# Patient Record
Sex: Female | Born: 1950
Health system: Southern US, Community
[De-identification: ages and names within clinical notes are randomized; demographics above are authoritative.]

## PROBLEM LIST (undated history)

## (undated) DIAGNOSIS — K429 Umbilical hernia without obstruction or gangrene: Secondary | ICD-10-CM

## (undated) DIAGNOSIS — M21372 Foot drop, left foot: Secondary | ICD-10-CM

## (undated) DIAGNOSIS — I671 Cerebral aneurysm, nonruptured: Secondary | ICD-10-CM

## (undated) DIAGNOSIS — Z5189 Encounter for other specified aftercare: Secondary | ICD-10-CM

## (undated) DIAGNOSIS — M1990 Unspecified osteoarthritis, unspecified site: Secondary | ICD-10-CM

## (undated) DIAGNOSIS — Z992 Dependence on renal dialysis: Secondary | ICD-10-CM

## (undated) DIAGNOSIS — K559 Vascular disorder of intestine, unspecified: Secondary | ICD-10-CM

## (undated) DIAGNOSIS — I350 Nonrheumatic aortic (valve) stenosis: Secondary | ICD-10-CM

## (undated) DIAGNOSIS — R34 Anuria and oliguria: Secondary | ICD-10-CM

## (undated) DIAGNOSIS — M48061 Spinal stenosis, lumbar region without neurogenic claudication: Secondary | ICD-10-CM

## (undated) DIAGNOSIS — B1920 Unspecified viral hepatitis C without hepatic coma: Secondary | ICD-10-CM

## (undated) DIAGNOSIS — D649 Anemia, unspecified: Secondary | ICD-10-CM

## (undated) DIAGNOSIS — R569 Unspecified convulsions: Secondary | ICD-10-CM

## (undated) DIAGNOSIS — N186 End stage renal disease: Secondary | ICD-10-CM

## (undated) DIAGNOSIS — I499 Cardiac arrhythmia, unspecified: Secondary | ICD-10-CM

## (undated) DIAGNOSIS — I10 Essential (primary) hypertension: Secondary | ICD-10-CM

## (undated) DIAGNOSIS — R011 Cardiac murmur, unspecified: Secondary | ICD-10-CM

## (undated) DIAGNOSIS — M21371 Foot drop, right foot: Secondary | ICD-10-CM

## (undated) HISTORY — PX: UMBILICAL HERNIA REPAIR: SUR1181

## (undated) HISTORY — DX: Nonrheumatic aortic (valve) stenosis: I35.0

## (undated) HISTORY — PX: VASCULAR SURGERY: SHX849

## (undated) HISTORY — PX: TONSILLECTOMY: SUR1361

## (undated) HISTORY — PX: RIB RESECTION: SHX5077

---
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
## 1988-11-16 DIAGNOSIS — Z5189 Encounter for other specified aftercare: Secondary | ICD-10-CM

## 1988-11-16 DIAGNOSIS — IMO0001 Reserved for inherently not codable concepts without codable children: Secondary | ICD-10-CM

## 1988-11-16 HISTORY — DX: Reserved for inherently not codable concepts without codable children: IMO0001

## 1988-11-16 HISTORY — DX: Encounter for other specified aftercare: Z51.89

## 1997-10-03 ENCOUNTER — Ambulatory Visit (HOSPITAL_COMMUNITY): Admission: RE | Admit: 1997-10-03 | Discharge: 1997-10-03 | Payer: Self-pay | Admitting: Thoracic Surgery

## 1998-02-03 DIAGNOSIS — I340 Nonrheumatic mitral (valve) insufficiency: Secondary | ICD-10-CM | POA: Insufficient documentation

## 1998-03-24 ENCOUNTER — Other Ambulatory Visit: Admission: RE | Admit: 1998-03-24 | Discharge: 1998-03-24 | Payer: Self-pay | Admitting: Nephrology

## 1999-09-06 DIAGNOSIS — M51379 Other intervertebral disc degeneration, lumbosacral region without mention of lumbar back pain or lower extremity pain: Secondary | ICD-10-CM | POA: Insufficient documentation

## 2000-03-03 ENCOUNTER — Encounter: Admission: RE | Admit: 2000-03-03 | Discharge: 2000-03-03 | Payer: Self-pay | Admitting: Nephrology

## 2000-03-03 ENCOUNTER — Encounter: Payer: Self-pay | Admitting: Nephrology

## 2000-03-17 DIAGNOSIS — Z7721 Contact with and (suspected) exposure to potentially hazardous body fluids: Secondary | ICD-10-CM | POA: Insufficient documentation

## 2000-03-19 ENCOUNTER — Encounter: Payer: Self-pay | Admitting: Thoracic Surgery

## 2000-03-19 ENCOUNTER — Encounter (INDEPENDENT_AMBULATORY_CARE_PROVIDER_SITE_OTHER): Payer: Self-pay | Admitting: Specialist

## 2000-03-20 ENCOUNTER — Inpatient Hospital Stay (HOSPITAL_COMMUNITY): Admission: RE | Admit: 2000-03-20 | Discharge: 2000-03-22 | Payer: Self-pay | Admitting: Thoracic Surgery

## 2000-03-25 ENCOUNTER — Emergency Department (HOSPITAL_COMMUNITY): Admission: EM | Admit: 2000-03-25 | Discharge: 2000-03-25 | Payer: Self-pay | Admitting: Emergency Medicine

## 2000-03-25 ENCOUNTER — Ambulatory Visit (HOSPITAL_COMMUNITY): Admission: RE | Admit: 2000-03-25 | Discharge: 2000-03-25 | Payer: Self-pay | Admitting: Nephrology

## 2000-03-26 ENCOUNTER — Encounter: Payer: Self-pay | Admitting: Nephrology

## 2000-03-26 ENCOUNTER — Emergency Department (HOSPITAL_COMMUNITY): Admission: EM | Admit: 2000-03-26 | Discharge: 2000-03-26 | Payer: Self-pay | Admitting: Emergency Medicine

## 2000-03-26 ENCOUNTER — Ambulatory Visit (HOSPITAL_COMMUNITY): Admission: RE | Admit: 2000-03-26 | Discharge: 2000-03-26 | Payer: Self-pay | Admitting: Nephrology

## 2000-05-05 ENCOUNTER — Ambulatory Visit (HOSPITAL_COMMUNITY): Admission: RE | Admit: 2000-05-05 | Discharge: 2000-05-05 | Payer: Self-pay | Admitting: *Deleted

## 2001-01-16 ENCOUNTER — Inpatient Hospital Stay (HOSPITAL_COMMUNITY): Admission: RE | Admit: 2001-01-16 | Discharge: 2001-01-19 | Payer: Self-pay | Admitting: Surgery

## 2001-01-16 ENCOUNTER — Encounter: Payer: Self-pay | Admitting: Surgery

## 2001-01-17 HISTORY — PX: OTHER SURGICAL HISTORY: SHX169

## 2002-06-23 ENCOUNTER — Ambulatory Visit (HOSPITAL_COMMUNITY): Admission: RE | Admit: 2002-06-23 | Discharge: 2002-06-23 | Payer: Self-pay | Admitting: Nephrology

## 2002-06-23 ENCOUNTER — Encounter: Payer: Self-pay | Admitting: Nephrology

## 2003-11-11 ENCOUNTER — Encounter: Admission: RE | Admit: 2003-11-11 | Discharge: 2003-11-11 | Payer: Self-pay | Admitting: Nephrology

## 2004-01-09 ENCOUNTER — Ambulatory Visit (HOSPITAL_COMMUNITY): Admission: RE | Admit: 2004-01-09 | Discharge: 2004-01-09 | Payer: Self-pay | Admitting: Nephrology

## 2004-05-25 ENCOUNTER — Other Ambulatory Visit: Admission: RE | Admit: 2004-05-25 | Discharge: 2004-05-25 | Payer: Self-pay | Admitting: Obstetrics and Gynecology

## 2004-10-24 ENCOUNTER — Ambulatory Visit (HOSPITAL_COMMUNITY): Admission: RE | Admit: 2004-10-24 | Discharge: 2004-10-24 | Payer: Self-pay | Admitting: Nephrology

## 2004-11-23 ENCOUNTER — Ambulatory Visit (HOSPITAL_COMMUNITY): Admission: RE | Admit: 2004-11-23 | Discharge: 2004-11-23 | Payer: Self-pay | Admitting: Thoracic Surgery

## 2004-12-17 ENCOUNTER — Ambulatory Visit (HOSPITAL_COMMUNITY): Admission: RE | Admit: 2004-12-17 | Discharge: 2004-12-17 | Payer: Self-pay | Admitting: Vascular Surgery

## 2005-11-15 ENCOUNTER — Ambulatory Visit (HOSPITAL_COMMUNITY): Admission: RE | Admit: 2005-11-15 | Discharge: 2005-11-15 | Payer: Self-pay | Admitting: Nephrology

## 2005-11-20 ENCOUNTER — Ambulatory Visit (HOSPITAL_COMMUNITY): Admission: RE | Admit: 2005-11-20 | Discharge: 2005-11-20 | Payer: Self-pay | Admitting: Nephrology

## 2006-02-19 DIAGNOSIS — E119 Type 2 diabetes mellitus without complications: Secondary | ICD-10-CM | POA: Insufficient documentation

## 2006-08-08 ENCOUNTER — Encounter: Admission: RE | Admit: 2006-08-08 | Discharge: 2006-08-08 | Payer: Self-pay | Admitting: Nephrology

## 2007-03-27 ENCOUNTER — Encounter: Admission: RE | Admit: 2007-03-27 | Discharge: 2007-04-24 | Payer: Self-pay | Admitting: Nephrology

## 2007-04-25 ENCOUNTER — Emergency Department (HOSPITAL_COMMUNITY): Admission: EM | Admit: 2007-04-25 | Discharge: 2007-04-25 | Payer: Self-pay | Admitting: Emergency Medicine

## 2007-09-21 ENCOUNTER — Ambulatory Visit (HOSPITAL_COMMUNITY): Admission: RE | Admit: 2007-09-21 | Discharge: 2007-09-21 | Payer: Self-pay | Admitting: Nephrology

## 2008-03-18 HISTORY — PX: NEPHRECTOMY: SHX65

## 2008-06-21 DIAGNOSIS — Z905 Acquired absence of kidney: Secondary | ICD-10-CM | POA: Insufficient documentation

## 2009-03-17 DIAGNOSIS — Z8701 Personal history of pneumonia (recurrent): Secondary | ICD-10-CM | POA: Insufficient documentation

## 2009-06-30 ENCOUNTER — Ambulatory Visit: Payer: Self-pay | Admitting: Vascular Surgery

## 2009-08-25 ENCOUNTER — Ambulatory Visit: Payer: Self-pay | Admitting: Vascular Surgery

## 2009-09-04 ENCOUNTER — Encounter: Payer: Self-pay | Admitting: Cardiology

## 2009-09-27 ENCOUNTER — Encounter: Payer: Self-pay | Admitting: Cardiology

## 2010-04-08 ENCOUNTER — Encounter: Payer: Self-pay | Admitting: Obstetrics and Gynecology

## 2010-04-08 ENCOUNTER — Encounter: Payer: Self-pay | Admitting: Nephrology

## 2010-07-31 NOTE — Consult Note (Signed)
NEW PATIENT CONSULTATION   [name], [name]  DOB:  1950-11-19                                       08/25/2009  [id number]   The patient presents today for evaluation of potential claudication for  bilateral lower extremity arterial insufficiency.  She is a very  pleasant 60 year old black female with a 20-year history of end-stage  renal disease on hemodialysis.  She has a right upper arm AV graft that  has been functional for many years.  She reports pain in mainly her left  calf extending occasionally up into her thigh.  She reports this is  continuous and is not related to walking.  She does not have any tissue  loss or rest pain.   PAST MEDICAL HISTORY:  Significant for end-stage renal disease.  She  denies any prior cardiac difficulties.   SOCIAL HISTORY:  She is married with two children.  She does not smoke,  having quit in March 2011.  She does not drink alcohol.   Past medical history is well documented in her problem list through  [facility] and I have reviewed this with her.   REVIEW OF SYSTEMS:  Positive for pain in her legs with walking and  sitting still, arthritis, otherwise negative.   PHYSICAL EXAMINATION:  Blood pressure  98.62, pulse 102, respirations  18,  her temperature is 98.0.  She is in no acute distress.  HEENT:  Normal.  Chest:  Clear bilateral.  Heart:  Regular rate and rhythm.  Her  radial pulses are 2+.  She does have a good thrill her right upper arm  AV graft.  She has palpable femoral pulses, palpable popliteal and she  does have palpable 1 to 2+ posterior tibial pulses bilaterally. Her feet  are well-perfused and warm with no evidence of tissue.   She had undergone noninvasive vasculature laboratory studies in our  office on 06/30/2009.  This showed calcified vessels but does have  biphasic waveforms.   I discussed this at length with the patient.  I do not feel she has any  evidence of lower  extremity arterial insufficiency.  She does have known  degenerative disk disease and this may be the cause of her lower  extremity discomfort.  She is reassured of this and will see [place]  again  on an as-needed basis.     Rosetta Posner, M.D.  Electronically Signed   TFE/MEDQ  D:  08/25/2009  T:  08/28/2009  Job:  [id number]   cc:   Donato Heinz, M.D.

## 2010-08-03 NOTE — Op Note (Signed)
[place]. [facility]  Patient:    [name], [name]                     MRN: [id number] Proc. Date: 03/19/00 Adm. Date:  [id number] Attending:  Mollie Germany CC:         Pierre Bali, M.D. (2)  Joyice Faster. Deterding, M.D.   Operative Report  PREOPERATIVE DIAGNOSIS:  Right upper arm graft with a false aneurysm of the graft.  POSTOPERATIVE DIAGNOSIS:  Right upper arm graft with a false aneurysm of graft.  OPERATION: 1. Excision of false aneurysm, and insertion of a 6.0 mm Gore-Tex segment. 2. Insertion of a left subclavian dialysis catheter.  SURGEON:  D. Marlyn Corporal, M.D.  ASSISTANT:  Lestine Box, C.R.N.F.A.  ANESTHESIA:  Xylocaine 1% and IV sedation.  DESCRIPTION OF PROCEDURE:  After prepping and draping the right arm an area was infiltrated with 1% Xylocaine above and below the right upper arm aneurysm.  The patient had two aneurysms, one 2.0 cm x 3.0 cm, and the other one 1.5 cm x 3.0 cm x 3.0 cm in length.  They were in the midportion of the graft.  An area was infiltrated with 1% Xylocaine proximal to the aneurysm and another area distal to the aneurysm.  A longitudinal incision is made and the dissection is carried down through the subcutaneous tissue to the graft proximal to the aneurysm.  It was looped with a vascular tape, and distally it was also dissected down to the graft and looped with the vascular tape.  Then the aneurysms were dissected out both proximally and distally, dissecting both aneurysms out from the subcutaneous tissue.  The patient was given 5000 units of heparin.  The graft was clamped proximally and distally.  The graft was divided tangentially proximally and distally, and then the aneurysm was resected out from the subcutaneous tissue.  After this had been done, a 6.0 mm Gore-Tex graft was inserted into the track just inferior to the previous track, and was sutured end-to-end with #5-0 Prolene in a  running continuous fashion both proximally and distally, and tying down superiorly.  All clamps were removed, and there was good flow through the graft.  Some excess skin that was excised, and then the graft was closed with running #3-0 Vicryl and interrupted #2-0 nylon.  The left chest was then prepped and draped in the usual manner.  An area was infiltrated with 1% Xylocaine at the left subclavian, and a left subclavian puncture was performed, and a guide wire was threaded.  Over the guide wire was passed a dilator, a 12-French, and then over the guide wire was passed the 12-French dialysis catheter.  It was sutured in place with #3-0 silk, and a dressing was applied.  It was flushed with heparin.  The patient was then returned to the recovery room in stable condition. DD:  03/19/00 TD:  03/19/00 Job: [id number]

## 2010-08-03 NOTE — Discharge Summary (Signed)
[place]. [facility]  Patient:    [name], [name]                     MRN: [id number] Adm. Date:  [id number] Disc. Date: [id number] Attending:  Mollie Germany Dictator:   Lestine Box, RNFA                           Discharge Summary  DATE OF BIRTH:  May 27, 1950  DATE OF SURGERY:  March 19, 2000  ADMISSION DIAGNOSIS:  Aneurysmal arteriovenous Gore-Tex graft, right upper arm.  PAST MEDICAL HISTORY: 1. End-stage renal disease:  She has hemodialysis on a Tuesday, Thursday,    Saturday schedule. 2. Hypertension.  ALLERGIES:  CONTRAST DYE causes seizures.  DISCHARGE DIAGNOSES: 1. Aneurysmal arteriovenous Gore-Tex graft, right upper arm. 2. Postoperative bleeding. 3. Postoperative hypertension.  HOSPITAL COURSE:  On March 19, 2000, [name] came to [facility] for her scheduled revision of her right upper arm AV Gore-Tex graft. She has an excision of the aneurysmal segment and revision of the graft with a 6-mm Gore-Tex graft by Dr. Pierre Bali.  At the conclusion of the procedure she was transferred in stable condition to the PACU. Postoperatively, she developed bleeding at the operative site.  Pressure dressing was applied with resolution of bleeding.  She was admitted to [facility] for observation overnight.  She had no further bleeding, and the right AV Gore-Tex graft remains patent.  On January 3, prior to planned discharge, she was noted to be orthostatic in her blood pressure DD:  03/21/00 TD:  03/21/00 Job: [id number]

## 2010-08-03 NOTE — Discharge Summary (Signed)
. [facility]  Patient:    ARDALIA, NEUMEISTER                     MRN: [id number] Adm. Date:  [id number] Disc. Date: [id number] Attending:  Mollie Germany Dictator:   Lestine Box, RNFA CC:         Darrold Span. Florene Glen, M.D.  [facility]   Discharge Summary  DATE OF BIRTH:  May 19, 1950  DATE OF SURGERY:  March 19, 2000  ADMISSION DIAGNOSIS:  Aneurysmal arteriovenous Gore-Tex graft, right upper arm.  PAST MEDICAL HISTORY: 1. End-stage renal disease:  She has hemodialysis on a Tuesday, Thursday,    Saturday schedule. 2. Hypertension.  ALLERGIES:  CONTRAST DYE causes seizures.  DISCHARGE DIAGNOSES: 1. Aneurysmal arteriovenous Gore-Tex graft, right upper arm. 2. Postoperative bleeding. 3. Postoperative hypertension.  HOSPITAL COURSE:  On March 19, 2000, Ms. Malave came to [facility] for a planned revision of her right upper arm AV graft.  She underwent an excision of the aneurysmal segment and revision of the graft with a piece of 6-mm Gore-Tex graft by Dr. Pierre Bali.  At the conclusion of the procedure she was transferred in stable condition to the PACU. Postoperatively, she developed some bleeding at the operative site.  Pressure dressing was applied with resolution.  She was admitted to [facility] overnight for observation.  She had no further bleeding from the operative site and her right AV Gore-Tex graft remained patent.  On the morning of January 3 prior to her planned discharge, she was noted to be orthostatic with orthostatic hypertension.  Her hemoglobin was measured at 8.8 that was down from 11.0 preoperatively.  Her blood pressure meds were held for the day, and she was given a 250 cc IV administration of normal saline.  Her blood pressure improved after the saline administration and has remained in the 100-110/62-68 range.  Today, January 4, she is currently at  hemodialysis.  If she remains nonorthostatic, she will be discharged home after her hemodialysis treatment.   CONDITION ON DISCHARGE:  Her condition is improved.  DISCHARGE INSTRUCTIONS:  Ms. Penado has been instructed regarding activity, diet, medications, follow-up, and wound care.  MEDICATIONS ON DISCHARGE: 1. She has been instructed to resume her home medications. 2. She has also been given a prescription for Tylox that she can take one to    two p.o. q.4-6h. p.r.n. pain.  DISCHARGE FOLLOWUP: 1. She is to resume her usual hemodialysis treatments. 2. She also has an appointment to see Dr. Arlyce Dice at the [place] office on    January 9 at 2 p.m. DD:  03/21/00 TD:  03/21/00 Job: [id number]

## 2010-08-03 NOTE — Op Note (Signed)
[name], [name]              ACCOUNT NO.:  [id number]   MEDICAL RECORD NO.:  [id number]          PATIENT TYPE:  AMB   LOCATION:  SDS                          FACILITY:  [facility]   PHYSICIAN:  Nicanor Alcon, M.D. DATE OF BIRTH:  [birth year]   DATE OF PROCEDURE:  11/23/2004  DATE OF DISCHARGE:                                 OPERATIVE REPORT   PREOPERATIVE DIAGNOSES:  1.  End-stage renal disease.  2.  Aneurysms.  3.  Right upper arm arteriovenous Gore-Tex graft.   POSTOPERATIVE DIAGNOSES:  1.  End-stage renal disease.  2.  Aneurysms.  3.  Right upper arm arteriovenous Gore-Tex graft.   OPERATION/PROCEDURE:  1.  Insertion of left internal jugular catheter.  2.  Exclusion of right upper arm graft arteriovenous aneurysms with revision      of right upper arm arteriovenous Gore-Tex graft.   SURGEON:  Nicanor Alcon, M.D.   FIRST ASSISTANT:  Remo Lipps, R.N.F.A.   DESCRIPTION OF PROCEDURE:  After adequate general anesthesia, the patient  was prepped and draped in the usual sterile manner.  A left IJ puncture was  performed and a guidewire threaded under fluoroscopic guidance to the right  atrium.  Over the guidewire was passed 12, 14 and 16 dilators.  The 16  dilator had a peel-away sheath but we could not insert the peel-away sheath  more than about 2-3 cm into the vein.  The dilator was removed and the  guidewire was weaved through the Diatek to use a guide.  The Diatek was  inserted through the peel-away sheath and inserted down to the right atrium.  The guidewire was removed.  The peel-away sheath was removed and it flushed  and withdrew easily.  It was then another stab wound was made inferiorly.  We had to use a 32 cm catheter so we had to do a long tunnel.  The long  tunnel was done with the cuff being about halfway in between and then it was  brought out through the separate stab wound and the connecting device  applied to the catheter.  The catheter was  flushed appropriately.  The  catheter was sutured in place with 3-0 nylon, both at the suture cuff and  then at the proximal stab wound was sutured.   Next, the right arm was prepped and draped in the usual sterile manner.  An  incision was made near the arterial end and the graft was dissected out.  The patient had multiple aneurysms in the mid portion of the graft.  The  graft was then looped with the vascular tape and another incision was made  at the venous end.  The venous end was dissected out and looped with the  vascular tape.  The patient was given 4000 units of heparin.  The proximal  graft was then clamped, cut tangentially and about 2-3 cm resected draining  all the blood out of the aneurysm.  A 20 cm 6 mm graft had been tunneled  around the aneurysms prior to giving the heparin and this was cut  tangentially and sutured end-to-side with  6-0 Prolene in a running  continuous fashion.  All clamps were removed and there was good flow.  There  was good pulsation in the graft. The  graft was clamped distally, measured  appropriately distally.  Distally the old graft was divided and about 3 cm  resected, and then an end-to-end anastomosis was performed between the new  graft and the old graft with 6-0 Prolene in a running continuous fashion.  Clamps were removed.  There was good flow through the graft. Wounds were  closed with 3-0 Vicryl and Dermabond for the skin.  The patient was returned  to the recovery room in stable condition.           ______________________________  Nicanor Alcon, M.D.     DPB/MEDQ  D:  11/23/2004  T:  11/23/2004  Job:  [id number]

## 2010-08-03 NOTE — Discharge Summary (Signed)
[place]. [facility]  Patient:    [name], [name] Visit Number: [id number] MRN: [id number]          Service Type: SUR Location: [id number] Attending Physician:  Gomez Cleverly Dictated by:   Earnstine Regal, M.D. Admit Date:  01/16/2001 Discharge Date: 01/19/2001   CC:         [name], M.D.   Discharge Summary  REASON FOR ADMISSION:  Secondary hyperparathyroidism due to end-stage renal disease.  HISTORY OF PRESENT ILLNESS:  The patient is a 60 year old, African-American female with secondary hyperparathyroidism resulting in bone and joint pain due to underlying end-stage renal disease from hypertensive nephrosclerosis.  The patient has been on hemodialysis since 1991.  The patient has had significant elevation of her parathyroid hormone levels.  Recently, this was documented at over 1200.  The patient now comes to surgery for parathyroidectomy.  HOSPITAL COURSE:  The patient was admitted on January 16, 2001.  She was taken directly to the operating room where she underwent total parathyroidectomy with auto transplantation of autologous tissue to the left forearm. Postoperative course was relatively straight forward.  She was managed in conjunction with the renal medicine service.  The patients calcium levels initially fell from 10.6 to 9.1 to 7.4 to 7.8.  She was repleted with intravenous calcium, oral calcium and high calcium baths with hemodialysis. The patient was started on Calcijex.  The patient stabilized with calcium levels in the range of 7.7.  She was prepared for discharge home on January 19, 2001.  DISPOSITION:  Discharged home on January 19, 2001.  FOLLOWUP:  The patient will be seen back at Dr. Tera Helper office at [facility] in one week for wound check and suture removal.  The patient will continued to be followed closely by the renal medicine service with dialysis three days weekly.  DISCHARGE MEDICATIONS:   Documented per the renal medicine service.  DISCHARGE DIAGNOSIS:  End-stage renal disease with resultant secondary hyperparathyroidism.  CONDITION ON DISCHARGE:  Good. Dictated by:   Earnstine Regal, M.D. Attending Physician:  Gomez Cleverly DD:  02/01/01 TD:  02/02/01 Job: [id number]

## 2010-08-03 NOTE — Op Note (Signed)
[place]. [facility]  Patient:    [name], [name] Visit Number: [id number] MRN: [id number]          Service Type: SUR Location: [id number] Attending Physician:  Gomez Cleverly Dictated by:   Earnstine Regal, M.D. Proc. Date: 01/16/01 Admit Date:  01/16/2001   CC:         [name], M.D.  [facility]   Operative Report  PREOPERATIVE DIAGNOSES:  End-stage renal disease secondary to hypertension, secondary hyperparathyroidism.  POSTOPERATIVE DIAGNOSES:  End-stage renal disease secondary to hypertension, secondary hyperparathyroidism.  PROCEDURE:  Total parathyroidectomy with autotransplantation to left forearm.  SURGEON:  Earnstine Regal, M.D.  ASSISTANT:  Sammuel Hines. Daiva Nakayama, M.D.  ANESTHESIA:  General per Dr. Tedra Senegal.  ESTIMATED BLOOD LOSS:  Minimal.  PREPARATION:  Betadine.  COMPLICATIONS:  None.  INDICATIONS:  The patient is a 60 year old black female who presents at the request of Dr. Salem Senate for secondary hyperparathyroidism.  The patient has developed bone and joint pain.  The patient has failed medical management for secondary hyperparathyroidism.  Most recent PTH level was 819 with a July 2002 level of 1286.  The patient now comes to surgery for total parathyroidectomy for treatment of secondary hyperparathyroidism with autotransplantation to the left forearm.  DESCRIPTION OF PROCEDURE:  The procedure is done in OR #17 at [place].  The patient is brought to the operating room and placed in a supine position on the operating room table.  Following the administration of general anesthesia, the patient is positioned and then prepped and draped in the usual strict aseptic fashion.  After ascertaining that adequate level of anesthesia had been obtained, the neck is incised with a standard Kocher incision.  Dissection is carried down through the subcutaneous tissue and platysma.   Hemostasis is obtained with the electrocautery.  Strap muscles are incised in the midline and reflected laterally.  Dissection is begun on the left side.  Thyroid lobe appears grossly normal.  It is gently mobilized from its areolar tissue.  The gland is rolled anteriorly and medially.  In the tracheoesophageal groove, both the inferior and superior parathyroid glands are immediately identified.  The inferior parathyroid gland is relatively small, measuring approximately 1.4 cm in greatest dimension.  It is carefully dissected out.  It is free from the thyroid tissue.  Care is used to avoid injury to nerves or vascular structures.  The gland is dissected off of the capsule of the thyroid and isolated on a vascular pedicle which is divided between small LigaClips.  On the cutting board, the left inferior gland is measured as 1.4- x 0.8- x 0.5-cm in size.  Frozen section is submitted to pathology.  Dr. Concha Norway confirms parathyroid tissue.  The remainder of the gland is placed on iced saline for autotransplantation.  Continuing in the left neck, the left superior gland is identified easily on further exploration.  It is just superior to the inferior thyroid artery.  It is again mobilized off of the thyroid capsule using the electrocautery for hemostasis.  The gland was isolated on a vascular pedicle which was divided between small LigaClips.  X-Vivo, the gland measures 1.2- x 0.6- x 0.4-cm in size.  A fragment is submitted to frozen section diagnosis which confirms parathyroid tissue.  The remaining gland is placed on iced saline for autotransplantation purposes.  Pack is placed in the left neck.  Dissection is then begun on the  right side.  Again the thyroid lobe is mobilized from the strap muscles.  It is gently dissected away.  The right thyroid lobe is rolled medially.  In the tracheoesophageal groove, two much larger parathyroid glands are isolated.  The inferior gland is resected  first.  It is mobilized from the thyroid capsule with hemostasis obtained with the electrocautery.  Again, it is isolated on a vascular pedicle which is divided between small LigaClips. X-Vivo, the right inferior gland measures 2.0- x 1.2- x 0.7-cm in size. Frozen section analysis of the small fragment of the gland confirms parathyroid tissue.  The remaining gland is placed on iced saline.  Finally, the right superior gland is isolated above the level of the inferior thyroid artery on the right side of the neck.  It is dissected off the thyroid capsule.  Hemostasis is obtained with the electrocautery.  The gland is isolated on a vascular pedicle which is divided between small LigaClips. X-Vivo, the gland measures 1.5- x 1.0- x 0.7-cm in size.  The fragment of the gland is submitted for frozen section which confirms parathyroid tissue.  The remainder of the gland is placed on iced saline.  Both sides of the neck are irrigated copiously with warm saline.  Good hemostasis is noted.  The thyroid is returned to its normal anatomic location.  Strap muscles are reapproximated in the midline with interrupted 3-0 Vicryl sutures.  Platysma is reapproximated with interrupted 3-0 Vicryl sutures.  Skin is closed with a running 4-0 Prolene subcuticular suture.  The wound is washed and dried and Benzoin and Steri-Strips are applied.  Sterile gauze dressings are applied. The sterile field was removed.  Drapes were removed.  The left arm is then prepped and draped in the usual strict aseptic fashion. After ascertaining that an adequate level of anesthesia had been maintained, an incision is made over the brachioradialis muscle of the left forearm.  This was made with a #10 blade for approximately 5 cm.  The subcutaneous tissues are divided with the electrocautery.  Subcutaneous flaps are raised circumferentially exposing the brachioradialis muscle and overlying fascia.  The left inferior parathyroid gland  and left superior parathyroid gland are then sectioned into approximately 1-mm tubes of viable parathyroid tissue. Twelve specimens are developed.  Twelve specimens are then implanted into the brachioradialis muscle through puncture wounds and secured with interrupted 4-0 Prolene sutures.  The grid is four specimens wide by three rows.  Good hemostasis is noted.  The subcutaneous tissues were reapproximated with interrupted 3-0 Vicryl sutures.  The skin was closed with stainless steel staples.  The wound was washed and dried.  The 4x4 gauze followed by Kerlix followed by Ace wrap are applied as dressing.  The patient was awakened from anesthesia and brought to the recovery room in stable condition.  The patient tolerated this procedure well. Dictated by:   Earnstine Regal, M.D. Attending Physician:  Gomez Cleverly DD:  01/16/01 TD:  01/18/01 Job: [id number]

## 2010-10-18 ENCOUNTER — Other Ambulatory Visit: Payer: Self-pay | Admitting: Nephrology

## 2010-10-18 DIAGNOSIS — M549 Dorsalgia, unspecified: Secondary | ICD-10-CM

## 2010-10-22 ENCOUNTER — Other Ambulatory Visit: Payer: Self-pay

## 2010-10-30 ENCOUNTER — Other Ambulatory Visit: Payer: Self-pay | Admitting: Neurosurgery

## 2010-10-30 DIAGNOSIS — M5126 Other intervertebral disc displacement, lumbar region: Secondary | ICD-10-CM

## 2010-10-30 DIAGNOSIS — M4310 Spondylolisthesis, site unspecified: Secondary | ICD-10-CM

## 2010-11-07 ENCOUNTER — Ambulatory Visit
Admission: RE | Admit: 2010-11-07 | Discharge: 2010-11-07 | Disposition: A | Discharge status: Home / Self Care | Payer: Medicare Other | Source: Ambulatory Visit | Attending: Neurosurgery | Admitting: Neurosurgery

## 2010-11-07 DIAGNOSIS — M4310 Spondylolisthesis, site unspecified: Secondary | ICD-10-CM

## 2010-11-07 DIAGNOSIS — M5126 Other intervertebral disc displacement, lumbar region: Secondary | ICD-10-CM

## 2011-01-17 DIAGNOSIS — K559 Vascular disorder of intestine, unspecified: Secondary | ICD-10-CM

## 2011-01-17 HISTORY — DX: Vascular disorder of intestine, unspecified: K55.9

## 2011-01-23 ENCOUNTER — Other Ambulatory Visit: Payer: Self-pay | Admitting: Neurosurgery

## 2011-01-24 ENCOUNTER — Encounter (HOSPITAL_COMMUNITY): Payer: Self-pay | Admitting: Pharmacy Technician

## 2011-01-25 ENCOUNTER — Encounter (HOSPITAL_COMMUNITY): Payer: Self-pay

## 2011-01-25 ENCOUNTER — Other Ambulatory Visit: Payer: Self-pay

## 2011-01-25 ENCOUNTER — Ambulatory Visit (HOSPITAL_COMMUNITY)
Admission: RE | Admit: 2011-01-25 | Discharge: 2011-01-25 | Disposition: A | Discharge status: Home / Self Care | Payer: Medicare Other | Source: Ambulatory Visit | Attending: Anesthesiology | Admitting: Anesthesiology

## 2011-01-25 ENCOUNTER — Encounter (HOSPITAL_COMMUNITY)
Admission: RE | Admit: 2011-01-25 | Discharge: 2011-01-25 | Disposition: A | Discharge status: Home / Self Care | Payer: Medicare Other | Source: Ambulatory Visit | Attending: Neurosurgery | Admitting: Neurosurgery

## 2011-01-25 DIAGNOSIS — Z01812 Encounter for preprocedural laboratory examination: Secondary | ICD-10-CM | POA: Insufficient documentation

## 2011-01-25 DIAGNOSIS — Z01810 Encounter for preprocedural cardiovascular examination: Secondary | ICD-10-CM | POA: Insufficient documentation

## 2011-01-25 DIAGNOSIS — Z01818 Encounter for other preprocedural examination: Secondary | ICD-10-CM | POA: Insufficient documentation

## 2011-01-25 HISTORY — DX: Encounter for other specified aftercare: Z51.89

## 2011-01-25 HISTORY — DX: Cerebral aneurysm, nonruptured: I67.1

## 2011-01-25 HISTORY — DX: Cardiac murmur, unspecified: R01.1

## 2011-01-25 HISTORY — DX: Anemia, unspecified: D64.9

## 2011-01-25 HISTORY — DX: Essential (primary) hypertension: I10

## 2011-01-25 HISTORY — DX: Dependence on renal dialysis: Z99.2

## 2011-01-25 HISTORY — DX: Unspecified convulsions: R56.9

## 2011-01-25 HISTORY — DX: Umbilical hernia without obstruction or gangrene: K42.9

## 2011-01-25 HISTORY — DX: End stage renal disease: N18.6

## 2011-01-25 HISTORY — DX: Unspecified osteoarthritis, unspecified site: M19.90

## 2011-01-25 LAB — BASIC METABOLIC PANEL
Creatinine, Ser: 7.06 mg/dL — ABNORMAL HIGH (ref 0.50–1.10)
GFR calc Af Amer: 7 mL/min — ABNORMAL LOW (ref >90)
GFR calc non Af Amer: 6 mL/min — ABNORMAL LOW (ref >90)
Glucose, Bld: 85 mg/dL (ref 70–99)
Sodium: 140 mEq/L (ref 135–145)

## 2011-01-25 LAB — CBC
Platelets: 260 10*3/uL (ref 150–400)
RDW: 15.2 % (ref 11.5–15.5)
WBC: 5.8 10*3/uL (ref 4.0–10.5)

## 2011-01-25 LAB — SURGICAL PCR SCREEN
MRSA, PCR: NEGATIVE
Staphylococcus aureus: NEGATIVE

## 2011-01-25 LAB — ABO/RH: ABO/RH(D): B POS

## 2011-01-25 NOTE — Pre-Procedure Instructions (Signed)
[place]  01/25/2011   Your procedure is scheduled on:  Wednesday January 27, 2011  Report to [place] at 1125 AM.  Call this number if you have problems the morning of surgery: [phone]   Remember:   Do not eat food:After Midnight.  Do not drink clear liquids: 4 Hours before arrival.  Take these medicines the morning of surgery with A SIP OF WATER: Metoprolol   Do not wear jewelry, make-up or nail polish.  Do not wear lotions, powders, or perfumes. You may wear deodorant.  Do not shave 48 hours prior to surgery.  Do not bring valuables to the hospital.  Contacts, dentures or bridgework may not be worn into surgery.  Leave suitcase in the car. After surgery it may be brought to your room.  For patients admitted to the hospital, checkout time is 11:00 AM the day of discharge.   Patients discharged the day of surgery will not be allowed to drive home.  Name and phone number of your driver: Ruben Reason  Special Instructions: CHG Shower Use Special Wash: 1/2 bottle night before surgery and 1/2 bottle morning of surgery.   Please read over the following fact sheets that you were given: Pain Booklet, Coughing and Deep Breathing, Blood Transfusion Information, MRSA Information and Surgical Site Infection Prevention

## 2011-01-29 ENCOUNTER — Encounter (HOSPITAL_COMMUNITY): Payer: Self-pay | Admitting: Surgery

## 2011-01-29 MED ORDER — CEFAZOLIN SODIUM 1-5 GM-% IV SOLN
1.0000 g | INTRAVENOUS | Status: AC
  Administered 2011-01-30 (×2): 1 g via INTRAVENOUS
  Filled 2011-01-29: qty 50

## 2011-01-29 NOTE — Consult Note (Signed)
Anesthesia:  60 year old female for PLIF 01/30/11.  Hx + ESRD on HD TTS, HTN, murmur, hx rib resection, nephrectomy, brain aneurysm, prior seizure in the 1990, and anemia.  Labs noted.  Will need ISTAT DOS.  CXR also reviewed.  Report shows hyperinflation, atx or fibrosis r base, but no pneumonia, edema, or effusion.  Sats 99% at PAT.  EKG noted.  Overall appears stable since 09/04/09 (from [facility]).  She had a Negative dobutamine stress echocardiography on 09/27/09 at [facility] showing no inducible ischemia, mild AS, mild-mod TR.  Plan to proceed in no acute CV symptoms and if ISTAT results acceptable.

## 2011-01-29 NOTE — Anesthesia Preprocedure Evaluation (Addendum)
Anesthesia Evaluation  Patient identified by MRN, date of birth, ID band Patient awake    Reviewed: Allergy & Precautions, H&P , NPO status , Patient's Chart, lab work & pertinent test results  Airway Mallampati: I TM Distance: >3 FB Neck ROM: full    Dental  (+) Teeth Intact   Pulmonary neg pulmonary ROS,    Pulmonary exam normal       Cardiovascular hypertension, + Valvular Problems/Murmurs regular Normal Neg stress echo 09/2009 with EF 55%, mild AS, mild-mod TR.   Neuro/Psych Seizures -,  Negative Neurological ROS     GI/Hepatic negative GI ROS, Neg liver ROS,   Endo/Other  Diabetes mellitus-  Renal/GU CRF, ESRF and DialysisRenal disease  Genitourinary negative   Musculoskeletal negative musculoskeletal ROS (+)   Abdominal   Peds  Hematology negative hematology ROS (+)   Anesthesia Other Findings   Reproductive/Obstetrics negative OB ROS                          Anesthesia Physical Anesthesia Plan  ASA: III  Anesthesia Plan: General   Post-op Pain Management:    Induction: Intravenous  Airway Management Planned: Oral ETT  Additional Equipment:   Intra-op Plan:   Post-operative Plan: Extubation in OR  Informed Consent: I have reviewed the patients History and Physical, chart, labs and discussed the procedure including the risks, benefits and alternatives for the proposed anesthesia with the patient or authorized representative who has indicated his/her understanding and acceptance.   Dental advisory given  Plan Discussed with: Anesthesiologist, CRNA and Surgeon  Anesthesia Plan Comments:         Anesthesia Quick Evaluation

## 2011-01-30 ENCOUNTER — Inpatient Hospital Stay (HOSPITAL_COMMUNITY): Payer: Medicare Other

## 2011-01-30 ENCOUNTER — Encounter (HOSPITAL_COMMUNITY): Payer: Self-pay | Admitting: Nephrology

## 2011-01-30 ENCOUNTER — Encounter (HOSPITAL_COMMUNITY): Payer: Self-pay | Admitting: Vascular Surgery

## 2011-01-30 ENCOUNTER — Inpatient Hospital Stay (HOSPITAL_COMMUNITY)
Admission: RE | Admit: 2011-01-30 | Discharge: 2011-02-01 | DRG: 459 | Disposition: A | Discharge status: Rehabilitation Facility | Payer: Medicare Other | Source: Ambulatory Visit | Attending: Neurosurgery | Admitting: Neurosurgery

## 2011-01-30 ENCOUNTER — Encounter (HOSPITAL_COMMUNITY): Payer: Self-pay | Admitting: *Deleted

## 2011-01-30 ENCOUNTER — Encounter (HOSPITAL_COMMUNITY)
Admission: RE | Disposition: A | Discharge status: Rehabilitation Facility | Payer: Self-pay | Source: Ambulatory Visit | Attending: Neurosurgery

## 2011-01-30 ENCOUNTER — Inpatient Hospital Stay (HOSPITAL_COMMUNITY): Payer: Medicare Other | Admitting: Vascular Surgery

## 2011-01-30 DIAGNOSIS — Z992 Dependence on renal dialysis: Secondary | ICD-10-CM

## 2011-01-30 DIAGNOSIS — I10 Essential (primary) hypertension: Secondary | ICD-10-CM

## 2011-01-30 DIAGNOSIS — N2581 Secondary hyperparathyroidism of renal origin: Secondary | ICD-10-CM | POA: Diagnosis present

## 2011-01-30 DIAGNOSIS — N186 End stage renal disease: Secondary | ICD-10-CM

## 2011-01-30 DIAGNOSIS — M216X9 Other acquired deformities of unspecified foot: Secondary | ICD-10-CM | POA: Diagnosis present

## 2011-01-30 DIAGNOSIS — Z79899 Other long term (current) drug therapy: Secondary | ICD-10-CM

## 2011-01-30 DIAGNOSIS — R768 Other specified abnormal immunological findings in serum: Secondary | ICD-10-CM | POA: Insufficient documentation

## 2011-01-30 DIAGNOSIS — M47817 Spondylosis without myelopathy or radiculopathy, lumbosacral region: Secondary | ICD-10-CM | POA: Diagnosis present

## 2011-01-30 DIAGNOSIS — D500 Iron deficiency anemia secondary to blood loss (chronic): Secondary | ICD-10-CM | POA: Diagnosis not present

## 2011-01-30 DIAGNOSIS — M4316 Spondylolisthesis, lumbar region: Secondary | ICD-10-CM

## 2011-01-30 DIAGNOSIS — E119 Type 2 diabetes mellitus without complications: Secondary | ICD-10-CM | POA: Diagnosis present

## 2011-01-30 DIAGNOSIS — B1921 Unspecified viral hepatitis C with hepatic coma: Secondary | ICD-10-CM | POA: Diagnosis present

## 2011-01-30 DIAGNOSIS — I120 Hypertensive chronic kidney disease with stage 5 chronic kidney disease or end stage renal disease: Secondary | ICD-10-CM | POA: Diagnosis present

## 2011-01-30 DIAGNOSIS — B1920 Unspecified viral hepatitis C without hepatic coma: Secondary | ICD-10-CM | POA: Insufficient documentation

## 2011-01-30 DIAGNOSIS — E875 Hyperkalemia: Secondary | ICD-10-CM | POA: Diagnosis not present

## 2011-01-30 DIAGNOSIS — M48062 Spinal stenosis, lumbar region with neurogenic claudication: Secondary | ICD-10-CM

## 2011-01-30 DIAGNOSIS — M4310 Spondylolisthesis, site unspecified: Principal | ICD-10-CM | POA: Diagnosis present

## 2011-01-30 HISTORY — DX: Unspecified viral hepatitis C without hepatic coma: B19.20

## 2011-01-30 HISTORY — PX: BACK SURGERY: SHX140

## 2011-01-30 LAB — BASIC METABOLIC PANEL
BUN: 42 mg/dL — ABNORMAL HIGH (ref 6–23)
CO2: 24 mEq/L (ref 19–32)
Calcium: 7.2 mg/dL — ABNORMAL LOW (ref 8.4–10.5)
Chloride: 96 mEq/L (ref 96–112)
Creatinine, Ser: 7.74 mg/dL — ABNORMAL HIGH (ref 0.50–1.10)
GFR calc Af Amer: 6 mL/min — ABNORMAL LOW (ref >90)
GFR calc non Af Amer: 5 mL/min — ABNORMAL LOW (ref >90)
Glucose, Bld: 111 mg/dL — ABNORMAL HIGH (ref 70–99)
Potassium: 5.9 mEq/L — ABNORMAL HIGH (ref 3.5–5.1)
Sodium: 138 mEq/L (ref 135–145)

## 2011-01-30 LAB — POCT I-STAT 4, (NA,K, GLUC, HGB,HCT)
Glucose, Bld: 77 mg/dL (ref 70–99)
HCT: 40 % (ref 36.0–46.0)
Hemoglobin: 13.6 g/dL (ref 12.0–15.0)
Potassium: 5 mEq/L (ref 3.5–5.1)
Sodium: 138 mEq/L (ref 135–145)

## 2011-01-30 LAB — CBC
Hemoglobin: 12.5 g/dL (ref 12.0–15.0)
MCHC: 33.8 g/dL (ref 30.0–36.0)
RBC: 4.3 MIL/uL (ref 3.87–5.11)
WBC: 12.2 10*3/uL — ABNORMAL HIGH (ref 4.0–10.5)

## 2011-01-30 SURGERY — POSTERIOR LUMBAR FUSION 2 LEVEL
Anesthesia: General | Site: Back | Wound class: Clean

## 2011-01-30 MED ORDER — SODIUM CHLORIDE 0.9 % IJ SOLN: 9.0000 mL | INTRAMUSCULAR | Status: DC | PRN

## 2011-01-30 MED ORDER — CALCIUM CARBONATE 1250 MG/5ML PO SUSP
500.0000 mg | Freq: Four times a day (QID) | ORAL | Status: DC | PRN
  Filled 2011-01-30: qty 5

## 2011-01-30 MED ORDER — DIPHENHYDRAMINE HCL 12.5 MG/5ML PO ELIX: 12.5000 mg | ORAL_SOLUTION | Freq: Four times a day (QID) | ORAL | Status: DC | PRN

## 2011-01-30 MED ORDER — NEPRO/CARBSTEADY PO LIQD
237.0000 mL | Freq: Three times a day (TID) | ORAL | Status: DC | PRN
  Filled 2011-01-30: qty 237

## 2011-01-30 MED ORDER — PROMETHAZINE HCL 12.5 MG PO TABS
12.5000 mg | ORAL_TABLET | Freq: Four times a day (QID) | ORAL | Status: DC | PRN
  Filled 2011-01-30 (×3): qty 1

## 2011-01-30 MED ORDER — CAMPHOR-MENTHOL 0.5-0.5 % EX LOTN
1.0000 "application " | TOPICAL_LOTION | Freq: Three times a day (TID) | CUTANEOUS | Status: DC | PRN
  Filled 2011-01-30: qty 222

## 2011-01-30 MED ORDER — LIDOCAINE-PRILOCAINE 2.5-2.5 % EX CREA
1.0000 "application " | TOPICAL_CREAM | CUTANEOUS | Status: DC | PRN
  Filled 2011-01-30: qty 5

## 2011-01-30 MED ORDER — DOCUSATE SODIUM 283 MG RE ENEM
1.0000 | ENEMA | RECTAL | Status: DC | PRN
  Filled 2011-01-30: qty 1

## 2011-01-30 MED ORDER — PROMETHAZINE HCL 25 MG RE SUPP: 25.0000 mg | Freq: Two times a day (BID) | RECTAL | Status: DC | PRN

## 2011-01-30 MED ORDER — MORPHINE SULFATE (PF) 1 MG/ML IV SOLN: INTRAVENOUS | Status: DC

## 2011-01-30 MED ORDER — PENTAFLUOROPROP-TETRAFLUOROETH EX AERO
1.0000 "application " | INHALATION_SPRAY | CUTANEOUS | Status: DC | PRN
  Filled 2011-01-30: qty 103.5

## 2011-01-30 MED ORDER — THROMBIN 20000 UNITS EX KIT
PACK | CUTANEOUS | Status: DC | PRN
  Administered 2011-01-30: 20000 [IU] via TOPICAL

## 2011-01-30 MED ORDER — ZOLPIDEM TARTRATE 5 MG PO TABS: 5.0000 mg | ORAL_TABLET | Freq: Every evening | ORAL | Status: DC | PRN

## 2011-01-30 MED ORDER — PHENYLEPHRINE HCL 10 MG/ML IJ SOLN
10.0000 mg | INTRAVENOUS | Status: DC | PRN
  Administered 2011-01-30: 50 ug/min via INTRAVENOUS
  Administered 2011-01-30: 30 ug/min via INTRAVENOUS

## 2011-01-30 MED ORDER — HYDROMORPHONE HCL PF 1 MG/ML IJ SOLN
0.2500 mg | INTRAMUSCULAR | Status: DC | PRN
  Administered 2011-01-30 (×2): 0.5 mg via INTRAVENOUS

## 2011-01-30 MED ORDER — SODIUM CHLORIDE 0.9 % IR SOLN
Status: DC | PRN
  Administered 2011-01-30: 15:00:00

## 2011-01-30 MED ORDER — SODIUM CHLORIDE 0.9 % IR SOLN
Status: DC | PRN
  Administered 2011-01-30 (×2): 1000 mL

## 2011-01-30 MED ORDER — SODIUM CHLORIDE 0.9 % IV SOLN: 100.0000 mL | INTRAVENOUS | Status: DC | PRN

## 2011-01-30 MED ORDER — BUPIVACAINE HCL (PF) 0.5 % IJ SOLN
INTRAMUSCULAR | Status: DC | PRN
  Administered 2011-01-30: 20 mL

## 2011-01-30 MED ORDER — ACETAMINOPHEN 650 MG RE SUPP: 650.0000 mg | Freq: Four times a day (QID) | RECTAL | Status: DC | PRN

## 2011-01-30 MED ORDER — SORBITOL 70 % SOLN
30.0000 mL | Status: DC | PRN
  Filled 2011-01-30: qty 30

## 2011-01-30 MED ORDER — MIDAZOLAM HCL 5 MG/5ML IJ SOLN
INTRAMUSCULAR | Status: DC | PRN
  Administered 2011-01-30: 2 mg via INTRAVENOUS

## 2011-01-30 MED ORDER — PROPOFOL 10 MG/ML IV EMUL
INTRAVENOUS | Status: DC | PRN
  Administered 2011-01-30: 200 mg via INTRAVENOUS

## 2011-01-30 MED ORDER — ACETAMINOPHEN 325 MG PO TABS: 650.0000 mg | ORAL_TABLET | Freq: Four times a day (QID) | ORAL | Status: DC | PRN

## 2011-01-30 MED ORDER — HEMOSTATIC AGENTS (NO CHARGE) OPTIME
TOPICAL | Status: DC | PRN
  Administered 2011-01-30: 1 via TOPICAL

## 2011-01-30 MED ORDER — NEOSTIGMINE METHYLSULFATE 1 MG/ML IJ SOLN
INTRAMUSCULAR | Status: DC | PRN
  Administered 2011-01-30: 4 mg via INTRAVENOUS

## 2011-01-30 MED ORDER — ONDANSETRON HCL 4 MG/2ML IJ SOLN
INTRAMUSCULAR | Status: DC | PRN
  Administered 2011-01-30: 4 mg via INTRAVENOUS

## 2011-01-30 MED ORDER — ALTEPLASE 2 MG IJ SOLR: 2.0000 mg | Freq: Once | INTRAMUSCULAR | Status: AC | PRN

## 2011-01-30 MED ORDER — FENTANYL CITRATE 0.05 MG/ML IJ SOLN
INTRAMUSCULAR | Status: DC | PRN
  Administered 2011-01-30: 100 ug via INTRAVENOUS
  Administered 2011-01-30 (×4): 50 ug via INTRAVENOUS
  Administered 2011-01-30 (×2): 100 ug via INTRAVENOUS

## 2011-01-30 MED ORDER — NALOXONE HCL 0.4 MG/ML IJ SOLN: 0.4000 mg | INTRAMUSCULAR | Status: DC | PRN

## 2011-01-30 MED ORDER — LIDOCAINE HCL (PF) 1 % IJ SOLN: 5.0000 mL | INTRAMUSCULAR | Status: DC | PRN

## 2011-01-30 MED ORDER — SODIUM CHLORIDE 0.9 % IR SOLN
Status: DC | PRN
  Administered 2011-01-30: 1000 mL

## 2011-01-30 MED ORDER — HYDROXYZINE HCL 25 MG PO TABS
25.0000 mg | ORAL_TABLET | Freq: Three times a day (TID) | ORAL | Status: DC | PRN
  Filled 2011-01-30: qty 1

## 2011-01-30 MED ORDER — GLYCOPYRROLATE 0.2 MG/ML IJ SOLN
INTRAMUSCULAR | Status: DC | PRN
  Administered 2011-01-30: .8 mg via INTRAVENOUS

## 2011-01-30 MED ORDER — ROCURONIUM BROMIDE 100 MG/10ML IV SOLN
INTRAVENOUS | Status: DC | PRN
  Administered 2011-01-30: 35 mg via INTRAVENOUS
  Administered 2011-01-30: 15 mg via INTRAVENOUS

## 2011-01-30 MED ORDER — VECURONIUM BROMIDE 10 MG IV SOLR
INTRAVENOUS | Status: DC | PRN
  Administered 2011-01-30: 2 mg via INTRAVENOUS
  Administered 2011-01-30: 3 mg via INTRAVENOUS
  Administered 2011-01-30: 2 mg via INTRAVENOUS
  Administered 2011-01-30: 1 mg via INTRAVENOUS

## 2011-01-30 MED ORDER — SODIUM CHLORIDE 0.9 % IV SOLN
INTRAVENOUS | Status: DC | PRN
  Administered 2011-01-30 (×2): via INTRAVENOUS

## 2011-01-30 MED ORDER — ONDANSETRON HCL 4 MG/2ML IJ SOLN: 4.0000 mg | Freq: Once | INTRAMUSCULAR | Status: AC | PRN

## 2011-01-30 MED ORDER — LIDOCAINE-EPINEPHRINE 1 %-1:100000 IJ SOLN
INTRAMUSCULAR | Status: DC | PRN
  Administered 2011-01-30: 20 mL

## 2011-01-30 MED ORDER — ONDANSETRON HCL 4 MG/2ML IJ SOLN: 4.0000 mg | Freq: Four times a day (QID) | INTRAMUSCULAR | Status: DC | PRN

## 2011-01-30 MED ORDER — DIPHENHYDRAMINE HCL 50 MG/ML IJ SOLN: 12.5000 mg | Freq: Four times a day (QID) | INTRAMUSCULAR | Status: DC | PRN

## 2011-01-30 MED ORDER — HEPARIN SODIUM (PORCINE) 1000 UNIT/ML DIALYSIS: 1000.0000 [IU] | INTRAMUSCULAR | Status: DC | PRN

## 2011-01-30 MED ORDER — LACTATED RINGERS IV SOLN
INTRAVENOUS | Status: DC | PRN
  Administered 2011-01-30: 14:00:00 via INTRAVENOUS

## 2011-01-30 SURGICAL SUPPLY — 92 items
ADH SKN CLS APL DERMABOND .7 (GAUZE/BANDAGES/DRESSINGS) ×1
APL SKNCLS STERI-STRIP NONHPOA (GAUZE/BANDAGES/DRESSINGS)
BAG DECANTER FOR FLEXI CONT (MISCELLANEOUS) ×2 IMPLANT
BENZOIN TINCTURE PRP APPL 2/3 (GAUZE/BANDAGES/DRESSINGS) IMPLANT
BLADE SURG ROTATE 9660 (MISCELLANEOUS) IMPLANT
BRUSH SCRUB EZ PLAIN DRY (MISCELLANEOUS) ×2 IMPLANT
BUR ACORN 6.0 ACORN (BURR) ×1 IMPLANT
BUR ACRON 5.0MM COATED (BURR) ×2 IMPLANT
BUR MATCHSTICK NEURO 3.0 LAGG (BURR) ×2 IMPLANT
CANISTER SUCTION 2500CC (MISCELLANEOUS) ×2 IMPLANT
CAP LCK SPNE (Orthopedic Implant) ×6 IMPLANT
CAP LOCK SPINE RADIUS (Orthopedic Implant) IMPLANT
CAP LOCKING (Orthopedic Implant) ×12 IMPLANT
CLOTH BEACON ORANGE TIMEOUT ST (SAFETY) ×2 IMPLANT
CONT SPEC 4OZ CLIKSEAL STRL BL (MISCELLANEOUS) ×3 IMPLANT
COVER BACK TABLE 24X17X13 BIG (DRAPES) IMPLANT
COVER TABLE BACK 60X90 (DRAPES) ×2 IMPLANT
DERMABOND ADVANCED (GAUZE/BANDAGES/DRESSINGS) ×1
DERMABOND ADVANCED .7 DNX12 (GAUZE/BANDAGES/DRESSINGS) ×2 IMPLANT
DRAPE C-ARM 42X72 X-RAY (DRAPES) ×4 IMPLANT
DRAPE LAPAROTOMY 100X72X124 (DRAPES) ×2 IMPLANT
DRAPE POUCH INSTRU U-SHP 10X18 (DRAPES) ×2 IMPLANT
DRAPE PROXIMA HALF (DRAPES) IMPLANT
DRAPE SURG 17X23 STRL (DRAPES) ×2 IMPLANT
DRESSING TELFA 8X3 (GAUZE/BANDAGES/DRESSINGS) IMPLANT
DRSG EMULSION OIL 3X3 NADH (GAUZE/BANDAGES/DRESSINGS) IMPLANT
DURAPREP 26ML APPLICATOR (WOUND CARE) ×1 IMPLANT
ELECT REM PT RETURN 9FT ADLT (ELECTROSURGICAL) ×2
ELECTRODE REM PT RTRN 9FT ADLT (ELECTROSURGICAL) ×1 IMPLANT
GAUZE SPONGE 4X4 16PLY XRAY LF (GAUZE/BANDAGES/DRESSINGS) ×2 IMPLANT
GLOVE BIOGEL PI IND STRL 7.0 (GLOVE) IMPLANT
GLOVE BIOGEL PI IND STRL 7.5 (GLOVE) IMPLANT
GLOVE BIOGEL PI IND STRL 8 (GLOVE) ×2 IMPLANT
GLOVE BIOGEL PI INDICATOR 7.0 (GLOVE) ×1
GLOVE BIOGEL PI INDICATOR 7.5 (GLOVE) ×1
GLOVE BIOGEL PI INDICATOR 8 (GLOVE) ×2
GLOVE ECLIPSE 7.5 STRL STRAW (GLOVE) ×10 IMPLANT
GLOVE EXAM NITRILE LRG STRL (GLOVE) IMPLANT
GLOVE EXAM NITRILE MD LF STRL (GLOVE) ×1 IMPLANT
GLOVE EXAM NITRILE XL STR (GLOVE) IMPLANT
GLOVE EXAM NITRILE XS STR PU (GLOVE) IMPLANT
GLOVE OPTIFIT SS 6.5 STRL BRWN (GLOVE) ×2 IMPLANT
GOWN BRE IMP SLV AUR LG STRL (GOWN DISPOSABLE) ×3 IMPLANT
GOWN BRE IMP SLV AUR XL STRL (GOWN DISPOSABLE) ×5 IMPLANT
GOWN STRL REIN 2XL LVL4 (GOWN DISPOSABLE) IMPLANT
KIT BASIN OR (CUSTOM PROCEDURE TRAY) ×2 IMPLANT
KIT INFUSE SMALL (Orthopedic Implant) ×1 IMPLANT
KIT POSITION SURG JACKSON T1 (MISCELLANEOUS) ×1 IMPLANT
KIT ROOM TURNOVER OR (KITS) ×2 IMPLANT
MILL MEDIUM DISP (BLADE) ×2 IMPLANT
NDL 18GX1X1/2 (RX/OR ONLY) (NEEDLE) ×1 IMPLANT
NDL HYPO 25X1 1.5 SAFETY (NEEDLE) ×1 IMPLANT
NDL SPNL 18GX3.5 QUINCKE PK (NEEDLE) IMPLANT
NDL SPNL 22GX3.5 QUINCKE BK (NEEDLE) ×1 IMPLANT
NEEDLE 18GX1X1/2 (RX/OR ONLY) (NEEDLE) IMPLANT
NEEDLE BONE MARROW 8GAX6 (NEEDLE) ×1 IMPLANT
NEEDLE HYPO 25X1 1.5 SAFETY (NEEDLE) ×2 IMPLANT
NEEDLE SPNL 18GX3.5 QUINCKE PK (NEEDLE) ×2 IMPLANT
NEEDLE SPNL 22GX3.5 QUINCKE BK (NEEDLE) ×2 IMPLANT
NS IRRIG 1000ML POUR BTL (IV SOLUTION) ×3 IMPLANT
PACK LAMINECTOMY NEURO (CUSTOM PROCEDURE TRAY) ×2 IMPLANT
PAD ARMBOARD 7.5X6 YLW CONV (MISCELLANEOUS) ×6 IMPLANT
PATTIES SURGICAL .5 X.5 (GAUZE/BANDAGES/DRESSINGS) IMPLANT
PATTIES SURGICAL .5 X1 (DISPOSABLE) ×1 IMPLANT
PATTIES SURGICAL 1X1 (DISPOSABLE) ×1 IMPLANT
PEEK PLIF AVS 8X25X4 DEGREE (Peek) ×4 IMPLANT
ROD 5.5X60MM GREEN (Rod) ×2 IMPLANT
SCREW 5.75X40M (Screw) ×2 IMPLANT
SCREW 5.75X45MM (Screw) ×4 IMPLANT
SPONGE GAUZE 4X4 12PLY (GAUZE/BANDAGES/DRESSINGS) ×2 IMPLANT
SPONGE LAP 4X18 X RAY DECT (DISPOSABLE) IMPLANT
SPONGE NEURO XRAY DETECT 1X3 (DISPOSABLE) IMPLANT
SPONGE SURGIFOAM ABS GEL 100 (HEMOSTASIS) IMPLANT
STAPLER SKIN PROX WIDE 3.9 (STAPLE) IMPLANT
STRIP CLOSURE SKIN 1/2X4 (GAUZE/BANDAGES/DRESSINGS) ×1 IMPLANT
STRIP VITOSS 25X100X4MM (Neuro Prosthesis/Implant) ×1 IMPLANT
STRIP VITOSS 25X50X4MM (Neuro Prosthesis/Implant) ×1 IMPLANT
SUT PROLENE 6 0 BV (SUTURE) IMPLANT
SUT VIC AB 1 CT1 18XBRD ANBCTR (SUTURE) ×2 IMPLANT
SUT VIC AB 1 CT1 8-18 (SUTURE) ×4
SUT VIC AB 2-0 CP2 18 (SUTURE) ×4 IMPLANT
SYR 20CC LL (SYRINGE) ×2 IMPLANT
SYR 3ML LL SCALE MARK (SYRINGE) ×4 IMPLANT
SYR 5ML LL (SYRINGE) IMPLANT
SYR CONTROL 10ML LL (SYRINGE) ×1 IMPLANT
SYR INSULIN 1ML 31GX6 SAFETY (SYRINGE) IMPLANT
TAPE CLOTH SURG 4X10 WHT LF (GAUZE/BANDAGES/DRESSINGS) ×1 IMPLANT
TOWEL OR 17X24 6PK STRL BLUE (TOWEL DISPOSABLE) ×2 IMPLANT
TOWEL OR 17X26 10 PK STRL BLUE (TOWEL DISPOSABLE) ×2 IMPLANT
TRAP SPECIMEN MUCOUS 40CC (MISCELLANEOUS) ×1 IMPLANT
TRAY FOLEY CATH 14FRSI W/METER (CATHETERS) ×1 IMPLANT
WATER STERILE IRR 1000ML POUR (IV SOLUTION) ×2 IMPLANT

## 2011-01-30 NOTE — Op Note (Signed)
Preoperative diagnosis: Lumbar stenosis dynamic lumbar spondylolisthesis lumbar spondylosis lumbar radiculopathy with foot drop.  Postoperative diagnosis: Lumbar stenosis dynamic lumbar spondylolisthesis lumbar spondylosis lumbar radiculopathy with foot drop.  Procedure: L4-S1 lumbar laminectomy facetectomy and foraminotomy with decompression beyond that required for interbody arthrodesis with decompression of the exiting L4-L5 and S1 nerve roots bilaterally with microdissection microsurgical technique. Bilateral L4-5 and L5-S1 posterior lumbar interbody arthrodesis with AVS peek interbody implants Vitoss with bone marrow aspirate and infuse. Bilateral L4-S1 posterior lateral arthrodesis with radius posterior instrumentation Vitoss with bone marrow aspirate infuse and locally harvested morcellized autograft.  Surgeon: Sherwood Gambler  Asst.: Luiz Ochoa  Anesthesia Gen. endotracheal  Procedure: Patient was brought to the operating room placed under general endotracheal anesthesia patient was turned to a prone position in the lumbar region was prepped with Betadine soap and solution and draped in a sterile fashion. A localizing x-ray was taken and the L4-5 and L5-S1 levels were identified. The midline was infiltrated with local anesthetic with epinephrine and then a midline incision was made carried down to subcutaneous tissue. Bipolar cautery and electrocautery were used to maintain hemostasis. The lumbar fascia was incised bilaterally and the paraspinal muscles were dissected from the spinous processes and lamina in a subperiosteal fashion. Another x-ray was taken and the L4-5 and L5-S1 levels were confirmed. Dissection was then carried out laterally over the hypertrophic facet joints.  We then began the decompression performing a laminectomy using double-action rongeurs a high-speed drill and Kerrison punches facetectomy was performed using double-action rongeurs a high-speed drill and Kerrison punches. We  exposed the transverse processes of L4 and L5 and the ala of S1 bilaterally. As the laminectomy was performed we found severe stenosis at both the L4-5 and L5-S1 levels care was taken to leave the underlying thecal sac and exiting nerve roots undisturbed. The decompression was extended laterally into the neural foramen to decompress the exiting L4, L5 and S1 nerve roots.  Once the decompression was completed we proceeded with the interbody arthrodesis. The annulus was incised bilaterally at each level. A thorough discectomy was performed bilaterally at each level using micro-curettes and pituitary rongeurs posterior spondylitic overgrowth was removed using Kerrison punches and the osteophyte removal tool. We carefully removed the cartilaginous endplates of each of the vertebra preparing them for interbody arthrodesis.  The C-arm fluoroscope was draped and brought into the field and pedicle entry sites were identified bilaterally for the pedicles at L4, L5 and S1. Each of the pedicles was probed with a pedicle probe. Bone marrow was aspirated from the vertebral bodies injected over a 10 cc strip and a 5 cc strip of Vitoss. Each of the pedicles was tapped with a 5.25 mm tap again examined with a ball probe good bony surfaces were found good threading was found and then we placed pedicle screws bilaterally at each level using 5.75 x 45 mm screws bilaterally at L4 and L5 and 5.75 x 40 mm screws bilaterally at S1.  We then packed 8 x 25 mm x 4 peek implants with a combination of Vitoss with bone marrow aspirate and infuse. We then placed the interbody implants. Each was placed retracting the thecal sac and nerve root medially and at the L5-S1 level we were able to pack the midline between the implants with additional Vitoss with bone marrow aspirate and infuse.  C-arm fluoroscopy as well as direct visualization demonstrated good positioning of the interbody implants and they were each countersunk as  needed.  We then packed the lateral  gutters over the transverse processes of L4-L5 and the ala and intertransverse space with a combination of infuse Vitoss with bone marrow aspirate and locally harvested morcellized autograft the materials were gently packed down against the bony surfaces and then we selected pre-lordosed 60 mm rods they were placed bilaterally within the screw heads and secured with locking caps. Once all 6 locking caps were placed final tightening was performed against a counter torque.  The wound had been irrigated numerous times the procedure with saline solution and at the end we irrigated the wound extensively with bacitracin solution we checked the laminectomy defect to ensure that none of the bone graft material had fallen down into the spinal canal and was found at the thecal sac and nerve roots remained well decompressed.  We then proceeded with closure paraspinal muscles were approximate interrupted undyed 1 Vicryl sutures deep fascia was closed with interrupted undyed 1 Vicryl sutures. Subcutaneous and second layer closed with interrupted inverted 2-0 undyed Vicryl sutures. Skin edges were approximated with Dermabond. The wound was dressed with sterile gauze and Hypafix.  Following the procedure the patient was turned to a supine position. To be reversed and the anesthetic extubated and transferred to the recovery room for further care.  Estimated blood loss: 750 cc  Cell Saver given: 310 cc  Count: Correct per RN staff

## 2011-01-30 NOTE — Consult Note (Signed)
[facility] Renal Consultation Note  Indication for Consultation:  Management of ESRD/hemodialysis; anemia, hypertension/volume and secondary hyperparathyroidism  HPI:  [name] is a 60 y.o. female with ESRD secondary to HTN on HD  For 21 yearswho was admitted by Dr. Sherwood Gambler following lumbar decompression and stabilization for her severely advanced and symptomatic degenerative lumbar disease. She has just returned from surgery and is in the Neuro PACU and is complaining of severe right leg pain.  No sob or CP.  During the surgery she had a 750 ml blood loss with 310 cc returned. Her post-op labs have not yet been drawn  Dialysis Orders: Center: [facility]  on TTS. EDW 53 HD Bath 2 K 2 Ca++ Time 3.5 Heparin 3000. Access right upper AVGG BFR 400 DFR A 1.5    Zemplar noneEpogen held 10/20 for Hgb of 12.3   Units IV/HD  Venofer  none  Other 37 degrees, profile 2  Past Medical History  Diagnosis Date  . Seizures     r/t HTN in 1990's x 1  . Brain aneurysm   . Blood transfusion 1990's    r/t Kidney removal surgery  . Arthritis     Back  . Anemia   . Umbilical hernia age 67  . Heart murmur     Born with heart mumur, does not require follow up per pt  . Hypertension     Does not see a heart doctor, had pre transplant stress test at [facility]    . Diabetes mellitus     Borderline  . End stage renal disease on dialysis     T/Th/Sat dialysis on [employer]  . Hepatitis C +        Past Surgical History  Procedure Date  . Nephrectomy 2010    right side done at [facility] on transplant list  . Umbilical hernia repair age 50- 55  . Appendectomy 1960's  . Vascular surgery     right arm dialysis graft  . Tonsillectomy     as a child  . Rib resection     d/t kidney removal   Family History  Problem Relation Age of Onset  . Anesthesia problems Neg Hx    Social History:  reports that she quit smoking about 5 months ago. Her smoking use included Cigarettes. She has a  24 pack-year smoking history. She has never used smokeless tobacco. She reports that she drinks about 1.2 ounces of alcohol per week. She reports that she does not use illicit drugs.  Allergies:  Allergies  Allergen Reactions  . Contrast Media (Iodinated Diagnostic Agents)     Seziure  . Iohexol Other (See Comments)    Reaction is convulsions    Prior to Admission medications   Medication Sig Start Date End Date Taking? Authorizing Provider  calcium carbonate (TUMS - DOSED IN MG ELEMENTAL CALCIUM) 500 MG chewable tablet Chew 1-2 tablets by mouth 3 (three) times daily.     Yes Historical Provider, MD; need to verify; takes calci-chews per outpt records.  metoprolol tartrate (LOPRESSOR) 25 MG tablet Take 25 mg by mouth 2 (two) times daily.        Yes Not on her med list from [facility]; listed on outpt list as intolerance  naproxen sodium (ANAPROX) 220 MG tablet Take 220 mg by mouth 2 (two) times daily as needed. For pain; take with meals    Yes Historical Provider, MD    Labs:  Results for orders placed during the hospital  encounter of 01/30/11 (from the past 48 hour(s))  POCT I-STAT 4, (NA,K, GLUC, HGB,HCT)     Status: Normal   Collection Time   01/30/11 12:16 PM      Component Value Range Comment   Sodium 138  135 - 145 (mEq/L)    Potassium 5.0  3.5 - 5.1 (mEq/L)    Glucose, Bld 77  70 - 99 (mg/dL)    HCT 40.0  36.0 - 46.0 (%)    Hemoglobin 13.6  12.0 - 15.0 (g/dL)   ROS: Pain in back and right leg.  No CP, SOB, nausea.  Dry mouth.  Filed Vitals:   01/30/11 1150  BP: 101/71  Pulse: 82  Temp: 97.9 F (36.6 C)  Resp: 20     General: Uncomfortable thin black female.  Moderate distress due to pain.  Tachy; BP 90's (from 110-120) HEENT: [place]/AT; not icteric;  Neck: Supple. No JVD Heart:Tachy at 95; S1S2 no S3  2/6 murmur plus transmitted bruit from AVF Lungs:Clear Abdomen:Soft. Not tender Extremities: Pillow between legs and propped on pillows.  No edema.  Large aneurysmal right  AVF with excellent bruit Skin:Warm, dry Neuro:Agitated secondary to pain but able to talk to me.   Dialysis Access: Right AVF TELE: peaked T-waves; sinus tachy Assessment/Plan: 1. S/p lumbar decompression surgery - per Dr. Sherwood Gambler 2. ESRD -  TTS per routine.  istat K elevated at 5 earlier.  Recheck post surgery today; no heparin dialysis; peaked T-waves worrisome for hyperkalemia - if high will dialyze tonight rather than in the AM.  Lab is on the way to draw. 3  Hypertension/volume  - requesting med list from her pharmacy ([facility]) to verify meds  4. Anemia  - ESA on hold. Recheck CBC now with blood loss as above (750 loss; 310 returned through cell saver) 5. Metabolic bone disease -  S/p parathyroidectomy 2002 6. Nutrition - renal diet  Myriam Jacobson, PA-C [facility] Beeper [phone] 01/30/2011, 3:30 PM   Consulting Nephrologist: Dr. Jamal Maes   I have seen and examined this patient and the plan is as outlined.  Waiting on results from the renal panel re: potassium to determine if OK to wait until tomorrow for dialysis as originally planned vs dialysis tonight. Hrithik Boschee B,MD 01/30/2011 8:10 PM

## 2011-01-30 NOTE — Anesthesia Postprocedure Evaluation (Signed)
  Anesthesia Post-op Note  Patient: [name]  Procedure(s) Performed:  POSTERIOR LUMBAR FUSION 2 LEVEL - Lumbar Four-Five Decompression with Posterior Lumbar Four-Five/Lumbar Five-Sacral One Interbody and Fusion, Posterior Lateral Arthrodesis, and Posterior Segmental Instrumentation   Patient Location: PACU  Anesthesia Type: General  Level of Consciousness: awake  Airway and Oxygen Therapy: Patient connected to nasal cannula oxygen  Post-op Pain: mild  Post-op Assessment: Patient's Cardiovascular Status Stable and Respiratory Function Stable  Post-op Vital Signs: stable  Complications: No apparent anesthesia complications

## 2011-01-30 NOTE — Transfer of Care (Signed)
Immediate Anesthesia Transfer of Care Note  Patient: [name]  Procedure(s) Performed:  POSTERIOR LUMBAR FUSION 2 LEVEL - Lumbar Four-Five Decompression with Posterior Lumbar Four-Five/Lumbar Five-Sacral One Interbody and Fusion, Posterior Lateral Arthrodesis, and Posterior Segmental Instrumentation   Patient Location: PACU  Anesthesia Type: General  Level of Consciousness: awake, alert  and oriented  Airway & Oxygen Therapy: Patient Spontanous Breathing and Patient connected to nasal cannula oxygen  Post-op Assessment: Report given to PACU RN, Post -op Vital signs reviewed and stable and Patient moving all extremities X 4  Post vital signs: Reviewed and stable  Complications: No apparent anesthesia complications

## 2011-01-30 NOTE — Preoperative (Signed)
Beta Blockers   Reason not to administer Beta Blockers:took B Blocker this am @ 0700

## 2011-01-30 NOTE — Progress Notes (Signed)
Filed Vitals:   01/30/11 1930 01/30/11 2000 01/30/11 2030 01/30/11 2100  BP: 120/79 85/57 89/63  80/56  Pulse:      Temp:      Resp:      Weight:      SpO2: 100%       CBC  Basename 01/30/11 2031 01/30/11 1216  WBC 12.2* --  HGB 12.5 13.6  HCT 37.0 40.0  PLT 186 --   BMET  Basename 01/30/11 2031 01/30/11 1216  NA 138 138  K 5.9* 5.0  CL 96 --  CO2 24 --  GLUCOSE 111* 77  BUN 42* --  CREATININE 7.74* --  CALCIUM 7.2* --    Patient is seen in the PACU. Patient reports that she is comfortable. She has been given some Dilaudid IV but the PCA morphine has not yet been set up. Dressing is clean and dry per PACU RN staff. Patient has been seen in nephrology consultation by Dr. Jamal Maes who is inclined to proceed with dialysis tonight due to her hyperkalemia.  On exam patient is moving all 4 extremities well to command.   Plan: Decision regarding dialysis per Dr. Lorrene Reid. Discussed with Dr. Lorrene Reid alternatives of 3000 versus 6700, we'll have patient go to 3000 to optimize post lumbar fusion care.

## 2011-01-30 NOTE — H&P (Signed)
Subjective: Patient is a 60 y.o. female who is admitted for treatment of severe and advanced degenerative changes in the lower lumbar spine. The patient presented with low back pain and right lumbar radicular pain. She was evaluated with x-rays and MRI scan.  Prior to my evaluation she been treated with epidural steroid injections by another physician.  When I first evaluated the patient in August of this year she is found to have a significant right foot drop. X-rays showed a grade 2 dynamic degenerative spondylolisthesis of L4 on L5. MRI scan revealed severe multifactorial spinal stenosis at L4-5 and marked to severe multifactorial spinal stenosis at L5-S1.  The patient had been scheduled for surgery in September where she developed an infected tooth that required extraction and antibiotic therapy and wants infection was cleared up she is now being admitted for surgery.  10 patient is to be admitted for an L4-S1 decompressive lumbar laminectomy facetectomy and foraminotomy a bilateral L4-5 and L5-S1 posterior lumbar interbody arthrodesis with peek interbody implants and bone graft at L4-S1 posterior lateral arthrodesis with posterior instrumentation and bone graft.  I discussed with her the nature of her condition the nature the surgical procedure the typical length of surgery hospital stay and overall recuperation her limitations postoperatively the need for postoperative mobilization in a lumbar brace and risks of surgery including infection bleeding possibly transfusion risk of nerve dysfunction with pain weakness numbness or paresthesias the risk of dural tear and CSF leakage and possibly for further surgery and the risk of failure of the fusion construct due to her smoking and presumed poor bone metabolism related to her end-stage renal disease and hemodialysis and anesthetic risks of myocardial infarction stroke pneumonia and death.    There are no active problems to display for this  patient.  Past Medical History  Diagnosis Date  . Seizures     r/t HTN in 1990's x 1  . Brain aneurysm   . Blood transfusion 1990's    r/t Kidney removal surgery  . Arthritis     Back  . Anemia   . Umbilical hernia age 35  . Heart murmur     Born with heart mumur, does not require follow up per pt  . Hypertension     Does not see a heart doctor, had pre transplant stress test at [facility]    . Diabetes mellitus     Borderline  . End stage renal disease on dialysis     T/Th/Sat dialysis on [employer]  . End stage renal disease on dialysis     01-29-2011    Past Surgical History  Procedure Date  . Nephrectomy 2010    right side done at [facility] on transplant list  . Umbilical hernia repair age 33- 41  . Appendectomy 1960's  . Vascular surgery     right arm dialysis graft  . Tonsillectomy     as a child  . Rib resection     d/t kidney removal    Prescriptions prior to admission  Medication Sig Dispense Refill  . calcium carbonate (TUMS - DOSED IN MG ELEMENTAL CALCIUM) 500 MG chewable tablet Chew 1-2 tablets by mouth 3 (three) times daily.        . metoprolol tartrate (LOPRESSOR) 25 MG tablet Take 25 mg by mouth 2 (two) times daily.        . naproxen sodium (ANAPROX) 220 MG tablet Take 220 mg by mouth 2 (two) times daily as needed. For pain; take with meals  Allergies  Allergen Reactions  . Contrast Media (Iodinated Diagnostic Agents)     Seziure  . Iohexol Other (See Comments)    Reaction is convulsions    History  Substance Use Topics  . Smoking status: Former Smoker -- 0.5 packs/day for 48 years    Types: Cigarettes    Quit date: 08/17/2010  . Smokeless tobacco: Never Used  . Alcohol Use: 1.2 oz/week    2 Shots of liquor per week    Family History  Problem Relation Age of Onset  . Anesthesia problems Neg Hx      Review of Systems A comprehensive review of systems was negative.  Objective: Vital signs in last 24 hours: Temp:  [97.9 F (36.6  C)] 97.9 F (36.6 C) (11/14 1150) Pulse Rate:  [82] 82  (11/14 1150) Resp:  [20] 20  (11/14 1150) BP: (101)/(71) 101/71 mmHg (11/14 1150) SpO2:  [95 %] 95 % (11/14 1150) Weight:  [54.432 kg (120 lb)] 120 lb (54.432 kg) (11/13 1500)  EXAM  Patient is a thin black female in no acute distress. Lungs are clear to auscultation she has symmetrical respiratory excursion. Heart has a regular rate and rhythm normal S1 and S2 there is no murmur. Extremity examination shows no clubbing cyanosis or edema. Musculoskeletal examination shows no tenderness to palpation over the lumbar spinous processes or paralumbar musculature. She is able to flex to 90 but cannot extend at all in his pain level radiate down to the right lower extremity with extension straight leg raising is negative bilaterally  Motor examination shows 5 over 5 strength in the iliopsoas and quadriceps bilaterally however the left dorsiflexor excess housewife and plantar flexor R. fire 5 the right dorsiflexor is 3-4 minus over 5 extensor hallicus longus is [id number] right plantar flexor is 5 over 5. Sensation is intact to pinprick in the distal lower extremities, reflexes left quadriceps is 1 right quadriceps is minimal gastrocnemius reflex is absent bilaterally. Toes are downgoing bilaterally. She has a normal gait and stance. Data Review CBC:  Lab Results  Component Value Date   WBC 5.8 01/25/2011   RBC 4.55 01/25/2011   BMP:  Lab Results  Component Value Date   GLUCOSE 85 01/25/2011   CO2 34* 01/25/2011   BUN 34* 01/25/2011   CREATININE 7.06* 01/25/2011   CALCIUM 8.5 01/25/2011    Assessment/Plan: Patient with advanced degenerative changes in the lower lumbar spine with severe multilevel multifactorial lumbar stenosis. Patient with disabling low back and right lumbar radicular pain with a significant right foot drop.  Plan is to proceed as described above with lumbar decompression and stabilization.   Hosie Spangle, MD 01/30/2011  1:29 PM

## 2011-01-30 NOTE — Anesthesia Procedure Notes (Signed)
Procedure Name: Intubation Date/Time: 01/30/2011 2:23 PM Performed by: Sabra Heck Pre-anesthesia Checklist: Patient identified, Timeout performed, Emergency Drugs available, Suction available and Patient being monitored Patient Re-evaluated:Patient Re-evaluated prior to inductionOxygen Delivery Method: Circle System Utilized Preoxygenation: Pre-oxygenation with 100% oxygen Intubation Type: IV induction Ventilation: Mask ventilation without difficulty Laryngoscope Size: Miller and 2 Grade View: Grade I Tube type: Oral Tube size: 7.5 mm Number of attempts: 1 Airway Equipment and Method: stylet Placement Confirmation: ETT inserted through vocal cords under direct vision,  breath sounds checked- equal and bilateral and positive ETCO2 Secured at: 22 cm Tube secured with: Tape Dental Injury: Teeth and Oropharynx as per pre-operative assessment

## 2011-01-30 NOTE — Progress Notes (Signed)
Potassium has returned at 5.9 and patient unable to take Kayexelate.  Will do dialysis tonight - same orders as written for toorrow.  Myeshia Fojtik B

## 2011-01-31 ENCOUNTER — Encounter (HOSPITAL_COMMUNITY): Payer: Self-pay | Admitting: *Deleted

## 2011-01-31 ENCOUNTER — Inpatient Hospital Stay (HOSPITAL_COMMUNITY): Payer: Medicare Other

## 2011-01-31 LAB — BASIC METABOLIC PANEL
BUN: 15 mg/dL (ref 6–23)
CO2: 31 mEq/L (ref 19–32)
Calcium: 8 mg/dL — ABNORMAL LOW (ref 8.4–10.5)
Chloride: 97 mEq/L (ref 96–112)
Creatinine, Ser: 3.93 mg/dL — ABNORMAL HIGH (ref 0.50–1.10)
GFR calc Af Amer: 13 mL/min — ABNORMAL LOW (ref >90)
GFR calc non Af Amer: 11 mL/min — ABNORMAL LOW (ref >90)
Glucose, Bld: 104 mg/dL — ABNORMAL HIGH (ref 70–99)
Potassium: 4.1 mEq/L (ref 3.5–5.1)
Sodium: 140 mEq/L (ref 135–145)

## 2011-01-31 LAB — CBC
HCT: 30.4 % — ABNORMAL LOW (ref 36.0–46.0)
Hemoglobin: 10.6 g/dL — ABNORMAL LOW (ref 12.0–15.0)
MCH: 30.3 pg (ref 26.0–34.0)
MCHC: 34.9 g/dL (ref 30.0–36.0)
MCV: 86.9 fL (ref 78.0–100.0)
Platelets: 134 10*3/uL — ABNORMAL LOW (ref 150–400)
RBC: 3.5 MIL/uL — ABNORMAL LOW (ref 3.87–5.11)
RDW: 14.9 % (ref 11.5–15.5)
WBC: 9.4 10*3/uL (ref 4.0–10.5)

## 2011-01-31 LAB — RENAL FUNCTION PANEL
Chloride: 98 mEq/L (ref 96–112)
GFR calc Af Amer: 13 mL/min — ABNORMAL LOW (ref >90)
Glucose, Bld: 104 mg/dL — ABNORMAL HIGH (ref 70–99)
Phosphorus: 2.9 mg/dL (ref 2.3–4.6)
Potassium: 4.1 mEq/L (ref 3.5–5.1)
Sodium: 141 mEq/L (ref 135–145)

## 2011-01-31 MED ORDER — WHITE PETROLATUM GEL
Status: AC
  Administered 2011-01-31: 11:00:00
  Filled 2011-01-31: qty 5

## 2011-01-31 MED ORDER — PHENOL 1.4 % MT LIQD: 1.0000 | OROMUCOSAL | Status: DC | PRN

## 2011-01-31 MED ORDER — CALCIUM CARBONATE ANTACID 500 MG PO CHEW
1.0000 | CHEWABLE_TABLET | Freq: Three times a day (TID) | ORAL | Status: DC
  Administered 2011-01-31 – 2011-02-01 (×3): 400 mg via ORAL
  Filled 2011-01-31 (×6): qty 2

## 2011-01-31 MED ORDER — CYCLOBENZAPRINE HCL 10 MG PO TABS
10.0000 mg | ORAL_TABLET | Freq: Three times a day (TID) | ORAL | Status: DC | PRN
Start: 2011-01-31 — End: 2011-02-01
  Administered 2011-01-31: 10 mg via ORAL
  Filled 2011-01-31: qty 1

## 2011-01-31 MED ORDER — MENTHOL 3 MG MT LOZG: 1.0000 | LOZENGE | OROMUCOSAL | Status: DC | PRN

## 2011-01-31 MED ORDER — ACETAMINOPHEN 325 MG PO TABS: 650.0000 mg | ORAL_TABLET | ORAL | Status: DC | PRN

## 2011-01-31 MED ORDER — SODIUM CHLORIDE 0.45 % IV SOLN
INTRAVENOUS | Status: DC
  Administered 2011-01-31: 02:00:00 via INTRAVENOUS

## 2011-01-31 MED ORDER — HYDROXYZINE HCL 50 MG/ML IM SOLN
50.0000 mg | INTRAMUSCULAR | Status: DC | PRN
  Filled 2011-01-31: qty 1

## 2011-01-31 MED ORDER — SODIUM CHLORIDE 0.9 % IJ SOLN: 3.0000 mL | INTRAMUSCULAR | Status: DC | PRN

## 2011-01-31 MED ORDER — ACETAMINOPHEN 650 MG RE SUPP: 650.0000 mg | RECTAL | Status: DC | PRN

## 2011-01-31 MED ORDER — HYDROXYZINE HCL 50 MG PO TABS
50.0000 mg | ORAL_TABLET | ORAL | Status: DC | PRN
  Filled 2011-01-31: qty 1

## 2011-01-31 MED ORDER — MORPHINE SULFATE 4 MG/ML IJ SOLN
4.0000 mg | INTRAMUSCULAR | Status: DC | PRN
  Administered 2011-02-01 (×2): 4 mg via INTRAMUSCULAR
  Filled 2011-01-31 (×2): qty 1

## 2011-01-31 MED ORDER — LORAZEPAM 2 MG/ML IJ SOLN
INTRAMUSCULAR | Status: AC
  Filled 2011-01-31: qty 1

## 2011-01-31 MED ORDER — SODIUM CHLORIDE 0.9 % IV SOLN: 250.0000 mL | INTRAVENOUS | Status: DC

## 2011-01-31 MED ORDER — METOPROLOL TARTRATE 25 MG PO TABS
25.0000 mg | ORAL_TABLET | Freq: Two times a day (BID) | ORAL | Status: DC
Start: 2011-01-31 — End: 2011-02-01
  Filled 2011-01-31 (×5): qty 1

## 2011-01-31 MED ORDER — HYDROCODONE-ACETAMINOPHEN 5-325 MG PO TABS
1.0000 | ORAL_TABLET | ORAL | Status: DC | PRN
  Administered 2011-01-31: 2 via ORAL
  Filled 2011-01-31: qty 2

## 2011-01-31 MED ORDER — LORAZEPAM 2 MG/ML IJ SOLN
0.5000 mg | Freq: Once | INTRAMUSCULAR | Status: AC
  Administered 2011-01-31: 0.5 mg via INTRAVENOUS

## 2011-01-31 MED ORDER — ALUM & MAG HYDROXIDE-SIMETH 400-400-40 MG/5ML PO SUSP
30.0000 mL | Freq: Four times a day (QID) | ORAL | Status: DC | PRN
  Filled 2011-01-31: qty 30

## 2011-01-31 MED ORDER — KETOROLAC TROMETHAMINE 30 MG/ML IJ SOLN
30.0000 mg | Freq: Four times a day (QID) | INTRAMUSCULAR | Status: DC
  Administered 2011-01-31 – 2011-02-01 (×6): 30 mg via INTRAVENOUS
  Filled 2011-01-31 (×10): qty 1

## 2011-01-31 MED ORDER — OXYCODONE-ACETAMINOPHEN 5-325 MG PO TABS
1.0000 | ORAL_TABLET | ORAL | Status: DC | PRN
  Administered 2011-01-31 (×2): 2 via ORAL
  Administered 2011-02-01 (×2): 1 via ORAL
  Filled 2011-01-31: qty 2
  Filled 2011-01-31 (×2): qty 1
  Filled 2011-01-31: qty 2

## 2011-01-31 MED ORDER — KETOROLAC TROMETHAMINE 30 MG/ML IJ SOLN: 30.0000 mg | Freq: Once | INTRAMUSCULAR | Status: DC

## 2011-01-31 MED ORDER — ZOLPIDEM TARTRATE 5 MG PO TABS: 5.0000 mg | ORAL_TABLET | Freq: Every evening | ORAL | Status: DC | PRN

## 2011-01-31 MED ORDER — DOCUSATE SODIUM 100 MG PO CAPS
100.0000 mg | ORAL_CAPSULE | Freq: Two times a day (BID) | ORAL | Status: DC
  Administered 2011-01-31 – 2011-02-01 (×3): 100 mg via ORAL
  Filled 2011-01-31 (×3): qty 1

## 2011-01-31 MED ORDER — SODIUM CHLORIDE 0.9 % IJ SOLN: 3.0000 mL | Freq: Two times a day (BID) | INTRAMUSCULAR | Status: DC

## 2011-01-31 NOTE — Progress Notes (Signed)
Filed Vitals:   01/31/11 0215 01/31/11 0238 01/31/11 0600 01/31/11 0950  BP: 98/64  98/62 95/60  Pulse: 104  97 94  Temp: 99 F (37.2 C)  98.5 F (36.9 C) 98.6 F (37 C)  TempSrc:    Oral  Resp: 18  16 16   Height:  5\' 4"  (1.626 m)    Weight:  60.2 kg (132 lb 11.5 oz)    SpO2: 99%  97% 99%    CBC  Basename 01/31/11 0256 01/30/11 2031  WBC 9.4 12.2*  HGB 10.6* 12.5  HCT 30.4* 37.0  PLT 134* 186   BMET  Basename 01/31/11 0256 01/31/11 0255  NA 140 141  K 4.1 4.1  CL 97 98  CO2 31 30  GLUCOSE 104* 104*  BUN 15 15  CREATININE 3.93* 3.94*  CALCIUM 8.0* 7.9*    Patient is in her room on the 3000 units undergoing physical therapy. Her dressing is clean and dry. She has some numbness in the left foot and some mild weakness in both feet. She is to continue both physical therapy and occupational therapy we have asked the nursing staff to change her IV to a saline lock. We will arrange for her to have a rolling walker with 5 inch wheels and a 3 and 1 for use at home. We will have her dressing DC'd in the a.m. we appreciate the assistance of [facility] in the care of this patient.   Plan: Progressively mobilize continue PT and OT arranged for home DME.

## 2011-01-31 NOTE — Progress Notes (Signed)
Pt. Complains of worsening numbness in both legs. RN instructed pt to keep pillows between legs and keep them elevated. Pillows applied. Pt. BP checked, SBP 90. MD made aware. Currently pt under no s/s of distress. Will continue to monitor.

## 2011-01-31 NOTE — Progress Notes (Signed)
Physical Therapy Evaluation Patient Details Name: [name] MRN: [id number] DOB: 07-Jun-1950 Today's Date: 01/31/2011  Problem List:  Patient Active Problem List  Diagnoses  . Lumbar stenosis with neurogenic claudication  . Spondylolisthesis of lumbar region  . End stage renal disease on dialysis  . Hypertension  . Unspecified viral hepatitis C without hepatic coma    Past Medical History:  Past Medical History  Diagnosis Date  . Seizures     r/t HTN in 1990's x 1  . Brain aneurysm   . Blood transfusion 1990's    r/t Kidney removal surgery  . Arthritis     Back  . Anemia   . Umbilical hernia age 50  . Heart murmur     Born with heart mumur, does not require follow up per pt  . Hypertension     Does not see a heart doctor, had pre transplant stress test at [facility]    . Diabetes mellitus     Borderline  . End stage renal disease on dialysis     T/Th/Sat dialysis on [employer]  . End stage renal disease on dialysis     01-29-2011  . Hepatitis C    Past Surgical History:  Past Surgical History  Procedure Date  . Nephrectomy 2010    right side done at [facility] on transplant list  . Umbilical hernia repair age 14- 39  . Appendectomy 1960's  . Vascular surgery     right arm dialysis graft  . Tonsillectomy     as a child  . Rib resection     d/t kidney removal    PT Assessment/Plan/Recommendation PT Assessment Clinical Impression Statement: Pt is a 60 y/o female admitted for s/p PLIF L4-S1 along with the below problem list.  Pt would benefit from continued PT acutely to maximize independence and decrease burden of care to facilitate d/c home with HHPT. PT Recommendation/Assessment: Patient will need skilled PT in the acute care venue PT Problem List: Decreased strength;Decreased activity tolerance;Decreased balance;Decreased mobility;Decreased knowledge of precautions;Pain;Impaired sensation Barriers to Discharge: None PT Therapy Diagnosis : Acute  pain;Difficulty walking PT Plan PT Frequency: Min 5X/week PT Recommendation Follow Up Recommendations: Home health PT;24 hour supervision/assistance Equipment Recommended: Rolling walker with 5" wheels PT Goals  Acute Rehab PT Goals PT Goal Formulation: With patient/family Time For Goal Achievement: 7 days Pt will Roll Supine to Left Side: with modified independence PT Goal: Rolling Supine to Left Side - Progress: Other (comment) (Set today) Pt will go Sit to Supine/Side: with modified independence PT Goal: Sit to Supine/Side - Progress: Other (comment) (Set today) Pt will Transfer Sit to Stand/Stand to Sit: with modified independence PT Transfer Goal: Sit to Stand/Stand to Sit - Progress: Other (comment) (Set today) Pt will Ambulate: 51 - 150 feet;with supervision;with least restrictive assistive device PT Goal: Ambulate - Progress: Other (comment) (Set today) Pt will Go Up / Down Stairs: 1-2 stairs;with min assist;with least restrictive assistive device PT Goal: Up/Down Stairs - Progress: Other (comment) (Set today)  PT Evaluation Precautions/Restrictions  Precautions Precautions: Back Precaution Booklet Issued: Yes (comment) (Back handout given.) Required Braces or Orthoses: Yes Spinal Brace: Lumbar corset;Applied in standing position;Other (comment) (Don in standing per Dr. Sherwood Gambler.) Restrictions Weight Bearing Restrictions: No Prior Functioning  Home Living Lives With: Alone Receives Help From: Other (Comment) (Daughter and friends to come and stay.) Type of Home: House Home Layout: One level Home Access: Stairs to enter Entrance Stairs-Rails: Right Entrance Stairs-Number of Steps: 2 Home  Adaptive Equipment: None Prior Function Level of Independence: Independent with basic ADLs;Independent with homemaking with ambulation;Independent with gait;Independent with transfers Able to Take Stairs?: Yes Driving: Yes Cognition Cognition Arousal/Alertness:  Awake/alert Overall Cognitive Status: Appears within functional limits for tasks assessed Orientation Level: Oriented X4 Sensation/Coordination Sensation Light Touch: Impaired Detail (Bilateral feet numb.) Light Touch Impaired Details: Absent RLE;Absent LLE (In feet only.) Stereognosis: Not tested Hot/Cold: Not tested Proprioception: Not tested Coordination Gross Motor Movements are Fluid and Coordinated: Yes Fine Motor Movements are Fluid and Coordinated: Not tested Extremity Assessment RUE Assessment RUE Assessment: Not tested LUE Assessment LUE Assessment: Not tested RLE Assessment RLE Assessment: Exceptions to [facility] RLE Strength RLE Overall Strength: Deficits RLE Overall Strength Comments: 4/5 Right Ankle Dorsiflexion: 2-/5 LLE Assessment LLE Assessment: Exceptions to [facility] LLE Strength LLE Overall Strength: Deficits LLE Overall Strength Comments: 4/5 Left Ankle Dorsiflexion: 2-/5  Pain 6/10 pain throughout in back radiating toward bilateral LEs.  Pt premedicated and repositioned.  Mobility (including Balance) Bed Mobility Bed Mobility: Yes Rolling Left: 4: Min assist Rolling Left Details (indicate cue type and reason): Assist to facilitate rotation of trunk with verbal cues for sequence. Left Sidelying to Sit: 4: Min assist;HOB flat;With rails Left Sidelying to Sit Details (indicate cue type and reason): Assist to translate trunk to midline with cues for sequence. Transfers Transfers: Yes Sit to Stand: 4: Min assist;From bed Sit to Stand Details (indicate cue type and reason): Assist to balance with cues for hand placement and safety. Stand to Sit: 4: Min assist;To chair/3-in-1 Stand to Sit Details: Assist to eccentric control of trunk with cues for safest hand placement. Ambulation/Gait Ambulation/Gait: Yes Ambulation/Gait Assistance: 3: Mod assist Ambulation/Gait Assistance Details (indicate cue type and reason): Assist for balance secondary to left lean with  decreased strength of bilateral LEs (with bilateral knees often buckling especially with fatigue).  Cues to stay inside RW and extend trunk.  Scissoring at feet.  Cues for heel strike throughout distance. Ambulation Distance (Feet): 60 Feet Assistive device: Rolling walker Gait Pattern: Decreased step length - right;Decreased step length - left;Decreased dorsiflexion - right;Decreased dorsiflexion - left;Right steppage;Left steppage;Scissoring;Trunk flexed Stairs: No Wheelchair Mobility Wheelchair Mobility: No  Posture/Postural Control Posture/Postural Control: No significant limitations Balance Balance Assessed: No Exercise    End of Session PT - End of Session Equipment Utilized During Treatment: Gait belt;Back brace Activity Tolerance: Patient tolerated treatment well Patient left: in chair;with call bell in reach;with family/visitor present Nurse Communication: Mobility status for transfers;Mobility status for ambulation General Behavior During Session: [facility] for tasks performed Cognition: [facility] for tasks performed  Cyndia Bent 01/31/2011, 11:23 AM  01/31/2011 Cyndia Bent, PT, DPT [phone]

## 2011-01-31 NOTE — Progress Notes (Signed)
Pt's family took back brace home for the night. Pt voiced they will bring it back this am. Will attempt to get pt up once brace arrives.

## 2011-01-31 NOTE — Progress Notes (Signed)
Subjective: Interval History: has complaints of back pain.  Objective: Vital signs in last 24 hours: Temp:  [96.9 F (36.1 C)-99 F (37.2 C)] 98.6 F (37 C) (11/15 0950) Pulse Rate:  [71-121] 94  (11/15 0950) Resp:  [9-25] 16  (11/15 0950) BP: (77-120)/(38-82) 95/60 mmHg (11/15 0950) SpO2:  [71 %-100 %] 99 % (11/15 0950) Weight:  [57 kg (125 lb 10.6 oz)-60.2 kg (132 lb 11.5 oz)] 132 lb 11.5 oz (60.2 kg) (11/15 0238) Weight change: 3.368 kg (7 lb 6.8 oz)  Intake/Output from previous day: 11/14 0701 - 11/15 0700 In: 5627.5 [P.O.:400; I.V.:4407.5; Blood:820] Out: 2300 [Blood:1500] Intake/Output this shift:    General: Uncomfortable thin black female. Moderate distress due to pain. Tachy; BP 90's (from 110-120)  HEENT: [place]/AT; not icteric;  Neck: Supple. No JVD  Heart:Tachy at 95; S1S2 no S3 2/6 murmur plus transmitted bruit from AVF  Lungs:Clear  Abdomen:Soft. Not tender  Extremities: Pillow between legs and propped on pillows. No edema. Large aneurysmal right AVF with excellent bruit  Skin:Warm, dry  Neuro:Agitated secondary to pain but able to talk to me.  Dialysis Access: Right AVF  TELE: peaked T-waves; sinus tachy  Lab Results:  [facility] 01/31/11 0256 01/30/11 2031  WBC 9.4 12.2*  HGB 10.6* 12.5  HCT 30.4* 37.0  PLT 134* 186   BMET:  Basename 01/31/11 0256 01/31/11 0255  NA 140 141  K 4.1 4.1  CL 97 98  CO2 31 30  GLUCOSE 104* 104*  BUN 15 15  CREATININE 3.93* 3.94*  CALCIUM 8.0* 7.9*   No results found for this basename: PTH:2 in the last 72 hours Iron Studies: No results found for this basename: IRON,TIBC,TRANSFERRIN,FERRITIN in the last 72 hours  Studies/Results: Dg Lumbar Spine 2-3 Views  01/30/2011  *RADIOLOGY REPORT*  Clinical Data: 60 year old female undergoing lumbar spine surgery.  LUMBAR SPINE - 2-3 VIEW  Comparison: Intraoperative radiographs from 1450 hours the same day and earlier.  Fluoroscopy time of 0.5 minutes was utilized.  Findings:  Intraoperative frontal and lateral fluoroscopic views of the lower lumbar spine demonstrate bilateral transpedicular hardware placement at L4, L5, and S1.  Interbody implant at L4-L5 and L5-S1.  Anterolisthesis of L4 on L5 re-identified.  IMPRESSION: Posterior and interbody fusion depicted at L4-L5 and L5-S1.  Original Report Authenticated By: Randall An, M.D.   Dg Lumbar Spine 2-3 Views  01/30/2011  *RADIOLOGY REPORT*  Clinical Data: For S1 PLIF  LUMBAR SPINE - 2-3 VIEW  Comparison: 11/07/2010  Findings: Initial film shows a needle at the level of the spinous process of L4.  Second film shows tissue spreaders posteriorly with probes at the L4 and L5 laminar levels.  IMPRESSION: L4 and L5 laminar levels localized.  Original Report Authenticated By: Jules Schick, M.D.   Dg C-arm 1-60 Min  01/30/2011  CLINICAL DATA: L4-5, L5-S1 PLIF   C-ARM 1-60 MINUTES  Fluoroscopy was utilized by the requesting physician.  No radiographic  interpretation.      Scheduled:   . calcium carbonate  1-2 tablet Oral TID  . ceFAZolin (ANCEF) IV  1 g Intravenous 60 min Pre-Op  . docusate sodium  100 mg Oral BID  . ketorolac  30 mg Intravenous Q6H  . LORazepam  0.5 mg Intravenous Once  . metoprolol tartrate  25 mg Oral BID  . DISCONTD: ketorolac  30 mg Intravenous Once  . DISCONTD: LORazepam      . DISCONTD: morphine   Intravenous Q4H  . DISCONTD: sodium  chloride  3 mL Intravenous Q12H   Continuous:   . sodium chloride 30 mL/hr at 01/31/11 0600  . DISCONTD: sodium chloride      Assessment/Plan:  1. S/p lumbar decompression surgery - per Dr. Sherwood Gambler  2. ESRD - TTS per routine. istat K better.  no heparin dialysis;continue to follow K.  3 Hypertension/volume - requesting med list from her pharmacy ([facility]) to verify meds  4. Anemia - ESA on hold. Recheck CBC now with blood loss as above (750 loss; 310 returned through cell saver)  5. Metabolic bone disease - S/p parathyroidectomy 2002  6. Nutrition -  renal diet     LOS: 1 day   Krishon Adkison A 01/31/2011,10:33 AM

## 2011-01-31 NOTE — Progress Notes (Signed)
Spoke with patient and family. Rolling walker and 3in1 ordered.

## 2011-02-01 ENCOUNTER — Inpatient Hospital Stay (HOSPITAL_COMMUNITY): Payer: Medicare Other

## 2011-02-01 ENCOUNTER — Encounter (HOSPITAL_COMMUNITY): Payer: Self-pay | Admitting: General Practice

## 2011-02-01 ENCOUNTER — Inpatient Hospital Stay (HOSPITAL_COMMUNITY)
Admission: RE | Admit: 2011-02-01 | Discharge: 2011-02-09 | DRG: 945 | Disposition: A | Discharge status: Other Acute Inpatient Hospital | Payer: Medicare Other | Source: Ambulatory Visit | Attending: Physical Medicine & Rehabilitation | Admitting: Physical Medicine & Rehabilitation

## 2011-02-01 ENCOUNTER — Encounter (HOSPITAL_COMMUNITY): Payer: Self-pay

## 2011-02-01 DIAGNOSIS — Z87891 Personal history of nicotine dependence: Secondary | ICD-10-CM

## 2011-02-01 DIAGNOSIS — I120 Hypertensive chronic kidney disease with stage 5 chronic kidney disease or end stage renal disease: Secondary | ICD-10-CM

## 2011-02-01 DIAGNOSIS — Z992 Dependence on renal dialysis: Secondary | ICD-10-CM

## 2011-02-01 DIAGNOSIS — N186 End stage renal disease: Secondary | ICD-10-CM

## 2011-02-01 DIAGNOSIS — R509 Fever, unspecified: Secondary | ICD-10-CM

## 2011-02-01 DIAGNOSIS — B1920 Unspecified viral hepatitis C without hepatic coma: Secondary | ICD-10-CM

## 2011-02-01 DIAGNOSIS — Z905 Acquired absence of kidney: Secondary | ICD-10-CM

## 2011-02-01 DIAGNOSIS — M48061 Spinal stenosis, lumbar region without neurogenic claudication: Secondary | ICD-10-CM

## 2011-02-01 DIAGNOSIS — N2581 Secondary hyperparathyroidism of renal origin: Secondary | ICD-10-CM

## 2011-02-01 DIAGNOSIS — K55039 Acute (reversible) ischemia of large intestine, extent unspecified: Secondary | ICD-10-CM | POA: Diagnosis not present

## 2011-02-01 DIAGNOSIS — K5900 Constipation, unspecified: Secondary | ICD-10-CM

## 2011-02-01 DIAGNOSIS — Z5189 Encounter for other specified aftercare: Principal | ICD-10-CM

## 2011-02-01 DIAGNOSIS — E119 Type 2 diabetes mellitus without complications: Secondary | ICD-10-CM

## 2011-02-01 DIAGNOSIS — D631 Anemia in chronic kidney disease: Secondary | ICD-10-CM

## 2011-02-01 DIAGNOSIS — Z91041 Radiographic dye allergy status: Secondary | ICD-10-CM

## 2011-02-01 DIAGNOSIS — M4310 Spondylolisthesis, site unspecified: Secondary | ICD-10-CM

## 2011-02-01 DIAGNOSIS — M792 Neuralgia and neuritis, unspecified: Secondary | ICD-10-CM | POA: Insufficient documentation

## 2011-02-01 DIAGNOSIS — IMO0002 Reserved for concepts with insufficient information to code with codable children: Secondary | ICD-10-CM

## 2011-02-01 DIAGNOSIS — Z981 Arthrodesis status: Secondary | ICD-10-CM

## 2011-02-01 DIAGNOSIS — K219 Gastro-esophageal reflux disease without esophagitis: Secondary | ICD-10-CM

## 2011-02-01 DIAGNOSIS — Q762 Congenital spondylolisthesis: Secondary | ICD-10-CM

## 2011-02-01 DIAGNOSIS — Z7682 Awaiting organ transplant status: Secondary | ICD-10-CM

## 2011-02-01 DIAGNOSIS — M216X9 Other acquired deformities of unspecified foot: Secondary | ICD-10-CM

## 2011-02-01 DIAGNOSIS — K559 Vascular disorder of intestine, unspecified: Secondary | ICD-10-CM

## 2011-02-01 DIAGNOSIS — N189 Chronic kidney disease, unspecified: Secondary | ICD-10-CM | POA: Diagnosis present

## 2011-02-01 LAB — CBC
Hemoglobin: 9.3 g/dL — ABNORMAL LOW (ref 12.0–15.0)
MCHC: 33 g/dL (ref 30.0–36.0)
Platelets: 136 10*3/uL — ABNORMAL LOW (ref 150–400)
RDW: 15 % (ref 11.5–15.5)

## 2011-02-01 LAB — RENAL FUNCTION PANEL
Albumin: 2.9 g/dL — ABNORMAL LOW (ref 3.5–5.2)
GFR calc Af Amer: 6 mL/min — ABNORMAL LOW (ref >90)
GFR calc non Af Amer: 5 mL/min — ABNORMAL LOW (ref >90)
Glucose, Bld: 101 mg/dL — ABNORMAL HIGH (ref 70–99)
Phosphorus: 3.8 mg/dL (ref 2.3–4.6)
Potassium: 4.1 mEq/L (ref 3.5–5.1)
Sodium: 134 mEq/L — ABNORMAL LOW (ref 135–145)

## 2011-02-01 MED ORDER — PROMETHAZINE HCL 25 MG RE SUPP: 25.0000 mg | Freq: Two times a day (BID) | RECTAL | Status: DC | PRN

## 2011-02-01 MED ORDER — HYDROCODONE-ACETAMINOPHEN 5-325 MG PO TABS
1.0000 | ORAL_TABLET | ORAL | Status: DC | PRN
  Administered 2011-02-01: 2 via ORAL
  Filled 2011-02-01: qty 2

## 2011-02-01 MED ORDER — METHOCARBAMOL 500 MG PO TABS
500.0000 mg | ORAL_TABLET | Freq: Four times a day (QID) | ORAL | Status: DC | PRN
  Administered 2011-02-01 – 2011-02-09 (×10): 500 mg via ORAL
  Filled 2011-02-01 (×10): qty 1

## 2011-02-01 MED ORDER — ALUM & MAG HYDROXIDE-SIMETH 400-400-40 MG/5ML PO SUSP
30.0000 mL | Freq: Four times a day (QID) | ORAL | Status: DC | PRN
  Administered 2011-02-04 – 2011-02-06 (×2): 30 mL via ORAL
  Filled 2011-02-01 (×3): qty 30

## 2011-02-01 MED ORDER — CALCIUM CARBONATE ANTACID 500 MG PO CHEW
1.0000 | CHEWABLE_TABLET | Freq: Three times a day (TID) | ORAL | Status: DC
  Administered 2011-02-01 – 2011-02-02 (×5): 400 mg via ORAL
  Filled 2011-02-01 (×9): qty 2

## 2011-02-01 MED ORDER — OXYCODONE-ACETAMINOPHEN 5-325 MG PO TABS
1.0000 | ORAL_TABLET | ORAL | Status: DC | PRN
  Administered 2011-02-01 – 2011-02-02 (×3): 2 via ORAL
  Filled 2011-02-01 (×3): qty 2

## 2011-02-01 MED ORDER — ACETAMINOPHEN 650 MG RE SUPP: 650.0000 mg | RECTAL | Status: DC | PRN

## 2011-02-01 MED ORDER — PHENOL 1.4 % MT LIQD
1.0000 | OROMUCOSAL | Status: DC | PRN
  Filled 2011-02-01: qty 177

## 2011-02-01 MED ORDER — ENOXAPARIN SODIUM 40 MG/0.4ML ~~LOC~~ SOLN
40.0000 mg | SUBCUTANEOUS | Status: DC
  Administered 2011-02-01 – 2011-02-03 (×3): 40 mg via SUBCUTANEOUS
  Filled 2011-02-01 (×4): qty 0.4

## 2011-02-01 MED ORDER — ACETAMINOPHEN 325 MG PO TABS
650.0000 mg | ORAL_TABLET | ORAL | Status: DC | PRN
  Administered 2011-02-03 – 2011-02-08 (×5): 650 mg via ORAL
  Filled 2011-02-01 (×5): qty 2

## 2011-02-01 MED ORDER — NEPRO/CARBSTEADY PO LIQD
237.0000 mL | Freq: Three times a day (TID) | ORAL | Status: DC | PRN
  Administered 2011-02-05: 237 mL via ORAL

## 2011-02-01 MED ORDER — METOPROLOL TARTRATE 25 MG PO TABS
25.0000 mg | ORAL_TABLET | Freq: Two times a day (BID) | ORAL | Status: DC
  Administered 2011-02-01: 25 mg via ORAL
  Filled 2011-02-01 (×4): qty 1

## 2011-02-01 MED ORDER — MENTHOL 3 MG MT LOZG
1.0000 | LOZENGE | OROMUCOSAL | Status: DC | PRN
  Filled 2011-02-01: qty 9

## 2011-02-01 MED ORDER — GUAIFENESIN-DM 100-10 MG/5ML PO SYRP: 5.0000 mL | ORAL_SOLUTION | Freq: Four times a day (QID) | ORAL | Status: DC | PRN

## 2011-02-01 MED ORDER — SENNOSIDES-DOCUSATE SODIUM 8.6-50 MG PO TABS
2.0000 | ORAL_TABLET | Freq: Every day | ORAL | Status: DC
  Administered 2011-02-01 – 2011-02-07 (×7): 2 via ORAL
  Filled 2011-02-01 (×9): qty 2

## 2011-02-01 MED ORDER — PROMETHAZINE HCL 12.5 MG PO TABS
12.5000 mg | ORAL_TABLET | Freq: Four times a day (QID) | ORAL | Status: DC | PRN
  Administered 2011-02-01: 12.5 mg via ORAL
  Filled 2011-02-01: qty 1

## 2011-02-01 MED FILL — Heparin Sodium (Porcine) Inj 1000 Unit/ML: INTRAMUSCULAR | Qty: 30 | Status: AC

## 2011-02-01 MED FILL — Sodium Chloride Irrigation Soln 0.9%: Qty: 3000 | Status: AC

## 2011-02-01 MED FILL — Sodium Chloride IV Soln 0.9%: INTRAVENOUS | Qty: 1000 | Status: AC

## 2011-02-01 NOTE — Progress Notes (Addendum)
Pt c/o increased numbness to her BLE; sts "something feels wrong with my legs and my back", MD notified. Per MD Stat CT of lumbar spine ordered, pt transported to radiology. Will continue to monitor while awaiting possible orders and scan results.

## 2011-02-01 NOTE — Consult Note (Signed)
Physical Medicine and Rehabilitation Consult Reason for Consult:lumbar radiculopathy Referring Phsyician: Dr.Nudelman [name] is an 60 y.o. female.   HPI: 60 year old black female admitted 01/30/2011 with increased low back pain since August of 2012 and progressive right foot drop. X-rays and imaging showed a grade 2 dynamic degenerative spondylolisthesis of lumbar L4 and lumbar L5. MRI revealed severe multi-factorial spinal stenosis at lumbar L4-L5 and marked to severe multifactorial stenosis at lumbar 5 and S1. Patient underwent lumbar L4 and S1 lumbar laminectomy fasciectomy and foraminotomies with decompression as well as lumbar 4 and 5 and lumbar L5 and S1 posterior lumbar interbody arthrodesis on 1114 per Dr. Sherwood Gambler. She was fitted with a back brace when out of bed. Noted on early morning of November 16 was some increased foot drop and followup per Dr. Sherley Bounds MRI completed showing no obvious hematoma. Patient currently requiring moderate assistance to ambulate  60 feet with a rolling walker. Therapies noted decrease in balance and knees buckling secondary to fatigue. Inpatient rehabilitation services was consulted at the request of physical and occupational therapy for consideration of inpatient rehabilitation services.  Review of Systems  Constitutional: Positive for malaise/fatigue.  Eyes: Negative for blurred vision.  Respiratory: Negative.   Cardiovascular: Negative for chest pain and leg swelling.  Gastrointestinal: Positive for constipation. Negative for nausea.  Genitourinary: Negative for urgency.  Musculoskeletal: Positive for back pain.  Neurological: Positive for weakness. Negative for dizziness, tremors and headaches.  Psychiatric/Behavioral: Negative for depression.   Past Medical History  Diagnosis Date  . Seizures     r/t HTN in 1990's x 1  . Brain aneurysm   . Blood transfusion 1990's    r/t Kidney removal surgery  . Arthritis     Back  . Anemia   .  Umbilical hernia age 53  . Heart murmur     Born with heart mumur, does not require follow up per pt  . Hypertension     Does not see a heart doctor, had pre transplant stress test at [facility]    . Diabetes mellitus     Borderline  . End stage renal disease on dialysis     T/Th/Sat dialysis on [employer]  . End stage renal disease on dialysis     01-29-2011  . Hepatitis C    Past Surgical History  Procedure Date  . Nephrectomy 2010    right side done at [facility] on transplant list  . Umbilical hernia repair age 19- 37  . Appendectomy 1960's  . Vascular surgery     right arm dialysis graft  . Tonsillectomy     as a child  . Rib resection     d/t kidney removal   Family History  Problem Relation Age of Onset  . Anesthesia problems Neg Hx    Social History:  reports that she quit smoking about 5 months ago. Her smoking use included Cigarettes. She has a 24 pack-year smoking history. She has never used smokeless tobacco. She reports that she drinks about 1.2 ounces of alcohol per week. She reports that she does not use illicit drugs. Allergies:  Allergies  Allergen Reactions  . Contrast Media (Iodinated Diagnostic Agents)     Seziure  . Iohexol Other (See Comments)    Reaction is convulsions   Medications Prior to Admission  Medication Dose Route Frequency Provider Last Rate Last Dose  . 0.9 %  sodium chloride infusion  100 mL Intravenous PRN Myriam Jacobson, PA      .  0.9 %  sodium chloride infusion  100 mL Intravenous PRN Myriam Jacobson, PA      . acetaminophen (TYLENOL) tablet 650 mg  650 mg Oral Q4H PRN Hosie Spangle       Or  . acetaminophen (TYLENOL) suppository 650 mg  650 mg Rectal Q4H PRN Hosie Spangle      . alteplase (CATHFLO ACTIVASE) injection 2 mg  2 mg Intracatheter Once PRN Myriam Jacobson, PA      . alum & mag hydroxide-simeth (MAALOX PLUS) 400-400-40 MG/5ML suspension 30 mL  30 mL Oral Q6H PRN Hosie Spangle      . calcium carbonate  (dosed in mg elemental calcium) suspension 500 mg of elemental calcium  500 mg of elemental calcium Oral Q6H PRN Myriam Jacobson, PA      . calcium carbonate (TUMS - dosed in mg elemental calcium) chewable tablet 200-400 mg of elemental calcium  1-2 tablet Oral TID Okey Regal Nudelman   400 mg of elemental calcium at 01/31/11 1621  . camphor-menthol (SARNA) lotion 1 application  1 application Topical [id number] PRN Myriam Jacobson, PA       And  . hydrOXYzine (ATARAX/VISTARIL) tablet 25 mg  25 mg Oral Q8H PRN Myriam Jacobson, PA      . ceFAZolin (ANCEF) IVPB 1 g/50 mL premix  1 g Intravenous 60 min Pre-Op Okey Regal Nudelman   1 g at 01/30/11 1815  . cyclobenzaprine (FLEXERIL) tablet 10 mg  10 mg Oral TID PRN Okey Regal Nudelman   10 mg at 01/31/11 0504  . docusate sodium (COLACE) capsule 100 mg  100 mg Oral BID Okey Regal Nudelman   100 mg at 01/31/11 2215  . docusate sodium (ENEMEEZ) enema 283 mg  1 enema Rectal PRN Myriam Jacobson, PA      . feeding supplement (NEPRO CARB STEADY) liquid 237 mL  237 mL Oral TID PRN Myriam Jacobson, PA      . heparin injection 1,000 Units  1,000 Units Dialysis PRN Myriam Jacobson, PA      . HYDROcodone-acetaminophen (NORCO) 5-325 MG per tablet 1-2 tablet  1-2 tablet Oral Q4H PRN Hosie Spangle   2 tablet at 01/31/11 1636  . hydrOXYzine (ATARAX/VISTARIL) tablet 50 mg  50 mg Oral Q3H PRN Hosie Spangle       Or  . hydrOXYzine (VISTARIL) injection 50 mg  50 mg Intramuscular Q3H PRN Hosie Spangle      . ketorolac (TORADOL) 30 MG/ML injection 30 mg  30 mg Intravenous Q6H Robert W Nudelman   30 mg at 02/01/11 0739  . lidocaine (XYLOCAINE) 1 % injection 5 mL  5 mL Intradermal PRN Myriam Jacobson, PA      . lidocaine-prilocaine (EMLA) cream 1 application  1 application Topical PRN Myriam Jacobson, PA      . LORazepam (ATIVAN) injection 0.5 mg  0.5 mg Intravenous Once Lucrezia Starch, MD   0.5 mg at 01/31/11 0015  . menthol-cetylpyridinium (CEPACOL) lozenge 3  mg  1 lozenge Oral PRN Hosie Spangle       Or  . phenol (CHLORASEPTIC) mouth spray 1 spray  1 spray Mouth/Throat PRN Hosie Spangle      . metoprolol tartrate (LOPRESSOR) tablet 25 mg  25 mg Oral BID Hosie Spangle      . morphine 4 MG/ML injection 4-8 mg  4-8 mg Intramuscular Q3H PRN Hosie Spangle  4 mg at 02/01/11 0217  . ondansetron (ZOFRAN) injection 4 mg  4 mg Intravenous Once PRN Warrick Parisian, MD      . oxyCODONE-acetaminophen [facility]) 5-325 MG per tablet 1-2 tablet  1-2 tablet Oral Q4H PRN Hosie Spangle   2 tablet at 01/31/11 1004  . pentafluoroprop-tetrafluoroeth (GEBAUERS) aerosol 1 application  1 application Topical PRN Myriam Jacobson, PA      . promethazine (PHENERGAN) tablet 12.5 mg  12.5 mg Oral Q6H PRN Myriam Jacobson, PA       Or  . promethazine (PHENERGAN) suppository 25 mg  25 mg Rectal BID PRN Myriam Jacobson, PA      . sorbitol 70 % solution 30 mL  30 mL Oral PRN Myriam Jacobson, PA      . white petrolatum (VASELINE) gel           . zolpidem (AMBIEN) tablet 5 mg  5 mg Oral QHS PRN Hosie Spangle      . DISCONTD: 0.45 % sodium chloride infusion   Intravenous Continuous Okey Regal Nudelman 30 mL/hr at 01/31/11 0600    . DISCONTD: 0.9 %  sodium chloride infusion  250 mL Intravenous Continuous Hosie Spangle      . DISCONTD: 50,000 units bacitracin in 0.9% normal saline 250 mL irrigation    PRN Hosie Spangle      . DISCONTD: acetaminophen (TYLENOL) suppository 650 mg  650 mg Rectal Q6H PRN Myriam Jacobson, PA      . DISCONTD: acetaminophen (TYLENOL) tablet 650 mg  650 mg Oral Q6H PRN Myriam Jacobson, PA      . DISCONTD: bupivacaine (MARCAINE) 0.5 % injection    PRN Okey Regal Nudelman   20 mL at 01/30/11 1455  . DISCONTD: diphenhydrAMINE (BENADRYL) 12.5 MG/5ML elixir 12.5 mg  12.5 mg Oral Q6H PRN Hosie Spangle      . DISCONTD: diphenhydrAMINE (BENADRYL) injection 12.5 mg  12.5 mg Intravenous Q6H PRN Hosie Spangle      .  DISCONTD: hemostatic agents    PRN Hosie Spangle   1 application at [id number]  . DISCONTD: HYDROmorphone (DILAUDID) injection 0.25-0.5 mg  0.25-0.5 mg Intravenous Q5 min PRN Warrick Parisian, MD   0.5 mg at 01/30/11 1942  . DISCONTD: ketorolac (TORADOL) 30 MG/ML injection 30 mg  30 mg Intravenous Once [employer]      . DISCONTD: lidocaine-EPINEPHrine (XYLOCAINE W/EPI) 1 %-1:100000 (with pres) injection    PRN Okey Regal Nudelman   20 mL at 01/30/11 1455  . DISCONTD: LORazepam (ATIVAN) 2 MG/ML injection           . DISCONTD: morphine 1 MG/ML PCA injection   Intravenous Q4H Hosie Spangle      . DISCONTD: naloxone [facility]) injection 0.4 mg  0.4 mg Intravenous PRN Hosie Spangle      . DISCONTD: ondansetron (ZOFRAN) injection 4 mg  4 mg Intravenous Q6H PRN Hosie Spangle      . DISCONTD: sodium chloride 0.9 % injection 3 mL  3 mL Intravenous Q12H Hosie Spangle      . DISCONTD: sodium chloride 0.9 % injection 3 mL  3 mL Intravenous PRN Hosie Spangle      . DISCONTD: sodium chloride 0.9 % injection 9 mL  9 mL Intravenous PRN Hosie Spangle      . DISCONTD: sodium chloride irrigation 0.9 %    PRN Okey Regal  Nudelman   1,000 mL at 01/30/11 1455  . DISCONTD: sodium chloride irrigation 0.9 %    PRN Okey Regal Nudelman   1,000 mL at 01/30/11 1739  . DISCONTD: thrombin spray    PRN Okey Regal Nudelman   20,000 Units at 01/30/11 1455  . DISCONTD: zolpidem (AMBIEN) tablet 5 mg  5 mg Oral QHS PRN Myriam Jacobson, PA       Medications Prior to Admission  Medication Sig Dispense Refill  . hydrOXYzine (ATARAX/VISTARIL) 25 MG tablet Take 25 mg by mouth every 6 (six) hours as needed.          Home: Home Living Lives With: Alone Receives Help From: Other (Comment) (Daughter and friends to come and stay.) Type of Home: House Home Layout: One level Home Access: Stairs to enter Entrance Stairs-Rails: Right Entrance Stairs-Number of Steps: 2 Home Adaptive Equipment: None    Functional History: Prior Function Level of Independence: Independent with basic ADLs;Independent with homemaking with ambulation;Independent with gait;Independent with transfers Able to Take Stairs?: Yes Driving: Yes Functional Status:  Mobility: Bed Mobility Bed Mobility: Yes Rolling Left: 4: Min assist Rolling Left Details (indicate cue type and reason): Assist to facilitate rotation of trunk with verbal cues for sequence. Left Sidelying to Sit: 4: Min assist;HOB flat;With rails Left Sidelying to Sit Details (indicate cue type and reason): Assist to translate trunk to midline with cues for sequence. Transfers Transfers: Yes Sit to Stand: 4: Min assist;From bed Sit to Stand Details (indicate cue type and reason): Assist to balance with cues for hand placement and safety. Stand to Sit: 4: Min assist;To chair/3-in-1 Stand to Sit Details: Assist to eccentric control of trunk with cues for safest hand placement. Ambulation/Gait Ambulation/Gait: Yes Ambulation/Gait Assistance: 3: Mod assist Ambulation/Gait Assistance Details (indicate cue type and reason): Assist for balance secondary to left lean with decreased strength of bilateral LEs (with bilateral knees often buckling especially with fatigue).  Cues to stay inside RW and extend trunk.  Scissoring at feet.  Cues for heel strike throughout distance. Ambulation Distance (Feet): 60 Feet Assistive device: Rolling walker Gait Pattern: Decreased step length - right;Decreased step length - left;Decreased dorsiflexion - right;Decreased dorsiflexion - left;Right steppage;Left steppage;Scissoring;Trunk flexed Stairs: No Wheelchair Mobility Wheelchair Mobility: No  ADL:    Cognition: Cognition Arousal/Alertness: Awake/alert Orientation Level: Oriented X4 Cognition Arousal/Alertness: Awake/alert Overall Cognitive Status: Appears within functional limits for tasks assessed Orientation Level: Oriented X4  Blood pressure 91/55, pulse  106, temperature 98.1 F (36.7 C), temperature source Oral, resp. rate 18, height 5\' 4"  (1.626 m), weight 60.2 kg (132 lb 11.5 oz), SpO2 98.00%. Physical Exam  Constitutional: She is oriented to person, place, and time. She appears well-developed.  HENT:  Head: Normocephalic.  Neck: Normal range of motion. Neck supple.  Cardiovascular: Normal rate.   No murmur heard. Pulmonary/Chest: Effort normal.  Neurological: She is alert and oriented to person, place, and time.  Skin:       Back incision dressed/clean/dry.  Psychiatric: She has a normal mood and affect.    No results found for this or any previous visit (from the past 24 hour(s)). Dg Lumbar Spine 2-3 Views  01/30/2011  *RADIOLOGY REPORT*  Clinical Data: 60 year old female undergoing lumbar spine surgery.  LUMBAR SPINE - 2-3 VIEW  Comparison: Intraoperative radiographs from 1450 hours the same day and earlier.  Fluoroscopy time of 0.5 minutes was utilized.  Findings: Intraoperative frontal and lateral fluoroscopic views of the lower lumbar spine demonstrate bilateral transpedicular  hardware placement at L4, L5, and S1.  Interbody implant at L4-L5 and L5-S1.  Anterolisthesis of L4 on L5 re-identified.  IMPRESSION: Posterior and interbody fusion depicted at L4-L5 and L5-S1.  Original Report Authenticated By: Randall An, M.D.   Dg Lumbar Spine 2-3 Views  01/30/2011  *RADIOLOGY REPORT*  Clinical Data: For S1 PLIF  LUMBAR SPINE - 2-3 VIEW  Comparison: 11/07/2010  Findings: Initial film shows a needle at the level of the spinous process of L4.  Second film shows tissue spreaders posteriorly with probes at the L4 and L5 laminar levels.  IMPRESSION: L4 and L5 laminar levels localized.  Original Report Authenticated By: Jules Schick, M.D.   Ct Lumbar Spine Wo Contrast  02/01/2011  *RADIOLOGY REPORT*  Clinical Data: Status post lumbar spinal surgery on 01/30/2011; now cannot lift legs, though was able to ambulate earlier in the day.  Evaluate for hematoma.  CT LUMBAR SPINE WITHOUT CONTRAST  Technique:  Multidetector CT imaging of the lumbar spine was performed without intravenous contrast administration. Multiplanar CT image reconstructions were also generated.  Comparison: MRI of the lumbar spine performed 11/07/2010.  Findings: No definite significant focal hematoma is characterized. Scattered foci of air are seen along the lumbar spinal canal, suggesting against significant compression.  At the level of the decompression, significant soft tissue edema is seen; there is no definite evidence of compression of the exiting nerve roots at L4- L5 or L5-S1, though a broad-based disc protrusion is again noted at L4-L5, with persistent grade 1 anterolisthesis at this level and effacement of the foraminal fat.  Soft tissue air is noted tracking along the paraspinal musculature, with underlying edema.  Inflammation and edema are noted within the superficial soft tissues along the back.  Evaluation of the spinal canal at the level of surgery is suboptimal due to metal artifact. The lumbar spinal fusion rods at L4-S1, with associated interbody spacers, are grossly unremarkable in appearance.  There is diffuse calcification along the abdominal aorta and its branches.  The patient appears status post right-sided nephrectomy; numerous large cysts are noted arising at the left kidney.  Mild scattered diverticulosis is noted along the sigmoid colon.  IMPRESSION:  1.  No definite significant focal hematoma seen. 2.  Lumbar spinal fusion is grossly unremarkable in appearance. 3.  Soft tissue edema noted at the level of decompression; no definite evidence of impression on the exiting nerve roots at L4-L5 or L5-S1, though there is persistent grade 1 anterolisthesis at L4- L5 with a broad-based disc protrusion causing effacement of foraminal fat bilaterally. 4.  Scattered air tracking along the paraspinal musculature and along the lumbar spinal canal. 5.  Diffuse  calcification along the abdominal aorta and branches. 6.  Status post right-sided nephrectomy; numerous large cysts arising at the left kidney. 7.  Mild scattered diverticulosis along the sigmoid colon.  Original Report Authenticated By: Santa Lighter, M.D.   Dg C-arm 1-60 Min  01/30/2011  CLINICAL DATA: L4-5, L5-S1 PLIF   C-ARM 1-60 MINUTES  Fluoroscopy was utilized by the requesting physician.  No radiographic  interpretation.      Assessment/Plan: Diagnosis: lumbar stenosis and radiculopathies s/p decompression and fusion.  Patient with bilateral foot drop. 1. Does the need for close, 24 hr/day medical supervision in concert with the patient's rehab needs make it unreasonable for this patient to be served in a less intensive setting? Yes 2. Co-Morbidities requiring supervision/potential complications: see above 3. Due to bladder management, bowel management, skin/wound care, disease management  and pain management, does the patient require 24 hr/day rehab nursing? Yes 4. Does the patient require coordinated care of a physician, rehab nurse, PT (1-2 hrs/day, 5 days/week) and OT (1-2 hrs/day, 5 days/week) to address physical and functional deficits in the context of the above medical diagnosis(es)? Yes Addressing deficits in the following areas: balance, bathing, bowel/bladder control, endurance, locomotion, toileting and transferring 5. Can the patient actively participate in an intensive therapy program of at least 3 hrs of therapy per day at least 5 days per week? Yes 6. The potential for patient to make measurable gains while on inpatient rehab is excellent 7. Anticipated functional outcomes upon discharge from inpatients are mod Ind PT, mod ind OT 8. Estimated rehab length of stay to reach the above functional goals is: 2 weeks 9. Does the patient have adequate social supports to accommodate these discharge functional goals? Yes 10. Anticipated D/C setting: Home 11. Anticipated post D/C  treatments: [facility] therapy 12. Overall Rehab/Functional Prognosis: excellent  RECOMMENDATIONS: This patient's condition is appropriate for continued rehabilitative care in the following setting: CIR Patient has agreed to participate in recommended program. Yes Note that insurance prior authorization may be required for reimbursement for recommended care.  Comment:   ANGIULLI,DANIEL J. 02/01/2011

## 2011-02-01 NOTE — H&P (Signed)
Physical Medicine and Rehabilitation Admission H&P HPI: 60 year old black female with history ESRD  admitted 01/30/2011 with increased low back pain since August of 2012 and progressive right foot drop. X-rays and imaging showed a grade 2 dynamic degenerative spondylolisthesis of lumbar L4 and lumbar L5. MRI revealed severe multi-factorial spinal stenosis at lumbar L4-L5 and marked to severe multifactorial stenosis at lumbar 5 and S1. Patient underwent lumbar L4 and S1 lumbar laminectomy fasciectomy and foraminotomies with decompression as well as lumbar 4 and 5 and lumbar L5 and S1 posterior lumbar interbody arthrodesis on 11/14 per Dr. Sherwood Gambler. She was fitted with a back brace when out of bed. Noted on early morning of November 16 was some increased foot drop and followup per Dr. Sherley Bounds MRI completed showing no obvious hematoma.Dr.Nudelman felt  may be due to some cord inflammation but no current plan for decadron at present. Patient currently requiring moderate assistance to ambulate 60 feet with a rolling walker. Therapies noted decrease in balance and knees buckling secondary to fatigue.On going dialysis as directed. Inpatient rehabilitation services was consulted at the request of physical and occupational therapy for consideration of inpatient rehabilitation services.   Review of Systems  Constitutional: Positive for malaise/fatigue.  Eyes: Negative for blurred vision.  Respiratory: Negative.  Cardiovascular: Negative for chest pain and leg swelling.  Gastrointestinal: Positive for constipation. Negative for nausea.  Genitourinary: Negative for urgency.  Musculoskeletal: Positive for back pain.  Neurological: Positive for weakness. Negative for dizziness, tremors and headaches.  Psychiatric/Behavioral: Negative for depression      Past Medical History  Diagnosis Date  . Seizures     r/t HTN in 1990's x 1  . Brain aneurysm   . Blood transfusion 1990's    r/t Kidney removal surgery   . Arthritis     Back  . Anemia   . Umbilical hernia age 20  . Heart murmur     Born with heart mumur, does not require follow up per pt  . Hypertension     Does not see a heart doctor, had pre transplant stress test at [facility]    . Diabetes mellitus     Borderline  . End stage renal disease on dialysis     T/Th/Sat dialysis on [employer]  . End stage renal disease on dialysis     01-29-2011  . Hepatitis C    Past Surgical History  Procedure Date  . Nephrectomy 2010    right side done at [facility] on transplant list  . Umbilical hernia repair age 80- 61  . Appendectomy 1960's  . Vascular surgery     right arm dialysis graft  . Tonsillectomy     as a child  . Rib resection     d/t kidney removal   Family History  Problem Relation Age of Onset  . Anesthesia problems Neg Hx    Social History:  reports that she quit smoking about 5 months ago. Her smoking use included Cigarettes. She has a 24 pack-year smoking history. She has never used smokeless tobacco. She reports that she drinks about 1.2 ounces of alcohol per week. She reports that she does not use illicit drugs. Allergies:  Allergies  Allergen Reactions  . Contrast Media (Iodinated Diagnostic Agents)     Seziure  . Iohexol Other (See Comments)    Reaction is convulsions   Medications Prior to Admission  Medication Dose Route Frequency Provider Last Rate Last Dose  . 0.9 %  sodium chloride infusion  100 mL Intravenous PRN Myriam Jacobson, PA      . 0.9 %  sodium chloride infusion  100 mL Intravenous PRN Myriam Jacobson, PA      . acetaminophen (TYLENOL) tablet 650 mg  650 mg Oral Q4H PRN Hosie Spangle       Or  . acetaminophen (TYLENOL) suppository 650 mg  650 mg Rectal Q4H PRN Hosie Spangle      . alteplase (CATHFLO ACTIVASE) injection 2 mg  2 mg Intracatheter Once PRN Myriam Jacobson, PA      . alum & mag hydroxide-simeth (MAALOX PLUS) 400-400-40 MG/5ML suspension 30 mL  30 mL Oral Q6H PRN Hosie Spangle      . calcium carbonate (dosed in mg elemental calcium) suspension 500 mg of elemental calcium  500 mg of elemental calcium Oral Q6H PRN Myriam Jacobson, PA      . calcium carbonate (TUMS - dosed in mg elemental calcium) chewable tablet 200-400 mg of elemental calcium  1-2 tablet Oral TID Okey Regal Nudelman   400 mg of elemental calcium at 02/01/11 0941  . camphor-menthol (SARNA) lotion 1 application  1 application Topical [id number] PRN Myriam Jacobson, PA       And  . hydrOXYzine (ATARAX/VISTARIL) tablet 25 mg  25 mg Oral Q8H PRN Myriam Jacobson, PA      . ceFAZolin (ANCEF) IVPB 1 g/50 mL premix  1 g Intravenous 60 min Pre-Op Okey Regal Nudelman   1 g at 01/30/11 1815  . cyclobenzaprine (FLEXERIL) tablet 10 mg  10 mg Oral TID PRN Okey Regal Nudelman   10 mg at 01/31/11 0504  . docusate sodium (COLACE) capsule 100 mg  100 mg Oral BID Okey Regal Nudelman   100 mg at 02/01/11 0941  . docusate sodium (ENEMEEZ) enema 283 mg  1 enema Rectal PRN Myriam Jacobson, PA      . feeding supplement (NEPRO CARB STEADY) liquid 237 mL  237 mL Oral TID PRN Myriam Jacobson, PA      . heparin injection 1,000 Units  1,000 Units Dialysis PRN Myriam Jacobson, PA      . HYDROcodone-acetaminophen (NORCO) 5-325 MG per tablet 1-2 tablet  1-2 tablet Oral Q4H PRN Hosie Spangle   2 tablet at 01/31/11 1636  . hydrOXYzine (ATARAX/VISTARIL) tablet 50 mg  50 mg Oral Q3H PRN Hosie Spangle       Or  . hydrOXYzine (VISTARIL) injection 50 mg  50 mg Intramuscular Q3H PRN Hosie Spangle      . ketorolac (TORADOL) 30 MG/ML injection 30 mg  30 mg Intravenous Q6H Robert W Nudelman   30 mg at 02/01/11 0739  . lidocaine (XYLOCAINE) 1 % injection 5 mL  5 mL Intradermal PRN Myriam Jacobson, PA      . lidocaine-prilocaine (EMLA) cream 1 application  1 application Topical PRN Myriam Jacobson, PA      . LORazepam (ATIVAN) injection 0.5 mg  0.5 mg Intravenous Once Lucrezia Starch, MD   0.5 mg at 01/31/11 0015  .  menthol-cetylpyridinium (CEPACOL) lozenge 3 mg  1 lozenge Oral PRN Hosie Spangle       Or  . phenol (CHLORASEPTIC) mouth spray 1 spray  1 spray Mouth/Throat PRN Hosie Spangle      . metoprolol tartrate (LOPRESSOR) tablet 25 mg  25 mg Oral BID Hosie Spangle      . morphine 4 MG/ML  injection 4-8 mg  4-8 mg Intramuscular Q3H PRN Okey Regal Nudelman   4 mg at 02/01/11 0217  . ondansetron (ZOFRAN) injection 4 mg  4 mg Intravenous Once PRN Warrick Parisian, MD      . oxyCODONE-acetaminophen [facility]) 5-325 MG per tablet 1-2 tablet  1-2 tablet Oral Q4H PRN Hosie Spangle   1 tablet at 02/01/11 0908  . pentafluoroprop-tetrafluoroeth (GEBAUERS) aerosol 1 application  1 application Topical PRN Myriam Jacobson, PA      . promethazine (PHENERGAN) tablet 12.5 mg  12.5 mg Oral Q6H PRN Myriam Jacobson, PA       Or  . promethazine (PHENERGAN) suppository 25 mg  25 mg Rectal BID PRN Myriam Jacobson, PA      . sorbitol 70 % solution 30 mL  30 mL Oral PRN Myriam Jacobson, PA      . white petrolatum (VASELINE) gel           . zolpidem (AMBIEN) tablet 5 mg  5 mg Oral QHS PRN Hosie Spangle      . DISCONTD: 0.45 % sodium chloride infusion   Intravenous Continuous Okey Regal Nudelman 30 mL/hr at 01/31/11 0600    . DISCONTD: 0.9 %  sodium chloride infusion  250 mL Intravenous Continuous Hosie Spangle      . DISCONTD: 50,000 units bacitracin in 0.9% normal saline 250 mL irrigation    PRN Hosie Spangle      . DISCONTD: acetaminophen (TYLENOL) suppository 650 mg  650 mg Rectal Q6H PRN Myriam Jacobson, PA      . DISCONTD: acetaminophen (TYLENOL) tablet 650 mg  650 mg Oral Q6H PRN Myriam Jacobson, PA      . DISCONTD: bupivacaine (MARCAINE) 0.5 % injection    PRN Okey Regal Nudelman   20 mL at 01/30/11 1455  . DISCONTD: diphenhydrAMINE (BENADRYL) 12.5 MG/5ML elixir 12.5 mg  12.5 mg Oral Q6H PRN Hosie Spangle      . DISCONTD: diphenhydrAMINE (BENADRYL) injection 12.5 mg  12.5 mg  Intravenous Q6H PRN Hosie Spangle      . DISCONTD: hemostatic agents    PRN Hosie Spangle   1 application at [id number]  . DISCONTD: HYDROmorphone (DILAUDID) injection 0.25-0.5 mg  0.25-0.5 mg Intravenous Q5 min PRN Warrick Parisian, MD   0.5 mg at 01/30/11 1942  . DISCONTD: ketorolac (TORADOL) 30 MG/ML injection 30 mg  30 mg Intravenous Once [employer]      . DISCONTD: lidocaine-EPINEPHrine (XYLOCAINE W/EPI) 1 %-1:100000 (with pres) injection    PRN Okey Regal Nudelman   20 mL at 01/30/11 1455  . DISCONTD: LORazepam (ATIVAN) 2 MG/ML injection           . DISCONTD: morphine 1 MG/ML PCA injection   Intravenous Q4H Hosie Spangle      . DISCONTD: naloxone [facility]) injection 0.4 mg  0.4 mg Intravenous PRN Hosie Spangle      . DISCONTD: ondansetron (ZOFRAN) injection 4 mg  4 mg Intravenous Q6H PRN Hosie Spangle      . DISCONTD: sodium chloride 0.9 % injection 3 mL  3 mL Intravenous Q12H Hosie Spangle      . DISCONTD: sodium chloride 0.9 % injection 3 mL  3 mL Intravenous PRN Hosie Spangle      . DISCONTD: sodium chloride 0.9 % injection 9 mL  9 mL Intravenous PRN Hosie Spangle      .  DISCONTD: sodium chloride irrigation 0.9 %    PRN Okey Regal Nudelman   1,000 mL at 01/30/11 1455  . DISCONTD: sodium chloride irrigation 0.9 %    PRN Okey Regal Nudelman   1,000 mL at 01/30/11 1739  . DISCONTD: thrombin spray    PRN Okey Regal Nudelman   20,000 Units at 01/30/11 1455  . DISCONTD: zolpidem (AMBIEN) tablet 5 mg  5 mg Oral QHS PRN Myriam Jacobson, PA       Medications Prior to Admission  Medication Sig Dispense Refill  . hydrOXYzine (ATARAX/VISTARIL) 25 MG tablet Take 25 mg by mouth every 6 (six) hours as needed.          Home: Home Living Lives With: Alone Receives Help From: Other (Comment) (Daughter and friends to come and stay.) Type of Home: House Home Layout: One level Home Access: Stairs to enter Entrance Stairs-Rails: Right Entrance Stairs-Number of  Steps: 2 Home Adaptive Equipment: None   Functional History: Prior Function Level of Independence: Independent with basic ADLs;Independent with homemaking with ambulation;Independent with gait;Independent with transfers Able to Take Stairs?: Yes Driving: Yes  Functional Status:  Mobility: Bed Mobility Bed Mobility: Yes Rolling Left: 4: Min assist Rolling Left Details (indicate cue type and reason): Assist to facilitate rotation of trunk with verbal cues for sequence. Left Sidelying to Sit: 4: Min assist;HOB flat;With rails Left Sidelying to Sit Details (indicate cue type and reason): Assist to translate trunk to midline with cues for sequence. Transfers Transfers: Yes Sit to Stand: 4: Min assist;From bed Sit to Stand Details (indicate cue type and reason): Assist to balance with cues for hand placement and safety. Stand to Sit: 4: Min assist;To chair/3-in-1 Stand to Sit Details: Assist to eccentric control of trunk with cues for safest hand placement. Ambulation/Gait Ambulation/Gait: Yes Ambulation/Gait Assistance: 3: Mod assist Ambulation/Gait Assistance Details (indicate cue type and reason): Assist for balance secondary to left lean with decreased strength of bilateral LEs (with bilateral knees often buckling especially with fatigue).  Cues to stay inside RW and extend trunk.  Scissoring at feet.  Cues for heel strike throughout distance. Ambulation Distance (Feet): 60 Feet Assistive device: Rolling walker Gait Pattern: Decreased step length - right;Decreased step length - left;Decreased dorsiflexion - right;Decreased dorsiflexion - left;Right steppage;Left steppage;Scissoring;Trunk flexed Stairs: No Wheelchair Mobility Wheelchair Mobility: No  ADL:    Cognition: Cognition Arousal/Alertness: Awake/alert Orientation Level: Oriented X4 Cognition Arousal/Alertness: Awake/alert Overall Cognitive Status: Appears within functional limits for tasks assessed Orientation Level:  Oriented X4   Blood pressure 90/54, pulse 106, temperature 98.1 F (36.7 C), temperature source Oral, resp. rate 18, height 5\' 4"  (1.626 m), weight 60.2 kg (132 lb 11.5 oz), SpO2 98.00%. Physical Exam  Constitutional: She is oriented to person, place, and time. She appears well-developed.  HENT:  Head: Normocephalic.  Neck: Normal range of motion. Neck supple.  Cardiovascular: Normal rate and rhythmn No murmur heard.  Abdomen soft, non tender with intact bowel sounds. Pulmonary/Chest: Effort normal. Chest clear,  Neurological: She is alert and oriented to person, place, and time. Left dorsiflexor is 1-2, right dorsiflexor is 0-1.Plantar flexion 4-5 bilat. Proximal legs are grossly 3-4/5 with pain inhibition.  Patient with diminished sensation over the medial leg, foot, and into the heel.  DTR's are 1+ at the knees, absent at the ankles. Cognitively she's alert and appropriate.  CN exam nl. Skin:  Back incision dressed/clean/dry.Dermabond in place without erythema.  Psychiatric: She has a normal mood and affect  Results for orders placed during the hospital encounter of 01/30/11 (from the past 48 hour(s))  POCT I-STAT 4, (NA,K, GLUC, HGB,HCT)     Status: Normal   Collection Time   01/30/11 12:16 PM      Component Value Range Comment   Sodium 138  135 - 145 (mEq/L)    Potassium 5.0  3.5 - 5.1 (mEq/L)    Glucose, Bld 77  70 - 99 (mg/dL)    HCT 40.0  36.0 - 46.0 (%)    Hemoglobin 13.6  12.0 - 15.0 (g/dL)   BASIC METABOLIC PANEL     Status: Abnormal   Collection Time   01/30/11  8:31 PM      Component Value Range Comment   Sodium 138  135 - 145 (mEq/L)    Potassium 5.9 (*) 3.5 - 5.1 (mEq/L)    Chloride 96  96 - 112 (mEq/L)    CO2 24  19 - 32 (mEq/L)    Glucose, Bld 111 (*) 70 - 99 (mg/dL)    BUN 42 (*) 6 - 23 (mg/dL)    Creatinine, Ser 7.74 (*) 0.50 - 1.10 (mg/dL)    Calcium 7.2 (*) 8.4 - 10.5 (mg/dL)    GFR calc non Af Amer 5 (*) >90 (mL/min)    GFR calc Af Amer 6 (*) >90  (mL/min)   CBC     Status: Abnormal   Collection Time   01/30/11  8:31 PM      Component Value Range Comment   WBC 12.2 (*) 4.0 - 10.5 (K/uL)    RBC 4.30  3.87 - 5.11 (MIL/uL)    Hemoglobin 12.5  12.0 - 15.0 (g/dL)    HCT 37.0  36.0 - 46.0 (%)    MCV 86.0  78.0 - 100.0 (fL)    MCH 29.1  26.0 - 34.0 (pg)    MCHC 33.8  30.0 - 36.0 (g/dL)    RDW 14.9  11.5 - 15.5 (%)    Platelets 186  150 - 400 (K/uL)   RENAL FUNCTION PANEL     Status: Abnormal   Collection Time   01/31/11  2:55 AM      Component Value Range Comment   Sodium 141  135 - 145 (mEq/L)    Potassium 4.1  3.5 - 5.1 (mEq/L) DELTA CHECK NOTED   Chloride 98  96 - 112 (mEq/L)    CO2 30  19 - 32 (mEq/L)    Glucose, Bld 104 (*) 70 - 99 (mg/dL)    BUN 15  6 - 23 (mg/dL) DELTA CHECK NOTED   Creatinine, Ser 3.94 (*) 0.50 - 1.10 (mg/dL) DELTA CHECK NOTED   Calcium 7.9 (*) 8.4 - 10.5 (mg/dL)    Phosphorus 2.9  2.3 - 4.6 (mg/dL)    Albumin 3.0 (*) 3.5 - 5.2 (g/dL)    GFR calc non Af Amer 11 (*) >90 (mL/min)    GFR calc Af Amer 13 (*) >90 (mL/min)   BASIC METABOLIC PANEL     Status: Abnormal   Collection Time   01/31/11  2:56 AM      Component Value Range Comment   Sodium 140  135 - 145 (mEq/L)    Potassium 4.1  3.5 - 5.1 (mEq/L) DELTA CHECK NOTED   Chloride 97  96 - 112 (mEq/L)    CO2 31  19 - 32 (mEq/L)    Glucose, Bld 104 (*) 70 - 99 (mg/dL)    BUN 15  6 - 23 (mg/dL)  DELTA CHECK NOTED   Creatinine, Ser 3.93 (*) 0.50 - 1.10 (mg/dL) DELTA CHECK NOTED   Calcium 8.0 (*) 8.4 - 10.5 (mg/dL)    GFR calc non Af Amer 11 (*) >90 (mL/min)    GFR calc Af Amer 13 (*) >90 (mL/min)   CBC     Status: Abnormal   Collection Time   01/31/11  2:56 AM      Component Value Range Comment   WBC 9.4  4.0 - 10.5 (K/uL)    RBC 3.50 (*) 3.87 - 5.11 (MIL/uL)    Hemoglobin 10.6 (*) 12.0 - 15.0 (g/dL)    HCT 30.4 (*) 36.0 - 46.0 (%)    MCV 86.9  78.0 - 100.0 (fL)    MCH 30.3  26.0 - 34.0 (pg)    MCHC 34.9  30.0 - 36.0 (g/dL)    RDW 14.9  11.5 -  15.5 (%)    Platelets 134 (*) 150 - 400 (K/uL) DELTA CHECK NOTED   Dg Lumbar Spine 2-3 Views  01/30/2011  *RADIOLOGY REPORT*  Clinical Data: 60 year old female undergoing lumbar spine surgery.  LUMBAR SPINE - 2-3 VIEW  Comparison: Intraoperative radiographs from 1450 hours the same day and earlier.  Fluoroscopy time of 0.5 minutes was utilized.  Findings: Intraoperative frontal and lateral fluoroscopic views of the lower lumbar spine demonstrate bilateral transpedicular hardware placement at L4, L5, and S1.  Interbody implant at L4-L5 and L5-S1.  Anterolisthesis of L4 on L5 re-identified.  IMPRESSION: Posterior and interbody fusion depicted at L4-L5 and L5-S1.  Original Report Authenticated By: Randall An, M.D.   Dg Lumbar Spine 2-3 Views  01/30/2011  *RADIOLOGY REPORT*  Clinical Data: For S1 PLIF  LUMBAR SPINE - 2-3 VIEW  Comparison: 11/07/2010  Findings: Initial film shows a needle at the level of the spinous process of L4.  Second film shows tissue spreaders posteriorly with probes at the L4 and L5 laminar levels.  IMPRESSION: L4 and L5 laminar levels localized.  Original Report Authenticated By: Jules Schick, M.D.   Ct Lumbar Spine Wo Contrast  02/01/2011  *RADIOLOGY REPORT*  Clinical Data: Status post lumbar spinal surgery on 01/30/2011; now cannot lift legs, though was able to ambulate earlier in the day. Evaluate for hematoma.  CT LUMBAR SPINE WITHOUT CONTRAST  Technique:  Multidetector CT imaging of the lumbar spine was performed without intravenous contrast administration. Multiplanar CT image reconstructions were also generated.  Comparison: MRI of the lumbar spine performed 11/07/2010.  Findings: No definite significant focal hematoma is characterized. Scattered foci of air are seen along the lumbar spinal canal, suggesting against significant compression.  At the level of the decompression, significant soft tissue edema is seen; there is no definite evidence of compression of the  exiting nerve roots at L4- L5 or L5-S1, though a broad-based disc protrusion is again noted at L4-L5, with persistent grade 1 anterolisthesis at this level and effacement of the foraminal fat.  Soft tissue air is noted tracking along the paraspinal musculature, with underlying edema.  Inflammation and edema are noted within the superficial soft tissues along the back.  Evaluation of the spinal canal at the level of surgery is suboptimal due to metal artifact. The lumbar spinal fusion rods at L4-S1, with associated interbody spacers, are grossly unremarkable in appearance.  There is diffuse calcification along the abdominal aorta and its branches.  The patient appears status post right-sided nephrectomy; numerous large cysts are noted arising at the left kidney.  Mild scattered diverticulosis is  noted along the sigmoid colon.  IMPRESSION:  1.  No definite significant focal hematoma seen. 2.  Lumbar spinal fusion is grossly unremarkable in appearance. 3.  Soft tissue edema noted at the level of decompression; no definite evidence of impression on the exiting nerve roots at L4-L5 or L5-S1, though there is persistent grade 1 anterolisthesis at L4- L5 with a broad-based disc protrusion causing effacement of foraminal fat bilaterally. 4.  Scattered air tracking along the paraspinal musculature and along the lumbar spinal canal. 5.  Diffuse calcification along the abdominal aorta and branches. 6.  Status post right-sided nephrectomy; numerous large cysts arising at the left kidney. 7.  Mild scattered diverticulosis along the sigmoid colon.  Original Report Authenticated By: Santa Lighter, M.D.   Dg C-arm 1-60 Min  01/30/2011  CLINICAL DATA: L4-5, L5-S1 PLIF   C-ARM 1-60 MINUTES  Fluoroscopy was utilized by the requesting physician.  No radiographic  interpretation.      Post Admission Physician Evaluation: 1. Functional deficits secondary  to lumbar stenosis and radiculopathy s/p decompression and fusion with  post-operative foot drop. 2. Patient is admitted to receive collaborative, interdisciplinary care between the physiatrist, rehab nursing staff, and therapy team. 3. Patient's level of medical complexity and substantial therapy needs in context of that medical necessity cannot be provided at a lesser intensity of care such as a SNF. 4. Patient has experienced substantial functional loss from his/her baseline which was documented above under the "Functional History" and "Functional Status" headings.  Judging by the patient's diagnosis, physical exam, and functional history, the patient has potential for functional progress which will result in measurable gains while on inpatient rehab.  These gains will be of substantial and practical use upon discharge  in facilitating mobility and self-care at the household level. 5. Physiatrist will provide 24 hour management of medical needs as well as oversight of the therapy plan/treatment and provide guidance as appropriate regarding the interaction of the two. 6. 24 hour rehab nursing will assist with bladder management, bowel management, safety, skin/wound care, medication administration, pain management and patient education  and help integrate therapy concepts, techniques,education, etc. 7. PT will assess and treat for: balance, endurance, locomotion, strength, transferring and adaptive equipment and orthotic training. Goals are: independent with assistive device. 8. OT will assess and treat for: bathing, bowel/bladder control, locomotion, toileting and transferring .  Goals are: independent with assistive device.  9. SLP will assess and treat for: not applicable.  Goals are: N/A. 10. Case Management and Social Worker will assess and treat for psychological issues and discharge planning. 11. Team conference will be held weekly to assess progress toward goals and to determine barriers to discharge. 12.  Patient will receive at least 3 hours of therapy per day at  least 5 days per week. 13. ELOS and Prognosis: 2 weeks excellent   Medical Problem List and Plan: 1.Lumbar stenosis with radiculopathy.Lumbar 4-5 and L5-S1 Laminectomy and fusion 11/14 2. DVT Prophylaxis/Anticoagulation: SCD'S, consider sq lovenox 3. Pain Management: Norco,robaxin.Monitor with activity.  Appears to be comfortable at rest, but having some problems with activity. Consider scheduling medications prior to rehab sessions.  4.ESRD-dialysis per [facility].Weigh daily.Follow up labs.Strict i/o;s. Schedule HD post therapy day so as not to interfere with schedule and patient progress. 5.HTN-lopressor.Monitor with increased activity.  She is normotensive today. 6. Mood:Ego support.  She is in good spirits at present. 7.Acute on chronic anemia:  hgb stable.  Intervention per [facility].     02/01/2011, 9:45 AM

## 2011-02-01 NOTE — Plan of Care (Signed)
Problem: Consults Goal: RH SPINAL CORD INJURY PATIENT EDUCATION See Patient Education module for education specifics.  Outcome: Progressing Hand out given to patient on admission to 4000 Goal: Skin Care Protocol Initiated - if indicated If consults are not indicated, leave blank or document N/A Outcome: Progressing Initiated skin care protocol on admission to 4000  Problem: RH SKIN INTEGRITY Goal: RH STG SKIN FREE OF INFECTION/BREAKDOWN Outcome: Progressing No signs/symptoms of skin infection  Problem: RH PAIN MANAGEMENT Goal: RH STG PAIN MANAGED AT OR BELOW PT'S PAIN GOAL Outcome: Progressing Pain goal of 4/10

## 2011-02-01 NOTE — Discharge Summary (Signed)
Physician Discharge Summary  Patient ID: [name] MRN: [id number] DOB/AGE: September 30, 1950 60 y.o.  Admit date: 01/30/2011 Discharge date: 02/01/2011  Admission Diagnoses:  Lumbar stenosis dynamic lumbar spondylolisthesis lumbar spondylosis lumbar radiculopathy with foot drop.   Discharge Diagnoses:   Lumbar stenosis dynamic lumbar spondylolisthesis lumbar spondylosis lumbar radiculopathy with foot drop.      Principal Problem:  *Lumbar stenosis with neurogenic claudication Active Problems:  Spondylolisthesis of lumbar region  End stage renal disease on dialysis  Hypertension   Discharged Condition: good  Hospital Course: Patient was admitted and underwent an L4-S1 lumbar laminectomy facetectomy and foraminotomies for decompression a bilateral L4-5 and L5-S1 posterior lumbar interbody arthrodesis and bilateral L4-S1 posterior lateral arthrodesis. She did well through surgery. She was seen in nephrology consultation by Dr. Jamal Maes and followed up subsequently by Dr. Donato Heinz. Patient underwent hemodialysis on the night of surgery. Electrolytes and blood count the following day looked good.  Patient did complain of numbness in the distal lower shoelace and then exam was found to have increased weakness of dorsiflexion bilaterally. She was evaluated with CT scan that showed slight medial positioning of the right S1 screw but was otherwise unremarkable, and there did not appear to be any evidence of hematoma.  Patient overall was comfortable and was able to begin to mobilize with PT. However because of her weakness it was felt best to consult physical medicine and rehabilitation regarding comprehensive inpatient rehabilitation. The patient was seen by Dr. Tawni Levy and accepted for rehabilitation and is now transferred to the [facility] inpatient rehabilitation unit.  Her wound is healing well the Dermabond is intact there is no erythema swelling or  drainage.   Consults: nephrology  Treatments: dialysis: Hemodialysis  Discharge Exam: Blood pressure 90/58, pulse 109, temperature 98.6 F (37 C), temperature source Oral, resp. rate 16, height 5\' 4"  (1.626 m), weight 60.2 kg (132 lb 11.5 oz), SpO2 96.00%.   Disposition: [facility] inpatient rehabilitation.   Discharge Medication List as of 02/01/2011  3:15 PM    CONTINUE these medications which have NOT CHANGED   Details  calcium carbonate (TUMS - DOSED IN MG ELEMENTAL CALCIUM) 500 MG chewable tablet Chew 1-2 tablets by mouth 3 (three) times daily.  , Until Discontinued, Historical Med    hydrOXYzine (ATARAX/VISTARIL) 25 MG tablet Take 25 mg by mouth every 6 (six) hours as needed. For itching, Until Discontinued, Historical Med      STOP taking these medications     naproxen sodium (ANAPROX) 220 MG tablet          Signed: Hosie Spangle, MD 02/01/2011, 5:47 PM

## 2011-02-01 NOTE — Progress Notes (Signed)
I met with patient and her daughter, Mateo Flow, at bedside. Patient will benefit from an inpatient acute rehabilitation stay prior to discharge home. Patient in agreement. We can admit patient today. I contacted Dr. Rita Ohara by phone and he is in agreement and will discharge pt to CIR today. Please call with any questions. Pager [phone].

## 2011-02-01 NOTE — Progress Notes (Signed)
Utilization review completed. Rozanna Boer, RN, BSN. 02/01/11

## 2011-02-01 NOTE — Progress Notes (Signed)
Overall Plan of Care [facility]) Patient Details Name: RECHELLE SCHULER MRN: [id number] DOB: 1950/05/15  Diagnosis:  Lumbar stenosis and radiculopathy   Primary Diagnosis:    Radiculopathy Co-morbidities: ESRD, Hemo Dialysis, Seizure, Arthritis, Anemia, HTN, Hep C, Right Kidney removal, Brain Aneurysm  Functional Problem List  Patient demonstrates impairments in the following areas: Balance, Endurance, Motor, Pain and Safety  Basic ADL's: grooming, bathing and dressing Advanced ADL's: full meal preparation, laundry and light housekeeping  Transfers:  bed mobility, bed to chair, toilet, tub/shower, car and furniture Locomotion:  ambulation, wheelchair mobility and stairs  Additional Impairments:  Other  Anticipated Outcomes Item Anticipated Outcome  Eating/Swallowing    Basic self-care  Mod I  Tolieting  modi  Bowel/Bladder  Continent of Bowels, Patient is Anuric  Transfers  Mod I  Locomotion  Mod I w/c, Supervision gait  Communication    Cognition    Pain  Pt level 4/10 or less or at patients level for comfort  Safety/Judgment  No injury's from falls  Other     Therapy Plan:         Team Interventions: Item RN PT OT SLP SW TR Other  Self Care/Advanced ADL Retraining   x      Neuromuscular Re-Education   x      Therapeutic Activities  x x      UE/LE Strength Training/ROM  x x      UE/LE Coordination Activities  x x      Visual/Perceptual Remediation/Compensation         DME/Adaptive Equipment Instruction  x x      Therapeutic Exercise  x x      Balance/Vestibular Training  x x      Patient/Family Education  x x      Cognitive Remediation/Compensation         Functional Mobility Training  x x      Ambulation/Gait Training  x       IT trainer  x       Wheelchair Propulsion/Positioning  x       Writer          Bladder Management NA        Bowel Management x        Disease Management/Prevention         Pain Management x x       Medication Management x        Skin Care/Wound Management x        Splinting/Orthotics         Discharge Planning     x    Psychosocial Support     x                       Team Discharge Planning: Destination:  Home Projected Follow-up:  PT, Home Health and Outpatient; Follow OT Projected Equipment Needs:  Walker and Wheelchair, tub bench and ? 3n1 commode Patient/family involved in discharge planning:  Yes  MD ELOS: 2 week Medical Rehab Prognosis:  Excellent Assessment: Patient is admitted for comprehensive inpatient rehabilitation therapies. PT will assess and treat the patient for lower ext strength, adaptive equipment training, orthotic assessment and use safety pain management with modified independent goals. OT will assess and treat patient for hypertrophy strength everyday  living activities pain management's brace wear safety adaptive equipment training. Both PT and OT will see the patient least one to 2 hours per day at least 5 days per week. Goals overall will be modified independent. Patient patient is on dialysis so there will be no bladder training goals however we will seek independent with bowel management. Patient is quite motivated. Goals will be partially at a wheelchair level.

## 2011-02-01 NOTE — Progress Notes (Signed)
  I was prepping a patient for craniotomy for acute subdural at around 2:30 this morning when I was called about [name]. I stopped what I was doing and went to her room to examine her. She complained of numbness in her feet and inability to dorsiflex her feet. She felt like it was somewhat worse than it was earlier in the day. She had good plantar flexion but weak dorsi flexion, and had some weakness in lifting her legs off the bed, but with her legs lifted with my hands under her knees she appeared to have very good quadriceps strength. She denied any pain in the legs. I was concerned about the bilateral foot drops and ordered a stat CT scan of the lumbar spine, and once I finished the craniotomy I was doing I was able to review the CT scan as well as its report. The radiologist is reporting no focal hematoma, but I think this is a very hard call given the fact that this is CT scan and there is artifact from the hardware. The right S1 pedicle screw was medially placed but I'm not sure this would be the cause of bilateral foot drops and she's having no S1 radicular pain. L5 screws are somewhat medial but don't look like they would be compressive. I still have concerns that she has a postoperative hematoma. There was no postoperative Hemovac drain placed at the time of surgery. It is now 4:30 in the morning and the patient is not in extremis any neurologic deficit does not appear to be rapidly progressive, and Dr. Sherwood Gambler should be in to work in the next 3 hours. I do not believe appears the patient at risk of harm or injury to wait that long to discuss the case with Dr. Sherwood Gambler to determine whether or not he feels she should go back for exploration.

## 2011-02-01 NOTE — PMR Pre-admission (Signed)
PMR Admission Coordinator Pre-Admission Assessment  Patient:  [name] is an 60 y.o., female MRN:  [id number] DOB:  Mar 03, 1951 Height:  Height: 5\' 4"  (162.6 cm) Weight:  Weight: 60.2 kg (132 lb 11.5 oz)  SS# is [id number]  Dialysis Tuesday, Thursday, and Saturday. Drives self. On dialysis since early 90s.  Insurance Information:  [place] a and b      [id number] a      Subscriber:pt  Benefits:  Phone #:[email or web address]     Name:automated Eff. Date:a 10/16/89 and b 12/17/91     Deduct:$1156      Out of Pocket [id number]      Life [id number] CIR:100%      SNF:20 full days LBD 4/3 10 Outpatient:80%     Co-Pay:20% Home Health:100%      Co-Pay:o DME:80%     Co-Pay:20% Providers:pt choice  SECONDARY:BCBS      Policy#:[id number]      Subscriber:pt No auth required with medicare primary  Current Medical History:   Patient Admitting Diagnosis:  Lumbar stenosis and radiculopathies  s/p decompression and fusion History of Present Illness: Pt admitted 01/30/11 with increased low back pain since 8/12 and progressive right foot drop. Pt underwent lumbar L4 and S1 lumbar laminectomy fasciectomy and foraminotomies with decompression as well as lumbar 4 and 5 and L5 and S1 posterior lumbar interbody arthrodesis on 11.14. 11/15 noted increased foot drop with follow up MRI showed no obvious hematoma.   Patients Past Medical History:   Past Medical History  Diagnosis Date  . Seizures     r/t HTN in 1990's x 1  . Brain aneurysm   . Blood transfusion 1990's    r/t Kidney removal surgery  . Arthritis     Back  . Anemia   . Umbilical hernia age 58  . Heart murmur     Born with heart mumur, does not require follow up per pt  . Hypertension     Does not see a heart doctor, had pre transplant stress test at [facility]    . Diabetes mellitus     Borderline  . End stage renal disease on dialysis     T/Th/Sat dialysis on [employer]  . End stage renal disease on dialysis    01-29-2011  . Hepatitis C    Family Medical History:  family history is negative for Anesthesia problems.   Height and Weight Height: 5\' 4"  (162.6 cm) Weight: 60.2 kg (132 lb 11.5 oz) Type of Weight: Post-Dialysis Estimated Dry Weight: 53 kg (116 lb 13.5 oz) BSA (Calculated - sq m): 1.65 sq meters BMI (Calculated): 22.8  Weight in (lb) to have BMI = 25: 145.3   Prior Rehab/Hospitalizations: outpt therapy only  Medications PTA Medications:   Medications Prior to Admission  Medication Dose Route Frequency Provider Last Rate Last Dose  . 0.9 %  sodium chloride infusion  100 mL Intravenous PRN Myriam Jacobson, PA      . 0.9 %  sodium chloride infusion  100 mL Intravenous PRN Myriam Jacobson, PA      . acetaminophen (TYLENOL) tablet 650 mg  650 mg Oral Q4H PRN Hosie Spangle       Or  . acetaminophen (TYLENOL) suppository 650 mg  650 mg Rectal Q4H PRN Hosie Spangle      . alteplase (CATHFLO ACTIVASE) injection 2 mg  2 mg Intracatheter Once PRN Myriam Jacobson, PA      . alum & mag hydroxide-simeth ([place]) [id number]  MG/5ML suspension 30 mL  30 mL Oral Q6H PRN Hosie Spangle      . calcium carbonate (dosed in mg elemental calcium) suspension 500 mg of elemental calcium  500 mg of elemental calcium Oral Q6H PRN Myriam Jacobson, PA      . calcium carbonate (TUMS - dosed in mg elemental calcium) chewable tablet 200-400 mg of elemental calcium  1-2 tablet Oral TID Okey Regal Nudelman   400 mg of elemental calcium at 02/01/11 0941  . camphor-menthol (SARNA) lotion 1 application  1 application Topical [id number] PRN Myriam Jacobson, PA       And  . hydrOXYzine (ATARAX/VISTARIL) tablet 25 mg  25 mg Oral Q8H PRN Myriam Jacobson, PA      . ceFAZolin (ANCEF) IVPB 1 g/50 mL premix  1 g Intravenous 60 min Pre-Op Okey Regal Nudelman   1 g at 01/30/11 1815  . cyclobenzaprine (FLEXERIL) tablet 10 mg  10 mg Oral TID PRN Okey Regal Nudelman   10 mg at 01/31/11 0504  . docusate sodium  (COLACE) capsule 100 mg  100 mg Oral BID Okey Regal Nudelman   100 mg at 02/01/11 0941  . docusate sodium (ENEMEEZ) enema 283 mg  1 enema Rectal PRN Myriam Jacobson, PA      . feeding supplement (NEPRO CARB STEADY) liquid 237 mL  237 mL Oral TID PRN Myriam Jacobson, PA      . heparin injection 1,000 Units  1,000 Units Dialysis PRN Myriam Jacobson, PA      . HYDROcodone-acetaminophen (NORCO) 5-325 MG per tablet 1-2 tablet  1-2 tablet Oral Q4H PRN Hosie Spangle   2 tablet at 01/31/11 1636  . hydrOXYzine (ATARAX/VISTARIL) tablet 50 mg  50 mg Oral Q3H PRN Hosie Spangle       Or  . hydrOXYzine (VISTARIL) injection 50 mg  50 mg Intramuscular Q3H PRN Hosie Spangle      . ketorolac (TORADOL) 30 MG/ML injection 30 mg  30 mg Intravenous Q6H Robert W Nudelman   30 mg at 02/01/11 0739  . lidocaine (XYLOCAINE) 1 % injection 5 mL  5 mL Intradermal PRN Myriam Jacobson, PA      . lidocaine-prilocaine (EMLA) cream 1 application  1 application Topical PRN Myriam Jacobson, PA      . LORazepam (ATIVAN) injection 0.5 mg  0.5 mg Intravenous Once Lucrezia Starch, MD   0.5 mg at 01/31/11 0015  . menthol-cetylpyridinium (CEPACOL) lozenge 3 mg  1 lozenge Oral PRN Hosie Spangle       Or  . phenol (CHLORASEPTIC) mouth spray 1 spray  1 spray Mouth/Throat PRN Hosie Spangle      . metoprolol tartrate (LOPRESSOR) tablet 25 mg  25 mg Oral BID Hosie Spangle      . morphine 4 MG/ML injection 4-8 mg  4-8 mg Intramuscular Q3H PRN Okey Regal Nudelman   4 mg at 02/01/11 0217  . ondansetron (ZOFRAN) injection 4 mg  4 mg Intravenous Once PRN Warrick Parisian, MD      . oxyCODONE-acetaminophen [facility]) 5-325 MG per tablet 1-2 tablet  1-2 tablet Oral Q4H PRN Hosie Spangle   1 tablet at 02/01/11 0908  . pentafluoroprop-tetrafluoroeth (GEBAUERS) aerosol 1 application  1 application Topical PRN Myriam Jacobson, PA      . promethazine (PHENERGAN) tablet 12.5 mg  12.5 mg Oral Q6H PRN Myriam Jacobson, PA  Or  . promethazine (PHENERGAN) suppository 25 mg  25 mg Rectal BID PRN Myriam Jacobson, PA      . sorbitol 70 % solution 30 mL  30 mL Oral PRN Myriam Jacobson, PA      . white petrolatum (VASELINE) gel           . zolpidem (AMBIEN) tablet 5 mg  5 mg Oral QHS PRN Hosie Spangle      . DISCONTD: 0.45 % sodium chloride infusion   Intravenous Continuous Okey Regal Nudelman 30 mL/hr at 01/31/11 0600    . DISCONTD: 0.9 %  sodium chloride infusion  250 mL Intravenous Continuous Hosie Spangle      . DISCONTD: 50,000 units bacitracin in 0.9% normal saline 250 mL irrigation    PRN Hosie Spangle      . DISCONTD: acetaminophen (TYLENOL) suppository 650 mg  650 mg Rectal Q6H PRN Myriam Jacobson, PA      . DISCONTD: acetaminophen (TYLENOL) tablet 650 mg  650 mg Oral Q6H PRN Myriam Jacobson, PA      . DISCONTD: bupivacaine (MARCAINE) 0.5 % injection    PRN Okey Regal Nudelman   20 mL at 01/30/11 1455  . DISCONTD: diphenhydrAMINE (BENADRYL) 12.5 MG/5ML elixir 12.5 mg  12.5 mg Oral Q6H PRN Hosie Spangle      . DISCONTD: diphenhydrAMINE (BENADRYL) injection 12.5 mg  12.5 mg Intravenous Q6H PRN Hosie Spangle      . DISCONTD: hemostatic agents    PRN Hosie Spangle   1 application at [id number]  . DISCONTD: HYDROmorphone (DILAUDID) injection 0.25-0.5 mg  0.25-0.5 mg Intravenous Q5 min PRN Warrick Parisian, MD   0.5 mg at 01/30/11 1942  . DISCONTD: ketorolac (TORADOL) 30 MG/ML injection 30 mg  30 mg Intravenous Once [employer]      . DISCONTD: lidocaine-EPINEPHrine (XYLOCAINE W/EPI) 1 %-1:100000 (with pres) injection    PRN Okey Regal Nudelman   20 mL at 01/30/11 1455  . DISCONTD: LORazepam (ATIVAN) 2 MG/ML injection           . DISCONTD: morphine 1 MG/ML PCA injection   Intravenous Q4H Hosie Spangle      . DISCONTD: naloxone [facility]) injection 0.4 mg  0.4 mg Intravenous PRN Hosie Spangle      . DISCONTD: ondansetron (ZOFRAN) injection 4 mg  4 mg  Intravenous Q6H PRN Hosie Spangle      . DISCONTD: sodium chloride 0.9 % injection 3 mL  3 mL Intravenous Q12H Hosie Spangle      . DISCONTD: sodium chloride 0.9 % injection 3 mL  3 mL Intravenous PRN Hosie Spangle      . DISCONTD: sodium chloride 0.9 % injection 9 mL  9 mL Intravenous PRN Hosie Spangle      . DISCONTD: sodium chloride irrigation 0.9 %    PRN Okey Regal Nudelman   1,000 mL at 01/30/11 1455  . DISCONTD: sodium chloride irrigation 0.9 %    PRN Okey Regal Nudelman   1,000 mL at 01/30/11 1739  . DISCONTD: thrombin spray    PRN Okey Regal Nudelman   20,000 Units at 01/30/11 1455  . DISCONTD: zolpidem (AMBIEN) tablet 5 mg  5 mg Oral QHS PRN Myriam Jacobson, PA       Medications Prior to Admission  Medication Sig Dispense Refill  . hydrOXYzine (ATARAX/VISTARIL) 25 MG tablet Take 25 mg by mouth every  6 (six) hours as needed.         Current Medications: Current facility-administered medications:0.9 %  sodium chloride infusion, 100 mL, Intravenous, PRN, Myriam Jacobson, PA;  0.9 %  sodium chloride infusion, 100 mL, Intravenous, PRN, Myriam Jacobson, PA;  acetaminophen (TYLENOL) suppository 650 mg, 650 mg, Rectal, Q4H PRN, Hosie Spangle;  acetaminophen (TYLENOL) tablet 650 mg, 650 mg, Oral, Q4H PRN, Okey Regal Nudelman alum & mag hydroxide-simeth (MAALOX PLUS) 400-400-40 MG/5ML suspension 30 mL, 30 mL, Oral, Q6H PRN, Hosie Spangle;  calcium carbonate (dosed in mg elemental calcium) suspension 500 mg of elemental calcium, 500 mg of elemental calcium, Oral, Q6H PRN, Myriam Jacobson, PA calcium carbonate (TUMS - dosed in mg elemental calcium) chewable tablet 200-400 mg of elemental calcium, 1-2 tablet, Oral, TID, Hosie Spangle, 400 mg of elemental calcium at 02/01/11 0941;  camphor-menthol (SARNA) lotion 1 application, 1 application, Topical, [id number] PRN, Myriam Jacobson, PA;  cyclobenzaprine (FLEXERIL) tablet 10 mg, 10 mg, Oral, TID PRN, Hosie Spangle, 10 mg at  01/31/11 0504 docusate sodium (COLACE) capsule 100 mg, 100 mg, Oral, BID, Hosie Spangle, 100 mg at 02/01/11 0941;  docusate sodium (ENEMEEZ) enema 283 mg, 1 enema, Rectal, PRN, Myriam Jacobson, PA;  feeding supplement (NEPRO CARB STEADY) liquid 237 mL, 237 mL, Oral, TID PRN, Myriam Jacobson, PA;  heparin injection 1,000 Units, 1,000 Units, Dialysis, PRN, Myriam Jacobson, PA HYDROcodone-acetaminophen (NORCO) 5-325 MG per tablet 1-2 tablet, 1-2 tablet, Oral, Q4H PRN, Hosie Spangle, 2 tablet at 01/31/11 1636;  hydrOXYzine (ATARAX/VISTARIL) tablet 25 mg, 25 mg, Oral, Q8H PRN, Myriam Jacobson, PA;  hydrOXYzine (ATARAX/VISTARIL) tablet 50 mg, 50 mg, Oral, Q3H PRN, Hosie Spangle;  hydrOXYzine (VISTARIL) injection 50 mg, 50 mg, Intramuscular, Q3H PRN, Okey Regal Nudelman ketorolac (TORADOL) 30 MG/ML injection 30 mg, 30 mg, Intravenous, Q6H, Hosie Spangle, 30 mg at 02/01/11 0739;  lidocaine (XYLOCAINE) 1 % injection 5 mL, 5 mL, Intradermal, PRN, Myriam Jacobson, PA;  lidocaine-prilocaine (EMLA) cream 1 application, 1 application, Topical, PRN, Myriam Jacobson, PA;  menthol-cetylpyridinium (CEPACOL) lozenge 3 mg, 1 lozenge, Oral, PRN, Hosie Spangle metoprolol tartrate (LOPRESSOR) tablet 25 mg, 25 mg, Oral, BID, Hosie Spangle;  morphine 4 MG/ML injection 4-8 mg, 4-8 mg, Intramuscular, Q3H PRN, Hosie Spangle, 4 mg at 02/01/11 0217;  oxyCODONE-acetaminophen (PERCOCET) 5-325 MG per tablet 1-2 tablet, 1-2 tablet, Oral, Q4H PRN, Hosie Spangle, 1 tablet at 02/01/11 0908;  pentafluoroprop-tetrafluoroeth (GEBAUERS) aerosol 1 application, 1 application, Topical, PRN, Myriam Jacobson, PA phenol (CHLORASEPTIC) mouth spray 1 spray, 1 spray, Mouth/Throat, PRN, Hosie Spangle;  promethazine (PHENERGAN) suppository 25 mg, 25 mg, Rectal, BID PRN, Myriam Jacobson, PA;  promethazine (PHENERGAN) tablet 12.5 mg, 12.5 mg, Oral, Q6H PRN, Myriam Jacobson, PA;  sorbitol 70 % solution 30 mL, 30  mL, Oral, PRN, Myriam Jacobson, PA;  white petrolatum (VASELINE) gel, , , ,  zolpidem (AMBIEN) tablet 5 mg, 5 mg, Oral, QHS PRN, Hosie Spangle;  DISCONTD: 0.45 % sodium chloride infusion, , Intravenous, Continuous, Hosie Spangle, Last Rate: 30 mL/hr at 01/31/11 0600  Precautions/Special Needs:    Additional Precautions/Restrictions: Precautions Precautions: Back Precaution Booklet Issued: Yes (comment) (Back handout given.) Required Braces or Orthoses: Yes Spinal Brace: Lumbar corset;Applied in standing position;Other (comment) (Don in standing per Dr. Sherwood Gambler.) Restrictions Weight Bearing Restrictions: No  Therapy Assessments Current: Cognition Arousal/Alertness: Awake/alert Overall Cognitive Status: Appears within  functional limits for tasks assessed Orientation Level: Oriented X4 Home Living Lives With: Alone Receives Help From: Other (Comment) (Daughter and friends to come and stay.) Type of Home: House Home Layout: One level Home Access: Stairs to enter Entrance Stairs-Rails: Right Entrance Stairs-Number of Steps: 2 Home Adaptive Equipment: None Sensation Light Touch: Impaired Detail (Bilateral feet numb.) Light Touch Impaired Details: Absent RLE;Absent LLE (In feet only.) Stereognosis: Not tested Hot/Cold: Not tested Proprioception: Not tested Cognition Arousal/Alertness: Awake/alert Overall Cognitive Status: Appears within functional limits for tasks assessed Orientation Level: Oriented X4 Coordination Gross Motor Movements are Fluid and Coordinated: Yes Fine Motor Movements are Fluid and Coordinated: Not tested  Prior Function: Prior Function Level of Independence: Independent with basic ADLs;Independent with homemaking with ambulation;Independent with gait;Independent with transfers Able to Take Stairs?: Yes Driving: Yes  ADLs/Mobility:Current 01/31/11    Bed Mobility Bed Mobility: Yes Rolling Left: 4: Min assist Rolling Left Details (indicate  cue type and reason): Assist to facilitate rotation of trunk with verbal cues for sequence. Left Sidelying to Sit: 4: Min assist;HOB flat;With rails Left Sidelying to Sit Details (indicate cue type and reason): Assist to translate trunk to midline with cues for sequence. Transfers Transfers: Yes Sit to Stand: 4: Min assist;From bed Sit to Stand Details (indicate cue type and reason): Assist to balance with cues for hand placement and safety. Stand to Sit: 4: Min assist;To chair/3-in-1 Stand to Sit Details: Assist to eccentric control of trunk with cues for safest hand placement. Ambulation/Gait Ambulation/Gait: Yes Ambulation/Gait Assistance: 3: Mod assist Ambulation/Gait Assistance Details (indicate cue type and reason): Assist for balance secondary to left lean with decreased strength of bilateral LEs (with bilateral knees often buckling especially with fatigue).  Cues to stay inside RW and extend trunk.  Scissoring at feet.  Cues for heel strike throughout distance. Ambulation Distance (Feet): 60 Feet Assistive device: Rolling walker Gait Pattern: Decreased step length - right;Decreased step length - left;Decreased dorsiflexion - right;Decreased dorsiflexion - left;Right steppage;Left steppage;Scissoring;Trunk flexed Stairs: No Wheelchair Mobility Wheelchair Mobility: No Posture/Postural Control Posture/Postural Control: No significant limitations Balance Balance Assessed: No  Home Assistive Devices/Equipment:  Home Assistive Devices/Equipment Home Assistive Devices/EquipmentResearch scientist (physical sciences)  Discharge Planning:  Discharge Planning Living Arrangements: Alone Support Systems: Friends/neighbors Do you have any problems obtaining your medications?: No Type of Residence: Private residence [place]: No Expected Discharge Date:  (ELOS 1 week) Case Management Consult Needed: Yes (Comment) (Case Manager ordered rolling walker and 3in1)   Previous Home Environment:  Previous Home  Environment Living Arrangements: Alone Support Systems: Friends/neighbors Do you have any problems obtaining your medications?: No Type of Residence: Private residence [place]: No Expected Discharge Date:  (ELOS 1 week)  Discharge Living Setting:    Social/Family/Support Systems:  Social/Family/Support Systems Anticipated Caregiver: Daughter and friend Anticipated Caregiver's Contact Information: Amarylis Donan, dtr is [id number]; friend is Ilene Qua at [phone] Ability/Limitations of Caregiver: supervision Caregiver Availability: 24/7 Discharge Plan Discussed with Primary Caregiver: Yes Is Caregiver In Agreement with Plan?: Yes Does Caregiver/Family have Issues with Lodging/Transportation while Pt is in Rehab?: No   Goals/Additional Needs:  Goals/Additional Needs Patient/Family Goal for Rehab: mod I P.T. and O.T. Special Service Needs: outpt dialysis t,th,sat, drives self. Hassan Rowan can transport after d/c Pt/Family Agrees to Admission and willing to participate: Yes Program Orientation Provided & Reviewed with Pt/Caregiver Including Roles  & Responsibilities: Yes  Preadmission Screen Completed By:  Cleatrice Burke, 02/01/2011 10:01 AM  Patient's condition:  This patient's condition remains  as documented in the Consult dated 02/01/11, in which the Rehabilitation Physician determined and documented that the patient's condition is appropriate for intensive rehabilitative care in an inpatient rehabilitation facility.  Preadmission Screen Competed by: Danne Baxter, RN, [id number] on 02/01/11.  Discussed status with Dr. Naaman Plummer on 02/01/11 at 66 and received telephone approval for admission today.  Admission Coordinator:  Cleatrice Burke, time [id number] /Date 02/01/11.  Marland Kitchen

## 2011-02-01 NOTE — Progress Notes (Signed)
Subjective: Interval History: has complaints back and leg pain.  Objective: Vital signs in last 24 hours: Temp:  [97.8 F (36.6 C)-98.6 F (37 C)] 98.6 F (37 C) (11/16 1009) Pulse Rate:  [90-109] 109  (11/16 1009) Resp:  [16-18] 16  (11/16 1009) BP: (78-103)/(49-67) 90/58 mmHg (11/16 1030) SpO2:  [95 %-99 %] 96 % (11/16 1009) Weight change:   Intake/Output from previous day:   Intake/Output this shift:    General appearance: cooperative and no distress Resp: clear to auscultation bilaterally Cardio: systolic murmur: early systolic 2/6, blowing at 2nd left intercostal space GI: soft, non-tender; bowel sounds normal; no masses,  no organomegaly Extremities: extremities normal, atraumatic, no cyanosis or edema  Lab Results:  [facility] 01/31/11 0256 01/30/11 2031  WBC 9.4 12.2*  HGB 10.6* 12.5  HCT 30.4* 37.0  PLT 134* 186   BMET:  Basename 01/31/11 0256 01/31/11 0255  NA 140 141  K 4.1 4.1  CL 97 98  CO2 31 30  GLUCOSE 104* 104*  BUN 15 15  CREATININE 3.93* 3.94*  CALCIUM 8.0* 7.9*   No results found for this basename: PTH:2 in the last 72 hours Iron Studies: No results found for this basename: IRON,TIBC,TRANSFERRIN,FERRITIN in the last 72 hours  Studies/Results: Dg Lumbar Spine 2-3 Views  01/30/2011  *RADIOLOGY REPORT*  Clinical Data: 60 year old female undergoing lumbar spine surgery.  LUMBAR SPINE - 2-3 VIEW  Comparison: Intraoperative radiographs from 1450 hours the same day and earlier.  Fluoroscopy time of 0.5 minutes was utilized.  Findings: Intraoperative frontal and lateral fluoroscopic views of the lower lumbar spine demonstrate bilateral transpedicular hardware placement at L4, L5, and S1.  Interbody implant at L4-L5 and L5-S1.  Anterolisthesis of L4 on L5 re-identified.  IMPRESSION: Posterior and interbody fusion depicted at L4-L5 and L5-S1.  Original Report Authenticated By: Randall An, M.D.   Dg Lumbar Spine 2-3 Views  01/30/2011  *RADIOLOGY  REPORT*  Clinical Data: For S1 PLIF  LUMBAR SPINE - 2-3 VIEW  Comparison: 11/07/2010  Findings: Initial film shows a needle at the level of the spinous process of L4.  Second film shows tissue spreaders posteriorly with probes at the L4 and L5 laminar levels.  IMPRESSION: L4 and L5 laminar levels localized.  Original Report Authenticated By: Jules Schick, M.D.   Ct Lumbar Spine Wo Contrast  02/01/2011  *RADIOLOGY REPORT*  Clinical Data: Status post lumbar spinal surgery on 01/30/2011; now cannot lift legs, though was able to ambulate earlier in the day. Evaluate for hematoma.  CT LUMBAR SPINE WITHOUT CONTRAST  Technique:  Multidetector CT imaging of the lumbar spine was performed without intravenous contrast administration. Multiplanar CT image reconstructions were also generated.  Comparison: MRI of the lumbar spine performed 11/07/2010.  Findings: No definite significant focal hematoma is characterized. Scattered foci of air are seen along the lumbar spinal canal, suggesting against significant compression.  At the level of the decompression, significant soft tissue edema is seen; there is no definite evidence of compression of the exiting nerve roots at L4- L5 or L5-S1, though a broad-based disc protrusion is again noted at L4-L5, with persistent grade 1 anterolisthesis at this level and effacement of the foraminal fat.  Soft tissue air is noted tracking along the paraspinal musculature, with underlying edema.  Inflammation and edema are noted within the superficial soft tissues along the back.  Evaluation of the spinal canal at the level of surgery is suboptimal due to metal artifact. The lumbar spinal fusion rods at  L4-S1, with associated interbody spacers, are grossly unremarkable in appearance.  There is diffuse calcification along the abdominal aorta and its branches.  The patient appears status post right-sided nephrectomy; numerous large cysts are noted arising at the left kidney.  Mild scattered  diverticulosis is noted along the sigmoid colon.  IMPRESSION:  1.  No definite significant focal hematoma seen. 2.  Lumbar spinal fusion is grossly unremarkable in appearance. 3.  Soft tissue edema noted at the level of decompression; no definite evidence of impression on the exiting nerve roots at L4-L5 or L5-S1, though there is persistent grade 1 anterolisthesis at L4- L5 with a broad-based disc protrusion causing effacement of foraminal fat bilaterally. 4.  Scattered air tracking along the paraspinal musculature and along the lumbar spinal canal. 5.  Diffuse calcification along the abdominal aorta and branches. 6.  Status post right-sided nephrectomy; numerous large cysts arising at the left kidney. 7.  Mild scattered diverticulosis along the sigmoid colon.  Original Report Authenticated By: Santa Lighter, M.D.   Dg C-arm 1-60 Min  01/30/2011  CLINICAL DATA: L4-5, L5-S1 PLIF   C-ARM 1-60 MINUTES  Fluoroscopy was utilized by the requesting physician.  No radiographic  interpretation.      Scheduled:   . calcium carbonate  1-2 tablet Oral TID  . docusate sodium  100 mg Oral BID  . ketorolac  30 mg Intravenous Q6H  . metoprolol tartrate  25 mg Oral BID  . white petrolatum       Continuous:   . DISCONTD: sodium chloride 30 mL/hr at 01/31/11 0600    Assessment/Plan: 1. S/p lumbar decompression surgery - per Dr. Sherwood Gambler pt to be discharged to inpt rehab 2. ESRD - TTS per routine. istat K better. no heparin dialysis;continue to follow K.  3. Hypertension/volume - requesting med list from her pharmacy ([facility]) to verify meds  4. Anemia - ESA on hold. Recheck CBC now with blood loss as above (750 loss; 310 returned through cell saver)  5. Metabolic bone disease - S/p parathyroidectomy 2002  6. Nutrition - renal diet  7. Disposition- pt to transfer to inpatient rehab today      LOS: 2 days   Lorey Pallett A 02/01/2011,11:04 AM

## 2011-02-01 NOTE — Progress Notes (Signed)
Pt. Left unit in stable condition to go to inpt. Rehab. Pt. Left with all belongings.

## 2011-02-01 NOTE — Progress Notes (Signed)
Filed Vitals:   01/31/11 2200 02/01/11 0144 02/01/11 0200 02/01/11 0600  BP: 92/57 91/51 103/67 91/55  Pulse: 97 94 95 106  Temp: 98.1 F (36.7 C)  97.8 F (36.6 C) 98.1 F (36.7 C)  TempSrc:      Resp: 18  18 18   Height:      Weight:      SpO2: 96%  99% 98%    CBC  Basename 01/31/11 0256 01/30/11 2031  WBC 9.4 12.2*  HGB 10.6* 12.5  HCT 30.4* 37.0  PLT 134* 186   BMET  Basename 01/31/11 0256 01/31/11 0255  NA 140 141  K 4.1 4.1  CL 97 98  CO2 31 30  GLUCOSE 104* 104*  BUN 15 15  CREATININE 3.93* 3.94*  CALCIUM 8.0* 7.9*    Patient is seen in morning rounds in her 3000 unit bed. I was called by Dr. Ronnald Ramp about 40 minutes ago who updated me on his evaluation the patient earlier this morning.  The patient reports that she's comfortable otherwise the examiner she does have some discomfort as she changes position in the bed. She still feels that the lower from its pupil somewhat numb and are somewhat weaker than prior to surgery and in fact it is certainly the finding on examination.  On exam her incision is clean and dry the Dermabond is in place. There is no erythema swelling or drainage. Our plan is to leave it open to air. Motor exam shows some weakness of the left iliopsoas 3 to 4 minus over 5 the right iliopsoas is at least 4-4+. Quadriceps is 5 bilaterally. Left dorsiflexor is to 3 right dorsiflexor is 0-1. Plantar flexor is 5 bilaterally.  PT and OT have already been initiated and are continuing treatment.  I have reviewed her CT done earlier this morning. Interbody implants are in good position overall alignment is stable and I do agree with Dr. Ronnald Ramp that the right S1 pedicle screw is somewhat medially placed. However we do not see any bone graft material within the spinal canal or in neural foramina.  Impression: Patient clearly has increased weakness primarily L5 bilaterally. Although intraoperatively we achieved good decompression of the thecal sac and nerve  roots they had severe compression prior to surgery and she had a right foot drop prior to surgery. I suspect the increased weakness as well as numbness is primarily due to edema in the neural structures and will hopefully improve with time. I hesitate to start her on Decadron because of her borderline diabetes (which at this time is not requiring treatment).  Plan: Continue PT and OT. Consults physical medicine rehabilitation regarding comprehensive inpatient rehabilitation.  I discussed my assessment impression and plan for care with the patient and her questions were answered. I explained to her again that I will be out of town next week and will be back as of November 26 and I will sign her care to one of my partners.

## 2011-02-01 NOTE — Progress Notes (Signed)
Pt returned from radiology, sts feels a little bit better after receiving Morphine injection. Will continue to monitor.

## 2011-02-02 ENCOUNTER — Inpatient Hospital Stay (HOSPITAL_COMMUNITY): Payer: Medicare Other

## 2011-02-02 DIAGNOSIS — Q762 Congenital spondylolisthesis: Secondary | ICD-10-CM

## 2011-02-02 DIAGNOSIS — N186 End stage renal disease: Secondary | ICD-10-CM

## 2011-02-02 DIAGNOSIS — IMO0002 Reserved for concepts with insufficient information to code with codable children: Secondary | ICD-10-CM

## 2011-02-02 MED ORDER — DARBEPOETIN ALFA-POLYSORBATE 200 MCG/0.4ML IJ SOLN
200.0000 ug | INTRAMUSCULAR | Status: DC
  Administered 2011-02-02: 200 ug via INTRAVENOUS
  Filled 2011-02-02 (×2): qty 0.4

## 2011-02-02 MED ORDER — OXYCODONE HCL 5 MG PO TABS
15.0000 mg | ORAL_TABLET | Freq: Four times a day (QID) | ORAL | Status: DC | PRN
  Administered 2011-02-02 – 2011-02-03 (×3): 15 mg via ORAL
  Filled 2011-02-02 (×3): qty 3

## 2011-02-02 MED ORDER — SORBITOL 70 % SOLN
30.0000 mL | Freq: Two times a day (BID) | Status: DC | PRN
  Administered 2011-02-07: 30 mL via ORAL
  Filled 2011-02-02 (×3): qty 30

## 2011-02-02 MED ORDER — METOPROLOL TARTRATE 25 MG PO TABS
25.0000 mg | ORAL_TABLET | Freq: Two times a day (BID) | ORAL | Status: DC
  Filled 2011-02-02 (×4): qty 1

## 2011-02-02 MED ORDER — DARBEPOETIN ALFA-POLYSORBATE 200 MCG/0.4ML IJ SOLN
INTRAMUSCULAR | Status: AC
  Filled 2011-02-02: qty 0.4

## 2011-02-02 MED ORDER — GABAPENTIN 100 MG PO CAPS
100.0000 mg | ORAL_CAPSULE | Freq: Three times a day (TID) | ORAL | Status: DC
  Administered 2011-02-02 (×2): 100 mg via ORAL
  Filled 2011-02-02 (×6): qty 1

## 2011-02-02 NOTE — Progress Notes (Signed)
HPI: 60 year old black female with history ESRD  admitted 01/30/2011 with increased low back pain since August of 2012 and progressive right foot drop. X-rays and imaging showed a grade 2 dynamic degenerative spondylolisthesis of lumbar L4 and lumbar L5. MRI revealed severe multi-factorial spinal stenosis at lumbar L4-L5 and marked to severe multifactorial stenosis at lumbar 5 and S1. Patient underwent lumbar L4 and S1 lumbar laminectomy fasciectomy and foraminotomies with decompression as well as lumbar 4 and 5 and lumbar L5 and S1 posterior lumbar interbody arthrodesis on 11/14 per Dr. Sherwood Gambler. She was fitted with a back brace when out of bed. Noted on early morning of November 16 was some increased foot drop and followup per Dr. Sherley Bounds MRI completed showing no obvious hematoma.Dr.Nudelman felt  may be due to some cord inflammation but no current plan for decadron at present. Patient currently requiring moderate assistance to ambulate 60 feet with a rolling walker. Therapies noted decrease in balance and knees buckling secondary to fatigue.On going dialysis as directed. Inpatient rehabilitation services was consulted at the request of physical and occupational therapy for consideration of inpatient rehabilitation services.    Feels well- no new c/os  Review of Systems   Constitutional: Positive for malaise/fatigue.   Eyes: Negative for blurred vision.   Respiratory: Negative.   Cardiovascular: Negative for chest pain and leg swelling.   Gastrointestinal: Positive for constipation. Negative for nausea.   Genitourinary: Negative for urgency.   Musculoskeletal: Positive for back pain.   Neurological: Positive for weakness. Negative for dizziness, tremors and headaches.   Psychiatric/Behavioral: Negative for depression            Past Medical History   Diagnosis  Date   .  Seizures         r/t HTN in 1990's x 1   .  Brain aneurysm     .  Blood transfusion   1990's       r/t Kidney removal surgery   .  Arthritis         Back   .  Anemia     .  Umbilical hernia  age 87   .  Heart murmur         Born with heart mumur, does not require follow up per pt   .  Hypertension         Does not see a heart doctor, had pre transplant stress test at [facility]     .  Diabetes mellitus         Borderline   .  End stage renal disease on dialysis         T/Th/Sat dialysis on [employer]   .  End stage renal disease on dialysis         01-29-2011   .  Hepatitis C         Past Surgical History   Procedure  Date   .  Nephrectomy  2010       right side done at [facility] on transplant list   .  Umbilical hernia repair  age 14- 20   .  Appendectomy  1960's   .  Vascular surgery         right arm dialysis graft   .  Tonsillectomy         as a child   .  Rib resection  d/t kidney removal       Family History   Problem  Relation  Age of Onset   .  Anesthesia problems  Neg Hx        Social History:  reports that she quit smoking about 5 months ago. Her smoking use included Cigarettes. She has a 24 pack-year smoking history. She has never used smokeless tobacco. She reports that she drinks about 1.2 ounces of alcohol per week. She reports that she does not use illicit drugs. Allergies:   Allergies   Allergen  Reactions   .  Contrast Media (Iodinated Diagnostic Agents)         Seziure   .  Iohexol  Other (See Comments)       Reaction is convulsions       Medications Prior to Admission   Medication  Dose  Route  Frequency  Provider  Last Rate  Last Dose   .  0.9 %  sodium chloride infusion   100 mL  Intravenous  PRN  Myriam Jacobson, PA         .  0.9 %  sodium chloride infusion   100 mL  Intravenous  PRN  Myriam Jacobson, PA         .  acetaminophen (TYLENOL) tablet 650 mg   650 mg  Oral  Q4H PRN  Hosie Spangle           Or   .  acetaminophen (TYLENOL) suppository 650 mg   650 mg  Rectal  Q4H PRN  Hosie Spangle         .   alteplase (CATHFLO ACTIVASE) injection 2 mg   2 mg  Intracatheter  Once PRN  Myriam Jacobson, PA         .  alum & mag hydroxide-simeth (MAALOX PLUS) 400-400-40 MG/5ML suspension 30 mL   30 mL  Oral  Q6H PRN  Hosie Spangle         .  calcium carbonate (dosed in mg elemental calcium) suspension 500 mg of elemental calcium   500 mg of elemental calcium  Oral  Q6H PRN  Myriam Jacobson, PA         .  calcium carbonate (TUMS - dosed in mg elemental calcium) chewable tablet 200-400 mg of elemental calcium   1-2 tablet  Oral  TID  Okey Regal Nudelman     400 mg of elemental calcium at 02/01/11 0941   .  camphor-menthol (SARNA) lotion 1 application   1 application  Topical  [id number] PRN  Myriam Jacobson, PA           And   .  hydrOXYzine (ATARAX/VISTARIL) tablet 25 mg   25 mg  Oral  Q8H PRN  Myriam Jacobson, PA         .  ceFAZolin (ANCEF) IVPB 1 g/50 mL premix   1 g  Intravenous  60 min Pre-Op  Okey Regal Nudelman     1 g at 01/30/11 1815   .  cyclobenzaprine (FLEXERIL) tablet 10 mg   10 mg  Oral  TID PRN  Okey Regal Nudelman     10 mg at 01/31/11 0504   .  docusate sodium (COLACE) capsule 100 mg   100 mg  Oral  BID  Okey Regal Nudelman     100 mg at 02/01/11 0941   .  docusate sodium (ENEMEEZ) enema 283 mg   1 enema  Rectal  PRN  Myriam Jacobson, PA         .  feeding supplement (NEPRO CARB STEADY) liquid 237 mL   237 mL  Oral  TID PRN  Myriam Jacobson, PA         .  heparin injection 1,000 Units   1,000 Units  Dialysis  PRN  Myriam Jacobson, PA         .  HYDROcodone-acetaminophen (NORCO) 5-325 MG per tablet 1-2 tablet   1-2 tablet  Oral  Q4H PRN  Hosie Spangle     2 tablet at 01/31/11 1636   .  hydrOXYzine (ATARAX/VISTARIL) tablet 50 mg   50 mg  Oral  Q3H PRN  Hosie Spangle           Or   .  hydrOXYzine (VISTARIL) injection 50 mg   50 mg  Intramuscular  Q3H PRN  Hosie Spangle         .  ketorolac (TORADOL) 30 MG/ML injection 30 mg   30 mg  Intravenous  Q6H  Robert W Nudelman     30 mg  at 02/01/11 0739   .  lidocaine (XYLOCAINE) 1 % injection 5 mL   5 mL  Intradermal  PRN  Myriam Jacobson, PA         .  lidocaine-prilocaine (EMLA) cream 1 application   1 application  Topical  PRN  Myriam Jacobson, PA         .  LORazepam (ATIVAN) injection 0.5 mg   0.5 mg  Intravenous  Once  Lucrezia Starch, MD     0.5 mg at 01/31/11 0015   .  menthol-cetylpyridinium (CEPACOL) lozenge 3 mg   1 lozenge  Oral  PRN  Hosie Spangle           Or   .  phenol (CHLORASEPTIC) mouth spray 1 spray   1 spray  Mouth/Throat  PRN  Hosie Spangle         .  metoprolol tartrate (LOPRESSOR) tablet 25 mg   25 mg  Oral  BID  Hosie Spangle         .  morphine 4 MG/ML injection 4-8 mg   4-8 mg  Intramuscular  Q3H PRN  Okey Regal Nudelman     4 mg at 02/01/11 0217   .  ondansetron (ZOFRAN) injection 4 mg   4 mg  Intravenous  Once PRN  Warrick Parisian, MD         .  oxyCODONE-acetaminophen [facility]) 5-325 MG per tablet 1-2 tablet   1-2 tablet  Oral  Q4H PRN  Hosie Spangle     1 tablet at 02/01/11 0908   .  pentafluoroprop-tetrafluoroeth (GEBAUERS) aerosol 1 application   1 application  Topical  PRN  Myriam Jacobson, PA         .  promethazine (PHENERGAN) tablet 12.5 mg   12.5 mg  Oral  Q6H PRN  Myriam Jacobson, PA           Or   .  promethazine (PHENERGAN) suppository 25 mg   25 mg  Rectal  BID PRN  Myriam Jacobson, PA         .  sorbitol 70 % solution 30 mL   30 mL  Oral  PRN  Myriam Jacobson, PA         .  white petrolatum (VASELINE)  gel                  .  zolpidem (AMBIEN) tablet 5 mg   5 mg  Oral  QHS PRN  Hosie Spangle         .  DISCONTD: 0.45 % sodium chloride infusion     Intravenous  Continuous  Okey Regal Nudelman  30 mL/hr at 01/31/11 0600      .  DISCONTD: 0.9 %  sodium chloride infusion   250 mL  Intravenous  Continuous  Hosie Spangle         .  DISCONTD: 50,000 units bacitracin in 0.9% normal saline 250 mL irrigation       PRN  Hosie Spangle         .  DISCONTD:  acetaminophen (TYLENOL) suppository 650 mg   650 mg  Rectal  Q6H PRN  Myriam Jacobson, PA         .  DISCONTD: acetaminophen (TYLENOL) tablet 650 mg   650 mg  Oral  Q6H PRN  Myriam Jacobson, PA         .  DISCONTD: bupivacaine (MARCAINE) 0.5 % injection       PRN  Okey Regal Nudelman     20 mL at 01/30/11 1455   .  DISCONTD: diphenhydrAMINE (BENADRYL) 12.5 MG/5ML elixir 12.5 mg   12.5 mg  Oral  Q6H PRN  Hosie Spangle         .  DISCONTD: diphenhydrAMINE (BENADRYL) injection 12.5 mg   12.5 mg  Intravenous  Q6H PRN  Hosie Spangle         .  DISCONTD: hemostatic agents       PRN  Hosie Spangle     1 application at [id number]   .  DISCONTD: HYDROmorphone (DILAUDID) injection 0.25-0.5 mg   0.25-0.5 mg  Intravenous  Q5 min PRN  Warrick Parisian, MD     0.5 mg at 01/30/11 1942   .  DISCONTD: ketorolac (TORADOL) 30 MG/ML injection 30 mg   30 mg  Intravenous  Once  [employer]         .  DISCONTD: lidocaine-EPINEPHrine (XYLOCAINE W/EPI) 1 %-1:100000 (with pres) injection       PRN  Okey Regal Nudelman     20 mL at 01/30/11 1455   .  DISCONTD: LORazepam (ATIVAN) 2 MG/ML injection                  .  DISCONTD: morphine 1 MG/ML PCA injection     Intravenous  Q4H  Hosie Spangle         .  DISCONTD: naloxone [facility]) injection 0.4 mg   0.4 mg  Intravenous  PRN  Hosie Spangle         .  DISCONTD: ondansetron (ZOFRAN) injection 4 mg   4 mg  Intravenous  Q6H PRN  Hosie Spangle         .  DISCONTD: sodium chloride 0.9 % injection 3 mL   3 mL  Intravenous  Q12H  Hosie Spangle         .  DISCONTD: sodium chloride 0.9 % injection 3 mL   3 mL  Intravenous  PRN  Hosie Spangle         .  DISCONTD: sodium chloride 0.9 % injection 9 mL   9 mL  Intravenous  PRN  Hosie Spangle         .  DISCONTD: sodium chloride irrigation 0.9 %       PRN  Okey Regal Nudelman     1,000 mL at 01/30/11 1455   .  DISCONTD: sodium chloride irrigation 0.9 %       PRN  Okey Regal Nudelman     1,000 mL at  01/30/11 1739   .  DISCONTD: thrombin spray       PRN  Okey Regal Nudelman     20,000 Units at 01/30/11 1455   .  DISCONTD: zolpidem (AMBIEN) tablet 5 mg   5 mg  Oral  QHS PRN  Myriam Jacobson, PA             Medications Prior to Admission   Medication  Sig  Dispense  Refill   .  hydrOXYzine (ATARAX/VISTARIL) 25 MG tablet  Take 25 mg by mouth every 6 (six) hours as needed.                Home: Home Living Lives With: Alone Receives Help From: Other (Comment) (Daughter and friends to come and stay.) Type of Home: House Home Layout: One level Home Access: Stairs to enter Entrance Stairs-Rails: Right Entrance Stairs-Number of Steps: 2 Home Adaptive Equipment: None    Functional History: Prior Function Level of Independence: Independent with basic ADLs;Independent with homemaking with ambulation;Independent with gait;Independent with transfers Able to Take Stairs?: Yes Driving: Yes   Functional Status:   Mobility: Bed Mobility Bed Mobility: Yes Rolling Left: 4: Min assist Rolling Left Details (indicate cue type and reason): Assist to facilitate rotation of trunk with verbal cues for sequence. Left Sidelying to Sit: 4: Min assist;HOB flat;With rails Left Sidelying to Sit Details (indicate cue type and reason): Assist to translate trunk to midline with cues for sequence. Transfers Transfers: Yes Sit to Stand: 4: Min assist;From bed Sit to Stand Details (indicate cue type and reason): Assist to balance with cues for hand placement and safety. Stand to Sit: 4: Min assist;To chair/3-in-1 Stand to Sit Details: Assist to eccentric control of trunk with cues for safest hand placement. Ambulation/Gait Ambulation/Gait: Yes Ambulation/Gait Assistance: 3: Mod assist Ambulation/Gait Assistance Details (indicate cue type and reason): Assist for balance secondary to left lean with decreased strength of bilateral LEs (with bilateral knees often buckling especially with fatigue).  Cues to  stay inside RW and extend trunk.  Scissoring at feet.  Cues for heel strike throughout distance. Ambulation Distance (Feet): 60 Feet Assistive device: Rolling walker Gait Pattern: Decreased step length - right;Decreased step length - left;Decreased dorsiflexion - right;Decreased dorsiflexion - left;Right steppage;Left steppage;Scissoring;Trunk flexed Stairs: No Wheelchair Mobility Wheelchair Mobility: No   ADL:     Cognition: Cognition Arousal/Alertness: Awake/alert Orientation Level: Oriented X4 Cognition Arousal/Alertness: Awake/alert Overall Cognitive Status: Appears within functional limits for tasks assessed Orientation Level: Oriented X4   BP Readings from Last 3 Encounters:  02/02/11 86/52  02/01/11 90/58  02/01/11 90/58    Blood pressure 90/54, pulse 106, temperature 98.1 F (36.7 C), temperature source Oral, resp. rate 18, height 5\' 4"  (1.626 m), weight 60.2 kg (132 lb 11.5 oz), SpO2 98.00%. Physical Exam  Constitutional: She is oriented to person, place, and time. She appears well-developed.   HENT:   Head: Normocephalic.   Neck: Normal range of motion. Neck supple.   Cardiovascular: Normal rate and rhythmn  Gr 3/6 SEM loudest at base  No murmur heard.   Abdomen soft,  non tender with intact bowel sounds. Pulmonary/Chest: Effort normal. Chest clear,  Neurological: She is alert and oriented to person, place, and time. Left dorsiflexor is 1-2, right dorsiflexor is 0-1.Plantar flexion 4-5 bilat. Proximal legs are grossly 3-4/5 with pain inhibition.  Patient with diminished sensation over the medial leg, foot, and into the heel.  DTR's are 1+ at the knees, absent at the ankles. Cognitively she's alert and appropriate.  CN exam nl. Skin:  Back incision dressed/clean/dry.Dermabond in place without erythema.  Psychiatric: She has a normal mood and affect      Results for orders placed during the hospital encounter of 01/30/11 (from the past 48 hour(s))   POCT I-STAT  4, (NA,K, GLUC, HGB,HCT)     Status: Normal     Collection Time     01/30/11 12:16 PM       Component  Value  Range  Comment     Sodium  138   135 - 145 (mEq/L)       Potassium  5.0   3.5 - 5.1 (mEq/L)       Glucose, Bld  77   70 - 99 (mg/dL)       HCT  40.0   36.0 - 46.0 (%)       Hemoglobin  13.6   12.0 - 15.0 (g/dL)     BASIC METABOLIC PANEL     Status: Abnormal     Collection Time     01/30/11  8:31 PM       Component  Value  Range  Comment     Sodium  138   135 - 145 (mEq/L)       Potassium  5.9 (*)  3.5 - 5.1 (mEq/L)       Chloride  96   96 - 112 (mEq/L)       CO2  24   19 - 32 (mEq/L)       Glucose, Bld  111 (*)  70 - 99 (mg/dL)       BUN  42 (*)  6 - 23 (mg/dL)       Creatinine, Ser  7.74 (*)  0.50 - 1.10 (mg/dL)       Calcium  7.2 (*)  8.4 - 10.5 (mg/dL)       GFR calc non Af Amer  5 (*)  >90 (mL/min)       GFR calc Af Amer  6 (*)  >90 (mL/min)     CBC     Status: Abnormal     Collection Time     01/30/11  8:31 PM       Component  Value  Range  Comment     WBC  12.2 (*)  4.0 - 10.5 (K/uL)       RBC  4.30   3.87 - 5.11 (MIL/uL)       Hemoglobin  12.5   12.0 - 15.0 (g/dL)       HCT  37.0   36.0 - 46.0 (%)       MCV  86.0   78.0 - 100.0 (fL)       MCH  29.1   26.0 - 34.0 (pg)       MCHC  33.8   30.0 - 36.0 (g/dL)       RDW  14.9   11.5 - 15.5 (%)       Platelets  186   150 - 400 (K/uL)     RENAL FUNCTION PANEL  Status: Abnormal     Collection Time     01/31/11  2:55 AM       Component  Value  Range  Comment     Sodium  141   135 - 145 (mEq/L)       Potassium  4.1   3.5 - 5.1 (mEq/L)  DELTA CHECK NOTED     Chloride  98   96 - 112 (mEq/L)       CO2  30   19 - 32 (mEq/L)       Glucose, Bld  104 (*)  70 - 99 (mg/dL)       BUN  15   6 - 23 (mg/dL)  DELTA CHECK NOTED     Creatinine, Ser  3.94 (*)  0.50 - 1.10 (mg/dL)  DELTA CHECK NOTED     Calcium  7.9 (*)  8.4 - 10.5 (mg/dL)       Phosphorus  2.9   2.3 - 4.6 (mg/dL)       Albumin  3.0 (*)  3.5 - 5.2 (g/dL)        GFR calc non Af Amer  11 (*)  >90 (mL/min)       GFR calc Af Amer  13 (*)  >90 (mL/min)     BASIC METABOLIC PANEL     Status: Abnormal     Collection Time     01/31/11  2:56 AM       Component  Value  Range  Comment     Sodium  140   135 - 145 (mEq/L)       Potassium  4.1   3.5 - 5.1 (mEq/L)  DELTA CHECK NOTED     Chloride  97   96 - 112 (mEq/L)       CO2  31   19 - 32 (mEq/L)       Glucose, Bld  104 (*)  70 - 99 (mg/dL)       BUN  15   6 - 23 (mg/dL)  DELTA CHECK NOTED     Creatinine, Ser  3.93 (*)  0.50 - 1.10 (mg/dL)  DELTA CHECK NOTED     Calcium  8.0 (*)  8.4 - 10.5 (mg/dL)       GFR calc non Af Amer  11 (*)  >90 (mL/min)       GFR calc Af Amer  13 (*)  >90 (mL/min)     CBC     Status: Abnormal     Collection Time     01/31/11  2:56 AM       Component  Value  Range  Comment     WBC  9.4   4.0 - 10.5 (K/uL)       RBC  3.50 (*)  3.87 - 5.11 (MIL/uL)       Hemoglobin  10.6 (*)  12.0 - 15.0 (g/dL)       HCT  30.4 (*)  36.0 - 46.0 (%)       MCV  86.9   78.0 - 100.0 (fL)       MCH  30.3   26.0 - 34.0 (pg)       MCHC  34.9   30.0 - 36.0 (g/dL)       RDW  14.9   11.5 - 15.5 (%)       Platelets  134 (*)  150 - 400 (K/uL)  DELTA CHECK NOTED      Dg Lumbar  Spine 2-3 Views   01/30/2011  *RADIOLOGY REPORT*  Clinical Data: 60 year old female undergoing lumbar spine surgery.  LUMBAR SPINE - 2-3 VIEW  Comparison: Intraoperative radiographs from 1450 hours the same day and earlier.  Fluoroscopy time of 0.5 minutes was utilized.  Findings: Intraoperative frontal and lateral fluoroscopic views of the lower lumbar spine demonstrate bilateral transpedicular hardware placement at L4, L5, and S1.  Interbody implant at L4-L5 and L5-S1.  Anterolisthesis of L4 on L5 re-identified.  IMPRESSION: Posterior and interbody fusion depicted at L4-L5 and L5-S1.  Original Report Authenticated By: Randall An, M.D.    Dg Lumbar Spine 2-3 Views   01/30/2011  *RADIOLOGY REPORT*  Clinical Data: For S1 PLIF   LUMBAR SPINE - 2-3 VIEW  Comparison: 11/07/2010  Findings: Initial film shows a needle at the level of the spinous process of L4.  Second film shows tissue spreaders posteriorly with probes at the L4 and L5 laminar levels.  IMPRESSION: L4 and L5 laminar levels localized.  Original Report Authenticated By: Jules Schick, M.D.    Ct Lumbar Spine Wo Contrast   02/01/2011  *RADIOLOGY REPORT*  Clinical Data: Status post lumbar spinal surgery on 01/30/2011; now cannot lift legs, though was able to ambulate earlier in the day. Evaluate for hematoma.  CT LUMBAR SPINE WITHOUT CONTRAST  Technique:  Multidetector CT imaging of the lumbar spine was performed without intravenous contrast administration. Multiplanar CT image reconstructions were also generated.  Comparison: MRI of the lumbar spine performed 11/07/2010.  Findings: No definite significant focal hematoma is characterized. Scattered foci of air are seen along the lumbar spinal canal, suggesting against significant compression.  At the level of the decompression, significant soft tissue edema is seen; there is no definite evidence of compression of the exiting nerve roots at L4- L5 or L5-S1, though a broad-based disc protrusion is again noted at L4-L5, with persistent grade 1 anterolisthesis at this level and effacement of the foraminal fat.  Soft tissue air is noted tracking along the paraspinal musculature, with underlying edema.  Inflammation and edema are noted within the superficial soft tissues along the back.  Evaluation of the spinal canal at the level of surgery is suboptimal due to metal artifact. The lumbar spinal fusion rods at L4-S1, with associated interbody spacers, are grossly unremarkable in appearance.  There is diffuse calcification along the abdominal aorta and its branches.  The patient appears status post right-sided nephrectomy; numerous large cysts are noted arising at the left kidney.  Mild scattered diverticulosis is noted along the  sigmoid colon.  IMPRESSION:  1.  No definite significant focal hematoma seen. 2.  Lumbar spinal fusion is grossly unremarkable in appearance. 3.  Soft tissue edema noted at the level of decompression; no definite evidence of impression on the exiting nerve roots at L4-L5 or L5-S1, though there is persistent grade 1 anterolisthesis at L4- L5 with a broad-based disc protrusion causing effacement of foraminal fat bilaterally. 4.  Scattered air tracking along the paraspinal musculature and along the lumbar spinal canal. 5.  Diffuse calcification along the abdominal aorta and branches. 6.  Status post right-sided nephrectomy; numerous large cysts arising at the left kidney. 7.  Mild scattered diverticulosis along the sigmoid colon.  Original Report Authenticated By: Santa Lighter, M.D.    Dg C-arm 1-60 Min   01/30/2011  CLINICAL DATA: L4-5, L5-S1 PLIF   C-ARM 1-60 MINUTES  Fluoroscopy was utilized by the requesting physician.  No radiographic  interpretation.  Post Admission Physician Evaluation: Functional deficits secondary  to lumbar stenosis and radiculopathy s/p decompression and fusion with post-operative foot drop. Patient is admitted to receive collaborative, interdisciplinary care between the physiatrist, rehab nursing staff, and therapy team. Patient's level of medical complexity and substantial therapy needs in context of that medical necessity cannot be provided at a lesser intensity of care such as a SNF. Patient has experienced substantial functional loss from his/her baseline which was documented above under the "Functional History" and "Functional Status" headings.  Judging by the patient's diagnosis, physical exam, and functional history, the patient has potential for functional progress which will result in measurable gains while on inpatient rehab.  These gains will be of substantial and practical use upon discharge  in facilitating mobility and self-care at the household  level. Physiatrist will provide 24 hour management of medical needs as well as oversight of the therapy plan/treatment and provide guidance as appropriate regarding the interaction of the two. 24 hour rehab nursing will assist with bladder management, bowel management, safety, skin/wound care, medication administration, pain management and patient education  and help integrate therapy concepts, techniques,education, etc. PT will assess and treat for: balance, endurance, locomotion, strength, transferring and adaptive equipment and orthotic training. Goals are: independent with assistive device. OT will assess and treat for: bathing, bowel/bladder control, locomotion, toileting and transferring .  Goals are: independent with assistive device.   SLP will assess and treat for: not applicable.  Goals are: N/A. Case Management and Social Worker will assess and treat for psychological issues and discharge planning. Team conference will be held weekly to assess progress toward goals and to determine barriers to discharge. Patient will receive at least 3 hours of therapy per day at least 5 days per week. ELOS and Prognosis: 2 weeks excellent     Medical Problem List and Plan: 1.Lumbar stenosis with radiculopathy.Lumbar 4-5 and L5-S1 Laminectomy and fusion 11/14 2. DVT Prophylaxis/Anticoagulation: SCD'S, consider sq lovenox 3. Pain Management: Norco,robaxin.Monitor with activity.  Appears to be comfortable at rest, but having some problems with activity. Consider scheduling medications prior to rehab sessions.   4.ESRD-dialysis per [facility].Weigh daily.Follow up labs.Strict i/o;s. Schedule HD post therapy day so as not to interfere with schedule and patient progress. 5.HTN-lopressor.Monitor with increased activity.  She is normotensive today.  Will hold metoprolol for SBP< 90. 6. Mood:Ego support.  She is in good spirits at present. 7.Acute on chronic anemia:  hgb stable.  Intervention per  [facility].         02/01/2011, 9:45 AM

## 2011-02-02 NOTE — Progress Notes (Signed)
Occupational Therapy Session Note  Patient Details  Name: [name] MRN: [id number] Date of Birth: 1950/05/19  Today's Date: 02/02/2011 Time: [id number] Time Calculation (min): 45 min  Precautions: Precautions Precautions: Back Required Braces or Orthoses: Yes Spinal Brace: Applied in sitting position Restrictions Weight Bearing Restrictions: No  Short Term Goals: OT Short Term Goal 1: Pt. will engage in bathing at supervision level using AE prn.  OT Short Term Goal 2: Pt. will engage in dressing at supervision level using AE Prn. OT Short Term Goal 3: Pt. will transfer to toilet with supervision level  OT Short Term Goal 4: Pt. will perform toileting at minimal assist level OT Short Term Goal 5: Pt. will transfer to tub/shower at minimal assist level  Skilled Therapeutic Interventions/Progress Updates:  Pain still high (6/10)  Addressed bed mobility for getting Edge of bed and donned brace, back precautions.  Required instructional cues for proper positioning.  Pt verbalized back precautions.   VitPain Pain Assessment Pain Assessment: 0-10 Pain Score:   6 Pain Type: Surgical pain;Neuropathic pain Pain Location: Leg Pain Orientation: Lower Pain Descriptors: Numbness;Aching;Sharp Pain Frequency: Intermittent Pain Intervention(s): Medication (See eMAR)  Sherry Ruffing, OT

## 2011-02-02 NOTE — Progress Notes (Signed)
Occupational Therapy Assessment and Plan  Patient Details  Name: [name] MRN: [id number] Date of Birth: Apr 11, 1950  OT Diagnosis: acute pain, lumbago (low back pain) and muscle weakness (generalized) Rehab Potential: Rehab Potential: Good ELOS: 2 weeks   Assessment & Plan Clinical Impression: Patient is a 60 y.o. year old female with recent admission to the hospital on 01/30/11 with right foot drop, back pain   Pt. Had L4/S1 lam/decompression; L4,5,S1 artherodesis.  PMH includes dialysis, seizures, arthritis, anemia, hernia.  Patient transferred to CIR on 02/01/2011 .  Patient's past medical history is significant for  PM dialysis, seizures, arthritis, anemia, hernia.    Patient currently requires mod with basic self-care skills and IADL secondary to muscle weakness and increased back pain with activity.  Prior to hospitalization, patient could complete BADL/IADLwith independence.  Patient will benefit from skilled intervention to increase independence with basic self-care skills and increase level of independence with iADL prior to discharge home independently.  Anticipate patient will require intermittent supervision and follow up home health.  OT - End of Session Activity Tolerance: Decreased this session;Tolerates 10 - 20 min activity with multiple rests Endurance Deficit: Yes Endurance Deficit Description: Pt.'s activity was limited by increased pain as session progressed OT Assessment Rehab Potential: Good Barriers to Discharge:  (increased back pain) OT Plan OT Frequency: 1-2 X/day, 60-90 minutes Estimated Length of Stay: 2 weeks OT Treatment/Interventions: Balance/vestibular training;DME/adaptive equipment instruction;Functional mobility training;Pain management;Patient/family education;Self Care/advanced ADL retraining;Therapeutic Exercise;Therapeutic Activities;UE/LE Strength taining/ROM;UE/LE Coordination activities  Precautions/Restrictions  Precautions Precautions:  Back Required Braces or Orthoses: Yes Spinal Brace: Applied in sitting position General   Vital Signs   Pain Pain Assessment Pain Score:   7 (7/10) Pain Type: Surgical pain Pain Location:  (shooting pain down right leg (sharp)) Pain Radiating Towards:  (pain feels like it's going through the vein) Pain Intervention(s): RN made aware;Repositioned Home Living/Prior Functioning Home Living Lives With: Alone Receives Help From: Family;Friend(s) Type of Home: House Home Layout: One level Home Access: Stairs to enter [employer] of Steps: 1 Prior Function Level of Independence: Independent with basic ADLs;Independent with homemaking with ambulation Able to Take Stairs?: Yes Driving: Yes ADL   Vision/Perception  Vision - History Baseline Vision: Bifocals  Cognition Overall Cognitive Status: Appears within functional limits for tasks assessed Sensation Sensation Light Touch: Impaired Detail (numb bilateral feet) Proprioception: Impaired by gross assessment Coordination Gross Motor Movements are Fluid and Coordinated: Yes Fine Motor Movements are Fluid and Coordinated: Yes Motor  Motor Motor: Abnormal postural alignment and control Mobility  Bed Mobility Rolling Left: 4: Min assist Rolling Left Details: Verbal cues for precautions/safety;Verbal cues for technique;Tactile cues for weight shifting;Manual facilitation for weight shifting;Verbal cues for sequencing Left Sidelying to Sit: 4: Min assist Left Sidelying to Sit Details: Manual facilitation for placement;Verbal cues for precautions/safety;Verbal cues for technique;Verbal cues for sequencing Left Sidelying to Sit Details (indicate cue type and reason): educated on log roll for back precautions Transfers Transfers: Yes Sit to Stand: 2: Max assist Sit to Stand Details: Tactile cues for posture;Manual facilitation for weight shifting;Verbal cues for technique  Trunk/Postural Assessment  Cervical  Assessment Cervical Assessment: Within Functional Limits Lumbar Assessment Lumbar Assessment: Exceptions to [facility] (limited by back precautions, unable to fully extend trunk)  Balance Balance Balance Assessed: Yes Static Sitting Balance Static Sitting - Level of Assistance: 5: Stand by assistance Dynamic Sitting Balance Dynamic Sitting - Level of Assistance: 4: Min assist Dynamic Standing Balance Dynamic Standing - Level of Assistance: 2:  Max assist Extremity/Trunk Assessment RUE Assessment RUE Assessment: Within Functional Limits LUE Assessment LUE Assessment: Within Functional Limits  Recommendations for other services: Other: none  Discharge Criteria: Patient will be discharged from OT if patient refuses treatment 3 consecutive times without medical reason, if treatment goals not met, if there is a change in medical status, if patient makes no progress towards goals or if patient is discharged from hospital.  The above assessment, treatment plan, treatment alternatives and goals were discussed and mutually agreed upon: by family  Lisa Roca 02/02/2011, 2:54 PM

## 2011-02-02 NOTE — Progress Notes (Signed)
Subjective:  Back pain s/p rehab this AM; otherwise, no complaints.  Objective: Vital signs in last 24 hours: Temp:  [98.3 F (36.8 C)-98.9 F (37.2 C)] 98.9 F (37.2 C) (11/17 0432) Pulse Rate:  [83-100] 83  (11/17 0432) Resp:  [17-20] 20  (11/17 0432) BP: (86-105)/(52-73) 86/52 mmHg (11/17 0432) SpO2:  [92 %-97 %] 92 % (11/17 0432) Weight:  [58.877 kg (129 lb 12.8 oz)] 129 lb 12.8 oz (58.877 kg) (11/16 1834) Weight change:   Intake/Output from previous day: 11/16 0701 - 11/17 0700 In: 240 [P.O.:240] Out: -  Intake/Output this shift: Total I/O In: 240 [P.O.:240] Out: -  EXAM: General appearance:  Alert, in no apparent distress.   Resp:  Clear to auscultation. Cardio:  RRR with Gr 2/6 systolic murmur. GI:  = BS, soft and nontender.  Extremities:  No edema. Access:  AVG @ RUA.  Lab Results:  [facility] 02/01/11 1015 01/31/11 0256  WBC 6.7 9.4  HGB 9.3* 10.6*  HCT 28.2* 30.4*  PLT 136* 134*   BMET:  Basename 02/01/11 1015 01/31/11 0256 01/31/11 0255  NA 134* 140 --  K 4.1 4.1 --  CL 91* 97 --  CO2 27 31 --  GLUCOSE 101* 104* --  BUN 31* 15 --  CREATININE 7.64* 3.93* --  CALCIUM 7.9* 8.0* --  ALBUMIN 2.9* -- 3.0*   No results found for this basename: PTH:2 in the last 72 hours Iron Studies: No results found for this basename: IRON,TIBC,TRANSFERRIN,FERRITIN in the last 72 hours  Studies/Results: @RISRSLT24 @  Assessment/Plan: 1.  Lumbar stenosis - s/p lumbar decompression and stabilization surgery per Dr. Sherwood Gambler on 11/14, now @ 38 for rehab. 2.  ESRD - on HD on TTS, K stable @ 4.1, HD pending today. 3.  HTN/Volume - BP slightly low, but stable, asymptomatic. 4.  Anemia - Hgb dropping, 9.3, off Epo.  Aranesp per HD today.   5.  Secondary hyperparathyroidism - s/p parathyroidectomy in 2002, on Calcium Carbonate.   LOS: 1 day   [name],[name] 02/02/2011,11:06 AM  Renal Attending:  Patient seen and examined and above reviewed.  Stable from a renal  standpoint after lumbar surgery for spondylolisthesis with spinal stenosis. For dialysis today.  Next dialysis will be Monday (holiday schedule for usual TTS HD).  Roney Jaffe, MD 02/02/2011, 7:56 PM

## 2011-02-02 NOTE — Progress Notes (Signed)
Physical Therapy Note  Patient Details  Name: [name] MRN: [id number] Date of Birth: [birth year] Today's Date: 02/02/2011  Time: 1500-1535 35 minutes  Pt c/o 10/10 pain in back and R leg. RN aware, rests as needed.  Repetitive squat/scoot pivot transfers bed <-> w/c for increased pt independence.  Pt able to perform with min A to close supervision at end of session. Required cues for safe technique and hand/feet placement.  Sit to stand training from elevated surface with focus on improved upright posture. Pt performed 3x10 glut sets in standing to promote trunk and hip extension in stance. Pt stands with weight heavily on UE's and decreased to no wt bearing on R LE due to pain.  Individual Therapy   Celso Granja 02/02/2011, 4:40 PM

## 2011-02-02 NOTE — Progress Notes (Signed)
Physical Therapy Assessment and Plan  Patient Details  Name: [name] MRN: [id number] Date of Birth: April 18, 1950  PT Diagnosis: Abnormal posture, Difficulty walking, Impaired sensation and Muscle weakness, acute pain Rehab Potential:  good ELOS:   2 weeks  Assessment & Plan Clinical Impression: 60 year old black female with history ESRD admitted 01/30/2011 with increased low back pain since August of 2012 and progressive right foot drop. X-rays and imaging showed a grade 2 dynamic degenerative spondylolisthesis of lumbar L4 and lumbar L5. MRI revealed severe multi-factorial spinal stenosis at lumbar L4-L5 and marked to severe multifactorial stenosis at lumbar 5 and S1. Patient underwent lumbar L4 and S1 lumbar laminectomy fasciectomy and foraminotomies with decompression as well as lumbar 4 and 5 and lumbar L5 and S1 posterior lumbar interbody arthrodesis on 11/14 per Dr. Sherwood Gambler. Patient transferred to CIR on 02/01/2011 .  Patient's past medical history is significant for ESRD, seizures, htn, DM, anemia, brain aneurysm .    Patient currently requires max with mobility secondary to muscle weakness and decreased sensation, increased pain.  Prior to hospitalization, patient was independent with mobility and lived with Alone in a House home.  Home access is 1Stairs to enter.  Patient will benefit from skilled PT intervention to maximize safe functional mobility, minimize fall risk and decrease caregiver burden for planned discharge home with intermittent assist.  Anticipate patient will benefit from follow up [facility] at discharge.     Precautions/Restrictions Precautions Precautions: Back Required Braces or Orthoses: Yes Spinal Brace: Applied in sitting position Restrictions Weight Bearing Restrictions: No   Pain Pain Score: 10-Worst pain ever Pain Type: Neuropathic pain;Surgical pain Pain Location: Leg, back Pain Intervention(s): RN made aware;Repositioned Home Living/Prior  Functioning Home Living Lives With: Alone Receives Help From: Family;Friend(s) Type of Home: House Home Layout: One level Home Access: Stairs to enter [employer] of Steps: 1 Prior Function Level of Independence: Independent with basic ADLs;Independent with homemaking with ambulation Able to Take Stairs?: Yes Driving: Yes  Cognition Orientation Level: Oriented X4 Sensation Sensation Light Touch: Impaired Detail (numb bilateral feet) Proprioception: Impaired by gross assessment Coordination Gross Motor Movements are Fluid and Coordinated: Yes Motor  Motor Motor: Abnormal postural alignment and control  Mobility Bed Mobility Rolling Left: 4: Min assist Rolling Left Details: Verbal cues for precautions/safety;Verbal cues for technique;Tactile cues for weight shifting;Manual facilitation for weight shifting;Verbal cues for sequencing Left Sidelying to Sit: 4: Min assist Left Sidelying to Sit Details: Manual facilitation for placement;Verbal cues for precautions/safety;Verbal cues for technique;Verbal cues for sequencing Left Sidelying to Sit Details (indicate cue type and reason): educated on log roll for back precautions Transfers Transfers: Yes Sit to Stand: 2: Max assist Sit to Stand Details: Tactile cues for posture;Manual facilitation for weight shifting;Verbal cues for technique Stand Pivot Transfers: 2: Max assist Stand Pivot Transfer Details: Manual facilitation for weight shifting;Verbal cues for precautions/safety;Verbal cues for technique;Tactile cues for posture;Tactile cues for placement Squat Pivot Transfers: 4: Min Risk manager Details: Tactile cues for sequencing;Manual facilitation for weight shifting;Verbal cues for technique;Verbal cues for sequencing Locomotion  Ambulation Ambulation/Gait Assistance: 1: +2 Total assist Ambulation Distance (Feet): 15 Feet Assistive device: Rolling walker Ambulation/Gait Assistance Details: Manual  facilitation for weight shifting;Tactile cues for posture;Verbal cues for gait pattern Ambulation/Gait Assistance Details (indicate cue type and reason): pt unable to DF bilateral feet, walks with trunk flexed, heavy wt on UE's unable to extend B knees fully, needs assist to prevent buckling B knees, scissoring Wheelchair Mobility Wheelchair Mobility: Yes  Wheelchair Assistance: 4: Advertising account executive Details:  (assist for steering) Environmental health practitioner: Both upper extremities Distance: 100'  Trunk/Postural Assessment  Cervical Assessment Cervical Assessment: Within Functional Limits Lumbar Assessment Lumbar Assessment: Exceptions to [facility] (limited by back precautions, unable to fully extend trunk)  Balance Balance Balance Assessed: Yes Static Sitting Balance Static Sitting - Level of Assistance: 5: Stand by assistance Dynamic Sitting Balance Dynamic Sitting - Level of Assistance: 4: Min assist Dynamic Standing Balance Dynamic Standing - Level of Assistance: 2: Max assist Extremity Assessment      RLE Assessment RLE Assessment: Exceptions to [facility] RLE PROM (degrees) Overall PROM Right Lower Extremity: Within functional limits for tasks assessed RLE Strength RLE Overall Strength Comments: 3/5 except dorsiflexion 1/5 LLE Assessment LLE Assessment: Exceptions to WFL LLE PROM (degrees) Overall PROM Left Lower Extremity: Within functional limits for tasks assessed LLE Strength LLE Overall Strength Comments: 3-/5 excepet dorsiflexion 1/5  Recommendations for other services: none  Discharge Criteria: Patient will be discharged from PT if patient refuses treatment 3 consecutive times without medical reason, if treatment goals not met, if there is a change in medical status, if patient makes no progress towards goals or if patient is discharged from hospital.  The above assessment, treatment plan, treatment alternatives and goals were discussed and mutually agreed upon: by  patient  PT treatment Time: 1000-1045 45 minutes  Pt in 10/10 pain, RN aware. Eval completed. Pt requested to use toilet.  SPT to toilet with max A, max A for balance while patient assist with pants up and down. Discussed back precautions during clothing management. Pt unable to tolerate sitting on toilet with BSC over it for 2 minutes due to pain.  Pt put back to bed and repositioned for comfort. Educated on log rolling and back precautions and brace precautions for all mobility tasks. Pt expresses understanding.   DONAWERTH,KAREN 02/02/2011, 12:02 PM

## 2011-02-03 ENCOUNTER — Inpatient Hospital Stay (HOSPITAL_COMMUNITY): Payer: Medicare Other

## 2011-02-03 MED ORDER — CALCIUM CARBONATE ANTACID 500 MG PO CHEW
1.0000 | CHEWABLE_TABLET | Freq: Three times a day (TID) | ORAL | Status: DC
  Administered 2011-02-03 (×3): 400 mg via ORAL
  Administered 2011-02-04: 200 mg via ORAL
  Administered 2011-02-05 – 2011-02-06 (×6): 400 mg via ORAL
  Administered 2011-02-07: 200 mg via ORAL
  Administered 2011-02-08 – 2011-02-09 (×5): 400 mg via ORAL
  Filled 2011-02-03 (×22): qty 2

## 2011-02-03 MED ORDER — SODIUM CHLORIDE 0.9 % IV BOLUS (SEPSIS)
500.0000 mL | Freq: Once | INTRAVENOUS | Status: AC
  Administered 2011-02-03: 500 mL via INTRAVENOUS

## 2011-02-03 MED ORDER — HYDROCODONE-ACETAMINOPHEN 5-325 MG PO TABS
1.0000 | ORAL_TABLET | Freq: Four times a day (QID) | ORAL | Status: DC | PRN
  Administered 2011-02-04 – 2011-02-08 (×10): 1 via ORAL
  Filled 2011-02-03 (×12): qty 1

## 2011-02-03 NOTE — Progress Notes (Signed)
Occupational Therapy Note  Patient Details  Name: [name] MRN: [id number] Date of Birth: 08/04/1950 Today's Date: 02/03/2011 Time: 1:30-1:55 Pain: 4/10.  No intervention OT addressed bed mobility, bed to wc transfers, toilet transfers.   Pt. Needed verbal instruction for hand placement when going from sit to stand.  She was mod assist with all transfers and max verbal cues for hand placement.   She had been sleeping and cognition seemed baseline and better from the morning session.      Lisa Roca 02/03/2011, 4:23 PM

## 2011-02-03 NOTE — Progress Notes (Signed)
Physical Therapy Session Note  Patient Details  Name: [name] MRN: [id number] Date of Birth: 01-17-1951  Today's Date: 02/03/2011 Time: [id number] Time Calculation (min): 63 min  Precautions: Precautions Precautions: Back Required Braces or Orthoses: Yes Spinal Brace: Applied in sitting position Restrictions Weight Bearing Restrictions: No  Short Term Goals: PT Short Term Goal 1: Pt min A functional transfers PT Short Term Goal 2: Pt min A gait 50' in controlled environment PT Short Term Goal 3: Pt min A dynamic standing balance for functional task  Skilled Therapeutic Interventions/Progress Updates:   Pt was seen today for therapeutic activities, therapeutic exercises for strengthening and gait training  Vital Signs Therapy Vitals BP: 82/56 mmHg (manual BP) Pain Pain Assessment Pain Assessment: 0-10 Pain Score: Initially rated at a 0.  After standing twice pain rated10-Worst pain ever Pain Type: Neuropathic pain;Surgical pain Pain Location: Leg (and hip area) Pain Orientation: Right;Left Pain Frequency: Intermittent Pain Onset: With Activity Pain Intervention(s): Medication (See eMAR) given by RN during session as scheduled Mobility  With HOB elevated. Pt able to move from bed to sitting on edge of bed with min A.  She was able to perform a squat pivot transfer with close S after set up.  She is able to perform sit to stand with mod A with verbal cues for correct hand placement with each attempt.    Locomotion    Pt able to propel wc from room to gym and back with B UEs with S. Pt amb with rolling walker x 5' with min A.  She has decreased hip and knee flex in swing, occasional recurvatum on the R in stance.  She was unable to achieve heelstrike B and tended to land on toes and then transition to foot flat.  On second attempt, pt able to amb 24' with RW with min with improved hip and knee flex B with heelstrike ~75% of the time.    Trunk/Postural Assessment   Stands with slight trunk flex.  Stands with increased weight bearing on B UEs.   Balance  In standing, pt requires B UE support and min A to maintain.   Exercises  In standing, with UE support of walker, able to perform 2 x 10 reps heel raises.  Attempted  Hip flex in standing, but pt requested rest break after 2 reps.     Therapy/Group: Individual Therapy  Kagen Kunath,TAMMY 02/03/2011, 10:28 AM

## 2011-02-03 NOTE — Progress Notes (Signed)
Physical Therapy Session Note  Patient Details  Name: [name] MRN: [id number] Date of Birth: 23-Feb-1951  Today's Date: 02/03/2011 Time: [id number]  Precautions: Precautions Precautions: Back Required Braces or Orthoses: Yes Spinal Brace: Applied in sitting position Restrictions Weight Bearing Restrictions: No Fall risk  Short Term Goals: PT Short Term Goal 1: Pt min A functional transfers PT Short Term Goal 2: Pt min A gait 50' in controlled environment PT Short Term Goal 3: Pt min A dynamic standing balance for functional task  Skilled Therapeutic Interventions/Progress Updates: Therapeutic exercise performed with LE to increase strength for functional mobility. Patient requesting pain meds but having difficulty keeping eyes open.  Pt demonstrated decreased problem solving skills, decreased working and short term memory, question if related to pain meds.  Pt assisted back to bed at end of session to rest.        Pain: co/ 8/10 in back, nurse aware and reports patient is not due for more pain meds for 2 hours  Mobility Transfer training: sit to stand with repetition to increase carryover of instruction for foot and hand placement and to scoot forward in chair. Unsafe uncontrolled descent x 1 which patient reports was due to pain in back. Stand pivot transfer x 2 with RW with decreased safety, flexed posture and ataxic movement of BLE, hyperthrust of L knee in stance.  Max A d/t assist to lift and lower.  Pt trying to push up from wheels rather than armrest causing chair to move and then unlocked brakes to try to fix this.   Locomotion :w/c propulsion with decreased pushstroke, controlled environment 160 x1, then 70 Supervision and then  requested to be pushed d/t fatigue in UE's   Trunk/Postural Assessment    Pt requires max instructional cues to extend hips and knees, decreased B foot clearance in standing and still unable to reach full upright posture.      Exercises   A/AROM DF seated BLE 10x2, seated A/ROM LAQ with 3 # 10 x with frequent cues to stay aroused, decreased motor control BLE noted, decreased coordination; sit to stands x 10 in steady      Therapy/Group: Individual Therapy  Othelia Pulling 02/03/2011, 2:10 PM

## 2011-02-03 NOTE — Progress Notes (Signed)
Patient alert and oriented x 3 but short term memory deficits are noted. Patient had some forgetfulness and required cueing with tasks. MD Burnice Logan notified about changes in cognitive status. New orders received. Patient received 15mg  oxy IR at 1020 for 10 out of 10 pain with significant pain reduction. However patient has been more drowsy and seemed to have more difficulty remaining on task. Patient had a temperature this evening of 102.2. Dr Burnice Logan notified- new orders received for pain and fever. Patient taken off oxy IR. 650mg  of tylenol given at 1550 for fever. Temp remained same 1 hour later. Patient blood pressure remaining low but appears unsymptomatic. Had conversation with renal doctor Grant- and discussed patients fever and BP. New orders received. Patient given 549ml bolus this evening. Back incision OTA with dermabond. No drainage or redness noted. Patient has bed alarm for safety due to memory deficits and poor safety awareness last night. Patient able to stand pivot with mod assist. Patient wears back brace OOB.

## 2011-02-03 NOTE — Progress Notes (Signed)
Subjective: No complaints, comfortable after rehab this AM.  BP low in HD yest, net UF was 2000 cc.    Objective: Vital signs in last 24 hours: Temp:  [98.1 F (36.7 C)-99.4 F (37.4 C)] 99.4 F (37.4 C) (11/18 0500) Pulse Rate:  [69-130] 69  (11/18 0500) Resp:  [20-22] 22  (11/18 0500) BP: (70-105)/(34-59) 82/56 mmHg (11/18 0918) SpO2:  [95 %-98 %] 98 % (11/18 0500) Weight:  [55.3 kg (121 lb 14.6 oz)-58.5 kg (128 lb 15.5 oz)] 121 lb 14.6 oz (55.3 kg) (11/18 0500) Weight change: -0.377 kg (-13.3 oz)  Intake/Output from previous day: 11/17 0701 - 11/18 0700 In: 480 [P.O.:480] Out: 2004 [Stool:1] Intake/Output this shift: Total I/O In: 120 [P.O.:120] Out: -  EXAM: General appearance:  Alert, in no apparent distress. Resp:  CTA B. Cardio:  RRR with Gr 2/6 systolic murmur.  GI:  + BS, soft and nontender. Extremities:  No edema. Access:  AVG @ [facility].  Lab Results:  [facility] 02/01/11 1015  WBC 6.7  HGB 9.3*  HCT 28.2*  PLT 136*   BMET:  Basename 02/01/11 1015  NA 134*  K 4.1  CL 91*  CO2 27  GLUCOSE 101*  BUN 31*  CREATININE 7.64*  CALCIUM 7.9*  ALBUMIN 2.9*   No results found for this basename: PTH:2 in the last 72 hours Iron Studies: No results found for this basename: IRON,TIBC,TRANSFERRIN,FERRITIN in the last 72 hours  Assessment/Plan: 1.  Back surgery - Spondylolisthesis with spinal stenosis s/p lumbar decompression and stabilization surgery per Dr. Sherwood Gambler on 11/14, receiving rehab. 2.  ESRD - on HD on TTS, stable s/p HD yesterday, next HD tomorrow (holiday schedule). 3.  HTN/Volume - BP low (SBP in 80s), but asymptomatic.  Stop metoprolol, probably dry from over-ultrafiltration.  NS bolus prn. 4.  Anemia - Hgb low @9 .3 on 11/16, s/p Aranesp per HD yesterday. 5.  Secondary hyperparathyroidism - s/p parathyroidectomy in 2002.    [name],CHARLES 02/03/2011,1:04 PM  Patient seen and examined- I agree with assess/plan as outlined above.  Blood pressure low  since HD yesterday.  D/C metoprolol and use NS bolus as needed for now.  Not symptomatic.  Check CBC.   Kelly Splinter, MD / Renal Attending   Cell # [phone]      02/03/2011,  4:30 PM

## 2011-02-03 NOTE — Progress Notes (Signed)
HPI: 60 year old black female with history ESRD  admitted 01/30/2011 with increased low back pain since August of 2012 and progressive right foot drop. X-rays and imaging showed a grade 2 dynamic degenerative spondylolisthesis of lumbar L4 and lumbar L5. MRI revealed severe multi-factorial spinal stenosis at lumbar L4-L5 and marked to severe multifactorial stenosis at lumbar 5 and S1. Patient underwent lumbar L4 and S1 lumbar laminectomy fasciectomy and foraminotomies with decompression as well as lumbar 4 and 5 and lumbar L5 and S1 posterior lumbar interbody arthrodesis on 11/14 per Dr. Sherwood Gambler. She was fitted with a back brace when out of bed. Noted on early morning of November 16 was some increased foot drop and followup per Dr. Sherley Bounds MRI completed showing no obvious hematoma.Dr.Nudelman felt  may be due to some cord inflammation but no current plan for decadron at present. Patient currently requiring moderate assistance to ambulate 60 feet with a rolling walker. Therapies noted decrease in balance and knees buckling secondary to fatigue.On going dialysis as directed. Inpatient rehabilitation services was consulted at the request of physical and occupational therapy for consideration of inpatient rehabilitation services.    Continues to have significant leg pain and analgesia uptitrated and Neurontin added;  Became confused during the night and staff has also noted some mild confusion during the day.  Review of Systems   Constitutional: Positive for malaise/fatigue.   Eyes: Negative for blurred vision.   Respiratory: Negative.   Cardiovascular: Negative for chest pain and leg swelling.   Gastrointestinal: Positive for constipation. Negative for nausea.   Genitourinary: Negative for urgency.   Musculoskeletal: Positive for back pain.   Neurological: Positive for weakness. Negative for dizziness, tremors and headaches.   Psychiatric/Behavioral: Negative for  depression            Past Medical History   Diagnosis  Date   .  Seizures         r/t HTN in 1990's x 1   .  Brain aneurysm     .  Blood transfusion  1990's       r/t Kidney removal surgery   .  Arthritis         Back   .  Anemia     .  Umbilical hernia  age 89   .  Heart murmur         Born with heart mumur, does not require follow up per pt   .  Hypertension         Does not see a heart doctor, had pre transplant stress test at [facility]     .  Diabetes mellitus         Borderline   .  End stage renal disease on dialysis         T/Th/Sat dialysis on [employer]   .  End stage renal disease on dialysis         01-29-2011   .  Hepatitis C         Past Surgical History   Procedure  Date   .  Nephrectomy  2010       right side done at [facility] on transplant list   .  Umbilical hernia repair  age 88- 43   .  Appendectomy  1960's   .  Vascular surgery         right arm dialysis graft   .  Tonsillectomy         as a child   .  Rib resection         d/t kidney removal       Family History   Problem  Relation  Age of Onset   .  Anesthesia problems  Neg Hx        Social History:  reports that she quit smoking about 5 months ago. Her smoking use included Cigarettes. She has a 24 pack-year smoking history. She has never used smokeless tobacco. She reports that she drinks about 1.2 ounces of alcohol per week. She reports that she does not use illicit drugs. Allergies:   Allergies   Allergen  Reactions   .  Contrast Media (Iodinated Diagnostic Agents)         Seziure   .  Iohexol  Other (See Comments)       Reaction is convulsions       Medications Prior to Admission   Medication  Dose  Route  Frequency  Provider  Last Rate  Last Dose   .  0.9 %  sodium chloride infusion   100 mL  Intravenous  PRN  Myriam Jacobson, PA         .  0.9 %  sodium chloride infusion   100 mL  Intravenous  PRN  Myriam Jacobson, PA         .  acetaminophen (TYLENOL) tablet 650 mg    650 mg  Oral  Q4H PRN  Hosie Spangle           Or   .  acetaminophen (TYLENOL) suppository 650 mg   650 mg  Rectal  Q4H PRN  Hosie Spangle         .  alteplase (CATHFLO ACTIVASE) injection 2 mg   2 mg  Intracatheter  Once PRN  Myriam Jacobson, PA         .  alum & mag hydroxide-simeth (MAALOX PLUS) 400-400-40 MG/5ML suspension 30 mL   30 mL  Oral  Q6H PRN  Hosie Spangle         .  calcium carbonate (dosed in mg elemental calcium) suspension 500 mg of elemental calcium   500 mg of elemental calcium  Oral  Q6H PRN  Myriam Jacobson, PA         .  calcium carbonate (TUMS - dosed in mg elemental calcium) chewable tablet 200-400 mg of elemental calcium   1-2 tablet  Oral  TID  Okey Regal Nudelman     400 mg of elemental calcium at 02/01/11 0941   .  camphor-menthol (SARNA) lotion 1 application   1 application  Topical  [id number] PRN  Myriam Jacobson, PA           And   .  hydrOXYzine (ATARAX/VISTARIL) tablet 25 mg   25 mg  Oral  Q8H PRN  Myriam Jacobson, PA         .  ceFAZolin (ANCEF) IVPB 1 g/50 mL premix   1 g  Intravenous  60 min Pre-Op  Okey Regal Nudelman     1 g at 01/30/11 1815   .  cyclobenzaprine (FLEXERIL) tablet 10 mg   10 mg  Oral  TID PRN  Okey Regal Nudelman     10 mg at 01/31/11 0504   .  docusate sodium (COLACE) capsule 100 mg   100 mg  Oral  BID  Okey Regal Nudelman     100 mg at 02/01/11 0941   .  docusate sodium (ENEMEEZ) enema 283 mg   1 enema  Rectal  PRN  Myriam Jacobson, PA         .  feeding supplement (NEPRO CARB STEADY) liquid 237 mL   237 mL  Oral  TID PRN  Myriam Jacobson, PA         .  heparin injection 1,000 Units   1,000 Units  Dialysis  PRN  Myriam Jacobson, PA         .  HYDROcodone-acetaminophen (NORCO) 5-325 MG per tablet 1-2 tablet   1-2 tablet  Oral  Q4H PRN  Hosie Spangle     2 tablet at 01/31/11 1636   .  hydrOXYzine (ATARAX/VISTARIL) tablet 50 mg   50 mg  Oral  Q3H PRN  Hosie Spangle           Or   .  hydrOXYzine (VISTARIL) injection 50 mg    50 mg  Intramuscular  Q3H PRN  Hosie Spangle         .  ketorolac (TORADOL) 30 MG/ML injection 30 mg   30 mg  Intravenous  Q6H  Robert W Nudelman     30 mg at 02/01/11 0739   .  lidocaine (XYLOCAINE) 1 % injection 5 mL   5 mL  Intradermal  PRN  Myriam Jacobson, PA         .  lidocaine-prilocaine (EMLA) cream 1 application   1 application  Topical  PRN  Myriam Jacobson, PA         .  LORazepam (ATIVAN) injection 0.5 mg   0.5 mg  Intravenous  Once  Lucrezia Starch, MD     0.5 mg at 01/31/11 0015   .  menthol-cetylpyridinium (CEPACOL) lozenge 3 mg   1 lozenge  Oral  PRN  Hosie Spangle           Or   .  phenol (CHLORASEPTIC) mouth spray 1 spray   1 spray  Mouth/Throat  PRN  Hosie Spangle         .  metoprolol tartrate (LOPRESSOR) tablet 25 mg   25 mg  Oral  BID  Hosie Spangle         .  morphine 4 MG/ML injection 4-8 mg   4-8 mg  Intramuscular  Q3H PRN  Okey Regal Nudelman     4 mg at 02/01/11 0217   .  ondansetron (ZOFRAN) injection 4 mg   4 mg  Intravenous  Once PRN  Warrick Parisian, MD         .  oxyCODONE-acetaminophen [facility]) 5-325 MG per tablet 1-2 tablet   1-2 tablet  Oral  Q4H PRN  Hosie Spangle     1 tablet at 02/01/11 0908   .  pentafluoroprop-tetrafluoroeth (GEBAUERS) aerosol 1 application   1 application  Topical  PRN  Myriam Jacobson, PA         .  promethazine (PHENERGAN) tablet 12.5 mg   12.5 mg  Oral  Q6H PRN  Myriam Jacobson, PA           Or   .  promethazine (PHENERGAN) suppository 25 mg   25 mg  Rectal  BID PRN  Myriam Jacobson, PA         .  sorbitol 70 % solution 30  mL   30 mL  Oral  PRN  Myriam Jacobson, PA         .  white petrolatum (VASELINE) gel                  .  zolpidem (AMBIEN) tablet 5 mg   5 mg  Oral  QHS PRN  Hosie Spangle         .  DISCONTD: 0.45 % sodium chloride infusion     Intravenous  Continuous  Okey Regal Nudelman  30 mL/hr at 01/31/11 0600      .  DISCONTD: 0.9 %  sodium chloride infusion   250 mL  Intravenous   Continuous  Hosie Spangle         .  DISCONTD: 50,000 units bacitracin in 0.9% normal saline 250 mL irrigation       PRN  Hosie Spangle         .  DISCONTD: acetaminophen (TYLENOL) suppository 650 mg   650 mg  Rectal  Q6H PRN  Myriam Jacobson, PA         .  DISCONTD: acetaminophen (TYLENOL) tablet 650 mg   650 mg  Oral  Q6H PRN  Myriam Jacobson, PA         .  DISCONTD: bupivacaine (MARCAINE) 0.5 % injection       PRN  Okey Regal Nudelman     20 mL at 01/30/11 1455   .  DISCONTD: diphenhydrAMINE (BENADRYL) 12.5 MG/5ML elixir 12.5 mg   12.5 mg  Oral  Q6H PRN  Hosie Spangle         .  DISCONTD: diphenhydrAMINE (BENADRYL) injection 12.5 mg   12.5 mg  Intravenous  Q6H PRN  Hosie Spangle         .  DISCONTD: hemostatic agents       PRN  Hosie Spangle     1 application at [id number]   .  DISCONTD: HYDROmorphone (DILAUDID) injection 0.25-0.5 mg   0.25-0.5 mg  Intravenous  Q5 min PRN  Warrick Parisian, MD     0.5 mg at 01/30/11 1942   .  DISCONTD: ketorolac (TORADOL) 30 MG/ML injection 30 mg   30 mg  Intravenous  Once  [employer]         .  DISCONTD: lidocaine-EPINEPHrine (XYLOCAINE W/EPI) 1 %-1:100000 (with pres) injection       PRN  Okey Regal Nudelman     20 mL at 01/30/11 1455   .  DISCONTD: LORazepam (ATIVAN) 2 MG/ML injection                  .  DISCONTD: morphine 1 MG/ML PCA injection     Intravenous  Q4H  Hosie Spangle         .  DISCONTD: naloxone [facility]) injection 0.4 mg   0.4 mg  Intravenous  PRN  Hosie Spangle         .  DISCONTD: ondansetron (ZOFRAN) injection 4 mg   4 mg  Intravenous  Q6H PRN  Hosie Spangle         .  DISCONTD: sodium chloride 0.9 % injection 3 mL   3 mL  Intravenous  Q12H  Hosie Spangle         .  DISCONTD: sodium chloride 0.9 % injection 3 mL   3 mL  Intravenous  PRN  Hosie Spangle         .  DISCONTD: sodium chloride 0.9 % injection 9 mL   9 mL  Intravenous  PRN  Hosie Spangle         .  DISCONTD: sodium chloride  irrigation 0.9 %       PRN  Okey Regal Nudelman     1,000 mL at 01/30/11 1455   .  DISCONTD: sodium chloride irrigation 0.9 %       PRN  Okey Regal Nudelman     1,000 mL at 01/30/11 1739   .  DISCONTD: thrombin spray       PRN  Okey Regal Nudelman     20,000 Units at 01/30/11 1455   .  DISCONTD: zolpidem (AMBIEN) tablet 5 mg   5 mg  Oral  QHS PRN  Myriam Jacobson, PA             Medications Prior to Admission   Medication  Sig  Dispense  Refill   .  hydrOXYzine (ATARAX/VISTARIL) 25 MG tablet  Take 25 mg by mouth every 6 (six) hours as needed.                Home: Home Living Lives With: Alone Receives Help From: Other (Comment) (Daughter and friends to come and stay.) Type of Home: House Home Layout: One level Home Access: Stairs to enter Entrance Stairs-Rails: Right Entrance Stairs-Number of Steps: 2 Home Adaptive Equipment: None    Functional History: Prior Function Level of Independence: Independent with basic ADLs;Independent with homemaking with ambulation;Independent with gait;Independent with transfers Able to Take Stairs?: Yes Driving: Yes   Functional Status:   Mobility: Bed Mobility Bed Mobility: Yes Rolling Left: 4: Min assist Rolling Left Details (indicate cue type and reason): Assist to facilitate rotation of trunk with verbal cues for sequence. Left Sidelying to Sit: 4: Min assist;HOB flat;With rails Left Sidelying to Sit Details (indicate cue type and reason): Assist to translate trunk to midline with cues for sequence. Transfers Transfers: Yes Sit to Stand: 4: Min assist;From bed Sit to Stand Details (indicate cue type and reason): Assist to balance with cues for hand placement and safety. Stand to Sit: 4: Min assist;To chair/3-in-1 Stand to Sit Details: Assist to eccentric control of trunk with cues for safest hand placement. Ambulation/Gait Ambulation/Gait: Yes Ambulation/Gait Assistance: 3: Mod assist Ambulation/Gait Assistance Details (indicate cue  type and reason): Assist for balance secondary to left lean with decreased strength of bilateral LEs (with bilateral knees often buckling especially with fatigue).  Cues to stay inside RW and extend trunk.  Scissoring at feet.  Cues for heel strike throughout distance. Ambulation Distance (Feet): 60 Feet Assistive device: Rolling walker Gait Pattern: Decreased step length - right;Decreased step length - left;Decreased dorsiflexion - right;Decreased dorsiflexion - left;Right steppage;Left steppage;Scissoring;Trunk flexed Stairs: No Wheelchair Mobility Wheelchair Mobility: No   ADL:     Cognition: Cognition Arousal/Alertness: Awake/alert Orientation Level: Oriented X4 Cognition Arousal/Alertness: Awake/alert Overall Cognitive Status: Appears within functional limits for tasks assessed Orientation Level: Oriented X4   BP Readings from Last 3 Encounters:  02/03/11 82/56  02/01/11 90/58  02/01/11 90/58    Blood pressure 90/54, pulse 106, temperature 98.1 F (36.7 C), temperature source Oral, resp. rate 18, height 5\' 4"  (1.626 m), weight 60.2 kg (132 lb 11.5 oz), SpO2 98.00%. Physical Exam  Constitutional: She is oriented to person, place, and time. She appears well-developed.   HENT:   Head: Normocephalic.   Neck: Normal range of motion. Neck supple.   Cardiovascular: Normal rate  and rhythmn  Gr 3/6 SEM loudest at base  No murmur heard.   Abdomen soft, non tender with intact bowel sounds. Pulmonary/Chest: Effort normal. Chest clear,  Neurological: She is alert and oriented to person, place, and time. Left dorsiflexor is 1-2, right dorsiflexor is 0-1.Plantar flexion 4-5 bilat. Proximal legs are grossly 3-4/5 with pain inhibition.  Patient with diminished sensation over the medial leg, foot, and into the heel.  DTR's are 1+ at the knees, absent at the ankles. Cognitively she's alert and appropriate.  CN exam nl. Skin:  Back incision dressed/clean/dry.Dermabond in place without  erythema.  Psychiatric: She has a normal mood and affect      Results for orders placed during the hospital encounter of 01/30/11 (from the past 48 hour(s))   POCT I-STAT 4, (NA,K, GLUC, HGB,HCT)     Status: Normal     Collection Time     01/30/11 12:16 PM       Component  Value  Range  Comment     Sodium  138   135 - 145 (mEq/L)       Potassium  5.0   3.5 - 5.1 (mEq/L)       Glucose, Bld  77   70 - 99 (mg/dL)       HCT  40.0   36.0 - 46.0 (%)       Hemoglobin  13.6   12.0 - 15.0 (g/dL)     BASIC METABOLIC PANEL     Status: Abnormal     Collection Time     01/30/11  8:31 PM       Component  Value  Range  Comment     Sodium  138   135 - 145 (mEq/L)       Potassium  5.9 (*)  3.5 - 5.1 (mEq/L)       Chloride  96   96 - 112 (mEq/L)       CO2  24   19 - 32 (mEq/L)       Glucose, Bld  111 (*)  70 - 99 (mg/dL)       BUN  42 (*)  6 - 23 (mg/dL)       Creatinine, Ser  7.74 (*)  0.50 - 1.10 (mg/dL)       Calcium  7.2 (*)  8.4 - 10.5 (mg/dL)       GFR calc non Af Amer  5 (*)  >90 (mL/min)       GFR calc Af Amer  6 (*)  >90 (mL/min)     CBC     Status: Abnormal     Collection Time     01/30/11  8:31 PM       Component  Value  Range  Comment     WBC  12.2 (*)  4.0 - 10.5 (K/uL)       RBC  4.30   3.87 - 5.11 (MIL/uL)       Hemoglobin  12.5   12.0 - 15.0 (g/dL)       HCT  37.0   36.0 - 46.0 (%)       MCV  86.0   78.0 - 100.0 (fL)       MCH  29.1   26.0 - 34.0 (pg)       MCHC  33.8   30.0 - 36.0 (g/dL)       RDW  14.9   11.5 - 15.5 (%)  Platelets  186   150 - 400 (K/uL)     RENAL FUNCTION PANEL     Status: Abnormal     Collection Time     01/31/11  2:55 AM       Component  Value  Range  Comment     Sodium  141   135 - 145 (mEq/L)       Potassium  4.1   3.5 - 5.1 (mEq/L)  DELTA CHECK NOTED     Chloride  98   96 - 112 (mEq/L)       CO2  30   19 - 32 (mEq/L)       Glucose, Bld  104 (*)  70 - 99 (mg/dL)       BUN  15   6 - 23 (mg/dL)  DELTA CHECK NOTED     Creatinine, Ser  3.94  (*)  0.50 - 1.10 (mg/dL)  DELTA CHECK NOTED     Calcium  7.9 (*)  8.4 - 10.5 (mg/dL)       Phosphorus  2.9   2.3 - 4.6 (mg/dL)       Albumin  3.0 (*)  3.5 - 5.2 (g/dL)       GFR calc non Af Amer  11 (*)  >90 (mL/min)       GFR calc Af Amer  13 (*)  >90 (mL/min)     BASIC METABOLIC PANEL     Status: Abnormal     Collection Time     01/31/11  2:56 AM       Component  Value  Range  Comment     Sodium  140   135 - 145 (mEq/L)       Potassium  4.1   3.5 - 5.1 (mEq/L)  DELTA CHECK NOTED     Chloride  97   96 - 112 (mEq/L)       CO2  31   19 - 32 (mEq/L)       Glucose, Bld  104 (*)  70 - 99 (mg/dL)       BUN  15   6 - 23 (mg/dL)  DELTA CHECK NOTED     Creatinine, Ser  3.93 (*)  0.50 - 1.10 (mg/dL)  DELTA CHECK NOTED     Calcium  8.0 (*)  8.4 - 10.5 (mg/dL)       GFR calc non Af Amer  11 (*)  >90 (mL/min)       GFR calc Af Amer  13 (*)  >90 (mL/min)     CBC     Status: Abnormal     Collection Time     01/31/11  2:56 AM       Component  Value  Range  Comment     WBC  9.4   4.0 - 10.5 (K/uL)       RBC  3.50 (*)  3.87 - 5.11 (MIL/uL)       Hemoglobin  10.6 (*)  12.0 - 15.0 (g/dL)       HCT  30.4 (*)  36.0 - 46.0 (%)       MCV  86.9   78.0 - 100.0 (fL)       MCH  30.3   26.0 - 34.0 (pg)       MCHC  34.9   30.0 - 36.0 (g/dL)       RDW  14.9   11.5 - 15.5 (%)  Platelets  134 (*)  150 - 400 (K/uL)  DELTA CHECK NOTED      Dg Lumbar Spine 2-3 Views   01/30/2011  *RADIOLOGY REPORT*  Clinical Data: 60 year old female undergoing lumbar spine surgery.  LUMBAR SPINE - 2-3 VIEW  Comparison: Intraoperative radiographs from 1450 hours the same day and earlier.  Fluoroscopy time of 0.5 minutes was utilized.  Findings: Intraoperative frontal and lateral fluoroscopic views of the lower lumbar spine demonstrate bilateral transpedicular hardware placement at L4, L5, and S1.  Interbody implant at L4-L5 and L5-S1.  Anterolisthesis of L4 on L5 re-identified.  IMPRESSION: Posterior and interbody fusion  depicted at L4-L5 and L5-S1.  Original Report Authenticated By: Randall An, M.D.    Dg Lumbar Spine 2-3 Views   01/30/2011  *RADIOLOGY REPORT*  Clinical Data: For S1 PLIF  LUMBAR SPINE - 2-3 VIEW  Comparison: 11/07/2010  Findings: Initial film shows a needle at the level of the spinous process of L4.  Second film shows tissue spreaders posteriorly with probes at the L4 and L5 laminar levels.  IMPRESSION: L4 and L5 laminar levels localized.  Original Report Authenticated By: Jules Schick, M.D.    Ct Lumbar Spine Wo Contrast   02/01/2011  *RADIOLOGY REPORT*  Clinical Data: Status post lumbar spinal surgery on 01/30/2011; now cannot lift legs, though was able to ambulate earlier in the day. Evaluate for hematoma.  CT LUMBAR SPINE WITHOUT CONTRAST  Technique:  Multidetector CT imaging of the lumbar spine was performed without intravenous contrast administration. Multiplanar CT image reconstructions were also generated.  Comparison: MRI of the lumbar spine performed 11/07/2010.  Findings: No definite significant focal hematoma is characterized. Scattered foci of air are seen along the lumbar spinal canal, suggesting against significant compression.  At the level of the decompression, significant soft tissue edema is seen; there is no definite evidence of compression of the exiting nerve roots at L4- L5 or L5-S1, though a broad-based disc protrusion is again noted at L4-L5, with persistent grade 1 anterolisthesis at this level and effacement of the foraminal fat.  Soft tissue air is noted tracking along the paraspinal musculature, with underlying edema.  Inflammation and edema are noted within the superficial soft tissues along the back.  Evaluation of the spinal canal at the level of surgery is suboptimal due to metal artifact. The lumbar spinal fusion rods at L4-S1, with associated interbody spacers, are grossly unremarkable in appearance.  There is diffuse calcification along the abdominal aorta and  its branches.  The patient appears status post right-sided nephrectomy; numerous large cysts are noted arising at the left kidney.  Mild scattered diverticulosis is noted along the sigmoid colon.  IMPRESSION:  1.  No definite significant focal hematoma seen. 2.  Lumbar spinal fusion is grossly unremarkable in appearance. 3.  Soft tissue edema noted at the level of decompression; no definite evidence of impression on the exiting nerve roots at L4-L5 or L5-S1, though there is persistent grade 1 anterolisthesis at L4- L5 with a broad-based disc protrusion causing effacement of foraminal fat bilaterally. 4.  Scattered air tracking along the paraspinal musculature and along the lumbar spinal canal. 5.  Diffuse calcification along the abdominal aorta and branches. 6.  Status post right-sided nephrectomy; numerous large cysts arising at the left kidney. 7.  Mild scattered diverticulosis along the sigmoid colon.  Original Report Authenticated By: Santa Lighter, M.D.    Dg C-arm 1-60 Min   01/30/2011  CLINICAL DATA: L4-5, L5-S1 PLIF   C-ARM  1-60 MINUTES  Fluoroscopy was utilized by the requesting physician.  No radiographic  interpretation.         Post Admission Physician Evaluation: Functional deficits secondary  to lumbar stenosis and radiculopathy s/p decompression and fusion with post-operative foot drop. Patient is admitted to receive collaborative, interdisciplinary care between the physiatrist, rehab nursing staff, and therapy team. Patient's level of medical complexity and substantial therapy needs in context of that medical necessity cannot be provided at a lesser intensity of care such as a SNF. Patient has experienced substantial functional loss from his/her baseline which was documented above under the "Functional History" and "Functional Status" headings.  Judging by the patient's diagnosis, physical exam, and functional history, the patient has potential for functional progress which will result  in measurable gains while on inpatient rehab.  These gains will be of substantial and practical use upon discharge  in facilitating mobility and self-care at the household level. Physiatrist will provide 24 hour management of medical needs as well as oversight of the therapy plan/treatment and provide guidance as appropriate regarding the interaction of the two. 24 hour rehab nursing will assist with bladder management, bowel management, safety, skin/wound care, medication administration, pain management and patient education  and help integrate therapy concepts, techniques,education, etc. PT will assess and treat for: balance, endurance, locomotion, strength, transferring and adaptive equipment and orthotic training. Goals are: independent with assistive device. OT will assess and treat for: bathing, bowel/bladder control, locomotion, toileting and transferring .  Goals are: independent with assistive device.   SLP will assess and treat for: not applicable.  Goals are: N/A. Case Management and Social Worker will assess and treat for psychological issues and discharge planning. Team conference will be held weekly to assess progress toward goals and to determine barriers to discharge. Patient will receive at least 3 hours of therapy per day at least 5 days per week. ELOS and Prognosis: 2 weeks excellent     Medical Problem List and Plan: 1.Lumbar stenosis with radiculopathy.Lumbar 4-5 and L5-S1 Laminectomy and fusion 11/14 2. DVT Prophylaxis/Anticoagulation: SCD'S, consider sq lovenox 3. Pain Management:  Will continue Oxy codone IR which  has been helpful for the pain- d/c Neurontin;  Will consider swith back to Norco in am if improved.   4.ESRD-dialysis per [facility].Weigh daily.Follow up labs.Strict i/o;s. Schedule HD post therapy day so as not to interfere with schedule and patient progress. 5.HTN-lopressor.Monitor with increased activity.  She is normotensive today.  Will hold  metoprolol for SBP< 90. 6. Mood:Ego support.  She is in good spirits at present. 7.Acute on chronic anemia:  hgb stable.  Intervention per [facility].         02/01/2011, 9:45 AM

## 2011-02-03 NOTE — Progress Notes (Signed)
Occupational Therapy Session Note  Patient Details  Name: NORALEE CALAFIORE MRN: [id number] Date of Birth: 01-09-1951  Today's Date: 02/03/2011 Time:  - 8:00-8:45    Precautions: Precautions Precautions: Back Required Braces or Orthoses: Yes Spinal Brace: Applied in sitting position Restrictions Weight Bearing Restrictions: No  Short Term Goals: OT Short Term Goal 1: Pt. will engage in bathing at supervision level using AE prn.  OT Short Term Goal 2: Pt. will engage in dressing at supervision level using AE Prn. OT Short Term Goal 3: Pt. will transfer to toilet with supervision level  OT Short Term Goal 4: Pt. will perform toileting at minimal assist level OT Short Term Goal 5: Pt. will transfer to tub/shower at minimal assist level  Skilled Therapeutic Interventions/Progress Updates:    Pt. Sitting edge of bed upon OT arrival.  She said she woke up anxious and did not know where she was.  She engaged in OT ADL retraining edge of bed.  Addressed back precautions, Adaptive equipment usage.  Pt washed upper body but back pain increased and she had to lie down.  During bathing, pt washed legs with pajama bottoms still on.  Pt. Reported she still felt confused from the night and anxious.       Pain Pain Assessment Pain Score: Asleep Back Pain=  8/10 at end of session        Other Treatments    Therapy/Group: Individual Therapy  Lisa Roca 02/03/2011, 8:43 AM

## 2011-02-04 ENCOUNTER — Inpatient Hospital Stay (HOSPITAL_COMMUNITY): Payer: Medicare Other

## 2011-02-04 LAB — RENAL FUNCTION PANEL
Albumin: 2.4 g/dL — ABNORMAL LOW (ref 3.5–5.2)
Calcium: 8.7 mg/dL (ref 8.4–10.5)
Chloride: 89 mEq/L — ABNORMAL LOW (ref 96–112)
Creatinine, Ser: 7.34 mg/dL — ABNORMAL HIGH (ref 0.50–1.10)
GFR calc non Af Amer: 5 mL/min — ABNORMAL LOW (ref >90)
Phosphorus: 3.4 mg/dL (ref 2.3–4.6)

## 2011-02-04 MED ORDER — ENOXAPARIN SODIUM 30 MG/0.3ML ~~LOC~~ SOLN
30.0000 mg | SUBCUTANEOUS | Status: DC
  Administered 2011-02-04 – 2011-02-08 (×4): 30 mg via SUBCUTANEOUS
  Filled 2011-02-04 (×6): qty 0.3

## 2011-02-04 MED ORDER — NEPRO/CARBSTEADY PO LIQD
237.0000 mL | Freq: Two times a day (BID) | ORAL | Status: DC
  Administered 2011-02-04 – 2011-02-05 (×3): 237 mL via ORAL
  Filled 2011-02-04 (×2): qty 237

## 2011-02-04 MED ORDER — HYDROCODONE-ACETAMINOPHEN 5-325 MG PO TABS
ORAL_TABLET | ORAL | Status: AC
  Administered 2011-02-04: 1 via ORAL
  Filled 2011-02-04: qty 1

## 2011-02-04 MED ORDER — POLYETHYLENE GLYCOL 3350 17 G PO PACK
17.0000 g | PACK | Freq: Every day | ORAL | Status: DC
  Administered 2011-02-05 – 2011-02-09 (×5): 17 g via ORAL
  Filled 2011-02-04 (×8): qty 1

## 2011-02-04 MED ORDER — PANTOPRAZOLE SODIUM 40 MG PO TBEC
40.0000 mg | DELAYED_RELEASE_TABLET | Freq: Every day | ORAL | Status: DC
  Administered 2011-02-04 – 2011-02-06 (×3): 40 mg via ORAL
  Filled 2011-02-04 (×3): qty 1

## 2011-02-04 NOTE — Progress Notes (Signed)
Social Work Assessment and Plan Assessment and Plan  Patient Name: [name]  [id number] Date: 02/04/2011  Problem List:  Patient Active Problem List  Diagnoses  . Lumbar stenosis with neurogenic claudication  . Spondylolisthesis of lumbar region  . End stage renal disease on dialysis  . Hypertension  . Unspecified viral hepatitis C without hepatic coma  . Radiculopathy    Past Medical History:  Past Medical History  Diagnosis Date  . Seizures     r/t HTN in 1990's x 1  . Brain aneurysm   . Blood transfusion 1990's    r/t Kidney removal surgery  . Arthritis     Back  . Anemia   . Umbilical hernia age 60  . Heart murmur     Born with heart mumur, does not require follow up per pt  . Hypertension     Does not see a heart doctor, had pre transplant stress test at [facility]    . Diabetes mellitus     Borderline  . End stage renal disease on dialysis     T/Th/Sat dialysis on [employer]  . End stage renal disease on dialysis     01-29-2011  . Hepatitis C     Past Surgical History:  Past Surgical History  Procedure Date  . Nephrectomy 2010    right side done at [facility] on transplant list  . Umbilical hernia repair age 77- 30  . Appendectomy 1960's  . Vascular surgery     right arm dialysis graft  . Tonsillectomy     as a child  . Rib resection     d/t kidney removal  . Back surgery 01/30/2011    laminectomy  . Hernia repair     Discharge Planning  Discharge Planning Living Arrangements: Alone;Family members Support Systems: Family members;Friends/neighbors Assistance Needed: pre arranged, family and friends Do you have any problems obtaining your medications?: No Type of Residence: Private residence [place]: No Patient expects to be discharged to:: home Expected Discharge Date: 02/08/11  Social/Family/Support Systems Social/Family/Support Systems Patient Roles: Parent Contact Information: pt. home # [phone] Anticipated Caregiver:  daughters: Mateo Flow and Elmyra Ricks - both local Anticipated Caregiver's Contact Information: and daughter Elmyra Ricks [phone]  Employment Status Employment Status Employment Status: Disabled Freight forwarder Issues: None Guardian/Conservator: None  Abuse/Neglect Abuse/Neglect Assessment (Assessment to be complete while patient is alone) Physical Abuse: Denies Verbal Abuse: Denies Sexual Abuse: Denies Exploitation of patient/patient's resources: Denies Self-Neglect: Denies  Emotional Status Emotional Status Pt's affect, behavior adn adjustment status: pleasant, oriented woman with daughters both at bedside - denies any emotional distress. No s/s of depression or anxiety therefore BDI screen deferred at this time.  Will monitor Recent Psychosocial Issues: None Pyschiatric History: None  Patient/Family Perceptions, Expectations & Goals Pt/Family Perceptions, Expectations and Goals Pt/Family understanding of illness & functional limitations: pt and daughters with general awareness of medical reasons for back surgery and for CIR therapy Premorbid pt/family roles/activities: independently living alone at home; still driving (drives herself to HD) Anticipated changes in roles/activities/participation: may require 24 hour supervision/ assist initially...hope to eventually regain full independent function Pt/family expectations/goals: see above  [employer]: Other (Comment) (HD @ [place] MWF am) Premorbid Home Care/DME Agencies: None Transportation available at discharge: yes  Discharge Research scientist (life sciences) Resources: Education officer, museum (specify) Nurse, mental health) Financial Resources: SSD Financial Screen Referred: No Living Expenses: Own Money Management: Patient Home Management: patient Patient/Family Preliminary Plans: home with daughters to stay  as needed - providing up to 24 hour per day assist  Clinical  Impression:  Pleasant, oriented woman with two daughters at bedside.  Here after back surgery.  Daughters very supportive and able to provide up to 24 hour per day assist.  Pt with no s/s of any emotional distress - will monitor.      Robie Mcniel, Lorre Nick 02/04/2011

## 2011-02-04 NOTE — Progress Notes (Signed)
Physical Therapy Session Note  Patient Details  Name: [name] MRN: [id number] Date of Birth: 08/02/1950  Today's Date: 02/04/2011 Time 1100-1145 Time calculation : 45 minutes (missed 15 minutes secondary to c/o increased reflux discomfort- meds given) Time: 1300-1350 Time Calculation (min): 50 min   Precautions: Precautions Precautions: Back Required Braces or Orthoses: Yes Spinal Brace: Lumbar corset;Applied in sitting position Restrictions Weight Bearing Restrictions: No  Short Term Goals: PT Short Term Goal 1: Pt min A functional transfers PT Short Term Goal 2: Pt min A gait 50' in controlled environment PT Short Term Goal 3: Pt min A dynamic standing balance for functional task  Skilled Therapeutic Interventions/Progress Updates: Focus of session #1: bilateral LE strengthening/improve tolerance to activity #2: gait training with assistive device on level surfaces controlled environment        General: Pt c/o increasing reflux symptoms/ meds given Pain Session #1- c/o generalized LE pain- meds given Session #2 No complaint of pain  Mobility : Transfers min/mod assist with RW w/c to mat (decreased dorsiflexion bilateral LE); Sit to side to supine (mat)- SBA + vcs for technique (sidelying) with difficulty; Supine to side to sit SBA (mat) ; sit to stand min assist + vcs for hand placement   Locomotion - gait training 41 feet X 2 with Rw min/mod assist (bilateral Ace wraps ankles secondary to decreased dorsiflexion); vcs to turn and sit safely Wheelchair mobility: 120 feet SBA     Balance (Static standing): Pt requires min assist to maintain static standing position with decreased balance reactions     Exercises- Bilateral LE strengthening exercises X 10 as follows: hip flexion, hip abduction, SAQs; Sit to stand X 5 (quad strengthening)     Therapy/Group: Individual Therapy  Melania Kirks,JIM 02/04/2011, 2:55 PM

## 2011-02-04 NOTE — Progress Notes (Signed)
[facility] Individual Statement of Services  Patient Name:  [name]  Date:  02/04/2011  Welcome to the [facility].  Our goal is to provide you with an individualized program based on your diagnosis and situation, designed to meet your specific needs.  With this comprehensive rehabilitation program, you will be expected to participate in at least 3 hours of rehabilitation therapies Monday-Friday, with modified therapy programming on the weekends.  Your rehabilitation program will include the following services:  Physical Therapy (PT), Occupational Therapy (OT), 24 hour per day rehabilitation nursing, Therapeutic Recreaction (TR), Case Management (RN and Education officer, museum), Rehabilitation Medicine, Nutrition Services and Pharmacy Services  Weekly team conferences will be held on  Tuesday  to discuss your progress.  Your RN Case Writer will talk with you frequently to get your input and to update you on team discussions.  Team conferences with you and your family in attendance may also be held.  Depending on your progress and recovery, your program may change.  Your RN Case Engineer, production will coordinate services and will keep you informed of any changes.  Your RN Tourist information centre manager and SW names and contact numbers are listed  below.  The following services may also be recommended but are not provided by the [facility] will be made to provide these services after discharge if needed.  Arrangements include referral to agencies that provide these services.  Your insurance has been verified to be:  Medicare + [employer] [medication coverage also] Your primary doctor is:  [employer] [Dr. Powell]  Pertinent information will be shared with your doctor  and your insurance company.  Case Manager: Lutricia Feil, [facility] [phone]  Social Worker:  Lennart Pall, [place] [phone]  ELOS: 2 weeks      Goals: Modified Independent w/c level;  Supervision w/ ambulation  Information discussed with pt and family and copy given to patient by: Theora Master, 02/04/2011, 11:36 AM

## 2011-02-04 NOTE — Progress Notes (Signed)
Subjective:  Reports right foot numb moreso than left.  Kind of drags her foot. That pill they are giving me for indigestion isn't helping. Objective Vital signs in last 24 hours: Filed Vitals:   02/03/11 1900 02/03/11 2210 02/04/11 0256 02/04/11 0538  BP: 105/49 100/46  90/54  Pulse: 127   121  Temp: 102 F (38.9 C) 100.2 F (37.9 C) 100.6 F (38.1 C) 100.5 F (38.1 C)  TempSrc: Oral Oral  Oral  Resp:    19  Height:      Weight:    56.1 kg (123 lb 10.9 oz)  SpO2:       Weight change: -2.4 kg (-5 lb 4.7 oz)  Intake/Output Summary (Last 24 hours) at 02/04/11 0917 Last data filed at 02/04/11 0735  Gross per 24 hour  Intake   1000 ml  Output      0 ml  Net   1000 ml   Labs: Basic Metabolic Panel:  Lab [id number] 02/01/11 1015 01/31/11 0256 01/31/11 0255  NA 131* 134* 140 --  K 5.1 4.1 4.1 --  CL 89* 91* 97 --  CO2 27 27 31  --  GLUCOSE 83 101* 104* --  BUN 37* 31* 15 --  CREATININE 7.34* 7.64* 3.93* --  CALCIUM 8.7 7.9* 8.0* --  ALB -- -- -- --  PHOS 3.4 3.8 -- 2.9   Liver Function Tests:  Lab 02/04/11 0655 02/01/11 1015 01/31/11 0255  AST -- -- --  ALT -- -- --  ALKPHOS -- -- --  BILITOT -- -- --  PROT -- -- --  ALBUMIN 2.4* 2.9* 3.0*   CBC:  Lab 02/01/11 1015 01/31/11 0256 01/30/11 2031  WBC 6.7 9.4 12.2*  NEUTROABS -- -- --  HGB 9.3* 10.6* 12.5  HCT 28.2* 30.4* 37.0  MCV 87.3 86.9 86.0  PLT 136* 134* 186   Studies/Results: Dg Chest 2 View  02/03/2011  *RADIOLOGY REPORT*  Clinical Data: Fever, end-stage renal disease.  CHEST - 2 VIEW  Comparison: 01/25/2011  Findings: Heart size upper normal limits to mildly enlarged. Aortic arch atherosclerotic calcification.  Surgical clips project over the right lung / hilum.  Elevated left hemidiaphragm.  Linear left lung base opacity may reflect scarring or atelectasis.  Mild interstitial prominence without focal areas of consolidation otherwise.  No pleural effusion or pneumothorax.  No acute osseous  abnormality.  IMPRESSION: Linear left lung base opacities likely reflects scarring or atelectasis.  Interstitial prominence, otherwise without focal areas of consolidation.  Original Report Authenticated By: Suanne Marker, M.D.   Medications:   . calcium carbonate  1-2 tablet Oral TID WC  . darbepoetin (ARANESP) injection - DIALYSIS  200 mcg Intravenous Q Sat-HD  . enoxaparin (LOVENOX) injection  40 mg Subcutaneous Q24H  . polyethylene glycol  17 g Oral Daily  . senna-docusate  2 tablet Oral QHS  . sodium chloride  500 mL Intravenous Once  . DISCONTD: gabapentin  100 mg Oral TID  . DISCONTD: metoprolol tartrate  25 mg Oral BID    I  have reviewed scheduled and prn medications.  Physical Exam: General: Sitting in [place] with back brace; NAD Heart: tachycardia rate ~120 Lungs: CTAB; exam limited by brace Abdomen: soft NT Extremities: no lower extremity edema bilaterally Dialysis Access: right upper AVGG patent  Problem/Plan: 1. Fever - no recent CBC; neg CXR for  Infx; check CBC today 2. ESRD - for HD today on holiday schedule.  (outpt TTS [place]) 3. Anemia - max  ESA; check iron studies 4. Secondary hyperparathyroidism - ok s/p PTHectomy 5. HTN/volume - off BP meds; she is actually above her outpt EDW of 53; she does not look 3 kg above EDW. Get standing BP pre HD. Brace would not ad 3 kg.; tachycardic likely secondary to being of BB and fever. 6. Nutrition - low albumin; increase protein; add supplements routinely 7. S/p lumbar decompression surgery - rehab 8. Reflux sx - add PPI  Myriam Jacobson, PA-C [place] [facility] [phone]  02/04/2011,9:17 AM  LOS: 3 days   I have seen and examined this patient and agree with the plan of care and doing well.  Plan for HD today. Continue with rehab for next 2 weeks.  Ozzy Bohlken A 02/04/2011, 3:12 PM

## 2011-02-04 NOTE — Progress Notes (Addendum)
Subjective/Complaints: Constipation and pain in back.  Unfortunately when she receives pain mediicne nursing is reporting that she is becoming lethargic  Objective: Vital Signs: Blood pressure 90/54, pulse 121, temperature 100.5 F (38.1 C), temperature source Oral, resp. rate 19, height 5\' 5"  (1.651 m), weight 56.1 kg (123 lb 10.9 oz), SpO2 97.00%. Dg Chest 2 View  02/03/2011  *RADIOLOGY REPORT*  Clinical Data: Fever, end-stage renal disease.  CHEST - 2 VIEW  Comparison: 01/25/2011  Findings: Heart size upper normal limits to mildly enlarged. Aortic arch atherosclerotic calcification.  Surgical clips project over the right lung / hilum.  Elevated left hemidiaphragm.  Linear left lung base opacity may reflect scarring or atelectasis.  Mild interstitial prominence without focal areas of consolidation otherwise.  No pleural effusion or pneumothorax.  No acute osseous abnormality.  IMPRESSION: Linear left lung base opacities likely reflects scarring or atelectasis.  Interstitial prominence, otherwise without focal areas of consolidation.  Original Report Authenticated By: Suanne Marker, M.D.    Basename 02/01/11 1015  WBC 6.7  HGB 9.3*  HCT 28.2*  PLT 136*    Basename 02/01/11 1015  NA 134*  K 4.1  CL 91*  CO2 27  GLUCOSE 101*  BUN 31*  CREATININE 7.64*  CALCIUM 7.9*   CBG (last 3)  No results found for this basename: GLUCAP:3 in the last 72 hours  Wt Readings from Last 3 Encounters:  02/04/11 56.1 kg (123 lb 10.9 oz)  01/31/11 60.2 kg (132 lb 11.5 oz)  01/31/11 60.2 kg (132 lb 11.5 oz)    Physical Exam:  General appearance: alert Head: Normocephalic, without obvious abnormality, atraumatic Eyes: conjunctivae/corneas clear. PERRL, EOM's intact. Fundi benign. Ears: normal TM's and external ear canals both ears Nose: Nares normal. Septum midline. Mucosa normal. No drainage or sinus tenderness. Throat: lips, mucosa, and tongue normal; teeth and gums normal Neck: no  adenopathy, no carotid bruit, no JVD, supple, symmetrical, trachea midline and thyroid not enlarged, symmetric, no tenderness/mass/nodules Back: symmetric, no curvature. ROM normal. No CVA tenderness. Resp: clear to auscultation bilaterally Cardio: regular rate and rhythm, S1, S2 normal, no murmur, click, rub or gallop GI: soft, non-tender; bowel sounds normal; no masses,  no organomegaly Extremities: extremities normal, atraumatic, no cyanosis or edema Pulses: 2+ and symmetric Skin: Skin color, texture, turgor normal. No rashes or lesions Neurologic: bilateral foot drop and sensory loss in the L4 and L5 dermatomes Incision/Wound: clean and intact.   Assessment/Plan: 1. Functional deficits secondary to lumbar stenosis and radiculopathy s/p lami and fusion which require 3+ hours per day of interdisciplinary therapy in a comprehensive inpatient rehab setting. Physiatrist is providing close team supervision and 24 hour management of active medical problems listed below. Physiatrist and rehab team continue to assess barriers to discharge/monitor patient progress toward functional and medical goals.  Completed IPOC today. Needs PRAFO's for bilateral LE's  Mobility: Bed Mobility Rolling Left: 4: Min assist Left Sidelying to Sit: 4: Min assist Left Sidelying to Sit Details (indicate cue type and reason): educated on log roll for back precautions Transfers Transfers: Yes Sit to Stand: 2: Max assist Stand Pivot Transfers: 2: Max Teacher, English as a foreign language Transfers: 4: Min assist Ambulation/Gait Ambulation/Gait Assistance: 3: Mod assist Ambulation/Gait Assistance Details (indicate cue type and reason): pt unable to DF bilateral feet, walks with trunk flexed, heavy wt on UE's unable to extend B knees fully, needs assist to prevent buckling B knees, scissoring Ambulation Distance (Feet): 15 Feet Assistive device: Rolling walker Wheelchair Mobility Wheelchair Mobility:  Yes Wheelchair Assistance: 4:  Min Lexicographer: Both upper extremities Distance: 70 ADL:   Cognition: Cognition Overall Cognitive Status: Appears within functional limits for tasks assessed Orientation Level: Oriented to person;Oriented to place;Oriented to time;Disoriented to situation Cognition Orientation Level: Oriented to person;Oriented to place;Oriented to time;Disoriented to situation  Medical Problem List and Plan:  1.Lumbar stenosis with radiculopathy.Lumbar 4-5 and L5-S1 Laminectomy and fusion 11/14  2. DVT Prophylaxis/Anticoagulation: SCD'S, consider sq lovenox   3. Pain Management: Will continue with hydrocodone.  Neurontin stopped; Pain a little better today.  Explained to the patient that we need to balance pain relief with her neuro/arousal status.  4.ESRD-dialysis per [facility].Weigh daily.Follow up labs.Strict i/o;s. Schedule HD post therapy day so as not to interfere with schedule and patient progress.   5.HTN-lopressor.Monitor with increased activity. She is normotensive today. Will hold metoprolol for SBP< 90.   6. Mood:Ego support. She is in good spirits at present.   7.Acute on chronic anemia: hgb stable. Intervention per [facility].   8. Constipation:  Augment bowel regimen.      [name] T 02/04/2011, 7:48 AM

## 2011-02-04 NOTE — Progress Notes (Signed)
Pt temp 100.5. X-ray results received and called back to Dr. Kennieth Rad with no new orders. Serita Grit Conception

## 2011-02-04 NOTE — Progress Notes (Addendum)
Occupational Therapy Session Note  Patient Details  Name: [name] MRN: [id number] Date of Birth: Jan 22, 1951  Today's Date: 02/04/2011 Time: [id number] Time Calculation (min): 61 min  Precautions: Precautions Precautions: Back Required Braces or Orthoses: Yes Spinal Brace: Lumbar corset;Applied in sitting position Restrictions Weight Bearing Restrictions: No  Short Term Goals: OT Short Term Goal 1: Pt. will engage in bathing at supervision level using AE prn.  OT Short Term Goal 2: Pt. will engage in dressing at supervision level using AE Prn. OT Short Term Goal 3: Pt. will transfer to toilet with supervision level  OT Short Term Goal 4: Pt. will perform toileting at minimal assist level OT Short Term Goal 5: Pt. will transfer to tub/shower at minimal assist level  Skilled Therapeutic Interventions/Progress Updates: Pt seen for ADL retraining for bathing/ dressing shower level with a focus on dynamic standing, sit to stand, adhering to back precautions, use of DME.  Pt was very energetic this morning with less pain.  She used a walker to ambulate from bed to shower with mod assist as pt needed assist to advance feet and with foot placement.  Doffed brace for shower in sitting.  Used stand pivot for tubbench transfer to wheelchair, but relied heavily on grab bars.  Overall pt did extremely well by completing ADLs with close supervision except for steady assist in standing with clothing management and min assist to don lumbar corset.  Pt crossed legs to don pants, socks, shoes over feet instead of using AE.  Pt has tub at home and will need tub bench transfer training and will need a tub bench.  Pt was very excited with her accomplishments this morning.     General General Chart Reviewed: Yes    Pain Pain Assessment Pain Assessment: 0-10 Pain Score:   4 Pain Type: Chronic pain Pain Location: Leg Pain Onset: On-going Patients Stated Pain Goal: 2 Pain Intervention(s):  Shower ADL ADL Eating: Independent Grooming: Modified independent Where Assessed-Grooming: Sitting at sink Upper Body Bathing: Modified independent Where Assessed-Upper Body Bathing: Shower Lower Body Bathing: Supervision/safety Where Assessed-Lower Body Bathing: Shower (lateral leans to wash bottom) Upper Body Dressing: Setup Where Assessed-Upper Body Dressing: Other (Comment) (on tub bench ) Lower Body Dressing: Contact guard Where Assessed-Lower Body Dressing: Wheelchair Toileting: Not assessed Toilet Transfer: Not assessed Social research officer, government: Minimal assistance;Moderate assistance (mod into shower with walker, min out stand piviot) Social research officer, government Method: Stand pivot;Ambulating Youth worker: Radio broadcast assistant      Therapy/Group: Individual Therapy  [place] 02/04/2011, 10:07 AM

## 2011-02-04 NOTE — Progress Notes (Signed)
Patient information reviewed and entered into [employer] system by Cleotilde Spadaccini, RN, CRRN, PPS Coordinator.  Information including medical coding and functional independence measure will be reviewed and updated through discharge.     Per nursing patient was given "Data Collection Information Summary for Patients in Inpatient Rehabilitation Facilities with attached "Privacy Act Statement-Health Care Records" upon admission.   

## 2011-02-04 NOTE — Progress Notes (Signed)
Per State Regulation 482.30 This chart was reviewed for medical necessity with respect to the patient's Admission/Duration of stay.  Pt participating txs--limited by pain, LE weakness/poor coordination, fatigue, indigestion.  Currently min-max assist w/ need for cues.  LTG=Mod I w/c level; supervision gait   ELOS=about 2 weeks.  Theora Master                 Nurse Care Manager              Next Review Date: 02/08/11

## 2011-02-04 NOTE — Progress Notes (Signed)
Pt alert and oriented x3 today, no unsafe behaviors. Transfers min to mod assist stand pivot with rolling walker. Wears corset OOB and PRAFOs bilateral lower extremities in bed. Pt on back precautions. Hemodialysis TRSat, to be done today due to holiday, pt does not void. Right upper extremity graft +/+. Pt complains of bilateral leg pain, 1 Norco given and pt reported relief. Back incision OTA with dermabond clean dry intact. Continue with plan of care.

## 2011-02-04 NOTE — Progress Notes (Signed)
Pt admitted to room 4011 from unit 3000. Admission information given to patient, safety video shown, assessment completed. Pt oriented to room and rehab info provided.

## 2011-02-05 MED ORDER — HYDROCODONE-ACETAMINOPHEN 5-325 MG PO TABS
1.0000 | ORAL_TABLET | Freq: Two times a day (BID) | ORAL | Status: DC
  Administered 2011-02-05: 1 via ORAL

## 2011-02-05 MED ORDER — HYDROMORPHONE HCL 2 MG PO TABS
2.0000 mg | ORAL_TABLET | ORAL | Status: DC | PRN
  Administered 2011-02-05: 2 mg via ORAL
  Filled 2011-02-05: qty 1

## 2011-02-05 MED ORDER — WHITE PETROLATUM GEL
Status: AC
  Filled 2011-02-05: qty 5

## 2011-02-05 MED ORDER — HEPARIN SODIUM (PORCINE) 1000 UNIT/ML DIALYSIS
20.0000 [IU]/kg | INTRAMUSCULAR | Status: DC | PRN
  Administered 2011-02-06: 1200 [IU] via INTRAVENOUS_CENTRAL
  Filled 2011-02-05: qty 2

## 2011-02-05 NOTE — Progress Notes (Signed)
Alert and orientated x 3. Corsett brace on when out of bed. Hemodialysis pt. Anuria. Graft to RUE, positive for bruit and thrill. Remains continent of bowel. Last bowel movement 02/05/11. Poor appetite and poor pain control. Pt reports minimum relief from Vicodin. Lumbar incision with dermabond, OTA, unremarkable. Pt participating in therapy.

## 2011-02-05 NOTE — Progress Notes (Signed)
Physical Therapy Session Note  Patient Details  Name: [name] MRN: [id number] Date of Birth: 11-10-1950  Today's Date: 02/05/2011 Time:  -     Precautions: Precautions Precautions: Back Required Braces or Orthoses: Yes Spinal Brace: Lumbar corset;Applied in sitting position Restrictions Weight Bearing Restrictions: No  Short Term Goals: PT Short Term Goal 1: Pt min A functional transfers PT Short Term Goal 2: Pt min A gait 50' in controlled environment PT Short Term Goal 3: Pt min A dynamic standing balance for functional task  Skilled Therapeutic Interventions/Progress Updates:      General   Vital Signs Therapy Vitals Pulse Rate: 115  (after gait) Patient Position, if appropriate: Sitting Oxygen Therapy SpO2: 94 % O2 Device: None (Room air) Pain Pain Assessment Pain Assessment: 0-10 Pain Score:   8 Pain Type: Surgical pain Pain Location: Back Pain Orientation: Posterior Pain Descriptors: Sore Pain Onset: With Activity Mobility Bed Mobility Bed Mobility: Yes Rolling Left: 5: Supervision;With rail Rolling Left Details: Verbal cues for technique Left Sidelying to Sit: 5: Supervision;With rails Transfers Transfers: Yes Sit to Stand: 3: Mod assist;With upper extremity assist;From bed Sit to Stand Details: Tactile cues for placement;Verbal cues for technique Sit to Stand Details (indicate cue type and reason): vc and assist with foot placement Stand to Sit: 3: Mod assist;With armrests Stand to Sit Details (indicate cue type and reason): Verbal cues for precautions/safety Stand to Sit Details: vc for hand placement Stand Pivot Transfers: 3: Mod assist (rw) Stand Pivot Transfer Details: Verbal cues for precautions/safety Stand Pivot Transfer Details (indicate cue type and reason): vc for walker placement and hip extension Locomotion  Ambulation Ambulation: Yes Ambulation/Gait Assistance: 3: Mod assist Ambulation Distance (Feet): 15 Feet (5' x  1) Assistive device: Rolling walker Ambulation/Gait Assistance Details: Verbal cues for technique Ambulation/Gait Assistance Details (indicate cue type and reason): ACE wraps used for dorsiflexion. VC needed for hip extension , foot placement, and walker position especially with turning Gait Gait: Yes Gait Pattern: Decreased dorsiflexion - right;Decreased dorsiflexion - left;Scissoring;Trunk flexed (left knee hyperextension) Wheelchair Mobility Wheelchair Mobility: Yes Wheelchair Assistance: 5: Supervision Wheelchair Propulsion: Both upper extremities Wheelchair Parts Management: Needs assistance  Trunk/Postural Assessment     Balance     Exercises    Other Treatments    Therapy/Group: Individual Therapy  Erle Crocker 02/05/2011, 10:19 AM

## 2011-02-05 NOTE — Progress Notes (Deleted)
BP 136.

## 2011-02-05 NOTE — Progress Notes (Signed)
Occupational Therapy Session Note  Patient Details  Name: [name] MRN: [id number] Date of Birth: 1950-09-18  Today's Date: 02/05/2011 Time: [id number] Time Calculation (min): 30 min  Precautions: Precautions Precautions: Back Required Braces or Orthoses: Yes Spinal Brace: Lumbar corset;Applied in sitting position Restrictions Weight Bearing Restrictions: No  Short Term Goals: OT Short Term Goal 1: Pt. will engage in bathing at supervision level using AE prn.  OT Short Term Goal 2: Pt. will engage in dressing at supervision level using AE Prn. OT Short Term Goal 3: Pt. will transfer to toilet with supervision level  OT Short Term Goal 4: Pt. will perform toileting at minimal assist level OT Short Term Goal 5: Pt. will transfer to tub/shower at minimal assist level  Skilled Therapeutic Interventions/Progress Updates:  Skilled intervention focusing on bed mobility, donning bilateral shoes, stand pivot transfer from edge of bed to w/c. Patient then propelled w/c from room to ADL apartment for simulated tub/shower transfer onto tub bench with close supervision and set-up assistance. Encouraged patient to stay up in w/c for awhile. Gave snack, NT stated patient hasn't eaten anything all day.   Pain No complaints of pain  Therapy/Group: Individual Therapy  Sante Biedermann 02/05/2011, 2:48 PM

## 2011-02-05 NOTE — Progress Notes (Signed)
Patient ID: [name], female   DOB: Dec 24, 1950, 60 y.o.   MRN: [id number] Subjective:  Still c/o back pain that is not being helped with medications Objective Vital signs in last 24 hours: Filed Vitals:   02/05/11 0127 02/05/11 0538 02/05/11 0603 02/05/11 1010  BP:  81/45 78/54   Pulse: 136 117  115  Temp:  99.3 F (37.4 C)    TempSrc:  Oral    Resp:  20    Height:      Weight:  57.5 kg (126 lb 12.2 oz)    SpO2:  94%  94%   Weight change: 2.4 kg (5 lb 4.7 oz)  Intake/Output Summary (Last 24 hours) at 02/05/11 1224 Last data filed at 02/05/11 0815  Gross per 24 hour  Intake    240 ml  Output   1907 ml  Net  -1667 ml   Labs: Basic Metabolic Panel:  Lab [id number] 0655 02/01/11 1015 01/31/11 0256 01/31/11 0255  NA 131* 134* 140 --  K 5.1 4.1 4.1 --  CL 89* 91* 97 --  CO2 27 27 31  --  GLUCOSE 83 101* 104* --  BUN 37* 31* 15 --  CREATININE 7.34* 7.64* 3.93* --  CALCIUM 8.7 7.9* 8.0* --  ALB -- -- -- --  PHOS 3.4 3.8 -- 2.9   Liver Function Tests:  Lab 02/04/11 0655 02/01/11 1015 01/31/11 0255  AST -- -- --  ALT -- -- --  ALKPHOS -- -- --  BILITOT -- -- --  PROT -- -- --  ALBUMIN 2.4* 2.9* 3.0*   CBC:  Lab 02/01/11 1015 01/31/11 0256 01/30/11 2031  WBC 6.7 9.4 12.2*  NEUTROABS -- -- --  HGB 9.3* 10.6* 12.5  HCT 28.2* 30.4* 37.0  MCV 87.3 86.9 86.0  PLT 136* 134* 186   Studies/Results: Dg Chest 2 View  02/03/2011  *RADIOLOGY REPORT*  Clinical Data: Fever, end-stage renal disease.  CHEST - 2 VIEW  Comparison: 01/25/2011  Findings: Heart size upper normal limits to mildly enlarged. Aortic arch atherosclerotic calcification.  Surgical clips project over the right lung / hilum.  Elevated left hemidiaphragm.  Linear left lung base opacity may reflect scarring or atelectasis.  Mild interstitial prominence without focal areas of consolidation otherwise.  No pleural effusion or pneumothorax.  No acute osseous abnormality.  IMPRESSION: Linear left lung base  opacities likely reflects scarring or atelectasis.  Interstitial prominence, otherwise without focal areas of consolidation.  Original Report Authenticated By: Suanne Marker, M.D.   Medications:   . calcium carbonate  1-2 tablet Oral TID WC  . darbepoetin (ARANESP) injection - DIALYSIS  200 mcg Intravenous Q Sat-HD  . enoxaparin (LOVENOX) injection  40 mg Subcutaneous Q24H  . polyethylene glycol  17 g Oral Daily  . senna-docusate  2 tablet Oral QHS  . sodium chloride  500 mL Intravenous Once  . DISCONTD: gabapentin  100 mg Oral TID  . DISCONTD: metoprolol tartrate  25 mg Oral BID    I  have reviewed scheduled and prn medications.  Physical Exam: General: Sitting in [place] with back brace; NAD Heart: tachycardia rate ~120 Lungs: CTAB; exam limited by brace Abdomen: soft NT Extremities: no lower extremity edema bilaterally Dialysis Access: right upper AVGG patent  Problem/Plan: 1. Fever - no recent CBC; neg CXR for  Infx; check CBC today 2. ESRD - for HD MWF while on holiday schedule.  (outpt TTS [place]) 3. Anemia - max ESA; check iron studies 4. Secondary hyperparathyroidism -  ok s/p PTHectomy 5. HTN/volume - off BP meds; she is actually above her outpt EDW of 53; she does not look 3 kg above EDW. Get standing BP pre HD. Brace would not ad 3 kg.; tachycardic likely secondary to being of BB and fever. 6. Nutrition - low albumin; increase protein; add supplements routinely 7. S/p lumbar decompression surgery - rehab.  Will try dilaudid for pain and d/c narco 8. Reflux sx - add PPI   I have seen and examined this patient and agree with the plan of care and doing well.  Plan for HD today. Continue with rehab for next 2 weeks.  Shyheim Tanney A 02/05/2011, 12:24 PM

## 2011-02-05 NOTE — Progress Notes (Signed)
Silvestre Mesi, PA notified of patient's BP 78/54.  No new orders received.

## 2011-02-05 NOTE — Progress Notes (Signed)
Patient returned to unit from HD via hospital bed.  RUE graft with positive bruit, positive thrill.  No active bleeding noted.  BP 99/66, Temp 98.4, respirations 20, HR 142.  Patient is alert, oriented X 3.

## 2011-02-05 NOTE — Progress Notes (Signed)
HR 136.

## 2011-02-05 NOTE — Progress Notes (Signed)
Physical Therapy Note  Patient Details  Name: MATRICE STANSBERRY MRN: [id number] Date of Birth: Sep 05, 1950 Today's Date: 02/05/2011  Time in/out: 1033-1100 Pain 8/10 bil LE's, pt premedicated Individual tx  Pt required encouragement to participate in PT today, but agreeable to bedside tx.  Stand-pivot transfer with Mod A for lifting and steadying. Cues for hand placement on armrest as pt tried to pull up on therapist. Uncontrolled decent to bed. Seated there ex: LAQ x15 bil. Sit>supine  Mod A for bil LE lifting and cues for technique logrolling. Supine there ex: heel slides, GS, QS, hip ABD/ADD all bil x10. Supine>sit S with cues for logrolling as pt attempted to sit without rolling. Sit>stand Min A with cues for initiation. Static standing to increase activity tolerance x60 sec with cues for upright posture and hand placement on grips with CGA. Stand-step transfer with Mod A for steadying due to pt shuffling feet to turn. Cues for hand placement and Min A to sit.  Pt left in WC with call bell.     [employer] 02/05/2011, 10:45 AM

## 2011-02-05 NOTE — Progress Notes (Signed)
Patient ID: [name], female   DOB: 1950/11/23, 60 y.o.   MRN: [id number]  Subjective/Complaints: Constipation and pain in back.  Unfortunately when she receives pain mediicne nursing is reporting that she is becoming lethargic.  Was able to make it through yesterday but was quite "sore"  Objective: Vital Signs: Blood pressure 78/54, pulse 117, temperature 99.3 F (37.4 C), temperature source Oral, resp. rate 20, height 5\' 5"  (1.651 m), weight 57.5 kg (126 lb 12.2 oz), SpO2 94.00%. Dg Chest 2 View  02/03/2011  *RADIOLOGY REPORT*  Clinical Data: Fever, end-stage renal disease.  CHEST - 2 VIEW  Comparison: 01/25/2011  Findings: Heart size upper normal limits to mildly enlarged. Aortic arch atherosclerotic calcification.  Surgical clips project over the right lung / hilum.  Elevated left hemidiaphragm.  Linear left lung base opacity may reflect scarring or atelectasis.  Mild interstitial prominence without focal areas of consolidation otherwise.  No pleural effusion or pneumothorax.  No acute osseous abnormality.  IMPRESSION: Linear left lung base opacities likely reflects scarring or atelectasis.  Interstitial prominence, otherwise without focal areas of consolidation.  Original Report Authenticated By: Suanne Marker, M.D.   No results found for this basename: WBC:2,HGB:2,HCT:2,PLT:2 in the last 72 hours  Basename 02/04/11 0655  NA 131*  K 5.1  CL 89*  CO2 27  GLUCOSE 83  BUN 37*  CREATININE 7.34*  CALCIUM 8.7   CBG (last 3)  No results found for this basename: GLUCAP:3 in the last 72 hours  Wt Readings from Last 3 Encounters:  02/05/11 57.5 kg (126 lb 12.2 oz)  01/31/11 60.2 kg (132 lb 11.5 oz)  01/31/11 60.2 kg (132 lb 11.5 oz)    Physical Exam:  General appearance: alert Head: Normocephalic, without obvious abnormality, atraumatic Eyes: conjunctivae/corneas clear. PERRL, EOM's intact. Fundi benign. Ears: normal TM's and external ear canals both ears Nose: Nares  normal. Septum midline. Mucosa normal. No drainage or sinus tenderness. Throat: lips, mucosa, and tongue normal; teeth and gums normal Neck: no adenopathy, no carotid bruit, no JVD, supple, symmetrical, trachea midline and thyroid not enlarged, symmetric, no tenderness/mass/nodules Back: symmetric, no curvature. ROM normal. No CVA tenderness. Resp: clear to auscultation bilaterally Cardio: regular rate and rhythm, S1, S2 normal, no murmur, click, rub or gallop GI: soft, non-tender; bowel sounds normal; no masses,  no organomegaly Extremities: extremities normal, atraumatic, no cyanosis or edema Pulses: 2+ and symmetric Skin: Skin color, texture, turgor normal. No rashes or lesions Neurologic: bilateral foot drop and sensory loss in the L4 and L5 dermatomes Incision/Wound: clean and intact without drainage.   Assessment/Plan: 1. Functional deficits secondary to lumbar stenosis and radiculopathy s/p lami and fusion which require 3+ hours per day of interdisciplinary therapy in a comprehensive inpatient rehab setting. Physiatrist is providing close team supervision and 24 hour management of active medical problems listed below. Physiatrist and rehab team continue to assess barriers to discharge/monitor patient progress toward functional and medical goals.  Complete IPOC today as it couldn't be completed yesterday.  PRAFO's for bilateral LE's delivered but not worn!  Mobility: Bed Mobility Rolling Left: 4: Min assist Left Sidelying to Sit: 4: Min assist Left Sidelying to Sit Details (indicate cue type and reason): educated on log roll for back precautions Transfers Transfers: Yes Sit to Stand: 2: Max assist Stand Pivot Transfers: 2: Max Teacher, English as a foreign language Transfers: 4: Min assist Ambulation/Gait Ambulation/Gait Assistance: 4: Min assist;3: Mod assist Ambulation/Gait Assistance Details (indicate cue type and reason): pt unable to [facility]  bilateral feet, walks with trunk flexed, heavy wt on  UE's unable to extend B knees fully, needs assist to prevent buckling B knees, scissoring Ambulation Distance (Feet): 15 Feet Assistive device: Rolling walker Wheelchair Mobility Wheelchair Mobility: Yes Wheelchair Assistance: 4: Min Lexicographer: Both upper extremities Distance: 70 ADL:   Cognition: Cognition Overall Cognitive Status: Appears within functional limits for tasks assessed Arousal/Alertness: Awake/alert Orientation Level: Oriented X4 Cognition Arousal/Alertness: Awake/alert Orientation Level: Oriented X4  Medical Problem List and Plan:  1.Lumbar stenosis with radiculopathy.Lumbar 4-5 and L5-S1 Laminectomy and fusion 11/14  2. DVT Prophylaxis/Anticoagulation: SCD'S, consider sq lovenox   3. Pain Management: Will continue with hydrocodone.  Neurontin stopped; Pain a little better today.  Explained to the patient that we need to balance pain relief with her neuro/arousal status. Will try scheduled norco before therapies.  4.ESRD-dialysis per [facility].Weigh daily.Follow up labs.Strict i/o;s. Schedule HD post therapy day so as not to interfere with schedule and patient progress.   5.HTN-lopressor.Monitor with increased activity. She is normotensive today. Will hold metoprolol for SBP< 90.   6. Mood:Ego support. She is in good spirits at present.   7.Acute on chronic anemia: hgb stable. Intervention per [facility].   8. Constipation:  Augment bowel regimen.  9. Fever: w/u negative.  Temps are down.  Wound reinspected today and looks good.      SWARTZ,ZACHARY T 02/05/2011, 7:57 AM

## 2011-02-05 NOTE — Progress Notes (Signed)
Occupational Therapy Note  Patient Details  Name: [name] MRN: [id number] Date of Birth: [birth year] Today's Date: 02/05/2011  Pt missed 60 minutes skilled OT services secondary to increased pain after previous therapy session.  Pt stated that nursing had just given her some pain medication.  Therapist encouraged pt to participate but pt insistent she could not do any more therapy at this time.   Leotis Shames [facility] 02/05/2011, 11:13 AM

## 2011-02-05 NOTE — Patient Care Conference (Signed)
Inpatient RehabilitationTeam Conference Note Date: 02/05/2011   Time: 5:40 PM    Patient Name: [name]      Medical Record Number: [id number]  Date of Birth: November 02, 1950 Sex: Female         Room/Bed: 4011/4011-01 Payor Info: Payor: MEDICARE  Plan: MEDICARE PART A AND B  Product Type: *No Product type*     Admitting Diagnosis: LUMBAR FUSION  Admit Date/Time:  02/01/2011  2:35 PM Admission Comments: No comment available   Primary Diagnosis:  Radiculopathy Principal Problem: Radiculopathy  Patient Active Problem List  Diagnoses Date Noted  . Radiculopathy 02/01/2011  . Lumbar stenosis with neurogenic claudication 01/30/2011  . Spondylolisthesis of lumbar region 01/30/2011  . End stage renal disease on dialysis 01/30/2011  . Hypertension 01/30/2011  . Unspecified viral hepatitis C without hepatic coma 01/30/2011    Expected Discharge Date: Expected Discharge Date: 02/15/11  Team Members Present: Physician: Dr. Alger Simons Case Manager Present: Lutricia Feil, RN Social Worker Present: Lennart Pall, LCSW PT Present: Canary Brim, Toma Copier, PTA OT Present: Antony Salmon, OT;Patricia Lissa Hoard, OT Other (Discipline and Name): Daiva Nakayama, PPS Coordinator RN Present: Janyth Pupa    Current Status/Progress Goal Weekly Team Focus  Medical   Patient with ongoing pain problems especially in her low back. Is unable to tolerate narcotics due to her sedating effects. Did utilize low-dose Norco both when necessary and scheduled. Patient persistent foot drop  Improved pain tolerance and activity tolerance. Consider bilateral AFOs.  Balance pain management with mental status.   Bowel/Bladder   continent bowel and bladder, lbm 11/18  mod independent with bowel/bladder maintenance  remain continent   Swallow/Nutrition/ Hydration             ADL's   overall min A for ADLs; mod A for ADL transfers  overall mod I; supervision for transfers  ADL retraining, pain management, increasing  activity tolerance/overall endurance   Mobility   mod assist gait 15' with RW, supervision WC mobility  MOd. I wc and transfers, gait supervision, 1 step min assist  LE strength and balance   Communication             Safety/Cognition/ Behavioral Observations            Pain   complaints of neuropathic pain in bilateral lower extremities  pain < 3   monitor and manage pain with prn pain medications   Skin   lumbar incision with dermabond  no new breakdown  educate patient on skin care and incision management      *See Interdisciplinary Assessment and Plan and progress notes for long and short-term goals  Barriers to Discharge: Pain and lower extremity motor deficits.    Possible Resolutions to Barriers:  AFOs and analgesia    Discharge Planning/Teaching Needs:  Home with family available to provide 24 hour per day assist if needed      Team Discussion:  Mod I w/c goals  Pt w/ weak hips & foot drop--supervision goal for gait.  Revisions to Treatment Plan: none    Continued Need for Acute Rehabilitation Level of Care: The patient requires daily medical management by a physician with specialized training in physical medicine and rehabilitation for the following conditions: Daily direction of a multidisciplinary physical rehabilitation program to ensure safe treatment while eliciting the highest outcome that is of practical value to the patient.: Yes Daily medical management of patient stability for increased activity during participation in an intensive rehabilitation regime.: Yes Daily analysis  of laboratory values and/or radiology reports with any subsequent need for medication adjustment of medical intervention for : Post surgical problems;Neurological problems  Discussed team conference w/ pt & daughters:  ELOS: 02/15/11  Goals:  Mod I w/c level; supervision gait & need for family ed before d/c.  Daughters to decide best time for them to come in for education & call to [facility].    Theora Master 02/05/2011, 5:40 PM

## 2011-02-06 ENCOUNTER — Inpatient Hospital Stay (HOSPITAL_COMMUNITY): Payer: Medicare Other

## 2011-02-06 DIAGNOSIS — IMO0002 Reserved for concepts with insufficient information to code with codable children: Secondary | ICD-10-CM

## 2011-02-06 DIAGNOSIS — Q762 Congenital spondylolisthesis: Secondary | ICD-10-CM

## 2011-02-06 DIAGNOSIS — N186 End stage renal disease: Secondary | ICD-10-CM

## 2011-02-06 LAB — CBC
MCHC: 34.7 g/dL (ref 30.0–36.0)
Platelets: 301 10*3/uL (ref 150–400)
RDW: 15.1 % (ref 11.5–15.5)

## 2011-02-06 LAB — PREPARE RBC (CROSSMATCH)

## 2011-02-06 MED ORDER — VANCOMYCIN HCL 500 MG IV SOLR: 500.0000 mg | INTRAVENOUS | Status: DC

## 2011-02-06 MED ORDER — HYDROCODONE-ACETAMINOPHEN 5-325 MG PO TABS
ORAL_TABLET | ORAL | Status: AC
  Filled 2011-02-06: qty 1

## 2011-02-06 MED ORDER — ALPRAZOLAM 0.25 MG PO TABS
0.1250 mg | ORAL_TABLET | Freq: Three times a day (TID) | ORAL | Status: DC | PRN
  Administered 2011-02-06: 0.125 mg via ORAL
  Administered 2011-02-07: 12:00:00 via ORAL
  Administered 2011-02-08: 0.125 mg via ORAL
  Filled 2011-02-06 (×3): qty 1

## 2011-02-06 MED ORDER — VANCOMYCIN HCL IN DEXTROSE 1-5 GM/200ML-% IV SOLN
1000.0000 mg | INTRAVENOUS | Status: AC
  Administered 2011-02-06: 1000 mg via INTRAVENOUS
  Filled 2011-02-06: qty 200

## 2011-02-06 MED ORDER — DEXTROSE 5 % IV SOLN
2.0000 g | INTRAVENOUS | Status: AC
  Administered 2011-02-06: 2 g via INTRAVENOUS
  Filled 2011-02-06: qty 2

## 2011-02-06 MED ORDER — DEXTROSE 5 % IV SOLN: 2.0000 g | INTRAVENOUS | Status: DC

## 2011-02-06 NOTE — Progress Notes (Signed)
Patient ID: [name], female   DOB: May 22, 1950, 60 y.o.   MRN: [id number] Patient ID: [name], female   DOB: December 09, 1950, 60 y.o.   MRN: [id number]  Subjective/Complaints: Constipation and pain in back.  Unfortunately when she receives pain mediicne nursing is reporting that she is becoming lethargic.    Objective: Vital Signs: Blood pressure 121/74, pulse 124, temperature 99.6 F (37.6 C), temperature source Oral, resp. rate 20, height 5\' 5"  (1.651 m), weight 61.6 kg (135 lb 12.9 oz), SpO2 92.00%. No results found. No results found for this basename: WBC:2,HGB:2,HCT:2,PLT:2 in the last 72 hours  Basename 02/04/11 0655  NA 131*  K 5.1  CL 89*  CO2 27  GLUCOSE 83  BUN 37*  CREATININE 7.34*  CALCIUM 8.7   CBG (last 3)  No results found for this basename: GLUCAP:3 in the last 72 hours  Wt Readings from Last 3 Encounters:  02/06/11 61.6 kg (135 lb 12.9 oz)  01/31/11 60.2 kg (132 lb 11.5 oz)  01/31/11 60.2 kg (132 lb 11.5 oz)    Physical Exam:  General appearance: alert Head: Normocephalic, without obvious abnormality, atraumatic Eyes: conjunctivae/corneas clear. PERRL, EOM's intact. Fundi benign. Ears: normal TM's and external ear canals both ears Nose: Nares normal. Septum midline. Mucosa normal. No drainage or sinus tenderness. Throat: lips, mucosa, and tongue normal; teeth and gums normal Neck: no adenopathy, no carotid bruit, no JVD, supple, symmetrical, trachea midline and thyroid not enlarged, symmetric, no tenderness/mass/nodules Back: symmetric, no curvature. ROM normal. No CVA tenderness. Resp: clear to auscultation bilaterally Cardio: regular rate and rhythm, S1, S2 normal, no murmur, click, rub or gallop GI: soft, non-tender; bowel sounds normal; no masses,  no organomegaly Extremities: extremities normal, atraumatic, no cyanosis or edema Pulses: 2+ and symmetric Skin: Skin color, texture, turgor normal. No rashes or lesions Neurologic: bilateral foot  drop and sensory loss in the L4 and L5 dermatomes Incision/Wound: clean and intact without drainage.   Assessment/Plan: 1. Functional deficits secondary to lumbar stenosis and radiculopathy s/p lami and fusion which require 3+ hours per day of interdisciplinary therapy in a comprehensive inpatient rehab setting. Physiatrist is providing close team supervision and 24 hour management of active medical problems listed below. Physiatrist and rehab team continue to assess barriers to discharge/monitor patient progress toward functional and medical goals.   PRAFO's for bilateral LE's worn last night.  Mobility: Bed Mobility Bed Mobility: Yes Rolling Left: 5: Supervision;With rail Left Sidelying to Sit: 5: Supervision;With rails Left Sidelying to Sit Details (indicate cue type and reason): educated on log roll for back precautions Transfers Transfers: Yes Sit to Stand: 3: Mod assist;With upper extremity assist;From bed Sit to Stand Details (indicate cue type and reason): vc and assist with foot placement Stand to Sit: 3: Mod assist;With armrests Stand to Sit Details: vc for hand placement Stand Pivot Transfers: 3: Mod assist (rw) Stand Pivot Transfer Details (indicate cue type and reason): vc for walker placement and hip extension Squat Pivot Transfers: 4: Min assist Ambulation/Gait Ambulation/Gait Assistance: 3: Mod assist Ambulation/Gait Assistance Details (indicate cue type and reason): ACE wraps used for dorsiflexion. VC needed for hip extension , foot placement, and walker position especially with turning Ambulation Distance (Feet): 15 Feet (5' x 1) Assistive device: Rolling walker Gait Pattern: Decreased dorsiflexion - right;Decreased dorsiflexion - left;Scissoring;Trunk flexed (left knee hyperextension) Wheelchair Mobility Wheelchair Mobility: Yes Wheelchair Assistance: 5: Supervision Wheelchair Propulsion: Both upper extremities Wheelchair Parts Management: Needs  assistance Distance: 70 ADL:  Cognition: Cognition Overall Cognitive Status: Appears within functional limits for tasks assessed Arousal/Alertness: Awake/alert Orientation Level: Oriented X4 Cognition Arousal/Alertness: Awake/alert Orientation Level: Oriented X4  Medical Problem List and Plan:  1.Lumbar stenosis with radiculopathy.Lumbar 4-5 and L5-S1 Laminectomy and fusion 11/14  2. DVT Prophylaxis/Anticoagulation: SCD'S, consider sq lovenox   3. Pain Management: Will continue with hydrocodone.  Neurontin stopped; Pain a little better today.  Explained to the patient that we need to balance pain relief with her neuro/arousal status. Scheduled norco before therapies was initiated yesterday.  -I DON'T WANT HER RECEIVING DILAUDID GIVEN RECENT HX OF NARC INTOLERANCE!  4.ESRD-dialysis per [employer] .Weigh daily.Follow up labs.Strict i/o;s. Schedule HD post therapy day so as not to interfere with schedule and patient progress.   5.HTN-lopressor.Monitor with increased activity. She is normotensive today. Will hold metoprolol for SBP< 90.   6. Mood:Ego support by team.. She is in good spirits at present.   7.Acute on chronic anemia: hgb stable. Intervention per [facility].   8. Constipation:  Augment bowel regimen. Moved bowels yesterday.  9. Fever: w/u negative.  Low grade temp last night.   Wound reinspected today and looks good. Continue to closely monitor.     [name] T 02/06/2011, 7:15 AM

## 2011-02-06 NOTE — Progress Notes (Signed)
Patient alert and oriented x 3 but does have some short term memory deficits and can be lethargic at times. Pain medication given last at 0842 1 vicodin. Patient reported some relief with this. Patient does not void- last bowel movement on 11/20-continent. Patient graft to RUA positive for bruit and thrill. Patient back incision OTA with  dermabond -no drainage noted. Patient able to stand pivot with min assist. Patient wears corset OOB. Patient appetite poor- patient has been refusing Nepro. Patient calls appropriately but continue bed alarm due to poor judgment at times. Patient has a hemoglobin of 7.6 on CBC done today- Dr Lorrene Reid notified of CBC results- new orders received. Patient in dialysis this evening. Continue with plan of care.

## 2011-02-06 NOTE — Progress Notes (Signed)
Physical Therapy Treatment Note  Patient Details  Name: [name] MRN: [id number] Date of Birth: 09-Sep-1950 Today's Date: 02/06/2011  Individual therapy Treatment time: 1330-1415 (45 min)  C/o 8/10 back pain. Notified RN but pt unable to receive further pain medicine for another hour. Pt requested to stay in her room for this therapy session. Seated lower extremity therex with 3# weight on RLE and 2# on LLE for LAQ and seated marches. Sit to stand with cueing for technique steadyA. Neuro re-ed with gait in pt room x 15', cueing to left left lower extremity in order to clear bilateral feet. Pt reports she might wear AFO's if ordered, will need to try with her shoes in a future therapy session. S with bed mobility with extra time and cues to maintain back precautions.   Canary Brim [facility] 02/06/2011, 2:20 PM

## 2011-02-06 NOTE — Progress Notes (Signed)
Temp 100.7 going into dialysis.  WBC up to 14,000.  No focal findings.  Blood cultures.  Vanco/fortaz per pharmacy.  CXR. Pt also to receive 1 unit PRBC's i HD due to HB of 7.6 (decr from  9.3). Check stools.  On Max Aranesp.   [name] B

## 2011-02-06 NOTE — Progress Notes (Signed)
Pt returned from dialysis 2045 with vitals HR139, BP 108/66, and temp 100.3.  Pt received one unit of RBC today in dialysis(drop from 9.3 to 7.6 Hemoglobin) as well as Vancomycin and Fortaz.  Infection evident. Tylenol given with temp having slight drop to 99.8. Pt very restless with agitation evident. Pt states she doesn't feel well with complains of pain in her back and states her feet feel heavy. Do to the high fevers throughout the day, confusion/lathargic noted at times, high HR, and abnormal feeling that pt stated I notified Mar[place]e [place], [place]. Discussed condition with PA, xanax .0125 ordered PRN Q8hrs for anxiety, will cont to monitor pt throughout shift.

## 2011-02-06 NOTE — Progress Notes (Signed)
Occupational Therapy Session Note  Patient Details  Name: [name] MRN: [id number] Date of Birth: March 29, 1950  Today's Date: 02/06/2011 Time: 0715-0811 Time Calculation (min): 56 min  Precautions: Precautions Precautions: Back Required Braces or Orthoses: Yes Spinal Brace: Lumbar corset;Applied in sitting position Restrictions Weight Bearing Restrictions: No  Short Term Goals: OT Short Term Goal 1: Pt. will engage in bathing at supervision level using AE prn.  OT Short Term Goal 2: Pt. will engage in dressing at supervision level using AE Prn. OT Short Term Goal 3: Pt. will transfer to toilet with supervision level  OT Short Term Goal 4: Pt. will perform toileting at minimal assist level OT Short Term Goal 5: Pt. will transfer to tub/shower at minimal assist level  Skilled Therapeutic Interventions/Progress Updates:    ADL retraining including bathing and dressing at shower level.  Upon arrival pt resting in bed.  Pt from supine to sit supervision and minimal assistance sitting EOB to w/c. Pt crossed legs to thread pants without use of reacher.   Pt required moderate assist with sit to stand to pull pants up  secondary to fatigue and pt c/o  8/10 pain.     Pt minimal assistance utilizing grab bars for tub/shower bench transfer for ADL.  Pt used long handle sponge to bathe LB with min verbal cues to follow back precautions.  Pt sitting in w/c at sink level to complete grooming task with supervision.   Pt donned back brace with supervision.  Focus on sit to stand, dynamic standing balance to increase safety with back precautions during self care tasks.     Pain Pain Assessment Pain Assessment: 0-10 (8) Pain Location: Back Pain Onset: On-going Nursing notified     Therapy/Group: Individual Therapy  Virgia Land 02/06/2011, 8:13 AM

## 2011-02-06 NOTE — Progress Notes (Addendum)
Subjective: Seen in rehab, no current complaints, but right hip pain and lower extremity weakness with ambulation. Still can't dorsiflex foot; standing some Objective: Vital signs in last 24 hours: Temp:  [98.8 F (37.1 C)-99.6 F (37.6 C)] 99.6 F (37.6 C) (11/21 0433) Pulse Rate:  [114-124] 124  (11/21 0433) Resp:  [20-22] 20  (11/21 0433) BP: (95-121)/(58-74) 121/74 mmHg (11/21 0433) SpO2:  [92 %-97 %] 92 % (11/21 0433) Weight:  [61.6 kg (135 lb 12.9 oz)] 135 lb 12.9 oz (61.6 kg) (11/21 0500) Weight change: 3.1 kg (6 lb 13.3 oz)   SCHEDULED MEDS:     . calcium carbonate  1-2 tablet Oral TID WC  . darbepoetin (ARANESP) injection - DIALYSIS  200 mcg Intravenous Q Sat-HD  . enoxaparin (LOVENOX) injection  30 mg Subcutaneous Q24H  . feeding supplement (NEPRO CARB STEADY)  237 mL Oral BID BM  . pantoprazole  40 mg Oral Q1200  . polyethylene glycol  17 g Oral Daily  . senna-docusate  2 tablet Oral QHS  . white petrolatum      . DISCONTD: HYDROcodone-acetaminophen  1 tablet Oral BID   Intake/Output from previous day: 11/20 0701 - 11/21 0700 In: 360 [P.O.:360] Out: 1 [Stool:1] Intake/Output this shift: Total I/O In: 240 [P.O.:240] Out: -  EXAM: General appearance:  Alert, in no apparent distress Resp:  CTA B Cardio:  Tachycardic, without murmur GI:  + BS, soft & nontender Extremities:  No edema, foot drop (L>R) Access:  AVF @ RUA  Lab Results: No results found for this basename: WBC:2,HGB:2,HCT:2,PLT:2 in the last 72 hours BMET:  [facility] 02/04/11 0655  NA 131*  K 5.1  CL 89*  CO2 27  GLUCOSE 83  BUN 37*  CREATININE 7.34*  CALCIUM 8.7  ALBUMIN 2.4*   No results found for this basename: PTH:2 in the last 72 hours Iron Studies: No results found for this basename: IRON,TIBC,TRANSFERRIN,FERRITIN in the last 72 hours  Assessment/Plan: 1.  Back surgery - s/p lumbar decompression and stabilization surgery per Dr. Sherwood Gambler on 11/14, now receiving rehab. 2.  ESRD -  on HD on TTS outpt at [facility], HD today on holiday schedule ([facility]) 3.  Anemia - last Hgb 9.3 on 11/16, s/p Aranesp 200 mcg on11/17. 4.  Fever - 99.6 since yesterday, last WBC 6700 on 11/16, CXR on 11/18 with linear left lung base opacities reflecting scarring or atelectasis.  Check CBC today. 5.  Secondary hyperparathyroidism - s/p parathyroidectomy, on Calcium Carbonate. 6.  Hypertension/Volume - BP stable without meds, no signs or symptoms of fluid overload. 7.  Nutrition - Alb low @ 2.4 on supplements. 8.  GERD - on PPI.        LOS: 5 days   [name],CHARLES 02/06/2011,9:29 AM  I have seen and examined this patient and agree with findings and with plan.  Dialysis today on holiday schedule ([facility] = [facility] this week) Also, called by RN that Hb has returned at 7.6 - transfuse one unit on dialysis; on max aranesp Reggie Bise B,MD 02/06/2011 11:51 AM

## 2011-02-06 NOTE — Progress Notes (Signed)
ANTIBIOTIC CONSULT NOTE - INITIAL  Pharmacy Consult for Vancomycin/Fortaz Indication: rule out sepsis  Allergies  Allergen Reactions  . Contrast Media (Iodinated Diagnostic Agents)     Seziure  . Iohexol Other (See Comments)    Reaction is convulsions    Patient Measurements: Height: 5\' 5"  (165.1 cm) Weight: 126 lb 5.2 oz (57.3 kg) IBW/kg (Calculated) : 57    Vital Signs: Temp: 98.9 F (37.2 C) (11/21 1720) Temp src: Oral (11/21 1720) BP: 130/77 mmHg (11/21 1730) Pulse Rate: 140  (11/21 1730) Tmax 100.7 in HD today Intake/Output from previous day: 11/20 0701 - 11/21 0700 In: 360 [P.O.:360] Out: 1 [Stool:1] Intake/Output from this shift: Total I/O In: 710 [P.O.:360; Blood:350] Out: -   Labs:  Basename 02/06/11 1106 02/04/11 0655  WBC 14.0* --  HGB 7.6* --  PLT 301 --  LABCREA -- --  CREATININE -- 7.34*   Estimated Creatinine Clearance: 7.3 ml/min (by C-G formula based on Cr of 7.34). No results found for this basename: VANCOTROUGH:2,VANCOPEAK:2,VANCORANDOM:2,GENTTROUGH:2,GENTPEAK:2,GENTRANDOM:2,TOBRATROUGH:2,TOBRAPEAK:2,TOBRARND:2,AMIKACINPEAK:2,AMIKACINTROU:2,AMIKACIN:2, in the last 72 hours   Microbiology: Recent Results (from the past 720 hour(s))  SURGICAL PCR SCREEN     Status: Normal   Collection Time   01/25/11  9:10 AM      Component Value Range Status Comment   MRSA, PCR NEGATIVE  NEGATIVE  Final    Staphylococcus aureus NEGATIVE  NEGATIVE  Final   CULTURE, BLOOD (ROUTINE X 2)     Status: Normal (Preliminary result)   Collection Time   02/03/11  4:09 PM      Component Value Range Status Comment   Specimen Description BLOOD LEFT ARM   Final    Special Requests     Final    Value: BOTTLES DRAWN AEROBIC AND ANAEROBIC 8CC AER, 6CC ANA   Setup Time [id number]   Final    Culture     Final    Value:        BLOOD CULTURE RECEIVED NO GROWTH TO DATE CULTURE WILL BE HELD FOR 5 DAYS BEFORE ISSUING A FINAL NEGATIVE REPORT   Report Status PENDING    Incomplete   CULTURE, BLOOD (ROUTINE X 2)     Status: Normal (Preliminary result)   Collection Time   02/03/11  4:19 PM      Component Value Range Status Comment   Specimen Description BLOOD LEFT HAND   Final    Special Requests BOTTLES DRAWN AEROBIC ONLY [place]   Final    Setup Time [id number]   Final    Culture     Final    Value:        BLOOD CULTURE RECEIVED NO GROWTH TO DATE CULTURE WILL BE HELD FOR 5 DAYS BEFORE ISSUING A FINAL NEGATIVE REPORT   Report Status PENDING   Incomplete     Medical History: Past Medical History  Diagnosis Date  . Seizures     r/t HTN in 1990's x 1  . Brain aneurysm   . Blood transfusion 1990's    r/t Kidney removal surgery  . Arthritis     Back  . Anemia   . Umbilical hernia age 22  . Heart murmur     Born with heart mumur, does not require follow up per pt  . Hypertension     Does not see a heart doctor, had pre transplant stress test at [facility]    . Diabetes mellitus     Borderline  . End stage renal disease on dialysis  T/Th/Sat dialysis on [employer]  . End stage renal disease on dialysis     01-29-2011  . Hepatitis C     Medications:  Scheduled:    . calcium carbonate  1-2 tablet Oral TID WC  . darbepoetin (ARANESP) injection - DIALYSIS  200 mcg Intravenous Q Sat-HD  . enoxaparin (LOVENOX) injection  30 mg Subcutaneous Q24H  . feeding supplement (NEPRO CARB STEADY)  237 mL Oral BID BM  . pantoprazole  40 mg Oral Q1200  . polyethylene glycol  17 g Oral Daily  . senna-docusate  2 tablet Oral QHS  . white petrolatum       Assessment: 60 yo female with fevers/AMS, h/o ESRD for empiric antibiotics    Goal of Therapy:  Vancomycin pre-HD level 15-25  Plan:  Vancomycin 1 G IV now, then 500 mg IV after each HD Fortaz 2 g IV now and after each HD  Yides Saidi, Bronson Curb 02/06/2011,5:34 PM

## 2011-02-06 NOTE — Progress Notes (Signed)
Physical Therapy Session Note  Patient Details  Name: [name] MRN: [id number] Date of Birth: December 12, 1950  Today's Date: 02/06/2011 Time:0840-0930 and  1035-1100 Time Calculation (min):50 and 25 min  Precautions: Precautions Precautions: Back Required Braces or Orthoses: Yes Spinal Brace: Lumbar corset;Applied in sitting position Restrictions Weight Bearing Restrictions: No  Skilled Therapeutic Interventions: 0840 Treatment focused on bilateral LE strengthening of hips, knees and ankles in sitting and standing: hip flexion, knee extension, and ankle circles in sitting x 10 each in preparation for standing.  Wt shifting L<>R with RW, working on hip and knee control, with hip extension at neutral;squats with UE support by PT, 10 x 1.  Gait training with RW, x 34' with min A, with ACE on L ankle due to foot drop .  Gait deviations include narrow base of support due to weak R hip, with RLE adducting and ERing at the hip during swing, L knee hyperextension during mid stance due to weak knee and ankle; bilateral foot drop, L>R.  Pt needed moderate VCs for safety using RW due to pushing it too far forward of feet, and not bringing it around with her during turns and sitting. W/c mobility x 77' with S for increased activity tolerance. Bilateral footrests raised for increased comfort and seating in w/c.     Pt left in room in w/c with call bell and phone in reach.  1035 Treatment focused on therex and gait.  Therex in standing at 4' stairs alternating foot taps onto step, tolerating 5x 1 each side before needing to sit due to increased pain LB, x 2.  Pt worked on R hip control by placing R foot on a targeted spot on step to facilitate hip abduction.  Backwards stepping from steps to wc  X 2, with mod VCs for safety and pt wants to sit early.  Gait training from hall to recliner in room, x 30' with RW, with min A, with gait deviations and safety concerns as above, improving with VCs and tactle cues  for R foot placement.  Pt left in reclined position in room with family present, phone and call bell within reach. VCs for back precations as pt attempted to rotate to speak to family member.  General: chart reviewed     Pain Pain Assessment Pain Score:  0840 8/10 LB; RN aware and medicated during tx; 1035 7/10 LB; premedicated      Trunk/Postural Assessment: pt demonstrated forward leaning posture throughout all upright activities; decreasing with VCs, both txs          Therapy/Group: Individual Therapy  Kia Stavros 02/06/2011, 12:48 PM

## 2011-02-07 ENCOUNTER — Inpatient Hospital Stay (HOSPITAL_COMMUNITY): Payer: Medicare Other

## 2011-02-07 DIAGNOSIS — IMO0002 Reserved for concepts with insufficient information to code with codable children: Secondary | ICD-10-CM

## 2011-02-07 DIAGNOSIS — N186 End stage renal disease: Secondary | ICD-10-CM

## 2011-02-07 DIAGNOSIS — Q762 Congenital spondylolisthesis: Secondary | ICD-10-CM

## 2011-02-07 LAB — RENAL FUNCTION PANEL
Albumin: 2.2 g/dL — ABNORMAL LOW (ref 3.5–5.2)
Calcium: 8.5 mg/dL (ref 8.4–10.5)
GFR calc Af Amer: 15 mL/min — ABNORMAL LOW (ref >90)
GFR calc non Af Amer: 13 mL/min — ABNORMAL LOW (ref >90)
Glucose, Bld: 106 mg/dL — ABNORMAL HIGH (ref 70–99)
Phosphorus: 3.2 mg/dL (ref 2.3–4.6)
Sodium: 135 mEq/L (ref 135–145)

## 2011-02-07 LAB — CBC
MCH: 28.9 pg (ref 26.0–34.0)
MCV: 83.2 fL (ref 78.0–100.0)
Platelets: 297 10*3/uL (ref 150–400)
RDW: 14.8 % (ref 11.5–15.5)
WBC: 15.9 10*3/uL — ABNORMAL HIGH (ref 4.0–10.5)

## 2011-02-07 LAB — TYPE AND SCREEN: Antibody Screen: NEGATIVE

## 2011-02-07 MED ORDER — PANTOPRAZOLE SODIUM 40 MG IV SOLR
40.0000 mg | Freq: Two times a day (BID) | INTRAVENOUS | Status: DC
  Administered 2011-02-07 – 2011-02-09 (×5): 40 mg via INTRAVENOUS
  Filled 2011-02-07 (×7): qty 40

## 2011-02-07 MED ORDER — METOCLOPRAMIDE HCL 5 MG/ML IJ SOLN
5.0000 mg | Freq: Four times a day (QID) | INTRAMUSCULAR | Status: DC
  Administered 2011-02-07: 5 mg via INTRAVENOUS
  Administered 2011-02-08: 10 mg via INTRAVENOUS
  Administered 2011-02-08 (×2): 5 mg via INTRAVENOUS
  Administered 2011-02-08: 18:00:00 via INTRAVENOUS
  Administered 2011-02-08 – 2011-02-09 (×3): 5 mg via INTRAVENOUS
  Filled 2011-02-07 (×12): qty 1

## 2011-02-07 MED ORDER — DEXTROSE 5 % IV SOLN
2.0000 g | INTRAVENOUS | Status: DC
  Filled 2011-02-07: qty 2

## 2011-02-07 MED ORDER — VANCOMYCIN HCL 500 MG IV SOLR
500.0000 mg | INTRAVENOUS | Status: DC
  Filled 2011-02-07: qty 500

## 2011-02-07 NOTE — Progress Notes (Signed)
Subjective: Currently comfortable, but recent abdominal and low back pain, poor appetite; fever yesterday.  Objective: Vital signs in last 24 hours: Temp:  [98.4 F (36.9 C)-100.7 F (38.2 C)] 98.4 F (36.9 C) (11/22 0615) Pulse Rate:  [103-145] 103  (11/22 0615) Resp:  [20] 20  (11/22 0615) BP: (94-143)/(47-80) 105/66 mmHg (11/22 0615) SpO2:  [90 %-97 %] 96 % (11/22 0615) Weight:  [56.3 kg (124 lb 1.9 oz)-57.3 kg (126 lb 5.2 oz)] 125 lb 3.2 oz (56.79 kg) (11/22 0615) Weight change: -4.3 kg (-9 lb 7.7 oz)  Intake/Output from previous day: 11/21 0701 - 11/22 0700 In: 1060 [P.O.:360; Blood:700] Out: 1445    EXAM: General appearance:  Alert, in no apparent distress Resp:  CTA B  Cardio:  Tachycardic, without murmur GI:    Decreased BS, distended, general tenderness to palpation  Extremities:  No edema Access:  AVF @ RUA  Lab Results:  Basename 02/07/11 0730 02/06/11 1106  WBC 15.9* 14.0*  HGB 8.6* 7.6*  HCT 24.8* 21.9*  PLT 297 301   BMET:  Basename 02/07/11 0730  NA 135  K 3.4*  CL 93*  CO2 29  GLUCOSE 106*  BUN 24*  CREATININE 3.56*  CALCIUM 8.5  ALBUMIN 2.2*   No results found for this basename: PTH:2 in the last 72 hours Iron Studies: No results found for this basename: IRON,TIBC,TRANSFERRIN,FERRITIN in the last 72 hours  Assessment/Plan: 1.  Back surgery - s/p lumbar decompression and stabilization surgery per Dr. Sherwood Gambler on 11/14, receiving rehab. 2.  Fever - T of 100.7 yesterday @ HD, blood cultures pending, currently afebrile s/p Vancomycin and Fortaz yesterday with HD, but WBCs have increased to 15.9; had abdominal X-ray this AM to evaluate ab pain, ? Of small bowel obstruction.  No N or V.  NPO until symptoms resolve. 3.  ESRD - on HD on TTS, s/p HD yesterday on holiday schedule, K 3.4, stable. 4.  Anemia - Hgb fell to 7.6 yesterday, 8.6 today s/p transfusion of 1 unit, on Aranesp. 5.  Secondary hyperparathyroidism - s/p parathyroidectomy in 2002, on  Calcium Carbonate. 6.  Hypertension/Volume - BP slightly low without meds, possibly secondary to infection; no signs or symptoms of fluid overload. 7.  Nutrition - Alb 2.2, poor appetite secondary to abdominal pain. 8.  GERD - began PPI.         LOS: 6 days   JennaJenna Mccarthy 02/07/2011,10:51 AM   I am examined the patient, reviewed her database and agree with the assessment and plan as outlined by Ramiro Harvest PA. Agree with placing her on an NPO status given symptomatic colonic ileus. With leukocytosis and fever, would have a very low threshold for testing and treating for C.diff colitis.  Jailyne Chieffo K.

## 2011-02-07 NOTE — Progress Notes (Signed)
Patient is alert and oriented x3. She complains of chronic back pain and was medicated several times this shift. See MAR.  Patient has complained of some stomach discomfort and abd xray was done. Patient is now NPO except for medications. Patient given Sorbitol for no BM in 2days. No results yet. Continue current plan of care.

## 2011-02-07 NOTE — Progress Notes (Signed)
Patient ID: RUBIE KAUFHOLD, female   DOB: Feb 15, 1951, 60 y.o.   MRN: [id number] Patient ID: ERMINA AZZOLINA, female   DOB: 16-Mar-1951, 60 y.o.   MRN: [id number] Patient ID: REKEISHA DOCKETT, female   DOB: 1950/06/24, 60 y.o.   MRN: [id number]  Subjective/Complaints: Constipation and pain in back.  Unfortunately when she receives pain mediicne nursing is reporting that she is becoming lethargic.  Nausea and reflux  Objective: Vital Signs: Blood pressure 105/66, pulse 103, temperature 98.4 F (36.9 C), temperature source Oral, resp. rate 20, height 5\' 5"  (1.651 m), weight 56.79 kg (125 lb 3.2 oz), SpO2 96.00%. No results found.  Basename 02/07/11 0730 02/06/11 1106  WBC 15.9* 14.0*  HGB 8.6* 7.6*  HCT 24.8* 21.9*  PLT 297 301   No results found for this basename: NA:2,K:2,CL:2,CO2:2,GLUCOSE:2,BUN:2,CREATININE:2,CALCIUM:2 in the last 72 hours CBG (last 3)  No results found for this basename: GLUCAP:3 in the last 72 hours  Wt Readings from Last 3 Encounters:  02/07/11 56.79 kg (125 lb 3.2 oz)  01/31/11 60.2 kg (132 lb 11.5 oz)  01/31/11 60.2 kg (132 lb 11.5 oz)    Physical Exam:  General appearance: alert Head: Normocephalic, without obvious abnormality, atraumatic Eyes: conjunctivae/corneas clear. PERRL, EOM's intact. Fundi benign. Ears: normal TM's and external ear canals both ears Nose: Nares normal. Septum midline. Mucosa normal. No drainage or sinus tenderness. Throat: lips, mucosa, and tongue normal; teeth and gums normal Neck: no adenopathy, no carotid bruit, no JVD, supple, symmetrical, trachea midline and thyroid not enlarged, symmetric, no tenderness/mass/nodules Back: symmetric, no curvature. ROM normal. No CVA tenderness. Resp: clear to auscultation bilaterally Cardio: regular rate and rhythm, S1, S2 normal, no murmur, click, rub or gallop GI: Distended tender +tympany Extremities: extremities normal, atraumatic, no cyanosis or edema Pulses: 2+ and symmetric Skin: Skin  color, texture, turgor normal. No rashes or lesions Neurologic: bilateral foot drop and sensory loss in the L4 and L5 dermatomes Incision/Wound: clean and intact without drainage.   Assessment/Plan: 1. Functional deficits secondary to lumbar stenosis and radiculopathy s/p lami and fusion which require 3+ hours per day of interdisciplinary therapy in a comprehensive inpatient rehab setting. Physiatrist is providing close team supervision and 24 hour management of active medical problems listed below. Physiatrist and rehab team continue to assess barriers to discharge/monitor patient progress toward functional and medical goals.   PRAFO's for bilateral LE's worn last night.  Mobility: Bed Mobility Bed Mobility: Yes Rolling Left: 5: Supervision;With rail Left Sidelying to Sit: 5: Supervision;With rails Left Sidelying to Sit Details (indicate cue type and reason): educated on log roll for back precautions Transfers Transfers: Yes Sit to Stand: 3: Mod assist;With upper extremity assist;From bed Sit to Stand Details (indicate cue type and reason): vc and assist with foot placement Stand to Sit: 3: Mod assist;With armrests Stand to Sit Details: vc for hand placement Stand Pivot Transfers: 3: Mod assist (rw) Stand Pivot Transfer Details (indicate cue type and reason): vc for walker placement and hip extension Squat Pivot Transfers: 4: Min assist Ambulation/Gait Ambulation/Gait Assistance: 3: Mod assist Ambulation/Gait Assistance Details (indicate cue type and reason): ACE wraps used for dorsiflexion. VC needed for hip extension , foot placement, and walker position especially with turning Ambulation Distance (Feet): 15 Feet (5' x 1) Assistive device: Rolling walker Gait Pattern: Decreased dorsiflexion - right;Decreased dorsiflexion - left;Scissoring;Trunk flexed (left knee hyperextension) Wheelchair Mobility Wheelchair Mobility: Yes Wheelchair Assistance: 5: Supervision Wheelchair  Propulsion: Both upper extremities Wheelchair Parts Management: Needs assistance  Distance: 70 ADL:   Cognition: Cognition Overall Cognitive Status: Appears within functional limits for tasks assessed Arousal/Alertness: Awake/alert Orientation Level: Oriented X4 Cognition Arousal/Alertness: Awake/alert Orientation Level: Oriented X4  Medical Problem List and Plan:  1.Lumbar stenosis with radiculopathy.Lumbar 4-5 and L5-S1 Laminectomy and fusion 11/14  2. DVT Prophylaxis/Anticoagulation: SCD'S, consider sq lovenox   3. Pain Management: Will continue with hydrocodone.  Neurontin stopped; Pain a little better today.  Explained to the patient that we need to balance pain relief with her neuro/arousal status. Scheduled norco before therapies was initiated yesterday.  -I DON'T WANT HER RECEIVING DILAUDID GIVEN RECENT HX OF NARC INTOLERANCE!  4.ESRD-dialysis per [employer] .Weigh daily.Follow up labs.Strict i/o;s. Schedule HD post therapy day so as not to interfere with schedule and patient progress.   5.HTN-lopressor.Monitor with increased activity. She is normotensive today. Will hold metoprolol for SBP< 90.   6. Mood:Ego support by team.. She is in good spirits at present.   7.Acute on chronic anemia: hgb stable. Intervention per [facility].   8. Constipation:  Augment bowel regimen. Moved bowels yesterday.  9. Fever: w/u negative.  Low grade temp last night.   Wound reinspected today and looks good. Continue to closely monitor.  Started empirically on Fortaz and vanc per Nephrology  10. Probable paralytic ileus vs SBO will check abd series, diet to clear liquid may need IVF but defer to renal on this, minimize narc, give PPI and reglan    Carr Shartzer E 02/07/2011, 8:13 AM

## 2011-02-08 ENCOUNTER — Inpatient Hospital Stay (HOSPITAL_COMMUNITY): Payer: Medicare Other

## 2011-02-08 ENCOUNTER — Encounter (HOSPITAL_COMMUNITY): Payer: Self-pay | Admitting: Radiology

## 2011-02-08 LAB — RENAL FUNCTION PANEL
Albumin: 2.1 g/dL — ABNORMAL LOW (ref 3.5–5.2)
Calcium: 8.4 mg/dL (ref 8.4–10.5)
Chloride: 93 mEq/L — ABNORMAL LOW (ref 96–112)
Creatinine, Ser: 5.78 mg/dL — ABNORMAL HIGH (ref 0.50–1.10)
GFR calc non Af Amer: 7 mL/min — ABNORMAL LOW (ref >90)

## 2011-02-08 LAB — CBC
MCH: 28.4 pg (ref 26.0–34.0)
MCHC: 34.7 g/dL (ref 30.0–36.0)
Platelets: 344 10*3/uL (ref 150–400)

## 2011-02-08 MED ORDER — SENNOSIDES-DOCUSATE SODIUM 8.6-50 MG PO TABS
3.0000 | ORAL_TABLET | Freq: Two times a day (BID) | ORAL | Status: DC
Start: 2011-02-08 — End: 2011-02-09
  Administered 2011-02-08 – 2011-02-09 (×3): 3 via ORAL
  Filled 2011-02-08 (×3): qty 3

## 2011-02-08 MED ORDER — CEFTAZIDIME 2 G IJ SOLR
2.0000 g | INTRAMUSCULAR | Status: DC
Start: 2011-02-09 — End: 2011-02-09
  Filled 2011-02-08: qty 2

## 2011-02-08 MED ORDER — TRAMADOL HCL 50 MG PO TABS
50.0000 mg | ORAL_TABLET | Freq: Four times a day (QID) | ORAL | Status: DC
  Administered 2011-02-08 – 2011-02-09 (×4): 50 mg via ORAL
  Filled 2011-02-08 (×5): qty 1

## 2011-02-08 MED ORDER — RENA-VITE PO TABS
1.0000 | ORAL_TABLET | Freq: Every day | ORAL | Status: DC
  Administered 2011-02-08: 1 via ORAL
  Filled 2011-02-08 (×2): qty 1

## 2011-02-08 MED ORDER — VANCOMYCIN HCL 500 MG IV SOLR
500.0000 mg | INTRAVENOUS | Status: DC
  Filled 2011-02-08: qty 500

## 2011-02-08 NOTE — Progress Notes (Signed)
Occupational Therapy Session Note  Patient Details  Name: [name] MRN: [id number] Date of Birth: 01-22-1951 Today's Date: 02/08/2011  Time: [id number] Time Calculation: 54 Minutes Individual Therapy Patient c/o 6/10 pain; RN aware  Skilled therapy intervention for focus on ADL retraining at shower level using tub transfer bench. Performed stand-pivot transfer from w/c level using grab bars prn with minimal assistance in/out of shower. Patient performed UB/LB bathing at supervision level. UB dressing performed at supervision level, LB dressing performed with moderate assistance; patient needed assistance with pulling pants up to waist while standing and donning bilateral shoes. Also focused on donning/doffing back brace; patient at supervision level secondary to patient needing min verbal cues to donn brace correct way. Patient with more than reasonable amount of time throughout session.   Tangi Shroff 02/08/2011, 9:42 AM

## 2011-02-08 NOTE — Progress Notes (Deleted)
Patient received po contrast at 1615 and 1715 for prep for CT scan

## 2011-02-08 NOTE — Progress Notes (Signed)
OccupationalTherapy Note  Patient Details  Name: [name] MRN: [id number] Date of Birth: [birth year] Today's Date: 02/08/2011  Time: 1330-1400 Time Calculation: 84 Minutes Individual Therapy No complaints of pain  Therapeutic activity focusing on dynamic standing balance/tolerance/eundurance, increasing overall activity tolerance/endurance, hip strengthening exercises to increase standing posture in order to increase independence with LB ADLs. Patient barely able to keep eyes open during session; per her report, she recently had "medicine".   Blase Beckner 02/08/2011, 2:48 PM

## 2011-02-08 NOTE — Progress Notes (Signed)
Patient received po contrast at 1615 and 1715 for prep for CT scan abdomen per order, Hazle Nordmann RN

## 2011-02-08 NOTE — Progress Notes (Signed)
Patient ID: [name], female   DOB: [birth year], 60 y.o.   MRN: [id number]  S:Reports that abdominal pain/discomfort has resolved after 2 large BMs yesterday following sorbitol administration. Colonic ileus clinically better and today on clear liquids. She reports an occasional cough with white sputum and is anuric.  O: BP 118/69  Pulse 103  Temp(Src) 98.6 F (37 C) (Oral)  Resp 20  Ht 5\' 5"  (1.651 m)  Wt 54.6 kg (120 lb 5.9 oz)  BMI 20.03 kg/m2  SpO2 95%  [id number] in W/Chair just back from PT/Rehab, waiting to eat. Resp:CTA bilaterally, no rales/rhonchi Abd: Soft, non tender, BS normal (examined after removing back brace) Back: Incision site over L spine appears well healed with minimal erythema and no fluctuance/expressible pus  Ext:No LE edema Right Upper Arm AVF site with no fluctuance, erythema or tenderness. Thinning and shiny skin over aneurysmal changes noted, but no focal infection.     . calcium carbonate  1-2 tablet Oral TID WC  . cefTAZidime (FORTAZ) IV  2 g Intravenous Q T,Th,Sa-HD  . darbepoetin (ARANESP) injection - DIALYSIS  200 mcg Intravenous Q Sat-HD  . enoxaparin (LOVENOX) injection  30 mg Subcutaneous Q24H  . feeding supplement (NEPRO CARB STEADY)  237 mL Oral BID BM  . metoCLOPramide (REGLAN) injection  5 mg Intravenous Q6H  . pantoprazole (PROTONIX) IV  40 mg Intravenous Q12H  . polyethylene glycol  17 g Oral Daily  . senna-docusate  3 tablet Oral BID  . traMADol  50 mg Oral Q6H  . vancomycin  500 mg Intravenous Q T,Th,Sa-HD  . DISCONTD: cefTAZidime (FORTAZ) IV  2 g Intravenous Q M,W,F-HD  . DISCONTD: senna-docusate  2 tablet Oral QHS  . DISCONTD: vancomycin  500 mg Intravenous Q M,W,F-HD    BMET  Lab 02/08/11 0630 02/07/11 0730 02/04/11 0655  NA 138 135 131*  K 3.8 3.4* 5.1  CL 93* 93* 89*  CO2 28 29 27   GLUCOSE 70 106* 83  BUN 46* 24* 37*  CREATININE 5.78* 3.56* 7.34*  ALB -- -- --  CALCIUM 8.4 8.5 8.7  PHOS 4.1 3.2 3.4    CBC  Lab 02/08/11 0630 02/07/11 0730 02/06/11 1106  WBC 21.2* 15.9* 14.0*  NEUTROABS -- -- --  HGB 8.3* 8.6* 7.6*  HCT 23.9* 24.8* 21.9*  MCV 81.8 83.2 82.3  PLT 344 297 301    @IMGRELPRIORS @  Assessment/Plan: 1. Back surgery - s/p lumbar decompression and stabilization surgery per Dr. Sherwood Gambler on 11/14, receiving rehab with fair progress so far with respect to ADL training.  2. Fever - Afebrile overnight but remain tachycardic and with Leukocytosis, 4 blood cultures so far negative ( from 11/18 and 11/21), on empiric Vancomycin and Fortaz yesterday with HD. NO focal signs of infection to prompt/direct treatment. Will repeat CXR and consider CT abdomen and pelvis vs TEE if results not revealing. 3. ESRD - on HD on TTS, s/p HD wednesday on holiday schedule, Plans for HD tomorrow.  4. Anemia - Hgb stable s/p transfusion of 1 unit, on Aranesp.  5. Secondary hyperparathyroidism - s/p parathyroidectomy in 2002, on Calcium Carbonate.  6. Hypertension/Volume - BP slightly low without meds, possibly secondary to infection; no signs or symptoms of fluid overload.  7. Nutrition - Alb 2.2, poor appetite secondary to abdominal pain, start ONS.  8. GERD - began PPI.     Lundynn Cohoon K.

## 2011-02-08 NOTE — Progress Notes (Signed)
Physical Therapy Session Note  Patient Details  Name: KELA BURKI MRN: [id number] Date of Birth: 24-Feb-1951  Today's Date: 02/08/2011 Time: 1400-1455 Time Calculation (min): 55 min  Precautions: Precautions Precautions: Back Required Braces or Orthoses: Yes Spinal Brace: Lumbar corset;Applied in sitting position Restrictions Weight Bearing Restrictions: No  Short Term Goals: PT Short Term Goal 1: Pt min A functional transfers PT Short Term Goal 2: Pt min A gait 50' in controlled environment PT Short Term Goal 3: Pt min A dynamic standing balance for functional task  Skilled Therapeutic Interventions/Progress Updates: Focus of treatment: Bilateral LE strengthening:Transfer training; Bed mobility to decrease required assistance           Pain Assessment Pain Score:   6/10 - meds given Mobility Transfers- min assist RW for sit to stand and standing balance; sit to side to supine mod assist (bilateral LEs); supine to side to sit min assist with vcs to observe back precautions: sit to stand from mat mod assist with bilateral UE assist (decreased bilateral knee extension)   Locomotion W/C mobility SBA unit using bilateral UEs 120 feet  Trunk/Postural Assessment  Standing - increased anterior pelvic tilt with decreased hip/trunk extension and min assist to maintain static standing (loss of balance posteriorly) and decreased bilateral terminal knee extension control .         Exercises Bilateral hip flexion/extension in sidelying with min/mod manual resistance     Therapy/Group: Individual Therapy  Sharece Fleischhacker,JIM 02/08/2011, 2:29 PM

## 2011-02-08 NOTE — Progress Notes (Signed)
Physical Therapy Note  Patient Details  Name: JULYANA BEDRICK MRN: [id number] Date of Birth: 1950/07/13 Today's Date: 02/08/2011  11:00 until 11:45  Gait training with bil. Blue rocker AFOs to assist with dorsiflexion and knee hyperextension.  Gait 2 x 10' with mod assist. Pt. Continued to be flexed at hips and unable to advance either Leg for heel strike.  Pt. With short step length.  Attempted Eva walker to promote hip extension with improvement in hip extension, but not leg advancement.  Up and down 4 steps with 2 rails for strenghtening max assist.  Left leg gave in attempts to go up step with it. Overall AFOs made some improvement, but hip weakness limits gait the most.   Erle Crocker 02/08/2011, 11:57 AM

## 2011-02-08 NOTE — Progress Notes (Signed)
Per State Regulation 482.30 This chart was reviewed for medical necessity with respect to the patient's Admission/Duration of stay. Pt participating txs.  Very weak hips inhibit ambulation.  Ileus per abd xray.  Per report, pt has had BM s/p sorbitol--will need to evaluate need for bowel program.  Has had fever & received IV antibx in HD--also, received blood d/t decreased hgb.  Also, continuing work on pain management: balancing pain relief w/ mentation.   Theora Master                 Nurse Care Manager              Next Review Date:  02/11/11

## 2011-02-08 NOTE — Progress Notes (Signed)
Patient ID: [name], female   DOB: 11-Apr-1950, 60 y.o.   MRN: [id number] Called report from radiology. Pneumatosis and changes in ascending and transverse colon.  Will cont AB, and put on liquid, evaluate serially.  C/W  Ischemia.

## 2011-02-09 ENCOUNTER — Encounter (HOSPITAL_COMMUNITY)
Admission: RE | Disposition: A | Discharge status: Other Acute Inpatient Hospital | Payer: Self-pay | Source: Ambulatory Visit | Attending: Physical Medicine & Rehabilitation

## 2011-02-09 ENCOUNTER — Other Ambulatory Visit (INDEPENDENT_AMBULATORY_CARE_PROVIDER_SITE_OTHER): Payer: Self-pay | Admitting: Surgery

## 2011-02-09 ENCOUNTER — Inpatient Hospital Stay (HOSPITAL_COMMUNITY): Payer: Medicare Other

## 2011-02-09 ENCOUNTER — Inpatient Hospital Stay (HOSPITAL_COMMUNITY)
Admission: AD | Admit: 2011-02-09 | Discharge: 2011-02-25 | DRG: 329 | Disposition: A | Discharge status: Rehabilitation Facility | Payer: Medicare Other | Source: Other Acute Inpatient Hospital | Attending: Nephrology | Admitting: Nephrology

## 2011-02-09 ENCOUNTER — Inpatient Hospital Stay (HOSPITAL_COMMUNITY): Payer: Medicare Other | Admitting: Anesthesiology

## 2011-02-09 ENCOUNTER — Encounter (HOSPITAL_COMMUNITY): Payer: Self-pay | Admitting: Anesthesiology

## 2011-02-09 DIAGNOSIS — N189 Chronic kidney disease, unspecified: Secondary | ICD-10-CM | POA: Diagnosis present

## 2011-02-09 DIAGNOSIS — K55039 Acute (reversible) ischemia of large intestine, extent unspecified: Secondary | ICD-10-CM | POA: Diagnosis not present

## 2011-02-09 DIAGNOSIS — Z7682 Awaiting organ transplant status: Secondary | ICD-10-CM

## 2011-02-09 DIAGNOSIS — Z87891 Personal history of nicotine dependence: Secondary | ICD-10-CM

## 2011-02-09 DIAGNOSIS — B1920 Unspecified viral hepatitis C without hepatic coma: Secondary | ICD-10-CM | POA: Diagnosis present

## 2011-02-09 DIAGNOSIS — I10 Essential (primary) hypertension: Secondary | ICD-10-CM

## 2011-02-09 DIAGNOSIS — IMO0002 Reserved for concepts with insufficient information to code with codable children: Secondary | ICD-10-CM | POA: Diagnosis not present

## 2011-02-09 DIAGNOSIS — Z91041 Radiographic dye allergy status: Secondary | ICD-10-CM

## 2011-02-09 DIAGNOSIS — N186 End stage renal disease: Secondary | ICD-10-CM | POA: Diagnosis present

## 2011-02-09 DIAGNOSIS — Z992 Dependence on renal dialysis: Secondary | ICD-10-CM

## 2011-02-09 DIAGNOSIS — Y833 Surgical operation with formation of external stoma as the cause of abnormal reaction of the patient, or of later complication, without mention of misadventure at the time of the procedure: Secondary | ICD-10-CM | POA: Diagnosis present

## 2011-02-09 DIAGNOSIS — Y9210 Unspecified residential institution as the place of occurrence of the external cause: Secondary | ICD-10-CM | POA: Diagnosis present

## 2011-02-09 DIAGNOSIS — K626 Ulcer of anus and rectum: Secondary | ICD-10-CM | POA: Diagnosis present

## 2011-02-09 DIAGNOSIS — D62 Acute posthemorrhagic anemia: Secondary | ICD-10-CM | POA: Diagnosis not present

## 2011-02-09 DIAGNOSIS — K559 Vascular disorder of intestine, unspecified: Principal | ICD-10-CM | POA: Diagnosis present

## 2011-02-09 DIAGNOSIS — M48062 Spinal stenosis, lumbar region with neurogenic claudication: Secondary | ICD-10-CM

## 2011-02-09 DIAGNOSIS — D631 Anemia in chronic kidney disease: Secondary | ICD-10-CM | POA: Diagnosis present

## 2011-02-09 DIAGNOSIS — R1013 Epigastric pain: Secondary | ICD-10-CM

## 2011-02-09 DIAGNOSIS — N039 Chronic nephritic syndrome with unspecified morphologic changes: Secondary | ICD-10-CM | POA: Diagnosis present

## 2011-02-09 DIAGNOSIS — R5381 Other malaise: Secondary | ICD-10-CM | POA: Diagnosis not present

## 2011-02-09 DIAGNOSIS — I959 Hypotension, unspecified: Secondary | ICD-10-CM | POA: Diagnosis not present

## 2011-02-09 DIAGNOSIS — R12 Heartburn: Secondary | ICD-10-CM | POA: Diagnosis not present

## 2011-02-09 DIAGNOSIS — I120 Hypertensive chronic kidney disease with stage 5 chronic kidney disease or end stage renal disease: Secondary | ICD-10-CM | POA: Diagnosis present

## 2011-02-09 DIAGNOSIS — R000 Tachycardia, unspecified: Secondary | ICD-10-CM | POA: Diagnosis not present

## 2011-02-09 DIAGNOSIS — Z905 Acquired absence of kidney: Secondary | ICD-10-CM

## 2011-02-09 DIAGNOSIS — M4316 Spondylolisthesis, lumbar region: Secondary | ICD-10-CM

## 2011-02-09 DIAGNOSIS — E119 Type 2 diabetes mellitus without complications: Secondary | ICD-10-CM | POA: Diagnosis present

## 2011-02-09 DIAGNOSIS — K55059 Acute (reversible) ischemia of intestine, part and extent unspecified: Secondary | ICD-10-CM

## 2011-02-09 DIAGNOSIS — N2581 Secondary hyperparathyroidism of renal origin: Secondary | ICD-10-CM | POA: Diagnosis present

## 2011-02-09 HISTORY — PX: LAPAROTOMY: SHX154

## 2011-02-09 HISTORY — PX: PARTIAL COLECTOMY: SHX5273

## 2011-02-09 HISTORY — PX: COLOSTOMY: SHX63

## 2011-02-09 LAB — DIFFERENTIAL
Basophils Absolute: 0.2 10*3/uL — ABNORMAL HIGH (ref 0.0–0.1)
Lymphocytes Relative: 7 % — ABNORMAL LOW (ref 12–46)
Monocytes Relative: 11 % (ref 3–12)
Neutro Abs: 18.7 10*3/uL — ABNORMAL HIGH (ref 1.7–7.7)

## 2011-02-09 LAB — CBC
HCT: 20.3 % — ABNORMAL LOW (ref 36.0–46.0)
MCV: 82 fL (ref 78.0–100.0)
Platelets: 385 10*3/uL (ref 150–400)
Platelets: 424 10*3/uL — ABNORMAL HIGH (ref 150–400)
RDW: 15.5 % (ref 11.5–15.5)
RDW: 15.6 % — ABNORMAL HIGH (ref 11.5–15.5)
WBC: 23.7 10*3/uL — ABNORMAL HIGH (ref 4.0–10.5)
WBC: 19.4 10*3/uL — ABNORMAL HIGH (ref 4.0–10.5)

## 2011-02-09 LAB — BASIC METABOLIC PANEL
Chloride: 93 mEq/L — ABNORMAL LOW (ref 96–112)
GFR calc Af Amer: 7 mL/min — ABNORMAL LOW (ref >90)
Potassium: 4.1 mEq/L (ref 3.5–5.1)

## 2011-02-09 LAB — PREPARE RBC (CROSSMATCH)

## 2011-02-09 LAB — POCT I-STAT 7, (LYTES, BLD GAS, ICA,H+H)
Bicarbonate: 25.4 mEq/L — ABNORMAL HIGH (ref 20.0–24.0)
HCT: 24 % — ABNORMAL LOW (ref 36.0–46.0)
Hemoglobin: 8.2 g/dL — ABNORMAL LOW (ref 12.0–15.0)
pCO2 arterial: 35.7 mmHg (ref 35.0–45.0)
pH, Arterial: 7.459 — ABNORMAL HIGH (ref 7.350–7.400)
pO2, Arterial: 374 mmHg — ABNORMAL HIGH (ref 80.0–100.0)

## 2011-02-09 LAB — CULTURE, BLOOD (ROUTINE X 2)
Culture  Setup Time: 201211182218
Culture: NO GROWTH

## 2011-02-09 LAB — GLUCOSE, CAPILLARY: Glucose-Capillary: 120 mg/dL — ABNORMAL HIGH (ref 70–99)

## 2011-02-09 SURGERY — LAPAROTOMY, EXPLORATORY
Anesthesia: General

## 2011-02-09 SURGERY — LAPAROTOMY, EXPLORATORY
Anesthesia: General | Site: Abdomen | Laterality: Right | Wound class: Dirty or Infected

## 2011-02-09 MED ORDER — ALBUMIN HUMAN 5 % IV SOLN
INTRAVENOUS | Status: AC
  Filled 2011-02-09: qty 250

## 2011-02-09 MED ORDER — DIPHENHYDRAMINE HCL 12.5 MG/5ML PO ELIX
12.5000 mg | ORAL_SOLUTION | Freq: Four times a day (QID) | ORAL | Status: DC | PRN
  Administered 2011-02-11: 12.5 mg via ORAL
  Filled 2011-02-09: qty 5

## 2011-02-09 MED ORDER — ALBUMIN HUMAN 5 % IV SOLN
12.5000 g | Freq: Once | INTRAVENOUS | Status: AC
  Administered 2011-02-09: 12.5 g via INTRAVENOUS

## 2011-02-09 MED ORDER — ENOXAPARIN SODIUM 30 MG/0.3ML ~~LOC~~ SOLN
30.0000 mg | SUBCUTANEOUS | Status: DC
  Administered 2011-02-10 – 2011-02-13 (×4): 30 mg via SUBCUTANEOUS
  Filled 2011-02-09 (×5): qty 0.3

## 2011-02-09 MED ORDER — DIPHENHYDRAMINE HCL 50 MG/ML IJ SOLN
12.5000 mg | Freq: Four times a day (QID) | INTRAMUSCULAR | Status: DC | PRN
  Filled 2011-02-09: qty 1

## 2011-02-09 MED ORDER — HYDROMORPHONE HCL PF 1 MG/ML IJ SOLN
0.2500 mg | INTRAMUSCULAR | Status: DC | PRN
  Administered 2011-02-09 (×4): 0.5 mg via INTRAVENOUS

## 2011-02-09 MED ORDER — NALOXONE HCL 0.4 MG/ML IJ SOLN: 0.4000 mg | INTRAMUSCULAR | Status: DC | PRN

## 2011-02-09 MED ORDER — MIDAZOLAM HCL 2 MG/2ML IJ SOLN: 0.5000 mg | Freq: Once | INTRAMUSCULAR | Status: DC | PRN

## 2011-02-09 MED ORDER — ROCURONIUM BROMIDE 100 MG/10ML IV SOLN
INTRAVENOUS | Status: DC | PRN
  Administered 2011-02-09: 50 mg via INTRAVENOUS

## 2011-02-09 MED ORDER — GLYCOPYRROLATE 0.2 MG/ML IJ SOLN
INTRAMUSCULAR | Status: DC | PRN
  Administered 2011-02-09: .8 mg via INTRAVENOUS

## 2011-02-09 MED ORDER — PROPOFOL 10 MG/ML IV EMUL
INTRAVENOUS | Status: DC | PRN
  Administered 2011-02-09: 140 mg via INTRAVENOUS

## 2011-02-09 MED ORDER — DEXTROSE-NACL 5-0.9 % IV SOLN
INTRAVENOUS | Status: DC
  Administered 2011-02-09: 100 mL/h via INTRAVENOUS
  Administered 2011-02-10: 40 mL/h via INTRAVENOUS
  Administered 2011-02-10: 100 mL/h via INTRAVENOUS
  Administered 2011-02-11 – 2011-02-13 (×2): via INTRAVENOUS

## 2011-02-09 MED ORDER — ONDANSETRON HCL 4 MG/2ML IJ SOLN: 4.0000 mg | Freq: Four times a day (QID) | INTRAMUSCULAR | Status: DC | PRN

## 2011-02-09 MED ORDER — SUCCINYLCHOLINE CHLORIDE 20 MG/ML IJ SOLN
INTRAMUSCULAR | Status: DC | PRN
  Administered 2011-02-09: 100 mg via INTRAVENOUS

## 2011-02-09 MED ORDER — ENOXAPARIN SODIUM 40 MG/0.4ML ~~LOC~~ SOLN: 40.0000 mg | SUBCUTANEOUS | Status: DC

## 2011-02-09 MED ORDER — PIPERACILLIN-TAZOBACTAM IN DEX 2-0.25 GM/50ML IV SOLN
2.2500 g | Freq: Three times a day (TID) | INTRAVENOUS | Status: DC
  Administered 2011-02-09: 2.25 g via INTRAVENOUS
  Filled 2011-02-09 (×3): qty 50

## 2011-02-09 MED ORDER — PIPERACILLIN-TAZOBACTAM IN DEX 2-0.25 GM/50ML IV SOLN
2.2500 g | Freq: Three times a day (TID) | INTRAVENOUS | Status: DC
  Administered 2011-02-09 – 2011-02-22 (×35): 2.25 g via INTRAVENOUS
  Filled 2011-02-09 (×39): qty 50

## 2011-02-09 MED ORDER — PIPERACILLIN-TAZOBACTAM 3.375 G IVPB
3.3750 g | Freq: Three times a day (TID) | INTRAVENOUS | Status: DC
  Filled 2011-02-09 (×2): qty 50

## 2011-02-09 MED ORDER — SODIUM CHLORIDE 0.9 % IV SOLN: 500.0000 mg | INTRAVENOUS | Status: DC

## 2011-02-09 MED ORDER — SODIUM CHLORIDE 0.9 % IV SOLN: INTRAVENOUS | Status: DC

## 2011-02-09 MED ORDER — PHENYLEPHRINE HCL 10 MG/ML IJ SOLN
INTRAMUSCULAR | Status: DC | PRN
  Administered 2011-02-09 (×4): 80 ug via INTRAVENOUS

## 2011-02-09 MED ORDER — ONDANSETRON HCL 4 MG/2ML IJ SOLN: 4.0000 mg | Freq: Once | INTRAMUSCULAR | Status: DC | PRN

## 2011-02-09 MED ORDER — SODIUM CHLORIDE 0.9 % IV SOLN
INTRAVENOUS | Status: DC | PRN
  Administered 2011-02-09: 15:00:00 via INTRAVENOUS

## 2011-02-09 MED ORDER — SODIUM CHLORIDE 0.9 % IJ SOLN: 9.0000 mL | INTRAMUSCULAR | Status: DC | PRN

## 2011-02-09 MED ORDER — FENTANYL CITRATE 0.05 MG/ML IJ SOLN
INTRAMUSCULAR | Status: DC | PRN
  Administered 2011-02-09: 125 ug via INTRAVENOUS
  Administered 2011-02-09: 75 ug via INTRAVENOUS
  Administered 2011-02-09: 50 ug via INTRAVENOUS

## 2011-02-09 MED ORDER — PIPERACILLIN-TAZOBACTAM IN DEX 2-0.25 GM/50ML IV SOLN
2.2500 g | Freq: Three times a day (TID) | INTRAVENOUS | Status: DC
  Filled 2011-02-09: qty 50

## 2011-02-09 MED ORDER — NEOSTIGMINE METHYLSULFATE 1 MG/ML IJ SOLN
INTRAMUSCULAR | Status: DC | PRN
  Administered 2011-02-09: 5 mg via INTRAVENOUS

## 2011-02-09 MED ORDER — SODIUM CHLORIDE 0.9 % IR SOLN
Status: DC | PRN
  Administered 2011-02-09: 5000 mL

## 2011-02-09 MED ORDER — ONDANSETRON HCL 4 MG PO TABS: 4.0000 mg | ORAL_TABLET | Freq: Four times a day (QID) | ORAL | Status: DC | PRN

## 2011-02-09 MED ORDER — ONDANSETRON HCL 4 MG/2ML IJ SOLN
INTRAMUSCULAR | Status: DC | PRN
  Administered 2011-02-09: 4 mg via INTRAVENOUS

## 2011-02-09 MED ORDER — HYDROMORPHONE HCL PF 1 MG/ML IJ SOLN
INTRAMUSCULAR | Status: AC
  Administered 2011-02-09: 0.5 mg via INTRAVENOUS
  Filled 2011-02-09: qty 1

## 2011-02-09 MED ORDER — MORPHINE SULFATE (PF) 1 MG/ML IV SOLN
INTRAVENOUS | Status: DC
  Administered 2011-02-09: 12 mg via INTRAVENOUS
  Administered 2011-02-09: 21:00:00 via INTRAVENOUS
  Administered 2011-02-10 (×2): 4.5 mg via INTRAVENOUS
  Administered 2011-02-10: 3 mg via INTRAVENOUS
  Administered 2011-02-10: 09:00:00 via INTRAVENOUS
  Administered 2011-02-10: 10.5 mg via INTRAVENOUS
  Administered 2011-02-11: 15 mg via INTRAVENOUS
  Administered 2011-02-11: 1.5 mg via INTRAVENOUS
  Administered 2011-02-11: 7.5 mg via INTRAVENOUS
  Administered 2011-02-11: 4.05 mg via INTRAVENOUS
  Administered 2011-02-11: 19:00:00 via INTRAVENOUS
  Administered 2011-02-12: 1.5 mg via INTRAVENOUS
  Administered 2011-02-12: 03:00:00 via INTRAVENOUS
  Administered 2011-02-12: 4 mg via INTRAVENOUS
  Administered 2011-02-12: 12 mg via INTRAVENOUS
  Administered 2011-02-12: 15 mg via INTRAVENOUS
  Filled 2011-02-09 (×5): qty 25

## 2011-02-09 MED ORDER — SODIUM CHLORIDE 0.9 % IV SOLN
INTRAVENOUS | Status: DC | PRN
  Administered 2011-02-09: 16:00:00 via INTRAVENOUS

## 2011-02-09 SURGICAL SUPPLY — 39 items
BLADE SURG ROTATE 9660 (MISCELLANEOUS) IMPLANT
CANISTER SUCTION 2500CC (MISCELLANEOUS) ×3 IMPLANT
CLOTH BEACON ORANGE TIMEOUT ST (SAFETY) ×3 IMPLANT
COVER SURGICAL LIGHT HANDLE (MISCELLANEOUS) ×3 IMPLANT
DRAPE LAPAROSCOPIC ABDOMINAL (DRAPES) ×3 IMPLANT
DRAPE WARM FLUID 44X44 (DRAPE) ×3 IMPLANT
DRESSING TELFA 8X3 (GAUZE/BANDAGES/DRESSINGS) ×1 IMPLANT
ELECT REM PT RETURN 9FT ADLT (ELECTROSURGICAL) ×3
ELECTRODE REM PT RTRN 9FT ADLT (ELECTROSURGICAL) ×2 IMPLANT
GAUZE SPONGE 4X4 12PLY STRL LF (GAUZE/BANDAGES/DRESSINGS) ×1 IMPLANT
GLOVE SURG SIGNA 7.5 PF LTX (GLOVE) ×3 IMPLANT
GOWN PREVENTION PLUS XLARGE (GOWN DISPOSABLE) ×3 IMPLANT
GOWN STRL NON-REIN LRG LVL3 (GOWN DISPOSABLE) ×6 IMPLANT
KIT BASIN OR (CUSTOM PROCEDURE TRAY) ×3 IMPLANT
KIT ROOM TURNOVER OR (KITS) ×3 IMPLANT
LIGASURE IMPACT 36 18CM CVD LR (INSTRUMENTS) ×1 IMPLANT
NS IRRIG 1000ML POUR BTL (IV SOLUTION) ×3 IMPLANT
PACK GENERAL/GYN (CUSTOM PROCEDURE TRAY) ×3 IMPLANT
PAD ARMBOARD 7.5X6 YLW CONV (MISCELLANEOUS) ×3 IMPLANT
RELOAD PROXIMATE 75MM BLUE (ENDOMECHANICALS) ×6 IMPLANT
RELOAD STAPLE 75 3.8 BLU REG (ENDOMECHANICALS) IMPLANT
SPECIMEN JAR LARGE (MISCELLANEOUS) IMPLANT
SPONGE GAUZE 4X4 12PLY (GAUZE/BANDAGES/DRESSINGS) ×3 IMPLANT
SPONGE LAP 18X18 X RAY DECT (DISPOSABLE) IMPLANT
STAPLER PROXIMATE 75MM BLUE (STAPLE) ×1 IMPLANT
STAPLER VISISTAT 35W (STAPLE) ×3 IMPLANT
SUCTION POOLE TIP (SUCTIONS) IMPLANT
SUT PDS AB 1 TP1 96 (SUTURE) ×6 IMPLANT
SUT SILK 2 0 SH CR/8 (SUTURE) ×3 IMPLANT
SUT SILK 2 0 TIES 10X30 (SUTURE) ×3 IMPLANT
SUT SILK 3 0 SH CR/8 (SUTURE) ×3 IMPLANT
SUT SILK 3 0 TIES 10X30 (SUTURE) ×3 IMPLANT
SUT VIC AB 3-0 SH 18 (SUTURE) ×1 IMPLANT
TAPE HYPAFIX 6X30 (GAUZE/BANDAGES/DRESSINGS) ×1 IMPLANT
TOWEL OR 17X24 6PK STRL BLUE (TOWEL DISPOSABLE) ×3 IMPLANT
TOWEL OR 17X26 10 PK STRL BLUE (TOWEL DISPOSABLE) ×3 IMPLANT
TRAY FOLEY CATH 14FRSI W/METER (CATHETERS) IMPLANT
WATER STERILE IRR 1000ML POUR (IV SOLUTION) ×3 IMPLANT
YANKAUER SUCT BULB TIP NO VENT (SUCTIONS) IMPLANT

## 2011-02-09 NOTE — Transfer of Care (Signed)
Immediate Anesthesia Transfer of Care Note  Patient: [name]  Procedure(s) Performed:  EXPLORATORY LAPAROTOMY; PARTIAL COLECTOMY; COLOSTOMY  Patient Location: PACU  Anesthesia Type: General  Level of Consciousness: awake, alert  and oriented  Airway & Oxygen Therapy: Patient Spontanous Breathing and Patient connected to nasal cannula oxygen  Post-op Assessment: Report given to PACU RN, Post -op Vital signs reviewed and stable and Patient moving all extremities X 4  Post vital signs: Reviewed and stable  Complications: No apparent anesthesia complications

## 2011-02-09 NOTE — Progress Notes (Signed)
ANTIBIOTIC CONSULT NOTE - FOLLOW UP  Pharmacy Consult for Vancomycin and Zosyn Indication: Empiric for  sepsis  Allergies  Allergen Reactions  . Contrast Media (Iodinated Diagnostic Agents)     Seziure  . Iohexol Other (See Comments)    Reaction is convulsions    Patient Measurements:   Weight 55kg  Vital Signs: Temp: 99 F (37.2 C) (11/24 1934) Temp src: Oral (11/24 1934) BP: 119/68 mmHg (11/24 1834) Pulse Rate: 124  (11/24 1849) Intake/Output from previous day:   Intake/Output from this shift:    Labs:  Basename 02/09/11 1800 02/09/11 1621 02/09/11 0700 02/08/11 0630 02/07/11 0730  WBC 19.4* -- 23.7* 21.2* --  HGB 7.2* 8.2* 8.1* -- --  PLT 424* -- 385 344 --  LABCREA -- -- -- -- --  CREATININE 7.06* -- -- 5.78* 3.56*    Microbiology: Recent Results (from the past 720 hour(s))  SURGICAL PCR SCREEN     Status: Normal   Collection Time   01/25/11  9:10 AM      Component Value Range Status Comment   MRSA, PCR NEGATIVE  NEGATIVE  Final    Staphylococcus aureus NEGATIVE  NEGATIVE  Final   CULTURE, BLOOD (ROUTINE X 2)     Status: Normal   Collection Time   02/03/11  4:09 PM      Component Value Range Status Comment   Specimen Description BLOOD LEFT ARM   Final    Special Requests     Final    Value: BOTTLES DRAWN AEROBIC AND ANAEROBIC 8CC AER, [place] ANA   Setup Time [id number]   Final    Culture NO GROWTH 5 DAYS   Final    Report Status 02/09/2011 FINAL   Final   CULTURE, BLOOD (ROUTINE X 2)     Status: Normal   Collection Time   02/03/11  4:19 PM      Component Value Range Status Comment   Specimen Description BLOOD LEFT HAND   Final    Special Requests BOTTLES DRAWN AEROBIC ONLY [place]   Final    Setup Time [id number]   Final    Culture NO GROWTH 5 DAYS   Final    Report Status 02/09/2011 FINAL   Final   CULTURE, BLOOD (ROUTINE X 2)     Status: Normal (Preliminary result)   Collection Time   02/06/11  5:40 PM      Component Value Range Status Comment   Specimen Description BLOOD HEMODIALYSIS GRAFT   Final    Special Requests BOTTLES DRAWN AEROBIC AND ANAEROBIC 10CC   Final    Setup Time [id number]   Final    Culture     Final    Value:        BLOOD CULTURE RECEIVED NO GROWTH TO DATE CULTURE WILL BE HELD FOR 5 DAYS BEFORE ISSUING A FINAL NEGATIVE REPORT   Report Status PENDING   Incomplete   CULTURE, BLOOD (ROUTINE X 2)     Status: Normal (Preliminary result)   Collection Time   02/06/11  6:40 PM      Component Value Range Status Comment   Specimen Description BLOOD HEMODIALYSIS GRAFT   Final    Special Requests BOTTLES DRAWN AEROBIC AND ANAEROBIC 10CC   Final    Setup Time [id number]   Final    Culture     Final    Value:        BLOOD CULTURE RECEIVED NO GROWTH TO DATE CULTURE WILL BE HELD FOR  5 DAYS BEFORE ISSUING A FINAL NEGATIVE REPORT   Report Status PENDING   Incomplete     Anti-infectives     Start     Dose/Rate Route Frequency Ordered Stop   02/09/11 2100   piperacillin-tazobactam (ZOSYN) IVPB 3.375 g        3.375 g 12.5 mL/hr over 240 Minutes Intravenous Every 8 hours 02/09/11 2034            Assessment:  Ischemic colitis, s/p ex lap and colectomy:  On Day #4 of Vancomycin and Day #1 Zosyn empirically.  She received a  Vancomycin load 11/21 and has not received HD since that time.  Goal of Therapy:  Vancomycin trough level 10-15 mcg/ml  Plan:  Vancomycin 500mg  after each HD session (usual schedule TTS but not dialyzed today d/t surgery) Zosyn 2.25g IV q8hr Monitor for HD plans.  Legrand Como, Pharm.D., BCPS Clinical Pharmacist  Pager [phone] 02/09/2011, 8:48 PM

## 2011-02-09 NOTE — Consult Note (Signed)
Reason for Consult:abdominal pain, ischemic changes in colon on CT  Referring Physician: Dr. Lynford Citizen   HPI: [name] is an 60 y.o. female I have been asked to see Ms. Dickard after a CT can of the abdomen performed yesterday demonstrated right and transverse colon wall thickening and pneumatosis.  Patient reports that she has been having abdominal pain and constipation for the last two months since back surgery.  Had 2-3 BM's last 24 hrs with no blood or diarrhea.  Pain cramping in nature and mostly in epigastrium.  Has been eating  Past Medical History:  Past Medical History  Diagnosis Date  . Seizures     r/t HTN in 1990's x 1  . Brain aneurysm   . Blood transfusion 1990's    r/t Kidney removal surgery  . Arthritis     Back  . Anemia   . Umbilical hernia age 39  . Heart murmur     Born with heart mumur, does not require follow up per pt  . Hypertension     Does not see a heart doctor, had pre transplant stress test at [facility]    . Diabetes mellitus     Borderline  . End stage renal disease on dialysis     T/Th/Sat dialysis on [employer]  . End stage renal disease on dialysis     01-29-2011  . Hepatitis C     Surgical History:  Past Surgical History  Procedure Date  . Nephrectomy 2010    right side done at [facility] on transplant list  . Umbilical hernia repair age 37- 28  . Appendectomy 1960's  . Vascular surgery     right arm dialysis graft  . Tonsillectomy     as a child  . Rib resection     d/t kidney removal  . Back surgery 01/30/2011    laminectomy  . Hernia repair     Family History:  Family History  Problem Relation Age of Onset  . Anesthesia problems Neg Hx     Social History:  reports that she quit smoking about 5 months ago. Her smoking use included Cigarettes. She has a 24 pack-year smoking history. She has never used smokeless tobacco. She reports that she drinks about 1.2 ounces of alcohol per week. She reports that she does  not use illicit drugs.  Allergies:  Allergies  Allergen Reactions  . Contrast Media (Iodinated Diagnostic Agents)     Seziure  . Iohexol Other (See Comments)    Reaction is convulsions    Medications: I have reviewed the patient's current medications.  ROS: See HPI for pertinent findings, otherwise complete 10 system review negative.  Physical Exam: Blood pressure 119/73, pulse 102, temperature 98.7 F (37.1 C), temperature source Oral, resp. rate 19, height 5\' 5"  (1.651 m), weight 121 lb 7.6 oz (55.1 kg), SpO2 92.00%.  General Appearance:  Alert, cooperative, no distress, appears stated age  Head:  Normocephalic, without obvious abnormality, atraumatic  ENT: Unremarkable  Neck: Supple, symmetrical, trachea midline, no adenopathy, thyroid: not enlarged, symmetric, no tenderness/mass/nodules  Lungs:   Clear to auscultation bilaterally, no w/r/r, respirations unlabored without use of accessory muscles.  Chest Wall:  No tenderness or deformity  Heart:  Regular rate and rhythm, S1, S2 normal, no murmur, rub or gallop. Carotids 2+ without bruit.  Abdomen:   Soft, full.  Moderate tenderness greatest in epigastrium with guarding.  Lower and left abdomen are soft and non-tender.  No hernias  Genitalia:  Normal. No hernias  Rectal:  Deferred.  Extremities: Extremities normal, atraumatic, no cyanosis or edema Dialysis acces in upper arm     Skin: Skin color, texture, turgor normal, no rashes or lesions  Neurologic: Normal affect, no gross deficits.     Labs: CBC  Basename 02/09/11 0700 02/08/11 0630  WBC 23.7* 21.2*  HGB 8.1* 8.3*  HCT 23.3* 23.9*  PLT 385 344   MET  Basename 02/08/11 0630 02/07/11 0730  NA 138 135  K 3.8 3.4*  CL 93* 93*  CO2 28 29  GLUCOSE 70 106*  BUN 46* 24*  CREATININE 5.78* 3.56*  CALCIUM 8.4 8.5    Basename 02/08/11 0630  PROT --  ALBUMIN 2.1*  AST --  ALT --  ALKPHOS --  BILITOT --  BILIDIR --  IBILI --  LIPASE --   PT/INR No  results found for this basename: LABPROT:2,INR:2 in the last 72 hours ABG No results found for this basename: PHART:2,PCO2:2,PO2:2,HCO3:2 in the last 72 hours    Ct Abdomen Pelvis Wo Contrast  02/08/2011  *RADIOLOGY REPORT*  Clinical Data: Diffuse abdominal pain, leukocytosis, fever, constipation, back surgery 2 months ago  CT ABDOMEN AND PELVIS WITHOUT CONTRAST  Technique:  Multidetector CT imaging of the abdomen and pelvis was performed following the standard protocol without intravenous contrast. The patient drank dilute oral contrast for exam.  IV contrast not administered, patient with history of chronic renal insufficiency.  Comparison: None  Findings: Atelectasis at both lung bases. Atherosclerotic calcifications aorta and coronary arteries. Surgical absence of the right kidney. Numerous left renal cysts, largest 3.5 x 2.4 cm image 27. Liver, spleen, pancreas, and adrenal glands unremarkable. Scattered bowel wall thickening of the colon with pneumatosis at the right colon mostly, minimally in the transverse colon, suspicious for ischemic colitis. Dilatation of the right colon, 7.9 cm transverse diameter. Small bowel loops unremarkable. Beam hardening artifacts from orthopedic hardware L4-S1. Unremarkable uterus and ovaries. Bladder decompressed. Question mild rectal wall thickening as well. Fascial defect right lateral abdominal wall adjacent to right lobe liver without bowel herniation. No definite mass, adenopathy, free fluid, or free air.  IMPRESSION: Dilatation and bowel wall thickening of the ascending and transverse colon with scattered areas of pneumatosis, predominately ascending colon, suspicious for ischemic colitis. No evidence of bowel perforation or obstruction. Question mild associated rectal wall thickening may represent additional colitis. Extensive atherosclerotic disease. Bibasilar atelectasis. 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  Findings called to Dr. Jimmy Footman on 02/08/2011 at 1918 hours.  Original Report Authenticated By: Burnetta Sabin, M.D.   Dg Chest 2 View  02/08/2011  *RADIOLOGY REPORT*  Clinical Data: Leukocytosis  CHEST - 2 VIEW  Comparison: 02/03/2011  Findings:  There is a mild cardiac enlargement.  There is no pleural effusion or pulmonary edema.  No airspace consolidation identified.  Review of the visualized osseous structures is unremarkable.  IMPRESSION:  1.  No active cardiopulmonary abnormalities.  Original Report Authenticated By: Angelita Ingles, M.D.    Assessment/Plan: Patient with infectious vs ischemic colitis.  Very worrisome findings on CT scan and WBC is increasing.  Abdominal exam, however, without frank peritonitis.  Will continue bowel rest Add IV Zosyn Serial abdominal exams Check stool for c-diff. Exploratory Laparotomy if she decompensates We will follow her closely  Zyairah Wacha A 02/09/2011, 10:16 AM

## 2011-02-09 NOTE — H&P (View-Only) (Signed)
Patient ID: [name], female   DOB: Sep 12, 1950, 60 y.o.   MRN: [id number]  Patient feeling worse.  Worsening abdominal pain. Increasing tenderness in epigastrium and RLQ on abdominal exam  Needs Exploratory Laparotomy  I discussed this with the patient and her daughter in detail Risks of surgery including bleeding, infection, need for bowel resection, need for ostomy, anastomotic leak, cardiopulmonary issues, etc discussed in detail She agrees to proceed. Likelihood of successful outcome is tinuous

## 2011-02-09 NOTE — Anesthesia Postprocedure Evaluation (Signed)
  Anesthesia Post-op Note  Patient: [name]  Procedure(s) Performed:  EXPLORATORY LAPAROTOMY; PARTIAL COLECTOMY; COLOSTOMY  Patient Location: PACU and ICU  Anesthesia Type: General  Level of Consciousness: awake and oriented  Airway and Oxygen Therapy: Patient Spontanous Breathing and Patient connected to nasal cannula oxygen  Post-op Pain: moderate  Post-op Assessment: Patient's Cardiovascular Status Stable  Post-op Vital Signs: stable  Complications: No apparent anesthesia complications

## 2011-02-09 NOTE — Progress Notes (Signed)
Subjective: Interval History: has complaints stomach pain.  Objective: Vital signs in last 24 hours: Temp:  [98.7 F (37.1 C)-99 F (37.2 C)] 98.7 F (37.1 C) (11/24 0600) Pulse Rate:  [102-105] 102  (11/24 0600) Resp:  [19] 19  (11/24 0600) BP: (114-119)/(72-73) 119/73 mmHg (11/24 0600) SpO2:  [90 %-92 %] 92 % (11/24 0600) Weight:  [55.1 kg (121 lb 7.6 oz)] 121 lb 7.6 oz (55.1 kg) (11/24 0600) Weight change: 0.5 kg (1 lb 1.6 oz)  Intake/Output from previous day: 11/23 0701 - 11/24 0700 In: 1435 [P.O.:600; IV Piggyback:25] Out: 3 [Stool:3] Intake/Output this shift:    General appearance: alert, cooperative and moderate distress Resp: clear to auscultation bilaterally Cardio: S1, S2 normal, S4 present and systolic murmur: holosystolic 2/6, blowing at apex GI: abnormal findings:  distended and hypoactive bowel sounds and tympanitic, tender Extremities: RUA avf  Lab Results:  Basename 02/09/11 0700 02/08/11 0630  WBC 23.7* 21.2*  HGB 8.1* 8.3*  HCT 23.3* 23.9*  PLT 385 344   BMET:  Basename 02/08/11 0630 02/07/11 0730  NA 138 135  K 3.8 3.4*  CL 93* 93*  CO2 28 29  GLUCOSE 70 106*  BUN 46* 24*  CREATININE 5.78* 3.56*  CALCIUM 8.4 8.5   No results found for this basename: PTH:2 in the last 72 hours Iron Studies: No results found for this basename: IRON,TIBC,TRANSFERRIN,FERRITIN in the last 72 hours  Studies/Results: Ct Abdomen Pelvis Wo Contrast  02/08/2011  *RADIOLOGY REPORT*  Clinical Data: Diffuse abdominal pain, leukocytosis, fever, constipation, back surgery 2 months ago  CT ABDOMEN AND PELVIS WITHOUT CONTRAST  Technique:  Multidetector CT imaging of the abdomen and pelvis was performed following the standard protocol without intravenous contrast. The patient drank dilute oral contrast for exam.  IV contrast not administered, patient with history of chronic renal insufficiency.  Comparison: None  Findings: Atelectasis at both lung bases. Atherosclerotic  calcifications aorta and coronary arteries. Surgical absence of the right kidney. Numerous left renal cysts, largest 3.5 x 2.4 cm image 27. Liver, spleen, pancreas, and adrenal glands unremarkable. Scattered bowel wall thickening of the colon with pneumatosis at the right colon mostly, minimally in the transverse colon, suspicious for ischemic colitis. Dilatation of the right colon, 7.9 cm transverse diameter. Small bowel loops unremarkable. Beam hardening artifacts from orthopedic hardware L4-S1. Unremarkable uterus and ovaries. Bladder decompressed. Question mild rectal wall thickening as well. Fascial defect right lateral abdominal wall adjacent to right lobe liver without bowel herniation. No definite mass, adenopathy, free fluid, or free air.  IMPRESSION: Dilatation and bowel wall thickening of the ascending and transverse colon with scattered areas of pneumatosis, predominately ascending colon, suspicious for ischemic colitis. No evidence of bowel perforation or obstruction. Question mild associated rectal wall thickening may represent additional colitis. Extensive atherosclerotic disease. Bibasilar atelectasis. 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  Findings called to Dr. Jimmy Footman on 02/08/2011 at 1918 hours.  Original Report Authenticated By: Burnetta Sabin, M.D.   Dg Chest 2 View  02/08/2011  *RADIOLOGY REPORT*  Clinical Data: Leukocytosis  CHEST - 2 VIEW  Comparison: 02/03/2011  Findings:  There is a mild cardiac enlargement.  There is no pleural effusion or pulmonary edema.  No airspace consolidation identified.  Review of the visualized osseous structures is unremarkable.  IMPRESSION:  1.  No active cardiopulmonary abnormalities.  Original Report Authenticated By: Angelita Ingles, M.D.    I have reviewed the patient's current medications.  Assessment/Plan: 1 ESRD HD today 2 Colon suspect ischemic, ongoing, on AB bowel rest ,surgery F/U 3  HTN controlled 4 Anemia EPO 5 HPTH  p as above    LOS: 8 days   [name] L 02/09/2011,11:18 AM

## 2011-02-09 NOTE — Progress Notes (Signed)
Physical Therapy Note  Patient Details  Name: [name] MRN: [id number] Date of Birth: 1950-07-15 Today's Date: 02/09/2011  Pt. Missed 60 minute PT session secondary to pending surgery.   Yuto Cajuste,JIM 02/09/2011, 2:12 PM

## 2011-02-09 NOTE — Progress Notes (Signed)
Patient slightly lethargic but oriented x 4. Patient transferred to OR at approximately 1445 for surgery due to worsening abdominal pain and distension. Patient NPO this afternoon- first zosyn dose given. Patient did no have BM and unable to get C-Diff sample. Patient does not void. IV in left hand dated for 11/22. Incision to back healing well-no pus noted. Patient given CHG bath before going down to OR. Patient has not had scheduled tramadol this afternoon due to lethargy. Coralie Keens MD had discussed with patient and daughter in room about procedure.

## 2011-02-09 NOTE — Progress Notes (Signed)
Patient ID: [name], female   DOB: 1951-02-19, 60 y.o.   MRN: [id number]  Patient feeling worse.  Worsening abdominal pain. Increasing tenderness in epigastrium and RLQ on abdominal exam  Needs Exploratory Laparotomy  I discussed this with the patient and her daughter in detail Risks of surgery including bleeding, infection, need for bowel resection, need for ostomy, anastomotic leak, cardiopulmonary issues, etc discussed in detail She agrees to proceed. Likelihood of successful outcome is tinuous

## 2011-02-09 NOTE — Progress Notes (Signed)
Dr. Ninfa Linden aware of Hgb 7.2. Also aware of need for orders (none carried over from what was written this morning from 4000 (rehab.)) Also Dr. Florene Glen aware of need for new orders for dialysis to be written (did not carry over from this morning) Per Dr. Florene Glen, will wait until AM and reassess then. Labs read to Dr. Florene Glen. Patient stable at this time. Will continue to closely monitor.

## 2011-02-09 NOTE — Anesthesia Procedure Notes (Signed)
Procedures

## 2011-02-09 NOTE — Progress Notes (Addendum)
ANTIBIOTIC CONSULT NOTE - FOLLOW UP  Pharmacy Consult for Vanco/Fortaz Indication: Empiric for  sepsis  Allergies  Allergen Reactions  . Contrast Media (Iodinated Diagnostic Agents)     Seziure  . Iohexol Other (See Comments)    Reaction is convulsions    Patient Measurements: Height: 5\' 5"  (165.1 cm) Weight: 121 lb 7.6 oz (55.1 kg) IBW/kg (Calculated) : 57  Adjusted Body Weight:    Vital Signs: Temp: 98.7 F (37.1 C) (11/24 0600) Temp src: Oral (11/24 0600) BP: 119/73 mmHg (11/24 0600) Pulse Rate: 102  (11/24 0600) Intake/Output from previous day: 11/23 0701 - 11/24 0700 In: 1435 [P.O.:600; IV Piggyback:25] Out: 3 [Stool:3] Intake/Output from this shift:    Labs:  Basename 02/09/11 0700 02/08/11 0630 02/07/11 0730  WBC 23.7* 21.2* 15.9*  HGB 8.1* 8.3* 8.6*  PLT 385 344 297  LABCREA -- -- --  CREATININE -- 5.78* 3.56*   Estimated Creatinine Clearance: 9 ml/min (by C-G formula based on Cr of 5.78). No results found for this basename: VANCOTROUGH:2,VANCOPEAK:2,VANCORANDOM:2,GENTTROUGH:2,GENTPEAK:2,GENTRANDOM:2,TOBRATROUGH:2,TOBRAPEAK:2,TOBRARND:2,AMIKACINPEAK:2,AMIKACINTROU:2,AMIKACIN:2, in the last 72 hours   Microbiology: Recent Results (from the past 720 hour(s))  SURGICAL PCR SCREEN     Status: Normal   Collection Time   01/25/11  9:10 AM      Component Value Range Status Comment   MRSA, PCR NEGATIVE  NEGATIVE  Final    Staphylococcus aureus NEGATIVE  NEGATIVE  Final   CULTURE, BLOOD (ROUTINE X 2)     Status: Normal   Collection Time   02/03/11  4:09 PM      Component Value Range Status Comment   Specimen Description BLOOD LEFT ARM   Final    Special Requests     Final    Value: BOTTLES DRAWN AEROBIC AND ANAEROBIC 8CC AER, [place] ANA   Setup Time [id number]   Final    Culture NO GROWTH 5 DAYS   Final    Report Status 02/09/2011 FINAL   Final   CULTURE, BLOOD (ROUTINE X 2)     Status: Normal   Collection Time   02/03/11  4:19 PM      Component Value  Range Status Comment   Specimen Description BLOOD LEFT HAND   Final    Special Requests BOTTLES DRAWN AEROBIC ONLY [place]   Final    Setup Time [id number]   Final    Culture NO GROWTH 5 DAYS   Final    Report Status 02/09/2011 FINAL   Final   CULTURE, BLOOD (ROUTINE X 2)     Status: Normal (Preliminary result)   Collection Time   02/06/11  5:40 PM      Component Value Range Status Comment   Specimen Description BLOOD HEMODIALYSIS GRAFT   Final    Special Requests BOTTLES DRAWN AEROBIC AND ANAEROBIC 10CC   Final    Setup Time [id number]   Final    Culture     Final    Value:        BLOOD CULTURE RECEIVED NO GROWTH TO DATE CULTURE WILL BE HELD FOR 5 DAYS BEFORE ISSUING A FINAL NEGATIVE REPORT   Report Status PENDING   Incomplete   CULTURE, BLOOD (ROUTINE X 2)     Status: Normal (Preliminary result)   Collection Time   02/06/11  6:40 PM      Component Value Range Status Comment   Specimen Description BLOOD HEMODIALYSIS GRAFT   Final    Special Requests BOTTLES DRAWN AEROBIC AND ANAEROBIC 10CC   Final  Setup Time [id number]   Final    Culture     Final    Value:        BLOOD CULTURE RECEIVED NO GROWTH TO DATE CULTURE WILL BE HELD FOR 5 DAYS BEFORE ISSUING A FINAL NEGATIVE REPORT   Report Status PENDING   Incomplete     Anti-infectives     Start     Dose/Rate Route Frequency Ordered Stop   02/09/11 1200   vancomycin (VANCOCIN) 500 mg in sodium chloride 0.9 % 100 mL IVPB  Status:  Discontinued        500 mg 100 mL/hr over 60 Minutes Intravenous Every T-Th-Sa (Hemodialysis) 02/06/11 1746 02/07/11 1001   02/09/11 1200   cefTAZidime (FORTAZ) 2 g in dextrose 5 % 50 mL IVPB  Status:  Discontinued        2 g 100 mL/hr over 30 Minutes Intravenous Every T-Th-Sa (Hemodialysis) 02/06/11 1746 02/07/11 1001   02/09/11 1200   vancomycin (VANCOCIN) 500 mg in sodium chloride 0.9 % 100 mL IVPB        500 mg 100 mL/hr over 60 Minutes Intravenous Every T-Th-Sa (Hemodialysis) 02/08/11 0833      02/09/11 1200   cefTAZidime (FORTAZ) 2 g in dextrose 5 % 50 mL IVPB        2 g 100 mL/hr over 30 Minutes Intravenous Every T-Th-Sa (Hemodialysis) 02/08/11 0833     02/08/11 1200   cefTAZidime (FORTAZ) 2 g in dextrose 5 % 50 mL IVPB  Status:  Discontinued        2 g 100 mL/hr over 30 Minutes Intravenous Every M-W-F (Hemodialysis) 02/07/11 1001 02/08/11 0833   02/08/11 1200   vancomycin (VANCOCIN) 500 mg in sodium chloride 0.9 % 100 mL IVPB  Status:  Discontinued        500 mg 100 mL/hr over 60 Minutes Intravenous Every M-W-F (Hemodialysis) 02/07/11 1001 02/08/11 0833   02/06/11 1830   vancomycin (VANCOCIN) IVPB 1000 mg/200 mL premix        1,000 mg 200 mL/hr over 60 Minutes Intravenous To Hemodialysis 02/06/11 1746 02/06/11 2010   02/06/11 1830   cefTAZidime (FORTAZ) 2 g in dextrose 5 % 50 mL IVPB        2 g 100 mL/hr over 30 Minutes Intravenous To Hemodialysis 02/06/11 1746 02/06/11 2034          Assessment: Vanco/Fortaz #4 Tmax 99 with WBC continuing to increase to 23.7 today. 11/18 BC x 2 negative with 11/21 blood cultures pending. Doses of abx are appropriate for ESRD. Last HD 11/21. Next HD today?  Infectious vs. Ischemic colitis. Add Zosyn for coverage.  Goal of Therapy:  Vancomycin trough level 10-15 mcg/ml  Plan:  Vanco 500mg  TTS Fortaz 2g TTS Zosyn 2.25g IV q8hr HD dosing   Alford Highland, Mark Hassey Stillinger 02/09/2011,10:05 AM

## 2011-02-09 NOTE — Anesthesia Preprocedure Evaluation (Addendum)
Anesthesia Evaluation  Patient identified by MRN, date of birth, ID band Patient awake    Reviewed: Allergy & Precautions, H&P , NPO status , Patient's Chart, lab work & pertinent test results, reviewed documented beta blocker date and time   History of Anesthesia Complications Negative for: history of anesthetic complications  Airway Mallampati: I TM Distance: >3 FB Neck ROM: Full    Dental  (+) Teeth Intact and Dental Advisory Given   Pulmonary former smoker clear to auscultation        Cardiovascular hypertension, Regular Normal    Neuro/Psych    GI/Hepatic (+) Hepatitis -, C  Endo/Other  Diabetes mellitus-  Renal/GU Dialysis and ESRFRenal disease     Musculoskeletal   Abdominal   Peds  Hematology   Anesthesia Other Findings   Reproductive/Obstetrics                          Anesthesia Physical Anesthesia Plan  ASA: III and Emergent  Anesthesia Plan: General   Post-op Pain Management:    Induction: Intravenous  Airway Management Planned: Oral ETT  Additional Equipment: Arterial line and CVP  Intra-op Plan:   Post-operative Plan: Extubation in OR  Informed Consent: I have reviewed the patients History and Physical, chart, labs and discussed the procedure including the risks, benefits and alternatives for the proposed anesthesia with the patient or authorized representative who has indicated his/her understanding and acceptance.   Dental advisory given  Plan Discussed with: CRNA and Surgeon  Anesthesia Plan Comments: (82 /yo female 1. Probable ischemic colitis 2. ESRD on HD for many years, last HD 11/22, K+ 3.8 on 11/23 3. Hep C. 4. H/O htn 5. Lumbar spinal stenosis, S/P Lumbar Decompression 11/14 6. Anemia H/H: 8.1/23  Plan Ga with Central line, art line to to recheck electrolytes and draw Type and Cross.  Roberts Gaudy, MD  Plan GA  )     Anesthesia Quick  Evaluation

## 2011-02-09 NOTE — Op Note (Signed)
02/01/2011 - 02/09/2011  5:02 PM  PATIENT:  [name]  60 y.o. female  PRE-OPERATIVE DIAGNOSIS:  ischemic colon  POST-OPERATIVE DIAGNOSIS:  ischemic colon  PROCEDURE:  Procedure(s): EXPLORATORY LAPAROTOMY, EXTENDED RIGHT HEMICOLECTOMY  SURGEON:  Surgeon(s): Harl Bowie, MD Shann Medal, MD  PHYSICIAN ASSISTANT:   ASSISTANTS:  DR. Alphonsa Overall  ANESTHESIA:   general  EBL:  Total I/O In: 1500 [I.V.:1500] Out: 300 [Blood:300]  BLOOD ADMINISTERED:none  DRAINS: none   LOCAL MEDICATIONS USED:  NONE  SPECIMEN:  Excision right colon  DISPOSITION OF SPECIMEN:  PATHOLOGY  COUNTS:  YES  TOURNIQUET:  * No tourniquets in log *  DICTATION: .Dragon Dictation The patient was brought to the operating room and identified as the correct patient. She was placed on the operating table in a. Her abdomen was prepped and draped in the usual sterile fashion. A #10 blade a midline incision was then created. This carried down to the fascia the cautery. The peritoneum wasn't opened the entirely of the incision. Upon entering the abdomen the patient was not have a mild amount of ascites. The right colon was distended. There are very small ischemic areas evident on the bowel wall. There was an odor in the abdomen. At this point I mobilized the right colon along the white line of T[place]t. There were a lot of adhesions on the right colon secondary to previous right nephrectomy. I was able to then take down the hepatic flexure. I transected the distal ileum with the GIA-75 stapler. I then transected the mid to distal transverse colon with the GIA-75 stapler as well. The mesentery was then taken down with the LigaSure as well as several 2-0 silk sutures and ties. The specimen was entered slightly and there was leakage of stool during the resection. The specimen was then removed and sent to pathology for evaluation. I then thoroughly irrigated the abdomen with several liters of normal saline.  Hemostasis appeared to be achieved. I made a separate incision the patient's right lower quadrant. I took this down to the fascia which was then entered in a cruciate fashion. The underlying muscle then separated the peritoneum was opened. I pulled out the distal ileum here as an ileostomy. Given the extent of the disease of the colon I elected not to perform an anastomosis. The patient's midline fascia is then closed with running #1 looped PDS suture. I closed skin with skin staples and place several wicks in between the sutures. I then opened up ileum just proximal to the staple line with a cautery. I then matured the circumferentially with interrupted 3-0 Vicryl sutures. Prior to closing the abdomen I did place several silk sutures at the ostomy site as well. The ileostomy appeared pink and well perfused. An ostomy appliance was applied. The patient tolerated the procedure well. All counts were correct at the end of the procedure. The patient was then extubated in the operating room and taken in a stable condition to the recovery room. PLAN OF CARE: Admit to inpatient   PATIENT DISPOSITION:  PACU - hemodynamically stable.   Delay start of Pharmacological VTE agent (>24hrs) due to surgical blood loss or risk of bleeding:  {YES/NO/NOT APPLICABLE:20182

## 2011-02-09 NOTE — Interval H&P Note (Signed)
History and Physical Interval Note:   02/09/2011   1:46 PM   [name]  has presented today for surgery, with the diagnosis of * No pre-op diagnosis entered *  The various methods of treatment have been discussed with the patient and family. After consideration of risks, benefits and other options for treatment, the patient has consented to  Procedure(s): EXPLORATORY LAPAROTOMY as a surgical intervention .  The patients' history has been reviewed, patient examined, no change in status, stable for surgery.  I have reviewed the patients' chart and labs.  Questions were answered to the patient's satisfaction.     Coralie Keens A  MD

## 2011-02-09 NOTE — Progress Notes (Signed)
Patient transferred to acute from rehab. Care plan and ed sheets goals not met at this time,

## 2011-02-09 NOTE — Progress Notes (Signed)
Patient ID: [name], female   DOB: Jul 19, 1950, 60 y.o.   MRN: [id number] Patient ID: [name], female   DOB: [birth year], 60 y.o.   MRN: [id number] Patient ID: [name], female   DOB: [birth year], 60 y.o.   MRN: [id number] Patient ID: [name], female   DOB: April 11, 1950, 60 y.o.   MRN: [id number]  Subjective/Complaints: Constipation and pain in back.  Unfortunately when she receives pain mediicne nursing is reporting that she is becoming lethargic.  Nausea and reflux  Objective: Vital Signs: Blood pressure 119/73, pulse 102, temperature 98.7 F (37.1 C), temperature source Oral, resp. rate 19, height 5\' 5"  (1.651 m), weight 55.1 kg (121 lb 7.6 oz), SpO2 92.00%. Ct Abdomen Pelvis Wo Contrast  02/08/2011  *RADIOLOGY REPORT*  Clinical Data: Diffuse abdominal pain, leukocytosis, fever, constipation, back surgery 2 months ago  CT ABDOMEN AND PELVIS WITHOUT CONTRAST  Technique:  Multidetector CT imaging of the abdomen and pelvis was performed following the standard protocol without intravenous contrast. The patient drank dilute oral contrast for exam.  IV contrast not administered, patient with history of chronic renal insufficiency.  Comparison: None  Findings: Atelectasis at both lung bases. Atherosclerotic calcifications aorta and coronary arteries. Surgical absence of the right kidney. Numerous left renal cysts, largest 3.5 x 2.4 cm image 27. Liver, spleen, pancreas, and adrenal glands unremarkable. Scattered bowel wall thickening of the colon with pneumatosis at the right colon mostly, minimally in the transverse colon, suspicious for ischemic colitis. Dilatation of the right colon, 7.9 cm transverse diameter. Small bowel loops unremarkable. Beam hardening artifacts from orthopedic hardware L4-S1. Unremarkable uterus and ovaries. Bladder decompressed. Question mild rectal wall thickening as well. Fascial defect right lateral abdominal wall adjacent to right lobe liver without bowel  herniation. No definite mass, adenopathy, free fluid, or free air.  IMPRESSION: Dilatation and bowel wall thickening of the ascending and transverse colon with scattered areas of pneumatosis, predominately ascending colon, suspicious for ischemic colitis. No evidence of bowel perforation or obstruction. Question mild associated rectal wall thickening may represent additional colitis. Extensive atherosclerotic disease. Bibasilar atelectasis. 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  Findings called to Dr. Jimmy Footman on 02/08/2011 at 1918 hours.  Original Report Authenticated By: Burnetta Sabin, M.D.   Dg Chest 2 View  02/08/2011  *RADIOLOGY REPORT*  Clinical Data: Leukocytosis  CHEST - 2 VIEW  Comparison: 02/03/2011  Findings:  There is a mild cardiac enlargement.  There is no pleural effusion or pulmonary edema.  No airspace consolidation identified.  Review of the visualized osseous structures is unremarkable.  IMPRESSION:  1.  No active cardiopulmonary abnormalities.  Original Report Authenticated By: Angelita Ingles, M.D.   Dg Abd 2 Views  02/07/2011  *RADIOLOGY REPORT*  Clinical Data: Abdominal distention and fever.  Elevated white blood cell count.  Status post lumbar surgery 01/30/2011.  ABDOMEN - 2 VIEW  Comparison: Plain films lumbar spine 10/26/2010 reviewed.  Findings: There is gaseous distention of large bowel with air-fluid levels present.  No small bowel distention is identified.  Large volume stool is present in the ascending colon.  Basilar atelectasis is noted.  Postoperative change of lower lumbar fusion is seen.  IMPRESSION:  1.  Distention of small bowel air-fluid levels most compatible with colonic ileus. 2.  Bibasilar atelectasis.  Original Report Authenticated By: Arvid Right. Luther Parody, M.D.    Basename 02/09/11 0700 02/08/11 0630  WBC 23.7* 21.2*  HGB 8.1*  8.3*  HCT 23.3* 23.9*  PLT 385 344    Basename 02/08/11 0630 02/07/11  0730  NA 138 135  K 3.8 3.4*  CL 93* 93*  CO2 28 29  GLUCOSE 70 106*  BUN 46* 24*  CREATININE 5.78* 3.56*  CALCIUM 8.4 8.5   CBG (last 3)  No results found for this basename: GLUCAP:3 in the last 72 hours  Wt Readings from Last 3 Encounters:  02/09/11 55.1 kg (121 lb 7.6 oz)  01/31/11 60.2 kg (132 lb 11.5 oz)  01/31/11 60.2 kg (132 lb 11.5 oz)    Physical Exam:  General appearance: alert Head: Normocephalic, without obvious abnormality, atraumatic Eyes: conjunctivae/corneas clear. PERRL, EOM's intact. Fundi benign. Ears: normal TM's and external ear canals both ears Nose: Nares normal. Septum midline. Mucosa normal. No drainage or sinus tenderness. Throat: lips, mucosa, and tongue normal; teeth and gums normal Neck: no adenopathy, no carotid bruit, no JVD, supple, symmetrical, trachea midline and thyroid not enlarged, symmetric, no tenderness/mass/nodules Back: symmetric, no curvature. ROM normal. No CVA tenderness. Resp: clear to auscultation bilaterally Cardio: regular rate and rhythm, S1, S2 normal, no murmur, click, rub or gallop GI: Distended tender +tympany Extremities: extremities normal, atraumatic, no cyanosis or edema Pulses: 2+ and symmetric Skin: Skin color, texture, turgor normal. No rashes or lesions Neurologic: bilateral foot drop and sensory loss in the L4 and L5 dermatomes Incision/Wound: clean and intact without drainage.   Assessment/Plan: 1. Functional deficits secondary to lumbar stenosis and radiculopathy s/p lami and fusion which require 3+ hours per day of interdisciplinary therapy in a comprehensive inpatient rehab setting. Physiatrist is providing close team supervision and 24 hour management of active medical problems listed below. Physiatrist and rehab team continue to assess barriers to discharge/monitor patient progress toward functional and medical goals.   PRAFO's for bilateral LE's worn last night.  Mobility: Bed Mobility Bed Mobility:  Yes Rolling Left: 5: Supervision;With rail Left Sidelying to Sit: 5: Supervision;With rails Left Sidelying to Sit Details (indicate cue type and reason): educated on log roll for back precautions Transfers Transfers: Yes Sit to Stand: 3: Mod assist;With upper extremity assist;From bed Sit to Stand Details (indicate cue type and reason): vc and assist with foot placement Stand to Sit: 3: Mod assist;With armrests Stand to Sit Details: vc for hand placement Stand Pivot Transfers: 3: Mod assist (rw) Stand Pivot Transfer Details (indicate cue type and reason): vc for walker placement and hip extension Squat Pivot Transfers: 4: Min assist Ambulation/Gait Ambulation/Gait Assistance: 3: Mod assist Ambulation/Gait Assistance Details (indicate cue type and reason): ACE wraps used for dorsiflexion. VC needed for hip extension , foot placement, and walker position especially with turning Ambulation Distance (Feet): 15 Feet (5' x 1) Assistive device: Rolling walker Gait Pattern: Decreased dorsiflexion - right;Decreased dorsiflexion - left;Scissoring;Trunk flexed (left knee hyperextension) Wheelchair Mobility Wheelchair Mobility: Yes Wheelchair Assistance: 5: Careers information officer: Both upper extremities Wheelchair Parts Management: Needs assistance Distance: 70 ADL:   Cognition: Cognition Overall Cognitive Status: Appears within functional limits for tasks assessed Arousal/Alertness: Awake/alert Orientation Level: Oriented X4 Cognition Arousal/Alertness: Awake/alert Orientation Level: Oriented X4  Medical Problem List and Plan:  1.Lumbar stenosis with radiculopathy.Lumbar 4-5 and L5-S1 Laminectomy and fusion 11/14  2. DVT Prophylaxis/Anticoagulation: SCD'S, consider sq lovenox   3. Pain Management: Will continue with hydrocodone.  Neurontin stopped; Pain a little better today.  Explained to the patient that we need to balance pain relief with her neuro/arousal status. Scheduled  norco before therapies was initiated  yesterday.  -I DON'T WANT HER RECEIVING DILAUDID GIVEN RECENT HX OF NARC INTOLERANCE!  4.ESRD-dialysis per [employer] .Weigh daily.Follow up labs.Strict i/o;s. Schedule HD post therapy day so as not to interfere with schedule and patient progress.   5.HTN-lopressor.Monitor with increased activity. She is normotensive today. Will hold metoprolol for SBP< 90.   6. Mood:Ego support by team.. She is in good spirits at present.   7.Acute on chronic anemia: hgb stable. Intervention per [facility].   8. Constipation:  Augment bowel regimen. Moved bowels yesterday.  9. Fever: w/u negative.  Low grade temp last night.   Wound reinspected today and looks good. Continue to closely monitor.  Started empirically on Fortaz and vanc per Nephrology  10. Probable paralytic ileus vs ischemic colitis give PPI and having occ liquid stool d/creglan    Spoke to Dr. Ninfa Linden from Gen Surg who will eval today   Leyna Vanderkolk E 02/09/2011, 7:47 AM

## 2011-02-10 ENCOUNTER — Encounter: Payer: Self-pay | Admitting: Nephrology

## 2011-02-10 DIAGNOSIS — E871 Hypo-osmolality and hyponatremia: Secondary | ICD-10-CM

## 2011-02-10 LAB — BASIC METABOLIC PANEL
Calcium: 7.4 mg/dL — ABNORMAL LOW (ref 8.4–10.5)
GFR calc non Af Amer: 5 mL/min — ABNORMAL LOW (ref >90)
Glucose, Bld: 149 mg/dL — ABNORMAL HIGH (ref 70–99)
Sodium: 134 mEq/L — ABNORMAL LOW (ref 135–145)

## 2011-02-10 LAB — GLUCOSE, CAPILLARY: Glucose-Capillary: 111 mg/dL — ABNORMAL HIGH (ref 70–99)

## 2011-02-10 LAB — CBC
MCH: 28.9 pg (ref 26.0–34.0)
Platelets: 471 10*3/uL — ABNORMAL HIGH (ref 150–400)
RBC: 2.35 MIL/uL — ABNORMAL LOW (ref 3.87–5.11)
RDW: 15.9 % — ABNORMAL HIGH (ref 11.5–15.5)

## 2011-02-10 NOTE — Progress Notes (Signed)
Subjective: Pt is status post extended R hemicolectomy with ileostomy for ischemic colitis.  She is hitting the PCA for pain.  Receiving blood.  Did not get dialyzed yesterday.    Objective: Vital signs in last 24 hours: Temp:  [98.2 F (36.8 C)-101 F (38.3 C)] 98.2 F (36.8 C) (11/25 0708) Pulse Rate:  [112-131] 114  (11/25 0900) Resp:  [11-31] 15  (11/25 0900) BP: (52-163)/(16-75) 136/53 mmHg (11/25 0900) SpO2:  [89 %-100 %] 99 % (11/25 0900) Arterial Line BP: (83-149)/(44-60) 92/50 mmHg (11/25 0900) Last BM Date: 02/09/11 (stool from rectum present on admission to unit)  Intake/Output from previous day: 11/24 0701 - 11/25 0700 In: 1364.8 [P.O.:10; I.V.:916.5; Blood:308.3; NG/GT:30; IV Piggyback:100] Out: 1 [Stool:1] Intake/Output this shift: Total I/O In: 370 [I.V.:340; NG/GT:30] Out: 0   General appearance: sluggish, but arousable and oriented.   Resp: clear to auscultation bilaterally Chest wall: no tenderness GI: abnormal findings:  appropriately tender and dressing intact Ostomy pink  Lab Results:   Basename 02/10/11 0315 02/09/11 1800  WBC 20.5* 19.4*  HGB 6.8* 7.2*  HCT 19.1* 20.3*  PLT 471* 424*   BMET  Basename 02/10/11 0315 02/09/11 1800  NA 134* 133*  K 4.9 4.1  CL 94* 93*  CO2 21 20  GLUCOSE 149* 77  BUN 70* 63*  CREATININE 7.52* 7.06*  CALCIUM 7.4* 7.4*   PT/INR No results found for this basename: LABPROT:2,INR:2 in the last 72 hours ABG  Basename 02/09/11 1621  PHART 7.459*  HCO3 25.4*    Studies/Results: Ct Abdomen Pelvis Wo Contrast  02/08/2011  *RADIOLOGY REPORT*  Clinical Data: Diffuse abdominal pain, leukocytosis, fever, constipation, back surgery 2 months ago  CT ABDOMEN AND PELVIS WITHOUT CONTRAST  Technique:  Multidetector CT imaging of the abdomen and pelvis was performed following the standard protocol without intravenous contrast. The patient drank dilute oral contrast for exam.  IV contrast not administered, patient with  history of chronic renal insufficiency.  Comparison: None  Findings: Atelectasis at both lung bases. Atherosclerotic calcifications aorta and coronary arteries. Surgical absence of the right kidney. Numerous left renal cysts, largest 3.5 x 2.4 cm image 27. Liver, spleen, pancreas, and adrenal glands unremarkable. Scattered bowel wall thickening of the colon with pneumatosis at the right colon mostly, minimally in the transverse colon, suspicious for ischemic colitis. Dilatation of the right colon, 7.9 cm transverse diameter. Small bowel loops unremarkable. Beam hardening artifacts from orthopedic hardware L4-S1. Unremarkable uterus and ovaries. Bladder decompressed. Question mild rectal wall thickening as well. Fascial defect right lateral abdominal wall adjacent to right lobe liver without bowel herniation. No definite mass, adenopathy, free fluid, or free air.  IMPRESSION: Dilatation and bowel wall thickening of the ascending and transverse colon with scattered areas of pneumatosis, predominately ascending colon, suspicious for ischemic colitis. No evidence of bowel perforation or obstruction. Question mild associated rectal wall thickening may represent additional colitis. Extensive atherosclerotic disease. Bibasilar atelectasis. 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  Findings called to Dr. Jimmy Footman on 02/08/2011 at 1918 hours.  Original Report Authenticated By: Burnetta Sabin, M.D.   Dg Chest 2 View  02/08/2011  *RADIOLOGY REPORT*  Clinical Data: Leukocytosis  CHEST - 2 VIEW  Comparison: 02/03/2011  Findings:  There is a mild cardiac enlargement.  There is no pleural effusion or pulmonary edema.  No airspace consolidation identified.  Review of the visualized osseous structures is unremarkable.  IMPRESSION:  1.  No active cardiopulmonary  abnormalities.  Original Report Authenticated By: Angelita Ingles, M.D.   Dg Chest Port 1 View  02/09/2011   *RADIOLOGY REPORT*  Clinical Data: Central line placement.  PORTABLE CHEST - 1 VIEW  Comparison: 1 day prior  Findings: A right IJ central line terminates at the caval/atrial junction. No pneumothorax.  Nasogastric tube extends beyond the  inferior aspect of the film. Surgical changes in the right upper abdomen.  Mild cardiomegaly. Cannot exclude trace bilateral pleural effusions. No congestive failure.  Decreased bibasilar aeration with patchy airspace disease.  IMPRESSION:  1.  Right IJ central line, terminating at caval atrial junction, without pneumothorax. 2.  Developing bibasilar airspace disease, favor atelectasis. 3.  Cannot exclude small bilateral pleural effusions.  Original Report Authenticated By: Areta Haber, M.D.    Anti-infectives: Anti-infectives     Start     Dose/Rate Route Frequency Ordered Stop   02/12/11 1200   vancomycin (VANCOCIN) 500 mg in sodium chloride 0.9 % 100 mL IVPB        500 mg 100 mL/hr over 60 Minutes Intravenous Every T-Th-Sa (Hemodialysis) 02/09/11 2050     02/09/11 2200  piperacillin-tazobactam (ZOSYN) IVPB 2.25 g       2.25 g 100 mL/hr over 30 Minutes Intravenous 3 times per day 02/09/11 2050     02/09/11 2100   piperacillin-tazobactam (ZOSYN) IVPB 3.375 g  Status:  Discontinued        3.375 g 12.5 mL/hr over 240 Minutes Intravenous Every 8 hours 02/09/11 2034 02/09/11 2050          Assessment/Plan: s/p r hemicolectomy for ischemic colon  Incentive spirometry Renal input regarding dialysis Continue zosyn/vanc for 3-5 days Continue blood transfusion Sips of clears WOCN consult Keep in ICU for mild intermittent hypotension, especially in the setting of possible dialysis today.   LOS: 1 day    [facility] 02/10/2011

## 2011-02-10 NOTE — Progress Notes (Addendum)
Attempted to call both of the patient's daughters Mateo Flow and Elmyra Ricks) to update on patient's status and need for blood transfusion. Valerie's phone number would not connect, and Elmyra Ricks did not answer. Will attempt to contact them again at a later time.   Addendum: called both daughters for a second time at 13; Valerie's phone did not connect, Elmyra Ricks did not answer.   Addendum: attempted to contact daughters for a third time to update on patient status. No answer. [place]

## 2011-02-10 NOTE — Progress Notes (Signed)
CRITICAL VALUE ALERT  Critical value received:  Hgb 6.8  Date of notification: 02/10/11  Time of notification:  0330  Critical value read back: yes  Nurse who received alert: A. Darcel Smalling RN  MD notified (1st page):  Dr. Ninfa Linden   Time of first page:  0335  MD notified (2nd page):  Time of second page:  Responding MD:  Dr. Ninfa Linden   Time MD responded:  [id number]

## 2011-02-10 NOTE — Progress Notes (Signed)
Subjective: Interval History: none.  Objective: Vital signs in last 24 hours:  Temp:  [98.2 F (36.8 C)-101 F (38.3 C)] 99.6 F (37.6 C) (11/25 1110) Pulse Rate:  [111-131] 112  (11/25 1000) Resp:  [10-31] 11  (11/25 1000) BP: (52-163)/(16-75) 96/42 mmHg (11/25 1000) SpO2:  [89 %-100 %] 98 % (11/25 1000) Arterial Line BP: (83-149)/(44-60) 90/49 mmHg (11/25 1000)  Weight change:   Intake/Output: I/O last 3 completed shifts: In: 1364.8 [P.O.:10; I.V.:916.5; Blood:308.3; NG/GT:30; IV Piggyback:100] Out: 1 [Stool:1]   Intake/Output this shift:  Total I/O In: 610.5 [I.V.:443; Blood:137.5; NG/GT:30] Out: 200 [Emesis/NG output:200]  CVS- RRR faint systolic murmur RS- CTA ABD- BS present soft non-distended appropriately tender EXT- no edema  Lab Results:  Basename 02/10/11 0315 02/09/11 1800 02/09/11 1621 02/09/11 0700  WBC 20.5* 19.4* -- 23.7*  HGB 6.8* 7.2* 8.2* --  HCT 19.1* 20.3* 24.0* --  PLT 471* 424* -- 385   BMET  Basename 02/10/11 0315 02/09/11 1800 02/09/11 1621 02/08/11 0630  NA 134* 133* 130* --  K 4.9 4.1 4.1 --  CL 94* 93* -- 93*  CO2 21 20 -- 28  GLUCOSE 149* 77 -- 70  BUN 70* 63* -- 46*  CREATININE 7.52* 7.06* -- 5.78*  CALCIUM 7.4* 7.4* -- 8.4  PHOS -- -- -- 4.1   LFT  Basename 02/08/11 0630  PROT --  ALBUMIN 2.1*  AST --  ALT --  ALKPHOS --  BILITOT --  BILIDIR --  IBILI --   PT/INR No results found for this basename: LABPROT:2,INR:2 in the last 72 hours Hepatitis Panel No results found for this basename: HEPBSAG,HCVAB,HEPAIGM,HEPBIGM in the last 72 hours  Studies/Results: Ct Abdomen Pelvis Wo Contrast  02/08/2011  *RADIOLOGY REPORT*  Clinical Data: Diffuse abdominal pain, leukocytosis, fever, constipation, back surgery 2 months ago  CT ABDOMEN AND PELVIS WITHOUT CONTRAST  Technique:  Multidetector CT imaging of the abdomen and pelvis was performed following the standard protocol without intravenous contrast. The patient drank dilute  oral contrast for exam.  IV contrast not administered, patient with history of chronic renal insufficiency.  Comparison: None  Findings: Atelectasis at both lung bases. Atherosclerotic calcifications aorta and coronary arteries. Surgical absence of the right kidney. Numerous left renal cysts, largest 3.5 x 2.4 cm image 27. Liver, spleen, pancreas, and adrenal glands unremarkable. Scattered bowel wall thickening of the colon with pneumatosis at the right colon mostly, minimally in the transverse colon, suspicious for ischemic colitis. Dilatation of the right colon, 7.9 cm transverse diameter. Small bowel loops unremarkable. Beam hardening artifacts from orthopedic hardware L4-S1. Unremarkable uterus and ovaries. Bladder decompressed. Question mild rectal wall thickening as well. Fascial defect right lateral abdominal wall adjacent to right lobe liver without bowel herniation. No definite mass, adenopathy, free fluid, or free air.  IMPRESSION: Dilatation and bowel wall thickening of the ascending and transverse colon with scattered areas of pneumatosis, predominately ascending colon, suspicious for ischemic colitis. No evidence of bowel perforation or obstruction. Question mild associated rectal wall thickening may represent additional colitis. Extensive atherosclerotic disease. Bibasilar atelectasis. 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  Findings called to Dr. Jimmy Footman on 02/08/2011 at 1918 hours.  Original Report Authenticated By: Burnetta Sabin, M.D.   Dg Chest 2 View  02/08/2011  *RADIOLOGY REPORT*  Clinical Data: Leukocytosis  CHEST - 2 VIEW  Comparison: 02/03/2011  Findings:  There is a mild cardiac enlargement.  There is no pleural effusion or  pulmonary edema.  No airspace consolidation identified.  Review of the visualized osseous structures is unremarkable.  IMPRESSION:  1.  No active cardiopulmonary abnormalities.  Original Report Authenticated By:  Angelita Ingles, M.D.   Dg Chest Port 1 View  02/09/2011  *RADIOLOGY REPORT*  Clinical Data: Central line placement.  PORTABLE CHEST - 1 VIEW  Comparison: 1 day prior  Findings: A right IJ central line terminates at the caval/atrial junction. No pneumothorax.  Nasogastric tube extends beyond the  inferior aspect of the film. Surgical changes in the right upper abdomen.  Mild cardiomegaly. Cannot exclude trace bilateral pleural effusions. No congestive failure.  Decreased bibasilar aeration with patchy airspace disease.  IMPRESSION:  1.  Right IJ central line, terminating at caval atrial junction, without pneumothorax. 2.  Developing bibasilar airspace disease, favor atelectasis. 3.  Cannot exclude small bilateral pleural effusions.  Original Report Authenticated By: Areta Haber, M.D.    I have reviewed the patient's current medications.  Assessment/Plan:  ESRD- TTS  Missed HD yesterday secondary to exploratory laparotomy. However no urgent indicvation for dialysis today  ANEMIA- received 2 units of packed red blood cells this am  MBD- will follow no Vitamin D ordered  HTN/VOL- Appears well compensated at present  ACCESS-  Right AVF Thrill  OTHER- S/p exploratory Laparotomy 11/24. Acute blood loss necessitating the transfusion 11/25. Blood pressures a little soft likely due to narcotics post op. Anticipate improvement. No dialysis needed today     LOS: 1 [name] W @TODAY @11 :19 AM

## 2011-02-11 ENCOUNTER — Other Ambulatory Visit: Payer: Self-pay

## 2011-02-11 ENCOUNTER — Inpatient Hospital Stay (HOSPITAL_COMMUNITY): Payer: Medicare Other

## 2011-02-11 LAB — CBC
MCV: 82.1 fL (ref 78.0–100.0)
Platelets: 425 10*3/uL — ABNORMAL HIGH (ref 150–400)
RBC: 2.91 MIL/uL — ABNORMAL LOW (ref 3.87–5.11)
WBC: 20.1 10*3/uL — ABNORMAL HIGH (ref 4.0–10.5)

## 2011-02-11 LAB — IRON AND TIBC
Saturation Ratios: 20 % (ref 20–55)
UIBC: 85 ug/dL — ABNORMAL LOW (ref 125–400)

## 2011-02-11 LAB — GLUCOSE, CAPILLARY: Glucose-Capillary: 92 mg/dL (ref 70–99)

## 2011-02-11 LAB — RENAL FUNCTION PANEL
CO2: 22 mEq/L (ref 19–32)
Chloride: 96 mEq/L (ref 96–112)
Creatinine, Ser: 8.44 mg/dL — ABNORMAL HIGH (ref 0.50–1.10)
GFR calc Af Amer: 5 mL/min — ABNORMAL LOW (ref >90)
GFR calc non Af Amer: 5 mL/min — ABNORMAL LOW (ref >90)
Glucose, Bld: 104 mg/dL — ABNORMAL HIGH (ref 70–99)

## 2011-02-11 MED ORDER — DARBEPOETIN ALFA-POLYSORBATE 200 MCG/0.4ML IJ SOLN
200.0000 ug | Freq: Once | INTRAMUSCULAR | Status: AC
  Administered 2011-02-11: 200 ug via INTRAVENOUS
  Filled 2011-02-11: qty 0.4

## 2011-02-11 MED ORDER — PENTAFLUOROPROP-TETRAFLUOROETH EX AERO
1.0000 "application " | INHALATION_SPRAY | CUTANEOUS | Status: DC | PRN
  Filled 2011-02-11: qty 103.5

## 2011-02-11 MED ORDER — DARBEPOETIN ALFA-POLYSORBATE 200 MCG/0.4ML IJ SOLN
INTRAMUSCULAR | Status: AC
  Filled 2011-02-11: qty 0.4

## 2011-02-11 MED ORDER — SODIUM CHLORIDE 0.9 % IV SOLN: 100.0000 mL | INTRAVENOUS | Status: DC | PRN

## 2011-02-11 MED ORDER — CALCIUM CARBONATE ANTACID 500 MG PO CHEW
400.0000 mg | CHEWABLE_TABLET | Freq: Three times a day (TID) | ORAL | Status: DC
  Administered 2011-02-11 – 2011-02-25 (×27): 400 mg via ORAL
  Filled 2011-02-11 (×45): qty 2

## 2011-02-11 MED ORDER — DARBEPOETIN ALFA-POLYSORBATE 200 MCG/0.4ML IJ SOLN
200.0000 ug | INTRAMUSCULAR | Status: DC
  Administered 2011-02-19: 200 ug via INTRAVENOUS
  Filled 2011-02-11: qty 0.4

## 2011-02-11 MED ORDER — ACETAMINOPHEN 325 MG PO TABS
650.0000 mg | ORAL_TABLET | Freq: Four times a day (QID) | ORAL | Status: DC | PRN
  Administered 2011-02-11 – 2011-02-12 (×2): 650 mg via ORAL
  Filled 2011-02-11 (×2): qty 2

## 2011-02-11 MED ORDER — LIDOCAINE-PRILOCAINE 2.5-2.5 % EX CREA
1.0000 "application " | TOPICAL_CREAM | CUTANEOUS | Status: DC | PRN
  Filled 2011-02-11: qty 5

## 2011-02-11 MED ORDER — VANCOMYCIN HCL IN DEXTROSE 1-5 GM/200ML-% IV SOLN
1000.0000 mg | Freq: Once | INTRAVENOUS | Status: DC
  Filled 2011-02-11: qty 200

## 2011-02-11 MED ORDER — NEPRO/CARBSTEADY PO LIQD
237.0000 mL | ORAL | Status: DC | PRN
  Filled 2011-02-11: qty 237

## 2011-02-11 MED ORDER — LIDOCAINE HCL (PF) 1 % IJ SOLN
5.0000 mL | INTRAMUSCULAR | Status: DC | PRN
  Filled 2011-02-11: qty 5

## 2011-02-11 MED ORDER — VANCOMYCIN HCL 500 MG IV SOLR
500.0000 mg | INTRAVENOUS | Status: DC | PRN
  Administered 2011-02-11 – 2011-02-16 (×3): 500 mg via INTRAVENOUS
  Filled 2011-02-11 (×2): qty 500

## 2011-02-11 MED ORDER — METOPROLOL TARTRATE 1 MG/ML IV SOLN
5.0000 mg | Freq: Four times a day (QID) | INTRAVENOUS | Status: DC | PRN
  Administered 2011-02-11 – 2011-02-12 (×2): 5 mg via INTRAVENOUS
  Administered 2011-02-13: 2.5 mg via INTRAVENOUS
  Filled 2011-02-11 (×4): qty 5

## 2011-02-11 NOTE — Progress Notes (Signed)
[facility] Progress Note Patient Name: [name] DOB: 1950-06-16 MRN: [id number]  Date of Service  02/11/2011   HPI/Events of Note  Febrile with SVT, BP ok   eICU Interventions  Chk EKG, lopressor IV Chk blood cx, tylenol prn, ct vanc/ zosyn Defer rest to primary svc   Intervention Category Major Interventions: Arrhythmia - evaluation and management  Brittain Hosie V. 02/11/2011, 7:10 PM

## 2011-02-11 NOTE — Progress Notes (Signed)
Subjective: Interval History: Has not ben dialyzed since last Wednesday Reports back pain; using PCA Denies SOB, cough or nausea  SCHEDULED MEDS:    . enoxaparin  30 mg Subcutaneous Q24H  . morphine   Intravenous Q4H  . piperacillin-tazobactam (ZOSYN)  IV  2.25 g Intravenous Q8H  . vancomycin  500 mg Intravenous Q T,Th,Sa-HD   Objective:  Vital signs in last 24 hours:  Temp:  [98.4 F (36.9 C)-99.6 F (37.6 C)] 98.4 F (36.9 C) (11/26 0711) Pulse Rate:  [98-116] 114  (11/26 0800) Resp:  [6-28] 21  (11/26 0800) BP: (72-136)/(18-79) 126/66 mmHg (11/26 0800) SpO2:  [91 %-98 %] 95 % (11/26 0800) Arterial Line BP: (85-90)/(47-49) 85/48 mmHg (11/25 1100) Weight:  [55.1 kg (121 lb 7.6 oz)-61.598 kg (135 lb 12.8 oz)] 135 lb 12.8 oz (61.598 kg) (11/26 0500) Weight change:   Intake/Output: I/O last 3 completed shifts: In: 3376.8 [P.O.:290; I.V.:2193.5; Blood:633.3; NG/GT:60; IV Piggyback:200] Out: 230 [Emesis/NG output:200; Stool:30]  Intake/Output this shift:  Total I/O In: 84.1 [I.V.:84.1] Out: -   EXAM: CVS-tachycardic at 116' s1s2 no s3 1-2/6 murmur usb  RS- lungs grossly clear ABD-colostomy right lower quadrant; bowel sounds present; blood tinged watery liquid in colostomy bag EXT- no significant edema; right AVG good bruit and thrill  Lab Results:  Basename 02/11/11 0455 02/10/11 0315 02/09/11 1800  WBC 20.1* 20.5* 19.4*  HGB 8.4* 6.8* 7.2*  HCT 23.9* 19.1* 20.3*  PLT 425* 471* 424*   BMET  Basename 02/11/11 0455 02/10/11 0315 02/09/11 1800  NA 135 134* 133*  K 5.0 4.9 4.1  CL 96 94* 93*  CO2 22 21 20   GLUCOSE 104* 149* 77  BUN 80* 70* 63*  CREATININE 8.44* 7.52* 7.06*  CALCIUM 7.1* 7.4* 7.4*  PHOS 5.1* -- --   LFT  Basename 02/11/11 0455  PROT --  ALBUMIN 1.7*  AST --  ALT --  ALKPHOS --  BILITOT --  BILIDIR --  IBILI --   PT/INR No results found for this basename: LABPROT:2,INR:2 in the last 72 hours Hepatitis Panel No results found for  this basename: HEPBSAG,HCVAB,HEPAIGM,HEPBIGM in the last 72 hours PTH: Lab Results  Component Value Date   CALCIUM 7.1* 02/11/2011   CAION 0.90* 02/09/2011   PHOS 5.1* 02/11/2011   No results found for this basename: IRON, TIBC, FERRITIN    Studies/Results: Dg Chest Port 1 View  02/09/2011  *RADIOLOGY REPORT*  Clinical Data: Central line placement.  PORTABLE CHEST - 1 VIEW  Comparison: 1 day prior  Findings: A right IJ central line terminates at the caval/atrial junction. No pneumothorax.  Nasogastric tube extends beyond the  inferior aspect of the film. Surgical changes in the right upper abdomen.  Mild cardiomegaly. Cannot exclude trace bilateral pleural effusions. No congestive failure.  Decreased bibasilar aeration with patchy airspace disease.  IMPRESSION:  1.  Right IJ central line, terminating at caval atrial junction, without pneumothorax. 2.  Developing bibasilar airspace disease, favor atelectasis. 3.  Cannot exclude small bilateral pleural effusions.  Original Report Authenticated By: Areta Haber, M.D.    Assessment/Plan:  ESRD-usually TTS; has not been dialyzed since last Wednesday; will  HD today then thurs sat thereafter; no heparin for now  S/p lumbar decompression surgery by Dr. Sherwood Gambler on 01/30/11 - still complaining of back pain  S/p extended right hemicolectomy with colostomy for ischemic bowel on 02/09/11  ANEMIA-transfused 2 units on 11/25 for Hb 6.8; now up to 8.4; restart Aranesp; check iron studies  MBD-h/o parathyroidectomy; restart caco3  HTN/VOL-bp OK; volume looks surprisingly good give no HD for 5 days  ACCESS-no issues; right AVG     LOS: 2 Mostafa Yuan B @TODAY @9 :20 AM

## 2011-02-11 NOTE — Progress Notes (Signed)
Subjective: S/P EXPLORATORY LAPAROTOMY, EXTENDED RIGHT HEMICOLECTOMY For ischemic colon with ileostomy Doing okay. Tolerating clear liquids with some nausea, but no emesis. C/o pain about abd incision as expected Objective: Vital signs in last 24 hours: Temp:  [97.9 F (36.6 C)-99.2 F (37.3 C)] 97.9 F (36.6 C) (11/26 1107) Pulse Rate:  [98-120] 120  (11/26 1100) Resp:  [6-28] 23  (11/26 1100) BP: (83-128)/(46-79) 128/78 mmHg (11/26 1100) SpO2:  [93 %-98 %] 96 % (11/26 1100) Weight:  [55.1 kg (121 lb 7.6 oz)-61.598 kg (135 lb 12.8 oz)] 135 lb 12.8 oz (61.598 kg) (11/26 0500) Last BM Date: 02/10/11 (via colostomy )  Intake/Output from previous day: 11/25 0701 - 11/26 0700 In: 2012 [P.O.:280; I.V.:1277; Blood:325; NG/GT:30; IV Piggyback:100] Out: 230 [Emesis/NG output:200; Stool:30] Intake/Output this shift: Total I/O In: 254.1 [I.V.:204.1; IV Piggyback:50] Out: -   General appearance: alert, appears older than stated age, mild distress and chronically ill appearing Resp: clear to auscultation bilaterally Cardio: regular rate and rhythm and mildly tachycardic GI: +BS, soft, midline incision with staples and wicking between staples  Stoma pink and some loose dark stool in bag Extremities: no edema, redness or tenderness in the calves or thighs  Lab Results:   [facility] 02/11/11 0455 02/10/11 0315  WBC 20.1* 20.5*  HGB 8.4* 6.8*  HCT 23.9* 19.1*  PLT 425* 471*   BMET  Basename 02/11/11 0455 02/10/11 0315  NA 135 134*  K 5.0 4.9  CL 96 94*  CO2 22 21  GLUCOSE 104* 149*  BUN 80* 70*  CREATININE 8.44* 7.52*  CALCIUM 7.1* 7.4*   PT/INR No results found for this basename: LABPROT:2,INR:2 in the last 72 hours ABG  Basename 02/09/11 1621  PHART 7.459*  HCO3 25.4*    Studies/Results: Dg Chest Port 1 View  02/09/2011  *RADIOLOGY REPORT*  Clinical Data: Central line placement.  PORTABLE CHEST - 1 VIEW  Comparison: 1 day prior  Findings: A right IJ central line  terminates at the caval/atrial junction. No pneumothorax.  Nasogastric tube extends beyond the  inferior aspect of the film. Surgical changes in the right upper abdomen.  Mild cardiomegaly. Cannot exclude trace bilateral pleural effusions. No congestive failure.  Decreased bibasilar aeration with patchy airspace disease.  IMPRESSION:  1.  Right IJ central line, terminating at caval atrial junction, without pneumothorax. 2.  Developing bibasilar airspace disease, favor atelectasis. 3.  Cannot exclude small bilateral pleural effusions.  Original Report Authenticated By: Areta Haber, M.D.    Anti-infectives: Anti-infectives     Start     Dose/Rate Route Frequency Ordered Stop   02/14/11 1200   vancomycin (VANCOCIN) 500 mg in sodium chloride 0.9 % 100 mL IVPB        500 mg 100 mL/hr over 60 Minutes Intravenous Every Hemodialysis 02/11/11 1122     02/12/11 1200   vancomycin (VANCOCIN) 500 mg in sodium chloride 0.9 % 100 mL IVPB  Status:  Discontinued        500 mg 100 mL/hr over 60 Minutes Intravenous Every T-Th-Sa (Hemodialysis) 02/09/11 2050 02/11/11 1120   02/11/11 1600   vancomycin (VANCOCIN) IVPB 1000 mg/200 mL premix        1,000 mg 200 mL/hr over 60 Minutes Intravenous  Once 02/11/11 1119     02/09/11 2200  piperacillin-tazobactam (ZOSYN) IVPB 2.25 g       2.25 g 100 mL/hr over 30 Minutes Intravenous 3 times per day 02/09/11 2050     02/09/11 2100   piperacillin-tazobactam (  ZOSYN) IVPB 3.375 g  Status:  Discontinued        3.375 g 12.5 mL/hr over 240 Minutes Intravenous Every 8 hours 02/09/11 2034 02/09/11 2050          Assessment/Plan: 1.s/p EXPLORATORY LAPAROTOMY, EXTENDED RIGHT HEMICOLECTOMY-for ischemic colon- POD #2  Doing well and tolerating clear liquids, but some nausea. Will continue on clears for now. 2.ESRD on HD- okay to go to dialysis unit for HD today 3.S/P L- spine surgery- ? Resume PT/OT soon 4. VTE- SCD, Lovenox 5. FEN- continue clears 6. Dispo- continue  usual post op care, may be able to transfer out later today if tolerates HD well and Dr. Lucia Gaskins agrees.     LOS: 2 days   Karmen Altamirano,PA-C Pager [phone] General Trauma Pager [phone]

## 2011-02-11 NOTE — Progress Notes (Signed)
62ml morphine wasted in sink verified with two RNs. Haig Prophet [phone])

## 2011-02-11 NOTE — Progress Notes (Signed)
Patient admitted from CIR/ inpatient acute rehabilitation 02/01/11 until 02/09/11.  Please reorder P.T. And O.T. When felt appropriate. I will then follow up and assist with determining if patient will need to return to inpatient acute rehabilitation before return home. Please call with any questions. Pager [phone].

## 2011-02-11 NOTE — Progress Notes (Signed)
ANTIBIOTIC CONSULT NOTE - FOLLOW UP  Pharmacy Consult for vanc #6/ zosyn # 3 Indication: ishemic colitis s/p R hemicolectomy w/ ileostomy   Patient Measurements: Height: 5\' 5"  (165.1 cm) (per patient ) Weight: 135 lb 12.8 oz (61.598 kg) (bed weight, weighed with 1 pillow on bed, no blanket or SCDs) IBW/kg (Calculated) : 57      Labs:  Basename 02/11/11 0455 02/10/11 0315 02/09/11 1800  WBC 20.1* 20.5* 19.4*  HGB 8.4* 6.8* 7.2*  PLT 425* 471* 424*  LABCREA -- -- --  CREATININE 8.44* 7.52* 7.06*   Recent Results (from the past 720 hour(s))  SURGICAL PCR SCREEN     Status: Normal   Collection Time   01/25/11  9:10 AM      Component Value Range Status Comment   MRSA, PCR NEGATIVE  NEGATIVE  Final    Staphylococcus aureus NEGATIVE  NEGATIVE  Final   CULTURE, BLOOD (ROUTINE X 2)     Status: Normal   Collection Time   02/03/11  4:09 PM      Component Value Range Status Comment   Specimen Description BLOOD LEFT ARM   Final    Special Requests     Final    Value: BOTTLES DRAWN AEROBIC AND ANAEROBIC 8CC AER, [place] ANA   Setup Time [id number]   Final    Culture NO GROWTH 5 DAYS   Final    Report Status 02/09/2011 FINAL   Final   CULTURE, BLOOD (ROUTINE X 2)     Status: Normal   Collection Time   02/03/11  4:19 PM      Component Value Range Status Comment   Specimen Description BLOOD LEFT HAND   Final    Special Requests BOTTLES DRAWN AEROBIC ONLY [place]   Final    Setup Time [id number]   Final    Culture NO GROWTH 5 DAYS   Final    Report Status 02/09/2011 FINAL   Final   CULTURE, BLOOD (ROUTINE X 2)     Status: Normal (Preliminary result)   Collection Time   02/06/11  5:40 PM      Component Value Range Status Comment   Specimen Description BLOOD HEMODIALYSIS GRAFT   Final    Special Requests BOTTLES DRAWN AEROBIC AND ANAEROBIC 10CC   Final    Setup Time [id number]   Final    Culture     Final    Value:        BLOOD CULTURE RECEIVED NO GROWTH TO DATE CULTURE WILL BE HELD  FOR 5 DAYS BEFORE ISSUING A FINAL NEGATIVE REPORT   Report Status PENDING   Incomplete   CULTURE, BLOOD (ROUTINE X 2)     Status: Normal (Preliminary result)   Collection Time   02/06/11  6:40 PM      Component Value Range Status Comment   Specimen Description BLOOD HEMODIALYSIS GRAFT   Final    Special Requests BOTTLES DRAWN AEROBIC AND ANAEROBIC 10CC   Final    Setup Time [id number]   Final    Culture     Final    Value:        BLOOD CULTURE RECEIVED NO GROWTH TO DATE CULTURE WILL BE HELD FOR 5 DAYS BEFORE ISSUING A FINAL NEGATIVE REPORT   Report Status PENDING   Incomplete      Assessment: vanc # 6, zosyn # 3. Last HD Wedneday 11/21; Last Vancomycin dose Wed 11/21 = 1gm. To get HD today. Afebril. WBC 20.1  Plan:   Give vancomycin 1000 mg after HD today. Continue zosyn 2.25 q8h.  Anticipate dc abx soon. Leodis Sias T 02/11/2011,11:22 AM

## 2011-02-11 NOTE — Discharge Summary (Signed)
[name], Mccarthy              ACCOUNT NO.:  [id number]  MEDICAL RECORD NO.:  [id number]  LOCATION:  [id number]                         FACILITY:  [facility]  PHYSICIAN:  [name], M.D.DATE OF BIRTH:  December 28, 1950  DATE OF ADMISSION:  02/01/2011 DATE OF DISCHARGE:  02/09/2011                              DISCHARGE SUMMARY   DISCHARGE DIAGNOSES: 1. Lumbar stenosis with radiculopathy status post lumbar 4-5 and     lumbar L5-S1 laminectomy and fusion January 30, 2011. 2. Subcutaneous Lovenox for deep vein thrombosis prophylaxis. 3. Pain management. 4. End-stage renal disease, hemodialysis. 5. Ischemic colitis. 6. Hypertension. 7. Chronic anemia.  This is a 60 year old black female with end-stage renal disease, admitted January 30, 2011, with increased low back pain since August 2012, progressive right foot drop.  X-rays and imaging showed a grade 2 dynamic degenerative spondylolisthesis of lumbar L4 and lumbar L5.  MRI revealed severe multifactorial spinal stenosis at lumbar 4-5 and marked- to-severe multifactorial stenosis at lumbar 5 and S1.  The patient underwent L4-S1 lumbar laminectomy and foraminotomies with decompression as well as lumbar 4-5 and lumbar L5 and S1 posterior lumbar interbody arthrodesis on January 30, 2011, per Dr. Sherwood Gambler.  Fitted with a back brace when out of bed.  Noted on early morning of February 01, 2011, of increased footdrop, followup per Neurosurgery.  MRI completed showing no obvious hematoma.  Dr. Sherwood Gambler felt may be due to some cord inflammation, but no current plan for Decadron.  The patient currently required moderate assist to ambulate 60 feet, was admitted for comprehensive rehab program.  PAST MEDICAL HISTORY:  See discharge diagnoses.  ALLERGIES:  CONTRAST MEDIA.  The patient reports she quit smoking about 5 months ago.  She denies any alcohol.  FUNCTIONAL HISTORY:  The patient independent with basic activities of daily living.   She was living alone in a 1-level home with 2 steps to entry.  Daughter and friends come to check on her.  PHYSICAL EXAMINATION:  VITAL SIGNS:  Blood pressure 90/54, pulse 106, temperature 98.1, respirations 18. GENERAL:  This was an alert female, oriented x3.  Well-developed. LUNGS:  Clear to auscultation. CARDIAC:  Regular rate and rhythm. ABDOMEN:  Nontender on admission to rehab services. BACK:  Incision site healing nicely with Dermabond in place.  REHABILITATION HOSPITAL COURSE:  The patient was admitted to inpatient rehab services with therapies initiated on a 3-hour daily basis consisting of physical therapy, occupational therapy, and rehabilitation nursing.  The following issues were addressed during the patient's rehabilitation stay.  Pertaining to [name] lumbar stenosis with radiculopathy, he had undergone decompression and fusion per Dr. Sherwood Gambler, noted footdrop, followed by Dr. Sherwood Gambler.  Plan was for AFO braces as needed.  Recent MRI of the spine showed no obvious hematoma and monitored.  Pain management ongoing with the use of Norco and Robaxin.  She continued on dialysis as advised per rehab per [employer].  Her blood pressures were monitored on Lopressor.  The patient received weekly collaborative interdisciplinary team conferences to discuss estimated length of stay, family teaching, and any barriers to discharge.  She continued to require moderate assist for her overall mobility progressing nicely with her endurance  and strength.  Family teaching was to be arranged for ongoing therapy.  Noted, the patient developed some increased abdominal pain.  She had recently spiked a temperature, had been placed on vancomycin while on dialysis.  Her temperatures did overall improve.  A CT scan of the abdomen was completed demonstrating right and transverse colon with wall thickening and pneumatosis.  Due to these findings, General Surgery was  consulted, Dr. Coralie Keens, question infection versus ischemic colitis.  The patient was transferred to [facility].  Underwent exploratory laparotomy, extended right hemicolectomy on February 09, 2011, per Dr. Ninfa Linden.  The patient would remain on Acute Care Services until medically stable.  The patient's condition was guarded at the time of discharge from rehab services.     [name], P.A.   ______________________________ [name], M.D.    DA/MEDQ  D:  02/11/2011  T:  02/11/2011  Job:  [id number]

## 2011-02-11 NOTE — Consult Note (Signed)
WOC ostomy consult  Stoma type/location:  Ileostomy Stomal assessment/size: 2 inches, stoma above skin level, red and viable Peristomal assessment: Intact Treatment options for stomal/peristomal skin:  One piece pouch applied. Output  50 cc brown liquid.  Education provided:  Demonstrated pouch application and emptying.  No family at bedside for teaching.  Educational materials left at bedside.  Pt was not aware of ostomy surgery and did not particpate in pouching activities.  Will require large amt of assistance with pouching routines.  Plan to reassess when stable and out of ICU.   Julien Girt, RN, MSN, [employer]  [phone]

## 2011-02-12 ENCOUNTER — Encounter (HOSPITAL_COMMUNITY): Payer: Self-pay | Admitting: Surgery

## 2011-02-12 LAB — GLUCOSE, CAPILLARY
Glucose-Capillary: 98 mg/dL (ref 70–99)
Glucose-Capillary: 91 mg/dL (ref 70–99)
Glucose-Capillary: 86 mg/dL (ref 70–99)
Glucose-Capillary: 109 mg/dL — ABNORMAL HIGH (ref 70–99)

## 2011-02-12 LAB — CBC
Hemoglobin: 8.1 g/dL — ABNORMAL LOW (ref 12.0–15.0)
MCH: 28.6 pg (ref 26.0–34.0)
MCHC: 34.8 g/dL (ref 30.0–36.0)
RDW: 15.8 % — ABNORMAL HIGH (ref 11.5–15.5)

## 2011-02-12 LAB — RENAL FUNCTION PANEL
Albumin: 1.6 g/dL — ABNORMAL LOW (ref 3.5–5.2)
BUN: 23 mg/dL (ref 6–23)
Chloride: 100 mEq/L (ref 96–112)
GFR calc Af Amer: 14 mL/min — ABNORMAL LOW (ref >90)
GFR calc non Af Amer: 12 mL/min — ABNORMAL LOW (ref >90)
Phosphorus: 3.2 mg/dL (ref 2.3–4.6)
Potassium: 4.1 mEq/L (ref 3.5–5.1)
Sodium: 139 mEq/L (ref 135–145)

## 2011-02-12 LAB — FERRITIN: Ferritin: 2404 ng/mL — ABNORMAL HIGH (ref 10–291)

## 2011-02-12 MED ORDER — MORPHINE SULFATE (PF) 1 MG/ML IV SOLN
INTRAVENOUS | Status: DC
  Administered 2011-02-12: 3 mg via INTRAVENOUS
  Administered 2011-02-13: 10 mg via INTRAVENOUS
  Administered 2011-02-13: 2 mg via INTRAVENOUS
  Administered 2011-02-13: 8 mg via INTRAVENOUS
  Administered 2011-02-13: 0 mg via INTRAVENOUS
  Administered 2011-02-13: 03:00:00 via INTRAVENOUS
  Administered 2011-02-13: 9 mg via INTRAVENOUS
  Administered 2011-02-14: 2 mg via INTRAVENOUS
  Administered 2011-02-14: 4 mg via INTRAVENOUS
  Administered 2011-02-14: 5 mg via INTRAVENOUS
  Administered 2011-02-14: 5.91 mg via INTRAVENOUS
  Administered 2011-02-14: 4 mg via INTRAVENOUS
  Administered 2011-02-14: 6 mg via INTRAVENOUS
  Administered 2011-02-15: 4 mg via INTRAVENOUS
  Administered 2011-02-15: 22:00:00 via INTRAVENOUS
  Administered 2011-02-15: 6 mg via INTRAVENOUS
  Administered 2011-02-15: 11 mg via INTRAVENOUS
  Administered 2011-02-15: 10 mg via INTRAVENOUS
  Administered 2011-02-15: 9 mg via INTRAVENOUS
  Administered 2011-02-15: 11.91 mg via INTRAVENOUS
  Administered 2011-02-16: 22:00:00 via INTRAVENOUS
  Administered 2011-02-16: 2 mg via INTRAVENOUS
  Administered 2011-02-16: 10 mg via INTRAVENOUS
  Administered 2011-02-16: 08:00:00 via INTRAVENOUS
  Administered 2011-02-16: 8 mg via INTRAVENOUS
  Administered 2011-02-16: 12 mg via INTRAVENOUS
  Administered 2011-02-17: 07:00:00 via INTRAVENOUS
  Administered 2011-02-17: 3 mg via INTRAVENOUS
  Administered 2011-02-17: 10.72 mg via INTRAVENOUS
  Filled 2011-02-12 (×6): qty 25

## 2011-02-12 MED ORDER — ONDANSETRON HCL 4 MG/2ML IJ SOLN: 4.0000 mg | Freq: Four times a day (QID) | INTRAMUSCULAR | Status: DC | PRN

## 2011-02-12 MED ORDER — SODIUM CHLORIDE 0.9 % IJ SOLN: 9.0000 mL | INTRAMUSCULAR | Status: DC | PRN

## 2011-02-12 MED ORDER — NALOXONE HCL 0.4 MG/ML IJ SOLN: 0.4000 mg | INTRAMUSCULAR | Status: DC | PRN

## 2011-02-12 MED ORDER — DIPHENHYDRAMINE HCL 12.5 MG/5ML PO ELIX
12.5000 mg | ORAL_SOLUTION | Freq: Four times a day (QID) | ORAL | Status: DC | PRN
  Filled 2011-02-12: qty 5

## 2011-02-12 MED ORDER — NEPRO/CARBSTEADY PO LIQD
237.0000 mL | Freq: Three times a day (TID) | ORAL | Status: DC
  Administered 2011-02-12 – 2011-02-24 (×17): 237 mL via ORAL
  Filled 2011-02-12 (×34): qty 237

## 2011-02-12 MED ORDER — DIPHENHYDRAMINE HCL 50 MG/ML IJ SOLN: 12.5000 mg | Freq: Four times a day (QID) | INTRAMUSCULAR | Status: DC | PRN

## 2011-02-12 NOTE — Progress Notes (Signed)
  Subjective: Pt ok. Still with some fevers. Using IS. Pain control adequate. Wants to try to eat more.  Objective: Vital signs in last 24 hours: Temp:  [96.9 F (36.1 C)-102.8 F (39.3 C)] 100.9 F (38.3 C) (11/27 0800) Pulse Rate:  [107-151] 121  (11/27 1000) Resp:  [8-33] 20  (11/27 1000) BP: (84-155)/(47-107) 133/98 mmHg (11/27 1000) SpO2:  [92 %-100 %] 98 % (11/27 1000) Weight:  [135 lb 2.3 oz (61.3 kg)-136 lb 11 oz (62 kg)] 136 lb 11 oz (62 kg) (11/26 1738) Last BM Date: 02/10/11 (via colostomy )  Intake/Output this shift: Total I/O In: 171.5 [I.V.:121.5; IV Piggyback:50] Out: -   Physical Exam: BP 133/98  Pulse 121  Temp(Src) 100.9 F (38.3 C) (Oral)  Resp 20  Ht 5\' 5"  (1.651 m)  Wt 136 lb 11 oz (62 kg)  BMI 22.75 kg/m2  SpO2 98% Heart:  Tachcardia, 4/6 systolic murmur. Lungs, clear Abdomen: mild distention but soft. Wound c/d/i, wicks removed. Ostomy pink and viable, bilious SB output in bag.  Labs: CBC  Basename 02/12/11 0246 02/11/11 0455  WBC 17.7* 20.1*  HGB 8.1* 8.4*  HCT 23.3* 23.9*  PLT 458* 425*   BMET  Basename 02/12/11 0246 02/11/11 0455  NA 139 135  K 4.1 5.0  CL 100 96  CO2 28 22  GLUCOSE 102* 104*  BUN 23 80*  CREATININE 3.67* 8.44*  CALCIUM 7.3* 7.1*   LFT  Basename 02/12/11 0246  PROT --  ALBUMIN 1.6*  AST --  ALT --  ALKPHOS --  BILITOT --  BILIDIR --  IBILI --  LIPASE --   PT/INR No results found for this basename: LABPROT:2,INR:2 in the last 72 hours ABG  Basename 02/09/11 1621  PHART 7.459*  HCO3 25.4*    Studies/Results: No results found.  Assessment:  1.   Acute ischemic colitis s/p Ex Lap with Extended (R)colectomy with end ileostomy. - 02/11/2011, Ninfa Linden  Post op fever, tachycardia Plan: Increase diet Increase activity with PT, will reorder back brace Encourage IS use.  LOS: 3 days    Federico Flake 02/12/2011  Looks good.  Cause of tachycardia not clear.  Will give lopressor 5 mg and  see how she responds.  She has a 4/6 heart murmur I assume is old.  Her ileostomy is putting out and she is taking liquid po.  She had dialysis yesterday.    D Fumie Fiallo 02/12/2011.

## 2011-02-12 NOTE — Progress Notes (Signed)
I contacted patient's daughter, Mateo Flow, by phone. She has her Mom's back brace used on CIR/inpatient acute rehabilitation and will bring to hospital today about 4 pm.  Mateo Flow is aware that therapy evaluations are ordered and is agreeable for patient to return to inpatient acute rehabilitation if felt appropriate. Please call for any questions pager [phone].

## 2011-02-12 NOTE — Plan of Care (Signed)
Problem: Phase II Progression Outcomes Goal: Progressing with IS, TCDB Outcome: Progressing Pt will do IS when prompted

## 2011-02-12 NOTE — Progress Notes (Signed)
Attempted to see pt. For initial evaluation, however pt's daughter is bringing pt's brace at 4pm today and pt. Needs brace for OOB activity. Will re-attempt as time allows when brace is delivered.  Thanks!  Lavona Mound, OTR/L Pager: [phone] 02/12/2011 .

## 2011-02-12 NOTE — Progress Notes (Signed)
PT Cancellation Note  ___Treatment cancelled today due to medical issues with patient which prohibited therapy  ___ Treatment cancelled today due to patient receiving procedure or test   ___ Treatment cancelled today due to patient's refusal to participate   _x__ Treatment cancelled today due to still awaiting brace from home.  PT to check back tomorrow.     Signature: Barbarann Ehlers. [place], [place], DPT [phone]

## 2011-02-12 NOTE — Progress Notes (Signed)
Subjective: Interval History:  Patient had HD last night, did not tolerate extremely well due to increased HR, no volume removal.  HR still up at 124.  No particular complaints right now other than back pain.  WBC is up and pt was febrile also Reports back pain; using PCA   SCHEDULED MEDS:    . calcium carbonate  400 mg of elemental calcium Oral TID WC  . darbepoetin (ARANESP) injection - DIALYSIS  200 mcg Intravenous Once  . darbepoetin (ARANESP) injection - DIALYSIS  200 mcg Intravenous Q Tue-HD  . enoxaparin  30 mg Subcutaneous Q24H  . morphine   Intravenous Q4H  . piperacillin-tazobactam (ZOSYN)  IV  2.25 g Intravenous Q8H  . vancomycin  1,000 mg Intravenous Once  . DISCONTD: vancomycin  500 mg Intravenous Q T,Th,Sa-HD   Objective:  Vital signs in last 24 hours:  Temp:  [96.9 F (36.1 C)-102.8 F (39.3 C)] 100.8 F (38.2 C) (11/27 0500) Pulse Rate:  [107-151] 116  (11/27 0700) Resp:  [8-33] 10  (11/27 0700) BP: (84-155)/(47-107) 118/54 mmHg (11/27 0700) SpO2:  [92 %-100 %] 94 % (11/27 0700) Weight:  [61.3 kg (135 lb 2.3 oz)-62 kg (136 lb 11 oz)] 136 lb 11 oz (62 kg) (11/26 1738) Weight change: 6.2 kg (13 lb 10.7 oz)  Intake/Output: I/O last 3 completed shifts: In: 1372.9 [P.O.:90; I.V.:1132.9; IV Piggyback:150] Out: [id number] [Stool:100]  Intake/Output this shift:     EXAM: CVS-tachycardic at 116' s1s2 no s3 1-2/6 murmur usb  RS- lungs grossly clear ABD-colostomy right lower quadrant; bowel sounds present; blood tinged watery liquid in colostomy bag EXT- no significant edema; right AVG good bruit and thrill  Lab Results:  Basename 02/12/11 0246 02/11/11 0455 02/10/11 0315  WBC 17.7* 20.1* 20.5*  HGB 8.1* 8.4* 6.8*  HCT 23.3* 23.9* 19.1*  PLT 458* 425* 471*   BMET  Basename 02/12/11 0246 02/11/11 0455 02/10/11 0315  NA 139 135 134*  K 4.1 5.0 4.9  CL 100 96 94*  CO2 28 22 21   GLUCOSE 102* 104* 149*  BUN 23 80* 70*  CREATININE 3.67* 8.44* 7.52*  CALCIUM 7.3*  7.1* 7.4*  PHOS 3.2 5.1* --   LFT  Basename 02/12/11 0246  PROT --  ALBUMIN 1.6*  AST --  ALT --  ALKPHOS --  BILITOT --  BILIDIR --  IBILI --   PT/INR No results found for this basename: LABPROT:2,INR:2 in the last 72 hours Hepatitis Panel No results found for this basename: HEPBSAG,HCVAB,HEPAIGM,HEPBIGM in the last 72 hours PTH: Lab Results  Component Value Date   CALCIUM 7.3* 02/12/2011   CAION 0.90* 02/09/2011   PHOS 3.2 02/12/2011   Lab Results  Component Value Date   IRON 21* 02/11/2011    Studies/Results: No results found.  Assessment/Plan:  ESRD-usually TTS; had HD yesterday;plan for next on Thurs, Sat thereafter; no heparin for now  S/p lumbar decompression surgery by Dr. Sherwood Gambler on 01/30/11 - still complaining of back pain  S/p extended right hemicolectomy with colostomy for ischemic bowel on 02/09/11  ANEMIA-transfused 2 units on 11/25 for Hb 6.8; now up to 8.4; restart Aranesp; check iron studies  MBD-h/o parathyroidectomy; restart caco3  HTN/VOL-bp OK; volume looks surprisingly good but does have dependant edema without hypoxia.  Volume removal as tolerated   Fever, increased WBC- per other teams, on zosyn and vanc  ACCESS-no issues; right AVG     LOS: 3 [name] @TODAY @7 :26 AM

## 2011-02-13 ENCOUNTER — Encounter (HOSPITAL_COMMUNITY): Payer: Self-pay | Admitting: Adult Health

## 2011-02-13 ENCOUNTER — Inpatient Hospital Stay (HOSPITAL_COMMUNITY): Payer: Medicare Other

## 2011-02-13 DIAGNOSIS — R000 Tachycardia, unspecified: Secondary | ICD-10-CM

## 2011-02-13 DIAGNOSIS — R509 Fever, unspecified: Secondary | ICD-10-CM

## 2011-02-13 DIAGNOSIS — I959 Hypotension, unspecified: Secondary | ICD-10-CM

## 2011-02-13 DIAGNOSIS — A419 Sepsis, unspecified organism: Secondary | ICD-10-CM

## 2011-02-13 LAB — GLUCOSE, CAPILLARY: Glucose-Capillary: 124 mg/dL — ABNORMAL HIGH (ref 70–99)

## 2011-02-13 LAB — CULTURE, BLOOD (ROUTINE X 2)
Culture  Setup Time: 201211220144
Culture: NO GROWTH
Culture: NO GROWTH

## 2011-02-13 LAB — TYPE AND SCREEN
Unit division: 0
Unit division: 0

## 2011-02-13 LAB — CBC
HCT: 23.9 % — ABNORMAL LOW (ref 36.0–46.0)
Hemoglobin: 8 g/dL — ABNORMAL LOW (ref 12.0–15.0)
MCHC: 33.5 g/dL (ref 30.0–36.0)

## 2011-02-13 LAB — RENAL FUNCTION PANEL
BUN: 36 mg/dL — ABNORMAL HIGH (ref 6–23)
Glucose, Bld: 111 mg/dL — ABNORMAL HIGH (ref 70–99)
Phosphorus: 3.9 mg/dL (ref 2.3–4.6)
Potassium: 4.6 mEq/L (ref 3.5–5.1)

## 2011-02-13 MED ORDER — ACETAMINOPHEN 650 MG RE SUPP
650.0000 mg | RECTAL | Status: AC | PRN
  Administered 2011-02-13: 650 mg via RECTAL
  Filled 2011-02-13: qty 1

## 2011-02-13 MED ORDER — FLUCONAZOLE IN SODIUM CHLORIDE 200-0.9 MG/100ML-% IV SOLN
200.0000 mg | INTRAVENOUS | Status: DC
  Administered 2011-02-13 – 2011-02-21 (×5): 200 mg via INTRAVENOUS
  Filled 2011-02-13 (×5): qty 100

## 2011-02-13 MED ORDER — SODIUM CHLORIDE 0.9 % IV SOLN
Freq: Once | INTRAVENOUS | Status: AC
  Administered 2011-02-13: 09:00:00 via INTRAVENOUS

## 2011-02-13 MED ORDER — ACETAMINOPHEN 325 MG PO TABS
650.0000 mg | ORAL_TABLET | Freq: Four times a day (QID) | ORAL | Status: DC | PRN
  Administered 2011-02-14 – 2011-02-17 (×3): 650 mg via ORAL
  Filled 2011-02-13 (×3): qty 2

## 2011-02-13 MED ORDER — SODIUM CHLORIDE 0.9 % IV SOLN
INTRAVENOUS | Status: DC
  Administered 2011-02-13: 17:00:00 via INTRAVENOUS

## 2011-02-13 NOTE — Progress Notes (Signed)
Subjective: Interval History:  Patient without complaints this AM, not even complaining of back pain.  Having high ostomy output, question about volume status, surgery giving her IVF  SCHEDULED MEDS:    . sodium chloride   Intravenous Once  . calcium carbonate  400 mg of elemental calcium Oral TID WC  . darbepoetin (ARANESP) injection - DIALYSIS  200 mcg Intravenous Q Tue-HD  . enoxaparin  30 mg Subcutaneous Q24H  . feeding supplement (NEPRO CARB STEADY)  237 mL Oral TID WC  . morphine   Intravenous Q4H  . piperacillin-tazobactam (ZOSYN)  IV  2.25 g Intravenous Q8H  . vancomycin  1,000 mg Intravenous Once  . DISCONTD: morphine   Intravenous Q4H   Objective:  Vital signs in last 24 hours:  Temp:  [98.4 F (36.9 C)-102.1 F (38.9 C)] 99.5 F (37.5 C) (11/28 0737) Pulse Rate:  [103-130] 130  (11/28 0800) Resp:  [9-24] 12  (11/28 0800) BP: (97-133)/(43-98) 128/67 mmHg (11/28 0800) SpO2:  [89 %-99 %] 96 % (11/28 0800) FiO2 (%):  [2 %] 2 % (11/27 1200) Weight:  [63.3 kg (139 lb 8.8 oz)] 139 lb 8.8 oz (63.3 kg) (11/28 0600) Weight change: 2 kg (4 lb 6.6 oz)  Intake/Output: I/O last 3 completed shifts: In: 2326.3 [P.O.:767; I.V.:1359.3; IV Piggyback:200] Out: 950 [Stool:950]  Intake/Output this shift:  Total I/O In: 540 [I.V.:540] Out: -   EXAM: CVS-tachycardic at 116' s1s2 no s3 1-2/6 murmur usb  RS- lungs grossly clear ABD-colostomy right lower quadrant; bowel sounds present; blood tinged watery liquid in colostomy bag EXT- no significant edema; right AVG good bruit and thrill  Lab Results:  Basename 02/13/11 0500 02/12/11 0246 02/11/11 0455  WBC 18.6* 17.7* 20.1*  HGB 8.0* 8.1* 8.4*  HCT 23.9* 23.3* 23.9*  PLT 579* 458* 425*   BMET  Basename 02/13/11 0500 02/12/11 0246 02/11/11 0455  NA 143 139 135  K 4.6 4.1 5.0  CL 103 100 96  CO2 26 28 22   GLUCOSE 111* 102* 104*  BUN 36* 23 80*  CREATININE 5.81* 3.67* 8.44*  CALCIUM 7.6* 7.3* 7.1*  PHOS 3.9 3.2 5.1*    LFT  Basename 02/13/11 0500  PROT --  ALBUMIN 1.6*  AST --  ALT --  ALKPHOS --  BILITOT --  BILIDIR --  IBILI --   PT/INR No results found for this basename: LABPROT:2,INR:2 in the last 72 hours Hepatitis Panel No results found for this basename: HEPBSAG,HCVAB,HEPAIGM,HEPBIGM in the last 72 hours PTH: Lab Results  Component Value Date   CALCIUM 7.6* 02/13/2011   CAION 0.90* 02/09/2011   PHOS 3.9 02/13/2011   Lab Results  Component Value Date   IRON 21* 02/11/2011    Studies/Results: No results found.  Assessment/Plan:  ESRD-usually TTS,now to get back on schedule, no heparin for now  S/p lumbar decompression surgery by Dr. Sherwood Gambler on 01/30/11 - still complaining of back pain although better today.  Dr. Lucia Gaskins to contact Dr. Rita Ohara  S/p extended right hemicolectomy with colostomy for ischemic bowel on 02/09/11, per CCS  ANEMIA-transfused 2 units on 11/25 for Hb 6.8; now up to 8.4; restart Aranesp; check iron studies  MBD-h/o parathyroidectomy; restart caco3  HTN/VOL-bp OK; volume looks surprisingly good but does have dependant edema without hypoxia.  Volume removal as tolerated.  I dont think bolus will hurt her today, also on D5 at low rate.  Fever, increased WBC- per other teams, on zosyn and vanc.  WBC seems to be trending down  ACCESS-no issues; right AVG  We will now take on renal service. I think she is ready for transfer as well.     LOS: 4 Mohamad Bruso A @TODAY @9 :34 AM

## 2011-02-13 NOTE — Progress Notes (Signed)
Physical Therapy Evaluation Patient Details Name: [name] MRN: [id number] DOB: [birth year] Today's Date: 02/13/2011  Problem List:  Patient Active Problem List  Diagnoses  . Lumbar stenosis with neurogenic claudication  . Spondylolisthesis of lumbar region  . End stage renal disease on dialysis  . Hypertension  . Unspecified viral hepatitis C without hepatic coma  . Radiculopathy  . Anemia of chronic kidney failure  . Acute ischemic colitis    Past Medical History:  Past Medical History  Diagnosis Date  . Seizures     r/t HTN in 1990's x 1  . Brain aneurysm   . Blood transfusion 1990's    r/t Kidney removal surgery  . Arthritis     Back  . Anemia   . Umbilical hernia age 53  . Heart murmur     Born with heart mumur, does not require follow up per pt  . Hypertension     Does not see a heart doctor, had pre transplant stress test at [facility]    . Diabetes mellitus     Borderline  . End stage renal disease on dialysis     T/Th/Sat dialysis on [employer]  . End stage renal disease on dialysis     01-29-2011  . Hepatitis C    Past Surgical History:  Past Surgical History  Procedure Date  . Nephrectomy 2010    right side done at [facility] on transplant list  . Umbilical hernia repair age 51- 34  . Appendectomy 1960's  . Vascular surgery     right arm dialysis graft  . Tonsillectomy     as a child  . Rib resection     d/t kidney removal  . Back surgery 01/30/2011    laminectomy  . Hernia repair   . Laparotomy 02/09/2011    Procedure: EXPLORATORY LAPAROTOMY;  Surgeon: Harl Bowie, MD;  Location: [place];  Service: General;  Laterality: N/A;  . Partial colectomy 02/09/2011    Procedure: PARTIAL COLECTOMY;  Surgeon: Harl Bowie, MD;  Location: [place];  Service: General;  Laterality: Right;  . Colostomy 02/09/2011    Procedure: COLOSTOMY;  Surgeon: Harl Bowie, MD;  Location: [place];  Service: General;  Laterality: Right;    PT  Assessment/Plan/Recommendation PT Assessment Clinical Impression Statement: Patient is s/p PLIF L4-S1 as well as ischemic bowel with new ostomy.  Fitted brace loosely over ostomy while pt. sitting and tightened only with mobilization.  Pt. will benefit from PT to maximize independence and decrease burden of care to go home with HHPT with family assist. PT Recommendation/Assessment: Patient will need skilled PT in the acute care venue PT Problem List: Decreased strength;Decreased activity tolerance;Decreased mobility;Decreased balance;Decreased cognition;Decreased knowledge of use of DME;Decreased safety awareness;Decreased knowledge of precautions;Pain;Impaired sensation PT Therapy Diagnosis : Acute pain;Difficulty walking;Altered mental status PT Plan PT Frequency: Min 3X/week PT Treatment/Interventions: DME instruction;Gait training;Functional mobility training;Therapeutic exercise;Balance training;Patient/family education;Cognitive remediation PT Recommendation Recommendations for Other Services: Rehab consult Follow Up Recommendations: Inpatient Rehab;24 hour supervision/assistance Equipment Recommended: Defer to next venue PT Goals  Acute Rehab PT Goals PT Goal Formulation: With patient Time For Goal Achievement: 2 weeks Pt will Roll Supine to Left Side: with supervision PT Goal: Rolling Supine to Left Side - Progress: Other (comment) Pt will go Sit to Supine/Side: with supervision;with HOB 0 degrees PT Goal: Sit to Supine/Side - Progress: Other (comment) Pt will Transfer Sit to Stand/Stand to Sit: with supervision;with upper extremity assist PT Transfer Goal: Sit to  Stand/Stand to Sit - Progress: Other (comment) Pt will Ambulate: 51 - 150 feet;with supervision;with least restrictive assistive device PT Goal: Ambulate - Progress: Other (comment) Pt will Go Up / Down Stairs: 1-2 stairs;with min assist;with least restrictive assistive device PT Goal: Up/Down Stairs - Progress: Other  (comment)  PT Evaluation Precautions/Restrictions  Precautions Precautions: Back;Fall Precaution Comments: Back brace Required Braces or Orthoses: Yes Spinal Brace: Lumbar corset;Applied in sitting position Other Brace/Splint: Aspen corset Restrictions Weight Bearing Restrictions: No Prior Functioning  Home Living Additional Comments: Question whether patient is accurate historian given meds   Cognition Cognition Arousal/Alertness: Awake/alert Overall Cognitive Status: Impaired Orientation Level: Oriented X4 Following Commands: Follows one step commands inconsistently;Follows multi-step commands inconsistently;Follows one step commands with increased time;Follows multi-step commands with increased time Safety/Judgement: Decreased awareness of safety precautions;Decreased safety judgement for tasks assessed Decreased Safety/Judgement: Decreased awareness of need for assistance Safety/Judgement - Other Comments: decreased cognition questionably secondary to medications Sensation/Coordination Sensation Light Touch: Impaired Detail Light Touch Impaired Details: Absent RLE;Absent LLE Stereognosis: Not tested Hot/Cold: Not tested Proprioception: Impaired by gross assessment Coordination Gross Motor Movements are Fluid and Coordinated: Yes Fine Motor Movements are Fluid and Coordinated: Yes Extremity Assessment RUE Assessment RUE Assessment: Within Functional Limits LUE Assessment LUE Assessment: Within Functional Limits RLE Assessment RLE Assessment: Exceptions to [facility] RLE PROM (degrees) Overall PROM Right Lower Extremity: Within functional limits for tasks assessed RLE Strength RLE Overall Strength: Deficits RLE Overall Strength Comments: 3/5 except dorsiflexion is 1/5 Right Ankle Dorsiflexion: 1/5 LLE Assessment LLE Assessment: Exceptions to WFL LLE PROM (degrees) Overall PROM Left Lower Extremity: Within functional limits for tasks assessed LLE Strength LLE Overall  Strength: Deficits LLE Overall Strength Comments: 3-/5 except dorsiflexion 1/5 Left Ankle Dorsiflexion: 1/5 Mobility (including Balance) Bed Mobility Bed Mobility: Yes Rolling Left: 3: Mod assist;With rail Rolling Left Details (indicate cue type and reason): mod verbal cues to follow precautions Left Sidelying to Sit: 3: Mod assist;With rails;HOB flat Left Sidelying to Sit Details (indicate cue type and reason): needs cues for back precautions Transfers Sit to Stand: 1: +2 Total assist;Patient percentage (comment) (pt. = 50%) Sit to Stand Details (indicate cue type and reason): mod verbal cues for hand and foot placement and physical assist for anterior translation of pelvis Stand to Sit: 3: Mod assist;With armrests;With upper extremity assist;To chair/3-in-1 Stand to Sit Details: verbal cues for hand placement Stand Pivot Transfers: 3: Mod assist Stand Pivot Transfer Details (indicate cue type and reason): verbal cues for postural control as pt. placing weight on heels for transfer with RW. Needed asssit to move LEs and guide RW.   Squat Pivot Transfers: Not tested (comment) Ambulation/Gait Ambulation/Gait Assistance: Not tested (comment) Stairs: No Wheelchair Mobility Wheelchair Mobility: No  Posture/Postural Control Posture/Postural Control: No significant limitations Balance Balance Assessed: No Exercise    End of Session PT - End of Session Equipment Utilized During Treatment: Gait belt;Back brace Activity Tolerance: Patient limited by pain Patient left: in chair;with call bell in reach Nurse Communication: Mobility status for transfers General Behavior During Session: Lethargic Cognition: Impaired Cognitive Impairment: Pt. cognition impaired secondary to meds.  Patient had slowed processing as well as spilled a drink in her lap secondary to lethargy.  INGOLD,Aislee Landgren 02/13/2011, 2:15 PM [facility] Acute Rehabilitation [phone] (pager)

## 2011-02-13 NOTE — Progress Notes (Addendum)
Received many calls from 2000 regarding patient.  Now has temp of 105 which is worse than what it was, with that she has sinus tach, sometimes to 150, family very concerned.  I have called CCM to eval.  Patient is alert and on no o2, but is complaining of pain and is "delusional " according to the family.  She is still on  The PCA but is very painful.    Is on zosyn and vanc but seems to be spiking thru.     DDX would be intraabdominal issue, also had back surgery not that long ago.... PNA would be in defferential as well although no O2 needed, consider further imaging of chest vs Abdomen also may need broadening of abx.    Very much appreciate CCM assist.  Will also transfer to step down.  Also spoke to CCS who felt a CT scan would be indicated, will order  Jenna Parish, MD

## 2011-02-13 NOTE — Significant Event (Addendum)
Rapid Response Event Note  Overview: Time Called: 1237 Arrival Time: 1238 Event Type: Other (Comment) (febrile, tachycardia)  Initial Focused Assessment: Called by bedside nurse to provide assistance due to patient being tachycardic with elevated temperature; patient alert and responsive, shivering; heart rate 152, temp 105(rectally)   Interventions: Dr Moshe Cipro notified per bedside nurse; new orders obtained; Tylenol suppository given; cooling blanket placed   Event Summary: Patient remained on department 2000   at  Notification received from charge nurse on department 2000 @ 1500 of new orders received from physician to transfer patient to stepdown unit for closer monitoring.    at          [employer], [place]

## 2011-02-13 NOTE — Consult Note (Signed)
ANTIBIOTIC CONSULT NOTE - FOLLOW UP  Pharmacy Consult for Diflucan Indication: Empiric fungal coverage.  Allergies  Allergen Reactions  . Contrast Media (Iodinated Diagnostic Agents)     Seziure  . Iohexol Other (See Comments)    Reaction is convulsions    Patient Measurements: Height: 5\' 5"  (165.1 cm) Weight: 138 lb 3.7 oz (62.7 kg) IBW/kg (Calculated) : 57   Vital Signs: Tm 105 Tachycardic  Intake/Output from previous day: 11/27 0701 - 11/28 0700 In: 1808.5 [P.O.:717; I.V.:941.5; IV Piggyback:150] Out: 850 [Stool:850] Intake/Output from this shift: Total I/O In: 910 [P.O.:240; I.V.:620; IV Piggyback:50] Out: 375 [Stool:375]  Labs:  [facility] 02/13/11 0500 02/12/11 0246 02/11/11 0455  WBC 18.6* 17.7* 20.1*  HGB 8.0* 8.1* 8.4*  PLT 579* 458* 425*  LABCREA -- -- --  CREATININE 5.81* 3.67* 8.44*   Estimated Creatinine Clearance: 9.3 ml/min (by C-G formula based on Cr of 5.81).   Microbiology: Recent Results (from the past 720 hour(s))  SURGICAL PCR SCREEN     Status: Normal   Collection Time   01/25/11  9:10 AM      Component Value Range Status Comment   MRSA, PCR NEGATIVE  NEGATIVE  Final    Staphylococcus aureus NEGATIVE  NEGATIVE  Final   CULTURE, BLOOD (ROUTINE X 2)     Status: Normal   Collection Time   02/03/11  4:09 PM      Component Value Range Status Comment   Specimen Description BLOOD LEFT ARM   Final    Special Requests     Final    Value: BOTTLES DRAWN AEROBIC AND ANAEROBIC 8CC AER, [place] ANA   Setup Time [id number]   Final    Culture NO GROWTH 5 DAYS   Final    Report Status 02/09/2011 FINAL   Final   CULTURE, BLOOD (ROUTINE X 2)     Status: Normal   Collection Time   02/03/11  4:19 PM      Component Value Range Status Comment   Specimen Description BLOOD LEFT HAND   Final    Special Requests BOTTLES DRAWN AEROBIC ONLY [place]   Final    Setup Time [id number]   Final    Culture NO GROWTH 5 DAYS   Final    Report Status 02/09/2011 FINAL    Final   CULTURE, BLOOD (ROUTINE X 2)     Status: Normal   Collection Time   02/06/11  5:40 PM      Component Value Range Status Comment   Specimen Description BLOOD HEMODIALYSIS GRAFT   Final    Special Requests BOTTLES DRAWN AEROBIC AND ANAEROBIC 10CC   Final    Setup Time [id number]   Final    Culture NO GROWTH 5 DAYS   Final    Report Status 02/13/2011 FINAL   Final   CULTURE, BLOOD (ROUTINE X 2)     Status: Normal   Collection Time   02/06/11  6:40 PM      Component Value Range Status Comment   Specimen Description BLOOD HEMODIALYSIS GRAFT   Final    Special Requests BOTTLES DRAWN AEROBIC AND ANAEROBIC 10CC   Final    Setup Time [id number]   Final    Culture NO GROWTH 5 DAYS   Final    Report Status 02/13/2011 FINAL   Final   CULTURE, BLOOD (ROUTINE X 2)     Status: Normal (Preliminary result)   Collection Time   02/11/11  8:54 PM  Component Value Range Status Comment   Specimen Description BLOOD LEFT ARM   Final    Special Requests     Final    Value: BOTTLES DRAWN AEROBIC AND ANAEROBIC 10CC AER 5CC ANA   Setup Time [id number]   Final    Culture     Final    Value:        BLOOD CULTURE RECEIVED NO GROWTH TO DATE CULTURE WILL BE HELD FOR 5 DAYS BEFORE ISSUING A FINAL NEGATIVE REPORT   Report Status PENDING   Incomplete   CULTURE, BLOOD (ROUTINE X 2)     Status: Normal (Preliminary result)   Collection Time   02/11/11  8:54 PM      Component Value Range Status Comment   Specimen Description BLOOD LEFT HAND   Final    Special Requests BOTTLES DRAWN AEROBIC AND ANAEROBIC 10CC   Final    Setup Time [id number]   Final    Culture     Final    Value:        BLOOD CULTURE RECEIVED NO GROWTH TO DATE CULTURE WILL BE HELD FOR 5 DAYS BEFORE ISSUING A FINAL NEGATIVE REPORT   Report Status PENDING   Incomplete     Anti-infectives     Start     Dose/Rate Route Frequency Ordered Stop   02/14/11 1200   vancomycin (VANCOCIN) 500 mg in sodium chloride 0.9 % 100 mL IVPB          500 mg 100 mL/hr over 60 Minutes Intravenous Every Hemodialysis 02/11/11 1122     02/12/11 1200   vancomycin (VANCOCIN) 500 mg in sodium chloride 0.9 % 100 mL IVPB  Status:  Discontinued        500 mg 100 mL/hr over 60 Minutes Intravenous Every T-Th-Sa (Hemodialysis) 02/09/11 2050 02/11/11 1120   02/11/11 1600   vancomycin (VANCOCIN) IVPB 1000 mg/200 mL premix        1,000 mg 200 mL/hr over 60 Minutes Intravenous  Once 02/11/11 1119     02/09/11 2200  piperacillin-tazobactam (ZOSYN) IVPB 2.25 g       2.25 g 100 mL/hr over 30 Minutes Intravenous 3 times per day 02/09/11 2050     02/09/11 2100   piperacillin-tazobactam (ZOSYN) IVPB 3.375 g  Status:  Discontinued        3.375 g 12.5 mL/hr over 240 Minutes Intravenous Every 8 hours 02/09/11 2034 02/09/11 2050          Assessment:  60 yo female with hx ESRD, anemia, mult back surgeries admitted 02/09/2011 with ischemic bowel and underwent exp lap with R hemicolectomy and ileostomy 11/24. Recovering on floor 11/28 when she developed tachycardia, fever (105) and worsening abd pain.  Diflucan ordered for fungal coverage.    Goal of Therapy:  Addition of antifungal agent in patient with new fever, worsening abdominal pain in spite of Vancomycin and Zosyn coverage.  Plan:  Begin Diflucan 200mg  IV q 48 hours.  Kennan Detter, Craig Guess, Pharm.D. 02/13/2011 4:39 PM

## 2011-02-13 NOTE — Plan of Care (Signed)
Problem: Phase III Progression Outcomes Goal: Stoma site, output assessed Outcome: Completed/Met Date Met:  02/13/11 Stoma pink, output adequate

## 2011-02-13 NOTE — Progress Notes (Signed)
02/13/2011 3:10 PM Nursing Note. Upon transfer to 2000, patient heart rate noted at 150-160 sinus tach sustained, rectal temperature obtained at 105.2. Dr. Moshe Cipro made aware, orders recived. Cooling blanket applied as well as PR tylenol administered per orders. Patient family member at bedside and made aware of current events. BP 110/75.  Continued to closely monitor patient heart rate and temperature while on cooling blanket. Patient's daughter voiced concern about patient status as well as plan/level of care needed. Dr. Moshe Cipro made aware, orders received to transfer to stepdown bed. Vital signs prior to transfer were HR 123, O2 93% on 2Lo2 [place], BP 100/74, Temp 101.9. Patient transferred to [id number] per orders.  Daeshawn Redmann, Arville Lime

## 2011-02-13 NOTE — Plan of Care (Signed)
Problem: Phase III Progression Outcomes Goal: Discharge plan remains appropriate-arrangements made Outcome: Progressing OT recommending CIR at D/C and will follow acutely. Thanks!  Lavona Mound, OTR/L Pager: [phone] 02/13/2011 .

## 2011-02-13 NOTE — Plan of Care (Signed)
Problem: Phase II Progression Outcomes Goal: Progress activity as tolerated unless otherwise ordered Outcome: Progressing PT evaluation completed.  Patient somewhat lethargic and in a lot of pain - using PCA a lot.  Feel patient should be appropriate to go back to Rehab once acute pain is under control.  Continue acute PT.  Thanks.  [facility] [phone] (pager)

## 2011-02-13 NOTE — Progress Notes (Signed)
Elink concerned about HR in 140's after PT got pt up to chair.  RN aware, Elink concerned about Hbg 8, increased WBC, and HR.  Pt was not far from HR of 137 prior to getting up in chair, MD was aware of HR in 130's this am.  At approx 1145 pt HR in 150-160's gave 2.14ml Metoprolol and called MD to express concerns of Elink and to make sure that patient is still appropriate for transfer.  HR now at 130 and BP is 118/73 it was noted that during highest HR pt had been shivering--->temp is 100.4.  MD is still okay with transfer, no new orders received.

## 2011-02-13 NOTE — Progress Notes (Addendum)
POD# 4  Assessment/Plan:   1. Acute ischemic colitis -Right hemicolectomy, ileostomy 02/09/2011 Full liquids. Ostomy has put out 800 last 12 hours.  Will leave in ICU for at least one more day.   2.  ESRD - dialysis MWF   3.  Recent back surgery - 01/30/2011 - [facility] Foot drop. Getting PT. ? Ability to ambulate.   4.  Tachycardia Probably volume depleted secondary to ileostomy output.  Will bolus and talk  to renal service. Already on beta blockers.   Baseline fluid at 40 cc/hour. To check CVP.   5.  Chronic anemia.  6.  DM - stable  7.  Infection - controlled, on zosyn.    LOS: 4 days   Subjective:  Drowsy.  No specific complaint.  Objective:   Filed Vitals:   02/13/11 0600  BP: 132/73  Pulse: 122  Temp:   Resp: 10     Intake/Output from previous day:  11/27 0701 - 11/28 0700 In: 1768.5 [P.O.:717; I.V.:901.5; IV Piggyback:150] Out: 850 [Stool:850]  Intake/Output this shift:      Physical Exam:   General: WN AA female who is alert, but mildly confused.    HEENT: Normal. Pupils equal. Good dentition. .   Lungs: clear   Abdomen: bowel sounds present.  Ostomy with output liquid.   Wound: abdomen and back okay.   Neurologic:  Foot drop with overall weakness.   Psychiatric: Has normal mood and affect.   Lab Results:    Basename 02/13/11 0500 02/12/11 0246  WBC 18.6* 17.7*  HGB 8.0* 8.1*  HCT 23.9* 23.3*  PLT 579* 458*    BMET   Basename 02/12/11 0246 02/11/11 0455  NA 139 135  K 4.1 5.0  CL 100 96  CO2 28 22  GLUCOSE 102* 104*  BUN 23 80*  CREATININE 3.67* 8.44*  CALCIUM 7.3* 7.1*    PT/INR  No results found for this basename: LABPROT:2,INR:2 in the last 72 hours  ABG  No results found for this basename: PHART:2,PCO2:2,PO2:2,HCO3:2 in the last 72 hours   Studies/Results:  No results found.   Anti-infectives:   Anti-infectives     Start     Dose/Rate Route Frequency Ordered Stop   02/14/11 1200   vancomycin (VANCOCIN) 500 mg in sodium  chloride 0.9 % 100 mL IVPB        500 mg 100 mL/hr over 60 Minutes Intravenous Every Hemodialysis 02/11/11 1122     02/12/11 1200   vancomycin (VANCOCIN) 500 mg in sodium chloride 0.9 % 100 mL IVPB  Status:  Discontinued        500 mg 100 mL/hr over 60 Minutes Intravenous Every T-Th-Sa (Hemodialysis) 02/09/11 2050 02/11/11 1120   02/11/11 1600   vancomycin (VANCOCIN) IVPB 1000 mg/200 mL premix        1,000 mg 200 mL/hr over 60 Minutes Intravenous  Once 02/11/11 1119     02/09/11 2200  piperacillin-tazobactam (ZOSYN) IVPB 2.25 g       2.25 g 100 mL/hr over 30 Minutes Intravenous 3 times per day 02/09/11 2050     02/09/11 2100   piperacillin-tazobactam (ZOSYN) IVPB 3.375 g  Status:  Discontinued        3.375 g 12.5 mL/hr over 240 Minutes Intravenous Every 8 hours 02/09/11 2034 02/09/11 2050           Alphonsa Overall, MD, [facility] Pager: [phone],   [facility] Office: [phone] 02/13/2011

## 2011-02-13 NOTE — Progress Notes (Signed)
Occupational Therapy Evaluation Patient Details Name: BRIONNAH BERSTLER MRN: [id number] DOB: 03-30-1950 Today's Date: 02/13/2011  Problem List:  Patient Active Problem List  Diagnoses  . Lumbar stenosis with neurogenic claudication  . Spondylolisthesis of lumbar region  . End stage renal disease on dialysis  . Hypertension  . Unspecified viral hepatitis C without hepatic coma  . Radiculopathy  . Anemia of chronic kidney failure  . Acute ischemic colitis    Past Medical History:  Past Medical History  Diagnosis Date  . Seizures     r/t HTN in 1990's x 1  . Brain aneurysm   . Blood transfusion 1990's    r/t Kidney removal surgery  . Arthritis     Back  . Anemia   . Umbilical hernia age 35  . Heart murmur     Born with heart mumur, does not require follow up per pt  . Hypertension     Does not see a heart doctor, had pre transplant stress test at [facility]    . Diabetes mellitus     Borderline  . End stage renal disease on dialysis     T/Th/Sat dialysis on [employer]  . End stage renal disease on dialysis     01-29-2011  . Hepatitis C    Past Surgical History:  Past Surgical History  Procedure Date  . Nephrectomy 2010    right side done at [facility] on transplant list  . Umbilical hernia repair age 12- 25  . Appendectomy 1960's  . Vascular surgery     right arm dialysis graft  . Tonsillectomy     as a child  . Rib resection     d/t kidney removal  . Back surgery 01/30/2011    laminectomy  . Hernia repair   . Laparotomy 02/09/2011    Procedure: EXPLORATORY LAPAROTOMY;  Surgeon: Harl Bowie, MD;  Location: [place];  Service: General;  Laterality: N/A;  . Partial colectomy 02/09/2011    Procedure: PARTIAL COLECTOMY;  Surgeon: Harl Bowie, MD;  Location: [place];  Service: General;  Laterality: Right;  . Colostomy 02/09/2011    Procedure: COLOSTOMY;  Surgeon: Harl Bowie, MD;  Location: [place];  Service: General;  Laterality: Right;    OT  Assessment/Plan/Recommendation OT Assessment Clinical Impression Statement: Pt. will benefit from OT to decrease the burden of care at D/C to next venue of care. OT Recommendation/Assessment: Patient will need skilled OT in the acute care venue OT Problem List: Decreased strength;Decreased activity tolerance;Impaired balance (sitting and/or standing);Decreased cognition;Decreased safety awareness;Decreased knowledge of use of DME or AE;Decreased knowledge of precautions Barriers to Discharge: None OT Therapy Diagnosis : Generalized weakness;Acute pain OT Plan OT Frequency: Min 2X/week OT Treatment/Interventions: Self-care/ADL training;Energy conservation;DME and/or AE instruction;Therapeutic activities;Patient/family education;Balance training OT Recommendation Recommendations for Other Services: Rehab consult Follow Up Recommendations: Inpatient Rehab Equipment Recommended: Defer to next venue Individuals Consulted Consulted and Agree with Results and Recommendations: Patient OT Goals Acute Rehab OT Goals OT Goal Formulation: With patient Time For Goal Achievement: 2 weeks ADL Goals Pt Will Perform Grooming: with set-up;with supervision;Standing at sink ADL Goal: Grooming - Progress: Progressing toward goals Pt Will Perform Lower Body Bathing: with min assist;Sitting in shower ADL Goal: Lower Body Bathing - Progress: Progressing toward goals Pt Will Perform Lower Body Dressing: with min assist;Sit to stand from bed;with adaptive equipment ADL Goal: Lower Body Dressing - Progress: Progressing toward goals Pt Will Transfer to Toilet: Stand pivot transfer;with supervision ADL Goal: Toilet  Transfer - Progress: Progressing toward goals  OT Evaluation Precautions/Restrictions  Precautions Precautions: Back Precaution Comments: Back brace Required Braces or Orthoses: Yes Spinal Brace: Lumbar corset;Applied in sitting position Other Brace/Splint: Aspen corset Restrictions Weight  Bearing Restrictions: No Prior Functioning Home Living Lives With: Alone Receives Help From: Friend(s);Family Type of Home: House Home Layout: One level Home Access: Stairs to enter Entrance Stairs-Rails: Right Entrance Stairs-Number of Steps: 2 Bathroom Shower/Tub: Chiropodist: Standard Home Adaptive Equipment: Wheelchair - manual;Walker - rolling;Bedside commode/3-in-1 Additional Comments: Needs a handheld showerhead and shower seat Prior Function Level of Independence: Independent with basic ADLs;Independent with homemaking with ambulation;Independent with transfers Driving: Yes ADL ADL Eating/Feeding: Simulated;Set up Eating/Feeding Details (indicate cue type and reason): With drinking from cup Where Assessed - Eating/Feeding: Chair Grooming: Performed;Wash/dry face;Minimal assistance Grooming Details (indicate cue type and reason): To initiate task with hand to face Where Assessed - Grooming: Sitting, chair Upper Body Bathing: Not assessed Lower Body Bathing: Not assessed Upper Body Dressing: Performed;Moderate assistance Upper Body Dressing Details (indicate cue type and reason): With donning gown Lower Body Dressing: Simulated;+1 Total assistance Lower Body Dressing Details (indicate cue type and reason): Pt. unable to reach bilateral feet to don socks Where Assessed - Lower Body Dressing: Sit to stand from bed Toilet Transfer: Simulated;+2 Total assistance;Comment for patient % (pt=50%) Toilet Transfer Details (indicate cue type and reason): with bed to chair Toilet Transfer Method: Stand pivot Toileting - Clothing Manipulation: Simulated;+1 Total assistance Where Assessed - Toileting Clothing Manipulation: Standing Toileting - Hygiene: Simulated;+1 Total assistance Where Assessed - Toileting Hygiene: Sit to stand from 3-in-1 or toilet Tub/Shower Transfer: Not assessed Tub/Shower Transfer Method: Not assessed Equipment Used: Rolling walker ADL  Comments: Pt. completed bed-chair with max verbal cues for upright position and hand placement on RW due to pt. not following commands and requiring intermittent hand over hand placement on RW due to confusion. Vision/Perception  Vision - History Baseline Vision: Bifocals Patient Visual Report: No change from baseline Vision - Assessment Eye Alignment: Within Functional Limits Vision Assessment: Vision not tested Cognition Cognition Arousal/Alertness: Awake/alert Overall Cognitive Status: Impaired Orientation Level: Oriented X4 Following Commands: Follows one step commands inconsistently Safety/Judgement: Decreased awareness of safety precautions;Decreased safety judgement for tasks assessed Decreased Safety/Judgement: Decreased awareness of need for assistance Safety/Judgement - Other Comments: decreased cognition due to medications Sensation/Coordination   Extremity Assessment RUE Assessment RUE Assessment: Within Functional Limits LUE Assessment LUE Assessment: Within Functional Limits Mobility  Bed Mobility Bed Mobility: Yes Rolling Left: 3: Mod assist;With rail Rolling Left Details (indicate cue type and reason): Mod verbal cues to follow precautions Left Sidelying to Sit: 3: Mod assist;With rails;HOB flat Transfers Transfers: Yes Sit to Stand: 1: +2 Total assist;Patient percentage (comment);From bed (pt=50%) Sit to Stand Details (indicate cue type and reason): Mod verbal cues for hand placement Stand to Sit: 3: Mod assist;With armrests    End of Session OT - End of Session Equipment Utilized During Treatment: Gait belt;Back brace Activity Tolerance: Patient limited by fatigue;Patient limited by pain Patient left: in chair;with call bell in reach Nurse Communication: Mobility status for transfers General Behavior During Session: Lethargic Cognition: Impaired  Co-Evaluation with Adonis Huguenin, OTR/L Pager [phone] 02/13/2011, 1:16 PM

## 2011-02-13 NOTE — Consult Note (Signed)
Patient name: [name] Medical record number: [id number] Date of birth: Apr 12, 1950 Age: 60 y.o. Gender: female PCP: No primary provider on file.  Date: 02/13/2011 Reason for Consult:  tachycardia, fever Referring Physician:   Moshe Cipro (Renal)  Brief history 60 yo female with hx ESRD, anemia, mult back surgeries admitted 02/09/2011 with ischemic bowel and underwent exp lap with R hemicolectomy and ileostomy 11/24.  Recovering on floor 11/28 when she developed tachycardia, fever (105) and worsening abd pain and PCCM consulted.   Lines/tubes R IJ CVL 11/24>>>  Culture data/sepsis markers BCx 2 11/21>>>neg BCx2 11/26>>> Urine 11/28>>> Lactate 11/28>>> Pct 11/28>>>  Antibiotics Vanc 11/26>>> Zosyn 11/26>>>  Events/studies  HPI: 60 yo female with hx ESRD, anemia, mult back surgeries admitted 02/09/2011 with ischemic bowel and underwent exp lap with R hemicolectomy and ileostomy 11/24.  Recovering on floor 11/28 when she developed tachycardia, fever (105) and worsening abd pain and PCCM consulted.   Past Medical History  Diagnosis Date  . Seizures     r/t HTN in 1990's x 1  . Brain aneurysm   . Blood transfusion 1990's    r/t Kidney removal surgery  . Arthritis     Back  . Anemia   . Umbilical hernia age 61  . Heart murmur     Born with heart mumur, does not require follow up per pt  . Hypertension     Does not see a heart doctor, had pre transplant stress test at [facility]    . Diabetes mellitus     Borderline  . End stage renal disease on dialysis     T/Th/Sat dialysis on [employer]  . End stage renal disease on dialysis     01-29-2011  . Hepatitis C     Past Surgical History  Procedure Date  . Nephrectomy 2010    right side done at [facility] on transplant list  . Umbilical hernia repair age 58- 52  . Appendectomy 1960's  . Vascular surgery     right arm dialysis graft  . Tonsillectomy     as a child  . Rib resection     d/t kidney removal  . Back  surgery 01/30/2011    laminectomy  . Hernia repair   . Laparotomy 02/09/2011    Procedure: EXPLORATORY LAPAROTOMY;  Surgeon: Harl Bowie, MD;  Location: [place];  Service: General;  Laterality: N/A;  . Partial colectomy 02/09/2011    Procedure: PARTIAL COLECTOMY;  Surgeon: Harl Bowie, MD;  Location: [place];  Service: General;  Laterality: Right;  . Colostomy 02/09/2011    Procedure: COLOSTOMY;  Surgeon: Harl Bowie, MD;  Location: [place];  Service: General;  Laterality: Right;    Family History  Problem Relation Age of Onset  . Anesthesia problems Neg Hx     Social History:  reports that she quit smoking about 5 months ago. Her smoking use included Cigarettes. She has a 24 pack-year smoking history. She has never used smokeless tobacco. She reports that she drinks about 1.2 ounces of alcohol per week. She reports that she does not use illicit drugs.  Allergies:  Allergies  Allergen Reactions  . Contrast Media (Iodinated Diagnostic Agents)     Seziure  . Iohexol Other (See Comments)    Reaction is convulsions    Home Medications:  Prior to Admission medications   Medication Sig Start Date End Date Taking? Authorizing Provider  calcium carbonate (TUMS - DOSED IN MG ELEMENTAL CALCIUM) 500 MG chewable  tablet Chew 1-2 tablets by mouth 3 (three) times daily.     Yes Historical Provider, MD  hydrOXYzine (ATARAX/VISTARIL) 25 MG tablet Take 25 mg by mouth every 6 (six) hours as needed. For itching   Yes Historical Provider, MD  naproxen sodium (ANAPROX) 220 MG tablet Take 220 mg by mouth 2 (two) times daily as needed. For pain    Yes Historical Provider, MD   Current medications:    . sodium chloride   Intravenous Once  . calcium carbonate  400 mg of elemental calcium Oral TID WC  . darbepoetin (ARANESP) injection - DIALYSIS  200 mcg Intravenous Q Tue-HD  . enoxaparin  30 mg Subcutaneous Q24H  . feeding supplement (NEPRO CARB STEADY)  237 mL Oral TID WC  . morphine    Intravenous Q4H  . piperacillin-tazobactam (ZOSYN)  IV  2.25 g Intravenous Q8H  . vancomycin  1,000 mg Intravenous Once     ROS - A comprehensive review of systems was negative except for: Constitutional: positive for chills, fevers and sweats Gastrointestinal: positive for abdominal pain.  All other systems reviewed and were neg.   Vitals: Filed Vitals:   02/13/11 1248 02/13/11 1253 02/13/11 1337 02/13/11 1508  BP: 111/75 110/74  100/64  Pulse: 162 152  123  Temp: 105.2 F (40.7 C) 105 F (40.6 C) 104.4 F (40.2 C) 101.9 F (38.8 C)  TempSrc: Rectal Rectal Rectal Rectal  Resp: 22 22  20   Height:      Weight:      SpO2: 91% 91%  93%       Intake/Output Summary (Last 24 hours) at 02/13/11 1516 Last data filed at 02/13/11 1000  Gross per 24 hour  Intake   2107 ml  Output   1225 ml  Net    882 ml   Physical exam General: chronically ill appearing female, NAD Neuro: Awake and alert, appropriate CV: s1s2 rrr, no m/r/g PULM:  resps even non labored on [place], diminished bases, few scattered ronchi GI: abd soft, midline incision with staples intact, tender RLQ, hypaoctive bs Extremities: warm and dry, no edema   Radiology  none  LAB RESULT Lab Results  Component Value Date   CREATININE 5.81* 02/13/2011   BUN 36* 02/13/2011   NA 143 02/13/2011   K 4.6 02/13/2011   CL 103 02/13/2011   CO2 26 02/13/2011   Lab Results  Component Value Date   WBC 18.6* 02/13/2011   HGB 8.0* 02/13/2011   HCT 23.9* 02/13/2011   MCV 86.3 02/13/2011   PLT 579* 02/13/2011   No results found for this basename: ALT, AST, GGT, ALKPHOS, BILITOT   No results found for this basename: INR, PROTIME     Assessment and Plan  1. Tachycardia - likely multifactorial r/t pain and fever +/- volume depletion with high ileostomy outpt.  PLAN -  Defervesce Gentle volume Pain control    2. Fever - unknown etiology - s/p hemicolectomy for ischemic bowel.  Likely too soon post op for abscess.   Surgery following.   PLAN -  Cont broad spectrum abx Add anti-fungal Tylenol Urine culture ? CT abd/pelvis - will defer to surgery  Check lactate, pct   3. ESRD - per renal.  Plan HD 11/29 PLAN -  Per renal  F/u chem  4. Ischemic bowel - s/p hemicolectomy.  PLAN -  Per surgery See fever  [facility] 02/13/2011, 3:16 PM   Will add diflucan for antifungal coverage, defer to surgery for rescan  of abdomen.  Fluid resuscitation today.  Move to SDU.  Will monitor there for deterioration.  Patient has high potential for deterioration.  Patient seen and examined, agree with above note.  I dictated the care and orders written for this patient under my direction.  Jennet Maduro, M.D.

## 2011-02-14 ENCOUNTER — Inpatient Hospital Stay (HOSPITAL_COMMUNITY): Payer: Medicare Other

## 2011-02-14 ENCOUNTER — Encounter (HOSPITAL_COMMUNITY): Payer: Self-pay | Admitting: Radiology

## 2011-02-14 DIAGNOSIS — R509 Fever, unspecified: Secondary | ICD-10-CM

## 2011-02-14 DIAGNOSIS — A419 Sepsis, unspecified organism: Secondary | ICD-10-CM

## 2011-02-14 DIAGNOSIS — R000 Tachycardia, unspecified: Secondary | ICD-10-CM

## 2011-02-14 DIAGNOSIS — I959 Hypotension, unspecified: Secondary | ICD-10-CM

## 2011-02-14 LAB — BASIC METABOLIC PANEL
BUN: 43 mg/dL — ABNORMAL HIGH (ref 6–23)
Chloride: 100 mEq/L (ref 96–112)
GFR calc Af Amer: 7 mL/min — ABNORMAL LOW (ref >90)
GFR calc non Af Amer: 6 mL/min — ABNORMAL LOW (ref >90)
Glucose, Bld: 100 mg/dL — ABNORMAL HIGH (ref 70–99)
Potassium: 4.4 mEq/L (ref 3.5–5.1)
Sodium: 140 mEq/L (ref 135–145)

## 2011-02-14 LAB — GLUCOSE, CAPILLARY
Glucose-Capillary: 103 mg/dL — ABNORMAL HIGH (ref 70–99)
Glucose-Capillary: 89 mg/dL (ref 70–99)

## 2011-02-14 LAB — CBC
HCT: 19.4 % — ABNORMAL LOW (ref 36.0–46.0)
Hemoglobin: 6.5 g/dL — CL (ref 12.0–15.0)
MCH: 29 pg (ref 26.0–34.0)
MCV: 86.6 fL (ref 78.0–100.0)
RBC: 2.24 MIL/uL — ABNORMAL LOW (ref 3.87–5.11)
WBC: 18.1 10*3/uL — ABNORMAL HIGH (ref 4.0–10.5)

## 2011-02-14 LAB — PREPARE RBC (CROSSMATCH)

## 2011-02-14 LAB — HEPATIC FUNCTION PANEL
Albumin: 1.5 g/dL — ABNORMAL LOW (ref 3.5–5.2)
Alkaline Phosphatase: 146 U/L — ABNORMAL HIGH (ref 39–117)
Indirect Bilirubin: 0.1 mg/dL — ABNORMAL LOW (ref 0.3–0.9)

## 2011-02-14 LAB — DIFFERENTIAL
Basophils Relative: 1 % (ref 0–1)
Eosinophils Relative: 2 % (ref 0–5)
Lymphs Abs: 1.8 10*3/uL (ref 0.7–4.0)
Monocytes Absolute: 1.8 10*3/uL — ABNORMAL HIGH (ref 0.1–1.0)
Monocytes Relative: 10 % (ref 3–12)
Neutro Abs: 13.9 10*3/uL — ABNORMAL HIGH (ref 1.7–7.7)

## 2011-02-14 MED ORDER — SODIUM CHLORIDE 0.9 % IJ SOLN
INTRAMUSCULAR | Status: AC
  Filled 2011-02-14: qty 10

## 2011-02-14 MED ORDER — PANTOPRAZOLE SODIUM 40 MG IV SOLR
40.0000 mg | Freq: Two times a day (BID) | INTRAVENOUS | Status: DC
  Administered 2011-02-14 – 2011-02-22 (×16): 40 mg via INTRAVENOUS
  Filled 2011-02-14 (×19): qty 40

## 2011-02-14 MED ORDER — MORPHINE SULFATE (PF) 1 MG/ML IV SOLN
INTRAVENOUS | Status: AC
  Administered 2011-02-14: 4.41 mg via INTRAVENOUS
  Filled 2011-02-14: qty 25

## 2011-02-14 NOTE — Consult Note (Signed)
WOC ostomy consult  Stoma type/location: Ileostomy to right upper quadrant. Stomal assessment/size:  21/4 inches, red and viable, above skin level. Peristomal assessment:  Skin intact surrounding. Output  200cc liquid green drainage. Ostomy pouching: 1pc pouch with with velcro closure.   Education provided: Scientist, clinical (histocompatibility and immunogenetics) at bedside.  No family members present for teaching session.  Demonstrated pouch application.  Pt participated and asked appropriate questions.  Able to empty and close bottom of pouch.  Discussed pouching routines and ordering supplies. Placed on [place] discharge program.   [place] AST AFTER D/C.   Julien Girt, RN, MSN, [employer]  [phone]

## 2011-02-14 NOTE — Progress Notes (Signed)
Noted therapy evaluations. Feel patient will need to be readmitted to the inpatient acute rehabilitation unit once medically ready. I will follow her progress daily and discuss with Dr. Naaman Plummer . Please call with any questions.pager [phone].

## 2011-02-14 NOTE — Progress Notes (Addendum)
Subjective: Large bloody BM from rectum, the ostomy is still putting out brown liquid. HBG dropped from 8.0 to 6.5, tachycardia, SBP 120-101 range, is  being transfused. CT this AM [id number] with findings below. Pt in bed now after unit of PRBC, looks very comfortable, no distress.  Abdomen is not distended or more tender than before.  Objective: Vital signs in last 24 hours: Temp:  [97.3 F (36.3 C)-105.2 F (40.7 C)] 98.5 F (36.9 C) (11/29 1116) Pulse Rate:  [102-162] 105  (11/29 1116) Resp:  [12-25] 19  (11/29 1116) BP: (97-127)/(52-86) 105/67 mmHg (11/29 1116) SpO2:  [90 %-100 %] 94 % (11/29 0800) Weight:  [62.4 kg (137 lb 9.1 oz)-62.7 kg (138 lb 3.7 oz)] 137 lb 9.1 oz (62.4 kg) (11/29 0326) Last BM Date: 02/13/11  Intake/Output from previous day: 11/28 0701 - 11/29 0700 In: 1775.8 [P.O.:240; I.V.:1335.8; IV Piggyback:200] Out: 1375 [Stool:1375] Intake/Output this shift: Total I/O In: 200 [I.V.:150; IV Piggyback:50] Out: 200 [Stool:200]  General appearance: alert, cooperative and no distress Resp: clear to auscultation bilaterally GI: soft, non-tender; bowel sounds normal; no masses,  no organomegaly and Staple line looks good, ostomy is pink with brown liquid drainage, it is not tender or distended.  She notes no changes from yesterday.  Lab Results:   Basename 02/14/11 0420 02/13/11 0500  WBC 18.1* 18.6*  HGB 6.5* 8.0*  HCT 19.4* 23.9*  PLT 571* 579*    BMET  Basename 02/14/11 0420 02/13/11 0500  NA 140 143  K 4.4 4.6  CL 100 103  CO2 26 26  GLUCOSE 100* 111*  BUN 43* 36*  CREATININE 6.68* 5.81*  CALCIUM 7.3* 7.6*   PT/INR No results found for this basename: LABPROT:2,INR:2 in the last 72 hours   Studies/Results: Ct Abdomen Pelvis Wo Contrast  02/14/2011  *RADIOLOGY REPORT*  Clinical Data: Abdominal pain; status post right-sided hemicolectomy.  Assess for abscess.  CT ABDOMEN AND PELVIS WITHOUT CONTRAST  Technique:  Multidetector CT imaging of the  abdomen and pelvis was performed following the standard protocol without intravenous contrast.  Comparison: CT of the abdomen and pelvis performed 02/08/2011  Findings: Small bilateral pleural effusions are noted, left greater than right, with partial consolidation of the left lower lobe. This may reflect atelectasis or pneumonia.  The heart is mildly enlarged.  Scattered coronary artery calcifications are seen.  Trace residual free air is noted scattered about the hepatic dome, likely postoperative in nature.  Along the site of right-sided hemicolectomy, there are multiple collections of fluid, demonstrating significant heterogeneity and scattered associated foci of air.  Given the lack of contrast, evaluation for abscess is significantly suboptimal.  Some of the collections with increased attenuation likely reflect hematomas, while there is more diffuse edema involving much of the retroperitoneal and intraperitoneal fat.  An apparent small air- fluid level is noted along the right lateral abdominal wall at the right lower quadrant, with fluid tracking adjacent to the right psoas muscle.  Given clinical concern, evolving abscess cannot be excluded.  The largest collections of fluid are seen at the right iliac fossa, tracking superiorly along the right lateral abdominal wall, and at the right renal fossa.  The liver is unremarkable in appearance.  Scattered calcifications within the spleen appear vascular in nature.  There is persistent distension of the gallbladder. Prominence of the pancreatic duct to 0.6 cm appears stable from the prior study.  There is no definite evidence of distal obstruction; the pancreas is otherwise grossly unremarkable in appearance.  The adrenal glands are within normal limits.  The patient is status post right-sided nephrectomy, with associated postoperative change.  Numerous cysts are seen arising from the left kidney, with marked atrophy of the left kidney.  There is no evidence of  hydronephrosis.  There is diffuse edema within the visualized omentum and mesentery, with associated areas of free fluid.  An apparent ileocolic anastomosis is noted at the right mid abdomen, with an overlying ileostomy.  There is significant soft tissue edema with respect to the ileostomy, and a small amount of surrounding fluid.  The remaining small bowel is grossly unremarkable in appearance. There is significant distension of the redundant transverse colon, again filled with fluid and air.  Mild diverticulosis is noted along the distal descending and proximal sigmoid colon; a rectal tube is noted ending at the mid sigmoid colon.  No acute vascular abnormalities are seen.  There is diffuse calcification along the abdominal aorta and its branches.  An anterior midline incision defect is noted, with overlying skin staples.  There is no evidence of wound dehiscence at this time.  The bladder is decompressed and not well assessed.  The uterus is grossly unremarkable, though difficult to assess due to surrounding fluid.  The ovaries are not well characterized; no suspicious adnexal masses are seen.  A small amount of free fluid is noted within the pelvis, demonstrating somewhat increased attenuation; this may simply be postoperative in nature.  No acute osseous abnormalities are seen.  The patient is status post lumbar spinal fusion at L4-S1, with associated decompression.  IMPRESSION:  1.  Multiple collections of fluid noted along the right side of the abdomen.  These are not well defined without contrast.  Some of the collections with increased attenuation likely reflect hematomas; evaluation is obscured by diffuse edema involving much of the retroperitoneal and intraperitoneal fat.  The largest collections of fluid are seen at the right iliac fossa, tracking superiorly along the right lateral abdominal wall and at the right renal fossa; given clinical concern, an evolving abscess cannot be excluded. 2.  Associated  scattered foci of free air noted about the fluid collections; trace residual free air about the hepatic dome is likely postoperative in nature. 3.  Significant edema noted at the ileostomy, with a small amount of surrounding fluid; the ileocolic anastomosis is grossly unremarkable in appearance, though there is significant persistent distension of the redundant sigmoid colon, again diffusely filled with fluid and air.  The patient's rectal tube is noted ending at the mid sigmoid colon; the sigmoid colon remains filled with fluid. 4.  Diffuse edema within the visualized omentum and mesentery, with associated areas of free fluid; small amount of free fluid within the pelvis demonstrates mildly increased attenuation.  This may simply be postoperative in nature. 5.  Small bilateral pleural effusions, left greater than right, with associated partial consolidation of the left lower lobe.  This may reflect atelectasis or possibly pneumonia. 6.  Persistent distension of the pancreatic duct to 0.6 cm, without definite evidence of distal obstruction; the gallbladder remains distended.  7.  Anterior midline incision effect, with overlying skin staples; no evidence of wound dehiscence at this time. 8.  Diffuse calcification along the abdominal aorta and its branches. 9.  Mild diverticulosis along the distal descending and proximal sigmoid colon. 10.  Mild cardiomegaly; scattered coronary artery calcifications seen. 11.  Marked left renal atrophy, with prominent cysts; status post right-sided nephrectomy.  Findings were discussed with Judson Roch RN on [phone] at 03:02  a.m. on 02/14/2011.  Original Report Authenticated By: Santa Lighter, M.D.   Dg Chest Port 1 View  02/13/2011  *RADIOLOGY REPORT*  Clinical Data: Fever.  Pneumonia.  PORTABLE CHEST - 1 VIEW  Comparison: Chest x-ray 02/09/2011.  Findings: The right IJ catheter is stable.  The NG tube has been removed.  The heart is mildly enlarged but stable.  There is tortuosity  and calcification of the thoracic aorta.  Bibasilar lung opacity could reflect atelectasis or infiltrates.  No effusions or edema.  IMPRESSION: Bibasilar airspace opacity, likely atelectasis but cannot exclude developing infiltrates.  Original Report Authenticated By: P. Kalman Jewels, M.D.    Anti-infectives: Anti-infectives     Start     Dose/Rate Route Frequency Ordered Stop   02/14/11 1200   vancomycin (VANCOCIN) 500 mg in sodium chloride 0.9 % 100 mL IVPB        500 mg 100 mL/hr over 60 Minutes Intravenous Every Hemodialysis 02/11/11 1122     02/13/11 1700   fluconazole (DIFLUCAN) IVPB 200 mg        200 mg 100 mL/hr over 60 Minutes Intravenous Every 48 hours 02/13/11 1642     02/12/11 1200   vancomycin (VANCOCIN) 500 mg in sodium chloride 0.9 % 100 mL IVPB  Status:  Discontinued        500 mg 100 mL/hr over 60 Minutes Intravenous Every T-Th-Sa (Hemodialysis) 02/09/11 2050 02/11/11 1120   02/11/11 1600   vancomycin (VANCOCIN) IVPB 1000 mg/200 mL premix        1,000 mg 200 mL/hr over 60 Minutes Intravenous  Once 02/11/11 1119     02/09/11 2200  piperacillin-tazobactam (ZOSYN) IVPB 2.25 g       2.25 g 100 mL/hr over 30 Minutes Intravenous 3 times per day 02/09/11 2050     02/09/11 2100   piperacillin-tazobactam (ZOSYN) IVPB 3.375 g  Status:  Discontinued        3.375 g 12.5 mL/hr over 240 Minutes Intravenous Every 8 hours 02/09/11 2034 02/09/11 2050         Current Facility-Administered Medications  Medication Dose Route Frequency Provider Last Rate Last Dose  . 0.9 %  sodium chloride infusion   Intravenous Continuous Darlina Sicilian, NP 50 mL/hr at 02/14/11 1000    . acetaminophen (TYLENOL) suppository 650 mg  650 mg Rectal Q4H PRN Kellie A Goldsborough   650 mg at 02/13/11 1258  . acetaminophen (TYLENOL) tablet 650 mg  650 mg Oral Q6H PRN Louis Meckel      . calcium carbonate (TUMS - dosed in mg elemental calcium) chewable tablet 400 mg of elemental calcium   400 mg of elemental calcium Oral TID WC Lucrezia Starch, MD   400 mg of elemental calcium at 02/14/11 0848  . darbepoetin (ARANESP) injection 200 mcg  200 mcg Intravenous Q Tue-HD Lucrezia Starch, MD      . diphenhydrAMINE (BENADRYL) injection 12.5 mg  12.5 mg Intravenous Q6H PRN Shann Medal, MD       Or  . diphenhydrAMINE (BENADRYL) 12.5 MG/5ML elixir 12.5 mg  12.5 mg Oral Q6H PRN Shann Medal, MD      . feeding supplement (NEPRO CARB STEADY) liquid 237 mL  237 mL Oral PRN Lucrezia Starch, MD      . feeding supplement (NEPRO CARB STEADY) liquid 237 mL  237 mL Oral TID WC Federico Flake, PA   237 mL at 02/14/11 0731  . fluconazole (DIFLUCAN) IVPB 200 mg  200 mg Intravenous Q48H Kellie A Goldsborough   200 mg at 02/13/11 1734  . lidocaine (XYLOCAINE) 1 % injection 5 mL  5 mL Intradermal PRN Lucrezia Starch, MD      . lidocaine-prilocaine (EMLA) cream 1 application  1 application Topical PRN Lucrezia Starch, MD      . metoprolol (LOPRESSOR) injection 5 mg  5 mg Intravenous Q6H PRN Rakesh V. Elsworth Soho, MD   2.5 mg at 02/13/11 1145  . morphine 1 MG/ML PCA injection   Intravenous Q4H Shann Medal, MD   5 mg at 02/14/11 1121  . naloxone [facility]) injection 0.4 mg  0.4 mg Intravenous PRN Shann Medal, MD       And  . sodium chloride 0.9 % injection 9 mL  9 mL Intravenous PRN Shann Medal, MD      . ondansetron [facility]) injection 4 mg  4 mg Intravenous Q6H PRN Shann Medal, MD      . ondansetron [facility]) tablet 4 mg  4 mg Oral Q6H PRN Harl Bowie, MD      . pantoprazole (PROTONIX) injection 40 mg  40 mg Intravenous Q12H Simonne Maffucci, MD   40 mg at 02/14/11 1121  . pentafluoroprop-tetrafluoroeth (GEBAUERS) aerosol 1 application  1 application Topical PRN Lucrezia Starch, MD      . piperacillin-tazobactam (ZOSYN) IVPB 2.25 g  2.25 g Intravenous Q8H Sandford Craze, PHARMD   2.25 g at 02/14/11 0959  . vancomycin (VANCOCIN) 500 mg in sodium chloride 0.9 % 100 mL IVPB  500 mg  Intravenous Q hemodialysis Stark Klein, MD   500 mg at 02/11/11 1634  . vancomycin (VANCOCIN) IVPB 1000 mg/200 mL premix  1,000 mg Intravenous Once Stark Klein, MD      . DISCONTD: 0.9 %  sodium chloride infusion  100 mL Intravenous PRN Lucrezia Starch, MD      . DISCONTD: 0.9 %  sodium chloride infusion  100 mL Intravenous PRN Lucrezia Starch, MD      . DISCONTD: acetaminophen (TYLENOL) tablet 650 mg  650 mg Oral Q6H PRN Rakesh V. Elsworth Soho, MD   650 mg at 02/12/11 1709  . DISCONTD: dextrose 5 %-0.9 % sodium chloride infusion   Intravenous Continuous Stark Klein, MD 40 mL/hr at 02/13/11 0310    . DISCONTD: enoxaparin (LOVENOX) injection 30 mg  30 mg Subcutaneous Q24H Lavonia Drafts [place], PHARMD   30 mg at 02/13/11 1642    Assessment/Plan  1.  Acute ischemic Colitis with R Hemicolectomy 02/09/11 now with bloody stool and drop in HBG. [Last Hgb at 1800 on 02/14/2011 - 9.4. I assume her bloody BM is due to more ischemic bowel or staple line bleed.  The colon is disconnected, so she can be fed. DN  02/14/2011]   2. ESRD - dialysis MWF   3. Recent back surgery - 01/30/2011 - [place]  Foot drop. Getting PT.    4. DM - stable   5. Infection - controlled, on zosyn.  Patient Active Problem List  Diagnoses  . Lumbar stenosis with neurogenic claudication  . Spondylolisthesis of lumbar region  . End stage renal disease on dialysis  . Hypertension  . Unspecified viral hepatitis C without hepatic coma  . Radiculopathy  . Anemia of chronic kidney failure  . Acute ischemic colitis   Plan:  Dr. Lucia Gaskins saw CT earlier today. Agree with transfusion, will watch, and see where her hbg goes. I will talk with DR. Zebadiah Willert.  LOS: 5 days   JENNINGS,WILLARD 02/14/2011  Stable at current time.  Complaining of back pain and pain down to her legs.  Question whether this has something to do with her recent back surgery. D.Lucia Gaskins  02/13/2101

## 2011-02-14 NOTE — Progress Notes (Signed)
Pt with another episode of BRBPR approximately 100cc.  Dr. Grandville Silos notified of above and of last Hgb 9.4.  Orders received for labs with next H/H.  Will continue to monitor.  Vista Lawman, RN

## 2011-02-14 NOTE — Progress Notes (Signed)
Patient ID: [name], female   DOB: 04/19/1950, 60 y.o.   MRN: [id number] Filed Vitals:   02/13/11 1956 02/13/11 2308 02/14/11 0326 02/14/11 0800  BP:  97/58 107/66 102/67  Pulse:  106 110 106  Temp:  98 F (36.7 C) 98.1 F (36.7 C) 98.1 F (36.7 C)  TempSrc:  Oral Oral Oral  Resp: 12 12 24 22   Height:      Weight:   62.4 kg (137 lb 9.1 oz)   SpO2: 95% 92% 95% 94%    CBC  Basename 02/14/11 0420 02/13/11 0500  WBC 18.1* 18.6*  HGB 6.5* 8.0*  HCT 19.4* 23.9*  PLT 571* 579*   BMET  Basename 02/14/11 0420 02/13/11 0500  NA 140 143  K 4.4 4.6  CL 100 103  CO2 26 26  GLUCOSE 100* 111*  BUN 43* 36*  CREATININE 6.68* 5.81*  CALCIUM 7.3* 7.6*    Called by Dr. Alphonsa Overall from [place] [facility] yesterday afternoon who updated me on the events over the past week or so. Patient has been transferred to the rehabilitation center following lumbar surgery 2 weeks ago but apparently developed abdominal problems and was found to have ischemic colitis and underwent a right hemicolectomy about 5 days ago by Dr. Nedra Hai. Patient had initially been managed in the 2300 surgical ICU but apparently was transferred yesterday to the nephrology service and the 2000 unit. However the patient subsequently developed fever to 105 and was transferred to the 3300 units and seen in consultation by Dr. Nelda Marseille from [facility].  Today the patient appears awake alert fully oriented without lethargy and in good spirits. She denies complaints regarding her low back.  On exam we removed the dry dressing from her back the incision is healing nicely there is no swelling erythema or drainage. The Dermabond is gone. I've asked the nurse to replace the dry dressing and we'll have the nursing staff change it daily.   Plan: Dry dressing change daily. Physical therapy and occupational therapy if okayed by general surgery nephrology and CCM. Her lumbar brace is to be donned and doffed out of bed and standing  up. Once patient stabilizes medically and from a general surgical perspective I feel that she can be transferred back to the rehabilitation center.

## 2011-02-14 NOTE — Progress Notes (Addendum)
Patient name: [name] Medical record number: [id number] Date of birth: 06-16-1950 Age: 60 y.o. Gender: female PCP: No primary provider on file.  Date: 02/14/2011 Reason for Consult:  tachycardia, fever Referring Physician:   Moshe Cipro (Renal)  Brief history 60 yo female with hx ESRD, anemia, mult back surgeries admitted 02/09/2011 with ischemic bowel and underwent exp lap with R hemicolectomy and ileostomy 11/24.  Recovering on floor 11/28 when she developed tachycardia, fever (105) and worsening abd pain and PCCM consulted.   Lines/tubes R IJ CVL 11/24>>>  Culture data/sepsis markers BCx 2 11/21>>>neg BCx2 11/26>>> Urine 11/28>>> Lactate 11/28>>>1.1 Pct 11/28>>>68.49  Antibiotics Vanc 11/26>>> Zosyn 11/26>>> Diflucan 11/28>>>   Events/studies 11/29>>HGB dropped to 6.5. Hematochezia. Transfuse 1 unit PRBCs prior to HD per renal 11/29 CT ABD/Pelvis>> CT-scan of abdomen and pelvis   IMPRESSION:  1. Multiple collections of fluid noted along the right side of the abdomen. These are not well defined without contrast. Some of the collections with increased attenuation likely reflect hematomas; evaluation is obscured by diffuse edema involving much of the retroperitoneal and intraperitoneal fat.  The largest collections of fluid are seen at the right iliac fossa, tracking superiorly along the right lateral abdominal wall and at the right renal fossa; given clinical concern, an evolving abscess cannot be excluded. 2. Associated scattered foci of free air noted about the fluid collections; trace residual free air about the hepatic dome is likely postoperative in nature. 3. Significant edema noted at the ileostomy, with a small amount of surrounding fluid; the ileocolic anastomosis is grossly unremarkable in appearance, though there is significant persistent distension of the redundant sigmoid colon, again diffusely filled with fluid and air. The patient's rectal  tube is noted ending at the mid sigmoid colon; the sigmoid colon remains filled with fluid. 4. Diffuse edema within the visualized omentum and mesentery, with associated areas of free fluid; small amount of free fluid within the pelvis demonstrates mildly increased attenuation. This may simply be postoperative in nature. 5. Small bilateral pleural effusions, left greater than right, with associated partial consolidation of the left lower lobe. This may reflect atelectasis or possibly pneumonia. 6. Persistent distension of the pancreatic duct to 0.6 cm, without definite evidence of distal obstruction; the gallbladder remains distended.  7. Anterior midline incision effect, with overlying skin staples; no evidence of wound dehiscence at this time. 8. Diffuse calcification along the abdominal aorta and its branches. 9. Mild diverticulosis along the distal descending and proximal sigmoid colon. 10. Mild cardiomegaly; scattered coronary artery calcifications seen. 11. Marked left renal atrophy, with prominent cysts; status post right-sided nephrectomy.      Vitals: Filed Vitals:   02/13/11 1956 02/13/11 2308 02/14/11 0326 02/14/11 0800  BP:  97/58 107/66 102/67  Pulse:  106 110 106  Temp:  98 F (36.7 C) 98.1 F (36.7 C) 98.1 F (36.7 C)  TempSrc:  Oral Oral Oral  Resp: 12 12 24 22   Height:      Weight:   137 lb 9.1 oz (62.4 kg)   SpO2: 95% 92% 95% 94%       Intake/Output Summary (Last 24 hours) at 02/14/11 0900 Last data filed at 02/14/11 0600  Gross per 24 hour  Intake 945.83 ml  Output   1375 ml  Net -429.17 ml   States pain is controlled on PCA. BM this AM relieved some ABD discomfort. Feels better than 11/28 Physical exam General: chronically ill appearing female, NAD Neuro: Awake and alert, appropriate CV: s1s2  rrr, + murmur PULM:  resps even non labored on [place], diminished bases, few scattered ronchi GI: abd soft, midline incision with staples intact, tender  RLQ, BS active x 4 quadrants Extremities: warm and dry, no edema   Radiology  none  LAB RESULT Lab Results  Component Value Date   CREATININE 6.68* 02/14/2011   BUN 43* 02/14/2011   NA 140 02/14/2011   K 4.4 02/14/2011   CL 100 02/14/2011   CO2 26 02/14/2011   Lab Results  Component Value Date   WBC 18.1* 02/14/2011   HGB 6.5* 02/14/2011   HCT 19.4* 02/14/2011   MCV 86.6 02/14/2011   PLT 571* 02/14/2011   Lab Results  Component Value Date   ALT 10 02/14/2011   No results found for this basename: INR,  PROTIME     Assessment and Plan  1. Tachycardia - likely multifactorial r/t pain and fever +/- volume depletion with high ileostomy outpt. Also with blood bowel movements seen today. Adequate pain control on PCA.  PLAN -  Gentle volume-due for HD 11/29 Pain control    2. Fever - unknown etiology - s/p hemicolectomy for ischemic bowel. Afebrile 11/29. Surgery following.  See 11/29 ABD/pelvis CT results-per surgery. PCT 68.49. Lactate 1.1. Cx pending. Follow CXR for developing pneumonia/infiltrates. On broad spectrum ABX. Recent Labs  Basename 02/14/11 0420 02/13/11 0500 02/12/11 0246   WBC 18.1* 18.6* 17.7*    PLAN -  Cont broad spectrum abx and antifungal Tylenol  3. ESRD - per renal.  Plan HD 11/29 PLAN -  Per renal  F/u chem  4. Ischemic bowel - s/p hemicolectomy. ?new free air, hematoma, fluid in CT ABD/pelvis PLAN -  Per surgery: will F/U CT results See fever  5. Anemia- Acute blood loss 2/2 #4. Large hematochezia (per rectum) 11/29.  PLAN- Transfuse per renal -serial hct -stop lovenox  6. S/P lumbar surgery- seen by Dr. Sherwood Gambler yesterday, recommends dressing changes and PT, needs lumbar brace with PT  7. Heartburn/dyspepsia Plan: -start PPI  Feeding: full liquid: make npo until we hear from surgery Analgesia: prn morphine Sedation: n/a Thromboprophylaxis: scd HOB >30 degrees Ulcer prophylaxis: start ppi Glucose control: monitor  cbg   Barbaraann Cao, NP student 02/14/2011, 9:00 AM   I have seen and examined the patient with Ms. Whiteheart and agree with the note above. Roselie Awkward MD [phone]

## 2011-02-14 NOTE — Progress Notes (Signed)
Patient ID: [name], female   DOB: December 04, 1950, 60 y.o.   MRN: [id number] Patient has had 2 episodes of blood PR about 100cc each.  I D/W Dr. Lucia Gaskins and this is likely due to some new ischemia versus bleeding from the staple line on her hartman.  Patient has been tachycardic since HD so I will transfuse one unit PRBC now.  Will check F/U labs and coags.  I spoke to patient's family at the bedside. Sacha Topor E

## 2011-02-14 NOTE — Progress Notes (Signed)
ANTIBIOTIC CONSULT NOTE - FOLLOW UP  Pharmacy Consult for vanc #8/ zosyn # 5/diflucan #2 Indication: ishemic colitis s/p R hemicolectomy w/ ileostomy   Patient Measurements: Height: 5\' 5"  (165.1 cm) Weight: 137 lb 9.1 oz (62.4 kg) IBW/kg (Calculated) : 57      Labs:  Basename 02/14/11 0420 02/13/11 0500 02/12/11 0246  WBC 18.1* 18.6* 17.7*  HGB 6.5* 8.0* 8.1*  PLT 571* 579* 458*  LABCREA -- -- --  CREATININE 6.68* 5.81* 3.67*     Assessment: vanc # 8, zosyn # 5, diflucan # 2.  T max 98.1. Wbc 18.1. ESRD on TTS HD. No + culture data.  CT of adb/pelvis multiple collections of fluid - most likely hematomas but cannot rule out abscess.  Plan:  Continue vancomycin 500mg  after HD on TTS, continue  zosyn 2.25 q8h.  Continue diflucan 200mg  IV q48hr give dose after HD on HD days. Leodis Sias T 02/14/2011,11:14 AM

## 2011-02-14 NOTE — Progress Notes (Signed)
Subjective:  Patient has been moved to SDU.  Looks better, sitting up drinking breakfast.  Had big bloody bowel movement and hgb low in the 6s, is currently getting one unit of blood and will give more in HD.  CT done (no contrast due to allergy) had fluid and abscess cannot be excluded.  However, fever is downa nd WBC is stable Objective Vital signs in last 24 hours: Filed Vitals:   02/14/11 0800 02/14/11 0900 02/14/11 0907 02/14/11 0922  BP: 102/67 106/64  102/64  Pulse: 106 107 105 102  Temp: 98.1 F (36.7 C) 98 F (36.7 C)  97.9 F (36.6 C)  TempSrc: Oral Oral  Oral  Resp: 22 22 19 14   Height:      Weight:      SpO2: 94%      Weight change: -0.6 kg (-1 lb 5.2 oz)  Intake/Output Summary (Last 24 hours) at 02/14/11 0931 Last data filed at 02/14/11 0600  Gross per 24 hour  Intake 945.83 ml  Output   1375 ml  Net -429.17 ml   Labs: Basic Metabolic Panel:  Lab [id number] 0420 02/13/11 0500 02/12/11 0246 02/11/11 0455  NA 140 143 139 --  K 4.4 4.6 4.1 --  CL 100 103 100 --  CO2 26 26 28  --  GLUCOSE 100* 111* 102* --  BUN 43* 36* 23 --  CREATININE 6.68* 5.81* 3.67* --  CALCIUM 7.3* 7.6* 7.3* --  ALB -- -- -- --  PHOS -- 3.9 3.2 5.1*   Liver Function Tests:  Lab 02/14/11 0420 02/13/11 0500 02/12/11 0246  AST 22 -- --  ALT 10 -- --  ALKPHOS 146* -- --  BILITOT 0.4 -- --  PROT 6.0 -- --  ALBUMIN 1.5* 1.6* 1.6*   No results found for this basename: LIPASE:3,AMYLASE:3 in the last 168 hours No results found for this basename: AMMONIA:3 in the last 168 hours CBC:  Lab 02/14/11 0420 02/13/11 0500 02/12/11 0246 02/11/11 0455 02/10/11 0315 02/09/11 0700  WBC 18.1* 18.6* 17.7* -- -- --  NEUTROABS 13.9* -- -- -- -- PENDING18.7*  HGB 6.5* 8.0* 8.1* -- -- --  HCT 19.4* 23.9* 23.3* -- -- --  MCV 86.6 86.3 82.3 82.1 81.3 --  PLT 571* 579* 458* -- -- --   Cardiac Enzymes: No results found for this basename: CKTOTAL:5,CKMB:5,CKMBINDEX:5,TROPONINI:5 in the last 168  hours CBG:  Lab 02/14/11 0738 02/13/11 2159 02/13/11 1739 02/13/11 1203 02/13/11 0735  GLUCAP 84 163* 124* 100* 108*    Iron Studies:  Basename 02/11/11 1425  IRON 21*  TIBC 106*  TRANSFERRIN --  FERRITIN 2404*   Studies/Results: Ct Abdomen Pelvis Wo Contrast  02/14/2011  *RADIOLOGY REPORT*  Clinical Data: Abdominal pain; status post right-sided hemicolectomy.  Assess for abscess.  CT ABDOMEN AND PELVIS WITHOUT CONTRAST  Technique:  Multidetector CT imaging of the abdomen and pelvis was performed following the standard protocol without intravenous contrast.  Comparison: CT of the abdomen and pelvis performed 02/08/2011  Findings: Small bilateral pleural effusions are noted, left greater than right, with partial consolidation of the left lower lobe. This may reflect atelectasis or pneumonia.  The heart is mildly enlarged.  Scattered coronary artery calcifications are seen.  Trace residual free air is noted scattered about the hepatic dome, likely postoperative in nature.  Along the site of right-sided hemicolectomy, there are multiple collections of fluid, demonstrating significant heterogeneity and scattered associated foci of air.  Given the lack of contrast, evaluation for abscess is  significantly suboptimal.  Some of the collections with increased attenuation likely reflect hematomas, while there is more diffuse edema involving much of the retroperitoneal and intraperitoneal fat.  An apparent small air- fluid level is noted along the right lateral abdominal wall at the right lower quadrant, with fluid tracking adjacent to the right psoas muscle.  Given clinical concern, evolving abscess cannot be excluded.  The largest collections of fluid are seen at the right iliac fossa, tracking superiorly along the right lateral abdominal wall, and at the right renal fossa.  The liver is unremarkable in appearance.  Scattered calcifications within the spleen appear vascular in nature.  There is persistent  distension of the gallbladder. Prominence of the pancreatic duct to 0.6 cm appears stable from the prior study.  There is no definite evidence of distal obstruction; the pancreas is otherwise grossly unremarkable in appearance.  The adrenal glands are within normal limits.  The patient is status post right-sided nephrectomy, with associated postoperative change.  Numerous cysts are seen arising from the left kidney, with marked atrophy of the left kidney.  There is no evidence of hydronephrosis.  There is diffuse edema within the visualized omentum and mesentery, with associated areas of free fluid.  An apparent ileocolic anastomosis is noted at the right mid abdomen, with an overlying ileostomy.  There is significant soft tissue edema with respect to the ileostomy, and a small amount of surrounding fluid.  The remaining small bowel is grossly unremarkable in appearance. There is significant distension of the redundant transverse colon, again filled with fluid and air.  Mild diverticulosis is noted along the distal descending and proximal sigmoid colon; a rectal tube is noted ending at the mid sigmoid colon.  No acute vascular abnormalities are seen.  There is diffuse calcification along the abdominal aorta and its branches.  An anterior midline incision defect is noted, with overlying skin staples.  There is no evidence of wound dehiscence at this time.  The bladder is decompressed and not well assessed.  The uterus is grossly unremarkable, though difficult to assess due to surrounding fluid.  The ovaries are not well characterized; no suspicious adnexal masses are seen.  A small amount of free fluid is noted within the pelvis, demonstrating somewhat increased attenuation; this may simply be postoperative in nature.  No acute osseous abnormalities are seen.  The patient is status post lumbar spinal fusion at L4-S1, with associated decompression.  IMPRESSION:  1.  Multiple collections of fluid noted along the right  side of the abdomen.  These are not well defined without contrast.  Some of the collections with increased attenuation likely reflect hematomas; evaluation is obscured by diffuse edema involving much of the retroperitoneal and intraperitoneal fat.  The largest collections of fluid are seen at the right iliac fossa, tracking superiorly along the right lateral abdominal wall and at the right renal fossa; given clinical concern, an evolving abscess cannot be excluded. 2.  Associated scattered foci of free air noted about the fluid collections; trace residual free air about the hepatic dome is likely postoperative in nature. 3.  Significant edema noted at the ileostomy, with a small amount of surrounding fluid; the ileocolic anastomosis is grossly unremarkable in appearance, though there is significant persistent distension of the redundant sigmoid colon, again diffusely filled with fluid and air.  The patient's rectal tube is noted ending at the mid sigmoid colon; the sigmoid colon remains filled with fluid. 4.  Diffuse edema within the visualized omentum and mesentery,  with associated areas of free fluid; small amount of free fluid within the pelvis demonstrates mildly increased attenuation.  This may simply be postoperative in nature. 5.  Small bilateral pleural effusions, left greater than right, with associated partial consolidation of the left lower lobe.  This may reflect atelectasis or possibly pneumonia. 6.  Persistent distension of the pancreatic duct to 0.6 cm, without definite evidence of distal obstruction; the gallbladder remains distended.  7.  Anterior midline incision effect, with overlying skin staples; no evidence of wound dehiscence at this time. 8.  Diffuse calcification along the abdominal aorta and its branches. 9.  Mild diverticulosis along the distal descending and proximal sigmoid colon. 10.  Mild cardiomegaly; scattered coronary artery calcifications seen. 11.  Marked left renal atrophy, with  prominent cysts; status post right-sided nephrectomy.  Findings were discussed with Judson Roch RN on [id number] at 03:02 a.m. on 02/14/2011.  Original Report Authenticated By: Santa Lighter, M.D.   Dg Chest Port 1 View  02/13/2011  *RADIOLOGY REPORT*  Clinical Data: Fever.  Pneumonia.  PORTABLE CHEST - 1 VIEW  Comparison: Chest x-ray 02/09/2011.  Findings: The right IJ catheter is stable.  The NG tube has been removed.  The heart is mildly enlarged but stable.  There is tortuosity and calcification of the thoracic aorta.  Bibasilar lung opacity could reflect atelectasis or infiltrates.  No effusions or edema.  IMPRESSION: Bibasilar airspace opacity, likely atelectasis but cannot exclude developing infiltrates.  Original Report Authenticated By: P. Kalman Jewels, M.D.   Medications: Infusions:    . sodium chloride 50 mL/hr at 02/13/11 1800  . DISCONTD: dextrose 5 % and 0.9% NaCl 40 mL/hr at 02/13/11 0310    Scheduled Medications:    . sodium chloride   Intravenous Once  . calcium carbonate  400 mg of elemental calcium Oral TID WC  . darbepoetin (ARANESP) injection - DIALYSIS  200 mcg Intravenous Q Tue-HD  . enoxaparin  30 mg Subcutaneous Q24H  . feeding supplement (NEPRO CARB STEADY)  237 mL Oral TID WC  . fluconazole (DIFLUCAN) IV  200 mg Intravenous Q48H  . morphine   Intravenous Q4H  . piperacillin-tazobactam (ZOSYN)  IV  2.25 g Intravenous Q8H  . vancomycin  1,000 mg Intravenous Once    have reviewed scheduled and prn medications.  Physical Exam: General: alert , talkative Heart: HR down to low 100s Lungs: mostly clr Abdomen: distended and somewhat tender but better than yesterday Extremities:some dependant edema Dialysis Access: L AVG good thrill and bruit   I Assessment/ Plan: Pt is a 60 y.o. yo female who was admitted on 02/09/2011 with  S/p hemicolectomy due to ischemic bowel.  Assessment/Plan: 1. Hemicolectomy due to ischemic bowel - POD 5.  Is having continued pain req PCA  and also abnormal CT with persistently elevated WBC and fever.  Per surgery.  Since somewhat clinically improved today dont think anything needed emergently.  Also with bleeding, will transfuse today and follow 2. ESRD - continue q TTS via AVG, normally runs at [facility] 3. Anemia- ABL today, needs transfusion.  Continue aranesp 4. Secondary hyperparathyroidism- no binders, phos is good 5. HTN/volume- though to maybe be a little dry yesterday, having high ostomy output, currently on IVF will continue for now and run even today.  No o2 6. ID- vanc/zosy.  Added diflucan yesterday   [name] A   02/14/2011,9:31 AM  LOS: 5 days

## 2011-02-14 NOTE — Progress Notes (Signed)
Pt returned from HD;  BP 115/76, HR 130s-150s; O2 sats 90s on RA; pain level 2/10.  Dr Mercy Moore notified of above.  No new orders,  Will continue to monitor.   Vista Lawman, RN

## 2011-02-15 ENCOUNTER — Inpatient Hospital Stay (HOSPITAL_COMMUNITY): Payer: Medicare Other

## 2011-02-15 LAB — BASIC METABOLIC PANEL
BUN: 19 mg/dL (ref 6–23)
Chloride: 100 mEq/L (ref 96–112)
Chloride: 100 mEq/L (ref 96–112)
GFR calc Af Amer: 16 mL/min — ABNORMAL LOW (ref >90)
GFR calc Af Amer: 14 mL/min — ABNORMAL LOW (ref >90)
Glucose, Bld: 71 mg/dL (ref 70–99)
Potassium: 3.7 mEq/L (ref 3.5–5.1)
Potassium: 3.4 mEq/L — ABNORMAL LOW (ref 3.5–5.1)
Sodium: 141 mEq/L (ref 135–145)

## 2011-02-15 LAB — HEMOGLOBIN AND HEMATOCRIT, BLOOD
HCT: 26.1 % — ABNORMAL LOW (ref 36.0–46.0)
Hemoglobin: 8.9 g/dL — ABNORMAL LOW (ref 12.0–15.0)

## 2011-02-15 LAB — CBC
HCT: 25.4 % — ABNORMAL LOW (ref 36.0–46.0)
Hemoglobin: 8.6 g/dL — ABNORMAL LOW (ref 12.0–15.0)
MCH: 28.6 pg (ref 26.0–34.0)
MCHC: 33.9 g/dL (ref 30.0–36.0)
Platelets: 420 10*3/uL — ABNORMAL HIGH (ref 150–400)
RBC: 3.01 MIL/uL — ABNORMAL LOW (ref 3.87–5.11)
RDW: 15.3 % (ref 11.5–15.5)
WBC: 14 10*3/uL — ABNORMAL HIGH (ref 4.0–10.5)

## 2011-02-15 LAB — GLUCOSE, CAPILLARY: Glucose-Capillary: 160 mg/dL — ABNORMAL HIGH (ref 70–99)

## 2011-02-15 LAB — PROTIME-INR: Prothrombin Time: 18.8 seconds — ABNORMAL HIGH (ref 11.6–15.2)

## 2011-02-15 MED ORDER — MORPHINE SULFATE (PF) 1 MG/ML IV SOLN
INTRAVENOUS | Status: AC
  Filled 2011-02-15: qty 25

## 2011-02-15 MED ORDER — ALUM & MAG HYDROXIDE-SIMETH 400-400-40 MG/5ML PO SUSP
15.0000 mL | Freq: Four times a day (QID) | ORAL | Status: DC | PRN
  Administered 2011-02-15: 15 mL via ORAL
  Filled 2011-02-15: qty 30

## 2011-02-15 NOTE — Progress Notes (Signed)
Subjective:  Patient resting in bed comfortably. She's been advanced to a renal diet by the nephrology service. Physical therapy is about to begin to work with the patient. Lumbar dressing is reported by the nurse to be clean and dry. Patient did have a fever to 101.8 last night at about 1900.  Objective: Vital signs in last 24 hours: Filed Vitals:   02/15/11 0115 02/15/11 0341 02/15/11 0400 02/15/11 0836  BP: 113/74  103/58 104/76  Pulse:    110  Temp: 98.3 F (36.8 C)  98 F (36.7 C) 98.5 F (36.9 C)  TempSrc: Oral  Oral Oral  Resp: 20   21  Height:      Weight:  61 kg (134 lb 7.7 oz)    SpO2:    98%    Intake/Output from previous day: 11/29 0701 - 11/30 0700 In: 2363.3 [I.V.:1650; Blood:663.3; IV Piggyback:50] Out: 1550 [Stool:300] Intake/Output this shift:    Physical exam: Patient is awake alert oriented following commands she does have bilateral lower extremity weakness of the dorsiflexors. Dressing is clean and dry.  CBC  Basename 02/15/11 0600 02/15/11 0145 02/14/11 0420  WBC 14.0* -- 18.1*  HGB 8.6* 8.9* --  HCT 25.2* 26.1* --  PLT 420* -- 571*   BMET  Basename 02/15/11 0600 02/14/11 0420  NA 141 140  K 3.7 4.4  CL 100 100  CO2 26 26  GLUCOSE 80 100*  BUN 17 43*  CREATININE 3.44* 6.68*  CALCIUM 7.1* 7.3*    Studies/Results: Ct Abdomen Pelvis Wo Contrast  02/14/2011  *RADIOLOGY REPORT*  Clinical Data: Abdominal pain; status post right-sided hemicolectomy.  Assess for abscess.  CT ABDOMEN AND PELVIS WITHOUT CONTRAST  Technique:  Multidetector CT imaging of the abdomen and pelvis was performed following the standard protocol without intravenous contrast.  Comparison: CT of the abdomen and pelvis performed 02/08/2011  Findings: Small bilateral pleural effusions are noted, left greater than right, with partial consolidation of the left lower lobe. This may reflect atelectasis or pneumonia.  The heart is mildly enlarged.  Scattered coronary artery  calcifications are seen.  Trace residual free air is noted scattered about the hepatic dome, likely postoperative in nature.  Along the site of right-sided hemicolectomy, there are multiple collections of fluid, demonstrating significant heterogeneity and scattered associated foci of air.  Given the lack of contrast, evaluation for abscess is significantly suboptimal.  Some of the collections with increased attenuation likely reflect hematomas, while there is more diffuse edema involving much of the retroperitoneal and intraperitoneal fat.  An apparent small air- fluid level is noted along the right lateral abdominal wall at the right lower quadrant, with fluid tracking adjacent to the right psoas muscle.  Given clinical concern, evolving abscess cannot be excluded.  The largest collections of fluid are seen at the right iliac fossa, tracking superiorly along the right lateral abdominal wall, and at the right renal fossa.  The liver is unremarkable in appearance.  Scattered calcifications within the spleen appear vascular in nature.  There is persistent distension of the gallbladder. Prominence of the pancreatic duct to 0.6 cm appears stable from the prior study.  There is no definite evidence of distal obstruction; the pancreas is otherwise grossly unremarkable in appearance.  The adrenal glands are within normal limits.  The patient is status post right-sided nephrectomy, with associated postoperative change.  Numerous cysts are seen arising from the left kidney, with marked atrophy of the left kidney.  There is no evidence of  hydronephrosis.  There is diffuse edema within the visualized omentum and mesentery, with associated areas of free fluid.  An apparent ileocolic anastomosis is noted at the right mid abdomen, with an overlying ileostomy.  There is significant soft tissue edema with respect to the ileostomy, and a small amount of surrounding fluid.  The remaining small bowel is grossly unremarkable in  appearance. There is significant distension of the redundant transverse colon, again filled with fluid and air.  Mild diverticulosis is noted along the distal descending and proximal sigmoid colon; a rectal tube is noted ending at the mid sigmoid colon.  No acute vascular abnormalities are seen.  There is diffuse calcification along the abdominal aorta and its branches.  An anterior midline incision defect is noted, with overlying skin staples.  There is no evidence of wound dehiscence at this time.  The bladder is decompressed and not well assessed.  The uterus is grossly unremarkable, though difficult to assess due to surrounding fluid.  The ovaries are not well characterized; no suspicious adnexal masses are seen.  A small amount of free fluid is noted within the pelvis, demonstrating somewhat increased attenuation; this may simply be postoperative in nature.  No acute osseous abnormalities are seen.  The patient is status post lumbar spinal fusion at L4-S1, with associated decompression.  IMPRESSION:  1.  Multiple collections of fluid noted along the right side of the abdomen.  These are not well defined without contrast.  Some of the collections with increased attenuation likely reflect hematomas; evaluation is obscured by diffuse edema involving much of the retroperitoneal and intraperitoneal fat.  The largest collections of fluid are seen at the right iliac fossa, tracking superiorly along the right lateral abdominal wall and at the right renal fossa; given clinical concern, an evolving abscess cannot be excluded. 2.  Associated scattered foci of free air noted about the fluid collections; trace residual free air about the hepatic dome is likely postoperative in nature. 3.  Significant edema noted at the ileostomy, with a small amount of surrounding fluid; the ileocolic anastomosis is grossly unremarkable in appearance, though there is significant persistent distension of the redundant sigmoid colon, again  diffusely filled with fluid and air.  The patient's rectal tube is noted ending at the mid sigmoid colon; the sigmoid colon remains filled with fluid. 4.  Diffuse edema within the visualized omentum and mesentery, with associated areas of free fluid; small amount of free fluid within the pelvis demonstrates mildly increased attenuation.  This may simply be postoperative in nature. 5.  Small bilateral pleural effusions, left greater than right, with associated partial consolidation of the left lower lobe.  This may reflect atelectasis or possibly pneumonia. 6.  Persistent distension of the pancreatic duct to 0.6 cm, without definite evidence of distal obstruction; the gallbladder remains distended.  7.  Anterior midline incision effect, with overlying skin staples; no evidence of wound dehiscence at this time. 8.  Diffuse calcification along the abdominal aorta and its branches. 9.  Mild diverticulosis along the distal descending and proximal sigmoid colon. 10.  Mild cardiomegaly; scattered coronary artery calcifications seen. 11.  Marked left renal atrophy, with prominent cysts; status post right-sided nephrectomy.  Findings were discussed with Judson Roch RN on [id number] at 03:02 a.m. on 02/14/2011.  Original Report Authenticated By: Santa Lighter, M.D.   Dg Chest Port 1 View  02/15/2011  *RADIOLOGY REPORT*  Clinical Data: Follow up infiltrate.  PORTABLE CHEST - 1 VIEW  Comparison: 02/13/2011.  Findings: Right central  line tip proximal to mid superior vena cava level.  No gross pneumothorax.  Surgical clips project over the right hilum.  Bibasilar infiltrates/atelectasis more notable on the left with minimal change.  Cardiomegaly.  Calcified tortuous aorta.  Central pulmonary vascular prominence.  IMPRESSION: No significant change basilar atelectasis/infiltrates more notable on the left.  Cardiomegaly.  Calcified tortuous aorta.  Original Report Authenticated By: Doug Sou, M.D.   Dg Chest Port 1  View  02/13/2011  *RADIOLOGY REPORT*  Clinical Data: Fever.  Pneumonia.  PORTABLE CHEST - 1 VIEW  Comparison: Chest x-ray 02/09/2011.  Findings: The right IJ catheter is stable.  The NG tube has been removed.  The heart is mildly enlarged but stable.  There is tortuosity and calcification of the thoracic aorta.  Bibasilar lung opacity could reflect atelectasis or infiltrates.  No effusions or edema.  IMPRESSION: Bibasilar airspace opacity, likely atelectasis but cannot exclude developing infiltrates.  Original Report Authenticated By: P. Kalman Jewels, M.D.    Assessment/Plan: Patient is stable from a neurosurgical perspective physical therapy and occupational therapy have been restarted. Once she is stabilized from a general surgical and medical perspective I feel that she can be transferred back to the [facility] rehabilitation center.  She did have some fever overnight a is now afebrile. Lumbar wound shows no signs of infection. White blood cell count has improved.   Hosie Spangle, MD 02/15/2011, 11:16 AM

## 2011-02-15 NOTE — Progress Notes (Signed)
Subjective:  Patient continues to look well, pain seems decreased.   Is continuing to have BRBPR, thought maybe due to continued ischemia vs staple line.  Pt denies pain.  Has received 3 units of blood the last 24 hours. Fever is downa nd WBC down as well.  Had HD yesterday, tolerated fine   Objective Vital signs in last 24 hours: Filed Vitals:   02/15/11 0115 02/15/11 0341 02/15/11 0400 02/15/11 0836  BP: 113/74  103/58 104/76  Pulse:    110  Temp: 98.3 F (36.8 C)  98 F (36.7 C) 98.5 F (36.9 C)  TempSrc: Oral  Oral Oral  Resp: 20   21  Height:      Weight:  61 kg (134 lb 7.7 oz)    SpO2:    98%   Weight change: -0.9 kg (-1 lb 15.7 oz)  Intake/Output Summary (Last 24 hours) at 02/15/11 0917 Last data filed at 02/15/11 0400  Gross per 24 hour  Intake 2263.33 ml  Output   1550 ml  Net 713.33 ml   Labs: Basic Metabolic Panel:  Lab [id number] 0600 02/14/11 0420 02/13/11 0500 02/12/11 0246 02/11/11 0455  NA 141 140 143 -- --  K 3.7 4.4 4.6 -- --  CL 100 100 103 -- --  CO2 26 26 26  -- --  GLUCOSE 80 100* 111* -- --  BUN 17 43* 36* -- --  CREATININE 3.44* 6.68* 5.81* -- --  CALCIUM 7.1* 7.3* 7.6* -- --  ALB -- -- -- -- --  PHOS -- -- 3.9 3.2 5.1*   Liver Function Tests:  Lab 02/14/11 0420 02/13/11 0500 02/12/11 0246  AST 22 -- --  ALT 10 -- --  ALKPHOS 146* -- --  BILITOT 0.4 -- --  PROT 6.0 -- --  ALBUMIN 1.5* 1.6* 1.6*   No results found for this basename: LIPASE:3,AMYLASE:3 in the last 168 hours No results found for this basename: AMMONIA:3 in the last 168 hours CBC:  Lab 02/15/11 0600 02/15/11 0145 02/14/11 1800 02/14/11 0420 02/13/11 0500 02/12/11 0246 02/11/11 0455 02/09/11 0700  WBC 14.0* -- -- 18.1* 18.6* -- -- --  NEUTROABS -- -- -- 13.9* -- -- -- PENDING18.7*  HGB 8.6* 8.9* 9.4* -- -- -- -- --  HCT 25.2* 26.1* 26.8* -- -- -- -- --  MCV 84.0 -- -- 86.6 86.3 82.3 82.1 --  PLT 420* -- -- 571* 579* -- -- --   Cardiac Enzymes: No results found for  this basename: CKTOTAL:5,CKMB:5,CKMBINDEX:5,TROPONINI:5 in the last 168 hours CBG:  Lab 02/15/11 0731 02/14/11 2149 02/14/11 1158 02/14/11 0738 02/13/11 2159  GLUCAP 72 89 103* 84 163*    Iron Studies: No results found for this basename: IRON,TIBC,TRANSFERRIN,FERRITIN in the last 72 hours Studies/Results: Ct Abdomen Pelvis Wo Contrast  02/14/2011  *RADIOLOGY REPORT*  Clinical Data: Abdominal pain; status post right-sided hemicolectomy.  Assess for abscess.  CT ABDOMEN AND PELVIS WITHOUT CONTRAST  Technique:  Multidetector CT imaging of the abdomen and pelvis was performed following the standard protocol without intravenous contrast.  Comparison: CT of the abdomen and pelvis performed 02/08/2011  Findings: Small bilateral pleural effusions are noted, left greater than right, with partial consolidation of the left lower lobe. This may reflect atelectasis or pneumonia.  The heart is mildly enlarged.  Scattered coronary artery calcifications are seen.  Trace residual free air is noted scattered about the hepatic dome, likely postoperative in nature.  Along the site of right-sided hemicolectomy, there are multiple collections of  fluid, demonstrating significant heterogeneity and scattered associated foci of air.  Given the lack of contrast, evaluation for abscess is significantly suboptimal.  Some of the collections with increased attenuation likely reflect hematomas, while there is more diffuse edema involving much of the retroperitoneal and intraperitoneal fat.  An apparent small air- fluid level is noted along the right lateral abdominal wall at the right lower quadrant, with fluid tracking adjacent to the right psoas muscle.  Given clinical concern, evolving abscess cannot be excluded.  The largest collections of fluid are seen at the right iliac fossa, tracking superiorly along the right lateral abdominal wall, and at the right renal fossa.  The liver is unremarkable in appearance.  Scattered  calcifications within the spleen appear vascular in nature.  There is persistent distension of the gallbladder. Prominence of the pancreatic duct to 0.6 cm appears stable from the prior study.  There is no definite evidence of distal obstruction; the pancreas is otherwise grossly unremarkable in appearance.  The adrenal glands are within normal limits.  The patient is status post right-sided nephrectomy, with associated postoperative change.  Numerous cysts are seen arising from the left kidney, with marked atrophy of the left kidney.  There is no evidence of hydronephrosis.  There is diffuse edema within the visualized omentum and mesentery, with associated areas of free fluid.  An apparent ileocolic anastomosis is noted at the right mid abdomen, with an overlying ileostomy.  There is significant soft tissue edema with respect to the ileostomy, and a small amount of surrounding fluid.  The remaining small bowel is grossly unremarkable in appearance. There is significant distension of the redundant transverse colon, again filled with fluid and air.  Mild diverticulosis is noted along the distal descending and proximal sigmoid colon; a rectal tube is noted ending at the mid sigmoid colon.  No acute vascular abnormalities are seen.  There is diffuse calcification along the abdominal aorta and its branches.  An anterior midline incision defect is noted, with overlying skin staples.  There is no evidence of wound dehiscence at this time.  The bladder is decompressed and not well assessed.  The uterus is grossly unremarkable, though difficult to assess due to surrounding fluid.  The ovaries are not well characterized; no suspicious adnexal masses are seen.  A small amount of free fluid is noted within the pelvis, demonstrating somewhat increased attenuation; this may simply be postoperative in nature.  No acute osseous abnormalities are seen.  The patient is status post lumbar spinal fusion at L4-S1, with associated  decompression.  IMPRESSION:  1.  Multiple collections of fluid noted along the right side of the abdomen.  These are not well defined without contrast.  Some of the collections with increased attenuation likely reflect hematomas; evaluation is obscured by diffuse edema involving much of the retroperitoneal and intraperitoneal fat.  The largest collections of fluid are seen at the right iliac fossa, tracking superiorly along the right lateral abdominal wall and at the right renal fossa; given clinical concern, an evolving abscess cannot be excluded. 2.  Associated scattered foci of free air noted about the fluid collections; trace residual free air about the hepatic dome is likely postoperative in nature. 3.  Significant edema noted at the ileostomy, with a small amount of surrounding fluid; the ileocolic anastomosis is grossly unremarkable in appearance, though there is significant persistent distension of the redundant sigmoid colon, again diffusely filled with fluid and air.  The patient's rectal tube is noted ending at the  mid sigmoid colon; the sigmoid colon remains filled with fluid. 4.  Diffuse edema within the visualized omentum and mesentery, with associated areas of free fluid; small amount of free fluid within the pelvis demonstrates mildly increased attenuation.  This may simply be postoperative in nature. 5.  Small bilateral pleural effusions, left greater than right, with associated partial consolidation of the left lower lobe.  This may reflect atelectasis or possibly pneumonia. 6.  Persistent distension of the pancreatic duct to 0.6 cm, without definite evidence of distal obstruction; the gallbladder remains distended.  7.  Anterior midline incision effect, with overlying skin staples; no evidence of wound dehiscence at this time. 8.  Diffuse calcification along the abdominal aorta and its branches. 9.  Mild diverticulosis along the distal descending and proximal sigmoid colon. 10.  Mild cardiomegaly;  scattered coronary artery calcifications seen. 11.  Marked left renal atrophy, with prominent cysts; status post right-sided nephrectomy.  Findings were discussed with Judson Roch RN on [id number] at 03:02 a.m. on 02/14/2011.  Original Report Authenticated By: Santa Lighter, M.D.   Dg Chest Port 1 View  02/15/2011  *RADIOLOGY REPORT*  Clinical Data: Follow up infiltrate.  PORTABLE CHEST - 1 VIEW  Comparison: 02/13/2011.  Findings: Right central line tip proximal to mid superior vena cava level.  No gross pneumothorax.  Surgical clips project over the right hilum.  Bibasilar infiltrates/atelectasis more notable on the left with minimal change.  Cardiomegaly.  Calcified tortuous aorta.  Central pulmonary vascular prominence.  IMPRESSION: No significant change basilar atelectasis/infiltrates more notable on the left.  Cardiomegaly.  Calcified tortuous aorta.  Original Report Authenticated By: Doug Sou, M.D.   Dg Chest Port 1 View  02/13/2011  *RADIOLOGY REPORT*  Clinical Data: Fever.  Pneumonia.  PORTABLE CHEST - 1 VIEW  Comparison: Chest x-ray 02/09/2011.  Findings: The right IJ catheter is stable.  The NG tube has been removed.  The heart is mildly enlarged but stable.  There is tortuosity and calcification of the thoracic aorta.  Bibasilar lung opacity could reflect atelectasis or infiltrates.  No effusions or edema.  IMPRESSION: Bibasilar airspace opacity, likely atelectasis but cannot exclude developing infiltrates.  Original Report Authenticated By: P. Kalman Jewels, M.D.   Medications: Infusions:    . sodium chloride 50 mL/hr at 02/14/11 1200    Scheduled Medications:    . calcium carbonate  400 mg of elemental calcium Oral TID WC  . darbepoetin (ARANESP) injection - DIALYSIS  200 mcg Intravenous Q Tue-HD  . feeding supplement (NEPRO CARB STEADY)  237 mL Oral TID WC  . fluconazole (DIFLUCAN) IV  200 mg Intravenous Q48H  . morphine   Intravenous Q4H  . pantoprazole (PROTONIX) IV  40 mg  Intravenous Q12H  . piperacillin-tazobactam (ZOSYN)  IV  2.25 g Intravenous Q8H  . sodium chloride      . sodium chloride      . vancomycin  1,000 mg Intravenous Once  . DISCONTD: enoxaparin  30 mg Subcutaneous Q24H    have reviewed scheduled and prn medications.  Physical Exam: General: alert , talkative Heart: HR down to low 100s Lungs: mostly clr Abdomen: distended and somewhat tender but better than yesterday Extremities:some dependant edema Dialysis Access: L AVG good thrill and bruit   I Assessment/ Plan: Pt is a 60 y.o. yo female who was admitted on 02/09/2011 with  S/p hemicolectomy due to ischemic bowel.  Assessment/Plan: 1. Hemicolectomy due to ischemic bowel - POD 6.  Is having continued pain req PCA  and also abnormal CT with persistently elevated WBC and fever, but overall improved.  Bleeding from distal bowel as above, will moniter and transfuse as necessary. Surgery to start diet today 2. ESRD - continue q TTS via AVG, next due tomorrow. normally runs at [facility] 3. Anemia- ABL today, needs transfusion.  Continue aranesp 4. Secondary hyperparathyroidism- on TUMS, phos is good 5. HTN/volume-given dependant edema, will stop continuous IVF.  Will need to follow Is and os because will have high ostomy output.  6. ID- vanc/zosyn/diflucan 7. Dispo- mobilize     Chavon Lucarelli A   02/15/2011,9:17 AM  LOS: 6 days

## 2011-02-15 NOTE — Progress Notes (Signed)
02/15/11 0645 Pt had episodes of blood with clots from rectum at 2000, 2200, 2330, 0230 each progressively smaller in amount.  At 0600 pt had larger episode of blood with large clots from rectum (approx. 250cc).  Hgb 8.6 Hct 25.2.  Dr. Grandville Silos notified.  Will address issues on rounds this AM.  No new orders given.   Dimitri Ped RN

## 2011-02-15 NOTE — Progress Notes (Signed)
Occupational Therapy Treatment Patient Details Name: [name] MRN: [id number] DOB: 1950-08-08 Today's Date: 02/15/2011  OT Assessment/Plan OT Assessment/Plan Comments on Treatment Session: Pt. with improved cognition today and appropriate with following commands. Pt. With increased OOB activity with mobility in hallway today and with recall of back precautions and techniques for completing ADLs. Pt. HR increased to 130 with ambulation into hall.  OT Plan: Discharge plan remains appropriate OT Frequency: Min 2X/week Follow Up Recommendations: Inpatient Rehab Equipment Recommended: Defer to next venue OT Goals Acute Rehab OT Goals OT Goal Formulation: With patient Time For Goal Achievement: 2 weeks ADL Goals Pt Will Perform Grooming: with set-up;with supervision;Standing at sink ADL Goal: Grooming - Progress: Progressing toward goals Pt Will Perform Lower Body Bathing: with min assist;Sitting in shower ADL Goal: Lower Body Bathing - Progress: Not addressed Pt Will Perform Lower Body Dressing: with min assist;Sit to stand from bed;with adaptive equipment ADL Goal: Lower Body Dressing - Progress: Progressing toward goals Pt Will Transfer to Toilet: Stand pivot transfer;with supervision ADL Goal: Toilet Transfer - Progress: Progressing toward goals  OT Treatment Precautions/Restrictions  Precautions Precautions: Back Precaution Booklet Issued: Yes (comment) Precaution Comments: Back brace Required Braces or Orthoses: Yes Spinal Brace: Lumbar corset;Applied in standing position Other Brace/Splint: Aspen corset Restrictions Weight Bearing Restrictions: No   ADL ADL Grooming: Performed;Wash/dry hands;Set up;Minimal assistance Where Assessed - Grooming: Standing at sink Upper Body Dressing: Performed;Minimal assistance Upper Body Dressing Details (indicate cue type and reason): With donning gown Where Assessed - Upper Body Dressing: Sitting, bed Lower Body Dressing: +1  Total assistance;Performed Lower Body Dressing Details (indicate cue type and reason): Pt. unable to reach bilateral feet to don socks Where Assessed - Lower Body Dressing: Sit to stand from bed Equipment Used: Rolling walker ADL Comments: Pt. completed ambulation of ~30' with RW and total assist +2 pt=30-60% due to pt. with bilateral LE weakness and both knees buckling intermittently during walking. Pt. educated on techniques for completing ADLs and following back precautions and pt. able to recall 2/3 back precautions. Pt. HR increased to 130 with ambulation and pt. on RA.  Mobility  Bed Mobility Bed Mobility: Yes Rolling Left: 4: Min assist;With rail Rolling Left Details (indicate cue type and reason): max cues for sequencing to maintain back precautions Left Sidelying to Sit: 3: Mod assist;HOB flat;With rails Left Sidelying to Sit Details (indicate cue type and reason): vc to maintain back precautions (especially no twisting) Transfers Transfers: Yes Sit to Stand: 1: +2 Total assist;Patient percentage (comment);From bed;With upper extremity assist Sit to Stand Details (indicate cue type and reason): pt=50%; pt prefers Rt hand on RW and Lt hand pushing off bed; RW stabilized by therapist Stand to Sit: 1: +2 Total assist;Patient percentage (comment);Without upper extremity assist;To chair/3-in-1 Stand to Sit Details: pt=20; Pt very fatigued as she approached the chair; uncontrolled descent with bil hands still on RW     End of Session OT - End of Session Equipment Utilized During Treatment: Gait belt;Back brace Activity Tolerance: Patient limited by pain;Patient limited by fatigue Patient left: in chair;with call bell in reach Nurse Communication: Mobility status for transfers General Behavior During Session: [facility] for tasks performed Cognition: [facility] for tasks performed  Aslee Such, OTR/L Pager [phone]  02/15/2011, 2:11 PM

## 2011-02-15 NOTE — Progress Notes (Signed)
POD# 6  Assessment/Plan:   1. Acute ischemic colitis -Right hemicolectomy, ileostomy 02/09/2011 - D. Ninfa Linden Will advance to reg diet. In spite of lower GI bleed, she looks much better today.   2.  ESRD - dialysis yesterday.   3.  Recent back surgery - 01/30/2011 - [facility] Foot drop. Getting PT. Needs to ambulate as much as possible.  The limits are her LE.   4. Bloody bowel movements (out rectum) - this is defunctionalized bowel.  Primary concerns are further ischemic colon vs bleed from staple line. The fecal stream is diverted, so she can be feed. She has received 3 units of blood over last 24 hours.  5.  Chronic anemia.  6.  DM - stable  7.  Infection - controlled, on zosyn.    LOS: 6 days   Subjective:  More comfortable.  Still mild lower abdominal and low back pain.  Objective:   Filed Vitals:   02/15/11 0836  BP: 104/76  Pulse: 110  Temp: 98.5 F (36.9 C)  Resp: 21     Intake/Output from previous day:  11/29 0701 - 11/30 0700 In: 2363.3 [I.V.:1650; Blood:663.3; IV Piggyback:50] Out: 1550 [Stool:300]  Intake/Output this shift:      Physical Exam:   General: WN AA female who is alert.    HEENT: Normal. Pupils equal. Good dentition. .   Lungs: clear   Abdomen: bowel sounds present.  Ostomy with output liquid.   Wound: abdomen and back okay.   Neurologic:  Foot drop with overall weakness.   Psychiatric: Has normal mood and affect.   Lab Results:     Basename 02/15/11 0600 02/15/11 0145 02/14/11 0420  WBC 14.0* -- 18.1*  HGB 8.6* 8.9* --  HCT 25.2* 26.1* --  PLT 420* -- 571*    BMET    Basename 02/15/11 0600 02/14/11 0420  NA 141 140  K 3.7 4.4  CL 100 100  CO2 26 26  GLUCOSE 80 100*  BUN 17 43*  CREATININE 3.44* 6.68*  CALCIUM 7.1* 7.3*    PT/INR    Basename 02/15/11 0145  LABPROT 18.8*  INR 1.54*    ABG  No results found for this basename: PHART:2,PCO2:2,PO2:2,HCO3:2 in the last 72 hours   Studies/Results:  Ct Abdomen Pelvis Wo  Contrast  02/14/2011  *RADIOLOGY REPORT*  Clinical Data: Abdominal pain; status post right-sided hemicolectomy.  Assess for abscess.  CT ABDOMEN AND PELVIS WITHOUT CONTRAST  Technique:  Multidetector CT imaging of the abdomen and pelvis was performed following the standard protocol without intravenous contrast.  Comparison: CT of the abdomen and pelvis performed 02/08/2011  Findings: Small bilateral pleural effusions are noted, left greater than right, with partial consolidation of the left lower lobe. This may reflect atelectasis or pneumonia.  The heart is mildly enlarged.  Scattered coronary artery calcifications are seen.  Trace residual free air is noted scattered about the hepatic dome, likely postoperative in nature.  Along the site of right-sided hemicolectomy, there are multiple collections of fluid, demonstrating significant heterogeneity and scattered associated foci of air.  Given the lack of contrast, evaluation for abscess is significantly suboptimal.  Some of the collections with increased attenuation likely reflect hematomas, while there is more diffuse edema involving much of the retroperitoneal and intraperitoneal fat.  An apparent small air- fluid level is noted along the right lateral abdominal wall at the right lower quadrant, with fluid tracking adjacent to the right psoas muscle.  Given clinical concern, evolving abscess cannot be  excluded.  The largest collections of fluid are seen at the right iliac fossa, tracking superiorly along the right lateral abdominal wall, and at the right renal fossa.  The liver is unremarkable in appearance.  Scattered calcifications within the spleen appear vascular in nature.  There is persistent distension of the gallbladder. Prominence of the pancreatic duct to 0.6 cm appears stable from the prior study.  There is no definite evidence of distal obstruction; the pancreas is otherwise grossly unremarkable in appearance.  The adrenal glands are within normal  limits.  The patient is status post right-sided nephrectomy, with associated postoperative change.  Numerous cysts are seen arising from the left kidney, with marked atrophy of the left kidney.  There is no evidence of hydronephrosis.  There is diffuse edema within the visualized omentum and mesentery, with associated areas of free fluid.  An apparent ileocolic anastomosis is noted at the right mid abdomen, with an overlying ileostomy.  There is significant soft tissue edema with respect to the ileostomy, and a small amount of surrounding fluid.  The remaining small bowel is grossly unremarkable in appearance. There is significant distension of the redundant transverse colon, again filled with fluid and air.  Mild diverticulosis is noted along the distal descending and proximal sigmoid colon; a rectal tube is noted ending at the mid sigmoid colon.  No acute vascular abnormalities are seen.  There is diffuse calcification along the abdominal aorta and its branches.  An anterior midline incision defect is noted, with overlying skin staples.  There is no evidence of wound dehiscence at this time.  The bladder is decompressed and not well assessed.  The uterus is grossly unremarkable, though difficult to assess due to surrounding fluid.  The ovaries are not well characterized; no suspicious adnexal masses are seen.  A small amount of free fluid is noted within the pelvis, demonstrating somewhat increased attenuation; this may simply be postoperative in nature.  No acute osseous abnormalities are seen.  The patient is status post lumbar spinal fusion at L4-S1, with associated decompression.  IMPRESSION:  1.  Multiple collections of fluid noted along the right side of the abdomen.  These are not well defined without contrast.  Some of the collections with increased attenuation likely reflect hematomas; evaluation is obscured by diffuse edema involving much of the retroperitoneal and intraperitoneal fat.  The largest  collections of fluid are seen at the right iliac fossa, tracking superiorly along the right lateral abdominal wall and at the right renal fossa; given clinical concern, an evolving abscess cannot be excluded. 2.  Associated scattered foci of free air noted about the fluid collections; trace residual free air about the hepatic dome is likely postoperative in nature. 3.  Significant edema noted at the ileostomy, with a small amount of surrounding fluid; the ileocolic anastomosis is grossly unremarkable in appearance, though there is significant persistent distension of the redundant sigmoid colon, again diffusely filled with fluid and air.  The patient's rectal tube is noted ending at the mid sigmoid colon; the sigmoid colon remains filled with fluid. 4.  Diffuse edema within the visualized omentum and mesentery, with associated areas of free fluid; small amount of free fluid within the pelvis demonstrates mildly increased attenuation.  This may simply be postoperative in nature. 5.  Small bilateral pleural effusions, left greater than right, with associated partial consolidation of the left lower lobe.  This may reflect atelectasis or possibly pneumonia. 6.  Persistent distension of the pancreatic duct to 0.6 cm,  without definite evidence of distal obstruction; the gallbladder remains distended.  7.  Anterior midline incision effect, with overlying skin staples; no evidence of wound dehiscence at this time. 8.  Diffuse calcification along the abdominal aorta and its branches. 9.  Mild diverticulosis along the distal descending and proximal sigmoid colon. 10.  Mild cardiomegaly; scattered coronary artery calcifications seen. 11.  Marked left renal atrophy, with prominent cysts; status post right-sided nephrectomy.  Findings were discussed with Judson Roch RN on [id number] at 03:02 a.m. on 02/14/2011.  Original Report Authenticated By: Santa Lighter, M.D.   Dg Chest Port 1 View  02/15/2011  *RADIOLOGY REPORT*  Clinical  Data: Follow up infiltrate.  PORTABLE CHEST - 1 VIEW  Comparison: 02/13/2011.  Findings: Right central line tip proximal to mid superior vena cava level.  No gross pneumothorax.  Surgical clips project over the right hilum.  Bibasilar infiltrates/atelectasis more notable on the left with minimal change.  Cardiomegaly.  Calcified tortuous aorta.  Central pulmonary vascular prominence.  IMPRESSION: No significant change basilar atelectasis/infiltrates more notable on the left.  Cardiomegaly.  Calcified tortuous aorta.  Original Report Authenticated By: Doug Sou, M.D.   Dg Chest Port 1 View  02/13/2011  *RADIOLOGY REPORT*  Clinical Data: Fever.  Pneumonia.  PORTABLE CHEST - 1 VIEW  Comparison: Chest x-ray 02/09/2011.  Findings: The right IJ catheter is stable.  The NG tube has been removed.  The heart is mildly enlarged but stable.  There is tortuosity and calcification of the thoracic aorta.  Bibasilar lung opacity could reflect atelectasis or infiltrates.  No effusions or edema.  IMPRESSION: Bibasilar airspace opacity, likely atelectasis but cannot exclude developing infiltrates.  Original Report Authenticated By: P. Kalman Jewels, M.D.     Anti-infectives:   Anti-infectives     Start     Dose/Rate Route Frequency Ordered Stop   02/14/11 1200   vancomycin (VANCOCIN) 500 mg in sodium chloride 0.9 % 100 mL IVPB        500 mg 100 mL/hr over 60 Minutes Intravenous Every Hemodialysis 02/11/11 1122     02/13/11 1700   fluconazole (DIFLUCAN) IVPB 200 mg        200 mg 100 mL/hr over 60 Minutes Intravenous Every 48 hours 02/13/11 1642     02/12/11 1200   vancomycin (VANCOCIN) 500 mg in sodium chloride 0.9 % 100 mL IVPB  Status:  Discontinued        500 mg 100 mL/hr over 60 Minutes Intravenous Every T-Th-Sa (Hemodialysis) 02/09/11 2050 02/11/11 1120   02/11/11 1600   vancomycin (VANCOCIN) IVPB 1000 mg/200 mL premix        1,000 mg 200 mL/hr over 60 Minutes Intravenous  Once 02/11/11 1119      02/09/11 2200  piperacillin-tazobactam (ZOSYN) IVPB 2.25 g       2.25 g 100 mL/hr over 30 Minutes Intravenous 3 times per day 02/09/11 2050     02/09/11 2100   piperacillin-tazobactam (ZOSYN) IVPB 3.375 g  Status:  Discontinued        3.375 g 12.5 mL/hr over 240 Minutes Intravenous Every 8 hours 02/09/11 2034 02/09/11 2050          Alphonsa Overall, MD, [place] Pager: [phone],   [place] [place] [facility] Office: [phone] 02/15/2011

## 2011-02-15 NOTE — Progress Notes (Signed)
Physical Therapy Treatment Patient Details Name: [name] MRN: [id number] DOB: [birth year] Today's Date: 02/15/2011  PT Assessment/Plan  PT - Assessment/Plan Comments on Treatment Session: Pt's mobility primarily limited by bil leg weakness.  Per chart review, appears weakness has been present at least since admision to Rehab unit on 11/16, however seems to have progressed with limited activity since abd surgery.   PT Plan: Discharge plan remains appropriate;Frequency needs to be updated PT Frequency: Min 4X/week Follow Up Recommendations: Inpatient Rehab Equipment Recommended: Defer to next venue PT Goals  Acute Rehab PT Goals PT Goal: Rolling Supine to Left Side - Progress: Progressing toward goal PT Goal: Sit to Supine/Side - Progress: Progressing toward goal PT Transfer Goal: Sit to Stand/Stand to Sit - Progress: Progressing toward goal PT Goal: Ambulate - Progress: Progressing toward goal PT Goal: Up/Down Stairs - Progress: Other (comment) (not addressed)  PT Treatment Precautions/Restrictions  Precautions Precautions: Back Precaution Comments: Back brace Required Braces or Orthoses: Yes Spinal Brace: Lumbar corset;Applied in standing position Other Brace/Splint: Aspen corset Restrictions Weight Bearing Restrictions: No Mobility (including Balance) Pt with incr dyspnea and HR (130s) with activity. Bed Mobility Rolling Left: 4: Min assist;With rail Rolling Left Details (indicate cue type and reason): max cues for sequencing to maintain back precautions Left Sidelying to Sit: 3: Mod assist;HOB flat;With rails Left Sidelying to Sit Details (indicate cue type and reason): vc to maintain back precautions (especially no twisting) Transfers Sit to Stand: 1: +2 Total assist;Patient percentage (comment);From bed;With upper extremity assist Sit to Stand Details (indicate cue type and reason): pt=50%; pt prefers Rt hand on RW and Lt hand pushing off bed; RW stabilized by  therapist Stand to Sit: 1: +2 Total assist;Patient percentage (comment);Without upper extremity assist;To chair/3-in-1 Stand to Sit Details: pt=20; Pt very fatigued as she approached the chair; uncontrolled descent with bil hands still on RW Ambulation/Gait Ambulation/Gait Assistance: 1: +2 Total assist;Patient percentage (comment) Ambulation/Gait Assistance Details (indicate cue type and reason): pt=30-60%; Pt uses hip flexion with bil knee hyperextension to keep knees in extension; uses hip flexion to "throw" legs forward (due to weakness and bil foot drop); knees buckled x1 requiring 2 person assist to maintain standing Ambulation Distance (Feet): 19 Feet Assistive device: Rolling walker Gait Pattern: Decreased dorsiflexion - right;Decreased dorsiflexion - left;Right foot flat;Left foot flat;Right genu recurvatum;Left genu recurvatum;Trunk flexed    Exercise    End of Session PT - End of Session Equipment Utilized During Treatment: Gait belt;Back brace Activity Tolerance: Patient limited by fatigue (dyspnea 4/4 with HR 133; pt required 2 standing rest breaks) Patient left: in chair;with call bell in reach;with family/visitor present Nurse Communication: Mobility status for transfers General Behavior During Session: [facility] for tasks performed Cognition: [facility] for tasks performed  Darrow Barreiro 02/15/2011, 11:58 AM Pager [phone]

## 2011-02-15 NOTE — Progress Notes (Signed)
Patient name: [name] Medical record number: [id number] Date of birth: 1950-06-04 Age: 60 y.o. Gender: female PCP: No primary provider on file.  Date: 02/15/2011 Reason for Consult:  tachycardia, fever Referring Physician:   Moshe Mccarthy (Renal)  Brief history 60 yo female with hx ESRD, anemia, mult back surgeries admitted 02/09/2011 with ischemic bowel and underwent exp lap with R hemicolectomy and ileostomy 11/24.  Recovering on floor 11/28 when she developed tachycardia, fever (105) and worsening abd pain and PCCM consulted.   Lines/tubes R IJ CVL 11/24>>>  Culture data/sepsis markers BCx 2 11/21>>>neg BCx2 11/26>>>NGTD Urine 11/28>>>anuric Lactate 11/28>>>1.1 Pct 11/28>>>68.49  Antibiotics Vanc 11/26>>> Zosyn 11/26>>> Diflucan 11/28>>>   Events/studies 11/29>>HGB dropped to 6.5. Hematochezia. Transfuse 1 unit PRBCs prior to HD per renal. Transfused 2 units PRBCs in HD 11/29>>HD 11/29 CT ABD/Pelvis>> CT-scan of abdomen and pelvis   IMPRESSION:  1. Multiple collections of fluid noted along the right side of the abdomen. These are not well defined without contrast. Some of the collections with increased attenuation likely reflect hematomas; evaluation is obscured by diffuse edema involving much of the retroperitoneal and intraperitoneal fat.  The largest collections of fluid are seen at the right iliac fossa, tracking superiorly along the right lateral abdominal wall and at the right renal fossa; given clinical concern, an evolving abscess cannot be excluded. 2. Associated scattered foci of free air noted about the fluid collections; trace residual free air about the hepatic dome is likely postoperative in nature. 3. Significant edema noted at the ileostomy, with a small amount of surrounding fluid; the ileocolic anastomosis is grossly unremarkable in appearance, though there is significant persistent distension of the redundant sigmoid colon, again diffusely  filled with fluid and air. The patient's rectal tube is noted ending at the mid sigmoid colon; the sigmoid colon remains filled with fluid. 4. Diffuse edema within the visualized omentum and mesentery, with associated areas of free fluid; small amount of free fluid within the pelvis demonstrates mildly increased attenuation. This may simply be postoperative in nature. 5. Small bilateral pleural effusions, left greater than right, with associated partial consolidation of the left lower lobe. This may reflect atelectasis or possibly pneumonia. 6. Persistent distension of the pancreatic duct to 0.6 cm, without definite evidence of distal obstruction; the gallbladder remains distended.  7. Anterior midline incision effect, with overlying skin staples; no evidence of wound dehiscence at this time. 8. Diffuse calcification along the abdominal aorta and its branches. 9. Mild diverticulosis along the distal descending and proximal sigmoid colon. 10. Mild cardiomegaly; scattered coronary artery calcifications seen. 11. Marked left renal atrophy, with prominent cysts; status post right-sided nephrectomy.      Vitals: Filed Vitals:   02/15/11 0115 02/15/11 0341 02/15/11 0400 02/15/11 0836  BP: 113/74  103/58 104/76  Pulse:    110  Temp: 98.3 F (36.8 C)  98 F (36.7 C) 98.5 F (36.9 C)  TempSrc: Oral  Oral Oral  Resp: 20   21  Height:      Weight:  134 lb 7.7 oz (61 kg)    SpO2:    98%       Intake/Output Summary (Last 24 hours) at 02/15/11 1113 Last data filed at 02/15/11 0400  Gross per 24 hour  Intake 2113.33 ml  Output   1350 ml  Net 763.33 ml   States pain is controlled. Feeling much better. Mild ABD pain. Isolated temp (101.8) overnight. PT working /c pt Physical exam General: Well nourished, alert and  appropriate female, NAD Neuro: Awake and alert, appropriate CV: s1s2 rrr, + murmur PULM:  resps even non labored on Strum, diminished bases GI: abd soft, midline  incision with staples intact, tender RLQ, BS active x 4 quadrants. + colostomy Extremities: warm and dry, no edema   Radiology  none  LAB RESULT Lab Results  Component Value Date   CREATININE 3.44* 02/15/2011   BUN 17 02/15/2011   NA 141 02/15/2011   K 3.7 02/15/2011   CL 100 02/15/2011   CO2 26 02/15/2011   Lab Results  Component Value Date   WBC 14.0* 02/15/2011   HGB 8.6* 02/15/2011   HCT 25.2* 02/15/2011   MCV 84.0 02/15/2011   PLT 420* 02/15/2011   Lab Results  Component Value Date   ALT 10 02/14/2011   Lab Results  Component Value Date   INR 1.54* 02/15/2011     Assessment and Plan  1. Tachycardia - likely multifactorial r/t pain and fever +/- volume depletion with high ileostomy outpt. Also with blood bowel movements seen today. Adequate pain control on PCA. Improved.  PLAN -  Continue pain control per primary team See fever Volume per renal   2. Fever - unknown etiology - s/p hemicolectomy for ischemic bowel. Afebrile 11/29. Surgery following.  See 11/29 ABD/pelvis CT results-per surgery. PCT 68.49. Lactate 1.1. Cx pending. Follow CXR for developing pneumonia/infiltrates. On broad spectrum ABX. Isolated temp (101.8) overnight.  Recent Labs  Basename 02/15/11 0600 02/14/11 0420 02/13/11 0500   WBC 14.0* 18.1* 18.6*  PLAN -  Cont broad spectrum abx and antifungal; if re-spikes over weekend would discuss perc drain vs. Repeat CT abdomen with surgery? Tylenol  3. ESRD - per renal.   PLAN -  Per renal  F/u chem  4. Ischemic bowel - s/p hemicolectomy.  PLAN -  Per surgery:   5. Anemia- Acute blood loss 2/2 #4. Large hematochezia (per rectum) 11/29. Transfused 3 units PRBCs 02/14/11. PLAN- -serial hct -per surgery   6. S/P lumbar surgery- Dr. Sherwood Mccarthy following. PT and back brace 02/15/11  7. Heartburn/dyspepsia Plan: -PPI -Mylanta  Feeding: per Sx Analgesia: prn morphine Sedation: n/a Thromboprophylaxis: scd HOB >30 degrees Ulcer  prophylaxis: start ppi Glucose control: monitor cbg  PCCM will sign off and remain available for services as needed. Thank you for the opportunity to participate in [name]'s care.    Jenna Hanlon, NP student 02/15/2011, 11:13 AM   I have seen and examined the patient and agree with above.  Overall despite requiring three units of blood she looks much better and feels much better today.  We will sign off, but are available if needed.  MCQUAID, DOUGLAS

## 2011-02-16 ENCOUNTER — Inpatient Hospital Stay (HOSPITAL_COMMUNITY): Payer: Medicare Other

## 2011-02-16 LAB — DIFFERENTIAL
Basophils Relative: 1 % (ref 0–1)
Basophils Relative: 1 % (ref 0–1)
Eosinophils Absolute: 0.2 10*3/uL (ref 0.0–0.7)
Eosinophils Relative: 3 % (ref 0–5)
Eosinophils Relative: 1 % (ref 0–5)
Lymphocytes Relative: 7 % — ABNORMAL LOW (ref 12–46)
Monocytes Absolute: 1.5 10*3/uL — ABNORMAL HIGH (ref 0.1–1.0)
Monocytes Absolute: 1.4 10*3/uL — ABNORMAL HIGH (ref 0.1–1.0)
Monocytes Relative: 12 % (ref 3–12)
Neutrophils Relative %: 74 % (ref 43–77)
Neutrophils Relative %: 82 % — ABNORMAL HIGH (ref 43–77)

## 2011-02-16 LAB — CBC
Hemoglobin: 8.4 g/dL — ABNORMAL LOW (ref 12.0–15.0)
Hemoglobin: 8.5 g/dL — ABNORMAL LOW (ref 12.0–15.0)
MCH: 29.3 pg (ref 26.0–34.0)
MCH: 28.9 pg (ref 26.0–34.0)
MCHC: 32.6 g/dL (ref 30.0–36.0)
MCV: 85.7 fL (ref 78.0–100.0)
Platelets: 465 10*3/uL — ABNORMAL HIGH (ref 150–400)
Platelets: 465 10*3/uL — ABNORMAL HIGH (ref 150–400)
RBC: 2.87 MIL/uL — ABNORMAL LOW (ref 3.87–5.11)
RBC: 2.94 MIL/uL — ABNORMAL LOW (ref 3.87–5.11)
WBC: 12.9 10*3/uL — ABNORMAL HIGH (ref 4.0–10.5)

## 2011-02-16 LAB — PROTIME-INR
INR: 1.3 (ref 0.00–1.49)
Prothrombin Time: 16.4 seconds — ABNORMAL HIGH (ref 11.6–15.2)

## 2011-02-16 LAB — BASIC METABOLIC PANEL
CO2: 27 mEq/L (ref 19–32)
Chloride: 100 mEq/L (ref 96–112)
Creatinine, Ser: 4.62 mg/dL — ABNORMAL HIGH (ref 0.50–1.10)
GFR calc Af Amer: 11 mL/min — ABNORMAL LOW (ref >90)
Potassium: 3.5 mEq/L (ref 3.5–5.1)
Sodium: 136 mEq/L (ref 135–145)

## 2011-02-16 LAB — GLUCOSE, CAPILLARY: Glucose-Capillary: 110 mg/dL — ABNORMAL HIGH (ref 70–99)

## 2011-02-16 LAB — PREPARE RBC (CROSSMATCH)

## 2011-02-16 MED ORDER — RENA-VITE PO TABS
1.0000 | ORAL_TABLET | Freq: Every day | ORAL | Status: DC
  Administered 2011-02-16 – 2011-02-25 (×9): 1 via ORAL
  Filled 2011-02-16 (×10): qty 1

## 2011-02-16 MED ORDER — SODIUM CHLORIDE 0.9 % IJ SOLN
125.0000 mg | INTRAVENOUS | Status: AC
  Administered 2011-02-16 – 2011-02-21 (×3): 125 mg via INTRAVENOUS
  Filled 2011-02-16 (×7): qty 10

## 2011-02-16 MED ORDER — SODIUM CHLORIDE 0.9 % IJ SOLN
INTRAMUSCULAR | Status: AC
  Filled 2011-02-16: qty 20

## 2011-02-16 NOTE — Progress Notes (Signed)
Subjective: She is back from dialysis and had a BRB BM about 250 cc. No more abd pain and has tol diet. Pulse inc since dialysis  Objective: Vital signs in last 24 hours: Temp:  [97.7 F (36.5 C)-101.7 F (38.7 C)] 101.7 F (38.7 C) (12/01 1544) Pulse Rate:  [93-126] 126  (12/01 1544) Resp:  [15-27] 23  (12/01 1544) BP: (96-142)/(57-84) 119/61 mmHg (12/01 1544) SpO2:  [90 %-98 %] 98 % (12/01 1544) Weight:  [134 lb 0.6 oz (60.8 kg)-138 lb 0.1 oz (62.6 kg)] 134 lb 0.6 oz (60.8 kg) (12/01 1209) Last BM Date: 02/13/11  Intake/Output from previous day: 11/30 0701 - 12/01 0700 In: 890 [P.O.:680; IV Piggyback:210] Out: 900 [Stool:900] Intake/Output this shift: Total I/O In: -  Out: 2000 [Other:2000]  General appearance: alert, cooperative and mild distress GI: Abd soft, mildly tender not distended - wound OK  Lab Results:   Basename 02/16/11 0500 02/15/11 1719  WBC 12.9* 12.5*  HGB 8.4* 8.6*  HCT 24.6* 25.4*  PLT 465* 473*   BMET  Basename 02/16/11 0500 02/15/11 1115  NA 136 140  K 3.5 3.4*  CL 100 100  CO2 27 25  GLUCOSE 95 71  BUN 26* 19  CREATININE 4.62* 3.69*  CALCIUM 6.8* 7.2*   PT/INR  Basename 02/15/11 0145  LABPROT 18.8*  INR 1.54*   ABG No results found for this basename: PHART:2,PCO2:2,PO2:2,HCO3:2 in the last 72 hours  Studies/Results: Dg Chest Port 1 View  02/15/2011  *RADIOLOGY REPORT*  Clinical Data: Follow up infiltrate.  PORTABLE CHEST - 1 VIEW  Comparison: 02/13/2011.  Findings: Right central line tip proximal to mid superior vena cava level.  No gross pneumothorax.  Surgical clips project over the right hilum.  Bibasilar infiltrates/atelectasis more notable on the left with minimal change.  Cardiomegaly.  Calcified tortuous aorta.  Central pulmonary vascular prominence.  IMPRESSION: No significant change basilar atelectasis/infiltrates more notable on the left.  Cardiomegaly.  Calcified tortuous aorta.  Original Report Authenticated By:  Doug Sou, M.D.    Anti-infectives: Anti-infectives     Start     Dose/Rate Route Frequency Ordered Stop   02/14/11 1200   vancomycin (VANCOCIN) 500 mg in sodium chloride 0.9 % 100 mL IVPB        500 mg 100 mL/hr over 60 Minutes Intravenous Every Hemodialysis 02/11/11 1122     02/13/11 1700   fluconazole (DIFLUCAN) IVPB 200 mg        200 mg 100 mL/hr over 60 Minutes Intravenous Every 48 hours 02/13/11 1642     02/12/11 1200   vancomycin (VANCOCIN) 500 mg in sodium chloride 0.9 % 100 mL IVPB  Status:  Discontinued        500 mg 100 mL/hr over 60 Minutes Intravenous Every T-Th-Sa (Hemodialysis) 02/09/11 2050 02/11/11 1120   02/11/11 1600   vancomycin (VANCOCIN) IVPB 1000 mg/200 mL premix        1,000 mg 200 mL/hr over 60 Minutes Intravenous  Once 02/11/11 1119     02/09/11 2200  piperacillin-tazobactam (ZOSYN) IVPB 2.25 g       2.25 g 100 mL/hr over 30 Minutes Intravenous 3 times per day 02/09/11 2050     02/09/11 2100   piperacillin-tazobactam (ZOSYN) IVPB 3.375 g  Status:  Discontinued        3.375 g 12.5 mL/hr over 240 Minutes Intravenous Every 8 hours 02/09/11 2034 02/09/11 2050          Assessment/Plan: S/P R colectomy and  hartmann for ischemic colitis with post op bleeding from the defunctional colon - possible more ischemic colitis, but may have had heparin during dialysis. Will recheck Hgb but was 8.4 this am so will give 1 PRBC now. Still febrile (101) but lungs clear currently  Will also check coags  LOS: 7 days    [name] J 02/16/2011

## 2011-02-16 NOTE — Progress Notes (Signed)
Filed Vitals:   02/16/11 0800 02/16/11 0812 02/16/11 0830 02/16/11 0900  BP: 102/68  109/73 128/76  Pulse: 106  103 97  Temp:      TempSrc:      Resp: 27 19 17 16   Height:      Weight:      SpO2:  90%      CBC  Basename 02/16/11 0500 02/15/11 1719  WBC 12.9* 12.5*  HGB 8.4* 8.6*  HCT 24.6* 25.4*  PLT 465* 473*   BMET  Basename 02/16/11 0500 02/15/11 1115  NA 136 140  K 3.5 3.4*  CL 100 100  CO2 27 25  GLUCOSE 95 71  BUN 26* 19  CREATININE 4.62* 3.69*  CALCIUM 6.8* 7.2*    Patient seen in the dialysis unit while undergoing dialysis. She reports that she did well with physical therapy yesterday but feels tired today. Nonetheless we do hope that she will be seen by PT and OT today.  Exam again shows bilateral foot drops but good plantar flexion bilaterally.   Plan: Patient will need to continue to stabilize from the having undergone her right hemicolectomy and from her anemia and to medical conditions including end-stage renal disease. However once stable I do feel that she will benefit from comprehensive inpatient and once she is stable to return to the [facility]. for now she will need to continue with PT and OT.

## 2011-02-16 NOTE — Progress Notes (Signed)
Subjective:  Pain ok; not eating much; is drinking nepro Objective Vital signs in last 24 hours: Filed Vitals:   02/16/11 0300 02/16/11 0731 02/16/11 0750 02/16/11 0755  BP: 112/63 114/67 111/67 106/68  Pulse: 101 100 98 93  Temp: 98.3 F (36.8 C) 97.7 F (36.5 C) 98.3 F (36.8 C)   TempSrc: Oral Oral Oral   Resp: 20 22 24 22   Height:      Weight:   62.6 kg (138 lb 0.1 oz)   SpO2: 97% 97% 96%    Weight change:   Intake/Output Summary (Last 24 hours) at 02/16/11 0807 Last data filed at 02/16/11 0600  Gross per 24 hour  Intake    890 ml  Output    900 ml  Net    -10 ml   Labs: Basic Metabolic Panel:  Lab [id number] 0500 02/15/11 1115 02/15/11 0600 02/13/11 0500 02/12/11 0246 02/11/11 0455  NA 136 140 141 -- -- --  K 3.5 3.4* 3.7 -- -- --  CL 100 100 100 -- -- --  CO2 27 25 26  -- -- --  GLUCOSE 95 71 80 -- -- --  BUN 26* 19 17 -- -- --  CREATININE 4.62* 3.69* 3.44* -- -- --  CALCIUM 6.8* 7.2* 7.1* -- -- --  ALB -- -- -- -- -- --  PHOS -- -- -- 3.9 3.2 5.1*   Liver Function Tests:  Lab 02/14/11 0420 02/13/11 0500 02/12/11 0246  AST 22 -- --  ALT 10 -- --  ALKPHOS 146* -- --  BILITOT 0.4 -- --  PROT 6.0 -- --  ALBUMIN 1.5* 1.6* 1.6*  CBC:  Lab 02/16/11 0500 02/15/11 1719 02/15/11 0600 02/14/11 0420 02/13/11 0500  WBC 12.9* 12.5* 14.0* -- --  NEUTROABS 9.6* -- -- 13.9* --  HGB 8.4* 8.6* 8.6* -- --  HCT 24.6* 25.4* 25.2* -- --  MCV 85.7 84.4 84.0 86.6 86.3  PLT 465* 473* 420* -- --   Cardiac Enzymes: No results found for this basename: CKTOTAL:5,CKMB:5,CKMBINDEX:5,TROPONINI:5 in the last 168 hours CBG:  Lab 02/16/11 0728 02/15/11 2154 02/15/11 1657 02/15/11 1142 02/15/11 0731  GLUCAP 118* 110* 160* 78 72    Iron Studies: No results found for this basename: IRON,TIBC,TRANSFERRIN,FERRITIN in the last 72 hours Studies/Results: Dg Chest Port 1 View  02/15/2011  *RADIOLOGY REPORT*  Clinical Data: Follow up infiltrate.  PORTABLE CHEST - 1 VIEW  Comparison:  02/13/2011.  Findings: Right central line tip proximal to mid superior vena cava level.  No gross pneumothorax.  Surgical clips project over the right hilum.  Bibasilar infiltrates/atelectasis more notable on the left with minimal change.  Cardiomegaly.  Calcified tortuous aorta.  Central pulmonary vascular prominence.  IMPRESSION: No significant change basilar atelectasis/infiltrates more notable on the left.  Cardiomegaly.  Calcified tortuous aorta.  Original Report Authenticated By: Doug Sou, M.D.   Medications:    . DISCONTD: sodium chloride 50 mL/hr at 02/14/11 1200      . calcium carbonate  400 mg of elemental calcium Oral TID WC  . darbepoetin (ARANESP) injection - DIALYSIS  200 mcg Intravenous Q Tue-HD  . feeding supplement (NEPRO CARB STEADY)  237 mL Oral TID WC  . fluconazole (DIFLUCAN) IV  200 mg Intravenous Q48H  . morphine   Intravenous Q4H  . morphine      . pantoprazole (PROTONIX) IV  40 mg Intravenous Q12H  . piperacillin-tazobactam (ZOSYN)  IV  2.25 g Intravenous Q8H  . vancomycin  1,000 mg Intravenous  Once    I  have reviewed scheduledmedications.  Physical Exam: on HD with PCA pump in hand; wt 62.6 General:  NAD Heart: RRR Lungs: CTA Abdomen: soft + BS w/colostomy Extremities: no LE edema Dialysis Access: left AVGG BFR  Problem/Plan: 1. S/p hemicolectomy for ischemic bowel - keep Systolic BP > 123XX123 on HD; adequate pain control 2. ESRD - TTS on 4 K bath for low K; likely due to high output ostomy; add multivite 3. Anemia - max ESA Fe 21, 20% sat; ferritin 2400, but procalcitonin also 68.;  Add several doses of iron; elevated ferritin is inflammatory response. 4. Secondary hyperparathyroidism -adjusted Ca 8.8; on Tums  5. HTN/volume - ok 6. Nutrition - albumin very low; increase protein in diet; on nepro TID 7. ID - Vanc, Zosyn and diflucan ; Tmax 99.5  Myriam Jacobson, PA-C [place] [phone]  02/16/2011,8:07 AM  LOS: 7 days    Patient seen and examined. I agree with A/P as above. Kelly Splinter, MD 02/16/2011  11:30 PM

## 2011-02-16 NOTE — Progress Notes (Signed)
Physical Therapy Treatment Patient Details Name: [name] MRN: [id number] DOB: Dec 06, 1950 Today's Date: 02/16/2011  Attempted PT x 2 today. Pt in HD both times. Will reattempt today as time allows vs follow up tomorrow.     Willow Ora 02/16/2011, 12:04 PM  Willow Ora, PTA Office- [phone]

## 2011-02-17 LAB — TYPE AND SCREEN
ABO/RH(D): B POS
Antibody Screen: NEGATIVE
Unit division: 0
Unit division: 0
Unit division: 0

## 2011-02-17 MED ORDER — DIPHENHYDRAMINE HCL 50 MG/ML IJ SOLN: 12.5000 mg | Freq: Four times a day (QID) | INTRAMUSCULAR | Status: DC | PRN

## 2011-02-17 MED ORDER — DIPHENHYDRAMINE HCL 12.5 MG/5ML PO ELIX
12.5000 mg | ORAL_SOLUTION | Freq: Four times a day (QID) | ORAL | Status: DC | PRN
  Filled 2011-02-17: qty 5

## 2011-02-17 MED ORDER — SODIUM CHLORIDE 0.9 % IJ SOLN: 9.0000 mL | INTRAMUSCULAR | Status: DC | PRN

## 2011-02-17 MED ORDER — NALOXONE HCL 0.4 MG/ML IJ SOLN: 0.4000 mg | INTRAMUSCULAR | Status: DC | PRN

## 2011-02-17 MED ORDER — MORPHINE SULFATE (PF) 1 MG/ML IV SOLN
INTRAVENOUS | Status: DC
  Administered 2011-02-18: 17:00:00 via INTRAVENOUS
  Administered 2011-02-18: 14 mg via INTRAVENOUS
  Administered 2011-02-18: 3 mg via INTRAVENOUS
  Administered 2011-02-18: 04:00:00 via INTRAVENOUS
  Administered 2011-02-18: 12 mg via INTRAVENOUS
  Administered 2011-02-19: 11 mg via INTRAVENOUS
  Administered 2011-02-19: 03:00:00 via INTRAVENOUS
  Filled 2011-02-17 (×3): qty 25

## 2011-02-17 NOTE — Progress Notes (Signed)
Subjective:    No specific complaints.  Was having bloody stool, seems to be improving.  Got PRBC's last night per family.   No CBC for today in chart.   Objective Vital signs in last 24 hours: Filed Vitals:   02/17/11 0000 02/17/11 0330 02/17/11 0740 02/17/11 0800  BP: 112/69 113/73  117/65  Pulse: 95 94  101  Temp: 98.1 F (36.7 C) 98.3 F (36.8 C)  98.6 F (37 C)  TempSrc: Oral Oral  Oral  Resp: 21 11 25 14   Height:      Weight:  60.8 kg (134 lb 0.6 oz)    SpO2: 99% 98% 98% 96%   Weight change:   Intake/Output Summary (Last 24 hours) at 02/17/11 1130 Last data filed at 02/16/11 2225  Gross per 24 hour  Intake    660 ml  Output   2000 ml  Net  -1340 ml   Labs: Basic Metabolic Panel:  Lab [id number] 0500 02/15/11 1115 02/15/11 0600 02/13/11 0500 02/12/11 0246 02/11/11 0455  NA 136 140 141 -- -- --  K 3.5 3.4* 3.7 -- -- --  CL 100 100 100 -- -- --  CO2 27 25 26  -- -- --  GLUCOSE 95 71 80 -- -- --  BUN 26* 19 17 -- -- --  CREATININE 4.62* 3.69* 3.44* -- -- --  CALCIUM 6.8* 7.2* 7.1* -- -- --  ALB -- -- -- -- -- --  PHOS -- -- -- 3.9 3.2 5.1*   CBC:  Lab 02/16/11 1730 02/16/11 0500 02/15/11 1719 02/15/11 0600 02/14/11 0420  WBC 15.2* 12.9* 12.5* -- --  NEUTROABS 12.3* 9.6* -- -- 13.9*  HGB 8.5* 8.4* 8.6* -- --  HCT 26.1* 24.6* 25.4* -- --  MCV 88.8 85.7 84.4 84.0 86.6  PLT 465* 465* 473* -- --   CBG:  Lab 02/16/11 0728 02/15/11 2154 02/15/11 1657 02/15/11 1142 02/15/11 0731  GLUCAP 118* 110* 160* 78 72   Medications:      . calcium carbonate  400 mg of elemental calcium Oral TID WC  . darbepoetin (ARANESP) injection - DIALYSIS  200 mcg Intravenous Q Tue-HD  . feeding supplement (NEPRO CARB STEADY)  237 mL Oral TID WC  . ferric glucontate (NULECIT) IV  125 mg Intravenous Q T,Th,Sa-HD  . fluconazole (DIFLUCAN) IV  200 mg Intravenous Q48H  . morphine   Intravenous Q4H  . multivitamin  1 tablet Oral Daily  . pantoprazole (PROTONIX) IV  40 mg Intravenous  Q12H  . piperacillin-tazobactam (ZOSYN)  IV  2.25 g Intravenous Q8H  . sodium chloride      . vancomycin  1,000 mg Intravenous Once    I  have reviewed scheduled medications.  Physical Exam:  General: resting comfortably Heart: tachy regular - approx 100 Lungs: CTA  Abdomen: soft + BS w/colostomy  Extremities: tr LE edema; left arm noted to be swollen; no acute tenderness or erythema;  Dialysis Access:right  AVGG patent  Problem/Plan:  1. S/p hemicolectomy for ischemic bowel - keep Systolic BP > 123XX123 on HD; adequate pain control  2,. Left arm swelling - she noted after abdominal surgery. Not sure if she ever had an IV in that arm. Has never had dialysis access in that arm; consider doppler to r/o DVT -defer to Dr. Jonnie Finner 2. ESRD - TTS post wt 60.8; net UF2 L ; next HD Tuesday; had postop BRBPR seems to have resolved - would still do no heparin HD 3. Anemia - max  ESA Fe 21, 20% sat; ferritin 2400, but procalcitonin also 68.; Add several doses of iron; elevated ferritin is inflammatory response; check labs in am.  4. Secondary hyperparathyroidism -adjusted Ca ok on Tums  5. HTN/volume - significantly above prior EDW of 53 (7 kg over).  Edema seems to be generalized; does not explain left arm swelling. Gently lower volume. 6. Nutrition - albumin very low; increase protein in diet; on nepro TID  7. ID - Vanc, Zosyn and diflucan ;afebrile 24 hr 8. Disp - planning back to rehab when stable.   Myriam Jacobson, PA-C [place]  02/17/2011,11:30 AM  LOS: 8 days   Pt seen and examined.  Next dialysis Tuesday, continue to remove volume (vol excess) and avoid heparin with post-op LGIB from "defunctionalized" colon.   Starting IV iron.  Will order doppler of LUE to eval for DVT.  Check Hb in am and at least daily.  Kelly Splinter, MD 02/17/2011  6:43 PM

## 2011-02-17 NOTE — Progress Notes (Signed)
Filed Vitals:   02/17/11 0000 02/17/11 0330 02/17/11 0740 02/17/11 0800  BP: 112/69 113/73  117/65  Pulse: 95 94  101  Temp: 98.1 F (36.7 C) 98.3 F (36.8 C)  98.6 F (37 C)  TempSrc: Oral Oral  Oral  Resp: 21 11 25 14   Height:      Weight:  60.8 kg (134 lb 0.6 oz)    SpO2: 99% 98% 98% 96%    CBC  Basename 02/16/11 1730 02/16/11 0500  WBC 15.2* 12.9*  HGB 8.5* 8.4*  HCT 26.1* 24.6*  PLT 465* 465*   BMET  Basename 02/16/11 0500 02/15/11 1115  NA 136 140  K 3.5 3.4*  CL 100 100  CO2 27 25  GLUCOSE 95 71  BUN 26* 19  CREATININE 4.62* 3.69*  CALCIUM 6.8* 7.2*    Patient resting in bed she did get up with the nurse earlier today she does complain of discomfort into the lower extremities. She does continue on a morphine PCA. She does continue with physical therapy and occupational therapy   Plan: Continue PT and OT and once medically stable from her laparotomy and hemicolectomy and stable from a nephrology perspective she certainly can be transferred back to the inpatient rehabilitation center.

## 2011-02-17 NOTE — Progress Notes (Signed)
ANTIBIOTIC CONSULT NOTE - FOLLOW UP  Pharmacy Consult for Vancomycin/Zosyn/Diflucan Indication: rule out sepsis/intra-abdominal infection  Allergies  Allergen Reactions  . Contrast Media (Iodinated Diagnostic Agents)     Seziure  . Iohexol Other (See Comments)    Reaction is convulsions    Patient Measurements: Height: 5\' 5"  (165.1 cm) Weight: 134 lb 0.6 oz (60.8 kg) IBW/kg (Calculated) : 57   Vital Signs: Temp: 98.6 F (37 C) (12/02 0800) Temp src: Oral (12/02 0800) BP: 117/65 mmHg (12/02 0800) Pulse Rate: 101  (12/02 0800) Intake/Output from previous day: 12/01 0701 - 12/02 0700 In: 660 [I.V.:500; IV Piggyback:160] Out: 2000  Intake/Output from this shift:    Labs:  Basename 02/16/11 1730 02/16/11 0500 02/15/11 1719 02/15/11 1115 02/15/11 0600  WBC 15.2* 12.9* 12.5* -- --  HGB 8.5* 8.4* 8.6* -- --  PLT 465* 465* 473* -- --  LABCREA -- -- -- -- --  CREATININE -- 4.62* -- 3.69* 3.44*   Estimated Creatinine Clearance: 11.7 ml/min (by C-G formula based on Cr of 4.62). No results found for this basename: VANCOTROUGH:2,VANCOPEAK:2,VANCORANDOM:2,GENTTROUGH:2,GENTPEAK:2,GENTRANDOM:2,TOBRATROUGH:2,TOBRAPEAK:2,TOBRARND:2,AMIKACINPEAK:2,AMIKACINTROU:2,AMIKACIN:2, in the last 72 hours   Microbiology: Recent Results (from the past 720 hour(s))  SURGICAL PCR SCREEN     Status: Normal   Collection Time   01/25/11  9:10 AM      Component Value Range Status Comment   MRSA, PCR NEGATIVE  NEGATIVE  Final    Staphylococcus aureus NEGATIVE  NEGATIVE  Final   CULTURE, BLOOD (ROUTINE X 2)     Status: Normal   Collection Time   02/03/11  4:09 PM      Component Value Range Status Comment   Specimen Description BLOOD LEFT ARM   Final    Special Requests     Final    Value: BOTTLES DRAWN AEROBIC AND ANAEROBIC 8CC AER, [place] ANA   Setup Time [id number]   Final    Culture NO GROWTH 5 DAYS   Final    Report Status 02/09/2011 FINAL   Final   CULTURE, BLOOD (ROUTINE X 2)     Status:  Normal   Collection Time   02/03/11  4:19 PM      Component Value Range Status Comment   Specimen Description BLOOD LEFT HAND   Final    Special Requests BOTTLES DRAWN AEROBIC ONLY [place]   Final    Setup Time [id number]   Final    Culture NO GROWTH 5 DAYS   Final    Report Status 02/09/2011 FINAL   Final   CULTURE, BLOOD (ROUTINE X 2)     Status: Normal   Collection Time   02/06/11  5:40 PM      Component Value Range Status Comment   Specimen Description BLOOD HEMODIALYSIS GRAFT   Final    Special Requests BOTTLES DRAWN AEROBIC AND ANAEROBIC 10CC   Final    Setup Time [id number]   Final    Culture NO GROWTH 5 DAYS   Final    Report Status 02/13/2011 FINAL   Final   CULTURE, BLOOD (ROUTINE X 2)     Status: Normal   Collection Time   02/06/11  6:40 PM      Component Value Range Status Comment   Specimen Description BLOOD HEMODIALYSIS GRAFT   Final    Special Requests BOTTLES DRAWN AEROBIC AND ANAEROBIC 10CC   Final    Setup Time [id number]   Final    Culture NO GROWTH 5 DAYS   Final  Report Status 02/13/2011 FINAL   Final   CULTURE, BLOOD (ROUTINE X 2)     Status: Normal (Preliminary result)   Collection Time   02/11/11  8:54 PM      Component Value Range Status Comment   Specimen Description BLOOD LEFT ARM   Final    Special Requests     Final    Value: BOTTLES DRAWN AEROBIC AND ANAEROBIC 10CC AER 5CC ANA   Setup Time [id number]   Final    Culture     Final    Value:        BLOOD CULTURE RECEIVED NO GROWTH TO DATE CULTURE WILL BE HELD FOR 5 DAYS BEFORE ISSUING A FINAL NEGATIVE REPORT   Report Status PENDING   Incomplete   CULTURE, BLOOD (ROUTINE X 2)     Status: Normal (Preliminary result)   Collection Time   02/11/11  8:54 PM      Component Value Range Status Comment   Specimen Description BLOOD LEFT HAND   Final    Special Requests BOTTLES DRAWN AEROBIC AND ANAEROBIC 10CC   Final    Setup Time [id number]   Final    Culture     Final    Value:        BLOOD  CULTURE RECEIVED NO GROWTH TO DATE CULTURE WILL BE HELD FOR 5 DAYS BEFORE ISSUING A FINAL NEGATIVE REPORT   Report Status PENDING   Incomplete     Anti-infectives     Start     Dose/Rate Route Frequency Ordered Stop   02/14/11 1200   vancomycin (VANCOCIN) 500 mg in sodium chloride 0.9 % 100 mL IVPB        500 mg 100 mL/hr over 60 Minutes Intravenous Every Hemodialysis 02/11/11 1122     02/13/11 1700   fluconazole (DIFLUCAN) IVPB 200 mg        200 mg 100 mL/hr over 60 Minutes Intravenous Every 48 hours 02/13/11 1642     02/12/11 1200   vancomycin (VANCOCIN) 500 mg in sodium chloride 0.9 % 100 mL IVPB  Status:  Discontinued        500 mg 100 mL/hr over 60 Minutes Intravenous Every T-Th-Sa (Hemodialysis) 02/09/11 2050 02/11/11 1120   02/11/11 1600   vancomycin (VANCOCIN) IVPB 1000 mg/200 mL premix        1,000 mg 200 mL/hr over 60 Minutes Intravenous  Once 02/11/11 1119     02/09/11 2200  piperacillin-tazobactam (ZOSYN) IVPB 2.25 g       2.25 g 100 mL/hr over 30 Minutes Intravenous 3 times per day 02/09/11 2050     02/09/11 2100   piperacillin-tazobactam (ZOSYN) IVPB 3.375 g  Status:  Discontinued        3.375 g 12.5 mL/hr over 240 Minutes Intravenous Every 8 hours 02/09/11 2034 02/09/11 2050          Assessment: 60 y/o female patient receiving day#11 vancomycin/ day#8 zosyn and day #5 diflucan. Remains afebrile. All cx ngtd. Vancomycin dosed post -HD, abx doses appropriate for dialysis.   Goal of Therapy:  Pre-HD vanc level 15-25.   Plan:  Will cont abx and plan to check pre-HD with next dialysis session. Consider streamline abx if no infection present.  Davonna Belling, PharmD, BCPS Pager [phone]  02/17/2011,10:40 AM

## 2011-02-17 NOTE — Progress Notes (Signed)
  Subjective: Feels better todal. Taking a little more in PO, no nausea, stool via colostomy, no more BRB per rectum since last night  Objective: Vital signs in last 24 hours: Temp:  [98 F (36.7 C)-101.7 F (38.7 C)] 98.6 F (37 C) (12/02 0800) Pulse Rate:  [94-126] 101  (12/02 0800) Resp:  [11-27] 14  (12/02 0800) BP: (95-142)/(60-84) 117/65 mmHg (12/02 0800) SpO2:  [91 %-100 %] 96 % (12/02 0800) Weight:  [134 lb 0.6 oz (60.8 kg)] 134 lb 0.6 oz (60.8 kg) (12/02 0330) Last BM Date: 02/13/11  Intake/Output from previous day: 12/01 0701 - 12/02 0700 In: 660 [I.V.:500; IV Piggyback:160] Out: 2000  Intake/Output this shift:    General appearance: alert and no distress GI: Soft and not tender today. Incision/Wound:  Lab Results:   Basename 02/16/11 1730 02/16/11 0500  WBC 15.2* 12.9*  HGB 8.5* 8.4*  HCT 26.1* 24.6*  PLT 465* 465*   BMET  Basename 02/16/11 0500 02/15/11 1115  NA 136 140  K 3.5 3.4*  CL 100 100  CO2 27 25  GLUCOSE 95 71  BUN 26* 19  CREATININE 4.62* 3.69*  CALCIUM 6.8* 7.2*   PT/INR  Basename 02/16/11 1730 02/15/11 0145  LABPROT 16.4* 18.8*  INR 1.30 1.54*   ABG No results found for this basename: PHART:2,PCO2:2,PO2:2,HCO3:2 in the last 72 hours  Studies/Results: No results found.  Anti-infectives: Anti-infectives     Start     Dose/Rate Route Frequency Ordered Stop   02/14/11 1200   vancomycin (VANCOCIN) 500 mg in sodium chloride 0.9 % 100 mL IVPB        500 mg 100 mL/hr over 60 Minutes Intravenous Every Hemodialysis 02/11/11 1122     02/13/11 1700   fluconazole (DIFLUCAN) IVPB 200 mg        200 mg 100 mL/hr over 60 Minutes Intravenous Every 48 hours 02/13/11 1642     02/12/11 1200   vancomycin (VANCOCIN) 500 mg in sodium chloride 0.9 % 100 mL IVPB  Status:  Discontinued        500 mg 100 mL/hr over 60 Minutes Intravenous Every T-Th-Sa (Hemodialysis) 02/09/11 2050 02/11/11 1120   02/11/11 1600   vancomycin (VANCOCIN) IVPB 1000  mg/200 mL premix        1,000 mg 200 mL/hr over 60 Minutes Intravenous  Once 02/11/11 1119     02/09/11 2200   piperacillin-tazobactam (ZOSYN) IVPB 2.25 g        2.25 g 100 mL/hr over 30 Minutes Intravenous 3 times per day 02/09/11 2050     02/09/11 2100   piperacillin-tazobactam (ZOSYN) IVPB 3.375 g  Status:  Discontinued        3.375 g 12.5 mL/hr over 240 Minutes Intravenous Every 8 hours 02/09/11 2034 02/09/11 2050          Assessment/Plan: s/p R colectomy and iliostomy Stable sugically with no additional bleeding. Will continue to follow but no changes today from our standpoint  LOS: 8 days    [name] 02/17/2011

## 2011-02-18 DIAGNOSIS — M7989 Other specified soft tissue disorders: Secondary | ICD-10-CM

## 2011-02-18 LAB — CULTURE, BLOOD (ROUTINE X 2)
Culture  Setup Time: 201211270120
Culture: NO GROWTH

## 2011-02-18 LAB — CBC
HCT: 28.6 % — ABNORMAL LOW (ref 36.0–46.0)
HCT: 29 % — ABNORMAL LOW (ref 36.0–46.0)
Hemoglobin: 9.6 g/dL — ABNORMAL LOW (ref 12.0–15.0)
MCH: 29.6 pg (ref 26.0–34.0)
MCHC: 33.6 g/dL (ref 30.0–36.0)
MCV: 88.3 fL (ref 78.0–100.0)
MCV: 88.7 fL (ref 78.0–100.0)
Platelets: 425 10*3/uL — ABNORMAL HIGH (ref 150–400)
Platelets: 482 10*3/uL — ABNORMAL HIGH (ref 150–400)
RBC: 3.24 MIL/uL — ABNORMAL LOW (ref 3.87–5.11)
RBC: 3.27 MIL/uL — ABNORMAL LOW (ref 3.87–5.11)
RDW: 16.1 % — ABNORMAL HIGH (ref 11.5–15.5)
WBC: 14.5 10*3/uL — ABNORMAL HIGH (ref 4.0–10.5)
WBC: 15.1 10*3/uL — ABNORMAL HIGH (ref 4.0–10.5)

## 2011-02-18 LAB — RENAL FUNCTION PANEL
Albumin: 1.6 g/dL — ABNORMAL LOW (ref 3.5–5.2)
BUN: 23 mg/dL (ref 6–23)
CO2: 26 mEq/L (ref 19–32)
Calcium: 6.9 mg/dL — ABNORMAL LOW (ref 8.4–10.5)
Chloride: 98 mEq/L (ref 96–112)
Creatinine, Ser: 4.59 mg/dL — ABNORMAL HIGH (ref 0.50–1.10)
GFR calc Af Amer: 11 mL/min — ABNORMAL LOW (ref >90)
GFR calc non Af Amer: 10 mL/min — ABNORMAL LOW (ref >90)
Glucose, Bld: 111 mg/dL — ABNORMAL HIGH (ref 70–99)
Phosphorus: 2.7 mg/dL (ref 2.3–4.6)
Potassium: 4 mEq/L (ref 3.5–5.1)
Sodium: 135 mEq/L (ref 135–145)

## 2011-02-18 LAB — DIFFERENTIAL
Basophils Absolute: 0.1 10*3/uL (ref 0.0–0.1)
Basophils Relative: 1 % (ref 0–1)
Eosinophils Absolute: 0.3 10*3/uL (ref 0.0–0.7)
Eosinophils Relative: 2 % (ref 0–5)
Lymphocytes Relative: 8 % — ABNORMAL LOW (ref 12–46)
Lymphs Abs: 1.2 10*3/uL (ref 0.7–4.0)
Monocytes Absolute: 2 10*3/uL — ABNORMAL HIGH (ref 0.1–1.0)
Monocytes Relative: 14 % — ABNORMAL HIGH (ref 3–12)
Neutro Abs: 10.9 10*3/uL — ABNORMAL HIGH (ref 1.7–7.7)
Neutrophils Relative %: 75 % (ref 43–77)

## 2011-02-18 LAB — BASIC METABOLIC PANEL
BUN: 27 mg/dL — ABNORMAL HIGH (ref 6–23)
CO2: 24 mEq/L (ref 19–32)
Chloride: 100 mEq/L (ref 96–112)
Creatinine, Ser: 5.14 mg/dL — ABNORMAL HIGH (ref 0.50–1.10)

## 2011-02-18 LAB — MAGNESIUM: Magnesium: 1.8 mg/dL (ref 1.5–2.5)

## 2011-02-18 MED ORDER — SODIUM CHLORIDE 0.9 % IJ SOLN
INTRAMUSCULAR | Status: AC
  Filled 2011-02-18: qty 10

## 2011-02-18 MED ORDER — SODIUM CHLORIDE 0.9 % IJ SOLN
INTRAMUSCULAR | Status: AC
  Filled 2011-02-18: qty 20

## 2011-02-18 NOTE — Progress Notes (Signed)
Pt incontinent of third bloody stool with clots.  HR now 120s, BP 128/69, pt without complaint.  Will [place] PA notified.  Pt with previous orders to monitor H/H.    Vista Lawman RN

## 2011-02-18 NOTE — Progress Notes (Signed)
Patient ID: [name], female   DOB: 11/01/1950, 60 y.o.   MRN: [id number] Patient passed some old looking clots PR. Will check labs.  I spoke to daughters in detail at bedside.  Could be some old blood form Saturday and is either from staple line or due to ischemic area.  If it continues we may need to remove the remainder of the colon. Jewelia Bocchino E

## 2011-02-18 NOTE — Progress Notes (Signed)
PRELIMINARY  PRELIMINARY  PRELIMINARY  PRELIMINARY    Left upper extremity venous duplex  completed.    Preliminary report:  Left:  No evidence of DVT.  Chronic cephalic vein thrombosis in the upper arm.  Interstitial fluid in forearm and upper arm.  Incidental finding:  Hyperechoic area measuring 2.2cm x 1.5cm noted in the left neck.     Judithann Sauger 02/18/2011 11:07 AM

## 2011-02-18 NOTE — Progress Notes (Signed)
Occupational Therapy Evaluation Patient Details Name: [name] MRN: [id number] DOB: 29-Dec-1950 Today's Date: 02/18/2011  OT Assessment/Plan OT Assessment/Plan Comments on Treatment Session: OT treatment limited today due to increased HR and large bloody stool. OT Plan: Discharge plan remains appropriate OT Frequency: Min 2X/week Follow Up Recommendations: Inpatient Rehab Equipment Recommended: Defer to next venue OT Goals ADL Goals ADL Goal: Toilet Transfer - Progress: Not addressed  OT Treatment Precautions/Restrictions  Precautions Precautions: Back Precaution Comments: Pt. instructed in back precautions Required Braces or Orthoses: No (bed mobility only due to increased HR)   ADL ADL Toileting - Hygiene: Maximal assistance;Performed Where Assessed - Toileting Hygiene: Rolling right and/or left (Pt. incontinent large amount of bloody stool) Equipment Used:  (none) ADL Comments: Spoke with RN prior to OT.  Pt in bed.  On attempt to begin OOB activity, pt. incontinent large amount of bloody stool with multiple clots.  RN made aware.  Pt. log rolled Lt. and Rt. for peri anal hygiene - supervision with min cues for rolling.  Pt. with HR increasing to 135 with rolling.  OT discontinued and RN made aware. Mobility  Bed Mobility Bed Mobility: Yes Rolling Right: 5: Supervision (min  verbal cues for back precautions with rails) Rolling Left: 5: Set up (min verbal cues for back precautions with rails) Transfers Transfers: No Exercises    End of Session OT - End of Session Equipment Utilized During Treatment:  (none) Activity Tolerance: Treatment limited secondary to medical complications (Comment) Patient left: in chair;with call bell in reach Nurse Communication:  (RN made aware of increased HR and bloody stool) General Behavior During Session: [facility] for tasks performed Cognition: [facility] for tasks performed  Adali Pennings, Ellard Artis M  02/18/2011, 5:22 PM

## 2011-02-18 NOTE — Progress Notes (Signed)
I continue to follow patient's progress medically and with therapy to determine rehabiltiation venue needs. Please call me with any questions pager [phone].

## 2011-02-18 NOTE — Progress Notes (Signed)
  Subjective: No blood per rectum since Saturday, tol PO  Objective: Vital signs in last 24 hours: Temp:  [98.3 F (36.8 C)-100.5 F (38.1 C)] 98.3 F (36.8 C) (12/03 0800) Pulse Rate:  [98-111] 98  (12/03 0800) Resp:  [13-25] 17  (12/03 0800) BP: (112-130)/(64-75) 124/73 mmHg (12/03 0800) SpO2:  [95 %-100 %] 98 % (12/03 0800) Last BM Date: 02/13/11  Intake/Output from previous day: 12/02 0701 - 12/03 0700 In: -  Out: 400 [Stool:400] Intake/Output this shift: Total I/O In: -  Out: 300 [Stool:300]  General appearance: alert Resp: Few rales B GI: Soft, NT, ND, ostomy with output, wound CDI with staples  Lab Results:   [facility] 02/18/11 0411 02/16/11 1730  WBC 14.5* 15.2*  HGB 9.6* 8.5*  HCT 28.6* 26.1*  PLT 425* 465*   BMET  Basename 02/18/11 0411 02/16/11 0500  NA 135 136  K 4.0 3.5  CL 98 100  CO2 26 27  GLUCOSE 111* 95  BUN 23 26*  CREATININE 4.59* 4.62*  CALCIUM 6.9* 6.8*   PT/INR  Basename 02/16/11 1730  LABPROT 16.4*  INR 1.30   ABG No results found for this basename: PHART:2,PCO2:2,PO2:2,HCO3:2 in the last 72 hours  Studies/Results: No results found.  Anti-infectives: Anti-infectives     Start     Dose/Rate Route Frequency Ordered Stop   02/14/11 1200   vancomycin (VANCOCIN) 500 mg in sodium chloride 0.9 % 100 mL IVPB        500 mg 100 mL/hr over 60 Minutes Intravenous Every Hemodialysis 02/11/11 1122     02/13/11 1700   fluconazole (DIFLUCAN) IVPB 200 mg        200 mg 100 mL/hr over 60 Minutes Intravenous Every 48 hours 02/13/11 1642     02/12/11 1200   vancomycin (VANCOCIN) 500 mg in sodium chloride 0.9 % 100 mL IVPB  Status:  Discontinued        500 mg 100 mL/hr over 60 Minutes Intravenous Every T-Th-Sa (Hemodialysis) 02/09/11 2050 02/11/11 1120   02/11/11 1600   vancomycin (VANCOCIN) IVPB 1000 mg/200 mL premix        1,000 mg 200 mL/hr over 60 Minutes Intravenous  Once 02/11/11 1119     02/09/11 2200   piperacillin-tazobactam  (ZOSYN) IVPB 2.25 g        2.25 g 100 mL/hr over 30 Minutes Intravenous 3 times per day 02/09/11 2050     02/09/11 2100   piperacillin-tazobactam (ZOSYN) IVPB 3.375 g  Status:  Discontinued        3.375 g 12.5 mL/hr over 240 Minutes Intravenous Every 8 hours 02/09/11 2034 02/09/11 2050          Assessment/Plan:  POD# 9 S/P right colectomy and ileostomy - tolerating diet, no further blood per rectum for over 24 hours, Hb OK today, likely D/C staples Thursday ESRD - HD per renal S/P back surgery - per Dr. Janeece Fitting E

## 2011-02-18 NOTE — Progress Notes (Signed)
Subjective:  Patient sitting up in chair currently undergoing Doppler study of left upper extremity because of swelling. Patient notes that she's been up and walking in the room with assistance and her nurse Larene Beach confirms that, she has not yet been ambulating in the halls, but I do feel that she needs to progress to that.  Larene Beach also notes that they have been continuing to do dry dressing changes for the low back but she feels the wound is healed enough at this point that she no longer needs dressings and the dressings have been discontinued.  Objective: Vital signs in last 24 hours: Filed Vitals:   02/18/11 0000 02/18/11 0310 02/18/11 0729 02/18/11 0800  BP: 119/68 112/68  124/73  Pulse: 110 109  98  Temp: 99.9 F (37.7 C) 98.6 F (37 C)  98.3 F (36.8 C)  TempSrc: Oral Oral  Oral  Resp: 20 24 23 17   Height:      Weight:      SpO2: 95% 96% 100% 98%    Intake/Output from previous day: 12/02 0701 - 12/03 0700 In: -  Out: 400 [Stool:400] Intake/Output this shift: Total I/O In: -  Out: 300 [Stool:300]  Physical Exam:  Motor exam is unchanged at this time. Patient go has steadily over the past few days and able to be more mobile.  CBC  Basename 02/18/11 0411 02/16/11 1730  WBC 14.5* 15.2*  HGB 9.6* 8.5*  HCT 28.6* 26.1*  PLT 425* 465*   BMET  Basename 02/18/11 0411 02/16/11 0500  NA 135 136  K 4.0 3.5  CL 98 100  CO2 26 27  GLUCOSE 111* 95  BUN 23 26*  CREATININE 4.59* 4.62*  CALCIUM 6.9* 6.8*    Studies/Results: No results found.  Assessment/Plan: Continuing PT and OT, awaiting medical and general surgical stability to transfer back to rehabilitation.   Hosie Spangle, MD 02/18/2011, 10:39 AM

## 2011-02-18 NOTE — Progress Notes (Signed)
Subjective:    No specific complaints.  Still having very low grade temps.  Report no new bleeding, hgb is up.   Objective Vital signs in last 24 hours: Filed Vitals:   02/18/11 0000 02/18/11 0310 02/18/11 0729 02/18/11 0800  BP: 119/68 112/68  124/73  Pulse: 110 109  98  Temp: 99.9 F (37.7 C) 98.6 F (37 C)  98.3 F (36.8 C)  TempSrc: Oral Oral  Oral  Resp: 20 24 23 17   Height:      Weight:      SpO2: 95% 96% 100% 98%   Weight change:   Intake/Output Summary (Last 24 hours) at 02/18/11 0938 Last data filed at 02/18/11 0800  Gross per 24 hour  Intake      0 ml  Output    450 ml  Net   -450 ml   Labs: Basic Metabolic Panel:  Lab [id number] 02/16/11 0500 02/15/11 1115 02/13/11 0500 02/12/11 0246  NA 135 136 140 -- --  K 4.0 3.5 3.4* -- --  CL 98 100 100 -- --  CO2 26 27 25  -- --  GLUCOSE 111* 95 71 -- --  BUN 23 26* 19 -- --  CREATININE 4.59* 4.62* 3.69* -- --  CALCIUM 6.9* 6.8* 7.2* -- --  ALB -- -- -- -- --  PHOS 2.7 -- -- 3.9 3.2   CBC:  Lab 02/18/11 0411 02/16/11 1730 02/16/11 0500 02/15/11 1719 02/15/11 0600  WBC 14.5* 15.2* 12.9* -- --  NEUTROABS 10.9* 12.3* 9.6* -- --  HGB 9.6* 8.5* 8.4* -- --  HCT 28.6* 26.1* 24.6* -- --  MCV 88.3 88.8 85.7 84.4 84.0  PLT 425* 465* 465* -- --   CBG:  Lab 02/17/11 1240 02/17/11 0753 02/16/11 0728 02/15/11 2154 02/15/11 1657  GLUCAP 92 107* 118* 110* 160*   Medications:      . calcium carbonate  400 mg of elemental calcium Oral TID WC  . darbepoetin (ARANESP) injection - DIALYSIS  200 mcg Intravenous Q Tue-HD  . feeding supplement (NEPRO CARB STEADY)  237 mL Oral TID WC  . ferric glucontate (NULECIT) IV  125 mg Intravenous Q T,Th,Sa-HD  . fluconazole (DIFLUCAN) IV  200 mg Intravenous Q48H  . morphine   Intravenous Q4H  . multivitamin  1 tablet Oral Daily  . pantoprazole (PROTONIX) IV  40 mg Intravenous Q12H  . piperacillin-tazobactam (ZOSYN)  IV  2.25 g Intravenous Q8H  . sodium chloride      . vancomycin   1,000 mg Intravenous Once  . DISCONTD: morphine   Intravenous Q4H    I  have reviewed scheduled medications.  Physical Exam:  General: resting comfortably Heart: tachy regular - approx 100 Lungs: CTA  Abdomen: soft + BS w/colostomy  Extremities: tr LE edema; left arm noted to be swollen; no acute tenderness or erythema;  Dialysis Access:right  AVGG patent  Problem/Plan:  1. S/p hemicolectomy for ischemic bowel - keep Systolic BP > [id number] on HD; adequate pain control on PCA, when do we wean??  2,. Left arm swelling - she noted after abdominal surgery. To get doppler to r/o DVT 2. ESRD - TTS post wt 60.8; net UF2 L ; next HD Tuesday;no heparin 3. Anemia - max ESA Fe 21, 20% sat; ferritin 2400, but procalcitonin also 68.; Add several doses of iron;  4. Secondary hyperparathyroidism -adjusted Ca ok on Tums  5. HTN/volume - significantly above prior EDW of 53 (7 kg over).  Edema seems to be  generalized; does not explain left arm swelling. Gently lower volume. 6. Nutrition - albumin very low; increase protein in diet; on nepro TID  7. ID - Vanc, Zosyn and diflucan ;afebrile 24 hr- not sure of end points of antibiotics, maybe 14 days.  Fever curve and WBC do seem to be improving. 8. Disp - planning back to rehab when stable.  Will transfer to 6700 today 9.  Will stop checking sugars, have been good   [name] A

## 2011-02-18 NOTE — Progress Notes (Signed)
Pt has had 2 blood stools this afternoon.  [facility] aware, Dr. Grandville Silos and Modena Jansky PA both came to pt's bedside and spoke with pt and family.  Dr Moshe Cipro called and notified of above.  Transfer order cancelled.  Pt VSS at this time.  Will continue to monitor.  Vista Lawman

## 2011-02-19 ENCOUNTER — Encounter (HOSPITAL_COMMUNITY): Admission: AD | Disposition: A | Discharge status: Rehabilitation Facility | Payer: Self-pay | Attending: Nephrology

## 2011-02-19 ENCOUNTER — Inpatient Hospital Stay (HOSPITAL_COMMUNITY): Payer: Medicare Other

## 2011-02-19 ENCOUNTER — Encounter (HOSPITAL_COMMUNITY): Payer: Self-pay | Admitting: *Deleted

## 2011-02-19 HISTORY — PX: COLONOSCOPY: SHX5424

## 2011-02-19 LAB — CBC
MCH: 29.2 pg (ref 26.0–34.0)
MCHC: 32.7 g/dL (ref 30.0–36.0)
MCV: 89.2 fL (ref 78.0–100.0)
Platelets: 379 10*3/uL (ref 150–400)
RDW: 16.2 % — ABNORMAL HIGH (ref 11.5–15.5)
WBC: 14.7 10*3/uL — ABNORMAL HIGH (ref 4.0–10.5)

## 2011-02-19 LAB — BASIC METABOLIC PANEL
BUN: 31 mg/dL — ABNORMAL HIGH (ref 6–23)
Calcium: 6.6 mg/dL — ABNORMAL LOW (ref 8.4–10.5)
Creatinine, Ser: 6.06 mg/dL — ABNORMAL HIGH (ref 0.50–1.10)
GFR calc Af Amer: 8 mL/min — ABNORMAL LOW (ref >90)
GFR calc non Af Amer: 7 mL/min — ABNORMAL LOW (ref >90)
Glucose, Bld: 72 mg/dL (ref 70–99)
Potassium: 4.2 mEq/L (ref 3.5–5.1)

## 2011-02-19 LAB — RENAL FUNCTION PANEL
BUN: 33 mg/dL — ABNORMAL HIGH (ref 6–23)
CO2: 21 mEq/L (ref 19–32)
Chloride: 101 mEq/L (ref 96–112)
Creatinine, Ser: 6.46 mg/dL — ABNORMAL HIGH (ref 0.50–1.10)
GFR calc non Af Amer: 6 mL/min — ABNORMAL LOW (ref >90)
Potassium: 4.3 mEq/L (ref 3.5–5.1)

## 2011-02-19 LAB — HEMOGLOBIN AND HEMATOCRIT, BLOOD
HCT: 29.4 % — ABNORMAL LOW (ref 36.0–46.0)
Hemoglobin: 10 g/dL — ABNORMAL LOW (ref 12.0–15.0)

## 2011-02-19 SURGERY — COLONOSCOPY
Anesthesia: Moderate Sedation

## 2011-02-19 MED ORDER — OXYCODONE-ACETAMINOPHEN 5-325 MG PO TABS
2.0000 | ORAL_TABLET | Freq: Three times a day (TID) | ORAL | Status: DC | PRN
  Administered 2011-02-19 – 2011-02-20 (×3): 2 via ORAL
  Filled 2011-02-19 (×3): qty 2

## 2011-02-19 MED ORDER — FENTANYL NICU IV SYRINGE 50 MCG/ML
INJECTION | INTRAMUSCULAR | Status: DC | PRN
  Administered 2011-02-19 (×3): 25 ug via INTRAVENOUS

## 2011-02-19 MED ORDER — VANCOMYCIN HCL 1000 MG IV SOLR
750.0000 mg | INTRAVENOUS | Status: DC
  Administered 2011-02-19 – 2011-02-21 (×2): 750 mg via INTRAVENOUS
  Filled 2011-02-19 (×4): qty 750

## 2011-02-19 MED ORDER — SODIUM CHLORIDE 0.9 % IJ SOLN
INTRAMUSCULAR | Status: AC
  Administered 2011-02-19: 10 mL
  Filled 2011-02-19: qty 10

## 2011-02-19 MED ORDER — MIDAZOLAM HCL 10 MG/2ML IJ SOLN
INTRAMUSCULAR | Status: AC
  Filled 2011-02-19: qty 2

## 2011-02-19 MED ORDER — FENTANYL CITRATE 0.05 MG/ML IJ SOLN
INTRAMUSCULAR | Status: AC
  Filled 2011-02-19: qty 2

## 2011-02-19 MED ORDER — MIDAZOLAM HCL 5 MG/5ML IJ SOLN
INTRAMUSCULAR | Status: DC | PRN
  Administered 2011-02-19: 2 mg via INTRAVENOUS
  Administered 2011-02-19: 1 mg via INTRAVENOUS
  Administered 2011-02-19: 2 mg via INTRAVENOUS

## 2011-02-19 MED ORDER — DARBEPOETIN ALFA-POLYSORBATE 200 MCG/0.4ML IJ SOLN
INTRAMUSCULAR | Status: AC
  Administered 2011-02-19: 200 ug via INTRAVENOUS
  Filled 2011-02-19: qty 0.4

## 2011-02-19 NOTE — Progress Notes (Signed)
ANTIBIOTIC CONSULT NOTE - FOLLOW UP  Pharmacy Consult for vancomycin Indication: rule out sepsis/intra-abdominal infection  Allergies  Allergen Reactions  . Contrast Media (Iodinated Diagnostic Agents)     Seziure  . Iohexol Other (See Comments)    Reaction is convulsions    Patient Measurements: Height: 5\' 5"  (165.1 cm) Weight: 134 lb 0.6 oz (60.8 kg) IBW/kg (Calculated) : 57   Vital Signs: Temp: 97.9 F (36.6 C) (12/04 0812) Temp src: Oral (12/04 0812) BP: 122/70 mmHg (12/04 0831) Pulse Rate: 107  (12/04 0831) Intake/Output from previous day: 12/03 0701 - 12/04 0700 In: -  Out: 300 [Stool:300] Intake/Output from this shift: Total I/O In: -  Out: 300 [Stool:300]  Labs:  [facility] 02/19/11 0414 02/18/11 1845 02/18/11 1300 02/18/11 0411  WBC 14.7* -- 15.1* 14.5*  HGB 7.0* 8.2* 9.7* --  PLT 379 -- 482* 425*  LABCREA -- -- -- --  CREATININE 6.06* -- 5.14* 4.59*   Estimated Creatinine Clearance: 8.9 ml/min (by C-G formula based on Cr of 6.06).  Basename 02/19/11 0414  VANCOTROUGH --  Corlis Leak --  VANCORANDOM 10.3  GENTTROUGH --  GENTPEAK --  GENTRANDOM --  TOBRATROUGH --  TOBRAPEAK --  TOBRARND --  [place] --  AMIKACIN --     Microbiology: Recent Results (from the past 720 hour(s))  SURGICAL PCR SCREEN     Status: Normal   Collection Time   01/25/11  9:10 AM      Component Value Range Status Comment   MRSA, PCR NEGATIVE  NEGATIVE  Final    Staphylococcus aureus NEGATIVE  NEGATIVE  Final   CULTURE, BLOOD (ROUTINE X 2)     Status: Normal   Collection Time   02/03/11  4:09 PM      Component Value Range Status Comment   Specimen Description BLOOD LEFT ARM   Final    Special Requests     Final    Value: BOTTLES DRAWN AEROBIC AND ANAEROBIC 8CC AER, [place] ANA   Setup Time [id number]   Final    Culture NO GROWTH 5 DAYS   Final    Report Status 02/09/2011 FINAL   Final   CULTURE, BLOOD (ROUTINE X 2)     Status: Normal   Collection  Time   02/03/11  4:19 PM      Component Value Range Status Comment   Specimen Description BLOOD LEFT HAND   Final    Special Requests BOTTLES DRAWN AEROBIC ONLY [place]   Final    Setup Time [id number]   Final    Culture NO GROWTH 5 DAYS   Final    Report Status 02/09/2011 FINAL   Final   CULTURE, BLOOD (ROUTINE X 2)     Status: Normal   Collection Time   02/06/11  5:40 PM      Component Value Range Status Comment   Specimen Description BLOOD HEMODIALYSIS GRAFT   Final    Special Requests BOTTLES DRAWN AEROBIC AND ANAEROBIC 10CC   Final    Setup Time [id number]   Final    Culture NO GROWTH 5 DAYS   Final    Report Status 02/13/2011 FINAL   Final   CULTURE, BLOOD (ROUTINE X 2)     Status: Normal   Collection Time   02/06/11  6:40 PM      Component Value Range Status Comment   Specimen Description BLOOD HEMODIALYSIS GRAFT   Final    Special Requests BOTTLES DRAWN AEROBIC AND ANAEROBIC  10CC   Final    Setup Time [id number]   Final    Culture NO GROWTH 5 DAYS   Final    Report Status 02/13/2011 FINAL   Final   CULTURE, BLOOD (ROUTINE X 2)     Status: Normal   Collection Time   02/11/11  8:54 PM      Component Value Range Status Comment   Specimen Description BLOOD LEFT ARM   Final    Special Requests     Final    Value: BOTTLES DRAWN AEROBIC AND ANAEROBIC 10CC AER 5CC ANA   Setup Time [id number]   Final    Culture NO GROWTH 5 DAYS   Final    Report Status 02/18/2011 FINAL   Final   CULTURE, BLOOD (ROUTINE X 2)     Status: Normal   Collection Time   02/11/11  8:54 PM      Component Value Range Status Comment   Specimen Description BLOOD LEFT HAND   Final    Special Requests BOTTLES DRAWN AEROBIC AND ANAEROBIC 10CC   Final    Setup Time [id number]   Final    Culture NO GROWTH 5 DAYS   Final    Report Status 02/18/2011 FINAL   Final     Anti-infectives     Start     Dose/Rate Route Frequency Ordered Stop   02/19/11 1200   vancomycin (VANCOCIN) 750 mg in sodium chloride  0.9 % 150 mL IVPB        750 mg 150 mL/hr over 60 Minutes Intravenous Every T-Th-Sa (Hemodialysis) 02/19/11 0836     02/14/11 1200   vancomycin (VANCOCIN) 500 mg in sodium chloride 0.9 % 100 mL IVPB  Status:  Discontinued        500 mg 100 mL/hr over 60 Minutes Intravenous Every Hemodialysis 02/11/11 1122 02/19/11 0835   02/13/11 1700   fluconazole (DIFLUCAN) IVPB 200 mg        200 mg 100 mL/hr over 60 Minutes Intravenous Every 48 hours 02/13/11 1642     02/12/11 1200   vancomycin (VANCOCIN) 500 mg in sodium chloride 0.9 % 100 mL IVPB  Status:  Discontinued        500 mg 100 mL/hr over 60 Minutes Intravenous Every T-Th-Sa (Hemodialysis) 02/09/11 2050 02/11/11 1120   02/11/11 1600   vancomycin (VANCOCIN) IVPB 1000 mg/200 mL premix  Status:  Discontinued        1,000 mg 200 mL/hr over 60 Minutes Intravenous  Once 02/11/11 1119 02/19/11 0835   02/09/11 2200  piperacillin-tazobactam (ZOSYN) IVPB 2.25 g       2.25 g 100 mL/hr over 30 Minutes Intravenous 3 times per day 02/09/11 2050     02/09/11 2100   piperacillin-tazobactam (ZOSYN) IVPB 3.375 g  Status:  Discontinued        3.375 g 12.5 mL/hr over 240 Minutes Intravenous Every 8 hours 02/09/11 2034 02/09/11 2050          Assessment: 56 yof on vancomycin D#13, zosyn D#10 and fluconazole D#7 for r/o sepsis and possible intra-abdominal infection. Pt is afebrile and WBC is 14.7. All cultures remain negative. Pre-hemodialysis vancomycin level today is subtherapeutic at 10.3. Unclear duration of therapy.   Goal of Therapy:  Pre-HD vanc level 15-25  Plan:  Change vancomycin to 750mg  IV after each HD Measure antibiotic drug levels at steady state Follow up culture results Please clarify duration of antibiotic therapy  Jane Birkel, Rande Lawman 02/19/2011,8:42 AM

## 2011-02-19 NOTE — Consults (Signed)
Reason for Consult:  GASTROENTEROLOGY CONSULT Referring Physician: General surgery/nephrology  [name] is an 60 y.o. female.  HPI: Patient is a 60 year old African American female who was admitted in early November with low back pain. She underwent lumbar L4-S1 laminectomy 11/14I Dr. Lucia Gaskins. She does have renal failure and is on chronic hemodialysis. While in rehabilitation she developed abdominal pain and was taken to surgery 11/24 By Dr. Ninfa Linden. She was found to have ischemic colitis and underwent right hemicolectomy. An ileostomy for diversion was performed. Since surgery the patient has been supported with hemodialysis, vancomycin/Diflucan/Zosyn. We were asked to see her to consider colonoscopy to evaluate her colon for ongoing ischemia. Patient is currently on hemodialysis. Patient has continued to have some abdominal pain. CT scan 5 days ago showed some fluid collections and grossly normal small bowel and edema of the colonic mesentery. There was continued colonic distention.  Past Medical History  Diagnosis Date  . Seizures     r/t HTN in 1990's x 1  . Brain aneurysm   . Blood transfusion 1990's    r/t Kidney removal surgery  . Arthritis     Back  . Anemia   . Umbilical hernia age 75  . Heart murmur     Born with heart mumur, does not require follow up per pt  . Hypertension     Does not see a heart doctor, had pre transplant stress test at [facility]    . Diabetes mellitus     Borderline  . End stage renal disease on dialysis     T/Th/Sat dialysis on [employer]  . End stage renal disease on dialysis     01-29-2011  . Hepatitis C     Past Surgical History  Procedure Date  . Nephrectomy 2010    right side done at [facility] on transplant list  . Umbilical hernia repair age 36- 38  . Appendectomy 1960's  . Vascular surgery     right arm dialysis graft  . Tonsillectomy     as a child  . Rib resection     d/t kidney removal  . Back surgery 01/30/2011   laminectomy  . Hernia repair   . Laparotomy 02/09/2011    Procedure: EXPLORATORY LAPAROTOMY;  Surgeon: Harl Bowie, MD;  Location: [place];  Service: General;  Laterality: N/A;  . Partial colectomy 02/09/2011    Procedure: PARTIAL COLECTOMY;  Surgeon: Harl Bowie, MD;  Location: [place];  Service: General;  Laterality: Right;  . Colostomy 02/09/2011    Procedure: COLOSTOMY;  Surgeon: Harl Bowie, MD;  Location: [place];  Service: General;  Laterality: Right;    Family History  Problem Relation Age of Onset  . Anesthesia problems Neg Hx     Social History:  reports that she quit smoking about 6 months ago. Her smoking use included Cigarettes. She has a 24 pack-year smoking history. She has never used smokeless tobacco. She reports that she drinks about 1.2 ounces of alcohol per week. She reports that she does not use illicit drugs.  Allergies:  Allergies  Allergen Reactions  . Contrast Media (Iodinated Diagnostic Agents)     Seziure  . Iohexol Other (See Comments)    Reaction is convulsions    Medications: I have reviewed the patient's current medications.   Results for orders placed during the hospital encounter of 02/09/11 (from the past 48 hour(s))  GLUCOSE, CAPILLARY     Status: Normal   Collection Time   02/17/11  12:40 PM      Component Value Range Comment   Glucose-Capillary 92  70 - 99 (mg/dL)    Comment 1 Notify RN     RENAL FUNCTION PANEL     Status: Abnormal   Collection Time   02/18/11  4:11 AM      Component Value Range Comment   Sodium 135  135 - 145 (mEq/L)    Potassium 4.0  3.5 - 5.1 (mEq/L)    Chloride 98  96 - 112 (mEq/L)    CO2 26  19 - 32 (mEq/L)    Glucose, Bld 111 (*) 70 - 99 (mg/dL)    BUN 23  6 - 23 (mg/dL)    Creatinine, Ser 4.59 (*) 0.50 - 1.10 (mg/dL)    Calcium 6.9 (*) 8.4 - 10.5 (mg/dL)    Phosphorus 2.7  2.3 - 4.6 (mg/dL)    Albumin 1.6 (*) 3.5 - 5.2 (g/dL)    GFR calc non Af Amer 10 (*) >90 (mL/min)    GFR calc Af Amer 11  (*) >90 (mL/min)   CBC     Status: Abnormal   Collection Time   02/18/11  4:11 AM      Component Value Range Comment   WBC 14.5 (*) 4.0 - 10.5 (K/uL)    RBC 3.24 (*) 3.87 - 5.11 (MIL/uL)    Hemoglobin 9.6 (*) 12.0 - 15.0 (g/dL)    HCT 28.6 (*) 36.0 - 46.0 (%)    MCV 88.3  78.0 - 100.0 (fL)    MCH 29.6  26.0 - 34.0 (pg)    MCHC 33.6  30.0 - 36.0 (g/dL)    RDW 16.1 (*) 11.5 - 15.5 (%)    Platelets 425 (*) 150 - 400 (K/uL)   DIFFERENTIAL     Status: Abnormal   Collection Time   02/18/11  4:11 AM      Component Value Range Comment   Neutrophils Relative 75  43 - 77 (%)    Lymphocytes Relative 8 (*) 12 - 46 (%)    Monocytes Relative 14 (*) 3 - 12 (%)    Eosinophils Relative 2  0 - 5 (%)    Basophils Relative 1  0 - 1 (%)    Neutro Abs 10.9 (*) 1.7 - 7.7 (K/uL)    Lymphs Abs 1.2  0.7 - 4.0 (K/uL)    Monocytes Absolute 2.0 (*) 0.1 - 1.0 (K/uL)    Eosinophils Absolute 0.3  0.0 - 0.7 (K/uL)    Basophils Absolute 0.1  0.0 - 0.1 (K/uL)    RBC Morphology POLYCHROMASIA PRESENT   TARGET CELLS  CBC     Status: Abnormal   Collection Time   02/18/11  1:00 PM      Component Value Range Comment   WBC 15.1 (*) 4.0 - 10.5 (K/uL)    RBC 3.27 (*) 3.87 - 5.11 (MIL/uL)    Hemoglobin 9.7 (*) 12.0 - 15.0 (g/dL)    HCT 29.0 (*) 36.0 - 46.0 (%)    MCV 88.7  78.0 - 100.0 (fL)    MCH 29.7  26.0 - 34.0 (pg)    MCHC 33.4  30.0 - 36.0 (g/dL)    RDW 16.3 (*) 11.5 - 15.5 (%)    Platelets 482 (*) 150 - 400 (K/uL)   BASIC METABOLIC PANEL     Status: Abnormal   Collection Time   02/18/11  1:00 PM      Component Value Range Comment   Sodium 137  135 - 145 (mEq/L)    Potassium 4.1  3.5 - 5.1 (mEq/L)    Chloride 100  96 - 112 (mEq/L)    CO2 24  19 - 32 (mEq/L)    Glucose, Bld 84  70 - 99 (mg/dL)    BUN 27 (*) 6 - 23 (mg/dL)    Creatinine, Ser 5.14 (*) 0.50 - 1.10 (mg/dL)    Calcium 6.9 (*) 8.4 - 10.5 (mg/dL)    GFR calc non Af Amer 8 (*) >90 (mL/min)    GFR calc Af Amer 10 (*) >90 (mL/min)   MAGNESIUM      Status: Normal   Collection Time   02/18/11  1:00 PM      Component Value Range Comment   Magnesium 1.8  1.5 - 2.5 (mg/dL)   TYPE AND SCREEN     Status: Normal (Preliminary result)   Collection Time   02/18/11  1:00 PM      Component Value Range Comment   ABO/RH(D) B POS      Antibody Screen NEG      Sample Expiration 02/21/2011      Unit Number [id number]      Blood Component Type RED CELLS,LR      Unit division 00      Status of Unit ALLOCATED      Transfusion Status OK TO TRANSFUSE      Crossmatch Result Compatible      Unit Number [id number]      Blood Component Type RED CELLS,LR      Unit division 00      Status of Unit ALLOCATED      Transfusion Status OK TO TRANSFUSE      Crossmatch Result Compatible     HEMOGLOBIN AND HEMATOCRIT, BLOOD     Status: Abnormal   Collection Time   02/18/11  6:45 PM      Component Value Range Comment   Hemoglobin 8.2 (*) 12.0 - 15.0 (g/dL)    HCT 25.3 (*) 36.0 - 46.0 (%)   CBC     Status: Abnormal   Collection Time   02/19/11  4:14 AM      Component Value Range Comment   WBC 14.7 (*) 4.0 - 10.5 (K/uL)    RBC 2.40 (*) 3.87 - 5.11 (MIL/uL)    Hemoglobin 7.0 (*) 12.0 - 15.0 (g/dL)    HCT 21.4 (*) 36.0 - 46.0 (%)    MCV 89.2  78.0 - 100.0 (fL)    MCH 29.2  26.0 - 34.0 (pg)    MCHC 32.7  30.0 - 36.0 (g/dL)    RDW 16.2 (*) 11.5 - 15.5 (%)    Platelets 379  150 - 400 (K/uL)   VANCOMYCIN, RANDOM     Status: Normal   Collection Time   02/19/11  4:14 AM      Component Value Range Comment   Vancomycin Rm 10.3     BASIC METABOLIC PANEL     Status: Abnormal   Collection Time   02/19/11  4:14 AM      Component Value Range Comment   Sodium 137  135 - 145 (mEq/L)    Potassium 4.2  3.5 - 5.1 (mEq/L)    Chloride 101  96 - 112 (mEq/L)    CO2 24  19 - 32 (mEq/L)    Glucose, Bld 72  70 - 99 (mg/dL)    BUN 31 (*) 6 - 23 (mg/dL)    Creatinine, Ser 6.06 (*) 0.50 -  1.10 (mg/dL)    Calcium 6.6 (*) 8.4 - 10.5 (mg/dL)    GFR calc non Af Amer 7 (*) >90  (mL/min)    GFR calc Af Amer 8 (*) >90 (mL/min)   MAGNESIUM     Status: Normal   Collection Time   02/19/11  4:14 AM      Component Value Range Comment   Magnesium 1.9  1.5 - 2.5 (mg/dL)   PREPARE RBC (CROSSMATCH)     Status: Normal   Collection Time   02/19/11  7:21 AM      Component Value Range Comment   Order Confirmation ORDER PROCESSED BY BLOOD BANK       No results found.  ROS: Patient currently on hemodialysis. She denies chest pain or trouble breathing. She denies prior history of diverticulitis or ischemic colitis. She had a colonoscopy a number of years ago and is unable to remember who performed her where it was done. She did not believe anything of significance was found. There is no family history of colon cancer or polyps.  Blood pressure 107/77, pulse 102, temperature 97.9 F (36.6 C), temperature source Oral, resp. rate 18, height 5\' 5"  (1.651 m), weight 61.4 kg (135 lb 5.8 oz), SpO2 97.00%.  Physical exam:  General: Alert and oriented African American female laying in bed on hemodialysis. Eyes sclerae are nonicteric. Neck: Supple no lymphadenopathy Lungs: Clear anteriorly and laterally did not have her set up since she is on dialysis Heart: Loud holosystolic murmur Abdomen: Bowel sounds present ostomy in the right lower quadrant did not remove. Mild tenderness more right-sided and lower than upper   Assessment/Plan: 1. Rectal bleeding and continued abdominal pain. 2. Status post right hemicolectomy urgently for right-sided ischemic colitis. 3. End stage renal disease on hemodialysis for. Lumbar stenosis and spondylolisthesis status post lumbosacral spine surgery. 4. History of hepatitis C genotype and known  Plan: We'll go ahead and arrange colonoscopy/sigmoidoscopy following her dialysis. We will prep her with tapwater enemas. I have discussed the procedure with the patient in the risk and benefits. She has had this done before and understands.  Elene Downum JR,Yarielys Beed  L 02/19/2011, 9:27 AM

## 2011-02-19 NOTE — Brief Op Note (Addendum)
02/09/2011 - 02/19/2011  3:25 PM  PATIENT:  Ovidio Hanger  60 y.o. female  PRE-OPERATIVE DIAGNOSIS:  Rectal bleed  POST-OPERATIVE DIAGNOSIS:  ischemic colitis  PROCEDURE:  Procedure(s): COLONOSCOPY  SURGEON:  Surgeon(s): Missy Sabins, MD  Please see official procedure report.  Scope was advanced to about a meter where the lumen narrowed down with white exudate progressively severe inflammation friability and some gray submucosal hemorrhages in the terminal area of the Hartmann's pouch. There is also a lot of old blood in the distal colon and this raised the question of a second source of bleeding since inflamed area did not appear to be bleeding. However I cannot identify one after vigorous lavage. I saw no diverticuli. Finally there was a third linear ulcer or erosion with some overlying well clot in the rectum that I doubt was a major source of bleeding.

## 2011-02-19 NOTE — Progress Notes (Signed)
Subjective:    Patient seen on HD.  Still having bloody output from rectum, hgb dropped again, getting transfused on HD.  GI to scope, appreciate their assist. Low grade temps continue.  Duplex was negative for UE DVT  Objective Vital signs in last 24 hours: Filed Vitals:   02/19/11 0941 02/19/11 1000 02/19/11 1015 02/19/11 1039  BP: 121/73 122/77 105/41 131/76  Pulse: 120 118 119 114  Temp: 98.7 F (37.1 C) 97.6 F (36.4 C) 97.4 F (36.3 C) 97.8 F (36.6 C)  TempSrc: Oral Oral    Resp: 16 15    Height:      Weight:      SpO2:       Weight change:   Intake/Output Summary (Last 24 hours) at 02/19/11 1048 Last data filed at 02/19/11 1015  Gross per 24 hour  Intake    350 ml  Output    300 ml  Net     50 ml   Labs: Basic Metabolic Panel:  Lab [id number] 0900 02/19/11 0414 02/18/11 1300 02/18/11 0411 02/13/11 0500  NA 138 137 137 -- --  K 4.3 4.2 4.1 -- --  CL 101 101 100 -- --  CO2 21 24 24  -- --  GLUCOSE 67* 72 84 -- --  BUN 33* 31* 27* -- --  CREATININE 6.46* 6.06* 5.14* -- --  CALCIUM 6.8* 6.6* 6.9* -- --  ALB -- -- -- -- --  PHOS 4.3 -- -- 2.7 3.9   CBC:  Lab 02/19/11 0414 02/18/11 1845 02/18/11 1300 02/18/11 0411 02/16/11 1730 02/16/11 0500  WBC 14.7* -- 15.1* 14.5* -- --  NEUTROABS -- -- -- 10.9* 12.3* 9.6*  HGB 7.0* 8.2* 9.7* -- -- --  HCT 21.4* 25.3* 29.0* -- -- --  MCV 89.2 -- 88.7 88.3 88.8 85.7  PLT 379 -- 482* 425* -- --   CBG:  Lab 02/17/11 1240 02/17/11 0753 02/16/11 0728 02/15/11 2154 02/15/11 1657  GLUCAP 92 107* 118* 110* 160*   Medications:      . calcium carbonate  400 mg of elemental calcium Oral TID WC  . darbepoetin (ARANESP) injection - DIALYSIS  200 mcg Intravenous Q Tue-HD  . feeding supplement (NEPRO CARB STEADY)  237 mL Oral TID WC  . ferric glucontate (NULECIT) IV  125 mg Intravenous Q T,Th,Sa-HD  . fluconazole (DIFLUCAN) IV  200 mg Intravenous Q48H  . morphine   Intravenous Q4H  . multivitamin  1 tablet Oral Daily  .  pantoprazole (PROTONIX) IV  40 mg Intravenous Q12H  . piperacillin-tazobactam (ZOSYN)  IV  2.25 g Intravenous Q8H  . sodium chloride      . sodium chloride      . sodium chloride      . vancomycin  750 mg Intravenous Q T,Th,Sa-HD  . DISCONTD: vancomycin  1,000 mg Intravenous Once    I  have reviewed scheduled medications.  Physical Exam:  General: resting comfortably Heart: tachy regular - approx 100 Lungs: CTA  Abdomen: soft + BS w/colostomy  Extremities: tr LE edema; left arm noted to be swollen; no acute tenderness or erythema; duplex neg Dialysis Access:right  AVGG patent  Problem/Plan:  1. S/p hemicolectomy for ischemic bowel - keep Systolic BP > 123XX123 on HD; adequate pain control on PCA, will transition to PO pain meds.  2,. Left arm swelling - she noted after abdominal surgery.  doppler was neg 2. ESRD - TTS ;no heparin via AVG 3. Anemia - max ESA  And  iron. Transfuse as needed  4. Secondary hyperparathyroidism -adjusted Ca ok on Tums, phos is good 5. HTN/volume - Gently lower volume. 6. Nutrition - albumin very low; increase protein in diet; on nepro TID  7. ID - Vanc, Zosyn and diflucan still low grade temps, not sure of end points of antibiotics, maybe 14 days.  Fever curve and WBC do seem to be improving. 8. Disp - planning back to rehab when stable.  Keep in SDU until GIB resolved 9.  GIB- monitor hgb, transfuse as needed, GI to scope   Nicklas Mcsweeney A

## 2011-02-19 NOTE — Consult Note (Signed)
Ostomy follow-up:  Stoma red and viable. Pouch leaking on back of wafer. No family members have ever been present for any teaching sessions.  Demonstrated pouch application.  Mod green, watery stool.  Pt states her daughter and best friend are both familiar with pouching routines from previous experiences with others.  Recommend home health assistance after discharge. Supplies at bedside for staff use.  Julien Girt, RN, MSN, [employer]  [phone]

## 2011-02-19 NOTE — Progress Notes (Signed)
  Subjective: No further blood per rectum overnight, no abdominal pain  Objective: Vital signs in last 24 hours: Temp:  [98.3 F (36.8 C)-100.2 F (37.9 C)] 98.9 F (37.2 C) (12/04 0357) Pulse Rate:  [98-135] 117  (12/04 0000) Resp:  [17-23] 18  (12/04 0000) BP: (119-143)/(68-77) 119/68 mmHg (12/04 0357) SpO2:  [96 %-100 %] 98 % (12/04 0000) Last BM Date: 02/13/11  Intake/Output from previous day: 12/03 0701 - 12/04 0700 In: -  Out: 300 [Stool:300] Intake/Output this shift:    General appearance: alert Resp: clear to auscultation bilaterally Cardio: mild tachy GI: incision CDI, soft, NT, ND, ileostomy with output  Lab Results:   Basename 02/19/11 0414 02/18/11 1845 02/18/11 1300  WBC 14.7* -- 15.1*  HGB 7.0* 8.2* --  HCT 21.4* 25.3* --  PLT 379 -- 482*   BMET  Basename 02/19/11 0414 02/18/11 1300  NA 137 137  K 4.2 4.1  CL 101 100  CO2 24 24  GLUCOSE 72 84  BUN 31* 27*  CREATININE 6.06* 5.14*  CALCIUM 6.6* 6.9*   PT/INR  Basename 02/16/11 1730  LABPROT 16.4*  INR 1.30   ABG No results found for this basename: PHART:2,PCO2:2,PO2:2,HCO3:2 in the last 72 hours  Studies/Results: No results found.  Anti-infectives: Anti-infectives     Start     Dose/Rate Route Frequency Ordered Stop   02/14/11 1200   vancomycin (VANCOCIN) 500 mg in sodium chloride 0.9 % 100 mL IVPB        500 mg 100 mL/hr over 60 Minutes Intravenous Every Hemodialysis 02/11/11 1122     02/13/11 1700   fluconazole (DIFLUCAN) IVPB 200 mg        200 mg 100 mL/hr over 60 Minutes Intravenous Every 48 hours 02/13/11 1642     02/12/11 1200   vancomycin (VANCOCIN) 500 mg in sodium chloride 0.9 % 100 mL IVPB  Status:  Discontinued        500 mg 100 mL/hr over 60 Minutes Intravenous Every T-Th-Sa (Hemodialysis) 02/09/11 2050 02/11/11 1120   02/11/11 1600   vancomycin (VANCOCIN) IVPB 1000 mg/200 mL premix        1,000 mg 200 mL/hr over 60 Minutes Intravenous  Once 02/11/11 1119     02/09/11 2200   piperacillin-tazobactam (ZOSYN) IVPB 2.25 g        2.25 g 100 mL/hr over 30 Minutes Intravenous 3 times per day 02/09/11 2050     02/09/11 2100   piperacillin-tazobactam (ZOSYN) IVPB 3.375 g  Status:  Discontinued        3.375 g 12.5 mL/hr over 240 Minutes Intravenous Every 8 hours 02/09/11 2034 02/09/11 2050          Assessment/Plan: POD #10 S/P R colectomy and ileostomy More blood per rectum yesterday - stopped overnight, will ask GI to do colonoscopy to see source - ischemia versus staple line versus other cause. This will help guide therapy if surgery is needed ABL anemia - transfuse 2u PRBC in HD today Keep NPO for GI work-up   LOS: 10 days    [name] E 02/19/2011

## 2011-02-19 NOTE — Progress Notes (Signed)
Post colonoscopy addendum:  Colonoscopy performed to the termination of the Hartman's pouch. There was proximal inflammation most pronounced at the blind termination becoming less pronounced distally but covering an estimated 30 or 40 cm. There is also a lot of old blood and blood clots primarily in the distal colon I could not tell the exact source of this for a large amount of blood. I cannot tell if there is still active bleeding going on because I could not suction away although clots. A lavage as much of the old blood and clots out through the rectum as I could I did not see any diverticuli. There was a small linear erosion or ulcer with overlying clot in the rectum which was photographed. I do not think is likely a source of major bleeding. I discussed these findings with the surgical PA. Overall did not appear that any of the tissue was necrotic although there was some dusky mucosa underneath the exudate it did very termination of the Hartman's pouch.

## 2011-02-19 NOTE — Progress Notes (Signed)
02/19/11  15:20 PT Note: Attempted to see pt several times today.  Pt initially in HD and now at endo.  Will follow-up with pt tomorrow.  02/19/2011 Cyndia Bent, PT, DPT [phone]

## 2011-02-19 NOTE — Progress Notes (Signed)
Patient was seen on dialysis and the procedure was supervised.  BFR 400  Via AVG BP is  143/84.   Patient appears to be tolerating treatment well  [name] A 02/19/2011

## 2011-02-19 NOTE — Progress Notes (Signed)
D/c PCA per MD order, wasted 11 mg morphine in sink. Malen Gauze, RN, BSN.

## 2011-02-19 NOTE — Progress Notes (Signed)
Filed Vitals:   02/19/11 0742 02/19/11 0812 02/19/11 0831 02/19/11 0840  BP: 124/67 124/77 122/70 116/77  Pulse: 113 112 107 113  Temp: 98.4 F (36.9 C) 97.9 F (36.6 C)    TempSrc: Oral Oral    Resp: 20 15 20 18   Height:      Weight:  61.4 kg (135 lb 5.8 oz)    SpO2: 98% 97%      CBC  Basename 02/19/11 0414 02/18/11 1845 02/18/11 1300  WBC 14.7* -- 15.1*  HGB 7.0* 8.2* --  HCT 21.4* 25.3* --  PLT 379 -- 482*   BMET  Basename 02/19/11 0414 02/18/11 1300  NA 137 137  K 4.2 4.1  CL 101 100  CO2 24 24  GLUCOSE 72 84  BUN 31* 27*  CREATININE 6.06* 5.14*  CALCIUM 6.6* 6.9*    Patient resting comfortably while undergoing dialysis. Apparently she had more bleeding per rectum yesterday and her hemoglobin is now 7.0 and hematocrit is 21.4. Patient denies complaints at this time other than for some soreness in the lower right side of her abdomen.  On exam we still find weakness of the dorsiflexors bilaterally.  She does continue on a PCA morphine   Plan: Continue PT and OT and transfer to inpatient rehabilitation when medically stable. I do wonder whether she could be transition from the PCA morphine to oral analgesics. Medical and general surgical management defer to the nephrology service and general surgical service.

## 2011-02-20 ENCOUNTER — Encounter (HOSPITAL_COMMUNITY): Payer: Self-pay | Admitting: Gastroenterology

## 2011-02-20 LAB — TYPE AND SCREEN: Antibody Screen: NEGATIVE

## 2011-02-20 LAB — BASIC METABOLIC PANEL
BUN: 16 mg/dL (ref 6–23)
Chloride: 99 mEq/L (ref 96–112)
GFR calc Af Amer: 13 mL/min — ABNORMAL LOW (ref >90)
GFR calc non Af Amer: 12 mL/min — ABNORMAL LOW (ref >90)
Potassium: 3.8 mEq/L (ref 3.5–5.1)
Sodium: 136 mEq/L (ref 135–145)

## 2011-02-20 LAB — CBC
HCT: 27.3 % — ABNORMAL LOW (ref 36.0–46.0)
MCHC: 33.3 g/dL (ref 30.0–36.0)
RDW: 16.1 % — ABNORMAL HIGH (ref 11.5–15.5)
WBC: 15.7 10*3/uL — ABNORMAL HIGH (ref 4.0–10.5)

## 2011-02-20 MED ORDER — SODIUM CHLORIDE 0.9 % IJ SOLN
INTRAMUSCULAR | Status: AC
  Filled 2011-02-20: qty 10

## 2011-02-20 MED ORDER — SODIUM CHLORIDE 0.9 % IJ SOLN
INTRAMUSCULAR | Status: AC
  Administered 2011-02-20: 11:00:00
  Filled 2011-02-20: qty 10

## 2011-02-20 MED ORDER — SODIUM CHLORIDE 0.9 % IJ SOLN
INTRAMUSCULAR | Status: AC
  Administered 2011-02-20: 05:00:00
  Filled 2011-02-20: qty 10

## 2011-02-20 MED ORDER — OXYCODONE-ACETAMINOPHEN 5-325 MG PO TABS
2.0000 | ORAL_TABLET | ORAL | Status: DC | PRN
  Administered 2011-02-20 – 2011-02-25 (×22): 2 via ORAL
  Filled 2011-02-20 (×20): qty 2

## 2011-02-20 MED ORDER — SODIUM CHLORIDE 0.9 % IJ SOLN
INTRAMUSCULAR | Status: AC
  Administered 2011-02-20: 10 mL
  Filled 2011-02-20: qty 10

## 2011-02-20 MED ORDER — SODIUM CHLORIDE 0.9 % IJ SOLN
INTRAMUSCULAR | Status: AC
  Administered 2011-02-20: 05:00:00
  Filled 2011-02-20: qty 20

## 2011-02-20 NOTE — Progress Notes (Signed)
Physical Therapy Treatment Patient Details Name: [name] MRN: [id number] DOB: [birth year] Today's Date: 02/20/2011  PT Assessment/Plan  PT - Assessment/Plan Comments on Treatment Session: Still limited by bilateral LE weakness.  However, pt is motivated and progressing. PT Plan: Discharge plan remains appropriate;Frequency needs to be updated PT Frequency: Min 3X/week Follow Up Recommendations: Inpatient Rehab Equipment Recommended: Defer to next venue PT Goals  Acute Rehab PT Goals PT Goal Formulation: With patient Time For Goal Achievement: 2 weeks Pt will Roll Supine to Left Side: with supervision PT Goal: Rolling Supine to Left Side - Progress: Progressing toward goal Pt will go Sit to Supine/Side: with supervision;with HOB 0 degrees PT Goal: Sit to Supine/Side - Progress: Progressing toward goal Pt will go Sit to Stand: with supervision PT Goal: Sit to Stand - Progress: Progressing toward goal Pt will go Stand to Sit: with supervision PT Goal: Stand to Sit - Progress: Progressing toward goal Pt will Ambulate: 51 - 150 feet;with supervision;with least restrictive assistive device PT Goal: Ambulate - Progress: Progressing toward goal Pt will Go Up / Down Stairs: 1-2 stairs;with min assist;with least restrictive assistive device PT Goal: Up/Down Stairs - Progress: Progressing toward goal  PT Treatment Precautions/Restrictions  Precautions Precautions: Back Precaution Booklet Issued: No (Back handout given previously.) Precaution Comments: Pt able to recall 3/3 back precautions and verbalize donning/doffing of brace. Required Braces or Orthoses: Yes Spinal Brace: Lumbar corset;Applied in standing position Other Brace/Splint: Aspen corset Restrictions Weight Bearing Restrictions: No Pain 8/10 pain in back with treatment.  Pt repositioned and premedicated.  RN aware. Mobility (including Balance) Bed Mobility Bed Mobility: No Rolling Right: Not tested  (comment) Rolling Left: Not tested (comment) Left Sidelying to Sit: Not tested (comment) Transfers Transfers: Yes Sit to Stand: 1: +2 Total assist;Patient percentage (comment);From chair/3-in-1 (+2 total assist (pt=60%); 2 trials.) Sit to Stand Details (indicate cue type and reason): Assist for balance with cues for hand placement on chair.  Assist to translate trunk anterior over BOS. Stand to Sit: 1: +2 Total assist;Patient percentage (comment);To chair/3-in-1 (+2 total assist (pt=40%); 2 trials.) Stand to Sit Details: Assist to control eccentric descent to chair with cues for hand placement on armrests to assist.   Squat Pivot Transfers: Not tested (comment) Ambulation/Gait Ambulation/Gait: Yes Ambulation/Gait Assistance: 1: +2 Total assist;Patient percentage (comment) (+2 total assist (pt=50%)) Ambulation/Gait Assistance Details (indicate cue type and reason): Assist for balance with cues to extend bilateral knees and hips to increase posture.  Facilitation at ischials to extend.  Cues for sequence as well.  Chair follow for fatigue.  Pt with decreased ability to have smooth swing with bilateral LEs and is "throwing" legs forward to advance. Ambulation Distance (Feet): 46 Feet (2 trials (20 feet and then 26 feet)) Assistive device: Rolling walker Gait Pattern: Decreased dorsiflexion - right;Decreased dorsiflexion - left;Right foot flat;Left foot flat;Right genu recurvatum;Left genu recurvatum;Trunk flexed Stairs: No Wheelchair Mobility Wheelchair Mobility: No Wheelchair Assistance: Not tested (comment)  Posture/Postural Control Posture/Postural Control: No significant limitations Balance Balance Assessed: No Static Sitting Balance Static Sitting - Level of Assistance: Not tested (comment) Exercise    End of Session PT - End of Session Equipment Utilized During Treatment: Gait belt;Back brace Activity Tolerance: Patient limited by fatigue Patient left: in chair;with call bell in  reach Nurse Communication: Mobility status for transfers;Mobility status for ambulation General Behavior During Session: [facility] for tasks performed Cognition: [facility] for tasks performed  Cyndia Bent 02/20/2011, 9:24 AM  02/20/2011 Cyndia Bent, PT,  DPT [phone]

## 2011-02-20 NOTE — Progress Notes (Signed)
Subjective:  Patient sitting up in chair and room. She denies complaints at this time. Continuing PT and OT.  Objective: Vital signs in last 24 hours: Filed Vitals:   02/20/11 0000 02/20/11 0324 02/20/11 0400 02/20/11 0530  BP:  107/62    Pulse: 97 100 99   Temp:  97.9 F (36.6 C)    TempSrc:  Oral    Resp: 8 15 17    Height:      Weight:    63.6 kg (140 lb 3.4 oz)  SpO2: 95% 96% 98%     Intake/Output from previous day: 12/04 0701 - 12/05 0700 In: 1080 [P.O.:360; I.V.:20; Blood:700] Out: 1865 [Stool:475] Intake/Output this shift:    Physical Exam:  Motor function in lower extremities without change.  CBC  Basename 02/20/11 0500 02/19/11 1636 02/19/11 0414  WBC 15.7* -- 14.7*  HGB 9.1* 10.0* --  HCT 27.3* 29.4* --  PLT 365 -- 379   BMET  Basename 02/20/11 0500 02/19/11 0900  NA 136 138  K 3.8 4.3  CL 99 101  CO2 26 21  GLUCOSE 68* 67*  BUN 16 33*  CREATININE 3.88* 6.46*  CALCIUM 7.2* 6.8*    Assessment/Plan: Patient stable for neurosurgical perspective, continue PT and OT, transferred to inpatient rehabilitation when stable from nephrology and general surgical prospective.   Hosie Spangle, MD 02/20/2011, 7:17 AM

## 2011-02-20 NOTE — Progress Notes (Signed)
1 Day Post-Op  Subjective: No blood PR Objective: Vital signs in last 24 hours: Temp:  [97.4 F (36.3 C)-99.4 F (37.4 C)] 98.1 F (36.7 C) (12/05 0800) Pulse Rate:  [97-121] 101  (12/05 0800) Resp:  [8-37] 21  (12/05 0800) BP: (100-167)/(41-116) 116/66 mmHg (12/05 0800) SpO2:  [95 %-100 %] 99 % (12/05 0800) FiO2 (%):  [3 %] 3 % (12/04 1515) Weight:  [60.7 kg (133 lb 13.1 oz)-63.6 kg (140 lb 3.4 oz)] 140 lb 3.4 oz (63.6 kg) (12/05 0530) Last BM Date: 02/19/11  Intake/Output from previous day: 12/04 0701 - 12/05 0700 In: 1080 [P.O.:360; I.V.:20; Blood:700] Out: 1865 [Stool:475] Intake/Output this shift:    General appearance: alert Resp: clear to auscultation bilaterally GI: soft, incision CDI, ostomy pink with good output  Lab Results:   Basename 02/20/11 0500 02/19/11 1636 02/19/11 0414  WBC 15.7* -- 14.7*  HGB 9.1* 10.0* --  HCT 27.3* 29.4* --  PLT 365 -- 379   BMET  Basename 02/20/11 0500 02/19/11 0900  NA 136 138  K 3.8 4.3  CL 99 101  CO2 26 21  GLUCOSE 68* 67*  BUN 16 33*  CREATININE 3.88* 6.46*  CALCIUM 7.2* 6.8*   PT/INR No results found for this basename: LABPROT:2,INR:2 in the last 72 hours ABG No results found for this basename: PHART:2,PCO2:2,PO2:2,HCO3:2 in the last 72 hours  Studies/Results: No results found.  Anti-infectives: Anti-infectives     Start     Dose/Rate Route Frequency Ordered Stop   02/19/11 1200   vancomycin (VANCOCIN) 750 mg in sodium chloride 0.9 % 150 mL IVPB        750 mg 150 mL/hr over 60 Minutes Intravenous Every T-Th-Sa (Hemodialysis) 02/19/11 0836     02/14/11 1200   vancomycin (VANCOCIN) 500 mg in sodium chloride 0.9 % 100 mL IVPB  Status:  Discontinued        500 mg 100 mL/hr over 60 Minutes Intravenous Every Hemodialysis 02/11/11 1122 02/19/11 0835   02/13/11 1700   fluconazole (DIFLUCAN) IVPB 200 mg        200 mg 100 mL/hr over 60 Minutes Intravenous Every 48 hours 02/13/11 1642     02/12/11 1200    vancomycin (VANCOCIN) 500 mg in sodium chloride 0.9 % 100 mL IVPB  Status:  Discontinued        500 mg 100 mL/hr over 60 Minutes Intravenous Every T-Th-Sa (Hemodialysis) 02/09/11 2050 02/11/11 1120   02/11/11 1600   vancomycin (VANCOCIN) IVPB 1000 mg/200 mL premix  Status:  Discontinued        1,000 mg 200 mL/hr over 60 Minutes Intravenous  Once 02/11/11 1119 02/19/11 0835   02/09/11 2200   piperacillin-tazobactam (ZOSYN) IVPB 2.25 g        2.25 g 100 mL/hr over 30 Minutes Intravenous 3 times per day 02/09/11 2050     02/09/11 2100   piperacillin-tazobactam (ZOSYN) IVPB 3.375 g  Status:  Discontinued        3.375 g 12.5 mL/hr over 240 Minutes Intravenous Every 8 hours 02/09/11 2034 02/09/11 2050          Assessment/Plan: s/p Procedure(s): COLONOSCOPY POD #11 S/P R colectomy and ileostomy Colonoscopy results noted - some inflammation but no active bleeding, hold-off on further surgery D/C staples tomorrow ABL anemia ESRD-HD per renal  LOS: 11 days    Wiliam Cauthorn E 02/20/2011

## 2011-02-20 NOTE — Progress Notes (Signed)
Subjective: Pt tolerating CLs, States the pain is a little better. No blood per rectum.  Objective: Vital signs in last 24 hours: Temp:  [97.4 F (36.3 C)-99.4 F (37.4 C)] 98.1 F (36.7 C) (12/05 0800) Pulse Rate:  [97-121] 101  (12/05 0800) Resp:  [8-37] 21  (12/05 0800) BP: (100-167)/(41-116) 116/66 mmHg (12/05 0800) SpO2:  [95 %-100 %] 99 % (12/05 0800) FiO2 (%):  [3 %] 3 % (12/04 1515) Weight:  [60.7 kg (133 lb 13.1 oz)-63.6 kg (140 lb 3.4 oz)] 140 lb 3.4 oz (63.6 kg) (12/05 0530) Weight change:  Last BM Date: 02/19/11  Intake/Output from previous day: 12/04 0701 - 12/05 0700 In: 1080 [P.O.:360; I.V.:20; Blood:700] Out: 1865 [Stool:475] Intake/Output this shift:    PE:Gen: eating breakfast no distress Abd-nondistended, less tender BSs present   Lab Results: CBC    Component Value Date/Time   WBC 15.7* 02/20/2011 0500   RBC 3.16* 02/20/2011 0500   HGB 9.1* 02/20/2011 0500   HCT 27.3* 02/20/2011 0500   PLT 365 02/20/2011 0500   MCV 86.4 02/20/2011 0500   MCH 28.8 02/20/2011 0500   MCHC 33.3 02/20/2011 0500   RDW 16.1* 02/20/2011 0500   LYMPHSABS 1.2 02/18/2011 0411   MONOABS 2.0* 02/18/2011 0411   EOSABS 0.3 02/18/2011 0411   BASOSABS 0.1 02/18/2011 0411    BMET    Component Value Date/Time   NA 136 02/20/2011 0500   K 3.8 02/20/2011 0500   CL 99 02/20/2011 0500   CO2 26 02/20/2011 0500   GLUCOSE 68* 02/20/2011 0500   BUN 16 02/20/2011 0500   CREATININE 3.88* 02/20/2011 0500   CALCIUM 7.2* 02/20/2011 0500   GFRNONAA 12* 02/20/2011 0500   GFRAA 13* 02/20/2011 0500   liver function tests CMP     Component Value Date/Time   NA 136 02/20/2011 0500   K 3.8 02/20/2011 0500   CL 99 02/20/2011 0500   CO2 26 02/20/2011 0500   GLUCOSE 68* 02/20/2011 0500   BUN 16 02/20/2011 0500   CREATININE 3.88* 02/20/2011 0500   CALCIUM 7.2* 02/20/2011 0500   PROT 6.0 02/14/2011 0420   ALBUMIN 1.4* 02/19/2011 0900   AST 22 02/14/2011 0420   ALT 10 02/14/2011 0420   ALKPHOS 146* 02/14/2011 0420     BILITOT 0.4 02/14/2011 0420   GFRNONAA 12* 02/20/2011 0500   GFRAA 13* 02/20/2011 0500     Studies/Results: Colon shows inflammation  and old blood near pouch but no active bleeding and no clear necrosis. Medications: I have reviewed the patient's current medications.  Assessment/Plan: 1. GI bleeding s/p rt colectomy for ischemic colitis.  May have some ischemic changes on colonoscopy but doesn't  Appear to be necrotic and she seems to be feeling better.  Tolerating CLs.  Would continue supportive care now.  Don't feel she needs reoperation at this time. Will follow.   Reda Gettis JR,Graiden Henes L 02/20/2011, 8:40 AM

## 2011-02-20 NOTE — Progress Notes (Addendum)
S:c/o pain in back & stomach O:BP 116/66  Pulse 101  Temp(Src) 98.1 F (36.7 C) (Oral)  Resp 21  Ht 5\' 5"  (1.651 m)  Wt 63.6 kg (140 lb 3.4 oz)  BMI 23.33 kg/m2  SpO2 99%  Intake/Output Summary (Last 24 hours) at 02/20/11 1056 Last data filed at 02/20/11 0513  Gross per 24 hour  Intake    730 ml  Output   1565 ml  Net   -835 ml   Weight change:  [id number] CVS:RRR, gr3/ honking SEM Resp:clear [id number] opp colostomy & midline staples Ext:2-3+ edema    . calcium carbonate  400 mg of elemental calcium Oral TID WC  . darbepoetin (ARANESP) injection - DIALYSIS  200 mcg Intravenous Q Tue-HD  . feeding supplement (NEPRO CARB STEADY)  237 mL Oral TID WC  . ferric glucontate (NULECIT) IV  125 mg Intravenous Q T,Th,Sa-HD  . fluconazole (DIFLUCAN) IV  200 mg Intravenous Q48H  . multivitamin  1 tablet Oral Daily  . pantoprazole (PROTONIX) IV  40 mg Intravenous Q12H  . piperacillin-tazobactam (ZOSYN)  IV  2.25 g Intravenous Q8H  . sodium chloride      . sodium chloride      . vancomycin  750 mg Intravenous Q T,Th,Sa-HD  . DISCONTD: morphine   Intravenous Q4H   No results found. BMET  Lab 02/20/11 0500 02/19/11 0900 02/19/11 0414 02/18/11 1300 02/18/11 0411 02/16/11 0500 02/15/11 1115  NA 136 138 137 137 135 136 140  K 3.8 4.3 4.2 4.1 4.0 3.5 3.4*  CL 99 101 101 100 98 100 100  CO2 26 21 24 24 26 27 25   GLUCOSE 68* 67* 72 84 111* 95 71  BUN 16 33* 31* 27* 23 26* 19  CREATININE 3.88* 6.46* 6.06* 5.14* 4.59* 4.62* 3.69*  ALB -- -- -- -- -- -- --  CALCIUM 7.2* 6.8* 6.6* 6.9* 6.9* 6.8* 7.2*  PHOS -- 4.3 -- -- 2.7 -- --   CBC  Lab 02/20/11 0500 02/19/11 1636 02/19/11 0414 02/18/11 1845 02/18/11 1300 02/18/11 0411 02/16/11 1730 02/16/11 0500 02/14/11 0420  WBC 15.7* -- 14.7* -- 15.1* 14.5* -- -- --  NEUTROABS -- -- -- -- -- 10.9* 12.3* 9.6* 13.9*  HGB 9.1* 10.0* 7.0* 8.2* -- -- -- -- --  HCT 27.3* 29.4* 21.4* 25.3* -- -- -- -- --  MCV 86.4 -- 89.2 -- 88.7 88.3 -- -- --  PLT  365 -- 379 -- 482* 425* -- -- --     Assessment/Plan: 1. S/p hemicolectomy for ischemic bowel - keep Systolic BP > 123XX123 on HD;inadequate pain control will increase PO pain meds.  2,. Left arm swelling - she noted after abdominal surgery. doppler was neg  2. ESRD - TTS ;no heparin via AVG  3. Anemia - max ESA And iron. Transfuse as needed  4. Secondary hyperparathyroidism -adjusted Ca ok on Tums, phos is good  5. HTN/volume - overloaded..Will increase time on HD to 4hrs for vol xs 6. Nutrition - albumin very low; increase protein in diet; on nepro TID  7. ID - Vanc, Zosyn and diflucan still low grade temps, not sure of end points of antibiotics, maybe 14 days. Fever curve and WBC do seem to be improving.  8. GIB-slow ooze persists: monitor hgb, transfuse as needed, s/p nondiagnostic colo-sigmoidoscopy . Disp - planning back to rehab when stable. Keep in SDU until GIB resolved   Alyanna Stoermer C

## 2011-02-21 ENCOUNTER — Inpatient Hospital Stay (HOSPITAL_COMMUNITY): Payer: Medicare Other

## 2011-02-21 LAB — RENAL FUNCTION PANEL
BUN: 21 mg/dL (ref 6–23)
CO2: 24 mEq/L (ref 19–32)
Calcium: 7 mg/dL — ABNORMAL LOW (ref 8.4–10.5)
Creatinine, Ser: 5.44 mg/dL — ABNORMAL HIGH (ref 0.50–1.10)
GFR calc non Af Amer: 8 mL/min — ABNORMAL LOW (ref >90)
Glucose, Bld: 92 mg/dL (ref 70–99)

## 2011-02-21 LAB — CBC
HCT: 28.1 % — ABNORMAL LOW (ref 36.0–46.0)
Hemoglobin: 9.3 g/dL — ABNORMAL LOW (ref 12.0–15.0)
MCH: 29 pg (ref 26.0–34.0)
MCHC: 33.1 g/dL (ref 30.0–36.0)
MCV: 87.5 fL (ref 78.0–100.0)
RBC: 3.21 MIL/uL — ABNORMAL LOW (ref 3.87–5.11)

## 2011-02-21 MED ORDER — OXYCODONE-ACETAMINOPHEN 5-325 MG PO TABS
ORAL_TABLET | ORAL | Status: AC
  Filled 2011-02-21: qty 2

## 2011-02-21 MED ORDER — ALBUMIN HUMAN 25 % IV SOLN
12.5000 g | Freq: Once | INTRAVENOUS | Status: AC
  Administered 2011-02-21: 12.5 g via INTRAVENOUS
  Filled 2011-02-21: qty 50

## 2011-02-21 MED ORDER — ALBUMIN HUMAN 25 % IV SOLN
INTRAVENOUS | Status: AC
  Filled 2011-02-21: qty 50

## 2011-02-21 NOTE — Progress Notes (Signed)
2 Days Post-Op  Subjective: No blood per rectum  Objective: Vital signs in last 24 hours: Temp:  [97.6 F (36.4 C)-98.1 F (36.7 C)] 98.1 F (36.7 C) (12/06 0800) Pulse Rate:  [101-105] 102  (12/06 0800) Resp:  [16-25] 17  (12/06 0800) BP: (111-117)/(63-80) 112/66 mmHg (12/06 0800) SpO2:  [98 %-99 %] 98 % (12/06 0800) Weight:  [61.4 kg (135 lb 5.8 oz)] 135 lb 5.8 oz (61.4 kg) (12/06 0310) Last BM Date: 02/19/11  Intake/Output from previous day: 12/05 0701 - 12/06 0700 In: 250 [P.O.:240; I.V.:10] Out: 250 [Stool:250] Intake/Output this shift:    General appearance: alert Resp: clear to auscultation bilaterally Cardio: regular rate and rhythm GI: Soft, incision CDI, Ileostomy viable with output  Lab Results:   Basename 02/20/11 0500 02/19/11 1636 02/19/11 0414  WBC 15.7* -- 14.7*  HGB 9.1* 10.0* --  HCT 27.3* 29.4* --  PLT 365 -- 379   BMET  Basename 02/20/11 0500 02/19/11 0900  NA 136 138  K 3.8 4.3  CL 99 101  CO2 26 21  GLUCOSE 68* 67*  BUN 16 33*  CREATININE 3.88* 6.46*  CALCIUM 7.2* 6.8*   PT/INR No results found for this basename: LABPROT:2,INR:2 in the last 72 hours ABG No results found for this basename: PHART:2,PCO2:2,PO2:2,HCO3:2 in the last 72 hours  Studies/Results: No results found.  Anti-infectives: Anti-infectives     Start     Dose/Rate Route Frequency Ordered Stop   02/19/11 1200   vancomycin (VANCOCIN) 750 mg in sodium chloride 0.9 % 150 mL IVPB        750 mg 150 mL/hr over 60 Minutes Intravenous Every T-Th-Sa (Hemodialysis) 02/19/11 0836     02/14/11 1200   vancomycin (VANCOCIN) 500 mg in sodium chloride 0.9 % 100 mL IVPB  Status:  Discontinued        500 mg 100 mL/hr over 60 Minutes Intravenous Every Hemodialysis 02/11/11 1122 02/19/11 0835   02/13/11 1700   fluconazole (DIFLUCAN) IVPB 200 mg        200 mg 100 mL/hr over 60 Minutes Intravenous Every 48 hours 02/13/11 1642     02/12/11 1200   vancomycin (VANCOCIN) 500 mg in  sodium chloride 0.9 % 100 mL IVPB  Status:  Discontinued        500 mg 100 mL/hr over 60 Minutes Intravenous Every T-Th-Sa (Hemodialysis) 02/09/11 2050 02/11/11 1120   02/11/11 1600   vancomycin (VANCOCIN) IVPB 1000 mg/200 mL premix  Status:  Discontinued        1,000 mg 200 mL/hr over 60 Minutes Intravenous  Once 02/11/11 1119 02/19/11 0835   02/09/11 2200   piperacillin-tazobactam (ZOSYN) IVPB 2.25 g        2.25 g 100 mL/hr over 30 Minutes Intravenous 3 times per day 02/09/11 2050     02/09/11 2100   piperacillin-tazobactam (ZOSYN) IVPB 3.375 g  Status:  Discontinued        3.375 g 12.5 mL/hr over 240 Minutes Intravenous Every 8 hours 02/09/11 2034 02/09/11 2050          Assessment/Plan: s/p Procedure(s): COLONOSCOPY POD #12 S/P R colectomy and ileostomy No further blood per rectum D/C staples  ADV diet ABL anemia ESRD-HD per renal  LOS: 12 days    [name] 02/21/2011

## 2011-02-21 NOTE — Progress Notes (Signed)
This patient was discussed at long LOS rounds 12.05.12

## 2011-02-21 NOTE — Progress Notes (Signed)
Subjective:  Patient resting comfortably in bed denies complaints regarding low back. Continuing PT and OT.  Objective: Vital signs in last 24 hours: Filed Vitals:   02/20/11 2308 02/21/11 0310 02/21/11 0311 02/21/11 0800  BP: 117/70 117/67 117/67 112/66  Pulse: 104 102  102  Temp: 98 F (36.7 C) 98.1 F (36.7 C)  98.1 F (36.7 C)  TempSrc: Oral Oral  Oral  Resp: 25 17 21 17   Height:      Weight:  61.4 kg (135 lb 5.8 oz)    SpO2: 98% 98%  98%    Intake/Output from previous day: 12/05 0701 - 12/06 0700 In: 250 [P.O.:240; I.V.:10] Out: 250 [Stool:250] Intake/Output this shift:    Physical Exam:  Lumbar incision is well-healed there is no erythema swelling or drainage. Motor function lower Charlie's again shows bilateral weakness of the dorsiflexion.  CBC  Basename 02/20/11 0500 02/19/11 1636 02/19/11 0414  WBC 15.7* -- 14.7*  HGB 9.1* 10.0* --  HCT 27.3* 29.4* --  PLT 365 -- 379   BMET  Basename 02/20/11 0500 02/19/11 0900  NA 136 138  K 3.8 4.3  CL 99 101  CO2 26 21  GLUCOSE 68* 67*  BUN 16 33*  CREATININE 3.88* 6.46*  CALCIUM 7.2* 6.8*    Studies/Results: No results found.  Assessment/Plan: Continue PT and OT, transfer to inpatient rehabilitation when stable from nephrology and general surgical perspective.   Hosie Spangle, MD 02/21/2011, 10:21 AM

## 2011-02-21 NOTE — Progress Notes (Signed)
S:Did not get hemodialysis yesterday due to EPIC error.  Feels full. No bleeding O:BP 117/67  Pulse 102  Temp(Src) 98.1 F (36.7 C) (Oral)  Resp 21  Ht 5\' 5"  (1.651 m)  Wt 61.4 kg (135 lb 5.8 oz)  BMI 22.53 kg/m2  SpO2 98%  Intake/Output Summary (Last 24 hours) at 02/21/11 0744 Last data filed at 02/20/11 2218  Gross per 24 hour  Intake    250 ml  Output    250 ml  Net      0 ml   Weight change: 0 kg (0 lb) [id number] anasarca CVS:RRR Resp:clear [id number] Ext:2+ edema bilaterally    . calcium carbonate  400 mg of elemental calcium Oral TID WC  . darbepoetin (ARANESP) injection - DIALYSIS  200 mcg Intravenous Q Tue-HD  . feeding supplement (NEPRO CARB STEADY)  237 mL Oral TID WC  . ferric glucontate (NULECIT) IV  125 mg Intravenous Q T,Th,Sa-HD  . fluconazole (DIFLUCAN) IV  200 mg Intravenous Q48H  . multivitamin  1 tablet Oral Daily  . pantoprazole (PROTONIX) IV  40 mg Intravenous Q12H  . piperacillin-tazobactam (ZOSYN)  IV  2.25 g Intravenous Q8H  . sodium chloride      . sodium chloride      . sodium chloride      . vancomycin  750 mg Intravenous Q T,Th,Sa-HD   No results found. BMET  Lab 02/20/11 0500 02/19/11 0900 02/19/11 0414 02/18/11 1300 02/18/11 0411 02/16/11 0500 02/15/11 1115  NA 136 138 137 137 135 136 140  K 3.8 4.3 4.2 4.1 4.0 3.5 3.4*  CL 99 101 101 100 98 100 100  CO2 26 21 24 24 26 27 25   GLUCOSE 68* 67* 72 84 111* 95 71  BUN 16 33* 31* 27* 23 26* 19  CREATININE 3.88* 6.46* 6.06* 5.14* 4.59* 4.62* 3.69*  ALB -- -- -- -- -- -- --  CALCIUM 7.2* 6.8* 6.6* 6.9* 6.9* 6.8* 7.2*  PHOS -- 4.3 -- -- 2.7 -- --   CBC  Lab 02/20/11 0500 02/19/11 1636 02/19/11 0414 02/18/11 1845 02/18/11 1300 02/18/11 0411 02/16/11 1730 02/16/11 0500  WBC 15.7* -- 14.7* -- 15.1* 14.5* -- --  NEUTROABS -- -- -- -- -- 10.9* 12.3* 9.6*  HGB 9.1* 10.0* 7.0* 8.2* -- -- -- --  HCT 27.3* 29.4* 21.4* 25.3* -- -- -- --  MCV 86.4 -- 89.2 -- 88.7 88.3 -- --  PLT 365 -- 379 --  482* 425* -- --   Assessment/Plan: 1. S/p hemicolectomy for ischemic bowel -  2,. Left arm swelling -. doppler was neg  2. ESRD - TTS ;no heparin via AVG  3. Anemia - max ESA And iron. Transfuse as needed  4. Secondary hyperparathyroidism - 5. HTN/volume - overloaded..Will increase time on HD to 4hrs for vol xs.  May need serial HD 6. Nutrition - albumin very low; increase protein in diet; on nepro TID  7. ID - Vanc, Zosyn and diflucan still low grade temps, not sure of end points of antibiotics, maybe 14 days. Fever curve and WBC do seem to be improving.  8. GIB-slow ooze persists: monitor hgb, transfuse as needed, s/p nondiagnostic colo-sigmoidoscopy . Disp - planning back to rehab when stable. Transfer to [place]

## 2011-02-21 NOTE — Progress Notes (Signed)
Occupational Therapy Evaluation Patient Details Name: [name] MRN: [id number] DOB: 04-Aug-1950 Today's Date: 02/21/2011  OT Assessment/Plan OT Assessment/Plan Comments on Treatment Session: Pt. very motivated.  Pt. progressing toward goals.  Limited by pain and fatigue. OT Plan: Discharge plan remains appropriate OT Frequency: Min 2X/week Follow Up Recommendations: Inpatient Rehab Equipment Recommended: Defer to next venue OT Goals ADL Goals ADL Goal: Grooming - Progress: Progressing toward goals ADL Goal: Toilet Transfer - Progress: Progressing toward goals  OT Treatment Precautions/Restrictions  Precautions Precautions: Back Precaution Booklet Issued: No Precaution Comments: Pt. recalled 3/3 back precautions with mod cues. Donned back brace with min A and min verbal cues Required Braces or Orthoses: Yes Spinal Brace: Lumbar corset Restrictions Weight Bearing Restrictions: No   ADL ADL Grooming: Performed;Teeth care;Minimal assistance Grooming Details (indicate cue type and reason): Pt. required mod cues for back precautions.  Pt. stands with hips flexed, but attempts repetatively attempts to prop on elbows Where Assessed - Grooming: Standing at sink Toilet Transfer: Simulated;+2 Total assistance Toilet Transfer Details (indicate cue type and reason): Pt. requires cues for upright posture and assist to stay inside walker Toilet Transfer Method: Ambulating Toilet Transfer Equipment: Comfort height toilet;Grab bars Equipment Used: Rolling walker ADL Comments: Pt. motivated to participate, but does demonstrate impaired memory/recall of info.  Instructions for activity provided prior to performing, then when attempting to assit pt with sit to stand she would state, "what are we doing?" Mobility  Bed Mobility Bed Mobility: Yes Rolling Right: 4: Min assist;With rail Rolling Right Details (indicate cue type and reason): see PT comments Rolling Left: 4: Min assist;With  rail Rolling Left Details (indicate cue type and reason): see PT notes for details Left Sidelying to Sit: 3: Mod assist Left Sidelying to Sit Details (indicate cue type and reason): see PT note for details Sit to Supine - Left: 3: Mod assist;HOB flat Sit to Supine - Left Details (indicate cue type and reason): see PT note for details Transfers Transfers: Yes Sit to Stand: 1: +2 Total assist;Patient percentage (comment);From bed;From chair/3-in-1;With upper extremity assist Sit to Stand Details (indicate cue type and reason): See PT note Stand to Sit: 1: +2 Total assist;Patient percentage (comment);To chair/3-in-1;To bed;With upper extremity assist Stand to Sit Details: see PT note Exercises    End of Session OT - End of Session Equipment Utilized During Treatment: Gait belt;Back brace Activity Tolerance: Patient limited by fatigue;Patient limited by pain Patient left: in bed;with call bell in reach Nurse Communication:  (request for pain meds) General Behavior During Session: [facility] for tasks performed Cognition: Impaired Cognitive Impairment: Pt. demonstrates impaired memory.    Lucille Passy M  02/21/2011, 3:51 PM

## 2011-02-21 NOTE — Progress Notes (Signed)
Physical Therapy Treatment Patient Details Name: [name] MRN: [id number] DOB: 05-Jan-1951 Today's Date: 02/21/2011  PT Assessment/Plan  PT - Assessment/Plan Comments on Treatment Session: Still limited by bilateral LE weakness.  However, pt is motivated and progressing. PT Plan: Discharge plan remains appropriate;Frequency remains appropriate PT Frequency: Min 3X/week Follow Up Recommendations: Inpatient Rehab Equipment Recommended: Defer to next venue PT Goals  Acute Rehab PT Goals PT Goal Formulation: With patient Time For Goal Achievement: 2 weeks Pt will Roll Supine to Left Side: with supervision PT Goal: Rolling Supine to Left Side - Progress: Progressing toward goal Pt will go Sit to Supine/Side: with supervision;with HOB 0 degrees PT Goal: Sit to Supine/Side - Progress: Progressing toward goal Pt will go Sit to Stand: with supervision PT Goal: Sit to Stand - Progress: Progressing toward goal Pt will go Stand to Sit: with supervision PT Goal: Stand to Sit - Progress: Progressing toward goal Pt will Ambulate: 51 - 150 feet;with supervision;with least restrictive assistive device PT Goal: Ambulate - Progress: Progressing toward goal Pt will Go Up / Down Stairs: 1-2 stairs;with min assist;with least restrictive assistive device PT Goal: Up/Down Stairs - Progress: Progressing toward goal  PT Treatment Precautions/Restrictions  Precautions Precautions: Back Precaution Booklet Issued: No (Back handout given previously.) Precaution Comments: Pt able to recall 3/3 back precautions and verbalize donning/doffing of brace as well as perform donning/doffing with min assist. Required Braces or Orthoses: Yes Spinal Brace: Lumbar corset;Applied in standing position Other Brace/Splint: Aspen corset Restrictions Weight Bearing Restrictions: No Pain 7/10 in back.  Pt repositioned.  RN notified and reports pt being premedicated before treatment. Mobility (including Balance) Bed  Mobility Bed Mobility: Yes Rolling Right: 4: Min assist;With rail Rolling Right Details (indicate cue type and reason): Assist to flex bilateral LEs and cue to reach across body to roll. Rolling Left: 4: Min assist;With rail Rolling Left Details (indicate cue type and reason): Assist to flex bilateral LEs and to reach across body toward rail.  Cues for sequence. Left Sidelying to Sit: 3: Mod assist Left Sidelying to Sit Details (indicate cue type and reason): Assist to bring bilateral LEs off of EOB with assist to translate trunk to midline.  Cues for sequence. Sit to Supine - Left: 3: Mod assist;HOB flat Sit to Supine - Left Details (indicate cue type and reason): Assist to bilateral LEs with cues for trunk to protect back. Transfers Transfers: Yes Sit to Stand: 1: +2 Total assist;Patient percentage (comment);From bed;From chair/3-in-1;With upper extremity assist (+2 total assist (pt=65%); 3 trials.) Sit to Stand Details (indicate cue type and reason): Assist to translate trunk anterior over BOS with cues for hand placement and safety.   Stand to Sit: 1: +2 Total assist;Patient percentage (comment);To chair/3-in-1;To bed;With upper extremity assist (+2 total assist (pt=50%); 3 trials.) Stand to Sit Details: Assist to control descent to chair with cues for safest hand placement and controlled descent. Squat Pivot Transfers: Not tested (comment) Ambulation/Gait Ambulation/Gait: Yes Ambulation/Gait Assistance: 1: +2 Total assist;Patient percentage (comment) (+2 total assist (pt=60%)) Ambulation/Gait Assistance Details (indicate cue type and reason): Assist for balance with cues to extend hips and trunk.  Pt with narrow BOS and increased hip/knee flexion especially with fatigue.  Chair follow due to fatigue.  Cues to attempt heel strike bilaterally with gait. Ambulation Distance (Feet): 50 Feet (40 feet and then 10 feet) Assistive device: Rolling walker Gait Pattern: Decreased dorsiflexion -  right;Decreased dorsiflexion - left;Right foot flat;Left foot flat;Right genu recurvatum;Left genu recurvatum;Trunk flexed Stairs:  No Wheelchair Mobility Wheelchair Mobility: No Wheelchair Assistance: Not tested (comment)  Posture/Postural Control Posture/Postural Control: No significant limitations Balance Balance Assessed: Yes Static Sitting Balance Static Sitting - Level of Assistance: Not tested (comment) Static Standing Balance Static Standing - Level of Assistance: 3: Mod assist (Assist for balance with cues to extend trunk.) Static Standing - Comment/# of Minutes: 5 Dynamic Standing Balance Dynamic Standing - Level of Assistance: Not tested (comment) Exercise    End of Session PT - End of Session Equipment Utilized During Treatment: Gait belt;Back brace Activity Tolerance: Patient limited by fatigue Patient left: in bed;with call bell in reach Nurse Communication: Mobility status for transfers;Mobility status for ambulation General Behavior During Session: [facility] for tasks performed Cognition: [facility] for tasks performed Cognitive Impairment: N/A Co-treatment with OT.  Briena Swingler M 02/21/2011, 2:25 PM  02/21/2011 Cyndia Bent, PT, DPT [phone]

## 2011-02-22 LAB — GLUCOSE, CAPILLARY
Glucose-Capillary: 67 mg/dL — ABNORMAL LOW (ref 70–99)
Glucose-Capillary: 128 mg/dL — ABNORMAL HIGH (ref 70–99)
Glucose-Capillary: 111 mg/dL — ABNORMAL HIGH (ref 70–99)

## 2011-02-22 MED ORDER — HEPARIN SODIUM (PORCINE) 1000 UNIT/ML DIALYSIS
20.0000 [IU]/kg | INTRAMUSCULAR | Status: DC | PRN
  Administered 2011-02-24: 1100 [IU] via INTRAVENOUS_CENTRAL
  Filled 2011-02-22: qty 2

## 2011-02-22 MED ORDER — PANTOPRAZOLE SODIUM 40 MG PO TBEC
40.0000 mg | DELAYED_RELEASE_TABLET | Freq: Two times a day (BID) | ORAL | Status: DC
  Administered 2011-02-22 – 2011-02-25 (×6): 40 mg via ORAL
  Filled 2011-02-22 (×5): qty 1

## 2011-02-22 NOTE — Consult Note (Signed)
WOC ostomy consult  Stoma type/location:  Ileostomy  Stomal assessment/size: 13/4 inches, red and viable, above skin level Peristomal assessment: Intact skin Output  150 cc dk green liquid stool Ostomy pouching: 1pc flexible pouch applied with daughter and husband participating at bedside.  Education provided:  Pt able to apply and remove clamp.  Daughter able to cut and apply pouch.  Discussed pouching routines and ordering supplies and dietary precautions. Supplies ordered to bedside for staff use.   Julien Girt, RN, MSN, [employer]  [phone]

## 2011-02-22 NOTE — PMR Pre-admission (Signed)
PMR Admission Coordinator Pre-Admission Assessment  Patient:  [name] is an 59 y.o., female MRN:  [id number] DOB:  11-08-1950 Height:  Height: 5\' 5"  (165.1 cm) Weight:  Weight: 56.4 kg (124 lb 5.4 oz)  Insurance Information: HMO:    PPO:     PCP:     IPA:     80/20:yes     OTHER: PRIMARY:Medicare a and b      Policy#:[id number]      Subscriber:pt Benefits:  Phone #: visionshare     Name:automated Eff. Date:a 10/16/89 and b 12/17/91     Deduct:$1156      Out of Pocket [id number]      Life [id number] CIR:100%      SNF:20 full days LBD 06/18/08 Outpatient: 80%     Co-Pay:20 % Home Health:100 %      Co-Pay:none DME:80%     Co-Pay:20% Providers:pt choice SECONDARY:BCBS      Policy#:[id number]      Subscriber:pt No  authorization required with primary medicare  Current Medical History:   Patient Admitting Diagnosis:  Deconditioning s/p right hemicolectomy secondary to ischemic colitis, lumbar stenosis and radiculopathy s/p decompression and fusion and post-operative foot drop  History of Present Illness: Patient originally admitted to [facility] 02/01/2011 for lumbar stenosis and radiculopathy s/p decompression and fusion. Developed abdominal pain issues. Taken to surgery 02/09/11 by Dr. Ninfa Linden and readmitted to acute care. Found to have ischemic colitis and underwent right hemicolectomy. An ileostomy for diversion was performed.  Patient continued with abdominal pain and G.I. Was consulted to evaluated for ongoing ischemia. C.T. of abdomen had revealed fluid collections and grossly normal small bowel edema of the colon mesentery with continued colonic distention. Colonoscopy performed 02/19/11 and revealed some inflammation but no active bleeding and plans to hold off on any further surgery. 02/21/11 advanced diet and tolerated well. Ongoing follow up with Dr. Rita Ohara for her lumbar surgery and felt incision well healed. Continues with bilateral weakness of the dorsiflexion.    Patient's Past  Medical History:   Past Medical History  Diagnosis Date  . Seizures     r/t HTN in 1990's x 1  . Brain aneurysm   . Blood transfusion 1990's    r/t Kidney removal surgery  . Arthritis     Back  . Anemia   . Umbilical hernia age 74  . Heart murmur     Born with heart mumur, does not require follow up per pt  . Hypertension     Does not see a heart doctor, had pre transplant stress test at [facility]    . Diabetes mellitus     Borderline  . End stage renal disease on dialysis     T/Th/Sat dialysis on [employer]  . End stage renal disease on dialysis     01-29-2011  . Hepatitis C    Family Medical History:    Prior Rehab/Hospitalizations: CIR 02/01/11 trough 02/09/11 and readmitted to acute care 02/09/11.  Medications Current Medications: Current facility-administered medications:alum & mag hydroxide-simeth (MAALOX PLUS) 400-400-40 MG/5ML suspension 15 mL, 15 mL, Oral, Q6H PRN, Grace Bushy Minor, NP, 15 mL at 02/15/11 1200;  calcium carbonate (TUMS - dosed in mg elemental calcium) chewable tablet 400 mg of elemental calcium, 400 mg of elemental calcium, Oral, TID WC, Lucrezia Starch, MD, 400 mg of elemental calcium at 02/25/11 0811 darbepoetin (ARANESP) injection 200 mcg, 200 mcg, Intravenous, Q Tue-HD, Lucrezia Starch, MD, 200 mcg at 02/19/11 [phone];  feeding supplement (  NEPRO CARB STEADY) liquid 237 mL, 237 mL, Oral, PRN, Lucrezia Starch, MD;  feeding supplement (NEPRO CARB STEADY) liquid 237 mL, 237 mL, Oral, TID WC, Alex Gardener Bruning, PA, 237 mL at 02/24/11 1800 feeding supplement (NEPRO CARB STEADY) liquid 237 mL, 237 mL, Oral, TID WC, Estevan Ryder, RD, 237 mL at 02/25/11 0807;  heparin injection 1,100 Units, 20 Units/kg, Dialysis, PRN, Estanislado Emms, MD, 1,100 Units at 02/24/11 0716;  lidocaine (XYLOCAINE) 1 % injection 5 mL, 5 mL, Intradermal, PRN, Lucrezia Starch, MD;  lidocaine-prilocaine (EMLA) cream 1 application, 1 application, Topical, PRN, Lucrezia Starch, MD megestrol  (MEGACE) 400 MG/10ML suspension 200 mg, 200 mg, Oral, BID, Luvenia Heller, FNP, 200 mg at 02/24/11 2145;  metoprolol (LOPRESSOR) injection 5 mg, 5 mg, Intravenous, Q6H PRN, Rakesh V. Elsworth Soho, MD, 2.5 mg at 02/13/11 1145;  multivitamin (RENA-VIT) tablet 1 tablet, 1 tablet, Oral, Daily, Myriam Jacobson, PA, 1 tablet at 02/24/11 1111;  ondansetron (ZOFRAN) tablet 4 mg, 4 mg, Oral, Q6H PRN, Harl Bowie, MD oxyCODONE-acetaminophen (PERCOCET) 5-325 MG per tablet 2 tablet, 2 tablet, Oral, Q4H PRN, Estanislado Emms, MD, 2 tablet at 02/25/11 [phone];  oxyCODONE-acetaminophen (PERCOCET) 5-325 MG per tablet, , , , ;  pantoprazole (PROTONIX) EC tablet 40 mg, 40 mg, Oral, BID AC, Estanislado Emms, MD, 40 mg at 02/25/11 [id number];  pentafluoroprop-tetrafluoroeth (GEBAUERS) aerosol 1 application, 1 application, Topical, PRN, Lucrezia Starch, MD  Precautions/Special Needs:    Additional Precautions/Restrictions: Precautions Precautions: Back Precaution Booklet Issued: No Precaution Comments: Pt. recalled 3/3 back precautions with mod cues. Donned back brace with min A and min verbal cues Required Braces or Orthoses: Yes Spinal Brace: Lumbar corset Other Brace/Splint: Aspen corset Restrictions Weight Bearing Restrictions: No Lumbar corset; Applied in standing position  Therapy Assessments   Cognition Arousal/Alertness: Awake/alert Overall Cognitive Status: Impaired Orientation Level: Oriented X4 Following Commands: Follows one step commands inconsistently;Follows multi-step commands inconsistently;Follows one step commands with increased time;Follows multi-step commands with increased time Safety/Judgement: Decreased awareness of safety precautions;Decreased safety judgement for tasks assessed Decreased Safety/Judgement: Decreased awareness of need for assistance Safety/Judgement - Other Comments: decreased cognition questionably secondary to medications 02/22/11 Patient alert and oriented with intact safety  awareness.  Home Living Lives With: Alone Receives Help From: Friend(s);Family Type of Home: House Home Layout: One level Home Access: Stairs to enter Entrance Stairs-Rails: Right Entrance Stairs-Number of Steps: 2 Bathroom Shower/Tub: Chiropodist: Standard Home Adaptive Equipment: Wheelchair - manual;Walker - rolling;Bedside commode/3-in-1 Additional Comments: Question whether patient is accurate historian given meds   Sensation Light Touch: Impaired Detail Light Touch Impaired Details: Absent RLE;Absent LLE Stereognosis: Not tested Hot/Cold: Not tested Proprioception: Impaired by gross assessment Cognition Arousal/Alertness: Awake/alert Overall Cognitive Status: Impaired Orientation Level: Oriented X4 Following Commands: Follows one step commands inconsistently;Follows multi-step commands inconsistently;Follows one step commands with increased time;Follows multi-step commands with increased time Safety/Judgement: Decreased awareness of safety precautions;Decreased safety judgement for tasks assessed Decreased Safety/Judgement: Decreased awareness of need for assistance Safety/Judgement - Other Comments: decreased cognition questionably secondary to medications Coordination Gross Motor Movements are Fluid and Coordinated: Yes Fine Motor Movements are Fluid and Coordinated: Yes  Prior Function: Prior Function Level of Independence: Independent with basic ADLs;Independent with homemaking with ambulation;Independent with transfers Driving: Yes  ADLs/Mobility:  Current Functional Level ADL Eating/Feeding: Simulated;Set up Eating/Feeding Details (indicate cue type and reason): With drinking from cup Where Assessed - Eating/Feeding: Chair Grooming: Performed;Teeth care;Minimal assistance Grooming Details (indicate cue type and  reason): Pt. required mod cues for back precautions.  Pt. stands with hips flexed, but attempts repetatively attempts to prop on  elbows Where Assessed - Grooming: Standing at sink Upper Body Bathing: Not assessed Lower Body Bathing: Not assessed Upper Body Dressing: Performed;Minimal assistance Upper Body Dressing Details (indicate cue type and reason): With donning gown Where Assessed - Upper Body Dressing: Sitting, bed Lower Body Dressing: +1 Total assistance;Performed Lower Body Dressing Details (indicate cue type and reason): Pt. unable to reach bilateral feet to don socks Where Assessed - Lower Body Dressing: Sit to stand from bed Toilet Transfer: Simulated;+2 Total assistance Toilet Transfer Details (indicate cue type and reason): Pt. requires cues for upright posture and assist to stay inside walker Toilet Transfer Method: Ambulating Toilet Transfer Equipment: Comfort height toilet;Grab bars Toileting - Clothing Manipulation: Simulated;+1 Total assistance Where Assessed - Camera operator Manipulation: Standing Toileting - Hygiene: Maximal assistance;Performed Where Assessed - Toileting Hygiene: Rolling right and/or left (Pt. incontinent large amount of bloody stool) Tub/Shower Transfer: Not assessed Tub/Shower Transfer Method: Not assessed Equipment Used: Rolling walker ADL Comments: Pt. motivated to participate, but does demonstrate impaired memory/recall of info.  Instructions for activity provided prior to performing, then when attempting to assit pt with sit to stand she would state, "what are we doing?"  Bed Mobility Bed Mobility: Yes Rolling Right: 4: Min assist;With rail Rolling Right Details (indicate cue type and reason): see PT comments Rolling Left: 4: Min assist;With rail Rolling Left Details (indicate cue type and reason): see PT notes for details Left Sidelying to Sit: 3: Mod assist Left Sidelying to Sit Details (indicate cue type and reason): see PT note for details Sit to Supine - Left: 3: Mod assist;HOB flat Sit to Supine - Left Details (indicate cue type and reason): see PT note for  details Transfers Transfers: Yes Sit to Stand: 1: +2 Total assist;Patient percentage (comment);From bed;From chair/3-in-1;With upper extremity assist Sit to Stand Details (indicate cue type and reason): See PT note Stand to Sit: 1: +2 Total assist;Patient percentage (comment);To chair/3-in-1;To bed;With upper extremity assist Stand to Sit Details: see PT note Stand Pivot Transfers - DO NOT USE: 3: Mod assist Stand Pivot Transfer Details (indicate cue type and reason): verbal cues for postural control as pt. placing weight on heels for transfer with RW. Needed asssit to move LEs and guide RW.   Squat Pivot Transfers: Not tested (comment) Ambulation/Gait Ambulation/Gait: Yes Ambulation/Gait Assistance: 1: +2 Total assist;Patient percentage (comment) (+2 total assist (pt=60%)) Ambulation/Gait Assistance Details (indicate cue type and reason): Assist for balance with cues to extend hips and trunk.  Pt with narrow BOS and increased hip/knee flexion especially with fatigue.  Chair follow due to fatigue.  Cues to attempt heel strike bilaterally with gait. Ambulation Distance (Feet): 50 Feet (40 feet and then 10 feet) Assistive device: Rolling walker Gait Pattern: Decreased dorsiflexion - right;Decreased dorsiflexion - left;Right foot flat;Left foot flat;Right genu recurvatum;Left genu recurvatum;Trunk flexed Stairs: No Wheelchair Mobility Wheelchair Mobility: No Wheelchair Assistance: Not tested (comment) Posture/Postural Control Posture/Postural Control: No significant limitations Balance Balance Assessed: Yes Static Sitting Balance Static Sitting - Level of Assistance: Not tested (comment) Static Standing Balance Static Standing - Level of Assistance: 3: Mod assist (Assist for balance with cues to extend trunk.) Static Standing - Comment/# of Minutes: 5 Dynamic Standing Balance Dynamic Standing - Level of Assistance: Not tested (comment)  Home Assistive Devices/Equipment:  Home Assistive  Devices/Equipment Home Assistive Devices/Equipment: Bedside Commode;Walker (specify type)  Discharge Planning:  Discharge  Planning Living Arrangements: Alone;Family members Support Systems: Family members;Friends/neighbors Assistance Needed: pre-arranged Do you have any problems obtaining your medications?: No Type of Residence: Private residence [place]: No Patient expects to be discharged to:: home Case Management Consult Needed: Yes (Comment)  Previous Home Environment:  Previous Home Environment Living Arrangements: Alone;Family members Support Systems: Family members;Friends/neighbors Assistance Needed: pre-arranged Do you have any problems obtaining your medications?: No Type of Residence: Private residence [place]: No Patient expects to be discharged to:: home  Discharge Living Setting:    Anticipated Caregiver: Daughter and friend  [name], dtr is [phone]; friend is [name] at [phone]. They can provide supervision to min assist. 24/7 available. Discharge plan has been discussed with [name] and she is in agreement with plan. Caregivers have no issues with Lodging and Transportation.  Goals/Additional Needs:  Supervision to min assist goals with P.T. and O.T.   ELOS 2 weeks  Special Service Needs: outpatient dialysis T, TH, Sat. Did drive herself PTA. Hassan Rowan can transport after d/c home.   Pt/Family Agrees to Admission and willing to participate: Yes Program Orientation Provided and Reviewed with Pt/Caregiver Including Roles and Responsibilities: Yes  Preadmission Screen Completed By:  Cleatrice Burke, 02/25/2011 9:41 AM  Patient's condition: Please see addendum by pysician.  Preadmission Screen Competed by:Danne Baxter, RN, [place], [place] 02/25/2011.  Discussed status with Dr. Naaman Plummer on 02/25/11 at 0930 and received telephone approval for admission today.  Admission Coordinator:  Cleatrice Burke, time [id number]  Date 02/25/2011.  Marland Kitchen

## 2011-02-22 NOTE — Consult Note (Signed)
Wound care: Following patient for ostomy care.  Requested to assess buttocks.  Pt transferred to 6700 today from ICU, c/o soreness to buttocks.  2 areas of stage 2 intact dark brown blistered areas to buttocks; left 1X2cm and right 2X2cm.  Both with intact skin and no odor or drainage.   Plan Barrier cream to proect and promote healing.  Chair cushion to reduce pressure when OOB.   Julien Girt, RN, MSN, [employer]  [phone]

## 2011-02-22 NOTE — Progress Notes (Signed)
[facility] Progress Note  Subjective: No abdominal pain or output from Hartmann's pouch  Objective: Vital signs in last 24 hours: Temp:  [98.1 F (36.7 C)-100.2 F (37.9 C)] 98.2 F (36.8 C) (12/07 0500) Pulse Rate:  [97-142] 106  (12/07 0500) Resp:  [15-26] 16  (12/07 0500) BP: (102-164)/(55-103) 117/72 mmHg (12/07 0500) SpO2:  [96 %-100 %] 100 % (12/07 0500) Weight:  [56.3 kg (124 lb 1.9 oz)-60.6 kg (133 lb 9.6 oz)] 125 lb 14.1 oz (57.1 kg) (12/06 2224) Weight change: -0.8 kg (-1 lb 12.2 oz)   PE: Greenish stool in ileostomy Lab Results: Results for orders placed during the hospital encounter of 02/09/11 (from the past 24 hour(s))  RENAL FUNCTION PANEL     Status: Abnormal   Collection Time   02/21/11  1:30 PM      Component Value Range   Sodium 135  135 - 145 (mEq/L)   Potassium 3.9  3.5 - 5.1 (mEq/L)   Chloride 98  96 - 112 (mEq/L)   CO2 24  19 - 32 (mEq/L)   Glucose, Bld 92  70 - 99 (mg/dL)   BUN 21  6 - 23 (mg/dL)   Creatinine, Ser 5.44 (*) 0.50 - 1.10 (mg/dL)   Calcium 7.0 (*) 8.4 - 10.5 (mg/dL)   Phosphorus 3.8  2.3 - 4.6 (mg/dL)   Albumin 1.5 (*) 3.5 - 5.2 (g/dL)   GFR calc non Af Amer 8 (*) >90 (mL/min)   GFR calc Af Amer 9 (*) >90 (mL/min)  CBC     Status: Abnormal   Collection Time   02/21/11  1:30 PM      Component Value Range   WBC 11.2 (*) 4.0 - 10.5 (K/uL)   RBC 3.21 (*) 3.87 - 5.11 (MIL/uL)   Hemoglobin 9.3 (*) 12.0 - 15.0 (g/dL)   HCT 28.1 (*) 36.0 - 46.0 (%)   MCV 87.5  78.0 - 100.0 (fL)   MCH 29.0  26.0 - 34.0 (pg)   MCHC 33.1  30.0 - 36.0 (g/dL)   RDW 15.9 (*) 11.5 - 15.5 (%)   Platelets 467 (*) 150 - 400 (K/uL)  PROTIME-INR     Status: Abnormal   Collection Time   02/21/11  1:30 PM      Component Value Range   Prothrombin Time 17.4 (*) 11.6 - 15.2 (seconds)   INR 1.40  0.00 - 1.49   APTT     Status: Abnormal   Collection Time   02/21/11  1:30 PM      Component Value Range   aPTT 44 (*) 24 - 37 (seconds)     Studies/Results: No results found.    Assessment: Ischemic colitis, status post resection colostomy and Hartmann's pouch, remaining pouch appears viable, tolerating regular diet  Plan: Per surgical service, we'll sign off for now.    [id number] C 02/22/2011, 7:35 AM

## 2011-02-22 NOTE — Progress Notes (Signed)
ANTIBIOTIC CONSULT NOTE - FOLLOW UP  Pharmacy Consult for vancomycin/fluconazole  Indication: rule out sepsis/intra-abdominal infection   Allergies  Allergen Reactions  . Contrast Media (Iodinated Diagnostic Agents)     Seziure  . Iohexol Other (See Comments)    Reaction is convulsions    Patient Measurements: Height: 5\' 5"  (165.1 cm) Weight: 125 lb 14.1 oz (57.1 kg) IBW/kg (Calculated) : 57    Vital Signs: Temp: 98.2 F (36.8 C) (12/07 0500) Temp src: Oral (12/07 0500) BP: 117/72 mmHg (12/07 0500) Pulse Rate: 106  (12/07 0500) Intake/Output from previous day: 12/06 0701 - 12/07 0700 In: 110 [P.O.:60; IV Piggyback:50] Out: [id number] [Stool:100] Intake/Output from this shift: Total I/O In: -  Out: 100 [Stool:100]  Labs:  [facility] 02/21/11 1330 02/20/11 0500 02/19/11 1636  WBC 11.2* 15.7* --  HGB 9.3* 9.1* 10.0*  PLT 467* 365 --  LABCREA -- -- --  CREATININE 5.44* 3.88* --   Estimated Creatinine Clearance: 9.9 ml/min (by C-G formula based on Cr of 5.44). No results found for this basename: VANCOTROUGH:2,VANCOPEAK:2,VANCORANDOM:2,GENTTROUGH:2,GENTPEAK:2,GENTRANDOM:2,TOBRATROUGH:2,TOBRAPEAK:2,TOBRARND:2,AMIKACINPEAK:2,AMIKACINTROU:2,AMIKACIN:2, in the last 72 hours   Microbiology: Recent Results (from the past 720 hour(s))  SURGICAL PCR SCREEN     Status: Normal   Collection Time   01/25/11  9:10 AM      Component Value Range Status Comment   MRSA, PCR NEGATIVE  NEGATIVE  Final    Staphylococcus aureus NEGATIVE  NEGATIVE  Final   CULTURE, BLOOD (ROUTINE X 2)     Status: Normal   Collection Time   02/03/11  4:09 PM      Component Value Range Status Comment   Specimen Description BLOOD LEFT ARM   Final    Special Requests     Final    Value: BOTTLES DRAWN AEROBIC AND ANAEROBIC 8CC AER, [place] ANA   Setup Time [id number]   Final    Culture NO GROWTH 5 DAYS   Final    Report Status 02/09/2011 FINAL   Final   CULTURE, BLOOD (ROUTINE X 2)     Status: Normal   Collection Time   02/03/11  4:19 PM      Component Value Range Status Comment   Specimen Description BLOOD LEFT HAND   Final    Special Requests BOTTLES DRAWN AEROBIC ONLY [place]   Final    Setup Time [id number]   Final    Culture NO GROWTH 5 DAYS   Final    Report Status 02/09/2011 FINAL   Final   CULTURE, BLOOD (ROUTINE X 2)     Status: Normal   Collection Time   02/06/11  5:40 PM      Component Value Range Status Comment   Specimen Description BLOOD HEMODIALYSIS GRAFT   Final    Special Requests BOTTLES DRAWN AEROBIC AND ANAEROBIC 10CC   Final    Setup Time [id number]   Final    Culture NO GROWTH 5 DAYS   Final    Report Status 02/13/2011 FINAL   Final   CULTURE, BLOOD (ROUTINE X 2)     Status: Normal   Collection Time   02/06/11  6:40 PM      Component Value Range Status Comment   Specimen Description BLOOD HEMODIALYSIS GRAFT   Final    Special Requests BOTTLES DRAWN AEROBIC AND ANAEROBIC 10CC   Final    Setup Time [id number]   Final    Culture NO GROWTH 5 DAYS   Final    Report Status 02/13/2011 FINAL  Final   CULTURE, BLOOD (ROUTINE X 2)     Status: Normal   Collection Time   02/11/11  8:54 PM      Component Value Range Status Comment   Specimen Description BLOOD LEFT ARM   Final    Special Requests     Final    Value: BOTTLES DRAWN AEROBIC AND ANAEROBIC 10CC AER 5CC ANA   Setup Time [id number]   Final    Culture NO GROWTH 5 DAYS   Final    Report Status 02/18/2011 FINAL   Final   CULTURE, BLOOD (ROUTINE X 2)     Status: Normal   Collection Time   02/11/11  8:54 PM      Component Value Range Status Comment   Specimen Description BLOOD LEFT HAND   Final    Special Requests BOTTLES DRAWN AEROBIC AND ANAEROBIC 10CC   Final    Setup Time [id number]   Final    Culture NO GROWTH 5 DAYS   Final    Report Status 02/18/2011 FINAL   Final     Anti-infectives     Start     Dose/Rate Route Frequency Ordered Stop   02/19/11 1200   vancomycin (VANCOCIN) 750 mg in  sodium chloride 0.9 % 150 mL IVPB        750 mg 150 mL/hr over 60 Minutes Intravenous Every T-Th-Sa (Hemodialysis) 02/19/11 0836     02/14/11 1200   vancomycin (VANCOCIN) 500 mg in sodium chloride 0.9 % 100 mL IVPB  Status:  Discontinued        500 mg 100 mL/hr over 60 Minutes Intravenous Every Hemodialysis 02/11/11 1122 02/19/11 0835   02/13/11 1700   fluconazole (DIFLUCAN) IVPB 200 mg        200 mg 100 mL/hr over 60 Minutes Intravenous Every 48 hours 02/13/11 1642     02/12/11 1200   vancomycin (VANCOCIN) 500 mg in sodium chloride 0.9 % 100 mL IVPB  Status:  Discontinued        500 mg 100 mL/hr over 60 Minutes Intravenous Every T-Th-Sa (Hemodialysis) 02/09/11 2050 02/11/11 1120   02/11/11 1600   vancomycin (VANCOCIN) IVPB 1000 mg/200 mL premix  Status:  Discontinued        1,000 mg 200 mL/hr over 60 Minutes Intravenous  Once 02/11/11 1119 02/19/11 0835   02/09/11 2200  piperacillin-tazobactam (ZOSYN) IVPB 2.25 g       2.25 g 100 mL/hr over 30 Minutes Intravenous 3 times per day 02/09/11 2050     02/09/11 2100   piperacillin-tazobactam (ZOSYN) IVPB 3.375 g  Status:  Discontinued        3.375 g 12.5 mL/hr over 240 Minutes Intravenous Every 8 hours 02/09/11 2034 02/09/11 2050          Assessment: 64 yof on vancomycin D#16, zosyn D#13 and fluconazole D#10 for r/o sepsis and possible intra-abdominal infection. Pt is afebrile and WBC has been trending down. All cultures remain negative.   Goal of Therapy:  Pre-HD vanc level 15-25  Plan:  1. Continue vancomycin 750mg  IV after each HD and Fluconazole 200mg  q 48hr  2. Obtain vancomycin levels at steady state  3. Please clarify duration of antibiotic therapy   Orlinda Blalock 02/22/2011,9:17 AM

## 2011-02-22 NOTE — Progress Notes (Signed)
Assessment/Plan:  1. S/p hemicolectomy for ischemic bowel -  2,. Left arm swelling -. doppler was neg  2. ESRD - TTS ;via AVG  3. Anemia - max ESA And iron. Appears stable.  4. Secondary hyperparathyroidism -  5. HTN/volume - overloaded..better, but still xs 6. Nutrition - albumin very low; on nepro TID  7. ID - Vanc, Zosyn, fluconazole- Stop all antimicrobials 8. GIB-?resolved: monitor hgb, transfuse as needed, s/p nondiagnostic colo-sigmoidoscopy . Disp - planning back to rehab when stable? Monday   S:Still weak, wants to get back to rehab O:BP 125/79  Pulse 110  Temp(Src) 99.1 F (37.3 C) (Oral)  Resp 18  Ht 5\' 5"  (1.651 m)  Wt 57.1 kg (125 lb 14.1 oz)  BMI 20.95 kg/m2  SpO2 100%  Intake/Output Summary (Last 24 hours) at 02/22/11 1213 Last data filed at 02/22/11 1000  Gross per 24 hour  Intake    397 ml  Output   4551 ml  Net  -4154 ml   Weight change: -0.8 kg (-1 lb 12.2 oz) [id number] and appropriate CVS:RRR Resp:clear  [id number] Ext:1-2+ le edema    . albumin human  12.5 g Intravenous Once  . albumin human      . calcium carbonate  400 mg of elemental calcium Oral TID WC  . darbepoetin (ARANESP) injection - DIALYSIS  200 mcg Intravenous Q Tue-HD  . feeding supplement (NEPRO CARB STEADY)  237 mL Oral TID WC  . ferric glucontate (NULECIT) IV  125 mg Intravenous Q T,Th,Sa-HD  . fluconazole (DIFLUCAN) IV  200 mg Intravenous Q48H  . multivitamin  1 tablet Oral Daily  . oxyCODONE-acetaminophen      . pantoprazole (PROTONIX) IV  40 mg Intravenous Q12H  . piperacillin-tazobactam (ZOSYN)  IV  2.25 g Intravenous Q8H  . vancomycin  750 mg Intravenous Q T,Th,Sa-HD   No results found. BMET  Lab 02/21/11 1330 02/20/11 0500 02/19/11 0900 02/19/11 0414 02/18/11 1300 02/18/11 0411 02/16/11 0500  NA 135 136 138 137 137 135 136  K 3.9 3.8 4.3 4.2 4.1 4.0 3.5  CL 98 99 101 101 100 98 100  CO2 24 26 21 24 24 26 27   GLUCOSE 92 68* 67* 72 84 111* 95  BUN 21 16 33* 31* 27* 23  26*  CREATININE 5.44* 3.88* 6.46* 6.06* 5.14* 4.59* 4.62*  ALB -- -- -- -- -- -- --  CALCIUM 7.0* 7.2* 6.8* 6.6* 6.9* 6.9* 6.8*  PHOS 3.8 -- 4.3 -- -- 2.7 --   CBC  Lab 02/21/11 1330 02/20/11 0500 02/19/11 1636 02/19/11 0414 02/18/11 1300 02/18/11 0411 02/16/11 1730 02/16/11 0500  WBC 11.2* 15.7* -- 14.7* 15.1* -- -- --  NEUTROABS -- -- -- -- -- 10.9* 12.3* 9.6*  HGB 9.3* 9.1* 10.0* 7.0* -- -- -- --  HCT 28.1* 27.3* 29.4* 21.4* -- -- -- --  MCV 87.5 86.4 -- 89.2 88.7 -- -- --  PLT 467* 365 -- 379 482* -- -- --    [name] C

## 2011-02-22 NOTE — Progress Notes (Signed)
Filed Vitals:   02/21/11 1943 02/21/11 2118 02/21/11 2224 02/22/11 0500  BP: 105/62  114/75 117/72  Pulse:   112 106  Temp: 99.9 F (37.7 C) 98.7 F (37.1 C) 98.2 F (36.8 C) 98.2 F (36.8 C)  TempSrc: Oral Oral Oral Oral  Resp: 17 15 16 16   Height:   5\' 5"  (1.651 m)   Weight:   57.1 kg (125 lb 14.1 oz)   SpO2:   99% 100%    CBC  Basename 02/21/11 1330 02/20/11 0500  WBC 11.2* 15.7*  HGB 9.3* 9.1*  HCT 28.1* 27.3*  PLT 467* 365   BMET  Basename 02/21/11 1330 02/20/11 0500  NA 135 136  K 3.9 3.8  CL 98 99  CO2 24 26  GLUCOSE 92 68*  BUN 21 16  CREATININE 5.44* 3.88*  CALCIUM 7.0* 7.2*    Patient resting comfortably in bed. Continuing PT and OT.   Plan: PT, OT. Transfer to rehabilitation when stable from nephrology and general surgical perspective.

## 2011-02-22 NOTE — Progress Notes (Signed)
3 Days Post-Op  Subjective: Pt ok. Some soreness. Had old blood clot BM this am. Otherwise eating well.  Objective: Vital signs in last 24 hours: Temp:  [98.2 F (36.8 C)-100.2 F (37.9 C)] 98.2 F (36.8 C) (12/07 0500) Pulse Rate:  [97-142] 106  (12/07 0500) Resp:  [15-26] 16  (12/07 0500) BP: (102-164)/(55-103) 117/72 mmHg (12/07 0500) SpO2:  [96 %-100 %] 100 % (12/07 0500) Weight:  [124 lb 1.9 oz (56.3 kg)-133 lb 9.6 oz (60.6 kg)] 125 lb 14.1 oz (57.1 kg) (12/06 2224) Last BM Date: 02/21/11  Intake/Output this shift:    Physical Exam: BP 117/72  Pulse 106  Temp(Src) 98.2 F (36.8 C) (Oral)  Resp 16  Ht 5\' 5"  (1.651 m)  Wt 125 lb 14.1 oz (57.1 kg)  BMI 20.95 kg/m2  SpO2 100% Abdomen: soft, ND. Mildly sore at incision without erythema. Incision c/d/i Ostomy viable, good output.  Labs: CBC  Basename 02/21/11 1330 02/20/11 0500  WBC 11.2* 15.7*  HGB 9.3* 9.1*  HCT 28.1* 27.3*  PLT 467* 365   BMET  Basename 02/21/11 1330 02/20/11 0500  NA 135 136  K 3.9 3.8  CL 98 99  CO2 24 26  GLUCOSE 92 68*  BUN 21 16  CREATININE 5.44* 3.88*  CALCIUM 7.0* 7.2*   LFT  Basename 02/21/11 1330  PROT --  ALBUMIN 1.5*  AST --  ALT --  ALKPHOS --  BILITOT --  BILIDIR --  IBILI --  LIPASE --   PT/INR  Basename 02/21/11 1330  LABPROT 17.4*  INR 1.40   ABG No results found for this basename: PHART:2,PCO2:2,PO2:2,HCO3:2 in the last 72 hours  Studies/Results: No results found.  Assessment: Active Problems:  Acute ischemic colitis   Procedure(s): COLONOSCOPY  Plan: Doing well. Blood clot BM this am, hard to tell if active bleed., Hgb up today. Will d/w MD Can come off all abx from our standpoint.  LOS: 13 days    [name] 02/22/2011

## 2011-02-22 NOTE — Progress Notes (Signed)
Noted Dr. Amedeo Plenty follow up this morning. Upon arrival, patient on [facility]. Assisted back to bed by RN and myself and noted BM with blood noted. RN at bedside and aware. I contacted Dr. Georganna Skeans and discussed findings. BM remains in [facility] for further assessment. I am seeking clarification of when patient medically ready to be readmitted to inpatient acute rehabilitation. I will follow up after further assessment by surgeon and attending. Pager [phone]

## 2011-02-22 NOTE — Progress Notes (Signed)
I feel patient may go to rehab. The clot she passed is likely old and Hb is improving. GI F/U appreciated. [name]

## 2011-02-23 MED ORDER — NEPRO/CARBSTEADY PO LIQD
237.0000 mL | Freq: Three times a day (TID) | ORAL | Status: DC
  Administered 2011-02-23 – 2011-02-25 (×4): 237 mL via ORAL

## 2011-02-23 MED ORDER — MEGESTROL ACETATE 400 MG/10ML PO SUSP
200.0000 mg | Freq: Two times a day (BID) | ORAL | Status: DC
  Administered 2011-02-23 – 2011-02-25 (×5): 200 mg via ORAL
  Filled 2011-02-23 (×6): qty 5

## 2011-02-23 NOTE — Progress Notes (Signed)
Subjective:  Sitting up in [facility]; Feeling better but very weak with no appetite  Vital signs in last 24 hours: Filed Vitals:   02/22/11 1438 02/22/11 1800 02/23/11 0706 02/23/11 0900  BP: 113/72 129/77 119/82 125/75  Pulse: 112 107 101 106  Temp: 98.1 F (36.7 C) 98 F (36.7 C) 98.2 F (36.8 C) 97.9 F (36.6 C)  TempSrc: Oral Oral Oral Oral  Resp: 17 18 19 18   Height:      Weight:      SpO2: 95% 96% 97% 98%   Weight change:   Intake/Output Summary (Last 24 hours) at 02/23/11 1317 Last data filed at 02/23/11 0900  Gross per 24 hour  Intake    600 ml  Output      0 ml  Net    600 ml   Labs: Basic Metabolic Panel:  Lab [id number] 1330 02/20/11 0500 02/19/11 0900 02/18/11 0411  NA 135 136 138 --  K 3.9 3.8 4.3 --  CL 98 99 101 --  CO2 24 26 21  --  GLUCOSE 92 68* 67* --  BUN 21 16 33* --  CREATININE 5.44* 3.88* 6.46* --  CALCIUM 7.0* 7.2* 6.8* --  ALB -- -- -- --  PHOS 3.8 -- 4.3 2.7   Liver Function Tests:  Lab 02/21/11 1330 02/19/11 0900 02/18/11 0411  AST -- -- --  ALT -- -- --  ALKPHOS -- -- --  BILITOT -- -- --  PROT -- -- --  ALBUMIN 1.5* 1.4* 1.6*   No results found for this basename: LIPASE:3,AMYLASE:3 in the last 168 hours No results found for this basename: AMMONIA:3 in the last 168 hours CBC:  Lab 02/21/11 1330 02/20/11 0500 02/19/11 1636 02/19/11 0414 02/18/11 1300 02/18/11 0411 02/16/11 1730  WBC 11.2* 15.7* -- 14.7* -- -- --  NEUTROABS -- -- -- -- -- 10.9* 12.3*  HGB 9.3* 9.1* 10.0* -- -- -- --  HCT 28.1* 27.3* 29.4* -- -- -- --  MCV 87.5 86.4 -- 89.2 88.7 88.3 --  PLT 467* 365 -- 379 -- -- --   Cardiac Enzymes: No results found for this basename: CKTOTAL:5,CKMB:5,CKMBINDEX:5,TROPONINI:5 in the last 168 hours CBG:  Lab 02/22/11 1644 02/22/11 1124 02/22/11 1019 02/22/11 0759 02/17/11 1240  GLUCAP 111* 128* 103* 67* 92    Iron Studies: No results found for this basename: IRON,TIBC,TRANSFERRIN,FERRITIN in the last 72  hours Studies/Results: No results found. Medications:      . calcium carbonate  400 mg of elemental calcium Oral TID WC  . darbepoetin (ARANESP) injection - DIALYSIS  200 mcg Intravenous Q Tue-HD  . feeding supplement (NEPRO CARB STEADY)  237 mL Oral TID WC  . multivitamin  1 tablet Oral Daily  . pantoprazole  40 mg Oral BID AC    I  have reviewed scheduled and prn medications.  Physical Exam: General: comfortable Heart: RRR, 2-3/6 SEM Lungs: CTAB Abdomen: soft, NT/ND, Colostomy RLQ, semi-liquid stool Extremities: 2+ BLE edema; right foot drop  Dialysis Access: RUA AVG, +T/B  Problem/Plan: 1.Lumbar stenosis with radiculopathy-S/P laminectomy; needs additional rehab services. Has foot drop since before September due to lumbar stenosis.  2.Ischemic Colitis-S/P right hemicolectomy and ileostomy 11/24. Now off all antibiotics. Daily PPI; Wound care nurse following.  3.ESRD-TTS [place]; HD planned for today  4. Anemia of Renal Failure- Max Aranesp; appears stable; T-Sat 20% 11/26 with high Ferritin (2404); no IV iron; will recheck iron stores; Transfuse if symptomatic and or < 7.0  5. Secondary hyperparathyroidism -  phos good; Tums with meals; no vitamin D; follow closely secondary poor appetite 6. HTN-controlled; however, some extra volume; max UF with HD treatments 7. Nutrition - severe protein/calorie malnutrition with albumin 1.5 02/20/11; appetite poor, has Nepro; add Megace with RD to evaluate 8. Deconditioning- prolonged hospitalization- PT/OT/ Rehab on Monday 9. Disposition- readmission to inpatient rehab Monday.   Marni Griffon, FNP-C [facility] Pager [phone]  02/23/2011,1:17 PM  LOS: 14 days   Patient stable from renal standpoint.  Needs further rehab, for transfer back early this week.  No further GI bleeding.  Patient seen and examined and agree with A/P as above.   Orchard D 02/23/2011, 6:51 PM Cell #  [phone]

## 2011-02-23 NOTE — Progress Notes (Signed)
4 Days Post-Op  Subjective: Complains of foot drop. No abnormal pain  Objective: Vital signs in last 24 hours: Temp:  [98 F (36.7 C)-99.1 F (37.3 C)] 98.2 F (36.8 C) (12/08 0706) Pulse Rate:  [101-112] 101  (12/08 0706) Resp:  [17-19] 19  (12/08 0706) BP: (113-129)/(72-82) 119/82 mmHg (12/08 0706) SpO2:  [95 %-100 %] 97 % (12/08 0706) Last BM Date: 02/22/11  Intake/Output from previous day: 12/07 0701 - 12/08 0700 In: 527 [P.O.:477; IV Piggyback:50] Out: 101 [Stool:101] Intake/Output this shift:    GI: soft, non-tender; bowel sounds normal; no masses,  no organomegaly Extremities: bilateral foot drop  Lab Results:   Basename 02/21/11 1330  WBC 11.2*  HGB 9.3*  HCT 28.1*  PLT 467*   BMET  Basename 02/21/11 1330  NA 135  K 3.9  CL 98  CO2 24  GLUCOSE 92  BUN 21  CREATININE 5.44*  CALCIUM 7.0*   PT/INR  Basename 02/21/11 1330  LABPROT 17.4*  INR 1.40   ABG No results found for this basename: PHART:2,PCO2:2,PO2:2,HCO3:2 in the last 72 hours  Studies/Results: No results found.  Anti-infectives: Anti-infectives     Start     Dose/Rate Route Frequency Ordered Stop   02/19/11 1200   vancomycin (VANCOCIN) 750 mg in sodium chloride 0.9 % 150 mL IVPB  Status:  Discontinued        750 mg 150 mL/hr over 60 Minutes Intravenous Every T-Th-Sa (Hemodialysis) 02/19/11 0836 02/22/11 1236   02/14/11 1200   vancomycin (VANCOCIN) 500 mg in sodium chloride 0.9 % 100 mL IVPB  Status:  Discontinued        500 mg 100 mL/hr over 60 Minutes Intravenous Every Hemodialysis 02/11/11 1122 02/19/11 0835   02/13/11 1700   fluconazole (DIFLUCAN) IVPB 200 mg  Status:  Discontinued        200 mg 100 mL/hr over 60 Minutes Intravenous Every 48 hours 02/13/11 1642 02/22/11 1236   02/12/11 1200   vancomycin (VANCOCIN) 500 mg in sodium chloride 0.9 % 100 mL IVPB  Status:  Discontinued        500 mg 100 mL/hr over 60 Minutes Intravenous Every T-Th-Sa (Hemodialysis) 02/09/11 2050  02/11/11 1120   02/11/11 1600   vancomycin (VANCOCIN) IVPB 1000 mg/200 mL premix  Status:  Discontinued        1,000 mg 200 mL/hr over 60 Minutes Intravenous  Once 02/11/11 1119 02/19/11 0835   02/09/11 2200   piperacillin-tazobactam (ZOSYN) IVPB 2.25 g  Status:  Discontinued        2.25 g 100 mL/hr over 30 Minutes Intravenous 3 times per day 02/09/11 2050 02/22/11 1236   02/09/11 2100   piperacillin-tazobactam (ZOSYN) IVPB 3.375 g  Status:  Discontinued        3.375 g 12.5 mL/hr over 240 Minutes Intravenous Every 8 hours 02/09/11 2034 02/09/11 2050          Assessment/Plan: s/p Procedure(s): COLONOSCOPY Advance diet patient will need physical therapy for her foot drop  LOS: 14 days    TOTH III,[name] S 02/23/2011

## 2011-02-23 NOTE — Progress Notes (Signed)
INITIAL ADULT NUTRITION ASSESSMENT Date: 02/23/2011   Time: 3:10 PM Reason for Assessment: Poor po intake  ASSESSMENT: Female 60 y.o.  Patient Active Problem List  Diagnoses  . Lumbar stenosis with neurogenic claudication  . Spondylolisthesis of lumbar region  . End stage renal disease on dialysis  . Hypertension  . Unspecified viral hepatitis C without hepatic coma  . Radiculopathy  . Anemia of chronic kidney failure  . Acute ischemic colitis   Hx:  Past Medical History  Diagnosis Date  . Seizures     r/t HTN in 1990's x 1  . Brain aneurysm   . Blood transfusion 1990's    r/t Kidney removal surgery  . Arthritis     Back  . Anemia   . Umbilical hernia age 63  . Heart murmur     Born with heart mumur, does not require follow up per pt  . Hypertension     Does not see a heart doctor, had pre transplant stress test at [facility]    . Diabetes mellitus     Borderline  . End stage renal disease on dialysis     T/Th/Sat dialysis on [employer]  . End stage renal disease on dialysis     01-29-2011  . Hepatitis C    Related Meds:     . calcium carbonate  400 mg of elemental calcium Oral TID WC  . darbepoetin (ARANESP) injection - DIALYSIS  200 mcg Intravenous Q Tue-HD  . feeding supplement (NEPRO CARB STEADY)  237 mL Oral TID WC  . megestrol  200 mg Oral BID  . multivitamin  1 tablet Oral Daily  . pantoprazole  40 mg Oral BID AC    Ht: 5\' 5"  (165.1 cm)  Wt: 125 lb 14.1 oz (57.1 kg)  Ideal Wt: 57 kg 61.5 kg % Ideal Wt: 100%  Usual Wt: weight upon admission 11/24 121 Lbs. Weight fluctuates up to 140 Lbs during this adm  Body mass index is 20.95 kg/(m^2).  Food/Nutrition Related Hx: Poor appetite  Labs:  CMP     Component Value Date/Time   NA 135 02/21/2011 1330   K 3.9 02/21/2011 1330   CL 98 02/21/2011 1330   CO2 24 02/21/2011 1330   GLUCOSE 92 02/21/2011 1330   BUN 21 02/21/2011 1330   CREATININE 5.44* 02/21/2011 1330   CALCIUM 7.0* 02/21/2011 1330   PROT  6.0 02/14/2011 0420   ALBUMIN 1.5* 02/21/2011 1330   AST 22 02/14/2011 0420   ALT 10 02/14/2011 0420   ALKPHOS 146* 02/14/2011 0420   BILITOT 0.4 02/14/2011 0420   GFRNONAA 8* 02/21/2011 1330   GFRAA 9* 02/21/2011 1330    Intake: I/O last 3 completed shifts: In: 637 [P.O.:537; IV Piggyback:100] Out: 101 [Stool:101] Total I/O In: 360 [P.O.:360] Out: -      Diet Order: Renal  Supplements/Tube Feeding: Nepro prn  IVF:    Estimated Nutritional Needs:   Kcal: 15-1700 Protein: 80-90 g Fluid: 1.7 L  NUTRITION DIAGNOSIS: Inadequate oral intake related to poor appetite as evidenced by poor intake of meals.  MONITORING/EVALUATION(Goals): Improved PO intake, meeting 90 % of estimated needs  EDUCATION NEEDS: -No education needs identified at this time  INTERVENTION: Renal diet Schedule Nepro TID Noted addition of megace Dietitian [id number]  DOCUMENTATION CODES Per approved criteria  -Non-severe (moderate) malnutrition in the context of chronic illness    Zaray Gatchel,KATHY 02/23/2011, 3:10 PM

## 2011-02-23 NOTE — Progress Notes (Signed)
Patient ID: [name], female   DOB: 1951/02/24, 60 y.o.   MRN: [id number] Subjective: Patient reports Doing well from neurosurgical standpoint  Objective: Vital signs in last 24 hours: Temp:  [97.9 F (36.6 C)-98.2 F (36.8 C)] 97.9 F (36.6 C) (12/08 0900) Pulse Rate:  [101-112] 106  (12/08 0900) Resp:  [17-19] 18  (12/08 0900) BP: (113-129)/(72-82) 125/75 mmHg (12/08 0900) SpO2:  [95 %-98 %] 98 % (12/08 0900)  Intake/Output from previous day: 12/07 0701 - 12/08 0700 In: 527 [P.O.:477; IV Piggyback:50] Out: 101 [Stool:101] Intake/Output this shift: Total I/O In: 360 [P.O.:360] Out: -   No issues  Lab Results:  Basename 02/21/11 1330  WBC 11.2*  HGB 9.3*  HCT 28.1*  PLT 467*   BMET  Basename 02/21/11 1330  NA 135  K 3.9  CL 98  CO2 24  GLUCOSE 92  BUN 21  CREATININE 5.44*  CALCIUM 7.0*    Studies/Results: No results found.  Assessment/Plan: Stable  LOS: 14 days  As above   Faythe Ghee, MD 02/23/2011, 11:04 AM

## 2011-02-24 ENCOUNTER — Inpatient Hospital Stay (HOSPITAL_COMMUNITY): Payer: Medicare Other

## 2011-02-24 LAB — CBC
HCT: 31.4 % — ABNORMAL LOW (ref 36.0–46.0)
Hemoglobin: 9.8 g/dL — ABNORMAL LOW (ref 12.0–15.0)
MCH: 28.7 pg (ref 26.0–34.0)
MCHC: 31.2 g/dL (ref 30.0–36.0)
MCV: 92.1 fL (ref 78.0–100.0)
RBC: 3.41 MIL/uL — ABNORMAL LOW (ref 3.87–5.11)

## 2011-02-24 LAB — RENAL FUNCTION PANEL
BUN: 21 mg/dL (ref 6–23)
CO2: 24 mEq/L (ref 19–32)
Calcium: 8.5 mg/dL (ref 8.4–10.5)
Creatinine, Ser: 6.44 mg/dL — ABNORMAL HIGH (ref 0.50–1.10)
GFR calc Af Amer: 7 mL/min — ABNORMAL LOW (ref >90)
Glucose, Bld: 75 mg/dL (ref 70–99)
Phosphorus: 3.1 mg/dL (ref 2.3–4.6)

## 2011-02-24 LAB — IRON AND TIBC
Saturation Ratios: 18 % — ABNORMAL LOW (ref 20–55)
TIBC: 99 ug/dL — ABNORMAL LOW (ref 250–470)
UIBC: 81 ug/dL — ABNORMAL LOW (ref 125–400)

## 2011-02-24 MED ORDER — OXYCODONE-ACETAMINOPHEN 5-325 MG PO TABS
ORAL_TABLET | ORAL | Status: AC
  Filled 2011-02-24: qty 2

## 2011-02-24 NOTE — Progress Notes (Signed)
5 Days Post-Op  Subjective: No complaints today  Objective: Vital signs in last 24 hours: Temp:  [97.5 F (36.4 C)-98.7 F (37.1 C)] 97.6 F (36.4 C) (12/09 0649) Pulse Rate:  [101-113] 101  (12/09 0730) Resp:  [16-22] 22  (12/09 0730) BP: (116-140)/(75-91) 140/89 mmHg (12/09 0730) SpO2:  [93 %-98 %] 96 % (12/09 0649) Weight:  [125 lb 3.5 oz (56.8 kg)-125 lb 10.6 oz (57 kg)] 125 lb 3.5 oz (56.8 kg) (12/09 0649) Last BM Date: 02/23/11  Intake/Output from previous day: 12/08 0701 - 12/09 0700 In: 480 [P.O.:480] Out: -  Intake/Output this shift:    GI: soft with minimal tenderness. Ostomy is pink and productive.  Lab Results:   Basename 02/21/11 1330  WBC 11.2*  HGB 9.3*  HCT 28.1*  PLT 467*   BMET  Basename 02/21/11 1330  NA 135  K 3.9  CL 98  CO2 24  GLUCOSE 92  BUN 21  CREATININE 5.44*  CALCIUM 7.0*   PT/INR  Basename 02/21/11 1330  LABPROT 17.4*  INR 1.40   ABG No results found for this basename: PHART:2,PCO2:2,PO2:2,HCO3:2 in the last 72 hours  Studies/Results: No results found.  Anti-infectives: Anti-infectives     Start     Dose/Rate Route Frequency Ordered Stop   02/19/11 1200   vancomycin (VANCOCIN) 750 mg in sodium chloride 0.9 % 150 mL IVPB  Status:  Discontinued        750 mg 150 mL/hr over 60 Minutes Intravenous Every T-Th-Sa (Hemodialysis) 02/19/11 0836 02/22/11 1236   02/14/11 1200   vancomycin (VANCOCIN) 500 mg in sodium chloride 0.9 % 100 mL IVPB  Status:  Discontinued        500 mg 100 mL/hr over 60 Minutes Intravenous Every Hemodialysis 02/11/11 1122 02/19/11 0835   02/13/11 1700   fluconazole (DIFLUCAN) IVPB 200 mg  Status:  Discontinued        200 mg 100 mL/hr over 60 Minutes Intravenous Every 48 hours 02/13/11 1642 02/22/11 1236   02/12/11 1200   vancomycin (VANCOCIN) 500 mg in sodium chloride 0.9 % 100 mL IVPB  Status:  Discontinued        500 mg 100 mL/hr over 60 Minutes Intravenous Every T-Th-Sa (Hemodialysis) 02/09/11  2050 02/11/11 1120   02/11/11 1600   vancomycin (VANCOCIN) IVPB 1000 mg/200 mL premix  Status:  Discontinued        1,000 mg 200 mL/hr over 60 Minutes Intravenous  Once 02/11/11 1119 02/19/11 0835   02/09/11 2200   piperacillin-tazobactam (ZOSYN) IVPB 2.25 g  Status:  Discontinued        2.25 g 100 mL/hr over 30 Minutes Intravenous 3 times per day 02/09/11 2050 02/22/11 1236   02/09/11 2100   piperacillin-tazobactam (ZOSYN) IVPB 3.375 g  Status:  Discontinued        3.375 g 12.5 mL/hr over 240 Minutes Intravenous Every 8 hours 02/09/11 2034 02/09/11 2050          Assessment/Plan: s/p Procedure(s): COLONOSCOPY ppatient is on renal diet. It appears as though the patient has been transferred to the rehabilitation service. I would agree with that she needs to work with physical therapy. We will follow with you.  LOS: 15 days    TOTH III,PAUL S 02/24/2011

## 2011-02-24 NOTE — Progress Notes (Signed)
Subjective:  Tired following HD this am; "didn't feel like treatment 11 pm last night when they came".  Vital signs in last 24 hours: Filed Vitals:   02/24/11 0930 02/24/11 0948 02/24/11 1020 02/24/11 1049  BP: 141/89 140/96 132/74 119/74  Pulse: 125 118 111 114  Temp:   98 F (36.7 C) 98.2 F (36.8 C)  TempSrc:   Oral Oral  Resp: 22 30  20   Height:      Weight:  55.3 kg (121 lb 14.6 oz)    SpO2:   97% 97%   Weight change:   Intake/Output Summary (Last 24 hours) at 02/24/11 1216 Last data filed at 02/23/11 2038  Gross per 24 hour  Intake    120 ml  Output      0 ml  Net    120 ml   Labs: Basic Metabolic Panel:  Lab [id number] 02/21/11 1330 02/20/11 0500 02/19/11 0900  NA 138 135 136 --  K 4.5 3.9 3.8 --  CL 101 98 99 --  CO2 24 24 26  --  GLUCOSE 75 92 68* --  BUN 21 21 16  --  CREATININE 6.44* 5.44* 3.88* --  CALCIUM 8.5 7.0* 7.2* --  ALB -- -- -- --  PHOS 3.1 3.8 -- 4.3   Liver Function Tests:  Lab 02/24/11 0742 02/21/11 1330 02/19/11 0900  AST -- -- --  ALT -- -- --  ALKPHOS -- -- --  BILITOT -- -- --  PROT -- -- --  ALBUMIN 1.6* 1.5* 1.4*   No results found for this basename: LIPASE:3,AMYLASE:3 in the last 168 hours No results found for this basename: AMMONIA:3 in the last 168 hours CBC:  Lab 02/24/11 0742 02/21/11 1330 02/20/11 0500 02/19/11 0414 02/18/11 1300 02/18/11 0411  WBC 13.7* 11.2* 15.7* -- -- --  NEUTROABS -- -- -- -- -- 10.9*  HGB 9.8* 9.3* 9.1* -- -- --  HCT 31.4* 28.1* 27.3* -- -- --  MCV 92.1 87.5 86.4 89.2 88.7 --  PLT 611* 467* 365 -- -- --   Cardiac Enzymes: No results found for this basename: CKTOTAL:5,CKMB:5,CKMBINDEX:5,TROPONINI:5 in the last 168 hours CBG:  Lab 02/22/11 1644 02/22/11 1124 02/22/11 1019 02/22/11 0759 02/17/11 1240  GLUCAP 111* 128* 103* 67* 92    Iron Studies: No results found for this basename: IRON,TIBC,TRANSFERRIN,FERRITIN in the last 72 hours Studies/Results: No results found. Medications:       . calcium carbonate  400 mg of elemental calcium Oral TID WC  . darbepoetin (ARANESP) injection - DIALYSIS  200 mcg Intravenous Q Tue-HD  . feeding supplement (NEPRO CARB STEADY)  237 mL Oral TID WC  . feeding supplement (NEPRO CARB STEADY)  237 mL Oral TID WC  . megestrol  200 mg Oral BID  . multivitamin  1 tablet Oral Daily  . oxyCODONE-acetaminophen      . pantoprazole  40 mg Oral BID AC    I  have reviewed scheduled and prn medications.  Physical Exam:  General: comfortable  Heart: RRR, 2-3/6 SEM  Lungs: CTAB  Abdomen: soft, NT/ND, Colostomy RLQ, semi-liquid stool  Extremities: 2+ BLE edema; right foot drop  Dialysis Access: RUA AVG, +T/B  Problem/Plan:  1.Lumbar stenosis with radiculopathy-S/P laminectomy; needs additional rehab services. 2.Ischemic Colitis-S/P right hemicolectomy and ileostomy 11/24. Now off all antibiotics. Daily PPI; Wound care nurse following.  3.ESRD-TTS [place]; s/p HD early this am; next tx Tu  4. Anemia of Renal Failure- Max Aranesp; appears stable; T-Sat 20% 11/26 with  high Ferritin (2404); no IV iron; will recheck iron stores; Transfuse if symptomatic and or < 7.0  5. Secondary hyperparathyroidism - phos good; Tums with meals; no vitamin D; follow closely secondary poor appetite  6. HTN-controlled; however, some extra volume; max UF with HD treatments  7. Nutrition - severe protein/calorie malnutrition with albumin 1.5 02/20/11; appetite poor, has Nepro; add Megace with RD to evaluate  8. Deconditioning- prolonged hospitalization- PT/OT/ Rehab on Monday  9. Disposition- readmission to inpatient rehab planned for tomorrow.   Marni Griffon, FNP-C [facility] Pager [phone]  02/24/2011,12:16 PM  LOS: 15 days   Patient seen and examined and agree with A/P as above.   Kelly Splinter, MD [employer] [phone] pager   [phone] cell 02/24/2011, 10:42 PM

## 2011-02-24 NOTE — Progress Notes (Signed)
Patient complaint of pain, md notified x 2 awaiting response.

## 2011-02-25 ENCOUNTER — Inpatient Hospital Stay (HOSPITAL_COMMUNITY)
Admission: RE | Admit: 2011-02-25 | Discharge: 2011-03-21 | DRG: 945 | Disposition: A | Discharge status: Home Health | Payer: Medicare Other | Source: Ambulatory Visit | Attending: Physical Medicine & Rehabilitation | Admitting: Physical Medicine & Rehabilitation

## 2011-02-25 DIAGNOSIS — D631 Anemia in chronic kidney disease: Secondary | ICD-10-CM

## 2011-02-25 DIAGNOSIS — K651 Peritoneal abscess: Secondary | ICD-10-CM

## 2011-02-25 DIAGNOSIS — M48061 Spinal stenosis, lumbar region without neurogenic claudication: Secondary | ICD-10-CM

## 2011-02-25 DIAGNOSIS — R4182 Altered mental status, unspecified: Secondary | ICD-10-CM

## 2011-02-25 DIAGNOSIS — R5381 Other malaise: Secondary | ICD-10-CM

## 2011-02-25 DIAGNOSIS — G5790 Unspecified mononeuropathy of unspecified lower limb: Secondary | ICD-10-CM

## 2011-02-25 DIAGNOSIS — K55059 Acute (reversible) ischemia of intestine, part and extent unspecified: Secondary | ICD-10-CM

## 2011-02-25 DIAGNOSIS — E43 Unspecified severe protein-calorie malnutrition: Secondary | ICD-10-CM

## 2011-02-25 DIAGNOSIS — A498 Other bacterial infections of unspecified site: Secondary | ICD-10-CM

## 2011-02-25 DIAGNOSIS — Z932 Ileostomy status: Secondary | ICD-10-CM

## 2011-02-25 DIAGNOSIS — D72829 Elevated white blood cell count, unspecified: Secondary | ICD-10-CM

## 2011-02-25 DIAGNOSIS — Y833 Surgical operation with formation of external stoma as the cause of abnormal reaction of the patient, or of later complication, without mention of misadventure at the time of the procedure: Secondary | ICD-10-CM

## 2011-02-25 DIAGNOSIS — IMO0002 Reserved for concepts with insufficient information to code with codable children: Secondary | ICD-10-CM

## 2011-02-25 DIAGNOSIS — R7309 Other abnormal glucose: Secondary | ICD-10-CM

## 2011-02-25 DIAGNOSIS — Z87891 Personal history of nicotine dependence: Secondary | ICD-10-CM

## 2011-02-25 DIAGNOSIS — N2581 Secondary hyperparathyroidism of renal origin: Secondary | ICD-10-CM

## 2011-02-25 DIAGNOSIS — N186 End stage renal disease: Secondary | ICD-10-CM

## 2011-02-25 DIAGNOSIS — M47817 Spondylosis without myelopathy or radiculopathy, lumbosacral region: Secondary | ICD-10-CM

## 2011-02-25 DIAGNOSIS — Z992 Dependence on renal dialysis: Secondary | ICD-10-CM

## 2011-02-25 DIAGNOSIS — M4310 Spondylolisthesis, site unspecified: Secondary | ICD-10-CM

## 2011-02-25 DIAGNOSIS — Z79899 Other long term (current) drug therapy: Secondary | ICD-10-CM

## 2011-02-25 DIAGNOSIS — Z5189 Encounter for other specified aftercare: Secondary | ICD-10-CM

## 2011-02-25 DIAGNOSIS — T4275XA Adverse effect of unspecified antiepileptic and sedative-hypnotic drugs, initial encounter: Secondary | ICD-10-CM

## 2011-02-25 DIAGNOSIS — B1920 Unspecified viral hepatitis C without hepatic coma: Secondary | ICD-10-CM

## 2011-02-25 DIAGNOSIS — Z981 Arthrodesis status: Secondary | ICD-10-CM

## 2011-02-25 DIAGNOSIS — M216X9 Other acquired deformities of unspecified foot: Secondary | ICD-10-CM

## 2011-02-25 DIAGNOSIS — T8140XA Infection following a procedure, unspecified, initial encounter: Secondary | ICD-10-CM

## 2011-02-25 DIAGNOSIS — I82629 Acute embolism and thrombosis of deep veins of unspecified upper extremity: Secondary | ICD-10-CM

## 2011-02-25 DIAGNOSIS — I120 Hypertensive chronic kidney disease with stage 5 chronic kidney disease or end stage renal disease: Secondary | ICD-10-CM

## 2011-02-25 MED ORDER — NEPRO/CARBSTEADY PO LIQD
237.0000 mL | Freq: Three times a day (TID) | ORAL | Status: DC
  Administered 2011-02-25 – 2011-03-21 (×52): 237 mL via ORAL
  Filled 2011-02-25 (×4): qty 237

## 2011-02-25 MED ORDER — LIDOCAINE HCL (PF) 1 % IJ SOLN
5.0000 mL | INTRAMUSCULAR | Status: DC | PRN
  Filled 2011-02-25: qty 5

## 2011-02-25 MED ORDER — OXYCODONE-ACETAMINOPHEN 5-325 MG PO TABS
1.0000 | ORAL_TABLET | Freq: Two times a day (BID) | ORAL | Status: DC
  Administered 2011-02-26 – 2011-03-14 (×27): 1 via ORAL
  Filled 2011-02-25 (×10): qty 1
  Filled 2011-02-25: qty 2
  Filled 2011-02-25 (×9): qty 1
  Filled 2011-02-25: qty 2
  Filled 2011-02-25 (×6): qty 1

## 2011-02-25 MED ORDER — LIDOCAINE-PRILOCAINE 2.5-2.5 % EX CREA
1.0000 "application " | TOPICAL_CREAM | CUTANEOUS | Status: DC | PRN
  Filled 2011-02-25: qty 5

## 2011-02-25 MED ORDER — PANTOPRAZOLE SODIUM 40 MG PO TBEC
40.0000 mg | DELAYED_RELEASE_TABLET | Freq: Every day | ORAL | Status: DC
  Administered 2011-02-26 – 2011-03-20 (×21): 40 mg via ORAL
  Filled 2011-02-25 (×21): qty 1

## 2011-02-25 MED ORDER — PENTAFLUOROPROP-TETRAFLUOROETH EX AERO: 1.0000 "application " | INHALATION_SPRAY | CUTANEOUS | Status: DC | PRN

## 2011-02-25 MED ORDER — PROMETHAZINE HCL 25 MG/ML IJ SOLN
12.5000 mg | Freq: Four times a day (QID) | INTRAMUSCULAR | Status: DC | PRN
  Administered 2011-03-09: 12.5 mg via INTRAMUSCULAR

## 2011-02-25 MED ORDER — SORBITOL 70 % SOLN: 30.0000 mL | Freq: Every day | Status: DC | PRN

## 2011-02-25 MED ORDER — DARBEPOETIN ALFA-POLYSORBATE 200 MCG/0.4ML IJ SOLN
200.0000 ug | INTRAMUSCULAR | Status: DC
  Administered 2011-02-27: 200 ug via INTRAVENOUS
  Filled 2011-02-25 (×2): qty 0.4

## 2011-02-25 MED ORDER — CALCIUM CARBONATE ANTACID 500 MG PO CHEW
400.0000 mg | CHEWABLE_TABLET | Freq: Three times a day (TID) | ORAL | Status: DC
  Administered 2011-02-25 – 2011-02-27 (×5): 400 mg via ORAL
  Administered 2011-02-27: 200 mg via ORAL
  Administered 2011-02-27 – 2011-03-06 (×20): 400 mg via ORAL
  Filled 2011-02-25 (×31): qty 2

## 2011-02-25 MED ORDER — ACETAMINOPHEN 325 MG PO TABS
325.0000 mg | ORAL_TABLET | ORAL | Status: DC | PRN
  Administered 2011-03-10: 325 mg via ORAL
  Administered 2011-03-12 – 2011-03-21 (×4): 650 mg via ORAL
  Filled 2011-02-25: qty 1
  Filled 2011-02-25 (×5): qty 2

## 2011-02-25 MED ORDER — PROMETHAZINE HCL 12.5 MG PO TABS: 12.5000 mg | ORAL_TABLET | Freq: Four times a day (QID) | ORAL | Status: DC | PRN

## 2011-02-25 MED ORDER — METOPROLOL TARTRATE 1 MG/ML IV SOLN
5.0000 mg | Freq: Four times a day (QID) | INTRAVENOUS | Status: DC | PRN
  Filled 2011-02-25: qty 5

## 2011-02-25 MED ORDER — PROMETHAZINE HCL 12.5 MG RE SUPP: 12.5000 mg | Freq: Four times a day (QID) | RECTAL | Status: DC | PRN

## 2011-02-25 MED ORDER — OXYCODONE-ACETAMINOPHEN 5-325 MG PO TABS
2.0000 | ORAL_TABLET | ORAL | Status: DC | PRN
  Administered 2011-02-25 – 2011-03-09 (×30): 2 via ORAL
  Administered 2011-03-09: 1 via ORAL
  Administered 2011-03-10 (×3): 2 via ORAL
  Filled 2011-02-25 (×12): qty 2
  Filled 2011-02-25: qty 1
  Filled 2011-02-25 (×3): qty 2
  Filled 2011-02-25 (×2): qty 1
  Filled 2011-02-25 (×6): qty 2
  Filled 2011-02-25: qty 1
  Filled 2011-02-25 (×9): qty 2

## 2011-02-25 MED ORDER — SODIUM CHLORIDE 0.9 % IJ SOLN
125.0000 mg | INTRAVENOUS | Status: AC
  Administered 2011-02-27 – 2011-02-28 (×3): 125 mg via INTRAVENOUS
  Filled 2011-02-25 (×3): qty 10

## 2011-02-25 MED ORDER — RENA-VITE PO TABS
1.0000 | ORAL_TABLET | Freq: Every day | ORAL | Status: DC
  Administered 2011-02-26 – 2011-03-21 (×22): 1 via ORAL
  Filled 2011-02-25 (×27): qty 1

## 2011-02-25 NOTE — H&P (Signed)
Physical Medicine and Rehabilitation Admission H&P   [name] is an 60 y.o. female.    No chief complaint on file. : HPI: This is a 60 year old right-handed black female with end-stage renal disease admitted November 14 with increased low back pain since August 2012 with progressive right foot drop. X-rays and imaging showed grade 2 dynamic degenerative spondylolisthesis of lumbar L4 and lumbar L5. MRI revealed severe multi-factorial spinal stenosis and radiculopathy at lumbar L4-5 and marked to severe multi-factorial stenosis at lumbar L5 and S1. Patient underwent lumbar L4-S1 lumbar laminectomy foraminotomies with decompression as well as lumbar 4 and 5 lumbar L5 and S1 posterior lumbar interbody arthrodesis on November 14 per Dr. Sherwood Gambler. She was fitted with a back brace to be worn when out of bed. Patient with noted history of end-stage renal disease with hemodialysis ongoing per renal services. She was admitted inpatient rehabilitation services on November 16 for comprehensive rehabilitation. She was progressing nicely with therapies requiring moderate assistance for overall mobility progressing nicely with endurance and strength. Patient developed some worsening abdominal pain around November 23 increasing tenderness to epigastrium and right lower quadrant. CT scan of the abdomen performed at November 23 demonstrated right and transverse colon wall thickening and pneumatosis. Patient had had 2-3 bowel movements over the previous 24 hours no blood or diarrhea noted. General surgery was consulted and seen by Dr. Nathaneil Canary a Ninfa Linden. Patient was discharged to acute care services underwent exploratory laparotomy with extended right hemicolectomy and ileostomy for ischemic bowel on November 24 per Dr. Ninfa Linden. Maintained on vancomycin and Zosyn for  Empiric coverage. Patient remained on hemodialysis scheduled Tuesdays Thursdays and Saturdays peripheral services. Noted anemia November 25 transfused  2 units of packed red blood cells for hemoglobin of 6.8. Diet was slowly progressed to a regular consistency which he tolerated well. She did have bright red blood from the rectum x2 with followup per surgery felt to be possibly due to heparin during dialysis and monitored hemoglobin did remain stable no further reports of blood noted. All antibiotics since been discontinued and latest white blood cell count of 11,000 hemoglobin 9.3 on 02/21/2011. Therapies are since been resumed she was requiring total assist of 2 to ambulate 50 feet with a rolling walker as well as when her back corset which could be applied in standing position. Patient is to be admitted back to inpatient rehabilitation services for ongoing comprehensive rehabilitation program.   Review of Systems  Constitutional: Positive for chills and malaise/fatigue.  Eyes: Negative for double vision.  Respiratory: Negative for cough and shortness of breath.   Gastrointestinal: Positive for nausea and abdominal pain.  Genitourinary: Negative for dysuria.  Musculoskeletal: Positive for back pain.  Skin: Negative.   Neurological: Positive for weakness. Negative for dizziness, seizures and headaches.  Psychiatric/Behavioral: Negative.   Past Medical History   Diagnosis  Date   .  Seizures         r/t HTN in 1990's x 1   .  Brain aneurysm     .  Blood transfusion  1990's       r/t Kidney removal surgery   .  Arthritis         Back   .  Anemia     .  Umbilical hernia  age 71   .  Heart murmur         Born with heart mumur, does not require follow up per pt   .  Hypertension  Does not see a heart doctor, had pre transplant stress test at [facility]     .  Diabetes mellitus         Borderline   .  End stage renal disease on dialysis         T/Th/Sat dialysis on [employer]   .  End stage renal disease on dialysis         01-29-2011   .  Hepatitis C      Past Surgical History   Procedure  Date   .  Nephrectomy  2010        right side done at [facility] on transplant list   .  Umbilical hernia repair  age 24- 12   .  Appendectomy  1960's   .  Vascular surgery         right arm dialysis graft   .  Tonsillectomy         as a child   .  Rib resection         d/t kidney removal   .  Back surgery  01/30/2011       laminectomy   .  Hernia repair     .  Laparotomy  02/09/2011       Procedure: EXPLORATORY LAPAROTOMY;  Surgeon: Harl Bowie, MD;  Location: [place];  Service: General;  Laterality: N/A;   .  Partial colectomy  02/09/2011       Procedure: PARTIAL COLECTOMY;  Surgeon: Harl Bowie, MD;  Location: [place];  Service: General;  Laterality: Right;   .  Colostomy  02/09/2011       Procedure: COLOSTOMY;  Surgeon: Harl Bowie, MD;  Location: [place];  Service: General;  Laterality: Right;   .  Colonoscopy  02/19/2011       Procedure: COLONOSCOPY;  Surgeon: Missy Sabins, MD;  Location: [place];  Service: Endoscopy;  Laterality: N/A;    Family History   Problem  Relation  Age of Onset   .  Anesthesia problems  Neg Hx      Social History: reports that she quit smoking about 6 months ago. Her smoking use included Cigarettes. She has a 24 pack-year smoking history. She has never used smokeless tobacco. She reports that she drinks about 1.2 ounces of alcohol per week. She reports that she does not use illicit drugs. Allergies:   Allergies   Allergen  Reactions   .  Contrast Media (Iodinated Diagnostic Agents)         Seziure   .  Iohexol  Other (See Comments)       Reaction is convulsions    Medications Prior to Admission   Medication  Dose  Route  Frequency  Provider  Last Rate  Last Dose   .  0.9 %  sodium chloride infusion     Intravenous  Once  Shann Medal, MD  500 mL/hr at 02/13/11 [phone]      .  acetaminophen (TYLENOL) suppository 650 mg   650 mg  Rectal  Q4H PRN  Kellie A Goldsborough     650 mg at 02/13/11 1258   .  albumin human 25 % solution 12.5 g   12.5 g  Intravenous  Once  Estanislado Emms, MD     12.5 g at 02/21/11 1344   .  albumin human 25 % solution                  .  albumin human 5 % solution 12.5 g   12.5 g  Intravenous  Once  Janeece Riggers, MD     12.5 g at 02/09/11 1746   .  alum & mag hydroxide-simeth (MAALOX PLUS) 400-400-40 MG/5ML suspension 15 mL   15 mL  Oral  Q6H PRN  Grace Bushy Minor, NP     15 mL at 02/15/11 1200   .  calcium carbonate (TUMS - dosed in mg elemental calcium) chewable tablet 400 mg of elemental calcium   400 mg of elemental calcium  Oral  TID WC  Lucrezia Starch, MD     400 mg of elemental calcium at 02/21/11 1815   .  darbepoetin (ARANESP) injection 200 mcg   200 mcg  Intravenous  Once  Lucrezia Starch, MD     200 mcg at 02/11/11 1519   .  darbepoetin (ARANESP) injection 200 mcg   200 mcg  Intravenous  Q Tue-HD  Lucrezia Starch, MD     200 mcg at 02/19/11 [phone]   .  feeding supplement (NEPRO CARB STEADY) liquid 237 mL   237 mL  Oral  PRN  Lucrezia Starch, MD         .  feeding supplement (NEPRO CARB STEADY) liquid 237 mL   237 mL  Oral  TID WC  Alex Gardener [place], PA     237 mL at 02/21/11 1800   .  ferric gluconate (NULECIT) 125 mg in sodium chloride 0.9 % 100 mL IVPB   125 mg  Intravenous  Q T,Th,Sa-HD  Myriam Jacobson, PA     125 mg at 02/21/11 1414   .  fluconazole (DIFLUCAN) IVPB 200 mg   200 mg  Intravenous  Q48H  Kellie A Goldsborough     200 mg at 02/21/11 1815   .  lidocaine (XYLOCAINE) 1 % injection 5 mL   5 mL  Intradermal  PRN  Lucrezia Starch, MD         .  lidocaine-prilocaine (EMLA) cream 1 application   1 application  Topical  PRN  Lucrezia Starch, MD         .  metoprolol (LOPRESSOR) injection 5 mg   5 mg  Intravenous  Q6H PRN  Rakesh V. Elsworth Soho, MD     2.5 mg at 02/13/11 1145   .  multivitamin (RENA-VIT) tablet 1 tablet   1 tablet  Oral  Daily  Myriam Jacobson, PA     1 tablet at 02/21/11 [phone]   .  ondansetron (ZOFRAN) tablet 4 mg   4 mg  Oral  Q6H PRN  Harl Bowie, MD         .  oxyCODONE-acetaminophen  (PERCOCET) 5-325 MG per tablet 2 tablet   2 tablet  Oral  Q4H PRN  Estanislado Emms, MD     2 tablet at 02/21/11 1948   .  oxyCODONE-acetaminophen (PERCOCET) 5-325 MG per tablet                  .  pantoprazole (PROTONIX) injection 40 mg   40 mg  Intravenous  Q12H  Simonne Maffucci, MD     40 mg at 02/22/11 0027   .  pentafluoroprop-tetrafluoroeth (GEBAUERS) aerosol 1 application   1 application  Topical  PRN  Lucrezia Starch, MD         .  piperacillin-tazobactam (ZOSYN) IVPB 2.25 g   2.25 g  Intravenous  Q8H  Sandford Craze, PHARMD     2.25 g at 02/22/11 0028   .  sodium chloride 0.9 % injection                  .  sodium chloride 0.9 % injection                  .  sodium chloride 0.9 % injection                  .  sodium chloride 0.9 % injection                  .  sodium chloride 0.9 % injection                  .  sodium chloride 0.9 % injection              10 mL at 02/19/11 0414   .  sodium chloride 0.9 % injection                  .  sodium chloride 0.9 % injection                  .  sodium chloride 0.9 % injection                  .  sodium chloride 0.9 % injection                  .  sodium chloride 0.9 % injection              10 mL at 02/20/11 2218   .  vancomycin (VANCOCIN) 750 mg in sodium chloride 0.9 % 150 mL IVPB   750 mg  Intravenous  Q T,Th,Sa-HD  Rande Lawman Rumbarger, PHARMD     750 mg at 02/21/11 1546   .  DISCONTD: 0.9 %  sodium chloride infusion       Continuous PRN  Valetta Fuller         .  DISCONTD: 0.9 %  sodium chloride infusion       Continuous PRN  Valetta Fuller         .  DISCONTD: 0.9 %  sodium chloride infusion     Intravenous  Continuous  Janeece Riggers, MD         .  DISCONTD: 0.9 %  sodium chloride infusion   100 mL  Intravenous  PRN  Lucrezia Starch, MD         .  DISCONTD: 0.9 %  sodium chloride infusion   100 mL  Intravenous  PRN  Lucrezia Starch, MD         .  DISCONTD: 0.9 %  sodium chloride infusion     Intravenous  Continuous   Darlina Sicilian, NP  50 mL/hr at 02/14/11 1200      .  DISCONTD: acetaminophen (TYLENOL) suppository 650 mg   650 mg  Rectal  Q4H PRN  Lavon Paganini. [place], PA         .  DISCONTD: acetaminophen (TYLENOL) tablet 650 mg   650 mg  Oral  Q4H PRN  Lavon Paganini. [place], PA     650 mg at 02/08/11 2043   .  DISCONTD: acetaminophen (TYLENOL) tablet 650 mg   650 mg  Oral  Q6H PRN  Rakesh V. Elsworth Soho, MD     650 mg at 02/12/11 1709   .  DISCONTD: acetaminophen (TYLENOL) tablet 650 mg   650 mg  Oral  Q6H PRN  Kellie A Goldsborough     650 mg at 02/17/11 2341   .  DISCONTD: albumin human 5 % solution                  .  DISCONTD: ALPRAZolam Duanne Moron) tablet 0.125 mg   0.125 mg  Oral  Q8H PRN  Lavon Paganini. [place], PA     0.125 mg at 02/08/11 2043   .  DISCONTD: alum & mag hydroxide-simeth (MAALOX PLUS) 400-400-40 MG/5ML suspension 30 mL   30 mL  Oral  Q6H PRN  Lavon Paganini. [place], PA     30 mL at 02/06/11 2125   .  DISCONTD: calcium carbonate (TUMS - dosed in mg elemental calcium) chewable tablet 200-400 mg of elemental calcium   1-2 tablet  Oral  TID WC  Meredith Staggers, MD     400 mg of elemental calcium at 02/09/11 1231   .  DISCONTD: cefTAZidime (FORTAZ) 2 g in dextrose 5 % 50 mL IVPB   2 g  Intravenous  Q M,W,F-HD  Crystal Avanell Shackleton, PHARMD         .  DISCONTD: cefTAZidime (FORTAZ) 2 g in dextrose 5 % 50 mL IVPB   2 g  Intravenous  Q T,Th,Sa-HD  Crystal Stillinger Alford Highland, PHARMD         .  DISCONTD: darbepoetin (ARANESP) injection 200 mcg   200 mcg  Intravenous  Q Sat-HD  Ramiro Harvest, PA     200 mcg at 02/02/11 2019   .  DISCONTD: dextrose 5 %-0.9 % sodium chloride infusion     Intravenous  Continuous  Stark Klein, MD  40 mL/hr at 02/13/11 0310      .  DISCONTD: diphenhydrAMINE (BENADRYL) 12.5 MG/5ML elixir 12.5 mg   12.5 mg  Oral  Q6H PRN  Harl Bowie, MD     12.5 mg at 02/11/11 2219   .  DISCONTD: diphenhydrAMINE (BENADRYL) 12.5 MG/5ML elixir 12.5 mg   12.5 mg  Oral  Q6H PRN  Shann Medal,  MD         .  DISCONTD: diphenhydrAMINE (BENADRYL) 12.5 MG/5ML elixir 12.5 mg   12.5 mg  Oral  Q6H PRN  Shann Medal, MD         .  DISCONTD: diphenhydrAMINE (BENADRYL) injection 12.5 mg   12.5 mg  Intravenous  Q6H PRN  Harl Bowie, MD         .  DISCONTD: diphenhydrAMINE (BENADRYL) injection 12.5 mg   12.5 mg  Intravenous  Q6H PRN  Shann Medal, MD         .  DISCONTD: diphenhydrAMINE (BENADRYL) injection 12.5 mg   12.5 mg  Intravenous  Q6H PRN  Shann Medal, MD         .  DISCONTD: enoxaparin (LOVENOX) injection 30 mg   30 mg  Subcutaneous  Q24H  Thuy Dien Dang, PHARMD     30 mg at 02/08/11 1806   .  DISCONTD: enoxaparin (LOVENOX) injection 30 mg   30 mg  Subcutaneous  Q24H  Lavonia Drafts [place], PHARMD     30 mg at 02/13/11 1642   .  DISCONTD: enoxaparin (LOVENOX) injection 40 mg   40 mg  Subcutaneous  Q24H  Harl Bowie, MD         .  DISCONTD: feeding supplement (NEPRO CARB  STEADY) liquid 237 mL   237 mL  Oral  TID PRN  Lavon Paganini. [place], PA     237 mL at 02/05/11 1719   .  DISCONTD: feeding supplement (NEPRO CARB STEADY) liquid 237 mL   237 mL  Oral  BID BM  Myriam Jacobson, PA     237 mL at 02/05/11 0910   .  DISCONTD: fentaNYL (SUBLIMAZE) injection       PRN  Valetta Fuller     50 mcg at 02/09/11 1630   .  DISCONTD: fentaNYL NICU IV Syringe 50 mcg/mL       PRN  Missy Sabins, MD     25 mcg at 02/19/11 1456   .  DISCONTD: glycopyrrolate (ROBINUL) injection       PRN  Valetta Fuller     0.8 mg at 02/09/11 1650   .  DISCONTD: guaiFENesin-dextromethorphan (ROBITUSSIN DM) 100-10 MG/5ML syrup 5-10 mL   5-10 mL  Oral  Q6H PRN  Lavon Paganini. [place], PA         .  DISCONTD: heparin injection 1,200 Units   20 Units/kg  Dialysis  PRN  Donetta Potts, MD     1,200 Units at 02/06/11 1630   .  DISCONTD: HYDROcodone-acetaminophen (NORCO) 5-325 MG per tablet 1 tablet   1 tablet  Oral  Q6H PRN  Nyoka Cowden     1 tablet at 02/08/11 [id number]   .  DISCONTD: HYDROmorphone  (DILAUDID) injection 0.25-0.5 mg   0.25-0.5 mg  Intravenous  Q5 min PRN  Janeece Riggers, MD     0.5 mg at 02/09/11 1847   .  DISCONTD: menthol-cetylpyridinium (CEPACOL) lozenge 3 mg   1 lozenge  Oral  PRN  Lavon Paganini. [place], [place]         .  DISCONTD: methocarbamol (ROBAXIN) tablet 500 mg   500 mg  Oral  Q6H PRN  Lavon Paganini. [place], [place]     500 mg at 02/09/11 0547   .  DISCONTD: metoCLOPramide (REGLAN) injection 5 mg   5 mg  Intravenous  Q6H  Charlett Blake, MD     5 mg at 02/09/11 1232   .  DISCONTD: midazolam (VERSED) 5 MG/5ML injection       PRN  Missy Sabins, MD     2 mg at 02/19/11 1456   .  DISCONTD: midazolam (VERSED) injection 0.5-2 mg   0.5-2 mg  Intravenous  Once PRN  Janeece Riggers, MD         .  DISCONTD: morphine 1 MG/ML PCA injection     Intravenous  Q4H  Harl Bowie, MD     4 mg at 02/12/11 1500   .  DISCONTD: morphine 1 MG/ML PCA injection     Intravenous  Q4H  Shann Medal, MD         .  DISCONTD: morphine 1 MG/ML PCA injection     Intravenous  Q4H  Shann Medal, MD     11 mg at 02/19/11 0800   .  DISCONTD: multivitamin (RENA-VIT) tablet 1 tablet   1 tablet  Oral  QHS  Placido Sou, MD     1 tablet at 02/08/11 2346   .  DISCONTD: naloxone (NARCAN) injection 0.4 mg   0.4 mg  Intravenous  PRN  Harl Bowie, MD         .  DISCONTD: naloxone [facility]) injection 0.4  mg   0.4 mg  Intravenous  PRN  Shann Medal, MD         .  DISCONTD: naloxone [facility]) injection 0.4 mg   0.4 mg  Intravenous  PRN  Shann Medal, MD         .  DISCONTD: neostigmine (PROSTIGMINE) injection     Intravenous  PRN  Valetta Fuller     5 mg at 02/09/11 1650   .  DISCONTD: ondansetron (ZOFRAN) injection 4 mg   4 mg  Intravenous  Once PRN  Janeece Riggers, MD         .  DISCONTD: ondansetron [facility]) injection 4 mg   4 mg  Intravenous  Q6H PRN  Harl Bowie, MD         .  DISCONTD: ondansetron (ZOFRAN) injection 4 mg   4 mg  Intravenous  Q6H PRN  Harl Bowie, MD          .  DISCONTD: ondansetron (ZOFRAN) injection 4 mg   4 mg  Intravenous  Q6H PRN  Shann Medal, MD         .  DISCONTD: ondansetron [facility]) injection       PRN  Valetta Fuller     4 mg at 02/09/11 1656   .  DISCONTD: oxyCODONE-acetaminophen (PERCOCET) 5-325 MG per tablet 2 tablet   2 tablet  Oral  Q8H PRN  Kellie A Goldsborough     2 tablet at 02/20/11 0732   .  DISCONTD: oxyCODONE-acetaminophen (PERCOCET) 5-325 MG per tablet                  .  DISCONTD: pantoprazole (PROTONIX) injection 40 mg   40 mg  Intravenous  Q12H  Charlett Blake, MD     40 mg at 02/09/11 0854   .  DISCONTD: phenol (CHLORASEPTIC) mouth spray 1 spray   1 spray  Mouth/Throat  PRN  Lavon Paganini. [place], PA         .  DISCONTD: phenylephrine (NEO-SYNEPHRINE) injection       PRN  Valetta Fuller     80 mcg at 02/09/11 1635   .  DISCONTD: piperacillin-tazobactam (ZOSYN) IVPB 2.25 g   2.25 g  Intravenous  Q8H  Crystal [place], [place]         .  DISCONTD: piperacillin-tazobactam (ZOSYN) IVPB 2.25 g   2.25 g  Intravenous  Q8H  Benjamine Sprague Markle, PHARMD     2.25 g at 02/09/11 1126   .  DISCONTD: piperacillin-tazobactam (ZOSYN) IVPB 3.375 g   3.375 g  Intravenous  Q8H  Harl Bowie, MD         .  DISCONTD: polyethylene glycol (MIRALAX / GLYCOLAX) packet 17 g   17 g  Oral  Daily  Meredith Staggers, MD     17 g at 02/09/11 0804   .  DISCONTD: promethazine (PHENERGAN) suppository 25 mg   25 mg  Rectal  BID PRN  Lavon Paganini. [place], PA         .  DISCONTD: promethazine (PHENERGAN) tablet 12.5 mg   12.5 mg  Oral  Q6H PRN  Lavon Paganini. [place], PA     12.5 mg at 02/01/11 1906   .  DISCONTD: propofol (DIPRIVAN) 10 MG/ML infusion       PRN  Valetta Fuller     140 mg at 02/09/11 1536   .  DISCONTD:  rocuronium (ZEMURON) injection       PRN  Valetta Fuller     50 mg at 02/09/11 1540   .  DISCONTD: senna-docusate (Senokot-S) tablet 2 tablet   2 tablet  Oral  QHS  Lavon Paganini. [place], [place]     2 tablet at 02/07/11 2051   .   DISCONTD: senna-docusate (Senokot-S) tablet 3 tablet   3 tablet  Oral  BID  Charlett Blake, MD     3 tablet at 02/09/11 0804   .  DISCONTD: sodium chloride 0.9 % injection 9 mL   9 mL  Intravenous  PRN  Harl Bowie, MD         .  DISCONTD: sodium chloride 0.9 % injection 9 mL   9 mL  Intravenous  PRN  Shann Medal, MD         .  DISCONTD: sodium chloride 0.9 % injection 9 mL   9 mL  Intravenous  PRN  Shann Medal, MD         .  DISCONTD: sodium chloride irrigation 0.9 %       PRN  Harl Bowie, MD     5,000 mL at 02/09/11 1607   .  DISCONTD: sorbitol 70 % solution 30 mL   30 mL  Oral  Q12H PRN  Walker Kehr, MD     30 mL at 02/07/11 1214   .  DISCONTD: succinylcholine (ANECTINE) injection       PRN  Valetta Fuller     100 mg at 02/09/11 1536   .  DISCONTD: traMADol (ULTRAM) tablet 50 mg   50 mg  Oral  Q6H  Charlett Blake, MD     50 mg at 02/09/11 0547   .  DISCONTD: vancomycin (VANCOCIN) 500 mg in sodium chloride 0.9 % 100 mL IVPB   500 mg  Intravenous  Q M,W,F-HD  Crystal Avanell Shackleton, PHARMD         .  DISCONTD: vancomycin (VANCOCIN) 500 mg in sodium chloride 0.9 % 100 mL IVPB   500 mg  Intravenous  Q T,Th,Sa-HD  Crystal Stillinger Alford Highland, PHARMD         .  DISCONTD: vancomycin (VANCOCIN) 500 mg in sodium chloride 0.9 % 100 mL IVPB   500 mg  Intravenous  Q T,Th,Sa-HD  Sandford Craze, PHARMD         .  DISCONTD: vancomycin (VANCOCIN) 500 mg in sodium chloride 0.9 % 100 mL IVPB   500 mg  Intravenous  Q hemodialysis  Stark Klein, MD     500 mg at 02/16/11 1110   .  DISCONTD: vancomycin (VANCOCIN) IVPB 1000 mg/200 mL premix   1,000 mg  Intravenous  Once  Stark Klein, MD             Medications Prior to Admission   Medication  Sig  Dispense  Refill   .  calcium carbonate (TUMS - DOSED IN MG ELEMENTAL CALCIUM) 500 MG chewable tablet  Chew 1-2 tablets by mouth 3 (three) times daily.           .  hydrOXYzine (ATARAX/VISTARIL) 25 MG tablet  Take 25 mg by  mouth every 6 (six) hours as needed. For itching            Home: Home Living Lives With: Alone Receives Help From: Friend(s);Family Type of Home: House Home Layout: One level Home Access: Stairs to enter Entrance Stairs-Rails: Right  Entrance Stairs-Number of Steps: 2 Bathroom Shower/Tub: Chiropodist: Standard Home Adaptive Equipment: Wheelchair - manual;Walker - rolling;Bedside commode/3-in-1 Additional Comments: Question whether patient is accurate historian given meds    Functional History: Prior Function Level of Independence: Independent with basic ADLs;Independent with homemaking with ambulation;Independent with transfers Driving: Yes   Functional Status:   Mobility: Bed Mobility Bed Mobility: Yes Rolling Right: 4: Min assist;With rail Rolling Right Details (indicate cue type and reason): see PT comments Rolling Left: 4: Min assist;With rail Rolling Left Details (indicate cue type and reason): see PT notes for details Left Sidelying to Sit: 3: Mod assist Left Sidelying to Sit Details (indicate cue type and reason): see PT note for details Sit to Supine - Left: 3: Mod assist;HOB flat Sit to Supine - Left Details (indicate cue type and reason): see PT note for details Transfers Transfers: Yes Sit to Stand: 1: +2 Total assist;Patient percentage (comment);From bed;From chair/3-in-1;With upper extremity assist Sit to Stand Details (indicate cue type and reason): See PT note Stand to Sit: 1: +2 Total assist;Patient percentage (comment);To chair/3-in-1;To bed;With upper extremity assist Stand to Sit Details: see PT note Stand Pivot Transfers - DO NOT USE: 3: Mod assist Stand Pivot Transfer Details (indicate cue type and reason): verbal cues for postural control as pt. placing weight on heels for transfer with RW. Needed asssit to move LEs and guide RW.    Squat Pivot Transfers: Not tested (comment) Ambulation/Gait Ambulation/Gait: Yes Ambulation/Gait  Assistance: 1: +2 Total assist;Patient percentage (comment) (+2 total assist (pt=60%)) Ambulation/Gait Assistance Details (indicate cue type and reason): Assist for balance with cues to extend hips and trunk.  Pt with narrow BOS and increased hip/knee flexion especially with fatigue.  Chair follow due to fatigue.  Cues to attempt heel strike bilaterally with gait. Ambulation Distance (Feet): 50 Feet (40 feet and then 10 feet) Assistive device: Rolling walker Gait Pattern: Decreased dorsiflexion - right;Decreased dorsiflexion - left;Right foot flat;Left foot flat;Right genu recurvatum;Left genu recurvatum;Trunk flexed Stairs: No Wheelchair Mobility Wheelchair Mobility: No Wheelchair Assistance: Not tested (comment)   ADL: ADL Eating/Feeding: Simulated;Set up Eating/Feeding Details (indicate cue type and reason): With drinking from cup Where Assessed - Eating/Feeding: Chair Grooming: Performed;Teeth care;Minimal assistance Grooming Details (indicate cue type and reason): Pt. required mod cues for back precautions.  Pt. stands with hips flexed, but attempts repetatively attempts to prop on elbows Where Assessed - Grooming: Standing at sink Upper Body Bathing: Not assessed Lower Body Bathing: Not assessed Upper Body Dressing: Performed;Minimal assistance Upper Body Dressing Details (indicate cue type and reason): With donning gown Where Assessed - Upper Body Dressing: Sitting, bed Lower Body Dressing: +1 Total assistance;Performed Lower Body Dressing Details (indicate cue type and reason): Pt. unable to reach bilateral feet to don socks Where Assessed - Lower Body Dressing: Sit to stand from bed Toilet Transfer: Simulated;+2 Total assistance Toilet Transfer Details (indicate cue type and reason): Pt. requires cues for upright posture and assist to stay inside walker Toilet Transfer Method: Ambulating Toilet Transfer Equipment: Comfort height toilet;Grab bars Toileting - Clothing  Manipulation: Simulated;+1 Total assistance Where Assessed - Camera operator Manipulation: Standing Toileting - Hygiene: Maximal assistance;Performed Where Assessed - Toileting Hygiene: Rolling right and/or left (Pt. incontinent large amount of bloody stool) Tub/Shower Transfer: Not assessed Tub/Shower Transfer Method: Not assessed Equipment Used: Rolling walker ADL Comments: Pt. motivated to participate, but does demonstrate impaired memory/recall of info.  Instructions for activity provided prior to performing, then when attempting to assit pt with  sit to stand she would state, "what are we doing?"   Cognition: Cognition Arousal/Alertness: Awake/alert Orientation Level: Oriented X4 Cognition Arousal/Alertness: Awake/alert Overall Cognitive Status: Impaired Orientation Level: Oriented X4 Following Commands: Follows one step commands inconsistently;Follows multi-step commands inconsistently;Follows one step commands with increased time;Follows multi-step commands with increased time Safety/Judgement: Decreased awareness of safety precautions;Decreased safety judgement for tasks assessed Decreased Safety/Judgement: Decreased awareness of need for assistance Safety/Judgement - Other Comments: decreased cognition questionably secondary to medications     Blood pressure 117/72, pulse 106, temperature 98.2 F (36.8 C), temperature source Oral, resp. rate 16, height 5\' 5"  (1.651 m), weight 57.1 kg (125 lb 14.1 oz), SpO2 100.00%. Physical Exam  Constitutional: She is oriented to person, place, and time. She appears well-developed.  HENT:   Head: Normocephalic.  Neck: Normal range of motion. Neck supple. No thyromegaly present.  Cardiovascular: Slightly tachycardic and regular rhythm with systolic murmur  Pulmonary/Chest: Effort normal. She has no wheezes.  Abdominal: She exhibits no distension. There is no guarding. Abdominal wound clean and intact      Ileostomy site with wafer  intact.  Musculoskeletal: She exhibits 1+ edema distal limbs.  Neurological: She is alert and oriented to person, place, and time. Patient with 4+ out of 5 upper extremity strength. Lower extremity strength grossly 2-3/5 proximally. She is 3/5 at the knees 3/5 ankle plantar flexion 0 to trace out of 5 ankle dorsi flexion. Sensory exam shows minimal management and lower extremities. Reflexes are 1+ in both legs. Skin:       Back incision well healed. Sacral wound with a 3 cm decubitus ulcer. It's covered with barrier cream and Allevyn dressing over top. The wound itself appears to be pink and granulating. Psychiatric: She has a normal mood and affect.     Results for orders placed during the hospital encounter of 02/09/11 (from the past 48 hour(s))   RENAL FUNCTION PANEL     Status: Abnormal     Collection Time     02/21/11  1:30 PM       Component  Value  Range  Comment     Sodium  135   135 - 145 (mEq/L)       Potassium  3.9   3.5 - 5.1 (mEq/L)       Chloride  98   96 - 112 (mEq/L)       CO2  24   19 - 32 (mEq/L)       Glucose, Bld  92   70 - 99 (mg/dL)       BUN  21   6 - 23 (mg/dL)       Creatinine, Ser  5.44 (*)  0.50 - 1.10 (mg/dL)       Calcium  7.0 (*)  8.4 - 10.5 (mg/dL)       Phosphorus  3.8   2.3 - 4.6 (mg/dL)       Albumin  1.5 (*)  3.5 - 5.2 (g/dL)       GFR calc non Af Amer  8 (*)  >90 (mL/min)       GFR calc Af Amer  9 (*)  >90 (mL/min)     CBC     Status: Abnormal     Collection Time     02/21/11  1:30 PM       Component  Value  Range  Comment     WBC  11.2 (*)  4.0 - 10.5 (K/uL)  RBC  3.21 (*)  3.87 - 5.11 (MIL/uL)       Hemoglobin  9.3 (*)  12.0 - 15.0 (g/dL)       HCT  28.1 (*)  36.0 - 46.0 (%)       MCV  87.5   78.0 - 100.0 (fL)       MCH  29.0   26.0 - 34.0 (pg)       MCHC  33.1   30.0 - 36.0 (g/dL)       RDW  15.9 (*)  11.5 - 15.5 (%)       Platelets  467 (*)  150 - 400 (K/uL)     PROTIME-INR     Status: Abnormal     Collection Time     02/21/11  1:30 PM        Component  Value  Range  Comment     Prothrombin Time  17.4 (*)  11.6 - 15.2 (seconds)       INR  1.40   0.00 - 1.49      APTT     Status: Abnormal     Collection Time     02/21/11  1:30 PM       Component  Value  Range  Comment     aPTT  44 (*)  24 - 37 (seconds)     GLUCOSE, CAPILLARY     Status: Abnormal     Collection Time     02/22/11  7:59 AM       Component  Value  Range  Comment     Glucose-Capillary  67 (*)  70 - 99 (mg/dL)      No results found.   Post Admission Physician Evaluation: Functional deficits secondary  to lumbar stenosis with radiculopathy, deconditioning after lami and colectomy. Patient is admitted to receive collaborative, interdisciplinary care between the physiatrist, rehab nursing staff, and therapy team. Patient's level of medical complexity and substantial therapy needs in context of that medical necessity cannot be provided at a lesser intensity of care such as a SNF. Patient has experienced substantial functional loss from his/her baseline which was documented above under the "Functional History" and "Functional Status" headings.  Judging by the patient's diagnosis, physical exam, and functional history, the patient has potential for functional progress which will result in measurable gains while on inpatient rehab.  These gains will be of substantial and practical use upon discharge  in facilitating mobility and self-care at the household level. Physiatrist will provide 24 hour management of medical needs as well as oversight of the therapy plan/treatment and provide guidance as appropriate regarding the interaction of the two. 24 hour rehab nursing will assist with bowel management, skin/wound care, disease management, medication administration and pain management  and help integrate therapy concepts, techniques,education, etc. PT will assess and treat for: balance, endurance, locomotion and transferring. Goals are: independent with assistive device to  minimal assistance OT will assess and treat for: balance, dressing, locomotion and strength .  Goals are: independent with assistive device to minimal assistance SLP will assess and treat for: not applicable Case Management and Social Worker will assess and treat for psychological issues and discharge planning. Team conference will be held weekly to assess progress toward goals and to determine barriers to discharge. Patient will receive at least 3 hours of therapy per day at least 5 days per week. ELOS and Prognosis: 2-3 weeks excellent     Medical Problem List and Plan: 1.Lumbar stenosis  with radiculopathy-S/P laminectomy Lumbar  4-5 and L 5 S1 11/14.Back corset when out of bed.Routine back precautions. Consider b/l AFO's.  Has AFO's in bed  2. DVT Prophylaxis/Anticoagulation: SCD,s/ and support hose.Monitor for any signs of DVT.  3. Pain Management: percocet prn.Monitor with increased activity.  Will schedule a percocet with am and afternoon therapies.  4.Ischemic Colitis-S/P exp lap with right hemicolectomy and ileostomy 11/24.All antibiotics discontinued.Monitor wound site.Provide ileostomy care and education.  She is doing some ostomy care already on her own.  5.ESRD-dialysis as directed per renal services.Weigh patient daily.Follow up labs with dialysis.  6Chronic anemia-aranesp with HD.Follow up CBC with HD and transfuse if symptomatic.  7.HTN-No present meds and monitor with increased activity.  3. Mood: ego support.  She seems to be in great spirits and quite motivated.  Will follow closely.

## 2011-02-25 NOTE — Progress Notes (Signed)
Pt admitted to rehab today into room 4006. Pt oriented to bed controls and call light. Pt viewed safety video and aware of safety plan. Pt aware of rehab routines and meal times and therapy schedule, team conference and .lead nurse, etc. Butterfly wish and preferred name reviewed and posted at [facility]. Please see [facility] for review of head to toe assessment details. Will continue to monitor. Roberts-VonCannon, Lexine Jaspers Selinda Eon

## 2011-02-25 NOTE — Progress Notes (Signed)
PT/OT/SLP Cancellation and Discharge Note  Treatment cancelled today due to pt discharged to CIR.  Acute PT signing off.  Penelopi Mikrut L. Tyeisha Dinan DPT [phone] 02/25/2011

## 2011-02-25 NOTE — Progress Notes (Signed)
Subjective:  Eating brk."i need to do some moving" Objective Vital signs in last 24 hours: Filed Vitals:   02/24/11 1300 02/24/11 1700 02/24/11 2228 02/25/11 0620  BP: 132/75 143/87 121/74 153/53  Pulse: 116 110 96 108  Temp: 98.1 F (36.7 C) 98.4 F (36.9 C) 98 F (36.7 C) 97.9 F (36.6 C)  TempSrc: Oral Oral Oral Oral  Resp: 19 19 18 18   Height:      Weight:   56.4 kg (124 lb 5.4 oz)   SpO2: 98% 98% 97% 94%   Weight change: -1.7 kg (-3 lb 12 oz)  Intake/Output Summary (Last 24 hours) at 02/25/11 0841 Last data filed at 02/25/11 0700  Gross per 24 hour  Intake    900 ml  Output    250 ml  Net    650 ml   Labs: Basic Metabolic Panel:  Lab [id number] 0742 02/21/11 1330 02/20/11 0500 02/19/11 0900  NA 138 135 136 --  K 4.5 3.9 3.8 --  CL 101 98 99 --  CO2 24 24 26  --  GLUCOSE 75 92 68* --  BUN 21 21 16  --  CREATININE 6.44* 5.44* 3.88* --  CALCIUM 8.5 7.0* 7.2* --  ALB -- -- -- --  PHOS 3.1 3.8 -- 4.3   Liver Function Tests:  Lab 02/24/11 0742 02/21/11 1330 02/19/11 0900  AST -- -- --  ALT -- -- --  ALKPHOS -- -- --  BILITOT -- -- --  PROT -- -- --  ALBUMIN 1.6* 1.5* 1.4*   No results found for this basename: LIPASE:3,AMYLASE:3 in the last 168 hours No results found for this basename: AMMONIA:3 in the last 168 hours CBC:  Lab 02/24/11 0742 02/21/11 1330 02/20/11 0500 02/19/11 0414 02/18/11 1300  WBC 13.7* 11.2* 15.7* -- --  NEUTROABS -- -- -- -- --  HGB 9.8* 9.3* 9.1* -- --  HCT 31.4* 28.1* 27.3* -- --  MCV 92.1 87.5 86.4 89.2 88.7  PLT 611* 467* 365 -- --   Cardiac Enzymes: No results found for this basename: CKTOTAL:5,CKMB:5,CKMBINDEX:5,TROPONINI:5 in the last 168 hours CBG:  Lab 02/22/11 1644 02/22/11 1124 02/22/11 1019 02/22/11 0759  GLUCAP 111* 128* 103* 67*    Iron Studies:  Basename 02/24/11 0742  IRON 18*  TIBC 99*  TRANSFERRIN --  FERRITIN --   Studies/Results: No results found. Medications:      . calcium carbonate  400 mg of  elemental calcium Oral TID WC  . darbepoetin (ARANESP) injection - DIALYSIS  200 mcg Intravenous Q Tue-HD  . feeding supplement (NEPRO CARB STEADY)  237 mL Oral TID WC  . feeding supplement (NEPRO CARB STEADY)  237 mL Oral TID WC  . megestrol  200 mg Oral BID  . multivitamin  1 tablet Oral Daily  . oxyCODONE-acetaminophen      . pantoprazole  40 mg Oral BID AC   I  have reviewed scheduled and prn medications.  Physical Exam: General:nad Heart:RRR, 2/6 sem LSB  Lungs:CTA Abdomen:bs pos. Colostomy bag in place with stool Extremities: Dialysis Access:  pos. Bruit rt upper arm, left doral ft. 2+ edema , RT 1+ edema dorsal   Problem/Plan: 1.Lumbar stenosis with radiculopathy-S/P laminectomy; needs additional rehab services.  2.Ischemic Colitis-S/P right hemicolectomy and ileostomy 11/24. Now off all antibiotics. Daily PPI; Wound care nurse following. And [facility] plans 3.ESRD-TTS [place];next tx Tu  4. Anemia of Renal Failure-Hgb 9.8  Max Aranesp; appears stable; T-Sat 20% 11/26 with high Ferritin (2404); no IV iron;  will recheck iron stores  5. Secondary hyperparathyroidism - phos good; Tums with meals; no vitamin D; follow closely secondary poor appetite  6. HTN-controlled; however, some extra volume,UF as Tol  With HD 7. Nutrition - severe protein/calorie malnutrition with albumin 1.5 02/20/11; appetite poor, has Nepro; add Megace with RD to evaluate  8. Deconditioning- prolonged hospitalization- PT/OT/ Rehab Today????  9. Disposition- readmission to inpatient rehab   -   Ernest Haber, PA-C [facility] [phone] 02/25/2011,8:41 AM  LOS: 16 days \ Pt examined and history updated.  Agree with above.  For discharge to rehab today in stable condition. Chelcy Bolda C

## 2011-02-25 NOTE — Plan of Care (Signed)
Overall Plan of Care [facility]) Patient Details Name: [name] MRN: [id number] DOB: [birth year]  Diagnosis:  Lumbar stenosis with radiculopathy.  Deconditioning  Primary Diagnosis:    <principal problem not specified> Co-morbidities: ESRD, ischemic bowel with ostomy. Bilateral foot drop  Functional Problem List  Patient demonstrates impairments in the following areas: Bowel, Endurance, Medication Management, Pain, Safety and Skin Integrity  Basic ADL's: bathing and dressing Advanced ADL's: simple meal preparation  Transfers:  bed mobility, bed to chair, toilet, car, furniture and floor Locomotion:  ambulation, wheelchair mobility and stairs  Additional Impairments:  Other  Anticipated Outcomes Item Anticipated Outcome  Eating/Swallowing  Independence after set up  Basic self-care  Supervision  Tolieting  n/a  Bowel/Bladder  Independent with colostomy care  Transfers    Supervision  Locomotion    Mod I WC mobility, Min A ambulation   Communication    Cognition    Pain  5-7 or less on scale of 1-10  Safety/Judgment  supervision  Other     Therapy Plan:         Team Interventions: Item RN PT OT SLP SW TR Other  Self Care/Advanced ADL Retraining  x x      Neuromuscular Re-Education  x x      Therapeutic Activities  x x   x   UE/LE Strength Training/ROM  x x   x   UE/LE Coordination Activities  x x   x   Visual/Perceptual Remediation/Compensation         DME/Adaptive Equipment Instruction  x x   x   Therapeutic Exercise  x x   x   Balance/Vestibular Training  x x   x   Patient/Family Education x x x   x   Cognitive Remediation/Compensation         Functional Mobility Training  x x   x   Ambulation/Gait Training  x       Stair Training  x       Wheelchair Propulsion/Positioning  x x   x   Functional Electrical Stimulation  x       Community Reintegration  x x   x   Dysphagia/Aspiration Environmental consultant           Bladder Management         Bowel Management x        Disease Management/Prevention x  x      Pain Management x x x      Medication Management x        Skin Care/Wound Management x x x      Splinting/Orthotics  x x      Discharge Planning  x x  x x   Psychosocial Support     x x                      Team Discharge Planning: Destination:  Home Projected Follow-up:  PT, Home Health and OT: TBD Projected Equipment Needs:  Research officer, political party involved in discharge planning:  No family available; patient able  MD ELOS: 11 days Medical Rehab Prognosis:  Excellent Assessment: Patient has been readmitted to CIR after complications related to ischemic bowel.  She has ongoing bilateral footdrop.  Mobility goals will be set at a wheelhchair level predominantly.  May be a candidate for custom AFO's depending upon the amount of progress she makes.  Goals will be at  a supervision level with OT and PT.  RN is working with patient on independence with ostomy care.

## 2011-02-25 NOTE — Progress Notes (Signed)
Patient ID: [name], female   DOB: 1950-12-24, 60 y.o.   MRN: [id number] 6 Days Post-Op  Subjective: Pt feels ok.  C/o leg pain in her thighs.  Tolerating regular diet.  No abdominal complaints.  Objective: Vital signs in last 24 hours: Temp:  [97.9 F (36.6 C)-98.4 F (36.9 C)] 97.9 F (36.6 C) (12/10 0620) Pulse Rate:  [96-125] 108  (12/10 0620) Resp:  [17-30] 18  (12/10 0620) BP: (119-153)/(53-96) 153/53 mmHg (12/10 0620) SpO2:  [94 %-98 %] 94 % (12/10 0620) Weight:  [121 lb 14.6 oz (55.3 kg)-124 lb 5.4 oz (56.4 kg)] 124 lb 5.4 oz (56.4 kg) (12/09 2228) Last BM Date: 02/24/11  Intake/Output from previous day: 12/09 0701 - 12/10 0700 In: 900 [P.O.:900] Out: 250 [Stool:250] Intake/Output this shift:    PE: Abd: soft, NT, ND, +BS, ostomy is functioning with air and stool, incision is c/d/i with some steri strips and some staples.  Lab Results:   [facility] 02/24/11 0742  WBC 13.7*  HGB 9.8*  HCT 31.4*  PLT 611*   BMET  Basename 02/24/11 0742  NA 138  K 4.5  CL 101  CO2 24  GLUCOSE 75  BUN 21  CREATININE 6.44*  CALCIUM 8.5   PT/INR No results found for this basename: LABPROT:2,INR:2 in the last 72 hours   Studies/Results: No results found.  Anti-infectives: Anti-infectives     Start     Dose/Rate Route Frequency Ordered Stop   02/19/11 1200   vancomycin (VANCOCIN) 750 mg in sodium chloride 0.9 % 150 mL IVPB  Status:  Discontinued        750 mg 150 mL/hr over 60 Minutes Intravenous Every T-Th-Sa (Hemodialysis) 02/19/11 0836 02/22/11 1236   02/14/11 1200   vancomycin (VANCOCIN) 500 mg in sodium chloride 0.9 % 100 mL IVPB  Status:  Discontinued        500 mg 100 mL/hr over 60 Minutes Intravenous Every Hemodialysis 02/11/11 1122 02/19/11 0835   02/13/11 1700   fluconazole (DIFLUCAN) IVPB 200 mg  Status:  Discontinued        200 mg 100 mL/hr over 60 Minutes Intravenous Every 48 hours 02/13/11 1642 02/22/11 1236   02/12/11 1200   vancomycin  (VANCOCIN) 500 mg in sodium chloride 0.9 % 100 mL IVPB  Status:  Discontinued        500 mg 100 mL/hr over 60 Minutes Intravenous Every T-Th-Sa (Hemodialysis) 02/09/11 2050 02/11/11 1120   02/11/11 1600   vancomycin (VANCOCIN) IVPB 1000 mg/200 mL premix  Status:  Discontinued        1,000 mg 200 mL/hr over 60 Minutes Intravenous  Once 02/11/11 1119 02/19/11 0835   02/09/11 2200   piperacillin-tazobactam (ZOSYN) IVPB 2.25 g  Status:  Discontinued        2.25 g 100 mL/hr over 30 Minutes Intravenous 3 times per day 02/09/11 2050 02/22/11 1236   02/09/11 2100   piperacillin-tazobactam (ZOSYN) IVPB 3.375 g  Status:  Discontinued        3.375 g 12.5 mL/hr over 240 Minutes Intravenous Every 8 hours 02/09/11 2034 02/09/11 2050           Assessment/Plan  1. S/p extended right hemicolectomy   Plan: Cont regular diet Will need to d/c the rest of her staples Cont WOC, RN teaching Otherwise, the patient is surgically stable.   LOS: 16 days    Guerin Lashomb E 02/25/2011\

## 2011-02-25 NOTE — Progress Notes (Signed)
Agree with above.  Doing well from surgical standpoint.  Discharge per medical team.

## 2011-02-25 NOTE — Progress Notes (Signed)
Noted patient medically ready to return to inpatient acute rehabilitation today per physicians involved over the weekend. Patient is in agreement and I plan to admit her today. Please call me with any questions pager [phone].

## 2011-02-26 DIAGNOSIS — M47817 Spondylosis without myelopathy or radiculopathy, lumbosacral region: Secondary | ICD-10-CM

## 2011-02-26 DIAGNOSIS — Z5189 Encounter for other specified aftercare: Secondary | ICD-10-CM

## 2011-02-26 DIAGNOSIS — IMO0002 Reserved for concepts with insufficient information to code with codable children: Secondary | ICD-10-CM

## 2011-02-26 DIAGNOSIS — N186 End stage renal disease: Secondary | ICD-10-CM

## 2011-02-26 NOTE — Progress Notes (Addendum)
[facility] Individual Statement of Services  Patient Name:  [name]  Date:  02/26/2011  Welcome to the [facility].  Our goal is to provide you with an individualized program based on your diagnosis and situation, designed to meet your specific needs.  With this comprehensive rehabilitation program, you will be expected to participate in at least 3 hours of rehabilitation therapies Monday-Friday, with modified therapy programming on the weekends.  Your rehabilitation program will include the following services:  Physical Therapy (PT), Occupational Therapy (OT), 24 hour per day rehabilitation nursing, Therapeutic Recreaction (TR), Case Management (RN and Education officer, museum), Rehabilitation Medicine, Nutrition Services and Pharmacy Services  Weekly team conferences will be held on  Tuesday  to discuss your progress.  Your RN Case Writer will talk with you frequently to get your input and to update you on team discussions.  Team conferences with you and your family in attendance may also be held.  Depending on your progress and recovery, your program may change.  Your RN Case Engineer, production will coordinate services and will keep you informed of any changes.  Your RN Tourist information centre manager and SW names and contact numbers are listed  below.  The following services may also be recommended but are not provided by the [facility] will be made to provide these services after discharge if needed.  Arrangements include referral to agencies that provide these services.  Your insurance has been verified to be:  Medicare + BCBS Your primary doctor is:  Dr. [facility].  Pertinent information will be shared with your doctor and your insurance company.  Case  Manager: Lutricia Feil, [facility] [phone]  Social Worker:  Lennart Pall, [place]  ELOS: about 10 days    Goals:  Supervision- [facility] chair level]  Information discussed with and copy given to patient by: Theora Master, 02/26/2011, 11:58 AM

## 2011-02-26 NOTE — Progress Notes (Signed)
Patient ID: [name], female   DOB: 04-05-1950, 60 y.o.   MRN: [id number] Subjective/Complaints: Review of Systems  Musculoskeletal: Positive for back pain.  All other systems reviewed and are negative.  did well last night. No real c/o   Objective: Vital Signs: Blood pressure 153/86, pulse 107, temperature 98.4 F (36.9 C), temperature source Oral, resp. rate 18, height 5\' 5"  (1.651 m), weight 56.1 kg (123 lb 10.9 oz), SpO2 96.00%. No results found.  Basename 02/24/11 0742  WBC 13.7*  HGB 9.8*  HCT 31.4*  PLT 611*    Basename 02/24/11 0742  NA 138  K 4.5  CL 101  CO2 24  GLUCOSE 75  BUN 21  CREATININE 6.44*  CALCIUM 8.5   CBG (last 3)  No results found for this basename: GLUCAP:3 in the last 72 hours  Wt Readings from Last 3 Encounters:  02/26/11 56.1 kg (123 lb 10.9 oz)  02/24/11 56.4 kg (124 lb 5.4 oz)  02/24/11 56.4 kg (124 lb 5.4 oz)    Physical Exam:  General appearance: alert, cooperative and no distress Head: Normocephalic, without obvious abnormality, atraumatic Eyes: conjunctivae/corneas clear. PERRL, EOM's intact. Fundi benign. Ears: normal TM's and external ear canals both ears Nose: Nares normal. Septum midline. Mucosa normal. No drainage or sinus tenderness. Throat: lips, mucosa, and tongue normal; teeth and gums normal Neck: no adenopathy, no carotid bruit, no JVD, supple, symmetrical, trachea midline and thyroid not enlarged, symmetric, no tenderness/mass/nodules Back: symmetric, no curvature. ROM normal. No CVA tenderness. Resp: clear to auscultation bilaterally Cardio: regular rate and rhythm, S1, S2 normal, no murmur, click, rub or gallop GI: soft, non-tender; bowel sounds normal; no masses,  no organomegaly Extremities: extremities normal, atraumatic, no cyanosis or edema slight edema distally only Pulses: 2+ and symmetric Skin: Skin color, texture, turgor normal. No rashes or lesions Neurologic: continued foot drop tr-1/5 but pt  denies sensory changes.  Heel cords loose Incision/Wound: wounds all clean and intact.  Ostomy site sealed and stool of normal consistency   Assessment/Plan: 1. Functional deficits secondary to lumbar stenosis with radiculopathy s/p decompression and ischemic colitis requiring colectomy which require 3+ hours per day of interdisciplinary therapy in a comprehensive inpatient rehab setting. Physiatrist is providing close team supervision and 24 hour management of active medical problems listed below. Physiatrist and rehab team continue to assess barriers to discharge/monitor patient progress toward functional and medical goals. Mobility:         ADL:   Cognition: Cognition Orientation Level: Oriented X4 Cognition Orientation Level: Oriented X4  1.Lumbar stenosis with radiculopathy-S/P laminectomy Lumbar 4-5 and L 5 S1 11/14.Back corset when out of bed.Routine back precautions. Consider b/l AFO's. Has AFO's in bed.  Will almost certainly need AFO's for gait.  2. DVT Prophylaxis/Anticoagulation: SCD,s/ and support hose.Monitor for any signs of DVT.   3. Pain Management: percocet prn.Monitor with increased activity. Will schedule a percocet with am and afternoon therapies. Doing very well with pain thi am.  4.Ischemic Colitis-S/P exp lap with right hemicolectomy and ileostomy 11/24.All antibiotics discontinued.Monitor wound site.Provide ileostomy care and education. She is doing some ostomy care already on her own. Follow WBC's closely.   5.ESRD-dialysis as directed per renal services.Weigh patient daily.Follow up labs with dialysis.   6Chronic anemia-aranesp with HD.Follow up CBC with HD and transfuse if symptomatic.   7.HTN-No present meds and monitor with increased activity.   3. Mood: ego support. She seems to be in great spirits and quite motivated. Will follow closely.  LOS (Days) 1 A FACE TO FACE VISIT WAS PERFORMED WITH THIS PATIENT  [name] 02/26/2011,  7:07 AM

## 2011-02-26 NOTE — Progress Notes (Signed)
Occupational Therapy Assessment and Plan & Session Note  Patient Details  Name: [name] MRN: [id number] Date of Birth: [birth year]  OT Diagnosis: acute pain, lumbago (low back pain) and muscle weakness (generalized) Rehab Potential: Rehab Potential: Good ELOS: ~10 days   Today's Date: 02/26/2011  Assessment & Plan Clinical Impression:This is a 60 year old right-handed black female with end-stage renal disease admitted November 14 with increased low back pain since August 2012 with progressive right foot drop. X-rays and imaging showed grade 2 dynamic degenerative spondylolisthesis of lumbar L4 and lumbar L5. MRI revealed severe multi-factorial spinal stenosis and radiculopathy at lumbar L4-5 and marked to severe multi-factorial stenosis at lumbar L5 and S1. Patient underwent lumbar L4-S1 lumbar laminectomy foraminotomies with decompression as well as lumbar 4 and 5 lumbar L5 and S1 posterior lumbar interbody arthrodesis on November 14 per Dr. Sherwood Gambler. She was fitted with a back brace to be worn when out of bed. Patient with noted history of end-stage renal disease with hemodialysis ongoing per renal services. She was admitted inpatient rehabilitation services on November 16 for comprehensive rehabilitation. She was progressing nicely with therapies requiring moderate assistance for overall mobility progressing nicely with endurance and strength. Patient developed some worsening abdominal pain around November 23 increasing tenderness to epigastrium and right lower quadrant. CT scan of the abdomen performed at November 23 demonstrated right and transverse colon wall thickening and pneumatosis. Patient had had 2-3 bowel movements over the previous 24 hours no blood or diarrhea noted. General surgery was consulted and seen by Dr. Nathaneil Canary a Ninfa Linden. Patient was discharged to acute care services underwent exploratory laparotomy with extended right hemicolectomy and ileostomy for ischemic bowel on  November 24 per Dr. Ninfa Linden. Maintained on vancomycin and Zosyn for Empiric coverage. Patient remained on hemodialysis scheduled Tuesdays Thursdays and Saturdays peripheral services. Noted anemia November 25 transfused 2 units of packed red blood cells for hemoglobin of 6.8. Diet was slowly progressed to a regular consistency which he tolerated well. She did have bright red blood from the rectum x2 with followup per surgery felt to be possibly due to heparin during dialysis and monitored hemoglobin did remain stable no further reports of blood noted. All antibiotics since been discontinued and latest white blood cell count of 11,000 hemoglobin 9.3 on 02/21/2011. Therapies are since been resumed she was requiring total assist of 2 to ambulate 50 feet with a rolling walker as well as when her back corset which could be applied in standing position. Patient is to be admitted back to inpatient rehabilitation services for ongoing comprehensive rehabilitation program. Patient transferred to CIR on 02/25/2011 .  Patient's past medical history is significant for: *Seizures  r/t HTN in 1990's x 1  . Brain aneurysm  . Blood transfusion 1990's  r/t Kidney removal surgery  . Arthritis  Back  . Anemia  . Umbilical hernia age 19  . Heart murmur  Born with heart mumur, does not require follow up per pt  . Hypertension  Does not see a heart doctor, had pre transplant stress test at [facility]  . Diabetes mellitus  Borderline  . End stage renal disease on dialysis  T/Th/Sat dialysis on [employer]  . End stage renal disease on dialysis  01-29-2011  . Hepatitis C   Patient currently requires supervision for UB ADLs & Total assist for LB ADLs secondary to muscle weakness.  Prior to hospitalization, patient could complete ADLs & IADLs independently and was even driving independently.   Patient will benefit from  skilled intervention to increase independence with basic self-care skills prior to discharge home  with daughter.  Anticipate patient will require 24 hour supervision and additional OT is TBD.   Secondary to recent Colostomy bag and Dialysis with no voids; no toileting or toilet transfer goals at this time. Overall goals set at a supervision level for LB ADLs and simple IADL tasks at w/c level; grooming goal set at an independent w/c level.   OT Assessment Rehab Potential: Good Barriers to Discharge:  (none at this time) OT Plan OT Frequency: 1-2 X/day, 60-90 minutes Estimated Length of Stay: ~10 days OT Treatment/Interventions: Balance/vestibular training;Community reintegration;DME/adaptive equipment instruction;Functional mobility training;Neuromuscular re-education;Pain management;Patient/family education;Self Care/advanced ADL retraining;Splinting/orthotics;Therapeutic Activities;Therapeutic Exercise;UE/LE Strength taining/ROM;UE/LE Coordination activities;Wheelchair propulsion/positioning OT Recommendation Follow Up Recommendations: Other (comment) (OT: TBD) Equipment Recommended:  (TBD)  Precautions/Restrictions  Precautions Precautions: Back Required Braces or Orthoses: Yes Spinal Brace: Lumbar corset  General Chart Reviewed: Yes  Pain Pain Assessment Pain Assessment: 0-10 Pain Score:   7 Pain Type: Acute pain Pain Location:  (LEs) Pain Radiating Towards: legs Pain Descriptors: Aching Pain Onset: On-going Pain Intervention(s): RN made aware  Home Living/Prior Nora Lives With: Alone;Daughter (daughter staying with patient at discharge) Receives Help From: Family;Friend(s) Type of Home: House Home Layout: One level Home Access: Stairs to enter Entrance Stairs-Rails: Right Entrance Stairs-Number of Steps: 2 Bathroom Shower/Tub: Chiropodist: Standard Bathroom Accessibility: No (via wheelchair) Home Adaptive Equipment: Bedside commode/3-in-1;Wheelchair - manual;Walker - rolling Prior Function Level of Independence:  Independent with basic ADLs;Independent with homemaking with ambulation;Independent with gait;Independent with transfers Driving: Yes  ADL ADL Eating: Not assessed Grooming: Not assessed Upper Body Bathing: Supervision/safety Where Assessed-Upper Body Bathing: Edge of bed Lower Body Bathing: Minimal assistance Where Assessed-Lower Body Bathing: Bed level Upper Body Dressing: Supervision/safety Where Assessed-Upper Body Dressing: Edge of bed Lower Body Dressing: Dependent Where Assessed-Lower Body Dressing: Bed level Toileting: Not assessed Toilet Transfer: Not assessed Tub/Shower Transfer: Not assessed Social research officer, government: Not assessed  Vision/Perception  Vision - History Baseline Vision: Wears glasses all the time Vision - Assessment Eye Alignment: Within Functional Limits Vision Assessment: Vision not tested   Cognition Overall Cognitive Status: Appears within functional limits for tasks assessed Arousal/Alertness: Awake/alert Orientation Level: Oriented X4  Sensation Sensation Additional Comments: Appears intact for UEs  Extremity/Trunk Assessment RUE Assessment RUE Assessment: Within Functional Limits LUE Assessment LUE Assessment: Within Functional Limits  Recommendations for other services: Other: none at this time  Discharge Criteria: Patient will be discharged from OT if patient refuses treatment 3 consecutive times without medical reason, if treatment goals not met, if there is a change in medical status, if patient makes no progress towards goals or if patient is discharged from hospital.  The above assessment, treatment plan, treatment alternatives and goals were discussed and mutually agreed upon: by patient  Session Notes  Session One [phone] - 58 Minutes Individual Therapy Patient complained of 7/10 pain throughout bilateral lower extremities; RN aware Initial 1:1 OT evaluation completed. Skilled intervention focusing on bed mobility,  increasing overall activity tolerance/endurance, LB bathing and dressing at bed level with HOB down, UB bathing and dressing sitting edge of bed. Patient able to verbalize 2/3 back precautions and required min verbal cues to adhere to back precautions. RN notified of bandage coming off from skin breakdown area on right buttock.   Jahmiyah Dullea 02/26/2011, 9:07 AM

## 2011-02-26 NOTE — Discharge Summary (Signed)
Physician Discharge Summary  Patient ID: [name] MRN: [id number] DOB/AGE: Mar 05, 1951 60 y.o.  Admit date: 02/09/2011 Discharge date: 02/25/2011  Consults:[facility] Dr.Blackman  Significant Diagnostic Studies: CT Abdomen 02/08/11=Right and transverse colon wall thickening and pneumatosis  Treatments:02/11/11=Exploratory Laparotomy, Extended Right Hemicolectomy with Ileostomy  Discharge Diagnoses:1.Acute Ischemic Colitis=  requiring exploratory Laparotomy with extended right hemicolectomy and ileostomy 2.Lumbar stenosis with radiculopathy-S/P laminectomy; 3.ESRD-TTS [place]  4. Anemia of Renal Failure-;max aranesp . hgb stable  5. Secondary hyperparathyroidism -  Ca/ phos stable  6. HTN-controlled  7. Nutrition - severe protein/calorie malnutrition with albumin 1.5 02/20/11;Megace with RD to evaluate  8. Deconditioning-rehab to continue.  Hospital Course:  This is a 60 yo bf  With ESRD admitted January 30, 2011 after patient underwent lumbar L4-S1 lumbar laminectomy foraminotomies with decompression as well as lumbar 4 and 5 lumbar L5 and S1 posterior lumbar interbody arthrodesis on November 14 per Dr. Sherwood Gambler She was admitted to Rehab and during her rehab stay  she  had progressive abdominal  pain with CT Abdomen consistent with Acute Ischemic colitis and CCS Preformed surgery as above. She was maintained on empiric Vanco and Zosyn coverage. Continued on her hemodialysis schedule= Tuesdays, Thursdays , and Saturday using no heparin with her diet advanced as tolerated by surgery. She required 2 units prbs for 6.8 hgb November 25. Hgb 9.8 and stable day of discharge to Rehab on max Aranesp.        Her metabolic  Bone disease was treated with Tums as  Her phosphate binder with calcium and phosphorus stable=8.5 and 3.1 respectively. She was not on vitamin D. She will need her PTH level followed up  In her rehab admit.She was not having any issues using her right upper arm avgg on hemodialysis.  Hypertension controlled without meds and there were not any volume issues with dialysis.          She was discharged from 6700 to the rehab floor February 25, 2011 with follow up the [facility]  Surgical group for removal of abdominal staples per their management.      Diet:90-2-2 1200 cc per day Discharge Medications:  Aranesp 264mcg q Thursday  Hemodialysis Nephrovite q day Nepro 269ml TID WC Megestrol 200mg  bid Oxycodone q day prn pain Pantoprazole 40mg  bid ac  And= Discharge Medication List as of 02/25/2011  3:18 PM    CONTINUE these medications which have NOT CHANGED   Details  calcium carbonate (TUMS - DOSED IN MG ELEMENTAL CALCIUM) 500 MG chewable tablet Chew 1-2 tablets by mouth 3 (three) times daily.  , Until Discontinued, Historical Med    hydrOXYzine (ATARAX/VISTARIL) 25 MG tablet Take 25 mg by mouth every 6 (six) hours as needed. For itching, Until Discontinued, Historical Med      STOP taking these medications     naproxen sodium (ANAPROX) 220 MG tablet         Disposition:  Transfer to [id number]   Follow-up Information    Follow up with [facility], MD in 2 weeks. (Call)    Contact information:   [facility], [place] [address] [place] [phone]          Discharge Vital Signs and Labs: Temp:  [98.4 F (36.9 C)-98.5 F (36.9 C)] 98.4 F (36.9 C) (12/11 0612) Pulse Rate:  [107-114] 107  (12/11 0612) Resp:  [18] 18  (12/11 0612) BP: (148-153)/(86-91) 153/86 mmHg (12/11 0612) SpO2:  [96 %] 96 % (12/11  [id number]) Weight:  [56.1 kg (123 lb 10.9 oz)-57.408 kg (126 lb 9 oz)] 123 lb 10.9 oz (56.1 kg) (12/11 0612)  Basename 02/24/11 0742  WBC 13.7*  HGB 9.8*  HCT 31.4*  PLT 611*   Renal:  Basename 02/24/11 0742  NA 138  K 4.5  CL 101  CO2 24  GLUCOSE 75  BUN 21  CREATININE 6.44*  CALCIUM 8.5  PHOS 3.1  ALBUMIN 1.6*   Iron Studies:  Basename 02/24/11 0742  IRON 18*  TIBC 99*    TRANSFERRIN --  FERRITIN --    65 minutes were spent completing this discharge summary, discharge med reconciliation, discharge patient instructions and communicating discharge information to the patient's dialysis center.  Ernest Haber, PA-C [facility] [phone] 02/26/2011, 2:10 PM Kavitha Lansdale C

## 2011-02-26 NOTE — Progress Notes (Signed)
Occupational Therapy Note  Patient Details  Name: [name] MRN: [id number] Date of Birth: [birth year] Today's Date: 02/26/2011  Time: 1030-1100 (68min) 8/10 pain at stoma site and lower back, RN already aware and brought pain meds  1:1 Therapeutic activity to work on transfer training: squat pivot transfers with steady A with verbal cues for feet placement, donning and doffing lumbar corset sitting EOB, sit to stand from elevated bed level, standing tolerance, marching with attention to right LE coordination and strengthening, side stepping along bed side, (all with RW for UE support) to improve ability to complete ADL sit to stand, bed mobility with mod A for both LEs.     Merrilee Seashore 02/26/2011, 11:54 AM

## 2011-02-26 NOTE — Progress Notes (Addendum)
Physical Therapy Assessment and Plan  Patient Details  Name: [name] MRN: [id number] Date of Birth: 07-18-1950  PT Diagnosis: Abnormality of gait, Impaired sensation, Low back pain and Muscle weakness Rehab Potential: Good ELOS: 10days   Today's Date: 02/26/2011 Time: 0930-1030 Time Calculation (min): 60 min  Assessment & Plan Clinical Impression: This is a 60 year old right-handed black female with end-stage renal disease admitted November 14 with increased low back pain since August 2012 with progressive right foot drop. X-rays and imaging showed grade 2 dynamic degenerative spondylolisthesis of lumbar L4 and lumbar L5. MRI revealed severe multi-factorial spinal stenosis and radiculopathy at lumbar L4-5 and marked to severe multi-factorial stenosis at lumbar L5 and S1. Patient underwent lumbar L4-S1 lumbar laminectomy foraminotomies with decompression as well as lumbar 4 and 5 lumbar L5 and S1 posterior lumbar interbody arthrodesis on November 14 per Dr. Sherwood Gambler. She was fitted with a back brace to be worn when out of bed. Patient with noted history of end-stage renal disease with hemodialysis ongoing per renal services. She was admitted inpatient rehabilitation services on November 16 for comprehensive rehabilitation. She was progressing nicely with therapies requiring moderate assistance for overall mobility progressing nicely with endurance and strength. Patient developed some worsening abdominal pain around November 23 increasing tenderness to epigastrium and right lower quadrant. CT scan of the abdomen performed at November 23 demonstrated right and transverse colon wall thickening and pneumatosis. Patient had had 2-3 bowel movements over the previous 24 hours no blood or diarrhea noted. General surgery was consulted and seen by Dr. Nathaneil Canary a Ninfa Linden. Patient was discharged to acute care services underwent exploratory laparotomy with extended right hemicolectomy and ileostomy for  ischemic bowel on November 24 per Dr. Ninfa Linden. Maintained on vancomycin and Zosyn for Empiric coverage. Patient remained on hemodialysis scheduled Tuesdays Thursdays and Saturdays peripheral services. Noted anemia November 25 transfused 2 units of packed red blood cells for hemoglobin of 6.8. Diet was slowly progressed to a regular consistency which he tolerated well. She did have bright red blood from the rectum x2 with followup per surgery felt to be possibly due to heparin during dialysis and monitored hemoglobin did remain stable no further reports of blood noted. All antibiotics since been discontinued and latest white blood cell count of 11,000 hemoglobin 9.3 on 02/21/2011. Therapies are since been resumed she was requiring total assist of 2 to ambulate 50 feet with a rolling walker as well as when her back corset which could be applied in standing position. Patient is to be admitted back to inpatient rehabilitation services for ongoing comprehensive rehabilitation program. Patient transferred to CIR on 02/25/2011   Patient currently requires up to max with mobility secondary to pain, back precautions, generalized weakness, and  neurogenic weakness.  Prior to initial hospitalization, patient was I with mobility and lived alone, but her daughter will be moving in with her in a House.  Home access is 2Stairs to enter.  Patient will benefit from skilled PT intervention to increase strength, maximize safe functional mobility, minimize fall risk and decrease caregiver burden for planned discharge home with 24 hour supervision.  Anticipate patient will benefit from follow up [facility] at discharge.  PT - End of Session Activity Tolerance: Tolerates 30+ min activity with multiple rests Endurance Deficit: Yes Endurance Deficit Description: Pt limited due to weakness and pain PT Assessment Rehab Potential: Good Barriers to Discharge:  (Pt states that a WC would likely fit in home) PT Plan PT Frequency: 1-2  X/day, 60-90 minutes  Estimated Length of Stay: 10days PT Treatment/Interventions: Ambulation/gait training;Balance/vestibular training;Community reintegration;DME/adaptive equipment instruction;Functional electrical stimulation;Functional mobility training;Pain management;Neuromuscular re-education;Patient/family education;Splinting/orthotics;Stair training;Therapeutic Activities;Therapeutic Exercise;UE/LE Strength taining/ROM;UE/LE Coordination activities;Wheelchair propulsion/positioning (bil AFOs? ) PT Recommendation Follow Up Recommendations: Home health PT;24 hour supervision/assistance Equipment Recommended: Wheelchair (measurements);Wheelchair cushion (measurements);3 in 1 bedside comode;Tub/shower bench  Precautions/Restrictions Precautions Precautions: Back Precaution Booklet Issued: No Precaution Comments: Pt. recalled 3/3 back precautions with min cues. Donned back brace with Mod verbal cues Required Braces or Orthoses: Yes Spinal Brace: Lumbar corset Restrictions Weight Bearing Restrictions: No   Pain Pain Assessment Pain Assessment: 0-10 Pain Score:   8 Pain Type: Neuropathic pain Pain Location: Back Pain Orientation: Right;Left Pain Radiating Towards: legs Pain Descriptors: Aching Pain Intervention(s): RN made aware Home Living/Prior Functioning Home Living Lives With: Alone;Daughter (Daughter moving in with her) Receives Help From: Family;Friend(s) Type of Home: House Home Layout: One level Home Access: Stairs to enter Entrance Stairs-Rails: Right Entrance Stairs-Number of Steps: 2 Bathroom Shower/Tub: Chiropodist: Standard Bathroom Accessibility: No (via wheelchair) Home Adaptive Equipment: Bedside commode/3-in-1;Wheelchair - manual;Walker - rolling Prior Function Level of Independence: Independent with basic ADLs;Independent with homemaking with ambulation;Independent with gait;Independent with transfers Driving: Yes Vision/Perception    Vision - History Baseline Vision: Wears glasses all the time Vision - Assessment Eye Alignment: Within Functional Limits Vision Assessment: Vision not tested  Cognition Overall Cognitive Status: Appears within functional limits for tasks assessed Arousal/Alertness: Awake/alert Orientation Level: Oriented X4 Memory: Appears intact Awareness: Appears intact Problem Solving: Appears intact Safety/Judgment: Appears intact Sensation Sensation Light Touch: Impaired Detail (numb bil feet) Light Touch Impaired Details: Absent RLE;Absent LLE Stereognosis: Not tested Hot/Cold: Not tested Proprioception: Impaired by gross assessment Additional Comments: Appears intact for UEs Coordination Gross Motor Movements are Fluid and Coordinated:  (Motor not fluid due to weakness against gravity) Motor  Motor Motor:  (bil foot drop)  Mobility Bed Mobility Bed Mobility: Yes Rolling Right: 4: Min assist;With rail Rolling Right Details: Tactile cues for initiation;Tactile cues for weight shifting;Verbal cues for sequencing;Verbal cues for technique Rolling Right Details (indicate cue type and reason): Cues to bend bil LEs and roll completely before pushign up to sit Left Sidelying to Sit: 3: Mod assist Left Sidelying to Sit Details: Manual facilitation for placement;Verbal cues for precautions/safety;Verbal cues for technique;Verbal cues for sequencing Left Sidelying to Sit Details (indicate cue type and reason): Increased effort and pain with coming up to sit'  Transfers Transfers: Yes Sit to Stand: 2: Max assist;With upper extremity assist;From chair/3-in-1 Sit to Stand Details: Tactile cues for placement;Verbal cues for technique Stand to Sit: 2: Max assist;With upper extremity assist;To chair/3-in-1 Stand to Sit Details (indicate cue type and reason): Verbal cues for sequencing;Verbal cues for precautions/safety;Verbal cues for safe use of DME/AE (Cues for hand placement) Squat Pivot  Transfers: 2: Mod assist Squat Pivot Transfer Details: Tactile cues for weight shifting;Tactile cues for placement;Visual cues/gestures for precautions/safety;Verbal cues for sequencing;Verbal cues for technique;Verbal cues for precautions/safety Locomotion  Ambulation Ambulation: Yes Ambulation/Gait Assistance: 2: Max assist Ambulation Distance (Feet): 8 Feet Assistive device: Rolling walker Ambulation/Gait Assistance Details: Tactile cues for weight shifting;Tactile cues for placement;Verbal cues for sequencing;Verbal cues for technique;Verbal cues for precautions/safety;Verbal cues for gait pattern;Verbal cues for safe use of DME/AE;Manual facilitation for weight shifting Ambulation/Gait Assistance Details (indicate cue type and reason): A for LE lifting and placing due to bil LE foot drop, A for seaight shifting to unweight opposite LE, blocking knees Gait Gait: Yes Gait Pattern: Impaired Gait Pattern: Decreased  dorsiflexion - right;Decreased dorsiflexion - left;Right foot flat;Left foot flat;Right genu recurvatum;Left genu recurvatum;Trunk flexed;Right hip hike Stairs / Additional Locomotion Stairs: No (Unsafe t this time) Product manager Mobility: Yes Wheelchair Assistance: 4: Advertising account executive Details: Visual cues for safe use of DME/AE;Verbal cues for technique;Verbal cues for safe use of DME/AE (Cues for increasing stroke length and turning) Environmental health practitioner: Both upper extremities Wheelchair Parts Management: Supervision/cueing (Cues for maintaining back precautions with parts management) Distance: 150  Trunk/Postural Assessment  Lumbar Assessment Lumbar Assessment: Exceptions to [facility] (Limited due to precautions)  Balance Balance Balance Assessed: No Extremity Assessment  RUE Assessment RUE Assessment: Within Functional Limits LUE Assessment LUE Assessment: Within Functional Limits RLE Assessment RLE Assessment: Exceptions to [facility]  (Hips/knees grossly 2+/5, 0/5 DF, 3/5 PF) RLE PROM (degrees) Overall PROM Right Lower Extremity: Within functional limits for tasks assessed LLE Assessment LLE Assessment: Exceptions to [facility] (Hips/knees grossly 2+/5, 0/5 DF, 3/5 PF) LLE PROM (degrees) Overall PROM Left Lower Extremity: Within functional limits for tasks assessed  Recommendations for other services: None  Treatment Initiated: Pt instructed in seated there-ex for LE strengthening with cues for technique: bil marching and LAQ x10 and NuStep x30min level 2 with LEs only to protect back, targeted Borg of 13 and at least 20spm avg goal, but pt unable to maintain 20spm.   Discharge Criteria: Patient will be discharged from PT if patient refuses treatment 3 consecutive times without medical reason, if treatment goals not met, if there is a change in medical status, if patient makes no progress towards goals or if patient is discharged from hospital.  The above assessment, treatment plan, treatment alternatives and goals were discussed and mutually agreed upon: by patient  Windy Fast, PT 02/26/2011, 10:36 AM

## 2011-02-26 NOTE — Progress Notes (Signed)
Occupationall Therapy Note  Patient Details  Name: [name] MRN: [id number] Date of Birth: 1950/07/11 Today's Date: 02/26/2011   Time In:  1530  Time Out:  1608  Individual Therapy.  Patient states numbness and tingling in feet and legs and some discomfort in lower back.  RN notified and patient had received pain med at1400  Repositioned patient in wheelchair and patient stated she was more comfortable.  Treatment focused on bed mobility, back precautions, sitting balance, scooting, donning brace, bed to wheelchair transfers, and upper extremity exercises.  Patient able to complete 12 reps x 2 of three UE exercises with one pound weight. Weights left in patient's room for patient to do on her own. 02/26/2011, 4:10 PM

## 2011-02-26 NOTE — Patient Care Conference (Signed)
Inpatient RehabilitationTeam Conference Note Date: 02/26/2011   Time: 1:45 PM    Patient Name: [name]      Medical Record Number: [id number]  Date of Birth: 01-06-1951 Sex: Female         Room/Bed: 4006/4006-01 Payor Info: Payor: MEDICARE  Plan: MEDICARE PART A AND B  Product Type: *No Product type*     Admitting Diagnosis: LUMBAR FUSION,HEMICOLECTOMY, ESRD  Admit Date/Time:  02/25/2011  3:33 PM Admission Comments: No comment available   Primary Diagnosis:  Radiculopathy of lumbar region Principal Problem: Radiculopathy of lumbar region  Patient Active Problem List  Diagnoses Date Noted  . Radiculopathy of lumbar region 02/26/2011  . Anemia of chronic kidney failure 02/09/2011  . Acute ischemic colitis 02/09/2011  . Radiculopathy 02/01/2011  . Lumbar stenosis with neurogenic claudication 01/30/2011  . Spondylolisthesis of lumbar region 01/30/2011  . End stage renal disease on dialysis 01/30/2011  . Hypertension 01/30/2011  . Unspecified viral hepatitis C without hepatic coma 01/30/2011    Expected Discharge Date: Expected Discharge Date: 03/08/11  Team Members Present: Physician: Dr. Alger Simons Case Manager Present: Lutricia Feil, RN Social Worker Present: Ovidio Kin, LCSW OT Present: Chrys Racer, OT RN Present: Janyth Pupa    Current Status/Progress Goal Weekly Team Focus  Medical   Readmitted after ischemic colitis and bowel resection. Patient with continued bilateral foot drop  Adequate nutrition and pain control  Mobility. May need AFOs for gait   Bowel/Bladder   Anuria and Ileostomy  Routine ostomy care  Begin to education pt regarding care of ileostomy after discharge   Swallow/Nutrition/ Hydration             ADL's   Supervision for UB ADLs, Total assist for LB ADLs  Overall supervision  ADL retraining, transfers, increasing activity tolerance   Mobility   Min - mod assist with bed mobility and transfers: ambulating 8 feet with min assist   supervision with bed mobility and transfers; min assist with ambulation and stairs  transfers and gait   Communication             Safety/Cognition/ Behavioral Observations            Pain   Scheduled Percocet  at 0700 and 1200, as  well as request of Percocet 2 tabs q 4hrs.  Pain managed with scheduled pain medication  Assess effectiveness of pain medication and frequency of request of prn pain med   Skin   R IJ with dressing CDI, Graft in RUE, abdominal  incision with steristrips, lumbar incision OTA, unremarkable, ileostomy to RUQ, stage 2 to skin abrasion on sacrum with allevyn dressing intact  Not additional skin breakdown. Routine turn q 2hrs, assess for appropriate healing.  Educate pt on skin care and incision management      *See Interdisciplinary Assessment and Plan and progress notes for long and short-term goals  Barriers to Discharge:      Possible Resolutions to Barriers:  Orthotics endurance training    Discharge Planning/Teaching Needs:  Home with daughter, Mateo Flow, and friend, Hassan Rowan to provide 24/7 supervision - minimal assistance      Team Discussion: Probably will need AFOs d/t foot drop.  Overall goals are supervision w/c level, min assist up & down steps.  New ostomy-plan HH RN   Revisions to Treatment Plan: none    Continued Need for Acute Rehabilitation Level of Care: The patient requires daily medical management by a physician with specialized training in physical medicine and rehabilitation  for the following conditions: Daily analysis of laboratory values and/or radiology reports with any subsequent need for medication adjustment of medical intervention for : Post surgical problems;Neurological problems;Other  Theora Master 02/27/2011, 3:53 PM

## 2011-02-27 ENCOUNTER — Inpatient Hospital Stay (HOSPITAL_COMMUNITY): Payer: Medicare Other

## 2011-02-27 LAB — RENAL FUNCTION PANEL
BUN: 27 mg/dL — ABNORMAL HIGH (ref 6–23)
CO2: 24 mEq/L (ref 19–32)
Chloride: 99 mEq/L (ref 96–112)
GFR calc Af Amer: 6 mL/min — ABNORMAL LOW (ref >90)
Glucose, Bld: 90 mg/dL (ref 70–99)
Phosphorus: 3.6 mg/dL (ref 2.3–4.6)
Potassium: 4.4 mEq/L (ref 3.5–5.1)
Sodium: 138 mEq/L (ref 135–145)

## 2011-02-27 LAB — CBC
HCT: 30.4 % — ABNORMAL LOW (ref 36.0–46.0)
Hemoglobin: 9.8 g/dL — ABNORMAL LOW (ref 12.0–15.0)
RBC: 3.43 MIL/uL — ABNORMAL LOW (ref 3.87–5.11)
WBC: 15.8 10*3/uL — ABNORMAL HIGH (ref 4.0–10.5)

## 2011-02-27 MED ORDER — OXYCODONE-ACETAMINOPHEN 5-325 MG PO TABS
ORAL_TABLET | ORAL | Status: AC
  Administered 2011-02-27: 2 via ORAL
  Filled 2011-02-27: qty 2

## 2011-02-27 MED ORDER — LIDOCAINE-PRILOCAINE 2.5-2.5 % EX CREA: 1.0000 "application " | TOPICAL_CREAM | CUTANEOUS | Status: DC | PRN

## 2011-02-27 MED ORDER — LIDOCAINE HCL (PF) 1 % IJ SOLN: 5.0000 mL | INTRAMUSCULAR | Status: DC | PRN

## 2011-02-27 MED ORDER — HEPARIN SODIUM (PORCINE) 1000 UNIT/ML DIALYSIS: 1000.0000 [IU] | INTRAMUSCULAR | Status: DC | PRN

## 2011-02-27 MED ORDER — PENTAFLUOROPROP-TETRAFLUOROETH EX AERO: 1.0000 "application " | INHALATION_SPRAY | CUTANEOUS | Status: DC | PRN

## 2011-02-27 MED ORDER — SODIUM CHLORIDE 0.9 % IV SOLN: 100.0000 mL | INTRAVENOUS | Status: DC | PRN

## 2011-02-27 MED ORDER — MEGESTROL ACETATE 400 MG/10ML PO SUSP
200.0000 mg | Freq: Two times a day (BID) | ORAL | Status: DC
  Administered 2011-02-27 – 2011-02-28 (×3): 200 mg via ORAL
  Administered 2011-03-01: 08:00:00 via ORAL
  Administered 2011-03-01 – 2011-03-16 (×26): 200 mg via ORAL
  Administered 2011-03-17: 400 mg via ORAL
  Administered 2011-03-17 – 2011-03-21 (×8): 200 mg via ORAL
  Filled 2011-02-27 (×51): qty 5

## 2011-02-27 MED ORDER — NEPRO/CARBSTEADY PO LIQD: 237.0000 mL | ORAL | Status: DC | PRN

## 2011-02-27 MED ORDER — GABAPENTIN 100 MG PO CAPS
100.0000 mg | ORAL_CAPSULE | Freq: Every day | ORAL | Status: DC
  Administered 2011-02-27 – 2011-03-02 (×4): 100 mg via ORAL
  Filled 2011-02-27 (×7): qty 1

## 2011-02-27 MED ORDER — DARBEPOETIN ALFA-POLYSORBATE 200 MCG/0.4ML IJ SOLN
INTRAMUSCULAR | Status: AC
  Administered 2011-02-27: 200 ug via INTRAVENOUS
  Filled 2011-02-27: qty 0.4

## 2011-02-27 MED ORDER — DIPHENHYDRAMINE HCL 25 MG PO CAPS: 25.0000 mg | ORAL_CAPSULE | Freq: Four times a day (QID) | ORAL | Status: DC | PRN

## 2011-02-27 MED ORDER — HEPARIN SODIUM (PORCINE) 1000 UNIT/ML DIALYSIS
20.0000 [IU]/kg | INTRAMUSCULAR | Status: DC | PRN
  Administered 2011-02-27: 1100 [IU] via INTRAVENOUS_CENTRAL

## 2011-02-27 NOTE — Progress Notes (Addendum)
Physical Therapy Note  Patient Details  Name: [name] MRN: [id number] Date of Birth: 1950-10-18 Today's Date: 02/27/2011  Pt refused 73min of 2nd session with PT this afternoon due to not feeling well from being up last night in HD.   [facility] [place], [place] 02/27/2011, 2:54 PM

## 2011-02-27 NOTE — Progress Notes (Signed)
Patient ID: [name], female   DOB: 1950-06-29, 60 y.o.   MRN: [id number] Patient ID: [name], female   DOB: [birth year], 60 y.o.   MRN: [id number] Subjective/Complaints: Review of Systems  Musculoskeletal: Positive for back pain.  All other systems reviewed and are negative.  did well last night. No real c/o. Wants to shower   Objective: Vital Signs: Blood pressure 126/75, pulse 122, temperature 97.5 F (36.4 C), temperature source Oral, resp. rate 20, height 5\' 5"  (1.651 m), weight 54.2 kg (119 lb 7.8 oz), SpO2 96.00%. No results found.  Basename 02/27/11 0331  WBC 15.8*  HGB 9.8*  HCT 30.4*  PLT 553*    Basename 02/27/11 0331  NA 138  K 4.4  CL 99  CO2 24  GLUCOSE 90  BUN 27*  CREATININE 7.37*  CALCIUM 8.5   CBG (last 3)  No results found for this basename: GLUCAP:3 in the last 72 hours  Wt Readings from Last 3 Encounters:  02/27/11 54.2 kg (119 lb 7.8 oz)  02/24/11 56.4 kg (124 lb 5.4 oz)  02/24/11 56.4 kg (124 lb 5.4 oz)    Physical Exam:  General appearance: alert, cooperative and no distress Head: Normocephalic, without obvious abnormality, atraumatic Eyes: conjunctivae/corneas clear. PERRL, EOM's intact. Fundi benign. Ears: normal TM's and external ear canals both ears Nose: Nares normal. Septum midline. Mucosa normal. No drainage or sinus tenderness. Throat: lips, mucosa, and tongue normal; teeth and gums normal Neck: no adenopathy, no carotid bruit, no JVD, supple, symmetrical, trachea midline and thyroid not enlarged, symmetric, no tenderness/mass/nodules Back: symmetric, no curvature. ROM normal. No CVA tenderness. Resp: clear to auscultation bilaterally Cardio: regular rate and rhythm, S1, S2 normal, no murmur, click, rub or gallop GI: soft, non-tender; bowel sounds normal; no masses,  no organomegaly Extremities: extremities normal, atraumatic, no cyanosis or edema slight edema distally only Pulses: 2+ and symmetric Skin: Skin color,  texture, turgor normal. No rashes or lesions Neurologic: continued foot drop tr-1/5 but pt denies sensory changes.  Heel cords loose Incision/Wound: wounds all clean and intact.  Ostomy site sealed and stool of normal consistency   Assessment/Plan: 1. Functional deficits secondary to lumbar stenosis with radiculopathy s/p decompression and ischemic colitis requiring colectomy which require 3+ hours per day of interdisciplinary therapy in a comprehensive inpatient rehab setting. Physiatrist is providing close team supervision and 24 hour management of active medical problems listed below. Physiatrist and rehab team continue to assess barriers to discharge/monitor patient progress toward functional and medical goals. Mobility: Bed Mobility Bed Mobility: Yes Rolling Right: 4: Min assist;With rail Rolling Right Details (indicate cue type and reason): Cues to bend bil LEs and roll completely before pushign up to sit Left Sidelying to Sit: 3: Mod assist Left Sidelying to Sit Details (indicate cue type and reason): Increased effort and pain with coming up to sit'  Transfers Transfers: Yes Sit to Stand: 2: Max assist;With upper extremity assist;From chair/3-in-1 Stand to Sit: 2: Max assist;With upper extremity assist;To chair/3-in-1 Squat Pivot Transfers: 3: Mod assist Ambulation/Gait Ambulation/Gait Assistance: 2: Max assist Ambulation/Gait Assistance Details (indicate cue type and reason): A for LE lifting and placing due to bil LE foot drop, A for seaight shifting to unweight opposite LE, blocking knees Ambulation Distance (Feet): 8 Feet Assistive device: Rolling walker Gait Pattern: Decreased dorsiflexion - right;Decreased dorsiflexion - left;Right foot flat;Left foot flat;Right genu recurvatum;Left genu recurvatum;Trunk flexed;Right hip hike Stairs: No (Unsafe t this time) Product manager Mobility: Yes Wheelchair Assistance:  4: Min Lexicographer: Both upper  extremities Wheelchair Parts Management: Supervision/cueing (Cues for maintaining back precautions with parts management) Distance: 150 ADL:   Cognition: Cognition Overall Cognitive Status: Appears within functional limits for tasks assessed Arousal/Alertness: Awake/alert Orientation Level: Oriented X4 Memory: Appears intact Awareness: Appears intact Problem Solving: Appears intact Safety/Judgment: Appears intact Cognition Arousal/Alertness: Awake/alert Orientation Level: Oriented X4  1.Lumbar stenosis with radiculopathy-S/P laminectomy Lumbar 4-5 and L 5 S1 11/14.Back corset when out of bed.Routine back precautions. Consider b/l AFO's. Has AFO's in bed.  Will almost certainly need AFO's for gait.  2. DVT Prophylaxis/Anticoagulation: SCD,s/ and support hose.Monitor for any signs of DVT.   3. Pain Management: percocet prn.Monitor with increased activity. Will schedule a percocet with am and afternoon therapies. Doing very well with pain thi am.  4.Ischemic Colitis-S/P exp lap with right hemicolectomy and ileostomy 11/24.All antibiotics discontinued.Monitor wound site.Provide ileostomy care and education. She is doing some ostomy care already on her own. WBC's up today.  Afebrile.  Clinically improved. Recheck WBC's again tomorrow.  5.ESRD-dialysis as directed per renal services.Weigh patient daily.Follow up labs with dialysis.   6Chronic anemia-aranesp with HD.Follow up CBC with HD and transfuse if symptomatic.   7.HTN-No present meds and monitor with increased activity.   3. Mood: ego support. She seems to be in great spirits and quite motivated. Will follow closely.      LOS (Days) 2 A FACE TO FACE VISIT WAS PERFORMED WITH THIS PATIENT  SWARTZ,ZACHARY T 02/27/2011, 8:12 AM

## 2011-02-27 NOTE — Progress Notes (Signed)
Patient information reviewed and entered into UDS-PRO system by Cicero Noy, RN, CRRN, PPS Coordinator.  Information including medical coding and functional independence measure will be reviewed and updated through discharge.     Per nursing patient was given "Data Collection Information Summary for Patients in Inpatient Rehabilitation Facilities with attached "Privacy Act Statement-Health Care Records" upon admission.   

## 2011-02-27 NOTE — Progress Notes (Signed)
OccupationalTherapy Note  Patient Details  Name: [name] MRN: [id number] Date of Birth: [birth year] Today's Date: 02/27/2011 Time: 1418 -   Pain: 8. Lower back and toes. Just had pain meds.  Skilled Intervention: Discussed POC. Daughter plans to move in with pt after D/C. Completed chair to w/c transfer with Min A. Pt states she didn't return from HD until early this am and was exhausted. Attempted ADL retraining but pt refused. Worked on strengthening and endurance activity.   Individual session.  Nyjah Schwake,HILLARY 02/27/2011, 2:22 PM

## 2011-02-27 NOTE — Progress Notes (Signed)
Subjective:  I am tired,had hd late last night into the am Objective Vital signs in last 24 hours: Filed Vitals:   02/27/11 0600 02/27/11 0624 02/27/11 0630 02/27/11 0658  BP: 144/87 125/84 127/78 126/75  Pulse: 125 136 123 122  Temp:    97.5 F (36.4 C)  TempSrc:    Oral  Resp: 18   20  Height:      Weight:    54.2 kg (119 lb 7.8 oz)  SpO2:    96%   Weight change: -1.108 kg (-2 lb 7.1 oz)  Intake/Output Summary (Last 24 hours) at 02/27/11 1132 Last data filed at 02/27/11 0658  Gross per 24 hour  Intake    240 ml  Output   2650 ml  Net  -2410 ml   Labs: Basic Metabolic Panel:  Lab 0000000 0331 02/24/11 0742 02/21/11 1330  NA 138 138 135  K 4.4 4.5 3.9  CL 99 101 98  CO2 24 24 24   GLUCOSE 90 75 92  BUN 27* 21 21  CREATININE 7.37* 6.44* 5.44*  CALCIUM 8.5 8.5 7.0*  ALB -- -- --  PHOS 3.6 3.1 3.8   Liver Function Tests:  Lab 02/27/11 0331 02/24/11 0742 02/21/11 1330  AST -- -- --  ALT -- -- --  ALKPHOS -- -- --  BILITOT -- -- --  PROT -- -- --  ALBUMIN 1.7* 1.6* 1.5*   No results found for this basename: LIPASE:3,AMYLASE:3 in the last 168 hours No results found for this basename: AMMONIA:3 in the last 168 hours CBC:  Lab 02/27/11 0331 02/24/11 0742 02/21/11 1330  WBC 15.8* 13.7* 11.2*  NEUTROABS -- -- --  HGB 9.8* 9.8* 9.3*  HCT 30.4* 31.4* 28.1*  MCV 88.6 92.1 87.5  PLT 553* 611* 467*   Cardiac Enzymes: No results found for this basename: CKTOTAL:5,CKMB:5,CKMBINDEX:5,TROPONINI:5 in the last 168 hours CBG:  Lab 02/22/11 1644 02/22/11 1124 02/22/11 1019 02/22/11 0759  GLUCAP 111* 128* 103* 67*    Iron Studies: No results found for this basename: IRON,TIBC,TRANSFERRIN,FERRITIN in the last 72 hours Studies/Results: No results found. Medications:      . calcium carbonate  400 mg of elemental calcium Oral TID WC  . darbepoetin (ARANESP) injection - DIALYSIS  200 mcg Intravenous Q Tue-HD  . feeding supplement (NEPRO CARB STEADY)  237 mL Oral TID  WC  . ferric glucontate (NULECIT) IV  125 mg Intravenous Q T,Th,Sa-HD  . gabapentin  100 mg Oral QHS  . multivitamin  1 tablet Oral Daily  . oxyCODONE-acetaminophen  1 tablet Oral BID  . pantoprazole  40 mg Oral Q1200   I  have reviewed scheduled and prn medications.  Physical Exam: General:alert nad  Heart:RRR, 2/6 sem LSB  Lungs:CTA  Abdomen:bs pos. Colostomy bag in place with stool . Surgical site healing with steri strips in place. Extremities: Dialysis Access: pos. Bruit rt upper arm, left doral ft.trace edema , RT 1+ edema dorsal     Problem/Plan:  1.Lumbar stenosis with radiculopathy-S/P laminectomy; Now  Back on rehab.  2.Ischemic Colitis-S/P right hemicolectomy and ileostomy 11/24. Now off all antibiotics. Daily PPI; tolerating meals  3.ESRD-TTS [place]; use albumin  With uf 1.5 liters tomor. On hd  4. Anemia of Renal Failure-Hgb 9.8 Max Aranesp; appears stable; T-Sat 20% 11/26 with high Ferritin (2404); no IV iron 5. Secondary hyperparathyroidism - phos good; Tums with meals; no vitamin D; follow closely secondary poor appetite  6. HTN-controlled; however, some pedal edema,UF as Tol With  HD  And albumin for bp support 7. Nutrition - severe protein/calorie malnutrition with albumin 1.5 02/20/11; appetite poor, has Nepro; add Megace .    Ernest Haber, PA-C [facility] [place] [phone] 02/27/2011,11:32 AM  LOS: 2 days

## 2011-02-27 NOTE — Progress Notes (Signed)
Patient information reviewed and entered into UDS-PRO system by Myrtle Barnhard, RN, CRRN, PPS Coordinator.  Information including medical coding and functional independence measure will be reviewed and updated through discharge.     Per nursing patient was given "Data Collection Information Summary for Patients in Inpatient Rehabilitation Facilities with attached "Privacy Act Statement-Health Care Records" upon admission.   

## 2011-02-27 NOTE — Progress Notes (Signed)
Occupational Therapy Note  Patient Details  Name: [name] MRN: [id number] Date of Birth: [birth year] Today's Date: 02/27/2011  Patient missed 60 minutes of skilled OT secondary to refusal secondary to pain and exhaustion. Patient didn't return from HD until 8am. RN aware of pain and refusal. Recommending 15hours/7days of therapy for successful therapies with patient.    [name] 02/27/2011, 11:22 AM

## 2011-02-27 NOTE — Consult Note (Signed)
Ostomy nurse follow-up:  Changed ostomy pouch with patient assistance.  She is able to empty and use velcro closure.  1 cm of mucocutaneous separation at 3 oclock of stoma.  Stoma red and viable, above skin level, 100cc brown liquid stool in pouch.  Peristomal skin intact.  Recommend home health assistance after discharge.  Reviewed pouching routines and ordering supplies. One piece flexible pouch applied.   Julien Girt, RN, MSN, [employer]  [phone]

## 2011-02-27 NOTE — Progress Notes (Signed)
Recreational Therapy Assessment and Plan  Patient Details  Name: [name] MRN: [id number] Date of Birth: Jul 09, 1950  Rehab Potential:  Good ELOS: 10 days   Assessment Clinical Impression: This is a 60 year old right-handed black female with end-stage renal disease admitted November 14 with increased low back pain since August 2012 with progressive right foot drop. X-rays and imaging showed grade 2 dynamic degenerative spondylolisthesis of lumbar L4 and lumbar L5. MRI revealed severe multi-factorial spinal stenosis and radiculopathy at lumbar L4-5 and marked to severe multi-factorial stenosis at lumbar L5 and S1. Patient underwent lumbar L4-S1 lumbar laminectomy foraminotomies with decompression as well as lumbar 4 and 5 lumbar L5 and S1 posterior lumbar interbody arthrodesis on November 14 per Dr. Sherwood Gambler. She was fitted with a back brace to be worn when out of bed. Patient with noted history of end-stage renal disease with hemodialysis ongoing per renal services. She was admitted inpatient rehabilitation services on November 16 for comprehensive rehabilitation. She was progressing nicely with therapies requiring moderate assistance for overall mobility progressing nicely with endurance and strength. Patient developed some worsening abdominal pain around November 23 increasing tenderness to epigastrium and right lower quadrant. CT scan of the abdomen performed at November 23 demonstrated right and transverse colon wall thickening and pneumatosis. Patient had had 2-3 bowel movements over the previous 24 hours no blood or diarrhea noted. General surgery was consulted and seen by Dr. Nathaneil Canary a Ninfa Linden. Patient was discharged to acute care services underwent exploratory laparotomy with extended right hemicolectomy and ileostomy for ischemic bowel on November 24 per Dr. Ninfa Linden. Maintained on vancomycin and Zosyn for Empiric coverage. Patient remained on hemodialysis scheduled Tuesdays Thursdays and  Saturdays peripheral services. Noted anemia November 25 transfused 2 units of packed red blood cells for hemoglobin of 6.8. Diet was slowly progressed to a regular consistency which he tolerated well. She did have bright red blood from the rectum x2 with followup per surgery felt to be possibly due to heparin during dialysis and monitored hemoglobin did remain stable no further reports of blood noted. All antibiotics since been discontinued and latest white blood cell count of 11,000 hemoglobin 9.3 on 02/21/2011. Patient transferred to CIR on 02/25/2011.   Patient's past medical history is significant for:  *Seizures  r/t HTN in 1990's x 1  . Brain aneurysm  . Blood transfusion 1990's  r/t Kidney removal surgery  . Arthritis  Back  . Anemia  . Umbilical hernia age 54  . Heart murmur  Born with heart mumur, does not require follow up per pt  . Hypertension  Does not see a heart doctor, had pre transplant stress test at [facility]  . Diabetes mellitus  Borderline  . End stage renal disease on dialysis  T/Th/Sat dialysis on [employer]  . End stage renal disease on dialysis  01-29-2011  . Hepatitis C   Pt presents with decreased activity tolerance, decreased functional mobility, decreased balance, decreased safety limited pt's independence with leisure/community pursuits.   Recreational Therapy Leisure History/Participation Premorbid leisure interest/current participation: Community - Doctor, hospital - Grocery store;Community - Travel (Comment);Sports - Exercise (Comment);Sports - Basketball;Nature - [employer] care Other Leisure Interests: Television;Cooking/Baking Leisure Participation Style: With Family/Friends;Alone Community Reintegration Does patient have pets?: Yes (mixed breed) Social interaction - Mood/Behavior: Cooperative Recreational Therapy Orientation Orientation -Reviewed with patient: Available activity resources;Use of Dayroom Strengths/Weaknesses Patient  Strengths/Abilities: Willingness to participate;Active premorbidly Patient weaknesses: Physical limitations  Plan Rec Therapy Plan Is patient appropriate for Therapeutic Recreation?: Yes Treatment times  per week: Min 1 time per week Estimated Length of Stay: 10 days Therapy Goals Achieved By:: Recreation/leisure participation;Community reintegration/education;1:1 session;Group participation (Comment);Adaptive equipment instruction;Patient/family education  Recommendations for other services: None  Discharge Criteria: Patient will be discharged from TR if patient refuses treatment 3 consecutive times without medical reason.  If treatment goals not met, if there is a change in medical status, if patient makes no progress towards goals or if patient is discharged from hospital.  The above assessment, treatment plan, treatment alternatives and goals were discussed and mutually agreed upon: by patient  [place] 02/27/2011, 9:47 AM

## 2011-02-27 NOTE — Progress Notes (Addendum)
Physical Therapy Note  Patient Details  Name: [name] MRN: [id number] Date of Birth: 05/15/1950 Today's Date: 02/27/2011 Time in/out: 12:45-1:45  Pain: 8/10 L dorsum foot Individual tx  Pt very fatigued today due to returning from HD at 8am, but agreeable to bedside tx with encouragement. Tx focused on LE strengthening in supine and sitting as well as fitting pt for  AFOs and walking.  Supine there ex: PROM ankle DF 4x30sec hold, QS with 3 sec hold, GS with 3 sec hold, SAQ (decreased L LE ROM 2* weakness), heel slides, hip ABD with AAROM, all bil x10. All with cues for technique and to not use UE for A for increasing LE muscle recruitment.  Bed mobility: scooting to [facility] w Min A for hips, cues for hooklying and to use UE to push into bed rather than rails.  Rolling L/R with S, 2 handrails, and cues for technique. Side>sit with Min A and cues for sequencing and hand placement.  Unsupported sitting EOB while donning back brace, socks, AFOs, and shoes x36min. Pt unable to don R sock due to LE pain. Pt will ask daughter/husband to bring in size 10 shoe for better fit.  Sit>stand with Min A to RW, cues for hand placement. Gait 1x5' with RW and Max A for LE blocking and weight shifting, cues for posture. Distance limited due to pain and fatigue. Stand>sit with Min A for control descent. Squat pivot transfer with Min A for LE placement and steadying.  Pt educated on importance of participating in all therapies as much as possible, even at lower levels on HD days.  Pt able to recall 2/3 back precautions, forgetting arching.  Pt left in chair with call bell, back brace.   [facility] [place], [place] 02/27/2011, 1:50 PM

## 2011-02-28 ENCOUNTER — Inpatient Hospital Stay (HOSPITAL_COMMUNITY): Payer: Medicare Other

## 2011-02-28 LAB — RENAL FUNCTION PANEL
CO2: 26 mEq/L (ref 19–32)
Calcium: 9.1 mg/dL (ref 8.4–10.5)
Creatinine, Ser: 5.76 mg/dL — ABNORMAL HIGH (ref 0.50–1.10)
Glucose, Bld: 111 mg/dL — ABNORMAL HIGH (ref 70–99)

## 2011-02-28 LAB — CBC
HCT: 32.6 % — ABNORMAL LOW (ref 36.0–46.0)
Hemoglobin: 10.3 g/dL — ABNORMAL LOW (ref 12.0–15.0)
Hemoglobin: 9.4 g/dL — ABNORMAL LOW (ref 12.0–15.0)
MCH: 28.6 pg (ref 26.0–34.0)
MCV: 89.1 fL (ref 78.0–100.0)
RBC: 3.64 MIL/uL — ABNORMAL LOW (ref 3.87–5.11)
RBC: 3.29 MIL/uL — ABNORMAL LOW (ref 3.87–5.11)
RDW: 16.6 % — ABNORMAL HIGH (ref 11.5–15.5)
WBC: 15.4 10*3/uL — ABNORMAL HIGH (ref 4.0–10.5)

## 2011-02-28 LAB — DIFFERENTIAL
Band Neutrophils: 0 % (ref 0–10)
Blasts: 0 %
Eosinophils Relative: 4 % (ref 0–5)
Lymphocytes Relative: 25 % (ref 12–46)
Monocytes Absolute: 2.3 10*3/uL — ABNORMAL HIGH (ref 0.1–1.0)
Monocytes Relative: 15 % — ABNORMAL HIGH (ref 3–12)
Neutro Abs: 8.6 10*3/uL — ABNORMAL HIGH (ref 1.7–7.7)
nRBC: 0 /100 WBC

## 2011-02-28 MED ORDER — HEPARIN SODIUM (PORCINE) 1000 UNIT/ML DIALYSIS
1000.0000 [IU] | INTRAMUSCULAR | Status: DC | PRN
  Filled 2011-02-28: qty 1

## 2011-02-28 MED ORDER — SODIUM CHLORIDE 0.9 % IV SOLN: 100.0000 mL | INTRAVENOUS | Status: DC | PRN

## 2011-02-28 MED ORDER — NEPRO/CARBSTEADY PO LIQD: 237.0000 mL | ORAL | Status: DC | PRN

## 2011-02-28 MED ORDER — LIDOCAINE-PRILOCAINE 2.5-2.5 % EX CREA: 1.0000 "application " | TOPICAL_CREAM | CUTANEOUS | Status: DC | PRN

## 2011-02-28 MED ORDER — LIDOCAINE HCL (PF) 1 % IJ SOLN
5.0000 mL | INTRAMUSCULAR | Status: DC | PRN
  Filled 2011-02-28: qty 5

## 2011-02-28 MED ORDER — HEPARIN SODIUM (PORCINE) 1000 UNIT/ML DIALYSIS
20.0000 [IU]/kg | INTRAMUSCULAR | Status: DC | PRN
  Filled 2011-02-28: qty 2

## 2011-02-28 MED ORDER — PENTAFLUOROPROP-TETRAFLUOROETH EX AERO: 1.0000 "application " | INHALATION_SPRAY | CUTANEOUS | Status: DC | PRN

## 2011-02-28 MED ORDER — ALTEPLASE 2 MG IJ SOLR
2.0000 mg | Freq: Once | INTRAMUSCULAR | Status: AC | PRN
  Filled 2011-02-28: qty 2

## 2011-02-28 NOTE — Progress Notes (Signed)
Min A transferred from w/c to bed. Complainant with use of corsett when out of bed.  Educating regarding ileostomy care. Pt comfortable looking at ostomy pouch, and holding container for emptying of contents. Reported new onset of spasmatic pain to L leg, that pt states self resolved.  Abdominal incision with steristrips, healing appropriately. Lumbar incision with dermabond, open to air, unremarkable. RUE graft, +, +.  Decreased request for prn pain medication this shift,  participating in therapy. Resting in wheelchair between sessions.

## 2011-02-28 NOTE — Progress Notes (Signed)
Social Work Assessment and Plan Assessment and Plan  Patient Name: [name]  [id number] Date: 02/28/2011  Problem List:  Patient Active Problem List  Diagnoses  . Lumbar stenosis with neurogenic claudication  . Spondylolisthesis of lumbar region  . End stage renal disease on dialysis  . Hypertension  . Unspecified viral hepatitis C without hepatic coma  . Radiculopathy  . Anemia of chronic kidney failure  . Acute ischemic colitis  . Radiculopathy of lumbar region    Past Medical History:  Past Medical History  Diagnosis Date  . Seizures     r/t HTN in 1990's x 1  . Brain aneurysm   . Blood transfusion 1990's    r/t Kidney removal surgery  . Arthritis     Back  . Anemia   . Umbilical hernia age 60  . Heart murmur     Born with heart mumur, does not require follow up per pt  . Hypertension     Does not see a heart doctor, had pre transplant stress test at [facility]    . Diabetes mellitus     Borderline  . End stage renal disease on dialysis     T/Th/Sat dialysis on [employer]  . End stage renal disease on dialysis     01-29-2011  . Hepatitis C     Past Surgical History:  Past Surgical History  Procedure Date  . Nephrectomy 2010    right side done at [facility] on transplant list  . Umbilical hernia repair age 60- 4  . Appendectomy 1960's  . Vascular surgery     right arm dialysis graft  . Tonsillectomy     as a child  . Rib resection     d/t kidney removal  . Back surgery 01/30/2011    laminectomy  . Hernia repair   . Laparotomy 02/09/2011    Procedure: EXPLORATORY LAPAROTOMY;  Surgeon: Harl Bowie, MD;  Location: [place];  Service: General;  Laterality: N/A;  . Partial colectomy 02/09/2011    Procedure: PARTIAL COLECTOMY;  Surgeon: Harl Bowie, MD;  Location: [place];  Service: General;  Laterality: Right;  . Colostomy 02/09/2011    Procedure: COLOSTOMY;  Surgeon: Harl Bowie, MD;  Location: [place];  Service: General;  Laterality:  Right;  . Colonoscopy 02/19/2011    Procedure: COLONOSCOPY;  Surgeon: Missy Sabins, MD;  Location: [place];  Service: Endoscopy;  Laterality: N/A;    Discharge Planning  Discharge Planning Living Arrangements: Alone Support Systems: Children Assistance Needed: prn from children Type of Residence: Private residence Expected Discharge Date: 03/08/11 Case Management Consult Needed:  (following)  Social/Family/Support Systems Social/Family/Support Systems Anticipated Caregiver's Contact Information: Mateo Flow @ [phone] and friend, Hassan Rowan @ [phone] Ability/Limitations of Caregiver: none Caregiver Availability: 24/7  Employment Status    Abuse/Neglect Abuse/Neglect Assessment (Assessment to be complete while patient is alone) Physical Abuse: Denies Verbal Abuse: Denies Sexual Abuse: Denies Exploitation of patient/patient's resources: Denies Self-Neglect: Denies  Emotional Status Emotional Status Pt's affect, behavior adn adjustment status: unchanged - yet pt much more talkative and brighter affect than first encounter Recent Psychosocial Issues: none Pyschiatric History: none  Patient/Family Perceptions, Expectations & Goals Pt/Family Perceptions, Expectations and Goals Pt/Family understanding of illness & functional limitations: unchanged Premorbid pt/family roles/activities: unchanged Anticipated changes in roles/activities/participation: will require 24 hour assist/ supervision Pt/family expectations/goals: "to be able to do what I can for myself"  [facility] Resources    Discharge Assessment Discharge Planning Patient/Family Preliminary Plans: Daugher,  Mateo Flow, plans to stay with patient at pt's home  Clinical Impression:  Familiar woman from recent CIR - no change in her general info.  Much more talkative and brighter affect this time.  Very motivated for CIR as she feels "alot better"  Jordan Hawks, [place] 02/28/2011

## 2011-02-28 NOTE — Progress Notes (Signed)
I have seen and examined this patient and agree with the plan of care dialysis MWF  [facility] W 02/28/2011, 9:01 AM

## 2011-02-28 NOTE — Progress Notes (Signed)
Occupational Therapy Session Note  Patient Details  Name: [name] MRN: [id number] Date of Birth: 26-Nov-1950  Today's Date: 02/28/2011 Time: [id number] Time Calculation (min): 55 min Pain: Mid session, patient complained of 9/10 pain throughout LLE; RN notified.   Precautions:  Precautions: Back Required Braces or Orthoses: Yes Spinal Brace: Lumbar corset Restrictions Weight Bearing Restrictions: No  Short Term Goals: OT Short Term Goal 1: Patient will perform transfers at min assist level using adaptive equipment prn OT Short Term Goal 2: Patient will be able to verbalize and adhere to 3/3 back preacautions independently  OT Short Term Goal 3: Patient will donn back brace with set-up assist OT Short Term Goal 4: ADL short term goals = ADL long term goals  Skilled Therapeutic Interventions/Progress Updates:    Upon arrival, found patient seated edge of bed with back brace donned. Transferred from edge of bed to w/c with moderate assist. Engaged in ADL retraining at sink level focusing on UB bathing, UB dressing, donning back brace, sit to stand x1, and increasing overall activity tolerance/endurance. During ADL retraining at sink, patient c/o 9/10 pain throughout LLE (RN made aware). Transferred from w/c back to bed to complete LB bathing and dressing at bed level. Encouraged patient to get back in w/c to sit up for awhile, patient agreeable; transferred back to w/c (squat pivot with mod assist). Left patient with phone and call bell within reach.    Therapy/Group: Individual Therapy  Trany Chernick 02/28/2011, 12:16 PM

## 2011-02-28 NOTE — Progress Notes (Signed)
Subjective: Interval History: has complaints back pain.  .  Objective: Vital signs in last 24 hours:  Temp:  [98.5 F (36.9 C)-99.3 F (37.4 C)] 98.5 F (36.9 C) (12/13 0642) Pulse Rate:  [106-119] 106  (12/13 0642) Resp:  [20] 20  (12/13 0642) BP: (121-124)/(74-79) 124/79 mmHg (12/13 0642) SpO2:  [97 %] 97 % (12/13 0642) Weight:  [54.931 kg (121 lb 1.6 oz)-57.8 kg (127 lb 6.8 oz)] 121 lb 1.6 oz (54.931 kg) (12/13 0642)  Weight change: 1.5 kg (3 lb 4.9 oz)  Intake/Output: I/O last 3 completed shifts: In: 360 [P.O.:360] Out: 2800 [Other:2350; Stool:450]   Intake/Output this shift:     Heart:RRR, 2/6 sem LSB  Lungs:CTA  Abdomen:bs pos. Colostomy bag in place with stool . Surgical site healing with steri strips in place.  Extremities: Dialysis Access:  Bruit rt upper arm  Trace edema  EXT- no edema  Lab Results:  [facility] 02/27/11 0331  WBC 15.8*  HGB 9.8*  HCT 30.4*  PLT 553*   BMET  Basename 02/27/11 0331  NA 138  K 4.4  CL 99  CO2 24  GLUCOSE 90  BUN 27*  CREATININE 7.37*  CALCIUM 8.5  PHOS 3.6   LFT  Basename 02/27/11 0331  PROT --  ALBUMIN 1.7*  AST --  ALT --  ALKPHOS --  BILITOT --  BILIDIR --  IBILI --   PT/INR No results found for this basename: LABPROT:2,INR:2 in the last 72 hours Hepatitis Panel No results found for this basename: HEPBSAG,HCVAB,HEPAIGM,HEPBIGM in the last 72 hours  Studies/Results: No results found.  I have reviewed the patient's current medications.  Assessment/Plan: 1.Lumbar stenosis with radiculopathy-S/P laminectomy; Now Back on rehab.  2.Ischemic Colitis-S/P right hemicolectomy and ileostomy 11/24. Now off all antibiotics. Daily PPI; tolerating meals  3.ESRD-TTS [place]; use albumin With uf 1.5 liters tomor. On hd  4. Anemia of Renal Failure-Hgb 9.8 Max Aranesp; appears stable; T-Sat 20% 11/26 with high Ferritin (2404); no IV iron  5. Secondary hyperparathyroidism - phos good; Tums with meals; no vitamin D;  follow closely secondary poor appetite  6. HTN-controlled; however, some pedal edema,UF as Tol With HD And albumin for bp support . Plan dialysis today. To keep on schedule 7. Nutrition - severe protein/calorie malnutrition with albumin 1.5 02/20/11; appetite poor, has Nepro; add Megace .  Patient now getting rehab will continue dialysis TTS to keep on schedule   LOS: 3 Giann Obara W @TODAY @9 :02 AM

## 2011-02-28 NOTE — Progress Notes (Signed)
Patient ID: [name], female   DOB: 05-09-1950, 60 y.o.   MRN: [id number] Patient ID: [name], female   DOB: Oct 28, 1950, 60 y.o.   MRN: [id number] Patient ID: [name], female   DOB: Sep 27, 1950, 60 y.o.   MRN: [id number] Subjective/Complaints: Review of Systems  Musculoskeletal: Positive for back pain.  All other systems reviewed and are negative.  No new issues or complaints today.   Objective: Vital Signs: Blood pressure 124/79, pulse 106, temperature 98.5 F (36.9 C), temperature source Oral, resp. rate 20, height 5\' 5"  (1.651 m), weight 54.931 kg (121 lb 1.6 oz), SpO2 97.00%. No results found.  Basename 02/27/11 0331  WBC 15.8*  HGB 9.8*  HCT 30.4*  PLT 553*    Basename 02/27/11 0331  NA 138  K 4.4  CL 99  CO2 24  GLUCOSE 90  BUN 27*  CREATININE 7.37*  CALCIUM 8.5   CBG (last 3)  No results found for this basename: GLUCAP:3 in the last 72 hours  Wt Readings from Last 3 Encounters:  02/28/11 54.931 kg (121 lb 1.6 oz)  02/24/11 56.4 kg (124 lb 5.4 oz)  02/24/11 56.4 kg (124 lb 5.4 oz)    Physical Exam:  General appearance: alert, cooperative and no distress Head: Normocephalic, without obvious abnormality, atraumatic Eyes: conjunctivae/corneas clear. PERRL, EOM's intact. Fundi benign. Ears: normal TM's and external ear canals both ears Nose: Nares normal. Septum midline. Mucosa normal. No drainage or sinus tenderness. Throat: lips, mucosa, and tongue normal; teeth and gums normal Neck: no adenopathy, no carotid bruit, no JVD, supple, symmetrical, trachea midline and thyroid not enlarged, symmetric, no tenderness/mass/nodules Back: symmetric, no curvature. ROM normal. No CVA tenderness. Resp: clear to auscultation bilaterally Cardio: regular rate and rhythm, S1, S2 normal, no murmur, click, rub or gallop GI: soft, non-tender; bowel sounds normal; no masses,  no organomegaly Extremities: extremities normal, atraumatic, no cyanosis or edema  slight edema distally only Pulses: 2+ and symmetric Skin: Skin color, texture, turgor normal. No rashes or lesions Neurologic: continued foot drop tr/5 but pt denies sensory changes. Plantarflexion 3-4/5.  Generalized weakness in proximal legs 3/5.  Heel cords loose Incision/Wound: wounds all clean and intact.  Ostomy site sealed and stool of normal consistency   Assessment/Plan: 1. Functional deficits secondary to lumbar stenosis with radiculopathy s/p decompression and ischemic colitis requiring colectomy which require 3+ hours per day of interdisciplinary therapy in a comprehensive inpatient rehab setting. Physiatrist is providing close team supervision and 24 hour management of active medical problems listed below. Physiatrist and rehab team continue to assess barriers to discharge/monitor patient progress toward functional and medical goals. Mobility: Bed Mobility Bed Mobility: Yes Rolling Right: 4: Min assist;With rail Rolling Right Details (indicate cue type and reason): Cues to bend bil LEs and roll completely before pushign up to sit Left Sidelying to Sit: 3: Mod assist Left Sidelying to Sit Details (indicate cue type and reason): Increased effort and pain with coming up to sit'  Transfers Transfers: Yes Sit to Stand: 2: Max assist;With upper extremity assist;From chair/3-in-1 Stand to Sit: 2: Max assist;With upper extremity assist;To chair/3-in-1 Squat Pivot Transfers: 3: Mod assist Ambulation/Gait Ambulation/Gait Assistance: 2: Max assist Ambulation/Gait Assistance Details (indicate cue type and reason): A for LE lifting and placing due to bil LE foot drop, A for seaight shifting to unweight opposite LE, blocking knees Ambulation Distance (Feet): 8 Feet Assistive device: Rolling walker Gait Pattern: Decreased dorsiflexion - right;Decreased dorsiflexion - left;Right foot flat;Left foot  flat;Right genu recurvatum;Left genu recurvatum;Trunk flexed;Right hip hike Stairs: No  (Unsafe t this time) Architect: Yes Wheelchair Assistance: 4: Min Lexicographer: Both upper extremities Wheelchair Parts Management: Supervision/cueing (Cues for maintaining back precautions with parts management) Distance: 150 ADL:   Cognition: Cognition Overall Cognitive Status: Appears within functional limits for tasks assessed Arousal/Alertness: Awake/alert Orientation Level: Oriented X4 Memory: Appears intact Awareness: Appears intact Problem Solving: Appears intact Safety/Judgment: Appears intact Cognition Arousal/Alertness: Awake/alert Orientation Level: Oriented X4  1.Lumbar stenosis with radiculopathy-S/P laminectomy Lumbar 4-5 and L 5 S1 11/14.Back corset when out of bed.Routine back precautions. Consider b/l AFO's. Has AFO's in bed.  Will almost certainly need AFO's for gait.  2. DVT Prophylaxis/Anticoagulation: SCD,s/ and support hose.Monitor for any signs of DVT.   3. Pain Management: percocet prn.Monitor with increased activity. Will schedule a percocet with am and afternoon therapies. Doing very well with pain thi am.  4.Ischemic Colitis-S/P exp lap with right hemicolectomy and ileostomy 11/24.All antibiotics discontinued.Monitor wound site.Provide ileostomy care and education. She is doing some ostomy care already on her own. WBC's up today.  Afebrile.  Clinically improved. Recheck WBC's today.  5.ESRD-dialysis as directed per renal services.Weigh patient daily.Follow up labs with dialysis.  Pt was frustrated by HD on Tuesday which ended up happening at 0300!  6Chronic anemia-aranesp with HD.Follow up CBC with HD and transfuse if symptomatic.   7.HTN-No present meds and monitor with increased activity.   3. Mood: ego support. She seems to be in great spirits and quite motivated. Will follow closely.      LOS (Days) 3 A FACE TO FACE VISIT WAS PERFORMED WITH THIS PATIENT  Reade Trefz T 02/28/2011, 8:10 AM

## 2011-02-28 NOTE — Progress Notes (Signed)
Physical Therapy Session Note  Patient Details  Name: [name] MRN: [id number] Date of Birth: [birth year]  Today's Date: 02/28/2011 Time: 1030-1102 Time Calculation (min): 32 min  Precautions: Precautions Precautions: Back Precaution Booklet Issued: No  Pain:  Pt reported no pain.  Skilled Therapeutic Interventions/Progress Updates:   Pt performed 15 reps of LE exercise in the bed for strengthening, included:  Heel slides, SAQ, glut sets, hip AB/AD, isometric hip ADD, ankle pumps (assisted dorsiflexion, resisted plantarflexion).  Note pt with 0/5 dorsiflexion bilaterally.  Supine to sit with supervision.  Plan was to get into w/c, but colostomy bag needed to be changed.  Pt left with RN to address at end of session.    Therapy/Group: Individual Therapy  Waylan Boga 02/28/2011, 12:15 PM

## 2011-02-28 NOTE — Progress Notes (Addendum)
Per State Regulation 482.30 This chart was reviewed for medical necessity with respect to the patient's Admission/Duration of stay. Pt was given Team Conf report on Tuesday afternoon & her daughter, Mateo Flow, was given update by phone this afternoon:  03/08/11 d/c date w/ supervision - min assist goals [mostly w/c level].   Pt receiving education for ostomy care.  Pt is participating w/ therapies.  MD may recommend AFOs because of foot drop-also, working on pain management.   Theora Master                 Nurse Care Manager              Next Review Date: 03/05/11

## 2011-02-28 NOTE — Progress Notes (Signed)
Physical Therapy Note  Patient Details  Name: AMIRRAH SAVARESE MRN: [id number] Date of Birth: [birth year] Today's Date: 02/28/2011  Individual therapy 1345-1430 (45 min) Premedicated for pain. Attempted to put bilateral AFO's and shoes on, but pt's ankles/feet too edematous to get shoes or AFOs on. Changed leg rests to elevating leg rests and discussed with pt trying to keeps legs elevated when resting in the chair or in the bed. W/c mobility on unit for general UE strengthening overall supervision. Squat pivot transfers with overall steadyA to/from w/c to Nustep. Nustep for general strengthening, activity tolerance, and increased circulation in lower extremities on level 3 x 8 minutes. Set pt up with foot soak back in the room per pt request.   Individual therapy 1530-1625 (55 min) Complain of 8/10 abdominal pain -notified RN for pain medicine. Applied [place] to help with edema as well. Standing therex for functional strengthening with sit to stands with RW minA/close S, marching in place, weightshifting, and mini squats x 10 reps each, x 2 sets. Practiced stand step transfer with RW with minA to w/c. W/c propulsion on unit to and from therapy for general activity tolerance and mobility.  Canary Brim [facility] 02/28/2011, 4:29 PM

## 2011-03-01 DIAGNOSIS — IMO0002 Reserved for concepts with insufficient information to code with codable children: Secondary | ICD-10-CM

## 2011-03-01 DIAGNOSIS — M47817 Spondylosis without myelopathy or radiculopathy, lumbosacral region: Secondary | ICD-10-CM

## 2011-03-01 DIAGNOSIS — N186 End stage renal disease: Secondary | ICD-10-CM

## 2011-03-01 DIAGNOSIS — Z5189 Encounter for other specified aftercare: Secondary | ICD-10-CM

## 2011-03-01 LAB — GLUCOSE, CAPILLARY: Glucose-Capillary: 111 mg/dL — ABNORMAL HIGH (ref 70–99)

## 2011-03-01 MED ORDER — HEPARIN SODIUM (PORCINE) 1000 UNIT/ML DIALYSIS
1000.0000 [IU] | INTRAMUSCULAR | Status: DC | PRN
  Filled 2011-03-01: qty 1

## 2011-03-01 MED ORDER — SODIUM CHLORIDE 0.9 % IV SOLN: 100.0000 mL | INTRAVENOUS | Status: DC | PRN

## 2011-03-01 MED ORDER — SODIUM CHLORIDE 0.9 % IJ SOLN
10.0000 mL | INTRAMUSCULAR | Status: DC | PRN
  Administered 2011-03-01 – 2011-03-12 (×19): 10 mL
  Administered 2011-03-15: 3 mL

## 2011-03-01 MED ORDER — ALTEPLASE 2 MG IJ SOLR
2.0000 mg | Freq: Once | INTRAMUSCULAR | Status: AC | PRN
  Filled 2011-03-01: qty 2

## 2011-03-01 MED ORDER — LIDOCAINE-PRILOCAINE 2.5-2.5 % EX CREA
1.0000 "application " | TOPICAL_CREAM | CUTANEOUS | Status: DC | PRN
  Filled 2011-03-01: qty 5

## 2011-03-01 MED ORDER — NEPRO/CARBSTEADY PO LIQD: 237.0000 mL | ORAL | Status: DC | PRN

## 2011-03-01 MED ORDER — PENTAFLUOROPROP-TETRAFLUOROETH EX AERO: 1.0000 "application " | INHALATION_SPRAY | CUTANEOUS | Status: DC | PRN

## 2011-03-01 MED ORDER — HEPARIN SODIUM (PORCINE) 1000 UNIT/ML DIALYSIS
20.0000 [IU]/kg | INTRAMUSCULAR | Status: DC | PRN
  Administered 2011-03-02: 1100 [IU] via INTRAVENOUS_CENTRAL
  Filled 2011-03-01: qty 2

## 2011-03-01 MED ORDER — LIDOCAINE HCL (PF) 1 % IJ SOLN
5.0000 mL | INTRAMUSCULAR | Status: DC | PRN
  Filled 2011-03-01: qty 5

## 2011-03-01 NOTE — Progress Notes (Signed)
Subjective: Interval History: none.  Objective: Vital signs in last 24 hours:  Temp:  [98.1 F (36.7 C)-98.4 F (36.9 C)] 98.4 F (36.9 C) (12/14 0520) Pulse Rate:  [107-129] 114  (12/14 0520) Resp:  [16-20] 20  (12/14 0520) BP: (119-153)/(70-92) 119/70 mmHg (12/14 0520) SpO2:  [93 %-99 %] 99 % (12/14 0520) Weight:  [53.5 kg (117 lb 15.1 oz)-55.8 kg (123 lb 0.3 oz)] 118 lb 13.3 oz (53.9 kg) (12/14 0520)  Weight change: -2 kg (-4 lb 6.5 oz)  Intake/Output: I/O last 3 completed shifts: In: 600 [P.O.:600] Out: 2160 [Other:1910; Stool:250]   Intake/Output this shift:     Heart:RRR, 2/6 sem LSB  Lungs:CTA  Abdomen:bs pos. Colostomy bag in place with stool . Surgical site healing with steri strips in place.  Extremities: Dialysis Access: Bruit rt upper arm  Trace edema   Lab Results:  Basename 02/28/11 2236 02/28/11 0545 02/27/11 0331  WBC 15.8* 15.4* 15.8*  HGB 9.4* 10.3* 9.8*  HCT 29.3* 32.6* 30.4*  PLT 471* 503* 553*   BMET  Basename 02/28/11 2236 02/27/11 0331  NA 135 138  K 3.9 4.4  CL 95* 99  CO2 26 24  GLUCOSE 111* 90  BUN 22 27*  CREATININE 5.76* 7.37*  CALCIUM 9.1 8.5  PHOS 2.5 3.6   LFT  Basename 02/28/11 2236  PROT --  ALBUMIN 1.8*  AST --  ALT --  ALKPHOS --  BILITOT --  BILIDIR --  IBILI --   PT/INR No results found for this basename: LABPROT:2,INR:2 in the last 72 hours Hepatitis Panel No results found for this basename: HEPBSAG,HCVAB,HEPAIGM,HEPBIGM in the last 72 hours  Studies/Results: No results found.  I have reviewed the patient's current medications.  Assessment/Plan: 1.Lumbar stenosis with radiculopathy-S/P laminectomy; Now Back on rehab.  2.Ischemic Colitis-S/P right hemicolectomy and ileostomy 11/24.   3.ESRD-TTS [place]; use albumin  4. Anemia of Renal Failure-Hgb 9.4 Max Aranesp; appears stable; T-Sat 20% 11/26 with high Ferritin (2404); no IV iron  5. Secondary hyperparathyroidism - no vitamin D;   6. HTN-controlled;    . Plan dialysis tts 7. Nutrition - severe protein/calorie malnutrition with albumin 1.5 02/20/11; appetite poor, has Nepro; add Megace .  Patient now getting rehab will continue dialysis TTS to keep on schedule      LOS: 4 Marquis Diles W @TODAY @9 :20 AM

## 2011-03-01 NOTE — Progress Notes (Signed)
Pt alert and oriented x 4. Min assist to w/c. Dons corsett when OOB. Ileostomy to RLQ. Bilateral foot drop noted. Adb incision with steri strips approximated Lumbar incision approximated with derma bond OTA. RUA graft +/+. R IJ double lumen capped.

## 2011-03-01 NOTE — Progress Notes (Signed)
Occupational Therapy Session Note  Patient Details  Name: KRITHIKA ROMANIELLO MRN: [id number] Date of Birth: [birth year]  Today's Date: 03/01/2011 Time: [id number] Time Calculation (min): 40 min  Precautions: Precautions Precautions: Back Precaution Booklet Issued: No Precaution Comments: Pt. recalled 3/3 back precautions with min cues. Donned back brace with Mod verbal cues Required Braces or Orthoses: Yes Spinal Brace: Lumbar corset Restrictions Weight Bearing Restrictions: No  Short Term Goals: OT Short Term Goal 1: Patient will perform transfers at min assist level using adaptive equipment prn OT Short Term Goal 2: Patient will be able to verbalize and adhere to 3/3 back preacautions independently  OT Short Term Goal 3: Patient will donn back brace with set-up assist OT Short Term Goal 4: ADL short term goals = ADL long term goals  Skilled Therapeutic Interventions/Progress Updates:  No complaints of pain  Upon entering room, found patient supine in bed; NT emptying colostomy bag with noticeable hole & drainage. RN notified. Secondary to leakage, patient requested to complete dressing in bed and refused bathing, stating "I'm clean!". Encouraged patient to doff pants and donn pants independently (with set-up assit), patient asked for help even though she is able to physically complete task. Therapist donned bilateral shoes with AFOs. Notified nurse that colostomy bag needed to be changed in order for patient to participate in therapy/get out of bed.  Therapy/Group: Individual Therapy  Tajah Schreiner 03/01/2011, 10:33 AM

## 2011-03-01 NOTE — Progress Notes (Signed)
Patient ID: [name], female   DOB: Apr 24, 1950, 60 y.o.   MRN: [id number] Patient ID: Jenna BOURDON, female   DOB: [birth year], 60 y.o.   MRN: [id number] Patient ID: [name], female   DOB: 1950-07-04, 60 y.o.   MRN: [id number] Patient ID: [name], female   DOB: 06-06-1950, 60 y.o.   MRN: [id number] Subjective/Complaints: Review of Systems  Musculoskeletal: Positive for back pain.  All other systems reviewed and are negative.  Patient exhausted from late night HD.   Objective: Vital Signs: Blood pressure 119/70, pulse 114, temperature 98.4 F (36.9 C), temperature source Oral, resp. rate 20, height 5\' 5"  (1.651 m), weight 53.9 kg (118 lb 13.3 oz), SpO2 99.00%. No results found.  Basename 02/28/11 2236 02/28/11 0545  WBC 15.8* 15.4*  HGB 9.4* 10.3*  HCT 29.3* 32.6*  PLT 471* 503*    Basename 02/28/11 2236 02/27/11 0331  NA 135 138  K 3.9 4.4  CL 95* 99  CO2 26 24  GLUCOSE 111* 90  BUN 22 27*  CREATININE 5.76* 7.37*  CALCIUM 9.1 8.5   CBG (last 3)  No results found for this basename: GLUCAP:3 in the last 72 hours  Wt Readings from Last 3 Encounters:  03/01/11 53.9 kg (118 lb 13.3 oz)  02/24/11 56.4 kg (124 lb 5.4 oz)  02/24/11 56.4 kg (124 lb 5.4 oz)    Physical Exam:  General appearance: alert, cooperative and no distress Head: Normocephalic, without obvious abnormality, atraumatic Eyes: conjunctivae/corneas clear. PERRL, EOM's intact. Fundi benign. Ears: normal TM's and external ear canals both ears Nose: Nares normal. Septum midline. Mucosa normal. No drainage or sinus tenderness. Throat: lips, mucosa, and tongue normal; teeth and gums normal Neck: no adenopathy, no carotid bruit, no JVD, supple, symmetrical, trachea midline and thyroid not enlarged, symmetric, no tenderness/mass/nodules Back: symmetric, no curvature. ROM normal. No CVA tenderness. Resp: clear to auscultation bilaterally Cardio: regular rate and rhythm, S1, S2 normal, no  murmur, click, rub or gallop GI: soft, non-tender; bowel sounds normal; no masses,  no organomegaly Extremities: extremities normal, atraumatic, no cyanosis or edema slight edema distally only Pulses: 2+ and symmetric Skin: Skin color, texture, turgor normal. No rashes or lesions Neurologic: continued foot drop tr/5 but pt denies sensory changes. Plantarflexion 3-4/5.  Generalized weakness in proximal legs 3/5.  Heel cords loose Incision/Wound: wounds all clean and intact.  Ostomy site sealed and stool of normal consistency   Assessment/Plan: 1. Functional deficits secondary to lumbar stenosis with radiculopathy s/p decompression and ischemic colitis requiring colectomy which require 3+ hours per day of interdisciplinary therapy in a comprehensive inpatient rehab setting. Physiatrist is providing close team supervision and 24 hour management of active medical problems listed below. Physiatrist and rehab team continue to assess barriers to discharge/monitor patient progress toward functional and medical goals.  ?E-Stim to lower exts  Mobility: Bed Mobility Bed Mobility: Yes Rolling Right: 4: Min assist;With rail Rolling Right Details (indicate cue type and reason): Cues to bend bil LEs and roll completely before pushign up to sit Left Sidelying to Sit: 3: Mod assist Left Sidelying to Sit Details (indicate cue type and reason): Increased effort and pain with coming up to sit'  Transfers Transfers: Yes Sit to Stand: 2: Max assist;With upper extremity assist;From chair/3-in-1 Stand to Sit: 2: Max assist;With upper extremity assist;To chair/3-in-1 Squat Pivot Transfers: 3: Mod assist Ambulation/Gait Ambulation/Gait Assistance: 2: Max assist Ambulation/Gait Assistance Details (indicate cue type and reason): A for LE lifting and  placing due to bil LE foot drop, A for seaight shifting to unweight opposite LE, blocking knees Ambulation Distance (Feet): 8 Feet Assistive device: Rolling  walker Gait Pattern: Decreased dorsiflexion - right;Decreased dorsiflexion - left;Right foot flat;Left foot flat;Right genu recurvatum;Left genu recurvatum;Trunk flexed;Right hip hike Stairs: No (Unsafe t this time) Architect: Yes Wheelchair Assistance: 4: Min Lexicographer: Both upper extremities Wheelchair Parts Management: Supervision/cueing (Cues for maintaining back precautions with parts management) Distance: 150 ADL:   Cognition: Cognition Overall Cognitive Status: Appears within functional limits for tasks assessed Arousal/Alertness: Awake/alert Orientation Level: Oriented X4 Memory: Appears intact Awareness: Appears intact Problem Solving: Appears intact Safety/Judgment: Appears intact Cognition Arousal/Alertness: Awake/alert Orientation Level: Oriented X4  1.Lumbar stenosis with radiculopathy-S/P laminectomy Lumbar 4-5 and L 5 S1 11/14.Back corset when out of bed.Routine back precautions. Consider b/l AFO's. Has AFO's in bed.  Will almost certainly need AFO's for gait.  2. DVT Prophylaxis/Anticoagulation: SCD,s/ and support hose.Monitor for any signs of DVT.   3. Pain Management: percocet prn.Monitor with increased activity. Will schedule a percocet with am and afternoon therapies. Doing very well with pain thi am.  4.Ischemic Colitis-S/P exp lap with right hemicolectomy and ileostomy 11/24.All antibiotics discontinued.Monitor wound site.Provide ileostomy care and education. She is doing some ostomy care already on her own. WBC's up today.  Afebrile.  Clinically improved. Recheck WBC's today.  5.ESRD-dialysis as directed per renal services.Weigh patient daily.Follow up labs with dialysis.  Pt was frustrated by late HD on Tuesday and it happened late again last night.   6Chronic anemia-aranesp with HD.Follow up CBC with HD and transfuse if symptomatic.   7.HTN-No present meds and monitor with increased activity.   3. Mood:  ego support. She seems to be in great spirits and quite motivated. Will follow closely.      LOS (Days) 4 A FACE TO FACE EVALUATION WAS PERFORMED  Ai Sonnenfeld T 03/01/2011, 7:32 AM

## 2011-03-01 NOTE — Progress Notes (Signed)
Physical Therapy Note  Patient Details  Name: KATNISS MCHARGUE MRN: [id number] Date of Birth: October 30, 1950 Today's Date: 03/01/2011  Individual therapy 1045-1115 (30 minutes, missed 15 minutes beginning of session due to nursing care for colostomy). Pt complaint of back pain with mobility but premedicated per RN. W/c propulsion for general mobility and UE strengthening. Focused on sit to stands, standing tolerance at tabletop for functional task. Pt with no eccentric control into chair but verbalized she "knew I sat down wrong." Limited standing tolerance due to fatigue and pain.    Canary Brim [facility] 03/01/2011, 12:07 PM

## 2011-03-01 NOTE — Progress Notes (Signed)
Physical Therapy Note  Patient Details  Name: [name] MRN: [id number] Date of Birth: Jun 23, 1950 Today's Date: 03/01/2011  PM sessions Treatment #1: Individual therapy 1300-1400 (60 min) Premedicated for pain. Focused on supine therex for lower extremity strengthening with mostly AROM on right lower extremity and AAROM on left lower extremity for SAQ, hip abduction, and heel slides. Pt reporting AFOs were too uncomfortable due to increased swelling in ankles, gait training without AFOs requiring modA with RW x 15', steppage gait, decreased clearance bilaterally due to foot drop, and flexed posture. Pt request to have feet soaked in warm water, set pt up seated edge of w/c.   Treatment #2: Individual therapy 1515-1600 (45 min) No complaint of pain. Focused on bed mobility re-training in ADL apartment for supine <-> sit with maintaining back precautions and managing lower extremities. Introduced the leg lifter to pt to assist with left lower extremity, and pt able to maintain precautions and manage lowers much better. Pt plans to have family purchase one for her to use at home. Determined it is easier for her to lay down to her right side than left side. Transfers were close S squat pivot.   [name] [facility] 03/01/2011, 3:02 PM

## 2011-03-02 ENCOUNTER — Inpatient Hospital Stay (HOSPITAL_COMMUNITY): Payer: Medicare Other

## 2011-03-02 DIAGNOSIS — M47817 Spondylosis without myelopathy or radiculopathy, lumbosacral region: Secondary | ICD-10-CM

## 2011-03-02 DIAGNOSIS — Z5189 Encounter for other specified aftercare: Secondary | ICD-10-CM

## 2011-03-02 DIAGNOSIS — N186 End stage renal disease: Secondary | ICD-10-CM

## 2011-03-02 DIAGNOSIS — IMO0002 Reserved for concepts with insufficient information to code with codable children: Secondary | ICD-10-CM

## 2011-03-02 LAB — CBC
HCT: 30.9 % — ABNORMAL LOW (ref 36.0–46.0)
Hemoglobin: 9.8 g/dL — ABNORMAL LOW (ref 12.0–15.0)
MCH: 28.7 pg (ref 26.0–34.0)
MCHC: 31.7 g/dL (ref 30.0–36.0)
MCV: 90.4 fL (ref 78.0–100.0)
RDW: 16.9 % — ABNORMAL HIGH (ref 11.5–15.5)

## 2011-03-02 LAB — RENAL FUNCTION PANEL
Albumin: 1.9 g/dL — ABNORMAL LOW (ref 3.5–5.2)
BUN: 21 mg/dL (ref 6–23)
Calcium: 9.6 mg/dL (ref 8.4–10.5)
Creatinine, Ser: 5.75 mg/dL — ABNORMAL HIGH (ref 0.50–1.10)
Glucose, Bld: 116 mg/dL — ABNORMAL HIGH (ref 70–99)
Phosphorus: 2 mg/dL — ABNORMAL LOW (ref 2.3–4.6)
Potassium: 4.2 mEq/L (ref 3.5–5.1)

## 2011-03-02 LAB — GLUCOSE, CAPILLARY: Glucose-Capillary: 91 mg/dL (ref 70–99)

## 2011-03-02 NOTE — Progress Notes (Signed)
Occupational Therapy Session Note  Patient Details  Name: MILEA VERNI MRN: [id number] Date of Birth: 23-Nov-1950  Today's Date: 03/02/2011 Time:  - 145-230    Precautions: Precautions Precautions: Back Precaution Booklet Issued: No Precaution Comments: Pt. recalled 3/3 back precautions with min cues. Donned back brace with Mod verbal cues Required Braces or Orthoses: Yes Spinal Brace: Lumbar corset Restrictions Weight Bearing Restrictions: No  Short Term Goals: OT Short Term Goal 1: Patient will perform transfers at min assist level using adaptive equipment prn OT Short Term Goal 2: Patient will be able to verbalize and adhere to 3/3 back preacautions independently  OT Short Term Goal 3: Patient will donn back brace with set-up assist OT Short Term Goal 4: ADL short term goals = ADL long term goals  Skilled Therapeutic Interventions/Progress Updates:    Engaged in functional mobility at RW  Level.  Pt. Required moderate assist and facilitation of weight shifting.  Pt. Verbalized 3/3 back precautions.     Pain  2/10  Right groin pain    Therapy/Group: Individual Therapy  Lisa Roca 03/02/2011, 2:16 PM

## 2011-03-02 NOTE — Progress Notes (Signed)
Subjective: Interval History: none.  Objective: Vital signs in last 24 hours:  Temp:  [98.8 F (37.1 C)-99 F (37.2 C)] 99 F (37.2 C) (12/15 0300) Pulse Rate:  [108-118] 118  (12/15 0300) Resp:  [16-18] 18  (12/15 0300) BP: (114-125)/(72-81) 125/81 mmHg (12/15 0300) SpO2:  [95 %-97 %] 95 % (12/15 0300) Weight:  [53.9 kg (118 lb 13.3 oz)] 118 lb 13.3 oz (53.9 kg) (12/15 0300)  Weight change: -1.9 kg (-4 lb 3 oz)  Intake/Output: I/O last 3 completed shifts: In: 12 [P.O.:720] Out: 2110 [Other:1910; Stool:200]   Intake/Output this shift:     Heart:RRR, 2/6 sem LSB  Lungs:CTA  Abdomen:bs pos. Colostomy bag in place with stool . Surgical site healing with steri strips in place.  Extremities: Dialysis Access: Bruit rt upper arm  Trace edema   Lab Results:  [facility] 02/28/11 2236 02/28/11 0545  WBC 15.8* 15.4*  HGB 9.4* 10.3*  HCT 29.3* 32.6*  PLT 471* 503*   BMET  Basename 02/28/11 2236  NA 135  K 3.9  CL 95*  CO2 26  GLUCOSE 111*  BUN 22  CREATININE 5.76*  CALCIUM 9.1  PHOS 2.5   LFT  Basename 02/28/11 2236  PROT --  ALBUMIN 1.8*  AST --  ALT --  ALKPHOS --  BILITOT --  BILIDIR --  IBILI --   PT/INR No results found for this basename: LABPROT:2,INR:2 in the last 72 hours Hepatitis Panel No results found for this basename: HEPBSAG,HCVAB,HEPAIGM,HEPBIGM in the last 72 hours  Studies/Results: No results found.  I have reviewed the patient's current medications.  Assessment/Plan: 1.Lumbar stenosis with radiculopathy-S/P laminectomy; Now Back on rehab.  2.Ischemic Colitis-S/P right hemicolectomy and ileostomy 11/24.  3.ESRD-TTS [place]; use albumin  4. Anemia of Renal Failure-Hgb 9.4 Max Aranesp; appears stable; T-Sat 20% 11/26 with high Ferritin (2404); no IV iron  5. Secondary hyperparathyroidism - no vitamin D;  6. HTN-controlled; . Plan dialysis tts  7. Nutrition - severe protein/calorie malnutrition with albumin 1.5 02/20/11; appetite poor,  has Nepro; add Megace .  Patient now getting rehab will continue dialysis TTS to keep on schedule      LOS: 5 Sarahjane Matherly W @TODAY @7 :43 AM

## 2011-03-02 NOTE — Progress Notes (Signed)
Patient alert and oriented x 3 with some STMD noted. Patient has called appropriately while sitting in w/c. Patient has some decreased sensation to right foot and right foot drop. Patient wears corset OOB. Patient able to stand pivot with min assist. Patient has RUA graft that is positive bruit and thrill. Patient does have some STMD and confusion at times. Patient has ileosotomy with ileostomy care done and taught. Patient has neck IJ intact. Patient back incision mostly healed- open to air. Patient has mid line incision with steris intact. Patient has allevyn intact to sacrum over reported stage II. Patient receiving scheduled percocet for pain with lowest pain scored at a 6 this shift. Patient was asleep upon reassessment of second pain medication dose. Patient gone to dialysis around 1530. Patient does not void. Appetite poor at times. Continue with plan of care.

## 2011-03-02 NOTE — Progress Notes (Signed)
Patient ID: [name], female   DOB: 1950-10-28, 60 y.o.   MRN: [id number] Subjective/Complaints: Review of Systems  Musculoskeletal: Positive for back pain.  All other systems reviewed and are negative.  Patient exhausted from late night HD.   Objective: Vital Signs: Blood pressure 125/81, pulse 118, temperature 99 F (37.2 C), temperature source Oral, resp. rate 18, height 5\' 5"  (1.651 m), weight 53.9 kg (118 lb 13.3 oz), SpO2 95.00%. No results found.  Basename 02/28/11 2236 02/28/11 0545  WBC 15.8* 15.4*  HGB 9.4* 10.3*  HCT 29.3* 32.6*  PLT 471* 503*    Basename 02/28/11 2236  NA 135  K 3.9  CL 95*  CO2 26  GLUCOSE 111*  BUN 22  CREATININE 5.76*  CALCIUM 9.1   CBG (last 3)   Basename 03/01/11 2123  GLUCAP 111*    Wt Readings from Last 3 Encounters:  03/02/11 53.9 kg (118 lb 13.3 oz)  02/24/11 56.4 kg (124 lb 5.4 oz)  02/24/11 56.4 kg (124 lb 5.4 oz)    Physical Exam:  General appearance: alert, cooperative and no distress Head: Normocephalic, without obvious abnormality, atraumatic Eyes: conjunctivae/corneas clear. PERRL, EOM's intact. Fundi benign. Ears: normal TM's and external ear canals both ears Nose: Nares normal. Septum midline. Mucosa normal. No drainage or sinus tenderness. Throat: lips, mucosa, and tongue normal; teeth and gums normal Neck: no adenopathy, no carotid bruit, no JVD, supple, symmetrical, trachea midline and thyroid not enlarged, symmetric, no tenderness/mass/nodules Back: symmetric, no curvature. ROM normal. No CVA tenderness. Resp: clear to auscultation bilaterally Cardio: regular rate and rhythm, S1, S2 normal, no murmur, click, rub or gallop GI: soft, non-tender; bowel sounds normal; no masses,  no organomegaly Extremities: extremities normal, atraumatic, no cyanosis or edema slight edema distally only Pulses: 2+ and symmetric Skin: Skin color, texture, turgor normal. No rashes or lesions Neurologic: continued foot drop  tr/5 but pt denies sensory changes. Plantarflexion 3-4/5.  Generalized weakness in proximal legs 3/5.  Heel cords loose Incision/Wound: wounds all clean and intact.  Ostomy site sealed and stool of normal consistency   Assessment/Plan: 1. Functional deficits secondary to lumbar stenosis with radiculopathy s/p decompression and ischemic colitis requiring colectomy which require 3+ hours per day of interdisciplinary therapy in a comprehensive inpatient rehab setting. Physiatrist is providing close team supervision and 24 hour management of active medical problems listed below. Physiatrist and rehab team continue to assess barriers to discharge/monitor patient progress toward functional and medical goals.  ?E-Stim to lower exts  Mobility: Bed Mobility Bed Mobility: Yes Rolling Right: 4: Min assist;With rail Rolling Right Details (indicate cue type and reason): Cues to bend bil LEs and roll completely before pushign up to sit Left Sidelying to Sit: 3: Mod assist Left Sidelying to Sit Details (indicate cue type and reason): Increased effort and pain with coming up to sit'  Transfers Transfers: Yes Sit to Stand: 2: Max assist;With upper extremity assist;From chair/3-in-1 Stand to Sit: 2: Max assist;With upper extremity assist;To chair/3-in-1 Squat Pivot Transfers: 3: Mod assist Ambulation/Gait Ambulation/Gait Assistance: 2: Max assist Ambulation/Gait Assistance Details (indicate cue type and reason): A for LE lifting and placing due to bil LE foot drop, A for seaight shifting to unweight opposite LE, blocking knees Ambulation Distance (Feet): 8 Feet Assistive device: Rolling walker Gait Pattern: Decreased dorsiflexion - right;Decreased dorsiflexion - left;Right foot flat;Left foot flat;Right genu recurvatum;Left genu recurvatum;Trunk flexed;Right hip hike Stairs: No (Unsafe t this time) Product manager Mobility: Yes Wheelchair Assistance: 4: Min  assist Wheelchair Propulsion:  Both upper extremities Wheelchair Parts Management: Supervision/cueing (Cues for maintaining back precautions with parts management) Distance: 150 ADL:   Cognition: Cognition Overall Cognitive Status: Appears within functional limits for tasks assessed Arousal/Alertness: Awake/alert Orientation Level: Oriented X4 Memory: Appears intact Awareness: Appears intact Problem Solving: Appears intact Safety/Judgment: Appears intact Cognition Arousal/Alertness: Awake/alert Orientation Level: Oriented X4  1.Lumbar stenosis with radiculopathy-S/P laminectomy Lumbar 4-5 and L 5 S1 11/14.Back corset when out of bed.Routine back precautions. Consider b/l AFO's. Has AFO's in bed.  Will almost certainly need AFO's for gait.  2. DVT Prophylaxis/Anticoagulation: SCD,s/ and support hose.Monitor for any signs of DVT.   3. Pain Management: percocet prn.Monitor with increased activity. Will schedule a percocet with am and afternoon therapies. Doing very well with pain thi am.  4.Ischemic Colitis-S/P exp lap with right hemicolectomy and ileostomy 11/24.All antibiotics discontinued.Monitor wound site.Provide ileostomy care and education. She is doing some ostomy care already on her own. WBC's up today.  Afebrile.  Clinically improved. Recheck WBC's today.  5.ESRD-dialysis as directed per renal services.Weigh patient daily.Follow up labs with dialysis.  Pt was frustrated by late HD on Tuesday and it happened late again last night.   6Chronic anemia-aranesp with HD.Follow up CBC with HD and transfuse if symptomatic.   7.HTN-No present meds and monitor with increased activity.   8. Mood: ego support. She seems to be in great spirits and quite motivated. Will follow closely.  9. Bilateral foot drop due to Radiculopathy AFOs and PT      LOS (Days) 5 A FACE TO FACE EVALUATION WAS PERFORMED  KIRSTEINS,ANDREW E 03/02/2011, 7:08 AM

## 2011-03-02 NOTE — Progress Notes (Signed)
Pt seen on HD. 2K bath but K is pending. BP 129/81. Try to pull 2 liters

## 2011-03-03 LAB — GLUCOSE, CAPILLARY: Glucose-Capillary: 106 mg/dL — ABNORMAL HIGH (ref 70–99)

## 2011-03-03 MED ORDER — SODIUM CHLORIDE 0.9 % IV SOLN
500.0000 mg | INTRAVENOUS | Status: DC
  Administered 2011-03-03 – 2011-03-08 (×5): 0.5 g via INTRAVENOUS
  Filled 2011-03-03 (×14): qty 0.5

## 2011-03-03 MED ORDER — GABAPENTIN 100 MG PO CAPS
100.0000 mg | ORAL_CAPSULE | Freq: Three times a day (TID) | ORAL | Status: DC
  Administered 2011-03-03: 100 mg via ORAL
  Filled 2011-03-03 (×4): qty 1

## 2011-03-03 MED ORDER — SODIUM CHLORIDE 0.9 % IV SOLN: 100.0000 mL | INTRAVENOUS | Status: DC | PRN

## 2011-03-03 MED ORDER — SODIUM CHLORIDE 0.9 % IV SOLN: 1.0000 g | INTRAVENOUS | Status: DC

## 2011-03-03 MED ORDER — GABAPENTIN 100 MG PO CAPS
100.0000 mg | ORAL_CAPSULE | Freq: Three times a day (TID) | ORAL | Status: DC
  Administered 2011-03-03 – 2011-03-05 (×5): 100 mg via ORAL
  Filled 2011-03-03 (×8): qty 1

## 2011-03-03 MED ORDER — GABAPENTIN 100 MG PO CAPS
200.0000 mg | ORAL_CAPSULE | Freq: Every day | ORAL | Status: DC
  Filled 2011-03-03: qty 2

## 2011-03-03 NOTE — Progress Notes (Signed)
Patient ID: [name], female   DOB: [birth year], 60 y.o.   MRN: [id number] Subjective/Complaints: Both feet were numb now starting to hurt at night  Review of Systems  Musculoskeletal: Positive for back pain.  All other systems reviewed and are negative.     Objective: Vital Signs: Blood pressure 97/58, pulse 116, temperature 99.1 F (37.3 C), temperature source Oral, resp. rate 20, height 5\' 5"  (1.651 m), weight 49.3 kg (108 lb 11 oz), SpO2 99.00%. No results found.  Basename 03/02/11 1703 02/28/11 2236  WBC 19.2* 15.8*  HGB 9.8* 9.4*  HCT 30.9* 29.3*  PLT 418* 471*    Basename 03/02/11 1704 02/28/11 2236  NA 133* 135  K 4.2 3.9  CL 96 95*  CO2 23 26  GLUCOSE 116* 111*  BUN 21 22  CREATININE 5.75* 5.76*  CALCIUM 9.6 9.1   CBG (last 3)   Basename 03/02/11 1134 03/02/11 0733 03/01/11 2123  GLUCAP 87 91 111*    Wt Readings from Last 3 Encounters:  03/03/11 49.3 kg (108 lb 11 oz)  02/24/11 56.4 kg (124 lb 5.4 oz)  02/24/11 56.4 kg (124 lb 5.4 oz)    Physical Exam:  General appearance: alert, cooperative and no distress Head: Normocephalic, without obvious abnormality, atraumatic Eyes: conjunctivae/corneas clear. PERRL, EOM's intact. Fundi benign. Ears: normal TM's and external ear canals both ears Nose: Nares normal. Septum midline. Mucosa normal. No drainage or sinus tenderness. Throat: lips, mucosa, and tongue normal; teeth and gums normal Neck: no adenopathy, no carotid bruit, no JVD, supple, symmetrical, trachea midline and thyroid not enlarged, symmetric, no tenderness/mass/nodules Back: symmetric, no curvature. ROM normal. No CVA tenderness. Resp: clear to auscultation bilaterally Cardio: regular rate and rhythm, S1, S2 normal, no murmur, click, rub or gallop GI: soft, non-tender; bowel sounds normal; no masses,  no organomegaly Extremities: extremities normal, atraumatic, no cyanosis or edema slight edema distally only no foot lesions Pulses: 2+  and symmetric Skin: Skin color, texture, turgor normal. No rashes or lesions Neurologic: continued foot drop tr/5 but pt denies sensory changes. Plantarflexion 3-4/5.  Generalized weakness in proximal legs 3/5.  Heel cords loose Incision/Wound: wounds all clean and intact.  Ostomy site sealed and stool of normal consistency Abdomen soft non distended +BS  Assessment/Plan: 1. Functional deficits secondary to lumbar stenosis with radiculopathy s/p decompression and ischemic colitis requiring colectomy which require 3+ hours per day of interdisciplinary therapy in a comprehensive inpatient rehab setting. Physiatrist is providing close team supervision and 24 hour management of active medical problems listed below. Physiatrist and rehab team continue to assess barriers to discharge/monitor patient progress toward functional and medical goals  Mobility: Bed Mobility Bed Mobility: Yes Rolling Right: 4: Min assist;With rail Rolling Right Details (indicate cue type and reason): Cues to bend bil LEs and roll completely before pushign up to sit Left Sidelying to Sit: 3: Mod assist Left Sidelying to Sit Details (indicate cue type and reason): Increased effort and pain with coming up to sit'  Transfers Transfers: Yes Sit to Stand: 2: Max assist;With upper extremity assist;From chair/3-in-1 Stand to Sit: 2: Max assist;With upper extremity assist;To chair/3-in-1 Squat Pivot Transfers: 3: Mod assist Ambulation/Gait Ambulation/Gait Assistance: 2: Max assist Ambulation/Gait Assistance Details (indicate cue type and reason): A for LE lifting and placing due to bil LE foot drop, A for seaight shifting to unweight opposite LE, blocking knees Ambulation Distance (Feet): 8 Feet Assistive device: Rolling walker Gait Pattern: Decreased dorsiflexion - right;Decreased dorsiflexion - left;Right foot flat;Left  foot flat;Right genu recurvatum;Left genu recurvatum;Trunk flexed;Right hip hike Stairs: No (Unsafe t this  time) Architect: Yes Wheelchair Assistance: 4: Min Lexicographer: Both upper extremities Wheelchair Parts Management: Supervision/cueing (Cues for maintaining back precautions with parts management) Distance: 150 ADL:   Cognition: Cognition Overall Cognitive Status: Appears within functional limits for tasks assessed Arousal/Alertness: Awake/alert Orientation Level: Oriented X4 Memory: Appears intact Awareness: Appears intact Problem Solving: Appears intact Safety/Judgment: Appears intact Cognition Arousal/Alertness: Awake/alert Orientation Level: Oriented X4  1.Lumbar stenosis with radiculopathy-S/P laminectomy Lumbar 4-5 and L 5 S1 11/14.Back corset when out of bed.Routine back precautions. Consider b/l AFO's. Has AFO's in bed.  Will almost certainly need AFO's for gait.  2. DVT Prophylaxis/Anticoagulation: SCD,s/ and support hose.Monitor for any signs of DVT.   3. Pain Management: percocet prn.Monitor with increased activity. Will schedule a percocet with am and afternoon therapies. Doing very well with pain thi am.  4.Ischemic Colitis-S/P exp lap with right hemicolectomy and ileostomy 11/24.All antibiotics discontinued.Monitor wound site.Provide ileostomy care and education. She is doing some ostomy care already on her own. WBC's up today.  Afebrile.  Clinically improved. Recheck WBC's today.  5.ESRD-dialysis as directed per renal services.Weigh patient daily.Follow up labs with dialysis.  Pt was frustrated by late HD on Tuesday and it happened late again last night.   6Chronic anemia-aranesp with HD.Follow up CBC with HD and transfuse if symptomatic.   7.HTN-No present meds and monitor with increased activity.   8. Mood: ego support. She seems to be in great spirits and quite motivated. Will follow closely.  9. Bilateral foot drop due to Radiculopathy AFOs and PT, neuropathic pain increase Gabapentin to 200 Units      LOS  (Days) 6 A FACE TO FACE EVALUATION WAS PERFORMED  Ollie Esty E 03/03/2011, 7:12 AM

## 2011-03-03 NOTE — Progress Notes (Signed)
Patient ID: [name], female   DOB: May 31, 1950, 60 y.o.   MRN: [id number] Subjective/Complaints: Should I be having bloody BMs? No abd pain  Objective: Vital Signs: Blood pressure 97/58, pulse 116, temperature 99.1 F (37.3 C), temperature source Oral, resp. rate 20, height 5\' 5"  (1.651 m), weight 49.3 kg (108 lb 11 oz), SpO2 99.00%. No results found.   ABD: Incison looks good, no blood in ileostomy bag, Non tender, no distention   Assessment/Plan: 1. Functional deficits secondary to Lumbar stenosis and deconditioning post R colectomy from ischemic bowel which require 3+ hours per day of interdisciplinary therapy in a comprehensive inpatient rehab setting.  2. Anticoagulation/DVT prophylaxis with Pharmaceutical:  3. Blood per rectum, will ask Gen surgery to eval,  Also has increaing leukocytosis but no fever d/w nephro checking CBC in HD  6  [name] E 03/03/2011, 9:25 AM

## 2011-03-03 NOTE — Progress Notes (Signed)
Physical Therapy Note  Patient Details  Name: [name] MRN: [id number] Date of Birth: 1950-05-23 Today's Date: 03/03/2011  PT Cancellation Note  ___Treatment cancelled today due to medical issues with patient which prohibited therapy  ___ Treatment cancelled today due to patient receiving procedure or test   _x_ Treatment cancelled today due to patient's refusal to participate  States she is too fatigued from AM tx and dialysis last night. She is very concerned about her being so cold all of the time. She states she does not want a reattempt later today.   ___ Treatment cancelled today due to   Signature:     Windy Fast 03/03/2011, 1:15 PM

## 2011-03-03 NOTE — Progress Notes (Signed)
Patient alert and oriented x 3 with some STMD noted. Patient has called appropriately while sitting in w/c. Patient has some decreased sensation to right foot and right foot drop. Patient wears corset OOB. Patient able to stand pivot with min assist. Patient has RUA graft that is positive bruit and thrill. Patient does have some STMD and confusion at times. Patient has ileosotomy with ileostomy care done and teaching continued. Patient has neck IJ intact. IJ outdated- MD Kirsteins aware.  Patient back incision mostly healed- open to air. Patient has mid line incision with steris intact. Patient has allevyn dressing changed to sacrum over stage II. Patient receiving scheduled percocet for pain with lowest pain scored at a 6 this shift again. Patient had large amount of blood come out of rectum this morning. Dr Letta Pate notified. New orders received. Patient denied any pain in abdomen. Brown stool and gas continuing to collect regularly in colostomy. Temperature down this evening. 1st antibiotic dose given today. Continue with plan of care. Vital signs stable.

## 2011-03-03 NOTE — Progress Notes (Signed)
Occupational Therapy Session Note  Patient Details  Name: [name] MRN: [id number] Date of Birth: [birth year]  Today's Date: 03/03/2011 Time: 1500-1520 and 830-930 Time calculation:80 minutes   Precautions Precautions: Back Precaution Booklet Issued: No Precaution Comments: Pt. recalled 3/3 back precautions with min cues. Donned back brace with Mod verbal cues Required Braces or Orthoses: Yes Spinal Brace: Lumbar corset Restrictions Weight Bearing Restrictions: No  Short Term Goals: OT Short Term Goal 1: Patient will perform transfers at min assist level using adaptive equipment prn OT Short Term Goal 2: Patient will be able to verbalize and adhere to 3/3 back preacautions independently  OT Short Term Goal 3: Patient will donn back brace with set-up assist OT Short Term Goal 4: ADL short term goals = ADL long term goals  Skilled Therapeutic Interventions/Progress Updates:  am ADL in w/c and on toilet though patient c/o 8/10 pain.  RN had given meds and contacted MD regarding patient continuing pain and "spasm" and moderate amount of blood when patient stated she needed to have a BM (has colostomy and some of her intestines following surgery) PM after approx 20 minutes endurance activities, patient c/o fatigue and chill and stated she was not going to participate in any more therapy today     Pain 8/10 RN had already given but contacted MD for advisement  Therapy/Group: Individual Therapy  Alfredia Ferguson [facility] 03/03/2011, 5:02 PM

## 2011-03-03 NOTE — Progress Notes (Signed)
Physical Therapy Session Note  Patient Details  Name: [name] MRN: [id number] Date of Birth: Dec 27, 1950  Today's Date: 03/03/2011 Time: 1100-1200 Time Calculation (min): 60 min  Precautions: Precautions Precautions: Back Precaution Booklet Issued: No Precaution Comments: Pt. recalled 3/3 back precautions with min cues. Donned back brace with Mod verbal cues Required Braces or Orthoses: Yes Spinal Brace: Lumbar corset Restrictions Weight Bearing Restrictions: No  Short Term Goals: PT Short Term Goal 1: Pt min A functional transfers PT Short Term Goal 1 - Progress: Progressing toward goal PT Short Term Goal 2: Pt min A gait 50' in controlled environment PT Short Term Goal 2 - Progress: Progressing toward goal PT Short Term Goal 3: Pt propell WC 150' in community Miod I with parts management PT Short Term Goal 3 - Progress: Progressing toward goal  Skilled Therapeutic Interventions/Progress Updates:  No c/o pain.  Pt participated in LE strengthening group to increase participation and motivation. Pt performed the following there-ex 2x15 each bil with cues for technique and AAROM to strengthen throughout full motion  Supine ex:SAQ, heel slides, hip ABD/ADD,   Sitting: LAQ, hip ADD ball squeeze, hip ABD with orange TB resistance, HS curls with orange TB resistance  Standing in // bars: static stance with A for bil knee control 2 x30 sec and 2 sets of mini squats with A for control knee, rest breaks between sets.  See FIM for functional transfers.   Therapy/Group: Group Therapy  Windy Fast, PT 03/03/2011, 12:26 PM

## 2011-03-03 NOTE — Progress Notes (Signed)
Subjective: Interval History: none.  Objective: Vital signs in last 24 hours:  Temp:  [97.8 F (36.6 C)-99.7 F (37.6 C)] 99.1 F (37.3 C) (12/16 0618) Pulse Rate:  [80-156] 116  (12/16 0618) Resp:  [18-20] 20  (12/16 0618) BP: (97-174)/(58-95) 97/58 mmHg (12/16 0618) SpO2:  [95 %-99 %] 99 % (12/16 0618) Weight:  [49.3 kg (108 lb 11 oz)-54.4 kg (119 lb 14.9 oz)] 108 lb 11 oz (49.3 kg) (12/16 0618)  Weight change: 0.5 kg (1 lb 1.6 oz)  Intake/Output: I/O last 3 completed shifts: In: 35 [P.O.:960] Out: 2350 [Other:1800; Stool:550]   Intake/Output this shift:  Total I/O In: -  Out: 200 [Stool:200]  Heart:RRR, 2/6 sem LSB  Lungs:CTA  Abdomen:bs pos. Colostomy bag in place with stool . Surgical site healing with steri strips in place.  Extremities: Dialysis Access: Bruit rt upper arm  Trace edema   Lab Results:  [facility] 03/02/11 1703 02/28/11 2236  WBC 19.2* 15.8*  HGB 9.8* 9.4*  HCT 30.9* 29.3*  PLT 418* 471*   BMET  Basename 03/02/11 1704 02/28/11 2236  NA 133* 135  K 4.2 3.9  CL 96 95*  CO2 23 26  GLUCOSE 116* 111*  BUN 21 22  CREATININE 5.75* 5.76*  CALCIUM 9.6 9.1  PHOS 2.0* 2.5   LFT  Basename 03/02/11 1704  PROT --  ALBUMIN 1.9*  AST --  ALT --  ALKPHOS --  BILITOT --  BILIDIR --  IBILI --   PT/INR No results found for this basename: LABPROT:2,INR:2 in the last 72 hours Hepatitis Panel No results found for this basename: HEPBSAG,HCVAB,HEPAIGM,HEPBIGM in the last 72 hours  Studies/Results: No results found.  I have reviewed the patient's current medications.  Assessment/Plan: 1.Lumbar stenosis with radiculopathy-S/P laminectomy; Now Back on rehab.  2.Ischemic Colitis-S/P right hemicolectomy and ileostomy 11/24.  3.ESRD-TTS [place]; use albumin  4. Anemia of Renal Failure-Hgb 9.4 Max Aranesp; appears stable; T-Sat 20% 11/26 with high Ferritin (2404); no IV iron  5. Secondary hyperparathyroidism - no vitamin D;  6. HTN-controlled; .  Plan dialysis tts  7. Nutrition - severe protein/calorie malnutrition with albumin 1.5 02/20/11; appetite poor, has Nepro; add Megace .  Patient now getting rehab will continue dialysis TTS to keep on schedule WBC increase check in am      LOS: 6 [name] W @TODAY @8 :59 AM

## 2011-03-03 NOTE — Progress Notes (Signed)
Back to room from hemodialysis via hospital bed.  RUE AVG with positive bruit and thrill.  Dressing clean, dry, and intact.  Patient witout distress.

## 2011-03-03 NOTE — Progress Notes (Signed)
  Subjective: Pt with blood per rectum again.  She says this is a much smaller amount than before when she had large clots via rectum.    Objective: Vital signs in last 24 hours: Temp:  [97.8 F (36.6 C)-99.7 F (37.6 C)] 99.1 F (37.3 C) (12/16 0618) Pulse Rate:  [80-156] 116  (12/16 0618) Resp:  [18-20] 20  (12/16 0618) BP: (97-174)/(58-95) 97/58 mmHg (12/16 0618) SpO2:  [95 %-99 %] 99 % (12/16 0618) Weight:  [108 lb 11 oz (49.3 kg)-119 lb 14.9 oz (54.4 kg)] 108 lb 11 oz (49.3 kg) (12/16 0618) Last BM Date: 03/02/11  Intake/Output from previous day: 12/15 0701 - 12/16 0700 In: 960 [P.O.:960] Out: 2350 [Stool:550] Intake/Output this shift: Total I/O In: -  Out: 200 [Stool:200]  General appearance: alert and no distress GI: soft, non-tender; bowel sounds normal; no masses,  no organomegaly and ostomy pink with normal output.  Lab Results:   [facility] 03/02/11 1703 02/28/11 2236  WBC 19.2* 15.8*  HGB 9.8* 9.4*  HCT 30.9* 29.3*  PLT 418* 471*   BMET  Basename 03/02/11 1704 02/28/11 2236  NA 133* 135  K 4.2 3.9  CL 96 95*  CO2 23 26  GLUCOSE 116* 111*  BUN 21 22  CREATININE 5.75* 5.76*  CALCIUM 9.6 9.1   PT/INR No results found for this basename: LABPROT:2,INR:2 in the last 72 hours ABG No results found for this basename: PHART:2,PCO2:2,PO2:2,HCO3:2 in the last 72 hours  Studies/Results: No results found.  Anti-infectives: Anti-infectives    None      Assessment/Plan: s/p  Likely with further ischemia from colon. Would treat supportively with IVF, IV antibiotics, and possible endoscopy. Recommend consult GI as well.  LOS: 6 days    [facility] 03/03/2011

## 2011-03-04 LAB — CBC
HCT: 28.9 % — ABNORMAL LOW (ref 36.0–46.0)
Hemoglobin: 9.1 g/dL — ABNORMAL LOW (ref 12.0–15.0)
MCH: 28.2 pg (ref 26.0–34.0)
MCHC: 31.5 g/dL (ref 30.0–36.0)
RBC: 3.23 MIL/uL — ABNORMAL LOW (ref 3.87–5.11)

## 2011-03-04 MED ORDER — LIDOCAINE-PRILOCAINE 2.5-2.5 % EX CREA: 1.0000 "application " | TOPICAL_CREAM | CUTANEOUS | Status: DC | PRN

## 2011-03-04 MED ORDER — SODIUM CHLORIDE 0.9 % IV SOLN: 100.0000 mL | INTRAVENOUS | Status: DC | PRN

## 2011-03-04 MED ORDER — HEPARIN SODIUM (PORCINE) 1000 UNIT/ML DIALYSIS
1000.0000 [IU] | INTRAMUSCULAR | Status: DC | PRN
  Filled 2011-03-04: qty 1

## 2011-03-04 MED ORDER — ALTEPLASE 2 MG IJ SOLR: 2.0000 mg | Freq: Once | INTRAMUSCULAR | Status: AC | PRN

## 2011-03-04 MED ORDER — HEPARIN SODIUM (PORCINE) 1000 UNIT/ML DIALYSIS
20.0000 [IU]/kg | INTRAMUSCULAR | Status: DC | PRN
  Administered 2011-03-05 – 2011-03-12 (×3): 1000 [IU] via INTRAVENOUS_CENTRAL
  Filled 2011-03-04: qty 1

## 2011-03-04 MED ORDER — NEPRO/CARBSTEADY PO LIQD
237.0000 mL | ORAL | Status: DC | PRN
  Administered 2011-03-04: 237 mL via ORAL
  Filled 2011-03-04 (×3): qty 237

## 2011-03-04 MED ORDER — PENTAFLUOROPROP-TETRAFLUOROETH EX AERO: 1.0000 "application " | INHALATION_SPRAY | CUTANEOUS | Status: DC | PRN

## 2011-03-04 MED ORDER — LIDOCAINE HCL (PF) 1 % IJ SOLN: 5.0000 mL | INTRAMUSCULAR | Status: DC | PRN

## 2011-03-04 NOTE — Consult Note (Signed)
Referring Provider: Dr. Alger Simons Primary Care Physician:  Dr. Loni Muse. Florene Glen Primary Gastroenterologist:  Dr. Teena Irani  Reason for Consultation:  ? Smoldering ischemic center  HPI: [name] is a 60 y.o. female who underwent exploratory laparotomy on November 24 because of ischemic colitis, at which time she had an ileostomy and right hemicolectomy, with a Hartman's pouch to the distal transverse colon. That surgery was performed about 10 days after she had undergone a lumbar fusion operation. Subsequent colonoscopic evaluation performed on December 4 by Dr. Teena Irani, because of rectal bleeding, showed smoldering inflammatory change in the more proximal section of the colonic remnant, but biopsies were not obtained.  We were asked to see the patient at this time because, a couple of days ago, she passed a very small amount of blood per rectum, not nearly as much as she was previously having. She has been without abdominal pain. She has an elevated but stable white count of 19,000. She is able to eat okay. There has been no blood out her ileostomy.  There is no history of peripheral vascular disease as such, but she does have a history of diabetes and has end-stage renal disease. She is a former smoker, but has not smoked for the past 6 months.   Past Medical History  Diagnosis Date  . Seizures     r/t HTN in 1990's x 1  . Brain aneurysm   . Blood transfusion 1990's    r/t Kidney removal surgery  . Arthritis     Back  . Anemia   . Umbilical hernia age 24  . Heart murmur     Born with heart mumur, does not require follow up per pt  . Hypertension     Does not see a heart doctor, had pre transplant stress test at [facility]    . Diabetes mellitus     Borderline  . End stage renal disease on dialysis     T/Th/Sat dialysis on [employer]  . End stage renal disease on dialysis     01-29-2011  . Hepatitis C     Past Surgical History  Procedure Date  . Nephrectomy 2010   right side done at [facility] on transplant list  . Umbilical hernia repair age 3- 42  . Appendectomy 1960's  . Vascular surgery     right arm dialysis graft  . Tonsillectomy     as a child  . Rib resection     d/t kidney removal  . Back surgery 01/30/2011    laminectomy  . Hernia repair   . Laparotomy 02/09/2011    Procedure: EXPLORATORY LAPAROTOMY;  Surgeon: Harl Bowie, MD;  Location: [place];  Service: General;  Laterality: N/A;  . Partial colectomy 02/09/2011    Procedure: PARTIAL COLECTOMY;  Surgeon: Harl Bowie, MD;  Location: [place];  Service: General;  Laterality: Right;  . Colostomy 02/09/2011    Procedure: COLOSTOMY;  Surgeon: Harl Bowie, MD;  Location: [place];  Service: General;  Laterality: Right;  . Colonoscopy 02/19/2011    Procedure: COLONOSCOPY;  Surgeon: Missy Sabins, MD;  Location: [place];  Service: Endoscopy;  Laterality: N/A;    Prior to Admission medications   Medication Sig Start Date End Date Taking? Authorizing Provider  calcium carbonate (TUMS - DOSED IN MG ELEMENTAL CALCIUM) 500 MG chewable tablet Chew 1-2 tablets by mouth 3 (three) times daily.     Yes Historical Provider, MD  hydrOXYzine (ATARAX/VISTARIL) 25 MG tablet  Take 25 mg by mouth every 6 (six) hours as needed. For itching   Yes Historical Provider, MD    Current Facility-Administered Medications  Medication Dose Route Frequency Provider Last Rate Last Dose  . 0.9 %  sodium chloride infusion  100 mL Intravenous PRN Sherril Croon, MD      . 0.9 %  sodium chloride infusion  100 mL Intravenous PRN Sherril Croon, MD      . acetaminophen (TYLENOL) tablet 325-650 mg  325-650 mg Oral Q4H PRN Lavon Paganini. Angiulli, PA      . alteplase (CATHFLO ACTIVASE) injection 2 mg  2 mg Intracatheter Once PRN Sherril Croon, MD      . calcium carbonate (TUMS - dosed in mg elemental calcium) chewable tablet 400 mg of elemental calcium  400 mg of elemental calcium Oral TID WC Lavon Paganini. Angiulli, PA    400 mg of elemental calcium at 03/04/11 1245  . darbepoetin (ARANESP) injection 200 mcg  200 mcg Intravenous Q Tue-HD Lavon Paganini. [place], PA   200 mcg at 02/27/11 0459  . diphenhydrAMINE (BENADRYL) capsule 25 mg  25 mg Oral Q6H PRN Meredith Staggers, MD      . ertapenem [facility]) 0.5 g in sodium chloride 0.9 % 50 mL IVPB  500 mg Intravenous Q24H [place] R Smith, PHARMD   0.5 g at 03/04/11 1120  . feeding supplement (NEPRO CARB STEADY) liquid 237 mL  237 mL Oral TID WC Lavon Paganini. [place], PA   237 mL at 03/04/11 1245  . feeding supplement (NEPRO CARB STEADY) liquid 237 mL  237 mL Oral PRN Sherril Croon, MD   237 mL at 03/04/11 0857  . gabapentin (NEURONTIN) capsule 100 mg  100 mg Oral TID Meredith Staggers, MD   100 mg at 03/04/11 0857  . heparin injection 1,000 Units  20 Units/kg Dialysis PRN Sherril Croon, MD      . heparin injection 1,000 Units  1,000 Units Dialysis PRN Sherril Croon, MD      . lidocaine-prilocaine (EMLA) cream 1 application  1 application Topical PRN Sherril Croon, MD      . megestrol (MEGACE) 400 MG/10ML suspension 200 mg  200 mg Oral BID Foye Clock, PA   200 mg at 03/04/11 0859  . metoprolol (LOPRESSOR) injection 5 mg  5 mg Intravenous Q6H PRN Lavon Paganini. [place], PA      . multivitamin (RENA-VIT) tablet 1 tablet  1 tablet Oral Daily Lavon Paganini. [place], PA   1 tablet at 03/04/11 0857  . oxyCODONE-acetaminophen (PERCOCET) 5-325 MG per tablet 1 tablet  1 tablet Oral BID Meredith Staggers, MD   1 tablet at 03/04/11 1119  . oxyCODONE-acetaminophen (PERCOCET) 5-325 MG per tablet 2 tablet  2 tablet Oral Q4H PRN Lavon Paganini. [place], PA   2 tablet at 03/04/11 1320  . pantoprazole (PROTONIX) EC tablet 40 mg  40 mg Oral Q1200 Meredith Staggers, MD   40 mg at 03/04/11 1119  . pentafluoroprop-tetrafluoroeth (GEBAUERS) aerosol 1 application  1 application Topical PRN Sherril Croon, MD      . promethazine (PHENERGAN) tablet 12.5 mg  12.5 mg Oral Q6H PRN Lavon Paganini. Angiulli, PA       Or  .  promethazine (PHENERGAN) suppository 12.5 mg  12.5 mg Rectal Q6H PRN Lavon Paganini. Angiulli, PA       Or  . promethazine (PHENERGAN) injection 12.5 mg  12.5 mg Intramuscular Q6H PRN Quillian Quince  J. Angiulli, PA      . sodium chloride 0.9 % injection 10 mL  10 mL Intracatheter PRN Meredith Staggers, MD   10 mL at 03/04/11 1507  . sorbitol 70 % solution 30 mL  30 mL Oral Daily PRN Lavon Paganini. [place], PA      . DISCONTD: 0.9 %  sodium chloride infusion  100 mL Intravenous PRN Estanislado Emms, MD      . DISCONTD: 0.9 %  sodium chloride infusion  100 mL Intravenous PRN Sherril Croon, MD      . DISCONTD: 0.9 %  sodium chloride infusion  100 mL Intravenous PRN Sherril Croon, MD      . DISCONTD: 0.9 %  sodium chloride infusion  100 mL Intravenous PRN Sherril Croon, MD      . DISCONTD: 0.9 %  sodium chloride infusion  100 mL Intravenous PRN Sherril Croon, MD      . DISCONTD: 0.9 %  sodium chloride infusion  100 mL Intravenous PRN Stark Klein, MD      . DISCONTD: feeding supplement (NEPRO CARB STEADY) liquid 237 mL  237 mL Oral PRN Estanislado Emms, MD      . DISCONTD: feeding supplement (NEPRO CARB STEADY) liquid 237 mL  237 mL Oral PRN Sherril Croon, MD      . DISCONTD: feeding supplement (NEPRO CARB STEADY) liquid 237 mL  237 mL Oral PRN Sherril Croon, MD      . DISCONTD: heparin injection 1,100 Units  20 Units/kg Dialysis PRN Sherril Croon, MD      . DISCONTD: lidocaine (XYLOCAINE) 1 % injection 5 mL  5 mL Intradermal PRN Lavon Paganini. [place], PA      . DISCONTD: lidocaine (XYLOCAINE) 1 % injection 5 mL  5 mL Intradermal PRN Sherril Croon, MD      . DISCONTD: lidocaine (XYLOCAINE) 1 % injection 5 mL  5 mL Intradermal PRN Sherril Croon, MD      . DISCONTD: lidocaine (XYLOCAINE) 1 % injection 5 mL  5 mL Intradermal PRN Sherril Croon, MD      . DISCONTD: lidocaine-prilocaine (EMLA) cream 1 application  1 application Topical PRN Lavon Paganini. [place], PA      . DISCONTD: lidocaine-prilocaine (EMLA) cream 1 application  1  application Topical PRN Sherril Croon, MD      . DISCONTD: lidocaine-prilocaine (EMLA) cream 1 application  1 application Topical PRN Sherril Croon, MD      . DISCONTD: pentafluoroprop-tetrafluoroeth (GEBAUERS) aerosol 1 application  1 application Topical PRN Lavon Paganini. [place], PA      . DISCONTD: pentafluoroprop-tetrafluoroeth (GEBAUERS) aerosol 1 application  1 application Topical PRN Sherril Croon, MD      . DISCONTD: pentafluoroprop-tetrafluoroeth Landry Dyke) aerosol 1 application  1 application Topical PRN Sherril Croon, MD        Allergies as of 02/25/2011 - Review Complete 02/25/2011  Allergen Reaction Noted  . Contrast media (iodinated diagnostic agents)  01/25/2011  . Iohexol Other (See Comments) 12/21/2003    Family History  Problem Relation Age of Onset  . Anesthesia problems Neg Hx     History   Social History  . Marital Status: Legally Separated    Spouse Name: N/A    Number of Children: N/A  . Years of Education: N/A   Occupational History  . Not on file.   Social History Main Topics  . Smoking status: Former Smoker --  0.5 packs/day for 48 years    Types: Cigarettes    Quit date: 08/17/2010  . Smokeless tobacco: Never Used  . Alcohol Use: 1.2 oz/week    2 Shots of liquor per week  . Drug Use: No  . Sexually Active: No   Other Topics Concern  . Not on file   Social History Narrative  . No narrative on file    Review of Systems: Positive for: Heartburn symptoms since undergoing her recent operations, which are well controlled by PPI therapy she is receiving in-house. No prior history of heartburn.   No problem with anorexia, weight loss prior to her operations, dysphagia, abdominal pain, nausea, significant constipation (she does sometimes use a laxative on an occasional basis), diarrhea, or antecedent rectal bleeding. She indicates she had a colonoscopy that was apparently negative approximately 5 years ago. No problem with chest pain or trouble  breathing.   Physical Exam: Vital signs in last 24 hours: Temp:  [98.5 F (36.9 C)] 98.5 F (36.9 C) (12/17 0500) Pulse Rate:  [112] 112  (12/17 0500) Resp:  [17] 17  (12/17 0500) BP: (121)/(77) 121/77 mmHg (12/17 0500) SpO2:  [100 %] 100 % (12/17 0500) Weight:  [52.2 kg (115 lb 1.3 oz)] 115 lb 1.3 oz (52.2 kg) (12/17 0500) Last BM Date: 03/03/11 General:   Alert, pleasant and cooperative in NAD. She does appear a little bit thin and chronically ill, consistent with her end stage renal disease. Head:  Normocephalic and atraumatic. Eyes:  Sclera clear, no icterus.   Conjunctiva pink. Lungs:  Clear throughout to auscultation.   No wheezes, crackles, or rhonchi. No evident respiratory distress. Heart:   Regular rate and rhythm; rate somewhat rapid, around 110, soft 3/6 blowing murmur at left upper sternal border, no clicks, rubs,  or gallops. Abdomen:  Soft, nontender,  and nondistended. No masses, hepatosplenomegaly or ventral hernias noted. Quiet bowel sounds, without bruits, guarding, or rebound.  Ileostomy in the right lower quadrant draining brown fluid. Midline incision well healed.   Intake/Output from previous day: 12/16 0701 - 12/17 0700 In: 240 [P.O.:240] Out: 550 [Stool:550] Intake/Output this shift: Total I/O In: 290 [P.O.:240; IV Piggyback:50] Out: 100 [Stool:100]  Lab Results:  Basename 03/04/11 0600 03/02/11 1703  WBC 19.2* 19.2*  HGB 9.1* 9.8*  HCT 28.9* 30.9*  PLT 338 418*   BMET  Basename 03/02/11 1704  NA 133*  K 4.2  CL 96  CO2 23  GLUCOSE 116*  BUN 21  CREATININE 5.75*  CALCIUM 9.6   LFT  Basename 03/02/11 1704  PROT --  ALBUMIN 1.9*  AST --  ALT --  ALKPHOS --  BILITOT --  BILIDIR --  IBILI --   PT/INR No results found for this basename: LABPROT:2,INR:2 in the last 72 hours   Impression:  Resolving ischemic colitis occurring postoperatively. No current evidence of active ischemia on clinical grounds. Mild leukocytosis of unclear  cause  Plan: Outpatient followup with Dr. Teena Irani. If the patient has significant abdominal pain or further bleeding prior to discharge, we could consider doing an updated sigmoidoscopic evaluation   LOS: 7 days   Valgene Deloatch V  03/04/2011, 5:01 PM

## 2011-03-04 NOTE — Progress Notes (Signed)
Pt alert and oriented X 3 With STMD. Able to make needs known. Pt able to stand and pivot with min assist. Pt c /o (R) LLE of decrease sensation with 2+ edema. Pt has RUA graft + for bruit and thrill. IJ to right side of neck intact with clean and dry dressing. Ileostomy intact draining brown loose bowel. On going pt teaching to ileostomy. Pain is chronic 8/10 of lower extremity. Pt has schedule Percocet and Neurontin. Pt has midline incision with steri strips. Dressing clean and dry to sacrum area.

## 2011-03-04 NOTE — Progress Notes (Signed)
Patient ID: [name], female   DOB: 1951/01/31, 60 y.o.   MRN: [id number] Patient ID: [name], female   DOB: [birth year], 60 y.o.   MRN: [id number] Subjective/Complaints: Review of Systems  Musculoskeletal: Positive for back pain.  All other systems reviewed and are negative.  some blood in stool over weekend.  No increase in abdominal pain.   Objective: Vital Signs: Blood pressure 121/77, pulse 112, temperature 98.5 F (36.9 C), temperature source Oral, resp. rate 17, height 5\' 5"  (1.651 m), weight 52.2 kg (115 lb 1.3 oz), SpO2 100.00%. No results found.  Basename 03/04/11 0600 03/02/11 1703  WBC 19.2* 19.2*  HGB 9.1* 9.8*  HCT 28.9* 30.9*  PLT 338 418*    Basename 03/02/11 1704  NA 133*  K 4.2  CL 96  CO2 23  GLUCOSE 116*  BUN 21  CREATININE 5.75*  CALCIUM 9.6   CBG (last 3)   Basename 03/03/11 0734 03/02/11 1134 03/02/11 0733  GLUCAP 106* 87 91    Wt Readings from Last 3 Encounters:  03/04/11 52.2 kg (115 lb 1.3 oz)  02/24/11 56.4 kg (124 lb 5.4 oz)  02/24/11 56.4 kg (124 lb 5.4 oz)    Physical Exam:  General appearance: alert, cooperative and no distress Head: Normocephalic, without obvious abnormality, atraumatic Eyes: conjunctivae/corneas clear. PERRL, EOM's intact. Fundi benign. Ears: normal TM's and external ear canals both ears Nose: Nares normal. Septum midline. Mucosa normal. No drainage or sinus tenderness. Throat: lips, mucosa, and tongue normal; teeth and gums normal Neck: no adenopathy, no carotid bruit, no JVD, supple, symmetrical, trachea midline and thyroid not enlarged, symmetric, no tenderness/mass/nodules Back: symmetric, no curvature. ROM normal. No CVA tenderness. Resp: clear to auscultation bilaterally Cardio: regular rate and rhythm, S1, S2 normal, no murmur, click, rub or gallop GI: soft, non-tender; bowel sounds normal; no masses,  no organomegaly Extremities: extremities normal, atraumatic, no cyanosis or edema slight  edema distally only Pulses: 2+ and symmetric Skin: Skin color, texture, turgor normal. No rashes or lesions Neurologic: continued foot drop tr/5 but pt denies sensory changes. Plantarflexion 3-4/5.  Generalized weakness in proximal legs 3/5.  Heel cords loose Incision/Wound: wounds all clean and intact.  Ostomy site sealed and stool of normal consistency   Assessment/Plan: 1. Functional deficits secondary to lumbar stenosis with radiculopathy s/p decompression and ischemic colitis requiring colectomy which require 3+ hours per day of interdisciplinary therapy in a comprehensive inpatient rehab setting. Physiatrist is providing close team supervision and 24 hour management of active medical problems listed below. Physiatrist and rehab team continue to assess barriers to discharge/monitor patient progress toward functional and medical goals.  ?E-Stim to lower exts  Mobility: Bed Mobility Bed Mobility: Yes Rolling Right: 4: Min assist;With rail Rolling Right Details (indicate cue type and reason): Cues to bend bil LEs and roll completely before pushign up to sit Left Sidelying to Sit: 3: Mod assist Left Sidelying to Sit Details (indicate cue type and reason): Increased effort and pain with coming up to sit'  Transfers Transfers: Yes Sit to Stand: 2: Max assist;With upper extremity assist;From chair/3-in-1 Stand to Sit: 2: Max assist;With upper extremity assist;To chair/3-in-1 Squat Pivot Transfers: 3: Mod assist Ambulation/Gait Ambulation/Gait Assistance: 2: Max assist Ambulation/Gait Assistance Details (indicate cue type and reason): A for LE lifting and placing due to bil LE foot drop, A for seaight shifting to unweight opposite LE, blocking knees Ambulation Distance (Feet): 8 Feet Assistive device: Rolling walker Gait Pattern: Decreased dorsiflexion - right;Decreased dorsiflexion -  left;Right foot flat;Left foot flat;Right genu recurvatum;Left genu recurvatum;Trunk flexed;Right hip  hike Stairs: No (Unsafe t this time) Architect: Yes Wheelchair Assistance: 4: Min Lexicographer: Both upper extremities Wheelchair Parts Management: Supervision/cueing (Cues for maintaining back precautions with parts management) Distance: 150 ADL:   Cognition: Cognition Overall Cognitive Status: Appears within functional limits for tasks assessed Arousal/Alertness: Awake/alert Orientation Level: Oriented X4 Memory: Appears intact Awareness: Appears intact Problem Solving: Appears intact Safety/Judgment: Appears intact Cognition Arousal/Alertness: Awake/alert Orientation Level: Oriented X4  1.Lumbar stenosis with radiculopathy-S/P laminectomy Lumbar 4-5 and L 5 S1 11/14.Back corset when out of bed.Routine back precautions. Consider b/l AFO's. Has AFO's in bed.  Will almost certainly need AFO's for gait.  2. DVT Prophylaxis/Anticoagulation: SCD,s/ and support hose.Monitor for any signs of DVT.   3. Pain Management: percocet prn.Monitor with increased activity. Will schedule a percocet with am and afternoon therapies. Doing very well with pain thi am.  4.Ischemic Colitis-S/P exp lap with right hemicolectomy and ileostomy 11/24.Provide ileostomy care and education. She is doing some ostomy care already on her own. WBC's up to 19.2 now.  Likely further ischemia.    -appreciate surgical f/u  -will ask GI for recs  -invanz   5.ESRD-dialysis as directed per renal services.Weigh patient daily.Follow up labs with dialysis.  Pt was frustrated by late HD on Tuesday and it happened late again last night.   6Chronic anemia-aranesp with HD.Follow up CBC with HD and transfuse if symptomatic.   7.HTN-No present meds and monitor with increased activity.   8. Mood: ego support. She seems to be in great spirits and quite motivated. Will follow closely.  9. Bilateral foot drop due to Radiculopathy AFOs and PT.  Too swollen for walking AFO's.  TEDS,  edema control.      LOS (Days) 7 A FACE TO FACE EVALUATION WAS PERFORMED  SWARTZ,ZACHARY T 03/04/2011, 7:38 AM

## 2011-03-04 NOTE — Progress Notes (Signed)
Physical Therapy Session Note  Patient Details  Name: [name] MRN: [id number] Date of Birth: 05-23-1950  Today's Date: 03/04/2011 1st Time:  10:18-11:07 Time Calculation:  49 min 2nd Time: [id number] Time Calculation (min): 45 min  Precautions: Precautions Precautions: Back Precaution Booklet Issued: No Required Braces or Orthoses: Yes Spinal Brace: Lumbar corset  Short Term Goals: PT Short Term Goal 1: Pt min A functional transfers PT Short Term Goal 1 - Progress: Progressing toward goal PT Short Term Goal 2: Pt min A gait 50' in controlled environment PT Short Term Goal 2 - Progress: Progressing toward goal PT Short Term Goal 3: Pt propell WC 150' in community Miod I with parts management PT Short Term Goal 3 - Progress: Progressing toward goal  Skilled Therapeutic Interventions/Progress Updates:   1st Treatment: Gait training with and without Allard AFOs.  Pt needed min@ without  AFOs in socks, with noted bilateral ankle foot drop, increased hip flexion to clear feet.  Increased trunk flexion, with weight on UEs through the RW.  Decreased speed and step length.  Pt had more difficulty with AFOs due to wearing shoes and shoes, especially the right catching on the floor.  Distance approx. 20'.  W/c propulsion x 75' on the unit with supervision.  At end of session, pt stating she was finished with therapy for the day.  2nd Treatment: Pt easily convinced to participate, though initially not wanting to.  Transfer training from bed and on and off NuStep with min@.  NuStep x 10 min, on workload =1.  Car transfer with mod@, squat-pivot.  Pt then ready to end treatment 15 min early due to fatigue.  Pain No pain reported either session.  Therapy/Group: Individual Therapy  Waylan Boga 03/04/2011, 3:51 PM

## 2011-03-04 NOTE — Progress Notes (Signed)
Subjective: Interval History: none.  Objective: Vital signs in last 24 hours:  Temp:  [98.2 F (36.8 C)-98.5 F (36.9 C)] 98.5 F (36.9 C) (12/17 0500) Pulse Rate:  [111-112] 112  (12/17 0500) Resp:  [17-18] 17  (12/17 0500) BP: (100-121)/(67-77) 121/77 mmHg (12/17 0500) SpO2:  [96 %-100 %] 100 % (12/17 0500) Weight:  [52.2 kg (115 lb 1.3 oz)] 115 lb 1.3 oz (52.2 kg) (12/17 0500)  Weight change: -2.2 kg (-4 lb 13.6 oz)  Intake/Output: I/O last 3 completed shifts: In: 480 [P.O.:480] Out: 2600 [Other:1800; Stool:800]   Intake/Output this shift:     Heart:RRR, 2/6 sem LSB  Lungs:CTA  Abdomen:bs pos. Colostomy bag in place with stool . Surgical site healing with steri strips in place.  Extremities: Dialysis Access: Bruit rt upper arm  Trace edema   Lab Results:  Basename 03/04/11 0600 03/02/11 1703  WBC 19.2* 19.2*  HGB 9.1* 9.8*  HCT 28.9* 30.9*  PLT 338 418*   BMET  Basename 03/02/11 1704  NA 133*  K 4.2  CL 96  CO2 23  GLUCOSE 116*  BUN 21  CREATININE 5.75*  CALCIUM 9.6  PHOS 2.0*   LFT  Basename 03/02/11 1704  PROT --  ALBUMIN 1.9*  AST --  ALT --  ALKPHOS --  BILITOT --  BILIDIR --  IBILI --   PT/INR No results found for this basename: LABPROT:2,INR:2 in the last 72 hours Hepatitis Panel No results found for this basename: HEPBSAG,HCVAB,HEPAIGM,HEPBIGM in the last 72 hours  Studies/Results: No results found.  I have reviewed the patient's current medications.  Assessment/Plan: 1.Lumbar stenosis with radiculopathy-S/P laminectomy; Now Back on rehab.  2.Ischemic Colitis-S/P right hemicolectomy and ileostomy 11/24.  3.ESRD-TTS [place]; use albumin  4. Anemia of Renal Failure-Hgb 9.1 Max Aranesp; appears stable although a little lower   5. Secondary hyperparathyroidism - no vitamin D;  6. HTN-controlled; . Plan dialysis tts  7. Nutrition - severe protein/calorie malnutrition with albumin 1.5 02/20/11; appetite poor, has Nepro; add Megace .    Patient now getting rehab will continue dialysis TTS to keep on schedule   Wbc is still elevated, ongoing ischemia thought responsible. Ivanz 0.5g every 24 hours.       LOS: 7 Skai Lickteig W @TODAY @7 :56 AM

## 2011-03-04 NOTE — Progress Notes (Signed)
Occupational Therapy Session Note  Patient Details  Name: [name] MRN: [id number] Date of Birth: Jun 14, 1950  Today's Date: 03/04/2011 Time: 0930-1015 Time Calculation (min): 45 min  Precautions: Precautions Precautions: Back Precaution Booklet Issued: No Precaution Comments: Pt. recalled 3/3 back precautions with min cues. Donned back brace with Mod verbal cues Required Braces or Orthoses: Yes Spinal Brace: Lumbar corset Restrictions Weight Bearing Restrictions: No  Short Term Goals: OT Short Term Goal 1: Patient will perform transfers at min assist level using adaptive equipment prn OT Short Term Goal 2: Patient will be able to verbalize and adhere to 3/3 back preacautions independently  OT Short Term Goal 3: Patient will donn back brace with set-up assist OT Short Term Goal 4: ADL short term goals = ADL long term goals  Skilled Therapeutic Interventions/Progress Updates:    Found patient supine in bed. Engaged in bed mobility (supine to sit) for min assist edge of bed to w/c transfer and engaged in ADL retraining at sink level in sit to stand position with brace donned during standing tasks. Focused skilled intervention on increasing overall activity tolerance/endurance while incorporating energy conservation techniques of rest breaks prn, facilitation of bilateral lower extremities for LB ADLs, sit to stands and stand to sits (at sink). Patient able to verbalize 2/3 back precautions, but adhering to 3/3.   Pain Patient complained of 8/10 pain. RN aware.  Therapy/Group: Individual Therapy  [name] 03/04/2011, 10:36 AM

## 2011-03-04 NOTE — Progress Notes (Signed)
Occupational Therapy Session Note  Patient Details  Name: [name] MRN: [id number] Date of Birth: [birth year]  Today's Date: 03/04/2011 Time: 1300-1310 Time Calculation (min): 10 min Missed 35 Minutes of skilled OT secondary to refusal because of pain.  Precautions: Precautions Precautions: Back Precaution Booklet Issued: No Required Braces or Orthoses: Yes Spinal Brace: Lumbar corset Restrictions Weight Bearing Restrictions: No  Short Term Goals: OT Short Term Goal 1: Patient will perform transfers at min assist level using adaptive equipment prn OT Short Term Goal 2: Patient will be able to verbalize and adhere to 3/3 back preacautions independently  OT Short Term Goal 3: Patient will donn back brace with set-up assist OT Short Term Goal 4: ADL short term goals = ADL long term goals  Skilled Therapeutic Interventions/Progress Updates:    Upon entering room patient stated "Im hurting" and stated her pain was 8/10. Noticed patient hadn't eaten her lunch, encouraged patient to sit edge of bed to eat. Patient seemed confused, stating "Ive already eaten". Showed patient her lunch tray and she realized she hadn't. Minimal assist for bed mobility from supine to edge of bed. Patient refused any therapy secondary to pain.   Pain Patient complained of 8/10 pain; repositioned patient and notified RN.   Therapy/Group: Individual Therapy  Decorey Wahlert 03/04/2011, 1:33 PM

## 2011-03-05 ENCOUNTER — Inpatient Hospital Stay (HOSPITAL_COMMUNITY): Payer: Medicare Other

## 2011-03-05 ENCOUNTER — Other Ambulatory Visit: Payer: Self-pay

## 2011-03-05 DIAGNOSIS — Z5189 Encounter for other specified aftercare: Secondary | ICD-10-CM

## 2011-03-05 DIAGNOSIS — M47817 Spondylosis without myelopathy or radiculopathy, lumbosacral region: Secondary | ICD-10-CM

## 2011-03-05 DIAGNOSIS — IMO0002 Reserved for concepts with insufficient information to code with codable children: Secondary | ICD-10-CM

## 2011-03-05 DIAGNOSIS — N186 End stage renal disease: Secondary | ICD-10-CM

## 2011-03-05 LAB — RENAL FUNCTION PANEL
BUN: 41 mg/dL — ABNORMAL HIGH (ref 6–23)
CO2: 27 mEq/L (ref 19–32)
Chloride: 94 mEq/L — ABNORMAL LOW (ref 96–112)
Glucose, Bld: 102 mg/dL — ABNORMAL HIGH (ref 70–99)
Potassium: 3.6 mEq/L (ref 3.5–5.1)

## 2011-03-05 LAB — CBC
HCT: 27.8 % — ABNORMAL LOW (ref 36.0–46.0)
Hemoglobin: 8.7 g/dL — ABNORMAL LOW (ref 12.0–15.0)
MCV: 88.3 fL (ref 78.0–100.0)
WBC: 19.5 10*3/uL — ABNORMAL HIGH (ref 4.0–10.5)

## 2011-03-05 MED ORDER — DARBEPOETIN ALFA-POLYSORBATE 200 MCG/0.4ML IJ SOLN
200.0000 ug | INTRAMUSCULAR | Status: DC
  Filled 2011-03-05: qty 0.4

## 2011-03-05 MED ORDER — OXYCODONE-ACETAMINOPHEN 5-325 MG PO TABS
ORAL_TABLET | ORAL | Status: AC
  Filled 2011-03-05: qty 2

## 2011-03-05 NOTE — Patient Care Conference (Signed)
Inpatient RehabilitationTeam Conference Note Date: 03/05/2011   Time: 6:57 PM    Patient Name: [name]      Medical Record Number: [id number]  Date of Birth: 10/30/1950 Sex: Female         Room/Bed: 4006/4006-01 Payor Info: Payor: MEDICARE  Plan: MEDICARE PART A AND B  Product Type: *No Product type*     Admitting Diagnosis: LUMBAR FUSION,HEMICOLECTOMY, ESRD  Admit Date/Time:  02/25/2011  3:33 PM Admission Comments: No comment available   Primary Diagnosis:  Radiculopathy of lumbar region Principal Problem: Radiculopathy of lumbar region  Patient Active Problem List  Diagnoses Date Noted  . Radiculopathy of lumbar region 02/26/2011  . Anemia of chronic kidney failure 02/09/2011  . Acute ischemic colitis 02/09/2011  . Radiculopathy 02/01/2011  . Lumbar stenosis with neurogenic claudication 01/30/2011  . Spondylolisthesis of lumbar region 01/30/2011  . End stage renal disease on dialysis 01/30/2011  . Hypertension 01/30/2011  . Unspecified viral hepatitis C without hepatic coma 01/30/2011    Expected Discharge Date: Expected Discharge Date: 03/08/11  Team Members Present: Physician: Dr. Alger Simons Case Manager Present: Lutricia Feil, RN Social Worker Present: Lennart Pall, LCSW PT Present: Canary Brim, Toma Copier, PTA OT Present: Chrys Racer, OT Other (Discipline and Name): Daiva Nakayama, PPS Coordinator RN Present: Janyth Pupa    Current Status/Progress Goal Weekly Team Focus  Medical   smoldering white blood cell count.  gi and surgery folowing. persistent foot drop but has edema  edema control. close monitoring of abdominal situation only for now  increased activity tolerance.  timing of afo's, edema control   Bowel/Bladder   HD-Anuria and Ileostomy  Mod I with ostomy care  Pt to return demonstrate care of ileostomy   Swallow/Nutrition/ Hydration             ADL's   Overall steady->min assist with LB ADLs, Supervision UB ADLs  Overall Supervision   ADL retraining, increasing activity tolerance, sit to stands, dynamic sitting, donning/doffing back brace   Mobility   minA overall, min/modA with gait  S bed mobility and transfers, minA gait  transfers, d/c planning, gait   Communication             Safety/Cognition/ Behavioral Observations            Pain   Scheduled percocet at 0700 and 1200  Pain managed with scheduled pain medication  Assess pain medication on cognition   Skin   RIJ with dressing CDI, graft in RUE, abdominal incision wtih steristrips, lumbar incision OTA, ileostomy to RUQ, stage 2 to skin abrasion with allevyn dressing intact  No additional skin breakdown  Continue with education of skin care and incision management      *See Interdisciplinary Assessment and Plan and progress notes for long and short-term goals  Barriers to Discharge: lower extremity weakness, potentially medical issues    Possible Resolutions to Barriers:  medical mgt as above..  possible afo's before discharge    Discharge Planning/Teaching Needs:  home with daughter, Mateo Flow and friend to provide 24/7 assistance      Team Discussion: Pt noted w/ confusion & lethargy.  MD to adjust pain meds in case that is the cause.  She continues on IV antibx.  Will wait on AFOs till feet swelling decreases.     Revisions to Treatment Plan: Monitoring medically- may need to postpone d/c if does not improve.    Continued Need for Acute Rehabilitation Level of Care: The patient requires daily medical  management by a physician with specialized training in physical medicine and rehabilitation for the following conditions: Daily direction of a multidisciplinary physical rehabilitation program to ensure safe treatment while eliciting the highest outcome that is of practical value to the patient.: Yes Daily medical management of patient stability for increased activity during participation in an intensive rehabilitation regime.: Yes Daily analysis of  laboratory values and/or radiology reports with any subsequent need for medication adjustment of medical intervention for : Post surgical problems;Neurological problems  Pt given conference info, but, she was very sleepy.  Daughter, Mateo Flow, is aware d/c date is still 12/21, but, w/ possibility it will be postponed.  Daughter said pt slept all afternoon when she & her father visited.  Once pt able, will need daughter in for family ed.  Daughter is working on getting a ramp up, but, it will probably not be in place by this weekend.  Theora Master 03/05/2011, 6:57 PM

## 2011-03-05 NOTE — Progress Notes (Signed)
Therapeutic Recreation Discharge Summary Patient Details  Name: [name] MRN: [id number] Date of Birth: [birth year]  Long term goals set: 1  Long term goals met: 1  Comments on progress toward goals: Pt met supervision level for seated TR tasks.  Pt currently requires set-up assist for task completion given extra time.  No further TR treatment per pt request.  Pt states fatigue from other therapies in addition to undergoing dialysis.   Reasons for discharge: treatment goals met   Patient/family agrees with progress made and goals achieved: Yes  Rudean Icenhour 03/05/2011, 4:14 PM

## 2011-03-05 NOTE — Progress Notes (Signed)
Patient Details  Name: [name] MRN: [id number] Date of Birth: 27-Feb-1951  Today's Date: 03/05/2011 Time: 11:30-11:40 Skilled Therapeutic Interventions/Progress Updates: Met with pt to discuss TR sessions.  Pt has supplies in the room to complete with set-up assist.  Pt states no desire to "actively" participate in TR services- pt currently supervision level given need for set up assist.  Pt c/o pain- RN aware and medicating pt.     Individual Therapy  Activity Level: Simple:  Level of assist: Supervision  Yarenis Cerino 03/05/2011, 12:59 PM

## 2011-03-05 NOTE — Progress Notes (Signed)
Physical Therapy Note  Patient Details  Name: [name] MRN: [id number] Date of Birth: 08/31/1950 Today's Date: 03/05/2011  Individual therapy 1035-1115 (40 min) C/o pain in legs, notified RN. Pt appears to be confused this morning, after GI doctor came into the room pt asked "what happened to my pills?" Difficulty focusing on task and requiring max encouragement to participate in therex and functional standing tasks. Stretching to bilateral lower extremities hamstrings and heel cords, pt reports relief in legs with stretching and said she would have her daughter do this for her at home. Sit to stands with modA for standing tolerance and marching in place, pt declined to work on gait this AM. Discussed waiting for AFO's until swelling decreased and this might need to be followed up with after discharge with follow up therapy, pt agreed. Seated therex for lower extremity strengthening, assisted motion.   [name] 03/05/2011, 12:19 PM

## 2011-03-05 NOTE — Progress Notes (Signed)
Subjective: Interval History: none.  Objective: Vital signs in last 24 hours:  Temp:  [99.3 F (37.4 C)] 99.3 F (37.4 C) (12/18 0531) Pulse Rate:  [118] 118  (12/18 0531) Resp:  [20] 20  (12/18 0531) BP: (109)/(71) 109/71 mmHg (12/18 0531) SpO2:  [95 %] 95 % (12/18 0531) Weight:  [53.2 kg (117 lb 4.6 oz)] 117 lb 4.6 oz (53.2 kg) (12/18 0531)  Weight change: 1 kg (2 lb 3.3 oz)  Intake/Output: I/O last 3 completed shifts: In: 64 [P.O.:360; IV Piggyback:50] Out: 500 [Stool:500]   Intake/Output this shift:  Total I/O In: 240 [P.O.:240] Out: -   Heart:RRR, 2/6 sem LSB  Lungs:CTA  Abdomen:bs pos. Colostomy bag in place with stool . Surgical site healing with steri strips in place.  Extremities: Dialysis Access: Bruit rt upper arm  Trace edema   Lab Results:  Basename 03/04/11 0600 03/02/11 1703  WBC 19.2* 19.2*  HGB 9.1* 9.8*  HCT 28.9* 30.9*  PLT 338 418*   BMET  Basename 03/02/11 1704  NA 133*  K 4.2  CL 96  CO2 23  GLUCOSE 116*  BUN 21  CREATININE 5.75*  CALCIUM 9.6  PHOS 2.0*   LFT  Basename 03/02/11 1704  PROT --  ALBUMIN 1.9*  AST --  ALT --  ALKPHOS --  BILITOT --  BILIDIR --  IBILI --   PT/INR No results found for this basename: LABPROT:2,INR:2 in the last 72 hours Hepatitis Panel No results found for this basename: HEPBSAG,HCVAB,HEPAIGM,HEPBIGM in the last 72 hours  Studies/Results: No results found.  I have reviewed the patient's current medications.  Assessment/Plan: 1.Lumbar stenosis with radiculopathy-S/P laminectomy; Now Back on rehab.  2.Ischemic Colitis-S/P right hemicolectomy and ileostomy 11/24.  3.ESRD-TTS [place]; use albumin  4. Anemia of Renal Failure-Hgb 9.1 Max Aranesp; appears stable although a little lower  5. Secondary hyperparathyroidism - no vitamin D;  6. HTN-controlled; . Plan dialysis tts  7. Nutrition - severe protein/calorie malnutrition with albumin 1.5 02/20/11; appetite poor, has Nepro; add Megace .    Patient now getting rehab will continue dialysis TTS to keep on schedule No current evidence of active ischemia on clinical grounds. Mild leukocytosis of unclear cause per Dr Cristina Gong        LOS: 8 [name] W @TODAY @8 :16 AM

## 2011-03-05 NOTE — Progress Notes (Signed)
Discussed case with surgery physician assistant.  We are in agreement that there is no necessity to do an updated colonoscopy, in view of the fact that the patient has not had any further bleeding for the past several days.   She does have occasional mild abdominal pain, which is not related to meals, and does not seem to be that bothersome for her.   We will await an updated CBC result and tentatively we will anticipate outpatient followup with Dr. Teena Irani, who can discuss whether an elective reexamination of the colonic remnant is needed.   Cleotis Nipper, M.D. [phone]

## 2011-03-05 NOTE — Progress Notes (Signed)
Patient ID: EZRIE SINDELAR, female   DOB: 23-Jun-1950, 60 y.o.   MRN: [id number] Patient ID: KINNA GREENIA, female   DOB: [birth year], 60 y.o.   MRN: [id number] Patient ID: ARICELI STOBAUGH, female   DOB: [birth year], 60 y.o.   MRN: [id number] Subjective/Complaints: Review of Systems  Musculoskeletal: Positive for back pain.  All other systems reviewed and are negative.  slept well.  No increase in abdominal pain.   Objective: Vital Signs: Blood pressure 109/71, pulse 118, temperature 99.3 F (37.4 C), temperature source Oral, resp. rate 20, height 5\' 5"  (1.651 m), weight 53.2 kg (117 lb 4.6 oz), SpO2 95.00%. No results found.  Basename 03/04/11 0600 03/02/11 1703  WBC 19.2* 19.2*  HGB 9.1* 9.8*  HCT 28.9* 30.9*  PLT 338 418*    Basename 03/02/11 1704  NA 133*  K 4.2  CL 96  CO2 23  GLUCOSE 116*  BUN 21  CREATININE 5.75*  CALCIUM 9.6   CBG (last 3)   Basename 03/03/11 0734 03/02/11 1134 03/02/11 0733  GLUCAP 106* 87 91    Wt Readings from Last 3 Encounters:  03/05/11 53.2 kg (117 lb 4.6 oz)  02/24/11 56.4 kg (124 lb 5.4 oz)  02/24/11 56.4 kg (124 lb 5.4 oz)    Physical Exam:  General appearance: alert, cooperative and no distress Head: Normocephalic, without obvious abnormality, atraumatic Eyes: conjunctivae/corneas clear. PERRL, EOM's intact. Fundi benign. Ears: normal TM's and external ear canals both ears Nose: Nares normal. Septum midline. Mucosa normal. No drainage or sinus tenderness. Throat: lips, mucosa, and tongue normal; teeth and gums normal Neck: no adenopathy, no carotid bruit, no JVD, supple, symmetrical, trachea midline and thyroid not enlarged, symmetric, no tenderness/mass/nodules Back: symmetric, no curvature. ROM normal. No CVA tenderness. Resp: clear to auscultation bilaterally Cardio: regular rate and rhythm, S1, S2 normal, no murmur, click, rub or gallop GI: soft, non-tender; bowel sounds normal; no masses,  no organomegaly Extremities:  extremities normal, atraumatic, no cyanosis or edema slight edema distally only Pulses: 2+ and symmetric Skin: Skin color, texture, turgor normal. No rashes or lesions Neurologic: continued foot drop tr/5 but pt denies sensory changes. Plantarflexion 3-4/5.  Generalized weakness in proximal legs 3/5.  Heel cords loose Incision/Wound: wounds all clean and intact.  Ostomy site sealed and stool of normal consistency   Assessment/Plan: 1. Functional deficits secondary to lumbar stenosis with radiculopathy s/p decompression and ischemic colitis requiring colectomy which require 3+ hours per day of interdisciplinary therapy in a comprehensive inpatient rehab setting. Physiatrist is providing close team supervision and 24 hour management of active medical problems listed below. Physiatrist and rehab team continue to assess barriers to discharge/monitor patient progress toward functional and medical goals.  ?E-Stim to lower exts  Mobility: Bed Mobility Bed Mobility: Yes Rolling Right: 4: Min assist;With rail Rolling Right Details (indicate cue type and reason): Cues to bend bil LEs and roll completely before pushign up to sit Left Sidelying to Sit: 3: Mod assist Left Sidelying to Sit Details (indicate cue type and reason): Increased effort and pain with coming up to sit'  Transfers Transfers: Yes Sit to Stand: 2: Max assist;With upper extremity assist;From chair/3-in-1 Stand to Sit: 2: Max assist;With upper extremity assist;To chair/3-in-1 Squat Pivot Transfers: 3: Mod assist Ambulation/Gait Ambulation/Gait Assistance: 4: Min assist Ambulation/Gait Assistance Details (indicate cue type and reason): A for LE lifting and placing due to bil LE foot drop, A for seaight shifting to unweight opposite LE, blocking knees Ambulation Distance (Feet): 8  Feet Assistive device: Rolling walker Gait Pattern: Decreased dorsiflexion - right;Decreased dorsiflexion - left;Right foot flat;Left foot flat;Right  genu recurvatum;Left genu recurvatum;Trunk flexed;Right hip hike Stairs: No (Unsafe t this time) Architect: Yes Wheelchair Assistance: 4: Min Lexicographer: Both upper extremities Wheelchair Parts Management: Supervision/cueing (Cues for maintaining back precautions with parts management) Distance: 150 ADL:   Cognition: Cognition Overall Cognitive Status: Appears within functional limits for tasks assessed Arousal/Alertness: Awake/alert Orientation Level: Oriented to person;Oriented to place Memory: Appears intact Awareness: Appears intact Problem Solving: Appears intact Safety/Judgment: Appears intact Cognition Arousal/Alertness: Awake/alert Orientation Level: Oriented to person;Oriented to place  1.Lumbar stenosis with radiculopathy-S/P laminectomy Lumbar 4-5 and L 5 S1 11/14.Back corset when out of bed.Routine back precautions. Consider b/l AFO's for gait, but with edema presently, i'm not anxious to order. Edema control.  2. DVT Prophylaxis/Anticoagulation: SCD,s/ and support hose.Monitor for any signs of DVT.   3. Pain Management: percocet prn.Monitor with increased activity. Will schedule a percocet with am and afternoon therapies. Doing very well with pain this am.  4.Ischemic Colitis-S/P exp lap with right hemicolectomy and ileostomy 11/24.Provide ileostomy care and education. She is doing some ostomy care already on her own. WBC's up to 19.2 now.  Likely further ischemia.    -appreciate surgical f/u  -GI consult appreciated. rec observation for now.  -invanz   -serial cbc's  5.ESRD-dialysis as directed per renal services.Weigh patient daily.Follow up labs with dialysis.    6Chronic anemia-aranesp with HD.Follow up CBC with HD and transfuse if symptomatic.   7.HTN-No present meds and monitor with increased activity.   8. Mood: ego support. She seems to be in great spirits and quite motivated. Will follow  closely.       LOS (Days) 8 A FACE TO FACE EVALUATION WAS PERFORMED  SWARTZ,ZACHARY T 03/05/2011, 7:25 AM

## 2011-03-05 NOTE — Progress Notes (Signed)
Physical Therapy Session Note  Patient Details  Name: [name] MRN: [id number] Date of Birth: 1950-07-15  Today's Date: 03/05/2011 Time: 0805-0905 Time Calculation (min): 60 min  Precautions: Precautions Precautions: Back Precaution Booklet Issued: No Precaution Comments: Pt. recalled 3/3 back precautions with min cues. Donned back brace with Mod A. Required Braces or Orthoses: Yes Spinal Brace: Lumbar corset Restrictions Weight Bearing Restrictions: No  Short Term Goals: PT Short Term Goal 1: Pt min A functional transfers PT Short Term Goal 1 - Progress: Progressing toward goal PT Short Term Goal 2: Pt min A gait 50' in controlled environment PT Short Term Goal 2 - Progress: Progressing toward goal PT Short Term Goal 3: Pt propell WC 150' in community Miod I with parts management PT Short Term Goal 3 - Progress: Progressing toward goal  Skilled Therapeutic Interventions/Progress Updates:     Pain Pain Assessment Pain Assessment: 0-10 Pain Score:   7 Pain Type: Acute pain R knee  Pain Intervention(s):RN notified; Medication during tx;   Mobility: Attempted wearing Bil Allard AFOs, but PT unable to don shoes over themtransfer instruction for hip flexion, scooting, foot placement.  Limited by R knee pain this AM.  Attempted sit> stand x 4; pt unable.  Eventually, pt did squat/pivot transfer bed> W/C with mod A, multiple VCs for hand placement, pivot. Sit> stand at end of session at sink in room, with mod A, pulling up on sink.  Pt did oral hygiene with forearms resting on edges of sink, with frequent buckling of knees, requiring mod A for safety and VCs for knee stability.    Locomotion: w/c on tile in controlled environment, x 120' with min A, VCs for longer stroke, lifting hands off rims,  for increased efficiency.  Pt reported that her home is w/c accessible, but has thick carpet.        Therapy/Group: Individual Therapy  France Lusty 03/05/2011, 9:12 AM

## 2011-03-05 NOTE — Progress Notes (Signed)
Physical Therapy Session Note  Patient Details  Name: [name] MRN: [id number] Date of Birth: [birth year]  Today's Date: 03/05/2011 Time: 1340-1420 Time Calculation (min): 40 min  Precautions: Precautions Precautions: Back Required Braces or Orthoses: Yes Spinal Brace: Lumbar corset when OOB Restrictions Weight Bearing Restrictions: No  Short Term Goals: PT Short Term Goal 1: Pt min A functional transfers PT Short Term Goal 1 - Progress: Progressing toward goal PT Short Term Goal 2: Pt min A gait 50' in controlled environment PT Short Term Goal 2 - Progress: Progressing toward goal PT Short Term Goal 3: Pt propell WC 150' in community Miod I with parts management PT Short Term Goal 3 - Progress: Progressing toward goal  Skilled Therapeutic Interventions/Progress Updates: Pt lethargic, nearly asleep in w/c, but able to participate in sitting therapeutic ex.  Pt required multiple therapeutic rests during session.  20 x 1 each alternating hip flexion; 10 x 1 active assistive long- arc quad extension; resisted PF with knee extension; resisted bil hip abduction and adduction.  Ball play using beach ball, x 5 minutes, with overhead catch, and punches.  Pt requested going back to bed.  Squat/pivot transfer to L with min A.  Scooting to R in sitting with S. Doffed corset with min A.   Sit> supine with mod  A for lifting bil LEs.  Discussed pt's lethargy and O2 sats with RN; meds are being adjusted.       Vital Signs;  O2 sat via hand- held pulse oximeter = 80% at start of tx, on room air.  After deep breathing instruction, O2 sats increased to 86%, then 93-96% throughout session.   Pain Pain Assessment Pain Assessment: 0-10 Pain Score:   4 (low back) 2nd Pain Site Pain Score: 8 Pain Location: Knee (R knee) Pain Intervention(s): RN made aware (premedicated)         Therapy/Group: Individual Therapy  Alfonso Shackett 03/05/2011, 2:58 PM

## 2011-03-05 NOTE — Progress Notes (Signed)
Pt was Tachy with HR 122 when arrived to dialysis, was confussed, and had a low grade fever (99.6). Dr. Justin Mend called and states to hold dialysis so the rehab doctors could evaluate her. 12 lead EKG done and culture draw pendilng

## 2011-03-05 NOTE — Progress Notes (Signed)
Filed Vitals:   03/05/11 0531 03/05/11 1524 03/05/11 1625 03/05/11 1632  BP: 109/71 120/72 119/69 116/66  Pulse: 118 120 120 123  Temp: 99.3 F (37.4 C) 99.6 F (37.6 C)    TempSrc: Oral Oral    Resp: 20 18 17 18   Height:      Weight: 53.2 kg (117 lb 4.6 oz) 56.1 kg (123 lb 10.9 oz)    SpO2: 95% 92%      CBC  Basename 03/04/11 0600  WBC 19.2*  HGB 9.1*  HCT 28.9*  PLT 338   Patient seen for neurosurgical followup. Case discussed with Silvestre Mesi, PA from inpatient rehabilitation, who explain to the patient has been making slow progress. He noted that she's had fever and that Dr. Justin Mend was considering blood cultures and CT of the abdomen.  At this point she's about a month out from her lumbar decompression and fusion she still has weakness of dorsiflexion bilaterally. Clearly she'll continue to need therapy for reconditioning and strengthening.   Plan: Patient need to followup in a few weeks when she is discharged from rehabilitation in the office for x-rays of the low back.

## 2011-03-05 NOTE — Progress Notes (Signed)
Occupational Therapy Session Note  Patient Details  Name: [name] MRN: [id number] Date of Birth: 03-05-1951  Today's Date: 03/05/2011 Time: 0915-1000 Time Calculation (min): 45 min  Precautions: Precautions Precautions: Back Precaution Booklet Issued: No Required Braces or Orthoses: Yes Spinal Brace: Lumbar corset Restrictions Weight Bearing Restrictions: No  Short Term Goals: OT Short Term Goal 1: Patient will perform transfers at min assist level using adaptive equipment prn OT Short Term Goal 2: Patient will be able to verbalize and adhere to 3/3 back preacautions independently  OT Short Term Goal 3: Patient will donn back brace with set-up assist OT Short Term Goal 4: ADL short term goals = ADL long term goals  Skilled Therapeutic Interventions/Progress Updates:    Patient refused bathing and dressing secondary to patients complaints of pain and being cold. Engaged in therapeutic activity in ADL apartment kitchen focusing on increasing activity tolerance/endurance, functional use of bilateral upper extremities, and trunk/core control. Patient with more than reasonable amount of time to complete activity. Had patient propel/maneuver w/c back to room; patient with confusion in hallway, unsure of which room was hers.  Pain Patient complained of 7/10 pain throughout bilateral lower extremities; RN aware.   Therapy/Group: Individual Therapy  Janai Brannigan 03/05/2011, 10:28 AM

## 2011-03-05 NOTE — Progress Notes (Signed)
  Subjective: Pt ok. No c/o. Feeling weak but coming along. Appetite ok. She denies abd pain. She denies any further Blood per rectum since the last a couple days ago. Appreciate GI re-eval, no further scope at this time.  Objective: Vital signs in last 24 hours: Temp:  [99.3 F (37.4 C)] 99.3 F (37.4 C) (12/18 0531) Pulse Rate:  [118] 118  (12/18 0531) Resp:  [20] 20  (12/18 0531) BP: (109)/(71) 109/71 mmHg (12/18 0531) SpO2:  [95 %] 95 % (12/18 0531) Weight:  [53.2 kg (117 lb 4.6 oz)] 117 lb 4.6 oz (53.2 kg) (12/18 0531) Last BM Date: 03/04/11  Intake/Output this shift: Total I/O In: 240 [P.O.:240] Out: -   Physical Exam: BP 109/71  Pulse 118  Temp(Src) 99.3 F (37.4 C) (Oral)  Resp 20  Ht 5\' 5"  (1.651 m)  Wt 53.2 kg (117 lb 4.6 oz)  BMI 19.52 kg/m2  SpO2 95% Abdomen: soft, ND, NT  Labs: CBC  Basename 03/04/11 0600 03/02/11 1703  WBC 19.2* 19.2*  HGB 9.1* 9.8*  HCT 28.9* 30.9*  PLT 338 418*   BMET  Basename 03/02/11 1704  NA 133*  K 4.2  CL 96  CO2 23  GLUCOSE 116*  BUN 21  CREATININE 5.75*  CALCIUM 9.6   LFT  Basename 03/02/11 1704  PROT --  ALBUMIN 1.9*  AST --  ALT --  ALKPHOS --  BILITOT --  BILIDIR --  IBILI --  LIPASE --   PT/INR No results found for this basename: LABPROT:2,INR:2 in the last 72 hours ABG No results found for this basename: PHART:2,PCO2:2,PO2:2,HCO3:2 in the last 72 hours  Studies/Results: No results found.  Assessment: Principal Problem:  *Radiculopathy of lumbar region Ischemic colitis s/p (R)hemicolectomy with ileostomy. Residual ischemic colitis of proximal segment of Hartman's pouch. WBC up to 19 but then leveled off, not checked yet today. Hgb stable range between 9-10 Plan: Agree to follow clinically. Recheck CBC  LOS: 8 days    Federico Flake 03/05/2011

## 2011-03-05 NOTE — Progress Notes (Signed)
Pt alert but confused at times. Lethargic most of shift. Mod assist today. Pain ongoing 8/10 generalized. Pt participates in ostomy care by helping to clean ostomy bag. Medline incision w/steri-strips in place. IJ  To right side of neck dry/clean and intact. Lumber corset used when OOB. Graft (+) for bruit and thrill. Family in to visit today. Continue plan of care.

## 2011-03-06 ENCOUNTER — Inpatient Hospital Stay (HOSPITAL_COMMUNITY): Payer: Medicare Other

## 2011-03-06 DIAGNOSIS — M79609 Pain in unspecified limb: Secondary | ICD-10-CM

## 2011-03-06 LAB — BASIC METABOLIC PANEL
CO2: 29 mEq/L (ref 19–32)
Chloride: 97 mEq/L (ref 96–112)
GFR calc non Af Amer: 10 mL/min — ABNORMAL LOW (ref >90)
Glucose, Bld: 87 mg/dL (ref 70–99)
Potassium: 3.3 mEq/L — ABNORMAL LOW (ref 3.5–5.1)
Sodium: 138 mEq/L (ref 135–145)

## 2011-03-06 LAB — CBC
Hemoglobin: 8.8 g/dL — ABNORMAL LOW (ref 12.0–15.0)
Platelets: 291 10*3/uL (ref 150–400)

## 2011-03-06 MED ORDER — SODIUM CHLORIDE 0.9 % IV SOLN: Freq: Once | INTRAVENOUS | Status: DC

## 2011-03-06 NOTE — Consult Note (Signed)
Ostomy follow-up:  Daughter at bedside.  Able to cut and apply one piece pouch, and apply and remove clamp.  Stoma 13/4 inches, red and viable, above skin level.  Peristomal skin intact.  Pt feeling poorly and did not participate in activities.  Mod (100cc) liquid brown stool in pouch.  Discussed dietary precautions and ordering supplies with daughter. [facility] discharge package has been received at home.   Julien Girt, RN, MSN, [employer]  [phone]

## 2011-03-06 NOTE — Progress Notes (Signed)
The patient continues to have low-grade abdominal pain and also had a small amount of rectal bleeding today. Her white count remains slightly elevated at 20,100 and she has had a low-grade fever of 100.9. Exam of the abdomen discloses no overt tenderness.  Discussion and plan: At this time, I think there is enough evidence of smoldering ischemia in the colonic remnant to justify a repeat look by doing an unprepped colonoscopy of her extended Hartmann's pouch. The nature, purpose, and risks of this exam were reviewed with the patient and she is agreeable.  Cleotis Nipper, M.D. [phone]

## 2011-03-06 NOTE — Progress Notes (Signed)
Patient ID: [name], female   DOB: 10/18/1950, 60 y.o.   MRN: [id number]   S:Weak, confused and lethargic this morning- unable to participate fully in PT/OT Febrile this AM and tachycardic overnight- the latter of which prevented completion of dialysis yesterday   O: BP 133/74  Pulse 133  Temp(Src) 100.9 F (38.3 C) (Oral)  Resp 20  Ht 5\' 5"  (1.651 m)  Wt 52.2 kg (115 lb 1.3 oz)  BMI 19.15 kg/m2  SpO2 96%  [id number] and disoriented laying in bed, arouses to voice. [id number] regular tachycardia, s1/s2 with ESM Resp:CTA bilaterally, no rales/wheeze [id number], mildly tender around LLQ, BS normal [id number] LE edema      . calcium carbonate  400 mg of elemental calcium Oral TID WC  . darbepoetin (ARANESP) injection - DIALYSIS  200 mcg Intravenous Q Tue-HD  . ertapenem  500 mg Intravenous Q24H  . feeding supplement (NEPRO CARB STEADY)  237 mL Oral TID WC  . megestrol  200 mg Oral BID  . multivitamin  1 tablet Oral Daily  . oxyCODONE-acetaminophen  1 tablet Oral BID  . oxyCODONE-acetaminophen      . pantoprazole  40 mg Oral Q1200  . DISCONTD: darbepoetin (ARANESP) injection - DIALYSIS  200 mcg Intravenous Q Tue-HD    BMET  Lab 03/06/11 0615 03/05/11 1849 03/02/11 1704 02/28/11 2236  NA 138 134* 133* 135  K 3.3* 3.6 4.2 3.9  CL 97 94* 96 95*  CO2 29 27 23 26   GLUCOSE 87 102* 116* 111*  BUN 20 41* 21 22  CREATININE 4.37* 7.18* 5.75* 5.76*  ALB -- -- -- --  CALCIUM 9.5 9.6 9.6 9.1  PHOS -- 2.2* 2.0* 2.5   CBC  Lab 03/06/11 0615 03/05/11 1848 03/04/11 0600 03/02/11 1703 02/28/11 0545  WBC 20.1* 19.5* 19.2* 19.2* --  NEUTROABS -- -- -- -- 8.6*  HGB 8.8* 8.7* 9.1* 9.8* --  HCT 27.6* 27.8* 28.9* 30.9* --  MCV 87.6 88.3 89.5 90.4 --  PLT 291 393 338 418* --      Assessment/Plan: 1.Fever,Leukocytosis,Tachycardia, Altered LOC: Ongoing septic screen and awaiting CT abdomen/pelvis from earlier today to evaluate from intra-abdominal source of infection. May  need CT head if AMS persists after stoppage of Narcotics/neurontin. 2.ESRD: Plan for hemodialysis tomorrow, no acute needs identified at this time. 3. Anemia:Low Hgb, on max dose ESA, will check iron stores. 4. CKD-MBD: Low phosphorus, will stop TID calcium carbonate 5. Nutrition: Limited by recent events, monitor intake and continue Nepro supplement  Adrion Menz K.

## 2011-03-06 NOTE — Progress Notes (Signed)
Occupational Therapy Note  Patient Details  Name: [name] MRN: [id number] Date of Birth: 1950-12-27 Today's Date: 03/06/2011  Patient missed 30 minutes of OT secondary to off unit.   Sten Dematteo 03/06/2011, 1:40 PM

## 2011-03-06 NOTE — Progress Notes (Signed)
Physical Therapy Weekly Progress Note  Patient Details  Name: [name] MRN: [id number] Date of Birth: [birth year]  Today's Date: 03/06/2011 Time:  12:27   Patient has met 1 of 3 short term goals.  Due to limited participation in therapy secondary to limited endurance.  Patient continues to demonstrate the following deficits: decreased LE strength, decreased endurance and tolerance for activity/generalized weakness, bilateral foot drop and therefore will continue to benefit from skilled PT intervention to enhance overall performance with activity tolerance, balance, postural control and ability to compensate for deficits.  Patient not progressing toward long term goals.  See goal revision..  Pt. Is currently on bed rest for questionable DVT in LLE, awaiting results.  Pt also with mental status changes that started 03/05/11.  Overall, pt seems depressed and requires a lot of encouragement to participate in therapies.  She is inconsistent with transfers at a min@ to occasional supervision level.  Gait training has been limited due to foot drop, swelling in feet, and generalized weakness.  W/c mobility limited to generalized weakness.  LTGs have been revised to reflect limited progress.  Please see Care Plan for details.  Pt now working toward [place] when able to resume therapy.  PT Short Term Goals PT Short Term Goal 1: Pt min A functional transfers PT Short Term Goal 1 - Progress: Met PT Short Term Goal 2: Pt min A gait 50' in controlled environment PT Short Term Goal 2 - Progress: Progressing toward goal PT Short Term Goal 3: Pt propell WC 150' in community Miod I with parts management PT Short Term Goal 3 - Progress: Progressing toward goal  Therapy Documentation Precautions: Precautions Precautions: Back Precaution Booklet Issued: No Precaution Comments: Pt. recalled 3/3 back precautions with min cues. Donned back brace with Mod verbal cues Required Braces or Orthoses: Yes Spinal  Brace: Lumbar corset Restrictions Weight Bearing Restrictions: No   General Amount of Missed PT Time (min): 60 Minutes  Pt not seen for therapy today secondary to bed rest.    Waylan Boga 03/06/2011, 12:19 PM

## 2011-03-06 NOTE — Progress Notes (Signed)
Occupational Therapy Note  Patient Details  Name: [name] MRN: [id number] Date of Birth: 1950/11/20 Today's Date: 03/06/2011  Upon entering room, patient supine in bed. As soon as therapist walked in, patient with flat affect and barely able to keep eyes open. Encouraged patient to participate in therapy and at least get dressed. Patient refused, "I just can't do anything right now". Therapist continued to encourage patient and begin to help sit patient edge of bed, patient continued to refuse and pulled covers over legs stating "its cold!."  Patient seemed fabricated on having a massage even once the subject was changed. Patient also thought today was Sunday. RN aware of refusal and confusion.   Missed 45 Minutes of OT  Genea Rheaume 03/06/2011, 10:26 AM

## 2011-03-06 NOTE — Progress Notes (Signed)
Subjective/Complaints: Review of Systems  Musculoskeletal: Positive for back pain.  All other systems reviewed and are negative.  Increased confusion noted by staff overnight.  She complains of right thigh pain but no abdominal pain.   Objective: Vital Signs: Blood pressure 133/74, pulse 133, temperature 100.9 F (38.3 C), temperature source Oral, resp. rate 20, height 5\' 5"  (1.651 m), weight 52.2 kg (115 lb 1.3 oz), SpO2 96.00%. No results found.  Basename 03/06/11 0615 03/05/11 1848  WBC 20.1* 19.5*  HGB 8.8* 8.7*  HCT 27.6* 27.8*  PLT 291 393    Basename 03/05/11 1849  NA 134*  K 3.6  CL 94*  CO2 27  GLUCOSE 102*  BUN 41*  CREATININE 7.18*  CALCIUM 9.6   CBG (last 3)   Basename 03/03/11 0734  GLUCAP 106*    Wt Readings from Last 3 Encounters:  03/06/11 52.2 kg (115 lb 1.3 oz)  02/24/11 56.4 kg (124 lb 5.4 oz)  02/24/11 56.4 kg (124 lb 5.4 oz)    Physical Exam:  General appearance: alert, cooperative and no distress Head: Normocephalic, without obvious abnormality, atraumatic Eyes: conjunctivae/corneas clear. PERRL, EOM's intact. Fundi benign. Ears: normal TM's and external ear canals both ears Nose: Nares normal. Septum midline. Mucosa normal. No drainage or sinus tenderness. Throat: lips, mucosa, and tongue normal; teeth and gums normal Neck: no adenopathy, no carotid bruit, no JVD, supple, symmetrical, trachea midline and thyroid not enlarged, symmetric, no tenderness/mass/nodules Back: symmetric, no curvature. ROM normal. No CVA tenderness. Resp: clear to auscultation bilaterally Cardio: regular rate and rhythm, S1, S2 normal, no murmur, click, rub or gallop GI: soft, non-tender; bowel sounds normal; no masses,  no organomegaly Extremities: extremities normal, atraumatic, no cyanosis or edema slight edema distally only Pulses: 2+ and symmetric Skin: Skin color, texture, turgor normal. No rashes or lesions Neurologic: continued foot drop tr/5 but pt  denies sensory changes. Plantarflexion 3-4/5.  Generalized weakness in proximal legs 3/5.  Heel cords loose Incision/Wound: wounds all clean and intact.  Ostomy site sealed and stool of normal consistency Right thigh tender, but no gross swelling.  Skin is warm throughout.   Assessment/Plan: 1. Functional deficits secondary to lumbar stenosis with radiculopathy s/p decompression and ischemic colitis requiring colectomy which require 3+ hours per day of interdisciplinary therapy in a comprehensive inpatient rehab setting. Physiatrist is providing close team supervision and 24 hour management of active medical problems listed below. Physiatrist and rehab team continue to assess barriers to discharge/monitor patient progress toward functional and medical goals.  Bed rest.  May need to be transferred for closer medical observation.  Mobility: Bed Mobility Bed Mobility: Yes Rolling Right: 4: Min assist;With rail Rolling Right Details (indicate cue type and reason): Cues to bend bil LEs and roll completely before pushign up to sit Left Sidelying to Sit: 3: Mod assist Left Sidelying to Sit Details (indicate cue type and reason): Increased effort and pain with coming up to sit'  Transfers Transfers: Yes Sit to Stand: 2: Max assist;With upper extremity assist;From chair/3-in-1 Stand to Sit: 2: Max assist;With upper extremity assist;To chair/3-in-1 Squat Pivot Transfers: 3: Mod assist Ambulation/Gait Ambulation/Gait Assistance: 4: Min assist Ambulation/Gait Assistance Details (indicate cue type and reason): A for LE lifting and placing due to bil LE foot drop, A for seaight shifting to unweight opposite LE, blocking knees Ambulation Distance (Feet): 8 Feet Assistive device: Rolling walker Gait Pattern: Decreased dorsiflexion - right;Decreased dorsiflexion - left;Right foot flat;Left foot flat;Right genu recurvatum;Left genu recurvatum;Trunk flexed;Right hip hike  Stairs: No (Unsafe t this  time) Architect: Yes Wheelchair Assistance: 4: Energy manager: Both upper extremities Wheelchair Parts Management: Supervision/cueing (Cues for maintaining back precautions with parts management) Distance: 150 ADL:   Cognition: Cognition Overall Cognitive Status: Appears within functional limits for tasks assessed Arousal/Alertness: Lethargic Orientation Level: Oriented X4 Memory: Impaired (forgot date after 1 minute) Awareness: Appears intact Problem Solving: Appears intact Safety/Judgment: Appears intact Cognition Arousal/Alertness: Lethargic Orientation Level: Oriented X4  1.Lumbar stenosis with radiculopathy-S/P laminectomy Lumbar 4-5 and L 5 S1 11/14.Back corset when out of bed.Routine back precautions. Consider b/l AFO's for gait, but with edema presently, i'm not anxious to order. Edema control.  2. DVT Prophylaxis/Anticoagulation: SCD,s/ and support hose. Need to check venous dopplers to r/o thrombus as a source of temp, leukocytosis, tachy and left leg pain.  3. Pain Management: percocet. neurontin stopped.  4.Ischemic Colitis-S/P exp lap with right hemicolectomy and ileostomy 11/24.Provide ileostomy care and education. She is doing some ostomy care already on her own. WBC's up to 19.2 now.  Likely further ischemia.    -appreciate surgical f/u  -GI and surgery are following.  -invanz   -WBC's continue to rise-  Probably needs another CT of abdomen pending dopplers.  5.ESRD-dialysis as directed per renal services.Weigh patient daily.Follow up labs with dialysis.    6Chronic anemia-aranesp with HD.Follow up CBC with HD and transfuse if symptomatic.   7.HTN-No present meds and monitor with increased activity.   8. AMS:  Medically related. See above.  neurontin stopped yesterday.       LOS (Days) 9 A FACE TO FACE EVALUATION WAS PERFORMED  Clytie Shetley T 03/06/2011, 7:27 AM

## 2011-03-06 NOTE — Progress Notes (Signed)
Subjective: Somewhat more lethargic this am. C/o of more (R)sided back pain. Also with low grade fever and tachycardia. CT was ordered showing (R)post abd fluid collection. Concern for abscess She otherwise feels abdomen is pretty good, no sig pain aside from occasional soreness. Objective: Vital signs in last 24 hours: Temp:  [97.2 F (36.2 C)-100.9 F (38.3 C)] 100 F (37.8 C) (12/19 1030) Pulse Rate:  [120-139] 125  (12/19 1030) Resp:  [16-21] 20  (12/19 1030) BP: (110-139)/(65-81) 110/71 mmHg (12/19 1030) SpO2:  [95 %-96 %] 96 % (12/19 0536) Weight:  [52.1 kg (114 lb 13.8 oz)-52.2 kg (115 lb 1.3 oz)] 115 lb 1.3 oz (52.2 kg) (12/19 0552) Last BM Date: 03/06/11  Intake/Output this shift: Total I/O In: 120 [P.O.:120] Out: -   Physical Exam: BP 110/71  Pulse 125  Temp(Src) 100 F (37.8 C) (Oral)  Resp 20  Ht 5\' 5"  (1.651 m)  Wt 52.2 kg (115 lb 1.3 oz)  BMI 19.15 kg/m2  SpO2 96% Abdomen: soft, ND, NT Mildly + (R)CVAT  Labs: CBC  Basename 03/06/11 0615 03/05/11 1848  WBC 20.1* 19.5*  HGB 8.8* 8.7*  HCT 27.6* 27.8*  PLT 291 393   BMET  Basename 03/06/11 0615 03/05/11 1849  NA 138 134*  K 3.3* 3.6  CL 97 94*  CO2 29 27  GLUCOSE 87 102*  BUN 20 41*  CREATININE 4.37* 7.18*  CALCIUM 9.5 9.6   LFT  Basename 03/05/11 1849  PROT --  ALBUMIN 1.8*  AST --  ALT --  ALKPHOS --  BILITOT --  BILIDIR --  IBILI --  LIPASE --   PT/INR No results found for this basename: LABPROT:2,INR:2 in the last 72 hours ABG No results found for this basename: PHART:2,PCO2:2,PO2:2,HCO3:2 in the last 72 hours  Studies/Results: Ct Abdomen Pelvis Wo Contrast  03/06/2011  *RADIOLOGY REPORT*  Clinical Data: Ischemic colitis.  Elevated white blood cell count. Rule out infectious process.  CT ABDOMEN AND PELVIS WITHOUT CONTRAST  Technique:  Multidetector CT imaging of the abdomen and pelvis was performed following the standard protocol without intravenous contrast.   Comparison: 02/14/2011  Findings: Left base collapse / consolidative change which is slightly improved since the prior CT.  Cardiomegaly with minimal bilateral pleural thickening.  Normal uninfused appearance of the liver.  Slight decrease in right cardiophrenic angle nodal size, favored to be reactive.  7 mm maximum today.  Normal spleen, stomach.  Mild to moderately degraded exam, secondary lack of IV contrast and extensive beam hardening artifact from lumbar spine fixation.  The mild dilatation of the pancreatic duct within the head is less apparent today on image 30.  The gallbladder is partially contracted.  Prior wall thickening at 8 mm could be secondary.  Suboptimally evaluated.  Adrenal glands are poorly evaluated.  Surgical clips in the region of the right renal fossa. Numerous low density left renal lesions, likely cysts.  Age advanced aortic and branch vessel atherosclerosis.  Limited evaluation for retroperitoneal adenopathy.  The colon is decompressed.  Probable right hemicolectomy and long Hartmann's pouch.  Small bowel loops are incompletely opacified with enteric contrast. A right lower quadrant ileostomy is identified.  There is diffuse mesenteric and subcutaneous edema, which has improved since the prior exam.  The ill-defined fluid and gas within the right posterior abdomen and extending into the right flank is somewhat more well-circumscribed today.  Measures greater than fluid density, at between 27 and 33 HU.  Example pocket measures 4.8 x 6.9 cm  on image 30 and 31 of series 3.  Is contiguous with the the gallbladder on image 27.  Extends to the cephalad aspect of the right iliacus muscle.  No residual gas identified within.  Limited evaluation for pelvic sidewall adenopathy.  Normal urinary bladder.  Dystrophic and vascular calcifications within the uterus. Nonspecific presacral edema.  Interval removal of a rectal drain.  No acute osseous abnormality.  IMPRESSION:  1.  Moderately degraded  exam, secondary to lack of IV contrast and presence of beam hardening artifact from lumbar sacral spine fixation. 2.  Somewhat more well-defined complex fluid collection in the right posterior abdomen and abdominal wall/flank.  Although this could represent an evolving hematoma, infected hematoma or abscess cannot be excluded.  Sampling should be considered.  This collection is an contact with the inferior aspect of the gallbladder, which is contracted and has a thick wall.  A component of acute cholecystitis cannot be excluded.  Correlate with right upper quadrant symptoms and consider ultrasound. 3.  Improved left base aeration with atelectasis versus less likely infection remaining.  Original Report Authenticated By: Areta Haber, M.D.    Assessment/Plan: Principal Problem:  *Radiculopathy of lumbar region s/p (R)Hemicolectomy with ileostomy for ischemic colitis.  Post op fluid collections and changes overall better than CT from 11/29. (R)post abd collection does look a little more organized and may represent abscess. LGIB from remaining colonic segment appears to be less frequent, poss repeat scope tomorrow to reassess. Will ask IR to look at CT for possible aspiration/drain placement, see if we can get some cultures. Currently on Invanz, though WBC continue to rise.   LOS: 9 days    [name] 03/06/2011

## 2011-03-06 NOTE — Progress Notes (Signed)
Pt alert and responsive. Not feeling well at this time dialysis yesterday v/s 100.0 -110/71 - 125 - 20 - 96% RA. PA made aware. Further follow-up. Refused OT this a.m. and getting dressed. Currently Mod assist. Pain 8/10 with schedule medication. Doppler of rt leg done due to constant leg pain. Doppler negative. CT scan done of Abd/pelvic results pending. Brown loose stool from illestomy with 300cc output at present time. Pt requested to sit on toilet and had a small bloody stool. PA notified. Family at bedside. Daughter educated on changing illestomy.  Allevyn dressing to sacrum intact. Graft to rt upper arm open to air. Continue with plan of care.

## 2011-03-06 NOTE — Progress Notes (Addendum)
{  All PT Physical Therapy Session Note  Patient Details  Name: [name] MRN: [id number] Date of Birth: 1950-04-19  Today's Date: 03/06/2011 Time:  8:43   Note MD order for bed rest.  Pt c/o left LE pain, RN reports, pt scheduled for doppler, due to ? DVT.  Will resume therapy when appropriate. Pt. Missed 60 min of therapy.  Waylan Boga 03/06/2011, 8:43 AM

## 2011-03-06 NOTE — Progress Notes (Signed)
*  PRELIMINARY RESULTS* Bilateral lower extremity venous duplex completed. No evidence of DVT, superficial thrombosis, or Baker's cyst  [place], [place] D 03/06/2011, 9:47 AM

## 2011-03-07 ENCOUNTER — Other Ambulatory Visit (HOSPITAL_COMMUNITY): Payer: Medicare Other

## 2011-03-07 ENCOUNTER — Encounter (HOSPITAL_COMMUNITY): Payer: Self-pay | Admitting: *Deleted

## 2011-03-07 ENCOUNTER — Encounter (HOSPITAL_COMMUNITY)
Admission: RE | Disposition: A | Discharge status: Home Health | Payer: Self-pay | Source: Ambulatory Visit | Attending: Physical Medicine & Rehabilitation

## 2011-03-07 ENCOUNTER — Other Ambulatory Visit: Payer: Self-pay | Admitting: Gastroenterology

## 2011-03-07 HISTORY — PX: COLONOSCOPY: SHX5424

## 2011-03-07 LAB — COMPREHENSIVE METABOLIC PANEL
ALT: 5 U/L (ref 0–35)
Alkaline Phosphatase: 110 U/L (ref 39–117)
CO2: 25 mEq/L (ref 19–32)
Calcium: 9.7 mg/dL (ref 8.4–10.5)
Chloride: 96 mEq/L (ref 96–112)
GFR calc Af Amer: 9 mL/min — ABNORMAL LOW (ref >90)
GFR calc non Af Amer: 7 mL/min — ABNORMAL LOW (ref >90)
Glucose, Bld: 69 mg/dL — ABNORMAL LOW (ref 70–99)
Sodium: 136 mEq/L (ref 135–145)
Total Bilirubin: 0.3 mg/dL (ref 0.3–1.2)

## 2011-03-07 LAB — CBC
Hemoglobin: 8.1 g/dL — ABNORMAL LOW (ref 12.0–15.0)
MCH: 27.2 pg (ref 26.0–34.0)
RBC: 2.98 MIL/uL — ABNORMAL LOW (ref 3.87–5.11)
WBC: 18.5 10*3/uL — ABNORMAL HIGH (ref 4.0–10.5)

## 2011-03-07 LAB — GLUCOSE, CAPILLARY: Glucose-Capillary: 73 mg/dL (ref 70–99)

## 2011-03-07 SURGERY — COLONOSCOPY
Anesthesia: Moderate Sedation

## 2011-03-07 MED ORDER — DARBEPOETIN ALFA-POLYSORBATE 200 MCG/0.4ML IJ SOLN
200.0000 ug | INTRAMUSCULAR | Status: DC
  Administered 2011-03-07 – 2011-03-21 (×3): 200 ug via INTRAVENOUS
  Filled 2011-03-07 (×3): qty 0.4

## 2011-03-07 MED ORDER — OXYCODONE-ACETAMINOPHEN 5-325 MG PO TABS
ORAL_TABLET | ORAL | Status: AC
  Administered 2011-03-07: 1 via ORAL
  Filled 2011-03-07: qty 2

## 2011-03-07 MED ORDER — WHITE PETROLATUM GEL
Status: AC
  Filled 2011-03-07: qty 5

## 2011-03-07 MED ORDER — SODIUM CHLORIDE 0.9 % IV SOLN: 250.0000 mL | Freq: Once | INTRAVENOUS | Status: DC

## 2011-03-07 MED ORDER — MIDAZOLAM HCL 5 MG/5ML IJ SOLN
INTRAMUSCULAR | Status: DC | PRN
  Administered 2011-03-07: 1 mg via INTRAVENOUS
  Administered 2011-03-07 (×2): 2 mg via INTRAVENOUS

## 2011-03-07 MED ORDER — MIDAZOLAM HCL 10 MG/2ML IJ SOLN
INTRAMUSCULAR | Status: AC
  Filled 2011-03-07: qty 2

## 2011-03-07 MED ORDER — FENTANYL CITRATE 0.05 MG/ML IJ SOLN
INTRAMUSCULAR | Status: AC
  Filled 2011-03-07: qty 2

## 2011-03-07 MED ORDER — FENTANYL NICU IV SYRINGE 50 MCG/ML
INJECTION | INTRAMUSCULAR | Status: DC | PRN
  Administered 2011-03-07 (×2): 25 ug via INTRAVENOUS

## 2011-03-07 NOTE — Progress Notes (Signed)
Patient ID: [name], female   DOB: September 21, 1950, 60 y.o.   MRN: [id number] Subjective/Complaints: Review of Systems  Musculoskeletal: Positive for back pain.  All other systems reviewed and are negative.  States feet are sore   Objective: Vital Signs: Blood pressure 90/60, pulse 93, temperature 97.9 F (36.6 C), temperature source Oral, resp. rate 20, height 5\' 5"  (1.651 m), weight 52.8 kg (116 lb 6.5 oz), SpO2 98.00%. Ct Abdomen Pelvis Wo Contrast  03/06/2011  *RADIOLOGY REPORT*  Clinical Data: Ischemic colitis.  Elevated white blood cell count. Rule out infectious process.  CT ABDOMEN AND PELVIS WITHOUT CONTRAST  Technique:  Multidetector CT imaging of the abdomen and pelvis was performed following the standard protocol without intravenous contrast.  Comparison: 02/14/2011  Findings: Left base collapse / consolidative change which is slightly improved since the prior CT.  Cardiomegaly with minimal bilateral pleural thickening.  Normal uninfused appearance of the liver.  Slight decrease in right cardiophrenic angle nodal size, favored to be reactive.  7 mm maximum today.  Normal spleen, stomach.  Mild to moderately degraded exam, secondary lack of IV contrast and extensive beam hardening artifact from lumbar spine fixation.  The mild dilatation of the pancreatic duct within the head is less apparent today on image 30.  The gallbladder is partially contracted.  Prior wall thickening at 8 mm could be secondary.  Suboptimally evaluated.  Adrenal glands are poorly evaluated.  Surgical clips in the region of the right renal fossa. Numerous low density left renal lesions, likely cysts.  Age advanced aortic and branch vessel atherosclerosis.  Limited evaluation for retroperitoneal adenopathy.  The colon is decompressed.  Probable right hemicolectomy and long Hartmann's pouch.  Small bowel loops are incompletely opacified with enteric contrast. A right lower quadrant ileostomy is identified.  There is  diffuse mesenteric and subcutaneous edema, which has improved since the prior exam.  The ill-defined fluid and gas within the right posterior abdomen and extending into the right flank is somewhat more well-circumscribed today.  Measures greater than fluid density, at between 27 and 33 HU.  Example pocket measures 4.8 x 6.9 cm on image 30 and 31 of series 3.  Is contiguous with the the gallbladder on image 27.  Extends to the cephalad aspect of the right iliacus muscle.  No residual gas identified within.  Limited evaluation for pelvic sidewall adenopathy.  Normal urinary bladder.  Dystrophic and vascular calcifications within the uterus. Nonspecific presacral edema.  Interval removal of a rectal drain.  No acute osseous abnormality.  IMPRESSION:  1.  Moderately degraded exam, secondary to lack of IV contrast and presence of beam hardening artifact from lumbar sacral spine fixation. 2.  Somewhat more well-defined complex fluid collection in the right posterior abdomen and abdominal wall/flank.  Although this could represent an evolving hematoma, infected hematoma or abscess cannot be excluded.  Sampling should be considered.  This collection is an contact with the inferior aspect of the gallbladder, which is contracted and has a thick wall.  A component of acute cholecystitis cannot be excluded.  Correlate with right upper quadrant symptoms and consider ultrasound. 3.  Improved left base aeration with atelectasis versus less likely infection remaining.  Original Report Authenticated By: Areta Haber, M.D.    Basename 03/07/11 0510 03/06/11 0615  WBC 18.5* 20.1*  HGB 8.1* 8.8*  HCT 26.0* 27.6*  PLT 360 291    Basename 03/07/11 0510 03/06/11 0615  NA 136 138  K 3.8 3.3*  CL 96  97  CO2 25 29  GLUCOSE 69* 87  BUN 30* 20  CREATININE 5.67* 4.37*  CALCIUM 9.7 9.5   CBG (last 3)  No results found for this basename: GLUCAP:3 in the last 72 hours  Wt Readings from Last 3 Encounters:  03/07/11 52.8 kg  (116 lb 6.5 oz)  03/07/11 52.8 kg (116 lb 6.5 oz)  02/24/11 56.4 kg (124 lb 5.4 oz)    Physical Exam:  General appearance: alert, cooperative and no distress Head: Normocephalic, without obvious abnormality, atraumatic Eyes: conjunctivae/corneas clear. PERRL, EOM's intact. Fundi benign. Ears: normal TM's and external ear canals both ears Nose: Nares normal. Septum midline. Mucosa normal. No drainage or sinus tenderness. Throat: lips, mucosa, and tongue normal; teeth and gums normal Neck: no adenopathy, no carotid bruit, no JVD, supple, symmetrical, trachea midline and thyroid not enlarged, symmetric, no tenderness/mass/nodules Back: symmetric, no curvature. ROM normal. No CVA tenderness. Resp: clear to auscultation bilaterally Cardio: regular rate and rhythm, S1, S2 normal, no murmur, click, rub or gallop GI: soft, non-tender; bowel sounds normal; no masses,  no organomegaly Extremities: extremities normal, atraumatic, no cyanosis or edema slight edema distally only Pulses: 2+ and symmetric Skin: Skin color, texture, turgor normal. No rashes or lesions Neurologic: continued foot drop tr/5 but pt denies sensory changes. Plantarflexion 3-4/5.  Generalized weakness in proximal legs 3/5.  Heel cords loose Incision/Wound: wounds all clean and intact.  Ostomy site sealed.  Saw no blood in stool today. Right thigh less tender, but no gross swelling.  Skin is warm throughout.   Assessment/Plan: 1. Functional deficits secondary to lumbar stenosis with radiculopathy s/p decompression and ischemic colitis requiring colectomy which require 3+ hours per day of interdisciplinary therapy in a comprehensive inpatient rehab setting. Physiatrist is providing close team supervision and 24 hour management of active medical problems listed below. Physiatrist and rehab team continue to assess barriers to discharge/monitor patient progress toward functional and medical goals.  Up as tolerated  today.  Mobility: Bed Mobility Bed Mobility: Yes Rolling Right: 4: Min assist;With rail Rolling Right Details (indicate cue type and reason): Cues to bend bil LEs and roll completely before pushign up to sit Left Sidelying to Sit: 3: Mod assist Left Sidelying to Sit Details (indicate cue type and reason): Increased effort and pain with coming up to sit'  Transfers Transfers: Yes Sit to Stand: 2: Max assist;With upper extremity assist;From chair/3-in-1 Stand to Sit: 2: Max assist;With upper extremity assist;To chair/3-in-1 Squat Pivot Transfers: 3: Mod assist Ambulation/Gait Ambulation/Gait Assistance: 4: Min assist Ambulation/Gait Assistance Details (indicate cue type and reason): A for LE lifting and placing due to bil LE foot drop, A for seaight shifting to unweight opposite LE, blocking knees Ambulation Distance (Feet): 8 Feet Assistive device: Rolling walker Gait Pattern: Decreased dorsiflexion - right;Decreased dorsiflexion - left;Right foot flat;Left foot flat;Right genu recurvatum;Left genu recurvatum;Trunk flexed;Right hip hike Stairs: No (Unsafe t this time) Architect: Yes Wheelchair Assistance: 4: Min Lexicographer: Both upper extremities Wheelchair Parts Management: Supervision/cueing (Cues for maintaining back precautions with parts management) Distance: 150 ADL:   Cognition: Cognition Overall Cognitive Status: Appears within functional limits for tasks assessed Arousal/Alertness: Lethargic Orientation Level: Oriented to person;Oriented to place Memory: Impaired (forgot date after 1 minute) Awareness: Appears intact Problem Solving: Appears intact Safety/Judgment: Appears intact Cognition Arousal/Alertness: Lethargic Orientation Level: Oriented to person;Oriented to place  1.Lumbar stenosis with radiculopathy-S/P laminectomy Lumbar 4-5 and L 5 S1 11/14.Back corset when out of bed.Routine back precautions.  Consider b/l  AFO's for gait, but with edema presently, i'm not anxious to order. Edema control.  2. DVT Prophylaxis/Anticoagulation: SCD,s/ and support hose. Dopplers negative  3. Pain Management: percocet.prn--- neurontin stopped.  4.Ischemic Colitis-S/P exp lap with right hemicolectomy and ileostomy 11/24.Provide ileostomy care and education. She is doing some ostomy care already on her own. WBC's up to 19.2 now.  Likely further ischemia.    -needle aspiration of right posterior fluid collection today?  -GI to scope today.  -invanz   -WBC's slightly improved today.  Continue close clinical observation.  5.ESRD-dialysis as directed per renal services.Weigh patient daily.Follow up labs with dialysis.    6Chronic anemia-aranesp with HD.Follow up CBC with HD and transfuse if needed.   7.HTN-No present meds and monitor with increased activity.   8. AMS:  Medically related. See above.  neurontin stopped. Limit narcotics as possible.  Seems a little more alert today.       LOS (Days) 10 A FACE TO FACE EVALUATION WAS PERFORMED  SWARTZ,ZACHARY T 03/07/2011, 7:31 AM

## 2011-03-07 NOTE — Interval H&P Note (Signed)
History and Physical Interval Note:  03/07/2011 8:45 AM  [name]  has presented today for colonoscopy of Hartmann's pouch, with the diagnosis of Abdominal pain and rectal bleeding, followup ischemia of colon  The various methods of treatment have been discussed with the patient. After consideration of risks, benefits and other options for treatment, the patient has consented to  Procedure(s): COLONOSCOPY as a surgical intervention .  The patients' history has been reviewed, patient examined, no change in status, stable for surgery.  I have reviewed the patients' chart and labs.  Questions were answered to the patient's satisfaction.     Cleotis Nipper

## 2011-03-07 NOTE — Progress Notes (Signed)
Occupational Therapy Note  Patient Details  Name: [name] MRN: [id number] Date of Birth: 06/29/1950 Today's Date: 03/07/2011  Patient missed 30 minutes of OT secondary to off unit for HD.   Tammee Thielke 03/07/2011, 2:57 PM

## 2011-03-07 NOTE — Progress Notes (Signed)
Physical Therapy Note  Patient Details  Name: ILYN SHORTT MRN: [id number] Date of Birth: 1950-08-24 Today's Date: 03/07/2011  Pt off unit for hemodialysis. Missed 45 minutes skilled PT.   Canary Brim [facility] 03/07/2011, 1:55 PM

## 2011-03-07 NOTE — Progress Notes (Signed)
Pt alert this am, pt had colostomy and returned to unit with stable vital signs.  Pt was lethargic after procedure with stable vital signs.  Pt was unable to participate in therapy.  Pt taken to dialysis at 1300,  Report given Katharine Look

## 2011-03-07 NOTE — Progress Notes (Signed)
Physical Therapy Note  Patient Details  Name: [name] MRN: [id number] Date of Birth: 03-05-1951 Today's Date: 03/07/2011  Pt off unit for procedure, missed 60 minutes of PT.   Othelia Pulling 03/07/2011, 1:15 PM

## 2011-03-07 NOTE — Progress Notes (Signed)
Colonoscopy today basically unrevealing. Please see complete report for details. Mild mucoid exudate with minimal old blood in friability noted in the proximal colon.  Also, the patient has markedly decreased anal sphincter tone, which needs to be taken into account if she were ever to have ileostomy takedown and restoration of intestinal, M.D., because of concerns of possible incontinence.  At this time, in recovery, patient is still deeply sedated and unarousable. There is variability in her blood pressure, which is reading between [id number] and 123XX123 systolic, but there've been no cardiac arrhythmias nor oxygen desaturation.  Cleotis Nipper, M.D. [phone]

## 2011-03-07 NOTE — Op Note (Signed)
Documentation done in endoPro; see under Procedures tab.

## 2011-03-07 NOTE — Progress Notes (Signed)
Pt returned from dialysis alert and oriented to person,  Pt complains of chills.  Vital signs 98.1-126-18-99/65,  Will continue to monitor [name]

## 2011-03-07 NOTE — Progress Notes (Signed)
Patient is still very somulent, but at least she is minimally arousable at this time.  Her VS and O2 sat remain stable, although bld pressure is on the low side.  Cleotis Nipper, M.D. [phone]

## 2011-03-07 NOTE — Progress Notes (Signed)
Occupational Therapy Note  Patient Details  Name: [name] MRN: [id number] Date of Birth: January 07, 1951  Today's Date: 03/07/2011  Patient missed 60 minutes of OT secondary to off unit for procedure.   Wm Fruchter 03/07/2011, 9:57 AM

## 2011-03-07 NOTE — Op Note (Signed)
[facility] [place], [place]  [id number]  COLONOSCOPY PROCEDURE REPORT  PATIENT:  Jenna, Mccarthy  MR#:  [id number] BIRTHDATE:  April 17, 1950, 60 yrs. old  GENDER:  female ENDOSCOPIST:  Jenna Lobo, MD REF. BY:  [name], M.D., [name], M.D. PROCEDURE DATE:  03/07/2011 PROCEDURE:  Colonoscopy with biopsy ASA CLASS: INDICATIONS:   persistent pain and occasional minimal bloody discharge per rectum, and the patient who is several weeks status post right hemicolectomy with ileostomy and Hartman's pouch for ischemic colitis. Colonoscopy 2 weeks ago showed moderate mucosal abnormalities in the proximal colonic remnant MEDICATIONS:  Fentanyl 50 mcg IV, Versed 5 mg IV  DESCRIPTION OF PROCEDURE:   After the risks and benefits and of the procedure were explained, informed consent was obtained.  No rectal exam performed. The [id number] endoscope was introduced through the anus and advanced to the proximal end of the Hartmann's pouch, corresponding to approximately 110 cm of scope insertion .  The quality of the prep was excellent, despite the absence of any prep.  The instrument was then slowly withdrawn as the colon was fully examined. <<PROCEDUREIMAGES>>  FINDINGS:  There was a marked reduction in anal sphincter tone.  The scope was able to be advanced without significant difficulty to theoversewn, proximal end of the Hartman's pouch, which was characterized by some dimples or puckered mucosa with the absence of further lumen. In this region, there was a small to moderate amount of white and dark bluish mucoid exudate, which would either washed off or could be pulled off with the biopsy forceps. The underlying mucosa appeared essentially normal. Several biopsies were obtained in this area. These abnormalities extended for only a few centimeters distal to the oversewn end of the Hartmann's pouch. No frank ulcerations or overt  ischemic changes were observed.  The remainder of the mucosa of the colonic remnant was normal, except for a small patch of pink tissue, measuring about 2 or 3 cm across, with minimal overlying exudate, in the region of the splenic flexure at about 60 cm from the anus as measured during pullback. This was biopsied.  In most areas, there was a nice vascular pattern to the mucosa.  No polyps or masses were seen on this exam.  There was some mild sigmoid diverticulosis.  The rectal mucosa looked normal, specifically without evidence of proctitis. There were some internal hemorrhoids, moderately prominent.  As noted, anal sphincter tone was markedly relaxed, to the point where the retroflexed scope could literally come through the anal canal.  The scope was then withdrawn from the patient and the procedure completed.  COMPLICATIONS:  None ENDOSCOPIC IMPRESSION:  1. Markedly reduced anal sphincter tone, perhaps a reflection of her lumbar radiculopathy. 2. Mild mucoid exudate in the proximal colon, and small patch of what appears to be resolving inflammation with granulation tissue in the region of the splenic flexure, but no frank ischemic changes, polyps, masses, colitis, or other obvious source of intermittent passage of blood per rectum. The most likely explanation is either hemorrhoidal bleeding, or, depending on the appearance of the blood, some pinpoint hemorrhage is in the proximal colon weeping blood periodically, turning dark, and mixing with mucus and eventually making its way down the colon to be expelled. 3. Mild sigmoid diverticulosis. 4. The observed blood and mucus are probably a reflection of mild residual inflammatory activity and perhaps some very early "diversion colitis" but it does not appear that there is any significant process going  on, and in particular, nothing that requires any active intervention or treatment. 5. The observed findings would not  explain the patient's fever, leukocytosis, or abdominal pain.  RECOMMENDATIONS: 1. Agree with plans for attempted needle drainage of fluid collection noted on CT scan. 2. I do not think any specific treatment for the colon is needed at this time. 3. However, if takedown of her ileostomy with restoration of intestinal, gluteus contemplated at some point in the future, the patient would probably need careful assessment of the anal sphincter function to make sure that she would not cannot with an incontinence problem, based on physical examination of the anal sphincter today.  REPEAT EXAM:  No  ______________________________ Jenna Lobo, MD  CC:  n. eSIGNEDHerbie Baltimore Mccarthy [place] at 03/07/2011 09:54 AM  [name], [id number]

## 2011-03-07 NOTE — Progress Notes (Signed)
Per State Regulation 482.30 This chart was reviewed for medical necessity with respect to the patient's Admission/Duration of stay. Pt w/ medical issues which have postponed d/c.  Will do family education once pt is able & reset d/c date.  Continues on IV antibx--not sure if she will d/c on the IV med. Daughter still trying to get a ramp built.   Theora Master                 Nurse Care Manager              Next Review Date: 03/11/11

## 2011-03-07 NOTE — Procedures (Signed)
I was present at this session.  I have reviewed the session itself and made appropriate changes. QB 400 BP 92/60 UF goal 1L  Micala Saltsman K. 12/20/20124:37 PM

## 2011-03-08 ENCOUNTER — Encounter (HOSPITAL_COMMUNITY): Payer: Self-pay | Admitting: Gastroenterology

## 2011-03-08 ENCOUNTER — Inpatient Hospital Stay (HOSPITAL_COMMUNITY): Payer: Medicare Other

## 2011-03-08 DIAGNOSIS — M47817 Spondylosis without myelopathy or radiculopathy, lumbosacral region: Secondary | ICD-10-CM

## 2011-03-08 DIAGNOSIS — N186 End stage renal disease: Secondary | ICD-10-CM

## 2011-03-08 DIAGNOSIS — IMO0002 Reserved for concepts with insufficient information to code with codable children: Secondary | ICD-10-CM

## 2011-03-08 DIAGNOSIS — Z5189 Encounter for other specified aftercare: Secondary | ICD-10-CM

## 2011-03-08 MED ORDER — FENTANYL CITRATE 0.05 MG/ML IJ SOLN
INTRAMUSCULAR | Status: AC
  Filled 2011-03-08: qty 4

## 2011-03-08 MED ORDER — MIDAZOLAM HCL 2 MG/2ML IJ SOLN
INTRAMUSCULAR | Status: AC
  Filled 2011-03-08: qty 4

## 2011-03-08 MED ORDER — MIDAZOLAM HCL 5 MG/5ML IJ SOLN
INTRAMUSCULAR | Status: AC | PRN
  Administered 2011-03-08: 1 mg via INTRAVENOUS

## 2011-03-08 MED ORDER — METRONIDAZOLE IN NACL 5-0.79 MG/ML-% IV SOLN
500.0000 mg | Freq: Three times a day (TID) | INTRAVENOUS | Status: DC
  Administered 2011-03-08 – 2011-03-11 (×9): 500 mg via INTRAVENOUS
  Filled 2011-03-08 (×10): qty 100

## 2011-03-08 MED ORDER — FENTANYL CITRATE 0.05 MG/ML IJ SOLN
INTRAMUSCULAR | Status: AC | PRN
  Administered 2011-03-08 (×2): 25 ug via INTRAVENOUS

## 2011-03-08 NOTE — Progress Notes (Signed)
Patient ID: [name], female   DOB: 17-Mar-1951, 60 y.o.   MRN: [id number] Subjective/Complaints: Review of Systems  Musculoskeletal: Positive for back pain.  All other systems reviewed and are negative.  States feet are sore   Objective: Vital Signs: Blood pressure 118/79, pulse 117, temperature 98.4 F (36.9 C), temperature source Oral, resp. rate 19, height 5\' 5"  (1.651 m), weight 51.8 kg (114 lb 3.2 oz), SpO2 97.00%. Ct Abdomen Pelvis Wo Contrast  03/06/2011  *RADIOLOGY REPORT*  Clinical Data: Ischemic colitis.  Elevated white blood cell count. Rule out infectious process.  CT ABDOMEN AND PELVIS WITHOUT CONTRAST  Technique:  Multidetector CT imaging of the abdomen and pelvis was performed following the standard protocol without intravenous contrast.  Comparison: 02/14/2011  Findings: Left base collapse / consolidative change which is slightly improved since the prior CT.  Cardiomegaly with minimal bilateral pleural thickening.  Normal uninfused appearance of the liver.  Slight decrease in right cardiophrenic angle nodal size, favored to be reactive.  7 mm maximum today.  Normal spleen, stomach.  Mild to moderately degraded exam, secondary lack of IV contrast and extensive beam hardening artifact from lumbar spine fixation.  The mild dilatation of the pancreatic duct within the head is less apparent today on image 30.  The gallbladder is partially contracted.  Prior wall thickening at 8 mm could be secondary.  Suboptimally evaluated.  Adrenal glands are poorly evaluated.  Surgical clips in the region of the right renal fossa. Numerous low density left renal lesions, likely cysts.  Age advanced aortic and branch vessel atherosclerosis.  Limited evaluation for retroperitoneal adenopathy.  The colon is decompressed.  Probable right hemicolectomy and long Hartmann's pouch.  Small bowel loops are incompletely opacified with enteric contrast. A right lower quadrant ileostomy is identified.  There is  diffuse mesenteric and subcutaneous edema, which has improved since the prior exam.  The ill-defined fluid and gas within the right posterior abdomen and extending into the right flank is somewhat more well-circumscribed today.  Measures greater than fluid density, at between 27 and 33 HU.  Example pocket measures 4.8 x 6.9 cm on image 30 and 31 of series 3.  Is contiguous with the the gallbladder on image 27.  Extends to the cephalad aspect of the right iliacus muscle.  No residual gas identified within.  Limited evaluation for pelvic sidewall adenopathy.  Normal urinary bladder.  Dystrophic and vascular calcifications within the uterus. Nonspecific presacral edema.  Interval removal of a rectal drain.  No acute osseous abnormality.  IMPRESSION:  1.  Moderately degraded exam, secondary to lack of IV contrast and presence of beam hardening artifact from lumbar sacral spine fixation. 2.  Somewhat more well-defined complex fluid collection in the right posterior abdomen and abdominal wall/flank.  Although this could represent an evolving hematoma, infected hematoma or abscess cannot be excluded.  Sampling should be considered.  This collection is an contact with the inferior aspect of the gallbladder, which is contracted and has a thick wall.  A component of acute cholecystitis cannot be excluded.  Correlate with right upper quadrant symptoms and consider ultrasound. 3.  Improved left base aeration with atelectasis versus less likely infection remaining.  Original Report Authenticated By: Areta Haber, M.D.    Basename 03/07/11 0510 03/06/11 0615  WBC 18.5* 20.1*  HGB 8.1* 8.8*  HCT 26.0* 27.6*  PLT 360 291    Basename 03/07/11 0510 03/06/11 0615  NA 136 138  K 3.8 3.3*  CL 96  97  CO2 25 29  GLUCOSE 69* 87  BUN 30* 20  CREATININE 5.67* 4.37*  CALCIUM 9.7 9.5   CBG (last 3)   Basename 03/07/11 1043  GLUCAP 73    Wt Readings from Last 3 Encounters:  03/08/11 51.8 kg (114 lb 3.2 oz)    03/08/11 51.8 kg (114 lb 3.2 oz)  02/24/11 56.4 kg (124 lb 5.4 oz)    Physical Exam:  General appearance: alert, cooperative and no distress Head: Normocephalic, without obvious abnormality, atraumatic Eyes: conjunctivae/corneas clear. PERRL, EOM's intact. Fundi benign. Ears: normal TM's and external ear canals both ears Nose: Nares normal. Septum midline. Mucosa normal. No drainage or sinus tenderness. Throat: lips, mucosa, and tongue normal; teeth and gums normal Neck: no adenopathy, no carotid bruit, no JVD, supple, symmetrical, trachea midline and thyroid not enlarged, symmetric, no tenderness/mass/nodules Back: symmetric, no curvature. ROM normal. No CVA tenderness. Resp: clear to auscultation bilaterally Cardio: regular rate and rhythm, S1, S2 normal, no murmur, click, rub or gallop GI: soft, non-tender; bowel sounds normal; no masses,  no organomegaly Extremities: extremities normal, atraumatic, no cyanosis or edema slight edema distally only Pulses: 2+ and symmetric Skin: Skin color, texture, turgor normal. No rashes or lesions Neurologic: continued foot drop tr/5 but pt denies sensory changes. Plantarflexion 3-4/5.  Generalized weakness in proximal legs 3/5.  Heel cords loose Incision/Wound: wounds all clean and intact.  Ostomy site sealed.  Saw no blood in stool today. Right thigh less tender, but no gross swelling.  Skin is warm throughout.   Assessment/Plan: 1. Functional deficits secondary to lumbar stenosis with radiculopathy s/p decompression and ischemic colitis requiring colectomy which require 3+ hours per day of interdisciplinary therapy in a comprehensive inpatient rehab setting. Physiatrist is providing close team supervision and 24 hour management of active medical problems listed below. Physiatrist and rehab team continue to assess barriers to discharge/monitor patient progress toward functional and medical goals.  Up as tolerated today.  Mobility: Bed  Mobility Bed Mobility: Yes Rolling Right: 4: Min assist;With rail Rolling Right Details (indicate cue type and reason): Cues to bend bil LEs and roll completely before pushign up to sit Left Sidelying to Sit: 3: Mod assist Left Sidelying to Sit Details (indicate cue type and reason): Increased effort and pain with coming up to sit'  Transfers Transfers: Yes Sit to Stand: 2: Max assist;With upper extremity assist;From chair/3-in-1 Stand to Sit: 2: Max assist;With upper extremity assist;To chair/3-in-1 Squat Pivot Transfers: 3: Mod assist Ambulation/Gait Ambulation/Gait Assistance: 4: Min assist Ambulation/Gait Assistance Details (indicate cue type and reason): A for LE lifting and placing due to bil LE foot drop, A for seaight shifting to unweight opposite LE, blocking knees Ambulation Distance (Feet): 8 Feet Assistive device: Rolling walker Gait Pattern: Decreased dorsiflexion - right;Decreased dorsiflexion - left;Right foot flat;Left foot flat;Right genu recurvatum;Left genu recurvatum;Trunk flexed;Right hip hike Stairs: No (Unsafe t this time) Architect: Yes Wheelchair Assistance: 4: Min Lexicographer: Both upper extremities Wheelchair Parts Management: Supervision/cueing (Cues for maintaining back precautions with parts management) Distance: 150 ADL:   Cognition: Cognition Overall Cognitive Status: Appears within functional limits for tasks assessed Arousal/Alertness: Lethargic Orientation Level: Oriented to person;Oriented to place Memory: Impaired (forgot date after 1 minute) Awareness: Appears intact Problem Solving: Appears intact Safety/Judgment: Appears intact Cognition Arousal/Alertness: Lethargic Orientation Level: Oriented to person;Oriented to place  1.Lumbar stenosis with radiculopathy-S/P laminectomy Lumbar 4-5 and L 5 S1 11/14.Back corset when out of bed.Routine back precautions. Consider b/l  AFO's for gait, but with  edema presently, i'm not anxious to order. Edema control.  2. DVT Prophylaxis/Anticoagulation: SCD,s/ and support hose. Dopplers negative  3. Pain Management: percocet.prn--- neurontin stopped.  4.Ischemic Colitis-S/P exp lap with right hemicolectomy and ileostomy 11/24.Provide ileostomy care and education. She is doing some ostomy care already on her own. WBC's down to 18K now.  Colonoscopy unremarkable.  Cont IV Abx,CT guided fluid aspiration today Will likely miss therapy today.             5.ESRD-dialysis as directed per renal services.Weigh patient daily.Follow up labs with dialysis.    6Chronic anemia-aranesp with HD.Follow up CBC with HD and transfuse if needed.   7.HTN-No present meds and monitor with increased activity.   8. AMS:  Medically related. See above.  neurontin stopped. Limit narcotics as possible.  Seems a little more alert today. Oriented x 3 this am.  Meds from colonoscopy cleared but will have sedation for CT guided bx today  Will need new central line if IV abx are continued beyond 7d     LOS (Days) 11 A FACE TO FACE EVALUATION WAS PERFORMED  [name] 03/08/2011, 7:35 AM

## 2011-03-08 NOTE — Progress Notes (Signed)
1 Day Post-Op  Subjective: Pt s/p successful drain placement. Report reviewed pus removed. Pt feeling a little better.  Objective: Vital signs in last 24 hours: Temp:  [98.1 F (36.7 C)-100.9 F (38.3 C)] 98.4 F (36.9 C) (12/21 0552) Pulse Rate:  [100-137] 111  (12/21 0920) Resp:  [16-23] 23  (12/21 0920) BP: (87-118)/(29-79) 98/63 mmHg (12/21 0920) SpO2:  [93 %-100 %] 98 % (12/21 0920) FiO2 (%):  [100 %] 100 % (12/21 0908) Weight:  [50.8 kg (111 lb 15.9 oz)-51.8 kg (114 lb 3.2 oz)] 114 lb 3.2 oz (51.8 kg) (12/21 0552) Last BM Date: 03/07/11  Intake/Output this shift:    Physical Exam: BP 98/63  Pulse 111  Temp(Src) 98.4 F (36.9 C) (Oral)  Resp 23  Ht 5\' 5"  (1.651 m)  Wt 51.8 kg (114 lb 3.2 oz)  BMI 19.00 kg/m2  SpO2 98% Abdomen: soft, drain intact  Labs: CBC  Basename 03/07/11 0510 03/06/11 0615  WBC 18.5* 20.1*  HGB 8.1* 8.8*  HCT 26.0* 27.6*  PLT 360 291   BMET  Basename 03/07/11 0510 03/06/11 0615  NA 136 138  K 3.8 3.3*  CL 96 97  CO2 25 29  GLUCOSE 69* 87  BUN 30* 20  CREATININE 5.67* 4.37*  CALCIUM 9.7 9.5   LFT  Basename 03/07/11 0510  PROT 7.1  ALBUMIN 1.7*  AST 15  ALT 5  ALKPHOS 110  BILITOT 0.3  BILIDIR --  IBILI --  LIPASE --   PT/INR  Basename 03/07/11 0510  LABPROT 16.7*  INR 1.33   ABG No results found for this basename: PHART:2,PCO2:2,PO2:2,HCO3:2 in the last 72 hours  Studies/Results: Ct Abdomen Pelvis Wo Contrast  03/06/2011  *RADIOLOGY REPORT*  Clinical Data: Ischemic colitis.  Elevated white blood cell count. Rule out infectious process.  CT ABDOMEN AND PELVIS WITHOUT CONTRAST  Technique:  Multidetector CT imaging of the abdomen and pelvis was performed following the standard protocol without intravenous contrast.  Comparison: 02/14/2011  Findings: Left base collapse / consolidative change which is slightly improved since the prior CT.  Cardiomegaly with minimal bilateral pleural thickening.  Normal uninfused  appearance of the liver.  Slight decrease in right cardiophrenic angle nodal size, favored to be reactive.  7 mm maximum today.  Normal spleen, stomach.  Mild to moderately degraded exam, secondary lack of IV contrast and extensive beam hardening artifact from lumbar spine fixation.  The mild dilatation of the pancreatic duct within the head is less apparent today on image 30.  The gallbladder is partially contracted.  Prior wall thickening at 8 mm could be secondary.  Suboptimally evaluated.  Adrenal glands are poorly evaluated.  Surgical clips in the region of the right renal fossa. Numerous low density left renal lesions, likely cysts.  Age advanced aortic and branch vessel atherosclerosis.  Limited evaluation for retroperitoneal adenopathy.  The colon is decompressed.  Probable right hemicolectomy and long Hartmann's pouch.  Small bowel loops are incompletely opacified with enteric contrast. A right lower quadrant ileostomy is identified.  There is diffuse mesenteric and subcutaneous edema, which has improved since the prior exam.  The ill-defined fluid and gas within the right posterior abdomen and extending into the right flank is somewhat more well-circumscribed today.  Measures greater than fluid density, at between 27 and 33 HU.  Example pocket measures 4.8 x 6.9 cm on image 30 and 31 of series 3.  Is contiguous with the the gallbladder on image 27.  Extends to the cephalad aspect  of the right iliacus muscle.  No residual gas identified within.  Limited evaluation for pelvic sidewall adenopathy.  Normal urinary bladder.  Dystrophic and vascular calcifications within the uterus. Nonspecific presacral edema.  Interval removal of a rectal drain.  No acute osseous abnormality.  IMPRESSION:  1.  Moderately degraded exam, secondary to lack of IV contrast and presence of beam hardening artifact from lumbar sacral spine fixation. 2.  Somewhat more well-defined complex fluid collection in the right posterior abdomen  and abdominal wall/flank.  Although this could represent an evolving hematoma, infected hematoma or abscess cannot be excluded.  Sampling should be considered.  This collection is an contact with the inferior aspect of the gallbladder, which is contracted and has a thick wall.  A component of acute cholecystitis cannot be excluded.  Correlate with right upper quadrant symptoms and consider ultrasound. 3.  Improved left base aeration with atelectasis versus less likely infection remaining.  Original Report Authenticated By: Areta Haber, M.D.   Ct Guided Abscess Drain  03/08/2011  *RADIOLOGY REPORT*  Clinical Data: Recent right hemicolectomy, remote right nephrectomy, right retroperitoneal flank fluid collection concerning for infected hematoma.  CT FLUOROSCOPY GUIDED RIGHT LOWER QUADRANT ABSCESS DRAIN PLACEMENT  Date:  03/08/2011 08:50:00  Radiologist:  M. Daryll Brod, M.D.  Medications:  Versed and Fentanyl for conscious sedation  Guidance:  CT fluoroscopy  Fluoroscopy time:  6 seconds  Sedation time:  15 minutes  Contrast volume:  None.  Complications:  No immediate  PROCEDURE/FINDINGS:  Informed consent was obtained from the patient following explanation of the procedure, risks, benefits and alternatives. The patient understands, agrees and consents for the procedure. All questions were addressed.  A time out was performed.  Maximal barrier sterile technique utilized including caps, mask, sterile gowns, sterile gloves, large sterile drape, hand hygiene, and betadine  Previous imaging reviewed.  The patient was positioned left posterior oblique.  Noncontrast localization CT was performed.  The right lower quadrant heterogeneous fluid collection was localized. Utilizing CT fluoroscopy,  A 17 gauge 6.8 cm access needle was advanced into the collection.  Syringe aspiration yielded bloody exudative fluid consistent with an infected hematoma.  Guide wire advanced followed tract dilatation to insert a 12-French  drain. Drain catheter position confirmed in the collection with CT. Syringe aspiration yielded 20 ml additional bloody exudative fluid. Sample sent for gram and culture.  Catheter secured with a Prolene suture and connected to external suction bulb.  Sterile dressing applied.  No immediate complication.  The patient tolerated the procedure well.  IMPRESSION: Successful CT fluoroscopy guided right lower quadrant infected hematoma abscess drain (12-French drain).  Original Report Authenticated By: Jerilynn Mages. Daryll Brod, M.D.    Assessment: Principal Problem:  *Radiculopathy of lumbar region Posterior intra-abdominal abscess s/p perc drain  Procedure(s): COLONOSCOPY  Plan: Await cultures, WBC trend down, continue to monitor, likely rescan next week to reassess abscess.  LOS: 11 days    Federico Flake 03/08/2011

## 2011-03-08 NOTE — Progress Notes (Signed)
Physical Therapy Session Note  Patient Details  Name: [name] MRN: [id number] Date of Birth: 1951-01-27  Today's Date: 03/08/2011 Time: [id number] Time Calculation (min): 55 min  Precautions: Precautions Precautions: Back Precaution Booklet Issued: No Precaution Comments: Pt. recalled 3/3 back precautions with min cues. Donned back brace with Mod verbal cues Required Braces or Orthoses: Yes Spinal Brace: Lumbar corset Restrictions Weight Bearing Restrictions: No  Short Term Goals: PT Short Term Goal 1: Pt min A functional transfers PT Short Term Goal 1 - Progress: Met PT Short Term Goal 2: Pt min A gait 50' in controlled environment PT Short Term Goal 2 - Progress: Progressing toward goal PT Short Term Goal 3: Pt propell WC 150' in community Miod I with parts management PT Short Term Goal 3 - Progress: Progressing toward goal  Skilled Therapeutic Interventions/Progress Updates: AM tx missed due to pt have intestinal drain placed.  This PM, pt willing to do bedside tx, all in nearly flat bed,  despite pain 9/10 surgical site.  Pt is weak due to lack of intake and medical complications of last few days.  Pt performed therapeutic exercise for functional strengthening for transfers and gait.  10 x 1 each L and RLE: active- assistive straight leg raise, hip abduction/adduction, heel slides; resisted PF; 2 x 10 active- assistive short arc knee extension.  Pt attempted active assistive bil bridging, but this elicited spinal pain, and was discontinued.  At end of session, pt was positioned in partial L sidelying, heels off the bed surface, for pressure relief and comfort.       Pain Pain Assessment Pain Score:  (9/10 surgical drain site, R trunk; meds given during tx)       Therapy/Group: Individual Therapy  Archit Leger 03/08/2011, 4:20 PM

## 2011-03-08 NOTE — Procedures (Signed)
Successful CT guided RT flank infected hematoma drain (12 fr) No comp Stable 20cc pus aspirated GS and CX sent

## 2011-03-08 NOTE — Progress Notes (Signed)
Please see my full dictated progress note from earlier today.  I stopped by the patient's room and had a chance to talk with the patient again when she was more alert, and also to speak with her daughter, Elmyra Ricks. I went over the colonoscopic findings with then, and the need for anal sphincter evaluation prior to any possible future ileostomy takedown surgery.  At this time, I will plan to sign off. However, I would be happy to see the patient again at your request.  Cleotis Nipper, M.D. [phone]

## 2011-03-08 NOTE — Progress Notes (Signed)
Pt alert and oriented,  Pt went to radiology to have drainage tube placed,  Returned back to unit.  JP draining small amount of blooody drainage.  Ileostomy draining brown liquid stool. Daughter at bedside visiting. Pain controlled with current medication regimen Katharine Look

## 2011-03-08 NOTE — Progress Notes (Signed)
GASTROENTEROLOGY PROGRESS NOTE  Problem:   Ischemic colitis. Intra-abdominal fluid collection. Anal sphincter dysfunction.   Subjective: Feeling somewhat better today.  Objective: Currently afebrile, MAXIMUM TEMPERATURE 100.9, white count slightly better at 18.5. Her aortic material obtained from needle drainage of intra-abdominal fluid collection.  Biopsies from yesterday's colonoscopy showed chronic ischemic changes. These findings were reviewed with the patient  Assessment: 1. It appears that the patient's fever and leukocytosis are due to her intra-abdominal abscess. 2. The ischemic changes in her colonic remnant appeared to be less severe than when Dr. Amedeo Plenty colonoscoped her 2 weeks ago. They are not of sufficient severity to account for leukocytosis or fever, and probably not her abdominal pain, either. It is unclear whether the patient will have smoldering persistent ischemic changes, or she simply has ischemia that is slow to resolve. Either way, expectant management is appropriate. 3. I expect that the patient will continue to have occasional mucoid bloody discharge per rectum, as long as she has persistent ischemic changes in the colon. However, if the patient experiences a marked increase in the amount of bloody discharge she is experiencing, we would have to consider the possibility that her ischemia is getting worse and stent better, in which case updated colonoscopic evaluation would be appropriate. 4. It is recalled that the patient has, by physical exam, a very lax anal sphincter, which is consistent with her L5-S1 radiculopathy. This means that she will need careful assessment prior to consideration of ileostomy takedown, because of the risk of fecal incontinence. 5. The above findings were discussed with the patient's rehabilitation physician and staff. Plan: See above  Cleotis Nipper, M.D. 03/08/2011 12:25 PM

## 2011-03-08 NOTE — Progress Notes (Signed)
Occupational Therapy Note  Patient Details  Name: JOSHUA AMBRIZ MRN: [id number] Date of Birth: 10/20/1950 Today's Date: 03/08/2011  0730-0800  - 30  Minutes Patient missed 15 Minutes secondary to procedure off unit.  Individual Therapy Patient with complaints of pain throughout lower extremities. No rate give, RN aware. Patient found supine in bed. RN and doctor in room upon arrival. Patient with less confusion and stating she was willing to work with therapies. Engaged in perineal hygiene while in supine position and donned clean gown. With any rolling movement or touching right LE, patient cringed in pain. Encouraged patient to move lower extremities while in bed to help with stiffness and relieve some pain, patient agreed.  Positioned patient in bed for transport to procedure.   Savvas Roper 03/08/2011, 8:06 AM

## 2011-03-08 NOTE — Progress Notes (Signed)
Patient ID: AYRAM BELUE, female   DOB: [birth year], 60 y.o.   MRN: [id number]   S:Reports to be feeling "fair" and tolerated earlier hematoma drainage well.   O: BP 98/63  Pulse 111  Temp(Src) 98.4 F (36.9 C) (Oral)  Resp 23  Ht 5\' 5"  (1.651 m)  Wt 51.8 kg (114 lb 3.2 oz)  BMI 19.00 kg/m2  SpO2 98%  [id number] sleeping in bed, arouses easily to voice [id number] tachycardia, S1 and S2 with ESM Resp:CTA bilaterally (anterior) [id number], flat, tender over RUQ/LUQ Ext:NO LE edema      . darbepoetin (ARANESP) injection - DIALYSIS  200 mcg Intravenous Q Thu-HD  . ertapenem  500 mg Intravenous Q24H  . feeding supplement (NEPRO CARB STEADY)  237 mL Oral TID WC  . fentaNYL      . megestrol  200 mg Oral BID  . midazolam      . multivitamin  1 tablet Oral Daily  . oxyCODONE-acetaminophen  1 tablet Oral BID  . pantoprazole  40 mg Oral Q1200  . white petrolatum      . DISCONTD: sodium chloride   Intravenous Once  . DISCONTD: sodium chloride  250 mL Intravenous Once  . DISCONTD: darbepoetin (ARANESP) injection - DIALYSIS  200 mcg Intravenous Q Tue-HD    BMET  Lab 03/07/11 0510 03/06/11 0615 03/05/11 1849 03/02/11 1704  NA 136 138 134* 133*  K 3.8 3.3* 3.6 4.2  CL 96 97 94* 96  CO2 25 29 27 23   GLUCOSE 69* 87 102* 116*  BUN 30* 20 41* 21  CREATININE 5.67* 4.37* 7.18* 5.75*  ALB -- -- -- --  CALCIUM 9.7 9.5 9.6 9.6  PHOS -- -- 2.2* 2.0*   CBC  Lab 03/07/11 0510 03/06/11 0615 03/05/11 1848 03/04/11 0600  WBC 18.5* 20.1* 19.5* 19.2*  NEUTROABS -- -- -- --  HGB 8.1* 8.8* 8.7* 9.1*  HCT 26.0* 27.6* 27.8* 28.9*  MCV 87.2 87.6 88.3 89.5  PLT 360 291 393 338   Blood Culture    Component Value Date/Time   SDES BLOOD HEMODIALYSIS GRAFT 03/05/2011 1639   SPECREQUEST BOTTLES DRAWN AEROBIC AND ANAEROBIC 10CC 03/05/2011 1639   CULT        BLOOD CULTURE RECEIVED NO GROWTH TO DATE CULTURE WILL BE HELD FOR 5 DAYS BEFORE ISSUING A FINAL NEGATIVE REPORT 03/05/2011 1639   REPTSTATUS PENDING 03/05/2011 1639     Assessment/Plan:  1.Fever,Leukocytosis,Tachycardia, : CT abdomen/pelvis to evaluate from intra-abdominal source of infection revealed pocket of fluid which IR drained today after colonoscopy of Hartmans pouch yesterday. On ertapenem, will add anerobic coverage as culture results pending. Anticipate some improvement s/p drainage 2.ESRD: Plan for hemodialysis tomorrow, no acute needs identified at this time.  3. Anemia:Low Hgb, on max dose ESA, will check iron stores.  4. CKD-MBD: Low phosphorus, off binders  5. Nutrition: Limited by recent events, monitor intake and continue Nepro supplement  Makayla Confer K.

## 2011-03-09 ENCOUNTER — Inpatient Hospital Stay (HOSPITAL_COMMUNITY): Payer: Medicare Other

## 2011-03-09 LAB — RENAL FUNCTION PANEL
BUN: 31 mg/dL — ABNORMAL HIGH (ref 6–23)
CO2: 25 mEq/L (ref 19–32)
Calcium: 9.5 mg/dL (ref 8.4–10.5)
Chloride: 102 mEq/L (ref 96–112)
Creatinine, Ser: 5.28 mg/dL — ABNORMAL HIGH (ref 0.50–1.10)
GFR calc non Af Amer: 8 mL/min — ABNORMAL LOW (ref >90)

## 2011-03-09 LAB — CBC
MCHC: 31.2 g/dL (ref 30.0–36.0)
Platelets: 513 10*3/uL — ABNORMAL HIGH (ref 150–400)
RDW: 16.9 % — ABNORMAL HIGH (ref 11.5–15.5)

## 2011-03-09 LAB — GLUCOSE, CAPILLARY: Glucose-Capillary: 88 mg/dL (ref 70–99)

## 2011-03-09 MED ORDER — PROMETHAZINE HCL 25 MG/ML IJ SOLN
INTRAMUSCULAR | Status: AC
  Filled 2011-03-09: qty 1

## 2011-03-09 MED ORDER — SODIUM CHLORIDE 0.9 % IV SOLN
500.0000 mg | INTRAVENOUS | Status: DC
  Administered 2011-03-09 – 2011-03-10 (×2): 0.5 g via INTRAVENOUS
  Filled 2011-03-09 (×2): qty 0.5

## 2011-03-09 MED ORDER — SODIUM CHLORIDE 0.9 % IV SOLN
500.0000 mg | INTRAVENOUS | Status: DC
  Filled 2011-03-09: qty 0.5

## 2011-03-09 NOTE — Progress Notes (Signed)
Patient alert and oriented x3 - lethargic at times with short term memory deficits and mild confusion at times. Ileostomy care done and small liquid stool emptied. Serosanguinous drainage coming from JP drain on RLQ. Patient pain only managed minimally with percocet 5-325. Patient incision to midline approximated. Patient continues to complain of some pain in abdomen, and legs. Patient back from dialysis at approximately 1800. Patient irritable, mild confusion noted and received 1 percocet at 1845. Patient vitals stable. Flagyl antibiotic started. Continue with plan of care. Bed alarm and quick release for safety. Pt does not void r/t dialysis. Pt uses call bell appropriately at times.

## 2011-03-09 NOTE — Progress Notes (Signed)
Lab reported patient had gram - rods in peritoneal cultures from yesterday. Sensitivity to be completed tomorrow. Dr Alain Marion notified about 1245. No new orders received at this time. Patient on 2 types of antibiotics at this time.

## 2011-03-09 NOTE — Progress Notes (Signed)
2 Days Post-Op  Subjective: Pt ok. Denies abd pain. Back pain slightly better.  Objective: Vital signs in last 24 hours: Temp:  [99.1 F (37.3 C)] 99.1 F (37.3 C) (12/22 0500) Pulse Rate:  [108] 108  (12/22 0500) Resp:  [20] 20  (12/22 0500) BP: (121)/(78) 121/78 mmHg (12/22 0500) SpO2:  [98 %] 98 % (12/22 0500) Weight:  [117 lb 11.6 oz (53.4 kg)] 117 lb 11.6 oz (53.4 kg) (12/22 0500) Last BM Date: 03/08/11  Intake/Output this shift: Total I/O In: 120 [P.O.:120] Out: -   Physical Exam: BP 121/78  Pulse 108  Temp(Src) 99.1 F (37.3 C) (Oral)  Resp 20  Ht 5\' 5"  (1.651 m)  Wt 117 lb 11.6 oz (53.4 kg)  BMI 19.59 kg/m2  SpO2 98% Abdomen: soft, NT. Ostomy viable, working well [employer], cloudy bloody fluid output  Labs: CBC  Basename 03/07/11 0510  WBC 18.5*  HGB 8.1*  HCT 26.0*  PLT 360   BMET  Basename 03/07/11 0510  NA 136  K 3.8  CL 96  CO2 25  GLUCOSE 69*  BUN 30*  CREATININE 5.67*  CALCIUM 9.7   LFT  Basename 03/07/11 0510  PROT 7.1  ALBUMIN 1.7*  AST 15  ALT 5  ALKPHOS 110  BILITOT 0.3  BILIDIR --  IBILI --  LIPASE --   PT/INR  Basename 03/07/11 0510  LABPROT 16.7*  INR 1.33   ABG No results found for this basename: PHART:2,PCO2:2,PO2:2,HCO3:2 in the last 72 hours  Studies/Results: Ct Guided Abscess Drain  03/08/2011  *RADIOLOGY REPORT*  Clinical Data: Recent right hemicolectomy, remote right nephrectomy, right retroperitoneal flank fluid collection concerning for infected hematoma.  CT FLUOROSCOPY GUIDED RIGHT LOWER QUADRANT ABSCESS DRAIN PLACEMENT  Date:  03/08/2011 08:50:00  Radiologist:  M. Daryll Brod, M.D.  Medications:  Versed and Fentanyl for conscious sedation  Guidance:  CT fluoroscopy  Fluoroscopy time:  6 seconds  Sedation time:  15 minutes  Contrast volume:  None.  Complications:  No immediate  PROCEDURE/FINDINGS:  Informed consent was obtained from the patient following explanation of the procedure, risks, benefits  and alternatives. The patient understands, agrees and consents for the procedure. All questions were addressed.  A time out was performed.  Maximal barrier sterile technique utilized including caps, mask, sterile gowns, sterile gloves, large sterile drape, hand hygiene, and betadine  Previous imaging reviewed.  The patient was positioned left posterior oblique.  Noncontrast localization CT was performed.  The right lower quadrant heterogeneous fluid collection was localized. Utilizing CT fluoroscopy,  A 17 gauge 6.8 cm access needle was advanced into the collection.  Syringe aspiration yielded bloody exudative fluid consistent with an infected hematoma.  Guide wire advanced followed tract dilatation to insert a 12-French drain. Drain catheter position confirmed in the collection with CT. Syringe aspiration yielded 20 ml additional bloody exudative fluid. Sample sent for gram and culture.  Catheter secured with a Prolene suture and connected to external suction bulb.  Sterile dressing applied.  No immediate complication.  The patient tolerated the procedure well.  IMPRESSION: Successful CT fluoroscopy guided right lower quadrant infected hematoma abscess drain (12-French drain).  Original Report Authenticated By: Jerilynn Mages. Daryll Brod, M.D.    Assessment: Principal Problem:  *Radiculopathy of lumbar region   Procedure(s): COLONOSCOPY  Plan: Follow WBC and await cultures.  LOS: 12 days    Camp Gopal J 03/09/2011

## 2011-03-09 NOTE — Progress Notes (Signed)
Patient ID: [name], female   DOB: 04-22-1950, 60 y.o.   MRN: [id number] Patient ID: [name], female   DOB: [birth year], 60 y.o.   MRN: [id number] Subjective/Complaints: Review of Systems  Musculoskeletal: Positive for back pain.  All other systems reviewed and are negative.  States feet are sore- improved   Objective: Vital Signs: Blood pressure 121/78, pulse 108, temperature 99.1 F (37.3 C), temperature source Oral, resp. rate 20, height 5\' 5"  (1.651 m), weight 53.4 kg (117 lb 11.6 oz), SpO2 98.00%. Ct Guided Abscess Drain  03/08/2011  *RADIOLOGY REPORT*  Clinical Data: Recent right hemicolectomy, remote right nephrectomy, right retroperitoneal flank fluid collection concerning for infected hematoma.  CT FLUOROSCOPY GUIDED RIGHT LOWER QUADRANT ABSCESS DRAIN PLACEMENT  Date:  03/08/2011 08:50:00  Radiologist:  M. Daryll Brod, M.D.  Medications:  Versed and Fentanyl for conscious sedation  Guidance:  CT fluoroscopy  Fluoroscopy time:  6 seconds  Sedation time:  15 minutes  Contrast volume:  None.  Complications:  No immediate  PROCEDURE/FINDINGS:  Informed consent was obtained from the patient following explanation of the procedure, risks, benefits and alternatives. The patient understands, agrees and consents for the procedure. All questions were addressed.  A time out was performed.  Maximal barrier sterile technique utilized including caps, mask, sterile gowns, sterile gloves, large sterile drape, hand hygiene, and betadine  Previous imaging reviewed.  The patient was positioned left posterior oblique.  Noncontrast localization CT was performed.  The right lower quadrant heterogeneous fluid collection was localized. Utilizing CT fluoroscopy,  A 17 gauge 6.8 cm access needle was advanced into the collection.  Syringe aspiration yielded bloody exudative fluid consistent with an infected hematoma.  Guide wire advanced followed tract dilatation to insert a 12-French drain. Drain  catheter position confirmed in the collection with CT. Syringe aspiration yielded 20 ml additional bloody exudative fluid. Sample sent for gram and culture.  Catheter secured with a Prolene suture and connected to external suction bulb.  Sterile dressing applied.  No immediate complication.  The patient tolerated the procedure well.  IMPRESSION: Successful CT fluoroscopy guided right lower quadrant infected hematoma abscess drain (12-French drain).  Original Report Authenticated By: Jerilynn Mages. Daryll Brod, M.D.    Basename 03/07/11 0510  WBC 18.5*  HGB 8.1*  HCT 26.0*  PLT 360    Basename 03/07/11 0510  NA 136  K 3.8  CL 96  CO2 25  GLUCOSE 69*  BUN 30*  CREATININE 5.67*  CALCIUM 9.7   CBG (last 3)   Basename 03/07/11 1043  GLUCAP 73    Wt Readings from Last 3 Encounters:  03/09/11 53.4 kg (117 lb 11.6 oz)  03/09/11 53.4 kg (117 lb 11.6 oz)  02/24/11 56.4 kg (124 lb 5.4 oz)    Physical Exam:  General appearance: alert, cooperative and no distress Head: Normocephalic, without obvious abnormality, atraumatic Eyes: conjunctivae/corneas clear. PERRL, EOM's intact. Fundi benign. Ears: normal TM's and external ear canals both ears Nose: Nares normal. Septum midline. Mucosa normal. No drainage or sinus tenderness. Throat: lips, mucosa, and tongue normal; teeth and gums normal Neck: no adenopathy, no carotid bruit, no JVD, supple, symmetrical, trachea midline and thyroid not enlarged, symmetric, no tenderness/mass/nodules  R   IJ in place Back: symmetric, no curvature. ROM normal. No CVA tenderness. Resp: clear to auscultation bilaterally Cardio: regular rate and rhythm, S1, S2 normal, Gr 3/6 SEM  No  click, rub or gallop GI: soft, non-tender; bowel sounds normal; no masses,  no organomegaly  Extremities: extremities normal, atraumatic, no cyanosis or edema slight edema distally only; AV fistula R arm Pulses: 2+ and symmetric Skin: Skin color, texture, turgor normal. No rashes or  lesions Neurologic: continued foot drop tr/5 but pt denies sensory changes. Plantarflexion 3-4/5.  Generalized weakness in proximal legs 3/5.  Heel cords loose Incision/Wound: wounds all clean and intact.  Ostomy site sealed.  Saw no blood in stool today. Right thigh less tender, but no gross swelling.  Skin is warm throughout.  CBG (last 3)   Basename 03/07/11 1043  GLUCAP 73    Assessment/Plan: 1. Functional deficits secondary to lumbar stenosis with radiculopathy s/p decompression and ischemic colitis requiring colectomy which require 3+ hours per day of interdisciplinary therapy in a comprehensive inpatient rehab setting. Physiatrist is providing close team supervision and 24 hour management of active medical problems listed below. Physiatrist and rehab team continue to assess barriers to discharge/monitor patient progress toward functional and medical goals.  Up as tolerated today.  Mobility: Bed Mobility Bed Mobility: Yes Rolling Right: 4: Min assist;With rail Rolling Right Details (indicate cue type and reason): Cues to bend bil LEs and roll completely before pushign up to sit Left Sidelying to Sit: 3: Mod assist Left Sidelying to Sit Details (indicate cue type and reason): Increased effort and pain with coming up to sit'  Transfers Transfers: Yes Sit to Stand: 2: Max assist;With upper extremity assist;From chair/3-in-1 Stand to Sit: 2: Max assist;With upper extremity assist;To chair/3-in-1 Squat Pivot Transfers: 3: Mod assist Ambulation/Gait Ambulation/Gait Assistance: 4: Min assist Ambulation/Gait Assistance Details (indicate cue type and reason): A for LE lifting and placing due to bil LE foot drop, A for seaight shifting to unweight opposite LE, blocking knees Ambulation Distance (Feet): 8 Feet Assistive device: Rolling walker Gait Pattern: Decreased dorsiflexion - right;Decreased dorsiflexion - left;Right foot flat;Left foot flat;Right genu recurvatum;Left genu  recurvatum;Trunk flexed;Right hip hike Stairs: No (Unsafe t this time) Architect: Yes Wheelchair Assistance: 4: Min Lexicographer: Both upper extremities Wheelchair Parts Management: Supervision/cueing (Cues for maintaining back precautions with parts management) Distance: 150 ADL:   Cognition: Cognition Overall Cognitive Status: Appears within functional limits for tasks assessed Arousal/Alertness: Lethargic Orientation Level: Oriented to person;Oriented to place Memory: Impaired (forgot date after 1 minute) Awareness: Appears intact Problem Solving: Appears intact Safety/Judgment: Appears intact Cognition Arousal/Alertness: Lethargic Orientation Level: Oriented to person;Oriented to place  1.Lumbar stenosis with radiculopathy-S/P laminectomy Lumbar 4-5 and L 5 S1 11/14.Back corset when out of bed.Routine back precautions. Consider b/l AFO's for gait, but with edema presently, i'm not anxious to order. Edema control.  2. DVT Prophylaxis/Anticoagulation: SCD,s/ and support hose. Dopplers negative  3. Pain Management: percocet.prn--- neurontin stopped.  4.Ischemic Colitis-S/P exp lap with right hemicolectomy and ileostomy 11/24.Provide ileostomy care and education. She is doing some ostomy care already on her own. WBC's down to 18K now.  Colonoscopy unremarkable.  Cont IV Abx,CT guided fluid aspiration today Will likely miss therapy today.             5.ESRD-dialysis as directed per renal services.Weigh patient daily.Follow up labs with dialysis.    6Chronic anemia-aranesp with HD.Follow up CBC with HD and transfuse if needed.   7.HTN-No present meds and monitor with increased activity.   8. AMS:  Medically related. See above.  neurontin stopped. Limit narcotics as possible.  Seems a little more alert today. Oriented x 3 this am.  Meds from colonoscopy cleared but will have sedation for CT guided bx  today  Will need new central line if  IV abx are continued beyond 7d     LOS (Days) 12 A FACE TO FACE EVALUATION WAS PERFORMED  Nyoka Cowden 03/09/2011, 9:25 AM

## 2011-03-09 NOTE — Progress Notes (Addendum)
Patient ID: [name], female   DOB: 1950/12/10, 60 y.o.   MRN: [id number]  S: No new complaints.   O: BP 127/92  Pulse 108  Temp(Src) 97.6 F (36.4 C) (Oral)  Resp 20  Ht 5\' 5"  (1.651 m)  Wt 52 kg (114 lb 10.2 oz)  BMI 19.08 kg/m2  SpO2 95%  [id number] sleeping in bed, on dialysis [id number] tachycardia, S1 and S2 with SEM Resp:CTA bilaterally (anterior) [id number], flat, tender over RUQ/LUQ.  Right abd drain in place with JP bulb Ext:NO LE edema      . darbepoetin (ARANESP) injection - DIALYSIS  200 mcg Intravenous Q Thu-HD  . ertapenem  500 mg Intravenous Q24H  . feeding supplement (NEPRO CARB STEADY)  237 mL Oral TID WC  . fentaNYL      . megestrol  200 mg Oral BID  . metronidazole  500 mg Intravenous Q8H  . midazolam      . multivitamin  1 tablet Oral Daily  . oxyCODONE-acetaminophen  1 tablet Oral BID  . pantoprazole  40 mg Oral Q1200  . promethazine        BMET  Lab 03/07/11 0510 03/06/11 0615 03/05/11 1849 03/02/11 1704  NA 136 138 134* 133*  K 3.8 3.3* 3.6 4.2  CL 96 97 94* 96  CO2 25 29 27 23   GLUCOSE 69* 87 102* 116*  BUN 30* 20 41* 21  CREATININE 5.67* 4.37* 7.18* 5.75*  ALB -- -- -- --  CALCIUM 9.7 9.5 9.6 9.6  PHOS -- -- 2.2* 2.0*   CBC  Lab 03/07/11 0510 03/06/11 0615 03/05/11 1848 03/04/11 0600  WBC 18.5* 20.1* 19.5* 19.2*  NEUTROABS -- -- -- --  HGB 8.1* 8.8* 8.7* 9.1*  HCT 26.0* 27.6* 27.8* 28.9*  MCV 87.2 87.6 88.3 89.5  PLT 360 291 393 338   Blood Culture    Component Value Date/Time   SDES PERITONEAL 03/08/2011 0947   SPECREQUEST PERITONEAL CAVITY FLUID 03/08/2011 0947   CULT  Value: MODERATE GRAM NEGATIVE RODS Note: CRITICAL RESULT CALLED TO, READ BACK BY AND VERIFIED WITH: RN A. ROYAL ON 03/09/11 BY TEDAR 03/08/2011 0947   REPTSTATUS PENDING 03/08/2011 0947     Assessment/Plan:  1. ESRD: dialysis today (TTS), minimal UF, below dry weight 2. Abdominal abcess: S/P perc drain, on IV abx with ertapenem 3. Anemia:Low  Hgb, on max dose ESA - check iron stores.  4. CKD-MBD: Low phosphorus, off binders  5. Nutrition: continue Nepro supplement  Kelly Splinter, MD [facility] [phone] pager   [phone] cell 03/09/2011, 4:17 PM

## 2011-03-09 NOTE — Progress Notes (Signed)
2 Days Post-Op  Subjective: No complaints  Objective: Vital signs in last 24 hours: Temp:  [99.1 F (37.3 C)] 99.1 F (37.3 C) (12/22 0500) Pulse Rate:  [108] 108  (12/22 0500) Resp:  [20] 20  (12/22 0500) BP: (121)/(78) 121/78 mmHg (12/22 0500) SpO2:  [98 %] 98 % (12/22 0500) Weight:  [117 lb 11.6 oz (53.4 kg)] 117 lb 11.6 oz (53.4 kg) (12/22 0500) Last BM Date: 03/08/11  Intake/Output from previous day: 12/21 0701 - 12/22 0700 In: 240 [P.O.:240] Out: 26 [Drains:25; Stool:1] Intake/Output this shift: Total I/O In: 120 [P.O.:120] Out: -   Exam: Right flank drain site clean and without signs of infection. About 20 ccs bloody drainage in bulb. 25ccs output charted yesterday  Lab Results:   Basename 03/07/11 0510  WBC 18.5*  HGB 8.1*  HCT 26.0*  PLT 360   BMET  Basename 03/07/11 0510  NA 136  K 3.8  CL 96  CO2 25  GLUCOSE 69*  BUN 30*  CREATININE 5.67*  CALCIUM 9.7   PT/INR  Basename 03/07/11 0510  LABPROT 16.7*  INR 1.33   ABG No results found for this basename: PHART:2,PCO2:2,PO2:2,HCO3:2 in the last 72 hours  Studies/Results: Ct Guided Abscess Drain  03/08/2011  *RADIOLOGY REPORT*  Clinical Data: Recent right hemicolectomy, remote right nephrectomy, right retroperitoneal flank fluid collection concerning for infected hematoma.  CT FLUOROSCOPY GUIDED RIGHT LOWER QUADRANT ABSCESS DRAIN PLACEMENT  Date:  03/08/2011 08:50:00  Radiologist:  M. Daryll Brod, M.D.  Medications:  Versed and Fentanyl for conscious sedation  Guidance:  CT fluoroscopy  Fluoroscopy time:  6 seconds  Sedation time:  15 minutes  Contrast volume:  None.  Complications:  No immediate  PROCEDURE/FINDINGS:  Informed consent was obtained from the patient following explanation of the procedure, risks, benefits and alternatives. The patient understands, agrees and consents for the procedure. All questions were addressed.  A time out was performed.  Maximal barrier sterile technique utilized  including caps, mask, sterile gowns, sterile gloves, large sterile drape, hand hygiene, and betadine  Previous imaging reviewed.  The patient was positioned left posterior oblique.  Noncontrast localization CT was performed.  The right lower quadrant heterogeneous fluid collection was localized. Utilizing CT fluoroscopy,  A 17 gauge 6.8 cm access needle was advanced into the collection.  Syringe aspiration yielded bloody exudative fluid consistent with an infected hematoma.  Guide wire advanced followed tract dilatation to insert a 12-French drain. Drain catheter position confirmed in the collection with CT. Syringe aspiration yielded 20 ml additional bloody exudative fluid. Sample sent for gram and culture.  Catheter secured with a Prolene suture and connected to external suction bulb.  Sterile dressing applied.  No immediate complication.  The patient tolerated the procedure well.  IMPRESSION: Successful CT fluoroscopy guided right lower quadrant infected hematoma abscess drain (12-French drain).  Original Report Authenticated By: Jerilynn Mages. Daryll Brod, M.D.    Anti-infectives: Anti-infectives     Start     Dose/Rate Route Frequency Ordered Stop   03/08/11 1100   metroNIDAZOLE (FLAGYL) IVPB 500 mg        500 mg 100 mL/hr over 60 Minutes Intravenous Every 8 hours 03/08/11 1036     03/03/11 1200   ertapenem (INVANZ) 0.5 g in sodium chloride 0.9 % 50 mL IVPB        500 mg 100 mL/hr over 30 Minutes Intravenous Every 24 hours 03/03/11 1108     03/03/11 1045   ertapenem (INVANZ) 1 g in sodium  chloride 0.9 % 50 mL IVPB  Status:  Discontinued        1 g 100 mL/hr over 30 Minutes Intravenous Every 24 hours 03/03/11 1043 03/03/11 1107          Assessment/Plan: S/P Right flank abdominal abscess drain placed 12/21 Continue to follow.   LOS: 12 days    Zarian Colpitts 03/09/2011

## 2011-03-10 LAB — RENAL FUNCTION PANEL
Albumin: 1.8 g/dL — ABNORMAL LOW (ref 3.5–5.2)
BUN: 19 mg/dL (ref 6–23)
GFR calc non Af Amer: 15 mL/min — ABNORMAL LOW (ref >90)
Phosphorus: 1.8 mg/dL — ABNORMAL LOW (ref 2.3–4.6)
Potassium: 4.2 mEq/L (ref 3.5–5.1)
Sodium: 140 mEq/L (ref 135–145)

## 2011-03-10 LAB — BODY FLUID CULTURE

## 2011-03-10 LAB — CBC
Hemoglobin: 7.9 g/dL — ABNORMAL LOW (ref 12.0–15.0)
MCH: 27 pg (ref 26.0–34.0)
MCHC: 30.7 g/dL (ref 30.0–36.0)

## 2011-03-10 MED ORDER — OXYCODONE-ACETAMINOPHEN 5-325 MG PO TABS
1.0000 | ORAL_TABLET | ORAL | Status: DC | PRN
  Administered 2011-03-10 – 2011-03-11 (×3): 1 via ORAL
  Administered 2011-03-11 (×3): 2 via ORAL
  Administered 2011-03-12: 1 via ORAL
  Administered 2011-03-12: 2 via ORAL
  Administered 2011-03-12: 1 via ORAL
  Administered 2011-03-12 – 2011-03-14 (×3): 2 via ORAL
  Administered 2011-03-15: 1 via ORAL
  Administered 2011-03-15 (×2): 2 via ORAL
  Administered 2011-03-15: 1 via ORAL
  Administered 2011-03-16 – 2011-03-18 (×10): 2 via ORAL
  Administered 2011-03-19 – 2011-03-21 (×9): 1 via ORAL
  Filled 2011-03-10 (×2): qty 2
  Filled 2011-03-10: qty 1
  Filled 2011-03-10: qty 2
  Filled 2011-03-10: qty 1
  Filled 2011-03-10: qty 2
  Filled 2011-03-10 (×3): qty 1
  Filled 2011-03-10: qty 2
  Filled 2011-03-10 (×2): qty 1
  Filled 2011-03-10 (×6): qty 2
  Filled 2011-03-10 (×2): qty 1
  Filled 2011-03-10: qty 2
  Filled 2011-03-10 (×3): qty 1
  Filled 2011-03-10: qty 2
  Filled 2011-03-10: qty 1
  Filled 2011-03-10: qty 2
  Filled 2011-03-10: qty 1
  Filled 2011-03-10 (×4): qty 2

## 2011-03-10 NOTE — Progress Notes (Signed)
Physical Therapy Note  Patient Details  Name: [name] MRN: [id number] Date of Birth: January 21, 1951 Today's Date: 03/10/2011  Time In: 0930  Time Out:  1015. Individual therapy.  Patient states she has "pain a lot" but unable to rate and unable to identify where.  Treatment session focusing on ADL retraining at sink level with emphasis on activity tolerance, sustained attention, basic sequencing and organization, basic verbal processing, sitting balance, and following one step directions.  Patient confused this am and unable to tolerate full hour secondary to physical and mental fatigue.  Patient did not have brace on when this therapist entered the room.  Difficult for patient to wear brace secondary to three surgeries, colostomy bag and now drain.  Per MD verbal instruction, patient may go without brace today.  RN aware.   Quay Burow 03/10/2011, 10:07 AM

## 2011-03-10 NOTE — Progress Notes (Signed)
Subjective:  No new complaints  Objective: Vital signs in last 24 hours: Temp:  [96.8 F (36 C)-98.5 F (36.9 C)] 98.5 F (36.9 C) (12/23 0500) Pulse Rate:  [101-122] 101  (12/23 0500) Resp:  [18-20] 18  (12/23 0500) BP: (84-133)/(61-92) 110/74 mmHg (12/23 0500) SpO2:  [95 %-100 %] 99 % (12/23 0500) Weight:  [49.3 kg (108 lb 11 oz)-52 kg (114 lb 10.2 oz)] 108 lb 11 oz (49.3 kg) (12/23 0500) Weight change: -1.4 kg (-3 lb 1.4 oz)  Intake/Output from previous day: 12/22 0701 - 12/23 0700 In: 380 [P.O.:360] Out: 1500 [Stool:500] Intake/Output this shift: Total I/O In: 120 [P.O.:120] Out: -  EXAM: General appearance:  Alert, comfortable, in no apparent distress Resp:  CTA B (anterior only) Cardio:  Tachycardic with Gr II/IV systolic ejection murmur GI:  Soft with upper abdominal tenderness, drain @ right side  Extremities:  No edema Access:  AVF @ RUA  Lab Results:  Basename 03/10/11 0500 03/09/11 1359  WBC 11.3* 13.0*  HGB 7.9* 8.1*  HCT 25.7* 26.0*  PLT 428* 513*   BMET:  Basename 03/10/11 0500 03/09/11 1358  NA 140 141  K 4.2 4.0  CL 102 102  CO2 30 25  GLUCOSE 109* 113*  BUN 19 31*  CREATININE 3.13* 5.28*  CALCIUM 9.1 9.5  ALBUMIN 1.8* 1.8*   No results found for this basename: PTH:2 in the last 72 hours Iron Studies: No results found for this basename: IRON,TIBC,TRANSFERRIN,FERRITIN in the last 72 hours  Assessment/Plan: 1.  ESRD - on HD on TTS @ [place], stable s/p HD yesterday, K stable @ 4.2, no signs or symptoms of fluid overload.  Next HD tomorrow per holiday schedule. 2.  Abdominal abcess - Right flank drain placed 12/21, body fluid culture with E. Coli, on IV Ertapenem.   3.  Anemia - Hgb low at 7.9, on Aranesp 200 mcg on Thurs.  Fe studies pending. 4.  Secondary hyperparathyroidism - Off binders, low P. 5.  Nutrition - on Nepro.     LOS: 13 days   [name],CHARLES 03/10/2011,12:51 PM  Patient seen and examined and agree with A/P as above.  E coli  growing from abcess fluid, on IV ertapenem, which provides good anaerobic coverage as well, may be able to d/c flagyl.  Fevers better.  Rec's as above.   Kelly Splinter, MD [employer] [phone] pager   [phone] cell 03/10/2011, 4:32 PM

## 2011-03-10 NOTE — Progress Notes (Signed)
Emptied 10cc's of bloody drainage from JP drain in past 12 hours. Recharged drain every 4 hours. Ileostomy emptied by staff of liquid stool. No voids this shift. RUE AVG graft site.  Requesting pain med before med due. 2 percocet given at [place]. C/O "sharp" pain to bilateral great toes. Coletta Memos

## 2011-03-10 NOTE — Progress Notes (Signed)
Patient ID: TASMIN ZAHRADKA, female   DOB: 03/30/1950, 60 y.o.   MRN: [id number] Patient ID: NIARA STAHMER, female   DOB: 04-Aug-1950, 60 y.o.   MRN: [id number] Patient ID: CASELYN BUTT, female   DOB: 22-Dec-1950, 60 y.o.   MRN: [id number] Subjective/Complaints: Review of Systems  Musculoskeletal: Positive for back pain.  All other systems reviewed and are negative.  States feet are sore- improved;  remains on parenteral antibiotics;  gram-negative rods cultured from peritoneal fluid   Objective: Vital Signs: Blood pressure 110/74, pulse 101, temperature 98.5 F (36.9 C), temperature source Oral, resp. rate 18, height 5\' 5"  (1.651 m), weight 49.3 kg (108 lb 11 oz), SpO2 99.00%. Ct Guided Abscess Drain  03/08/2011  *RADIOLOGY REPORT*  Clinical Data: Recent right hemicolectomy, remote right nephrectomy, right retroperitoneal flank fluid collection concerning for infected hematoma.  CT FLUOROSCOPY GUIDED RIGHT LOWER QUADRANT ABSCESS DRAIN PLACEMENT  Date:  03/08/2011 08:50:00  Radiologist:  M. Daryll Brod, M.D.  Medications:  Versed and Fentanyl for conscious sedation  Guidance:  CT fluoroscopy  Fluoroscopy time:  6 seconds  Sedation time:  15 minutes  Contrast volume:  None.  Complications:  No immediate  PROCEDURE/FINDINGS:  Informed consent was obtained from the patient following explanation of the procedure, risks, benefits and alternatives. The patient understands, agrees and consents for the procedure. All questions were addressed.  A time out was performed.  Maximal barrier sterile technique utilized including caps, mask, sterile gowns, sterile gloves, large sterile drape, hand hygiene, and betadine  Previous imaging reviewed.  The patient was positioned left posterior oblique.  Noncontrast localization CT was performed.  The right lower quadrant heterogeneous fluid collection was localized. Utilizing CT fluoroscopy,  A 17 gauge 6.8 cm access needle was advanced into the collection.  Syringe  aspiration yielded bloody exudative fluid consistent with an infected hematoma.  Guide wire advanced followed tract dilatation to insert a 12-French drain. Drain catheter position confirmed in the collection with CT. Syringe aspiration yielded 20 ml additional bloody exudative fluid. Sample sent for gram and culture.  Catheter secured with a Prolene suture and connected to external suction bulb.  Sterile dressing applied.  No immediate complication.  The patient tolerated the procedure well.  IMPRESSION: Successful CT fluoroscopy guided right lower quadrant infected hematoma abscess drain (12-French drain).  Original Report Authenticated By: Jerilynn Mages. Daryll Brod, M.D.    Basename 03/10/11 0500 03/09/11 1359  WBC 11.3* 13.0*  HGB 7.9* 8.1*  HCT 25.7* 26.0*  PLT 428* 513*    Basename 03/10/11 0500 03/09/11 1358  NA 140 141  K 4.2 4.0  CL 102 102  CO2 30 25  GLUCOSE 109* 113*  BUN 19 31*  CREATININE 3.13* 5.28*  CALCIUM 9.1 9.5   CBG (last 3)   Basename 03/09/11 1809 03/07/11 1043  GLUCAP 88 73    Wt Readings from Last 3 Encounters:  03/10/11 49.3 kg (108 lb 11 oz)  03/10/11 49.3 kg (108 lb 11 oz)  02/24/11 56.4 kg (124 lb 5.4 oz)    Physical Exam:  General appearance: alert, cooperative and no distress Head: Normocephalic, without obvious abnormality, atraumatic Eyes: conjunctivae/corneas clear. PERRL, EOM's intact. Fundi benign. Ears: normal TM's and external ear canals both ears Nose: Nares normal. Septum midline. Mucosa normal. No drainage or sinus tenderness. Throat: lips, mucosa, and tongue normal; teeth and gums normal Neck: no adenopathy, no carotid bruit, no JVD, supple, symmetrical, trachea midline and thyroid not enlarged, symmetric, no tenderness/mass/nodules  R  IJ in place Back: symmetric, no curvature. ROM normal. No CVA tenderness. Resp: clear to auscultation bilaterally Cardio: regular rate and rhythm, S1, S2 normal, Gr 3/6 SEM  No  click, rub or gallop; rate  90-100 GI: soft, non-tender; bowel sounds normal; no masses,  no organomegaly;  colostomy noted Extremities: extremities normal, atraumatic, no cyanosis or edema slight edema distally only; AV fistula R arm Pulses: 2+ and symmetric Skin: Skin color, texture, turgor normal. No rashes or lesions Neurologic: continued foot drop tr/5 but pt denies sensory changes. Plantarflexion 3-4/5.  Generalized weakness in proximal legs 3/5.  Heel cords loose Incision/Wound: wounds all clean and intact.  Ostomy site sealed.  Saw no blood in stool today. Right thigh less tender, but no gross swelling.  Skin is warm throughout.  CBG (last 3)   Basename 03/09/11 1809 03/07/11 1043  GLUCAP 88 73    Assessment/Plan: 1. Functional deficits secondary to lumbar stenosis with radiculopathy s/p decompression and ischemic colitis requiring colectomy which require 3+ hours per day of interdisciplinary therapy in a comprehensive inpatient rehab setting. Physiatrist is providing close team supervision and 24 hour management of active medical problems listed below. Physiatrist and rehab team continue to assess barriers to discharge/monitor patient progress toward functional and medical goals.  Up as tolerated today.  Mobility: Bed Mobility Bed Mobility: Yes Rolling Right: 4: Min assist;With rail Rolling Right Details (indicate cue type and reason): Cues to bend bil LEs and roll completely before pushign up to sit Left Sidelying to Sit: 3: Mod assist Left Sidelying to Sit Details (indicate cue type and reason): Increased effort and pain with coming up to sit'  Transfers Transfers: Yes Sit to Stand: 2: Max assist;With upper extremity assist;From chair/3-in-1 Stand to Sit: 2: Max assist;With upper extremity assist;To chair/3-in-1 Squat Pivot Transfers: 3: Mod assist Ambulation/Gait Ambulation/Gait Assistance: 4: Min assist Ambulation/Gait Assistance Details (indicate cue type and reason): A for LE lifting and  placing due to bil LE foot drop, A for seaight shifting to unweight opposite LE, blocking knees Ambulation Distance (Feet): 8 Feet Assistive device: Rolling walker Gait Pattern: Decreased dorsiflexion - right;Decreased dorsiflexion - left;Right foot flat;Left foot flat;Right genu recurvatum;Left genu recurvatum;Trunk flexed;Right hip hike Stairs: No (Unsafe t this time) Architect: Yes Wheelchair Assistance: 4: Min Lexicographer: Both upper extremities Wheelchair Parts Management: Supervision/cueing (Cues for maintaining back precautions with parts management) Distance: 150 ADL:   Cognition: Cognition Overall Cognitive Status: Appears within functional limits for tasks assessed Arousal/Alertness: Lethargic Orientation Level: Oriented to person;Oriented to place (memory deficits) Memory: Impaired (forgot date after 1 minute) Awareness: Appears intact Problem Solving: Appears intact Safety/Judgment: Appears intact Cognition Arousal/Alertness: Lethargic Orientation Level: Oriented to person;Oriented to place (memory deficits)  1.Lumbar stenosis with radiculopathy-S/P laminectomy Lumbar 4-5 and L 5 S1 11/14.Back corset when out of bed.Routine back precautions. Consider b/l AFO's for gait, but with edema presently, i'm not anxious to order. Edema control.  2. DVT Prophylaxis/Anticoagulation: SCD,s/ and support hose. Dopplers negative  3. Pain Management: percocet.prn--- neurontin stopped.  4.Ischemic Colitis-S/P exp lap with right hemicolectomy and ileostomy 11/24.Provide ileostomy care and education. She is doing some ostomy care already on her own. WBC's down to 11.3K  now.  Colonoscopy unremarkable.  Cont IV Abx,CT guided fluid aspiration today               5.ESRD-dialysis as directed per renal services.Weigh patient daily.Follow up labs with dialysis.    6Chronic anemia-aranesp with HD.Follow up CBC with  HD and transfuse if needed.     7.HTN-No present meds and monitor with increased activity.   8. AMS:  Medically related. See above.  neurontin stopped. Limit narcotics as possible.  Seems a little more alert today. Oriented x 3 this am.  Meds from colonoscopy cleared but will have sedation for CT guided bx today  Will need new central line if IV abx are continued beyond 7d     LOS (Days) 13 A FACE TO FACE EVALUATION WAS PERFORMED  Nyoka Cowden 03/10/2011, 8:20 AM

## 2011-03-10 NOTE — Progress Notes (Signed)
Patient alert and oriented x3 - lethargic at times with short term memory deficits and mild confusion at times. Ileostomy care done and small liquid stool emptied. Serosanguinous drainage coming from JP drain on RLQ. Patient pain only managed minimally with percocet 5-325. Patient incision to midline approximated. Patient continues to complain of some pain in abdomen, and legs. Pt given scheduled percocet at 1200 and additional at 1607 with some relief. Continuing to use bed alarm and quick release for safety. Continue with plan of care.

## 2011-03-10 NOTE — Progress Notes (Signed)
3 Days Post-Op  Subjective: No complaints. Objective: Vital signs in last 24 hours: Temp:  [96.8 F (36 C)-98.5 F (36.9 C)] 98.5 F (36.9 C) (12/23 0500) Pulse Rate:  [101-122] 101  (12/23 0500) Resp:  [18-20] 18  (12/23 0500) BP: (84-133)/(61-92) 110/74 mmHg (12/23 0500) SpO2:  [95 %-100 %] 99 % (12/23 0500) Weight:  [108 lb 11 oz (49.3 kg)-114 lb 10.2 oz (52 kg)] 108 lb 11 oz (49.3 kg) (12/23 0500) Last BM Date: 03/09/11  Intake/Output from previous day: 12/22 0701 - 12/23 0700 In: 380 [P.O.:360] Out: 1500 [Stool:500] Intake/Output this shift:    Exam : Right flank drain site clean. Dressing soiled. Approx 10 ccs bloody fluid in bulb. 25 ccs out the day before. No amount charted for yesterday or today.  Lab Results:   Basename 03/10/11 0500 03/09/11 1359  WBC 11.3* 13.0*  HGB 7.9* 8.1*  HCT 25.7* 26.0*  PLT 428* 513*   BMET  Basename 03/10/11 0500 03/09/11 1358  NA 140 141  K 4.2 4.0  CL 102 102  CO2 30 25  GLUCOSE 109* 113*  BUN 19 31*  CREATININE 3.13* 5.28*  CALCIUM 9.1 9.5   PT/INR No results found for this basename: LABPROT:2,INR:2 in the last 72 hours ABG No results found for this basename: PHART:2,PCO2:2,PO2:2,HCO3:2 in the last 72 hours  Studies/Results: No results found.  Anti-infectives: Anti-infectives     Start     Dose/Rate Route Frequency Ordered Stop   03/09/11 2000   ertapenem (INVANZ) 0.5 g in sodium chloride 0.9 % 50 mL IVPB        500 mg 100 mL/hr over 30 Minutes Intravenous Every 24 hours 03/09/11 1702     03/09/11 1800   ertapenem (INVANZ) 0.5 g in sodium chloride 0.9 % 50 mL IVPB  Status:  Discontinued        500 mg 100 mL/hr over 30 Minutes Intravenous Every 24 hours 03/09/11 1658 03/09/11 1702   03/08/11 1100   metroNIDAZOLE (FLAGYL) IVPB 500 mg        500 mg 100 mL/hr over 60 Minutes Intravenous Every 8 hours 03/08/11 1036     03/03/11 1200   ertapenem (INVANZ) 0.5 g in sodium chloride 0.9 % 50 mL IVPB  Status:   Discontinued        500 mg 100 mL/hr over 30 Minutes Intravenous Every 24 hours 03/03/11 1108 03/09/11 1658   03/03/11 1045   ertapenem (INVANZ) 1 g in sodium chloride 0.9 % 50 mL IVPB  Status:  Discontinued        1 g 100 mL/hr over 30 Minutes Intravenous Every 24 hours 03/03/11 1043 03/03/11 1107          Assessment/Plan: S/P right flank hematoma drain placed 12/21. Continue to follow. Body fluid culture - E  Coli - pt on Flagyl and Invanz  LOS: 13 days    Helaine Yackel 03/10/2011

## 2011-03-11 LAB — CULTURE, BLOOD (ROUTINE X 2)
Culture  Setup Time: 201212182344
Culture: NO GROWTH

## 2011-03-11 LAB — CBC
Platelets: 453 10*3/uL — ABNORMAL HIGH (ref 150–400)
RBC: 3.09 MIL/uL — ABNORMAL LOW (ref 3.87–5.11)
WBC: 13.1 10*3/uL — ABNORMAL HIGH (ref 4.0–10.5)

## 2011-03-11 LAB — RENAL FUNCTION PANEL
BUN: 35 mg/dL — ABNORMAL HIGH (ref 6–23)
Calcium: 9.4 mg/dL (ref 8.4–10.5)
GFR calc Af Amer: 11 mL/min — ABNORMAL LOW (ref >90)
Glucose, Bld: 122 mg/dL — ABNORMAL HIGH (ref 70–99)
Phosphorus: 2.3 mg/dL (ref 2.3–4.6)
Sodium: 138 mEq/L (ref 135–145)

## 2011-03-11 LAB — IRON AND TIBC: UIBC: 80 ug/dL — ABNORMAL LOW (ref 125–400)

## 2011-03-11 LAB — FERRITIN: Ferritin: 2041 ng/mL — ABNORMAL HIGH (ref 10–291)

## 2011-03-11 MED ORDER — CIPROFLOXACIN HCL 500 MG PO TABS
500.0000 mg | ORAL_TABLET | Freq: Every day | ORAL | Status: DC
  Administered 2011-03-11 – 2011-03-21 (×11): 500 mg via ORAL
  Filled 2011-03-11 (×13): qty 1

## 2011-03-11 MED ORDER — CIPROFLOXACIN HCL 500 MG PO TABS: 500.0000 mg | ORAL_TABLET | Freq: Two times a day (BID) | ORAL | Status: DC

## 2011-03-11 MED ORDER — ALTEPLASE 2 MG IJ SOLR
2.0000 mg | Freq: Once | INTRAMUSCULAR | Status: AC
  Administered 2011-03-11: 2 mg
  Filled 2011-03-11: qty 2

## 2011-03-11 MED ORDER — GABAPENTIN 100 MG PO CAPS
100.0000 mg | ORAL_CAPSULE | Freq: Two times a day (BID) | ORAL | Status: DC
  Administered 2011-03-11 – 2011-03-12 (×4): 100 mg via ORAL
  Filled 2011-03-11 (×9): qty 1

## 2011-03-11 NOTE — Progress Notes (Addendum)
Occupational Therapy Weekly Progress Note & Session Note  Patient Details  Name: YARELYN HAITH MRN: [id number] Date of Birth: 1950/06/14  Today's Date: 03/11/2011  Patient is progressing toward all short term and long term goals. Patient met 2/3 set short term goals at this time. Patient didn't meet her min assist transfer goal secondary to inconsistencies with transfers. Over the past week, patient has been on/off rehab floor for various procedures related to her bowels. Patient seems confused during therapies and has refused some therapies secondary to her complaints of pain and statements; "I can't do it right now." Patient also with signs of confusing throughout therapies. RN and doctor aware.  Added Addendum: To be determined when patient is medically stable to discharge home. Need to complete family education with daughter.   Patient continues to demonstrate the following deficits: decreased activity tolerance/endurance, decreased independence with ADLs & IADLs, decreased independence & inconsistencies with transfers, and confusion. Therefore, patient will continue to benefit from skilled OT intervention to enhance overall performance with ADLs.  Patient progressing toward long term goals..  Continue plan of care.  OT Short Term Goals OT Short Term Goal 1: Patient will perform transfers at min assist level using adaptive equipment prn OT Short Term Goal 1 - Progress: Progressing toward goal OT Short Term Goal 2: Patient will be able to verbalize and adhere to 3/3 back preacautions independently  OT Short Term Goal 2 - Progress: Met OT Short Term Goal 3: Patient will donn back brace with set-up assist OT Short Term Goal 3 - Progress: Met OT Short Term Goal 4: ADL short term goals = ADL long term goals OT Short Term Goal 4 - Progress: Progressing toward goal  Therapy Documentation  [phone] - 8 Minutes Individual Therapy Patient complained of 8/10 pain in right lower extremity.  "whole leg, all the way down to the big toe". RN aware  Precautions: Precautions Precautions: Back Precaution Booklet Issued: No Precaution Comments: 12/24 - patient able to recall 3/3 back precautions Required Braces or Orthoses: Yes Spinal Brace: Lumbar corset Restrictions Weight Bearing Restrictions: No  Skilled Therapeutic Interventions/Progress Updates:    Patient found supine in bed. Encouraged patient to sit edge of bed to eat breakfast. Min assist supine to sit edge of bed. Recommended patient put bilateral feet flat on floor, patient with statement "I'm not going to do that, my feet hurt too much when they're on the floor". Treatment focus on increasing activity tolerance & dynamic sitting balance/tolerance unsupported edge of bed, UB dressing in sitting position unsupported, donning lumbar back brace, and orienting patient to place & date.   General General Chart Reviewed: Yes  Pain Pain Assessment Pain Score:   8 Pain Location: Leg Pain Orientation: Right ("whole let, all the way down to the big toe") RN aware  Therapy/Group: Individual Therapy  Jeremaih Klima 03/11/2011, 7:51 AM

## 2011-03-11 NOTE — Progress Notes (Signed)
Occupational Therapy Note  Patient Details  Name: [name] MRN: [id number] Date of Birth: 10-24-1950 Today's Date: 03/11/2011  1430-1500 - 30 Minutes Individual Therapy Patient complained of 8/10 pain throughout right LE. RN aware.  Therapeutic exercise focusing on UB strengthening/range of motion and increasing overall activity tolerance/endurance. Patient with complaints of "I don't want to do anything where I have to move my legs". Patient willing to use weights seated in w/c. Positioned patient for comfort; call bell within reach.   Violet Cart 03/11/2011, 3:01 PM

## 2011-03-11 NOTE — Progress Notes (Signed)
Quiet but makes needs known, requires staff to manage stage II on buttocks, requires  +2 assist to bed from chair due to weakness and bil foot drop, ppoor appetite, continue megace, encourage ordering foods of choice, continue 1200cc FR, 60-2-2 diet, neuropathy in bil LE with neurontin BID and  Continue CIPRO q am 500 mg, continue to note pedal edema bil.  Pain managed with percocet q 4 hrs graph lft UE with +/+, anuric, await HD on Wednesday due to holiday tomorrow.  Feeds self, pills whole with water.  Rt ileostomy managed by staff, JP drain intact, flushed TID per order. No other change in assessment, continue plan of care. Margarito Liner

## 2011-03-11 NOTE — Progress Notes (Signed)
Physical Therapy Note  Patient Details  Name: [name] MRN: [id number] Date of Birth: 07/13/1950 Today's Date: 03/11/2011  Time:  10:00-10:57 Time calculation: 57 min.  Pain:  Reports pain in middle of belly, groin, and "all the way down the right leg."  Rated at 8/10.  RN brought pain meds.  Skilled therapy intervention during individual treatment to address:  1)LE strengthening by supine mat exercises; heel slides and hip ABD/ADD, performed 5 reps of each exercise x 2, AAROM, therapist performing approx. 75%.  2)  Transfer training:  Scoot-pivot to level surfaces with supervision, sliding board to uneven surfaces with min@.   Pt reports that after therapist attempted stretching on feet, she still felt that therapist was touching feet.  Pt unable to tolerate touching of bilateral feet.  Pt able to tolerate small bouts of activity with rest breaks.  Limited by pain, sensitivity of feet, and decreased endurance.  Waylan Boga 03/11/2011, 10:24 AM

## 2011-03-11 NOTE — Progress Notes (Signed)
4 Days Post-Op  Subjective: Patient with no new c/o.Back from PT.  Objective: Vital signs in last 24 hours: Temp:  [98.9 F (37.2 C)-99.6 F (37.6 C)] 98.9 F (37.2 C) (12/24 0500) Pulse Rate:  [102-103] 102  (12/24 0500) Resp:  [18] 18  (12/24 0500) BP: (103-122)/(68-77) 122/77 mmHg (12/24 0500) SpO2:  [98 %] 98 % (12/24 0500) Weight:  [111 lb 15.9 oz (50.8 kg)] 111 lb 15.9 oz (50.8 kg) (12/24 0500) Last BM Date: 03/10/11  Intake/Output from previous day: 12/23 0701 - 12/24 0700 In: 480 [P.O.:480] Out: 405 [Drains:5; Stool:400] Intake/Output this shift: Total I/O In: 240 [P.O.:240] Out: -   Abdominal drain intact, dressing dry, about 15 cc's serous fluid in bulb, cx's e coli  Lab Results:   Basename 03/10/11 0500 03/09/11 1359  WBC 11.3* 13.0*  HGB 7.9* 8.1*  HCT 25.7* 26.0*  PLT 428* 513*   BMET  Basename 03/10/11 0500 03/09/11 1358  NA 140 141  K 4.2 4.0  CL 102 102  CO2 30 25  GLUCOSE 109* 113*  BUN 19 31*  CREATININE 3.13* 5.28*  CALCIUM 9.1 9.5   PT/INR No results found for this basename: LABPROT:2,INR:2 in the last 72 hours ABG No results found for this basename: PHART:2,PCO2:2,PO2:2,HCO3:2 in the last 72 hours  Studies/Results: Results for orders placed during the hospital encounter of 02/25/11  CULTURE, BLOOD (ROUTINE X 2)     Status: Normal   Collection Time   03/05/11  4:25 PM      Component Value Range Status Comment   Specimen Description BLOOD HEMODIALYSIS GRAFT   Final    Special Requests BOTTLES DRAWN AEROBIC AND ANAEROBIC 10CC   Final    Setup Time [id number]   Final    Culture NO GROWTH 5 DAYS   Final    Report Status 03/11/2011 FINAL   Final   CULTURE, BLOOD (ROUTINE X 2)     Status: Normal   Collection Time   03/05/11  4:39 PM      Component Value Range Status Comment   Specimen Description BLOOD HEMODIALYSIS GRAFT   Final    Special Requests BOTTLES DRAWN AEROBIC AND ANAEROBIC 10CC   Final    Setup Time [id number]   Final     Culture NO GROWTH 5 DAYS   Final    Report Status 03/11/2011 FINAL   Final   BODY FLUID CULTURE     Status: Normal   Collection Time   03/08/11  9:47 AM      Component Value Range Status Comment   Specimen Description PERITONEAL   Final    Special Requests PERITONEAL CAVITY FLUID   Final    Gram Stain     Final    Value: ABUNDANT WBC PRESENT, PREDOMINANTLY PMN     NO ORGANISMS SEEN   Culture     Final    Value: MODERATE ESCHERICHIA COLI     Note: CRITICAL RESULT CALLED TO, READ BACK BY AND VERIFIED WITH: RN A. ROYAL ON 03/09/11 BY TEDAR   Report Status 03/10/2011 FINAL   Final    Organism ID, Bacteria ESCHERICHIA COLI   Final     Anti-infectives: Anti-infectives     Start     Dose/Rate Route Frequency Ordered Stop   03/11/11 0800   ciprofloxacin (CIPRO) tablet 500 mg  Status:  Discontinued        500 mg Oral 2 times daily 03/11/11 0752 03/11/11 0754   03/11/11 0800  ciprofloxacin (CIPRO) tablet 500 mg        500 mg Oral Daily with breakfast 03/11/11 0754     03/09/11 2000   ertapenem (INVANZ) 0.5 g in sodium chloride 0.9 % 50 mL IVPB  Status:  Discontinued        500 mg 100 mL/hr over 30 Minutes Intravenous Every 24 hours 03/09/11 1702 03/11/11 0752   03/09/11 1800   ertapenem (INVANZ) 0.5 g in sodium chloride 0.9 % 50 mL IVPB  Status:  Discontinued        500 mg 100 mL/hr over 30 Minutes Intravenous Every 24 hours 03/09/11 1658 03/09/11 1702   03/08/11 1100   metroNIDAZOLE (FLAGYL) IVPB 500 mg  Status:  Discontinued        500 mg 100 mL/hr over 60 Minutes Intravenous Every 8 hours 03/08/11 1036 03/11/11 0752   03/03/11 1200   ertapenem (INVANZ) 0.5 g in sodium chloride 0.9 % 50 mL IVPB  Status:  Discontinued        500 mg 100 mL/hr over 30 Minutes Intravenous Every 24 hours 03/03/11 1108 03/09/11 1658   03/03/11 1045   ertapenem (INVANZ) 1 g in sodium chloride 0.9 % 50 mL IVPB  Status:  Discontinued        1 g 100 mL/hr over 30 Minutes Intravenous Every 24 hours  03/03/11 1043 03/03/11 1107          Assessment/Plan: s/p right flank abscess drainage 12/21; check f/u CT later this week.    LOS: 14 days    Sandrika Schwinn,D [facility] 03/11/2011

## 2011-03-11 NOTE — Progress Notes (Signed)
Patient ID: [name], female   DOB: 1950-08-29, 60 y.o.   MRN: [id number] Patient ID: [name], female   DOB: Jan 25, 1951, 60 y.o.   MRN: [id number] Patient ID: [name], female   DOB: 12/10/1950, 60 y.o.   MRN: [id number] Patient ID: [name], female   DOB: Aug 31, 1950, 60 y.o.   MRN: [id number] Subjective/Complaints: Review of Systems  Musculoskeletal: Positive for back pain.  All other systems reviewed and are negative.  Feet remain sore and hypersensitive  Objective: Vital Signs: Blood pressure 122/77, pulse 102, temperature 98.9 F (37.2 C), temperature source Oral, resp. rate 18, height 5\' 5"  (1.651 m), weight 50.8 kg (111 lb 15.9 oz), SpO2 98.00%. No results found.  Basename 03/10/11 0500 03/09/11 1359  WBC 11.3* 13.0*  HGB 7.9* 8.1*  HCT 25.7* 26.0*  PLT 428* 513*    Basename 03/10/11 0500 03/09/11 1358  NA 140 141  K 4.2 4.0  CL 102 102  CO2 30 25  GLUCOSE 109* 113*  BUN 19 31*  CREATININE 3.13* 5.28*  CALCIUM 9.1 9.5   CBG (last 3)   Basename 03/09/11 1809  GLUCAP 88    Wt Readings from Last 3 Encounters:  03/11/11 50.8 kg (111 lb 15.9 oz)  03/11/11 50.8 kg (111 lb 15.9 oz)  02/24/11 56.4 kg (124 lb 5.4 oz)    Physical Exam:  General appearance: alert, cooperative and no distress Head: Normocephalic, without obvious abnormality, atraumatic Eyes: conjunctivae/corneas clear. PERRL, EOM's intact. Fundi benign. Ears: normal TM's and external ear canals both ears Nose: Nares normal. Septum midline. Mucosa normal. No drainage or sinus tenderness. Throat: lips, mucosa, and tongue normal; teeth and gums normal Neck: no adenopathy, no carotid bruit, no JVD, supple, symmetrical, trachea midline and thyroid not enlarged, symmetric, no tenderness/mass/nodules  R   IJ in place Back: symmetric, no curvature. ROM normal. No CVA tenderness. Resp: clear to auscultation bilaterally Cardio: regular rate and rhythm, S1, S2 normal, Gr 3/6 SEM  No   click, rub or gallop; rate 90-100 GI: soft, non-tender; bowel sounds normal; no masses,  no organomegaly;  colostomy noted Extremities: extremities normal, atraumatic, no cyanosis or edema slight edema distally only; AV fistula R arm Pulses: 2+ and symmetric Skin: Skin color, texture, turgor normal. No rashes or lesions Neurologic: continued foot drop tr/5 but pt denies sensory changes. Plantarflexion 3-4/5.  Generalized weakness in proximal legs 3/5.  Heel cords loose Incision/Wound: wounds all clean and intact.  Ostomy site sealed.  Saw no blood in stool today. Right thigh less tender, but no gross swelling.  Skin is warm throughout.  CBG (last 3)   Basename 03/09/11 1809  GLUCAP 88    Assessment/Plan: 1. Functional deficits secondary to lumbar stenosis with radiculopathy s/p decompression and ischemic colitis requiring colectomy which require 3+ hours per day of interdisciplinary therapy in a comprehensive inpatient rehab setting. Physiatrist is providing close team supervision and 24 hour management of active medical problems listed below. Physiatrist and rehab team continue to assess barriers to discharge/monitor patient progress toward functional and medical goals.    Mobility: Bed Mobility Bed Mobility: Yes Rolling Right: 4: Min assist;With rail Rolling Right Details (indicate cue type and reason): Cues to bend bil LEs and roll completely before pushign up to sit Left Sidelying to Sit: 3: Mod assist Left Sidelying to Sit Details (indicate cue type and reason): Increased effort and pain with coming up to sit'  Transfers Transfers: Yes Sit to Stand: 2: Max  assist;With upper extremity assist;From chair/3-in-1 Stand to Sit: 2: Max assist;With upper extremity assist;To chair/3-in-1 Squat Pivot Transfers: 3: Mod assist Ambulation/Gait Ambulation/Gait Assistance: 4: Min assist Ambulation/Gait Assistance Details (indicate cue type and reason): A for LE lifting and placing due to  bil LE foot drop, A for seaight shifting to unweight opposite LE, blocking knees Ambulation Distance (Feet): 8 Feet Assistive device: Rolling walker Gait Pattern: Decreased dorsiflexion - right;Decreased dorsiflexion - left;Right foot flat;Left foot flat;Right genu recurvatum;Left genu recurvatum;Trunk flexed;Right hip hike Stairs: No (Unsafe t this time) Architect: Yes Wheelchair Assistance: 4: Min Lexicographer: Both upper extremities Wheelchair Parts Management: Supervision/cueing (Cues for maintaining back precautions with parts management) Distance: 150 ADL:   Cognition: Cognition Overall Cognitive Status: Appears within functional limits for tasks assessed Arousal/Alertness: Lethargic Orientation Level: Oriented to person;Oriented to place;Oriented to time Memory: Impaired (forgot date after 1 minute) Awareness: Appears intact Problem Solving: Appears intact Safety/Judgment: Appears intact Cognition Arousal/Alertness: Lethargic Orientation Level: Oriented to person;Oriented to place;Oriented to time  1.Lumbar stenosis with radiculopathy-S/P laminectomy Lumbar 4-5 and L 5 S1 11/14.Back corset when out of bed.Routine back precautions. Consider b/l AFO's for gait, but with edema presently, i'm not anxious to order. Edema control.  2. DVT Prophylaxis/Anticoagulation: SCD,s/ and support hose. Dopplers negative  3. Pain Management: percocet.prn--- will resume low dose neurontin as i doubt this was at the core of AMS and because foot pain is becoming a real problem again.  4.Ischemic Colitis-S/P exp lap with right hemicolectomy and ileostomy 11/24.Provide ileostomy care and education. She is doing some ostomy care already on her own.   -wbc's falling.  Remains on invanz and flagyl.  Need to determine abx duration/route/plan and transition to home mgt               5.ESRD-dialysis as directed per renal services.Weigh patient daily.Follow  up labs with dialysis.    6Chronic anemia-aranesp with HD.Follow up CBC with HD and transfuse if needed.   7.HTN-No present meds and monitor with increased activity.   8. AMS:  Much better with rx of above.      LOS (Days) 14 A FACE TO FACE EVALUATION WAS PERFORMED  Merik Mignano T 03/11/2011, 7:34 AM

## 2011-03-11 NOTE — Progress Notes (Signed)
Physical Therapy Note  Patient Details  Name: [name] MRN: [id number] Date of Birth: 05-Oct-1950 Today's Date: 03/11/2011  Time:  13:02-13:59 Time Calculation:  57 min.  Pain:  None, pt reports that pain meds from earlier session helped a lot.  Subjective:  Pt talking about the machines she used at the [facility]. Individual therapy session addressing LE strengthening on NuStep x 20 min.  Pt much improved this pm from am.  Slowly, performing 20 min on NuStep with multiple rest breaks as she was able to tolerate. Pt reports that feet were sensitive like am treatment and was able to tolerate pressure on feet with NuStep, unlike am treatment.   Waylan Boga 03/11/2011, 1:30 PM

## 2011-03-11 NOTE — Progress Notes (Signed)
Patient ID: [name], female   DOB: Oct 15, 1950, 60 y.o.   MRN: [id number] 4 Days Post-Op  Subjective: No complaints.  Tolerating po  Objective: Vital signs in last 24 hours: Temp:  [98.9 F (37.2 C)-99.6 F (37.6 C)] 98.9 F (37.2 C) (12/24 0500) Pulse Rate:  [102-103] 102  (12/24 0500) Resp:  [18] 18  (12/24 0500) BP: (103-122)/(68-77) 122/77 mmHg (12/24 0500) SpO2:  [98 %] 98 % (12/24 0500) Weight:  [111 lb 15.9 oz (50.8 kg)] 111 lb 15.9 oz (50.8 kg) (12/24 0500) Last BM Date: 03/10/11  Intake/Output this shift:  Minimal drain output    Physical Exam: BP 122/77  Pulse 102  Temp(Src) 98.9 F (37.2 C) (Oral)  Resp 18  Ht 5\' 5"  (1.651 m)  Wt 111 lb 15.9 oz (50.8 kg)  BMI 18.64 kg/m2  SpO2 98% Abdomen soft, incision healed, ostomy pink Drain serosang  Labs: CBC  Basename 03/10/11 0500 03/09/11 1359  WBC 11.3* 13.0*  HGB 7.9* 8.1*  HCT 25.7* 26.0*  PLT 428* 513*   BMET  Basename 03/10/11 0500 03/09/11 1358  NA 140 141  K 4.2 4.0  CL 102 102  CO2 30 25  GLUCOSE 109* 113*  BUN 19 31*  CREATININE 3.13* 5.28*  CALCIUM 9.1 9.5   LFT  Basename 03/10/11 0500  PROT --  ALBUMIN 1.8*  AST --  ALT --  ALKPHOS --  BILITOT --  BILIDIR --  IBILI --  LIPASE --   PT/INR No results found for this basename: LABPROT:2,INR:2 in the last 72 hours ABG No results found for this basename: PHART:2,PCO2:2,PO2:2,HCO3:2 in the last 72 hours  Studies/Results: No results found.  Assessment: Principal Problem:  *Radiculopathy of lumbar region   Procedure(s): COLONOSCOPY  Plan: Agree with change of antibiotics to Cipro.  Hopefully drain out soon  LOS: 14 days    Kearie Mennen A 03/11/2011

## 2011-03-11 NOTE — Progress Notes (Signed)
Patient ID: [name], female   DOB: 1951-01-15, 60 y.o.   MRN: [id number]  S: Reports some minimal abdominal pain, able to tolerate oral intake. She states that she is using the incentive spirometer to help her breathe better. Denies nausea or vomiting.  O: BP 122/77  Pulse 102  Temp(Src) 98.9 F (37.2 C) (Oral)  Resp 18  Ht 5\' 5"  (1.651 m)  Wt 50.8 kg (111 lb 15.9 oz)  BMI 18.64 kg/m2  SpO2 98%  [id number] resting on the side of her bed. [id number] RRR, Normal S1 with loud S2 Resp:CTA bilaterally, no rales/rhonchi [id number], flat, mildly tender over the right upper and lower quadrants, BS normal Ext:No LE edema.       . ciprofloxacin  500 mg Oral Q breakfast  . darbepoetin (ARANESP) injection - DIALYSIS  200 mcg Intravenous Q Thu-HD  . feeding supplement (NEPRO CARB STEADY)  237 mL Oral TID WC  . gabapentin  100 mg Oral BID  . megestrol  200 mg Oral BID  . multivitamin  1 tablet Oral Daily  . oxyCODONE-acetaminophen  1 tablet Oral BID  . pantoprazole  40 mg Oral Q1200  . DISCONTD: ciprofloxacin  500 mg Oral BID  . DISCONTD: ertapenem  500 mg Intravenous Q24H  . DISCONTD: metronidazole  500 mg Intravenous Q8H    BMET  Lab 03/10/11 0500 03/09/11 1358 03/07/11 0510 03/06/11 0615 03/05/11 1849  NA 140 141 136 138 134*  K 4.2 4.0 3.8 3.3* 3.6  CL 102 102 96 97 94*  CO2 30 25 25 29 27   GLUCOSE 109* 113* 69* 87 102*  BUN 19 31* 30* 20 41*  CREATININE 3.13* 5.28* 5.67* 4.37* 7.18*  ALB -- -- -- -- --  CALCIUM 9.1 9.5 9.7 9.5 9.6  PHOS 1.8* 2.6 -- -- 2.2*   CBC  Lab 03/10/11 0500 03/09/11 1359 03/07/11 0510 03/06/11 0615  WBC 11.3* 13.0* 18.5* 20.1*  NEUTROABS -- -- -- --  HGB 7.9* 8.1* 8.1* 8.8*  HCT 25.7* 26.0* 26.0* 27.6*  MCV 87.7 88.1 87.2 87.6  PLT 428* 513* 360 291    Assessment/Plan:  1. ESRD - on HD on TTS @ [facility], plan for hemodialysis today per holiday schedule. Doing well from a volume standpoint and clinically without any acute indications  to rush to dialysis.  2. Abdominal abcess - Right flank drain placed 12/21, body fluid culture from her drained intra-abdominal abscess showed E. Coli, currently on narrowed antibiotic spectrum with ciprofloxacin.  3. Anemia - Hgb low at 7.9, on Aranesp 200 mcg on Thurs. Fe studies pending.  4. Secondary hyperparathyroidism - Off binders, low P.  5. Nutrition - on Nepro to supplement oral intake. 6. Lumbar radiculopathy: Management per inpatient rehabilitation service.     Ellayna Hilligoss K.

## 2011-03-12 ENCOUNTER — Inpatient Hospital Stay (HOSPITAL_COMMUNITY): Payer: Medicare Other

## 2011-03-12 MED ORDER — OXYCODONE-ACETAMINOPHEN 5-325 MG PO TABS
ORAL_TABLET | ORAL | Status: AC
  Filled 2011-03-12: qty 1

## 2011-03-12 NOTE — Progress Notes (Signed)
Patient ID: KYNLI AJELLO, female   DOB: 1950/04/21, 60 y.o.   MRN: [id number] Patient ID: ORAMAE IMPERATO, female   DOB: May 05, 1950, 60 y.o.   MRN: [id number] Patient ID: EILEE CURRIER, female   DOB: [birth year], 60 y.o.   MRN: [id number] Patient ID: JENIFER CROSSETT, female   DOB: 19-Oct-1950, 60 y.o.   MRN: [id number] Patient ID: HUNTER ABDULMALIK, female   DOB: Feb 19, 1951, 60 y.o.   MRN: [id number] Subjective/Complaints: Review of Systems  Musculoskeletal: Positive for back pain.  All other systems reviewed and are negative.  Feet remain sore and hypersensitive  Objective: Vital Signs: Blood pressure 122/77, pulse 102, temperature 98.9 F (37.2 C), temperature source Oral, resp. rate 18, height 5\' 5"  (1.651 m), weight 50.8 kg (111 lb 15.9 oz), SpO2 98.00%. No results found.  Basename 03/10/11 0500 03/09/11 1359  WBC 11.3* 13.0*  HGB 7.9* 8.1*  HCT 25.7* 26.0*  PLT 428* 513*    Basename 03/10/11 0500 03/09/11 1358  NA 140 141  K 4.2 4.0  CL 102 102  CO2 30 25  GLUCOSE 109* 113*  BUN 19 31*  CREATININE 3.13* 5.28*  CALCIUM 9.1 9.5   CBG (last 3)   Basename 03/09/11 1809  GLUCAP 88    Wt Readings from Last 3 Encounters:  03/11/11 50.8 kg (111 lb 15.9 oz)  03/11/11 50.8 kg (111 lb 15.9 oz)  02/24/11 56.4 kg (124 lb 5.4 oz)    Physical Exam:  General appearance: alert, cooperative and no distress Head: Normocephalic, without obvious abnormality, atraumatic Eyes: conjunctivae/corneas clear. PERRL, EOM's intact. Fundi benign. Ears: normal TM's and external ear canals both ears Nose: Nares normal. Septum midline. Mucosa normal. No drainage or sinus tenderness. Throat: lips, mucosa, and tongue normal; teeth and gums normal Neck: no adenopathy, no carotid bruit, no JVD, supple, symmetrical, trachea midline and thyroid not enlarged, symmetric, no tenderness/mass/nodules  R   IJ in place Back: symmetric, no curvature. ROM normal. No CVA tenderness. Resp: clear to  auscultation bilaterally Cardio: regular rate and rhythm, S1, S2 normal, Gr 3/6 SEM  No  click, rub or gallop; rate 90-100 GI: soft, non-tender; bowel sounds normal; no masses,  no organomegaly;  colostomy noted Extremities: extremities normal, atraumatic, no cyanosis or edema slight edema distally only; AV fistula R arm Pulses: 2+ and symmetric Skin: Skin color, texture, turgor normal. No rashes or lesions Neurologic: continued foot drop tr/5 but pt denies sensory changes. Plantarflexion 3-4/5.  Generalized weakness in proximal legs 3/5.  Heel cords loose Incision/Wound: wounds all clean and intact.  Ostomy site sealed.  Saw no blood in stool today. Right thigh less tender, but no gross swelling.  Skin is warm throughout.  CBG (last 3)   Basename 03/09/11 1809  GLUCAP 88    Assessment/Plan: 1. Functional deficits secondary to lumbar stenosis with radiculopathy s/p decompression and ischemic colitis requiring colectomy which require 3+ hours per day of interdisciplinary therapy in a comprehensive inpatient rehab setting. Physiatrist is providing close team supervision and 24 hour management of active medical problems listed below. Physiatrist and rehab team continue to assess barriers to discharge/monitor patient progress toward functional and medical goals.    Mobility: Bed Mobility Bed Mobility: Yes Rolling Right: 4: Min assist;With rail Rolling Right Details (indicate cue type and reason): Cues to bend bil LEs and roll completely before pushign up to sit Left Sidelying to Sit: 3: Mod assist Left Sidelying to Sit Details (indicate cue type and reason): Increased effort  and pain with coming up to sit'  Transfers Transfers: Yes Sit to Stand: 2: Max assist;With upper extremity assist;From chair/3-in-1 Stand to Sit: 2: Max assist;With upper extremity assist;To chair/3-in-1 Squat Pivot Transfers: 3: Mod assist Ambulation/Gait Ambulation/Gait Assistance: 4: Min assist Ambulation/Gait  Assistance Details (indicate cue type and reason): A for LE lifting and placing due to bil LE foot drop, A for seaight shifting to unweight opposite LE, blocking knees Ambulation Distance (Feet): 8 Feet Assistive device: Rolling walker Gait Pattern: Decreased dorsiflexion - right;Decreased dorsiflexion - left;Right foot flat;Left foot flat;Right genu recurvatum;Left genu recurvatum;Trunk flexed;Right hip hike Stairs: No (Unsafe t this time) Architect: Yes Wheelchair Assistance: 4: Min Lexicographer: Both upper extremities Wheelchair Parts Management: Supervision/cueing (Cues for maintaining back precautions with parts management) Distance: 150 ADL:   Cognition: Cognition Overall Cognitive Status: Appears within functional limits for tasks assessed Arousal/Alertness: Lethargic Orientation Level: Oriented to person;Oriented to place;Oriented to time Memory: Impaired (forgot date after 1 minute) Awareness: Appears intact Problem Solving: Appears intact Safety/Judgment: Appears intact Cognition Arousal/Alertness: Lethargic Orientation Level: Oriented to person;Oriented to place;Oriented to time  1.Lumbar stenosis with radiculopathy-S/P laminectomy Lumbar 4-5 and L 5 S1 11/14.Back corset when out of bed.Routine back precautions. Consider b/l AFO's for gait, but with edema presently, i'm not anxious to order. Edema control. Probably will try AFO's as an outpt.  2. DVT Prophylaxis/Anticoagulation: SCD,s/ and support hose. Dopplers negative  3. Pain Management: percocet.prn--- will resume low dose neurontin as i doubt this was at the core of AMS and because foot pain is becoming a real problem again.  Observe mental status closely.  4.Ischemic Colitis-S/P exp lap with right hemicolectomy and ileostomy 11/24.Provide ileostomy care and education. She is doing some ostomy care already on her own.   -wbc's improving.  Converted to cipro yesterday  which will cover e coli.  -patient show continued clinical improvement  -drain per surgery and IR  5. ESRD: dialysis as directed per renal services.Weigh patient daily.Follow up labs with dialysis.    6Chronic anemia-aranesp with HD.Follow up CBC with HD and transfuse if needed.   7.HTN-No present meds and monitor with increased activity.   8. AMS:  Much better with rx of above.      LOS (Days) 14 A FACE TO FACE EVALUATION WAS PERFORMED  SWARTZ,ZACHARY T 03/11/2011, 7:34 AM

## 2011-03-12 NOTE — Progress Notes (Signed)
Back to room via bed from hemodialysis unit.  Patient without distress.  RUE AVF with positive bruit and thrill.

## 2011-03-12 NOTE — Progress Notes (Signed)
5 Days Post-Op  Subjective: Complains of painful feet. abd ok  Objective: Vital signs in last 24 hours: Temp:  [97.7 F (36.5 C)-99.4 F (37.4 C)] 97.7 F (36.5 C) (12/25 0718) Pulse Rate:  [95-109] 109  (12/25 0718) Resp:  [12-16] 16  (12/25 0718) BP: (105-153)/(50-88) 123/81 mmHg (12/25 0718) SpO2:  [98 %-100 %] 98 % (12/25 0718) Weight:  [114 lb 6.7 oz (51.9 kg)-114 lb 13.8 oz (52.1 kg)] 114 lb 6.7 oz (51.9 kg) (12/25 0718) Last BM Date: 03/11/11  Intake/Output from previous day: 12/24 0701 - 12/25 0700 In: 723 [P.O.:720] Out: 325 [Stool:175] Intake/Output this shift: Total I/O In: -  Out: 210 [Drains:10; Stool:200]  GI: soft, nontender. ostomy pink and productive. drain intact  Lab Results:   Basename 03/11/11 1155 03/10/11 0500  WBC 13.1* 11.3*  HGB 8.5* 7.9*  HCT 27.6* 25.7*  PLT 453* 428*   BMET  Basename 03/11/11 1155 03/10/11 0500  NA 138 140  K 4.2 4.2  CL 101 102  CO2 24 30  GLUCOSE 122* 109*  BUN 35* 19  CREATININE 4.67* 3.13*  CALCIUM 9.4 9.1   PT/INR No results found for this basename: LABPROT:2,INR:2 in the last 72 hours ABG No results found for this basename: PHART:2,PCO2:2,PO2:2,HCO3:2 in the last 72 hours  Studies/Results: No results found.  Anti-infectives: Anti-infectives     Start     Dose/Rate Route Frequency Ordered Stop   03/11/11 0800   ciprofloxacin (CIPRO) tablet 500 mg  Status:  Discontinued        500 mg Oral 2 times daily 03/11/11 0752 03/11/11 0754   03/11/11 0800   ciprofloxacin (CIPRO) tablet 500 mg        500 mg Oral Daily with breakfast 03/11/11 0754     03/09/11 2000   ertapenem (INVANZ) 0.5 g in sodium chloride 0.9 % 50 mL IVPB  Status:  Discontinued        500 mg 100 mL/hr over 30 Minutes Intravenous Every 24 hours 03/09/11 1702 03/11/11 0752   03/09/11 1800   ertapenem (INVANZ) 0.5 g in sodium chloride 0.9 % 50 mL IVPB  Status:  Discontinued        500 mg 100 mL/hr over 30 Minutes Intravenous Every 24 hours  03/09/11 1658 03/09/11 1702   03/08/11 1100   metroNIDAZOLE (FLAGYL) IVPB 500 mg  Status:  Discontinued        500 mg 100 mL/hr over 60 Minutes Intravenous Every 8 hours 03/08/11 1036 03/11/11 0752   03/03/11 1200   ertapenem (INVANZ) 0.5 g in sodium chloride 0.9 % 50 mL IVPB  Status:  Discontinued        500 mg 100 mL/hr over 30 Minutes Intravenous Every 24 hours 03/03/11 1108 03/09/11 1658   03/03/11 1045   ertapenem (INVANZ) 1 g in sodium chloride 0.9 % 50 mL IVPB  Status:  Discontinued        1 g 100 mL/hr over 30 Minutes Intravenous Every 24 hours 03/03/11 1043 03/03/11 1107          Assessment/Plan: s/p Procedure(s): COLONOSCOPY Continue cipro for e coli in abd fluid Encourage PO's  LOS: 15 days    TOTH III,Amal Saiki S 03/12/2011

## 2011-03-12 NOTE — Progress Notes (Addendum)
Patient ID: [name], female   DOB: 04-09-1950, 60 y.o.   MRN: [id number]   S: Reports some bilateral leg pain, states that abdominal pain is tolerable on Percocet. Denies any nausea/vomiting and per her daughter is mentating at baseline.   O: BP 123/81  Pulse 109  Temp(Src) 97.7 F (36.5 C) (Oral)  Resp 16  Ht 5\' 5"  (1.651 m)  Wt 51.9 kg (114 lb 6.7 oz)  BMI 19.04 kg/m2  SpO2 98%  [id number] resting in bed, daughter by bedside.  [id number] RRR, Normal S1 with loud S2 Resp:CTA bilaterally, no rales/rhonchi [id number], flat, mildly tender over lower quadrants , BS normal Ext:No LE edema.       Marland Kitchen alteplase  2 mg Intracatheter Once  . ciprofloxacin  500 mg Oral Q breakfast  . darbepoetin (ARANESP) injection - DIALYSIS  200 mcg Intravenous Q Thu-HD  . feeding supplement (NEPRO CARB STEADY)  237 mL Oral TID WC  . gabapentin  100 mg Oral BID  . megestrol  200 mg Oral BID  . multivitamin  1 tablet Oral Daily  . oxyCODONE-acetaminophen  1 tablet Oral BID  . oxyCODONE-acetaminophen      . pantoprazole  40 mg Oral Q1200    BMET  Lab 03/11/11 1155 03/10/11 0500 03/09/11 1358 03/07/11 0510 03/06/11 0615 03/05/11 1849  NA 138 140 141 136 138 134*  K 4.2 4.2 4.0 3.8 3.3* 3.6  CL 101 102 102 96 97 94*  CO2 24 30 25 25 29 27   GLUCOSE 122* 109* 113* 69* 87 102*  BUN 35* 19 31* 30* 20 41*  CREATININE 4.67* 3.13* 5.28* 5.67* 4.37* 7.18*  ALB -- -- -- -- -- --  CALCIUM 9.4 9.1 9.5 9.7 9.5 9.6  PHOS 2.3 1.8* 2.6 -- -- 2.2*   CBC  Lab 03/11/11 1155 03/10/11 0500 03/09/11 1359 03/07/11 0510  WBC 13.1* 11.3* 13.0* 18.5*  NEUTROABS -- -- -- --  HGB 8.5* 7.9* 8.1* 8.1*  HCT 27.6* 25.7* 26.0* 26.0*  MCV 89.3 87.7 88.1 87.2  PLT 453* 428* 513* 360    Assessment/Plan:  1. ESRD - on HD on TTS @ [facility], planned for hemodialysis yesterday per holiday schedule however just got back from dialysis earlier this morning. She tolerated the treatment well and without any critical  intradialytic hypotension.  2. Abdominal abcess - Right flank drain placed 12/21, body fluid culture from her drained intra-abdominal abscess showed E. Coli, currently on narrowed antibiotic spectrum with ciprofloxacin.  3. Anemia - Hgb low at 7.9, on Aranesp 200 mcg on Thurs. Fe studies pending.  4. Secondary hyperparathyroidism - Off binders, low P.  5. Nutrition - on Nepro to supplement oral intake.  6. Lumbar radiculopathy: Management per inpatient rehabilitation service.  7. Bilateral lower extremity pain: On gabapentin 100 mg twice a day for what seems to be neuropathic leg pain. I informed Ms. Archacki that this medication may take a while to show its beneficial effects, this episode may need to be bridged over with Percocet as she has been taking.  Sharron Petruska K.

## 2011-03-12 NOTE — Progress Notes (Signed)
Patient taken to hemodialysis via bed by MS, RN.  Patient without distress.

## 2011-03-12 NOTE — Progress Notes (Signed)
Patient able to make needs known appropriately, memory deficits noted. Has pedal edema bilaterally, and edema noted Left UE- elevated on pillows. Ileostomy care maintained by staff, and JP drain intact. Allevyn to buttocks, and abdominal incision with steris well approximated. Appetite improved today, currently taking megace. Transfers +2 assist, has bilateral foot drop needs encouragement to wear PRAFO boot.

## 2011-03-13 LAB — GLUCOSE, CAPILLARY

## 2011-03-13 MED ORDER — HEPARIN SODIUM (PORCINE) 1000 UNIT/ML DIALYSIS
40.0000 [IU]/kg | INTRAMUSCULAR | Status: DC | PRN
  Filled 2011-03-13: qty 3

## 2011-03-13 MED ORDER — GABAPENTIN 100 MG PO CAPS
200.0000 mg | ORAL_CAPSULE | Freq: Every day | ORAL | Status: DC
  Administered 2011-03-13: 200 mg via ORAL
  Filled 2011-03-13 (×2): qty 2

## 2011-03-13 MED ORDER — GABAPENTIN 100 MG PO CAPS
100.0000 mg | ORAL_CAPSULE | Freq: Every day | ORAL | Status: DC
  Filled 2011-03-13 (×2): qty 1

## 2011-03-13 NOTE — Progress Notes (Signed)
Occupational Therapy Session Note  Patient Details  Name: ODESSIE STIPES MRN: [id number] Date of Birth: 12-24-1950  Today's Date: 03/13/2011 Time: [id number] Time Calculation (min): 27 min  Precautions: Precautions Precautions: Back;Fall Precaution Booklet Issued: No Precaution Comments: 12/24 - patient able to recall 3/3 back precautions Required Braces or Orthoses: Yes Spinal Brace: Lumbar corset Restrictions Weight Bearing Restrictions: No  Short Term Goals: OT Short Term Goal 1: Patient will perform transfers at min assist level using adaptive equipment prn OT Short Term Goal 1 - Progress: Progressing toward goal OT Short Term Goal 2: Patient will be able to verbalize and adhere to 3/3 back preacautions independently  OT Short Term Goal 2 - Progress: Met OT Short Term Goal 3: Patient will donn back brace with set-up assist OT Short Term Goal 3 - Progress: Met OT Short Term Goal 4: ADL short term goals = ADL long term goals OT Short Term Goal 4 - Progress: Progressing toward goal  Skilled Therapeutic Interventions/Progress Updates:    Engaged in BUE strengthening with 2# weights in sitting.  Focus on biceps and triceps and PNF pattern to increase functional AROM and increase UB strength to assist with sit to stand and transfers.  Pt reports not wanting to leave room, but agreeable to participating in strength training.   Pain Pt with no c/o pain this session  Therapy/Group: Individual Therapy  Sim Boast 03/13/2011, 3:28 PM

## 2011-03-13 NOTE — Progress Notes (Signed)
Patient ID: [name], female   DOB: 07-Jun-1950, 60 y.o.   MRN: [id number]  S: Reports to be feeling better overall with minimal to no abdominal pain. She still has bilateral lower extremity pain.   O: BP 128/79  Pulse 111  Temp(Src) 99.1 F (37.3 C) (Oral)  Resp 20  Ht 5\' 5"  (1.651 m)  Wt 51.9 kg (114 lb 6.7 oz)  BMI 19.04 kg/m2  SpO2 98%  [id number] resting in bed [id number] regular tachycardia, S1 and S2 with ESM Resp:CTA bilaterally, no rales/rhonchi [id number], obese, NT, BS normal [id number] LE edema over ankles.       . ciprofloxacin  500 mg Oral Q breakfast  . darbepoetin (ARANESP) injection - DIALYSIS  200 mcg Intravenous Q Thu-HD  . feeding supplement (NEPRO CARB STEADY)  237 mL Oral TID WC  . gabapentin  100 mg Oral Daily  . gabapentin  200 mg Oral QHS  . megestrol  200 mg Oral BID  . multivitamin  1 tablet Oral Daily  . oxyCODONE-acetaminophen  1 tablet Oral BID  . oxyCODONE-acetaminophen      . pantoprazole  40 mg Oral Q1200  . DISCONTD: gabapentin  100 mg Oral BID    BMET  Lab 03/11/11 1155 03/10/11 0500 03/09/11 1358 03/07/11 0510  NA 138 140 141 136  K 4.2 4.2 4.0 3.8  CL 101 102 102 96  CO2 24 30 25 25   GLUCOSE 122* 109* 113* 69*  BUN 35* 19 31* 30*  CREATININE 4.67* 3.13* 5.28* 5.67*  ALB -- -- -- --  CALCIUM 9.4 9.1 9.5 9.7  PHOS 2.3 1.8* 2.6 --   CBC  Lab 03/11/11 1155 03/10/11 0500 03/09/11 1359 03/07/11 0510  WBC 13.1* 11.3* 13.0* 18.5*  NEUTROABS -- -- -- --  HGB 8.5* 7.9* 8.1* 8.1*  HCT 27.6* 25.7* 26.0* 26.0*  MCV 89.3 87.7 88.1 87.2  PLT 453* 428* 513* 360    Assessment/Plan:  1. ESRD - on HD on TTS @ [facility], plan for hemodialysis tomorrow is no acute needs are noted at this time.  2. Abdominal abcess - Right flank drain placed 12/21, body fluid culture from her drained intra-abdominal abscess showed E. Coli, currently on narrowed antibiotic spectrum with ciprofloxacin. She is afebrile but still remains tachycardic.  Clinically she is much better with resolution of what appeared to be delirium from acute infection. 3. Anemia - Hgb low at 8.5, on Aranesp 200 mcg on Thurs. No overt loss is noted, we'll continue to follow this on her dialysis days.  4. Secondary hyperparathyroidism - Off binders, low P.  5. Nutrition - on Nepro to supplement oral intake.  6. Lumbar radiculopathy: Management per inpatient rehabilitation service.  7. Bilateral lower extremity pain: On gabapentin 100 mg twice a day for what seems to be neuropathic leg pain. Continue to monitor while on rehabilitation. [name] K.

## 2011-03-13 NOTE — Progress Notes (Signed)
6 Days Post-Op  Subjective: No complaints.  Objective: Vital signs in last 24 hours: Temp:  [99 F (37.2 C)-99.1 F (37.3 C)] 99.1 F (37.3 C) (12/26 0521) Pulse Rate:  [104-111] 111  (12/26 0521) Resp:  [20] 20  (12/26 0521) BP: (107-128)/(70-79) 128/79 mmHg (12/26 0521) SpO2:  [98 %-99 %] 98 % (12/26 0521) Last BM Date: 03/11/11  Intake/Output from previous day: 12/25 0701 - 12/26 0700 In: 605 [P.O.:600] Out: 225 [Drains:25; Stool:200] Intake/Output this shift:    Exam: Drain site clean. Dressing intact About 5 ccs serous drainage in bulb 50 ccs out yesterday  Lab Results:   Basename 03/11/11 1155  WBC 13.1*  HGB 8.5*  HCT 27.6*  PLT 453*   BMET  Basename 03/11/11 1155  NA 138  K 4.2  CL 101  CO2 24  GLUCOSE 122*  BUN 35*  CREATININE 4.67*  CALCIUM 9.4   PT/INR No results found for this basename: LABPROT:2,INR:2 in the last 72 hours ABG No results found for this basename: PHART:2,PCO2:2,PO2:2,HCO3:2 in the last 72 hours  Studies/Results: No results found.  Anti-infectives: Anti-infectives     Start     Dose/Rate Route Frequency Ordered Stop   03/11/11 0800   ciprofloxacin (CIPRO) tablet 500 mg  Status:  Discontinued        500 mg Oral 2 times daily 03/11/11 0752 03/11/11 0754   03/11/11 0800   ciprofloxacin (CIPRO) tablet 500 mg        500 mg Oral Daily with breakfast 03/11/11 0754     03/09/11 2000   ertapenem (INVANZ) 0.5 g in sodium chloride 0.9 % 50 mL IVPB  Status:  Discontinued        500 mg 100 mL/hr over 30 Minutes Intravenous Every 24 hours 03/09/11 1702 03/11/11 0752   03/09/11 1800   ertapenem (INVANZ) 0.5 g in sodium chloride 0.9 % 50 mL IVPB  Status:  Discontinued        500 mg 100 mL/hr over 30 Minutes Intravenous Every 24 hours 03/09/11 1658 03/09/11 1702   03/08/11 1100   metroNIDAZOLE (FLAGYL) IVPB 500 mg  Status:  Discontinued        500 mg 100 mL/hr over 60 Minutes Intravenous Every 8 hours 03/08/11 1036 03/11/11 0752   03/03/11 1200   ertapenem (INVANZ) 0.5 g in sodium chloride 0.9 % 50 mL IVPB  Status:  Discontinued        500 mg 100 mL/hr over 30 Minutes Intravenous Every 24 hours 03/03/11 1108 03/09/11 1658   03/03/11 1045   ertapenem (INVANZ) 1 g in sodium chloride 0.9 % 50 mL IVPB  Status:  Discontinued        1 g 100 mL/hr over 30 Minutes Intravenous Every 24 hours 03/03/11 1043 03/03/11 1107          Assessment/Plan: s/p Right flank hematoma drain 12/21 Still draining. Repeat CT at some point Cult - E coli On Cipro, Invanz, Flagyl.  LOS: 16 days    [name] 03/13/2011

## 2011-03-13 NOTE — Progress Notes (Signed)
Physical Therapy Note  Patient Details  Name: LANITA PANARELLO MRN: [id number] Date of Birth: January 03, 1951 Today's Date: 03/13/2011  Individual therapy 1005-1100 (55 minutes) Pt somewhat lethargic this AM, complaint of pain in back and legs, premedicated. Sitting EOB pt tending to lose balance to the left but able to self correct. ModA squat pivot transfer from bed to w/c, difficulty coordinating movement to scoot back in chair. W/c mobility for general activity tolerance and upper extremity strengthening x 120' with multiple rest breaks needed due to fatigue. Seated therex and ROM to lower extremities. Attempted sit to stand with RW but pt unable due to weakness, declined to try further. W/c push ups for general strengthening and for pressure relief in sitting, pt with limited clearance due to weakness and fatigue. Pt complaint of being cold and feeling weak during session.   Canary Brim [facility] 03/13/2011, 12:06 PM

## 2011-03-13 NOTE — Progress Notes (Signed)
6 Days Post-Op  Subjective: Doing well  Objective: Vital signs in last 24 hours: Temp:  [99 F (37.2 C)-99.1 F (37.3 C)] 99.1 F (37.3 C) (12/26 0521) Pulse Rate:  [104-111] 111  (12/26 0521) Resp:  [20] 20  (12/26 0521) BP: (107-128)/(70-79) 128/79 mmHg (12/26 0521) SpO2:  [98 %-99 %] 98 % (12/26 0521) Last BM Date: 03/11/11  Intake/Output from previous day: 12/25 0701 - 12/26 0700 In: 605 [P.O.:600] Out: 225 [Drains:25; Stool:200] Intake/Output this shift: Total I/O In: 125 [P.O.:120; Other:5] Out: -   General appearance: alert, cooperative and no distress GI: soft, non-tender; bowel sounds normal; no masses,  no organomegaly and incision looks good colostomy working well, Drain serous/cloudy fluid.  Lab Results:   Basename 03/11/11 1155  WBC 13.1*  HGB 8.5*  HCT 27.6*  PLT 453*    BMET  Basename 03/11/11 1155  NA 138  K 4.2  CL 101  CO2 24  GLUCOSE 122*  BUN 35*  CREATININE 4.67*  CALCIUM 9.4   PT/INR No results found for this basename: LABPROT:2,INR:2 in the last 72 hours   Studies/Results: No results found.  Anti-infectives: Anti-infectives     Start     Dose/Rate Route Frequency Ordered Stop   03/11/11 0800   ciprofloxacin (CIPRO) tablet 500 mg  Status:  Discontinued        500 mg Oral 2 times daily 03/11/11 0752 03/11/11 0754   03/11/11 0800   ciprofloxacin (CIPRO) tablet 500 mg        500 mg Oral Daily with breakfast 03/11/11 0754     03/09/11 2000   ertapenem (INVANZ) 0.5 g in sodium chloride 0.9 % 50 mL IVPB  Status:  Discontinued        500 mg 100 mL/hr over 30 Minutes Intravenous Every 24 hours 03/09/11 1702 03/11/11 0752   03/09/11 1800   ertapenem (INVANZ) 0.5 g in sodium chloride 0.9 % 50 mL IVPB  Status:  Discontinued        500 mg 100 mL/hr over 30 Minutes Intravenous Every 24 hours 03/09/11 1658 03/09/11 1702   03/08/11 1100   metroNIDAZOLE (FLAGYL) IVPB 500 mg  Status:  Discontinued        500 mg 100 mL/hr over 60 Minutes  Intravenous Every 8 hours 03/08/11 1036 03/11/11 0752   03/03/11 1200   ertapenem (INVANZ) 0.5 g in sodium chloride 0.9 % 50 mL IVPB  Status:  Discontinued        500 mg 100 mL/hr over 30 Minutes Intravenous Every 24 hours 03/03/11 1108 03/09/11 1658   03/03/11 1045   ertapenem (INVANZ) 1 g in sodium chloride 0.9 % 50 mL IVPB  Status:  Discontinued        1 g 100 mL/hr over 30 Minutes Intravenous Every 24 hours 03/03/11 1043 03/03/11 1107         Current Facility-Administered Medications  Medication Dose Route Frequency Provider Last Rate Last Dose  . 0.9 %  sodium chloride infusion  100 mL Intravenous PRN Sherril Croon, MD      . 0.9 %  sodium chloride infusion  100 mL Intravenous PRN Sherril Croon, MD      . acetaminophen (TYLENOL) tablet 325-650 mg  325-650 mg Oral Q4H PRN Lavon Paganini. [place], PA   650 mg at 03/12/11 0925  . ciprofloxacin (CIPRO) tablet 500 mg  500 mg Oral Q breakfast Meredith Staggers, MD   500 mg at 03/13/11 0830  . darbepoetin (  ARANESP) injection 200 mcg  200 mcg Intravenous Q Thu-HD Tad Moore, PHARMD   200 mcg at 03/07/11 1600  . diphenhydrAMINE (BENADRYL) capsule 25 mg  25 mg Oral Q6H PRN Meredith Staggers, MD      . feeding supplement (NEPRO CARB STEADY) liquid 237 mL  237 mL Oral TID WC Otila Back, PHARMD   237 mL at 03/13/11 1135  . feeding supplement (NEPRO CARB STEADY) liquid 237 mL  237 mL Oral PRN Sherril Croon, MD   237 mL at 03/04/11 0857  . gabapentin (NEURONTIN) capsule 100 mg  100 mg Oral Daily Meredith Staggers, MD      . gabapentin (NEURONTIN) capsule 200 mg  200 mg Oral QHS Meredith Staggers, MD      . heparin injection 1,000 Units  1,000 Units Dialysis PRN Sherril Croon, MD      . heparin injection 2,100 Units  40 Units/kg Dialysis PRN Clayborne Dana. Posey Pronto, MD      . lidocaine-prilocaine (EMLA) cream 1 application  1 application Topical PRN Sherril Croon, MD      . megestrol (MEGACE) 400 MG/10ML suspension 200 mg  200 mg Oral BID Foye Clock, PA   200 mg at 03/13/11 0831  . metoprolol (LOPRESSOR) injection 5 mg  5 mg Intravenous Q6H PRN Lavon Paganini. [place], [place]      . multivitamin (RENA-VIT) tablet 1 tablet  1 tablet Oral Daily Lavon Paganini. [place], PA   1 tablet at 03/13/11 0830  . oxyCODONE-acetaminophen (PERCOCET) 5-325 MG per tablet 1 tablet  1 tablet Oral BID Meredith Staggers, MD   1 tablet at 03/13/11 1134  . oxyCODONE-acetaminophen (PERCOCET) 5-325 MG per tablet 1-2 tablet  1-2 tablet Oral Q4H PRN Nyoka Cowden   2 tablet at 03/13/11 1406  . oxyCODONE-acetaminophen (PERCOCET) 5-325 MG per tablet           . pantoprazole (PROTONIX) EC tablet 40 mg  40 mg Oral Q1200 Meredith Staggers, MD   40 mg at 03/13/11 1135  . pentafluoroprop-tetrafluoroeth (GEBAUERS) aerosol 1 application  1 application Topical PRN Sherril Croon, MD      . promethazine (PHENERGAN) tablet 12.5 mg  12.5 mg Oral Q6H PRN Lavon Paganini. [place], PA       Or  . promethazine (PHENERGAN) suppository 12.5 mg  12.5 mg Rectal Q6H PRN Lavon Paganini. [place], PA       Or  . promethazine (PHENERGAN) injection 12.5 mg  12.5 mg Intramuscular Q6H PRN Lavon Paganini. [place], PA   12.5 mg at 03/09/11 1449  . sodium chloride 0.9 % injection 10 mL  10 mL Intracatheter PRN Meredith Staggers, MD   10 mL at 03/12/11 2115  . sorbitol 70 % solution 30 mL  30 mL Oral Daily PRN Lavon Paganini. [place], PA      . DISCONTD: gabapentin (NEURONTIN) capsule 100 mg  100 mg Oral BID Meredith Staggers, MD   100 mg at 03/12/11 1958  . DISCONTD: heparin injection 1,000 Units  20 Units/kg Dialysis PRN Sherril Croon, MD   1,000 Units at 03/12/11 0250    Assessment/Plan Ischemic colitis with exploratory lap, R hemicolectomy and long Hartmans Pouch11/24/1/; with post op bleeding. From Rectum and concern for ischemia of her remaining colon.  Colonoscopy 03/07/11 DR.Buccini No ischemia. CT guided Drainage of RLQ abscess S/p lumbar fusion 10 days prior to abdominal events. Patient Active Problem List    Diagnoses  .  Lumbar stenosis with neurogenic claudication  . Spondylolisthesis of lumbar region  . End stage renal disease on dialysis  . Hypertension  . Unspecified viral hepatitis C without hepatic coma  . Radiculopathy  . Anemia of chronic kidney failure  . Acute ischemic colitis  . Radiculopathy of lumbar region  Plan:  She's doing well, no changes at this time. She is currently on Cipro for E.coli from abscess.  LOS: 16 days    Trung Wenzl 03/13/2011

## 2011-03-13 NOTE — Progress Notes (Signed)
Occupational Therapy Session Note  Patient Details  Name: [name] MRN: [id number] Date of Birth: January 07, 1951  Today's Date: 03/13/2011 Time: 0905-1000 Time Calculation (min): 55 min  Precautions: Precautions Precautions: Back;Fall Precaution Booklet Issued: No Precaution Comments: 12/24 - patient able to recall 3/3 back precautions Required Braces or Orthoses: Yes Spinal Brace: Lumbar corset Restrictions Weight Bearing Restrictions: No  Short Term Goals: OT Short Term Goal 1: Patient will perform transfers at min assist level using adaptive equipment prn OT Short Term Goal 1 - Progress: Progressing toward goal OT Short Term Goal 2: Patient will be able to verbalize and adhere to 3/3 back preacautions independently  OT Short Term Goal 2 - Progress: Met OT Short Term Goal 3: Patient will donn back brace with set-up assist OT Short Term Goal 3 - Progress: Met OT Short Term Goal 4: ADL short term goals = ADL long term goals OT Short Term Goal 4 - Progress: Progressing toward goal  Skilled Therapeutic Interventions/Progress Updates:  Self care retraining to include bath and dress seated EOB.  Focus session on adaptive techniques to improve independence with BADL tasks and decrease pain.  Patient able to cross right LE and lean to left and lay on left side while she bathed her bottom yet requires assist to get down and up from left sidelying.  Patient report great pain in right side/hip when attempt to lean to right to pull up underwear/pants therefore, patient able to come to a quick squat just long enough for this clinician to pull up her underwear from behind.  Patient too tired to attempt this technique therefore NT present to assist with +2 stand while NT pulled up pants.  Pain Pain Assessment Pain Score:   7 ("I just received some medicine") Pain Type: Acute pain;Surgical pain;Neuropathic pain Pain Location: Foot (bilateral) Pain Descriptors: Aching;Burning;Constant (gets  worse with activity specially on right side) Pain Onset: On-going  Therapy/Group: Individual Therapy  Suetta Hoffmeister 03/13/2011, 12:33 PM

## 2011-03-13 NOTE — Progress Notes (Signed)
Patient able to make needs known appropriately, however has some memory deficits. Ileostomy to right upper quadrant patient being assisted by staff with care. JP drain intact being flushed as ordered. Allevyn to buttocks,and abdominal incision with steri strips well approximated. Appetite is improving currently taking megace. Pain being managed with scheduled Percocet, and occasional PRN dose. Will continue with plan of care.

## 2011-03-13 NOTE — Progress Notes (Signed)
Patient ID: [name], female   DOB: 1950-08-01, 60 y.o.   MRN: [id number] Patient ID: [name], female   DOB: 1950/12/13, 60 y.o.   MRN: [id number] Patient ID: [name], female   DOB: 12-Mar-1951, 60 y.o.   MRN: [id number] Patient ID: [name], female   DOB: 05-30-1950, 60 y.o.   MRN: [id number] Patient ID: [name], female   DOB: 1950-12-26, 60 y.o.   MRN: [id number] Patient ID: [name], female   DOB: 06-04-1950, 60 y.o.   MRN: [id number] Subjective/Complaints: Review of Systems  Musculoskeletal: Positive for back pain.  All other systems reviewed and are negative.  Feet remain sore and hypersensitive especially at night.  Objective: Vital Signs: Blood pressure 122/77, pulse 102, temperature 98.9 F (37.2 C), temperature source Oral, resp. rate 18, height 5\' 5"  (1.651 m), weight 50.8 kg (111 lb 15.9 oz), SpO2 98.00%. No results found.  Basename 03/10/11 0500 03/09/11 1359  WBC 11.3* 13.0*  HGB 7.9* 8.1*  HCT 25.7* 26.0*  PLT 428* 513*    Basename 03/10/11 0500 03/09/11 1358  NA 140 141  K 4.2 4.0  CL 102 102  CO2 30 25  GLUCOSE 109* 113*  BUN 19 31*  CREATININE 3.13* 5.28*  CALCIUM 9.1 9.5   CBG (last 3)   Basename 03/09/11 1809  GLUCAP 88    Wt Readings from Last 3 Encounters:  03/11/11 50.8 kg (111 lb 15.9 oz)  03/11/11 50.8 kg (111 lb 15.9 oz)  02/24/11 56.4 kg (124 lb 5.4 oz)    Physical Exam:  General appearance: alert, cooperative and no distress Head: Normocephalic, without obvious abnormality, atraumatic Eyes: conjunctivae/corneas clear. PERRL, EOM's intact. Fundi benign. Ears: normal TM's and external ear canals both ears Nose: Nares normal. Septum midline. Mucosa normal. No drainage or sinus tenderness. Throat: lips, mucosa, and tongue normal; teeth and gums normal Neck: no adenopathy, no carotid bruit, no JVD, supple, symmetrical, trachea midline and thyroid not enlarged, symmetric, no tenderness/mass/nodules  R    IJ in place Back: symmetric, no curvature. ROM normal. No CVA tenderness. Resp: clear to auscultation bilaterally Cardio: regular rate and rhythm, S1, S2 normal, Gr 3/6 SEM  No  click, rub or gallop; rate 90-100 GI: soft, non-tender; bowel sounds normal; no masses,  no organomegaly;  colostomy noted Extremities: extremities normal, atraumatic, no cyanosis or edema slight edema distally only; AV fistula R arm Pulses: 2+ and symmetric Skin: Skin color, texture, turgor normal. No rashes or lesions Neurologic: continued foot drop tr/5 but pt denies sensory changes. Plantarflexion 3-4/5.  Generalized weakness in proximal legs 3/5.  Heel cords loose Incision/Wound: wounds all clean and intact.  Ostomy site sealed.  Saw no blood in stool today. Right thigh less tender, but no gross swelling.  Skin is warm throughout.  CBG (last 3)   Basename 03/09/11 1809  GLUCAP 88    Assessment/Plan: 1. Functional deficits secondary to lumbar stenosis with radiculopathy s/p decompression and ischemic colitis requiring colectomy which require 3+ hours per day of interdisciplinary therapy in a comprehensive inpatient rehab setting. Physiatrist is providing close team supervision and 24 hour management of active medical problems listed below. Physiatrist and rehab team continue to assess barriers to discharge/monitor patient progress toward functional and medical goals.    Mobility: Bed Mobility Bed Mobility: Yes Rolling Right: 4: Min assist;With rail Rolling Right Details (indicate cue type and reason): Cues to bend bil LEs and roll completely before pushign up to sit  Left Sidelying to Sit: 3: Mod assist Left Sidelying to Sit Details (indicate cue type and reason): Increased effort and pain with coming up to sit'  Transfers Transfers: Yes Sit to Stand: 2: Max assist;With upper extremity assist;From chair/3-in-1 Stand to Sit: 2: Max assist;With upper extremity assist;To chair/3-in-1 Squat Pivot Transfers:  3: Mod assist Ambulation/Gait Ambulation/Gait Assistance: 4: Min assist Ambulation/Gait Assistance Details (indicate cue type and reason): A for LE lifting and placing due to bil LE foot drop, A for seaight shifting to unweight opposite LE, blocking knees Ambulation Distance (Feet): 8 Feet Assistive device: Rolling walker Gait Pattern: Decreased dorsiflexion - right;Decreased dorsiflexion - left;Right foot flat;Left foot flat;Right genu recurvatum;Left genu recurvatum;Trunk flexed;Right hip hike Stairs: No (Unsafe t this time) Architect: Yes Wheelchair Assistance: 4: Min Lexicographer: Both upper extremities Wheelchair Parts Management: Supervision/cueing (Cues for maintaining back precautions with parts management) Distance: 150 ADL:   Cognition: Cognition Overall Cognitive Status: Appears within functional limits for tasks assessed Arousal/Alertness: Lethargic Orientation Level: Oriented to person;Oriented to place;Oriented to time Memory: Impaired (forgot date after 1 minute) Awareness: Appears intact Problem Solving: Appears intact Safety/Judgment: Appears intact Cognition Arousal/Alertness: Lethargic Orientation Level: Oriented to person;Oriented to place;Oriented to time  1.Lumbar stenosis with radiculopathy-S/P laminectomy Lumbar 4-5 and L 5 S1 11/14.Back corset when out of bed.Routine back precautions. Consider b/l AFO's for gait, but with edema presently, i'm not anxious to order. Edema control. Probably will try AFO's as an outpt.  2. DVT Prophylaxis/Anticoagulation: SCD,s/ and support hose. Dopplers negative  3. Pain Management: percocet.prn--- will resume low dose neurontin as i doubt this was at the core of AMS and because foot pain is becoming a real problem again.  Observe mental status closely. Will increase hs neurontin toinight.  4.Ischemic Colitis-S/P exp lap with right hemicolectomy and ileostomy 11/24.Provide  ileostomy care and education. She is doing some ostomy care already on her own.   -wbc's improving.  Converted to cipro yesterday which will cover e coli.  -patient show continued clinical improvement  -drain per surgery and IR. Drainage decreasing.  5. ESRD: dialysis as directed per renal services.Weigh patient daily.Follow up labs with dialysis.    6Chronic anemia-aranesp with HD.Follow up CBC with HD and transfuse if needed.   7.HTN-No present meds and monitor with increased activity.   8. AMS:  Much better with rx of above.      LOS (Days) 14 A FACE TO FACE EVALUATION WAS PERFORMED  SWARTZ,ZACHARY T 03/11/2011, 7:34 AM

## 2011-03-13 NOTE — Progress Notes (Signed)
Physical Therapy Weekly Progress Note  Patient Details  Name: KATHRYNN EAR MRN: [id number] Date of Birth: [birth year]  Today's Date: 03/13/2011 Time: 1300-1400 Time Calculation (min): 60 min  Pt has been on/off unit during the last week due to surgical and radiology interventions and therefore has had limited participation in therapies. Pt has demonstrated a decline in functional status and still has periods of confusion. Pt is requiring overall min/modA for transfers, fluctuating on pt's level of fatigue, pain, and general weakness. Pt is unable to maintain standing and has initiated working in standing frame for postural control and weightbearing through lower extremities with noted hip weakness and bilateral knees buckling. Pt is unable to tolerate gait at this time due to weakness, pain, and fatigue. Pt still presents with lower extremity edema and also presents with LUE edema. Pt is requiring encouragement to participate in therapies in order to make progress to go home. Pt states her daughter is the one who will be present to assist at home, and will need her to come in for family education prior to d/c.  Patient continues to demonstrate the following deficits: decreased balance, decreased postural control, generalized muscle weakness, decreased activity tolerance, edema, pain and therefore will continue to benefit from skilled PT intervention to enhance overall performance with activity tolerance, balance, postural control, ability to compensate for deficits, functional use of  right upper extremity, right lower extremity, left upper extremity and left lower extremity and coordination.  Patient progressing toward long term goals..  Continue plan of care. Goals were downgraded last week due to pt's status, will continue to monitor. Anticipating w/c level overall supervision with family to bump w/c up step for home entry.  PT Short Term Goals PT Short Term Goal 1: Pt min A functional  transfers PT Short Term Goal 1 - Progress: Progressing toward goal (pt inconsistant with level of assist needed) PT Short Term Goal 2: Pt min A gait 50' in controlled environment PT Short Term Goal 2 - Progress: Not met (No gait progress at this current time due to weakness) PT Short Term Goal 3: Pt propell WC 150' in community Miod I with parts management PT Short Term Goal 3 - Progress: Progressing toward goal (overall supervision level)  Therapy Documentation Precautions: Fall and Back precautions  Skilled Therapeutic Interventions/Progress Updates:  Treatment session (1300-1400, 60 minutes) focused on transfers, squat pivot to/from w/c to/from mat, overall steady assist. Practiced scooting edge of mat for technique to right and left, easier for pt to transfer to the R versus left. Demonstrated standing frame to pt to attempt standing for weightbearing, postural control, and standing tolerance, pt stated she was afraid of falling but with encouragement agreed to try standing frame. Able to tolerate up to 2 minutes in standing frame at a time, initially complaint of a little dizziness which resolved and complaint of pain in abdomen when in upright posture, therapeutic activity while in standing frame to stack cones using right upper extremity mostly, limited weight shift noted, manual facilitation for posture (lateral lean to right), bilateral knees buckling (Left more often than right). W/c propulsion with supervision on unit, rest breaks needed due to fatigue. Encouraged pt to continue to participate in afternoon therapy session after stating she wasn't going to do her last occupational therapy because she "didn't feel like it." Reminded her that she needs to participate in order to be able to go home. Verbalized understanding.       Mobility Transfers Transfers: Yes Sit to  Stand: 1: +2 Total assist;Other (comment) (standing frame) Squat Pivot Transfers: 3: Mod assist;4: Min assist (fluctuates  from min/modA) Squat Pivot Transfer Details: Verbal cues for technique;Tactile cues for posture;Tactile cues for weight shifting;Verbal cues for precautions/safety     Therapy/Group: Individual Therapy  Canary Brim [facility] 03/13/2011, 3:29 PM

## 2011-03-14 ENCOUNTER — Inpatient Hospital Stay (HOSPITAL_COMMUNITY): Payer: Medicare Other

## 2011-03-14 LAB — RENAL FUNCTION PANEL
CO2: 25 mEq/L (ref 19–32)
Calcium: 8.8 mg/dL (ref 8.4–10.5)
Chloride: 97 mEq/L (ref 96–112)
GFR calc Af Amer: 9 mL/min — ABNORMAL LOW (ref >90)
GFR calc non Af Amer: 8 mL/min — ABNORMAL LOW (ref >90)
Potassium: 4.5 mEq/L (ref 3.5–5.1)
Sodium: 134 mEq/L — ABNORMAL LOW (ref 135–145)

## 2011-03-14 LAB — CBC
Platelets: 379 10*3/uL (ref 150–400)
RBC: 2.78 MIL/uL — ABNORMAL LOW (ref 3.87–5.11)
WBC: 16.3 10*3/uL — ABNORMAL HIGH (ref 4.0–10.5)

## 2011-03-14 LAB — GLUCOSE, CAPILLARY
Glucose-Capillary: 118 mg/dL — ABNORMAL HIGH (ref 70–99)
Glucose-Capillary: 94 mg/dL (ref 70–99)

## 2011-03-14 MED ORDER — SODIUM CHLORIDE 0.9 % IV SOLN
0.1250 ug/kg/min | INTRAVENOUS | Status: DC
  Filled 2011-03-14: qty 3.6

## 2011-03-14 MED ORDER — OXYCODONE-ACETAMINOPHEN 5-325 MG PO TABS
ORAL_TABLET | ORAL | Status: AC
  Administered 2011-03-14: 2 via ORAL
  Filled 2011-03-14: qty 2

## 2011-03-14 MED ORDER — ABCIXIMAB 2 MG/ML IV SOLN
0.2500 mg/kg | Freq: Once | INTRAVENOUS | Status: DC
  Filled 2011-03-14: qty 10

## 2011-03-14 MED ORDER — DARBEPOETIN ALFA-POLYSORBATE 200 MCG/0.4ML IJ SOLN
INTRAMUSCULAR | Status: AC
  Administered 2011-03-14: 200 ug via INTRAVENOUS
  Filled 2011-03-14: qty 0.4

## 2011-03-14 NOTE — Patient Care Conference (Signed)
Inpatient RehabilitationTeam Conference Note Date: 03/14/2011   Time: 6:12 PM    Patient Name: [name]      Medical Record Number: [id number]  Date of Birth: 01-12-1951 Sex: Female         Room/Bed: 4006/4006-01 Payor Info: Payor: MEDICARE  Plan: MEDICARE PART A AND B  Product Type: *No Product type*     Admitting Diagnosis: LUMBAR FUSION,HEMICOLECTOMY, ESRD Abdominal pain and rectal bleeding, followup ischemia of colon  Admit Date/Time:  02/25/2011  3:33 PM Admission Comments: No comment available   Primary Diagnosis:  Radiculopathy of lumbar region Principal Problem: Radiculopathy of lumbar region  Patient Active Problem List  Diagnoses Date Noted  . Radiculopathy of lumbar region 02/26/2011  . Anemia of chronic kidney failure 02/09/2011  . Acute ischemic colitis 02/09/2011  . Radiculopathy 02/01/2011  . Lumbar stenosis with neurogenic claudication 01/30/2011  . Spondylolisthesis of lumbar region 01/30/2011  . End stage renal disease on dialysis 01/30/2011  . Hypertension 01/30/2011  . Unspecified viral hepatitis C without hepatic coma 01/30/2011    Expected Discharge Date: Expected Discharge Date: 03/21/11 (after HD)  Team Members Present: Physician: Dr. Alger Simons Case Manager Present: Lutricia Feil, RN Social Worker Present: Lennart Pall, LCSW PT Present: Canary Brim, PT OT Present: Forde Radon, OT;Roanna Epley, COTA RN Present: Adelfa Koh    Current Status/Progress Goal Weekly Team Focus  Medical   more sedated, sleep and late HD effects.  medication effects also. on cipro for ecoli infection  increased arousal and ability to participate in therapy  brace wear/need given ostomy   Bowel/Bladder   HD anuria, and ileostomy  Mod I with ostomy care  Patient currently being educated on ostomy care, needs continued education   Swallow/Nutrition/ Hydration             ADL's   UB ADL set up. LB ADL Mod A. increased difficualty with transfers.  need to  downgrade goals to S and assess min A for LB  ADL retraining. transfers   Mobility   fluctuates transfer level min/modA, standing frame for standing tolerance at this time, supervision w/c mobility  supervision overall w/c level, minA car and minA gait x 15'  activity tolerance, functional transfers, family ed with daughter   Communication             Safety/Cognition/ Behavioral Observations            Pain   scheduled percocet at 0700 and 1200  Pain managed with scheduled pain medication  Assess pain medication and cognition   Skin   Abdominal incision with steri strips, lumbar incision healed ota, ileostomy to RUQ,  dressing to right flank with dressing and JP drain, and stage 2 with allevyn   No additional skin breakdown  Continue with education, assisting with q 2 turns,and floating heels on pillows      *See Interdisciplinary Assessment and Plan and progress notes for long and short-term goals  Barriers to Discharge: arousal, safety, pain    Possible Resolutions to Barriers:  medication modification, emotional support, med/surgical mgt    Discharge Planning/Teaching Needs:  home with daughter to provide 24/7; family ed to be scheduled      Team Discussion: Regular sleep schedule has been disrupted by very late night HD sessions.  Infection is resolving.  Pt wants to go home.  Need daughter in for family ed.   Revisions to Treatment Plan: none    Continued Need for Acute Rehabilitation Level of Care:  The patient requires daily medical management by a physician with specialized training in physical medicine and rehabilitation for the following conditions: Daily direction of a multidisciplinary physical rehabilitation program to ensure safe treatment while eliciting the highest outcome that is of practical value to the patient.: Yes Daily medical management of patient stability for increased activity during participation in an intensive rehabilitation regime.: Yes Daily  analysis of laboratory values and/or radiology reports with any subsequent need for medication adjustment of medical intervention for : Post surgical problems;Neurological problems;Other  Theora Master 03/14/2011, 6:12 PM

## 2011-03-14 NOTE — Progress Notes (Signed)
Physical Therapy Note  Patient Details  Name: JANYLL HIMMELMAN MRN: [id number] Date of Birth: [birth year] Today's Date: 03/14/2011  Individual therapy 830-900 (30 minutes). Pt appears clearer this AM, still disoriented to day of the week and says she doesn't really remember what she did yesterday. With cueing, she was able to recall a little of therapy sessions. recalls 2/3 back precautions. Bed mobility with rails and HOB elevated with supervision, poor balance noted but pt able to correct herself using rails during dynamic balance EOB to eat breakfast, decreased coordination with peeling egg and spilled coffee on tray noted. Heavy ModA squat pivot transfer from bed to chair, pt stating she feels like she weighs a ton. Will try to work on [place] transfers this afternoon due to inconsistency with transfers  Allayne Gitelman 03/14/2011, 9:22 AM

## 2011-03-14 NOTE — Progress Notes (Signed)
Physical Therapy Note  Patient Details  Name: [name] MRN: [id number] Date of Birth: 12-10-1950 Today's Date: 03/14/2011  Individual therapy 1300-1358 (61 min) Initially denies pain but as session progressed, complained of abdominal and right flank pain - notified RN for pain medicine. Practiced squat pivot transfers in ADL apartment to regular bed, pt stated this is similar to her bed at home, required minA overall and cues for safety. Attempted to practice supine <-> sit but pt complaining it hurt too much and stated her daughter will just help her lift her legs up (declined use of adaptive equipment). Pt stated she wanted to try walking and asked why we haven't done that, reminded pt that she has not been able to stand up to attempt walking. Attempted sit to stands with RW from elevated mat, pt able to lift bottom off mat x 3 reps but unable to reach full standing position and complained it made her abdomen hurt. Kinetron on least resistance to work on lower extremity functional strengthening x 5 minutes with rest breaks. W/c propulsion on unit x 150' with supervision for general activity tolerance.    Canary Brim [facility] 03/14/2011, 3:57 PM

## 2011-03-14 NOTE — Progress Notes (Signed)
Martyn Timme O. Bearett Porcaro, III, MD, FACS [phone]--pager [phone]--office [facility]  

## 2011-03-14 NOTE — Progress Notes (Signed)
7 Days Post-Op  Subjective: Doing well. I noticed her L arm is swollen, she says it's not new, but I didn't notice it yesterday. Will defer to Rehab.  Objective: Vital signs in last 24 hours: Temp:  [98.7 F (37.1 C)-99.3 F (37.4 C)] 99.3 F (37.4 C) (12/27 [id number]) Pulse Rate:  [102-108] 108  (12/27 0613) Resp:  [16-20] 16  (12/27 [id number]) BP: (112-125)/(20-82) 125/82 mmHg (12/27 0613) SpO2:  [97 %-100 %] 97 % (12/27 [id number]) Weight:  [55.8 kg (123 lb 0.3 oz)-56.3 kg (124 lb 1.9 oz)] 123 lb 0.3 oz (55.8 kg) (12/27 [id number]) Last BM Date: 03/13/11  Intake/Output from previous day: 12/26 0701 - 12/27 0700 In: 125 [P.O.:120] Out: -  Intake/Output this shift:    General appearance: alert, cooperative and no distress GI: soft, non-tender; bowel sounds normal; no masses,  no organomegaly and incision looks good colostomy working well, Drain serous/cloudy fluid. Left arm and hand swelling. Functional graft RUE for HD.  Lab Results:   Basename 03/11/11 1155  WBC 13.1*  HGB 8.5*  HCT 27.6*  PLT 453*    BMET  Basename 03/11/11 1155  NA 138  K 4.2  CL 101  CO2 24  GLUCOSE 122*  BUN 35*  CREATININE 4.67*  CALCIUM 9.4   PT/INR No results found for this basename: LABPROT:2,INR:2 in the last 72 hours   Studies/Results: No results found.  Anti-infectives: Anti-infectives     Start     Dose/Rate Route Frequency Ordered Stop   03/11/11 0800   ciprofloxacin (CIPRO) tablet 500 mg  Status:  Discontinued        500 mg Oral 2 times daily 03/11/11 0752 03/11/11 0754   03/11/11 0800   ciprofloxacin (CIPRO) tablet 500 mg        500 mg Oral Daily with breakfast 03/11/11 0754     03/09/11 2000   ertapenem (INVANZ) 0.5 g in sodium chloride 0.9 % 50 mL IVPB  Status:  Discontinued        500 mg 100 mL/hr over 30 Minutes Intravenous Every 24 hours 03/09/11 1702 03/11/11 0752   03/09/11 1800   ertapenem (INVANZ) 0.5 g in sodium chloride 0.9 % 50 mL IVPB  Status:  Discontinued        500  mg 100 mL/hr over 30 Minutes Intravenous Every 24 hours 03/09/11 1658 03/09/11 1702   03/08/11 1100   metroNIDAZOLE (FLAGYL) IVPB 500 mg  Status:  Discontinued        500 mg 100 mL/hr over 60 Minutes Intravenous Every 8 hours 03/08/11 1036 03/11/11 0752   03/03/11 1200   ertapenem (INVANZ) 0.5 g in sodium chloride 0.9 % 50 mL IVPB  Status:  Discontinued        500 mg 100 mL/hr over 30 Minutes Intravenous Every 24 hours 03/03/11 1108 03/09/11 1658   03/03/11 1045   ertapenem (INVANZ) 1 g in sodium chloride 0.9 % 50 mL IVPB  Status:  Discontinued        1 g 100 mL/hr over 30 Minutes Intravenous Every 24 hours 03/03/11 1043 03/03/11 1107         Current Facility-Administered Medications  Medication Dose Route Frequency Provider Last Rate Last Dose  . 0.9 %  sodium chloride infusion  100 mL Intravenous PRN Sherril Croon, MD      . 0.9 %  sodium chloride infusion  100 mL Intravenous PRN Sherril Croon, MD      . acetaminophen (TYLENOL) tablet [phone]  mg  325-650 mg Oral Q4H PRN Lavon Paganini. [place], PA   650 mg at 03/12/11 0925  . ciprofloxacin (CIPRO) tablet 500 mg  500 mg Oral Q breakfast Meredith Staggers, MD   500 mg at 03/13/11 0830  . darbepoetin (ARANESP) injection 200 mcg  200 mcg Intravenous Q Thu-HD Tad Moore, PHARMD   200 mcg at 03/07/11 1600  . diphenhydrAMINE (BENADRYL) capsule 25 mg  25 mg Oral Q6H PRN Meredith Staggers, MD      . feeding supplement (NEPRO CARB STEADY) liquid 237 mL  237 mL Oral TID WC Otila Back, PHARMD   237 mL at 03/13/11 1135  . feeding supplement (NEPRO CARB STEADY) liquid 237 mL  237 mL Oral PRN Sherril Croon, MD   237 mL at 03/04/11 0857  . gabapentin (NEURONTIN) capsule 100 mg  100 mg Oral Daily Meredith Staggers, MD      . gabapentin (NEURONTIN) capsule 200 mg  200 mg Oral QHS Meredith Staggers, MD   200 mg at 03/13/11 2118  . heparin injection 1,000 Units  1,000 Units Dialysis PRN Sherril Croon, MD      . heparin injection 2,100 Units   40 Units/kg Dialysis PRN Clayborne Dana. Posey Pronto, MD      . lidocaine-prilocaine (EMLA) cream 1 application  1 application Topical PRN Sherril Croon, MD      . megestrol (MEGACE) 400 MG/10ML suspension 200 mg  200 mg Oral BID Foye Clock, PA   200 mg at 03/13/11 1956  . metoprolol (LOPRESSOR) injection 5 mg  5 mg Intravenous Q6H PRN Lavon Paganini. [place], PA      . multivitamin (RENA-VIT) tablet 1 tablet  1 tablet Oral Daily Lavon Paganini. [place], PA   1 tablet at 03/13/11 0830  . oxyCODONE-acetaminophen (PERCOCET) 5-325 MG per tablet 1 tablet  1 tablet Oral BID Meredith Staggers, MD   1 tablet at 03/14/11 [id number]  . oxyCODONE-acetaminophen (PERCOCET) 5-325 MG per tablet 1-2 tablet  1-2 tablet Oral Q4H PRN Nyoka Cowden   2 tablet at 03/13/11 1406  . pantoprazole (PROTONIX) EC tablet 40 mg  40 mg Oral Q1200 Meredith Staggers, MD   40 mg at 03/13/11 1135  . pentafluoroprop-tetrafluoroeth (GEBAUERS) aerosol 1 application  1 application Topical PRN Sherril Croon, MD      . promethazine (PHENERGAN) tablet 12.5 mg  12.5 mg Oral Q6H PRN Lavon Paganini. [place], PA       Or  . promethazine (PHENERGAN) suppository 12.5 mg  12.5 mg Rectal Q6H PRN Lavon Paganini. [place], PA       Or  . promethazine (PHENERGAN) injection 12.5 mg  12.5 mg Intramuscular Q6H PRN Lavon Paganini. [place], PA   12.5 mg at 03/09/11 1449  . sodium chloride 0.9 % injection 10 mL  10 mL Intracatheter PRN Meredith Staggers, MD   10 mL at 03/12/11 2115  . sorbitol 70 % solution 30 mL  30 mL Oral Daily PRN Lavon Paganini. [place], PA        Assessment/Plan Ischemic colitis with exploratory lap, R hemicolectomy and long [place] Pouch11/24/1/; with post op bleeding. From Rectum and concern for ischemia of her remaining colon.  Colonoscopy 03/07/11 DR.Buccini No ischemia. CT guided Drainage of RLQ abscess 03/08/11. S/p lumbar fusion 10 days prior to abdominal events. Patient Active Problem List  Diagnoses  . Lumbar stenosis with neurogenic claudication  .  Spondylolisthesis of lumbar region  . End  stage renal disease on dialysis  . Hypertension  . Unspecified viral hepatitis C without hepatic coma  . Radiculopathy  . Anemia of chronic kidney failure  . Acute ischemic colitis  . Radiculopathy of lumbar region  Plan:  She's doing well, no changes at this time. She is currently on Cipro for E.coli from abscess. Discuss when to repeat CT.  Probably nest week.   LOS: 17 days    Jerzie Bieri 03/14/2011

## 2011-03-14 NOTE — Progress Notes (Signed)
No changes.  CPM  Kathryne Eriksson. Dahlia Bailiff, MD, [place] [phone] [facility]

## 2011-03-14 NOTE — Progress Notes (Signed)
Subjective:   Sitting in chair comfortably, no complaints, eating well  Rolling herself in her wheelchair with PT Objective: Vital signs in last 24 hours: Temp:  [98.7 F (37.1 C)-99.3 F (37.4 C)] 99.3 F (37.4 C) (12/27 0613) Pulse Rate:  [102-108] 108  (12/27 0613) Resp:  [16-20] 16  (12/27 0613) BP: (112-125)/(20-82) 125/82 mmHg (12/27 0613) SpO2:  [97 %-100 %] 97 % (12/27 0613) Weight:  [55.8 kg (123 lb 0.3 oz)-56.3 kg (124 lb 1.9 oz)] 123 lb 0.3 oz (55.8 kg) (12/27 [id number]) Weight change: 4.4 kg (9 lb 11.2 oz)  Intake/Output from previous day: 12/26 0701 - 12/27 0700 In: 125 [P.O.:120] Out: -  Intake/Output this shift: Total I/O In: 240 [P.O.:240] Out: 275 [Stool:275]  EXAM: General appearance:  Alert, comfortable in chair, in no apparent distress  IJ line in neck Resp:  CTA bilaterally Cardio:  Tachycardic with Gr II/IV systolic murmur GI:  Wearing brace and in wheelchair so can't examine abdomen but am told colostomy working well; has JP drain but unclear what the output is Extremities:  No edema Access:  AVG @ [facility]    Lab Results:  Basename 03/11/11 1155  WBC 13.1*  HGB 8.5*  HCT 27.6*  PLT 453*   BMET:  Basename 03/11/11 1155  NA 138  K 4.2  CL 101  CO2 24  GLUCOSE 122*  BUN 35*  CREATININE 4.67*  CALCIUM 9.4  ALBUMIN 2.0*   No results found for this basename: PTH:2 in the last 72 hours Iron Studies:  Basename 03/11/11 1155  IRON 37*  TIBC 117*  TRANSFERRIN --  FERRITIN 2041*   Assessment/Plan: 1.  ESRD - on HD on TTS @ [facility], stable with K of 4.2 and no signs or symptoms of fluid overload.  HD pending today. 2. Abdominal abscess (after hemicolectomy surgery for ischemic bowel with ileostomy creation 02/09/11) - Right flank drain placed 12/21, body fluid culture from her drained intra-abdominal abscess showed E. Coli, currently on narrowed antibiotic spectrum with ciprofloxacin. She is afebrile but still remains tachycardic. Clinically she is much  better.; ileostomy by report draining well; not sure what is being drained from [place] as not really recorded 3. Anemia - Hgb low at 8.5, on Aranesp 200 mcg on Thurs. Pending today with HD. 4. Secondary hyperparathyroidism - Off binders, low P.  5. Nutrition - on Nepro to supplement oral intake.  6. Lumbar radiculopathy: Management per inpatient rehabilitation service.  7. Bilateral lower extremity pain: On gabapentin 100 mg twice a day for what seems to be neuropathic leg pain. Continue to monitor while on rehabilitation. (surgery with lumbar decompression by Dr. Sherwood Gambler was on 01/30/11 = original reason for hospital admission)     LOS: 17 days   [name],CHARLES 03/14/2011,11:07 AM   Agree with plan with above highlighted additions. Jasn Xia B,MD 03/14/2011 1:11 PM

## 2011-03-14 NOTE — Consult Note (Signed)
Ostomy follow-up:  Supplies at bedside for staff use.  Bedside nurse applied new pouch yesterday, intact with good seal.  Pt has reviewed pouch application and emptying several times to prepare for discharge with family present for sessions.  Has been placed on [facility] discharge program.  Denies further questions at this time. Will not plan to follow further unless re-consulted.  [address] [place], MSN, [place]

## 2011-03-14 NOTE — Progress Notes (Signed)
Physical Therapy Note  Patient Details  Name: [name] MRN: [id number] Date of Birth: 05/03/1950 Today's Date: 03/14/2011  1455-1535 (40 minutes) individual treatment session Pain- bilateral posterior thigh pain- unrated- premedicated    Focus of treatment: car transfer training; therapeutic activities to improve tolerance to activity Treatment: Car transfer with sliding board min assist with setup (removing legrests and vcs to remove armrest prior to transfer); SBA with transfer; min assist LEs in/out of car Nustep (crosstrainer) Level 2  2X 5 minutes using bilateral extremities; Transfer setup + SBA  Sliding board   Aquanetta Schwarz,JIM 03/14/2011, 3:35 PM

## 2011-03-14 NOTE — Progress Notes (Signed)
  Subjective: Doing well. Still having pain - mostly when working with PT.   Objective: Vital signs in last 24 hours: Temp:  [98.7 F (37.1 C)-99.3 F (37.4 C)] 99.3 F (37.4 C) (12/27 [id number]) Pulse Rate:  [102-108] 108  (12/27 0613) Resp:  [16-20] 16  (12/27 0613) BP: (112-125)/(20-82) 125/82 mmHg (12/27 0613) SpO2:  [97 %-100 %] 97 % (12/27 0613) Weight:  [123 lb 0.3 oz (55.8 kg)-124 lb 1.9 oz (56.3 kg)] 123 lb 0.3 oz (55.8 kg) (12/27 0613) Last BM Date: 03/13/11  Intake/Output from previous day: 12/26 0701 - 12/27 0700 In: 125 [P.O.:120] Out: -  Intake/Output this shift:  Drain output from 12/26 or today not recorded.  Last recording was from 02/2511 of 63ml. ??? Flushing being performed.    Physical exam :  Drain intact with purulent drainage.  Flushed with 77ml without pain or leakage around catheter insertion site.  Abd. Soft with colostomy site clean and dry.   Lab Results:   Basename 03/11/11 1155  WBC 13.1*  HGB 8.5*  HCT 27.6*  PLT 453*   BMET  Basename 03/11/11 1155  NA 138  K 4.2  CL 101  CO2 24  GLUCOSE 122*  BUN 35*  CREATININE 4.67*  CALCIUM 9.4   Studies/Results: e. Coli on cultures from fluid   Anti-infectives: Anti-infectives     Start     Dose/Rate Route Frequency Ordered Stop   03/11/11 0800   ciprofloxacin (CIPRO) tablet 500 mg  Status:  Discontinued        500 mg Oral 2 times daily 03/11/11 0752 03/11/11 0754   03/11/11 0800   ciprofloxacin (CIPRO) tablet 500 mg        500 mg Oral Daily with breakfast 03/11/11 0754     03/09/11 2000   ertapenem (INVANZ) 0.5 g in sodium chloride 0.9 % 50 mL IVPB  Status:  Discontinued        500 mg 100 mL/hr over 30 Minutes Intravenous Every 24 hours 03/09/11 1702 03/11/11 0752   03/09/11 1800   ertapenem (INVANZ) 0.5 g in sodium chloride 0.9 % 50 mL IVPB  Status:  Discontinued        500 mg 100 mL/hr over 30 Minutes Intravenous Every 24 hours 03/09/11 1658 03/09/11 1702   03/08/11 1100    metroNIDAZOLE (FLAGYL) IVPB 500 mg  Status:  Discontinued        500 mg 100 mL/hr over 60 Minutes Intravenous Every 8 hours 03/08/11 1036 03/11/11 0752   03/03/11 1200   ertapenem (INVANZ) 0.5 g in sodium chloride 0.9 % 50 mL IVPB  Status:  Discontinued        500 mg 100 mL/hr over 30 Minutes Intravenous Every 24 hours 03/03/11 1108 03/09/11 1658   03/03/11 1045   ertapenem (INVANZ) 1 g in sodium chloride 0.9 % 50 mL IVPB  Status:  Discontinued        1 g 100 mL/hr over 30 Minutes Intravenous Every 24 hours 03/03/11 1043 03/03/11 1107          Assessment/Plan: s/p Procedure(s): right psoas abscess drain placed 03/08/11.  Still with purulent drainage; uncertain of output.  Will talk with RN regarding flushes and recordings of output from drain to determine when appropriate to perform F/U CT.    CAMPBELL,PAMELA D 03/14/2011

## 2011-03-14 NOTE — Progress Notes (Signed)
Subjective/Complaints: Review of Systems  Musculoskeletal: Positive for back pain.  All other systems reviewed and are negative.  Feet remain sore and hypersensitive especially at night.  Objective: Vital Signs: Blood pressure 122/77, pulse 102, temperature 98.9 F (37.2 C), temperature source Oral, resp. rate 18, height 5\' 5"  (1.651 m), weight 50.8 kg (111 lb 15.9 oz), SpO2 98.00%. No results found.  Basename 03/10/11 0500 03/09/11 1359  WBC 11.3* 13.0*  HGB 7.9* 8.1*  HCT 25.7* 26.0*  PLT 428* 513*    Basename 03/10/11 0500 03/09/11 1358  NA 140 141  K 4.2 4.0  CL 102 102  CO2 30 25  GLUCOSE 109* 113*  BUN 19 31*  CREATININE 3.13* 5.28*  CALCIUM 9.1 9.5   CBG (last 3)   Basename 03/09/11 1809  GLUCAP 88    Wt Readings from Last 3 Encounters:  03/11/11 50.8 kg (111 lb 15.9 oz)  03/11/11 50.8 kg (111 lb 15.9 oz)  02/24/11 56.4 kg (124 lb 5.4 oz)    Physical Exam:  General appearance: alert, cooperative and no distress Head: Normocephalic, without obvious abnormality, atraumatic Eyes: conjunctivae/corneas clear. PERRL, EOM's intact. Fundi benign. Ears: normal TM's and external ear canals both ears Nose: Nares normal. Septum midline. Mucosa normal. No drainage or sinus tenderness. Throat: lips, mucosa, and tongue normal; teeth and gums normal Neck: no adenopathy, no carotid bruit, no JVD, supple, symmetrical, trachea midline and thyroid not enlarged, symmetric, no tenderness/mass/nodules  R   IJ in place Back: symmetric, no curvature. ROM normal. No CVA tenderness. Resp: clear to auscultation bilaterally Cardio: regular rate and rhythm, S1, S2 normal, Gr 3/6 SEM  No  click, rub or gallop; rate 90-100 GI: soft, non-tender; bowel sounds normal; no masses,  no organomegaly;  colostomy noted Extremities: extremities normal, atraumatic, no cyanosis or edema slight edema distally only; AV fistula R arm Pulses: 2+ and symmetric Skin: Skin color, texture, turgor normal.  No rashes or lesions Neurologic: continued foot drop tr/5 but pt denies sensory changes. Plantarflexion 3-4/5.  Generalized weakness in proximal legs 3/5.  Heel cords loose.  Remains slow to arouse in the early am hours.  Incision/Wound: wounds all clean and intact.  Ostomy site sealed.   Right thigh less tender, but no gross swelling.  Skin is warm throughout.  CBG (last 3)   Basename 03/09/11 1809  GLUCAP 88    Assessment/Plan: 1. Functional deficits secondary to lumbar stenosis with radiculopathy s/p decompression and ischemic colitis requiring colectomy which require 3+ hours per day of interdisciplinary therapy in a comprehensive inpatient rehab setting. Physiatrist is providing close team supervision and 24 hour management of active medical problems listed below. Physiatrist and rehab team continue to assess barriers to discharge/monitor patient progress toward functional and medical goals.    Mobility: Bed Mobility Bed Mobility: Yes Rolling Right: 4: Min assist;With rail Rolling Right Details (indicate cue type and reason): Cues to bend bil LEs and roll completely before pushign up to sit Left Sidelying to Sit: 3: Mod assist Left Sidelying to Sit Details (indicate cue type and reason): Increased effort and pain with coming up to sit'  Transfers Transfers: Yes Sit to Stand: 2: Max assist;With upper extremity assist;From chair/3-in-1 Stand to Sit: 2: Max assist;With upper extremity assist;To chair/3-in-1 Squat Pivot Transfers: 3: Mod assist Ambulation/Gait Ambulation/Gait Assistance: 4: Min assist Ambulation/Gait Assistance Details (indicate cue type and reason): A for LE lifting and placing due to bil LE foot drop, A for seaight shifting to unweight opposite LE,  blocking knees Ambulation Distance (Feet): 8 Feet Assistive device: Rolling walker Gait Pattern: Decreased dorsiflexion - right;Decreased dorsiflexion - left;Right foot flat;Left foot flat;Right genu recurvatum;Left  genu recurvatum;Trunk flexed;Right hip hike Stairs: No (Unsafe t this time) Architect: Yes Wheelchair Assistance: 4: Min Lexicographer: Both upper extremities Wheelchair Parts Management: Supervision/cueing (Cues for maintaining back precautions with parts management) Distance: 150 ADL:   Cognition: Cognition Overall Cognitive Status: Appears within functional limits for tasks assessed Arousal/Alertness: Lethargic Orientation Level: Oriented to person;Oriented to place;Oriented to time Memory: Impaired (forgot date after 1 minute) Awareness: Appears intact Problem Solving: Appears intact Safety/Judgment: Appears intact Cognition Arousal/Alertness: Lethargic Orientation Level: Oriented to person;Oriented to place;Oriented to time  1.Lumbar stenosis with radiculopathy-S/P laminectomy Lumbar 4-5 and L 5 S1 11/14.Back corset when out of bed.Routine back precautions. Consider b/l AFO's for gait, but with edema presently, i'm not anxious to order. Edema control. Probably will try AFO's as an outpt.  2. DVT Prophylaxis/Anticoagulation: SCD,s/ and support hose. Dopplers negative  3. Pain Management: percocet.prn--- will resume low dose neurontin as i doubt this was at the core of AMS and because foot pain is becoming a real problem again.  Observe mental status closely with adjustment of neurontin.   4.Ischemic Colitis-S/P exp lap with right hemicolectomy and ileostomy 11/24.Provide ileostomy care and education. She is doing some ostomy care already on her own.   -wbc's 11-13k.  Converted to cipro yesterday which will cover e coli.  -patient shows continued clinical improvement  -drain per surgery and IR. Drainage decreasing.  5. ESRD: dialysis as directed per renal services.Weigh patient daily.Follow up labs with dialysis.    6Chronic anemia-aranesp with HD.Follow up CBC with HD and transfuse if needed.   7.HTN-No present meds and monitor  with increased activity.   8. AMS:  Much better with rx of above however pt with ongoing memory deficits.  I hesitate in stopping neurontin as it helps with neuro pain in feet.       LOS (Days) 14 A FACE TO FACE EVALUATION WAS PERFORMED  SWARTZ,ZACHARY T 03/11/2011, 7:34 AM

## 2011-03-15 ENCOUNTER — Inpatient Hospital Stay (HOSPITAL_COMMUNITY): Payer: Medicare Other

## 2011-03-15 DIAGNOSIS — N186 End stage renal disease: Secondary | ICD-10-CM

## 2011-03-15 DIAGNOSIS — M47817 Spondylosis without myelopathy or radiculopathy, lumbosacral region: Secondary | ICD-10-CM

## 2011-03-15 DIAGNOSIS — Z5189 Encounter for other specified aftercare: Secondary | ICD-10-CM

## 2011-03-15 DIAGNOSIS — IMO0002 Reserved for concepts with insufficient information to code with codable children: Secondary | ICD-10-CM

## 2011-03-15 DIAGNOSIS — R609 Edema, unspecified: Secondary | ICD-10-CM

## 2011-03-15 NOTE — Progress Notes (Signed)
Occupational Therapy Note  Patient Details  Name: [name] MRN: [id number] Date of Birth: 09-11-1950 Today's Date: 03/15/2011  1345-1415 - 33 Minutes Individual Therapy Patient complained of pain, no rate given. RN aware.  Upon entering room, patient seated in w/c. Daughter (main caregiver) present in room. Discussed back precautions with daughter and patient. Verbalized and demonstrated donning/doffing bilateral shoes with AFOs inserted with daughter. Also completed w/c -> bed stand pivot transfer, lifted bed to height of patients bed at home (~8inches higher than w/c). Patient performed stand pivot transfer with moderate assistance from therapist. Verbalized and had patient demonstrate correct bed mobility without breaking back precautions to get back to bed. Discussed possibly getting a hospital bed for discharge home. Case manager notified to talk with patient and daughter per their request.   Ason Heslin 03/15/2011, 3:22 PM

## 2011-03-15 NOTE — Progress Notes (Signed)
8 Days Post-Op  Subjective: Ischemic colitis; Rt psoas/abd abscess drain placed 12/21 Output decreasing; in therapy now. Feels better  Objective: Vital signs in last 24 hours: Temp:  [97.3 F (36.3 C)-98.9 F (37.2 C)] 98.9 F (37.2 C) (12/28 0556) Pulse Rate:  [98-128] 98  (12/28 0556) Resp:  [17-20] 20  (12/28 0556) BP: (98-147)/(65-114) 114/66 mmHg (12/28 0556) SpO2:  [96 %-99 %] 98 % (12/28 0556) Weight:  [114 lb 13.8 oz (52.1 kg)-119 lb 0.8 oz (54 kg)] 114 lb 13.8 oz (52.1 kg) (12/28 0556) Last BM Date: 03/14/11  Intake/Output from previous day: 12/27 0701 - 12/28 0700 In: 1250 [P.O.:1240] Out: 2261 [Drains:10; Stool:550] Intake/Output this shift:    PE:  Afebrile; drain intact Site clean and dry; NT; no bleeding Output minimal in JP now (approx 15cc); serous 30 cc recorded 12/27    Lab Results:   [facility] 03/14/11 1855  WBC 16.3*  HGB 7.7*  HCT 24.5*  PLT 379   BMET  Basename 03/14/11 1855  NA 134*  K 4.5  CL 97  CO2 25  GLUCOSE 132*  BUN 40*  CREATININE 5.26*  CALCIUM 8.8   PT/INR No results found for this basename: LABPROT:2,INR:2 in the last 72 hours ABG No results found for this basename: PHART:2,PCO2:2,PO2:2,HCO3:2 in the last 72 hours  Studies/Results: No results found.  Anti-infectives: Anti-infectives     Start     Dose/Rate Route Frequency Ordered Stop   03/11/11 0800   ciprofloxacin (CIPRO) tablet 500 mg  Status:  Discontinued        500 mg Oral 2 times daily 03/11/11 0752 03/11/11 0754   03/11/11 0800   ciprofloxacin (CIPRO) tablet 500 mg        500 mg Oral Daily with breakfast 03/11/11 0754     03/09/11 2000   ertapenem (INVANZ) 0.5 g in sodium chloride 0.9 % 50 mL IVPB  Status:  Discontinued        500 mg 100 mL/hr over 30 Minutes Intravenous Every 24 hours 03/09/11 1702 03/11/11 0752   03/09/11 1800   ertapenem (INVANZ) 0.5 g in sodium chloride 0.9 % 50 mL IVPB  Status:  Discontinued        500 mg 100 mL/hr over 30  Minutes Intravenous Every 24 hours 03/09/11 1658 03/09/11 1702   03/08/11 1100   metroNIDAZOLE (FLAGYL) IVPB 500 mg  Status:  Discontinued        500 mg 100 mL/hr over 60 Minutes Intravenous Every 8 hours 03/08/11 1036 03/11/11 0752   03/03/11 1200   ertapenem (INVANZ) 0.5 g in sodium chloride 0.9 % 50 mL IVPB  Status:  Discontinued        500 mg 100 mL/hr over 30 Minutes Intravenous Every 24 hours 03/03/11 1108 03/09/11 1658   03/03/11 1045   ertapenem (INVANZ) 1 g in sodium chloride 0.9 % 50 mL IVPB  Status:  Discontinued        1 g 100 mL/hr over 30 Minutes Intravenous Every 24 hours 03/03/11 1043 03/03/11 1107          Assessment/Plan: s/p Procedure(s): COLONOSCOPY  Rt psoas/abdominal drain placed 12/21. Output decreasing; afebrile; wbc 16.3 12/27 Cx: Ecoli For CT today per [facility]; will check     [name] 03/15/2011

## 2011-03-15 NOTE — Progress Notes (Signed)
Physical Therapy Note  Patient Details  Name: [name] MRN: [id number] Date of Birth: 1950/06/26 Today's Date: 03/15/2011  Individual therapy [phone] (50 minutes, missed last 10 minutes due do radiology taking pt for procedure) Premedicated for pain. Pt's daugther, Mateo Flow, present for family education. Focused on basic transfer training from even and uneven surfaces with use of slideboard and squat pivot technique. Pt required between supervision for slideboard transfer and up to [place] for squat pivot technique to minA. Reviewed techniques and cueing with daughter, importance of back precautions with mobility and body mechanics for technique. Second daughter Elmyra Ricks) present for part of session but appears hesitant with transfers but able to return demonstrate with encouragement. Sit to stands with RW with total A + 2 (pt =50%) x 3 reps, unable to get into full erect posture with knees buckling ~ 10 seconds each time. Poor eccentric control noted with descent uncontrolled. Radiology came for pt with 10 minutes left in session for procedure off floor.  Canary Brim [facility] 03/15/2011, 10:23 AM

## 2011-03-15 NOTE — Progress Notes (Signed)
Occupational Therapy Note  Patient Details  Name: KEHAULANI ODEGAARD MRN: [id number] Date of Birth: 1951-02-14 Today's Date: 03/15/2011  [id number] - 14 Minutes Individual Therapy  Patient complained 8/10 pain in lower back and throughout right lower extremity; RN aware.   Found patient supine in bed already dressed and patient with statement, "I'll change my clothes later". Encouraged patient to complete grooming tasks at sink in w/c, patient agreeable. Supine to sit with supervision using bed rails and with HOB elevated. Sat edge of bed for therapist to donn bilateral shoes with AFOs. Completed edge of bed -> w/c squat pivot/scoot transfer with moderate assistance. Patient then completed grooming tasks seated in w/c at sink with more than reasonable amount of time (mod I level).   Nissim Fleischer 03/15/2011, 8:05 AM

## 2011-03-15 NOTE — Progress Notes (Signed)
2 person assist to transfer corsett out of bed.  ileostomy rt side. Hemodialysis t,t,sat rt arm graft.. JP drain on rt.  RT IJ to be removed today. Culture tip.  Keeping LUE elevated.. Bathing and dressing with therapy.

## 2011-03-15 NOTE — Progress Notes (Signed)
Per State Regulation 482.30 This chart was reviewed for medical necessity with respect to the patient's Admission/Duration of stay. Spoke w/ pt's daughter yesterday to review team conf: ELOS 03/21/11  Goals: Min-occasional mod assist  Need for family ed.  Daughter has been in all day today to participate w/ therapies.  She plans to be here most days before pt's d/c to continue education/practice.  Pt w/ improved affect today, smiling more.   MD addressing L arm edema & increasing WBCs   Theora Master                 Nurse Care Manager              Next Review Date: 03/20/11

## 2011-03-15 NOTE — Progress Notes (Signed)
MD please note left arm swelling Pt states that this has progressed since her back surgery Arthor Captain LPN

## 2011-03-15 NOTE — Progress Notes (Signed)
With WBC rising it may be necessary to repeat her CT of the abdomen and pelvis.  Will review prior studies.  Kathryne Eriksson. Dahlia Bailiff, MD, [place] [phone] [facility]

## 2011-03-15 NOTE — Progress Notes (Signed)
*  PRELIMINARY RESULTS*  Left Upper Extremity Venous has been performed. Left = Evidence of partial DVT seen in the internal jugular, subclavian, axcillary, and proximal brachial veins. Superficial thrombus seen in mid cephalic vein.  Landry Mellow RDMS 03/15/2011, 12:03 PM

## 2011-03-15 NOTE — Progress Notes (Signed)
8 Days Post-Op  Subjective: Her WBC is up today so Rehab is checking her Dopplers on L arm, repeating CT, and going to change Centrral line.    Not in room now.  Will come back later. Objective: Vital signs in last 24 hours: Temp:  [97.3 F (36.3 C)-98.9 F (37.2 C)] 98.9 F (37.2 C) (12/28 0556) Pulse Rate:  [98-128] 98  (12/28 0556) Resp:  [17-20] 20  (12/28 0556) BP: (98-147)/(65-114) 114/66 mmHg (12/28 0556) SpO2:  [96 %-99 %] 98 % (12/28 0556) Weight:  [52.1 kg (114 lb 13.8 oz)-54 kg (119 lb 0.8 oz)] 114 lb 13.8 oz (52.1 kg) (12/28 0556) Last BM Date: 03/14/11  Intake/Output from previous day: 12/27 0701 - 12/28 0700 In: 1250 [P.O.:1240] Out: 2261 [Drains:10; Stool:550] Intake/Output this shift:   Exam:  Out of room now.   Lab Results:   [facility] 03/14/11 1855  WBC 16.3*  HGB 7.7*  HCT 24.5*  PLT 379    BMET  Basename 03/14/11 1855  NA 134*  K 4.5  CL 97  CO2 25  GLUCOSE 132*  BUN 40*  CREATININE 5.26*  CALCIUM 8.8   PT/INR No results found for this basename: LABPROT:2,INR:2 in the last 72 hours   Studies/Results: No results found.  Anti-infectives: Anti-infectives     Start     Dose/Rate Route Frequency Ordered Stop   03/11/11 0800   ciprofloxacin (CIPRO) tablet 500 mg  Status:  Discontinued        500 mg Oral 2 times daily 03/11/11 0752 03/11/11 0754   03/11/11 0800   ciprofloxacin (CIPRO) tablet 500 mg        500 mg Oral Daily with breakfast 03/11/11 0754     03/09/11 2000   ertapenem (INVANZ) 0.5 g in sodium chloride 0.9 % 50 mL IVPB  Status:  Discontinued        500 mg 100 mL/hr over 30 Minutes Intravenous Every 24 hours 03/09/11 1702 03/11/11 0752   03/09/11 1800   ertapenem (INVANZ) 0.5 g in sodium chloride 0.9 % 50 mL IVPB  Status:  Discontinued        500 mg 100 mL/hr over 30 Minutes Intravenous Every 24 hours 03/09/11 1658 03/09/11 1702   03/08/11 1100   metroNIDAZOLE (FLAGYL) IVPB 500 mg  Status:  Discontinued        500 mg 100  mL/hr over 60 Minutes Intravenous Every 8 hours 03/08/11 1036 03/11/11 0752   03/03/11 1200   ertapenem (INVANZ) 0.5 g in sodium chloride 0.9 % 50 mL IVPB  Status:  Discontinued        500 mg 100 mL/hr over 30 Minutes Intravenous Every 24 hours 03/03/11 1108 03/09/11 1658   03/03/11 1045   ertapenem (INVANZ) 1 g in sodium chloride 0.9 % 50 mL IVPB  Status:  Discontinued        1 g 100 mL/hr over 30 Minutes Intravenous Every 24 hours 03/03/11 1043 03/03/11 1107         Current Facility-Administered Medications  Medication Dose Route Frequency Provider Last Rate Last Dose  . 0.9 %  sodium chloride infusion  100 mL Intravenous PRN Sherril Croon, MD      . 0.9 %  sodium chloride infusion  100 mL Intravenous PRN Sherril Croon, MD      . acetaminophen (TYLENOL) tablet 325-650 mg  325-650 mg Oral Q4H PRN Lavon Paganini. Angiull[place], PA   650 mg at 03/15/11 0300  . ciprofloxacin (CIPRO)  tablet 500 mg  500 mg Oral Q breakfast Meredith Staggers, MD   500 mg at 03/15/11 [id number]  . darbepoetin (ARANESP) injection 200 mcg  200 mcg Intravenous Q Thu-HD Tad Moore, PHARMD   200 mcg at 03/14/11 2121  . diphenhydrAMINE (BENADRYL) capsule 25 mg  25 mg Oral Q6H PRN Meredith Staggers, MD      . feeding supplement (NEPRO CARB STEADY) liquid 237 mL  237 mL Oral TID WC Otila Back, PHARMD   237 mL at 03/15/11 0830  . feeding supplement (NEPRO CARB STEADY) liquid 237 mL  237 mL Oral PRN Sherril Croon, MD   237 mL at 03/04/11 0857  . heparin injection 1,000 Units  1,000 Units Dialysis PRN Sherril Croon, MD      . heparin injection 2,100 Units  40 Units/kg Dialysis PRN Clayborne Dana. Posey Pronto, MD      . lidocaine-prilocaine (EMLA) cream 1 application  1 application Topical PRN Sherril Croon, MD      . megestrol (MEGACE) 400 MG/10ML suspension 200 mg  200 mg Oral BID Foye Clock, PA   200 mg at 03/15/11 [id number]  . metoprolol (LOPRESSOR) injection 5 mg  5 mg Intravenous Q6H PRN Lavon Paganini. [place], PA      . multivitamin  (RENA-VIT) tablet 1 tablet  1 tablet Oral Daily Lavon Paganini. [place], PA   1 tablet at 03/15/11 0742  . oxyCODONE-acetaminophen (PERCOCET) 5-325 MG per tablet 1-2 tablet  1-2 tablet Oral Q4H PRN Nyoka Cowden   2 tablet at 03/15/11 0830  . pantoprazole (PROTONIX) EC tablet 40 mg  40 mg Oral Q1200 Meredith Staggers, MD   40 mg at 03/14/11 1208  . pentafluoroprop-tetrafluoroeth (GEBAUERS) aerosol 1 application  1 application Topical PRN Sherril Croon, MD      . promethazine (PHENERGAN) tablet 12.5 mg  12.5 mg Oral Q6H PRN Lavon Paganini. Angi[place]i, PA       Or  . promethazine (PHENERGAN) suppository 12.5 mg  12.5 mg Rectal Q6H PRN Lavon Paganini. [place], PA       Or  . promethazine (PHENERGAN) injection 12.5 mg  12.5 mg Intramuscular Q6H PRN Lavon Paganini. [place], PA   12.5 mg at 03/09/11 1449  . sodium chloride 0.9 % injection 10 mL  10 mL Intracatheter PRN Meredith Staggers, MD   10 mL at 03/12/11 2115  . sorbitol 70 % solution 30 mL  30 mL Oral Daily PRN Lavon Paganini. [place], PA      . DISCONTD: abciximab (REOPRO) 7.2 mg in sodium chloride 0.9 % 250 mL infusion  0.125 mcg/kg/min Intravenous Continuous Jay K. Posey Pronto, MD      . DISCONTD: abciximab (REOPRO) injection 13.6 mg  0.25 mg/kg Intravenous Once Jay K. Posey Pronto, MD        Assessment/Plan Ischemic colitis with exploratory lap, R hemicolectomy and long Hartmans Pouch11/24/1/; with post op bleeding. From Rectum and concern for ischemia of her remaining colon.  Colonoscopy 03/07/11 DR.Buccini No ischemia. CT guided Drainage of RLQ abscess 03/08/11. S/p lumbar fusion 10 days prior to abdominal events. Patient Active Problem List  Diagnoses  . Lumbar stenosis with neurogenic claudication  . Spondylolisthesis of lumbar region  . End stage renal disease on dialysis  . Hypertension  . Unspecified viral hepatitis C without hepatic coma  . Radiculopathy  . Anemia of chronic kidney failure  . Acute ischemic colitis  . Radiculopathy of lumbar region  Plan:   Down  for CT, and UE dopplers, Central line change.  Will follow and see later.                         LOS: 18 days    [name] 03/15/2011

## 2011-03-15 NOTE — Progress Notes (Signed)
Subjective: Interval History: Patient underwent dialysis yesterday.  Signed off early due to back and leg pain. Presently not in her room - all chart records reviewed. Also spoke with CIR MD - he has concern that mentally she is not as good, is requiring large amounts of assist, and has concern about ongoing/untreated infection as WBC is rising. Objective:  Vital signs in last 24 hours:  Temp:  [97.3 F (36.3 C)-98.9 F (37.2 C)] 98.9 F (37.2 C) (12/28 0556) Pulse Rate:  [98-128] 98  (12/28 0556) Resp:  [17-20] 20  (12/28 0556) BP: (98-147)/(65-114) 114/66 mmHg (12/28 0556) SpO2:  [96 %-99 %] 98 % (12/28 0556) Weight:  [52.1 kg (114 lb 13.8 oz)-54 kg (119 lb 0.8 oz)] 114 lb 13.8 oz (52.1 kg) (12/28 0556) Weight change: -2.3 kg (-5 lb 1.1 oz)  Intake/Output: I/O last 3 completed shifts: In: 1250 [P.O.:1240; Other:10] Out: 2261 [Drains:10; Other:1701; Stool:550]  Intake/Output this shift:     EXAM: Patient is in therapies so exam will be done when returns to room.  Lab Results: (pre-dialysis yesterday)  Basename 03/14/11 1855  WBC 16.3*  HGB 7.7*  HCT 24.5*  PLT 379   BMET  Basename 03/14/11 1855  NA 134*  K 4.5  CL 97  CO2 25  GLUCOSE 132*  BUN 40*  CREATININE 5.26*  CALCIUM 8.8  PHOS 1.8*   LFT  Basename 03/14/11 1855  PROT --  ALBUMIN 2.1*  AST --  ALT --  ALKPHOS --  BILITOT --  BILIDIR --  IBILI --   PT/INR No results found for this basename: LABPROT:2,INR:2 in the last 72 hours Hepatitis Panel No results found for this basename: HEPBSAG,HCVAB,HEPAIGM,HEPBIGM in the last 72 hours PTH: Lab Results  Component Value Date   PTH 96.2* 02/24/2011   CALCIUM 8.8 03/14/2011   CAION 0.90* 02/09/2011   PHOS 1.8* 03/14/2011    Studies/Results: No results found.  Assessment/Plan: 1. ESRD - on HD on TTS @ [place], stable with K of 4.4. HD tomorrow (signed off early last PM due to pain)  2. Abdominal abscess (after hemicolectomy surgery for ischemic  bowel with ileostomy creation 02/09/11) - Right flank drain placed 12/21, body fluid culture from her drained intra-abdominal abscess showed E. Coli, currently on narrowed antibiotic spectrum with ciprofloxacin. She is afebrile but still remains tachycardic. WBC is increasing. Question re-image to see if adequate drainage???  Also, with rising WBC would strongly consider pulling her central line 3. Anemia - Hgb low at 7.7, on Aranesp 200 mcg on Thurs. Will check iron studies.  4. Secondary hyperparathyroidism - Off binders, low Phos persists  5. Nutrition - on Nepro to supplement oral intake.  6. Lumbar radiculopathy: Management per inpatient rehabilitation service.  7. Bilateral lower extremity pain gabapentin 100 mg twice a day for neuropathic leg pain. Continue to monitor while on rehabilitation. (surgery with lumbar decompression by Dr. Sherwood Gambler was on 01/30/11 = original reason for hospital admission)    LOS: 18 Krisna Omar B @TODAY @9 :51 AM

## 2011-03-15 NOTE — Progress Notes (Addendum)
Patient ID: [name], female   DOB: November 13, 1950, 60 y.o.   MRN: [id number] Subjective/Complaints: Review of Systems  Musculoskeletal: Positive for back pain.  Left arm swollen.  Has some pain near biceps  Objective: Vital Signs: Blood pressure 122/77, pulse 102, temperature 98.9 F (37.2 C), temperature source Oral, resp. rate 18, height 5\' 5"  (1.651 m), weight 50.8 kg (111 lb 15.9 oz), SpO2 98.00%. No results found.  Basename 03/10/11 0500 03/09/11 1359  WBC 11.3* 13.0*  HGB 7.9* 8.1*  HCT 25.7* 26.0*  PLT 428* 513*    Basename 03/10/11 0500 03/09/11 1358  NA 140 141  K 4.2 4.0  CL 102 102  CO2 30 25  GLUCOSE 109* 113*  BUN 19 31*  CREATININE 3.13* 5.28*  CALCIUM 9.1 9.5   CBG (last 3)   Basename 03/09/11 1809  GLUCAP 88    Wt Readings from Last 3 Encounters:  03/11/11 50.8 kg (111 lb 15.9 oz)  03/11/11 50.8 kg (111 lb 15.9 oz)  02/24/11 56.4 kg (124 lb 5.4 oz)    Physical Exam:  General appearance: alert, cooperative and no distress Head: Normocephalic, without obvious abnormality, atraumatic Eyes: conjunctivae/corneas clear. PERRL, EOM's intact. Fundi benign. Ears: normal TM's and external ear canals both ears Nose: Nares normal. Septum midline. Mucosa normal. No drainage or sinus tenderness. Throat: lips, mucosa, and tongue normal; teeth and gums normal Neck: no adenopathy, no carotid bruit, no JVD, supple, symmetrical, trachea midline and thyroid not enlarged, symmetric, no tenderness/mass/nodules  R   IJ in place Back: symmetric, no curvature. ROM normal. No CVA tenderness. Resp: clear to auscultation bilaterally Cardio: regular rate and rhythm, S1, S2 normal, Gr 3/6 SEM  No  click, rub or gallop; rate 90-100 GI: soft, non-tender; bowel sounds normal; no masses,  no organomegaly;  colostomy noted Extremities: extremities normal, atraumatic, no cyanosis or edema slight edema distally only; AV fistula R arm.  LUE with 2+ edema and mild pain in proximal  arm.  No definite masses. LUE is warm with palpable pulses at wrist. Pulses: 2+ and symmetric Skin: Skin color, texture, turgor normal. No rashes or lesions Neurologic: continued foot drop tr/5 but pt denies sensory changes. Plantarflexion 3-4/5.  Generalized weakness in proximal legs 3/5.  Heel cords loose.  More alert than yesterday. Incision/Wound: wounds all clean and intact.  Ostomy site sealed.     CBG (last 3)   Basename 03/09/11 1809  GLUCAP 88    Assessment/Plan: 1. Functional deficits secondary to lumbar stenosis with radiculopathy s/p decompression and ischemic colitis requiring colectomy which require 3+ hours per day of interdisciplinary therapy in a comprehensive inpatient rehab setting. Physiatrist is providing close team supervision and 24 hour management of active medical problems listed below. Physiatrist and rehab team continue to assess barriers to discharge/monitor patient progress toward functional and medical goals.    Mobility: Bed Mobility Bed Mobility: Yes Rolling Right: 4: Min assist;With rail Rolling Right Details (indicate cue type and reason): Cues to bend bil LEs and roll completely before pushign up to sit Left Sidelying to Sit: 3: Mod assist Left Sidelying to Sit Details (indicate cue type and reason): Increased effort and pain with coming up to sit'  Transfers Transfers: Yes Sit to Stand: 2: Max assist;With upper extremity assist;From chair/3-in-1 Stand to Sit: 2: Max assist;With upper extremity assist;To chair/3-in-1 Squat Pivot Transfers: 3: Mod assist Ambulation/Gait Ambulation/Gait Assistance: 4: Min assist Ambulation/Gait Assistance Details (indicate cue type and reason): A for LE lifting and placing  due to bil LE foot drop, A for seaight shifting to unweight opposite LE, blocking knees Ambulation Distance (Feet): 8 Feet Assistive device: Rolling walker Gait Pattern: Decreased dorsiflexion - right;Decreased dorsiflexion - left;Right foot  flat;Left foot flat;Right genu recurvatum;Left genu recurvatum;Trunk flexed;Right hip hike Stairs: No (Unsafe t this time) Architect: Yes Wheelchair Assistance: 4: Min Lexicographer: Both upper extremities Wheelchair Parts Management: Supervision/cueing (Cues for maintaining back precautions with parts management) Distance: 150 ADL:   Cognition: Cognition Overall Cognitive Status: Appears within functional limits for tasks assessed Arousal/Alertness: Lethargic Orientation Level: Oriented to person;Oriented to place;Oriented to time Memory: Impaired (forgot date after 1 minute) Awareness: Appears intact Problem Solving: Appears intact Safety/Judgment: Appears intact Cognition Arousal/Alertness: Lethargic Orientation Level: Oriented to person;Oriented to place;Oriented to time  1.Lumbar stenosis with radiculopathy-S/P laminectomy Lumbar 4-5 and L 5 S1 11/14.Back corset when out of bed.Routine back precautions. Consider b/l AFO's for gait, but with edema presently, i'm not anxious to order. Edema control. Probably will try AFO's as an outpt.  2. DVT Prophylaxis/Anticoagulation: SCD,s/ and support hose. Dopplers negative  3. Pain Management: percocet.prn--- will resume low dose neurontin as i doubt this was at the core of AMS and because foot pain is becoming a real problem again.  Observe mental status closely with adjustment of neurontin.   4.Ischemic Colitis-S/P exp lap with right hemicolectomy and ileostomy 11/24.Provide ileostomy care and education. She is doing some ostomy care already on her own.   -wbc's back to 16k.  PO Cipro for e coli.  Should we change back to IV rx?  -JP drainage appears to be decreased, however output volumes have not been recorded  -given increased wbc's and overall presentation will check abdominal CT today  -d/c central line and culture  5. ESRD: dialysis as directed per renal services.Weigh patient  daily.Follow up labs with dialysis.    6Chronic anemia-aranesp with HD.Follow up CBC with HD and transfuse if needed.   7.HTN-No present meds and monitor with increased activity.   8. AMS:  Has gone backwards this week, and I can't help to think it's her pain medications.  The scheduled oxycodone and neurontin were stopped yesterday. Also think late night HD's and overall mental fatigue from this hospitalization are playing roles.  9. LUE--HD access on RUE.  Will check venous dopplers LUE.  Discuss with renal as well.       LOS (Days) 14 A FACE TO FACE EVALUATION WAS PERFORMED  Aniza Shor T 03/11/2011, 7:34 AM

## 2011-03-16 LAB — RENAL FUNCTION PANEL
BUN: 33 mg/dL — ABNORMAL HIGH (ref 6–23)
Calcium: 9.1 mg/dL (ref 8.4–10.5)
Glucose, Bld: 116 mg/dL — ABNORMAL HIGH (ref 70–99)
Phosphorus: 1.4 mg/dL — ABNORMAL LOW (ref 2.3–4.6)
Sodium: 133 mEq/L — ABNORMAL LOW (ref 135–145)

## 2011-03-16 LAB — CBC
HCT: 25.7 % — ABNORMAL LOW (ref 36.0–46.0)
Hemoglobin: 7.9 g/dL — ABNORMAL LOW (ref 12.0–15.0)
MCH: 27.1 pg (ref 26.0–34.0)
MCHC: 30.7 g/dL (ref 30.0–36.0)

## 2011-03-16 LAB — IRON AND TIBC: UIBC: 132 ug/dL (ref 125–400)

## 2011-03-16 NOTE — Progress Notes (Signed)
Occupational Therapy Note  Patient Details  Name: [name] MRN: [id number] Date of Birth: 08-28-1950 Today's Date: 03/16/2011  1340-1420 - 20 Minutes Individual Therapy No complaints of pain  Therapeutic activities focusing on tub bench transfer <-> w/c (performing squat pivot transfer), w/c to regular bed squat pivot transfer, bed mobility adhering to back precautions, log rolling left <-> right adhering to back precautions. Taught patient compensatory strategies in order to complete bed mobility for rolling and donning pants without breaking back precautions. Also engaged in upper extremity exercises and gave patient squeeze ball to help decrease swelling/edema throughout left upper extremity. Left patient seated in w/c with left arm elevated and call bell & telephone within reach.   Salathiel Ferrara 03/16/2011, 2:22 PM

## 2011-03-16 NOTE — Progress Notes (Signed)
Physical Therapy Note  Patient Details  Name: [name] MRN: [id number] Date of Birth: [birth year] Today's Date: 03/16/2011  [id number] (55 minutes) group treatment Pain : bilateral LEs/feet - 5/10 premedicated Focus of treatment: Pt participated in UE strengthening and endurance group to improve functional mobility Treatment: Nustep ( crosstrainer) Level 2 X 10 minutes with difficulty maintaining feet on footplates; bilateral UE shoulder depression, elbow extension, scapular adduction, biceps flexion X 15 using orange theraband Transfers: sliding board setup + SBA for safety  Tida Saner,JIM 03/16/2011, 11:43 AM

## 2011-03-16 NOTE — Progress Notes (Addendum)
Patient ID: [name], female   DOB: [birth year], 60 y.o.   MRN: [id number]  Rt abd abscess drain placed 12/21. CT 12/28 shows good resolution of collection. Output not recorded real well, but has decreased in last 2 days.(10 cc 12/28) Afeb; wbc 16.3 12/26 Cx Ecoli  Rec: if output 10cc or less; wbc nl; could consider removal.  Plan: per surgery

## 2011-03-16 NOTE — Progress Notes (Signed)
Occupational Therapy Note  Patient Details  Name: [name] MRN: [id number] Date of Birth: November 14, 1950 Today's Date: 03/16/2011  0900-1000 - 68 Minutes Individual Therapy  No complaints of pain  Engaged in ADL retraining at bed level; sitting edge of bed. Focused skilled intervention on increasing overall activity tolerance/endurance, dynamic sitting balance/tolerance, adhering to back precautions throughout ADLs,  UB/LB bathing & dressing, donning lumbar corset, edge of bed -> w/c squat pivot transfer with minimal assistance, and grooming tasks seated in w/c at sink.   Derrik Mceachern 03/16/2011, 9:29 AM

## 2011-03-16 NOTE — Progress Notes (Signed)
Dialysis today.rt arm graft.++ Jp drain charged.0 voids.2+ stand pivot.bathing and dressing with therapy. Left upper extremity elevated. Continue plan of care.

## 2011-03-16 NOTE — Progress Notes (Signed)
Pt refuses to complete entire Hemodialysis treatment. Signed off AMA with 1 hour and 23 minutes remaining. Amalia Hailey aware. Risks of non-compliance with treatment regimen explained.

## 2011-03-16 NOTE — Progress Notes (Signed)
Physical Therapy Note  Patient Details  Name: ETHNA KONZ MRN: [id number] Date of Birth: 26-Dec-1950 Today's Date: 03/16/2011  1300-1325 (25 minutes) individual treatment Pain: no complaint of pain Focus of treatment: sit to stand/standing toleranceTreatment: Pt. Reports she will attempt standing to standing frame X 10 minutes (no complaint of pain) ; Standing without belt X 4 minutes to standing frame  Felina Tello,JIM 03/16/2011, 1:19 PM

## 2011-03-16 NOTE — Progress Notes (Signed)
Patient ID: [name], female   DOB: 10/15/1950, 60 y.o.   MRN: [id number] Patient ID: [name], female   DOB: 10-Aug-1950, 60 y.o.   MRN: [id number] Subjective/Complaints: Review of Systems  Musculoskeletal: Positive for back pain.  Left arm less swollen.  Feels a little better today.   Objective: Vital Signs: Blood pressure 133/81, pulse 104, temperature 98.7 F (37.2 C), temperature source Oral, resp. rate 18, height 5\' 5"  (1.651 m), weight 50.8 kg (111 lb 15.9 oz), SpO2 98.00%.   Physical Exam:  General appearance: alert, cooperative and no distress Head: Normocephalic, without obvious abnormality, atraumatic Eyes: conjunctivae/corneas clear. PERRL, EOM's intact. Fundi benign. Ears: normal TM's and external ear canals both ears Nose: Nares normal. Septum midline. Mucosa normal. No drainage or sinus tenderness. Throat: lips, mucosa, and tongue normal; teeth and gums normal Neck: no adenopathy, no carotid bruit, no JVD, supple, symmetrical, trachea midline and thyroid not enlarged, symmetric, no tenderness/mass/nodules  R   IJ in place Back: symmetric, no curvature. ROM normal. No CVA tenderness. Resp: clear to auscultation bilaterally Cardio: regular rate and rhythm, S1, S2 normal, Gr 3/6 SEM  No  click, rub or gallop; rate 90-100 GI: soft, non-tender; bowel sounds normal; no masses,  no organomegaly;  colostomy noted Extremities: extremities normal, atraumatic, no cyanosis or edema slight edema distally only; AV fistula R arm.  LUE with 1 edema and mild pain in proximal arm.  No definite masses. LUE is warm with palpable pulses at wrist. Pulses: 2+ and symmetric Skin: Skin color, texture, turgor normal. No rashes or lesions Neurologic: continued foot drop tr/5 but pt denies sensory changes. Plantarflexion 3-4/5.  Generalized weakness in proximal legs 3/5.  Heel cords loose.  More alert! Incision/Wound: wounds all clean and intact.  Ostomy site sealed.     CBG (last 3)    Basename 03/09/11 1809  GLUCAP 88    Assessment/Plan: 1. Functional deficits secondary to lumbar stenosis with radiculopathy s/p decompression and ischemic colitis requiring colectomy which require 3+ hours per day of interdisciplinary therapy in a comprehensive inpatient rehab setting. Physiatrist is providing close team supervision and 24 hour management of active medical problems listed below. Physiatrist and rehab team continue to assess barriers to discharge/monitor patient progress toward functional and medical goals.    Mobility: Bed Mobility Bed Mobility: Yes Rolling Right: 4: Min assist;With rail Rolling Right Details (indicate cue type and reason): Cues to bend bil LEs and roll completely before pushign up to sit Left Sidelying to Sit: 3: Mod assist Left Sidelying to Sit Details (indicate cue type and reason): Increased effort and pain with coming up to sit'  Transfers Transfers: Yes Sit to Stand: 2: Max assist;With upper extremity assist;From chair/3-in-1 Stand to Sit: 2: Max assist;With upper extremity assist;To chair/3-in-1 Squat Pivot Transfers: 3: Mod assist Ambulation/Gait Ambulation/Gait Assistance: 4: Min assist Ambulation/Gait Assistance Details (indicate cue type and reason): A for LE lifting and placing due to bil LE foot drop, A for seaight shifting to unweight opposite LE, blocking knees Ambulation Distance (Feet): 8 Feet Assistive device: Rolling walker Gait Pattern: Decreased dorsiflexion - right;Decreased dorsiflexion - left;Right foot flat;Left foot flat;Right genu recurvatum;Left genu recurvatum;Trunk flexed;Right hip hike Stairs: No (Unsafe t this time) Architect: Yes Wheelchair Assistance: 4: Min Lexicographer: Both upper extremities Wheelchair Parts Management: Supervision/cueing (Cues for maintaining back precautions with parts management) Distance: 150 ADL:   Cognition: Cognition Overall  Cognitive Status: Appears within functional limits for tasks assessed  Arousal/Alertness: Lethargic Orientation Level: Oriented to person;Oriented to place;Oriented to time Memory: Impaired (forgot date after 1 minute) Awareness: Appears intact Problem Solving: Appears intact Safety/Judgment: Appears intact Cognition Arousal/Alertness: Lethargic Orientation Level: Oriented to person;Oriented to place;Oriented to time  1.Lumbar stenosis with radiculopathy-S/P laminectomy Lumbar 4-5 and L 5 S1 11/14.Back corset when out of bed.Routine back precautions. Consider b/l AFO's for gait, but with edema presently, i'm not anxious to order. Edema control. Probably will try AFO's as an outpt.  2. DVT Prophylaxis/Anticoagulation: SCD,s/ and support hose. Dopplers negative  3. Pain Management: percocet.prn--- will resume low dose neurontin as i doubt this was at the core of AMS and because foot pain is becoming a real problem again.  Observe mental status closely with adjustment of neurontin.   4.Ischemic Colitis-S/P exp lap with right hemicolectomy and ileostomy 11/24.Provide ileostomy care and education. She is doing some ostomy care already on her own.   -wbc's back to 16k on last check with f/u labs today. On cipro for e coli.   -JP drainage appears to be decreased, however output volumes have not been recorded  -abdominal CT looks better.  Await surgery input.  -d/c'd cenral line and culture negative  5. ESRD: dialysis as directed per renal services.Weigh patient daily.Follow up labs with dialysis.    6Chronic anemia-aranesp with HD.Follow up CBC with HD and transfuse if needed.   7.HTN-No present meds and monitor with increased activity.   8. AMS:  Has improved over the last 2 days.  The scheduled oxycodone and neurontin were stopped. Also think late night HD's and overall mental fatigue from this hospitalization are playing roles at times with mental status and certainly with fatigue.  9.  LUE--HD access on RUE.  Will check venous dopplers LUE.  Discuss with renal as well.       LOS (Days) Richardson T

## 2011-03-16 NOTE — Progress Notes (Signed)
Pt seen on HD.  Having difficulty with needle sticks but nurswe eventually was able to get two needles in.

## 2011-03-16 NOTE — Progress Notes (Addendum)
Subjective: Interval History:  Says had study of left arm to evaluate swelling (I cannot find the report) Worked with OP/PT today "worn out" Had CT to evaluate abd drain - abscess markedly decreased IJ line out now Objective:  Vital signs in last 24 hours:  Temp:  [98.7 F (37.1 C)-98.9 F (37.2 C)] 98.7 F (37.1 C) (12/29 0623) Pulse Rate:  [102-104] 104  (12/29 0623) Resp:  [19-20] 20  (12/29 0623) BP: (105-133)/(65-81) 133/81 mmHg (12/29 0623) SpO2:  [98 %-100 %] 98 % (12/29 0623) Weight:  [53.4 kg (117 lb 11.6 oz)] 117 lb 11.6 oz (53.4 kg) (12/29 0623) Weight change: -0.6 kg (-1 lb 5.2 oz)  Intake/Output: I/O last 3 completed shifts: In: [id number] [P.O.:840; I.V.:3] Out: 2027 [Other:1701; Stool:326]  Intake/Output this shift:  Total I/O In: 240 [P.O.:240] Out: -     . ciprofloxacin  500 mg Oral Q breakfast  . darbepoetin (ARANESP) injection - DIALYSIS  200 mcg Intravenous Q Thu-HD  . feeding supplement (NEPRO CARB STEADY)  237 mL Oral TID WC  . megestrol  200 mg Oral BID  . multivitamin  1 tablet Oral Daily  . pantoprazole  40 mg Oral Q1200   EXAM: General appearance: Alert, comfortable in chair, in no apparent distress; dressing right neck over prev IJ site Resp: CTA bilaterally  Cardio: Tachy around 100 with Gr II/IV systolic murmur  GI: Wearing brace and in wheelchair so can't examine abdomen but she says ileostomy working well and abd not tender;has JP drain b Extremities: Legs in braces.  Ankle edema onlyl no preibial edema  Access: AVG @ RUA  Left arm with edema; IV in that arm as well as parathyroid implant  Lab Results: (Today's labs to be done in HD)  [facility] 03/14/11 1855  WBC 16.3*  HGB 7.7*  HCT 24.5*  PLT 379   BMET  Basename 03/14/11 1855  NA 134*  K 4.5  CL 97  CO2 25  GLUCOSE 132*  BUN 40*  CREATININE 5.26*  CALCIUM 8.8  PHOS 1.8*   LFT  Basename 03/14/11 1855  PROT --  ALBUMIN 2.1*  AST --  ALT --  ALKPHOS --  BILITOT --    BILIDIR --  IBILI --   PT/INR No results found for this basename: LABPROT:2,INR:2 in the last 72 hours Hepatitis Panel No results found for this basename: HEPBSAG,HCVAB,HEPAIGM,HEPBIGM in the last 72 hours PTH: Lab Results  Component Value Date   PTH 96.2* 02/24/2011   CALCIUM 8.8 03/14/2011   CAION 0.90* 02/09/2011   PHOS 1.8* 03/14/2011    Studies/Results: Ct Abdomen Pelvis Wo Contrast  03/15/2011  *RADIOLOGY REPORT*  Clinical Data: Follow up of abscess/fluid collection. Leukocytosis.  CT ABDOMEN AND PELVIS WITHOUT CONTRAST  Technique:  Multidetector CT imaging of the abdomen and pelvis was performed following the standard protocol without intravenous contrast.  Comparison: 03/06/2011 and the draining CT of 03/08/2011.  Findings: Improved left base aeration with mild atelectasis or scarring remaining.  Mild cardiomegaly with no pericardial or pleural effusion.  Normal uninfused appearance of the liver, spleen, stomach, pancreas.  Degradation secondary beam hardening artifact from prior lumbar spine fixation and lack of IV contrast.  More mild today than on the prior.  Improved appearance of the gallbladder.  Mild wall thickening at 4 mm remains.  No calcified stones and no biliary ductal dilatation. Surgical clips in the right renal fossa.  Minimal soft tissue nodularity in this area could represent atrophic renal tissue but is  indeterminate.  Numerous low density left renal lesions are likely cysts, but incompletely characterized.  Minimal renal parenchymal calcifications.  Advanced aortic atherosclerosis.  No definite retroperitoneal adenopathy.  A long Hartmann's pouch.  Scattered diverticula within the Hartmann's pouch.  Normal caliber of the small bowel loops.  Right lower quadrant ileostomy.  No free intraperitoneal air or pneumatosis.  Interval placement of a percutaneous drain within the previously described right abdominal/flank infected hematoma.  Only ill- defined fluid and fascial  thickening identified in this area.  No residual drainable collection.  No new collection.  Right external iliac node is similar 8 mm and not pathologic by size criteria.  Decompressed urinary bladder. Normal uterus, without significant free pelvic fluid or adnexal mass.  No acute osseous abnormality.  L4-S1 trans pedicle screw fixation.  IMPRESSION:  1.  Improved appearance of the right abdominal wall/flank. Placement of a percutaneous drain, without evidence of residual infected hematoma. 2.  Improved left lung base with mild scarring or atelectasis remaining. 3.  Mildly degraded exam, as detailed above. 4.  Decreased gallbladder wall thickening, without specific evidence of cholecystitis.  Original Report Authenticated By: Areta Haber, M.D.    Assessment/Plan: 1. ESRD - on HD on TTS @ [facility], for dialysis today  2. Abdominal abscess (after hemicolectomy surgery for ischemic bowel with ileostomy creation 02/09/11) - Right flank drain placed 12/21, body fluid culture from her drained intra-abdominal abscess showed E. Coli, currently on narrowed antibiotic spectrum  with ciprofloxacin. She is afebrile but still remains tachycardic. WBC elevated - pending from today; re-imaging shows improvement in abscess.  3. Anemia - Hgb low at 7.7, on Aranesp 200 mcg on Thurs. Will check iron studies with HD today  4. Secondary hyperparathyroidism - Off binders, low Phos persists   5. Nutrition - on Nepro to supplement oral intake.   6. Lumbar radiculopathy: Management per inpatient rehabilitation service.   7. Bilateral lower extremity pain - her biggest complaint; meds d/c'd due to oversedation . Continue to monitor while on rehabilitation. (surgery with lumbar decompression by Dr. Sherwood Gambler was on 01/30/11 = original reason for hospital admission)   8. Left arm edema - am verbally told by rehab MD that she has DVT but cannot find the study; anticoag felt contraindicated with recent (01/2009) gi bleed;  elevation and heat planned   LOS: 19 Masayo Fera B @TODAY @11 :36 AM

## 2011-03-17 LAB — CBC
HCT: 24.4 % — ABNORMAL LOW (ref 36.0–46.0)
MCV: 88.4 fL (ref 78.0–100.0)
RDW: 18.8 % — ABNORMAL HIGH (ref 11.5–15.5)
WBC: 12.4 10*3/uL — ABNORMAL HIGH (ref 4.0–10.5)

## 2011-03-17 MED ORDER — POTASSIUM & SODIUM PHOSPHATES 280-160-250 MG PO PACK
1.0000 | PACK | Freq: Two times a day (BID) | ORAL | Status: AC
  Administered 2011-03-17 – 2011-03-18 (×4): 1 via ORAL
  Filled 2011-03-17 (×4): qty 1

## 2011-03-17 MED ORDER — SODIUM CHLORIDE 0.9 % IJ SOLN
125.0000 mg | INTRAVENOUS | Status: DC
  Administered 2011-03-18 – 2011-03-21 (×2): 125 mg via INTRAVENOUS
  Filled 2011-03-17 (×3): qty 10

## 2011-03-17 NOTE — Progress Notes (Signed)
10 Days Post-Op  Subjective: Rt flank hematoma drain placed 12/21 Better CT 12/28 shows resolution Output minimal  Objective: Vital signs in last 24 hours: Temp:  [98 F (36.7 C)-98.6 F (37 C)] 98 F (36.7 C) (12/30 0606) Pulse Rate:  [96-113] 96  (12/30 0606) Resp:  [15-18] 17  (12/30 0606) BP: (100-125)/(59-88) 119/73 mmHg (12/30 0606) SpO2:  [98 %-99 %] 98 % (12/30 0606) Weight:  [113 lb 15.7 oz (51.7 kg)-117 lb 8.1 oz (53.3 kg)] 113 lb 15.7 oz (51.7 kg) (12/30 0606) Last BM Date: 03/16/11  Intake/Output from previous day: 12/29 0701 - 12/30 0700 In: 480 [P.O.:480] Out: 1178 [Drains:10; Stool:450] Intake/Output this shift: Total I/O In: 240 [P.O.:240] Out: -   PE:  R flank drain intact Site clean and dry; NT Site clean and dry Output minimal; blood tinged Wbc 12.4 12/30  Lab Results:   Basename 03/17/11 0500 03/16/11 1549  WBC 12.4* 14.6*  HGB 7.5* 7.9*  HCT 24.4* 25.7*  PLT 334 379   BMET  Basename 03/16/11 1549 03/14/11 1855  NA 133* 134*  K 3.9 4.5  CL 96 97  CO2 26 25  GLUCOSE 116* 132*  BUN 33* 40*  CREATININE 4.74* 5.26*  CALCIUM 9.1 8.8   PT/INR No results found for this basename: LABPROT:2,INR:2 in the last 72 hours ABG No results found for this basename: PHART:2,PCO2:2,PO2:2,HCO3:2 in the last 72 hours  Studies/Results: Ct Abdomen Pelvis Wo Contrast  03/15/2011  *RADIOLOGY REPORT*  Clinical Data: Follow up of abscess/fluid collection. Leukocytosis.  CT ABDOMEN AND PELVIS WITHOUT CONTRAST  Technique:  Multidetector CT imaging of the abdomen and pelvis was performed following the standard protocol without intravenous contrast.  Comparison: 03/06/2011 and the draining CT of 03/08/2011.  Findings: Improved left base aeration with mild atelectasis or scarring remaining.  Mild cardiomegaly with no pericardial or pleural effusion.  Normal uninfused appearance of the liver, spleen, stomach, pancreas.  Degradation secondary beam hardening artifact  from prior lumbar spine fixation and lack of IV contrast.  More mild today than on the prior.  Improved appearance of the gallbladder.  Mild wall thickening at 4 mm remains.  No calcified stones and no biliary ductal dilatation. Surgical clips in the right renal fossa.  Minimal soft tissue nodularity in this area could represent atrophic renal tissue but is indeterminate.  Numerous low density left renal lesions are likely cysts, but incompletely characterized.  Minimal renal parenchymal calcifications.  Advanced aortic atherosclerosis.  No definite retroperitoneal adenopathy.  A long Hartmann's pouch.  Scattered diverticula within the Hartmann's pouch.  Normal caliber of the small bowel loops.  Right lower quadrant ileostomy.  No free intraperitoneal air or pneumatosis.  Interval placement of a percutaneous drain within the previously described right abdominal/flank infected hematoma.  Only ill- defined fluid and fascial thickening identified in this area.  No residual drainable collection.  No new collection.  Right external iliac node is similar 8 mm and not pathologic by size criteria.  Decompressed urinary bladder. Normal uterus, without significant free pelvic fluid or adnexal mass.  No acute osseous abnormality.  L4-S1 trans pedicle screw fixation.  IMPRESSION:  1.  Improved appearance of the right abdominal wall/flank. Placement of a percutaneous drain, without evidence of residual infected hematoma. 2.  Improved left lung base with mild scarring or atelectasis remaining. 3.  Mildly degraded exam, as detailed above. 4.  Decreased gallbladder wall thickening, without specific evidence of cholecystitis.  Original Report Authenticated By: Areta Haber, M.D.  Anti-infectives: Anti-infectives     Start     Dose/Rate Route Frequency Ordered Stop   03/11/11 0800   ciprofloxacin (CIPRO) tablet 500 mg  Status:  Discontinued        500 mg Oral 2 times daily 03/11/11 0752 03/11/11 0754   03/11/11 0800    ciprofloxacin (CIPRO) tablet 500 mg        500 mg Oral Daily with breakfast 03/11/11 0754     03/09/11 2000   ertapenem (INVANZ) 0.5 g in sodium chloride 0.9 % 50 mL IVPB  Status:  Discontinued        500 mg 100 mL/hr over 30 Minutes Intravenous Every 24 hours 03/09/11 1702 03/11/11 0752   03/09/11 1800   ertapenem (INVANZ) 0.5 g in sodium chloride 0.9 % 50 mL IVPB  Status:  Discontinued        500 mg 100 mL/hr over 30 Minutes Intravenous Every 24 hours 03/09/11 1658 03/09/11 1702   03/08/11 1100   metroNIDAZOLE (FLAGYL) IVPB 500 mg  Status:  Discontinued        500 mg 100 mL/hr over 60 Minutes Intravenous Every 8 hours 03/08/11 1036 03/11/11 0752   03/03/11 1200   ertapenem (INVANZ) 0.5 g in sodium chloride 0.9 % 50 mL IVPB  Status:  Discontinued        500 mg 100 mL/hr over 30 Minutes Intravenous Every 24 hours 03/03/11 1108 03/09/11 1658   03/03/11 1045   ertapenem (INVANZ) 1 g in sodium chloride 0.9 % 50 mL IVPB  Status:  Discontinued        1 g 100 mL/hr over 30 Minutes Intravenous Every 24 hours 03/03/11 1043 03/03/11 1107          Assessment/Plan: s/p Procedure(s): COLONOSCOPY  Hematoma Rt flank drain intact Resolution per CT 12/28 Output minimal WBC 12.4 12/30 Consider pull drain next 24 hrs. Plan per surgery; let us know if want pulled.    Ziyana Morikawa A 03/17/2011

## 2011-03-17 NOTE — Progress Notes (Addendum)
Two person stand pivot. ana uric . illeostomy rt  Abdomen .JP drain charged. Dialysis Tuesday Thursday and Saturday. Rt arm graft ++. Short term  Memory deficits. Calls for assist when needed. Continue plan of care. Percocet 2 tablets given at 12:15. For lower back pain. Nursing bath see fim.   Patient refused bath today

## 2011-03-17 NOTE — Progress Notes (Signed)
Physical Therapy Note  Patient Details  Name: OUITA IMANI MRN: [id number] Date of Birth: [birth year] Today's Date: 03/17/2011  [id number] (45 minutes) individual therapy session Pain: 6/10 bilateral LEs/feet- premedicated  Focus of treatment: Therapeutic activities to improve bilateral LE strength and tolerance to activity Transfers: Stand/pivot max assist; scoot wc to mat SBA without sliding board; mat to wc unlevel sliding board SBA Wc mobility: 120 feet SBA level/controlled environment X 2 : sit to supine (mat) mod assist LEs : supine to side to sit SBA (mat) with difficulty Treatment: AA bilateral heel slides X 15; AA SLRs with mod quad lag X 10; AA hip abduction; ankle pumps; SAQs 2 X 10 (AA on LT) ; Nustep (crosstrainer) Level 4 X 5 minutes   Emeline Simpson,JIM 03/17/2011, 10:17 AM

## 2011-03-17 NOTE — Progress Notes (Signed)
Subjective: Interval History:  Dialysis yesterday;says she signed off early;some difficulty with cannulation of AVF  Objective:  Vital signs in last 24 hours:  Temp:  [98 F (36.7 C)-98.6 F (37 C)] 98 F (36.7 C) (12/30 0606) Pulse Rate:  [96-113] 96  (12/30 0606) Resp:  [15-18] 17  (12/30 0606) BP: (100-125)/(59-88) 119/73 mmHg (12/30 0606) SpO2:  [98 %-99 %] 98 % (12/30 0606) Weight:  [51.7 kg (113 lb 15.7 oz)-53.3 kg (117 lb 8.1 oz)] 113 lb 15.7 oz (51.7 kg) (12/30 0606) Weight change: -0.1 kg (-3.5 oz)  Intake/Output: I/O last 3 completed shifts: In: 70 [P.O.:600; I.V.:3] Out: 1179 [Drains:10; Other:718; Stool:451]  Intake/Output this shift:  Total I/O In: 240 [P.O.:240] Out: -  Scheduled Meds:     . ciprofloxacin  500 mg Oral Q breakfast  . darbepoetin (ARANESP) injection - DIALYSIS  200 mcg Intravenous Q Thu-HD  . feeding supplement (NEPRO CARB STEADY)  237 mL Oral TID WC  . megestrol  200 mg Oral BID  . multivitamin  1 tablet Oral Daily  . pantoprazole  40 mg Oral Q1200   EXAM: Just back from therapy so still in wheelchair - limits exam In brace, and with bilat leg braces CVS-S1S2 No s3; 2/6 murmur RS- Clear ABD-Cannot examine EXT- no edema AVF patent Lab Results:  Basename 03/17/11 0500 03/16/11 1549 03/14/11 1855  WBC 12.4* 14.6* 16.3*  HGB 7.5* 7.9* 7.7*  HCT 24.4* 25.7* 24.5*  PLT 334 379 379   BMET  Basename 03/16/11 1549 03/14/11 1855  NA 133* 134*  K 3.9 4.5  CL 96 97  CO2 26 25  GLUCOSE 116* 132*  BUN 33* 40*  CREATININE 4.74* 5.26*  CALCIUM 9.1 8.8  PHOS 1.4* 1.8*   LFT  Basename 03/16/11 1549  PROT --  ALBUMIN 2.2*  AST --  ALT --  ALKPHOS --  BILITOT --  BILIDIR --  IBILI --   Lab Results  Component Value Date   IRON 35* 03/16/2011   TIBC 167* 03/16/2011   FERRITIN 2041* 03/11/2011  Transferrin saturation 21% ASSESSMENT?PLANS 1. ESRD - on HD on TTS @ [facility], for dialysis tomorrow on holiday schedule; 3 1/2 hours as  she says this is her outpt time 2. Abdominal abscess (after hemicolectomy surgery for ischemic bowel with ileostomy creation 02/09/11) - Right flank drain placed 12/21, body fluid culture from her drained intra-abdominal abscess showed E. Coli, currently on narrowed antibiotic spectrum with ciprofloxacin. She is afebrile ,WBC is decreasing, and imaging shows improvement in abscess.  3. Anemia - Hgb low (7's), on max Aranesp 200 mcg on Thurs. T sat a little low with high ferritin; will give 5 IV doses with HD then D/C 4. Secondary hyperparathyroidism - Off binders, low Phos persists; replete po X 2 days 5. Nutrition - on Nepro to supplement oral intake.  6. Lumbar radiculopathy:with bilateral lower extremity pain - her biggest complaint; meds d/c'd due to oversedation . Continue to monitor while on rehabilitation. (surgery with lumbar decompression by Dr. Sherwood Gambler was on 01/30/11 = original reason for hospital admission)  9. Left arm edema - Duplex  findings consistent with acute deep vein thrombosis involving the left upper extremity. Not felt to be anticoag candidate        LOS: 20 Chesley Valls B @TODAY @11 :06 AM

## 2011-03-17 NOTE — Progress Notes (Signed)
Patient ID: [name], female   DOB: September 12, 1950, 60 y.o.   MRN: [id number] Patient ID: [name], female   DOB: 21-Jun-1950, 60 y.o.   MRN: [id number] Patient ID: [name], female   DOB: 05-Oct-1950, 60 y.o.   MRN: [id number] Subjective/Complaints: Review of Systems  Musculoskeletal: Positive for back pain.  Left arm less swollen.  No new issues.  Objective: Vital Signs: Blood pressure 133/81, pulse 104, temperature 98.7 F (37.2 C), temperature source Oral, resp. rate 18, height 5\' 5"  (1.651 m), weight 50.8 kg (111 lb 15.9 oz), SpO2 98.00%.   Physical Exam:  General appearance: alert, cooperative and no distress Head: Normocephalic, without obvious abnormality, atraumatic Eyes: conjunctivae/corneas clear. PERRL, EOM's intact. Fundi benign. Ears: normal TM's and external ear canals both ears Nose: Nares normal. Septum midline. Mucosa normal. No drainage or sinus tenderness. Throat: lips, mucosa, and tongue normal; teeth and gums normal Neck: no adenopathy, no carotid bruit, no JVD, supple, symmetrical, trachea midline and thyroid not enlarged, symmetric, no tenderness/mass/nodules  R   IJ in place Back: symmetric, no curvature. ROM normal. No CVA tenderness. Resp: clear to auscultation bilaterally Cardio: regular rate and rhythm, S1, S2 normal, Gr 3/6 SEM  No  click, rub or gallop; rate 90-100 GI: soft, non-tender; bowel sounds normal; no masses,  no organomegaly;  colostomy noted Extremities: extremities normal, atraumatic, no cyanosis or edema slight edema distally only; AV fistula R arm.  LUE with 1 edema and mild pain in proximal arm.  No definite masses. LUE is warm with palpable pulses at wrist. Pulses: 2+ and symmetric Skin: Skin color, texture, turgor normal. No rashes or lesions Neurologic: continued foot drop tr/5 but pt denies sensory changes. Plantarflexion 3-4/5.  Generalized weakness in proximal legs 3/5.  Heel cords loose.  Remains alert Incision/Wound:  wounds all clean and intact.  Ostomy site sealed.     CBG (last 3)   Basename 03/09/11 1809  GLUCAP 88    Assessment/Plan: 1. Functional deficits secondary to lumbar stenosis with radiculopathy s/p decompression and ischemic colitis requiring colectomy which require 3+ hours per day of interdisciplinary therapy in a comprehensive inpatient rehab setting. Physiatrist is providing close team supervision and 24 hour management of active medical problems listed below. Physiatrist and rehab team continue to assess barriers to discharge/monitor patient progress toward functional and medical goals.    Mobility: Bed Mobility Bed Mobility: Yes Rolling Right: 4: Min assist;With rail Rolling Right Details (indicate cue type and reason): Cues to bend bil LEs and roll completely before pushign up to sit Left Sidelying to Sit: 3: Mod assist Left Sidelying to Sit Details (indicate cue type and reason): Increased effort and pain with coming up to sit'  Transfers Transfers: Yes Sit to Stand: 2: Max assist;With upper extremity assist;From chair/3-in-1 Stand to Sit: 2: Max assist;With upper extremity assist;To chair/3-in-1 Squat Pivot Transfers: 3: Mod assist Ambulation/Gait Ambulation/Gait Assistance: 4: Min assist Ambulation/Gait Assistance Details (indicate cue type and reason): A for LE lifting and placing due to bil LE foot drop, A for seaight shifting to unweight opposite LE, blocking knees Ambulation Distance (Feet): 8 Feet Assistive device: Rolling walker Gait Pattern: Decreased dorsiflexion - right;Decreased dorsiflexion - left;Right foot flat;Left foot flat;Right genu recurvatum;Left genu recurvatum;Trunk flexed;Right hip hike Stairs: No (Unsafe t this time) Architect: Yes Wheelchair Assistance: 4: Min Lexicographer: Both upper extremities Wheelchair Parts Management: Supervision/cueing (Cues for maintaining back precautions with parts  management) Distance: 150 ADL:  Cognition: Cognition Overall Cognitive Status: Appears within functional limits for tasks assessed Arousal/Alertness: Lethargic Orientation Level: Oriented to person;Oriented to place;Oriented to time Memory: Impaired (forgot date after 1 minute) Awareness: Appears intact Problem Solving: Appears intact Safety/Judgment: Appears intact Cognition Arousal/Alertness: Lethargic Orientation Level: Oriented to person;Oriented to place;Oriented to time  1.Lumbar stenosis with radiculopathy-S/P laminectomy Lumbar 4-5 and L 5 S1 11/14.Back corset when out of bed.Routine back precautions. Consider b/l AFO's for gait, but with edema presently, i'm not anxious to order. Edema control. Probably will try AFO's as an outpt.  2. DVT Prophylaxis/Anticoagulation: SCD,s/ and support hose. Elevate LUE--conservative care.  3. Pain Management: percocet.prn--- scheduled neurontin and percocet stopped.   4.Ischemic Colitis-S/P exp lap with right hemicolectomy and ileostomy 11/24.Provide ileostomy care and education. She is doing some ostomy care already on her own.   -wbc's down to 14k yesterday. On cipro for e coli.   -JP drainage decreased  -abdominal CT looks better.  Await further surgery input.  -d/c'd cenral line and culture negative  5. ESRD: dialysis as directed per renal services.Weigh patient daily.Follow up labs with dialysis.    6Chronic anemia-aranesp with HD.Follow up CBC with HD and transfuse if needed.   7.HTN-No present meds and monitor with increased activity.   8. AMS:  Has improved over the last 2 days.  The scheduled oxycodone and neurontin were stopped. Also think late night HD's and overall mental fatigue from this hospitalization are playing a role.       LOS (Days) [place] T

## 2011-03-18 ENCOUNTER — Inpatient Hospital Stay (HOSPITAL_COMMUNITY): Payer: Medicare Other

## 2011-03-18 LAB — CATH TIP CULTURE

## 2011-03-18 LAB — CBC
Hemoglobin: 7.7 g/dL — ABNORMAL LOW (ref 12.0–15.0)
MCH: 28.4 pg (ref 26.0–34.0)
MCV: 91.1 fL (ref 78.0–100.0)
RBC: 2.71 MIL/uL — ABNORMAL LOW (ref 3.87–5.11)

## 2011-03-18 MED ORDER — OXYCODONE-ACETAMINOPHEN 5-325 MG PO TABS
ORAL_TABLET | ORAL | Status: AC
  Administered 2011-03-18: 2 via ORAL
  Filled 2011-03-18: qty 2

## 2011-03-18 NOTE — Progress Notes (Signed)
Physical Therapy Note  Patient Details  Name: [name] MRN: [id number] Date of Birth: 1950/11/02 Today's Date: 03/18/2011  Time: 1415-1458 43 minutes  No c/o pain.  W/c mobility in controlled environment with supervision, frequent rest breaks due to fatigue.  Pt requires assist for w/c parts management to set up for transfers.  Sit <->stand training with min A. Standing tolerance 60 sec, 45 sec with min A, cues for posture.  Standing marching 2 x 30 sec with min-mod A with increased fatigue.  Nustep for LE strength and endurance x 10 minutes level 3.  Pt with good participation in therapy.  Individual therapy   Shenouda Genova 03/18/2011, 3:12 PM

## 2011-03-18 NOTE — Progress Notes (Signed)
Occupational Therapy Note  Patient Details  Name: [name] MRN: [id number] Date of Birth: 1950-12-09 Today's Date: 03/18/2011  [id number] - 78 Minutes Individual Therapy Patient complained of 8/10 pain throughout bilateral lower extremities; RN notified.    Treatment emphasis on ADL retraining at bed level, sit to stand position edge of bed. Focused skilled intervention on bed mobility (rolling left <-> right & supine -> sit without breaking back precautions), dynamic sitting balance/tolerance/endurance, increasing overall activity tolerance/endurance, UB/LB bathing, UB/LB dressing (LB dressing in sit -> stand position to pull pants up to waist), edge of bed -> w/c squat pivot transfer with close supervision, and grooming tasks seated in w/c at sink.   Kaisy Severino 03/18/2011, 8:27 AM

## 2011-03-18 NOTE — Progress Notes (Signed)
Patient is alert and oriented x3. She complained of generalized back pain and was given pain meds x2. JP Drain patent. Right arm AV shunt with positive thrill/bruit. Patient currently in dialysis.continue current plan of care.

## 2011-03-18 NOTE — Progress Notes (Signed)
Occupational Therapy Note  Patient Details  Name: DERRIONA WAGENBLAST MRN: [id number] Date of Birth: 17-Feb-1951 Today's Date: 03/18/2011  1130-1200 - 9 Minutes Individual Therapy No complaints of pain  Therapeutic activities focusing on furniture transfers, sit to stands from w/c, stand to sits -> w/c, increasing overall activity tolerance/endurance, and UB strengthening exercises.    Bessy Reaney 03/18/2011, 12:05 PM

## 2011-03-18 NOTE — Progress Notes (Signed)
11 Days Post-Op  Subjective: Patient just back from PT; no new c/o  Objective: Vital signs in last 24 hours: Temp:  [98.1 F (36.7 C)-98.8 F (37.1 C)] 98.8 F (37.1 C) (12/31 0500) Pulse Rate:  [97-99] 97  (12/31 0500) Resp:  [16-17] 17  (12/31 0500) BP: (99-119)/(66-76) 119/76 mmHg (12/31 0500) SpO2:  [98 %-99 %] 99 % (12/31 0500) Weight:  [114 lb 13.8 oz (52.1 kg)] 114 lb 13.8 oz (52.1 kg) (12/31 0500) Last BM Date: 03/17/11  Intake/Output from previous day: 12/30 0701 - 12/31 0700 In: 720 [P.O.:720] Out: 1060 [Drains:10; Stool:1050] Intake/Output this shift:    Right flank drain intact, insertion site ok , mildly tender, output minimal; f/u CT shows resolution of previous abscess/hematoma; case d/w Drs. Wyatt/Hoss- decision made to remove drain; rt flank drain removed in its entirety without immediate complications and gauze dressing applied to site.  Lab Results:   Basename 03/17/11 0500 03/16/11 1549  WBC 12.4* 14.6*  HGB 7.5* 7.9*  HCT 24.4* 25.7*  PLT 334 379   BMET  Basename 03/16/11 1549  NA 133*  K 3.9  CL 96  CO2 26  GLUCOSE 116*  BUN 33*  CREATININE 4.74*  CALCIUM 9.1   PT/INR No results found for this basename: LABPROT:2,INR:2 in the last 72 hours ABG No results found for this basename: PHART:2,PCO2:2,PO2:2,HCO3:2 in the last 72 hours  Studies/Results: No results found.  Anti-infectives: Anti-infectives     Start     Dose/Rate Route Frequency Ordered Stop   03/11/11 0800   ciprofloxacin (CIPRO) tablet 500 mg  Status:  Discontinued        500 mg Oral 2 times daily 03/11/11 0752 03/11/11 0754   03/11/11 0800   ciprofloxacin (CIPRO) tablet 500 mg        500 mg Oral Daily with breakfast 03/11/11 0754     03/09/11 2000   ertapenem (INVANZ) 0.5 g in sodium chloride 0.9 % 50 mL IVPB  Status:  Discontinued        500 mg 100 mL/hr over 30 Minutes Intravenous Every 24 hours 03/09/11 1702 03/11/11 0752   03/09/11 1800   ertapenem (INVANZ) 0.5 g in  sodium chloride 0.9 % 50 mL IVPB  Status:  Discontinued        500 mg 100 mL/hr over 30 Minutes Intravenous Every 24 hours 03/09/11 1658 03/09/11 1702   03/08/11 1100   metroNIDAZOLE (FLAGYL) IVPB 500 mg  Status:  Discontinued        500 mg 100 mL/hr over 60 Minutes Intravenous Every 8 hours 03/08/11 1036 03/11/11 0752   03/03/11 1200   ertapenem (INVANZ) 0.5 g in sodium chloride 0.9 % 50 mL IVPB  Status:  Discontinued        500 mg 100 mL/hr over 30 Minutes Intravenous Every 24 hours 03/03/11 1108 03/09/11 1658   03/03/11 1045   ertapenem (INVANZ) 1 g in sodium chloride 0.9 % 50 mL IVPB  Status:  Discontinued        1 g 100 mL/hr over 30 Minutes Intravenous Every 24 hours 03/03/11 1043 03/03/11 1107          Assessment/Plan: s/p right abdominal abscess/hematoma drainage 12/21,resolved; drain removed following discussion with Dr. Holland Commons).   LOS: 21 days    [name],D [facility] 03/18/2011

## 2011-03-18 NOTE — Progress Notes (Signed)
Physical Therapy Note  Patient Details  Name: [name] MRN: [id number] Date of Birth: 1950-10-18 Today's Date: 03/18/2011  9:30- 10:30 individual therapy 7/10 pain in back.  wc mobility 120' supervision with decreased functional speed. Car transfer with sliding board supervision with vc for board under thigh verses bottom performed bed mobility initially with mod assist for both legs. Pt taught to hook left leg with elbow and was able to perform sit to side x 3 with supervision. Sit to stand x 3 with min assist holding wc with assist needed to hold wc. Pt became total assist once fatigued. LE exercises for heel slides, bridging, hip abduction( AAROM) and SLR (AAROM) x 10 each.   Erle Crocker 03/18/2011, 10:51 AM

## 2011-03-18 NOTE — Progress Notes (Signed)
Subjective/Complaints: Review of Systems  Musculoskeletal: Positive for back pain.  Still with foot pain but slept 12-31  Objective: Vital Signs: Blood pressure 133/81, pulse 104, temperature 98.7 F (37.2 C), temperature source Oral, resp. rate 18, height 5\' 5"  (1.651 m), weight 50.8 kg (111 lb 15.9 oz), SpO2 98.00%.   Physical Exam:  General appearance: alert, cooperative and no distress Head: Normocephalic, without obvious abnormality, atraumatic Eyes: conjunctivae/corneas clear. PERRL, EOM's intact. Fundi benign. Ears: normal TM's and external ear canals both ears Nose: Nares normal. Septum midline. Mucosa normal. No drainage or sinus tenderness. Throat: lips, mucosa, and tongue normal; teeth and gums normal Neck: no adenopathy, no carotid bruit, no JVD, supple, symmetrical, trachea midline and thyroid not enlarged, symmetric, no tenderness/mass/nodules  R   IJ in place Back: symmetric, no curvature. ROM normal. No CVA tenderness. Resp: clear to auscultation bilaterally Cardio: regular rate and rhythm, S1, S2 normal, Gr 3/6 SEM  No  click, rub or gallop; rate 90-100 GI: soft, non-tender; bowel sounds normal; no masses,  no organomegaly;  colostomy noted Extremities: extremities normal, atraumatic, no cyanosis or edema slight edema distally only; AV fistula R arm.  LUE with tr to 1+ edema and minimal pain in proximal arm.  No definite masses. LUE is warm with palpable pulses at wrist. Pulses: 2+ and symmetric Skin: Skin color, texture, turgor normal. No rashes or lesions Neurologic: continued foot drop tr/5 but pt denies sensory changes. Plantarflexion 3-4/5.  Generalized weakness in proximal legs 3/5.  Heel cords loose.  Remains alert Incision/Wound: wounds all clean and intact.  Ostomy site sealed.   Exam 12/31   CBG (last 3)   Basename 03/09/11 1809  GLUCAP 88    Assessment/Plan: 1. Functional deficits secondary to lumbar stenosis with radiculopathy s/p decompression and  ischemic colitis requiring colectomy which require 3+ hours per day of interdisciplinary therapy in a comprehensive inpatient rehab setting. Physiatrist is providing close team supervision and 24 hour management of active medical problems listed below. Physiatrist and rehab team continue to assess barriers to discharge/monitor patient progress toward functional and medical goals.  Would like to work toward dc over the next few days.   Mobility: Bed Mobility Bed Mobility: Yes Rolling Right: 4: Min assist;With rail Rolling Right Details (indicate cue type and reason): Cues to bend bil LEs and roll completely before pushign up to sit Left Sidelying to Sit: 3: Mod assist Left Sidelying to Sit Details (indicate cue type and reason): Increased effort and pain with coming up to sit'  Transfers Transfers: Yes Sit to Stand: 2: Max assist;With upper extremity assist;From chair/3-in-1 Stand to Sit: 2: Max assist;With upper extremity assist;To chair/3-in-1 Squat Pivot Transfers: 3: Mod assist Ambulation/Gait Ambulation/Gait Assistance: 4: Min assist Ambulation/Gait Assistance Details (indicate cue type and reason): A for LE lifting and placing due to bil LE foot drop, A for seaight shifting to unweight opposite LE, blocking knees Ambulation Distance (Feet): 8 Feet Assistive device: Rolling walker Gait Pattern: Decreased dorsiflexion - right;Decreased dorsiflexion - left;Right foot flat;Left foot flat;Right genu recurvatum;Left genu recurvatum;Trunk flexed;Right hip hike Stairs: No (Unsafe t this time) Architect: Yes Wheelchair Assistance: 4: Min Lexicographer: Both upper extremities Wheelchair Parts Management: Supervision/cueing (Cues for maintaining back precautions with parts management) Distance: 150 ADL:   Cognition: Cognition Overall Cognitive Status: Appears within functional limits for tasks assessed Arousal/Alertness:  Lethargic Orientation Level: Oriented to person;Oriented to place;Oriented to time Memory: Impaired (forgot date after 1 minute) Awareness: Appears intact  Problem Solving: Appears intact Safety/Judgment: Appears intact Cognition Arousal/Alertness: Lethargic Orientation Level: Oriented to person;Oriented to place;Oriented to time  1.Lumbar stenosis with radiculopathy-S/P laminectomy Lumbar 4-5 and L 5 S1 11/14.Back corset when out of bed.Routine back precautions. Consider b/l AFO's for gait, but with edema presently, i'm not anxious to order. Edema control. Probably will try AFO's as an outpt.  2. DVT Prophylaxis/Anticoagulation: SCD,s/ and support hose. Elevate LUE--conservative care.  3. Pain Management: percocet.prn--- scheduled neurontin and percocet stopped due to ams.  4.Ischemic Colitis-S/P exp lap with right hemicolectomy and ileostomy 11/24.Provide ileostomy care and education. She is doing some ostomy care already on her own.   -wbc's down to 14k yesterday. On cipro for e coli.   -JP drainage decreased  -abdominal CT looks better.  Await further surgery input.  Pull drain today?  -d/c'd cenral line and culture negative  5. ESRD: dialysis as directed per renal services.Weigh patient daily.Follow up labs with dialysis.    6Chronic anemia-aranesp with HD.Follow up CBC with HD and transfuse if needed.   7.HTN-No present meds and monitor with increased activity.   8. AMS:  Has improved over the last 2 days.  The scheduled oxycodone and neurontin were stopped. Also think late night HD's and overall mental fatigue from this hospitalization are playing a role. She looks much better.       LOS (Days) [place] T

## 2011-03-18 NOTE — Progress Notes (Signed)
Subjective: Interval History:  Patient in therapies; face to face visit will be done in dialysis later today Interim history and medical records reviewed Per CIR attending "working toward d/c in the next few days  Objective:  Vital signs in last 24 hours:  Temp:  [98.1 F (36.7 C)-98.8 F (37.1 C)] 98.8 F (37.1 C) (12/31 0500) Pulse Rate:  [97-99] 97  (12/31 0500) Resp:  [16-17] 17  (12/31 0500) BP: (99-119)/(66-76) 119/76 mmHg (12/31 0500) SpO2:  [98 %-99 %] 99 % (12/31 0500) Weight:  [52.1 kg (114 lb 13.8 oz)] 114 lb 13.8 oz (52.1 kg) (12/31 0500) Weight change: -1.2 kg (-2 lb 10.3 oz)  Intake/Output: I/O last 3 completed shifts: In: 720 [P.O.:720] Out: 1520 [Drains:20; [id number]  Intake/Output this shift:   Lab Results:  Basename 03/17/11 0500 03/16/11 1549  WBC 12.4* 14.6*  HGB 7.5* 7.9*  HCT 24.4* 25.7*  PLT 334 379   BMET  Basename 03/16/11 1549  NA 133*  K 3.9  CL 96  CO2 26  GLUCOSE 116*  BUN 33*  CREATININE 4.74*  CALCIUM 9.1  PHOS 1.4*   Studies/Results: No results found.  Assessment/Plan: 1. ESRD - on HD on TTS @ [facility], for dialysis today on holiday schedule; 3 1/2 hours as she says this is her outpt time  2. Abdominal abscess (after hemicolectomy surgery for ischemic bowel with ileostomy creation 02/09/11) - Right flank drain placed 12/21, body fluid culture from her drained intra-abdominal abscess showed E. Coli, currently on narrowed antibiotic spectrum with ciprofloxacin. She is afebrile ,WBC is decreasing, and imaging shows improvement in abscess.  3. Anemia - Hgb low (7's), on max Aranesp 200 mcg on Thurs. T sat a little low with high ferritin; will give 5 IV doses with HD then D/C  4. Secondary hyperparathyroidism - Off binders, low Phos persists; replete po X 2 days with K phos started yest; f/u lab in HD today  5. Nutrition - on Nepro to supplement oral intake.  6. Lumbar radiculopathy:with bilateral lower extremity pain - her biggest  complaint; meds d/c'd due to oversedation . Continue to monitor while on rehabilitation. (surgery with lumbar decompression by Dr. Sherwood Gambler was on 01/30/11 = original reason for hospital admission)  7. Left arm edema - Duplex findings consistent with acute deep vein thrombosis involving the left upper extremity. Not felt to be anticoag candidate  8. Hypophosphatemia - see # 4   LOS: 21 Brooklynne Pereida B @TODAY @10 :25 AM

## 2011-03-19 DIAGNOSIS — Z5189 Encounter for other specified aftercare: Secondary | ICD-10-CM

## 2011-03-19 DIAGNOSIS — N186 End stage renal disease: Secondary | ICD-10-CM

## 2011-03-19 DIAGNOSIS — M47817 Spondylosis without myelopathy or radiculopathy, lumbosacral region: Secondary | ICD-10-CM

## 2011-03-19 DIAGNOSIS — IMO0002 Reserved for concepts with insufficient information to code with codable children: Secondary | ICD-10-CM

## 2011-03-19 LAB — RENAL FUNCTION PANEL
Albumin: 2.3 g/dL — ABNORMAL LOW (ref 3.5–5.2)
BUN: 15 mg/dL (ref 6–23)
CO2: 29 mEq/L (ref 19–32)
CO2: 30 mEq/L (ref 19–32)
Calcium: 9 mg/dL (ref 8.4–10.5)
Calcium: 9.3 mg/dL (ref 8.4–10.5)
Chloride: 97 mEq/L (ref 96–112)
Creatinine, Ser: 2.77 mg/dL — ABNORMAL HIGH (ref 0.50–1.10)
Creatinine, Ser: 3.51 mg/dL — ABNORMAL HIGH (ref 0.50–1.10)
GFR calc non Af Amer: 13 mL/min — ABNORMAL LOW (ref >90)
Phosphorus: 1.7 mg/dL — ABNORMAL LOW (ref 2.3–4.6)

## 2011-03-19 LAB — TRANSFERRIN: Transferrin: 141 mg/dL — ABNORMAL LOW (ref 200–360)

## 2011-03-19 NOTE — Progress Notes (Signed)
Subjective:  No new complaints.  Looks much better than the last time i saw her.  Patient says she is going home tomorrow, perc drain is out Objective Vital signs in last 24 hours: Filed Vitals:   03/18/11 2104 03/18/11 2143 03/19/11 0427 03/19/11 0500  BP: 153/60 130/64 127/79   Pulse: 128 128 101   Temp:  98 F (36.7 C) 98.9 F (37.2 C)   TempSrc:  Oral Oral   Resp: 18 18 18    Height:      Weight:   52.2 kg (115 lb 1.3 oz) 52.2 kg (115 lb 1.3 oz)  SpO2: 100% 100% 100%    Weight change: 2.4 kg (5 lb 4.7 oz)  Intake/Output Summary (Last 24 hours) at 03/19/11 1251 Last data filed at 03/19/11 0900  Gross per 24 hour  Intake    600 ml  Output   1669 ml  Net  -1069 ml   Labs: Basic Metabolic Panel:  Lab [id number] 0712 03/18/11 2038 03/16/11 1549  NA 140 136 133*  K 4.0 3.9 3.9  CL 101 97 96  CO2 30 29 26   GLUCOSE 84 105* 116*  BUN 18 15 33*  CREATININE 3.51* 2.77* 4.74*  CALCIUM 9.3 9.0 9.1  ALB -- -- --  PHOS 1.7* 1.1* 1.4*   Liver Function Tests:  Lab 03/19/11 0712 03/18/11 2038 03/16/11 1549  AST -- -- --  ALT -- -- --  ALKPHOS -- -- --  BILITOT -- -- --  PROT -- -- --  ALBUMIN 2.3* 2.3* 2.2*   No results found for this basename: LIPASE:3,AMYLASE:3 in the last 168 hours No results found for this basename: AMMONIA:3 in the last 168 hours CBC:  Lab 03/18/11 2038 03/17/11 0500 03/16/11 1549 03/14/11 1855  WBC 12.5* 12.4* 14.6* --  NEUTROABS -- -- -- --  HGB 7.7* 7.5* 7.9* --  HCT 24.7* 24.4* 25.7* --  MCV 91.1 88.4 88.0 88.1  PLT 300 334 379 --   Cardiac Enzymes: No results found for this basename: CKTOTAL:5,CKMB:5,CKMBINDEX:5,TROPONINI:5 in the last 168 hours CBG:  Lab 03/14/11 2246 03/14/11 1617 03/13/11 2103 03/13/11 1625  GLUCAP 94 118* 127* 139*    Iron Studies:  Basename 03/16/11 1549  IRON 35*  TIBC 167*  TRANSFERRIN 141*  FERRITIN --   Studies/Results: No results found. Medications: Infusions:    Scheduled Medications:    .  ciprofloxacin  500 mg Oral Q breakfast  . darbepoetin (ARANESP) injection - DIALYSIS  200 mcg Intravenous Q Thu-HD  . feeding supplement (NEPRO CARB STEADY)  237 mL Oral TID WC  . ferric gluconate (FERRLECIT/NULECIT) IV  125 mg Intravenous Q T,Th,Sa-HD  . megestrol  200 mg Oral BID  . multivitamin  1 tablet Oral Daily  . pantoprazole  40 mg Oral Q1200  . potassium & sodium phosphates  1 packet Oral BID    have reviewed scheduled and prn medications.  Physical Exam: General: looks great Heart: RRR Lungs: clr Abdomen: soft, NT Extremities: no edema Dialysis Access: avg   I Assessment/ Plan: Pt is a 61 y.o. yo female ESRD who was admitted on 02/25/2011 with  After prolonged hospitalization laminectomy and ischemic colitis  Assessment/Plan: 1. ESRD - on HD on TTS @ [facility], next due Thursday, if goes home will be at OP unit 2. Abdominal abscess (after hemicolectomy surgery for ischemic bowel with ileostomy creation 02/09/11) - Right flank drain placed 12/21, body fluid culture from her drained intra-abdominal abscess showed E. Coli, currently on  ciprofloxacin. She is afebrile ,WBC is decreasing, and imaging shows improvement in abscess, drain now out 3. Anemia - Hgb low (7's), on max Aranesp 200 mcg on Thurs.short term iron as well 4. Secondary hyperparathyroidism - Off binders, low Phos persists;should improve once on home diet  5. Nutrition - on Nepro to supplement oral intake.  6. Lumbar radiculopathy:with bilateral lower extremity pain - her biggest complaint; meds d/c'd due to oversedation . Continue to monitor while on rehabilitation. (surgery with lumbar decompression by Dr. Sherwood Gambler was on 01/30/11 = original reason for hospital admission) PT 7. Left arm edema - Duplex findings consistent with acute deep vein thrombosis involving the left upper extremity. Not felt to be anticoag candidate  8. For d/c tomorrow, we will communicate with OP HD unit   . [name]  A   03/19/2011,12:51 PM  LOS: 22 days

## 2011-03-19 NOTE — Progress Notes (Signed)
Hemodialysis pt. Pt. anuria. Ileostomy handout given to pt and family for review. Demonstrated removal, appropriate measurement, and application of new ostomy pouch. After demonstration, daughter asked if pt felt comfortable providing ostomy care at home. Daughter verbalize physical discomfort with the procedure, and stated ' you may have to be the one that does it a home'. Pt stated comfortableness with the procedure, but felt that she would need 'more hands on', prior to discharge. Pt requested additional education of ostomy care from wound nurse. Bilateral lower extremity with 1+ edema. Visible evident of foot drop. Continuing to refuse prafo boots. HD fistula positive for bruit and thrill.  IJ cath removed with area healing appropriately. Small scab to R flank after JP drain removal with drsg clean, dry, intact. Requesting prn percocet q 4hrs.

## 2011-03-19 NOTE — Progress Notes (Signed)
Patient ID: [name], female   DOB: 1950/05/24, 61 y.o.   MRN: [id number] Subjective/Complaints: Review of Systems  Musculoskeletal: Positive for back pain.  Still with foot pain but slept 12-31  Objective: Vital Signs: Blood pressure 133/81, pulse 104, temperature 98.7 F (37.2 C), temperature source Oral, resp. rate 18, height 5\' 5"  (1.651 m), weight 50.8 kg (111 lb 15.9 oz), SpO2 98.00%.   Physical Exam:  General appearance: alert, cooperative and no distress Head: Normocephalic, without obvious abnormality, atraumatic Eyes: conjunctivae/corneas clear. PERRL, EOM's intact. Fundi benign. Ears: normal TM's and external ear canals both ears Nose: Nares normal. Septum midline. Mucosa normal. No drainage or sinus tenderness. Throat: lips, mucosa, and tongue normal; teeth and gums normal Neck: no adenopathy, no carotid bruit, no JVD, supple, symmetrical, trachea midline and thyroid not enlarged, symmetric, no tenderness/mass/nodules  R   IJ in place Back: symmetric, no curvature. ROM normal. No CVA tenderness. Resp: clear to auscultation bilaterally Cardio: regular rate and rhythm, S1, S2 normal, Gr 3/6 SEM  No  click, rub or gallop; rate 90-100 GI: soft, non-tender; bowel sounds normal; no masses,  no organomegaly;  colostomy noted Extremities: extremities normal, atraumatic, no cyanosis or edema slight edema distally only; AV fistula R arm.  LUE with tr to 1+ edema and minimal pain in proximal arm.  No definite masses. LUE is warm with palpable pulses at wrist. Pulses: 2+ and symmetric Skin: Skin color, texture, turgor normal. No rashes or lesions Neurologic: continued foot drop tr/5 but pt denies sensory changes. Plantarflexion 3-4/5.  Generalized weakness in proximal legs 3/5.  Heel cords loose.  Remains alert Incision/Wound: wounds all clean and intact.  Ostomy site sealed.   Exam 12/31   CBG (last 3)   Basename 03/09/11 1809  GLUCAP 88    Assessment/Plan: 1. Functional  deficits secondary to lumbar stenosis with radiculopathy s/p decompression and ischemic colitis requiring colectomy which require 3+ hours per day of interdisciplinary therapy in a comprehensive inpatient rehab setting. Physiatrist is providing close team supervision and 24 hour management of active medical problems listed below. Physiatrist and rehab team continue to assess barriers to discharge/monitor patient progress toward functional and medical goals.  Would like to work toward dc over the next few days.   Mobility: Bed Mobility Bed Mobility: Yes Rolling Right: 4: Min assist;With rail Rolling Right Details (indicate cue type and reason): Cues to bend bil LEs and roll completely before pushign up to sit Left Sidelying to Sit: 3: Mod assist Left Sidelying to Sit Details (indicate cue type and reason): Increased effort and pain with coming up to sit'  Transfers Transfers: Yes Sit to Stand: 2: Max assist;With upper extremity assist;From chair/3-in-1 Stand to Sit: 2: Max assist;With upper extremity assist;To chair/3-in-1 Squat Pivot Transfers: 3: Mod assist Ambulation/Gait Ambulation/Gait Assistance: 4: Min assist Ambulation/Gait Assistance Details (indicate cue type and reason): A for LE lifting and placing due to bil LE foot drop, A for seaight shifting to unweight opposite LE, blocking knees Ambulation Distance (Feet): 8 Feet Assistive device: Rolling walker Gait Pattern: Decreased dorsiflexion - right;Decreased dorsiflexion - left;Right foot flat;Left foot flat;Right genu recurvatum;Left genu recurvatum;Trunk flexed;Right hip hike Stairs: No (Unsafe t this time) Architect: Yes Wheelchair Assistance: 4: Min Lexicographer: Both upper extremities Wheelchair Parts Management: Supervision/cueing (Cues for maintaining back precautions with parts management) Distance: 150 ADL:   Cognition: Cognition Overall Cognitive Status: Appears  within functional limits for tasks assessed Arousal/Alertness: Lethargic Orientation Level: Oriented  to person;Oriented to place;Oriented to time Memory: Impaired (forgot date after 1 minute) Awareness: Appears intact Problem Solving: Appears intact Safety/Judgment: Appears intact Cognition Arousal/Alertness: Lethargic Orientation Level: Oriented to person;Oriented to place;Oriented to time  1.Lumbar stenosis with radiculopathy-S/P laminectomy Lumbar 4-5 and L 5 S1 11/14.Back corset when out of bed.Routine back precautions. Consider b/l AFO's for gait, but with pedal edema presently Edema control. Probably will try AFO's as an outpt.  2. DVT Prophylaxis/Anticoagulation: SCD,s/ and support hose. Elevate LUE--conservative care.  3. Pain Management: percocet.prn--- scheduled neurontin and percocet stopped due to ams.  4.Ischemic Colitis-S/P exp lap with right hemicolectomy and ileostomy 11/24.Provide ileostomy care and education. She is doing some ostomy care already on her own.   -wbc's down to 14k yesterday. On cipro for e coli.   -JP drainage decreased  -abdominal CT looks better.  Await further surgery input.  Pull drain today?  -d/c'd cenral line and culture negative  5. ESRD: dialysis as directed per renal services.Weigh patient daily.Follow up labs with dialysis.    6Chronic anemia-aranesp with HD.Follow up CBC with HD and transfuse if needed.   7.HTN-No present meds and monitor with increased activity.   8. AMS:  Has improved over the last 2 days.  The scheduled oxycodone and neurontin were stopped. Also think late night HD's and overall mental fatigue from this hospitalization are playing a role. She looks much better.       LOS (Days) [place] T

## 2011-03-20 DIAGNOSIS — Z5189 Encounter for other specified aftercare: Secondary | ICD-10-CM

## 2011-03-20 DIAGNOSIS — N186 End stage renal disease: Secondary | ICD-10-CM

## 2011-03-20 DIAGNOSIS — M47817 Spondylosis without myelopathy or radiculopathy, lumbosacral region: Secondary | ICD-10-CM

## 2011-03-20 DIAGNOSIS — IMO0002 Reserved for concepts with insufficient information to code with codable children: Secondary | ICD-10-CM

## 2011-03-20 MED ORDER — PANTOPRAZOLE SODIUM 40 MG PO TBEC: 40.0000 mg | DELAYED_RELEASE_TABLET | Freq: Every day | ORAL | Status: DC

## 2011-03-20 MED ORDER — RENA-VITE PO TABS: 1.0000 | ORAL_TABLET | Freq: Every day | ORAL | Status: DC

## 2011-03-20 MED ORDER — HEPARIN SODIUM (PORCINE) 1000 UNIT/ML DIALYSIS
20.0000 [IU]/kg | INTRAMUSCULAR | Status: DC | PRN
  Filled 2011-03-20: qty 2

## 2011-03-20 MED ORDER — OXYCODONE-ACETAMINOPHEN 5-325 MG PO TABS: 1.0000 | ORAL_TABLET | ORAL | Status: AC | PRN

## 2011-03-20 NOTE — Progress Notes (Signed)
13 Days Post-Op  Subjective: Pt ok. Seen in PT Drain removed a few days ago. Remains afebrile and WBC stable. She denies any recurrent pain aside from her usual back pain.  Objective: Vital signs in last 24 hours: Temp:  [98.6 F (37 C)-98.8 F (37.1 C)] 98.6 F (37 C) (01/02 0531) Pulse Rate:  [96-110] 96  (01/02 0531) Resp:  [18-19] 18  (01/02 0531) BP: (113-135)/(74-80) 135/80 mmHg (01/02 0531) SpO2:  [98 %-99 %] 98 % (01/02 0531) Weight:  [53.5 kg (117 lb 15.1 oz)] 117 lb 15.1 oz (53.5 kg) (01/02 0531) Last BM Date: 03/19/11  Intake/Output this shift:    Physical Exam: BP 135/80  Pulse 96  Temp(Src) 98.6 F (37 C) (Oral)  Resp 18  Ht 5\' 5"  (1.651 m)  Wt 53.5 kg (117 lb 15.1 oz)  BMI 19.63 kg/m2  SpO2 98% Abdomen: soft, NT, ND  Labs: CBC  Basename 03/18/11 2038  WBC 12.5*  HGB 7.7*  HCT 24.7*  PLT 300   BMET  Basename 03/19/11 0712 03/18/11 2038  NA 140 136  K 4.0 3.9  CL 101 97  CO2 30 29  GLUCOSE 84 105*  BUN 18 15  CREATININE 3.51* 2.77*  CALCIUM 9.3 9.0   LFT  Basename 03/19/11 0712  PROT --  ALBUMIN 2.3*  AST --  ALT --  ALKPHOS --  BILITOT --  BILIDIR --  IBILI --  LIPASE --   PT/INR No results found for this basename: LABPROT:2,INR:2 in the last 72 hours ABG No results found for this basename: PHART:2,PCO2:2,PO2:2,HCO3:2 in the last 72 hours  Studies/Results: No results found.  Assessment: Principal Problem:  *Radiculopathy of lumbar region S/p (R)colectomy with end ileostomy Post-op abscess s/p perc drain with resolution.  Procedure(s): COLONOSCOPY  Plan: Looks good from surgical standpoint Plan for DC tomorrow, will need to get follow up with Dr. Ninfa Linden in a few weeks.  LOS: 23 days    Federico Flake 03/20/2011

## 2011-03-20 NOTE — Progress Notes (Signed)
Patient alert and oriented x 3 - some memory deficits noted. Patient able to demonstrate emptying pouch and securing clamp. Patient able to verbalize things to watch for in pouch and when to notify their doctor. Patient pain only managed to level 8 out of 10 with Percocet at 1040 and 1 percocet at 1440. Spoke with wound ostomy nurse [name] reported family had received ostomy care instructions on changing pouch and was prepared at the last visit. Patient graft to RUA positive bruit/thrill. Patient has scab to RLQ with dry dressing intact. Patient plan to d/c after HD tomorrow. Patient appetite fair. No new complaints noted. Continue with plan of care.

## 2011-03-20 NOTE — Plan of Care (Signed)
Problem: RH PAIN MANAGEMENT Goal: RH STG PAIN MANAGED AT OR BELOW PT'S PAIN GOAL Pain kept at tolerable level for patient comfort. Goal of 3/10.  Outcome: Not Progressing Patient continues to report pain around 7-8 at all times- reports pain to be aching and tingling around feet and legs.

## 2011-03-20 NOTE — Progress Notes (Signed)
Occupational Therapy Discharge Summary & Session Notes  Patient Details  Name: [name] MRN: [id number] Date of Birth: [birth year] Today's Date: 03/20/2011  Session Notes  Session #1 [id number] - 28 Minutes Individual Therapy Patient complained of 8/10 pain; RN notified. Engaged in ADL retraining at bed level; sitting edge of bed. Focused skilled intervention on increasing overall activity tolerance/endurance, bed mobility, UB/LB bathing & dressing, dynamic sitting balance/tolerance/endurance, adhering to back precautions throughout ADL (patient able to verbalize and adhere to 3/3 back precautions). Patient with more than reasonable amount of time to complete ADL tasks.    Session #2 [id number] - 30 Minutes Individual Therapy No complaints of pain Engaged in simulated tub/shower transfer onto tub transfer bench w/c <-> tub bench with close supervision from therapist, patient required supervision for safety and supervision for set-up of w/c. Also completed w/c -> simulated bed transfer (simulated like bed height at home) with minimal assist from therapist and focused on back precautions during bed mobility. Daughter not present for therapy session.    Discharge Summary Patient has met 3 of 3 long term goals due to improved activity tolerance, improved balance, postural control, ability to compensate for deficits, improved attention, improved awareness and improved coordination. Patient at an overall supervision level for aspects of daily living except patient requires minimal assistance to pull pants up to waist. Recommend patient complete ADLs in a seated position, performing lateral leans to pull pants up to waist. Patient's care partner is independent to provide the necessary supervision at discharge.  Patients daughter was present for a 30 minute OT session ~ 1 week ago for focus on donning/doffing back brace, back precautions, and bed mobility. Also verbalized ADL education with  daughter regarding safety and independence with ADLs. Patients daughter verbalized understanding.   See FIM and Discharge Navigator for Objective and Subjective Measures.   Recommendation:  Patient will continue to received OT services in a home health setting to continue to advance functional skills in the area of ADLs & IADLs.   Equipment: Equipment provided: tub transfer bench  Patient/family agrees with progress made and goals achieved: Yes  [name] 03/20/2011, 9:18 AM

## 2011-03-20 NOTE — Progress Notes (Signed)
Physical Therapy Treatment Note & Discharge Summary  Patient Details  Name: [name] MRN: [id number] Date of Birth: 05/15/1950 Today's Date: 03/20/2011  Treatment #1: Individual therapy [phone] (60 minutes) Complaint of pain in feet, notified RN regarding pain medicine. Treatment session focused on functional transfers, neuro-reeducation for lower extremities for strengthening, sit to stands, postural control in standing, and gait short distances with RW (5', 10') with overall min/modA. Use of mirror for visual feedback for pt's posture in standing and verbal & manual facilitation for erect posture and hip extension. W/c propulsion x 150' on unit for general activity tolerance and general UE strengthening.   Treatment #2: Individual therapy 1330-1410 (40 minutes) Denies pain. No family present for family education, pt stated she thought her daughter was supposed to come this afternoon.Daughter was here on Friday and completed transfer training with and without slideboard. Simulated car transfer with close supervision once with slideboard and once squat pivot. Cues for safety and set up needed. Standing therex for functional hip strengthening with mini squats 2 sets of 10. Gave out and reviewed HEP for lower extremity strengthening.  Patient has met 6 of 6 long term goals due to improved activity tolerance, improved balance, improved postural control and ability to compensate for deficits.  Patient to discharge at a wheelchair level Supervision. Gait has been limited due to pt strength, multiple procedures while on rehab, and increased swelling in bilateral feet limiting ability to tolerate AFO's. Pt able to gait short distances with min/modA overall with use of RW and recommend follow up therapy to continue to work on this. Do not recommend gait in the home at this time. Pt is currently transferring with squat pivot technique and/or use of slideboard depending on level of fatigue and various  surfaces (ex. Car). Patient's care partner (daugher)  is independent to provide the necessary physical assistance at discharge.  Recommendation:  Patient will benefit from ongoing skilled PT services in home health setting to continue to advance safe functional mobility, address ongoing impairments in gait, balance, strength, edema, activity tolerance, postural control, and motor control, and minimize fall risk.  Equipment: Equipment provided: w/c with elevating leg rests, slideboard  Patient/family agrees with progress made and goals achieved: Yes  Allayne Gitelman 03/20/2011, 10:41 AM

## 2011-03-20 NOTE — Progress Notes (Signed)
Subjective:  No new complaints.  Looks much better than the last time i saw her.  Patient says now she is going home tomorrow 1/3,  perc drain is out Objective Vital signs in last 24 hours: Filed Vitals:   03/19/11 0427 03/19/11 0500 03/19/11 1700 03/20/11 0531  BP: 127/79  113/74 135/80  Pulse: 101  110 96  Temp: 98.9 F (37.2 C)  98.8 F (37.1 C) 98.6 F (37 C)  TempSrc: Oral  Oral Oral  Resp: 18  19 18   Height:      Weight: 52.2 kg (115 lb 1.3 oz) 52.2 kg (115 lb 1.3 oz)  53.5 kg (117 lb 15.1 oz)  SpO2: 100%  99% 98%   Weight change: -1 kg (-2 lb 3.3 oz)  Intake/Output Summary (Last 24 hours) at 03/20/11 1236 Last data filed at 03/20/11 0500  Gross per 24 hour  Intake    240 ml  Output    300 ml  Net    -60 ml   Labs: Basic Metabolic Panel:  Lab [id number] 0712 03/18/11 2038 03/16/11 1549  NA 140 136 133*  K 4.0 3.9 3.9  CL 101 97 96  CO2 30 29 26   GLUCOSE 84 105* 116*  BUN 18 15 33*  CREATININE 3.51* 2.77* 4.74*  CALCIUM 9.3 9.0 9.1  ALB -- -- --  PHOS 1.7* 1.1* 1.4*   Liver Function Tests:  Lab 03/19/11 0712 03/18/11 2038 03/16/11 1549  AST -- -- --  ALT -- -- --  ALKPHOS -- -- --  BILITOT -- -- --  PROT -- -- --  ALBUMIN 2.3* 2.3* 2.2*   No results found for this basename: LIPASE:3,AMYLASE:3 in the last 168 hours No results found for this basename: AMMONIA:3 in the last 168 hours CBC:  Lab 03/18/11 2038 03/17/11 0500 03/16/11 1549 03/14/11 1855  WBC 12.5* 12.4* 14.6* --  NEUTROABS -- -- -- --  HGB 7.7* 7.5* 7.9* --  HCT 24.7* 24.4* 25.7* --  MCV 91.1 88.4 88.0 88.1  PLT 300 334 379 --   Cardiac Enzymes: No results found for this basename: CKTOTAL:5,CKMB:5,CKMBINDEX:5,TROPONINI:5 in the last 168 hours CBG:  Lab 03/14/11 2246 03/14/11 1617 03/13/11 2103 03/13/11 1625  GLUCAP 94 118* 127* 139*    Iron Studies: No results found for this basename: IRON,TIBC,TRANSFERRIN,FERRITIN in the last 72 hours Studies/Results: No results  found. Medications: Infusions:    Scheduled Medications:    . ciprofloxacin  500 mg Oral Q breakfast  . darbepoetin (ARANESP) injection - DIALYSIS  200 mcg Intravenous Q Thu-HD  . feeding supplement (NEPRO CARB STEADY)  237 mL Oral TID WC  . ferric gluconate (FERRLECIT/NULECIT) IV  125 mg Intravenous Q T,Th,Sa-HD  . megestrol  200 mg Oral BID  . multivitamin  1 tablet Oral Daily  . pantoprazole  40 mg Oral Q1200    have reviewed scheduled and prn medications.  Physical Exam: General: looks great Heart: RRR Lungs: clr Abdomen: soft, NT Extremities: no edema Dialysis Access: avg   I Assessment/ Plan: Pt is a 61 y.o. yo female ESRD who was admitted on 02/25/2011 withprolonged hospitalization laminectomy and ischemic colitis  Assessment/Plan: 1. ESRD - on HD on TTS @ [facility], next tomorrow, will do first shift to get ready for d/c tomorrow. 2. Abdominal abscess (after hemicolectomy surgery for ischemic bowel with ileostomy creation 02/09/11) -body fluid culture from her drained intra-abdominal abscess showed E. Coli, currently on ciprofloxacin. She is afebrile ,WBC is decreasing, and imaging shows improvement  in abscess, perc drain placed, now out 3. Anemia - Hgb low (7's), on max Aranesp 200 mcg on Thurs.short term iron as well 4. Secondary hyperparathyroidism - Off binders, low Phos persists;should improve once on home diet  5. Nutrition - on Nepro to supplement oral intake.  6. Lumbar radiculopathy:with bilateral lower extremity pain - her biggest complaint; meds d/c'd due to oversedation . Continue to monitor while on rehabilitation. (surgery with lumbar decompression by Dr. Sherwood Gambler was on 01/30/11 = original reason for hospital admission) PT 7. Left arm edema - Duplex findings consistent with acute deep vein thrombosis involving the left upper extremity. Not felt to be anticoag candidate  8. For d/c tomorrow, we will communicate with OP HD unit   . Nohely Whitehorn  A   03/20/2011,12:36 PM  LOS: 23 days

## 2011-03-20 NOTE — Plan of Care (Signed)
Problem: RH SKIN INTEGRITY Goal: RH STG SKIN FREE OF INFECTION/BREAKDOWN Mod assist with skin care. Skin free of infection.  Outcome: Adequate for Discharge Patient will requires family member to assist with dressing at home due to location

## 2011-03-20 NOTE — Progress Notes (Addendum)
Patient ID: [name], female   DOB: [birth year], 61 y.o.   MRN: [id number] Patient ID: [name], female   DOB: 1950-04-19, 61 y.o.   MRN: [id number] Subjective/Complaints: Review of Systems  Musculoskeletal: Positive for back pain.  Still with foot pain but tolerating it 1/2  Objective: Vital Signs: Blood pressure 133/81, pulse 104, temperature 98.7 F (37.2 C), temperature source Oral, resp. rate 18, height 5\' 5"  (1.651 m), weight 50.8 kg (111 lb 15.9 oz), SpO2 98.00%.   Physical Exam:  General appearance: alert, cooperative and no distress Head: Normocephalic, without obvious abnormality, atraumatic Eyes: conjunctivae/corneas clear. PERRL, EOM's intact. Fundi benign. Ears: normal TM's and external ear canals both ears Nose: Nares normal. Septum midline. Mucosa normal. No drainage or sinus tenderness. Throat: lips, mucosa, and tongue normal; teeth and gums normal Neck: no adenopathy, no carotid bruit, no JVD, supple, symmetrical, trachea midline and thyroid not enlarged, symmetric, no tenderness/mass/nodules  R   IJ in place Back: symmetric, no curvature. ROM normal. No CVA tenderness. Resp: clear to auscultation bilaterally Cardio: regular rate and rhythm, S1, S2 normal, Gr 3/6 SEM  No  click, rub or gallop; rate 90-100 GI: soft, non-tender; bowel sounds normal; no masses,  no organomegaly;  colostomy noted Extremities: extremities normal, atraumatic, no cyanosis or edema slight edema distally only; AV fistula R arm.  LUE with tr to tr edema and minimal pain in proximal arm.  No definite masses. LUE is warm with palpable pulses at wrist. Minimal LE edema. Pulses: 2+ and symmetric Skin: Skin color, texture, turgor normal. No rashes or lesions Neurologic: continued foot drop tr/5 but pt denies sensory changes. Plantarflexion 3-4/5.  Generalized weakness in proximal legs 3/5.  Heel cords loose.  Remains alert Incision/Wound: wounds all clean and intact.  Ostomy site sealed.     Exam 1/2   CBG (last 3)   Basename 03/09/11 1809  GLUCAP 88    Assessment/Plan: 1. Functional deficits secondary to lumbar stenosis with radiculopathy s/p decompression and ischemic colitis requiring colectomy which require 3+ hours per day of interdisciplinary therapy in a comprehensive inpatient rehab setting. Physiatrist is providing close team supervision and 24 hour management of active medical problems listed below. Physiatrist and rehab team continue to assess barriers to discharge/monitor patient progress toward functional and medical goals.  I think we can d/c home pending dispo issues.  Will discuss with SW/CM   Mobility: Bed Mobility Bed Mobility: Yes Rolling Right: 4: Min assist;With rail Rolling Right Details (indicate cue type and reason): Cues to bend bil LEs and roll completely before pushign up to sit Left Sidelying to Sit: 3: Mod assist Left Sidelying to Sit Details (indicate cue type and reason): Increased effort and pain with coming up to sit'  Transfers Transfers: Yes Sit to Stand: 2: Max assist;With upper extremity assist;From chair/3-in-1 Stand to Sit: 2: Max assist;With upper extremity assist;To chair/3-in-1 Squat Pivot Transfers: 3: Mod assist Ambulation/Gait Ambulation/Gait Assistance: 4: Min assist Ambulation/Gait Assistance Details (indicate cue type and reason): A for LE lifting and placing due to bil LE foot drop, A for seaight shifting to unweight opposite LE, blocking knees Ambulation Distance (Feet): 8 Feet Assistive device: Rolling walker Gait Pattern: Decreased dorsiflexion - right;Decreased dorsiflexion - left;Right foot flat;Left foot flat;Right genu recurvatum;Left genu recurvatum;Trunk flexed;Right hip hike Stairs: No (Unsafe t this time) Product manager Mobility: Yes Wheelchair Assistance: 4: Min Lexicographer: Both upper extremities Wheelchair Parts Management: Supervision/cueing (Cues for maintaining  back precautions with  parts management) Distance: 150 ADL:   Cognition: Cognition Overall Cognitive Status: Appears within functional limits for tasks assessed Arousal/Alertness: Lethargic Orientation Level: Oriented to person;Oriented to place;Oriented to time Memory: Impaired (forgot date after 1 minute) Awareness: Appears intact Problem Solving: Appears intact Safety/Judgment: Appears intact Cognition Arousal/Alertness: Lethargic Orientation Level: Oriented to person;Oriented to place;Oriented to time  1.Lumbar stenosis with radiculopathy-S/P laminectomy Lumbar 4-5 and L 5 S1 11/14.Back corset when out of bed.Routine back precautions. Consider b/l AFO's for gait, but with pedal edema presently Edema control.  AFO's as an outpt.  2. DVT Prophylaxis/Anticoagulation: SCD,s/ and support hose. Elevate LUE--conservative care.  3. Pain Management: percocet.prn--- scheduled neurontin and percocet stopped due to ams.  4.Ischemic Colitis-S/P exp lap with right hemicolectomy and ileostomy 11/24.Provide ileostomy care and education. She is doing some ostomy care already on her own.   -wbc's down to 14k yesterday. On cipro for e coli.   -JP drain removed  -abdominal CT looks better.  -d/c'd cenral line and culture negative  5. ESRD: dialysis as directed per renal services.Weigh patient daily.Follow up labs with dialysis.    6Chronic anemia-aranesp with HD.Follow up CBC with HD and transfuse if needed.   7.HTN-No present meds and monitor with increased activity.   8. AMS:  Has improved over the last 2 days.  The scheduled oxycodone and neurontin were stopped. Also think late night HD's and overall mental fatigue from this hospitalization are playing a role. She looks much better.       LOS (Days) [place] T

## 2011-03-21 ENCOUNTER — Inpatient Hospital Stay (HOSPITAL_COMMUNITY): Payer: Medicare Other

## 2011-03-21 LAB — RENAL FUNCTION PANEL
Albumin: 2.4 g/dL — ABNORMAL LOW (ref 3.5–5.2)
BUN: 47 mg/dL — ABNORMAL HIGH (ref 6–23)
Calcium: 9.8 mg/dL (ref 8.4–10.5)
Creatinine, Ser: 6.35 mg/dL — ABNORMAL HIGH (ref 0.50–1.10)
Phosphorus: 2 mg/dL — ABNORMAL LOW (ref 2.3–4.6)

## 2011-03-21 LAB — CBC
HCT: 26.3 % — ABNORMAL LOW (ref 36.0–46.0)
MCHC: 31.2 g/dL (ref 30.0–36.0)
MCV: 91.6 fL (ref 78.0–100.0)
RDW: 20.9 % — ABNORMAL HIGH (ref 11.5–15.5)

## 2011-03-21 MED ORDER — DARBEPOETIN ALFA-POLYSORBATE 200 MCG/0.4ML IJ SOLN
INTRAMUSCULAR | Status: AC
  Filled 2011-03-21: qty 0.4

## 2011-03-21 MED ORDER — CIPROFLOXACIN HCL 500 MG PO TABS: 500.0000 mg | ORAL_TABLET | Freq: Every day | ORAL | Status: AC

## 2011-03-21 MED ORDER — OXYCODONE-ACETAMINOPHEN 5-325 MG PO TABS
ORAL_TABLET | ORAL | Status: AC
  Administered 2011-03-21: 1 via ORAL
  Filled 2011-03-21: qty 1

## 2011-03-21 NOTE — Progress Notes (Signed)
Social Work Discharge Note Discharge Note  The overall goal for the admission was met for:   Discharge location: [place] TO ASSIST  Length of Stay: Yes-24 DAYS  Discharge activity level: Yes-SUPERVISION WHEELCHAIR LEVEL  Home/community participation: Yes  Services provided included: MD, RD, PT, OT, RN, CM, TR, Pharmacy and [place]: Medicare and Private Insurance: [employer]  Follow-up services arranged: Home Health: [facility], DME: [facility], [place], [facility] and Patient/Family has no preference for HH/DME agencies  Comments (or additional information):  Patient/Family verbalized understanding of follow-up arrangements: Yes  Individual responsible for coordination of the follow-up plan: VALERIE-DAUGHTER  Confirmed correct DME delivered: Elease Hashimoto 03/21/2011    Elease Hashimoto

## 2011-03-21 NOTE — Progress Notes (Signed)
[facility] Progress Note Subjective:  Wants off the HD machine.  I'm cold.  It is always this way. Better later in the room, ready to go home Objective Vital signs in last 24 hours: Filed Vitals:   03/21/11 0630 03/21/11 0700 03/21/11 0730 03/21/11 0800  BP: 128/77 142/96 150/94 161/92  Pulse: 99 105 106 100  Temp:      TempSrc:      Resp: 16 16 16 16   Height:      Weight:      SpO2:       Weight change: 1.8 kg (3 lb 15.5 oz)  Intake/Output Summary (Last 24 hours) at 03/21/11 0817 Last data filed at 03/20/11 1930  Gross per 24 hour  Intake    120 ml  Output    200 ml  Net    -80 ml   Labs: Basic Metabolic Panel:  Lab [id number] 0607 03/19/11 0712 03/18/11 2038  NA 140 140 136  K 5.3* 4.0 3.9  CL 103 101 97  CO2 24 30 29   GLUCOSE 91 84 105*  BUN 47* 18 15  CREATININE 6.35* 3.51* 2.77*  CALCIUM 9.8 9.3 9.0  ALB -- -- --  PHOS 2.0* 1.7* 1.1*   Liver Function Tests:  Lab 03/21/11 0607 03/19/11 0712 03/18/11 2038  AST -- -- --  ALT -- -- --  ALKPHOS -- -- --  BILITOT -- -- --  PROT -- -- --  ALBUMIN 2.4* 2.3* 2.3*  CBC:  Lab 03/21/11 0607 03/18/11 2038 03/17/11 0500 03/16/11 1549 03/14/11 1855  WBC 11.8* 12.5* 12.4* -- --  NEUTROABS -- -- -- -- --  HGB 8.2* 7.7* 7.5* -- --  HCT 26.3* 24.7* 24.4* -- --  MCV 91.6 91.1 88.4 88.0 88.1  PLT 416* 300 334 -- --  CBG:  Lab 03/14/11 2246 03/14/11 1617  GLUCAP 94 118*  Medications:      . ciprofloxacin  500 mg Oral Q breakfast  . darbepoetin      . darbepoetin (ARANESP) injection - DIALYSIS  200 mcg Intravenous Q Thu-HD  . feeding supplement (NEPRO CARB STEADY)  237 mL Oral TID WC  . ferric gluconate (FERRLECIT/NULECIT) IV  125 mg Intravenous Q T,Th,Sa-HD  . megestrol  200 mg Oral BID  . multivitamin  1 tablet Oral Daily  . pantoprazole  40 mg Oral Q1200    I  have reviewed scheduled and prn medications.  Physical Exam: HR 127 BP 153/85 Goal decreased to 3000 2.2 off T 97.1 General:  anxious/irritable Heart:  Tachy, but regular Lungs: CTA Abdomen: soft NT Extremities: no edema Dialysis Access: Qb 400  Problem/Plan: 1. Abdominal abscess (after hemicolectomy surgery for ischemic bowel with ileostomy creation 02/09/11) -body fluid culture  E. Coli, cipro. WBC is decreasing, and imaging shows improvement in abscess, follow up with surgery after d/c. 2. ESRD - TTS via avg 3. Anemia - Fe def; Hgb increasing; max ESA; repleting Fe 4. Secondary hyperparathyroidism - off binders P increasing; follow as outpt. 5. HTN/volume - evaluated EDW after HD today.; BP higher than recently; can follow up at the outpatient HD center and resume BP med if needed  6. Nutrition - renal diet Lumbar radiculopathy:with bilateral lower extremity pain - her biggest complaint; meds d/c'd due to oversedation . Continue to monitor while on rehabilitation. (surgery with lumbar decompression by Dr. Sherwood Gambler was on 01/30/11 = original reason for hospital admission) PT  7. Left arm edema - Duplex findings consistent with acute  deep vein thrombosis involving the left upper extremity. Not felt to be anticoag candidate  8.D/c today - states her family will help her after d/c (would not elaborate further)    Myriam Jacobson, PA-C [place] [phone]  03/21/2011,8:17 AM  LOS: 24 days   Patient seen and examined, agree with above note with above modifications.  Corliss Parish, MD 03/21/2011

## 2011-03-21 NOTE — Consult Note (Signed)
Ostomy Follow-up:  Daughter and patient participated in pouch change.  Pt able to empty and reapply clamp.  Stoma red and viable, above skin level, mod liquid brown stool in pouch.  Pt able to apply pouch using mirror in lap to assist with line of vision, states she does not feel stable enough to stand and apply.  Numerous skin folds when pt sitting, discussed troubleshooting methods with pouch application.  Pt has been placed on [facility] discharge program.  Reviewed pouching steps, ordering supplies, and dietary precautions.  Pt and daughter deny further questions at this time and state they feel ready for d/c home. Will not plan to follow further unless re-consulted.  [id number] [place], MSN, [place]

## 2011-03-21 NOTE — Discharge Summary (Signed)
NAMEEZTLI, DESO              ACCOUNT NO.:  [id number]  MEDICAL RECORD NO.:  [id number]  LOCATION:  4006                         FACILITY:  [facility]  PHYSICIAN:  Meredith Staggers, M.D.DATE OF BIRTH:  [birth year]  DATE OF ADMISSION:  02/25/2011 DATE OF DISCHARGE:  03/21/2011                              DISCHARGE SUMMARY   DISCHARGE DIAGNOSES: 1. Lumbar stenosis with radiculopathy. 2. Deep vein thrombosis prophylaxis with partially occluded left upper     extremity deep vein thrombosis. 3. Pain management. 4. Ischemic colitis. 5. Abdominal abscess-improved. 6. End-stage renal disease with hemodialysis. 7. Chronic anemia. 8. Hypertension.  HISTORY OF PRESENT ILLNESS:  This is a 61 year old right-handed black female with end-stage renal disease, admitted January 30, 2011, with increased low back pain since August 2012, with progressive right foot drop.  X-rays and imaging showed grade 2 dynamic degenerative spondylolisthesis of lumbar 4 and 5.  MRI revealed severe multifactorial spinal stenosis and radiculopathy with marked severe multifactorial stenosis at lumbar L5 and S1.  The patient underwent lumbar L4-S1 lumbar laminectomy, foraminotomies with decompression as well as lumbar 4 and 5 and lumbar L5 and S1 posterior lumbar interbody arthrodesis on January 30, 2011, per Dr. Sherwood Gambler.  She was fitted with a back brace to be worn when out of bed.  She was noted to have end-stage renal disease with hemodialysis ongoing.  She was admitted to [facility]patient [facility] on February 01, 2011, progressing nicely with moderate assist for mobility when she developed abdominal pain, February 08, 2011, increasing tenderness to the epigastric and right lower quadrant.  CT scan of the abdomen on February 08, 2011, demonstrated right transverse colon wall thickening.  The patient had 2-3 bowel movements over a 24 hour period. No blood or diarrhea noted.  General Surgery was consulted per  Dr. Coralie Keens.  She was discharged to [facility], underwent exploratory laparotomy with extended right hemicolectomy and ileostomy for ischemic bowel on February 09, 2011, per Dr. Ninfa Linden.  Maintained on antibiotics for empiric coverage.  She continued hemodialysis as advised.  Noted anemia on February 10, 2011, and transfused 2 units of packed red blood cells.  Diet slowly advanced to regular.  She did have some bright red rectal bleeding x2 with followup surgery felt to be possibly due to heparin during dialysis and monitored.  All antibiotics have since been discontinued.  Latest white blood cell count of 11,000. She was requiring total assist for mobility and was admitted back for comprehensive rehab program.  PAST MEDICAL HISTORY:  See discharge diagnoses.  SOCIAL HISTORY:  She lives alone with family arranging assistance as needed.  One-level home, 2 steps to entry.  FUNCTIONAL HISTORY PRIOR TO ADMISSION:  Was independent with basic activities of daily living and homemaking.  She does drive.  FUNCTIONAL STATUS UPON ADMISSION TO REHAB SERVICES:  Minimal assist to roll in the bed, moderate assist for sit to stand, +2 total assist for stand to sit, ambulating 50 feet total assist with a rolling walker.  ALLERGIES:  CONTRAST MEDIA.  PHYSICAL EXAMINATION:  VITAL SIGNS:  Blood pressure 117/72, pulse 106, temperature 98.2, respirations 16. GENERAL:  This was an alert female in no acute  distress oriented x3. HEAD:  Normocephalic. LUNGS:  Clear to auscultation. CARDIAC:  Regular rate and rhythm. ABDOMEN:  Soft, nontender.  Good bowel sounds. BACK:  Incision well healed.  She did have a small sacral wound with a 3- cm decubitus ulcer. EXTREMITIES:  The patient with 4/5 strength upper extremity.  Lower strength grossly graded 2 to 3/5 proximal, 3/5 at the knees, 3/5 at the ankle plantar flexion, 0 to trace out of 5 ankle dorsiflexion.  REHABILITATION HOSPITAL  COURSE:  The patient was admitted to Inpatient Rehab Services with therapies initiated on a 3 hour daily basis consisting of physical therapy, occupational therapy, and rehabilitation nursing.  The following issues were addressed during the patient's rehabilitation stay.  Pertaining to Ms. Lannom lumbar stenosis, radiculopathy, she had undergone laminectomy lumbar L4-5, L5-S1 on January 30, 2011, per Dr. Sherwood Gambler.  She was wearing a back course and went out of bed, routine back precautions.  Consideration made for AFO braces due to foot drop, however, due to increased swelling this was not able to be attained at this time.  Sequential compression devices were in place for deep vein thrombosis prophylaxis.  Venous Doppler studies lower extremities were negative.  However, on March 15, 2011, due to some left upper extremity swelling, a Doppler was completed showing partial deep vein thrombosis internal jugular subclavian vein. Conservative care was provided and she was not a candidate for a filter at this time.  It was advised to keep the arm elevated at all times. Swelling did continue to improve.  Pain management ongoing with limited use of narcotics and monitoring of mental status.  She was followed by Dr. Coralie Keens for her ischemic colitis.  She had undergone a right hemicolectomy, ileostomy with full education provided.  During her hospital course, she did have some elevated white blood cell count covered with empiric antibiotics.  A CT of the abdomen was completed on February 14, 2011, showing a right posterior abdominal fluid collection, suspect abscess.  General Surgery was again consulted back in.  A CT- guided drain was placed on March 08, 2011, per Interventional Radiology.  Cultures of this did show E. coli and placed on Cipro on March 11, 2011.  Her white blood cell count did continue to improve down to 11,000 as well as fevers resolving.  Followup CT scan  showed resolution of abscess.  She would follow up with Dr. Ninfa Linden after discharge from the hospital.  She remained on hemodialysis as advised per [facility].  Chronic anemia with Aranesp ongoing. She had no current antihypertensive medications and close monitoring of blood pressure for any orthostatic changes.  Her JP drain was removed on March 18, 2011, after a followup CT of the abdomen showed improved area of abscess and monitored.  The patient received weekly collaborative interdisciplinary team conferences to discuss estimated length of stay, family teaching, and any barriers to her discharge, will be receiving full education in regards to her ileostomy with the patient as well as family.  She was overall minimal assist for activities of daily living, moderate assist for activities of daily living transfer, moderate assist to ambulate 15 feet with a rolling walker, supervision to wheelchair mobility.  Her strength and endurance continued to improve throughout her rehab course as she was encouraged the overall progress and home health therapies would be ongoing.  DISCHARGE MEDICATIONS:  At the time of dictation included: 1. Cipro 500 mg daily, initiated March 11, 2011, she would remain  on this until followup with Dr. Ninfa Linden of General Surgery. 2. Aranesp weekly with dialysis as per [facility]. 3. Multivitamin 1 tablet daily. 4. Oxycodone 1-2 tablets every 4 hours as needed for pain, dispensed     60 tablets. 5. Protonix 40 mg daily.  DIET:  A renal diet.  SPECIAL INSTRUCTIONS:  The patient should continue with dialysis as advised per [facility].  The patient was advised to follow up with Dr. Coralie Keens of General Surgery in relation to her ischemic colitis, hemicolectomy.  Continue Cipro at the discretion of Dr. Ninfa Linden.  Should follow up with Dr. Alger Simons at the Outpatient Rehab Service office as  advised, Dr. Sherwood Gambler, Neurosurgery [phone] call for appointment.  Home health therapies had been arranged as per [employer].     Lauraine Rinne, P.A.   ______________________________ Meredith Staggers, M.D.    DA/MEDQ  D:  03/21/2011  T:  03/21/2011  Job:  [id number]  cc:   Hosie Spangle, M.D. Coralie Keens, M.D.

## 2011-03-21 NOTE — Progress Notes (Signed)
Patient ID: CRYSTALINA AVALLONE, female   DOB: Nov 29, 1950, 61 y.o.   MRN: [id number] Patient ID: HANIN DEIDA, female   DOB: [birth year], 61 y.o.   MRN: [id number] Patient ID: JAZLYNE GUTTER, female   DOB: Dec 26, 1950, 61 y.o.   MRN: [id number] Subjective/Complaints: Review of Systems  Musculoskeletal: Positive for back pain.  Excited to go home 1/3  Objective: Vital Signs: Blood pressure 133/81, pulse 104, temperature 98.7 F (37.2 C), temperature source Oral, resp. rate 18, height 5\' 5"  (1.651 m), weight 50.8 kg (111 lb 15.9 oz), SpO2 98.00%.   Physical Exam:  General appearance: alert, cooperative and no distress Head: Normocephalic, without obvious abnormality, atraumatic Eyes: conjunctivae/corneas clear. PERRL, EOM's intact. Fundi benign. Ears: normal TM's and external ear canals both ears Nose: Nares normal. Septum midline. Mucosa normal. No drainage or sinus tenderness. Throat: lips, mucosa, and tongue normal; teeth and gums normal Neck: no adenopathy, no carotid bruit, no JVD, supple, symmetrical, trachea midline and thyroid not enlarged, symmetric, no tenderness/mass/nodules  R   IJ in place Back: symmetric, no curvature. ROM normal. No CVA tenderness. Resp: clear to auscultation bilaterally Cardio: regular rate and rhythm, S1, S2 normal, Gr 3/6 SEM  No  click, rub or gallop; rate 90-100 GI: soft, non-tender; bowel sounds normal; no masses,  no organomegaly;  colostomy noted Extremities: extremities normal, atraumatic, no cyanosis or edema slight edema distally only; AV fistula R arm.  LUE with tr to tr edema and minimal pain in proximal arm.  No definite masses. LUE is warm with palpable pulses at wrist. Minimal LE edema. Pulses: 2+ and symmetric Skin: Skin color, texture, turgor normal. No rashes or lesions Neurologic: continued foot drop tr/5 but pt denies sensory changes. Plantarflexion 3-4/5.  Generalized weakness in proximal legs 3/5.  Heel cords loose.  Remains  alert Incision/Wound: wounds all clean and intact.  Ostomy site sealed.   Exam 1/3   CBG (last 3)   Basename 03/09/11 1809  GLUCAP 88    Assessment/Plan: 1. Functional deficits secondary to lumbar stenosis with radiculopathy s/p decompression and ischemic colitis requiring colectomy which require 3+ hours per day of interdisciplinary therapy in a comprehensive inpatient rehab setting. Physiatrist is providing close team supervision and 24 hour management of active medical problems listed below. Physiatrist and rehab team continue to assess barriers to discharge/monitor patient progress toward functional and medical goals.  D/C today.  F/u with me in next month. Will need to see, NS, general surgery, and RENAL.   Mobility: Bed Mobility Bed Mobility: Yes Rolling Right: 4: Min assist;With rail Rolling Right Details (indicate cue type and reason): Cues to bend bil LEs and roll completely before pushign up to sit Left Sidelying to Sit: 3: Mod assist Left Sidelying to Sit Details (indicate cue type and reason): Increased effort and pain with coming up to sit'  Transfers Transfers: Yes Sit to Stand: 2: Max assist;With upper extremity assist;From chair/3-in-1 Stand to Sit: 2: Max assist;With upper extremity assist;To chair/3-in-1 Squat Pivot Transfers: 3: Mod assist Ambulation/Gait Ambulation/Gait Assistance: 4: Min assist Ambulation/Gait Assistance Details (indicate cue type and reason): A for LE lifting and placing due to bil LE foot drop, A for seaight shifting to unweight opposite LE, blocking knees Ambulation Distance (Feet): 8 Feet Assistive device: Rolling walker Gait Pattern: Decreased dorsiflexion - right;Decreased dorsiflexion - left;Right foot flat;Left foot flat;Right genu recurvatum;Left genu recurvatum;Trunk flexed;Right hip hike Stairs: No (Unsafe t this time) Product manager Mobility: Yes Wheelchair Assistance: 4: Min assist  Wheelchair Propulsion: Both  upper extremities Wheelchair Parts Management: Supervision/cueing (Cues for maintaining back precautions with parts management) Distance: 150 ADL:   Cognition: Cognition Overall Cognitive Status: Appears within functional limits for tasks assessed Arousal/Alertness: Lethargic Orientation Level: Oriented to person;Oriented to place;Oriented to time Memory: Impaired (forgot date after 1 minute) Awareness: Appears intact Problem Solving: Appears intact Safety/Judgment: Appears intact Cognition Arousal/Alertness: Lethargic Orientation Level: Oriented to person;Oriented to place;Oriented to time  1.Lumbar stenosis with radiculopathy-S/P laminectomy Lumbar 4-5 and L 5 S1 11/14.Back corset when out of bed.Routine back precautions. Consider b/l AFO's for gait, but with pedal edema presently Edema control.  AFO's as an outpt.  2. DVT Prophylaxis/Anticoagulation: SCD,s/ and support hose. Elevate LUE--conservative care.  3. Pain Management: percocet.prn--- scheduled neurontin and percocet stopped due to ams.  Minimize neurosedating meds due to her poor tolerance.  4.Ischemic Colitis-S/P exp lap with right hemicolectomy and ileostomy 11/24.Provide ileostomy care and education. She is doing some ostomy care already on her own.   -would continue cipro until surgery follow upo.   -JP drain removed  -abdominal CT looks better.   5. ESRD: dialysis as directed per renal services.Weigh patient daily.Follow up labs with dialysis.    6Chronic anemia-aranesp with HD.Follow up CBC with HD and transfuse if needed.   7.HTN-No present meds and monitor with increased activity.          LOS (Days) [place] T

## 2011-03-21 NOTE — Progress Notes (Signed)
Patient left via wheelchair transport in satisfactory condition. Dan,PA reviewed discharge instructions, follow up and prescriptions with patient and her daughters. Patient given a copy of discharge instructions.

## 2011-03-22 DIAGNOSIS — M216X9 Other acquired deformities of unspecified foot: Secondary | ICD-10-CM | POA: Insufficient documentation

## 2011-04-03 ENCOUNTER — Emergency Department (HOSPITAL_COMMUNITY): Payer: Medicare Other

## 2011-04-03 ENCOUNTER — Other Ambulatory Visit: Payer: Self-pay

## 2011-04-03 ENCOUNTER — Encounter (HOSPITAL_COMMUNITY): Payer: Self-pay

## 2011-04-03 ENCOUNTER — Inpatient Hospital Stay (HOSPITAL_COMMUNITY)
Admission: EM | Admit: 2011-04-03 | Discharge: 2011-04-10 | DRG: 252 | Disposition: A | Discharge status: Home / Self Care | Payer: Medicare Other | Attending: Internal Medicine | Admitting: Internal Medicine

## 2011-04-03 DIAGNOSIS — Z87891 Personal history of nicotine dependence: Secondary | ICD-10-CM

## 2011-04-03 DIAGNOSIS — B1920 Unspecified viral hepatitis C without hepatic coma: Secondary | ICD-10-CM | POA: Diagnosis present

## 2011-04-03 DIAGNOSIS — Z91041 Radiographic dye allergy status: Secondary | ICD-10-CM

## 2011-04-03 DIAGNOSIS — I498 Other specified cardiac arrhythmias: Secondary | ICD-10-CM | POA: Diagnosis not present

## 2011-04-03 DIAGNOSIS — R4182 Altered mental status, unspecified: Secondary | ICD-10-CM | POA: Diagnosis present

## 2011-04-03 DIAGNOSIS — T82898A Other specified complication of vascular prosthetic devices, implants and grafts, initial encounter: Principal | ICD-10-CM | POA: Diagnosis present

## 2011-04-03 DIAGNOSIS — K55039 Acute (reversible) ischemia of large intestine, extent unspecified: Secondary | ICD-10-CM | POA: Diagnosis present

## 2011-04-03 DIAGNOSIS — Z905 Acquired absence of kidney: Secondary | ICD-10-CM

## 2011-04-03 DIAGNOSIS — Z992 Dependence on renal dialysis: Secondary | ICD-10-CM

## 2011-04-03 DIAGNOSIS — Y92009 Unspecified place in unspecified non-institutional (private) residence as the place of occurrence of the external cause: Secondary | ICD-10-CM

## 2011-04-03 DIAGNOSIS — K559 Vascular disorder of intestine, unspecified: Secondary | ICD-10-CM | POA: Diagnosis present

## 2011-04-03 DIAGNOSIS — Y841 Kidney dialysis as the cause of abnormal reaction of the patient, or of later complication, without mention of misadventure at the time of the procedure: Secondary | ICD-10-CM | POA: Diagnosis not present

## 2011-04-03 DIAGNOSIS — N2581 Secondary hyperparathyroidism of renal origin: Secondary | ICD-10-CM | POA: Diagnosis present

## 2011-04-03 DIAGNOSIS — Z7682 Awaiting organ transplant status: Secondary | ICD-10-CM

## 2011-04-03 DIAGNOSIS — Z932 Ileostomy status: Secondary | ICD-10-CM

## 2011-04-03 DIAGNOSIS — N189 Chronic kidney disease, unspecified: Secondary | ICD-10-CM | POA: Diagnosis present

## 2011-04-03 DIAGNOSIS — Y832 Surgical operation with anastomosis, bypass or graft as the cause of abnormal reaction of the patient, or of later complication, without mention of misadventure at the time of the procedure: Secondary | ICD-10-CM | POA: Diagnosis present

## 2011-04-03 DIAGNOSIS — Z833 Family history of diabetes mellitus: Secondary | ICD-10-CM

## 2011-04-03 DIAGNOSIS — R000 Tachycardia, unspecified: Secondary | ICD-10-CM

## 2011-04-03 DIAGNOSIS — I120 Hypertensive chronic kidney disease with stage 5 chronic kidney disease or end stage renal disease: Secondary | ICD-10-CM | POA: Diagnosis present

## 2011-04-03 DIAGNOSIS — N186 End stage renal disease: Secondary | ICD-10-CM | POA: Diagnosis present

## 2011-04-03 DIAGNOSIS — D631 Anemia in chronic kidney disease: Secondary | ICD-10-CM | POA: Diagnosis present

## 2011-04-03 DIAGNOSIS — I728 Aneurysm of other specified arteries: Secondary | ICD-10-CM | POA: Diagnosis present

## 2011-04-03 DIAGNOSIS — E119 Type 2 diabetes mellitus without complications: Secondary | ICD-10-CM | POA: Diagnosis present

## 2011-04-03 DIAGNOSIS — I953 Hypotension of hemodialysis: Secondary | ICD-10-CM | POA: Diagnosis not present

## 2011-04-03 DIAGNOSIS — G609 Hereditary and idiopathic neuropathy, unspecified: Secondary | ICD-10-CM | POA: Diagnosis present

## 2011-04-03 HISTORY — DX: Spinal stenosis, lumbar region without neurogenic claudication: M48.061

## 2011-04-03 HISTORY — DX: Vascular disorder of intestine, unspecified: K55.9

## 2011-04-03 LAB — CBC
MCH: 28 pg (ref 26.0–34.0)
MCV: 92 fL (ref 78.0–100.0)
Platelets: 323 10*3/uL (ref 150–400)
RDW: 18.8 % — ABNORMAL HIGH (ref 11.5–15.5)

## 2011-04-03 LAB — COMPREHENSIVE METABOLIC PANEL
ALT: 15 U/L (ref 0–35)
Calcium: 10.9 mg/dL — ABNORMAL HIGH (ref 8.4–10.5)
GFR calc Af Amer: 9 mL/min — ABNORMAL LOW (ref >90)
Glucose, Bld: 83 mg/dL (ref 70–99)
Sodium: 135 mEq/L (ref 135–145)
Total Protein: 8.4 g/dL — ABNORMAL HIGH (ref 6.0–8.3)

## 2011-04-03 LAB — DIFFERENTIAL
Basophils Absolute: 0.1 10*3/uL (ref 0.0–0.1)
Eosinophils Absolute: 0.4 10*3/uL (ref 0.0–0.7)
Eosinophils Relative: 5 % (ref 0–5)
Lymphs Abs: 2.8 10*3/uL (ref 0.7–4.0)

## 2011-04-03 MED ORDER — HEPARIN SODIUM (PORCINE) 5000 UNIT/ML IJ SOLN
5000.0000 [IU] | Freq: Three times a day (TID) | INTRAMUSCULAR | Status: DC
  Administered 2011-04-04 – 2011-04-09 (×15): 5000 [IU] via SUBCUTANEOUS
  Filled 2011-04-03 (×23): qty 1

## 2011-04-03 MED ORDER — CIPROFLOXACIN HCL 500 MG PO TABS: 500.0000 mg | ORAL_TABLET | Freq: Every day | ORAL | Status: DC

## 2011-04-03 MED ORDER — CIPROFLOXACIN HCL 500 MG PO TABS
500.0000 mg | ORAL_TABLET | Freq: Every day | ORAL | Status: DC
  Administered 2011-04-04 – 2011-04-09 (×6): 500 mg via ORAL
  Filled 2011-04-03 (×8): qty 1

## 2011-04-03 MED ORDER — SODIUM CHLORIDE 0.9 % IV BOLUS (SEPSIS)
500.0000 mL | Freq: Once | INTRAVENOUS | Status: AC
  Administered 2011-04-03: 500 mL via INTRAVENOUS

## 2011-04-03 MED ORDER — ACETAMINOPHEN 325 MG PO TABS
650.0000 mg | ORAL_TABLET | Freq: Four times a day (QID) | ORAL | Status: DC | PRN
  Administered 2011-04-06 – 2011-04-09 (×5): 650 mg via ORAL
  Filled 2011-04-03 (×5): qty 2

## 2011-04-03 MED ORDER — SODIUM CHLORIDE 0.9 % IV SOLN: 250.0000 mL | INTRAVENOUS | Status: DC | PRN

## 2011-04-03 MED ORDER — PANTOPRAZOLE SODIUM 40 MG PO TBEC
40.0000 mg | DELAYED_RELEASE_TABLET | Freq: Every day | ORAL | Status: DC
Start: 2011-04-03 — End: 2011-04-10
  Administered 2011-04-04 – 2011-04-09 (×7): 40 mg via ORAL
  Filled 2011-04-03 (×7): qty 1

## 2011-04-03 MED ORDER — DARBEPOETIN ALFA-POLYSORBATE 200 MCG/0.4ML IJ SOLN
200.0000 ug | INTRAMUSCULAR | Status: DC
  Filled 2011-04-03: qty 0.4

## 2011-04-03 MED ORDER — ACETAMINOPHEN 650 MG RE SUPP: 650.0000 mg | Freq: Four times a day (QID) | RECTAL | Status: DC | PRN

## 2011-04-03 MED ORDER — SODIUM CHLORIDE 0.9 % IJ SOLN: 3.0000 mL | INTRAMUSCULAR | Status: DC | PRN

## 2011-04-03 MED ORDER — RENA-VITE PO TABS
1.0000 | ORAL_TABLET | Freq: Every day | ORAL | Status: DC
  Administered 2011-04-04: 1 via ORAL
  Filled 2011-04-03 (×2): qty 1

## 2011-04-03 MED ORDER — SODIUM CHLORIDE 0.9 % IJ SOLN
3.0000 mL | Freq: Two times a day (BID) | INTRAMUSCULAR | Status: DC
  Administered 2011-04-04 – 2011-04-09 (×11): 3 mL via INTRAVENOUS

## 2011-04-03 MED ORDER — CIPROFLOXACIN HCL 500 MG PO TABS
500.0000 mg | ORAL_TABLET | Freq: Two times a day (BID) | ORAL | Status: DC
  Administered 2011-04-04: 500 mg via ORAL
  Filled 2011-04-03: qty 1

## 2011-04-03 NOTE — ED Provider Notes (Signed)
I saw and evaluated the patient, reviewed the resident's note and I agree with the findings and plan.  Patient's labs and x-rays noted. Patient's medical status according to her family has improved. Noted to be persistently tachycardic. Given IV bolus of saline remains tachycardic. Will be admitted for evaluation of increased heart rate as well as her altered mental status  Jenna Jacobsen, MD 04/03/11 1857

## 2011-04-03 NOTE — ED Notes (Signed)
Pt denied being a diabetic; reports dialysis due to hx of hypertension. Vickie, RN on floor made aware.

## 2011-04-03 NOTE — ED Notes (Signed)
Pt presents with increased fatigue x 2-3 days that has worsened today.  Daughter reports noting confusion today with report from home health nurse with same.  Daughter reports pt was admitted here with back surgery, discharged 1/3.  Therapist reported to daughter that pt's heart rate was high today and BP was low.  Pt had HD yesterday, received all but 30 minutes treatment yesterday.

## 2011-04-03 NOTE — ED Provider Notes (Signed)
History     CSN: [id number]  Arrival date & time 04/03/11  1603   First MD Initiated Contact with Patient 04/03/11 1659      Chief Complaint  Patient presents with  . Altered Mental Status    (Consider location/radiation/quality/duration/timing/severity/associated sxs/prior treatment) Patient is a 61 y.o. female presenting with altered mental status. The history is provided by the patient. No language interpreter was used.  Altered Mental Status This is a new problem. The current episode started today. The problem occurs constantly. The problem has been unchanged. Pertinent negatives include no abdominal pain, chest pain, chills, coughing, fever, headaches, nausea, numbness, vertigo, vomiting or weakness. The symptoms are aggravated by nothing. She has tried nothing for the symptoms. The treatment provided no relief.    Past Medical History  Diagnosis Date  . Seizures     r/t HTN in 1990's x 1  . Brain aneurysm   . Blood transfusion 1990's    r/t Kidney removal surgery  . Arthritis     Back  . Anemia   . Umbilical hernia age 80  . Heart murmur     Born with heart mumur, does not require follow up per pt  . Hypertension     Does not see a heart doctor, had pre transplant stress test at [facility]    . Diabetes mellitus     Borderline  . End stage renal disease on dialysis     T/Th/Sat dialysis on [employer]  . End stage renal disease on dialysis     01-29-2011  . Hepatitis C     Past Surgical History  Procedure Date  . Nephrectomy 2010    right side done at [facility] on transplant list  . Umbilical hernia repair age 70- 32  . Appendectomy 1960's  . Vascular surgery     right arm dialysis graft  . Tonsillectomy     as a child  . Rib resection     d/t kidney removal  . Back surgery 01/30/2011    laminectomy  . Hernia repair   . Laparotomy 02/09/2011    Procedure: EXPLORATORY LAPAROTOMY;  Surgeon: Harl Bowie, MD;  Location: [place];  Service: General;   Laterality: N/A;  . Partial colectomy 02/09/2011    Procedure: PARTIAL COLECTOMY;  Surgeon: Harl Bowie, MD;  Location: [place];  Service: General;  Laterality: Right;  . Colostomy 02/09/2011    Procedure: COLOSTOMY;  Surgeon: Harl Bowie, MD;  Location: [place];  Service: General;  Laterality: Right;  . Colonoscopy 02/19/2011    Procedure: COLONOSCOPY;  Surgeon: Missy Sabins, MD;  Location: [place];  Service: Endoscopy;  Laterality: N/A;  . Colonoscopy 03/07/2011    Procedure: COLONOSCOPY;  Surgeon: Cleotis Nipper, MD;  Location: [facility] ENDOSCOPY;  Service: Endoscopy;  Laterality: N/A;  no prep needed--pt has long Hartmann's pouch and ileostomy    Family History  Problem Relation Age of Onset  . Anesthesia problems Neg Hx   . Hypotension Neg Hx   . Malignant hyperthermia Neg Hx   . Pseudochol deficiency Neg Hx     History  Substance Use Topics  . Smoking status: Former Smoker -- 0.5 packs/day for 48 years    Types: Cigarettes    Quit date: 08/17/2010  . Smokeless tobacco: Never Used  . Alcohol Use: 1.2 oz/week    2 Shots of liquor per week    OB History    Grav Para Term Preterm Abortions TAB SAB Ect  Mult Living                  Review of Systems  Constitutional: Negative for fever and chills.  Respiratory: Negative for cough.   Cardiovascular: Negative for chest pain.  Gastrointestinal: Negative for nausea, vomiting and abdominal pain.  Neurological: Negative for vertigo, weakness, numbness and headaches.  Psychiatric/Behavioral: Positive for altered mental status.  All other systems reviewed and are negative.    Allergies  Contrast media and Iohexol  Home Medications   Current Outpatient Rx  Name Route Sig Dispense Refill  . CIPROFLOXACIN HCL 500 MG PO TABS Oral Take 500 mg by mouth 2 (two) times daily.    Marland Kitchen RENA-VITE PO TABS Oral Take 1 tablet by mouth daily. 30 tablet 1  . PANTOPRAZOLE SODIUM 40 MG PO TBEC Oral Take 1 tablet (40 mg total) by  mouth daily at 12 noon. 30 tablet 1    BP 117/84  Pulse 121  Temp(Src) 98 F (36.7 C) (Oral)  Resp 20  SpO2 96%  Physical Exam  Constitutional: She appears well-developed and well-nourished.  HENT:  Head: Normocephalic and atraumatic.  Eyes: Pupils are equal, round, and reactive to light.  Neck: Normal range of motion. Neck supple. No tracheal deviation present. No thyromegaly present.  Cardiovascular: Regular rhythm.  Tachycardia present.  Exam reveals no decreased pulses.   Pulmonary/Chest: Effort normal and breath sounds normal. She has no wheezes. She has no rales. She exhibits no tenderness.  Abdominal: Soft. She exhibits no distension. There is no tenderness. There is no rebound.       A well-appearing ileostomy.  Musculoskeletal: Normal range of motion. She exhibits edema (Mild edema of left foot.).       Arms: Neurological: She is alert. She displays normal reflexes. No cranial nerve deficit. She exhibits normal muscle tone. Coordination normal.  Skin: Skin is warm and dry.    ED Course  Procedures (including critical care time)  Labs Reviewed  CBC - Abnormal; Notable for the following:    RBC 3.86 (*)    Hemoglobin 10.8 (*)    HCT 35.5 (*)    RDW 18.8 (*)    All other components within normal limits  DIFFERENTIAL - Abnormal; Notable for the following:    Monocytes Relative 16 (*)    Monocytes Absolute 1.4 (*)    All other components within normal limits  COMPREHENSIVE METABOLIC PANEL - Abnormal; Notable for the following:    Chloride 91 (*)    BUN 38 (*)    Creatinine, Ser 5.62 (*)    Calcium 10.9 (*)    Total Protein 8.4 (*)    Albumin 3.1 (*)    Alkaline Phosphatase 119 (*)    GFR calc non Af Amer 7 (*)    GFR calc Af Amer 9 (*)    All other components within normal limits  PRO B NATRIURETIC PEPTIDE - Abnormal; Notable for the following:    Pro B Natriuretic peptide (BNP) 2300.0 (*)    All other components within normal limits  TROPONIN I  PROTIME-INR   LACTIC ACID, PLASMA  AMMONIA  CLOSTRIDIUM DIFFICILE BY PCR  HEMOGLOBIN A1C  MRSA PCR SCREENING  CLOSTRIDIUM DIFFICILE BY PCR   Dg Chest 2 View  04/03/2011  *RADIOLOGY REPORT*  Clinical Data: Altered level of consciousness.  CHEST - 2 VIEW  Comparison: 02/15/2011  Findings: Cardiac enlargement.  COPD.  Negative for heart failure. Mild bibasilar scarring.  Negative for pneumonia or effusion.  IMPRESSION: COPD with bibasilar scarring.  No acute cardiopulmonary disease.  Original Report Authenticated By: Truett Perna, M.D.   Ct Head Wo Contrast  04/03/2011  *RADIOLOGY REPORT*  Clinical Data: Confusion, fatigue, hypotension  CT HEAD WITHOUT CONTRAST  Technique:  Contiguous axial images were obtained from the base of the skull through the vertex without contrast.  Comparison: None.  Findings: Brain atrophy evident with periventricular chronic microvascular ischemic changes diffusely.  Slight ventricular enlargement.  No acute intracranial hemorrhage, definite infarction, mass lesion, midline shift, herniation, or extra-axial fluid collection.  Cisterns patent.  Cerebellar atrophy as well. Symmetric orbits.  Mastoids and sinuses clear.  IMPRESSION: Atrophy and microvascular chronic ischemic changes.  No acute intracranial finding.  Original Report Authenticated By: Jerilynn Mages. Daryll Brod, M.D.     No diagnosis found. Sinus tachycardia   Date: 04/03/2011  Rate: 121  Rhythm: sinus tachycardia  QRS Axis: normal  Intervals: normal  ST/T Wave abnormalities: normal  Conduction Disutrbances:none  Narrative Interpretation: Q waves in V2 and V3, only in V2 previously  Old EKG Reviewed: No major changes     MDM  61 year old female presents with confusion and fatigue. Fatigue for 2-3 days. Confusion noted this morning started roughly 6 hours ago by her daughter. She's had recent back surgery approximately 2 weeks ago. Home health nurse for rehabilitation noted elevated heart rate lower blood pressure with  systolic in the high 0000000 today. She had dialysis yesterday, however she did not do the last 30 minutes secondary to foot pain. She has multiple chronic medical problems including diabetes, hypertension, brain aneurysm, end stage renal disease, hepatitis C. On arrival, patient's vitals showed tachycardia which appears sinus on monitor. Pressures are stable. Patient is alert, however confused about the date. Her husband is in room and states this is changed from her baseline. She was normal yesterday. He also states she is having difficulty walking. Patient's lungs are clear, no rales or rhonchi. No marked heart murmur appreciated on exam. Abdomen soft and nontender. She has a well-appearing ileostomy in place the right lower quadrant. She has a well-healing scar in her central lower lumbar spine. No fluctuance or induration or erythema is noted. With patient's chronic medical problems, multiple etiologies exist. We'll check basic labs, ammonia, troponin, EKG, head CT, chest x-ray. She does not make urine so we are unable to acquire some for possible analysis. No clear etiology for confusion or tachycardia identified. Patient admitted to internal medicine teaching service for confusion.    Evelina Bucy, MD 04/03/11 [phone]

## 2011-04-03 NOTE — H&P (Signed)
Date: 04/03/2011                  Patient Name:  [name]  MRN: [id number]  DOB: 1950-10-13  Age / Sex: 61 y.o., female   PCP: No primary provider on file.                 Medical Service: Internal Medicine Teaching Service                 Attending Physician: Dr. Larey Dresser     First Contact: Dr. Victorio Palm  Pager: [phone]  Second Contact: Dr. Silverio Decamp  Pager: [phone]              After Hours (After 5pm / weekends / holidays): First Contact  Pager: [phone]   Second Contact  Pager: [phone]     Chief Complaint: confusion  History of Present Illness: Patient is a 61 y.o. female with a PMHx of ESRD on HD (Tues, Thurs, Sat), recent very prolonged and complicated hospital and inpatient rehab course for lumbar spine surgery, then complicated by ischemic colitis for which partial colectomy and ileostomy were performed, who presents to [facility] accompanied by her husband for evaluation of confusion. Husband indicates that this morning, his daughter noted the patient to be more confused than baseline - she seemed disoriented in the room. She is having a hard time with the timeline of things and with what day of the week or year it is. She does know the current president. She thinks that she has not eaten in the last day or two. She denies any nausea or vomiting but thinks she hasn't been hungry. Her ileostomy has been putting out less than usual the past day or so and the consistency has been about the same. She denies any cough, congestion, SOB, chest pain, abdominal pain, leg pain, leg swelling. She has dialysis on Tuesday morning which lasted about 30 minutes shorter than usual due to her feeling cold which she says she often does. Her husband states that she uses a pint of liquor every Friday night and that she does not smoke and does not use any other drugs. One of her daughters handles her medications and she was not available for questions but the list of her medications was verified  but frequency could not be verified. She was unclear which medicines she was taking. She states that she feels fine now. She works with PT three times per week in her home and she is unable to walk because she has a drop foot after back surgery.   Current Outpatient Medications: Medication Sig  . ciprofloxacin (CIPRO) 500 MG tablet Take 500 mg by mouth 2 (two) times daily.  . darbepoetin (ARANESP) 200 MCG/0.4ML SOLN Inject 200 mcg into the skin. Administered at dialysis per nephrology  . gabapentin (NEURONTIN) 300 MG capsule Take 300 mg by mouth daily. Started Friday PTA  . multivitamin (RENA-VIT) TABS tablet Take 1 tablet by mouth daily.  Marland Kitchen oxyCODONE-acetaminophen (PERCOCET) 5-325 MG per tablet Take 1 tablet by mouth every 4 (four) hours as needed.  . pantoprazole (PROTONIX) 40 MG tablet Take 1 tablet (40 mg total) by mouth daily at 12 noon.     Allergies: Allergies  Allergen Reactions  . Contrast Media (Iodinated Diagnostic Agents)     Seziure  . Iohexol Other (See Comments)    Reaction is convulsions     Past Medical History: Past Medical History  Diagnosis Date  . Seizures  r/t HTN in 1990's x 1  . Brain aneurysm     No records could be found  . Blood transfusion 1990's    r/t Kidney removal surgery  . Arthritis     Back  . Anemia     anemia of chronic disease likely 2/2 ESRD per last anemia panel (02/2011) with Fe 35, TIBC 167, ferritin 2041  // BL Hgb 8-10  . Umbilical hernia age 39  . Heart murmur     Born with heart mumur, does not require follow up per pt  . Hypertension     Does not see a heart doctor, had pre transplant stress test at [facility]    . Diabetes mellitus     Borderline  . End stage renal disease on dialysis     Secondary to hypertension // T/Th/Sat dialysis on [employer]  . Hepatitis C   . Colitis, ischemic 01/2011    S/P partial colectomy of right hemicolon and ileostomy placement  . Lumbar spinal stenosis      bilateral L4-5 and L5-S1  posterior lumbar interbody arthrodesis and bilateral L4-S1 posterior lateral arthrodesis for lumbar stenosis dynamic lumbar spondylolisthesis lumbar spondylosis lumbar radiculopathy with foot drop // s/p surgery 01/2011 - see surgery section for details.    Past Surgical History: Procedure Date  . Nephrectomy 2010    right side done at [facility] on transplant list  . Umbilical hernia repair age 62- 36  . Appendectomy 1960's  . Vascular surgery     right arm dialysis graft  . Tonsillectomy     as a child  . Rib resection     d/t kidney removal  . Back surgery 01/30/2011    L4-S1 lumbar laminectomy facetectomy and foraminotomies for decompression a bilateral L4-5 and L5-S1 posterior lumbar interbody arthrodesis and bilateral L4-S1 posterior lateral arthrodesis for lumbar stenosis dynamic lumbar spondylolisthesis lumbar spondylosis lumbar radiculopathy with foot drop  . Hernia repair   . Laparotomy 02/09/2011    Procedure: EXPLORATORY LAPAROTOMY;  Surgeon: Harl Bowie, MD;  Location: [place];  Service: General;  Laterality: N/A;  . Partial colectomy 02/09/2011    Reason for surgery: ischemic colitis of right hemicolon; Surgeon: Harl Bowie, MD;  Location: [place];  Service: General;  Laterality: Right;  . Colostomy 02/09/2011    Reason for surgery: ischemic colitis of right hemicolon requiring partial colectomy and ileostomy.  Surgeon: Harl Bowie, MD;  Location: [place];  Service: General;  Laterality: Right;  . Colonoscopy 02/19/2011    Procedure: COLONOSCOPY;  Surgeon: Missy Sabins, MD;  Location: [place];  Service: Endoscopy;  Laterality: N/A;  . Colonoscopy 03/07/2011    Procedure: COLONOSCOPY;  Surgeon: Cleotis Nipper, MD;  Location: [facility] ENDOSCOPY;  Service: Endoscopy;  Laterality: N/A;  no prep needed--pt has long Hartmann's pouch and ileostomy  . Total parathyroidectomy 01/17/2001    with autotransplantation into left forearm // due to secondary hyperparathyroidism  from ESRD    Family History: Problem Relation Age of Onset  . Anesthesia problems Neg Hx   . Hypotension Neg Hx   . Malignant hyperthermia Neg Hx   . Pseudochol deficiency Neg Hx   . Diabetes Father     Social History: History   Social History  . Marital Status: Legally Separated    Spouse Name: N/A    Number of Children: 2  . Years of Education: 10th grade   Occupational History  . Retired     previously worked as  a custodian   Social History Main Topics  . Smoking status: Former Smoker -- 0.5 packs/day for 48 years    Types: Cigarettes    Quit date: 08/17/2010  . Smokeless tobacco: Never Used  . Alcohol Use: 1.2 oz/week    2 Shots of liquor per week     1 pint on Fridays  . Drug Use: No  . Sexually Active: No   Other Topics Concern  . Not on file   Social History Narrative  . No narrative on file    Review of Systems: Constitutional:  denies fever, chills, diaphoresis, ADMITS TO appetite change and fatigue.  HEENT: denies photophobia, eye pain, redness, hearing loss, ear pain, congestion, sore throat, rhinorrhea, sneezing, neck pain, neck stiffness and tinnitus.  Respiratory: denies SOB, DOE, cough, chest tightness, and wheezing.  Cardiovascular: denies chest pain, palpitations and leg swelling.  Gastrointestinal: denies nausea, vomiting, abdominal pain, diarrhea, constipation, blood in stool.  Genitourinary: denies dysuria, urgency, frequency, hematuria, flank pain and difficulty urinating.  Musculoskeletal: denies  myalgias, back pain, joint swelling, arthralgias and gait problem. HAS PAIN IN HER LOWER EXTREMITIES  Skin: denies pallor, rash and wound.   Neurological: denies dizziness, seizures, syncope, light-headedness, numbness and headaches. ADMITS TO weakness  Hematological: denies adenopathy, easy bruising, personal or family bleeding history.  Psychiatric/ Behavioral: denies suicidal ideation, mood changes, confusion, nervousness, sleep disturbance and  agitation.    Vital Signs: Blood pressure 144/74, pulse 116, temperature 99.4 F (37.4 C), temperature source Oral, resp. rate 18, SpO2 94.00%.   Physical Exam: General: Vital signs reviewed and noted. Well-developed, well-nourished, in no acute distress; alert, appropriate and cooperative throughout examination.  Head: Normocephalic, atraumatic.  Eyes: PERRL, EOMI, No signs of anemia or jaundince.  Nose: Mucous membranes moist, not inflammed, nonerythematous.  Throat: Oropharynx nonerythematous, no exudate appreciated.   Neck: No deformities, masses, or tenderness noted.Supple, No carotid Bruits, no JVD.  Lungs:  Normal respiratory effort. Clear to auscultation BL without crackles or wheezes.  Heart: RRR. Loud systolic murmur auscultated loudest at the mitral area.  Abdomen:  BS normoactive. Soft, Nondistended, non-tender.  No masses or organomegaly.  Extremities: No pretibial edema. Renal access on R upper arm flow auscultated.  Neurologic: A&O X3, CN II - XII are grossly intact. Motor strength is 5/5 in the all 4 extremities, Sensations intact to light touch, Cerebellar signs negative.  Skin: No visible rashes, scars.   Lab results: Basic Metabolic Panel: Recent Labs  [facility] 04/03/11 1716   NA 135   K 4.7   CL 91*   CO2 31   GLUCOSE 83   BUN 38*   CREATININE 5.62*   CALCIUM 10.9*   MG --   PHOS --   Liver Function Tests: Recent Labs  [facility] 04/03/11 1716   AST 31   ALT 15   ALKPHOS 119*   BILITOT 0.3   PROT 8.4*   ALBUMIN 3.1*   CBC:  Ref. Range 04/03/2011 17:16  WBC Latest Range: 4.0-10.5 K/uL 8.8  RBC Latest Range: 3.87-5.11 MIL/uL 3.86 (L)  HGB Latest Range: 12.0-15.0 g/dL 10.8 (L)  HCT Latest Range: 36.0-46.0 % 35.5 (L)  MCV Latest Range: 78.0-100.0 fL 92.0  MCH Latest Range: 26.0-34.0 pg 28.0  MCHC Latest Range: 30.0-36.0 g/dL 30.4  RDW Latest Range: 11.5-15.5 % 18.8 (H)  Platelets Latest Range: 150-400 K/uL 323  Neutrophils Relative Latest Range:  43-77 % 46  Lymphocytes Relative Latest Range: 12-46 % 32  Monocytes Relative Latest Range: 3-12 %  16 (H)  Eosinophils Relative Latest Range: 0-5 % 5  Basophils Relative Latest Range: 0-1 % 1  Neutrophils Absolute Latest Range: 1.7-7.7 K/uL 4.1  Lymphocytes Absolute Latest Range: 0.7-4.0 K/uL 2.8  Monocytes Absolute Latest Range: 0.1-1.0 K/uL 1.4 (H)  Eosinophils Absolute Latest Range: 0.0-0.7 K/uL 0.4  Basophils Absolute Latest Range: 0.0-0.1 K/uL 0.1    Cardiac Enzymes: Recent Labs  Basename 04/03/11 1721   CKTOTAL --   CKMB --   CKMBINDEX --   TROPONINI <0.30   Coagulation studies:  Ref. Range 04/03/2011 17:16  Prothrombin Time Latest Range: 11.6-15.2 seconds 13.9  INR Latest Range: 0.00-1.49  1.05   Lactic acid:  Ref. Range 04/03/2011 17:21  Lactic Acid, Venous Latest Range: 0.5-2.2 mmol/L 1.1   Pro-BNP:  Ref. Range 04/03/2011 17:20  Pro B Natriuretic peptide (BNP) Latest Range: 0-125 pg/mL 2300.0 (H)   Cardiac enzymes:  Ref. Range 04/03/2011 17:21  Troponin I Latest Range: <0.30 ng/mL <0.30    HISTORICAL LABS: Last Anemia Panel: Lab Results  Component Value Date   IRON 35* 03/16/2011   TIBC 167* 03/16/2011   FERRITIN 2041* 03/11/2011    Imaging results:   Dg Chest 2 View (04/03/2011) - COPD with bibasilar scarring.  No acute cardiopulmonary disease.  Original Report Authenticated By: Truett Perna, M.D.   Ct Head Wo Contrast (04/03/2011) - Atrophy and microvascular chronic ischemic changes.  No acute intracranial finding.  Original Report Authenticated By: Jerilynn Mages. Daryll Brod, M.D.   Other results:  EKG (04/03/2011) - Sinus Tachycardia , regular rate of approximately 121 bpm, normal axis, ST segments: q waves in V2, V3.     Assessment & Plan:  Pt is a 61 y.o. yo female with a PMHx of ESRD on HD (Tues, Thurs, Sat), recent prolonged hospital course involving lumbar spine surgery and partial colectomy for ischemic colitis who was admitted on 04/03/2011 with  symptoms of confusion. At this time, the etiology of Ms. Francina L Hitt's confusion is not clear, and may be multifactorial contributed by recent medication changes. Notably, the patient's admission CT head is negative for acute abnormalities although chronic atrophy and microvascular ischemic changes were evidenced. Interventions at this time will be focused on holding sedative medications, monitoring on telemetry, and observing overnight for clearing of mentation.    Altered mental status - unclear etiology, however likely influenced by recent medication changes. Specifically, it seems that the patient was just started on gabapentin 5 days prior to admission. As well, the patient was prescribed Percocet 5/325 mg on 03/21/2011 at the time of discharge from inpatient rehabilitation, she was written for 60 pills, which she apparently ran out of on the day of admission. Therefore, it seems that the patient has been very regularly taking her opiates, which in this elderly patient regularly with ESRD, this could certainly account for her increased confusion and slowness of thought process due to delayed clearance. There is no evident source of infection at this time, as the patient remained afebrile, without leukocytosis, or focal symptoms to indicate specific acute infectious etiology. However, given the loose stools noted in her colostomy bag, and that she's recently been treated with for her ischemic colitis (still on treatment), we will also investigate for possible C. Difficile (although again, less likely given that the patient has remained afebrile and without leukocytosis).  Hold sedative medications including her gabapentin and Percocet   No new focal neurologic deficits appreciated.   ESRD on HD (Tues, Thurs, Sat on [employer]) - patient  apparently had a right nephrectomy in 1990s, and is on the transplant list at [facility]. In the meantime, she continues to her regular dialysis sessions. Last  dialysis session was on 04/02/2011, tolerated all but 30 minutes of the session.   Will contact renal service to continue patient per her regular dialysis sessions   Avoid nephrotoxic agents as much as possible   Question of borderline diabetes - no A1c could be found per chart review, although she carries this diagnosis. Will further investigate with HgA1c because if the pt were experiencing prolonged hypo- or hyper-glycemia at home, could contribute towards her confusion/ mental status changes.  Check A1c.  Start SSI if indicated.   History of hypertension - the patient carries this diagnosis, and seems to previously had hypertension which contribute towards her ESRD. However within 2012, during hospital admission has remained normotensive without use of antihypertensive agents. Therefore, this problem can likely be removed from her problem list.  Continue to monitor.   Sinus tachycardia - chronic at least since admission in 01/2011, patient has remained in sinus tach with rates between 100-120s. Possible mild volume depletion in setting of HD (unclear of her true dry weight, but currently 114 lbs and previously in November was up to 120s lbs). No evidence of acute pain, infection, heart failure, or blood loss to contribute.  Will check TSH.  Check orthostatic vital signs to assess volume status - very careful volume repletion if needed (but has already received fluids in ER, so likely will not administer additional).  Monitor on telemetry.   Anemia of chronic disease - BL Hgb 8-10. Currently stable.   Continue Aranesp per renal service.   History of ischemic colitis - s/p partial colectomy of right hemicolon with ileostomy per Dr. Ninfa Linden. Patient continues to be on Cipro therapy until follow-up with Dr. Ninfa Linden. Ileostomy stump looks healthy without surrounding maceration of skin or friability.  Wound care consult for continued ostomy management.  Continue cipro - will  change to once daily given ESRD status.  Will recommend close follow-up with Dr. Ninfa Linden as an outpatient unless acute issues arise requiring inpatient evaluation.   Lumbar stenosis s/p lumbar disc surgery - wound well healing. Patient's description of pain frequency is not consistent with her running out of 60 Percocet since discharge on 1/3. Secondary to AMS, will hold sedative medications.  Tylenol for pain.   DVT PPX - heparin   Vertell Novak, M.D.  PGY-I, Internal Medicine Resident 04/03/2011, 11:00 PM

## 2011-04-04 DIAGNOSIS — I498 Other specified cardiac arrhythmias: Secondary | ICD-10-CM | POA: Insufficient documentation

## 2011-04-04 DIAGNOSIS — R4182 Altered mental status, unspecified: Secondary | ICD-10-CM | POA: Diagnosis present

## 2011-04-04 DIAGNOSIS — N186 End stage renal disease: Secondary | ICD-10-CM

## 2011-04-04 LAB — CBC
Hemoglobin: 11.1 g/dL — ABNORMAL LOW (ref 12.0–15.0)
MCH: 27.7 pg (ref 26.0–34.0)
MCHC: 30.8 g/dL (ref 30.0–36.0)
MCV: 89.8 fL (ref 78.0–100.0)
Platelets: 338 10*3/uL (ref 150–400)
RBC: 4.01 MIL/uL (ref 3.87–5.11)
WBC: 7.6 10*3/uL (ref 4.0–10.5)

## 2011-04-04 LAB — RENAL FUNCTION PANEL
Albumin: 3.1 g/dL — ABNORMAL LOW (ref 3.5–5.2)
BUN: 41 mg/dL — ABNORMAL HIGH (ref 6–23)
CO2: 31 mEq/L (ref 19–32)
Chloride: 96 mEq/L (ref 96–112)
Creatinine, Ser: 6.68 mg/dL — ABNORMAL HIGH (ref 0.50–1.10)
GFR calc Af Amer: 7 mL/min — ABNORMAL LOW (ref >90)
GFR calc non Af Amer: 6 mL/min — ABNORMAL LOW (ref >90)
Potassium: 4.6 mEq/L (ref 3.5–5.1)
Sodium: 140 mEq/L (ref 135–145)

## 2011-04-04 LAB — DIFFERENTIAL
Basophils Absolute: 0.1 10*3/uL (ref 0.0–0.1)
Eosinophils Absolute: 0.4 10*3/uL (ref 0.0–0.7)
Eosinophils Relative: 5 % (ref 0–5)
Lymphs Abs: 2.7 10*3/uL (ref 0.7–4.0)
Neutro Abs: 3.5 10*3/uL (ref 1.7–7.7)
Neutrophils Relative %: 47 % (ref 43–77)

## 2011-04-04 LAB — TSH: TSH: 1.091 u[IU]/mL (ref 0.350–4.500)

## 2011-04-04 LAB — CLOSTRIDIUM DIFFICILE BY PCR: Toxigenic C. Difficile by PCR: NEGATIVE

## 2011-04-04 MED ORDER — SODIUM POLYSTYRENE SULFONATE 15 GM/60ML PO SUSP
30.0000 g | Freq: Once | ORAL | Status: AC
  Administered 2011-04-04: 30 g via ORAL
  Filled 2011-04-04: qty 120

## 2011-04-04 MED ORDER — PENTAFLUOROPROP-TETRAFLUOROETH EX AERO: 1.0000 "application " | INHALATION_SPRAY | CUTANEOUS | Status: DC | PRN

## 2011-04-04 MED ORDER — HEPARIN SODIUM (PORCINE) 1000 UNIT/ML DIALYSIS
1000.0000 [IU] | INTRAMUSCULAR | Status: DC | PRN
  Administered 2011-04-06: 1000 [IU] via INTRAVENOUS_CENTRAL
  Filled 2011-04-04: qty 1

## 2011-04-04 MED ORDER — DIPHENHYDRAMINE HCL 50 MG PO CAPS
50.0000 mg | ORAL_CAPSULE | Freq: Once | ORAL | Status: AC
  Administered 2011-04-05: 50 mg via ORAL
  Filled 2011-04-04: qty 1

## 2011-04-04 MED ORDER — CAMPHOR-MENTHOL 0.5-0.5 % EX LOTN
1.0000 "application " | TOPICAL_LOTION | Freq: Three times a day (TID) | CUTANEOUS | Status: DC | PRN
  Filled 2011-04-04: qty 222

## 2011-04-04 MED ORDER — ALTEPLASE 2 MG IJ SOLR
2.0000 mg | Freq: Once | INTRAMUSCULAR | Status: AC | PRN
  Filled 2011-04-04: qty 2

## 2011-04-04 MED ORDER — TRAMADOL HCL 50 MG PO TABS
50.0000 mg | ORAL_TABLET | Freq: Four times a day (QID) | ORAL | Status: AC
  Administered 2011-04-04 – 2011-04-06 (×6): 50 mg via ORAL
  Filled 2011-04-04 (×8): qty 1

## 2011-04-04 MED ORDER — ONDANSETRON HCL 4 MG PO TABS: 4.0000 mg | ORAL_TABLET | Freq: Four times a day (QID) | ORAL | Status: DC | PRN

## 2011-04-04 MED ORDER — SORBITOL 70 % SOLN: 30.0000 mL | Status: DC | PRN

## 2011-04-04 MED ORDER — ONDANSETRON HCL 4 MG/2ML IJ SOLN
4.0000 mg | Freq: Four times a day (QID) | INTRAMUSCULAR | Status: DC | PRN
  Administered 2011-04-06 – 2011-04-07 (×3): 4 mg via INTRAVENOUS
  Filled 2011-04-04 (×3): qty 2

## 2011-04-04 MED ORDER — LIDOCAINE-PRILOCAINE 2.5-2.5 % EX CREA: 1.0000 "application " | TOPICAL_CREAM | CUTANEOUS | Status: DC | PRN

## 2011-04-04 MED ORDER — DARBEPOETIN ALFA-POLYSORBATE 200 MCG/0.4ML IJ SOLN
100.0000 ug | INTRAMUSCULAR | Status: DC
  Administered 2011-04-06: 100 ug via INTRAVENOUS
  Filled 2011-04-04: qty 0.4

## 2011-04-04 MED ORDER — HYDROXYZINE HCL 25 MG PO TABS: 25.0000 mg | ORAL_TABLET | Freq: Three times a day (TID) | ORAL | Status: DC | PRN

## 2011-04-04 MED ORDER — PREDNISONE 50 MG PO TABS
50.0000 mg | ORAL_TABLET | Freq: Four times a day (QID) | ORAL | Status: AC
  Administered 2011-04-04 – 2011-04-05 (×2): 50 mg via ORAL
  Filled 2011-04-04 (×3): qty 1

## 2011-04-04 MED ORDER — SODIUM CHLORIDE 0.9 % IV SOLN: 100.0000 mL | INTRAVENOUS | Status: DC | PRN

## 2011-04-04 MED ORDER — LIDOCAINE HCL (PF) 1 % IJ SOLN: 5.0000 mL | INTRAMUSCULAR | Status: DC | PRN

## 2011-04-04 MED ORDER — ZOLPIDEM TARTRATE 5 MG PO TABS
5.0000 mg | ORAL_TABLET | Freq: Every evening | ORAL | Status: DC | PRN
  Administered 2011-04-07 – 2011-04-09 (×2): 5 mg via ORAL
  Filled 2011-04-04 (×2): qty 1

## 2011-04-04 MED ORDER — NEPRO/CARBSTEADY PO LIQD: 237.0000 mL | Freq: Three times a day (TID) | ORAL | Status: DC | PRN

## 2011-04-04 MED ORDER — DARBEPOETIN ALFA-POLYSORBATE 200 MCG/0.4ML IJ SOLN: 200.0000 ug | INTRAMUSCULAR | Status: DC

## 2011-04-04 MED ORDER — SODIUM CHLORIDE 0.9 % IV SOLN
125.0000 mg | Freq: Once | INTRAVENOUS | Status: AC
  Administered 2011-04-04: 125 mg via INTRAVENOUS
  Filled 2011-04-04 (×2): qty 10

## 2011-04-04 MED ORDER — DOCUSATE SODIUM 283 MG RE ENEM: 1.0000 | ENEMA | RECTAL | Status: DC | PRN

## 2011-04-04 MED ORDER — CALCIUM CARBONATE 1250 MG/5ML PO SUSP: 500.0000 mg | Freq: Four times a day (QID) | ORAL | Status: DC | PRN

## 2011-04-04 MED ORDER — TRAMADOL HCL 50 MG PO TABS
50.0000 mg | ORAL_TABLET | Freq: Once | ORAL | Status: DC
  Filled 2011-04-04 (×2): qty 1

## 2011-04-04 NOTE — Consult Note (Signed)
WOC ostomy consult  Stoma type/location:  Pt with ileostomy surgery from several months ago.  Daughter assists with pouch changes at home and patient states she is able to assist with emptying. Stomal assessment/size: Stoma red and viable, above skin level Peristomal assessment:  Intact skin surrounding. Output  100cc liquid green stool. Ostomy pouching: 1pc flexible pouch applied and supplies ordered to room for staff use. Will not plan to follow further unless re-consulted.  [address], [place], MSN, [place]

## 2011-04-04 NOTE — Progress Notes (Signed)
Pt arrived to the floor via stretcher accompanied by ED staff. Transferred to the bed and admission assessment and history completed. Bed lowered to lowest position, wheels locked, and bed alarm turned on. Pt has no complaints of pain or shortness of breath. Will continue to assess pt periodically.

## 2011-04-04 NOTE — ED Provider Notes (Signed)
I saw and evaluated the patient, reviewed the resident's note and I agree with the findings and plan.  Leota Jacobsen, MD 04/04/11 [id number]

## 2011-04-04 NOTE — Consult Note (Signed)
[facility] Renal Consultation Note  Indication for Consultation:  Management of ESRD/hemodialysis; anemia, hypertension/volume and secondary hyperparathyroidism  HPI: [name] is a 61 y.o. AA female with  ESRD on chronic HD support every TTS at [facility]. She presented to the ED accompanied by her spouse due to AMS and increased confusion following a recent prolonged and complicated hospitalization 11/12 to 03/21/11 s/p lumbar spine surgery complicated by ischemic colitis and need for partial colectomy and ileostomy. She also has a past history of ETOH use (currently denies since prior to recent hospitalization) despite mention in the admission H&P but admits taking prn oxycodone with frequency unknown at this time. CT head on adm chronic ischemic changes without acute findings. Admission for further treatment and evaluation. Renal Consult requested for ongoing RRT. Unfortunately she has a clotted dialysis access on admission and a IV contrast allergy with plans for HD tomorrow following declot and ccan later resume her normal outpatient treatment regimen.  Dialysis Orders: Center: [place] on TTS. EDW 51KgHD Bath 2K/2.25Ca  Time 3:30 Heparin tight. Access RUA AVG (placed 1996) BFR 400 DFR A1.5    Zemplar none mcg IV/HD Epogen 28,000 Units IV/HD  Venofer 100mg  IV qtx (duration unknown) Other Optiflux 160; UF profile 2; linear 148; # 15g needles  Past Medical History  Diagnosis Date  . Seizures     r/t HTN in 1990's x 1  . Brain aneurysm     No records could be found  . Blood transfusion 1990's    r/t Kidney removal surgery  . Arthritis     Back  . Anemia     anemia of chronic disease likely 2/2 ESRD per last anemia panel (02/2011) with Fe 35, TIBC 167, ferritin 2041  // BL Hgb 8-10  . Umbilical hernia age 38  . Heart murmur     Born with heart mumur, does not require follow up per pt  . Hypertension     Does not see a heart doctor, had pre transplant stress test at  [facility]    . Diabetes mellitus     Borderline  . End stage renal disease on dialysis     Secondary to hypertension // T/Th/Sat dialysis on [employer]  . Hepatitis C   . Colitis, ischemic 01/2011    S/P partial colectomy of right hemicolon and ileostomy placement  . Lumbar spinal stenosis      bilateral L4-5 and L5-S1 posterior lumbar interbody arthrodesis and bilateral L4-S1 posterior lateral arthrodesis for lumbar stenosis dynamic lumbar spondylolisthesis lumbar spondylosis lumbar radiculopathy with foot drop // s/p surgery 01/2011 - see surgery section for details.   Past Surgical History  Procedure Date  . Nephrectomy 2010    right side done at [facility] on transplant list  . Umbilical hernia repair age 49- 56  . Appendectomy 1960's  . Vascular surgery     right arm dialysis graft  . Tonsillectomy     as a child  . Rib resection     d/t kidney removal  . Back surgery 01/30/2011    L4-S1 lumbar laminectomy facetectomy and foraminotomies for decompression a bilateral L4-5 and L5-S1 posterior lumbar interbody arthrodesis and bilateral L4-S1 posterior lateral arthrodesis for lumbar stenosis dynamic lumbar spondylolisthesis lumbar spondylosis lumbar radiculopathy with foot drop  . Hernia repair   . Laparotomy 02/09/2011    Procedure: EXPLORATORY LAPAROTOMY;  Surgeon: Harl Bowie, MD;  Location: [place];  Service: General;  Laterality: N/A;  . Partial colectomy 02/09/2011  Reason for surgery: ischemic colitis of right hemicolon; Surgeon: Harl Bowie, MD;  Location: [place];  Service: General;  Laterality: Right;  . Colostomy 02/09/2011    Reason for surgery: ischemic colitis of right hemicolon requiring partial colectomy and ileostomy.  Surgeon: Harl Bowie, MD;  Location: [place];  Service: General;  Laterality: Right;  . Colonoscopy 02/19/2011    Procedure: COLONOSCOPY;  Surgeon: Missy Sabins, MD;  Location: [place];  Service: Endoscopy;  Laterality: N/A;  .  Colonoscopy 03/07/2011    Procedure: COLONOSCOPY;  Surgeon: Cleotis Nipper, MD;  Location: [facility] END[facility]COPY;  Service: Endoscopy;  Laterality: N/A;  no prep needed--pt has long Hartmann's pouch and ileostomy  . Total parathyroidectomy 01/17/2001    with autotransplantation into left forearm // due to secondary hyperparathyroidism from ESRD   Family History  Problem Relation Age of Onset  . Anesthesia problems Neg Hx   . Hypotension Neg Hx   . Malignant hyperthermia Neg Hx   . Pseudochol deficiency Neg Hx   . Diabetes Father     reports that she quit smoking about 7 months ago. Her smoking use included Cigarettes. She has a 24 pack-year smoking history. She has never used smokeless tobacco. She reports that she drinks about 1.2 ounces of alcohol per week. She reports that she does not use illicit drugs. Allergies  Allergen Reactions  . Contrast Media (Iodinated Diagnostic Agents)     Seziure  . Iohexol Other (See Comments)    Reaction is convulsions   Prior to Admission medications   Medication Sig Start Date End Date Taking? Authorizing Provider  ciprofloxacin (CIPRO) 500 MG tablet Take 500 mg by mouth 2 (two) times daily.   Yes Historical Provider, MD  darbepoetin (ARANESP) 200 MCG/0.4ML SOLN Inject 200 mcg into the skin. Administered at dialysis per nephrology   Yes Historical Provider, MD  gabapentin (NEURONTIN) 300 MG capsule Take 300 mg by mouth daily. Started Friday PTA   Yes Historical Provider, MD  multivitamin (RENA-VIT) TABS tablet Take 1 tablet by mouth daily. 03/20/11  Yes Lavon Paganini. [place], PA  oxyCODONE-acetaminophen (PERCOCET) 5-325 MG per tablet Take 1 tablet by mouth every 4 (four) hours as needed.   Yes Historical Provider, MD  pantoprazole (PROTONIX) 40 MG tablet Take 1 tablet (40 mg total) by mouth daily at 12 noon. 03/20/11 03/19/12 Yes Daniel J. [place], PA   I have reviewed the patient's current medications. Results for orders placed during the hospital encounter of  04/03/11 (from the past 48 hour(s))  CBC     Status: Abnormal   Collection Time   04/03/11  5:16 PM      Component Value Range Comment   WBC 8.8  4.0 - 10.5 (K/uL)    RBC 3.86 (*) 3.87 - 5.11 (MIL/uL)    Hemoglobin 10.8 (*) 12.0 - 15.0 (g/dL)    HCT 35.5 (*) 36.0 - 46.0 (%)    MCV 92.0  78.0 - 100.0 (fL)    MCH 28.0  26.0 - 34.0 (pg)    MCHC 30.4  30.0 - 36.0 (g/dL)    RDW 18.8 (*) 11.5 - 15.5 (%)    Platelets 323  150 - 400 (K/uL)   DIFFERENTIAL     Status: Abnormal   Collection Time   04/03/11  5:16 PM      Component Value Range Comment   Neutrophils Relative 46  43 - 77 (%)    Neutro Abs 4.1  1.7 - 7.7 (K/uL)  Lymphocytes Relative 32  12 - 46 (%)    Lymphs Abs 2.8  0.7 - 4.0 (K/uL)    Monocytes Relative 16 (*) 3 - 12 (%)    Monocytes Absolute 1.4 (*) 0.1 - 1.0 (K/uL)    Eosinophils Relative 5  0 - 5 (%)    Eosinophils Absolute 0.4  0.0 - 0.7 (K/uL)    Basophils Relative 1  0 - 1 (%)    Basophils Absolute 0.1  0.0 - 0.1 (K/uL)   COMPREHENSIVE METABOLIC PANEL     Status: Abnormal   Collection Time   04/03/11  5:16 PM      Component Value Range Comment   Sodium 135  135 - 145 (mEq/L)    Potassium 4.7  3.5 - 5.1 (mEq/L)    Chloride 91 (*) 96 - 112 (mEq/L)    CO2 31  19 - 32 (mEq/L)    Glucose, Bld 83  70 - 99 (mg/dL)    BUN 38 (*) 6 - 23 (mg/dL)    Creatinine, Ser 5.62 (*) 0.50 - 1.10 (mg/dL)    Calcium 10.9 (*) 8.4 - 10.5 (mg/dL)    Total Protein 8.4 (*) 6.0 - 8.3 (g/dL)    Albumin 3.1 (*) 3.5 - 5.2 (g/dL)    AST 31  0 - 37 (U/L)    ALT 15  0 - 35 (U/L)    Alkaline Phosphatase 119 (*) 39 - 117 (U/L)    Total Bilirubin 0.3  0.3 - 1.2 (mg/dL)    GFR calc non Af Amer 7 (*) >90 (mL/min)    GFR calc Af Amer 9 (*) >90 (mL/min)   PROTIME-INR     Status: Normal   Collection Time   04/03/11  5:16 PM      Component Value Range Comment   Prothrombin Time 13.9  11.6 - 15.2 (seconds)    INR 1.05  0.00 - 1.49    AMMONIA     Status: Normal   Collection Time   04/03/11  5:17 PM        Component Value Range Comment   Ammonia 17  11 - 60 (umol/L)   PRO B NATRIURETIC PEPTIDE     Status: Abnormal   Collection Time   04/03/11  5:20 PM      Component Value Range Comment   Pro B Natriuretic peptide (BNP) 2300.0 (*) 0 - 125 (pg/mL)   TROPONIN I     Status: Normal   Collection Time   04/03/11  5:21 PM      Component Value Range Comment   Troponin I <0.30  <0.30 (ng/mL)   LACTIC ACID, PLASMA     Status: Normal   Collection Time   04/03/11  5:21 PM      Component Value Range Comment   Lactic Acid, Venous 1.1  0.5 - 2.2 (mmol/L)   MRSA PCR SCREENING     Status: Normal   Collection Time   04/03/11 10:38 PM      Component Value Range Comment   MRSA by PCR NEGATIVE  NEGATIVE    RENAL FUNCTION PANEL     Status: Abnormal   Collection Time   04/04/11  6:40 AM      Component Value Range Comment   Sodium 140  135 - 145 (mEq/L)    Potassium 4.6  3.5 - 5.1 (mEq/L)    Chloride 96  96 - 112 (mEq/L)    CO2 31  19 - 32 (mEq/L)  Glucose, Bld 78  70 - 99 (mg/dL)    BUN 41 (*) 6 - 23 (mg/dL)    Creatinine, Ser 6.68 (*) 0.50 - 1.10 (mg/dL)    Calcium 11.4 (*) 8.4 - 10.5 (mg/dL)    Phosphorus 3.4  2.3 - 4.6 (mg/dL)    Albumin 3.1 (*) 3.5 - 5.2 (g/dL)    GFR calc non Af Amer 6 (*) >90 (mL/min)    GFR calc Af Amer 7 (*) >90 (mL/min)   CBC     Status: Abnormal   Collection Time   04/04/11  6:40 AM      Component Value Range Comment   WBC 7.6  4.0 - 10.5 (K/uL)    RBC 4.01  3.87 - 5.11 (MIL/uL)    Hemoglobin 11.1 (*) 12.0 - 15.0 (g/dL)    HCT 36.0  36.0 - 46.0 (%)    MCV 89.8  78.0 - 100.0 (fL)    MCH 27.7  26.0 - 34.0 (pg)    MCHC 30.8  30.0 - 36.0 (g/dL)    RDW 18.4 (*) 11.5 - 15.5 (%)    Platelets 338  150 - 400 (K/uL)   DIFFERENTIAL     Status: Normal   Collection Time   04/04/11  6:40 AM      Component Value Range Comment   Neutrophils Relative 47  43 - 77 (%)    Neutro Abs 3.5  1.7 - 7.7 (K/uL)    Lymphocytes Relative 35  12 - 46 (%)    Lymphs Abs 2.7  0.7 - 4.0  (K/uL)    Monocytes Relative 12  3 - 12 (%)    Monocytes Absolute 0.9  0.1 - 1.0 (K/uL)    Eosinophils Relative 5  0 - 5 (%)    Eosinophils Absolute 0.4  0.0 - 0.7 (K/uL)    Basophils Relative 1  0 - 1 (%)    Basophils Absolute 0.1  0.0 - 0.1 (K/uL)    ROS: drowsiness, pain/swelling left foot; confusion as reported only by family; otherwise negative  Physical Exam: Filed Vitals:   04/04/11 0500  BP: 150/68  Pulse: 116  Temp: 99.2 F (37.3 C)  Resp: 18     General: comfortable Awake and alert Heart: RRR, 2-3/6 SEM Lungs: CTAB Abdomen: RLQ colostomy with stoma pink; liquid yellow brown stool in bag Extremities: bilat foot drop with left ankle/foot edema Skin: intact Neuro: confused, but pleasant and cooperative Ox2 Dialysis Access: RUA AVG large pseudoaneurysms; no palpable thrill or audible bruit   Assessment/Plan: 1. AMS- etiology unknown but possibly secondary to medications (recently started on Neurontin, Oxycodone 1-2 q4hr prn # 60  Given on 1/3); and taking Cipro BID at time of adm and/or Hypercalcemia (11.4); still somewhat confused but improving per daughter @ bedside; unfortunately has clotted access to assist with med clearance; also has low grade temp (99.2); CXR on adm negative for pneumonia 2. ESRD -  TTS [place]; HD tomorrow off schedule (see # 3) 3. Vascular Access- RUA AVG clotted; has contrast allergy with K level 4.6; give 30gm Kaxexelate x 1 dose and attempt declot in IR tomorrow as will need 13hr premedication (of note, pt with ^VP on 1/15 HD treatment and Access flow schedule for today) 3 Hypertension/volume  - fairly well controlled meds and UF on HD 4. Anemia  - Hgb 11.1 today; was 9.5 on 03/28/11; Max Epogen and iron bolus in outpt setting; continue iron bolus for now and determine duration; decrease dose 50% (  Aranesp 1105mcg) with next dose scheduled for Sat; follow trend following declot tomorrow 5. Metabolic bone disease -  Phos 3.4; Ca 11.4; discontinue outpt  binders (Calci-chew 1250mg ); Low Ca bath with HD; no Zemplar and follow 6. Nutrition - albumin 3.1; appetite improving since discharged (1/3); follow 7. Ischemic Colitis- s/p right hemicolectomy and ileostomy- abdominal abscess and discharged on Cipro 500mg  daily until seen by Dr. Rush Farmer (03/21/11 discharge sumary) ; unclear when/why dose increased BID prior to adm 8. Disposition- per primary services   Marni Griffon, FNP-C [facility] Pager [phone] 04/04/2011, 8:18 AM   Attending Nephrologist: Dr. Fleet Contras   I have seen and examined this patient and agree with plan Pt mental status improved but not at baseline.  Probable multifactorial in origin and due to gabapentin, percocet and hypercalcemia.  Will arrange declot of graft and plan HD on lower Ca++ bath.  DC Ca++ supp prn Idamay Hosein T,MD 04/04/2011 1:45 PM

## 2011-04-04 NOTE — Progress Notes (Addendum)
Subjective: No acute events overnight.  Patient is A&Ox2 this morning, knowing her name and location, but unable to recall the year.  She has no physical complaints, is eating well, and having good output from her iliostomy.   Objective: Vital signs in last 24 hours: Filed Vitals:   04/03/11 2130 04/03/11 2252 04/03/11 2300 04/04/11 0500  BP: 129/87 144/74  150/68  Pulse: 118 116  116  Temp:  99.4 F (37.4 C)  99.2 F (37.3 C)  TempSrc:  Oral  Oral  Resp: 16 18  18   Height:   5\' 5"  (1.651 m)   Weight:   56 kg (123 lb 7.3 oz)   SpO2: 95% 94%  94%   Weight change:   Intake/Output Summary (Last 24 hours) at 04/04/11 0919 Last data filed at 04/04/11 0631  Gross per 24 hour  Intake      0 ml  Output      0 ml  Net      0 ml   Physical Exam: BP 150/68  Pulse 116  Temp(Src) 99.2 F (37.3 C) (Oral)  Resp 18  Ht 5\' 5"  (1.651 m)  Wt 56 kg (123 lb 7.3 oz)  BMI 20.54 kg/m2  SpO2 94% General: Well-developed, well-nourished, in no acute distress; alert, appropriate and cooperative throughout examination. Appears older than stated age. Head: Normocephalic, atraumatic.  Eyes: PERRL, EOMI, No signs of anemia or jaundince.  Neck: No deformities, masses, or tenderness noted.Supple, No carotid Bruits, no JVD.  Lungs: Normal respiratory effort. Clear to auscultation BL without crackles or wheezes.  Heart: RRR. 4/6 systolic murmur auscultated throughout precordium, does not radiate to carotids.  Abdomen: BS normoactive. Soft, Nondistended, non-tender. No masses or organomegaly.  Iliostomy present in RLQ with yellow output.  No surrounding erythema or  Exudate.  Clean, dry, intact.   Extremities: No pretibial edema. Renal access on R upper arm flow auscultated.  Neurologic: A&O X2, CN II - XII are grossly intact. Motor strength is 5/5 in 3/4 extremities, Strength decreased in left foot post-spine surgery in 11/12.  Sensations intact to light touch, Cerebellar signs negative.  Skin: No visible  rashes.  Well healed surgical scar along lumbar spine from previous surgery.   Lab Results: Basic Metabolic Panel:  Lab [id number] 04/03/11 1716  NA 140 135  K 4.6 4.7  CL 96 91*  CO2 31 31  GLUCOSE 78 83  BUN 41* 38*  CREATININE 6.68* 5.62*  CALCIUM 11.4* 10.9*  MG -- --  PHOS 3.4 --   Liver Function Tests:  Lab 04/04/11 0640 04/03/11 1716  AST -- 31  ALT -- 15  ALKPHOS -- 119*  BILITOT -- 0.3  PROT -- 8.4*  ALBUMIN 3.1* 3.1*    Lab 04/03/11 1717  AMMONIA 17   CBC:  Lab 04/04/11 0640 04/03/11 1716  WBC 7.6 8.8  NEUTROABS 3.5 4.1  HGB 11.1* 10.8*  HCT 36.0 35.5*  MCV 89.8 92.0  PLT 338 323   Cardiac Enzymes:  Lab 04/03/11 1721  CKTOTAL --  CKMB --  CKMBINDEX --  TROPONINI <0.30   BNP:  Lab 04/03/11 1720  PROBNP 2300.0*   Coagulation:  Lab 04/03/11 1716  LABPROT 13.9  INR 1.05    Micro Results: Recent Results (from the past 240 hour(s))  MRSA PCR SCREENING     Status: Normal   Collection Time   04/03/11 10:38 PM      Component Value Range Status Comment   MRSA by PCR  NEGATIVE  NEGATIVE  Final   CLOSTRIDIUM DIFFICILE BY PCR     Status: Normal   Collection Time   04/04/11 12:04 AM      Component Value Range Status Comment   C difficile by pcr NEGATIVE  NEGATIVE  Final    Studies/Results: Dg Chest 2 View  04/03/2011  *RADIOLOGY REPORT*  Clinical Data: Altered level of consciousness.  CHEST - 2 VIEW  Comparison: 02/15/2011  Findings: Cardiac enlargement.  COPD.  Negative for heart failure. Mild bibasilar scarring.  Negative for pneumonia or effusion.  IMPRESSION: COPD with bibasilar scarring.  No acute cardiopulmonary disease.  Original Report Authenticated By: Truett Perna, M.D.   Ct Head Wo Contrast  04/03/2011  *RADIOLOGY REPORT*  Clinical Data: Confusion, fatigue, hypotension  CT HEAD WITHOUT CONTRAST  Technique:  Contiguous axial images were obtained from the base of the skull through the vertex without contrast.  Comparison: None.   Findings: Brain atrophy evident with periventricular chronic microvascular ischemic changes diffusely.  Slight ventricular enlargement.  No acute intracranial hemorrhage, definite infarction, mass lesion, midline shift, herniation, or extra-axial fluid collection.  Cisterns patent.  Cerebellar atrophy as well. Symmetric orbits.  Mastoids and sinuses clear.  IMPRESSION: Atrophy and microvascular chronic ischemic changes.  No acute intracranial finding.  Original Report Authenticated By: Jerilynn Mages. Daryll Brod, M.D.   Medications:  Scheduled Meds:   . ciprofloxacin  500 mg Oral Q breakfast  . darbepoetin  200 mcg Intravenous Q7 days  . heparin  5,000 Units Subcutaneous Q8H  . multivitamin  1 tablet Oral Daily  . pantoprazole  40 mg Oral Q1200  . sodium chloride  500 mL Intravenous Once  . sodium chloride  3 mL Intravenous Q12H  . DISCONTD: ciprofloxacin  500 mg Oral BID  . DISCONTD: ciprofloxacin  500 mg Oral Q breakfast   Continuous Infusions:  PRN Meds:.sodium chloride, acetaminophen, acetaminophen, sodium chloride Assessment/Plan:  ##: Altered mental status - Unclear etiology, but likely multifactorial including recent medication changes and alcohol intake in combination with ESRD. Specifically, it seems that the patient was just started on gabapentin 5 days prior to admission. As well, the patient was prescribed Percocet 5/325 mg on 03/21/2011 at the time of discharge from inpatient rehabilitation on 03/21/11.  At that time, she was written for 60 pills, which she apparently ran out of on the day of admission. Therefore, it seems that the patient has been very regularly taking her opiates, which in this elderly patient with ESRD and therefore poor renal clearance of medications, could account for her increased confusion. There is no evident source of infection at this time, as the patient remained afebrile, without leukocytosis, or focal symptoms.  - Head CT remarkable only for chronic atrophy and  microvascular ischemic changes.   - C. Diff negative today.   - No new focal neurologic symptoms noted today. - Continue to hold sedative medications including gabapentin and percocet - Resume HD today with nephrology inpatient - Will call patient's daughter/caretaker to better assess baseline mental status.  ##: ESRD on HD (Tues, Thurs, Sat on [employer]) - patient apparently had a right nephrectomy in 1990s, and is on the transplant list at [facility]. In the meantime, she continues to her regular dialysis sessions. Last dialysis session was on 04/02/2011, tolerated all but 30 minutes of the session. - Nephrology consulted.  The patient is well known to them.  They will resume her regular dialysis schedule today. - Avoid nephrotoxic agents if possible.  ##:  Question of borderline diabetes - no A1c could be found per chart review, although she carries this diagnosis.  - HbA1C pending - Start SSI if patient has hyperglycemia  ##:  History of hypertension -During the patient's prolonged hospital admission from 01/2011-03/2011,  she remained normotensive without use of antihypertensive agents. Therefore, this problem can likely be removed from her problem list.  - Continue to monitor  ##: Sinus tachycardia - chronic at least since admission in 01/2011.  Patient has remained in sinus tach with rates between 100-120s. No evidence of acute pain, infection, heart failure, or blood loss to contribute.  EKG shows stable sinus tachycardia. - TSH pending - Monitor on Telemetry - HD will monitor fluid status closely  ##: Anemia of chronic disease - BL Hgb 8-10. Currently stable. - Continue Aranesp per renal.  ##: History of ischemic colitis - s/p partial colectomy of right hemicolon with ileostomy per Dr. Ninfa Linden. Patient continues to be on Cipro therapy until follow-up with Dr. Ninfa Linden. Ileostomy stump looks healthy without surrounding maceration of skin or friability. - C. Diff negative today -  Wound care consult for continued ostomy management - Continue ciprofloxacin once daily  ##: Lumbar stenosis s/p lumbar disc surgery in 11/12 - Wound has healed well.  Patient denies back pain.  Reports neuropathic leg and foot pain.   - Continue to hold percocet. - Tylenol for pain.    ##: DVT PPx - SQ heparin    LOS: 1 day   Keturah Shavers 04/04/2011, 9:19 AM  PGY 1 Addendum: Patient unable to be dialyzed today due to clotted dialysis access, which is to be corrected by IR tomorrow, and will then have HD.  She was treated with 1 kayexelate dose.

## 2011-04-04 NOTE — H&P (Signed)
Agree with PA note. 

## 2011-04-04 NOTE — H&P (Signed)
I discussed [name] with the team and agree with their note, assessment, and plan. Please see Dr Donneta Romberg H&P for full details. Briefly, [name] was transferred from [facility] where she had been [place] after a long and complicated (lumbar surgery complicated with B foot drop, ischemic colitis, s/p partial colectomy) hospital course. She was transferred for AMS. The pt denies any complaints today. The daughter was not at the bedside but renal's note states that the confusion is improved today. On exam today she has a holosystolic murmer. She is alert to place only. She named 3/3 items but had 0/3 5 min recall. Her clock was OK but unable to set time at 2:15.   1. AMS is likely multifactorial inc meds (opioids and gabapentin are held), hypercalcemia (will be corrected with HD bath). No obvious source of infxn. No other metabolic derangements. No coronary ischemia.   2. S/p lumbar disc surgery with foot drop. Pt will need PT/OT continued in hospital.   3. S/p partial colectomy 2/2 ischemic colitis - ostomy services has seen pt.  We will follow mental status improvement. Cont PT/OT.

## 2011-04-04 NOTE — H&P (Signed)
  Patient set up for declot procedure of right upper extremity graft due to clotted access.  Patient A&O x2.  Daughter present given slight confusion.  Discussed with them both the procedure details, potential complications of bleeding, infection, vessel damage, contrast reaction despite premeds, complications with moderate sedation and unsuccessful declot.  If unsuccessful, patient will require HD catheter placement.  They both verbalized their understanding and are in agreement to proceed with declot tomorrow after 13 hour premeds taken.  Heart and lung exam is consistent with that by primary care service earlier today.   Park Liter, PA-C

## 2011-04-04 NOTE — Progress Notes (Signed)
Physical Therapy Evaluation Patient Details Name: [name] MRN: [id number] DOB: April 17, 1950 Today's Date: 04/04/2011  Problem List:  Patient Active Problem List  Diagnoses  . Lumbar stenosis with neurogenic claudication  . Spondylolisthesis of lumbar region  . End stage renal disease on dialysis  . Hypertension  . Unspecified viral hepatitis C without hepatic coma  . Radiculopathy  . Anemia of chronic kidney failure  . Acute ischemic colitis  . Radiculopathy of lumbar region  . Altered mental status  . Sinus tachycardia    Past Medical History:  Past Medical History  Diagnosis Date  . Seizures     r/t HTN in 1990's x 1  . Brain aneurysm     No records could be found  . Blood transfusion 1990's    r/t Kidney removal surgery  . Arthritis     Back  . Anemia     anemia of chronic disease likely 2/2 ESRD per last anemia panel (02/2011) with Fe 35, TIBC 167, ferritin 2041  // BL Hgb 8-10  . Umbilical hernia age 31  . Heart murmur     Born with heart mumur, does not require follow up per pt  . Hypertension     Does not see a heart doctor, had pre transplant stress test at [facility]    . Diabetes mellitus     Borderline  . End stage renal disease on dialysis     Secondary to hypertension // T/Th/Sat dialysis on [employer]  . Hepatitis C   . Colitis, ischemic 01/2011    S/P partial colectomy of right hemicolon and ileostomy placement  . Lumbar spinal stenosis      bilateral L4-5 and L5-S1 posterior lumbar interbody arthrodesis and bilateral L4-S1 posterior lateral arthrodesis for lumbar stenosis dynamic lumbar spondylolisthesis lumbar spondylosis lumbar radiculopathy with foot drop // s/p surgery 01/2011 - see surgery section for details.   Past Surgical History:  Past Surgical History  Procedure Date  . Nephrectomy 2010    right side done at [facility] on transplant list  . Umbilical hernia repair age 75- 44  . Appendectomy 1960's  . Vascular surgery     right  arm dialysis graft  . Tonsillectomy     as a child  . Rib resection     d/t kidney removal  . Back surgery 01/30/2011    L4-S1 lumbar laminectomy facetectomy and foraminotomies for decompression a bilateral L4-5 and L5-S1 posterior lumbar interbody arthrodesis and bilateral L4-S1 posterior lateral arthrodesis for lumbar stenosis dynamic lumbar spondylolisthesis lumbar spondylosis lumbar radiculopathy with foot drop  . Hernia repair   . Laparotomy 02/09/2011    Procedure: EXPLORATORY LAPAROTOMY;  Surgeon: Harl Bowie, MD;  Location: [place];  Service: General;  Laterality: N/A;  . Partial colectomy 02/09/2011    Reason for surgery: ischemic colitis of right hemicolon; Surgeon: Harl Bowie, MD;  Location: [place];  Service: General;  Laterality: Right;  . Colostomy 02/09/2011    Reason for surgery: ischemic colitis of right hemicolon requiring partial colectomy and ileostomy.  Surgeon: Harl Bowie, MD;  Location: [place];  Service: General;  Laterality: Right;  . Colonoscopy 02/19/2011    Procedure: COLONOSCOPY;  Surgeon: Missy Sabins, MD;  Location: [place];  Service: Endoscopy;  Laterality: N/A;  . Colonoscopy 03/07/2011    Procedure: COLONOSCOPY;  Surgeon: Cleotis Nipper, MD;  Location: [facility] ENDOSCOPY;  Service: Endoscopy;  Laterality: N/A;  no prep needed--pt has long Hartmann's pouch and ileostomy  .  Total parathyroidectomy 01/17/2001    with autotransplantation into left forearm // due to secondary hyperparathyroidism from ESRD    PT Assessment/Plan/Recommendation PT Assessment Clinical Impression Statement: Pt was nonambulatory at home prior to admission and appears to be functioning at her baseline level . Unlikely acute PT will make any significant improvement in pt's functional status.   PT Recommendation/Assessment: All further PT needs can be met in the next venue of care PT Problem List: Pain;Impaired sensation;Decreased strength;Decreased  coordination Barriers to Discharge: None PT Therapy Diagnosis : Other (comment) (paraplegia) PT Recommendation Follow Up Recommendations: Home health PT Equipment Recommended: None recommended by PT PT Goals     PT Evaluation Precautions/Restrictions  Precautions Precautions: Fall Prior Functioning  Home Living Lives With: Daughter Receives Help From: Family;Friend(s) Type of Home: House Home Layout: One level Home Access: Ramped entrance Bathroom Shower/Tub: Chiropodist: Standard Bathroom Accessibility: No Home Adaptive Equipment: Bedside commode/3-in-1;Wheelchair - manual;Walker - rolling Prior Function Level of Independence: Needs assistance with ADLs;Independent with transfers (Pt was non ambulatory prior to admission) Bath: Minimal Dressing: Minimal Able to Take Stairs?: No Driving: No Cognition Cognition Arousal/Alertness: Awake/alert Overall Cognitive Status: Impaired Orientation Level: Oriented to person;Oriented to place;Oriented to situation;Disoriented to time Sensation/Coordination Sensation Light Touch: Impaired Detail Light Touch Impaired Details: Impaired RLE;Impaired LLE Stereognosis: Not tested Hot/Cold: Not tested Proprioception: Impaired by gross assessment Additional Comments: UEs appear intact Coordination Gross Motor Movements are Fluid and Coordinated: No Fine Motor Movements are Fluid and Coordinated: Not tested Extremity Assessment RUE Assessment RUE Assessment: Within Functional Limits LUE Assessment LUE Assessment: Within Functional Limits RLE Assessment RLE Assessment: Exceptions to [facility] RLE PROM (degrees) Overall PROM Right Lower Extremity: Within functional limits for tasks assessed RLE Strength RLE Overall Strength: Deficits RLE Overall Strength Comments: foot drop noted, 3-/5 overall LLE Assessment LLE Assessment: Exceptions to WFL LLE PROM (degrees) Overall PROM Left Lower Extremity: Within functional  limits for tasks assessed LLE Strength LLE Overall Strength: Deficits LLE Overall Strength Comments: foot drop noted, overall 3-/5 Mobility (including Balance) Bed Mobility Bed Mobility: Yes Supine to Sit: 7: Independent Sit to Supine: 7: Independent Transfers Transfers: Yes Sit to Stand: Not tested (comment) Stand to Sit: Not tested (comment) Squat Pivot Transfers: 6: Modified independent (Device/Increase time) Ambulation/Gait Ambulation/Gait: No Stairs: No Wheelchair Mobility Wheelchair Mobility: Yes Wheelchair Assistance: Not tested (comment)  Balance Balance Assessed: Yes Static Sitting Balance Static Sitting - Level of Assistance: 7: Independent Dynamic Sitting Balance Dynamic Sitting - Level of Assistance: 7: Independent Static Standing Balance Static Standing - Level of Assistance: Not tested (comment) Dynamic Standing Balance Dynamic Standing - Level of Assistance: Not tested (comment) Exercise    End of Session PT - End of Session Activity Tolerance: Patient tolerated treatment well Patient left: in bed;with call bell in reach;with bed alarm set Nurse Communication: Mobility status for transfers General Behavior During Session: [facility] for tasks performed Cognition: [facility] for tasks performed  Taj Arteaga 04/04/2011, 6:41 PM Blandon Offerdahl L. Boniface Goffe DPT [phone]

## 2011-04-04 NOTE — Progress Notes (Signed)
PT DISCHARGE NOTE:  PT evaluation completed.  Pt is performing at/near baseline level of function and presents with no further acute PT needs.   Acute PT signing off.  Discussed plan with pt/family and they agree.    Dartanion Teo L. Nickholas [place] DPT [phone] 04/04/2011

## 2011-04-05 ENCOUNTER — Observation Stay (HOSPITAL_COMMUNITY): Payer: Medicare Other

## 2011-04-05 LAB — BASIC METABOLIC PANEL
BUN: 48 mg/dL — ABNORMAL HIGH (ref 6–23)
Chloride: 96 mEq/L (ref 96–112)
GFR calc Af Amer: 6 mL/min — ABNORMAL LOW (ref >90)
Glucose, Bld: 105 mg/dL — ABNORMAL HIGH (ref 70–99)
Potassium: 5.3 mEq/L — ABNORMAL HIGH (ref 3.5–5.1)

## 2011-04-05 LAB — CBC
HCT: 34.1 % — ABNORMAL LOW (ref 36.0–46.0)
Hemoglobin: 10.7 g/dL — ABNORMAL LOW (ref 12.0–15.0)
WBC: 12.9 10*3/uL — ABNORMAL HIGH (ref 4.0–10.5)

## 2011-04-05 LAB — ALBUMIN: Albumin: 3.1 g/dL — ABNORMAL LOW (ref 3.5–5.2)

## 2011-04-05 MED ORDER — HEPARIN SODIUM (PORCINE) 1000 UNIT/ML IJ SOLN
INTRAMUSCULAR | Status: DC | PRN
  Administered 2011-04-05: 3000 [IU] via INTRAVENOUS

## 2011-04-05 MED ORDER — HEPARIN SODIUM (PORCINE) 1000 UNIT/ML IJ SOLN
INTRAMUSCULAR | Status: AC
  Filled 2011-04-05: qty 1

## 2011-04-05 MED ORDER — FENTANYL CITRATE 0.05 MG/ML IJ SOLN
INTRAMUSCULAR | Status: DC | PRN
  Administered 2011-04-05 (×3): 25 ug via INTRAVENOUS

## 2011-04-05 MED ORDER — MIDAZOLAM HCL 5 MG/5ML IJ SOLN
INTRAMUSCULAR | Status: DC | PRN
  Administered 2011-04-05: 1 mg via INTRAVENOUS
  Administered 2011-04-05 (×2): 0.5 mg via INTRAVENOUS

## 2011-04-05 MED ORDER — RENA-VITE PO TABS
1.0000 | ORAL_TABLET | Freq: Every day | ORAL | Status: DC
  Administered 2011-04-05 – 2011-04-09 (×4): 1 via ORAL
  Filled 2011-04-05 (×6): qty 1

## 2011-04-05 MED ORDER — SODIUM CHLORIDE 0.9 % IV SOLN
INTRAVENOUS | Status: DC | PRN
  Administered 2011-04-05: 50 mL/h via INTRAVENOUS
  Administered 2011-04-08: 20 mL via INTRAVENOUS

## 2011-04-05 MED ORDER — IOHEXOL 300 MG/ML  SOLN
100.0000 mL | Freq: Once | INTRAMUSCULAR | Status: AC | PRN
  Administered 2011-04-05: 60 mL via INTRAVENOUS

## 2011-04-05 MED ORDER — METHYLPREDNISOLONE SODIUM SUCC 125 MG IJ SOLR
INTRAMUSCULAR | Status: AC
  Filled 2011-04-05: qty 2

## 2011-04-05 MED ORDER — METHYLPREDNISOLONE SODIUM SUCC 125 MG IJ SOLR
60.0000 mg | Freq: Once | INTRAMUSCULAR | Status: AC
  Administered 2011-04-05: 60 mg via INTRAVENOUS

## 2011-04-05 MED ORDER — ALTEPLASE 100 MG IV SOLR
INTRAVENOUS | Status: DC | PRN
  Administered 2011-04-05: 2 mg

## 2011-04-05 MED ORDER — ALTEPLASE 2 MG IJ SOLR
2.0000 mg | INTRAMUSCULAR | Status: AC
  Filled 2011-04-05: qty 2

## 2011-04-05 NOTE — ED Notes (Signed)
Pt alert to person place. Confused at date and time pt states that she is confused. Pt daughter signed consent

## 2011-04-05 NOTE — Progress Notes (Signed)
Subjective:  No complaints, waiting for declot of RUA AVG.  Objective: Vital signs in last 24 hours: Temp:  [97.4 F (36.3 C)-98.6 F (37 C)] 98.1 F (36.7 C) (01/18 0538) Pulse Rate:  [94-104] 102  (01/18 0538) Resp:  [18] 18  (01/18 0538) BP: (129-144)/(59-88) 131/88 mmHg (01/18 0538) SpO2:  [93 %-98 %] 93 % (01/18 0538) Weight change:   Intake/Output from previous day: 01/17 0701 - 01/18 0700 In: 720 [P.O.:720] Out: 550 [Stool:550]   EXAM: General appearance:  Alert and oriented x 2 (person, place), comfortable Resp:  CTA bilaterally Cardio:  RRR with Gr II/IV systolic murmur GI:  RLQ ileostomy, soft and nontender Extremities:  No edema Access:  AVG @ RUA with no thrill or bruit  Lab Results:  Basename 04/04/11 0640 04/03/11 1716  WBC 7.6 8.8  HGB 11.1* 10.8*  HCT 36.0 35.5*  PLT 338 323   BMET:  Basename 04/04/11 0640 04/03/11 1716  NA 140 135  K 4.6 4.7  CL 96 91*  CO2 31 31  GLUCOSE 78 83  BUN 41* 38*  CREATININE 6.68* 5.62*  CALCIUM 11.4* 10.9*  ALBUMIN 3.1* 3.1*   No results found for this basename: PTH:2 in the last 72 hours Iron Studies: No results found for this basename: IRON,TIBC,TRANSFERRIN,FERRITIN in the last 72 hours  Assessment/Plan: 1.  AMS - believed to be secondary to medications (Neurontin, Oxycodone, also on Cipro at time of admission) or hypercalcemia (11.4 yesterday); afebrile. 2.  ESRD - on HD on TTS, but missed yesterday secondary to clotted AVG; K 4.6 yesterday.  HD pending after declot of access per Radiology. 3.  Vascular access - RUA AVG clotted.  Contrast dye allergy so declot pending today following premedication. 4.  Anemia - Hgb 11.1 stable on max Epogen and Fe as outpatient, Aranesp 100 mcg pending HD on 1/19 (Sat). 5.  HTN/Volume - BP fairly well controlled, no signs or symptoms of fluid overload. 6.  Secondary hyperparathyroidism - Ca 11.4, P 3.4, Calcium-based binders held, no Zemplar. 7.  Ischemic colitis - s/p right  hemicolectomy and ileostomy.     LOS: 2 days   [name],CHARLES 04/05/2011,7:11 AM  I have seen and examined this patient and agree with plan .  For declot today then HD.  Will plan HD tomorrow as well to get on schedule. Darion Milewski T,MD 04/05/2011 10:12 AM

## 2011-04-05 NOTE — Procedures (Signed)
Post-Procedure Note  Pre-operative Diagnosis: ESRD       Post-operative Diagnosis:ESRD   Indications: Occluded right upper arm graft  Procedure Details:   Declot of the RUE graft.  Balloon angioplasty with 6 mm and 7 mm balloons.  Findings: Large aneurysms along graft.  Restored flow in graft but there is residual clot in the aneurysms.  Stenosis at venous anastomosis is elastic.    Complications: None     Condition: good Plan: Attempt to use dialysis graft.  If graft occludes again, recommend surgical consultation.

## 2011-04-05 NOTE — Progress Notes (Signed)
Internal Medicine Teaching Service Attending Note Date: 04/05/2011  Patient name: [name]  Medical record number: [id number]  Date of birth: 1950-08-27    This patient has been seen and discussed with the house staff. Please see their note for complete details. I concur with their findings with the following additions/corrections: Ms Proctor was seen on AM rounds. To get HD access today and then HD. AMS seems resolved and likely 2/2 meds.   Keil Pickering 04/05/2011, 4:24 PM

## 2011-04-05 NOTE — Progress Notes (Signed)
Order received, chart reviewed, noted that pt non-ambulatory pta and pt was I/Mod I with PT yesterday (04/04/2011) for bed mobility and squat pivot transfer. Tried to call and speak with daughter pt lives with--no answer. Called other daughter, Elmyra Ricks to find out PLOF since the pt's answers were either vague or she did not answer them. Per Elmyra Ricks the pt received A for dressing at bed level (supine and sitting EOB), A for showering seated on tub seat/bench, and pt has A for her colostomy back and does not urinate secondary to dialysis. Pt appears to be at the same level as pta--no OT needs identified, acute OT will sign off. Spoke with nurse making her aware that PT had signed off yesterday and OT today, so we will not be back to A pt up OOB for the rest of her stay here, thus floor staff will need to do this.  [place], [place] [phone] 04/05/2011

## 2011-04-05 NOTE — Progress Notes (Signed)
Subjective: No acute event overnight Objective: Vital signs in last 24 hours: Filed Vitals:   04/04/11 1813 04/04/11 1814 04/04/11 2145 04/05/11 0538  BP: 135/59 138/83 129/87 131/88  Pulse: 100 100 94 102  Temp: 98.6 F (37 C) 97.4 F (36.3 C) 98.6 F (37 C) 98.1 F (36.7 C)  TempSrc: Oral Oral Oral Oral  Resp: 18 18 18 18   Height:      Weight:      SpO2: 97% 98% 94% 93%   Weight change:   Intake/Output Summary (Last 24 hours) at 04/05/11 0905 Last data filed at 04/05/11 0300  Gross per 24 hour  Intake    720 ml  Output    550 ml  Net    170 ml   Physical Exam: General: NAD  Lungs: Normal respiratory effort. Clear to auscultation BL without crackles or wheezes.  Heart: RRR. 4/6 systolic murmur auscultated throughout precordium, does not radiate to carotids.  Abdomen: BS normoactive. Soft, Nondistended, non-tender. No masses or organomegaly. Iliostomy present in RLQ with yellow output. No surrounding erythema or Exudate. Clean, dry, intact.  Extremities: No pretibial edema. Renal access on R upper arm flow: no thrills heard.  Neurologic: nonfocal   Lab Results: Basic Metabolic Panel:  Lab 0000000 0640 04/03/11 1716  NA 140 135  K 4.6 4.7  CL 96 91*  CO2 31 31  GLUCOSE 78 83  BUN 41* 38*  CREATININE 6.68* 5.62*  CALCIUM 11.4* 10.9*  MG -- --  PHOS 3.4 --   Liver Function Tests:  Lab 04/04/11 0640 04/03/11 1716  AST -- 31  ALT -- 15  ALKPHOS -- 119*  BILITOT -- 0.3  PROT -- 8.4*  ALBUMIN 3.1* 3.1*   Lab 04/03/11 1717  AMMONIA 17   CBC:  Lab 04/04/11 0640 04/03/11 1716  WBC 7.6 8.8  NEUTROABS 3.5 4.1  HGB 11.1* 10.8*  HCT 36.0 35.5*  MCV 89.8 92.0  PLT 338 323   Cardiac Enzymes:  Lab 04/03/11 1721  CKTOTAL --  CKMB --  CKMBINDEX --  TROPONINI <0.30   BNP:  Lab 04/03/11 1720  PROBNP 2300.0*   Hemoglobin A1C:  Lab 04/04/11 0535  HGBA1C 4.3   Thyroid Function Tests:  Lab 04/04/11 0535  TSH 1.091  T4TOTAL --  FREET4 --    T3FREE --  THYROIDAB --   Coagulation:  Lab 04/03/11 1716  LABPROT 13.9  INR 1.05   Anemia Panel: No results found for this basename: VITAMINB12,FOLATE,FERRITIN,TIBC,IRON,RETICCTPCT in the last 168 hours  Micro Results: Recent Results (from the past 240 hour(s))  MRSA PCR SCREENING     Status: Normal   Collection Time   04/03/11 10:38 PM      Component Value Range Status Comment   MRSA by PCR NEGATIVE  NEGATIVE  Final   CLOSTRIDIUM DIFFICILE BY PCR     Status: Normal   Collection Time   04/04/11 12:04 AM      Component Value Range Status Comment   C difficile by pcr NEGATIVE  NEGATIVE  Final    Studies/Results: Dg Chest 2 View  04/03/2011  *RADIOLOGY REPORT*  Clinical Data: Altered level of consciousness.  CHEST - 2 VIEW  Comparison: 02/15/2011  Findings: Cardiac enlargement.  COPD.  Negative for heart failure. Mild bibasilar scarring.  Negative for pneumonia or effusion.  IMPRESSION: COPD with bibasilar scarring.  No acute cardiopulmonary disease.  Original Report Authenticated By: Truett Perna, M.D.   Ct Head Wo Contrast  04/03/2011  *  RADIOLOGY REPORT*  Clinical Data: Confusion, fatigue, hypotension  CT HEAD WITHOUT CONTRAST  Technique:  Contiguous axial images were obtained from the base of the skull through the vertex without contrast.  Comparison: None.  Findings: Brain atrophy evident with periventricular chronic microvascular ischemic changes diffusely.  Slight ventricular enlargement.  No acute intracranial hemorrhage, definite infarction, mass lesion, midline shift, herniation, or extra-axial fluid collection.  Cisterns patent.  Cerebellar atrophy as well. Symmetric orbits.  Mastoids and sinuses clear.  IMPRESSION: Atrophy and microvascular chronic ischemic changes.  No acute intracranial finding.  Original Report Authenticated By: Jerilynn Mages. Daryll Brod, M.D.   Medications: Scheduled Meds:   . ciprofloxacin  500 mg Oral Q breakfast  . darbepoetin  100 mcg Intravenous Q Sat-HD   . diphenhydrAMINE  50 mg Oral Once  . ferric gluconate (FERRLECIT/NULECIT) IV  125 mg Intravenous Once  . heparin  5,000 Units Subcutaneous Q8H  . multivitamin  1 tablet Oral Daily  . pantoprazole  40 mg Oral Q1200  . predniSONE  50 mg Oral Q6H  . sodium chloride  3 mL Intravenous Q12H  . sodium polystyrene  30 g Oral Once  . traMADol  50 mg Oral Q6H  . DISCONTD: darbepoetin  200 mcg Intravenous Q7 days  . DISCONTD: darbepoetin  200 mcg Intravenous Q Sat-HD  . DISCONTD: traMADol  50 mg Oral Once   Continuous Infusions:  PRN Meds:.sodium chloride, sodium chloride, sodium chloride, acetaminophen, acetaminophen, alteplase, camphor-menthol, docusate sodium, feeding supplement (NEPRO CARB STEADY), heparin, hydrOXYzine, lidocaine, lidocaine-prilocaine, ondansetron (ZOFRAN) IV, ondansetron, pentafluoroprop-tetrafluoroeth, sodium chloride, sorbitol, zolpidem, DISCONTD: calcium carbonate (dosed in mg elemental calcium) Assessment/Plan: ##: Altered mental status -Resolved seems back to her baseline, A&O x2.     ##: ESRD on HD (Tues, Thurs, Sat on [employer]) - Will get declot today with radiology. Appreciate Renal's input.  ##: Question of borderline diabetes - HbA1C : 4.3  ##: Sinus tachycardia - chronic at least since admission in 01/2011. Patient has remained in sinus tach with rates in low 100's.  - TSH : 1.09 wnl - Monitor on Telemetry  - HD will monitor fluid status closely   ##: Anemia of chronic disease - BL Hgb 8-10. Currently stable.  - Continue Aranesp per renal.   ##: History of ischemic colitis - stable.  s/p partial colectomy of right hemicolon with ileostomy per Dr. Ninfa [place]. Patient continues to be on Cipro therapy until follow-up with Dr. Ninfa [place]. Ileostomy stump looks healthy without surrounding maceration of skin or friability.  - C. Diff negative today  - Wound care consult for continued ostomy management  - Continue ciprofloxacin once daily   ##: Lumbar stenosis  s/p lumbar disc surgery in 11/12 - Wound has healed well. Patient denies back pain. Reports neuropathic leg and foot pain.  - Continue to hold percocet.  - Tylenol for pain.   ##: DVT PPx - SQ heparin   LOS: 2 days   Geneve Kimpel 04/05/2011, 9:05 AM

## 2011-04-06 ENCOUNTER — Observation Stay (HOSPITAL_COMMUNITY): Payer: Medicare Other

## 2011-04-06 LAB — RENAL FUNCTION PANEL
CO2: 26 mEq/L (ref 19–32)
Chloride: 94 mEq/L — ABNORMAL LOW (ref 96–112)
Creatinine, Ser: 5.46 mg/dL — ABNORMAL HIGH (ref 0.50–1.10)
GFR calc non Af Amer: 8 mL/min — ABNORMAL LOW (ref >90)

## 2011-04-06 LAB — CBC
HCT: 37.7 % (ref 36.0–46.0)
Hemoglobin: 11.9 g/dL — ABNORMAL LOW (ref 12.0–15.0)
MCH: 27.8 pg (ref 26.0–34.0)
MCHC: 31.6 g/dL (ref 30.0–36.0)
RDW: 18.2 % — ABNORMAL HIGH (ref 11.5–15.5)

## 2011-04-06 MED ORDER — DARBEPOETIN ALFA-POLYSORBATE 100 MCG/0.5ML IJ SOLN
INTRAMUSCULAR | Status: AC
  Administered 2011-04-06: 20:00:00
  Filled 2011-04-06: qty 0.5

## 2011-04-06 MED ORDER — GI COCKTAIL ~~LOC~~
30.0000 mL | Freq: Two times a day (BID) | ORAL | Status: DC | PRN
  Administered 2011-04-06 (×2): 30 mL via ORAL
  Filled 2011-04-06 (×2): qty 30

## 2011-04-06 MED ORDER — WHITE PETROLATUM GEL
Status: AC
  Administered 2011-04-06: 08:00:00
  Filled 2011-04-06: qty 5

## 2011-04-06 NOTE — Progress Notes (Signed)
Subjective:  No complaints, resting comfortably  Objective: Vital signs in last 24 hours: Temp:  [98.1 F (36.7 C)-98.6 F (37 C)] 98.6 F (37 C) (01/19 0453) Pulse Rate:  [101-138] 130  (01/19 0453) Resp:  [10-29] 18  (01/19 0453) BP: (117-160)/(83-107) 160/107 mmHg (01/19 0453) SpO2:  [90 %-96 %] 95 % (01/19 0453) Weight:  [46.5 kg (102 lb 8.2 oz)-48.8 kg (107 lb 9.4 oz)] 46.539 kg (102 lb 9.6 oz) (01/18 2113) Weight change:   Intake/Output from previous day: 01/18 0701 - 01/19 0700 In: 123 [P.O.:120; I.V.:3] Out: 2500    EXAM: General appearance: Alert, oriented x 2 (person, place) Resp:  CTA bilaterally Cardio:  RRR with Gr II/IV systolic murmur GI:  RLQ ileostomy, soft and nontender  Extremities:  No edema Access:  AVG @ RUA  Lab Results:  Basename 04/06/11 0600 04/05/11 1720  WBC 8.7 12.9*  HGB 11.9* 10.7*  HCT 37.7 34.1*  PLT 345 343   BMET:  Basename 04/05/11 1720 04/04/11 0640  NA 139 140  K 5.3* 4.6  CL 96 96  CO2 27 31  GLUCOSE 105* 78  BUN 48* 41*  CREATININE 7.71* 6.68*  CALCIUM 9.9 11.4*  ALBUMIN 3.1* 3.1*   No results found for this basename: PTH:2 in the last 72 hours Iron Studies: No results found for this basename: IRON,TIBC,TRANSFERRIN,FERRITIN in the last 72 hours  Assessment/Plan: 1.  AMS  - believed to be secondary to medications (Neurontin, Oxycodone, also on Cipro at time of admission) or hypercalcemia (11.4 on 1/17, 9.9 today s/p HD on 2Ca); afebrile. 2.  ESRD - on HD on TTS, s/p HD yesterday after declot of AVG per Radiology, K 5.3 pre-HD yesterday.  Will resume regular HD schedule today. 3.  Vascular access - s/p declot of AVG yesterday, delayed for premedication for contrast dye allergy; used yesterday. 4.  Anemia - Hgb 11.9 on Aranesp 100 mcg, pending today. 5. HTN/Volume - BP fairly well controlled, no signs or symptoms of fluid overload.  6. Secondary hyperparathyroidism - Ca 9.9, P 3.4, Calcium-based binders held, no Zemplar.    7. Ischemic colitis - s/p right hemicolectomy and ileostomy.     LOS: 3 days   [name],CHARLES 04/06/2011,7:21 AM  Patient seen and examined and agree with assessment and plan as above.   Kelly Splinter, MD [employer] [phone] cell 04/06/2011, 4:51 PM

## 2011-04-06 NOTE — Progress Notes (Signed)
Subjective: No complaints this morning beside mild abdominal pain earlier.  She stated that after she ate crackers and drank gingerale, she vomited clear "stuff" up but now pain is resolved. Objective: Vital signs in last 24 hours: Filed Vitals:   04/05/11 2015 04/05/11 2030 04/05/11 2113 04/06/11 0453  BP: 130/84  140/95 160/107  Pulse: 124  122 130  Temp: 98.6 F (37 C)  98.1 F (36.7 C) 98.6 F (37 C)  TempSrc: Oral  Oral Oral  Resp: 14  16 18   Height:   5\' 5"  (1.651 m)   Weight:  102 lb 8.2 oz (46.5 kg) 102 lb 9.6 oz (46.539 kg)   SpO2: 95%  93% 95%   Weight change:   Intake/Output Summary (Last 24 hours) at 04/06/11 1111 Last data filed at 04/06/11 [id number]  Gross per 24 hour  Intake      3 ml  Output   2500 ml  Net  -2497 ml   Physical Exam: GEN: NAD, sitting up in bed talking to her husband Lungs: Normal respiratory effort. Clear to auscultation BL without crackles or wheezes.  Heart: tachycardic. 4/6 systolic murmur auscultated throughout precordium, does not radiate to carotids.  Abdomen: BS normoactive. Soft, Nondistended, non-tender to palpation. No masses or organomegaly. Iliostomy present in RLQ with yellow output. No surrounding erythema or Exudate. Clean, dry, intact.  Extremities: No pretibial edema. Renal access on R upper arm flow: + good thrills with auscultation.  Neurologic: nonfocal  Lab Results: Basic Metabolic Panel:  Lab [id number] 0600 04/05/11 1720  NA 139 139  K 4.6 5.3*  CL 94* 96  CO2 26 27  GLUCOSE 106* 105*  BUN 31* 48*  CREATININE 5.46* 7.71*  CALCIUM 9.8 9.9  MG -- --  PHOS 4.5 3.3   Liver Function Tests:  Lab 04/06/11 0600 04/05/11 1720 04/03/11 1716  AST -- -- 31  ALT -- -- 15  ALKPHOS -- -- 119*  BILITOT -- -- 0.3  PROT -- -- 8.4*  ALBUMIN 3.4* 3.1* --   No results found for this basename: LIPASE:2,AMYLASE:2 in the last 168 hours  Lab 04/03/11 1717  AMMONIA 17   CBC:  Lab 04/06/11 0600 04/05/11 1720 04/04/11 0640  04/03/11 1716  WBC 8.7 12.9* -- --  NEUTROABS -- -- 3.5 4.1  HGB 11.9* 10.7* -- --  HCT 37.7 34.1* -- --  MCV 88.1 87.9 -- --  PLT 345 343 -- --   Cardiac Enzymes:  Lab 04/03/11 1721  CKTOTAL --  CKMB --  CKMBINDEX --  TROPONINI <0.30   BNP:  Lab 04/03/11 1720  PROBNP 2300.0*   D-Dimer: No results found for this basename: DDIMER:2 in the last 168 hours CBG: No results found for this basename: GLUCAP:6 in the last 168 hours Hemoglobin A1C:  Lab 04/04/11 0535  HGBA1C 4.3   Thyroid Function Tests:  Lab 04/04/11 0535  TSH 1.091  T4TOTAL --  FREET4 --  T3FREE --  THYROIDAB --   Coagulation:  Lab 04/03/11 1716  LABPROT 13.9  INR 1.05    Micro Results: Recent Results (from the past 240 hour(s))  MRSA PCR SCREENING     Status: Normal   Collection Time   04/03/11 10:38 PM      Component Value Range Status Comment   MRSA by PCR NEGATIVE  NEGATIVE  Final   CLOSTRIDIUM DIFFICILE BY PCR     Status: Normal   Collection Time   04/04/11 12:04 AM  Component Value Range Status Comment   C difficile by pcr NEGATIVE  NEGATIVE  Final    Studies/Results: Ir Pta Venous Right  04/05/2011  *RADIOLOGY REPORT*  Clinical history:End-stage renal disease and clotted right upper extremity graft  PROCEDURE(S): DIALYSIS GRAFT DECLOT; GRAFT/VENOUS PTA; ULTRASOUND GUIDANCE FOR VASCULAR ACCESS  Physician: Stephan Minister. Henn, MD  Medications:Heparin 3000 units, TPA 2 mg, Versed 2 mg, Fentanyl75 mcg. A radiology nurse monitored the patient for moderate sedation.  Moderate sedation time:65 minutes  Fluoroscopy time: 14.4 minutes  Contrast: 60 ml Omnipaque 300  Procedure:Informed consent was obtained for a declot procedure. The right upper arm was prepped and draped in a sterile fashion. Maximal barrier sterile technique was utilized including caps, mask, sterile gowns, sterile gloves, sterile drape, hand hygiene and skin antiseptic.  The skin was anesthetized with 1% lidocaine. 21 gauge needle was  advanced into the lower aspect of the graft with ultrasound guidance.  Micropuncture dilator set was placed.  A Bentson wire was advanced into the outflow vein and a 6-French vascular sheath was placed.  5-French Kumpe catheter was advanced in the central veins and a central venogram was performed.  2 mg of TPA was placed within the graft using the 5-French Kumpe catheter. The graft was treated with the Argon Cleaner thrombectomy device. The graft was accessed towards arterial anastomosis using a 21 gauge needle with ultrasound guidance.  A 6-French vascular sheath was placed with a Bentson wire.  It was very difficult to advance a catheter or wire through graft aneurysms.  Eventually, the Kumpe catheter was advanced into the arterial system and the arterial plug was pulled using a Fogarty balloon.  There was flow within the graft.  The venous anastomosis was angioplastied to 6 mm x 40 mm Conquest balloon.  The follow-up shuntogram images were obtained. The venous outflow vein was treated again with a 7 mm x 40 mm Conquest balloon.  Residual clot within the graft was treated with the thrombectomy device.  Vascular sheaths removed with pursestring sutures.  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  Stenosis in the right axillary region appears to be elastic. Residual narrowing in the right axillary region despite balloon dilatation.  At the end of the procedure, there was flow in the graft but residual thrombus involving the aneurysms.  Impression:Successful declot of the right upperextremity graft.  Access management:  There are large aneurysms involving the right upper arm graft that make it very difficult to treat percutaneously.  Recommend surgical consultation if the graft occludes in the future.  Original Report Authenticated By: Markus Daft, M.D.   Ir Av Dialysis Graft Declot  04/05/2011  *RADIOLOGY REPORT*  Clinical history:End-stage renal  disease and clotted right upper extremity graft  PROCEDURE(S): DIALYSIS GRAFT DECLOT; GRAFT/VENOUS PTA; ULTRASOUND GUIDANCE FOR VASCULAR ACCESS  Physician: Stephan Minister. Henn, MD  Medications:Heparin 3000 units, TPA 2 mg, Versed 2 mg, Fentanyl75 mcg. A radiology nurse monitored the patient for moderate sedation.  Moderate sedation time:65 minutes  Fluoroscopy time: 14.4 minutes  Contrast: 60 ml Omnipaque 300  Procedure:Informed consent was obtained for a declot procedure. The right upper arm was prepped and draped in a sterile fashion. Maximal barrier sterile technique was utilized including caps, mask, sterile gowns, sterile gloves, sterile drape, hand hygiene and skin antiseptic.  The skin was anesthetized with 1% lidocaine. 21 gauge needle was advanced into the lower aspect of the graft with ultrasound guidance.  Micropuncture dilator set was placed.  A Bentson wire was advanced into the outflow vein and a 6-French vascular sheath was placed.  5-French Kumpe catheter was advanced in the central veins and a central venogram was performed.  2 mg of TPA was placed within the graft using the 5-French Kumpe catheter. The graft was treated with the Argon Cleaner thrombectomy device. The graft was accessed towards arterial anastomosis using a 21 gauge needle with ultrasound guidance.  A 6-French vascular sheath was placed with a Bentson wire.  It was very difficult to advance a catheter or wire through graft aneurysms.  Eventually, the Kumpe catheter was advanced into the arterial system and the arterial plug was pulled using a Fogarty balloon.  There was flow within the graft.  The venous anastomosis was angioplastied to 6 mm x 40 mm Conquest balloon.  The follow-up shuntogram images were obtained. The venous outflow vein was treated again with a 7 mm x 40 mm Conquest balloon.  Residual clot within the graft was treated with the thrombectomy device.  Vascular sheaths removed with pursestring sutures.  Findings:Two large  aneurysms involving the upper arm graft. Ultrasound confirmed thrombus-filled aneurysms.  Stenosis in the right axillary region appears to be elastic. Residual narrowing in the right axillary region despite balloon dilatation.  At the end of the procedure, there was flow in the graft but residual thrombus involving the aneurysms.  Impression:Successful declot of the right upperextremity graft.  Access management:  There are large aneurysms involving the right upper arm graft that make it very difficult to treat percutaneously.  Recommend surgical consultation if the graft occludes in the future.  Original Report Authenticated By: Markus Daft, M.D.   Ir Angio Av Shunt Addl Access  04/05/2011  *RADIOLOGY REPORT*  Clinical history:End-stage renal disease and clotted right upper extremity graft  PROCEDURE(S): DIALYSIS GRAFT DECLOT; GRAFT/VENOUS PTA; ULTRASOUND GUIDANCE FOR VASCULAR ACCESS  Physician: Stephan Minister. Henn, MD  Medications:Heparin 3000 units, TPA 2 mg, Versed 2 mg, Fentanyl75 mcg. A radiology nurse monitored the patient for moderate sedation.  Moderate sedation time:65 minutes  Fluoroscopy time: 14.4 minutes  Contrast: 60 ml Omnipaque 300  Procedure:Informed consent was obtained for a declot procedure. The right upper arm was prepped and draped in a sterile fashion. Maximal barrier sterile technique was utilized including caps, mask, sterile gowns, sterile gloves, sterile drape, hand hygiene and skin antiseptic.  The skin was anesthetized with 1% lidocaine. 21 gauge needle was advanced into the lower aspect of the graft with ultrasound guidance.  Micropuncture dilator set was placed.  A Bentson wire was advanced into the outflow vein and a 6-French vascular sheath was placed.  5-French Kumpe catheter was advanced in the central veins and a central venogram was performed.  2 mg of TPA was placed within the graft using the 5-French Kumpe catheter. The graft was treated with the Argon Cleaner thrombectomy device.  The graft was accessed towards arterial anastomosis using a 21 gauge needle with ultrasound guidance.  A 6-French vascular sheath was placed with a Bentson wire.  It was very difficult to advance a catheter or wire through graft aneurysms.  Eventually, the Kumpe catheter was advanced into the arterial system and the arterial plug was pulled using a Fogarty balloon.  There was flow within the graft.  The venous anastomosis was angioplastied to 6 mm x 40 mm Conquest balloon.  The follow-up shuntogram images were obtained. The venous outflow vein was treated again with a 7 mm x 40 mm Conquest balloon.  Residual clot  within the graft was treated with the thrombectomy device.  Vascular sheaths removed with pursestring sutures.  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  Stenosis in the right axillary region appears to be elastic. Residual narrowing in the right axillary region despite balloon dilatation.  At the end of the procedure, there was flow in the graft but residual thrombus involving the aneurysms.  Impression:Successful declot of the right upperextremity graft.  Access management:  There are large aneurysms involving the right upper arm graft that make it very difficult to treat percutaneously.  Recommend surgical consultation if the graft occludes in the future.  Original Report Authenticated By: Markus Daft, M.D.   Ir US Guide Vasc Access Right  04/05/2011  *RADIOLOGY REPORT*  Clinical history:End-stage renal disease and clotted right upper extremity graft  PROCEDURE(S): DIALYSIS GRAFT DECLOT; GRAFT/VENOUS PTA; ULTRASOUND GUIDANCE FOR VASCULAR ACCESS  Physician: Stephan Minister. Henn, MD  Medications:Heparin 3000 units, TPA 2 mg, Versed 2 mg, Fentanyl75 mcg. A radiology nurse monitored the patient for moderate sedation.  Moderate sedation time:65 minutes  Fluoroscopy time: 14.4 minutes  Contrast: 60 ml Omnipaque 300  Procedure:Informed consent was  obtained for a declot procedure. The right upper arm was prepped and draped in a sterile fashion. Maximal barrier sterile technique was utilized including caps, mask, sterile gowns, sterile gloves, sterile drape, hand hygiene and skin antiseptic.  The skin was anesthetized with 1% lidocaine. 21 gauge needle was advanced into the lower aspect of the graft with ultrasound guidance.  Micropuncture dilator set was placed.  A Bentson wire was advanced into the outflow vein and a 6-French vascular sheath was placed.  5-French Kumpe catheter was advanced in the central veins and a central venogram was performed.  2 mg of TPA was placed within the graft using the 5-French Kumpe catheter. The graft was treated with the Argon Cleaner thrombectomy device. The graft was accessed towards arterial anastomosis using a 21 gauge needle with ultrasound guidance.  A 6-French vascular sheath was placed with a Bentson wire.  It was very difficult to advance a catheter or wire through graft aneurysms.  Eventually, the Kumpe catheter was advanced into the arterial system and the arterial plug was pulled using a Fogarty balloon.  There was flow within the graft.  The venous anastomosis was angioplastied to 6 mm x 40 mm Conquest balloon.  The follow-up shuntogram images were obtained. The venous outflow vein was treated again with a 7 mm x 40 mm Conquest balloon.  Residual clot within the graft was treated with the thrombectomy device.  Vascular sheaths removed with pursestring sutures.  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  Stenosis in the right axillary region appears to be elastic. Residual narrowing in the right axillary region despite balloon dilatation.  At the end of the procedure, there was flow in the graft but residual thrombus involving the aneurysms.  Impression:Successful declot of the right upperextremity graft.  Access management:  There are large  aneurysms involving the right upper arm graft that make it very difficult to treat percutaneously.  Recommend surgical consultation if the graft occludes in the future.  Original Report Authenticated By: Markus Daft, M.D.   Medications: Scheduled Meds:   . alteplase  2 mg Intracatheter to XRAY  . ciprofloxacin  500 mg Oral Q breakfast  . darbepoetin  100 mcg Intravenous Q Sat-HD  . heparin      . heparin  5,000 Units Subcutaneous  Q8H  . methylPREDNISolone (SOLU-MEDROL) injection  60 mg Intravenous Once  . multivitamin  1 tablet Oral QHS  . pantoprazole  40 mg Oral Q1200  . predniSONE  50 mg Oral Q6H  . sodium chloride  3 mL Intravenous Q12H  . traMADol  50 mg Oral Q6H  . white petrolatum      . DISCONTD: methylPREDNISolone sodium succinate      . DISCONTD: multivitamin  1 tablet Oral Daily   Continuous Infusions:   . sodium chloride 50 mL/hr (04/05/11 1400)   PRN Meds:.sodium chloride, sodium chloride, sodium chloride, sodium chloride, acetaminophen, acetaminophen, alteplase, camphor-menthol, docusate sodium, feeding supplement (NEPRO CARB STEADY), fentaNYL, heparin, heparin, hydrOXYzine, iohexol, lidocaine, lidocaine-prilocaine, midazolam, ondansetron (ZOFRAN) IV, ondansetron, pentafluoroprop-tetrafluoroeth, sodium chloride, sorbitol, zolpidem Assessment/Plan:  1. Altered mental status -Resolved seems back to her baseline, A&O x2.   2. ESRD on HD (Tues, Thurs, Sat on [employer]) -She had net negative 2.5L yesterday. Had declot yesterday w/o any complications.  Will get HD today Appreciate Renal's input.   3. abdominal pain- likely GERD, will continue protonix. GI cocktail PRN pain   4. Question of borderline diabetes - HbA1C : 4.3 , CBGs have been well controlled.  5. Sinus tachycardia - chronic at least since admission in 01/2011. Patient has remained in sinus tach with rates in 120-130's.  - TSH : 1.09 wnl  - Monitor on Telemetry  - HD will monitor fluid status  closely  6.Anemia of chronic disease - BL Hgb 8-10. Currently stable.  - Continue Aranesp per renal.   7. History of ischemic colitis - stable. s/p partial colectomy of right hemicolon with ileostomy per Dr. Ninfa Linden. Patient continues to be on Cipro therapy until follow-up with Dr. Ninfa Linden. Ileostomy stump looks healthy without surrounding maceration of skin or friability.  - C. Diff negative today  - Wound care consult for continued ostomy management  - Continue ciprofloxacin once daily   8. Lumbar stenosis s/p lumbar disc surgery in 11/12 - Wound has healed well. Patient denies back pain. Reports neuropathic leg and foot pain.  - Continue to hold percocet.  - Tylenol for pain.    DVT PPx - SQ heparin   LOS: 3 days   Fredonia Casalino 04/06/2011, 11:11 AM

## 2011-04-07 ENCOUNTER — Observation Stay (HOSPITAL_COMMUNITY): Payer: Medicare Other

## 2011-04-07 ENCOUNTER — Encounter (HOSPITAL_COMMUNITY): Payer: Self-pay | Admitting: Cardiology

## 2011-04-07 ENCOUNTER — Other Ambulatory Visit: Payer: Self-pay

## 2011-04-07 DIAGNOSIS — R000 Tachycardia, unspecified: Secondary | ICD-10-CM

## 2011-04-07 DIAGNOSIS — T82898A Other specified complication of vascular prosthetic devices, implants and grafts, initial encounter: Secondary | ICD-10-CM

## 2011-04-07 LAB — CBC
HCT: 39.8 % (ref 36.0–46.0)
MCHC: 31.2 g/dL (ref 30.0–36.0)
MCV: 89 fL (ref 78.0–100.0)
Platelets: 306 10*3/uL (ref 150–400)
RBC: 4.47 MIL/uL (ref 3.87–5.11)
WBC: 7.7 10*3/uL (ref 4.0–10.5)

## 2011-04-07 LAB — RENAL FUNCTION PANEL
Albumin: 3.5 g/dL (ref 3.5–5.2)
BUN: 33 mg/dL — ABNORMAL HIGH (ref 6–23)
CO2: 27 mEq/L (ref 19–32)
Chloride: 95 mEq/L — ABNORMAL LOW (ref 96–112)
Creatinine, Ser: 4.99 mg/dL — ABNORMAL HIGH (ref 0.50–1.10)
GFR calc Af Amer: 10 mL/min — ABNORMAL LOW (ref >90)
GFR calc non Af Amer: 9 mL/min — ABNORMAL LOW (ref >90)
Potassium: 4 mEq/L (ref 3.5–5.1)
Sodium: 137 mEq/L (ref 135–145)

## 2011-04-07 MED ORDER — DEXTROSE 5 % IV SOLN
1.5000 g | INTRAVENOUS | Status: AC
  Filled 2011-04-07 (×2): qty 1.5

## 2011-04-07 NOTE — Plan of Care (Signed)
Consent signed for surgery per patient's daughter.  Patient continue to have short term memory loss and some confusion.  Procedure explained to patient and daughter, voiced understanding.

## 2011-04-07 NOTE — Progress Notes (Signed)
Patient ID: [name], female   DOB: 1950/10/08, 61 y.o.   MRN: [id number] Vascular Surgery Progress Note  Subjective: Needs vascular access-thrombosis right upper arm AV G. with multiple aneurysms of graft Probable lysis performed by IR on 04/05/2010 with inability remove all thrombus from aneurysms  Objective:  Filed Vitals:   04/07/11 0909  BP: 134/88  Pulse: 133  Temp: 98.2 F (36.8 C)  Resp: 16    General well-developed well-nourished female-elderly in no apparent stress alert and oriented x3 Lungs no rhonchi or wheezing Cardiovascular rhythm no murmurs HEENT normal for age Neurologic normal Abdomen soft nontender with no masses Skin free of rashes Musculoskeletal-free of major deformities Upper extremity exam on the right reveals a functioning AV graft in the upper arm up to the axillary vein. 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 These are pulsatile and there is a thrill in the graft. No access has ever been performed in the left upper extremity. 2+ radial pulses palpable distally.  Labs:  Lab 04/07/11 0500 04/06/11 0600 04/05/11 1720  CREATININE 4.99* 5.46* 7.71*    Lab 04/07/11 0500 04/06/11 0600 04/05/11 1720  NA 137 139 139  K 4.0 4.6 5.3*  CL 95* 94* 96  CO2 27 26 27   BUN 33* 31* 48*  CREATININE 4.99* 5.46* 7.71*  LABGLOM -- -- --  GLUCOSE 109* -- --  CALCIUM 9.4 9.8 9.9    Lab 04/07/11 0500 04/06/11 0600 04/05/11 1720  WBC 7.7 8.7 12.9*  HGB 12.4 11.9* 10.7*  HCT 39.8 37.7 34.1*  PLT 306 345 343    Lab 04/03/11 1716  INR 1.05    I/O last 3 completed shifts: In: 293 [P.O.:290; I.V.:3] Out: 3912 [Other:3362; Stool:550]  Imaging: Ir Pta Venous Right  04/05/2011  *RADIOLOGY REPORT*  Clinical history:End-stage renal disease and clotted right upper extremity graft  PROCEDURE(S): DIALYSIS GRAFT DECLOT; GRAFT/VENOUS PTA; ULTRASOUND GUIDANCE FOR VASCULAR ACCESS  Physician: Stephan Minister. Henn, MD  Medications:Heparin  3000 units, TPA 2 mg, Versed 2 mg, Fentanyl75 mcg. A radiology nurse monitored the patient for moderate sedation.  Moderate sedation time:65 minutes  Fluoroscopy time: 14.4 minutes  Contrast: 60 ml Omnipaque 300  Procedure:Informed consent was obtained for a declot procedure. The right upper arm was prepped and draped in a sterile fashion. Maximal barrier sterile technique was utilized including caps, mask, sterile gowns, sterile gloves, sterile drape, hand hygiene and skin antiseptic.  The skin was anesthetized with 1% lidocaine. 21 gauge needle was advanced into the lower aspect of the graft with ultrasound guidance.  Micropuncture dilator set was placed.  A Bentson wire was advanced into the outflow vein and a 6-French vascular sheath was placed.  5-French Kumpe catheter was advanced in the central veins and a central venogram was performed.  2 mg of TPA was placed within the graft using the 5-French Kumpe catheter. The graft was treated with the Argon Cleaner thrombectomy device. The graft was accessed towards arterial anastomosis using a 21 gauge needle with ultrasound guidance.  A 6-French vascular sheath was placed with a Bentson wire.  It was very difficult to advance a catheter or wire through graft aneurysms.  Eventually, the Kumpe catheter was advanced into the arterial system and the arterial plug was pulled using a Fogarty balloon.  There was flow within the graft.  The venous anastomosis was angioplastied to 6 mm x 40 mm Conquest balloon.  The follow-up shuntogram images were obtained. The venous outflow vein was  treated again with a 7 mm x 40 mm Conquest balloon.  Residual clot within the graft was treated with the thrombectomy device.  Vascular sheaths removed with pursestring sutures.  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  Stenosis in the right axillary region appears to be elastic. Residual narrowing in the right axillary region  despite balloon dilatation.  At the end of the procedure, there was flow in the graft but residual thrombus involving the aneurysms.  Impression:Successful declot of the right upperextremity graft.  Access management:  There are large aneurysms involving the right upper arm graft that make it very difficult to treat percutaneously.  Recommend surgical consultation if the graft occludes in the future.  Original Report Authenticated By: Markus Daft, M.D.   Ir Av Dialysis Graft Declot  04/05/2011  *RADIOLOGY REPORT*  Clinical history:End-stage renal disease and clotted right upper extremity graft  PROCEDURE(S): DIALYSIS GRAFT DECLOT; GRAFT/VENOUS PTA; ULTRASOUND GUIDANCE FOR VASCULAR ACCESS  Physician: Stephan Minister. Henn, MD  Medications:Heparin 3000 units, TPA 2 mg, Versed 2 mg, Fentanyl75 mcg. A radiology nurse monitored the patient for moderate sedation.  Moderate sedation time:65 minutes  Fluoroscopy time: 14.4 minutes  Contrast: 60 ml Omnipaque 300  Procedure:Informed consent was obtained for a declot procedure. The right upper arm was prepped and draped in a sterile fashion. Maximal barrier sterile technique was utilized including caps, mask, sterile gowns, sterile gloves, sterile drape, hand hygiene and skin antiseptic.  The skin was anesthetized with 1% lidocaine. 21 gauge needle was advanced into the lower aspect of the graft with ultrasound guidance.  Micropuncture dilator set was placed.  A Bentson wire was advanced into the outflow vein and a 6-French vascular sheath was placed.  5-French Kumpe catheter was advanced in the central veins and a central venogram was performed.  2 mg of TPA was placed within the graft using the 5-French Kumpe catheter. The graft was treated with the Argon Cleaner thrombectomy device. The graft was accessed towards arterial anastomosis using a 21 gauge needle with ultrasound guidance.  A 6-French vascular sheath was placed with a Bentson wire.  It was very difficult to advance a  catheter or wire through graft aneurysms.  Eventually, the Kumpe catheter was advanced into the arterial system and the arterial plug was pulled using a Fogarty balloon.  There was flow within the graft.  The venous anastomosis was angioplastied to 6 mm x 40 mm Conquest balloon.  The follow-up shuntogram images were obtained. The venous outflow vein was treated again with a 7 mm x 40 mm Conquest balloon.  Residual clot within the graft was treated with the thrombectomy device.  Vascular sheaths removed with pursestring sutures.  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  Stenosis in the right axillary region appears to be elastic. Residual narrowing in the right axillary region despite balloon dilatation.  At the end of the procedure, there was flow in the graft but residual thrombus involving the aneurysms.  Impression:Successful declot of the right upperextremity graft.  Access management:  There are large aneurysms involving the right upper arm graft that make it very difficult to treat percutaneously.  Recommend surgical consultation if the graft occludes in the future.  Original Report Authenticated By: Markus Daft, M.D.   Ir Angio Av Shunt Addl Access  04/05/2011  *RADIOLOGY REPORT*  Clinical history:End-stage renal disease and clotted right upper extremity graft  PROCEDURE(S): DIALYSIS GRAFT DECLOT; GRAFT/VENOUS PTA; ULTRASOUND GUIDANCE FOR  VASCULAR ACCESS  Physician: Stephan Minister. Henn, MD  Medications:Heparin 3000 units, TPA 2 mg, Versed 2 mg, Fentanyl75 mcg. A radiology nurse monitored the patient for moderate sedation.  Moderate sedation time:65 minutes  Fluoroscopy time: 14.4 minutes  Contrast: 60 ml Omnipaque 300  Procedure:Informed consent was obtained for a declot procedure. The right upper arm was prepped and draped in a sterile fashion. Maximal barrier sterile technique was utilized including caps, mask, sterile gowns, sterile gloves,  sterile drape, hand hygiene and skin antiseptic.  The skin was anesthetized with 1% lidocaine. 21 gauge needle was advanced into the lower aspect of the graft with ultrasound guidance.  Micropuncture dilator set was placed.  A Bentson wire was advanced into the outflow vein and a 6-French vascular sheath was placed.  5-French Kumpe catheter was advanced in the central veins and a central venogram was performed.  2 mg of TPA was placed within the graft using the 5-French Kumpe catheter. The graft was treated with the Argon Cleaner thrombectomy device. The graft was accessed towards arterial anastomosis using a 21 gauge needle with ultrasound guidance.  A 6-French vascular sheath was placed with a Bentson wire.  It was very difficult to advance a catheter or wire through graft aneurysms.  Eventually, the Kumpe catheter was advanced into the arterial system and the arterial plug was pulled using a Fogarty balloon.  There was flow within the graft.  The venous anastomosis was angioplastied to 6 mm x 40 mm Conquest balloon.  The follow-up shuntogram images were obtained. The venous outflow vein was treated again with a 7 mm x 40 mm Conquest balloon.  Residual clot within the graft was treated with the thrombectomy device.  Vascular sheaths removed with pursestring sutures.  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  Stenosis in the right axillary region appears to be elastic. Residual narrowing in the right axillary region despite balloon dilatation.  At the end of the procedure, there was flow in the graft but residual thrombus involving the aneurysms.  Impression:Successful declot of the right upperextremity graft.  Access management:  There are large aneurysms involving the right upper arm graft that make it very difficult to treat percutaneously.  Recommend surgical consultation if the graft occludes in the future.  Original Report Authenticated By:  Markus Daft, M.D.   Ir US Guide Vasc Access Right  04/05/2011  *RADIOLOGY REPORT*  Clinical history:End-stage renal disease and clotted right upper extremity graft  PROCEDURE(S): DIALYSIS GRAFT DECLOT; GRAFT/VENOUS PTA; ULTRASOUND GUIDANCE FOR VASCULAR ACCESS  Physician: Stephan Minister. Henn, MD  Medications:Heparin 3000 units, TPA 2 mg, Versed 2 mg, Fentanyl75 mcg. A radiology nurse monitored the patient for moderate sedation.  Moderate sedation time:65 minutes  Fluoroscopy time: 14.4 minutes  Contrast: 60 ml Omnipaque 300  Procedure:Informed consent was obtained for a declot procedure. The right upper arm was prepped and draped in a sterile fashion. Maximal barrier sterile technique was utilized including caps, mask, sterile gowns, sterile gloves, sterile drape, hand hygiene and skin antiseptic.  The skin was anesthetized with 1% lidocaine. 21 gauge needle was advanced into the lower aspect of the graft with ultrasound guidance.  Micropuncture dilator set was placed.  A Bentson wire was advanced into the outflow vein and a 6-French vascular sheath was placed.  5-French Kumpe catheter was advanced in the central veins and a central venogram was performed.  2 mg of TPA was placed within the graft using the 5-French Kumpe catheter. The graft  was treated with the Argon Cleaner thrombectomy device. The graft was accessed towards arterial anastomosis using a 21 gauge needle with ultrasound guidance.  A 6-French vascular sheath was placed with a Bentson wire.  It was very difficult to advance a catheter or wire through graft aneurysms.  Eventually, the Kumpe catheter was advanced into the arterial system and the arterial plug was pulled using a Fogarty balloon.  There was flow within the graft.  The venous anastomosis was angioplastied to 6 mm x 40 mm Conquest balloon.  The follow-up shuntogram images were obtained. The venous outflow vein was treated again with a 7 mm x 40 mm Conquest balloon.  Residual clot within the graft  was treated with the thrombectomy device.  Vascular sheaths removed with pursestring sutures.  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  Stenosis in the right axillary region appears to be elastic. Residual narrowing in the right axillary region despite balloon dilatation.  At the end of the procedure, there was flow in the graft but residual thrombus involving the aneurysms.  Impression:Successful declot of the right upperextremity graft.  Access management:  There are large aneurysms involving the right upper arm graft that make it very difficult to treat percutaneously.  Recommend surgical consultation if the graft occludes in the future.  Original Report Authenticated By: Markus Daft, M.D.    Assessment/Plan:    LOS: 4 days  s/p   Shuntogram from interventional radiology performed on 04/05/2010 was reviewed by me. There was incomplete removal of thrombus from the graft due to the pseudoaneurysms. A mild to moderate stenosis at the venous anastomosis was dilated.   Plan Will probably insert new graft in right upper arm around existing graft and insert a dialysis catheter. Doubt revision of graft will be feasible with 2 large pseudoaneurysms involving majority of the area where puncture sites are located. We'll plan to proceed tomorrow with this. Discussed with patient and she agrees   Tinnie Gens, MD 04/07/2011 11:04 AM

## 2011-04-07 NOTE — Progress Notes (Signed)
On-call Internal Medicine Attending  Date: 04/07/2011  Patient name: [name] Medical record number: [id number] Date of birth: [birth year] Age: 61 y.o. Gender: female  I saw patient and discussed her care with house staff.  She complains of some lower abdominal pain, and reports a small amount of stool-like material per rectum this morning; otherwise she has no complaint.  She denies chest pain, shortness of breath, dizziness, or palpitations.  She remains a persistent sinus tachycardia with rates in the 130s, and overnight had acceleration of her right 160 range with the appearance of SVT or atrial flutter.  Exam shows clear lung; regular, tachycardic, 3/6 systolic murmur; soft abdomen, bowel sounds present, mild mid lower abdominal tenderness; no rebound; no lower extremity edema.    The cause of her persisting sinus tachycardia is not clear; she is not anemic, not having significant pain, and her TSH is normal.  She had a 10 minute episode overnight of what appears to be SVT or atrial flutter.  With her history of ischemic colitis, as well as history of intra-abdominal abscess that required drainage in the past, the sinus tachycardia is concerning for occult infection or inflammation.  Plans include blood cultures; 2-D echocardiogram to assess left ventricular function and valves; cardiology consult; would discuss the rectal discharge with GI, with a question of whether a repeat colonoscopy to assess for necrosis of her remaining colon is warranted; consider a CT scan of the abdomen and pelvis without contrast.

## 2011-04-07 NOTE — Progress Notes (Signed)
Subjective: Patient continues to complain of abdominal pain in lower quadrants.  She says the pain is not severe, but dull and achy.  She denies nausea, vomiting, diarrhea.  She does claim to have had one bowel movement per rectum (rather than out of there iliostomy) that was soft, not watery, non-bloody or melanotic.  She claims this is the first time this has occurred since getting the ostomy placed in 12/12.  She denies any urine output or vaginal discharge.  She has no other complaints.  She claims that when she is tachycardic (last night spiked to 160s), she cannot feel a difference.  She denies palpitations, light headedness, or chest pain.    Objective: Vital signs in last 24 hours: Filed Vitals:   04/06/11 2030 04/06/11 2052 04/06/11 2140 04/07/11 0443  BP: 139/90 140/97 135/94 107/75  Pulse: 129 129 131 141  Temp:  97.8 F (36.6 C) 97.9 F (36.6 C) 98.2 F (36.8 C)  TempSrc:  Oral Oral Oral  Resp:  15 16 18   Height:      Weight:  47.2 kg (104 lb 0.9 oz)    SpO2:  96% 96% 96%   Weight change: -0.8 kg (-1 lb 12.2 oz)  Intake/Output Summary (Last 24 hours) at 04/07/11 0755 Last data filed at 04/06/11 2052  Gross per 24 hour  Intake    290 ml  Output   1412 ml  Net  -1122 ml   Physical Exam: BP 107/75  Pulse 141  Temp(Src) 98.2 F (36.8 C) (Oral)  Resp 18  Ht 5\' 5"  (1.651 m)  Wt 47.2 kg (104 lb 0.9 oz)  BMI 17.32 kg/m2  SpO2 96% GEN: NAD, sitting up in bed, awake and alert HEENT: PERRL, EOMI, dry mucus membranes.  No erythema or exudate in oropharynx. Lungs: Normal respiratory effort. Clear to auscultation BL without crackles or wheezes.  Heart: tachycardic. Regular rhythm.  4/6 systolic murmur auscultated throughout precordium, does not radiate to carotids.  Abdomen: BS normoactive. Soft, Nondistended, non-tender to palpation in all 4 quadrants. No masses or organomegaly. Iliostomy present in RLQ with yellow output. No surrounding erythema or Exudate. Clean, dry,  intact.  Extremities: No pretibial edema. Renal access on R upper arm flow: + good thrills with auscultation.  Neurologic: A&Ox2 (at her baseline).  nonfocal  Lab Results: Basic Metabolic Panel:  Lab [id number] 0500 04/06/11 0600  NA 137 139  K 4.0 4.6  CL 95* 94*  CO2 27 26  GLUCOSE 109* 106*  BUN 33* 31*  CREATININE 4.99* 5.46*  CALCIUM 9.4 9.8  MG -- --  PHOS 2.9 4.5   Liver Function Tests:  Lab 04/07/11 0500 04/06/11 0600 04/03/11 1716  AST -- -- 31  ALT -- -- 15  ALKPHOS -- -- 119*  BILITOT -- -- 0.3  PROT -- -- 8.4*  ALBUMIN 3.5 3.4* --    Lab 04/03/11 1717  AMMONIA 17   CBC:  Lab 04/07/11 0500 04/06/11 0600 04/04/11 0640 04/03/11 1716  WBC 7.7 8.7 -- --  NEUTROABS -- -- 3.5 4.1  HGB 12.4 11.9* -- --  HCT 39.8 37.7 -- --  MCV 89.0 88.1 -- --  PLT 306 345 -- --   Cardiac Enzymes:  Lab 04/03/11 1721  CKTOTAL --  CKMB --  CKMBINDEX --  TROPONINI <0.30   BNP:  Lab 04/03/11 1720  PROBNP 2300.0*   Hemoglobin A1C:  Lab 04/04/11 0535  HGBA1C 4.3   Thyroid Function Tests:  Lab 04/04/11 [id number]  TSH 1.091  T4TOTAL --  FREET4 --  T3FREE --  THYROIDAB --   Coagulation:  Lab 04/03/11 1716  LABPROT 13.9  INR 1.05   Micro Results: Recent Results (from the past 240 hour(s))  MRSA PCR SCREENING     Status: Normal   Collection Time   04/03/11 10:38 PM      Component Value Range Status Comment   MRSA by PCR NEGATIVE  NEGATIVE  Final   CLOSTRIDIUM DIFFICILE BY PCR     Status: Normal   Collection Time   04/04/11 12:04 AM      Component Value Range Status Comment   C difficile by pcr NEGATIVE  NEGATIVE  Final    Studies/Results: Ir Pta Venous Right  04/05/2011  *RADIOLOGY REPORT*  Clinical history:End-stage renal disease and clotted right upper extremity graft  PROCEDURE(S): DIALYSIS GRAFT DECLOT; GRAFT/VENOUS PTA; ULTRASOUND GUIDANCE FOR VASCULAR ACCESS  Physician: Stephan Minister. Henn, MD  Medications:Heparin 3000 units, TPA 2 mg, Versed 2 mg, Fentanyl75  mcg. A radiology nurse monitored the patient for moderate sedation.  Moderate sedation time:65 minutes  Fluoroscopy time: 14.4 minutes  Contrast: 60 ml Omnipaque 300  Procedure:Informed consent was obtained for a declot procedure. The right upper arm was prepped and draped in a sterile fashion. Maximal barrier sterile technique was utilized including caps, mask, sterile gowns, sterile gloves, sterile drape, hand hygiene and skin antiseptic.  The skin was anesthetized with 1% lidocaine. 21 gauge needle was advanced into the lower aspect of the graft with ultrasound guidance.  Micropuncture dilator set was placed.  A Bentson wire was advanced into the outflow vein and a 6-French vascular sheath was placed.  5-French Kumpe catheter was advanced in the central veins and a central venogram was performed.  2 mg of TPA was placed within the graft using the 5-French Kumpe catheter. The graft was treated with the Argon Cleaner thrombectomy device. The graft was accessed towards arterial anastomosis using a 21 gauge needle with ultrasound guidance.  A 6-French vascular sheath was placed with a Bentson wire.  It was very difficult to advance a catheter or wire through graft aneurysms.  Eventually, the Kumpe catheter was advanced into the arterial system and the arterial plug was pulled using a Fogarty balloon.  There was flow within the graft.  The venous anastomosis was angioplastied to 6 mm x 40 mm Conquest balloon.  The follow-up shuntogram images were obtained. The venous outflow vein was treated again with a 7 mm x 40 mm Conquest balloon.  Residual clot within the graft was treated with the thrombectomy device.  Vascular sheaths removed with pursestring sutures.  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  Stenosis in the right axillary region appears to be elastic. Residual narrowing in the right axillary region despite balloon dilatation.  At the end of  the procedure, there was flow in the graft but residual thrombus involving the aneurysms.  Impression:Successful declot of the right upperextremity graft.  Access management:  There are large aneurysms involving the right upper arm graft that make it very difficult to treat percutaneously.  Recommend surgical consultation if the graft occludes in the future.  Original Report Authenticated By: Markus Daft, M.D.   Ir Av Dialysis Graft Declot  04/05/2011  *RADIOLOGY REPORT*  Clinical history:End-stage renal disease and clotted right upper extremity graft  PROCEDURE(S): DIALYSIS GRAFT DECLOT; GRAFT/VENOUS PTA; ULTRASOUND GUIDANCE FOR VASCULAR ACCESS  Physician: Stephan Minister. Anselm Pancoast, MD  Medications:Heparin 3000 units, TPA  2 mg, Versed 2 mg, Fentanyl75 mcg. A radiology nurse monitored the patient for moderate sedation.  Moderate sedation time:65 minutes  Fluoroscopy time: 14.4 minutes  Contrast: 60 ml Omnipaque 300  Procedure:Informed consent was obtained for a declot procedure. The right upper arm was prepped and draped in a sterile fashion. Maximal barrier sterile technique was utilized including caps, mask, sterile gowns, sterile gloves, sterile drape, hand hygiene and skin antiseptic.  The skin was anesthetized with 1% lidocaine. 21 gauge needle was advanced into the lower aspect of the graft with ultrasound guidance.  Micropuncture dilator set was placed.  A Bentson wire was advanced into the outflow vein and a 6-French vascular sheath was placed.  5-French Kumpe catheter was advanced in the central veins and a central venogram was performed.  2 mg of TPA was placed within the graft using the 5-French Kumpe catheter. The graft was treated with the Argon Cleaner thrombectomy device. The graft was accessed towards arterial anastomosis using a 21 gauge needle with ultrasound guidance.  A 6-French vascular sheath was placed with a Bentson wire.  It was very difficult to advance a catheter or wire through graft aneurysms.   Eventually, the Kumpe catheter was advanced into the arterial system and the arterial plug was pulled using a Fogarty balloon.  There was flow within the graft.  The venous anastomosis was angioplastied to 6 mm x 40 mm Conquest balloon.  The follow-up shuntogram images were obtained. The venous outflow vein was treated again with a 7 mm x 40 mm Conquest balloon.  Residual clot within the graft was treated with the thrombectomy device.  Vascular sheaths removed with pursestring sutures.  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  Stenosis in the right axillary region appears to be elastic. Residual narrowing in the right axillary region despite balloon dilatation.  At the end of the procedure, there was flow in the graft but residual thrombus involving the aneurysms.  Impression:Successful declot of the right upperextremity graft.  Access management:  There are large aneurysms involving the right upper arm graft that make it very difficult to treat percutaneously.  Recommend surgical consultation if the graft occludes in the future.  Original Report Authenticated By: Markus Daft, M.D.   Ir Angio Av Shunt Addl Access  04/05/2011  *RADIOLOGY REPORT*  Clinical history:End-stage renal disease and clotted right upper extremity graft  PROCEDURE(S): DIALYSIS GRAFT DECLOT; GRAFT/VENOUS PTA; ULTRASOUND GUIDANCE FOR VASCULAR ACCESS  Physician: Stephan Minister. Henn, MD  Medications:Heparin 3000 units, TPA 2 mg, Versed 2 mg, Fentanyl75 mcg. A radiology nurse monitored the patient for moderate sedation.  Moderate sedation time:65 minutes  Fluoroscopy time: 14.4 minutes  Contrast: 60 ml Omnipaque 300  Procedure:Informed consent was obtained for a declot procedure. The right upper arm was prepped and draped in a sterile fashion. Maximal barrier sterile technique was utilized including caps, mask, sterile gowns, sterile gloves, sterile drape, hand hygiene and skin  antiseptic.  The skin was anesthetized with 1% lidocaine. 21 gauge needle was advanced into the lower aspect of the graft with ultrasound guidance.  Micropuncture dilator set was placed.  A Bentson wire was advanced into the outflow vein and a 6-French vascular sheath was placed.  5-French Kumpe catheter was advanced in the central veins and a central venogram was performed.  2 mg of TPA was placed within the graft using the 5-French Kumpe catheter. The graft was treated with the Argon Cleaner thrombectomy device. The graft was accessed towards  arterial anastomosis using a 21 gauge needle with ultrasound guidance.  A 6-French vascular sheath was placed with a Bentson wire.  It was very difficult to advance a catheter or wire through graft aneurysms.  Eventually, the Kumpe catheter was advanced into the arterial system and the arterial plug was pulled using a Fogarty balloon.  There was flow within the graft.  The venous anastomosis was angioplastied to 6 mm x 40 mm Conquest balloon.  The follow-up shuntogram images were obtained. The venous outflow vein was treated again with a 7 mm x 40 mm Conquest balloon.  Residual clot within the graft was treated with the thrombectomy device.  Vascular sheaths removed with pursestring sutures.  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  Stenosis in the right axillary region appears to be elastic. Residual narrowing in the right axillary region despite balloon dilatation.  At the end of the procedure, there was flow in the graft but residual thrombus involving the aneurysms.  Impression:Successful declot of the right upperextremity graft.  Access management:  There are large aneurysms involving the right upper arm graft that make it very difficult to treat percutaneously.  Recommend surgical consultation if the graft occludes in the future.  Original Report Authenticated By: Markus Daft, M.D.   Ir US Guide Vasc  Access Right  04/05/2011  *RADIOLOGY REPORT*  Clinical history:End-stage renal disease and clotted right upper extremity graft  PROCEDURE(S): DIALYSIS GRAFT DECLOT; GRAFT/VENOUS PTA; ULTRASOUND GUIDANCE FOR VASCULAR ACCESS  Physician: Stephan Minister. Henn, MD  Medications:Heparin 3000 units, TPA 2 mg, Versed 2 mg, Fentanyl75 mcg. A radiology nurse monitored the patient for moderate sedation.  Moderate sedation time:65 minutes  Fluoroscopy time: 14.4 minutes  Contrast: 60 ml Omnipaque 300  Procedure:Informed consent was obtained for a declot procedure. The right upper arm was prepped and draped in a sterile fashion. Maximal barrier sterile technique was utilized including caps, mask, sterile gowns, sterile gloves, sterile drape, hand hygiene and skin antiseptic.  The skin was anesthetized with 1% lidocaine. 21 gauge needle was advanced into the lower aspect of the graft with ultrasound guidance.  Micropuncture dilator set was placed.  A Bentson wire was advanced into the outflow vein and a 6-French vascular sheath was placed.  5-French Kumpe catheter was advanced in the central veins and a central venogram was performed.  2 mg of TPA was placed within the graft using the 5-French Kumpe catheter. The graft was treated with the Argon Cleaner thrombectomy device. The graft was accessed towards arterial anastomosis using a 21 gauge needle with ultrasound guidance.  A 6-French vascular sheath was placed with a Bentson wire.  It was very difficult to advance a catheter or wire through graft aneurysms.  Eventually, the Kumpe catheter was advanced into the arterial system and the arterial plug was pulled using a Fogarty balloon.  There was flow within the graft.  The venous anastomosis was angioplastied to 6 mm x 40 mm Conquest balloon.  The follow-up shuntogram images were obtained. The venous outflow vein was treated again with a 7 mm x 40 mm Conquest balloon.  Residual clot within the graft was treated with the thrombectomy  device.  Vascular sheaths removed with pursestring sutures.  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  Stenosis in the right axillary region appears to be elastic. Residual narrowing in the right axillary region despite balloon dilatation.  At the end of the procedure, there was flow in the  graft but residual thrombus involving the aneurysms.  Impression:Successful declot of the right upperextremity graft.  Access management:  There are large aneurysms involving the right upper arm graft that make it very difficult to treat percutaneously.  Recommend surgical consultation if the graft occludes in the future.  Original Report Authenticated By: Markus Daft, M.D.   Medications: I have reviewed the patient's current medications. Scheduled Meds:   . alteplase  2 mg Intracatheter to XRAY  . ciprofloxacin  500 mg Oral Q breakfast  . darbepoetin      . darbepoetin  100 mcg Intravenous Q Sat-HD  . heparin  5,000 Units Subcutaneous Q8H  . multivitamin  1 tablet Oral QHS  . pantoprazole  40 mg Oral Q1200  . sodium chloride  3 mL Intravenous Q12H  . traMADol  50 mg Oral Q6H  . white petrolatum       Continuous Infusions:   . sodium chloride 50 mL/hr (04/05/11 1400)   PRN Meds:.sodium chloride, sodium chloride, sodium chloride, sodium chloride, acetaminophen, acetaminophen, alteplase, camphor-menthol, docusate sodium, feeding supplement (NEPRO CARB STEADY), fentaNYL, heparin, heparin, hydrOXYzine, lidocaine, lidocaine-prilocaine, midazolam, ondansetron (ZOFRAN) IV, ondansetron, pentafluoroprop-tetrafluoroeth, sodium chloride, sorbitol, zolpidem, DISCONTD: gi cocktail Assessment/Plan:  ##. Sinus tachycardia - chronic at least since admission in 01/2011. Patient spiked to 160s overnight.  ECG showed sinus tachycardia.  Patient monitored on telemetry and showed no other abnormalities.   - TSH : 1.09 wnl  - Monitor on Telemetry  - HD will  monitor fluid status closely  - Discontinue any meds with anti-cholinergic properties including GI cocktail, which the patient received twice yesterday.  ##. ESRD on HD (Tues, Thurs, Sat on [employer]) -Has had HD for last two days following de-clotting of her graft on 1/18.  During declotting radiology found aneurysms in the graft and were not optimistic about its future use.  Yesterday during dialysis, clots were once  again found in her graft and the dialysis session was not successful.  Nephrology suspects graft failure.  They have been in contact with vascular surgery and patient will likely have a catheter or a catheter and new graft placed tomorrow.   - Appreciate Renal's input.   ##. abdominal pain- Unclear etiology.  History per patient of one bowel movement per rectum, the first since receiving ostomy in 12/12.  Ostomy output appears healthy and unchanged.  Could also be vaginal infection, UTI (less likely given dialysis dependence), or GERD. - Continue protonix.   - Discontinue GI cocktail due to anticholinergic properties in the setting of tachycardia. - Collect stool from rectum if occurs again for C. Diff testing.    ##. Altered mental status -Resolved. Seems back to her baseline, A&O x2.   ##. Question of borderline diabetes - HbA1C : 4.3 , CBGs have been well controlled.   ##.Anemia of chronic disease - BL Hgb 8-10. Currently stable.  - Continue Aranesp per renal.   ##. History of ischemic colitis - stable. s/p partial colectomy of right hemicolon with ileostomy per Dr. Ninfa Linden. Patient continues to be on Cipro therapy until follow-up with Dr. Ninfa Linden. Ileostomy stump looks healthy without surrounding maceration of skin or friability. Wound care was consulted for ostomy mgmt. - Continue ciprofloxacin once daily   ##. Lumbar stenosis s/p lumbar disc surgery in 11/12 - Wound has healed well. Patient denies back pain. Reports neuropathic leg and foot pain.  - Continue to hold  percocet.  - Tylenol for pain.  - will need to consider safe med  dispo plan for gabapentin at discharge  DVT PPx - SQ heparin   LOS: 4 days   Keturah Shavers 04/07/2011, 7:55 AM  PGY 1 Addendum S: No complaints this morning other than chronic feet pain/burning. Wants to go home. O: Vitals reviewed General: resting in bed HEENT: PERRL, EOMI, no scleral icterus, dry MM Cardiac: tachycardic, systolic murmur loudest at apex Pulm: clear to auscultation bilaterally, moving normal volumes of air Abd: soft, nontender, nondistended, BS present, RLQ ostomy bag in place without bloody/exudative discharge Ext: warm and well perfused, no pedal edema, RUE renal access +thrill Neuro: alert and oriented X2  Labs reviewed.   A/P: Discussed case with MS IV and agree with assessment/plan.  Regarding tachycardia, CXR from 04/03/11 does not suggest cardiopulmonary disease and patient's Hb remains stable.  Patient has remained asymptomatic.  Regarding her pain, we will need a safe d/c plan for med mgmt, as her AMS likely influenced by accidental med OD. Regarding BM per rectum will perform rectal exam and discuss with gen surg.

## 2011-04-07 NOTE — Progress Notes (Signed)
Subjective:  No current complaints, reports bowel movement rectally yesterday, although with colostomy   Objective: Vital signs in last 24 hours: Temp:  [97.5 F (36.4 C)-98.9 F (37.2 C)] 98.2 F (36.8 C) (01/20 0443) Pulse Rate:  [118-141] 141  (01/20 0443) Resp:  [15-26] 18  (01/20 0443) BP: (102-140)/(68-101) 107/75 mmHg (01/20 0443) SpO2:  [93 %-97 %] 96 % (01/20 0443) Weight:  [47.2 kg (104 lb 0.9 oz)-48 kg (105 lb 13.1 oz)] 47.2 kg (104 lb 0.9 oz) (01/19 2052) Weight change: -0.8 kg (-1 lb 12.2 oz)  Intake/Output from previous day: 01/19 0701 - 01/20 0700 In: 290 [P.O.:290] Out: 1412 [Stool:550]   EXAM: General appearance:  Alert, oriented x 3, in no apparent distress Resp:  CTA bilaterally  Cardio:  Tachycardic with Gr II/IV systolic murmur GI:  + BS, soft and nontender, RLQ ileostomy Extremities:  No edema Access:  AVG @ RUA  Lab Results:  Basename 04/07/11 0500 04/06/11 0600  WBC 7.7 8.7  HGB 12.4 11.9*  HCT 39.8 37.7  PLT 306 345   BMET:  Basename 04/07/11 0500 04/06/11 0600  NA 137 139  K 4.0 4.6  CL 95* 94*  CO2 27 26  GLUCOSE 109* 106*  BUN 33* 31*  CREATININE 4.99* 5.46*  CALCIUM 9.4 9.8  ALBUMIN 3.5 3.4*   No results found for this basename: PTH:2 in the last 72 hours Iron Studies: No results found for this basename: IRON,TIBC,TRANSFERRIN,FERRITIN in the last 72 hours  Dialysis Orders: Center: [place] on TTS.  EDW 51KgHD Bath 2K/2.25Ca Time 3:30 Heparin tight. Access RUA AVG (placed 1996) BFR 400 DFR A1.5      Zemplar none mcg IV/HD Epogen 28,000 Units IV/HD Venofer 100mg  IV qtx (duration unknown)  Other Optiflux 160; UF profile 2; linear 148; # 15g needles  Assessment/Plan: 1.  AMS - believed to be secondary to medications (Neurontin, Oxycodone, also on Cipro at time of admission) or hypercalcemia (11.4 on 1/17, improved to 9.4 today s/p HD on 2Ca); afebrile.  2.  ESRD - on HD on TTS, s/p HD yesterday, K 4 today.  Next HD on 1/22.  3.  Vascular  access - s/p declot of AVG on 1/18, required premedication for contrast dye allergy; access used yesterday, but multiple clots were removed from access.  Will require evaluation by [facility].  4.  Anemia - Hgb 12.4 s/p Aranesp 100 mcg yesterday.  5.  HTN/Volume - BP fairly well controlled, no signs or symptoms of fluid overload.  6.  Secondary hyperparathyroidism - Ca 9.4, P 3.4, Calcium-based binders held, no Zemplar.  7.  Ischemic colitis - s/p right hemicolectomy and ileostomy.  8.  Tachycardia - HR in 130s to 140s since last night, EKG pending this AM.     LOS: 4 days   [name],CHARLES 04/07/2011,7:29 AM  Patient seen and examined and agree with assessment and plan as above.  MS better.  The right arm AVG is not functioning well in spite of thrombolysis Friday by IR, with poor BFR and multiple clots coming into the lines during dialysis.  IR recommended surgery eval if further malfunction.  The access is old and has multiple large pseudoaneurysms. Discussed with [facility] (Dr. Kellie Simmering)- they will put her on schedule for access procedure tomorrow.   Kelly Splinter, MD [employer] [phone] cell 04/07/2011, 2:48 PM

## 2011-04-07 NOTE — Consult Note (Signed)
CARDIOLOGY CONSULT NOTE  Patient ID: [name] MRN: [id number] DOB/AGE: 07/06/1950 61 y.o.  Admit date: 04/03/2011 Primary Physician  Primary Cardiologist None Chief Complaint   Tachycardia  HPI: The patient was admitted with AMS.  This was thought be be multifactorial with some component related to medication changes.  During this admission she has had her usual dialysis with balloon angioplasty of her dialysis graft.  However this was an incomplete removal of thrombus and she will be requiring graft revision.   She has also had lower quadrant abdominal pain of unclear etiology.  We are called because her heart rate has been consistently elevated with a question of SVT/Flutter.  The patient has no significant past cardiovascular history and reports a negative nuclear stress test at [facility] as part of a renal transplant work up.  I am not sure of the date of this but she says it was recent. She denies any ongoing cardiovascular symptoms. She's had a long complicated course since her back surgery. She gets around with a walker and wheelchair and is limited by bilateral foot drop. With this level of activity in rehabilitation she does not report any chest discomfort, neck or arm discomfort. She does not report palpitations, presyncope or syncope. She describes any PND or orthopnea. She's had no fevers or chills.  During this hospitalization she's had some mild abdominal discomfort but says she's not in distress. She denies any acute change in her bowel habits. Of note her TSH was normal  Past Medical History  Diagnosis Date  . Seizures     r/t HTN in 1990's x 1  . Brain aneurysm     No records could be found  . Blood transfusion 1990's    r/t Kidney removal surgery  . Arthritis     Back  . Anemia     anemia of chronic disease likely 2/2 ESRD per last anemia panel (02/2011) with Fe 35, TIBC 167, ferritin 2041  // BL Hgb 8-10  . Umbilical hernia age 80  . Heart murmur    Born with heart mumur, does not require follow up per pt  . Hypertension     Does not see a heart doctor, had pre transplant stress test at [facility]    . Diabetes mellitus     Borderline  . End stage renal disease on dialysis     Secondary to hypertension // T/Th/Sat dialysis on [employer]  . Hepatitis C   . Colitis, ischemic 01/2011    S/P partial colectomy of right hemicolon and ileostomy placement  . Lumbar spinal stenosis      bilateral L4-5 and L5-S1 posterior lumbar interbody arthrodesis and bilateral L4-S1 posterior lateral arthrodesis for lumbar stenosis dynamic lumbar spondylolisthesis lumbar spondylosis lumbar radiculopathy with foot drop // s/p surgery 01/2011 - see surgery section for details.    Past Surgical History  Procedure Date  . Nephrectomy 2010    right side done at [facility] on transplant list  . Umbilical hernia repair age 71- 89  . Appendectomy 1960's  . Vascular surgery     right arm dialysis graft  . Tonsillectomy     as a child  . Rib resection     d/t kidney removal  . Back surgery 01/30/2011    L4-S1 lumbar laminectomy facetectomy and foraminotomies for decompression a bilateral L4-5 and L5-S1 posterior lumbar interbody arthrodesis and bilateral L4-S1 posterior lateral arthrodesis for lumbar stenosis dynamic lumbar spondylolisthesis lumbar spondylosis lumbar radiculopathy with foot drop  .  Laparotomy 02/09/2011    Procedure: EXPLORATORY LAPAROTOMY;  Surgeon: Harl Bowie, MD;  Location: [place];  Service: General;  Laterality: N/A;  . Partial colectomy 02/09/2011    Reason for surgery: ischemic colitis of right hemicolon; Surgeon: Harl Bowie, MD;  Location: [place];  Service: General;  Laterality: Right;  . Colostomy 02/09/2011    Reason for surgery: ischemic colitis of right hemicolon requiring partial colectomy and ileostomy.  Surgeon: Harl Bowie, MD;  Location: [place];  Service: General;  Laterality: Right;  . Colonoscopy 02/19/2011     Procedure: COLONOSCOPY;  Surgeon: Missy Sabins, MD;  Location: [place];  Service: Endoscopy;  Laterality: N/A;  . Colonoscopy 03/07/2011    Procedure: COLONOSCOPY;  Surgeon: Cleotis Nipper, MD;  Location: [facility] ENDOSCOPY;  Service: Endoscopy;  Laterality: N/A;  no prep needed--pt has long Hartmann's pouch and ileostomy  . Total parathyroidectomy 01/17/2001    with autotransplantation into left forearm // due to secondary hyperparathyroidism from ESRD    Allergies  Allergen Reactions  . Contrast Media (Iodinated Diagnostic Agents)     Seziure  . Iohexol Other (See Comments)    Reaction is convulsions   Prescriptions prior to admission  Medication Sig Dispense Refill  . ciprofloxacin (CIPRO) 500 MG tablet Take 500 mg by mouth 2 (two) times daily.      . darbepoetin (ARANESP) 200 MCG/0.4ML SOLN Inject 200 mcg into the skin. Administered at dialysis per nephrology      . gabapentin (NEURONTIN) 300 MG capsule Take 300 mg by mouth daily. Started Friday PTA      . multivitamin (RENA-VIT) TABS tablet Take 1 tablet by mouth daily.  30 tablet  1  . oxyCODONE-acetaminophen (PERCOCET) 5-325 MG per tablet Take 1 tablet by mouth every 4 (four) hours as needed.      . pantoprazole (PROTONIX) 40 MG tablet Take 1 tablet (40 mg total) by mouth daily at 12 noon.  30 tablet  1   Family History  Problem Relation Age of Onset  . Anesthesia problems Neg Hx   . Hypotension Neg Hx   . Malignant hyperthermia Neg Hx   . Pseudochol deficiency Neg Hx   . Diabetes Father     History   Social History  . Marital Status: Legally Separated    Spouse Name: N/A    Number of Children: 2  . Years of Education: 10th grade   Occupational History  . Retired     previously worked as a Producer, television/film/video History Main Topics  . Smoking status: Former Smoker -- 0.5 packs/day for 48 years    Types: Cigarettes    Quit date: 08/17/2010  . Smokeless tobacco: Never Used  . Alcohol Use: 1.2 oz/week    2 Shots of  liquor per week     1 pint on Fridays  . Drug Use: No  . Sexually Active: No   Other Topics Concern  . Not on file   Social History Narrative  . No narrative on file     ROS:  As stated in the HPI and negative for all other systems.  Physical Exam: Blood pressure 143/90, pulse 128, temperature 98.2 F (36.8 C), temperature source Oral, resp. rate 17, height 5\' 5"  (1.651 m), weight 47.2 kg (104 lb 0.9 oz), SpO2 97.00%.  GENERAL:  Frail and thin but in no distress HEENT:  Pupils equal round and reactive, fundi not visualized, oral mucosa unremarkable NECK:  No jugular venous  distention, waveform within normal limits, carotid upstroke brisk and symmetric, no bruits, no thyromegaly LYMPHATICS:  No cervical, inguinal adenopathy LUNGS:  Clear to auscultation bilaterally BACK:  No CVA tenderness CHEST:  Unremarkable HEART:  PMI not displaced or sustained,S1 and S2 within normal limits, no S3, no S4, no clicks, no rubs, apical systolic murmurs nonradiating, questionable flow murmur ABD:  Flat, positive bowel sounds normal in frequency in pitch, no bruits, no rebound, no guarding, no midline pulsatile mass, no hepatomegaly, no splenomegaly EXT:  2 plus pulses throughout, no edema, no cyanosis no clubbing, right upper arm thrill and bruit over the dialysis fistula. SKIN:  No rashes no nodules NEURO:  Cranial nerves II through XII grossly intact, motor grossly intact throughout except for bilateral foot drop. PSYCH:  Cognitively intact, oriented to person place and time  Labs: Lab Results  Component Value Date   BUN 33* 04/07/2011   Lab Results  Component Value Date   CREATININE 4.99* 04/07/2011   Lab Results  Component Value Date   NA 137 04/07/2011   K 4.0 04/07/2011   CL 95* 04/07/2011   CO2 27 04/07/2011   Lab Results  Component Value Date   TROPONINI <0.30 04/03/2011   Lab Results  Component Value Date   WBC 7.7 04/07/2011   HGB 12.4 04/07/2011   HCT 39.8 04/07/2011   MCV 89.0  04/07/2011   PLT 306 04/07/2011   No results found for this basename: CHOL,  HDL,  LDLCALC,  LDLDIRECT,  TRIG,  CHOLHDL   Lab Results  Component Value Date   ALT 15 04/03/2011   AST 31 04/03/2011   ALKPHOS 119* 04/03/2011   BILITOT 0.3 04/03/2011     EKG:  Sinus tachycardia, rate 135, poor anterior R wave progression cannot exclude anteroseptal infarct, possible inferior infarct, possible right atrium enlargement, no acute ST-T wave changes  04/07/2011   ASSESSMENT AND PLAN:   1)  Tachycardia:  The predominance of sinus tachycardia. Her electrolytes are normal. She doesn't seem to be in distress to be causing this degree of tachycardia. I will check orthostatics. I agree with the echocardiogram. I did review the entire telemetry display and there was a short episode of a supraventricular tachycardia. This does not appear to be flutter or fit and it was not particularly symptomatic. This could be followed as an outpatient with event monitors to see if this is frequent.  2)  Murmur:  The patient has an apical murmur as described. Echocardiography has been ordered.   SignedMinus Breeding 04/07/2011, 3:28 PM

## 2011-04-08 ENCOUNTER — Encounter (HOSPITAL_COMMUNITY): Payer: Self-pay | Admitting: Anesthesiology

## 2011-04-08 ENCOUNTER — Encounter: Payer: Medicare Other | Attending: Physical Medicine & Rehabilitation | Admitting: Physical Medicine & Rehabilitation

## 2011-04-08 ENCOUNTER — Encounter (HOSPITAL_COMMUNITY)
Admission: EM | Disposition: A | Discharge status: Home / Self Care | Payer: Self-pay | Source: Home / Self Care | Attending: Internal Medicine

## 2011-04-08 ENCOUNTER — Inpatient Hospital Stay (HOSPITAL_COMMUNITY): Payer: Medicare Other | Admitting: Anesthesiology

## 2011-04-08 DIAGNOSIS — T82898A Other specified complication of vascular prosthetic devices, implants and grafts, initial encounter: Secondary | ICD-10-CM

## 2011-04-08 DIAGNOSIS — N186 End stage renal disease: Secondary | ICD-10-CM

## 2011-04-08 HISTORY — PX: AV FISTULA PLACEMENT: SHX1204

## 2011-04-08 LAB — CBC
Hemoglobin: 11.8 g/dL — ABNORMAL LOW (ref 12.0–15.0)
MCH: 27.9 pg (ref 26.0–34.0)
RBC: 4.23 MIL/uL (ref 3.87–5.11)

## 2011-04-08 LAB — BASIC METABOLIC PANEL
CO2: 25 mEq/L (ref 19–32)
Calcium: 9.4 mg/dL (ref 8.4–10.5)
Chloride: 95 mEq/L — ABNORMAL LOW (ref 96–112)
Glucose, Bld: 85 mg/dL (ref 70–99)
Potassium: 3.9 mEq/L (ref 3.5–5.1)
Sodium: 137 mEq/L (ref 135–145)

## 2011-04-08 SURGERY — INSERTION OF ARTERIOVENOUS (AV) GORE-TEX GRAFT ARM
Anesthesia: General | Site: Arm Upper | Laterality: Right | Wound class: Clean

## 2011-04-08 MED ORDER — PREGABALIN 25 MG PO CAPS
25.0000 mg | ORAL_CAPSULE | Freq: Every evening | ORAL | Status: DC | PRN
  Administered 2011-04-08: 25 mg via ORAL
  Filled 2011-04-08: qty 1

## 2011-04-08 MED ORDER — SODIUM CHLORIDE 0.9 % IV SOLN
INTRAVENOUS | Status: DC | PRN
  Administered 2011-04-08: 15:00:00 via INTRAVENOUS

## 2011-04-08 MED ORDER — PHENYLEPHRINE HCL 10 MG/ML IJ SOLN
10.0000 mg | INTRAVENOUS | Status: DC | PRN
  Administered 2011-04-08: 60 ug/min via INTRAVENOUS

## 2011-04-08 MED ORDER — HEPARIN SODIUM (PORCINE) 1000 UNIT/ML DIALYSIS
20.0000 [IU]/kg | INTRAMUSCULAR | Status: DC | PRN
  Administered 2011-04-09: 900 [IU] via INTRAVENOUS_CENTRAL
  Filled 2011-04-08: qty 1

## 2011-04-08 MED ORDER — HYDROMORPHONE HCL PF 1 MG/ML IJ SOLN
INTRAMUSCULAR | Status: AC
  Administered 2011-04-08: 0.5 mg
  Filled 2011-04-08: qty 1

## 2011-04-08 MED ORDER — FENTANYL CITRATE 0.05 MG/ML IJ SOLN
INTRAMUSCULAR | Status: DC | PRN
  Administered 2011-04-08: 100 ug via INTRAVENOUS

## 2011-04-08 MED ORDER — MIDAZOLAM HCL 5 MG/5ML IJ SOLN
INTRAMUSCULAR | Status: DC | PRN
  Administered 2011-04-08: 2 mg via INTRAVENOUS

## 2011-04-08 MED ORDER — DEXTROSE 5 % IV SOLN
1.5000 g | INTRAVENOUS | Status: DC | PRN
  Administered 2011-04-08: 1.5 g via INTRAVENOUS

## 2011-04-08 MED ORDER — PROPOFOL 10 MG/ML IV BOLUS
INTRAVENOUS | Status: DC | PRN
  Administered 2011-04-08: 140 mg via INTRAVENOUS

## 2011-04-08 MED ORDER — PROMETHAZINE HCL 25 MG/ML IJ SOLN: 6.2500 mg | INTRAMUSCULAR | Status: DC | PRN

## 2011-04-08 MED ORDER — HYDROMORPHONE HCL PF 1 MG/ML IJ SOLN
0.2500 mg | INTRAMUSCULAR | Status: DC | PRN
  Administered 2011-04-08 (×2): 0.5 mg via INTRAVENOUS

## 2011-04-08 MED ORDER — METOPROLOL TARTRATE 1 MG/ML IV SOLN
INTRAVENOUS | Status: DC | PRN
  Administered 2011-04-08 (×3): 1 mg via INTRAVENOUS

## 2011-04-08 MED ORDER — SODIUM CHLORIDE 0.9 % IR SOLN
Status: DC | PRN
  Administered 2011-04-08: 16:00:00

## 2011-04-08 MED ORDER — OXYCODONE HCL 5 MG PO TABS
5.0000 mg | ORAL_TABLET | ORAL | Status: DC | PRN
  Administered 2011-04-09 – 2011-04-10 (×3): 5 mg via ORAL
  Filled 2011-04-08 (×3): qty 1

## 2011-04-08 MED ORDER — 0.9 % SODIUM CHLORIDE (POUR BTL) OPTIME
TOPICAL | Status: DC | PRN
  Administered 2011-04-08: 1000 mL

## 2011-04-08 MED ORDER — MEPERIDINE HCL 25 MG/ML IJ SOLN: 6.2500 mg | INTRAMUSCULAR | Status: DC | PRN

## 2011-04-08 MED ORDER — HYDROMORPHONE HCL PF 1 MG/ML IJ SOLN: 0.2500 mg | INTRAMUSCULAR | Status: DC | PRN

## 2011-04-08 MED ORDER — LIDOCAINE HCL (CARDIAC) 20 MG/ML IV SOLN
INTRAVENOUS | Status: DC | PRN
  Administered 2011-04-08: 60 mg via INTRAVENOUS

## 2011-04-08 SURGICAL SUPPLY — 71 items
APL SKNCLS STERI-STRIP NONHPOA (GAUZE/BANDAGES/DRESSINGS) ×2
BAG DECANTER FOR FLEXI CONT (MISCELLANEOUS) ×3 IMPLANT
BENZOIN TINCTURE PRP APPL 2/3 (GAUZE/BANDAGES/DRESSINGS) ×3 IMPLANT
BLADE SURG 11 STRL SS (BLADE) ×2 IMPLANT
CANISTER SUCTION 2500CC (MISCELLANEOUS) ×3 IMPLANT
CATH CANNON HEMO 15F 50CM (CATHETERS) IMPLANT
CATH CANNON HEMO 15FR 19 (HEMODIALYSIS SUPPLIES) IMPLANT
CATH CANNON HEMO 15FR 23CM (HEMODIALYSIS SUPPLIES) IMPLANT
CATH CANNON HEMO 15FR 31CM (HEMODIALYSIS SUPPLIES) IMPLANT
CATH CANNON HEMO 15FR 32 (HEMODIALYSIS SUPPLIES) IMPLANT
CATH CANNON HEMO 15FR 32CM (HEMODIALYSIS SUPPLIES) IMPLANT
CATH EMB 3FR 80CM (CATHETERS) ×2 IMPLANT
CLIP LIGATING EXTRA MED SLVR (CLIP) ×3 IMPLANT
CLIP LIGATING EXTRA SM BLUE (MISCELLANEOUS) ×3 IMPLANT
CLOTH BEACON ORANGE TIMEOUT ST (SAFETY) ×3 IMPLANT
COVER PROBE W GEL 5X96 (DRAPES) IMPLANT
COVER SURGICAL LIGHT HANDLE (MISCELLANEOUS) ×6 IMPLANT
DECANTER SPIKE VIAL GLASS SM (MISCELLANEOUS) ×3 IMPLANT
DRAPE C-ARM 42X72 X-RAY (DRAPES) ×1 IMPLANT
DRAPE CHEST BREAST 15X10 FENES (DRAPES) ×1 IMPLANT
ELECT REM PT RETURN 9FT ADLT (ELECTROSURGICAL) ×3
ELECTRODE REM PT RTRN 9FT ADLT (ELECTROSURGICAL) ×2 IMPLANT
GAUZE SPONGE 2X2 8PLY STRL LF (GAUZE/BANDAGES/DRESSINGS) ×2 IMPLANT
GAUZE SPONGE 4X4 12PLY STRL LF (GAUZE/BANDAGES/DRESSINGS) ×2 IMPLANT
GAUZE SPONGE 4X4 16PLY XRAY LF (GAUZE/BANDAGES/DRESSINGS) ×1 IMPLANT
GEL ULTRASOUND 20GR AQUASONIC (MISCELLANEOUS) IMPLANT
GLOVE BIOGEL PI IND STRL 6.5 (GLOVE) ×2 IMPLANT
GLOVE BIOGEL PI IND STRL 7.0 (GLOVE) ×1 IMPLANT
GLOVE BIOGEL PI IND STRL 7.5 (GLOVE) ×1 IMPLANT
GLOVE BIOGEL PI INDICATOR 6.5 (GLOVE) ×2
GLOVE BIOGEL PI INDICATOR 7.0 (GLOVE) ×1
GLOVE BIOGEL PI INDICATOR 7.5 (GLOVE) ×1
GLOVE ECLIPSE 6.5 STRL STRAW (GLOVE) ×2 IMPLANT
GLOVE SS BIOGEL STRL SZ 7 (GLOVE) ×1 IMPLANT
GLOVE SS BIOGEL STRL SZ 7.5 (GLOVE) ×2 IMPLANT
GLOVE SUPERSENSE BIOGEL SZ 7 (GLOVE) ×1
GLOVE SUPERSENSE BIOGEL SZ 7.5 (GLOVE) ×1
GOWN STRL NON-REIN LRG LVL3 (GOWN DISPOSABLE) ×9 IMPLANT
GRAFT GORETEX STRETCH 8MX10CM (Vascular Products) ×2 IMPLANT
KIT BASIN OR (CUSTOM PROCEDURE TRAY) ×3 IMPLANT
KIT ROOM TURNOVER OR (KITS) ×3 IMPLANT
NDL 18GX1X1/2 (RX/OR ONLY) (NEEDLE) ×1 IMPLANT
NDL HYPO 25GX1X1/2 BEV (NEEDLE) ×1 IMPLANT
NEEDLE 18GX1X1/2 (RX/OR ONLY) (NEEDLE) IMPLANT
NEEDLE 22X1 1/2 (OR ONLY) (NEEDLE) ×3 IMPLANT
NEEDLE HYPO 25GX1X1/2 BEV (NEEDLE) ×3 IMPLANT
NS IRRIG 1000ML POUR BTL (IV SOLUTION) ×3 IMPLANT
PACK CV ACCESS (CUSTOM PROCEDURE TRAY) ×3 IMPLANT
PACK SURGICAL SETUP 50X90 (CUSTOM PROCEDURE TRAY) ×1 IMPLANT
PAD ARMBOARD 7.5X6 YLW CONV (MISCELLANEOUS) ×6 IMPLANT
SOAP 2 % CHG 4 OZ (WOUND CARE) ×1 IMPLANT
SPONGE GAUZE 2X2 STER 10/PKG (GAUZE/BANDAGES/DRESSINGS) ×1
SPONGE GAUZE 4X4 12PLY (GAUZE/BANDAGES/DRESSINGS) ×3 IMPLANT
STRIP CLOSURE SKIN 1/2X4 (GAUZE/BANDAGES/DRESSINGS) ×3 IMPLANT
SUT ETHILON 3 0 PS 1 (SUTURE) ×3 IMPLANT
SUT PROLENE 6 0 CC (SUTURE) ×6 IMPLANT
SUT SILK 2 0 FS (SUTURE) ×2 IMPLANT
SUT VIC AB 3-0 SH 27 (SUTURE) ×6
SUT VIC AB 3-0 SH 27X BRD (SUTURE) ×4 IMPLANT
SUT VICRYL 4-0 PS2 18IN ABS (SUTURE) ×3 IMPLANT
SYR 20CC LL (SYRINGE) ×3 IMPLANT
SYR 30ML LL (SYRINGE) IMPLANT
SYR 5ML LL (SYRINGE) ×2 IMPLANT
SYR CONTROL 10ML LL (SYRINGE) ×1 IMPLANT
SYR TB 1ML LUER SLIP (SYRINGE) ×2 IMPLANT
SYRINGE 10CC LL (SYRINGE) ×1 IMPLANT
TAPE CLOTH SURG 4X10 WHT LF (GAUZE/BANDAGES/DRESSINGS) ×2 IMPLANT
TOWEL OR 17X24 6PK STRL BLUE (TOWEL DISPOSABLE) ×5 IMPLANT
TOWEL OR 17X26 10 PK STRL BLUE (TOWEL DISPOSABLE) ×3 IMPLANT
UNDERPAD 30X30 INCONTINENT (UNDERPADS AND DIAPERS) ×3 IMPLANT
WATER STERILE IRR 1000ML POUR (IV SOLUTION) ×3 IMPLANT

## 2011-04-08 NOTE — Progress Notes (Signed)
   SUBJECTIVE:  No further abdominal pain.  No SOB   PHYSICAL EXAM Filed Vitals:   04/07/11 1300 04/07/11 1801 04/07/11 2047 04/08/11 0518  BP: 143/90 134/90 141/90 119/83  Pulse: 128 128 127 126  Temp: 98.2 F (36.8 C) 98.6 F (37 C) 98.7 F (37.1 C) 97.9 F (36.6 C)  TempSrc: Oral Oral Oral Oral  Resp: 17 17 18 18   Height:      Weight:   45 kg (99 lb 3.3 oz)   SpO2: 97% 95% 97% 96%   General:  No distress Lungs:  Clear Heart:  RRR Abdomen:  Positive bowel sounds, no rebound no guarding Extremities:  No edema  LABS: Lab Results  Component Value Date   TROPONINI <0.30 04/03/2011   Results for orders placed during the hospital encounter of 04/03/11 (from the past 24 hour(s))  CBC     Status: Abnormal   Collection Time   04/08/11  5:00 AM      Component Value Range   WBC 9.3  4.0 - 10.5 (K/uL)   RBC 4.23  3.87 - 5.11 (MIL/uL)   Hemoglobin 11.8 (*) 12.0 - 15.0 (g/dL)   HCT 37.1  36.0 - 46.0 (%)   MCV 87.7  78.0 - 100.0 (fL)   MCH 27.9  26.0 - 34.0 (pg)   MCHC 31.8  30.0 - 36.0 (g/dL)   RDW 17.8 (*) 11.5 - 15.5 (%)   Platelets 346  150 - 400 (K/uL)  BASIC METABOLIC PANEL     Status: Abnormal   Collection Time   04/08/11  5:00 AM      Component Value Range   Sodium 137  135 - 145 (mEq/L)   Potassium 3.9  3.5 - 5.1 (mEq/L)   Chloride 95 (*) 96 - 112 (mEq/L)   CO2 25  19 - 32 (mEq/L)   Glucose, Bld 85  70 - 99 (mg/dL)   BUN 46 (*) 6 - 23 (mg/dL)   Creatinine, Ser 6.71 (*) 0.50 - 1.10 (mg/dL)   Calcium 9.4  8.4 - 10.5 (mg/dL)   GFR calc non Af Amer 6 (*) >90 (mL/min)   GFR calc Af Amer 7 (*) >90 (mL/min)    Intake/Output Summary (Last 24 hours) at 04/08/11 1036 Last data filed at 04/07/11 2232  Gross per 24 hour  Intake    200 ml  Output    475 ml  Net   -275 ml    ASSESSMENT AND PLAN:  Tachycardia:  Telemetry reviewed.  Rate is 120s sinus.  Brief run of atrial tach. No sustained SVT.  I cannot find orthostatic BPs if they were done.  I will ask nursing.   Echo pending.  No change in therapy pending these results.  Murmur:  Echo pending.  Jeneen Rinks [facility] 04/08/2011 10:36 AM

## 2011-04-08 NOTE — H&P (View-Only) (Signed)
Patient ID: [name], female   DOB: 1950/06/23, 61 y.o.   MRN: [id number] Vascular Surgery Progress Note  Subjective: Needs vascular access-thrombosis right upper arm AV G. with multiple aneurysms of graft Probable lysis performed by IR on 04/05/2010 with inability remove all thrombus from aneurysms  Objective:  Filed Vitals:   04/07/11 0909  BP: 134/88  Pulse: 133  Temp: 98.2 F (36.8 C)  Resp: 16    General well-developed well-nourished female-elderly in no apparent stress alert and oriented x3 Lungs no rhonchi or wheezing Cardiovascular rhythm no murmurs HEENT normal for age Neurologic normal Abdomen soft nontender with no masses Skin free of rashes Musculoskeletal-free of major deformities Upper extremity exam on the right reveals a functioning AV graft in the upper arm up to the axillary vein. 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 These are pulsatile and there is a thrill in the graft. No access has ever been performed in the left upper extremity. 2+ radial pulses palpable distally.  Labs:  Lab 04/07/11 0500 04/06/11 0600 04/05/11 1720  CREATININE 4.99* 5.46* 7.71*    Lab 04/07/11 0500 04/06/11 0600 04/05/11 1720  NA 137 139 139  K 4.0 4.6 5.3*  CL 95* 94* 96  CO2 27 26 27   BUN 33* 31* 48*  CREATININE 4.99* 5.46* 7.71*  LABGLOM -- -- --  GLUCOSE 109* -- --  CALCIUM 9.4 9.8 9.9    Lab 04/07/11 0500 04/06/11 0600 04/05/11 1720  WBC 7.7 8.7 12.9*  HGB 12.4 11.9* 10.7*  HCT 39.8 37.7 34.1*  PLT 306 345 343    Lab 04/03/11 1716  INR 1.05    I/O last 3 completed shifts: In: 293 [P.O.:290; I.V.:3] Out: 3912 [Other:3362; Stool:550]  Imaging: Ir Pta Venous Right  04/05/2011  *RADIOLOGY REPORT*  Clinical history:End-stage renal disease and clotted right upper extremity graft  PROCEDURE(S): DIALYSIS GRAFT DECLOT; GRAFT/VENOUS PTA; ULTRASOUND GUIDANCE FOR VASCULAR ACCESS  Physician: Stephan Minister. Henn, MD  Medications:Heparin  3000 units, TPA 2 mg, Versed 2 mg, Fentanyl75 mcg. A radiology nurse monitored the patient for moderate sedation.  Moderate sedation time:65 minutes  Fluoroscopy time: 14.4 minutes  Contrast: 60 ml Omnipaque 300  Procedure:Informed consent was obtained for a declot procedure. The right upper arm was prepped and draped in a sterile fashion. Maximal barrier sterile technique was utilized including caps, mask, sterile gowns, sterile gloves, sterile drape, hand hygiene and skin antiseptic.  The skin was anesthetized with 1% lidocaine. 21 gauge needle was advanced into the lower aspect of the graft with ultrasound guidance.  Micropuncture dilator set was placed.  A Bentson wire was advanced into the outflow vein and a 6-French vascular sheath was placed.  5-French Kumpe catheter was advanced in the central veins and a central venogram was performed.  2 mg of TPA was placed within the graft using the 5-French Kumpe catheter. The graft was treated with the Argon Cleaner thrombectomy device. The graft was accessed towards arterial anastomosis using a 21 gauge needle with ultrasound guidance.  A 6-French vascular sheath was placed with a Bentson wire.  It was very difficult to advance a catheter or wire through graft aneurysms.  Eventually, the Kumpe catheter was advanced into the arterial system and the arterial plug was pulled using a Fogarty balloon.  There was flow within the graft.  The venous anastomosis was angioplastied to 6 mm x 40 mm Conquest balloon.  The follow-up shuntogram images were obtained. The venous outflow vein was  treated again with a 7 mm x 40 mm Conquest balloon.  Residual clot within the graft was treated with the thrombectomy device.  Vascular sheaths removed with pursestring sutures.  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  Stenosis in the right axillary region appears to be elastic. Residual narrowing in the right axillary region  despite balloon dilatation.  At the end of the procedure, there was flow in the graft but residual thrombus involving the aneurysms.  Impression:Successful declot of the right upperextremity graft.  Access management:  There are large aneurysms involving the right upper arm graft that make it very difficult to treat percutaneously.  Recommend surgical consultation if the graft occludes in the future.  Original Report Authenticated By: Markus Daft, M.D.   Ir Av Dialysis Graft Declot  04/05/2011  *RADIOLOGY REPORT*  Clinical history:End-stage renal disease and clotted right upper extremity graft  PROCEDURE(S): DIALYSIS GRAFT DECLOT; GRAFT/VENOUS PTA; ULTRASOUND GUIDANCE FOR VASCULAR ACCESS  Physician: Stephan Minister. Henn, MD  Medications:Heparin 3000 units, TPA 2 mg, Versed 2 mg, Fentanyl75 mcg. A radiology nurse monitored the patient for moderate sedation.  Moderate sedation time:65 minutes  Fluoroscopy time: 14.4 minutes  Contrast: 60 ml Omnipaque 300  Procedure:Informed consent was obtained for a declot procedure. The right upper arm was prepped and draped in a sterile fashion. Maximal barrier sterile technique was utilized including caps, mask, sterile gowns, sterile gloves, sterile drape, hand hygiene and skin antiseptic.  The skin was anesthetized with 1% lidocaine. 21 gauge needle was advanced into the lower aspect of the graft with ultrasound guidance.  Micropuncture dilator set was placed.  A Bentson wire was advanced into the outflow vein and a 6-French vascular sheath was placed.  5-French Kumpe catheter was advanced in the central veins and a central venogram was performed.  2 mg of TPA was placed within the graft using the 5-French Kumpe catheter. The graft was treated with the Argon Cleaner thrombectomy device. The graft was accessed towards arterial anastomosis using a 21 gauge needle with ultrasound guidance.  A 6-French vascular sheath was placed with a Bentson wire.  It was very difficult to advance a  catheter or wire through graft aneurysms.  Eventually, the Kumpe catheter was advanced into the arterial system and the arterial plug was pulled using a Fogarty balloon.  There was flow within the graft.  The venous anastomosis was angioplastied to 6 mm x 40 mm Conquest balloon.  The follow-up shuntogram images were obtained. The venous outflow vein was treated again with a 7 mm x 40 mm Conquest balloon.  Residual clot within the graft was treated with the thrombectomy device.  Vascular sheaths removed with pursestring sutures.  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  Stenosis in the right axillary region appears to be elastic. Residual narrowing in the right axillary region despite balloon dilatation.  At the end of the procedure, there was flow in the graft but residual thrombus involving the aneurysms.  Impression:Successful declot of the right upperextremity graft.  Access management:  There are large aneurysms involving the right upper arm graft that make it very difficult to treat percutaneously.  Recommend surgical consultation if the graft occludes in the future.  Original Report Authenticated By: Markus Daft, M.D.   Ir Angio Av Shunt Addl Access  04/05/2011  *RADIOLOGY REPORT*  Clinical history:End-stage renal disease and clotted right upper extremity graft  PROCEDURE(S): DIALYSIS GRAFT DECLOT; GRAFT/VENOUS PTA; ULTRASOUND GUIDANCE FOR  VASCULAR ACCESS  Physician: Stephan Minister. Henn, MD  Medications:Heparin 3000 units, TPA 2 mg, Versed 2 mg, Fentanyl75 mcg. A radiology nurse monitored the patient for moderate sedation.  Moderate sedation time:65 minutes  Fluoroscopy time: 14.4 minutes  Contrast: 60 ml Omnipaque 300  Procedure:Informed consent was obtained for a declot procedure. The right upper arm was prepped and draped in a sterile fashion. Maximal barrier sterile technique was utilized including caps, mask, sterile gowns, sterile gloves,  sterile drape, hand hygiene and skin antiseptic.  The skin was anesthetized with 1% lidocaine. 21 gauge needle was advanced into the lower aspect of the graft with ultrasound guidance.  Micropuncture dilator set was placed.  A Bentson wire was advanced into the outflow vein and a 6-French vascular sheath was placed.  5-French Kumpe catheter was advanced in the central veins and a central venogram was performed.  2 mg of TPA was placed within the graft using the 5-French Kumpe catheter. The graft was treated with the Argon Cleaner thrombectomy device. The graft was accessed towards arterial anastomosis using a 21 gauge needle with ultrasound guidance.  A 6-French vascular sheath was placed with a Bentson wire.  It was very difficult to advance a catheter or wire through graft aneurysms.  Eventually, the Kumpe catheter was advanced into the arterial system and the arterial plug was pulled using a Fogarty balloon.  There was flow within the graft.  The venous anastomosis was angioplastied to 6 mm x 40 mm Conquest balloon.  The follow-up shuntogram images were obtained. The venous outflow vein was treated again with a 7 mm x 40 mm Conquest balloon.  Residual clot within the graft was treated with the thrombectomy device.  Vascular sheaths removed with pursestring sutures.  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  Stenosis in the right axillary region appears to be elastic. Residual narrowing in the right axillary region despite balloon dilatation.  At the end of the procedure, there was flow in the graft but residual thrombus involving the aneurysms.  Impression:Successful declot of the right upperextremity graft.  Access management:  There are large aneurysms involving the right upper arm graft that make it very difficult to treat percutaneously.  Recommend surgical consultation if the graft occludes in the future.  Original Report Authenticated By:  Markus Daft, M.D.   Ir US Guide Vasc Access Right  04/05/2011  *RADIOLOGY REPORT*  Clinical history:End-stage renal disease and clotted right upper extremity graft  PROCEDURE(S): DIALYSIS GRAFT DECLOT; GRAFT/VENOUS PTA; ULTRASOUND GUIDANCE FOR VASCULAR ACCESS  Physician: Stephan Minister. Henn, MD  Medications:Heparin 3000 units, TPA 2 mg, Versed 2 mg, Fentanyl75 mcg. A radiology nurse monitored the patient for moderate sedation.  Moderate sedation time:65 minutes  Fluoroscopy time: 14.4 minutes  Contrast: 60 ml Omnipaque 300  Procedure:Informed consent was obtained for a declot procedure. The right upper arm was prepped and draped in a sterile fashion. Maximal barrier sterile technique was utilized including caps, mask, sterile gowns, sterile gloves, sterile drape, hand hygiene and skin antiseptic.  The skin was anesthetized with 1% lidocaine. 21 gauge needle was advanced into the lower aspect of the graft with ultrasound guidance.  Micropuncture dilator set was placed.  A Bentson wire was advanced into the outflow vein and a 6-French vascular sheath was placed.  5-French Kumpe catheter was advanced in the central veins and a central venogram was performed.  2 mg of TPA was placed within the graft using the 5-French Kumpe catheter. The graft  was treated with the Argon Cleaner thrombectomy device. The graft was accessed towards arterial anastomosis using a 21 gauge needle with ultrasound guidance.  A 6-French vascular sheath was placed with a Bentson wire.  It was very difficult to advance a catheter or wire through graft aneurysms.  Eventually, the Kumpe catheter was advanced into the arterial system and the arterial plug was pulled using a Fogarty balloon.  There was flow within the graft.  The venous anastomosis was angioplastied to 6 mm x 40 mm Conquest balloon.  The follow-up shuntogram images were obtained. The venous outflow vein was treated again with a 7 mm x 40 mm Conquest balloon.  Residual clot within the graft  was treated with the thrombectomy device.  Vascular sheaths removed with pursestring sutures.  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  Stenosis in the right axillary region appears to be elastic. Residual narrowing in the right axillary region despite balloon dilatation.  At the end of the procedure, there was flow in the graft but residual thrombus involving the aneurysms.  Impression:Successful declot of the right upperextremity graft.  Access management:  There are large aneurysms involving the right upper arm graft that make it very difficult to treat percutaneously.  Recommend surgical consultation if the graft occludes in the future.  Original Report Authenticated By: Markus Daft, M.D.    Assessment/Plan:    LOS: 4 days  s/p   Shuntogram from interventional radiology performed on 04/05/2010 was reviewed by me. There was incomplete removal of thrombus from the graft due to the pseudoaneurysms. A mild to moderate stenosis at the venous anastomosis was dilated.   Plan Will probably insert new graft in right upper arm around existing graft and insert a dialysis catheter. Doubt revision of graft will be feasible with 2 large pseudoaneurysms involving majority of the area where puncture sites are located. We'll plan to proceed tomorrow with this. Discussed with patient and she agrees   Tinnie Gens, MD 04/07/2011 11:04 AM

## 2011-04-08 NOTE — Interval H&P Note (Signed)
History and Physical Interval Note:  04/08/2011 2:26 PM  [name]  has presented today for surgery, with the diagnosis of renal disease  The various methods of treatment have been discussed with the patient and family. After consideration of risks, benefits and other options for treatment, the patient has consented to  Procedure(s): INSERTION OF ARTERIOVENOUS (AV) GORE-TEX GRAFT ARM INSERTION OF DIALYSIS CATHETER as a surgical intervention .  The patients' history has been reviewed, patient examined, no change in status, stable for surgery.  I have reviewed the patients' chart and labs.  Questions were answered to the patient's satisfaction.     Rosetta Posner

## 2011-04-08 NOTE — Progress Notes (Signed)
*  PRELIMINARY RESULTS* Echocardiogram 2D Echocardiogram has been performed.  Roxine Caddy [facility] 04/08/2011, 12:54 PM

## 2011-04-08 NOTE — Progress Notes (Signed)
04-08-11 UR completed. Magdalen Spatz RN BSN

## 2011-04-08 NOTE — Transfer of Care (Signed)
Immediate Anesthesia Transfer of Care Note  Patient: [name]  Procedure(s) Performed:  INSERTION OF ARTERIOVENOUS (AV) GORE-TEX GRAFT ARM - Revision of Upper Arm Gore-Tex Graft  Patient Location: PACU  Anesthesia Type: General  Level of Consciousness: sedated  Airway & Oxygen Therapy: Patient Spontanous Breathing and Patient connected to nasal cannula oxygen  Post-op Assessment: Report given to PACU RN and Post -op Vital signs reviewed and stable  Post vital signs: Reviewed and stable  Complications: No apparent anesthesia complications

## 2011-04-08 NOTE — Anesthesia Procedure Notes (Signed)
Procedure Name: LMA Insertion Date/Time: 04/08/2011 3:01 PM Performed by: Marinda Elk Pre-anesthesia Checklist: Patient identified, Timeout performed, Emergency Drugs available, Patient being monitored and Suction available Patient Re-evaluated:Patient Re-evaluated prior to inductionOxygen Delivery Method: Circle System Utilized Preoxygenation: Pre-oxygenation with 100% oxygen Intubation Type: IV induction LMA: LMA inserted LMA Size: 4.0 Number of attempts: 1 Placement Confirmation: positive ETCO2 and breath sounds checked- equal and bilateral Tube secured with: Tape Dental Injury: Teeth and Oropharynx as per pre-operative assessment

## 2011-04-08 NOTE — Anesthesia Preprocedure Evaluation (Addendum)
Anesthesia Evaluation  Patient identified by MRN, date of birth, ID band Patient awake    Reviewed: Allergy & Precautions, H&P , NPO status , Patient's Chart, lab work & pertinent test results  History of Anesthesia Complications (+) AWARENESS UNDER ANESTHESIA  Airway Mallampati: II TM Distance: >3 FB Neck ROM: Full    Dental No notable dental hx. (+) Teeth Intact   Pulmonary neg pulmonary ROS, former smoker clear to auscultation  Pulmonary exam normal       Cardiovascular hypertension, On Medications + Valvular Problems/Murmurs Regular Normal+ Systolic murmurs    Neuro/Psych Seizures -,   Neuromuscular disease Negative Psych ROS   GI/Hepatic negative GI ROS, Neg liver ROS, (+) Hepatitis -, C  Endo/Other  Negative Endocrine ROSDiabetes mellitus-  Renal/GU ESRF and DialysisRenal disease  Genitourinary negative   Musculoskeletal   Abdominal   Peds  Hematology negative hematology ROS (+)   Anesthesia Other Findings   Reproductive/Obstetrics negative OB ROS                           Anesthesia Physical Anesthesia Plan  ASA: III  Anesthesia Plan: General   Post-op Pain Management:    Induction: Intravenous  Airway Management Planned: LMA  Additional Equipment:   Intra-op Plan:   Post-operative Plan: Extubation in OR  Informed Consent: I have reviewed the patients History and Physical, chart, labs and discussed the procedure including the risks, benefits and alternatives for the proposed anesthesia with the patient or authorized representative who has indicated his/her understanding and acceptance.     Plan Discussed with: CRNA  Anesthesia Plan Comments:         Anesthesia Quick Evaluation

## 2011-04-08 NOTE — Progress Notes (Addendum)
Subjective: No acute events overnight.  Patient reports that her abdominal pain is improved.  She complains of burning pain in her feet bilaterally, worse in the left.  Objective: Vital signs in last 24 hours: Filed Vitals:   04/08/11 1100 04/08/11 1101 04/08/11 1102 04/08/11 1107  BP: 133/84 133/84 122/90 143/102  Pulse: 98 98 102 102  Temp:  98.1 F (36.7 C)    TempSrc:  Oral    Resp:  18    Height:      Weight:      SpO2:       Weight change: -3 kg (-6 lb 9.8 oz)  Intake/Output Summary (Last 24 hours) at 04/08/11 0842 Last data filed at 04/07/11 2232  Gross per 24 hour  Intake    440 ml  Output    475 ml  Net    -35 ml   Physical Exam: BP 119/83  Pulse 126  Temp(Src) 97.9 F (36.6 C) (Oral)  Resp 18  Ht 5\' 5"  (1.651 m)  Wt 45 kg (99 lb 3.3 oz)  BMI 16.51 kg/m2  SpO2 96% GEN: NAD, sitting up in bed, awake and alert  HEENT: PERRL, EOMI, dry mucus membranes. No erythema or exudate in oropharynx.  Lungs: Normal respiratory effort. Clear to auscultation BL without crackles or wheezes.  Heart: tachycardic. Regular rhythm. 4/6 systolic murmur auscultated throughout precordium, does not radiate to carotids.  Abdomen: BS normoactive. Soft, Nondistended, non-tender to palpation in all 4 quadrants. No masses or organomegaly. Iliostomy present in RLQ with yellow output. No surrounding erythema or Exudate. Clean, dry, intact.  Rectal: Poor rectal tone.  No stool, blood, or other material in rectal vault.   Extremities: No pretibial edema. Renal access on R upper arm flow: + good thrills with auscultation.  Neurologic: A&Ox2 (at her baseline). nonfocal   Lab Results: Basic Metabolic Panel:  Lab [id number] 0500 04/07/11 0500 04/06/11 0600  NA 137 137 --  K 3.9 4.0 --  CL 95* 95* --  CO2 25 27 --  GLUCOSE 85 109* --  BUN 46* 33* --  CREATININE 6.71* 4.99* --  CALCIUM 9.4 9.4 --  MG -- -- --  PHOS -- 2.9 4.5   Liver Function Tests:  Lab 04/07/11 0500 04/06/11 0600  04/03/11 1716  AST -- -- 31  ALT -- -- 15  ALKPHOS -- -- 119*  BILITOT -- -- 0.3  PROT -- -- 8.4*  ALBUMIN 3.5 3.4* --   Lab 04/03/11 1717  AMMONIA 17   CBC:  Lab 04/08/11 0500 04/07/11 0500 04/04/11 0640 04/03/11 1716  WBC 9.3 7.7 -- --  NEUTROABS -- -- 3.5 4.1  HGB 11.8* 12.4 -- --  HCT 37.1 39.8 -- --  MCV 87.7 89.0 -- --  PLT 346 306 -- --   Cardiac Enzymes:  Lab 04/03/11 1721  CKTOTAL --  CKMB --  CKMBINDEX --  TROPONINI <0.30   BNP:  Lab 04/03/11 1720  PROBNP 2300.0*   Hemoglobin A1C:  Lab 04/04/11 0535  HGBA1C 4.3   Thyroid Function Tests:  Lab 04/04/11 0535  TSH 1.091  T4TOTAL --  FREET4 --  T3FREE --  THYROIDAB --   Coagulation:  Lab 04/03/11 1716  LABPROT 13.9  INR 1.05    Micro Results: Recent Results (from the past 240 hour(s))  MRSA PCR SCREENING     Status: Normal   Collection Time   04/03/11 10:38 PM      Component Value Range Status Comment  MRSA by PCR NEGATIVE  NEGATIVE  Final   CLOSTRIDIUM DIFFICILE BY PCR     Status: Normal   Collection Time   04/04/11 12:04 AM      Component Value Range Status Comment   C difficile by pcr NEGATIVE  NEGATIVE  Final    Studies/Results: Ct Abdomen Pelvis Wo Contrast  04/07/2011  *RADIOLOGY REPORT*  Clinical Data: Lower abdominal pain, history of ischemic colitis, hepatitis C, prior nephrectomy, appendectomy, and colostomy  CT ABDOMEN AND PELVIS WITHOUT CONTRAST  Technique:  Multidetector CT imaging of the abdomen and pelvis was performed following the standard protocol without intravenous contrast.  Comparison: 03/15/2011  Findings: Mild linear scarring at the left lung base.  Unenhanced liver, spleen, pancreas, and adrenal glands are within normal limits.  Again seen is mild gallbladder wall thickening. No definite associated inflammatory changes by CT.  Status post right nephrectomy.  Numerous cysts of varying sizes/complexity in the left kidney.  No hydronephrosis.  No evidence of bowel  obstruction.  Right lower quadrant ileostomy. Hartmann's pouch.  Atherosclerotic calcifications of the abdominal aorta and branch vessels.  Interval removal of prior right abdominal pigtail drain.  Minimal residual stranding posterior to the liver (series 2/image 24), without drainable fluid collection or abscess.  No suspicious abdominopelvic lymphadenopathy.  Uterus is unremarkable.  No adnexal masses.  Bladder is decompressed.  Mild postsurgical changes in the midline anterior abdominal wall (series 2/image 41).  Degenerative changes of the visualized thoracolumbar spine.  Prior posterior fixation with interbody fusion from the L4-S1, with stable alignment.  IMPRESSION: Interval removal of prior right abdominal pigtail drain.  Minimal residual soft tissue stranding without drainable fluid collection or abscess.  No evidence of bowel obstruction.  Right lower quadrant ileostomy. Hartmann's pouch.  Status post right nephrectomy.  Original Report Authenticated By: Julian Hy, M.D.   Medications: I have reviewed the patient's current medications. Scheduled Meds:   . cefUROXime (ZINACEF)  IV  1.5 g Intravenous On Call to OR  . ciprofloxacin  500 mg Oral Q breakfast  . darbepoetin  100 mcg Intravenous Q Sat-HD  . heparin  5,000 Units Subcutaneous Q8H  . multivitamin  1 tablet Oral QHS  . pantoprazole  40 mg Oral Q1200  . sodium chloride  3 mL Intravenous Q12H   Continuous Infusions:   . sodium chloride 50 mL/hr (04/05/11 1400)   PRN Meds:.sodium chloride, sodium chloride, sodium chloride, sodium chloride, acetaminophen, acetaminophen, alteplase, camphor-menthol, docusate sodium, feeding supplement (NEPRO CARB STEADY), fentaNYL, heparin, heparin, hydrOXYzine, lidocaine, lidocaine-prilocaine, midazolam, ondansetron (ZOFRAN) IV, ondansetron, pentafluoroprop-tetrafluoroeth, sodium chloride, sorbitol, zolpidem Assessment/Plan:  ##. Sinus tachycardia - Improved today, yesterday we d/c GI cocktail  (possible antichol influence?); but chronic at least since admission in 01/2011. Patient spiked to 160s for <28minute overnight two days ago. ECG taken shortly thereafter showed sinus tachycardia. Patient monitored on telemetry showing sinus tachycardia in 120-130 since that time. Cardiology consulted and reports that the one episode of HR of 160 was likely SVT.  They report that unless another cause of tachycardia is found, this can be followed with outpatient cardiology.  Due to her murmur, they recommended 2D echo.   - Hb wnl - TSH : 1.09 wnl  - TTE pending - Monitor on Telemetry  - HD will monitor fluid status closely   ##. ESRD on HD (Tues, Thurs, Sat on [employer]) -Vascular surgery is planning to perform new graft placement today. During declotting on 1/18, radiology found aneurysms with clot in the graft  and were not optimistic about its future use. During subsequent dialysis, clots were again found in her graft and the dialysis session was not successful. Nephrology suspects graft failure.  - Appreciate Renal's input.  -Pt to undergo HD after new access placed today  ##. abdominal pain- Unclear etiology. History per patient of one bowel movement per rectum on 1/19, the first since receiving ostomy in 12/12.  Clarified to be mucous like discharge. Rectal exam is unremarkable for stool and blood.  Ostomy output appears healthy and unchanged. CT abdomen pelvis without contrast performed yesterday showing no active signs of infection or abscess.  Abdominal pain is resolving today.  Abdomen is non-tender to palpation. - Continue protonix.  - Discontinue GI cocktail due to anticholinergic properties in the setting of tachycardia.   ##. Altered mental status -Resolved. Seems back to her baseline, A&O x2.  ##. Question of borderline diabetes - HbA1C : 4.3 , Blood glucose has been well controlled.   ##.Anemia of chronic disease - BL Hgb 8-10. Currently stable.  - Continue Aranesp per renal.    ##. History of ischemic colitis - stable. s/p partial colectomy of right hemicolon with ileostomy per Dr. Ninfa Linden. Patient continues to be on Cipro therapy until follow-up with Dr. Ninfa Linden. Ileostomy stump looks healthy without surrounding maceration of skin or friability. Wound care was consulted for ostomy mgmt.  - Continue ciprofloxacin once daily   ##. Lumbar stenosis s/p lumbar disc surgery in 11/12 - Wound has healed well. Patient denies back pain. Reports neuropathic leg and foot pain with bilateral foot drop post-op.  PT needed at home.  - Continue to hold percocet.  - Tylenol for pain.  - will need to consider safe med dispo plan for gabapentin at discharge   DVT PPx - SQ heparin   LOS: 5 days   Keturah Shavers 04/08/2011, 8:42 AM  PGY 1 Addendum: I have seen and examined the patient, and agree with MS IV's assessment and plan.

## 2011-04-08 NOTE — Op Note (Signed)
OPERATIVE REPORT  DATE OF SURGERY: 04/08/2011  PATIENT: [name], 61 y.o. female MRN: [id number]  DOB: April 09, 1950  PRE-OPERATIVE DIAGNOSIS: End-stage renal disease with poorly functioning right upper arm AV Gore-Tex graft  POST-OPERATIVE DIAGNOSIS:  Same  PROCEDURE: Back to the and revision of right upper arm AV graft with interposition Gore-Tex graft venous anastomosis  SURGEON:  Curt Jews, M.D.  PHYSICIAN ASSISTANT: Collins  ANESTHESIA:  Gen.  EBL: Less than 100 ml  Total I/O In: 150 [I.V.:150] Out: 30 [Blood:30]  BLOOD ADMINISTERED: None  DRAINS: None  SPECIMEN: None  COUNTS CORRECT:  YES  PLAN OF CARE: PACU   PATIENT DISPOSITION:  PACU - hemodynamically stable  PROCEDURE DETAILS: Patient was taken to the operating room and placed supine position where the area of the right arm was prepped and draped in the usual sterile fashion the patient had a recent thrombolyzes and angioplasty of the venous anastomosis. There was a pulse in the graft was some aneurysmal degeneration but no thrill. Vision was made over the venous anastomosis in the axilla. The vein to graft anastomosis was exposed. The graft was opened near the venous anastomosis and actually was flow in the graft this point. The vein was occluded above. There was no clot removed from the graft this was flushed with heparinized saline and reoccluded. There was thrombus from the vein proximally and good backbleeding 5 dilator would not pass through the venous anastomosis. The vein above this was dilated and of good caliber. The vein was exposed further proximally was occluded with a vascular clamp the vein was transected at this level and the old sclerotic portion of vein was removed. A new 8 mm Gore-Tex graft was brought onto the field and was spatulated and sewn into into the vein with a running 6-0 Prolene suture. The anastomosis was tested and the graft was flushed with heparinized saline and reoccluded. The  graft was then cut to appropriate length and sewn to the old graft with a running 6-0 Prolene suture. Clamps were removed and excellent thrill was noted. The wound irrigated with saline. Hemostasis was obtained with electrocautery. The wound was closed with 3-0 Vicryl in the subcutaneous and subcuticular tissue. Sterile dressing was applied and the patient was taken to the recovery room in stable condition   Curt Jews, M.D. 04/08/2011 4:23 PM

## 2011-04-08 NOTE — Anesthesia Postprocedure Evaluation (Signed)
  Anesthesia Post-op Note  Patient: [name]  Procedure(s) Performed:  INSERTION OF ARTERIOVENOUS (AV) GORE-TEX GRAFT ARM - Revision of Upper Arm Gore-Tex Graft  Patient Location: PACU  Anesthesia Type: General  Level of Consciousness: awake  Airway and Oxygen Therapy: Patient Spontanous Breathing  Post-op Pain: mild  Post-op Assessment: Post-op Vital signs reviewed, Patient's Cardiovascular Status Stable, Respiratory Function Stable, No signs of Nausea or vomiting and Pain level controlled  Post-op Vital Signs: stable  Complications: No apparent anesthesia complications

## 2011-04-08 NOTE — Progress Notes (Signed)
Subjective:  Little upset having problems with HD access causing NPO for surgery and needing something for burning in my foot!   Vital signs in last 24 hours: Filed Vitals:   04/07/11 1300 04/07/11 1801 04/07/11 2047 04/08/11 0518  BP: 143/90 134/90 141/90 119/83  Pulse: 128 128 127 126  Temp: 98.2 F (36.8 C) 98.6 F (37 C) 98.7 F (37.1 C) 97.9 F (36.6 C)  TempSrc: Oral Oral Oral Oral  Resp: 17 17 18 18   Height:      Weight:   45 kg (99 lb 3.3 oz)   SpO2: 97% 95% 97% 96%   Weight change: -3 kg (-6 lb 9.8 oz)  Intake/Output Summary (Last 24 hours) at 04/08/11 1010 Last data filed at 04/07/11 2232  Gross per 24 hour  Intake    200 ml  Output    475 ml  Net   -275 ml   Labs: Basic Metabolic Panel:  Lab [id number] 0500 04/07/11 0500 04/06/11 0600 04/05/11 1720  NA 137 137 139 --  K 3.9 4.0 4.6 --  CL 95* 95* 94* --  CO2 25 27 26  --  GLUCOSE 85 109* 106* --  BUN 46* 33* 31* --  CREATININE 6.71* 4.99* 5.46* --  CALCIUM 9.4 9.4 9.8 --  ALB -- -- -- --  PHOS -- 2.9 4.5 3.3   Liver Function Tests:  Lab 04/07/11 0500 04/06/11 0600 04/05/11 1720 04/03/11 1716  AST -- -- -- 31  ALT -- -- -- 15  ALKPHOS -- -- -- 119*  BILITOT -- -- -- 0.3  PROT -- -- -- 8.4*  ALBUMIN 3.5 3.4* 3.1* --   No results found for this basename: LIPASE:3,AMYLASE:3 in the last 168 hours  Lab 04/03/11 1717  AMMONIA 17   CBC:  Lab 04/08/11 0500 04/07/11 0500 04/06/11 0600 04/05/11 1720 04/04/11 0640 04/03/11 1716  WBC 9.3 7.7 8.7 -- -- --  NEUTROABS -- -- -- -- 3.5 4.1  HGB 11.8* 12.4 11.9* -- -- --  HCT 37.1 39.8 37.7 -- -- --  MCV 87.7 89.0 88.1 87.9 89.8 --  PLT 346 306 345 -- -- --   Cardiac Enzymes:  Lab 04/03/11 1721  CKTOTAL --  CKMB --  CKMBINDEX --  TROPONINI <0.30   CBG: No results found for this basename: GLUCAP:5 in the last 168 hours  Iron Studies: No results found for this basename: IRON,TIBC,TRANSFERRIN,FERRITIN in the last 72 hours Studies/Results: Ct Abdomen  Pelvis Wo Contrast  04/07/2011  *RADIOLOGY REPORT*  Clinical Data: Lower abdominal pain, history of ischemic colitis, hepatitis C, prior nephrectomy, appendectomy, and colostomy  CT ABDOMEN AND PELVIS WITHOUT CONTRAST  Technique:  Multidetector CT imaging of the abdomen and pelvis was performed following the standard protocol without intravenous contrast.  Comparison: 03/15/2011  Findings: Mild linear scarring at the left lung base.  Unenhanced liver, spleen, pancreas, and adrenal glands are within normal limits.  Again seen is mild gallbladder wall thickening. No definite associated inflammatory changes by CT.  Status post right nephrectomy.  Numerous cysts of varying sizes/complexity in the left kidney.  No hydronephrosis.  No evidence of bowel obstruction.  Right lower quadrant ileostomy. Hartmann's pouch.  Atherosclerotic calcifications of the abdominal aorta and branch vessels.  Interval removal of prior right abdominal pigtail drain.  Minimal residual stranding posterior to the liver (series 2/image 24), without drainable fluid collection or abscess.  No suspicious abdominopelvic lymphadenopathy.  Uterus is unremarkable.  No adnexal masses.  Bladder is decompressed.  Mild postsurgical changes in the midline anterior abdominal wall (series 2/image 41).  Degenerative changes of the visualized thoracolumbar spine.  Prior posterior fixation with interbody fusion from the L4-S1, with stable alignment.  IMPRESSION: Interval removal of prior right abdominal pigtail drain.  Minimal residual soft tissue stranding without drainable fluid collection or abscess.  No evidence of bowel obstruction.  Right lower quadrant ileostomy. Hartmann's pouch.  Status post right nephrectomy.  Original Report Authenticated By: Julian Hy, M.D.   Medications:    . sodium chloride 50 mL/hr (04/05/11 1400)      . cefUROXime (ZINACEF)  IV  1.5 g Intravenous On Call to OR  . ciprofloxacin  500 mg Oral Q breakfast  .  darbepoetin  100 mcg Intravenous Q Sat-HD  . heparin  5,000 Units Subcutaneous Q8H  . multivitamin  1 tablet Oral QHS  . pantoprazole  40 mg Oral Q1200  . sodium chloride  3 mL Intravenous Q12H    I  have reviewed scheduled and prn medications.  Physical Exam: General appearance: Alert, oriented x 3, in no apparent distress  Resp: CTA bilaterally  Cardio: Tachycardic with Gr II/IV systolic murmur  GI: + BS, soft and nontender, RLQ ileostomy  Extremities: No edema  Access: AVG @ RUA; +T/B  Problem/Plan: 1. AMS - believed to be secondary to medications or hypercalcemia (remains 9.4, and resolved); now @ baseline 2. ESRD - on HD TTS [place], plan HD tomorrow 3. Vascular access - s/p declot of AVG on 1/18; multiple clots were removed from access last HD treatment and NPO after MN for access revision today by [facility].  4. Anemia - Hgb 11.8; on Aranesp 100 mcg qwk; follow trend  5. HTN/Volume - BP fairly well controlled, no signs or symptoms of fluid overload; may need med for rate control which will optimize BP control; however, prior history tobacco abuse; ? Underlying bronchospastic disease and will defer to cardiology.  6. Secondary hyperparathyroidism - Ca 9.4, P 2.9, Calcium-based binders held, no Zemplar.  7. Ischemic colitis - s/p right hemicolectomy and ileostomy; abd pain OTW; remains on Cipro (recent abscess); GI following; currently denies pain.  8. Tachycardia - remains with HR 124-128; asymptomatic; Echo pending; Cardiology following 9. Peripheral neuropathy- unable to tolerate neurontin; never tried Lyrica; trial 25mg  qhs prn while still inpt and can titrate dose; monitor mental status closely 10. Disposition- per primary services; unclear if further GI workup; suspect soon; following access revision OK renal standpoint  Marni Griffon, FNP-C [place] Pager [phone]  04/08/2011,10:10 AM  LOS: 5 days     I have seen and examined this patient and agree with plan .   Pt at baseline as for mental status goes. Hypercalcemia resolved.  For access procedure tomorrow and HD.  ? DC tomorrow after HD. Oumar Marcott T,MD 04/08/2011 10:59 AM

## 2011-04-09 ENCOUNTER — Inpatient Hospital Stay (HOSPITAL_COMMUNITY): Payer: Medicare Other

## 2011-04-09 ENCOUNTER — Other Ambulatory Visit: Payer: Self-pay

## 2011-04-09 ENCOUNTER — Encounter (HOSPITAL_COMMUNITY): Payer: Self-pay | Admitting: Vascular Surgery

## 2011-04-09 DIAGNOSIS — I498 Other specified cardiac arrhythmias: Secondary | ICD-10-CM

## 2011-04-09 LAB — BASIC METABOLIC PANEL
BUN: 22 mg/dL (ref 6–23)
Calcium: 9.5 mg/dL (ref 8.4–10.5)
Creatinine, Ser: 4.38 mg/dL — ABNORMAL HIGH (ref 0.50–1.10)
GFR calc Af Amer: 12 mL/min — ABNORMAL LOW (ref >90)
GFR calc non Af Amer: 10 mL/min — ABNORMAL LOW (ref >90)
Glucose, Bld: 124 mg/dL — ABNORMAL HIGH (ref 70–99)
Potassium: 4.5 mEq/L (ref 3.5–5.1)

## 2011-04-09 LAB — RENAL FUNCTION PANEL
Albumin: 3.2 g/dL — ABNORMAL LOW (ref 3.5–5.2)
BUN: 58 mg/dL — ABNORMAL HIGH (ref 6–23)
Chloride: 95 mEq/L — ABNORMAL LOW (ref 96–112)
Creatinine, Ser: 8.14 mg/dL — ABNORMAL HIGH (ref 0.50–1.10)
GFR calc non Af Amer: 5 mL/min — ABNORMAL LOW (ref >90)
Glucose, Bld: 122 mg/dL — ABNORMAL HIGH (ref 70–99)
Potassium: 3.8 mEq/L (ref 3.5–5.1)

## 2011-04-09 LAB — CARDIAC PANEL(CRET KIN+CKTOT+MB+TROPI)
CK, MB: 1.8 ng/mL (ref 0.3–4.0)
Relative Index: INVALID (ref 0.0–2.5)
Troponin I: <0.3 ng/mL (ref <0.30)
Troponin I: <0.3 ng/mL (ref <0.30)

## 2011-04-09 LAB — CBC
HCT: 33.1 % — ABNORMAL LOW (ref 36.0–46.0)
MCH: 28.3 pg (ref 26.0–34.0)
MCV: 86 fL (ref 78.0–100.0)
Platelets: 319 10*3/uL (ref 150–400)
RBC: 3.85 MIL/uL — ABNORMAL LOW (ref 3.87–5.11)
WBC: 8.5 10*3/uL (ref 4.0–10.5)

## 2011-04-09 MED ORDER — PREGABALIN 25 MG PO CAPS: 25.0000 mg | ORAL_CAPSULE | Freq: Every evening | ORAL | Status: DC | PRN

## 2011-04-09 MED ORDER — METOPROLOL SUCCINATE 12.5 MG HALF TABLET: 12.5000 mg | ORAL_TABLET | Freq: Every day | ORAL | Status: DC

## 2011-04-09 MED ORDER — METOPROLOL SUCCINATE 12.5 MG HALF TABLET
12.5000 mg | ORAL_TABLET | Freq: Every day | ORAL | Status: DC
  Administered 2011-04-10: 12.5 mg via ORAL
  Filled 2011-04-09 (×2): qty 1

## 2011-04-09 MED ORDER — GABAPENTIN 300 MG PO CAPS
300.0000 mg | ORAL_CAPSULE | Freq: Every day | ORAL | Status: DC
  Administered 2011-04-10: 300 mg via ORAL
  Filled 2011-04-09 (×2): qty 1

## 2011-04-09 MED ORDER — NITROGLYCERIN 0.4 MG SL SUBL
0.4000 mg | SUBLINGUAL_TABLET | SUBLINGUAL | Status: DC | PRN
  Administered 2011-04-09: 0.4 mg via SUBLINGUAL

## 2011-04-09 MED ORDER — NITROGLYCERIN 0.4 MG SL SUBL
SUBLINGUAL_TABLET | SUBLINGUAL | Status: AC
  Administered 2011-04-09: 0.4 mg via SUBLINGUAL
  Filled 2011-04-09: qty 25

## 2011-04-09 MED ORDER — GABAPENTIN 600 MG PO TABS
300.0000 mg | ORAL_TABLET | Freq: Every day | ORAL | Status: DC
  Filled 2011-04-09: qty 0.5

## 2011-04-09 MED FILL — Darbepoetin Alfa-Polysorbate 80 Soln Inj 100 MCG/0.5ML: INTRAMUSCULAR | Qty: 0.5 | Status: AC

## 2011-04-09 NOTE — Progress Notes (Signed)
Subjective: No acute events overnight.  Patient reports continued burning foot pain bilaterally, but denies abdominal pain, chest pain, palpitations.  Patient had successful AV graft revision with vascular surgery yesterday.  Had hemodialysis today.  Objective: Vital signs in last 24 hours: Filed Vitals:   04/09/11 1115 04/09/11 1130 04/09/11 1135 04/09/11 1316  BP: 91/59 71/49 108/63 92/49  Pulse: 128 129 133   Temp:   96.7 F (35.9 C)   TempSrc:   Oral   Resp: 13  24   Height:      Weight:      SpO2:   93%    Weight change:   Intake/Output Summary (Last 24 hours) at 04/09/11 1400 Last data filed at 04/09/11 1135  Gross per 24 hour  Intake    150 ml  Output   1080 ml  Net   -930 ml   Physical Exam: BP 92/49  Pulse 133  Temp(Src) 96.7 F (35.9 C) (Oral)  Resp 24  Ht 5\' 5"  (1.651 m)  Wt 47.1 kg (103 lb 13.4 oz)  BMI 17.28 kg/m2  SpO2 93% GEN: NAD, lying in bed getting dialysis, awake and alert  HEENT: PERRL, EOMI, dry mucus membranes. No erythema or exudate in oropharynx.  Lungs: Normal respiratory effort. Clear to auscultation BL without crackles or wheezes.  Heart: tachycardic. Regular rhythm. 4/6 systolic murmur auscultated throughout precordium, does not radiate to carotids but can be heard in abdomen.  Abdominal thrill present.  Abdomen: BS normoactive. Soft, Nondistended, non-tender to palpation in all 4 quadrants. No masses or organomegaly. Iliostomy present in RLQ with yellow output. No surrounding erythema or Exudate. Clean, dry, intact.  Rectal: Poor rectal tone. No stool, blood, or other material in rectal vault.  Extremities: No pretibial edema. Renal access on R upper arm flow: + good thrills with auscultation, surgical incision closed dry and intact.  Neurologic: A&Ox2 (at her baseline). nonfocal  Lab Results: Basic Metabolic Panel:  Lab [id number] 0545 04/08/11 0500 04/07/11 0500  NA 137 137 --  K 3.8 3.9 --  CL 95* 95* --  CO2 24 25 --  GLUCOSE 122*  85 --  BUN 58* 46* --  CREATININE 8.14* 6.71* --  CALCIUM 9.1 9.4 --  MG -- -- --  PHOS 2.9 -- 2.9   Liver Function Tests:  Lab 04/09/11 0545 04/07/11 0500 04/03/11 1716  AST -- -- 31  ALT -- -- 15  ALKPHOS -- -- 119*  BILITOT -- -- 0.3  PROT -- -- 8.4*  ALBUMIN 3.2* 3.5 --    Lab 04/03/11 1717  AMMONIA 17   CBC:  Lab 04/09/11 0856 04/08/11 0500 04/04/11 0640 04/03/11 1716  WBC 8.5 9.3 -- --  NEUTROABS -- -- 3.5 4.1  HGB 10.9* 11.8* -- --  HCT 33.1* 37.1 -- --  MCV 86.0 87.7 -- --  PLT 319 346 -- --   Cardiac Enzymes:  Lab 04/09/11 1234 04/03/11 1721  CKTOTAL 19 --  CKMB 2.1 --  CKMBINDEX -- --  TROPONINI <0.30 <0.30   Micro Results: Recent Results (from the past 240 hour(s))  MRSA PCR SCREENING     Status: Normal   Collection Time   04/03/11 10:38 PM      Component Value Range Status Comment   MRSA by PCR NEGATIVE  NEGATIVE  Final   CLOSTRIDIUM DIFFICILE BY PCR     Status: Normal   Collection Time   04/04/11 12:04 AM      Component Value Range Status  Comment   C difficile by pcr NEGATIVE  NEGATIVE  Final   CULTURE, BLOOD (ROUTINE X 2)     Status: Normal (Preliminary result)   Collection Time   04/07/11  3:30 PM      Component Value Range Status Comment   Specimen Description BLOOD LEFT ARM   Final    Special Requests BOTTLES DRAWN AEROBIC AND ANAEROBIC 10CC   Final    Setup Time [id number]   Final    Culture     Final    Value:        BLOOD CULTURE RECEIVED NO GROWTH TO DATE CULTURE WILL BE HELD FOR 5 DAYS BEFORE ISSUING A FINAL NEGATIVE REPORT   Report Status PENDING   Incomplete   CULTURE, BLOOD (ROUTINE X 2)     Status: Normal (Preliminary result)   Collection Time   04/07/11  3:50 PM      Component Value Range Status Comment   Specimen Description BLOOD LEFT HAND   Final    Special Requests BOTTLES DRAWN AEROBIC AND ANAEROBIC 10CC   Final    Setup Time [id number]   Final    Culture     Final    Value:        BLOOD CULTURE RECEIVED NO GROWTH TO  DATE CULTURE WILL BE HELD FOR 5 DAYS BEFORE ISSUING A FINAL NEGATIVE REPORT   Report Status PENDING   Incomplete    Studies/Results: Ct Abdomen Pelvis Wo Contrast  04/07/2011  *RADIOLOGY REPORT*  Clinical Data: Lower abdominal pain, history of ischemic colitis, hepatitis C, prior nephrectomy, appendectomy, and colostomy  CT ABDOMEN AND PELVIS WITHOUT CONTRAST  Technique:  Multidetector CT imaging of the abdomen and pelvis was performed following the standard protocol without intravenous contrast.  Comparison: 03/15/2011  Findings: Mild linear scarring at the left lung base.  Unenhanced liver, spleen, pancreas, and adrenal glands are within normal limits.  Again seen is mild gallbladder wall thickening. No definite associated inflammatory changes by CT.  Status post right nephrectomy.  Numerous cysts of varying sizes/complexity in the left kidney.  No hydronephrosis.  No evidence of bowel obstruction.  Right lower quadrant ileostomy. Hartmann's pouch.  Atherosclerotic calcifications of the abdominal aorta and branch vessels.  Interval removal of prior right abdominal pigtail drain.  Minimal residual stranding posterior to the liver (series 2/image 24), without drainable fluid collection or abscess.  No suspicious abdominopelvic lymphadenopathy.  Uterus is unremarkable.  No adnexal masses.  Bladder is decompressed.  Mild postsurgical changes in the midline anterior abdominal wall (series 2/image 41).  Degenerative changes of the visualized thoracolumbar spine.  Prior posterior fixation with interbody fusion from the L4-S1, with stable alignment.  IMPRESSION: Interval removal of prior right abdominal pigtail drain.  Minimal residual soft tissue stranding without drainable fluid collection or abscess.  No evidence of bowel obstruction.  Right lower quadrant ileostomy. Hartmann's pouch.  Status post right nephrectomy.  Original Report Authenticated By: Julian Hy, M.D.   Medications: I have reviewed the  patient's current medications. Scheduled Meds:   . cefUROXime (ZINACEF)  IV  1.5 g Intravenous On Call to OR  . darbepoetin  100 mcg Intravenous Q Sat-HD  . gabapentin  300 mg Oral Daily  . heparin  5,000 Units Subcutaneous Q8H  . HYDROmorphone      . metoprolol succinate  12.5 mg Oral Daily  . multivitamin  1 tablet Oral QHS  . pantoprazole  40 mg Oral Q1200  . sodium chloride  3  mL Intravenous Q12H  . DISCONTD: ciprofloxacin  500 mg Oral Q breakfast  . DISCONTD: gabapentin  300 mg Oral Daily   Continuous Infusions:   . sodium chloride 20 mL (04/08/11 1420)   PRN Meds:.sodium chloride, sodium chloride, sodium chloride, sodium chloride, acetaminophen, acetaminophen, alteplase, camphor-menthol, docusate sodium, feeding supplement (NEPRO CARB STEADY), fentaNYL, heparin, heparin, heparin, HYDROmorphone, hydrOXYzine, lidocaine, lidocaine-prilocaine, midazolam, nitroGLYCERIN, ondansetron (ZOFRAN) IV, ondansetron, oxyCODONE, pentafluoroprop-tetrafluoroeth, pregabalin, sodium chloride, sorbitol, zolpidem DISCONTD: 0.9 % irrigation (POUR BTL), DISCONTD: heparin 6000 unit irrigation, DISCONTD: HYDROmorphone, DISCONTD: meperidine, DISCONTD: promethazine  Assessment/Plan: ##. Sinus tachycardia - Patient has been persistently in sinus tachycardia at least since her last admission starting in 11/12.  Has had non-sustained episodes of SVT this admission.  The patient is asymptomatic to her tachycardia and the SVT.  Echo yesterday showed moderate aortic stenosis that is unlikely to be causing her tachycardia.  Cardiology recommended starting a low dose of Beta-blocker, BP allowing, and close follow up with them.  She has persistently been having pain/burning feet, which may be contributing.  Hb wnl. TSH wnl.  - Restart Gabapentin - Start Metoprolol XR 12.5mg  PO daily - Monitor on Telemetry  - HD will monitor fluid status closely   ##. ESRD on HD (Tues, Thurs, Sat on [employer]) -Graft revision with  vascular surgery successful yesterday following graft failure due to clot and aneurysms.  Patient underwent successful dialysis this morning with new graft.    - Appreciate Renal's input.  - Resume outpatient dialysis upon discharge.  ##. abdominal pain- Resolved. Abdomen is non-tender to palpation. Unclear etiology. History per patient of one bowel movement per rectum on 1/19, the first since receiving ostomy in 12/12. Clarified to be mucous like discharge. Rectal exam is unremarkable for stool and blood. Ostomy output appears healthy and unchanged. CT abdomen pelvis without contrast performed showing no active signs of infection or abscess. - Continue protonix.  - Discontinue Ciprofloxacin per GI surgeon.  Has follow up appointment.  - [place] abd to eval thrill   ##. History of ischemic colitis - stable. s/p partial colectomy of right hemicolon with ileostomy per Dr. Ninfa Linden. Patient continues to be on Cipro therapy since 03/11/11. Discussed this with Dr. Trevor Mace office as patient did not have follow up as desired after her last discharge, at which time she was to stop ciprofloxacin.  Dr. Trevor Mace office recommended that she stop cipro and follow up with them in 2 weeks.  Appt is made.  Ileostomy stump looks healthy without surrounding maceration of skin or friability. Wound care was consulted for ostomy mgmt.  - Stop cipro therapy - f/u with Dr. Ninfa Linden as outpatient  ##. Lumbar stenosis s/p lumbar disc surgery in 11/12 - Wound has healed well. Patient denies back pain. Reports neuropathic leg and foot pain with bilateral foot drop post-op. PT needed at home.  - Continue to hold percocet.  - Tylenol for pain.  - Start gabapentin 300mg  PO daily and council family to closely follow mental status on this medication after discharge.  ##. Altered mental status -Resolved. Seems back to her baseline, A&O x2.   ##. Question of borderline diabetes - HbA1C : 4.3 , Blood glucose has been well  controlled.   ##.Anemia of chronic disease - BL Hgb 8-11. Currently stable.  - Continue Aranesp per renal.   DVT PPx - SQ heparin   LOS: 6 days   Keturah Shavers 04/09/2011, 2:00 PM  PGY 1 Addendum.  I have seen and examined patient,  reviewed labs and meds and agree with above assessment and plan.

## 2011-04-09 NOTE — Procedures (Signed)
Pt seen on HD. Ap 170  Vp 200.  Remains in sinus tach.  Plan for event monitor as out pt.  Will place on low dose b blocker

## 2011-04-09 NOTE — Progress Notes (Signed)
04/09/2011 12:37 PM CTSP re: CP, elevated HR.  Pt about halfway through dialysis when she had hypotension with SBP in the 70s and one reading in the 50s. Fluid was used and her BP improved but she developed chest pain. Since her BP was higher, she was given NTG x1 with relief.   Pt currently c/o bilateral LE pain ankles and feet - almost cramping but not quite. She remembers the chest pain but not very able to describe it further. She has not had chest pain before with HD but had other problems during last episode.   Her ECG was tachycardic - not new - but HR higher now than pre-HD. There were no new ischemic changes.  Pt feels that her dry weight is too dry. She only had 1 liter removed today with additional fluid given after her BP dropped. HD was stopped 36min early at pt request.  Cardiac enzymes were ordered and are pending.  She had Toprol XL 12.5 added to meds but has not rec'd a dose yet.  Plan: continue to cycle enzymes and make sure BB given. Check electrolytes in 2 hr after time for equilibration. Rx symptoms PRN. MD advise if any further workup needed (if enzymes are negative).

## 2011-04-09 NOTE — Progress Notes (Signed)
Subjective:  Feels great this am and thinks probably will be discharged today  Vital signs in last 24 hours: Filed Vitals:   04/08/11 1730 04/08/11 1804 04/08/11 2134 04/09/11 0526  BP:  106/85 94/71 114/68  Pulse: 107 102 116 114  Temp: 98.7 F (37.1 C) 97.5 F (36.4 C) 97.9 F (36.6 C) 98.4 F (36.9 C)  TempSrc:  Oral Oral Oral  Resp: 19 18 20 18   Height:      Weight:      SpO2: 100% 100% 94% 97%   Weight change:   Intake/Output Summary (Last 24 hours) at 04/09/11 0857 Last data filed at 04/08/11 1615  Gross per 24 hour  Intake    150 ml  Output     30 ml  Net    120 ml   Labs: Basic Metabolic Panel:  Lab [id number] 0545 04/08/11 0500 04/07/11 0500 04/06/11 0600  NA 137 137 137 --  K 3.8 3.9 4.0 --  CL 95* 95* 95* --  CO2 24 25 27  --  GLUCOSE 122* 85 109* --  BUN 58* 46* 33* --  CREATININE 8.14* 6.71* 4.99* --  CALCIUM 9.1 9.4 9.4 --  ALB -- -- -- --  PHOS 2.9 -- 2.9 4.5   Liver Function Tests:  Lab 04/09/11 0545 04/07/11 0500 04/06/11 0600 04/03/11 1716  AST -- -- -- 31  ALT -- -- -- 15  ALKPHOS -- -- -- 119*  BILITOT -- -- -- 0.3  PROT -- -- -- 8.4*  ALBUMIN 3.2* 3.5 3.4* --   No results found for this basename: LIPASE:3,AMYLASE:3 in the last 168 hours  Lab 04/03/11 1717  AMMONIA 17   CBC:  Lab 04/08/11 0500 04/07/11 0500 04/06/11 0600 04/05/11 1720 04/04/11 0640 04/03/11 1716  WBC 9.3 7.7 8.7 -- -- --  NEUTROABS -- -- -- -- 3.5 4.1  HGB 11.8* 12.4 11.9* -- -- --  HCT 37.1 39.8 37.7 -- -- --  MCV 87.7 89.0 88.1 87.9 89.8 --  PLT 346 306 345 -- -- --   Cardiac Enzymes:  Lab 04/03/11 1721  CKTOTAL --  CKMB --  CKMBINDEX --  TROPONINI <0.30   CBG: No results found for this basename: GLUCAP:5 in the last 168 hours  Iron Studies: No results found for this basename: IRON,TIBC,TRANSFERRIN,FERRITIN in the last 72 hours Studies/Results: Ct Abdomen Pelvis Wo Contrast  04/07/2011  *RADIOLOGY REPORT*  Clinical Data: Lower abdominal pain, history  of ischemic colitis, hepatitis C, prior nephrectomy, appendectomy, and colostomy  CT ABDOMEN AND PELVIS WITHOUT CONTRAST  Technique:  Multidetector CT imaging of the abdomen and pelvis was performed following the standard protocol without intravenous contrast.  Comparison: 03/15/2011  Findings: Mild linear scarring at the left lung base.  Unenhanced liver, spleen, pancreas, and adrenal glands are within normal limits.  Again seen is mild gallbladder wall thickening. No definite associated inflammatory changes by CT.  Status post right nephrectomy.  Numerous cysts of varying sizes/complexity in the left kidney.  No hydronephrosis.  No evidence of bowel obstruction.  Right lower quadrant ileostomy. Hartmann's pouch.  Atherosclerotic calcifications of the abdominal aorta and branch vessels.  Interval removal of prior right abdominal pigtail drain.  Minimal residual stranding posterior to the liver (series 2/image 24), without drainable fluid collection or abscess.  No suspicious abdominopelvic lymphadenopathy.  Uterus is unremarkable.  No adnexal masses.  Bladder is decompressed.  Mild postsurgical changes in the midline anterior abdominal wall (series 2/image 41).  Degenerative changes of the  visualized thoracolumbar spine.  Prior posterior fixation with interbody fusion from the L4-S1, with stable alignment.  IMPRESSION: Interval removal of prior right abdominal pigtail drain.  Minimal residual soft tissue stranding without drainable fluid collection or abscess.  No evidence of bowel obstruction.  Right lower quadrant ileostomy. Hartmann's pouch.  Status post right nephrectomy.  Original Report Authenticated By: Julian Hy, M.D.   Medications:    . sodium chloride 20 mL (04/08/11 1420)      . cefUROXime (ZINACEF)  IV  1.5 g Intravenous On Call to OR  . ciprofloxacin  500 mg Oral Q breakfast  . darbepoetin  100 mcg Intravenous Q Sat-HD  . gabapentin  300 mg Oral Daily  . heparin  5,000 Units  Subcutaneous Q8H  . HYDROmorphone      . multivitamin  1 tablet Oral QHS  . pantoprazole  40 mg Oral Q1200  . sodium chloride  3 mL Intravenous Q12H    I  have reviewed scheduled and prn medications.  Physical Exam:  General appearance: Alert, oriented x 3 Resp: CTA bilaterally  Cardio: Tachycardic with Gr II/IV systolic murmur  GI: + BS, soft and nontender, RLQ ileostomy  Extremities: No edema  Access: AVG @ RUA; +T/B   Problem/Plan:  1. AMS - resolved, now @ baseline 2. ESRD - HD TTS [place], seen on HD   3. Vascular access - s/p AVG revision yesterday Dr. Donnetta Hutching; no difficulties with cannulation today 4. Anemia - Hgb 11.8; on Aranesp 100 mcg qwk; follow trend  5. HTN/Volume - BP fairly well controlled, no fluid issues   6. Secondary hyperparathyroidism - Ca 9.4, P 2.9, Calcium-based binders held, no Zemplar.  7. Ischemic colitis - s/p right hemicolectomy and ileostomy; abd pain OTW; remains on Cipro (recent abscess); GI following 8. Tachycardia - remains with HR 117-120; asymptomatic; Cardiology following and recommends low dose BB; Metoprolol 12.5mg  BID; needs Rx at time of discharge   9. Peripheral neuropathy- did well last night tolerating Lyrica 25mg  qhs prn 10. Disposition- per primary services; suspect today   Marni Griffon, FNP-C [facility] Pager [phone]  04/09/2011,8:57 AM  LOS: 6 days

## 2011-04-09 NOTE — Progress Notes (Signed)
Patient ID: [name], female   DOB: 1951-01-06, 61 y.o.   MRN: [id number] The patient is status post right upper arm revision of her AV graft by myself yesterday. She has excellent thrill in her graft today with minimal soreness. She does have significant degeneration but has had good function for a great number of years in this right upper arm graft. She should daily used to scrap without difficulty. A will not follow actively. Please reconsult if we can assist further.  Curt Jews, MD

## 2011-04-09 NOTE — Progress Notes (Signed)
Internal Medicine Teaching Service Attending Note Date: 04/09/2011  Patient name: [name]  Medical record number: [id number]  Date of birth: January 15, 1951    This patient has been seen and discussed with the house staff. Please see their note for complete details. I concur with their findings with the following additions/corrections: Patient in dialysis when we went to see her. Her change in MS has resolved. She will go home on Cipro, to f/up with GI at the end of the month, gabapentin 300mg  a day, and off of narcotics. Her abdominal pain has resolved. We will put her on a low dose of a beta blocker for her sinus tachycardia.  [place] E 04/09/2011, 12:15 PM

## 2011-04-09 NOTE — Clinical Documentation Improvement (Signed)
BMI DOCUMENTATION CLARIFICATION QUERY  THIS DOCUMENT IS NOT A PERMANENT PART OF THE MEDICAL RECORD  TO RESPOND TO THE THIS QUERY, FOLLOW THE INSTRUCTIONS BELOW:  1. If needed, update documentation for the patient's encounter via the notes activity.  2. Access this query again and click edit on the [employer].  3. After updating, or not, click F2 to complete all highlighted (required) fields concerning your review. Select "additional documentation in the medical record" OR "no additional documentation provided".  4. Click Sign note button.  5. The deficiency will fall out of your In Basket *Please let us know if you are not able to complete this workflow by phone or e-mail (listed below).         04/09/11  Dear Dr. Nance Pew Rolley Sims  In an effort to better capture your patient's severity of illness, reflect appropriate length of stay and utilization of resources, a review of the patient medical record has revealed the following indicators.    Please consider the below (if your clinical findings/judgment agree) as you document the patient's condition(s) in the progress note and discharge summary. Thank you!   Possible Clinical conditions:  Underweight w/BMI= 17.1  Other condition___________________  Cannot Clinically determine _____________  Risk Factors: Sign & Symptoms: Weight: 102.9 LBS Height 5'5" BMI= 17.1     Reviewed:  no additional documentation provided  Thank You,  [place] Documentation Specialist: [phone] Pager  [place]

## 2011-04-09 NOTE — Progress Notes (Signed)
    SUBJECTIVE:  Denies any chest pain, palpitations or abdominal pain  PHYSICAL EXAM Filed Vitals:   04/08/11 1730 04/08/11 1804 04/08/11 2134 04/09/11 0526  BP:  106/85 94/71 114/68  Pulse: 107 102 116 114  Temp: 98.7 F (37.1 C) 97.5 F (36.4 C) 97.9 F (36.6 C) 98.4 F (36.9 C)  TempSrc:  Oral Oral Oral  Resp: 19 18 20 18   Height:      Weight:      SpO2: 100% 100% 94% 97%   General:  No distress Lungs:  Clear Heart:  RRR, murmur unchanged Abdomen: Positive bowel sounds, no rebound no guarding Extremities:  No edema.  LABS:  No results found for this or any previous visit (from the past 24 hour(s)).  Intake/Output Summary (Last 24 hours) at 04/09/11 0706 Last data filed at 04/08/11 1615  Gross per 24 hour  Intake    150 ml  Output     30 ml  Net    120 ml   Telemetry:  Sinus tachycardia rate 100-120  ASSESSMENT AND PLAN:  Tachycardia:  Sinus tach.  I will arrange out patient event monitoring.  She can start a low dose beta blocker as her BP allows.  Murmur:  Moderate AS by echo.  Normal EF.  I will follow this clinically.  No evidence that this is contributing to symptoms at this point can be directly attributed to this valve abnormality.   Jeneen Rinks [facility] 04/09/2011 7:06 AM

## 2011-04-10 LAB — CARDIAC PANEL(CRET KIN+CKTOT+MB+TROPI)
CK, MB: 1.7 ng/mL (ref 0.3–4.0)
Total CK: 16 U/L (ref 7–177)
Troponin I: <0.3 ng/mL (ref <0.30)

## 2011-04-10 MED ORDER — PREGABALIN 25 MG PO CAPS: 25.0000 mg | ORAL_CAPSULE | Freq: Every evening | ORAL | Status: DC | PRN

## 2011-04-10 MED ORDER — GABAPENTIN 300 MG PO CAPS: 300.0000 mg | ORAL_CAPSULE | Freq: Every day | ORAL | Status: DC

## 2011-04-10 NOTE — Progress Notes (Signed)
SUBJECTIVE:  Denies any chest pain, palpitations or abdominal pain.  She does have foot pain.  I also note the episode of chest pain and hypotension on dialysis yesterday.    PHYSICAL EXAM Filed Vitals:   04/09/11 1400 04/09/11 1816 04/09/11 2143 04/10/11 0559  BP: 92/49 112/74 83/55 118/78  Pulse: 99 104 124 121  Temp: 99.2 F (37.3 C) 98.7 F (37.1 C) 98.2 F (36.8 C) 98.1 F (36.7 C)  TempSrc: Oral Oral Oral Oral  Resp: 24 22 20 20   Height:      Weight:    46 kg (101 lb 6.6 oz)  SpO2: 94% 96% 97% 100%   General:  No distress Lungs:  Clear Heart:  RRR, murmur unchanged Abdomen: Positive bowel sounds, no rebound no guarding Extremities:  No edema.  LABS:  Results for orders placed during the hospital encounter of 04/03/11 (from the past 24 hour(s))  CBC     Status: Abnormal   Collection Time   04/09/11  8:56 AM      Component Value Range   WBC 8.5  4.0 - 10.5 (K/uL)   RBC 3.85 (*) 3.87 - 5.11 (MIL/uL)   Hemoglobin 10.9 (*) 12.0 - 15.0 (g/dL)   HCT 33.1 (*) 36.0 - 46.0 (%)   MCV 86.0  78.0 - 100.0 (fL)   MCH 28.3  26.0 - 34.0 (pg)   MCHC 32.9  30.0 - 36.0 (g/dL)   RDW 17.6 (*) 11.5 - 15.5 (%)   Platelets 319  150 - 400 (K/uL)  CARDIAC PANEL(CRET KIN+CKTOT+MB+TROPI)     Status: Normal   Collection Time   04/09/11 12:34 PM      Component Value Range   Total CK 19  7 - 177 (U/L)   CK, MB 2.1  0.3 - 4.0 (ng/mL)   Troponin I <0.30  <0.30 (ng/mL)   Relative Index RELATIVE INDEX IS INVALID  0.0 - 2.5   CARDIAC PANEL(CRET KIN+CKTOT+MB+TROPI)     Status: Normal   Collection Time   04/09/11  7:17 PM      Component Value Range   Total CK 21  7 - 177 (U/L)   CK, MB 1.8  0.3 - 4.0 (ng/mL)   Troponin I <0.30  <0.30 (ng/mL)   Relative Index RELATIVE INDEX IS INVALID  0.0 - 2.5   BASIC METABOLIC PANEL     Status: Abnormal   Collection Time   04/09/11  7:25 PM      Component Value Range   Sodium 137  135 - 145 (mEq/L)   Potassium 4.5  3.5 - 5.1 (mEq/L)   Chloride 96  96 -  112 (mEq/L)   CO2 28  19 - 32 (mEq/L)   Glucose, Bld 124 (*) 70 - 99 (mg/dL)   BUN 22  6 - 23 (mg/dL)   Creatinine, Ser 4.38 (*) 0.50 - 1.10 (mg/dL)   Calcium 9.5  8.4 - 10.5 (mg/dL)   GFR calc non Af Amer 10 (*) >90 (mL/min)   GFR calc Af Amer 12 (*) >90 (mL/min)  CARDIAC PANEL(CRET KIN+CKTOT+MB+TROPI)     Status: Normal   Collection Time   04/10/11  2:49 AM      Component Value Range   Total CK 16  7 - 177 (U/L)   CK, MB 1.7  0.3 - 4.0 (ng/mL)   Troponin I <0.30  <0.30 (ng/mL)   Relative Index RELATIVE INDEX IS INVALID  0.0 - 2.5     Intake/Output Summary (Last  24 hours) at 04/10/11 0811 Last data filed at 04/09/11 1817  Gross per 24 hour  Intake    360 ml  Output   1050 ml  Net   -690 ml   Telemetry:  Sinus tachycardia rate 100-120.   No SVT.  04/10/2011   ASSESSMENT AND PLAN:  Tachycardia:  Sinus tach.  I will arrange out patient event monitoring.  Low dose beta blocker started yesterday.  Murmur:  Moderate AS by echo.  Normal EF.  I will follow this in the office.   Chest pain:  No evidence of ischemia.  Enzymes negative.  No further work up. She thinks that this was related to a dry weight that was too low.    I will arrange follow up in our office.   Jeneen Rinks [facility] 04/10/2011 8:11 AM

## 2011-04-10 NOTE — Progress Notes (Signed)
Subjective:  Feeling great;  Coat on and ready to go home  Vital signs in last 24 hours: Filed Vitals:   04/09/11 1400 04/09/11 1816 04/09/11 2143 04/10/11 0559  BP: 92/49 112/74 83/55 118/78  Pulse: 99 104 124 121  Temp: 99.2 F (37.3 C) 98.7 F (37.1 C) 98.2 F (36.8 C) 98.1 F (36.7 C)  TempSrc: Oral Oral Oral Oral  Resp: 24 22 20 20   Height:      Weight:    46 kg (101 lb 6.6 oz)  SpO2: 94% 96% 97% 100%   Weight change:   Intake/Output Summary (Last 24 hours) at 04/10/11 1026 Last data filed at 04/09/11 1817  Gross per 24 hour  Intake    360 ml  Output   1050 ml  Net   -690 ml   Labs: Basic Metabolic Panel:  Lab [id number] 1925 04/09/11 0545 04/08/11 0500 04/07/11 0500 04/06/11 0600  NA 137 137 137 -- --  K 4.5 3.8 3.9 -- --  CL 96 95* 95* -- --  CO2 28 24 25  -- --  GLUCOSE 124* 122* 85 -- --  BUN 22 58* 46* -- --  CREATININE 4.38* 8.14* 6.71* -- --  CALCIUM 9.5 9.1 9.4 -- --  ALB -- -- -- -- --  PHOS -- 2.9 -- 2.9 4.5   Liver Function Tests:  Lab 04/09/11 0545 04/07/11 0500 04/06/11 0600 04/03/11 1716  AST -- -- -- 31  ALT -- -- -- 15  ALKPHOS -- -- -- 119*  BILITOT -- -- -- 0.3  PROT -- -- -- 8.4*  ALBUMIN 3.2* 3.5 3.4* --   No results found for this basename: LIPASE:3,AMYLASE:3 in the last 168 hours  Lab 04/03/11 1717  AMMONIA 17   CBC:  Lab 04/09/11 0856 04/08/11 0500 04/07/11 0500 04/06/11 0600 04/05/11 1720 04/04/11 0640 04/03/11 1716  WBC 8.5 9.3 7.7 -- -- -- --  NEUTROABS -- -- -- -- -- 3.5 4.1  HGB 10.9* 11.8* 12.4 -- -- -- --  HCT 33.1* 37.1 39.8 -- -- -- --  MCV 86.0 87.7 89.0 88.1 87.9 -- --  PLT 319 346 306 -- -- -- --   Cardiac Enzymes:  Lab 04/10/11 0249 04/09/11 1917 04/09/11 1234 04/03/11 1721  CKTOTAL 16 21 19  --  CKMB 1.7 1.8 2.1 --  CKMBINDEX -- -- -- --  TROPONINI <0.30 <0.30 <0.30 <0.30   CBG: No results found for this basename: GLUCAP:5 in the last 168 hours  Iron Studies: No results found for this basename:  IRON,TIBC,TRANSFERRIN,FERRITIN in the last 72 hours Studies/Results: US Aorta Port  04/09/2011  *RADIOLOGY REPORT*  Clinical Data: Abdominal through the, diabetes, hypertension, dialysis patient  ABDOMINAL AORTA SCREENING ULTRASOUND  Technique:  Ultrasound examination of the abdominal aorta was performed as a screening evaluation for abdominal aortic aneurysm.  Comparison:  04/07/2011 CT without contrast  Abdominal Aorta: Atherosclerotic changes present of the aorta.  No significant aneurysm.        Maximum AP diameter:  2.3 cm.       Maximum TRV diameter:  2.4 cm.  IMPRESSION: Aortic atherosclerosis without aneurysm.  Original Report Authenticated By: Jerilynn Mages. Daryll Brod, M.D.   Medications:    . sodium chloride 20 mL (04/08/11 1420)      . darbepoetin  100 mcg Intravenous Q Sat-HD  . gabapentin  300 mg Oral Daily  . heparin  5,000 Units Subcutaneous Q8H  . metoprolol succinate  12.5 mg Oral Daily  . multivitamin  1 tablet Oral QHS  . pantoprazole  40 mg Oral Q1200  . sodium chloride  3 mL Intravenous Q12H  . DISCONTD: ciprofloxacin  500 mg Oral Q breakfast  . DISCONTD: gabapentin  300 mg Oral Daily    I  have reviewed scheduled and prn medications.  Physical Exam:  General appearance: Alert, oriented x 3  Resp: CTA bilaterally  Cardio: Tachycardic with Gr II/IV systolic murmur  GI: + BS, soft and nontender, RLQ ileostomy  Extremities: No edema  Access: AVG @ RUA; +T/B   Problem/Plan:  1. AMS - resolved, now @ baseline  2. ESRD - HD TTS [place], next tx tomorrow outpt center  3. Vascular access - s/p AVG revision yesterday Dr. Donnetta Hutching; no difficulties with cannulation on HD yest; AF x2 outpt   4. Anemia - Hgb 10.9; Aranesp 100 mcg qwk; convert Epogen at time of discharge; follow outpt setting  5. HTN/Volume - BP soft; will need follow closely outpt, no fluid issues  6. Secondary hyperparathyroidism - Ca 9.4, P 2.9, Calcium-based binders held, no Zemplar.  7. Ischemic colitis - s/p  right hemicolectomy and ileostomy; abd pain OTW; remains on Cipro (recent abscess); GI following  8. Tachycardia - improved with low dose BB, episode brief chest pain and hypotension on HD yesterday; resolved with IVF and will need eDW change; cleared cardiology for discharge (outpt event monitoring, and f/u office) 9. Peripheral neuropathy- did well last night tolerating Lyrica 25mg  qhs prn  10. Disposition- discharged today  Marni Griffon, FNP-C [facility] Pager [phone]  04/10/2011,10:26 AM  LOS: 7 days       I did not see this patient prior to discharge but reviewed and agree with plan as outlined by Linna Hoff FNP-C. Hank Walling A,MD 04/10/2011 5:07 PM

## 2011-04-14 LAB — CULTURE, BLOOD (ROUTINE X 2)
Culture  Setup Time: 201301210108
Culture: NO GROWTH

## 2011-04-22 ENCOUNTER — Ambulatory Visit (INDEPENDENT_AMBULATORY_CARE_PROVIDER_SITE_OTHER): Payer: Medicare Other | Admitting: Surgery

## 2011-04-22 ENCOUNTER — Encounter (INDEPENDENT_AMBULATORY_CARE_PROVIDER_SITE_OTHER): Payer: Self-pay | Admitting: Surgery

## 2011-04-22 VITALS — BP 98/68 | HR 68 | Temp 97.2°F | Resp 18 | Ht 65.0 in | Wt 92.4 lb

## 2011-04-22 DIAGNOSIS — Z09 Encounter for follow-up examination after completed treatment for conditions other than malignant neoplasm: Secondary | ICD-10-CM

## 2011-04-22 DIAGNOSIS — Z932 Ileostomy status: Secondary | ICD-10-CM | POA: Insufficient documentation

## 2011-04-22 NOTE — Progress Notes (Signed)
Patient ID: [name], female   DOB: 11/03/1950, 61 y.o.   MRN: [id number]  Chief Complaint  Patient presents with  . Follow-up    f/u from hospital    HPI [name] is a 61 y.o. female.   HPIShe is here for a followup status post emergent surgery for ischemic right colon back on November 24. She had a long course and rehabilitation and he is a dialysis patient. She currently has an ileostomy. She is doing very well. She is here to discuss ileostomy reversal  Past Medical History  Diagnosis Date  . Seizures     r/t HTN in 1990's x 1  . Brain aneurysm     No records could be found  . Blood transfusion 1990's    r/t Kidney removal surgery  . Arthritis     Back  . Anemia     anemia of chronic disease likely 2/2 ESRD per last anemia panel (02/2011) with Fe 35, TIBC 167, ferritin 2041  // BL Hgb 8-10  . Umbilical hernia age 58  . Heart murmur     Born with heart mumur, does not require follow up per pt  . Hypertension     Does not see a heart doctor, had pre transplant stress test at [facility]    . Diabetes mellitus     Borderline  . End stage renal disease on dialysis     Secondary to hypertension // T/Th/Sat dialysis on [employer]  . Hepatitis C   . Colitis, ischemic 01/2011    S/P partial colectomy of right hemicolon and ileostomy placement  . Lumbar spinal stenosis      bilateral L4-5 and L5-S1 posterior lumbar interbody arthrodesis and bilateral L4-S1 posterior lateral arthrodesis for lumbar stenosis dynamic lumbar spondylolisthesis lumbar spondylosis lumbar radiculopathy with foot drop // s/p surgery 01/2011 - see surgery section for details.    Past Surgical History  Procedure Date  . Nephrectomy 2010    right side done at [facility] on transplant list  . Umbilical hernia repair age 46- 82  . Appendectomy 1960's  . Vascular surgery     right arm dialysis graft  . Tonsillectomy     as a child  . Rib resection     d/t kidney removal  . Back surgery  01/30/2011    L4-S1 lumbar laminectomy facetectomy and foraminotomies for decompression a bilateral L4-5 and L5-S1 posterior lumbar interbody arthrodesis and bilateral L4-S1 posterior lateral arthrodesis for lumbar stenosis dynamic lumbar spondylolisthesis lumbar spondylosis lumbar radiculopathy with foot drop  . Laparotomy 02/09/2011    Procedure: EXPLORATORY LAPAROTOMY;  Surgeon: Harl Bowie, MD;  Location: [place];  Service: General;  Laterality: N/A;  . Partial colectomy 02/09/2011    Reason for surgery: ischemic colitis of right hemicolon; Surgeon: Harl Bowie, MD;  Location: [place];  Service: General;  Laterality: Right;  . Colostomy 02/09/2011    Reason for surgery: ischemic colitis of right hemicolon requiring partial colectomy and ileostomy.  Surgeon: Harl Bowie, MD;  Location: [place];  Service: General;  Laterality: Right;  . Colonoscopy 02/19/2011    Procedure: COLONOSCOPY;  Surgeon: Missy Sabins, MD;  Location: [place];  Service: Endoscopy;  Laterality: N/A;  . Colonoscopy 03/07/2011    Procedure: COLONOSCOPY;  Surgeon: Cleotis Nipper, MD;  Location: [facility] ENDOSCOPY;  Service: Endoscopy;  Laterality: N/A;  no prep needed--pt has long Hartmann's pouch and ileostomy  . Total parathyroidectomy 01/17/2001    with  autotransplantation into left forearm // due to secondary hyperparathyroidism from ESRD  . Av fistula placement 04/08/2011    Procedure: INSERTION OF ARTERIOVENOUS (AV) GORE-TEX GRAFT ARM;  Surgeon: Rosetta Posner, MD;  Location: [facility] OR;  Service: Vascular;  Laterality: Right;  Revision of Upper Arm Gore-Tex Graft    Family History  Problem Relation Age of Onset  . Anesthesia problems Neg Hx   . Hypotension Neg Hx   . Malignant hyperthermia Neg Hx   . Pseudochol deficiency Neg Hx   . Diabetes Father   . Stroke Mother     Social History History  Substance Use Topics  . Smoking status: Former Smoker -- 0.5 packs/day for 48 years    Types: Cigarettes     Quit date: 08/17/2010  . Smokeless tobacco: Never Used  . Alcohol Use: 1.2 oz/week    2 Shots of liquor per week     1 pint on Fridays    Allergies  Allergen Reactions  . Contrast Media (Iodinated Diagnostic Agents)     Seziure  . Iohexol Other (See Comments)    Reaction is convulsions    Current Outpatient Prescriptions  Medication Sig Dispense Refill  . darbepoetin (ARANESP) 200 MCG/0.4ML SOLN Inject 200 mcg into the skin. Administered at dialysis per nephrology      . gabapentin (NEURONTIN) 300 MG capsule Take 1 capsule (300 mg total) by mouth daily. Started Friday PTA  30 capsule  0  . metoprolol succinate (TOPROL-XL) 12.5 mg TB24 Take 0.5 tablets (12.5 mg total) by mouth daily.  30 tablet  0  . multivitamin (RENA-VIT) TABS tablet Take 1 tablet by mouth daily.  30 tablet  1  . pantoprazole (PROTONIX) 40 MG tablet Take 1 tablet (40 mg total) by mouth daily at 12 noon.  30 tablet  1  . pregabalin (LYRICA) 25 MG capsule Take 1 capsule (25 mg total) by mouth at bedtime as needed (burning pain in feet).  30 capsule  0    Review of Systems Review of Systems  All other systems reviewed and are negative.    Blood pressure 98/68, pulse 68, temperature 97.2 F (36.2 C), temperature source Temporal, resp. rate 18, height 5\' 5"  (1.651 m), weight 92 lb 6.4 oz (41.912 kg).  Physical Exam Physical Exam  Constitutional: She is oriented to person, place, and time. She appears well-developed and well-nourished. No distress.  HENT:  Head: Normocephalic and atraumatic.  Right Ear: External ear normal.  Left Ear: External ear normal.  Nose: Nose normal.  Eyes: Conjunctivae are normal. Pupils are equal, round, and reactive to light.  Neck: Neck supple. No tracheal deviation present. No thyromegaly present.  Cardiovascular: Normal rate, regular rhythm, normal heart sounds and intact distal pulses.   Pulmonary/Chest: Effort normal and breath sounds normal. No respiratory distress. She has no  wheezes.  Abdominal: Soft. Bowel sounds are normal.       Ileostomy is pink and well perfused. Midline incision is clean  Musculoskeletal: Normal range of motion.  Lymphadenopathy:    She has no cervical adenopathy.  Neurological: She is alert and oriented to person, place, and time.  Skin: Skin is warm and dry. No rash noted. No erythema.  Psychiatric: Her behavior is normal. Judgment normal.    Data Reviewed   Assessment    Patient postop status post ileostomy. Multiple comorbidities including chronic renal failure on hemodialysis    Plan    She is eager to proceed with ileostomy reversal which I  think is very reasonable. I discussed this with the patient and her daughters. I discussed the risk of surgery which includes but is not limited to bleeding, infection, anastomotic leak, wound infection, wound breakdown, anastomotic breakdown with need for repeat ileostomy, cardiopulmonary issues, et Ronney Asters. She wishes to proceed. Likelihood of success is good       Donyae Kohn A 04/22/2011, 2:39 PM

## 2011-04-22 NOTE — Progress Notes (Signed)
I have seen and examined this patient and agree with plan .Marland Kitchen Damarious Holtsclaw T,MD 04/22/2011 10:29 AM

## 2011-04-23 ENCOUNTER — Encounter: Payer: Self-pay | Admitting: Cardiology

## 2011-04-23 ENCOUNTER — Ambulatory Visit (INDEPENDENT_AMBULATORY_CARE_PROVIDER_SITE_OTHER): Payer: Medicare Other | Admitting: Cardiology

## 2011-04-23 DIAGNOSIS — I350 Nonrheumatic aortic (valve) stenosis: Secondary | ICD-10-CM

## 2011-04-23 DIAGNOSIS — Z01810 Encounter for preprocedural cardiovascular examination: Secondary | ICD-10-CM

## 2011-04-23 DIAGNOSIS — R000 Tachycardia, unspecified: Secondary | ICD-10-CM

## 2011-04-23 DIAGNOSIS — Z932 Ileostomy status: Secondary | ICD-10-CM

## 2011-04-23 DIAGNOSIS — I359 Nonrheumatic aortic valve disorder, unspecified: Secondary | ICD-10-CM

## 2011-04-23 DIAGNOSIS — I10 Essential (primary) hypertension: Secondary | ICD-10-CM

## 2011-04-23 MED ORDER — METOPROLOL SUCCINATE 12.5 MG HALF TABLET: 12.5000 mg | ORAL_TABLET | Freq: Every day | ORAL | Status: DC

## 2011-04-23 NOTE — Assessment & Plan Note (Signed)
Her blood pressure was actually running low. However, she seems to be tolerating the medications as listed. I will continue these.

## 2011-04-23 NOTE — Assessment & Plan Note (Signed)
This is moderate by echo I will follow this with an echocardiogram in one year.

## 2011-04-23 NOTE — Assessment & Plan Note (Signed)
The patient does have sinus tachycardia. She had some nonsustained SVT but no symptoms related to this. Evaluation has been unrevealing. In particular her thyroid has been normal. At this point no further change in therapy or evaluation is indicated. She will remain on the meds as listed.

## 2011-04-23 NOTE — Progress Notes (Signed)
HPI The patient presents for followup after recent hospitalization for altered mental status. We will consult because of sinus tachycardia. She did have brief runs of supraventricular tachycardia. She is noted to have moderate aortic stenosis on echo a well preserved ejection fraction. The etiology of her sinus tachycardia was never clear. She has been treated with a low dose of beta blocker.  Since discharge the patient has done well. She is tolerating dialysis without hypotension. She's not noticing any tachycardia palpitations though her resting heart rate remains high. She has not had any confusion that prompted her previous hospitalization. She has not had any of the chest discomfort that she had once during her hospitalization her blood pressure fell during dialysis. She has not had any chest pressure, neck or arm discomfort. She denies any new shortness of breath, PND or orthopnea. Of note she gets around in a wheelchair with bilateral footdrop following back surgery in the past. In addition she is being considered for takedown of a colostomy.  Allergies  Allergen Reactions  . Contrast Media (Iodinated Diagnostic Agents)     Seziure  . Iohexol Other (See Comments)    Reaction is convulsions    Current Outpatient Prescriptions  Medication Sig Dispense Refill  . darbepoetin (ARANESP) 200 MCG/0.4ML SOLN Inject 200 mcg into the skin. Administered at dialysis per nephrology      . gabapentin (NEURONTIN) 300 MG capsule Take 1 capsule (300 mg total) by mouth daily. Started Friday PTA  30 capsule  0  . metoprolol succinate (TOPROL-XL) 12.5 mg TB24 Take 0.5 tablets (12.5 mg total) by mouth daily.  30 tablet  0  . multivitamin (RENA-VIT) TABS tablet Take 1 tablet by mouth daily.  30 tablet  1  . pantoprazole (PROTONIX) 40 MG tablet Take 1 tablet (40 mg total) by mouth daily at 12 noon.  30 tablet  1  . pregabalin (LYRICA) 25 MG capsule Take 1 capsule (25 mg total) by mouth at bedtime as needed  (burning pain in feet).  30 capsule  0    Past Medical History  Diagnosis Date  . Seizures     r/t HTN in 1990's x 1  . Brain aneurysm     No records could be found  . Blood transfusion 1990's    r/t Kidney removal surgery  . Arthritis     Back  . Anemia     anemia of chronic disease likely 2/2 ESRD per last anemia panel (02/2011) with Fe 35, TIBC 167, ferritin 2041  // BL Hgb 8-10  . Umbilical hernia age 36  . Heart murmur     Born with heart mumur, does not require follow up per pt  . Hypertension     Does not see a heart doctor, had pre transplant stress test at [facility]    . Diabetes mellitus     Borderline  . End stage renal disease on dialysis     Secondary to hypertension // T/Th/Sat dialysis on [employer]  . Hepatitis C   . Colitis, ischemic 01/2011    S/P partial colectomy of right hemicolon and ileostomy placement  . Lumbar spinal stenosis      bilateral L4-5 and L5-S1 posterior lumbar interbody arthrodesis and bilateral L4-S1 posterior lateral arthrodesis for lumbar stenosis dynamic lumbar spondylolisthesis lumbar spondylosis lumbar radiculopathy with foot drop // s/p surgery 01/2011 - see surgery section for details.    Past Surgical History  Procedure Date  . Nephrectomy 2010    right  side done at [facility] on transplant list  . Umbilical hernia repair age 60- 89  . Appendectomy 1960's  . Vascular surgery     right arm dialysis graft  . Tonsillectomy     as a child  . Rib resection     d/t kidney removal  . Back surgery 01/30/2011    L4-S1 lumbar laminectomy facetectomy and foraminotomies for decompression a bilateral L4-5 and L5-S1 posterior lumbar interbody arthrodesis and bilateral L4-S1 posterior lateral arthrodesis for lumbar stenosis dynamic lumbar spondylolisthesis lumbar spondylosis lumbar radiculopathy with foot drop  . Laparotomy 02/09/2011    Procedure: EXPLORATORY LAPAROTOMY;  Surgeon: Harl Bowie, MD;  Location: [place];  Service: General;   Laterality: N/A;  . Partial colectomy 02/09/2011    Reason for surgery: ischemic colitis of right hemicolon; Surgeon: Harl Bowie, MD;  Location: [place];  Service: General;  Laterality: Right;  . Colostomy 02/09/2011    Reason for surgery: ischemic colitis of right hemicolon requiring partial colectomy and ileostomy.  Surgeon: Harl Bowie, MD;  Location: [place];  Service: General;  Laterality: Right;  . Colonoscopy 02/19/2011    Procedure: COLONOSCOPY;  Surgeon: Missy Sabins, MD;  Location: [place];  Service: Endoscopy;  Laterality: N/A;  . Colonoscopy 03/07/2011    Procedure: COLONOSCOPY;  Surgeon: Cleotis Nipper, MD;  Location: [facility] [place] ENDOSCOPY;  Service: Endoscopy;  Laterality: N/A;  no prep needed--pt has long Hartmann's pouch and ileostomy  . Total parathyroidectomy 01/17/2001    with autotransplantation into left forearm // due to secondary hyperparathyroidism from ESRD  . Av fistula placement 04/08/2011    Procedure: INSERTION OF ARTERIOVENOUS (AV) GORE-TEX GRAFT ARM;  Surgeon: Rosetta Posner, MD;  Location: [facility] OR;  Service: Vascular;  Laterality: Right;  Revision of Upper Arm Gore-Tex Graft    ROS:  As stated in the HPI and negative for all other systems.  PHYSICAL EXAM BP 80/60  Pulse 100  Ht 5\' 5"  (1.651 m)  Wt 92 lb (41.731 kg)  BMI 15.31 kg/m2 GENERAL: Frail and thin but in no distress, examined in the wheelchair NECK: No jugular venous distention at 90 degrees, waveform within normal limits, carotid upstroke brisk and symmetric, no bruits, no thyromegaly  LYMPHATICS: No cervical, inguinal adenopathy  LUNGS: Clear to auscultation bilaterally  BACK: No CVA tenderness  CHEST: Unremarkable  HEART: PMI not displaced or sustained,S1 and S2 within normal limits, no S3, no S4, no clicks, no rubs, apical systolic murmur radiating slightly out the outflow tract ABD: Flat, positive bowel sounds normal in frequency in pitch, colostomy bag EXT: 2 plus pulses throughout, no  edema, no cyanosis no clubbing, right upper arm thrill and bruit over the dialysis fistula.  NEURO: Cranial nerves II through XII grossly intact, motor grossly intact throughout except for bilateral foot drop.  PSYCH: Cognitively intact, oriented to person place and time   ASSESSMENT AND PLAN

## 2011-04-23 NOTE — Assessment & Plan Note (Signed)
She is being considered for takedown of this. I would like to review the results of the stress perfusion study apparently done at [facility]. Further preoperative clearance based on this result.

## 2011-04-23 NOTE — Patient Instructions (Signed)
The current medical regimen is effective;  continue present plan and medications.  Your physician has requested that you have an echocardiogram in 1 year. Echocardiography is a painless test that uses sound waves to create images of your heart. It provides your doctor with information about the size and shape of your heart and how well your heart's chambers and valves are working. This procedure takes approximately one hour. There are no restrictions for this procedure.  Follow up in 1 year with Dr Percival Spanish.  You will receive a letter in the mail 2 months before you are due.  Please call us when you receive this letter to schedule your follow up appointment.

## 2011-04-24 ENCOUNTER — Telehealth: Payer: Self-pay | Admitting: Cardiology

## 2011-04-24 NOTE — Telephone Encounter (Signed)
ROI faxed to [facility] @ [phone] 04/24/11/KM

## 2011-04-25 ENCOUNTER — Telehealth: Payer: Self-pay | Admitting: Cardiology

## 2011-04-25 NOTE — Telephone Encounter (Signed)
New Msg: Pharmacy calling wanting to verify directions of metoprolol succinate. Directions say take 1/2 tablet po daily. Pt said she thought she was suppose to take 1 tablet po daily.   Pt is at pharmacy now.   Please return pharmacy call to clarify.

## 2011-04-26 ENCOUNTER — Ambulatory Visit (INDEPENDENT_AMBULATORY_CARE_PROVIDER_SITE_OTHER): Payer: Medicare Other | Admitting: Internal Medicine

## 2011-04-26 ENCOUNTER — Encounter: Payer: Self-pay | Admitting: Internal Medicine

## 2011-04-26 DIAGNOSIS — I10 Essential (primary) hypertension: Secondary | ICD-10-CM

## 2011-04-26 DIAGNOSIS — N186 End stage renal disease: Secondary | ICD-10-CM

## 2011-04-26 DIAGNOSIS — M48061 Spinal stenosis, lumbar region without neurogenic claudication: Secondary | ICD-10-CM

## 2011-04-26 DIAGNOSIS — I120 Hypertensive chronic kidney disease with stage 5 chronic kidney disease or end stage renal disease: Secondary | ICD-10-CM

## 2011-04-26 DIAGNOSIS — Z992 Dependence on renal dialysis: Secondary | ICD-10-CM

## 2011-04-26 DIAGNOSIS — I498 Other specified cardiac arrhythmias: Secondary | ICD-10-CM

## 2011-04-26 DIAGNOSIS — R4182 Altered mental status, unspecified: Secondary | ICD-10-CM

## 2011-04-26 NOTE — Progress Notes (Signed)
Subjective:   Patient ID: MOMINA NIENABER female   DOB: Sep 10, 1950 61 y.o.   MRN: [id number]  HPI: Ms.Skai L Monjaras is a 61 y.o. with PMH as outlined below, who presents for followup after recent hospitalization for altered mental status on 04/03/11.   Her CT-head did not show acute intracranial finding. She was found to have sinus tachycardia and non-sustained episodes of SVT.  2D echo showed moderate aortic stenosis with preserved EF (65 - 70%).  Her Hb and TSH were wnl. She has been seen by Dr. Percival Spanish on 04/23/11. Per Dr. Rosezella Florida note, patient had some nonsustained SVT but no symptoms related to this. Evaluation has been unrevealing. In particular her thyroid has been normal. At this point no further change in therapy or evaluation is indicated. She will remain on the meds as listed.  Patient had back surgery on 01/30/11. After that surgery, she developed bilateral foot drop. She also has pain and tingling in her feet. She is doing PT 3 times a week currently. She reports that PT is helpful.  Patient had right side partial colectomy due to ischemic colitis on 02/09/12. She is still have ileostomy. She is scheduled for surgery of reversal of ileostomy on 05/15/11. She does no have abdominal pain. No pain around the ileostomy site.   Patient has ESRD on HD on T/Thu/S. She did not miss her HD.   Denies fever, chills, fatigue, headaches,  cough, chest pain, SOB,  abdominal pain, dysuria, urgency, frequency, hematuria, or leg swelling.   Past Medical History  Diagnosis Date  . Seizures     r/t HTN in 1990's x 1  . Brain aneurysm     No records could be found  . Blood transfusion 1990's    r/t Kidney removal surgery  . Arthritis     Back  . Anemia     anemia of chronic disease likely 2/2 ESRD per last anemia panel (02/2011) with Fe 35, TIBC 167, ferritin 2041  // BL Hgb 8-10  . Umbilical hernia age 31  . Heart murmur     Born with heart mumur, does not require follow up per pt  .  Hypertension     Does not see a heart doctor, had pre transplant stress test at [facility]    . Diabetes mellitus     Borderline  . End stage renal disease on dialysis     Secondary to hypertension // T/Th/Sat dialysis on [employer]  . Hepatitis C   . Colitis, ischemic 01/2011    S/P partial colectomy of right hemicolon and ileostomy placement  . Lumbar spinal stenosis      bilateral L4-5 and L5-S1 posterior lumbar interbody arthrodesis and bilateral L4-S1 posterior lateral arthrodesis for lumbar stenosis dynamic lumbar spondylolisthesis lumbar spondylosis lumbar radiculopathy with foot drop // s/p surgery 01/2011 - see surgery section for details.   Current Outpatient Prescriptions  Medication Sig Dispense Refill  . darbepoetin (ARANESP) 200 MCG/0.4ML SOLN Inject 200 mcg into the skin. Administered at dialysis per nephrology      . gabapentin (NEURONTIN) 300 MG capsule Take 1 capsule (300 mg total) by mouth daily. Started Friday PTA  30 capsule  0  . metoprolol succinate (TOPROL-XL) 12.5 mg TB24 Take 0.5 tablets (12.5 mg total) by mouth daily.  30 tablet  6  . multivitamin (RENA-VIT) TABS tablet Take 1 tablet by mouth daily.  30 tablet  1  . pantoprazole (PROTONIX) 40 MG tablet Take 1 tablet (40 mg total)  by mouth daily at 12 noon.  30 tablet  1  . pregabalin (LYRICA) 25 MG capsule Take 1 capsule (25 mg total) by mouth at bedtime as needed (burning pain in feet).  30 capsule  0   Family History  Problem Relation Age of Onset  . Anesthesia problems Neg Hx   . Hypotension Neg Hx   . Malignant hyperthermia Neg Hx   . Pseudochol deficiency Neg Hx   . Diabetes Father   . Stroke Mother    History   Social History  . Marital Status: Legally Separated    Spouse Name: N/A    Number of Children: 2  . Years of Education: 10th grade   Occupational History  . Retired     previously worked as a Producer, television/film/video History Main Topics  . Smoking status: Former Smoker -- 0.5 packs/day for  48 years    Types: Cigarettes    Quit date: 08/17/2010  . Smokeless tobacco: Never Used  . Alcohol Use: 1.2 oz/week    2 Shots of liquor per week     1 pint on Fridays  . Drug Use: No  . Sexually Active: No   Other Topics Concern  . None   Social History Narrative  . None   ROS: General: no fevers, chills, no changes in body weight, no changes in appetite Skin: no rash HEENT: no blurry vision, hearing changes or sore throat Pulm: no dyspnea, coughing, wheezing CV: no chest pain, palpitations, shortness of breath Abd: no nausea/vomiting, abdominal pain, diarrhea/constipation GU: no dysuria, hematuria, polyuria Ext: has foot pain Neuro: has weakness and tingling in feet.  Objective:  Physical Exam: Filed Vitals:   04/26/11 0940  BP: 104/74  Pulse: 117  Temp: 97.6 F (36.4 C)  TempSrc: Oral  Height: 5\' 5"  (1.651 m)  Weight: 104 lb 6.4 oz (47.356 kg)   General: not in acute distress. Sitting in wheelchair HEENT: PERRL, EOMI, no scleral icterus Cardiac: tachycardia, S1/S2, RRR, No murmurs, gallops or rubs Pulm: Good air movement bilaterally, Clear to auscultation bilaterally, No rales, wheezing, rhonchi or rubs. Abd: Soft,  nondistended, nontender, no rebound pain, no organomegaly, BS present. Ileostomy. There is no signs of infection around the surgical site. Ext: No rashes or edema, 2+DP/PT pulse bilaterally. Neuro: alert and oriented X3, cranial nerves II-XII grossly intact, decreased muscle strength in legs, worse on the left. Foot drop bilaterally.  5/5 muscle strength in upper extremeties,  sensation to light touch intact.     Assessment & Plan:   ##. Sinus tachycardia - hear rate is 117/min today. She is asymptomatic now. She has been seen by cardiologist, who suggests continue with Beta-blocker, BP allowing, and close follow up with them. will continue metoprolol and follow up.   ##. ESRD on HD  (Tues, Thurs, Sat on [employer]):  Graft revision with  vascular surgery was done in the hospital  following graft failure due to clot and aneurysms. no new issue. Will follow up.  ##. History of ischemic colitis - stable. s/p partial colectomy of right hemicolon with ileostomy per Dr. Ninfa Linden. She is scheduled for  Reversal surgery of leostomy on 05/15/11. She does no have abdominal pain. No pain or signs of infection around the ileostomy site. Will follow up  ##. Lumbar stenosis s/p lumbar disc surgery in 11/12:  Patient denies back pain. Reports foot pain with bilateral foot drop post-op. She is doing PT 3 times a week currently. She reports that  PT is helpful. Will continue her current regiment and follow up.  ##. Altered mental status -Resolved.  ##. HM: she had a normal mammogram on 2009. Will postpone until she completes her ileostomy reversal surgery.    Ivor Costa

## 2011-04-26 NOTE — Progress Notes (Signed)
I discussed patient with resident Dr. Blaine Hamper, and I agree with the plans as outlined in his note.

## 2011-04-30 ENCOUNTER — Other Ambulatory Visit: Payer: Self-pay | Admitting: *Deleted

## 2011-05-01 NOTE — Telephone Encounter (Signed)
Stress Echo received from [facility] gave to [place] 05/01/11/KM

## 2011-05-02 ENCOUNTER — Other Ambulatory Visit: Payer: Self-pay | Admitting: *Deleted

## 2011-05-02 MED ORDER — PREGABALIN 25 MG PO CAPS: 25.0000 mg | ORAL_CAPSULE | Freq: Every evening | ORAL | Status: DC | PRN

## 2011-05-02 NOTE — Telephone Encounter (Signed)
Lyrica rx faxed to [facility].

## 2011-05-02 NOTE — Telephone Encounter (Signed)
Daughter states pt is having a lot of pain in her feet since her back surgery; she has been taking 1 tablet before dialysis (on the days she goes) And then at night.  This is why she is out of pain medication.  Last refill 04/10/10 Qty #30.

## 2011-05-08 ENCOUNTER — Inpatient Hospital Stay (HOSPITAL_COMMUNITY): Admission: RE | Admit: 2011-05-08 | Payer: Medicare Other | Source: Ambulatory Visit

## 2011-05-15 DIAGNOSIS — Z0271 Encounter for disability determination: Secondary | ICD-10-CM

## 2011-05-20 NOTE — Telephone Encounter (Signed)
All Records were Received from [facility], will Forward to Pam/Hochrein for Signature 05/20/11/.KM

## 2011-05-21 NOTE — Pre-Procedure Instructions (Signed)
[place]  05/21/2011   Your procedure is scheduled on:  Wednesday May 29, 2011 at 0830 am.  Report to [facility] at [place] AM.  Call this number if you have problems the morning of surgery: [phone]   Remember:   Do not eat food:After Midnight.  May have clear liquids: up to 4 Hours before arrival until 0230 am.  Clear liquids include soda, tea, black coffee, apple or grape juice, broth.  Take these medicines the morning of surgery with A SIP OF WATER:Aranesp, Gabapentin, Metoprolol, Protonix, and Lyrica.   Do not wear jewelry, make-up or nail polish.  Do not wear lotions, powders, or perfumes. You may wear deodorant.  Do not shave 48 hours prior to surgery.  Do not bring valuables to the hospital.  Contacts, dentures or bridgework may not be worn into surgery.  Leave suitcase in the car. After surgery it may be brought to your room.  For patients admitted to the hospital, checkout time is 11:00 AM the day of discharge.   Patients discharged the day of surgery will not be allowed to drive home.  Name and phone number of your driver:   Special Instructions: CHG Shower Use Special Wash: 1/2 bottle night before surgery and 1/2 bottle morning of surgery.   Please read over the following fact sheets that you were given: Pain Booklet, Coughing and Deep Breathing and Surgical Site Infection Prevention

## 2011-05-22 ENCOUNTER — Encounter (HOSPITAL_COMMUNITY): Payer: Self-pay

## 2011-05-22 ENCOUNTER — Encounter (HOSPITAL_COMMUNITY): Payer: Self-pay | Admitting: Pharmacy Technician

## 2011-05-22 ENCOUNTER — Other Ambulatory Visit: Payer: Self-pay

## 2011-05-22 ENCOUNTER — Encounter (HOSPITAL_COMMUNITY)
Admission: RE | Admit: 2011-05-22 | Discharge: 2011-05-22 | Disposition: A | Discharge status: Home / Self Care | Payer: Medicare Other | Source: Ambulatory Visit | Attending: Surgery | Admitting: Surgery

## 2011-05-22 HISTORY — DX: Foot drop, right foot: M21.371

## 2011-05-22 HISTORY — DX: Dependence on renal dialysis: Z99.2

## 2011-05-22 HISTORY — DX: Foot drop, right foot: M21.372

## 2011-05-22 HISTORY — DX: Cardiac arrhythmia, unspecified: I49.9

## 2011-05-22 HISTORY — DX: Anuria and oliguria: R34

## 2011-05-22 LAB — BASIC METABOLIC PANEL
BUN: 26 mg/dL — ABNORMAL HIGH (ref 6–23)
CO2: 30 mEq/L (ref 19–32)
Calcium: 10.5 mg/dL (ref 8.4–10.5)
GFR calc non Af Amer: 6 mL/min — ABNORMAL LOW (ref >90)
Glucose, Bld: 70 mg/dL (ref 70–99)
Sodium: 136 mEq/L (ref 135–145)

## 2011-05-22 LAB — SURGICAL PCR SCREEN: MRSA, PCR: NEGATIVE

## 2011-05-22 LAB — CBC
MCH: 27.6 pg (ref 26.0–34.0)
MCHC: 32.7 g/dL (ref 30.0–36.0)
MCV: 84.4 fL (ref 78.0–100.0)
Platelets: 263 10*3/uL (ref 150–400)
RBC: 4.24 MIL/uL (ref 3.87–5.11)

## 2011-05-22 NOTE — Progress Notes (Signed)
Pt informed nurse that she had a Stress test done in 2012 at [facility]. Dr. Blaine Hamper is PCP, Dr. Percival Spanish is pts Cardiologist, and Dr. Florene Glen is renal physician. Pt denied having any respiratory issues since last visit therefore CXR was not reordered per [facility] (anesthesia).

## 2011-05-24 ENCOUNTER — Encounter (HOSPITAL_COMMUNITY): Payer: Self-pay | Admitting: Vascular Surgery

## 2011-05-24 NOTE — Consult Note (Addendum)
Anesthesia:  Patient is a 61 year old female scheduled for a ileostomy takedown on 05/29/11.  History includes moderate AS, ESRD on HD s/p RUE AVGG 04/08/11, anemia with history of transfusion, Hep C, ischemic colitis s/p exp lap with partial colectomy, ileostomy 02/09/11, lumbar spinal stenosis with bilateral foot drop s/p PLIF 01/30/11, brain aneurysm, seizures, HTN, former smoker, and multiple other surgeries including nephrectomy, total parathyroidectomy.  PCP is Dr. Blaine Hamper and Nephrologist is Dr. Florene Glen.  Her Cardiologist is Dr. Percival Spanish whom she just saw on 04/23/11.after a recent hospitalization for altered mental status. Per his notes, he was waiting to review her records from [facility] prior to making clearance recommendations.        Echo from 04/08/11 showed: - Left ventricle: The cavity size was normal. There was moderate concentric hypertrophy. Systolic function was vigorous. The estimated ejection fraction was in the range of 65% to 70%. Wall motion was normal; there were no regional wall motion abnormalities. Doppler parameters are consistent with abnormal left ventricular relaxation (grade 1 diastolic dysfunction). - Aortic valve: Leaflet opening appears severely restricted although this is not reflected in the valvular velocities or gradient. Valve mobility was restricted. There was mild to moderate stenosis by velocity. Valve area: 2.15cm^2(VTI). Valve area: 1.81cm^2 (Vmax). - Mitral valve: Calcified annulus. Moderately calcified leaflets . - Pulmonary arteries: Systolic pressure was mildly increased. PA peak pressure: 77mm Hg (S). Impressions: Consider TEE or cardiac cath to further evaluate aortic stenosis if clinically indicated. (So far, Dr. Percival Spanish has recommended repeat echo in 1 year.)  She had a stress echo at [facility] on 09/27/09 with echo portion showing normal LV size and systolic function, mild LVH, type I diastolic dysfunction, normal RV size and function, mild AS (valve  area by planimetery 1.5cm2, dimensionless index 0.36), mitral leaflets calcified with mildly restricted opening excursion, no significant diastolic gradient across the MV, mild to moderate TR, normal pulmonary pressure, no pericardial effusion.  Stress images were normal.  EF 55%.  Interpretation shows negative dobutamine stress echocardiography with no echocardiographic evidence of inducible ischemia.   EKG on 05/22/11 showed ST at 106 bpm, possible LAE, LAD, possible inferior infarct, anterior infarct (age undetermined).  LAD appeared new and her rate slower since her last EKG on 04/09/11.  CXR on 04/03/11 showed COPD with bibasilar scarring. No acute cardiopulmonary disease.  Labs noted.  She will need an ISTAT on the day of surgery.  I'll also order a T&S.  At this point, it doesn't appear Dr. Percival Spanish has reviewed her records from [facility], so I spoke with him today.  He will be in the cath lab on 05/27/11 and will review her records at that time and let me know if anything additional testing needed preoperatively.  (Copies of her stress echo were given to Card Master Trish to place in Dr. Rosezella Florida folder for review.)  Addendum: 05/28/11 1330  Received a message from Dr. Percival Spanish that patient is cleared to proceed from a cardiac standpoint.

## 2011-05-24 NOTE — Anesthesia Preprocedure Evaluation (Addendum)
Anesthesia Evaluation  Patient identified by MRN, date of birth, ID band Patient awake    Reviewed: Allergy & Precautions, H&P , NPO status , Patient's Chart, lab work & pertinent test results, reviewed documented beta blocker date and time   Airway Mallampati: II TM Distance: >3 FB Neck ROM: full    Dental   Pulmonary neg pulmonary ROS,          Cardiovascular hypertension, On Medications and On Home Beta Blockers + dysrhythmias + Valvular Problems/Murmurs AS  Mild - Mod AS (echo 03/2011)   Neuro/Psych Seizures -, Well Controlled,   Neuromuscular disease negative psych ROS   GI/Hepatic negative GI ROS, (+) Hepatitis -, B  Endo/Other  Diabetes mellitus-  Renal/GU negative Renal ROS  negative genitourinary   Musculoskeletal   Abdominal   Peds  Hematology negative hematology ROS (+)   Anesthesia Other Findings See surgeon's H&P   Reproductive/Obstetrics negative OB ROS                           Anesthesia Physical Anesthesia Plan  ASA: IV  Anesthesia Plan: General   Post-op Pain Management:    Induction: Intravenous  Airway Management Planned: Oral ETT  Additional Equipment: Arterial line  Intra-op Plan:   Post-operative Plan: Extubation in OR  Informed Consent: I have reviewed the patients History and Physical, chart, labs and discussed the procedure including the risks, benefits and alternatives for the proposed anesthesia with the patient or authorized representative who has indicated his/her understanding and acceptance.     Plan Discussed with: CRNA and Surgeon  Anesthesia Plan Comments:         Anesthesia Quick Evaluation

## 2011-05-28 DIAGNOSIS — Z01810 Encounter for preprocedural cardiovascular examination: Secondary | ICD-10-CM | POA: Insufficient documentation

## 2011-05-28 MED ORDER — ERTAPENEM SODIUM 1 G IJ SOLR
1.0000 g | INTRAMUSCULAR | Status: AC
  Administered 2011-05-29: 1 g via INTRAVENOUS
  Filled 2011-05-28: qty 1

## 2011-05-28 NOTE — Assessment & Plan Note (Signed)
I have reviewed outside records from 09/27/2009. The patient had a stress echo at [facility]. There was no evidence of ischemia or infarct. She had a well preserved ejection fraction. At this point no change in therapy is indicated. No further imaging is indicated. According to ACC/AHA guidelines the patient is at acceptable risk for the planned surgery.

## 2011-05-28 NOTE — H&P (Signed)
HPI  [name] is a 61 y.o. female.  HPIShe is here for a followup status post emergent surgery for ischemic right colon back on November 24. She had a long course and rehabilitation and he is a dialysis patient. She currently has an ileostomy. She is doing very well. She is here to discuss ileostomy reversal  Past Medical History   Diagnosis  Date   .  Seizures      r/t HTN in 1990's x 1   .  Brain aneurysm      No records could be found   .  Blood transfusion  1990's     r/t Kidney removal surgery   .  Arthritis      Back   .  Anemia      anemia of chronic disease likely 2/2 ESRD per last anemia panel (02/2011) with Fe 35, TIBC 167, ferritin 2041 // BL Hgb 8-10   .  Umbilical hernia  age 37   .  Heart murmur      Born with heart mumur, does not require follow up per pt   .  Hypertension      Does not see a heart doctor, had pre transplant stress test at [facility]   .  Diabetes mellitus      Borderline   .  End stage renal disease on dialysis      Secondary to hypertension // T/Th/Sat dialysis on [employer]   .  Hepatitis C    .  Colitis, ischemic  01/2011     S/P partial colectomy of right hemicolon and ileostomy placement   .  Lumbar spinal stenosis      bilateral L4-5 and L5-S1 posterior lumbar interbody arthrodesis and bilateral L4-S1 posterior lateral arthrodesis for lumbar stenosis dynamic lumbar spondylolisthesis lumbar spondylosis lumbar radiculopathy with foot drop // s/p surgery 01/2011 - see surgery section for details.    Past Surgical History   Procedure  Date   .  Nephrectomy  2010     right side done at [facility] on transplant list   .  Umbilical hernia repair  age 80- 65   .  Appendectomy  1960's   .  Vascular surgery      right arm dialysis graft   .  Tonsillectomy      as a child   .  Rib resection      d/t kidney removal   .  Back surgery  01/30/2011     L4-S1 lumbar laminectomy facetectomy and foraminotomies for decompression a bilateral L4-5  and L5-S1 posterior lumbar interbody arthrodesis and bilateral L4-S1 posterior lateral arthrodesis for lumbar stenosis dynamic lumbar spondylolisthesis lumbar spondylosis lumbar radiculopathy with foot drop   .  Laparotomy  02/09/2011     Procedure: EXPLORATORY LAPAROTOMY; Surgeon: Harl Bowie, MD; Location: [place]; Service: General; Laterality: N/A;   .  Partial colectomy  02/09/2011     Reason for surgery: ischemic colitis of right hemicolon; Surgeon: Harl Bowie, MD; Location: [place]; Service: General; Laterality: Right;   .  Colostomy  02/09/2011     Reason for surgery: ischemic colitis of right hemicolon requiring partial colectomy and ileostomy. Surgeon: Harl Bowie, MD; Location: [place]; Service: General; Laterality: Right;   .  Colonoscopy  02/19/2011     Procedure: COLONOSCOPY; Surgeon: Missy Sabins, MD; Location: [place]; Service: Endoscopy; Laterality: N/A;   .  Colonoscopy  03/07/2011     Procedure: COLONOSCOPY; Surgeon: Herbie Baltimore  Ursula Beath, MD; Location: [place] [facility]; Service: Endoscopy; Laterality: N/A; no prep needed--pt has long Hartmann's pouch and ileostomy   .  Total parathyroidectomy  01/17/2001     with autotransplantation into left forearm // due to secondary hyperparathyroidism from ESRD   .  Av fistula placement  04/08/2011     Procedure: INSERTION OF ARTERIOVENOUS (AV) GORE-TEX GRAFT ARM; Surgeon: Rosetta Posner, MD; Location: [facility]; Service: Vascular; Laterality: Right; Revision of Upper Arm Gore-Tex Graft    Family History   Problem  Relation  Age of Onset   .  Anesthesia problems  Neg Hx    .  Hypotension  Neg Hx    .  Malignant hyperthermia  Neg Hx    .  Pseudochol deficiency  Neg Hx    .  Diabetes  Father    .  Stroke  Mother     Social History  History   Substance Use Topics   .  Smoking status:  Former Smoker -- 0.5 packs/day for 48 years     Types:  Cigarettes     Quit date:  08/17/2010   .  Smokeless tobacco:  Never Used   .  Alcohol  Use:  1.2 oz/week     2 Shots of liquor per week      1 pint on Fridays    Allergies   Allergen  Reactions   .  Contrast Media (Iodinated Diagnostic Agents)      Seziure   .  Iohexol  Other (See Comments)     Reaction is convulsions    Current Outpatient Prescriptions   Medication  Sig  Dispense  Refill   .  darbepoetin (ARANESP) 200 MCG/0.4ML SOLN  Inject 200 mcg into the skin. Administered at dialysis per nephrology     .  gabapentin (NEURONTIN) 300 MG capsule  Take 1 capsule (300 mg total) by mouth daily. Started Friday PTA  30 capsule  0   .  metoprolol succinate (TOPROL-XL) 12.5 mg TB24  Take 0.5 tablets (12.5 mg total) by mouth daily.  30 tablet  0   .  multivitamin (RENA-VIT) TABS tablet  Take 1 tablet by mouth daily.  30 tablet  1   .  pantoprazole (PROTONIX) 40 MG tablet  Take 1 tablet (40 mg total) by mouth daily at 12 noon.  30 tablet  1   .  pregabalin (LYRICA) 25 MG capsule  Take 1 capsule (25 mg total) by mouth at bedtime as needed (burning pain in feet).  30 capsule  0    Review of Systems  Review of Systems  All other systems reviewed and are negative.   Blood pressure 98/68, pulse 68, temperature 97.2 F (36.2 C), temperature source Temporal, resp. rate 18, height 5\' 5"  (1.651 m), weight 92 lb 6.4 oz (41.912 kg).  Physical Exam  Physical Exam  Constitutional: She is oriented to person, place, and time. She appears well-developed and well-nourished. No distress.  HENT:  Head: Normocephalic and atraumatic.  Right Ear: External ear normal.  Left Ear: External ear normal.  Nose: Nose normal.  Eyes: Conjunctivae are normal. Pupils are equal, round, and reactive to light.  Neck: Neck supple. No tracheal deviation present. No thyromegaly present.  Cardiovascular: Normal rate, regular rhythm, normal heart sounds and intact distal pulses.  Pulmonary/Chest: Effort normal and breath sounds normal. No respiratory distress. She has no wheezes.  Abdominal: Soft. Bowel sounds  are normal.  Ileostomy is pink and well perfused.  Midline incision is clean  Musculoskeletal: Normal range of motion.  Lymphadenopathy:  She has no cervical adenopathy.  Neurological: She is alert and oriented to person, place, and time.  Skin: Skin is warm and dry. No rash noted. No erythema.  Psychiatric: Her behavior is normal. Judgment normal.   Data Reviewed  Assessment   Patient postop status post ileostomy. Multiple comorbidities including chronic renal failure on hemodialysis   Plan   She is eager to proceed with ileostomy reversal which I think is very reasonable. I discussed this with the patient and her daughters. I discussed the risk of surgery which includes but is not limited to bleeding, infection, anastomotic leak, wound infection, wound breakdown, anastomotic breakdown with need for repeat ileostomy, cardiopulmonary issues, et Ronney Asters. She wishes to proceed. Likelihood of success is good.  Cardiac clearance has also been obtained.  Will notify nephrology post op for dialysis as inpatient.   Kyon Bentler A

## 2011-05-29 ENCOUNTER — Inpatient Hospital Stay (HOSPITAL_COMMUNITY)
Admission: RE | Admit: 2011-05-29 | Discharge: 2011-06-05 | DRG: 344 | Disposition: A | Discharge status: Home Health | Payer: Medicare Other | Source: Ambulatory Visit | Attending: Surgery | Admitting: Surgery

## 2011-05-29 ENCOUNTER — Encounter (HOSPITAL_COMMUNITY): Payer: Self-pay | Admitting: Vascular Surgery

## 2011-05-29 ENCOUNTER — Ambulatory Visit (HOSPITAL_COMMUNITY): Payer: Medicare Other | Admitting: Vascular Surgery

## 2011-05-29 ENCOUNTER — Encounter (HOSPITAL_COMMUNITY)
Admission: RE | Disposition: A | Discharge status: Home Health | Payer: Self-pay | Source: Ambulatory Visit | Attending: Surgery

## 2011-05-29 ENCOUNTER — Encounter (HOSPITAL_COMMUNITY): Payer: Self-pay | Admitting: Surgery

## 2011-05-29 DIAGNOSIS — Z91041 Radiographic dye allergy status: Secondary | ICD-10-CM

## 2011-05-29 DIAGNOSIS — N2581 Secondary hyperparathyroidism of renal origin: Secondary | ICD-10-CM | POA: Diagnosis present

## 2011-05-29 DIAGNOSIS — Z432 Encounter for attention to ileostomy: Secondary | ICD-10-CM

## 2011-05-29 DIAGNOSIS — Z888 Allergy status to other drugs, medicaments and biological substances status: Secondary | ICD-10-CM

## 2011-05-29 DIAGNOSIS — B1920 Unspecified viral hepatitis C without hepatic coma: Secondary | ICD-10-CM | POA: Diagnosis present

## 2011-05-29 DIAGNOSIS — Z823 Family history of stroke: Secondary | ICD-10-CM

## 2011-05-29 DIAGNOSIS — Z87891 Personal history of nicotine dependence: Secondary | ICD-10-CM

## 2011-05-29 DIAGNOSIS — Z01812 Encounter for preprocedural laboratory examination: Secondary | ICD-10-CM

## 2011-05-29 DIAGNOSIS — I959 Hypotension, unspecified: Secondary | ICD-10-CM | POA: Diagnosis present

## 2011-05-29 DIAGNOSIS — N186 End stage renal disease: Secondary | ICD-10-CM | POA: Diagnosis present

## 2011-05-29 DIAGNOSIS — R5381 Other malaise: Secondary | ICD-10-CM | POA: Diagnosis present

## 2011-05-29 DIAGNOSIS — D649 Anemia, unspecified: Secondary | ICD-10-CM | POA: Diagnosis not present

## 2011-05-29 DIAGNOSIS — I359 Nonrheumatic aortic valve disorder, unspecified: Secondary | ICD-10-CM | POA: Diagnosis present

## 2011-05-29 DIAGNOSIS — Z905 Acquired absence of kidney: Secondary | ICD-10-CM

## 2011-05-29 DIAGNOSIS — IMO0002 Reserved for concepts with insufficient information to code with codable children: Secondary | ICD-10-CM

## 2011-05-29 DIAGNOSIS — G40909 Epilepsy, unspecified, not intractable, without status epilepticus: Secondary | ICD-10-CM | POA: Diagnosis present

## 2011-05-29 DIAGNOSIS — Z833 Family history of diabetes mellitus: Secondary | ICD-10-CM

## 2011-05-29 DIAGNOSIS — D631 Anemia in chronic kidney disease: Secondary | ICD-10-CM | POA: Diagnosis present

## 2011-05-29 DIAGNOSIS — I120 Hypertensive chronic kidney disease with stage 5 chronic kidney disease or end stage renal disease: Secondary | ICD-10-CM | POA: Diagnosis present

## 2011-05-29 DIAGNOSIS — E119 Type 2 diabetes mellitus without complications: Secondary | ICD-10-CM | POA: Diagnosis present

## 2011-05-29 DIAGNOSIS — E8809 Other disorders of plasma-protein metabolism, not elsewhere classified: Secondary | ICD-10-CM | POA: Diagnosis present

## 2011-05-29 DIAGNOSIS — E875 Hyperkalemia: Secondary | ICD-10-CM | POA: Diagnosis present

## 2011-05-29 HISTORY — PX: ILEOSTOMY CLOSURE: SHX1784

## 2011-05-29 LAB — GLUCOSE, CAPILLARY: Glucose-Capillary: 111 mg/dL — ABNORMAL HIGH (ref 70–99)

## 2011-05-29 LAB — BASIC METABOLIC PANEL
BUN: 22 mg/dL (ref 6–23)
CO2: 25 mEq/L (ref 19–32)
Calcium: 9.8 mg/dL (ref 8.4–10.5)
Chloride: 100 mEq/L (ref 96–112)
Creatinine, Ser: 5.59 mg/dL — ABNORMAL HIGH (ref 0.50–1.10)
GFR calc Af Amer: 9 mL/min — ABNORMAL LOW (ref >90)
GFR calc non Af Amer: 7 mL/min — ABNORMAL LOW (ref >90)
Glucose, Bld: 86 mg/dL (ref 70–99)
Potassium: 4.5 mEq/L (ref 3.5–5.1)
Sodium: 140 mEq/L (ref 135–145)

## 2011-05-29 LAB — CBC
Hemoglobin: 12.9 g/dL (ref 12.0–15.0)
MCH: 27.1 pg (ref 26.0–34.0)
MCV: 87.6 fL (ref 78.0–100.0)
RBC: 4.76 MIL/uL (ref 3.87–5.11)

## 2011-05-29 LAB — TYPE AND SCREEN: ABO/RH(D): B POS

## 2011-05-29 LAB — POCT I-STAT 4, (NA,K, GLUC, HGB,HCT): Sodium: 139 mEq/L (ref 135–145)

## 2011-05-29 SURGERY — CLOSURE, ILEOSTOMY
Anesthesia: General | Site: Abdomen | Wound class: Clean Contaminated

## 2011-05-29 MED ORDER — SODIUM CHLORIDE 0.9 % IV SOLN
INTRAVENOUS | Status: DC | PRN
  Administered 2011-05-29: 08:00:00 via INTRAVENOUS

## 2011-05-29 MED ORDER — ONDANSETRON HCL 4 MG/2ML IJ SOLN: 4.0000 mg | Freq: Four times a day (QID) | INTRAMUSCULAR | Status: DC | PRN

## 2011-05-29 MED ORDER — NALOXONE HCL 0.4 MG/ML IJ SOLN: 0.4000 mg | INTRAMUSCULAR | Status: DC | PRN

## 2011-05-29 MED ORDER — SODIUM CHLORIDE 0.9 % IV SOLN
INTRAVENOUS | Status: DC
  Administered 2011-05-29: 20:00:00 via INTRAVENOUS

## 2011-05-29 MED ORDER — CAMPHOR-MENTHOL 0.5-0.5 % EX LOTN
1.0000 "application " | TOPICAL_LOTION | Freq: Three times a day (TID) | CUTANEOUS | Status: DC | PRN
  Filled 2011-05-29: qty 222

## 2011-05-29 MED ORDER — SODIUM CHLORIDE 0.9 % IV SOLN: 100.0000 mL | INTRAVENOUS | Status: DC | PRN

## 2011-05-29 MED ORDER — FENTANYL CITRATE 0.05 MG/ML IJ SOLN
INTRAMUSCULAR | Status: DC | PRN
  Administered 2011-05-29: 50 ug via INTRAVENOUS
  Administered 2011-05-29 (×2): 100 ug via INTRAVENOUS
  Administered 2011-05-29: 50 ug via INTRAVENOUS
  Administered 2011-05-29 (×2): 100 ug via INTRAVENOUS

## 2011-05-29 MED ORDER — ALTEPLASE 2 MG IJ SOLR
2.0000 mg | Freq: Once | INTRAMUSCULAR | Status: AC | PRN
  Filled 2011-05-29: qty 2

## 2011-05-29 MED ORDER — DARBEPOETIN ALFA-POLYSORBATE 200 MCG/0.4ML IJ SOLN
200.0000 ug | Freq: Once | INTRAMUSCULAR | Status: AC
  Administered 2011-05-29: 200 ug via SUBCUTANEOUS
  Filled 2011-05-29: qty 0.4

## 2011-05-29 MED ORDER — ONDANSETRON HCL 4 MG PO TABS: 4.0000 mg | ORAL_TABLET | Freq: Four times a day (QID) | ORAL | Status: DC | PRN

## 2011-05-29 MED ORDER — RENA-VITE PO TABS
1.0000 | ORAL_TABLET | Freq: Every day | ORAL | Status: DC
  Administered 2011-05-29: 1 via ORAL
  Filled 2011-05-29 (×2): qty 1

## 2011-05-29 MED ORDER — PANTOPRAZOLE SODIUM 40 MG IV SOLR
40.0000 mg | Freq: Every day | INTRAVENOUS | Status: DC
  Administered 2011-05-29 – 2011-06-03 (×5): 40 mg via INTRAVENOUS
  Filled 2011-05-29 (×6): qty 40

## 2011-05-29 MED ORDER — MIDAZOLAM HCL 5 MG/5ML IJ SOLN
INTRAMUSCULAR | Status: DC | PRN
  Administered 2011-05-29: 1 mg via INTRAVENOUS

## 2011-05-29 MED ORDER — ONDANSETRON HCL 4 MG/2ML IJ SOLN
4.0000 mg | Freq: Four times a day (QID) | INTRAMUSCULAR | Status: DC | PRN
  Filled 2011-05-29: qty 2

## 2011-05-29 MED ORDER — MORPHINE SULFATE 2 MG/ML IJ SOLN: 0.0500 mg/kg | INTRAMUSCULAR | Status: DC | PRN

## 2011-05-29 MED ORDER — LIDOCAINE HCL (PF) 1 % IJ SOLN: 5.0000 mL | INTRAMUSCULAR | Status: DC | PRN

## 2011-05-29 MED ORDER — PENTAFLUOROPROP-TETRAFLUOROETH EX AERO: 1.0000 "application " | INHALATION_SPRAY | CUTANEOUS | Status: DC | PRN

## 2011-05-29 MED ORDER — HYDROMORPHONE HCL PF 1 MG/ML IJ SOLN: 0.2500 mg | INTRAMUSCULAR | Status: DC | PRN

## 2011-05-29 MED ORDER — ACETAMINOPHEN 325 MG PO TABS
650.0000 mg | ORAL_TABLET | Freq: Four times a day (QID) | ORAL | Status: DC | PRN
  Administered 2011-05-30 – 2011-06-01 (×2): 650 mg via ORAL
  Filled 2011-05-29 (×2): qty 2

## 2011-05-29 MED ORDER — GLYCOPYRROLATE 0.2 MG/ML IJ SOLN
INTRAMUSCULAR | Status: DC | PRN
  Administered 2011-05-29: 0.6 mg via INTRAVENOUS

## 2011-05-29 MED ORDER — HEPARIN SODIUM (PORCINE) 1000 UNIT/ML DIALYSIS
1000.0000 [IU] | INTRAMUSCULAR | Status: DC | PRN
  Filled 2011-05-29: qty 1

## 2011-05-29 MED ORDER — METOCLOPRAMIDE HCL 5 MG/ML IJ SOLN: 10.0000 mg | Freq: Once | INTRAMUSCULAR | Status: DC | PRN

## 2011-05-29 MED ORDER — ENOXAPARIN SODIUM 30 MG/0.3ML ~~LOC~~ SOLN
30.0000 mg | SUBCUTANEOUS | Status: DC
  Administered 2011-05-30 – 2011-06-04 (×6): 30 mg via SUBCUTANEOUS
  Filled 2011-05-29 (×8): qty 0.3

## 2011-05-29 MED ORDER — NEOSTIGMINE METHYLSULFATE 1 MG/ML IJ SOLN
INTRAMUSCULAR | Status: DC | PRN
  Administered 2011-05-29: 3 mg via INTRAVENOUS

## 2011-05-29 MED ORDER — ROCURONIUM BROMIDE 100 MG/10ML IV SOLN
INTRAVENOUS | Status: DC | PRN
  Administered 2011-05-29: 10 mg via INTRAVENOUS
  Administered 2011-05-29: 40 mg via INTRAVENOUS

## 2011-05-29 MED ORDER — ACETAMINOPHEN 650 MG RE SUPP: 650.0000 mg | Freq: Four times a day (QID) | RECTAL | Status: DC | PRN

## 2011-05-29 MED ORDER — SODIUM CHLORIDE 0.9 % IV BOLUS (SEPSIS): 500.0000 mL | Freq: Once | INTRAVENOUS | Status: DC

## 2011-05-29 MED ORDER — DARBEPOETIN ALFA-POLYSORBATE 200 MCG/0.4ML IJ SOLN
200.0000 ug | INTRAMUSCULAR | Status: DC
  Administered 2011-06-04: 200 ug via SUBCUTANEOUS

## 2011-05-29 MED ORDER — SODIUM CHLORIDE 0.9 % IV SOLN
Freq: Once | INTRAVENOUS | Status: AC
  Administered 2011-05-29: 20:00:00 via INTRAVENOUS

## 2011-05-29 MED ORDER — ZOLPIDEM TARTRATE 5 MG PO TABS: 5.0000 mg | ORAL_TABLET | Freq: Every evening | ORAL | Status: DC | PRN

## 2011-05-29 MED ORDER — SODIUM CHLORIDE 0.9 % IJ SOLN: 9.0000 mL | INTRAMUSCULAR | Status: DC | PRN

## 2011-05-29 MED ORDER — GABAPENTIN 300 MG PO CAPS
300.0000 mg | ORAL_CAPSULE | Freq: Every day | ORAL | Status: DC
  Administered 2011-05-29 – 2011-06-05 (×8): 300 mg via ORAL
  Filled 2011-05-29 (×8): qty 1

## 2011-05-29 MED ORDER — ONDANSETRON HCL 4 MG/2ML IJ SOLN
INTRAMUSCULAR | Status: DC | PRN
  Administered 2011-05-29 (×2): 4 mg via INTRAVENOUS

## 2011-05-29 MED ORDER — SORBITOL 70 % SOLN
30.0000 mL | Status: DC | PRN
  Filled 2011-05-29: qty 30

## 2011-05-29 MED ORDER — HYDROXYZINE HCL 25 MG PO TABS
25.0000 mg | ORAL_TABLET | Freq: Three times a day (TID) | ORAL | Status: DC | PRN
  Filled 2011-05-29: qty 1

## 2011-05-29 MED ORDER — PHENYLEPHRINE HCL 10 MG/ML IJ SOLN
INTRAMUSCULAR | Status: DC | PRN
  Administered 2011-05-29: 40 ug via INTRAVENOUS
  Administered 2011-05-29: 80 ug via INTRAVENOUS
  Administered 2011-05-29: 40 ug via INTRAVENOUS
  Administered 2011-05-29: 80 ug via INTRAVENOUS
  Administered 2011-05-29: 40 ug via INTRAVENOUS

## 2011-05-29 MED ORDER — LIDOCAINE-PRILOCAINE 2.5-2.5 % EX CREA
1.0000 "application " | TOPICAL_CREAM | CUTANEOUS | Status: DC | PRN
  Filled 2011-05-29: qty 5

## 2011-05-29 MED ORDER — NEPRO/CARBSTEADY PO LIQD
237.0000 mL | ORAL | Status: DC | PRN
  Filled 2011-05-29: qty 237

## 2011-05-29 MED ORDER — 0.9 % SODIUM CHLORIDE (POUR BTL) OPTIME
TOPICAL | Status: DC | PRN
  Administered 2011-05-29: 5000 mL

## 2011-05-29 MED ORDER — DIPHENHYDRAMINE HCL 12.5 MG/5ML PO ELIX
12.5000 mg | ORAL_SOLUTION | Freq: Four times a day (QID) | ORAL | Status: DC | PRN
  Filled 2011-05-29: qty 5

## 2011-05-29 MED ORDER — METOPROLOL TARTRATE 1 MG/ML IV SOLN
2.5000 mg | INTRAVENOUS | Status: DC | PRN
  Filled 2011-05-29: qty 5

## 2011-05-29 MED ORDER — MORPHINE SULFATE (PF) 1 MG/ML IV SOLN
INTRAVENOUS | Status: DC
Start: 2011-05-29 — End: 2011-06-02
  Administered 2011-05-29: 1 mg via INTRAVENOUS
  Administered 2011-05-29: 11:00:00 via INTRAVENOUS
  Administered 2011-05-29: 4 mg via INTRAVENOUS
  Administered 2011-05-30: 8 mg via INTRAVENOUS
  Administered 2011-05-30: 07:00:00 via INTRAVENOUS
  Administered 2011-05-30: 4 mg via INTRAVENOUS
  Administered 2011-05-30: 1 mg via INTRAVENOUS
  Administered 2011-05-31: 3 mg via INTRAVENOUS
  Administered 2011-05-31: 1 mg via INTRAVENOUS
  Administered 2011-05-31: 4 mg via INTRAVENOUS
  Administered 2011-06-01: 1 mg via INTRAVENOUS
  Administered 2011-06-01: 05:00:00 via INTRAVENOUS
  Administered 2011-06-01 (×2): 2 mg via INTRAVENOUS
  Administered 2011-06-01: 3 mg via INTRAVENOUS
  Administered 2011-06-01: 1 mg via INTRAVENOUS
  Administered 2011-06-02: 2 mg via INTRAVENOUS

## 2011-05-29 MED ORDER — CALCIUM CARBONATE 1250 MG/5ML PO SUSP
500.0000 mg | Freq: Four times a day (QID) | ORAL | Status: DC | PRN
  Filled 2011-05-29: qty 5

## 2011-05-29 MED ORDER — METOCLOPRAMIDE HCL 5 MG/ML IJ SOLN
INTRAMUSCULAR | Status: DC | PRN
  Administered 2011-05-29 (×2): 10 mg via INTRAVENOUS

## 2011-05-29 MED ORDER — DIPHENHYDRAMINE HCL 50 MG/ML IJ SOLN: 12.5000 mg | Freq: Four times a day (QID) | INTRAMUSCULAR | Status: DC | PRN

## 2011-05-29 MED ORDER — PHENYLEPHRINE HCL 10 MG/ML IJ SOLN
10000.0000 ug | INTRAMUSCULAR | Status: DC | PRN
  Administered 2011-05-29: 50 ug/min via INTRAVENOUS

## 2011-05-29 MED ORDER — DOCUSATE SODIUM 283 MG RE ENEM
1.0000 | ENEMA | RECTAL | Status: DC | PRN
  Filled 2011-05-29: qty 1

## 2011-05-29 MED ORDER — METOPROLOL SUCCINATE 12.5 MG HALF TABLET
12.5000 mg | ORAL_TABLET | Freq: Every day | ORAL | Status: DC
  Filled 2011-05-29 (×2): qty 1

## 2011-05-29 MED ORDER — ETOMIDATE 2 MG/ML IV SOLN
INTRAVENOUS | Status: DC | PRN
  Administered 2011-05-29: 12 mg via INTRAVENOUS

## 2011-05-29 MED ORDER — ERTAPENEM SODIUM 1 G IJ SOLR
1.0000 g | INTRAMUSCULAR | Status: AC
  Administered 2011-05-30: 1 g via INTRAVENOUS
  Filled 2011-05-29 (×2): qty 1

## 2011-05-29 MED ORDER — MORPHINE SULFATE 4 MG/ML IJ SOLN: 0.0500 mg/kg | INTRAMUSCULAR | Status: DC | PRN

## 2011-05-29 SURGICAL SUPPLY — 45 items
BLADE SURG ROTATE 9660 (MISCELLANEOUS) IMPLANT
CANISTER SUCTION 2500CC (MISCELLANEOUS) ×2 IMPLANT
CLOTH BEACON ORANGE TIMEOUT ST (SAFETY) ×2 IMPLANT
COVER SURGICAL LIGHT HANDLE (MISCELLANEOUS) ×2 IMPLANT
DRAPE LAPAROSCOPIC ABDOMINAL (DRAPES) ×2 IMPLANT
DRAPE WARM FLUID 44X44 (DRAPE) ×2 IMPLANT
DRESSING TELFA 8X3 (GAUZE/BANDAGES/DRESSINGS) ×1 IMPLANT
ELECT BLADE 6.5 EXT (BLADE) IMPLANT
ELECT CAUTERY BLADE 6.4 (BLADE) ×1 IMPLANT
ELECT REM PT RETURN 9FT ADLT (ELECTROSURGICAL) ×2
ELECTRODE REM PT RTRN 9FT ADLT (ELECTROSURGICAL) ×1 IMPLANT
GLOVE BIOGEL PI IND STRL 6.5 (GLOVE) IMPLANT
GLOVE BIOGEL PI INDICATOR 6.5 (GLOVE) ×2
GLOVE SURG SIGNA 7.5 PF LTX (GLOVE) ×4 IMPLANT
GLOVE SURG SS PI 6.5 STRL IVOR (GLOVE) ×2 IMPLANT
GLOVE SURG SS PI 7.5 STRL IVOR (GLOVE) ×3 IMPLANT
GOWN PREVENTION PLUS XLARGE (GOWN DISPOSABLE) ×4 IMPLANT
GOWN STRL NON-REIN LRG LVL3 (GOWN DISPOSABLE) ×1 IMPLANT
KIT BASIN OR (CUSTOM PROCEDURE TRAY) ×2 IMPLANT
KIT ROOM TURNOVER OR (KITS) ×2 IMPLANT
LIGASURE IMPACT 36 18CM CVD LR (INSTRUMENTS) IMPLANT
NS IRRIG 1000ML POUR BTL (IV SOLUTION) ×7 IMPLANT
PACK GENERAL/GYN (CUSTOM PROCEDURE TRAY) ×2 IMPLANT
PAD ARMBOARD 7.5X6 YLW CONV (MISCELLANEOUS) ×2 IMPLANT
SPECIMEN JAR MEDIUM (MISCELLANEOUS) IMPLANT
SPONGE GAUZE 4X4 12PLY (GAUZE/BANDAGES/DRESSINGS) ×2 IMPLANT
SPONGE LAP 18X18 X RAY DECT (DISPOSABLE) ×1 IMPLANT
STAPLER GUN LINEAR PROX 60 (STAPLE) ×1 IMPLANT
STAPLER PROXIMATE 75MM BLUE (STAPLE) ×1 IMPLANT
STAPLER VISISTAT 35W (STAPLE) ×2 IMPLANT
SUCTION POOLE TIP (SUCTIONS) ×2 IMPLANT
SUT NOVA NAB DX-16 0-1 5-0 T12 (SUTURE) ×2 IMPLANT
SUT PDS AB 1 TP1 96 (SUTURE) ×2 IMPLANT
SUT SILK 2 0 (SUTURE) ×2
SUT SILK 2 0 SH CR/8 (SUTURE) ×2 IMPLANT
SUT SILK 2-0 18XBRD TIE 12 (SUTURE) ×1 IMPLANT
SUT SILK 3 0 (SUTURE) ×2
SUT SILK 3 0 SH CR/8 (SUTURE) ×3 IMPLANT
SUT SILK 3-0 18XBRD TIE 12 (SUTURE) ×1 IMPLANT
TAPE CLOTH SURG 6X10 WHT LF (GAUZE/BANDAGES/DRESSINGS) ×1 IMPLANT
TOWEL OR 17X24 6PK STRL BLUE (TOWEL DISPOSABLE) ×2 IMPLANT
TOWEL OR 17X26 10 PK STRL BLUE (TOWEL DISPOSABLE) ×2 IMPLANT
TRAY FOLEY CATH 14FRSI W/METER (CATHETERS) ×2 IMPLANT
WATER STERILE IRR 1000ML POUR (IV SOLUTION) ×1 IMPLANT
YANKAUER SUCT BULB TIP NO VENT (SUCTIONS) ×2 IMPLANT

## 2011-05-29 NOTE — Interval H&P Note (Signed)
History and Physical Interval Note:  She has had no change in her history or exam  05/29/2011 7:47 AM  [name]  has presented today for surgery, with the diagnosis of ileostomy  The various methods of treatment have been discussed with the patient and family. After consideration of risks, benefits and other options for treatment, the patient has consented to  Procedure(s) (LRB): ILEOSTOMY TAKEDOWN (N/A) as a surgical intervention .  The patients' history has been reviewed, patient examined, no change in status, stable for surgery.  I have reviewed the patients' chart and labs.  Questions were answered to the patient's satisfaction.     Linzey Ramser A

## 2011-05-29 NOTE — Progress Notes (Signed)
Pt's BP drifting in 70's, very little pain or narcotic use, pt sleepy. Dr's Jonne Ply and Renal service aware. Orders obtained

## 2011-05-29 NOTE — Progress Notes (Signed)
Noted that pt had had CXR-2View in1/2013.  Ebony Hail reviewed no need to repeat.//L. Cassandra Harbold,RN

## 2011-05-29 NOTE — Consult Note (Signed)
[facility] Consult Note [facility]  Date: 05/29/2011  Patient name: [name] Medical record number: [id number] Date of birth: 05-Apr-1950 Age: 61 y.o. Gender: female PCP: Ivor Costa, MD, MD  Medical Service: Nephrology      Chief Complaint: S/P ileostomy takedown  History of Present Illness:  The patient is a 61 year old female with a history of ESRD on hemodialysis, RT nephrectomy,  HEP C, Seizures, Anemia, and secondary hyperparathyroidism who presents with the chief complaint of S/P ileostomy takedown. The patieint required an emergency surgery for an ischemic right colon in Nov. 2012. She had a long rehabilitation course and is currently in the PACU recovering from an ileostomy takedown that was done today. The pt has ESRD and is on hemodialysis. She receives her dialysis from [facility]. Her last Dialysis tx was on 05-28-11.  She is on a Tues, Thurs, Sat schedule.   Currently the patient states she is feeling okay. She states moderate pain from her surgical site. She rates the pain at a 6 of 10. She denies any fevers but does state she is having chills. She denies any chest pain or SOB.   Dialysis Orders: 3.5 hrs 2K 2.25 Ca 400/A 1.5 , tight heparin ; no EPO, Fe or Zemplar Right upper AVGG  Meds: Medications Prior to Admission  Medication Dose Route Frequency Provider Last Rate Last Dose  . ertapenem (INVANZ) 1 g in sodium chloride 0.9 % 50 mL IVPB  1 g Intravenous 60 min Pre-Op Harl Bowie, MD   1 g at 05/29/11 0835  . HYDROmorphone (DILAUDID) injection 0.25-0.5 mg  0.25-0.5 mg Intravenous Q5 min PRN Fulton Reek, MD      . metoCLOPramide (REGLAN) injection 10 mg  10 mg Intravenous Once PRN Fulton Reek, MD      . morphine 1 MG/ML PCA injection   Intravenous Q4H Harl Bowie, MD      . morphine 2 MG/ML injection 0.05 mg/kg  0.05 mg/kg Intravenous Q10 min PRN Fulton Reek, MD      . DISCONTD: 0.9 %  irrigation (POUR BTL)    PRN Harl Bowie, MD   5,000 mL at 05/29/11 0905   Medications Prior to Admission  Medication Sig Dispense Refill  . gabapentin (NEURONTIN) 300 MG capsule Take 300 mg by mouth daily. Started Friday PTA      . multivitamin (RENA-VIT) TABS tablet Take 1 tablet by mouth daily.      . pantoprazole (PROTONIX) 40 MG tablet Take 40 mg by mouth daily at 12 noon.       Allergies: Contrast media and Iohexol  Past Medical History  Diagnosis Date  . Seizures     r/t HTN in 1990's x 1  . Brain aneurysm     No records could be found  . Blood transfusion 1990's    r/t Kidney removal surgery  . Arthritis     Back  . Anemia     anemia of chronic disease likely 2/2 ESRD per last anemia panel (02/2011) with Fe 35, TIBC 167, ferritin 2041  // BL Hgb 8-10  . Umbilical hernia age 69  . Heart murmur     Born with heart mumur, does not require follow up per pt  . Hypertension     Does not see a heart doctor, had pre transplant stress test at [facility]    . End stage renal disease on dialysis     Secondary to hypertension //  T/Th/Sat dialysis on [employer]  . Hepatitis C   . Colitis, ischemic 01/2011    S/P partial colectomy of right hemicolon and ileostomy placement  . Lumbar spinal stenosis      bilateral L4-5 and L5-S1 posterior lumbar interbody arthrodesis and bilateral L4-S1 posterior lateral arthrodesis for lumbar stenosis dynamic lumbar spondylolisthesis lumbar spondylosis lumbar radiculopathy with foot drop // s/p surgery 01/2011 - see surgery section for details.  Marland Kitchen Dysrhythmia     SVT for brief period in Feb 2013  . Dialysis patient     [place]  . Diabetes mellitus     Borderline  . Foot drop, bilateral     after back surgery  . Anuria     Due to dialysis  . Aortic stenosis, mild     03/2011 echo   Past Surgical History  Procedure Date  . Nephrectomy 2010    right side done at [facility] on transplant list  . Umbilical  hernia repair age 68- 1  . Appendectomy 1960's  . Vascular surgery     right arm dialysis graft  . Tonsillectomy     as a child  . Rib resection     d/t kidney removal  . Back surgery 01/30/2011    L4-S1 lumbar laminectomy facetectomy and foraminotomies for decompression a bilateral L4-5 and L5-S1 posterior lumbar interbody arthrodesis and bilateral L4-S1 posterior lateral arthrodesis for lumbar stenosis dynamic lumbar spondylolisthesis lumbar spondylosis lumbar radiculopathy with foot drop  . Laparotomy 02/09/2011    Procedure: EXPLORATORY LAPAROTOMY;  Surgeon: Harl Bowie, MD;  Location: [place];  Service: General;  Laterality: N/A;  . Partial colectomy 02/09/2011    Reason for surgery: ischemic colitis of right hemicolon; Surgeon: Harl Bowie, MD;  Location: [place];  Service: General;  Laterality: Right;  . Colostomy 02/09/2011    Reason for surgery: ischemic colitis of right hemicolon requiring partial colectomy and ileostomy.  Surgeon: Harl Bowie, MD;  Location: [employer];  Service: General;  Laterality: Right;  . Colonoscopy 02/19/2011    Procedure: COLONOSCOPY;  Surgeon: Missy Sabins, MD;  Location: [place];  Service: Endoscopy;  Laterality: N/A;  . Colonoscopy 03/07/2011    Procedure: COLONOSCOPY;  Surgeon: Cleotis Nipper, MD;  Location: [facility] END[facility]COPY;  Service: Endoscopy;  Laterality: N/A;  no prep needed--pt has long Hartmann's pouch and ileostomy  . Total parathyroidectomy 01/17/2001    with autotransplantation into left forearm // due to secondary hyperparathyroidism from ESRD  . Av fistula placement 04/08/2011    Procedure: INSERTION OF ARTERIOVENOUS (AV) GORE-TEX GRAFT ARM;  Surgeon: Rosetta Posner, MD;  Location: [facility] OR;  Service: Vascular;  Laterality: Right;  Revision of Upper Arm Gore-Tex Graft   Family History  Problem Relation Age of Onset  . Anesthesia problems Neg Hx   . Hypotension Neg Hx   . Malignant hyperthermia Neg Hx   . Pseudochol deficiency  Neg Hx   . Diabetes Father   . Stroke Mother    History   Social History  . Marital Status: Legally Separated    Spouse Name: N/A    Number of Children: 2  . Years of Education: 10th grade   Occupational History  . Retired     previously worked as a Producer, television/film/video History Main Topics  . Smoking status: Former Smoker -- 0.5 packs/day for 48 years    Types: Cigarettes    Quit date: 08/17/2010  . Smokeless tobacco: Never Used  .  Alcohol Use: No     1 pint on Fridays  . Drug Use: No  . Sexually Active: No   Other Topics Concern  . Not on file   Social History Narrative  . No narrative on file    Review of Systems: As stated in HPI. ROS are difficult to assess due to patients post surgical sedation.   Physical Exam: Blood pressure 85/40, pulse 118, temperature 99.2 F (37.3 C), temperature source Oral, resp. rate 11, SpO2 94.00%.  Physical Exam  Constitutional: She is alert to verbal stimuli but is still quite sedated. She is able to answer questions but her responses are limited. She is shivering and states she is cold.   Head: Normocephalic and atraumatic.  Eyes: Conjunctivae are normal. Pupils are equal, round, and reactive to light.  Neck: Neck supple. No tracheal deviation present. No thyromegaly present.  Mouth- Mucus membranes appear dry. Cardiovascular: Tachycardic, regular rhythm, grade 3/6 systolic murmur noted most prominent at RUSB Pulmonary/Chest: Shallow respirations with decreased breath sounds bilaterally. No respiratory distress. She has no wheezes, rales, rhonchi. She notes pain from her surgical site with deep inspiration Abdominal: Abdomen is bandaged. There is no evidence of bleeding through the bandage. I can not appreciate any bowel sounds Musculoskeletal: unable to asses strength due to patients post surgical condition  Neurological: She is alert and oriented to person, place, and time but is heavily sedated as she is still recovering from her  surgery.  Skin: Skin is warm and dry. No rash noted. No erythema.  Extremities: Trace pedal edema is noted. Peripheral pulses 1+ and equal in all extremities. No cyanosis or clubbing noted.  Access: RUA graft    Lab results: Basic Metabolic Panel:  Lab [id number] 1330 05/29/11 0741  NA 140 139  K 4.5 4.6  CL 100 --  CO2 25 --  GLUCOSE 86 87  BUN 22 --  CREATININE 5.59* --  CALCIUM 9.8 --  ALB -- --  PHOS -- --    CBC:  Lab 05/29/11 1330 05/29/11 0741  WBC 13.4* --  NEUTROABS -- --  HGB 12.9 12.9  HCT 41.7 38.0  MCV 87.6 --  PLT 230 --   CBG:  Lab 05/29/11 1041  GLUCAP 90    Micro Results: Recent Results (from the past 240 hour(s))  SURGICAL PCR SCREEN     Status: Normal   Collection Time   05/22/11 10:44 AM      Component Value Range Status Comment   MRSA, PCR NEGATIVE  NEGATIVE  Final    Staphylococcus aureus NEGATIVE  NEGATIVE  Final    Studies/Results: No results found.   Assessment & Plan by Problem: 1. S/P ileostomy takedown- The patient underwent an ileostomy takedown today. She is currently recovering in PACU. The surgery was performed without complications. She is awaiting a bed in 3300.  2. Hypotension- The patient is currently hypotensive. Her last BP was 85/40. She also is tachycardic with rates in the 115-130 range. The patient is responsive and does not appear to be in any acute distress. She did not receive any blood products during surgery. Her HGB is  12.9 which may be a result of some hemoconcentration. The patients mucus membranes appear dry. Peripheral pulses are 1+ and equal in all extremities. I would consider continuing IV fluids with a bolus if the patient becomes symptomatic.  3. ESRD- The patient is on hemodilysis with her last treatment being yesterday as an outpatient at [facility].  She will most likely require dialysis during her hospitalization. SCR 5.59, BUN- 22, K- 4.6, CA- 9.8. Consider repeating BMP in AM 4.  Anemia- Stable at this time. Continue to follow. Consider repeating CBC in AM. 5. Secondary Hypoparathyroidism- s/p parathyroidectomy, no Vit D needed 8. Hepatitis C- 9. Aortic Stenosis- 10. Seizures- Currently not on any anti-sezure meds as an outpatient     This is a Careers information officer Note.  The care of the patient was discussed with Dr. Jonnie Finner and the assessment and plan was formulated with their assistance.  Please see their note for official documentation of the patient encounter.   Signed: Gabriel Rainwater PA-S2 05/29/2011, 3:17 PM   Patient seen and examined and agree with assessment and plan as above.  The patient is s/p reversal of ileostomy earlier today. She had some intraoperative hypotension which required transient pressor support, and she received fluid boluses with good effect.  She is not volume overloaded, may be a bit dry.  She is due for dialysis tomorrow, no fluid off- see orders.  Otherwise stable, see recommendations above.  Will follow.  Kelly Splinter  MD [facility] [phone] pgr    [phone] cell 05/29/2011, 6:49 PM

## 2011-05-29 NOTE — Op Note (Signed)
05/29/2011  10:26 AM  PATIENT:  [name]  61 y.o. female  PRE-OPERATIVE DIAGNOSIS:  ileostomy  POST-OPERATIVE DIAGNOSIS: same  PROCEDURE:  Procedure(s) (LRB): ILEOSTOMY TAKEDOWN (N/A)  SURGEON:  Surgeon(s) and Role:    * Harl Bowie, MD - Primary    * Madilyn Hook, DO - Assisting  PHYSICIAN ASSISTANT:   ASSISTANTS: none   ANESTHESIA:   general  EBL:  Total I/O In: -  Out: 75 [Blood:75]  BLOOD ADMINISTERED:none  DRAINS: none   LOCAL MEDICATIONS USED:  NONE  SPECIMEN:  Excision  DISPOSITION OF SPECIMEN:  PATHOLOGY  COUNTS:  YES  TOURNIQUET:  * No tourniquets in log *  DICTATION: Viviann Spare Dictation Indications: This is a 61 year old female with multiple medical problems including end-stage renal disease on hemodialysis. She underwent an emergent right hemicolectomy for ischemia with ileostomy placement in late 2012. She now presents for ileostomy takedown.  Procedure: The patient was brought to the operating room and identified as the correct patient. She was placed supine on the operating room table and general anesthesia was induced. I closed the ileostomy with a silk running suture. Her abdomen was then prepped and draped in the usual sterile fashion. A midline incision was then opened with a scalpel. This down to the fascia with electrocautery. Peritoneum was then opened the entire length of the incision. Upon the abdomen several small bowel were stuck up to the abdominal wall. A couple serosal injuries were created trying to get this down the serosal injuries were repaired with silk sutures. Extensive lyses of adhesions were then undertaken with Metzenbaum scissors freeing up all the small bowel. I then turned my attention to the ileostomy. I made an elliptical incision around the ileostomy with a scalpel and introduced down to the fascia with the cautery. I then freed up the attachments to the ileostomy and inverted back into the abdominal cavity.  Identified the remaining transverse colon. We then ran the small bowel in its entirety and again on the scrotal injuries appeared identified. There was one full-thickness injury just proximal to ileostomy which is included in the resection of the ileostomy. I reapproximated the small bowel to the colon in a side-to-side fashion with silk sutures. I created an enterotomy and colotomy with the cautery. A side-to-side anastomosis was then created with a single firing of the GIA-75 stapler. The open end and redundant ostomy were transected and closed with a TA 60 stapler. The specimen was then sent to pathology for evaluation. I closed the mesenteric defect with interrupted silk sutures. I then reinforced the TA 60 staple line the sutures. I then inserted omentum over the anastomosis as well. I patent, well-perfused anastomosis appeared to be achieved. I then irrigated the abdomen with several liters of normal saline. Hemostasis appeared to be achieved. I closed the fascia at the ileostomy site with interrupted #1 Novafil sutures in 2 layers. I then closed the midline with a running #1 looped PDS suture. The incisions were irrigated and closed with staples. I then placed Telfa wicks in between the staples. Gauze and tape were applied. The patient tolerated the procedure well. All the counts were correct at the end of the procedure. The patient was then extubated in the operating room and taken in a stable condition to the recovery room. PLAN OF CARE: Admit to inpatient   PATIENT DISPOSITION:  PACU - hemodynamically stable.   Delay start of Pharmacological VTE agent (>24hrs) due to surgical blood loss or risk of bleeding: no

## 2011-05-29 NOTE — Anesthesia Postprocedure Evaluation (Signed)
Anesthesia Post Note  Patient: [name]  Procedure(s) Performed: Procedure(s) (LRB): ILEOSTOMY TAKEDOWN (N/A)  Anesthesia type: general  Patient location: PACU  Post pain: Pain level controlled  Post assessment: Patient's Cardiovascular Status Stable  Last Vitals:  Filed Vitals:   05/29/11 1040  BP:   Pulse:   Temp:   Resp: 12    Post vital signs: Reviewed and stable  Level of consciousness: sedated  Complications: No apparent anesthesia complications

## 2011-05-29 NOTE — Transfer of Care (Signed)
Immediate Anesthesia Transfer of Care Note  Patient: [name]  Procedure(s) Performed: Procedure(s) (LRB): ILEOSTOMY TAKEDOWN (N/A)  Patient Location: PACU  Anesthesia Type: General  Level of Consciousness: oriented and sedated  Airway & Oxygen Therapy: Patient Spontanous Breathing and Patient connected to nasal cannula oxygen  Post-op Assessment: Report given to PACU RN and Post -op Vital signs reviewed and stable  Post vital signs: stable  Complications: No apparent anesthesia complications

## 2011-05-30 ENCOUNTER — Inpatient Hospital Stay (HOSPITAL_COMMUNITY): Payer: Medicare Other

## 2011-05-30 LAB — CBC
HCT: 28.8 % — ABNORMAL LOW (ref 36.0–46.0)
Hemoglobin: 10.5 g/dL — ABNORMAL LOW (ref 12.0–15.0)
Hemoglobin: 9.1 g/dL — ABNORMAL LOW (ref 12.0–15.0)
MCH: 26.7 pg (ref 26.0–34.0)
MCH: 26.8 pg (ref 26.0–34.0)
MCHC: 31.3 g/dL (ref 30.0–36.0)
MCHC: 31.6 g/dL (ref 30.0–36.0)
MCV: 85 fL (ref 78.0–100.0)
Platelets: 259 10*3/uL (ref 150–400)
Platelets: 231 10*3/uL (ref 150–400)
RBC: 3.93 MIL/uL (ref 3.87–5.11)
RBC: 3.39 MIL/uL — ABNORMAL LOW (ref 3.87–5.11)
RDW: 16.4 % — ABNORMAL HIGH (ref 11.5–15.5)
WBC: 9.7 10*3/uL (ref 4.0–10.5)

## 2011-05-30 LAB — RENAL FUNCTION PANEL
Albumin: 2.4 g/dL — ABNORMAL LOW (ref 3.5–5.2)
BUN: 30 mg/dL — ABNORMAL HIGH (ref 6–23)
CO2: 24 mEq/L (ref 19–32)
Calcium: 8.5 mg/dL (ref 8.4–10.5)
Chloride: 105 mEq/L (ref 96–112)
Creatinine, Ser: 6.84 mg/dL — ABNORMAL HIGH (ref 0.50–1.10)
GFR calc Af Amer: 7 mL/min — ABNORMAL LOW (ref >90)
GFR calc non Af Amer: 6 mL/min — ABNORMAL LOW (ref >90)
Glucose, Bld: 95 mg/dL (ref 70–99)
Phosphorus: 4.4 mg/dL (ref 2.3–4.6)
Potassium: 5.1 mEq/L (ref 3.5–5.1)
Sodium: 137 mEq/L (ref 135–145)

## 2011-05-30 LAB — BASIC METABOLIC PANEL
Calcium: 8.8 mg/dL (ref 8.4–10.5)
GFR calc Af Amer: 7 mL/min — ABNORMAL LOW (ref >90)
GFR calc non Af Amer: 6 mL/min — ABNORMAL LOW (ref >90)
Glucose, Bld: 93 mg/dL (ref 70–99)
Potassium: 5.4 mEq/L — ABNORMAL HIGH (ref 3.5–5.1)
Sodium: 140 mEq/L (ref 135–145)

## 2011-05-30 LAB — GLUCOSE, CAPILLARY
Glucose-Capillary: 139 mg/dL — ABNORMAL HIGH (ref 70–99)
Glucose-Capillary: 74 mg/dL (ref 70–99)

## 2011-05-30 LAB — HEMOGLOBIN AND HEMATOCRIT, BLOOD
HCT: 28.4 % — ABNORMAL LOW (ref 36.0–46.0)
Hemoglobin: 9.1 g/dL — ABNORMAL LOW (ref 12.0–15.0)

## 2011-05-30 MED ORDER — SODIUM CHLORIDE 0.9 % IV BOLUS (SEPSIS)
250.0000 mL | Freq: Once | INTRAVENOUS | Status: AC
  Administered 2011-05-30: 250 mL via INTRAVENOUS

## 2011-05-30 MED ORDER — SODIUM CHLORIDE 0.9 % IV SOLN
500.0000 mL | Freq: Once | INTRAVENOUS | Status: AC
  Administered 2011-05-30: 500 mL via INTRAVENOUS

## 2011-05-30 MED ORDER — RENA-VITE PO TABS
1.0000 | ORAL_TABLET | Freq: Every day | ORAL | Status: DC
  Administered 2011-05-31 – 2011-06-04 (×5): 1 via ORAL
  Filled 2011-05-30 (×7): qty 1

## 2011-05-30 MED ORDER — DEXTROSE 50 % IV SOLN
25.0000 mL | Freq: Once | INTRAVENOUS | Status: AC | PRN
  Administered 2011-05-30: 25 mL via INTRAVENOUS

## 2011-05-30 MED ORDER — DEXTROSE 50 % IV SOLN
INTRAVENOUS | Status: AC
  Administered 2011-05-30: 25 mL via INTRAVENOUS
  Filled 2011-05-30: qty 50

## 2011-05-30 MED ORDER — MORPHINE SULFATE (PF) 1 MG/ML IV SOLN
INTRAVENOUS | Status: AC
  Administered 2011-05-31: 5 mg via INTRAVENOUS
  Filled 2011-05-30: qty 25

## 2011-05-30 NOTE — Progress Notes (Signed)
HR still 130s, pain now rated 2/10 with use of PCA after return from HD.  BP 98/56, pt asymptomatic.  Dr. Hulen Skains notified, orders received for stat H/H.  Dr. Jonnie Finner notified, order received for NS bolus.    Vista Lawman, RN

## 2011-05-30 NOTE — Progress Notes (Signed)
PA Student Daily Progress Note [name] Subjective: Pt states she is doing well. She is currently undergoing dialysis. She describes the pain from her surgical site as "on and off." Currently she rates the pain at a 5 of 10. She denies any fevers, chills, or night sweats. She denies and Cp or SOB. She denies any N/V.  Objective:  Filed Vitals:   05/29/11 2350 05/30/11 0410 05/30/11 0700 05/30/11 0728  BP: 92/68 94/60    Pulse: 126 118    Temp: 99.9 F (37.7 C) 97.6 F (36.4 C)  99.6 F (37.6 C)  TempSrc: Oral Oral  Oral  Resp: 24 18 18    Height:      Weight:      SpO2: 99% 96% 98%    Physical Exam: General- Pt is currently on dialysis. She is alert and oriented. She does not appear to be in any acute distress. Head: Normocephalic and atraumatic.  Eyes: Conjunctivae are normal. Pupils are equal, round, and reactive to light.  Neck: Neck supple. No tracheal deviation present. No thyromegaly present.  Cardiovascular: Tachycardic, regular rhythm, grade 3/6 systolic murmur noted most prominent at RUSB Pulmonary- Grossly CTAB. No wheezes, rales, rhonchi noted. Abdominal: Abdomen is bandaged. There is no evidence of bleeding through the bandage. +BS Neurological: She is alert and oriented to person, place, and time. She is moving all extremities without difficulty. She is responding appropriately.   Skin: Skin is warm and dry. No rash noted. No erythema.  Extremities:  Peripheral pulses 1+ and equal in all extremities. No cyanosis, clubbing, or edema noted.  Access: RUA graft   Assessment/Plan: Assessment & Plan by Problem:  1. S/P ileostomy takedown- The patient underwent an ileostomy takedown yesterday. The surgery was performed without complications. She appears to be recovering well. She rates the pain form her surgical site at a 5 of 10. No evidence of bleeding through her bandage. Bowel sounds are present and active.  2. Hypotension- Pt is still hypotensive but  is much improved from yesterday. She is still tachycardic but this appears to be a normal finding for her. She is on dialysis now and orders are for no fluid off. Follow closely. Will stop IVF's.   3. ESRD- The patient is currently on hemodialysis. She has a Tues, Thurs, Sat schedule. Consider repeat BMP in am. 4. Anemia- Stable at this time. Continue to follow. Consider repeating CBC in AM. She is receiving darbepoetin. 5. Hyperkalemia- Pt is currently on dialysis with a 2 K bath 6. Secondary Hypoparathyroidism- s/p parathyroidectomy, no Vit D needed  7.  Hepatitis C-  8.  Aortic Stenosis-  9. Seizures- Currently not on any anti-sezure meds as an outpatient  Labs: Basic Metabolic Panel:  Lab [id number] 0737 05/29/11 1330 05/29/11 0741  NA 140 140 139  K 5.4* 4.5 4.6  CL 106 100 --  CO2 24 25 --  GLUCOSE 93 86 87  BUN 30* 22 --  CREATININE 6.95* 5.59* --  CALCIUM 8.8 9.8 --  ALB -- -- --  PHOS -- -- --    CBC:  Lab 05/30/11 0737 05/29/11 1330 05/29/11 0741  WBC 10.6* 13.4* --  NEUTROABS -- -- --  HGB 10.5* 12.9 12.9  HCT 33.5* 41.7 38.0  MCV 85.2 87.6 --  PLT 259 230 --    CBG:  Lab 05/30/11 0727 05/30/11 0410 05/29/11 2352 05/29/11 2023 05/29/11 1652  GLUCAP 88 108* 104* 111* 112*   Medications: Scheduled Meds:   . sodium  chloride   Intravenous Once  . darbepoetin  200 mcg Subcutaneous Once  . darbepoetin  200 mcg Subcutaneous Q Wed-HD  . enoxaparin  30 mg Subcutaneous Q24H  . ertapenem (INVANZ) IV  1 g Intravenous Q24H  . gabapentin  300 mg Oral Daily  . metoprolol succinate  12.5 mg Oral Daily  . morphine   Intravenous Q4H  . morphine      . multivitamin  1 tablet Oral Daily  . pantoprazole (PROTONIX) IV  40 mg Intravenous Q breakfast  . sodium chloride  500 mL Intravenous Once   Continuous Infusions:   . sodium chloride 75 mL/hr at 05/29/11 2001   PRN Meds:.sodium chloride, sodium chloride, acetaminophen, acetaminophen, alteplase, calcium carbonate  (dosed in mg elemental calcium), camphor-menthol, diphenhydrAMINE, diphenhydrAMINE, docusate sodium, feeding supplement (NEPRO CARB STEADY), heparin, hydrOXYzine, lidocaine, lidocaine-prilocaine, metoprolol, naloxone, ondansetron (ZOFRAN) IV, ondansetron (ZOFRAN) IV, ondansetron (ZOFRAN) IV, ondansetron, ondansetron, pentafluoroprop-tetrafluoroeth sodium chloride, sorbitol, zolpidem, DISCONTD: 0.9 % irrigation (POUR BTL), DISCONTD: HYDROmorphone, DISCONTD: metoCLOPramide, DISCONTD: morphine, DISCONTD: morphine   This is a Careers information officer Note.  The care of the patient was discussed with Dr. Jonnie Finner and the assessment and plan formulated with their assistance.  Please see their attached note for official documentation of the daily encounter.  Gabriel Rainwater PA-S2 05/30/2011, 9:03 AM  Patient seen and examined and agree with assessment and plan as above. Patient had some postop hypotension treated with IVF's.  BP low but better.  No UF with HD today. Rec's otherwise as above.  Kelly Splinter  MD [facility] [phone] pgr    [phone] cell 05/30/2011, 2:17 PM

## 2011-05-30 NOTE — Progress Notes (Signed)
UR complete 

## 2011-05-30 NOTE — Clinical Documentation Improvement (Signed)
BMI DOCUMENTATION CLARIFICATION QUERY  THIS DOCUMENT IS NOT A PERMANENT PART OF THE MEDICAL RECORD  TO RESPOND TO THE THIS QUERY, FOLLOW THE INSTRUCTIONS BELOW:  1. If needed, update documentation for the patient's encounter via the notes activity.  2. Access this query again and click edit on the In [employer].  3. After updating, or not, click F2 to complete all highlighted (required) fields concerning your review. Select "additional documentation in the medical record" OR "no additional documentation provided".  4. Click Sign note button.  5. The deficiency will fall out of your In Basket *Please let us know if you are not able to complete this workflow by phone or e-mail (listed below).         05/30/11  Dear Dr. Coralie Keens Rolley Sims  In an effort to better capture your patient's severity of illness, reflect appropriate length of stay and utilization of resources, a review of the patient medical record has revealed the following indicators.    Based on your clinical judgment, please clarify and document in a progress note and/or discharge summary the clinical condition associated with the following supporting information:  In responding to this query please exercise your independent judgment.  The fact that a query is asked, does not imply that any particular answer is desired or expected.  Possible Clinical conditions  Underweight w/BMI=17.8  Other condition___________________  Cannot Clinically determine _____________  Risk Factors: Sign & Symptoms: Weight: 107 lbs. Height 47ft  5in. BMI= 17.8    Reviewed: additional documentation in the medical record on discharge  Thank You,  Serena Colonel RN, BSN, CCM   Clinical Documentation Specialist:  Pager [phone] Health Information Management [place]

## 2011-05-30 NOTE — Progress Notes (Signed)
Patient ID: [name], female   DOB: [birth year], 61 y.o.   MRN: [id number] <principal problem not specified>   Subjective: Comfortable.  Currently undergoing dialysis.  Persistent tachycardia as preop  Objective: Vital signs in last 24 hours: Temp:  [97.2 F (36.2 C)-99.9 F (37.7 C)] 99.6 F (37.6 C) (03/14 0728) Pulse Rate:  [110-138] 118  (03/14 0410) Resp:  [4-33] 18  (03/14 0700) BP: (72-131)/(25-86) 94/60 mmHg (03/14 0410) SpO2:  [85 %-100 %] 98 % (03/14 0700) Arterial Line BP: (97-142)/(54-66) 104/55 mmHg (03/13 1127) FiO2 (%):  [2 %] 2 % (03/14 0410) Weight:  [107 lb (48.535 kg)] 107 lb (48.535 kg) (03/13 1914)    Intake/Output from previous day: 03/13 0701 - 03/14 0700 In: 1960 [I.V.:1950; IV Piggyback:10] Out: 75 [Blood:75] Intake/Output this shift: Total I/O In: 75 [I.V.:75] Out: -   General appearance: alert and no distress Resp: clear to auscultation bilaterally Incision/Wound: dressing dry. Abdomen flat, soft  Lab Results:  Results for orders placed during the hospital encounter of 05/29/11 (from the past 24 hour(s))  GLUCOSE, CAPILLARY     Status: Normal   Collection Time   05/29/11 10:41 AM      Component Value Range   Glucose-Capillary 90  70 - 99 (mg/dL)  CBC     Status: Abnormal   Collection Time   05/29/11  1:30 PM      Component Value Range   WBC 13.4 (*) 4.0 - 10.5 (K/uL)   RBC 4.76  3.87 - 5.11 (MIL/uL)   Hemoglobin 12.9  12.0 - 15.0 (g/dL)   HCT 41.7  36.0 - 46.0 (%)   MCV 87.6  78.0 - 100.0 (fL)   MCH 27.1  26.0 - 34.0 (pg)   MCHC 30.9  30.0 - 36.0 (g/dL)   RDW 16.6 (*) 11.5 - 15.5 (%)   Platelets 230  150 - 400 (K/uL)  BASIC METABOLIC PANEL     Status: Abnormal   Collection Time   05/29/11  1:30 PM      Component Value Range   Sodium 140  135 - 145 (mEq/L)   Potassium 4.5  3.5 - 5.1 (mEq/L)   Chloride 100  96 - 112 (mEq/L)   CO2 25  19 - 32 (mEq/L)   Glucose, Bld 86  70 - 99 (mg/dL)   BUN 22  6 - 23 (mg/dL)   Creatinine,  Ser 5.59 (*) 0.50 - 1.10 (mg/dL)   Calcium 9.8  8.4 - 10.5 (mg/dL)   GFR calc non Af Amer 7 (*) >90 (mL/min)   GFR calc Af Amer 9 (*) >90 (mL/min)  GLUCOSE, CAPILLARY     Status: Abnormal   Collection Time   05/29/11  4:52 PM      Component Value Range   Glucose-Capillary 112 (*) 70 - 99 (mg/dL)   Comment 1 Notify RN    GLUCOSE, CAPILLARY     Status: Abnormal   Collection Time   05/29/11  8:23 PM      Component Value Range   Glucose-Capillary 111 (*) 70 - 99 (mg/dL)   Comment 1 Documented in Chart     Comment 2 Notify RN    GLUCOSE, CAPILLARY     Status: Abnormal   Collection Time   05/29/11 11:52 PM      Component Value Range   Glucose-Capillary 104 (*) 70 - 99 (mg/dL)   Comment 1 Documented in Chart     Comment 2 Notify RN    GLUCOSE,  CAPILLARY     Status: Abnormal   Collection Time   05/30/11  4:10 AM      Component Value Range   Glucose-Capillary 108 (*) 70 - 99 (mg/dL)  GLUCOSE, CAPILLARY     Status: Normal   Collection Time   05/30/11  7:27 AM      Component Value Range   Glucose-Capillary 88  70 - 99 (mg/dL)   Comment 1 Documented in Chart     Comment 2 Notify RN    BASIC METABOLIC PANEL     Status: Abnormal   Collection Time   05/30/11  7:37 AM      Component Value Range   Sodium 140  135 - 145 (mEq/L)   Potassium 5.4 (*) 3.5 - 5.1 (mEq/L)   Chloride 106  96 - 112 (mEq/L)   CO2 24  19 - 32 (mEq/L)   Glucose, Bld 93  70 - 99 (mg/dL)   BUN 30 (*) 6 - 23 (mg/dL)   Creatinine, Ser 6.95 (*) 0.50 - 1.10 (mg/dL)   Calcium 8.8  8.4 - 10.5 (mg/dL)   GFR calc non Af Amer 6 (*) >90 (mL/min)   GFR calc Af Amer 7 (*) >90 (mL/min)  CBC     Status: Abnormal   Collection Time   05/30/11  7:37 AM      Component Value Range   WBC 10.6 (*) 4.0 - 10.5 (K/uL)   RBC 3.93  3.87 - 5.11 (MIL/uL)   Hemoglobin 10.5 (*) 12.0 - 15.0 (g/dL)   HCT 33.5 (*) 36.0 - 46.0 (%)   MCV 85.2  78.0 - 100.0 (fL)   MCH 26.7  26.0 - 34.0 (pg)   MCHC 31.3  30.0 - 36.0 (g/dL)   RDW 16.6 (*) 11.5 -  15.5 (%)   Platelets 259  150 - 400 (K/uL)     Studies/Results Radiology     MEDS, Scheduled    . sodium chloride   Intravenous Once  . darbepoetin  200 mcg Subcutaneous Once  . darbepoetin  200 mcg Subcutaneous Q Wed-HD  . enoxaparin  30 mg Subcutaneous Q24H  . ertapenem (INVANZ) IV  1 g Intravenous Q24H  . gabapentin  300 mg Oral Daily  . metoprolol succinate  12.5 mg Oral Daily  . morphine   Intravenous Q4H  . morphine      . multivitamin  1 tablet Oral Daily  . pantoprazole (PROTONIX) IV  40 mg Intravenous Q breakfast  . sodium chloride  500 mL Intravenous Once     Assessment: <principal problem not specified> POD#1 s/p ileostomy takedown  Plan:  Keep on ice chips  Follow H/H   LOS: 1 day    Coralie Keens, MD, [facility], [place]   05/30/2011 9:06 AM

## 2011-05-31 LAB — CBC
Hemoglobin: 9.3 g/dL — ABNORMAL LOW (ref 12.0–15.0)
Platelets: 184 10*3/uL (ref 150–400)
RBC: 3.41 MIL/uL — ABNORMAL LOW (ref 3.87–5.11)
WBC: 9.8 10*3/uL (ref 4.0–10.5)

## 2011-05-31 LAB — GLUCOSE, CAPILLARY
Glucose-Capillary: 132 mg/dL — ABNORMAL HIGH (ref 70–99)
Glucose-Capillary: 78 mg/dL (ref 70–99)
Glucose-Capillary: 88 mg/dL (ref 70–99)

## 2011-05-31 MED ORDER — DEXTROSE 50 % IV SOLN
INTRAVENOUS | Status: AC
  Administered 2011-05-31: 12.5 g
  Filled 2011-05-31: qty 50

## 2011-05-31 MED ORDER — HEPARIN SODIUM (PORCINE) 1000 UNIT/ML DIALYSIS
20.0000 [IU]/kg | INTRAMUSCULAR | Status: DC | PRN
  Administered 2011-06-01: 1000 [IU] via INTRAVENOUS_CENTRAL
  Filled 2011-05-31: qty 1

## 2011-05-31 NOTE — Progress Notes (Signed)
MD/N of pt decrease in BP 80-90 sys, lethargic status, and low CBGs, no new orders

## 2011-05-31 NOTE — Progress Notes (Signed)
CBG: 70  Treatment: 12.5g of D50 according to protocol   Symptoms: none noted  Follow-up CBG: Time:0435 CBG Result: 136  Possible Reasons for Event: Pt NPO   Comments/MD notified: followed protocol.    MACK, BRIAN P

## 2011-05-31 NOTE — Progress Notes (Signed)
2 Days Post-Op  Subjective: No complaints.  She feels comfortable.  No nausea.  No flatus  Objective: Vital signs in last 24 hours: Temp:  [98 F (36.7 C)-101.1 F (38.4 C)] 98 F (36.7 C) (03/15 0723) Pulse Rate:  [109-136] 109  (03/15 0723) Resp:  [5-19] 18  (03/15 0800) BP: (88-106)/(54-75) 106/75 mmHg (03/15 0723) SpO2:  [89 %-100 %] 93 % (03/15 0800) Weight:  [111 lb 5.3 oz (50.5 kg)] 111 lb 5.3 oz (50.5 kg) (03/14 1236)    Intake/Output from previous day: 03/14 0701 - 03/15 0700 In: 1325 [I.V.:1075; IV Piggyback:250] Out: -88  Intake/Output this shift:    General appearance: alert and no distress Resp: clear to auscultation bilaterally abdomen soft, non-distended, rare BS  Lab Results:   Basename 05/31/11 0430 05/30/11 1811 05/30/11 0950  WBC 9.8 -- 9.7  HGB 9.3* 9.1* --  HCT 29.5* 28.4* --  PLT 184 -- 231   BMET  Basename 05/30/11 0951 05/30/11 0737  NA 137 140  K 5.1 5.4*  CL 105 106  CO2 24 24  GLUCOSE 95 93  BUN 30* 30*  CREATININE 6.84* 6.95*  CALCIUM 8.5 8.8   PT/INR No results found for this basename: LABPROT:2,INR:2 in the last 72 hours ABG No results found for this basename: PHART:2,PCO2:2,PO2:2,HCO3:2 in the last 72 hours  Studies/Results: No results found.  Anti-infectives: Anti-infectives     Start     Dose/Rate Route Frequency Ordered Stop   05/30/11 0800   ertapenem (INVANZ) 1 g in sodium chloride 0.9 % 50 mL IVPB        1 g 100 mL/hr over 30 Minutes Intravenous Every 24 hours 05/29/11 1904 05/30/11 1506   05/28/11 1230   ertapenem (INVANZ) 1 g in sodium chloride 0.9 % 50 mL IVPB        1 g 100 mL/hr over 30 Minutes Intravenous 60 min pre-op 05/28/11 1223 05/29/11 0835          Assessment/Plan: s/p Procedure(s) (LRB): ILEOSTOMY TAKEDOWN (N/A)  Start sips of liquids PT consult Keep in step down until tomorrow  LOS: 2 days    [name] A 05/31/2011

## 2011-05-31 NOTE — Progress Notes (Signed)
PA Student Daily Progress Note [facility] Subjective: Pt states she is doing well. She denies any pain from her surgical site. She denies any N/V. She denies any CP or SOB.  Objective:  Filed Vitals:   05/31/11 0400 05/31/11 0723 05/31/11 0800 05/31/11 1137  BP: 93/54 106/75  88/53  Pulse: 115 109  106  Temp: 99.4 F (37.4 C) 98 F (36.7 C)    TempSrc: Oral Oral    Resp: 15 19 18 20   Height:      Weight:      SpO2: 89% 92% 93% 96%   Physical Exam: General- Pt is alert and oriented. She does not appear to be in any acute distress.  Head: Normocephalic and atraumatic.  Eyes: Conjunctivae are normal. Pupils are equal, round, and reactive to light.  Neck: Neck supple. No tracheal deviation present. No thyromegaly present.  Cardiovascular: Tachycardic, regular rhythm, grade 3/6 systolic murmur noted most prominent at RUSB  Pulmonary- Few bibasilar rales notes, no wheezes or rhonchi  Abdominal: Abdomen is bandaged. There is no evidence of bleeding through the bandage. BS are present but hypoactive Neurological: She is alert and oriented to person, place, and time. She is moving all extremities without difficulty. She is responding appropriately.  Skin: Skin is warm and dry. No rash noted. No erythema.  Extremities: Peripheral pulses 1+ and equal in all extremities. No cyanosis, clubbing, or edema noted.  Access: RUA graft  Assessment/Plan: 1. S/P ileostomy takedown- The patient underwent an ileostomy takedown on 3-13. The surgery was performed without complications. She appears to be recovering well. She denies any pain from her surgical site. No evidence of bleeding through her bandage. Bowel sounds are present but hypoactive. She has been transitioned to a clear liquid diet.  2. Hypotension- Pt is still hypotensive with last BP 88/53. She is still tachycardic but this appears to be a normal finding for her. She has prn orders for fluid bolus if symptomatic hypotension  occurs or SBP < 90.   Follow closely.  3. ESRD- The patient is on hemodialysis Tues, Thurs, Sat schedule. HD tomorrow, remove only 1 kg with soft BP's. Check labs with HD tomorrow. 4. Anemia-  HGB 9.3 today. Continue to follow. Consider repeating CBC in AM. She is receiving darbepoetin.  5. Hyperkalemia- 5.1 on 3-14, consider repeat BMP in AM 6. Secondary Hypoparathyroidism- s/p parathyroidectomy, no Vit D needed  7. Hepatitis C-  8. Aortic Stenosis-  9. Seizures- Currently not on any anti-sezure meds as an outpatient 10. Hypoalbuminemia- Albumin 2.4 on 3-14, on a clear liquid diet.   Labs: Basic Metabolic Panel:  Lab [id number] 0951 05/30/11 0737 05/29/11 1330  NA 137 140 140  K 5.1 5.4* 4.5  CL 105 106 100  CO2 24 24 25   GLUCOSE 95 93 86  BUN 30* 30* 22  CREATININE 6.84* 6.95* 5.59*  CALCIUM 8.5 8.8 9.8  ALB -- -- --  PHOS 4.4 -- --   Liver Function Tests:  Lab 05/30/11 0951  AST --  ALT --  ALKPHOS --  BILITOT --  PROT --  ALBUMIN 2.4*   No results found for this basename: LIPASE:3,AMYLASE:3 in the last 168 hours No results found for this basename: AMMONIA:3 in the last 168 hours INR: @resultsinr3 @ CBC:  Lab 05/31/11 0430 05/30/11 1811 05/30/11 0950 05/30/11 0737 05/29/11 1330  WBC 9.8 -- 9.7 10.6* --  NEUTROABS -- -- -- -- --  HGB 9.3* 9.1* 9.1* -- --  HCT 29.5*  28.4* 28.8* -- --  MCV 86.5 -- 85.0 85.2 87.6  PLT 184 -- 231 259 --   Blood Culture    Component Value Date/Time   SDES BLOOD LEFT HAND 04/07/2011 1550   SPECREQUEST BOTTLES DRAWN AEROBIC AND ANAEROBIC 10CC 04/07/2011 1550   CULT NO GROWTH 5 DAYS 04/07/2011 1550   REPTSTATUS 04/14/2011 FINAL 04/07/2011 1550    Cardiac Enzymes: No results found for this basename: CKTOTAL:5,CKMB:5,CKMBINDEX:5,TROPONINI:5 in the last 168 hours CBG:  Lab 05/31/11 0724 05/31/11 0515 05/31/11 0420 05/30/11 2347 05/30/11 1952  GLUCAP 78 132* 70 83 74   Iron Studies: No results found for this basename:  IRON,TIBC,TRANSFERRIN,FERRITIN in the last 72 hours  Micro Results: Recent Results (from the past 240 hour(s))  SURGICAL PCR SCREEN     Status: Normal   Collection Time   05/22/11 10:44 AM      Component Value Range Status Comment   MRSA, PCR NEGATIVE  NEGATIVE  Final    Staphylococcus aureus NEGATIVE  NEGATIVE  Final    Studies/Results: No results found. Medications: Scheduled Meds:   . sodium chloride  500 mL Intravenous Once  . darbepoetin  200 mcg Subcutaneous Q Wed-HD  . dextrose      . enoxaparin  30 mg Subcutaneous Q24H  . ertapenem (INVANZ) IV  1 g Intravenous Q24H  . gabapentin  300 mg Oral Daily  . morphine   Intravenous Q4H  . morphine      . multivitamin  1 tablet Oral QHS  . pantoprazole (PROTONIX) IV  40 mg Intravenous Q breakfast  . sodium chloride  250 mL Intravenous Once  . sodium chloride  250 mL Intravenous Once  . sodium chloride  500 mL Intravenous Once  . DISCONTD: metoprolol succinate  12.5 mg Oral Daily  . DISCONTD: multivitamin  1 tablet Oral Daily   Continuous Infusions:   . DISCONTD: sodium chloride 75 mL/hr at 05/29/11 2001   PRN Meds:.sodium chloride, sodium chloride, acetaminophen, acetaminophen, calcium carbonate (dosed in mg elemental calcium), camphor-menthol, dextrose, diphenhydrAMINE, diphenhydrAMINE, docusate sodium, feeding supplement (NEPRO CARB STEADY), heparin, hydrOXYzine, lidocaine, lidocaine-prilocaine, metoprolol, naloxone, ondansetron (ZOFRAN) IV, ondansetron (ZOFRAN) IV, ondansetron (ZOFRAN) IV, ondansetron, ondansetron, pentafluoroprop-tetrafluoroeth sodium chloride, sorbitol, zolpidem   This is a Careers information officer Note.  The care of the patient was discussed with Dr. Jonnie Finner and the assessment and plan formulated with their assistance.  Please see their attached note for official documentation of the daily encounter.  Gabriel Rainwater PA-S2 05/31/2011, 12:30 PM  Patient seen and examined and agree with assessment and plan as above  with additions in bold.   Kelly Splinter  MD [facility] [phone] pgr    [phone] cell 05/31/2011, 5:58 PM

## 2011-06-01 ENCOUNTER — Inpatient Hospital Stay (HOSPITAL_COMMUNITY): Payer: Medicare Other

## 2011-06-01 LAB — RENAL FUNCTION PANEL
Albumin: 2.4 g/dL — ABNORMAL LOW (ref 3.5–5.2)
Chloride: 100 mEq/L (ref 96–112)
GFR calc Af Amer: 6 mL/min — ABNORMAL LOW (ref >90)
GFR calc non Af Amer: 6 mL/min — ABNORMAL LOW (ref >90)
Potassium: 4.2 mEq/L (ref 3.5–5.1)
Sodium: 137 mEq/L (ref 135–145)

## 2011-06-01 LAB — GLUCOSE, CAPILLARY
Glucose-Capillary: 69 mg/dL — ABNORMAL LOW (ref 70–99)
Glucose-Capillary: 74 mg/dL (ref 70–99)
Glucose-Capillary: 74 mg/dL (ref 70–99)
Glucose-Capillary: 76 mg/dL (ref 70–99)
Glucose-Capillary: 80 mg/dL (ref 70–99)
Glucose-Capillary: 82 mg/dL (ref 70–99)
Glucose-Capillary: 87 mg/dL (ref 70–99)

## 2011-06-01 LAB — CBC
HCT: 25.4 % — ABNORMAL LOW (ref 36.0–46.0)
Platelets: 254 10*3/uL (ref 150–400)
RDW: 16.1 % — ABNORMAL HIGH (ref 11.5–15.5)
WBC: 9.4 10*3/uL (ref 4.0–10.5)

## 2011-06-01 MED ORDER — MORPHINE SULFATE (PF) 1 MG/ML IV SOLN
INTRAVENOUS | Status: AC
  Filled 2011-06-01: qty 25

## 2011-06-01 NOTE — Progress Notes (Signed)
[facility] Subjective: Has not had BM or flatus.  Taking liquids only.  Hasn't been OOB, uses walker at home, unsteady without. No new complaints.  Objective:  Filed Vitals:   05/31/11 2000 06/01/11 0000 06/01/11 0400 06/01/11 0800  BP: 91/53 97/57 97/60  103/60  Pulse: 104 104 103 102  Temp: 99.5 F (37.5 C) 99.4 F (37.4 C) 98.9 F (37.2 C) 100.4 F (38 C)  TempSrc: Oral Oral Oral Oral  Resp: 16 8 10 16   Height:      Weight:      SpO2: 95% 93% 90% 93%   Physical Exam: General- alert, no distress Neck: Neck supple. No tracheal deviation present. No thyromegaly present.  Cardiovascular: Tachycardic, regular rhythm, grade 3/6 systolic murmur noted most prominent at RUSB  Pulmonary- Few bibasilar rales notes, no wheezes or rhonchi  Abdominal: Abdomen is bandaged. There is no evidence of bleeding through the bandage. BS are present but hypoactive Neurological: She is responding appropriately.  Skin: Skin is warm and dry. No rash noted. No erythema.  Extremities: Peripheral pulses 1+ and equal in all extremities. No cyanosis, clubbing, or edema noted.  Access: RUA graft  Assessment/Plan: 1. S/P ileostomy takedown 3/13- doing well, on clears, no BM yet.   2. Hypotension- blood pressures lowish, not symptomatic, prob volume related, no signs of sepsis clinically.  3. ESRD / TTS- dialysis today, minimal UF 4. Anemia-  Hb 9's, EPO.  5. Secondary Hypoparathyroidism- s/p parathyroidectomy, no Vit D needed  6. Hepatitis C  7. Seizures- Currently not on any anti-sezure meds as an outpatient 8. Hypoalbuminemia- Albumin 2.4 on 3-14, on a clear liquid diet.  9. Chronic debility- PT consult, walker, OOB to chair with meals  Labs: Basic Metabolic Panel:  Lab [id number] 0951 05/30/11 0737 05/29/11 1330  NA 137 140 140  K 5.1 5.4* 4.5  CL 105 106 100  CO2 24 24 25   GLUCOSE 95 93 86  BUN 30* 30* 22  CREATININE 6.84* 6.95* 5.59*  CALCIUM 8.5 8.8 9.8  ALB -- -- --  PHOS  4.4 -- --   Liver Function Tests:  Lab 05/30/11 0951  AST --  ALT --  ALKPHOS --  BILITOT --  PROT --  ALBUMIN 2.4*   No results found for this basename: LIPASE:3,AMYLASE:3 in the last 168 hours No results found for this basename: AMMONIA:3 in the last 168 hours INR: @resultsinr3 @ CBC:  Lab 05/31/11 0430 05/30/11 1811 05/30/11 0950 05/30/11 0737 05/29/11 1330  WBC 9.8 -- 9.7 10.6* --  NEUTROABS -- -- -- -- --  HGB 9.3* 9.1* 9.1* -- --  HCT 29.5* 28.4* 28.8* -- --  MCV 86.5 -- 85.0 85.2 87.6  PLT 184 -- 231 259 --   Blood Culture    Component Value Date/Time   SDES BLOOD LEFT HAND 04/07/2011 1550   SPECREQUEST BOTTLES DRAWN AEROBIC AND ANAEROBIC 10CC 04/07/2011 1550   CULT NO GROWTH 5 DAYS 04/07/2011 1550   REPTSTATUS 04/14/2011 FINAL 04/07/2011 1550    Cardiac Enzymes: No results found for this basename: CKTOTAL:5,CKMB:5,CKMBINDEX:5,TROPONINI:5 in the last 168 hours CBG:  Lab 06/01/11 0722 06/01/11 0419 05/31/11 2355 05/31/11 2004 05/31/11 1601  GLUCAP 70 74 80 92 88   Iron Studies: No results found for this basename: IRON,TIBC,TRANSFERRIN,FERRITIN in the last 72 hours  Micro Results: Recent Results (from the past 240 hour(s))  SURGICAL PCR SCREEN     Status: Normal   Collection Time   05/22/11 10:44 AM  Component Value Range Status Comment   MRSA, PCR NEGATIVE  NEGATIVE  Final    Staphylococcus aureus NEGATIVE  NEGATIVE  Final    Studies/Results: No results found. Medications: Scheduled Meds:    . darbepoetin  200 mcg Subcutaneous Q Wed-HD  . enoxaparin  30 mg Subcutaneous Q24H  . gabapentin  300 mg Oral Daily  . morphine   Intravenous Q4H  . multivitamin  1 tablet Oral QHS  . pantoprazole (PROTONIX) IV  40 mg Intravenous Q breakfast  . sodium chloride  500 mL Intravenous Once   Continuous Infusions:  PRN Meds:.sodium chloride, sodium chloride, acetaminophen, acetaminophen, calcium carbonate (dosed in mg elemental calcium), camphor-menthol,  diphenhydrAMINE, diphenhydrAMINE, docusate sodium, feeding supplement (NEPRO CARB STEADY), heparin, heparin, hydrOXYzine, lidocaine, lidocaine-prilocaine, metoprolol, naloxone, ondansetron (ZOFRAN) IV, ondansetron (ZOFRAN) IV, ondansetron (ZOFRAN) IV, ondansetron, ondansetron, pentafluoroprop-tetrafluoroeth sodium chloride, sorbitol, zolpidem   This is a Careers information officer Note.  The care of the patient was discussed with Dr. Jonnie Finner and the assessment and plan formulated with their assistance.  Please see their attached note for official documentation of the daily encounter.  Gabriel Rainwater PA-S2 06/01/2011, 9:58 AM  Patient seen and examined and agree with assessment and plan as above with additions in bold.   Kelly Splinter  MD [facility] [phone] pgr    [phone] cell 06/01/2011, 9:58 AM

## 2011-06-01 NOTE — Progress Notes (Signed)
Pt tx to 6700 per MD order. Report given to nurse. Pts VSS. Pt up to chair, two person assist, tolerated well. All questions answered.

## 2011-06-01 NOTE — Progress Notes (Signed)
3 Days Post-Op  Subjective: No complaints.  Tolerating liquids.  No nausea or bloating.  Denies pain.  No flatus  Objective: Vital signs in last 24 hours: Temp:  [98.3 F (36.8 C)-100.4 F (38 C)] 100.4 F (38 C) (03/16 0800) Pulse Rate:  [100-106] 102  (03/16 0800) Resp:  [8-20] 16  (03/16 0800) BP: (88-103)/(53-60) 103/60 mmHg (03/16 0800) SpO2:  [88 %-96 %] 88 % (03/16 0950)    Intake/Output from previous day: 03/15 0701 - 03/16 0700 In: 360 [P.O.:360] Out: -  Intake/Output this shift:    Awake and alert Abdomen soft, non distended  Lab Results:   Basename 05/31/11 0430 05/30/11 1811 05/30/11 0950  WBC 9.8 -- 9.7  HGB 9.3* 9.1* --  HCT 29.5* 28.4* --  PLT 184 -- 231   BMET  Basename 05/30/11 0951 05/30/11 0737  NA 137 140  K 5.1 5.4*  CL 105 106  CO2 24 24  GLUCOSE 95 93  BUN 30* 30*  CREATININE 6.84* 6.95*  CALCIUM 8.5 8.8   PT/INR No results found for this basename: LABPROT:2,INR:2 in the last 72 hours ABG No results found for this basename: PHART:2,PCO2:2,PO2:2,HCO3:2 in the last 72 hours  Studies/Results: No results found.  Anti-infectives: Anti-infectives     Start     Dose/Rate Route Frequency Ordered Stop   05/30/11 0800   ertapenem (INVANZ) 1 g in sodium chloride 0.9 % 50 mL IVPB        1 g 100 mL/hr over 30 Minutes Intravenous Every 24 hours 05/29/11 1904 05/30/11 1506   05/28/11 1230   ertapenem (INVANZ) 1 g in sodium chloride 0.9 % 50 mL IVPB        1 g 100 mL/hr over 30 Minutes Intravenous 60 min pre-op 05/28/11 1223 05/29/11 0835          Assessment/Plan: s/p Procedure(s) (LRB): ILEOSTOMY TAKEDOWN (N/A)  Transfer to floor Keep on clear liquids   LOS: 3 days    Makena Murdock A 06/01/2011

## 2011-06-01 NOTE — Progress Notes (Signed)
O2 sats were monitored entire tx d/t being on PCA pump, but I did not document them on the tx f/s since pt was not in here for shortness of breath or breathing difficuluties until primary nurse asked if we had monitored them and end tidal volumes--> I was not aware nor had I had the training to monitor end tidal volumes on pt's w/ pca pumps not training on the modules the primary nurse was asking me about--> after primary nurse called, i went back and documented the o2 sat data i had written on scrap paper and documented them on the HD tx f/s

## 2011-06-02 LAB — BASIC METABOLIC PANEL
Calcium: 8.7 mg/dL (ref 8.4–10.5)
Chloride: 100 mEq/L (ref 96–112)
Creatinine, Ser: 3.63 mg/dL — ABNORMAL HIGH (ref 0.50–1.10)
GFR calc Af Amer: 15 mL/min — ABNORMAL LOW (ref >90)
GFR calc non Af Amer: 13 mL/min — ABNORMAL LOW (ref >90)

## 2011-06-02 LAB — GLUCOSE, CAPILLARY
Glucose-Capillary: 77 mg/dL (ref 70–99)
Glucose-Capillary: 86 mg/dL (ref 70–99)

## 2011-06-02 LAB — CBC
MCHC: 31.3 g/dL (ref 30.0–36.0)
MCV: 85.3 fL (ref 78.0–100.0)
Platelets: 251 10*3/uL (ref 150–400)
RDW: 16.1 % — ABNORMAL HIGH (ref 11.5–15.5)
WBC: 8.8 10*3/uL (ref 4.0–10.5)

## 2011-06-02 MED ORDER — HYDROCODONE-ACETAMINOPHEN 5-325 MG PO TABS
1.0000 | ORAL_TABLET | ORAL | Status: DC | PRN
  Administered 2011-06-02 (×2): 1 via ORAL
  Administered 2011-06-03 – 2011-06-05 (×5): 2 via ORAL
  Filled 2011-06-02 (×2): qty 2
  Filled 2011-06-02: qty 1
  Filled 2011-06-02 (×3): qty 2
  Filled 2011-06-02: qty 1

## 2011-06-02 NOTE — Progress Notes (Signed)
Subjective: +BM x 2, got up in chair with assist Objective:  Filed Vitals:   06/02/11 0534 06/02/11 0800 06/02/11 0900 06/02/11 1413  BP: 94/57  98/58 111/56  Pulse: 100  91 102  Temp: 99.2 F (37.3 C)  99.5 F (37.5 C) 98.7 F (37.1 C)  TempSrc: Oral  Oral Oral  Resp: 14 9 15 16   Height:      Weight:      SpO2: 99% 100% 100% 100%   Physical Exam: General- alert, no distress Neck: Neck supple. No tracheal deviation present. No thyromegaly present.  Cardiovascular: Tachycardic, regular rhythm, grade 3/6 systolic murmur noted most prominent at RUSB  Pulmonary- Few bibasilar rales notes, no wheezes or rhonchi  Abdominal: Abdomen is bandaged. There is no evidence of bleeding through the bandage. BS are present but hypoactive Neurological: She is responding appropriately.  Skin: Skin is warm and dry. No rash noted. No erythema.  Extremities: Peripheral pulses 1+ and equal in all extremities. No cyanosis, clubbing, or edema noted.  Access: RUA graft  Assessment/Plan: 1. S/P ileostomy takedown 3/13- doing well, + BM x 2. Per surgery.   2. Hypotension- blood pressures lowish, not symptomatic, prob volume related, no signs of sepsis clinically.  3. ESRD / TTS- dialysis yesterday, minimal UF 4. Anemia-  Hb 9's, EPO.  5. Secondary Hypoparathyroidism- s/p parathyroidectomy, no Vit D needed  6. Hepatitis C  7. Seizures- Currently not on any anti-sezure meds as an outpatient 8. Hypoalbuminemia- Albumin 2.4 on 3-14, on a clear liquid diet.  9. Chronic debility- PT consult, walker, OOB to chair with meals, needs assist to get up.  Labs: Basic Metabolic Panel:  Lab [id number] 06/01/11 1919 05/30/11 0951  NA 138 137 137  K 3.5 4.2 5.1  CL 100 100 105  CO2 29 25 24   GLUCOSE 63* 82 95  BUN 10 26* 30*  CREATININE 3.63* 7.09* 6.84*  CALCIUM 8.7 8.9 8.5  ALB -- -- --  PHOS -- 4.0 4.4   Liver Function Tests:  Lab 06/01/11 1919 05/30/11 0951  AST -- --  ALT -- --  ALKPHOS -- --    BILITOT -- --  PROT -- --  ALBUMIN 2.4* 2.4*   No results found for this basename: LIPASE:3,AMYLASE:3 in the last 168 hours No results found for this basename: AMMONIA:3 in the last 168 hours INR: @resultsinr3 @ CBC:  Lab 06/02/11 0645 06/01/11 1919 05/31/11 0430 05/30/11 0950 05/30/11 0737  WBC 8.8 9.4 9.8 -- --  NEUTROABS -- -- -- -- --  HGB 8.0* 8.0* 9.3* -- --  HCT 25.6* 25.4* 29.5* -- --  MCV 85.3 85.2 86.5 85.0 85.2  PLT 251 254 184 -- --   Blood Culture    Component Value Date/Time   SDES BLOOD LEFT HAND 04/07/2011 1550   SPECREQUEST BOTTLES DRAWN AEROBIC AND ANAEROBIC 10CC 04/07/2011 1550   CULT NO GROWTH 5 DAYS 04/07/2011 1550   REPTSTATUS 04/14/2011 FINAL 04/07/2011 1550    Cardiac Enzymes: No results found for this basename: CKTOTAL:5,CKMB:5,CKMBINDEX:5,TROPONINI:5 in the last 168 hours CBG:  Lab 06/02/11 0830 06/02/11 0755 06/01/11 2328 06/01/11 1832 06/01/11 1745  GLUCAP 95 64* 76 74 62*   Iron Studies: No results found for this basename: IRON,TIBC,TRANSFERRIN,FERRITIN in the last 72 hours  Micro Results: No results found for this or any previous visit (from the past 240 hour(s)). Studies/Results: No results found. Medications: Scheduled Meds:    . darbepoetin  200 mcg Subcutaneous Q Wed-HD  . enoxaparin  30 mg Subcutaneous Q24H  . gabapentin  300 mg Oral Daily  . multivitamin  1 tablet Oral QHS  . pantoprazole (PROTONIX) IV  40 mg Intravenous Q breakfast  . sodium chloride  500 mL Intravenous Once  . DISCONTD: morphine   Intravenous Q4H   Continuous Infusions:  PRN Meds:.sodium chloride, sodium chloride, acetaminophen, acetaminophen, calcium carbonate (dosed in mg elemental calcium), camphor-menthol, docusate sodium, feeding supplement (NEPRO CARB STEADY), heparin, HYDROcodone-acetaminophen, hydrOXYzine, lidocaine, lidocaine-prilocaine, metoprolol, ondansetron (ZOFRAN) IV, ondansetron (ZOFRAN) IV, ondansetron, ondansetron,  pentafluoroprop-tetrafluoroeth, sorbitol, zolpidem, DISCONTD: diphenhydrAMINE DISCONTD: diphenhydrAMINE, DISCONTD: naloxone, DISCONTD: ondansetron (ZOFRAN) IV, DISCONTD: sodium chloride   Kelly Splinter  MD [place] [phone]    [phone] 06/02/2011, 2:43 PM

## 2011-06-02 NOTE — Evaluation (Signed)
Physical Therapy Evaluation Patient Details Name: [name] MRN: [id number] DOB: June 12, 1950 Today's Date: 06/02/2011  Problem List:  Patient Active Problem List  Diagnoses  . Lumbar stenosis with neurogenic claudication  . Spondylolisthesis of lumbar region  . End stage renal disease on dialysis  . Hypertension  . Unspecified viral hepatitis C without hepatic coma  . Radiculopathy  . Anemia of chronic kidney failure  . Acute ischemic colitis  . Radiculopathy of lumbar region  . Altered mental status  . Sinus tachycardia  . Ileostomy in place  . Tachycardia  . Aortic stenosis  . Preop cardiovascular exam    Past Medical History:  Past Medical History  Diagnosis Date  . Seizures     r/t HTN in 1990's x 1  . Brain aneurysm     No records could be found  . Blood transfusion 1990's    r/t Kidney removal surgery  . Arthritis     Back  . Anemia     anemia of chronic disease likely 2/2 ESRD per last anemia panel (02/2011) with Fe 35, TIBC 167, ferritin 2041  // BL Hgb 8-10  . Umbilical hernia age 52  . Heart murmur     Born with heart mumur, does not require follow up per pt  . Hypertension     Does not see a heart doctor, had pre transplant stress test at [facility]    . End stage renal disease on dialysis     Secondary to hypertension // T/Th/Sat dialysis on [employer]  . Hepatitis C   . Colitis, ischemic 01/2011    S/P partial colectomy of right hemicolon and ileostomy placement  . Lumbar spinal stenosis      bilateral L4-5 and L5-S1 posterior lumbar interbody arthrodesis and bilateral L4-S1 posterior lateral arthrodesis for lumbar stenosis dynamic lumbar spondylolisthesis lumbar spondylosis lumbar radiculopathy with foot drop // s/p surgery 01/2011 - see surgery section for details.  Marland Kitchen Dysrhythmia     SVT for brief period in Feb 2013  . Dialysis patient     [place] [facility]  . Diabetes mellitus     Borderline  . Foot drop, bilateral       after back surgery  . Anuria     Due to dialysis  . Aortic stenosis, mild     03/2011 echo   Past Surgical History:  Past Surgical History  Procedure Date  . Nephrectomy 2010    right side done at [facility] on transplant list  . Umbilical hernia repair age 78- 5  . Appendectomy 1960's  . Vascular surgery     right arm dialysis graft  . Tonsillectomy     as a child  . Rib resection     d/t kidney removal  . Back surgery 01/30/2011    L4-S1 lumbar laminectomy facetectomy and foraminotomies for decompression a bilateral L4-5 and L5-S1 posterior lumbar interbody arthrodesis and bilateral L4-S1 posterior lateral arthrodesis for lumbar stenosis dynamic lumbar spondylolisthesis lumbar spondylosis lumbar radiculopathy with foot drop  . Laparotomy 02/09/2011    Procedure: EXPLORATORY LAPAROTOMY;  Surgeon: Harl Bowie, MD;  Location: [place];  Service: General;  Laterality: N/A;  . Partial colectomy 02/09/2011    Reason for surgery: ischemic colitis of right hemicolon; Surgeon: Harl Bowie, MD;  Location: [place];  Service: General;  Laterality: Right;  . Colostomy 02/09/2011    Reason for surgery: ischemic colitis of right hemicolon requiring partial colectomy and ileostomy.  Surgeon: Marco Collie  Ninfa Linden, MD;  Location: [place];  Service: General;  Laterality: Right;  . Colonoscopy 02/19/2011    Procedure: COLONOSCOPY;  Surgeon: Missy Sabins, MD;  Location: [place];  Service: Endoscopy;  Laterality: N/A;  . Colonoscopy 03/07/2011    Procedure: COLONOSCOPY;  Surgeon: Cleotis Nipper, MD;  Location: [facility];  Service: Endoscopy;  Laterality: N/A;  no prep needed--pt has long Hartmann's pouch and ileostomy  . Total parathyroidectomy 01/17/2001    with autotransplantation into left forearm // due to secondary hyperparathyroidism from ESRD  . Av fistula placement 04/08/2011    Procedure: INSERTION OF ARTERIOVENOUS (AV) GORE-TEX GRAFT ARM;  Surgeon: Rosetta Posner, MD;  [facility];  Service: Vascular;  Laterality: Right;  Revision of Upper Arm Gore-Tex Graft    PT Assessment/Plan/Recommendation PT Assessment Clinical Impression Statement: Pt admitted for ileostomy reversal.  PTA pt was receiving HHPT.  Acute care PT indicated to increase strength, progress mobility/gait, and provide education for safe d/c home with daughter. PT Recommendation/Assessment: Patient will need skilled PT in the acute care venue PT Problem List: Decreased strength;Decreased activity tolerance;Decreased mobility;Pain Barriers to Discharge: None PT Therapy Diagnosis : Difficulty walking;Generalized weakness;Acute pain PT Plan PT Frequency: Min 3X/week PT Treatment/Interventions: DME instruction;Gait training;Functional mobility training;Therapeutic activities;Therapeutic exercise;Patient/family education PT Recommendation Recommendations for Other Services: OT consult Follow Up Recommendations: Home health PT Equipment Recommended: None recommended by PT PT Goals  Acute Rehab PT Goals PT Goal Formulation: With patient Time For Goal Achievement: 2 weeks Pt will go Supine/Side to Sit: with min assist PT Goal: Supine/Side to Sit - Progress: Goal set today Pt will go Sit to Supine/Side: with min assist PT Goal: Sit to Supine/Side - Progress: Goal set today Pt will go Sit to Stand: with min assist PT Goal: Sit to Stand - Progress: Goal set today Pt will go Stand to Sit: with min assist PT Goal: Stand to Sit - Progress: Goal set today Pt will Transfer Bed to Chair/Chair to Bed: with min assist PT Transfer Goal: Bed to Chair/Chair to Bed - Progress: Goal set today Pt will Ambulate: 1 - 15 feet;with min assist;with rolling walker PT Goal: Ambulate - Progress: Goal set today  PT Evaluation Precautions/Restrictions    Prior Functioning  Home Living Lives With: Daughter Receives Help From: Family Type of Home: House Home Layout: One level Home Access: Ramped entrance Home  Adaptive Equipment: Walker - rolling;Wheelchair - manual Prior Function Level of Independence: Requires assistive device for independence;Independent with transfers;Independent with gait;Needs assistance with ADLs;Needs assistance with homemaking Driving: No Cognition Cognition Arousal/Alertness: Awake/alert Overall Cognitive Status: Appears within functional limits for tasks assessed Orientation Level: Oriented X4 Sensation/Coordination   Extremity Assessment RLE Assessment RLE Assessment: Exceptions to [facility] RLE Strength Right Ankle Dorsiflexion: 0/5 LLE Assessment LLE Assessment: Exceptions to [facility] LLE Strength Left Ankle Dorsiflexion: 1/5 Mobility (including Balance) Bed Mobility Bed Mobility: Yes Supine to Sit: 3: Mod assist;HOB elevated (Comment degrees);With rails (20 degrees) Supine to Sit Details (indicate cue type and reason): verbal/tactile cues for sequencing Transfers Transfers: Yes Sit to Stand: 3: Mod assist Sit to Stand Details (indicate cue type and reason): verbal cues for hand placement, tactile cues for forward weight shift Stand to Sit: 3: Mod assist Stand to Sit Details: assist to control descent, verbal cues for sequencing/safety Stand Pivot Transfers: 3: Mod assist (with RW) Stand Pivot Transfer Details (indicate cue type and reason): verbal cues for sequencing    Exercise    End of Session  PT - End of Session Equipment Utilized During Treatment: Gait belt Activity Tolerance: Patient limited by fatigue;Patient limited by pain Patient left: in chair;with call bell in reach General Behavior During Session: [facility] for tasks performed Cognition: [facility] for tasks performed  Lorriane Shire 06/02/2011, 2:26 PM  Lorrin Goodell, PT  Office # [phone] Pager [phone]

## 2011-06-02 NOTE — Progress Notes (Signed)
Patient ID: [name], female   DOB: February 18, 1951, 61 y.o.   MRN: [id number] 4 Days Post-Op  Subjective: No C/O.  Has had two BMs  Objective: Vital signs in last 24 hours: Temp:  [98.6 F (37 C)-100.4 F (38 C)] 99.2 F (37.3 C) (03/17 0534) Pulse Rate:  [98-129] 100  (03/17 0534) Resp:  [8-22] 14  (03/17 0534) BP: (94-138)/(54-87) 94/57 mmHg (03/17 0534) SpO2:  [86 %-100 %] 99 % (03/17 0534) Weight:  [115 lb 15.4 oz (52.6 kg)-117 lb 8.1 oz (53.3 kg)] 117 lb 8.1 oz (53.3 kg) (03/16 2224)    Intake/Output from previous day: 03/16 0701 - 03/17 0700 In: 478 [P.O.:410; I.V.:68] Out: -205 [Stool:1] Intake/Output this shift:    General appearance: alert and no distress GI: normal findings: soft, non-tender Incision/Wound: Dressing clean dry and intact  Lab Results:   Basename 06/01/11 1919 05/31/11 0430  WBC 9.4 9.8  HGB 8.0* 9.3*  HCT 25.4* 29.5*  PLT 254 184   BMET  Basename 06/01/11 1919 05/30/11 0951  NA 137 137  K 4.2 5.1  CL 100 105  CO2 25 24  GLUCOSE 82 95  BUN 26* 30*  CREATININE 7.09* 6.84*  CALCIUM 8.9 8.5     Studies/Results: No results found.  Anti-infectives: Anti-infectives     Start     Dose/Rate Route Frequency Ordered Stop   05/30/11 0800   ertapenem (INVANZ) 1 g in sodium chloride 0.9 % 50 mL IVPB        1 g 100 mL/hr over 30 Minutes Intravenous Every 24 hours 05/29/11 1904 05/30/11 1506   05/28/11 1230   ertapenem (INVANZ) 1 g in sodium chloride 0.9 % 50 mL IVPB        1 g 100 mL/hr over 30 Minutes Intravenous 60 min pre-op 05/28/11 1223 05/29/11 0835          Assessment/Plan: s/p Procedure(s): ILEOSTOMY TAKEDOWN Post op anemia.  Generally doing well Advance diet CBC in AM   LOS: 4 days    Santez Woodcox T 06/02/2011

## 2011-06-03 ENCOUNTER — Encounter (HOSPITAL_COMMUNITY): Payer: Self-pay | Admitting: Surgery

## 2011-06-03 LAB — RENAL FUNCTION PANEL
BUN: 16 mg/dL (ref 6–23)
CO2: 30 mEq/L (ref 19–32)
Calcium: 9 mg/dL (ref 8.4–10.5)
Glucose, Bld: 79 mg/dL (ref 70–99)
Phosphorus: 3.3 mg/dL (ref 2.3–4.6)

## 2011-06-03 LAB — CBC
HCT: 26.6 % — ABNORMAL LOW (ref 36.0–46.0)
Hemoglobin: 8.4 g/dL — ABNORMAL LOW (ref 12.0–15.0)
MCH: 27.4 pg (ref 26.0–34.0)
MCHC: 31.6 g/dL (ref 30.0–36.0)
MCV: 86.6 fL (ref 78.0–100.0)

## 2011-06-03 LAB — GLUCOSE, CAPILLARY: Glucose-Capillary: 112 mg/dL — ABNORMAL HIGH (ref 70–99)

## 2011-06-03 MED ORDER — PANTOPRAZOLE SODIUM 40 MG PO TBEC
40.0000 mg | DELAYED_RELEASE_TABLET | Freq: Every day | ORAL | Status: DC
  Administered 2011-06-04: 40 mg via ORAL
  Filled 2011-06-03: qty 1

## 2011-06-03 NOTE — Progress Notes (Signed)
The patient is receiving Protonix by the intravenous route.  Based on criteria approved by the Pharmacy and [place], the medication is being converted to the equivalent oral dose form.  These criteria include: -No Active GI bleeding -Able to tolerate diet of full liquids (or better) or tube feeding -Able to tolerate other medications by the oral or enteral route  If you have any questions about this conversion, please contact the Pharmacy Department (ext [phone]).  Thank you.  Francesca Jewett, PharmD Pager: [phone]  06/03/2011 12:42 PM

## 2011-06-03 NOTE — Progress Notes (Signed)
Subjective:  Feeling fine this am; generalized weakness; otherwise, no complaints  Vital signs in last 24 hours: Filed Vitals:   06/02/11 1413 06/02/11 1711 06/02/11 2148 06/03/11 0514  BP: 111/56 92/48 110/65 116/66  Pulse: 102 98 96 92  Temp: 98.7 F (37.1 C) 98.3 F (36.8 C) 99.1 F (37.3 C) 98.6 F (37 C)  TempSrc: Oral Oral Oral Oral  Resp: 16 17 19 20   Height:      Weight:      SpO2: 100% 97% 96% 65%   Weight change:   Intake/Output Summary (Last 24 hours) at 06/03/11 0818 Last data filed at 06/02/11 2146  Gross per 24 hour  Intake    410 ml  Output      0 ml  Net    410 ml   Labs: Basic Metabolic Panel:  Lab 99991111 0500 06/02/11 0645 06/01/11 1919 05/30/11 0951  NA 141 138 137 --  K 3.5 3.5 4.2 --  CL 102 100 100 --  CO2 30 29 25  --  GLUCOSE 79 63* 82 --  BUN 16 10 26* --  CREATININE 5.24* 3.63* 7.09* --  CALCIUM 9.0 8.7 8.9 --  ALB -- -- -- --  PHOS 3.3 -- 4.0 4.4   Liver Function Tests:  Lab 06/03/11 0500 06/01/11 1919 05/30/11 0951  AST -- -- --  ALT -- -- --  ALKPHOS -- -- --  BILITOT -- -- --  PROT -- -- --  ALBUMIN 2.3* 2.4* 2.4*   No results found for this basename: LIPASE:3,AMYLASE:3 in the last 168 hours No results found for this basename: AMMONIA:3 in the last 168 hours CBC:  Lab 06/03/11 0500 06/02/11 0645 06/01/11 1919 05/31/11 0430 05/30/11 0950  WBC 7.5 8.8 9.4 -- --  NEUTROABS -- -- -- -- --  HGB 8.4* 8.0* 8.0* -- --  HCT 26.6* 25.6* 25.4* -- --  MCV 86.6 85.3 85.2 86.5 85.0  PLT 301 251 254 -- --   Cardiac Enzymes: No results found for this basename: CKTOTAL:5,CKMB:5,CKMBINDEX:5,TROPONINI:5 in the last 168 hours CBG:  Lab 06/03/11 0748 06/02/11 2133 06/02/11 1713 06/02/11 1155 06/02/11 0830  GLUCAP 68* 72 86 77 95    Iron Studies: No results found for this basename: IRON,TIBC,TRANSFERRIN,FERRITIN in the last 72 hours Studies/Results: No results found. Medications:      . darbepoetin  200 mcg Subcutaneous Q Wed-HD    . enoxaparin  30 mg Subcutaneous Q24H  . gabapentin  300 mg Oral Daily  . multivitamin  1 tablet Oral QHS  . pantoprazole (PROTONIX) IV  40 mg Intravenous Q breakfast  . sodium chloride  500 mL Intravenous Once    I  have reviewed scheduled and prn medications.  Physical Exam: General- pleasant; comfortable, no distress  Cardiovascular: RRR, 3/6 SEM; RUSB  Pulmonary- CTAB Abdominal: bulky midline abd dressing, +BS  Extremities: no ankle edema  Vascular Access: RUA graft, +T/B   Assessment/Plan:  1. S/P ileostomy takedown 3/13- reports having BM's; no N/V, abd pain this am; surgery following  2. Hypotension/Volume- BP soft;  No BP meds; No volume issues; history ST and has prn BB for rate control;  minimal UF with HD for control 3. ESRD- TTS [place], next tx tomorrow 4. Hypokalemia- replete with HD 5. Anemia- Hb ^ 8.4, Max EPO.; follow trend; transfuse <7.5  6. Secondary Hypoparathyroidism- phos 3.3, no binders; s/p parathyroidectomy, no Vit D  7. Hepatitis C  8. Seizures- no anti-seizure meds or activity 9. Nutrition/Hypoalbuminemia- Albumin 2.3;  previously on clear liq; diet advanced ^ protein yesterday; encourage ^protein supple (Nepro)  10. DM- CBG and SSI per primary; bs 68-86; appetite poor 11. Disposition/Chronic debility- per primary services; PT consult, walker, OOB to chair with meals, needs assist to get up.   Marni Griffon, FNP-C [facility] Pager [phone]  06/03/2011,8:18 AM  LOS: 5 days

## 2011-06-03 NOTE — Progress Notes (Signed)
I have seen and examined this patient and agree with the plan of care plan dialysis in AM.  [facility] W 06/03/2011, 11:44 AM

## 2011-06-03 NOTE — Progress Notes (Signed)
5 Days Post-Op  Subjective: No complaints.  Tolerating po.  +BM  Objective: Vital signs in last 24 hours: Temp:  [98.3 F (36.8 C)-99.5 F (37.5 C)] 98.6 F (37 C) (03/18 0514) Pulse Rate:  [91-102] 92  (03/18 0514) Resp:  [15-20] 20  (03/18 0514) BP: (92-116)/(48-66) 116/66 mmHg (03/18 0514) SpO2:  [65 %-100 %] 65 % (03/18 0514) Last BM Date: 06/02/11  Intake/Output from previous day: 03/17 0701 - 03/18 0700 In: 410 [P.O.:410] Out: -  Intake/Output this shift:    Abdomen soft.  Wicks removed.  Incision clean  Lab Results:   Basename 06/03/11 0500 06/02/11 0645  WBC 7.5 8.8  HGB 8.4* 8.0*  HCT 26.6* 25.6*  PLT 301 251   BMET  Basename 06/03/11 0500 06/02/11 0645  NA 141 138  K 3.5 3.5  CL 102 100  CO2 30 29  GLUCOSE 79 63*  BUN 16 10  CREATININE 5.24* 3.63*  CALCIUM 9.0 8.7   PT/INR No results found for this basename: LABPROT:2,INR:2 in the last 72 hours ABG No results found for this basename: PHART:2,PCO2:2,PO2:2,HCO3:2 in the last 72 hours  Studies/Results: No results found.  Anti-infectives: Anti-infectives     Start     Dose/Rate Route Frequency Ordered Stop   05/30/11 0800   ertapenem (INVANZ) 1 g in sodium chloride 0.9 % 50 mL IVPB        1 g 100 mL/hr over 30 Minutes Intravenous Every 24 hours 05/29/11 1904 05/30/11 1506   05/28/11 1230   ertapenem (INVANZ) 1 g in sodium chloride 0.9 % 50 mL IVPB        1 g 100 mL/hr over 30 Minutes Intravenous 60 min pre-op 05/28/11 1223 05/29/11 0835          Assessment/Plan: s/p Procedure(s) (LRB): ILEOSTOMY TAKEDOWN (N/A)  Advance po Hopefully home on wednesday  LOS: 5 days    Roseanna Koplin A 06/03/2011

## 2011-06-04 ENCOUNTER — Inpatient Hospital Stay (HOSPITAL_COMMUNITY): Payer: Medicare Other

## 2011-06-04 LAB — CBC
Hemoglobin: 8 g/dL — ABNORMAL LOW (ref 12.0–15.0)
MCHC: 32.3 g/dL (ref 30.0–36.0)
RBC: 2.95 MIL/uL — ABNORMAL LOW (ref 3.87–5.11)
WBC: 9 10*3/uL (ref 4.0–10.5)

## 2011-06-04 LAB — RENAL FUNCTION PANEL
CO2: 27 mEq/L (ref 19–32)
Chloride: 103 mEq/L (ref 96–112)
GFR calc Af Amer: 7 mL/min — ABNORMAL LOW (ref >90)
Glucose, Bld: 80 mg/dL (ref 70–99)
Phosphorus: 3.3 mg/dL (ref 2.3–4.6)
Potassium: 3.7 mEq/L (ref 3.5–5.1)
Sodium: 140 mEq/L (ref 135–145)

## 2011-06-04 LAB — GLUCOSE, CAPILLARY: Glucose-Capillary: 64 mg/dL — ABNORMAL LOW (ref 70–99)

## 2011-06-04 MED ORDER — DARBEPOETIN ALFA-POLYSORBATE 200 MCG/0.4ML IJ SOLN
INTRAMUSCULAR | Status: AC
  Filled 2011-06-04: qty 0.4

## 2011-06-04 MED ORDER — DARBEPOETIN ALFA-POLYSORBATE 200 MCG/0.4ML IJ SOLN: 200.0000 ug | INTRAMUSCULAR | Status: DC

## 2011-06-04 NOTE — Progress Notes (Signed)
Patient ID: [name], female   DOB: [birth year], 61 y.o.   MRN: [id number] 6 Days Post-Op  Subjective: No complaints Having bowel movements and tolerating po  Objective: Vital signs in last 24 hours: Temp:  [98.6 F (37 C)-100.9 F (38.3 C)] 100.9 F (38.3 C) (03/19 0645) Pulse Rate:  [87-112] 104  (03/19 0900) Resp:  [15-18] 18  (03/19 0645) BP: (97-127)/(61-74) 120/73 mmHg (03/19 0900) SpO2:  [90 %-98 %] 91 % (03/19 0645) Weight:  [118 lb 2.7 oz (53.6 kg)] 118 lb 2.7 oz (53.6 kg) (03/19 0645) Last BM Date: 06/02/11  Intake/Output this shift:    Physical Exam: BP 120/73  Pulse 104  Temp(Src) 100.9 F (38.3 C) (Oral)  Resp 18  Ht 5\' 5"  (1.651 m)  Wt 118 lb 2.7 oz (53.6 kg)  BMI 19.66 kg/m2  SpO2 91% Abdomen soft, wound stable  Labs: CBC  Basename 06/04/11 0717 06/03/11 0500  WBC 9.0 7.5  HGB 8.0* 8.4*  HCT 24.8* 26.6*  PLT 330 301   BMET  Basename 06/04/11 0717 06/03/11 0500  NA 140 141  K 3.7 3.5  CL 103 102  CO2 27 30  GLUCOSE 80 79  BUN 22 16  CREATININE 6.84* 5.24*  CALCIUM 9.0 9.0   LFT  Basename 06/04/11 0717  PROT --  ALBUMIN 2.1*  AST --  ALT --  ALKPHOS --  BILITOT --  BILIDIR --  IBILI --  LIPASE --   PT/INR No results found for this basename: LABPROT:2,INR:2 in the last 72 hours ABG No results found for this basename: PHART:2,PCO2:2,PO2:2,HCO3:2 in the last 72 hours  Studies/Results: No results found.  Assessment: Active Problems:  * No active hospital problems. *    Procedure(s): ILEOSTOMY TAKEDOWN  Plan: Hopeful for discharge tomorrow  LOS: 6 days    Georgi Tuel A MD 06/04/2011 9:13 AM

## 2011-06-04 NOTE — Clinical Documentation Improvement (Signed)
Anemia Documentation Clarification Query  THIS DOCUMENT IS NOT A PERMANENT PART OF THE MEDICAL RECORD  RESPOND TO THE THIS QUERY, FOLLOW THE INSTRUCTIONS BELOW:  1. If needed, update documentation for the patient's encounter via the notes activity.  2. Access this query again and click edit on the In [employer].  3. After updating, or not, click F2 to complete all highlighted (required) fields concerning your review. Select "additional documentation in the medical record" OR "no additional documentation provided".  4. Click Sign note button.  5. The deficiency will fall out of your In Basket *Please let us know if you are not able to complete this workflow by phone or e-mail (listed below).          06/04/11  Dear Dr. Coralie Keens  Rolley Sims  In an effort to better capture your patient's severity of illness, reflect appropriate length of stay and utilization of resources, a review of the patient medical record has revealed the following indicators.    Based on your clinical judgment, please clarify and document in a progress note and/or discharge summary the clinical condition associated with the following supporting information:  In responding to this query please exercise your independent judgment.  The fact that a query is asked, does not imply that any particular answer is desired or expected.  Possible Clinical Conditions?  " Post Op Acute Blood Loss Anemia  "           Acute Blood Loss Anemia " Precipitous drop in Hematocrit " Sickle cell anemia  " Other Condition____________  " Cannot Clinically Determine   Supporting Information:  Signs and Symptoms:(Recent surgery)   Per 3/17 progress notes pt. has Post op anemia.   Diagnostics: Component     Latest Ref Rng 05/29/2011 05/29/2011 05/29/2011 05/29/2011 05/29/2011  HGB     12.0 - 15.0 g/dL  12.9   12.9  HCT     36.0 - 46.0 %  38.0   41.7                         Component     Latest Ref Rng 05/30/2011  05/30/2011 05/30/2011 05/30/2011 05/30/2011  HGB     12.0 - 15.0 g/dL 10.5 (L)  9.1 (L)    HCT     36.0 - 46.0 % 33.5 (L)  28.8 (L)     Component     Latest Ref Rng 05/30/2011 05/30/2011 05/30/2011 05/30/2011 05/31/2011  HGB     12.0 - 15.0 g/dL  9.1 (L)     HCT     36.0 - 46.0 %  28.4 (L)      Component     Latest Ref Rng 05/31/2011 05/31/2011 05/31/2011 05/31/2011 05/31/2011  HGB     12.0 - 15.0 g/dL 9.3 (L)      HCT     36.0 - 46.0 % 29.5 (L)                       Component     Latest Ref Rng 06/01/2011 06/01/2011 06/01/2011 06/02/2011 06/02/2011  HGB     12.0 - 15.0 g/dL  8.0 (L)   8.0 (L)  HCT     36.0 - 46.0 %  25.4 (L)   25.6 (L)                         Component     Latest  Ref Rng 06/03/2011 06/03/2011 06/03/2011 06/03/2011 06/03/2011  HGB     12.0 - 15.0 g/dL 8.4 (L)      HCT     36.0 - 46.0 % 26.6 (L)       Component     Latest Ref Rng 06/03/2011 06/04/2011  HGB     12.0 - 15.0 g/dL  8.0 (L)  HCT     36.0 - 46.0 %  24.8 (L)   Treatments: Serial H&H monitoring  You may use possible, probable, or suspect with inpatient documentation. Possible, probable, suspected diagnoses MUST be documented at the time of discharge.  Reviewed: Dr. Ninfa Linden answered query in d/c summary.    Thank You,  Serena Colonel RN, BSN, CCM   Clinical Documentation Specialist:  Pager [phone]   [facility]

## 2011-06-04 NOTE — Procedures (Signed)
Patient seen on diaysis

## 2011-06-04 NOTE — Progress Notes (Signed)
Subjective: Interval History: none. Low grade  Objective: Vital signs in last 24 hours:  Temp:  [98.6 F (37 C)-100.9 F (38.3 C)] 100.9 F (38.3 C) (03/19 0645) Pulse Rate:  [87-112] 105  (03/19 1000) Resp:  [15-18] 18  (03/19 0645) BP: (97-127)/(61-75) 108/65 mmHg (03/19 1000) SpO2:  [90 %-95 %] 91 % (03/19 0645) Weight:  [53.6 kg (118 lb 2.7 oz)] 53.6 kg (118 lb 2.7 oz) (03/19 0645)  Weight change:   Intake/Output: I/O last 3 completed shifts: In: 600 [P.O.:600] Out: -    Intake/Output this shift:     General- pleasant; comfortable, no distress  Cardiovascular: RRR, 3/6 SEM; RUSB  Pulmonary- CTAB  Abdominal: bulky midline abd dressing, +BS  Extremities: no ankle edema  Vascular Access: RUA graft, +T/B  Lab Results:  Basename 06/04/11 0717 06/03/11 0500 06/02/11 0645  WBC 9.0 7.5 8.8  HGB 8.0* 8.4* 8.0*  HCT 24.8* 26.6* 25.6*  PLT 330 301 251   BMET  Basename 06/04/11 0717 06/03/11 0500 06/02/11 0645 06/01/11 1919  NA 140 141 138 --  K 3.7 3.5 3.5 --  CL 103 102 100 --  CO2 27 30 29  --  GLUCOSE 80 79 63* --  BUN 22 16 10  --  CREATININE 6.84* 5.24* 3.63* --  CALCIUM 9.0 9.0 8.7 --  PHOS 3.3 3.3 -- 4.0   LFT  Basename 06/04/11 0717  PROT --  ALBUMIN 2.1*  AST --  ALT --  ALKPHOS --  BILITOT --  BILIDIR --  IBILI --   PT/INR No results found for this basename: LABPROT:2,INR:2 in the last 72 hours Hepatitis Panel No results found for this basename: HEPBSAG,HCVAB,HEPAIGM,HEPBIGM in the last 72 hours  Studies/Results: No results found.  I have reviewed the patient's current medications.  Assessment/Plan: 1. S/P ileostomy takedown 3/13- reports having BM's; no N/V, abd pain this am; surgery following  2. Hypotension/Volume- BP soft; No BP meds; No volume issues; 3. ESRD- TTS [place], 4. Anemia- Hb 8 will follow 5. Secondary Hypoparathyroidism- phos 3.3, no binders; s/p parathyroidectomy, no Vit D  6. Hepatitis C  7. Seizures- no anti-seizure  meds or activity 8. Nutrition/Hypoalbuminemia- Albumin 2.3; previously on clear liq; diet advanced ^ protein yesterday; encourage ^protein supple (Nepro)  9. DM- CBG and SSI per primary; bs 68-86; appetite poor 10. Disposition/Chronic debility- per primary services; PT consult, walker, OOB to chair with meals, needs assist to get up.   LOS: 6 [name] W @TODAY @10 :25 AM

## 2011-06-04 NOTE — Progress Notes (Signed)
Physical Therapy Treatment Patient Details Name: [name] MRN: [id number] DOB: 19-Jul-1950 Today's Date: 06/04/2011  PT Assessment/Plan  PT - Assessment/Plan Comments on Treatment Session: Pt making good progress in therapy, able to ambulate today with Mod A and RW. Continues to be limited by lack of bil DF, but plans to get AFOs at DC.  PT Plan: Discharge plan remains appropriate PT Frequency: Min 3X/week Follow Up Recommendations: Home health PT Equipment Recommended: None recommended by PT PT Goals  Acute Rehab PT Goals PT Goal: Sit to Stand - Progress: Progressing toward goal PT Goal: Stand to Sit - Progress: Progressing toward goal PT Goal: Ambulate - Progress: Progressing toward goal  PT Treatment Precautions/Restrictions  Restrictions Weight Bearing Restrictions: No Mobility (including Balance) Bed Mobility Bed Mobility: No Transfers Sit to Stand: With armrests;With upper extremity assist;From chair/3-in-1;3: Mod assist Sit to Stand Details (indicate cue type and reason): Performed x3 for strengthening. Assist for lifting, pt able to scoot and initiate on own.  Stand to Sit: With upper extremity assist;With armrests;To chair/3-in-1;3: Mod assist Stand to Sit Details: Cues for fully controlling descent with UEs to decrease abdominal soreness Ambulation/Gait Ambulation/Gait: Yes Ambulation/Gait Assistance: 3: Mod assist Ambulation/Gait Assistance Details (indicate cue type and reason): Mod A for steadying due to decreased foot clearance bil. PT will be getting AFOs at DC. Distance limited by fatigue.  Ambulation Distance (Feet): 15 Feet Assistive device: Rolling walker Gait Pattern: Decreased stride length;Step-through pattern;Right steppage;Left steppage;Decreased dorsiflexion - right;Decreased dorsiflexion - left (Increased hip/knee flexion to compensate for DF) Stairs: No Wheelchair Mobility Wheelchair Mobility: No  Posture/Postural Control Posture/Postural  Control: No significant limitations Balance Balance Assessed: No Exercise  General Exercises - Lower Extremity Ankle Circles/Pumps: Seated;10 reps;Both;AAROM;Strengthening (Assist DF, AROM PF) Long Arc Quad: Seated;10 reps;Both;Strengthening;AROM;AAROM (x2, AAROM L for full motion) Hip Flexion/Marching: Seated;10 reps;Both;Strengthening;AROM (x2) End of Session PT - End of Session Equipment Utilized During Treatment: Gait belt Activity Tolerance: Patient tolerated treatment well Patient left: in bed;with call bell in reach Nurse Communication: Mobility status for transfers;Mobility status for ambulation General Behavior During Session: [facility] for tasks performed Cognition: [facility] for tasks performed  Soundra Pilon, [place]  06/04/2011, 2:43 PM

## 2011-06-05 MED ORDER — OXYCODONE-ACETAMINOPHEN 5-325 MG PO TABS: ORAL_TABLET | ORAL | Status: AC

## 2011-06-05 NOTE — Progress Notes (Signed)
UR of chart updated.   Pt working with HHPT from [place] prior to admission. PT recommending resuming previous HHPT. MD order obtained. [facility] aware of need--spoke with Clover Mealy via phone. No Face to Face needed as this was a previous service prior to this admit.

## 2011-06-05 NOTE — Progress Notes (Signed)
Patient ID: [name], female   DOB: [birth year], 61 y.o.   MRN: [id number]  Doing well Tolerating po Wound clean  Plan:  Discharge home

## 2011-06-05 NOTE — Discharge Summary (Signed)
Physician Discharge Summary  Patient ID: ANNALYN BINZ MRN: [id number] DOB/AGE: Jul 17, 1950 61 y.o.  Admit date: 05/29/2011 Discharge date: 06/05/2011  Admission Diagnoses: ileostomy  Discharge Diagnoses: ileostomy s/p takedown Active Problems:  * No active hospital problems. *  Underweight Anemia of chronic disease  Discharged Condition: good  Hospital Course: underwent ileostomy takedown and reanastomosis on day of admit.  Chronic hemodialysis post op.  Uneventful post op recovery.  Able to advance to regular diet post op.  Having BM's and incision healing well  Consults: nephrology  Significant Diagnostic Studies: 0  Treatments: surgery: ileostomy takedown  Discharge Exam: Blood pressure 112/65, pulse 84, temperature 98.2 F (36.8 C), temperature source Oral, resp. rate 18, height 5\' 5"  (1.651 m), weight 116 lb 6.5 oz (52.8 kg), SpO2 92.00%. General appearance: alert and no distress GI: soft, non-tender; bowel sounds normal; no masses,  no organomegaly Incision/Wound:clean  Disposition: 01-Home or Self Care   Medication List  As of 06/05/2011  7:37 AM   TAKE these medications         darbepoetin 200 MCG/0.4ML Soln   Commonly known as: ARANESP   Inject 200 mcg into the skin every 7 (seven) days. Administered at dialysis per nephrology      gabapentin 300 MG capsule   Commonly known as: NEURONTIN   Take 300 mg by mouth daily. Started Friday PTA      metoprolol succinate 12.5 mg Tb24   Commonly known as: TOPROL-XL   Take 12.5 mg by mouth daily.      multivitamin Tabs tablet   Take 1 tablet by mouth daily.      oxyCODONE-acetaminophen 5-325 MG per tablet   Commonly known as: PERCOCET   Take 1 to 2 percocet as needed for pain      pantoprazole 40 MG tablet   Commonly known as: PROTONIX   Take 40 mg by mouth daily at 12 noon.           Follow-up Information    Follow up with [facility], MD. Call in 2 weeks. [phone])    Contact information:   [facility] [address], [place] [phone]          Signed: Harl Bowie 06/05/2011, 7:37 AM

## 2011-06-05 NOTE — Progress Notes (Signed)
Subjective: Interval History: none.  Objective: Vital signs in last 24 hours:  Temp:  [97.2 F (36.2 C)-99.7 F (37.6 C)] 98.2 F (36.8 C) (03/20 0616) Pulse Rate:  [84-107] 84  (03/20 0616) Resp:  [18-20] 18  (03/20 0616) BP: (91-121)/(61-72) 112/65 mmHg (03/20 0616) SpO2:  [92 %-98 %] 92 % (03/20 0616) Weight:  [52.8 kg (116 lb 6.5 oz)] 52.8 kg (116 lb 6.5 oz) (03/19 2040)  Weight change: -0.8 kg (-1 lb 12.2 oz)  Intake/Output: I/O last 3 completed shifts: In: 360 [P.O.:360] Out: 501 [Other:501]   Intake/Output this shift:     General- pleasant; comfortable, no distress  Cardiovascular: RRR, 3/6 SEM; RUSB  Pulmonary- CTAB  Abdominal: bulky midline abd dressing, +BS  Extremities: no ankle edema  Vascular Access: RUA graft, +T/B   Lab Results:  Basename 06/04/11 0717 06/03/11 0500  WBC 9.0 7.5  HGB 8.0* 8.4*  HCT 24.8* 26.6*  PLT 330 301   BMET  Basename 06/04/11 0717 06/03/11 0500  NA 140 141  K 3.7 3.5  CL 103 102  CO2 27 30  GLUCOSE 80 79  BUN 22 16  CREATININE 6.84* 5.24*  CALCIUM 9.0 9.0  PHOS 3.3 3.3   LFT  Basename 06/04/11 0717  PROT --  ALBUMIN 2.1*  AST --  ALT --  ALKPHOS --  BILITOT --  BILIDIR --  IBILI --   PT/INR No results found for this basename: LABPROT:2,INR:2 in the last 72 hours Hepatitis Panel No results found for this basename: HEPBSAG,HCVAB,HEPAIGM,HEPBIGM in the last 72 hours  Studies/Results: No results found.  I have reviewed the patient's current medications.  Assessment/Plan: 1. S/P ileostomy takedown 3/13- reports having BM's; no N/V, abd pain this am; surgery following  2. Hypotension/Volume- BP soft; No BP meds; No volume issues; 3. ESRD- TTS [place], 4. Anemia- Hb 8 stable 5. Secondary Hypoparathyroidism- phos 3.3, no binders; s/p parathyroidectomy, no Vit D  6. Hepatitis C  7. Seizures- no anti-seizure meds or activity 8. Nutrition/Hypoalbuminemia- Albumin 2's 9. DM- per  primary 10. Disposition/Chronic debility- per primary services      LOS: 7 Niccolas Loeper W @TODAY @10 :16 AM

## 2011-06-05 NOTE — Progress Notes (Signed)
Pt  discharged to home after d/c summary reviewed and pt and daughter capable of re verbalizing medications and follow up appointments. Pt remains stable. No signs and symptoms of distress. Educated to return to ER in the event of SOB, dizziness, chest pain, or fainting. Jobe Igo, RN

## 2011-06-05 NOTE — Discharge Instructions (Signed)
[facility] [phone]  OPEN ABDOMINAL SURGERY: POST OP INSTRUCTIONS  Always review your discharge instruction sheet given to you by the facility where your surgery was performed.  IF YOU HAVE DISABILITY OR FAMILY LEAVE FORMS, YOU MUST BRING THEM TO THE OFFICE FOR PROCESSING.  PLEASE DO NOT GIVE THEM TO YOUR DOCTOR.  1. A prescription for pain medication may be given to you upon discharge.  Take your pain medication as prescribed, if needed.  If narcotic pain medicine is not needed, then you may take acetaminophen (Tylenol) or ibuprofen (Advil) as needed. 2. Take your usually prescribed medications unless otherwise directed. 3. If you need a refill on your pain medication, please contact your pharmacy. They will contact our office to request authorization.  Prescriptions will not be filled after 5pm or on week-ends. 4. You should follow a light diet the first few days after arrival home, such as soup and crackers, pudding, etc.unless your doctor has advised otherwise. A high-fiber, low fat diet can be resumed as tolerated.   Be sure to include lots of fluids daily. Most patients will experience some swelling and bruising on the chest and neck area.  Ice packs will help.  Swelling and bruising can take several days to resolve 5. Most patients will experience some swelling and bruising in the area of the incision. Ice pack will help. Swelling and bruising can take several days to resolve..  6. It is common to experience some constipation if taking pain medication after surgery.  Increasing fluid intake and taking a stool softener will usually help or prevent this problem from occurring.  A mild laxative (Milk of Magnesia or Miralax) should be taken according to package directions if there are no bowel movements after 48 hours. 7.  You may have steri-strips (small skin tapes) in place directly over the incision.  These strips should be left on the skin for 7-10 days.  If your  surgeon used skin glue on the incision, you may shower in 24 hours.  The glue will flake off over the next 2-3 weeks.  Any sutures or staples will be removed at the office during your follow-up visit. You may find that a light gauze bandage over your incision may keep your staples from being rubbed or pulled. You may shower and replace the bandage daily. 8. ACTIVITIES:  You may resume regular (light) daily activities beginning the next day--such as daily self-care, walking, climbing stairs--gradually increasing activities as tolerated.  You may have sexual intercourse when it is comfortable.  Refrain from any heavy lifting or straining until approved by your doctor. a. You may drive when you no longer are taking prescription pain medication, you can comfortably wear a seatbelt, and you can safely maneuver your car and apply brakes b. Return to Work: ___________________________________ 9. You should see your doctor in the office for a follow-up appointment approximately two weeks after your surgery.  Make sure that you call for this appointment within a day or two after you arrive home to insure a convenient appointment time. OTHER INSTRUCTIONS:  _____________________________________________________________ _____________________________________________________________  WHEN TO CALL YOUR DOCTOR: 1. Fever over 101.0 2. Inability to urinate 3. Nausea and/or vomiting 4. Extreme swelling or bruising 5. Continued bleeding from incision. 6. Increased pain, redness, or drainage from the incision. 7. Difficulty swallowing or breathing 8. Muscle cramping or spasms. 9. Numbness or tingling in hands or feet or around lips.  The clinic staff is available to   answer your questions during regular business hours.  Please don't hesitate to call and ask to speak to one of the nurses if you have concerns.  For further questions, please visit [email or web address]   

## 2011-06-10 DIAGNOSIS — Z0271 Encounter for disability determination: Secondary | ICD-10-CM

## 2011-06-12 ENCOUNTER — Encounter: Payer: Self-pay | Admitting: Physical Medicine & Rehabilitation

## 2011-06-12 DIAGNOSIS — E46 Unspecified protein-calorie malnutrition: Secondary | ICD-10-CM | POA: Insufficient documentation

## 2011-06-17 ENCOUNTER — Telehealth (INDEPENDENT_AMBULATORY_CARE_PROVIDER_SITE_OTHER): Payer: Self-pay | Admitting: General Surgery

## 2011-06-17 NOTE — Telephone Encounter (Signed)
Pt's daughter calling for post op appt: made for 07/01/11.  She still has staples and needs nurse-only appt for removal.  Daughter reports she is doing very well since the take-down of her colostomy.

## 2011-06-20 ENCOUNTER — Encounter (INDEPENDENT_AMBULATORY_CARE_PROVIDER_SITE_OTHER): Payer: Self-pay | Admitting: General Surgery

## 2011-06-20 ENCOUNTER — Other Ambulatory Visit: Payer: Self-pay | Admitting: *Deleted

## 2011-06-20 ENCOUNTER — Other Ambulatory Visit: Payer: Self-pay | Admitting: Internal Medicine

## 2011-06-20 ENCOUNTER — Ambulatory Visit (INDEPENDENT_AMBULATORY_CARE_PROVIDER_SITE_OTHER): Payer: Medicare Other | Admitting: General Surgery

## 2011-06-20 VITALS — BP 101/60 | HR 74 | Temp 98.6°F | Resp 14 | Ht 65.0 in | Wt 101.0 lb

## 2011-06-20 DIAGNOSIS — Z9889 Other specified postprocedural states: Secondary | ICD-10-CM

## 2011-06-20 DIAGNOSIS — N186 End stage renal disease: Secondary | ICD-10-CM

## 2011-06-20 DIAGNOSIS — K219 Gastro-esophageal reflux disease without esophagitis: Secondary | ICD-10-CM

## 2011-06-20 DIAGNOSIS — Z932 Ileostomy status: Secondary | ICD-10-CM

## 2011-06-20 MED ORDER — PANTOPRAZOLE SODIUM 40 MG PO TBEC: 40.0000 mg | DELAYED_RELEASE_TABLET | Freq: Every day | ORAL | Status: DC

## 2011-06-20 MED ORDER — DIALYVITE 800/ZINC 0.8 MG PO TABS: 1.0000 | ORAL_TABLET | Freq: Every day | ORAL | Status: DC

## 2011-06-20 NOTE — Telephone Encounter (Signed)
Also a refill Dialyvite w/ zinc - last refill 05/02/11.

## 2011-06-25 ENCOUNTER — Inpatient Hospital Stay (HOSPITAL_COMMUNITY)
Admission: EM | Admit: 2011-06-25 | Discharge: 2011-06-28 | DRG: 391 | Disposition: A | Discharge status: Home / Self Care | Payer: Medicare Other | Attending: Internal Medicine | Admitting: Internal Medicine

## 2011-06-25 ENCOUNTER — Encounter (HOSPITAL_COMMUNITY): Payer: Self-pay | Admitting: *Deleted

## 2011-06-25 DIAGNOSIS — I959 Hypotension, unspecified: Secondary | ICD-10-CM | POA: Diagnosis present

## 2011-06-25 DIAGNOSIS — Z993 Dependence on wheelchair: Secondary | ICD-10-CM

## 2011-06-25 DIAGNOSIS — M479 Spondylosis, unspecified: Secondary | ICD-10-CM | POA: Diagnosis present

## 2011-06-25 DIAGNOSIS — B1920 Unspecified viral hepatitis C without hepatic coma: Secondary | ICD-10-CM | POA: Diagnosis present

## 2011-06-25 DIAGNOSIS — A088 Other specified intestinal infections: Principal | ICD-10-CM | POA: Diagnosis present

## 2011-06-25 DIAGNOSIS — R197 Diarrhea, unspecified: Secondary | ICD-10-CM

## 2011-06-25 DIAGNOSIS — N189 Chronic kidney disease, unspecified: Secondary | ICD-10-CM | POA: Diagnosis present

## 2011-06-25 DIAGNOSIS — I9589 Other hypotension: Secondary | ICD-10-CM | POA: Diagnosis present

## 2011-06-25 DIAGNOSIS — I350 Nonrheumatic aortic (valve) stenosis: Secondary | ICD-10-CM | POA: Diagnosis present

## 2011-06-25 DIAGNOSIS — Z992 Dependence on renal dialysis: Secondary | ICD-10-CM | POA: Diagnosis present

## 2011-06-25 DIAGNOSIS — K219 Gastro-esophageal reflux disease without esophagitis: Secondary | ICD-10-CM | POA: Diagnosis present

## 2011-06-25 DIAGNOSIS — I359 Nonrheumatic aortic valve disorder, unspecified: Secondary | ICD-10-CM | POA: Diagnosis present

## 2011-06-25 DIAGNOSIS — Z79899 Other long term (current) drug therapy: Secondary | ICD-10-CM

## 2011-06-25 DIAGNOSIS — I120 Hypertensive chronic kidney disease with stage 5 chronic kidney disease or end stage renal disease: Secondary | ICD-10-CM | POA: Diagnosis present

## 2011-06-25 DIAGNOSIS — Z905 Acquired absence of kidney: Secondary | ICD-10-CM

## 2011-06-25 DIAGNOSIS — N186 End stage renal disease: Secondary | ICD-10-CM | POA: Diagnosis present

## 2011-06-25 DIAGNOSIS — D631 Anemia in chronic kidney disease: Secondary | ICD-10-CM | POA: Diagnosis present

## 2011-06-25 DIAGNOSIS — Z87891 Personal history of nicotine dependence: Secondary | ICD-10-CM

## 2011-06-25 DIAGNOSIS — R000 Tachycardia, unspecified: Secondary | ICD-10-CM | POA: Diagnosis present

## 2011-06-25 DIAGNOSIS — N2581 Secondary hyperparathyroidism of renal origin: Secondary | ICD-10-CM | POA: Diagnosis present

## 2011-06-25 DIAGNOSIS — Z681 Body mass index (BMI) 19 or less, adult: Secondary | ICD-10-CM

## 2011-06-25 LAB — COMPREHENSIVE METABOLIC PANEL
ALT: 12 U/L (ref 0–35)
AST: 21 U/L (ref 0–37)
CO2: 18 mEq/L — ABNORMAL LOW (ref 19–32)
Calcium: 10.4 mg/dL (ref 8.4–10.5)
Chloride: 102 mEq/L (ref 96–112)
Creatinine, Ser: 10.09 mg/dL — ABNORMAL HIGH (ref 0.50–1.10)
GFR calc Af Amer: 4 mL/min — ABNORMAL LOW (ref >90)
GFR calc non Af Amer: 4 mL/min — ABNORMAL LOW (ref >90)
Glucose, Bld: 93 mg/dL (ref 70–99)
Total Bilirubin: 0.3 mg/dL (ref 0.3–1.2)

## 2011-06-25 LAB — DIFFERENTIAL
Eosinophils Relative: 8 % — ABNORMAL HIGH (ref 0–5)
Lymphocytes Relative: 40 % (ref 12–46)
Lymphs Abs: 2 10*3/uL (ref 0.7–4.0)
Monocytes Absolute: 0.9 10*3/uL (ref 0.1–1.0)
Monocytes Relative: 19 % — ABNORMAL HIGH (ref 3–12)
Neutro Abs: 1.6 10*3/uL — ABNORMAL LOW (ref 1.7–7.7)

## 2011-06-25 LAB — CBC
HCT: 43.6 % (ref 36.0–46.0)
Hemoglobin: 13.3 g/dL (ref 12.0–15.0)
MCV: 86 fL (ref 78.0–100.0)
RBC: 5.07 MIL/uL (ref 3.87–5.11)
WBC: 4.9 10*3/uL (ref 4.0–10.5)

## 2011-06-25 MED ORDER — SODIUM CHLORIDE 0.9 % IV BOLUS (SEPSIS)
500.0000 mL | INTRAVENOUS | Status: AC
  Administered 2011-06-25: 500 mL via INTRAVENOUS

## 2011-06-25 NOTE — ED Notes (Signed)
Pt states diarrhea every time she drinks or eats since yesterday..  Pt is dialysis pt, [facility], but was unable to go to dialysis today r/t diarrhea.  States abd pain only with diarrhea.  Presently denies pain or nausea.

## 2011-06-25 NOTE — ED Provider Notes (Cosign Needed)
History     CSN: [id number]  Arrival date & time 06/25/11  1825   First MD Initiated Contact with Patient 06/25/11 2007      Chief Complaint  Patient presents with  . Diarrhea    (Consider location/radiation/quality/duration/timing/severity/associated sxs/prior treatment) HPI  Patient had a reversal of a ileostomy on March 13. She's been seen by Dr. Ninfa Linden and had her staples removed. She still has Steri-Strips in place. She relates she has been able to eat solid foods and has been having normal bowel movements. She relates however Friday, 4 days ago she started having diarrhea. She states every time she tries to eat or drink she gets a loose watery stool. She states this is happening 6-7 times a day. She denies nausea, vomiting, or abdominal pain. She has a decreased appetite. She denies chills or fever. She denies any change in her diet that could have made her ill. She denies being around anybody else is sick and denies been on antibiotics.  Patient has end-stage renal disease and gets dialysis on Tuesday, Thursday, and Saturday. She missed her dialysis today which is Tuesday. She hasn't done at [facility]. She is followed by [facility].  PCP outpatient clinics Dr. Donna Bernard [facility]  Past Medical History  Diagnosis Date  . Seizures     r/t HTN in 1990's x 1  . Brain aneurysm     No records could be found  . Blood transfusion 1990's    r/t Kidney removal surgery  . Arthritis     Back  . Anemia     anemia of chronic disease likely 2/2 ESRD per last anemia panel (02/2011) with Fe 35, TIBC 167, ferritin 2041  // BL Hgb 8-10  . Umbilical hernia age 24  . Heart murmur     Born with heart mumur, does not require follow up per pt  . Hypertension     Does not see a heart doctor, had pre transplant stress test at [facility]    . End stage renal disease on dialysis     Secondary to hypertension // T/Th/Sat dialysis on [employer]  . Hepatitis C   .  Colitis, ischemic 01/2011    S/P partial colectomy of right hemicolon and ileostomy placement  . Lumbar spinal stenosis      bilateral L4-5 and L5-S1 posterior lumbar interbody arthrodesis and bilateral L4-S1 posterior lateral arthrodesis for lumbar stenosis dynamic lumbar spondylolisthesis lumbar spondylosis lumbar radiculopathy with foot drop // s/p surgery 01/2011 - see surgery section for details.  Marland Kitchen Dysrhythmia     SVT for brief period in Feb 2013  . Dialysis patient     [place]  . Diabetes mellitus     Borderline  . Foot drop, bilateral     after back surgery  . Anuria     Due to dialysis  . Aortic stenosis, mild     03/2011 echo    Past Surgical History  Procedure Date  . Nephrectomy 2010    right side done at [facility] on transplant list  . Umbilical hernia repair age 84- 70  . Appendectomy 1960's  . Vascular surgery     right arm dialysis graft  . Tonsillectomy     as a child  . Rib resection     d/t kidney removal  . Back surgery 01/30/2011    L4-S1 lumbar laminectomy facetectomy and foraminotomies for decompression a bilateral L4-5 and L5-S1 posterior lumbar interbody arthrodesis and bilateral  L4-S1 posterior lateral arthrodesis for lumbar stenosis dynamic lumbar spondylolisthesis lumbar spondylosis lumbar radiculopathy with foot drop  . Laparotomy 02/09/2011    Procedure: EXPLORATORY LAPAROTOMY;  Surgeon: Harl Bowie, MD;  Location: [place];  Service: General;  Laterality: N/A;  . Partial colectomy 02/09/2011    Reason for surgery: ischemic colitis of right hemicolon; Surgeon: Harl Bowie, MD;  Location: [place];  Service: General;  Laterality: Right;  . Colostomy 02/09/2011    Reason for surgery: ischemic colitis of right hemicolon requiring partial colectomy and ileostomy.  Surgeon: Harl Bowie, MD;  Location: [place];  Service: General;  Laterality: Right;  . Colonoscopy 02/19/2011    Procedure: COLONOSCOPY;  Surgeon:  Missy Sabins, MD;  Location: [place];  Service: Endoscopy;  Laterality: N/A;  . Colonoscopy 03/07/2011    Procedure: COLONOSCOPY;  Surgeon: Cleotis Nipper, MD;  Location: [facility] ENDOSCOPY;  Service: Endoscopy;  Laterality: N/A;  no prep needed--pt has long Hartmann's pouch and ileostomy  . Total parathyroidectomy 01/17/2001    with autotransplantation into left forearm // due to secondary hyperparathyroidism from ESRD  . Av fistula placement 04/08/2011    Procedure: INSERTION OF ARTERIOVENOUS (AV) GORE-TEX GRAFT ARM;  Surgeon: Rosetta Posner, MD;  Location: [facility] OR;  Service: Vascular;  Laterality: Right;  Revision of Upper Arm Gore-Tex Graft  . Ileostomy closure 05/29/2011    Procedure: ILEOSTOMY TAKEDOWN;  Surgeon: Harl Bowie, MD;  Location: [facility] OR;  Service: General;  Laterality: N/A;    Family History  Problem Relation Age of Onset  . Anesthesia problems Neg Hx   . Hypotension Neg Hx   . Malignant hyperthermia Neg Hx   . Pseudochol deficiency Neg Hx   . Diabetes Father   . Stroke Mother     History  Substance Use Topics  . Smoking status: Former Smoker -- 0.5 packs/day for 48 years    Types: Cigarettes    Quit date: 08/17/2010  . Smokeless tobacco: Never Used  . Alcohol Use: No     1 pint on Fridays   lives with daughter Quit drinking in November Getting physical therapy for dropped foot after back surgery Wheelchair-bound    OB History    Grav Para Term Preterm Abortions TAB SAB Ect Mult Living                  Review of Systems  All other systems reviewed and are negative.    Allergies  Contrast media and Iohexol  Home Medications   Current Outpatient Rx  Name Route Sig Dispense Refill  . DIALYVITE 800/ZINC 0.8 MG PO TABS Oral Take 1 tablet by mouth daily.    Marland Kitchen DARBEPOETIN ALFA-POLYSORBATE 200 MCG/0.4ML IJ SOLN Subcutaneous Inject 200 mcg into the skin every 7 (seven) days. Administered at dialysis per nephrology    . GABAPENTIN 300 MG PO CAPS Oral Take  600 mg by mouth every morning.     Marland Kitchen METOPROLOL SUCCINATE 12.5 MG HALF TABLET Oral Take 12.5 mg by mouth daily.    Marland Kitchen RENA-VITE PO TABS Oral Take 1 tablet by mouth daily.    Marland Kitchen PANTOPRAZOLE SODIUM 40 MG PO TBEC Oral Take 40 mg by mouth daily at 12 noon.      BP 80/62  Pulse 102  Temp(Src) 98.2 F (36.8 C) (Oral)  Resp 18  SpO2 94%  Vital signs normal except hypotension, tachycardia   Physical Exam  Nursing note and vitals reviewed. Constitutional: She is oriented to  person, place, and time.  Non-toxic appearance. She does not appear ill. No distress.       Thin female who is alert and cooperative  HENT:  Head: Normocephalic and atraumatic.  Right Ear: External ear normal.  Left Ear: External ear normal.  Nose: Nose normal. No mucosal edema or rhinorrhea.  Mouth/Throat: Mucous membranes are normal. No dental abscesses or uvula swelling.       Tongue dry  Eyes: Conjunctivae and EOM are normal. Pupils are equal, round, and reactive to light.  Neck: Normal range of motion and full passive range of motion without pain. Neck supple.  Cardiovascular: Normal rate, regular rhythm and normal heart sounds.  Exam reveals no gallop and no friction rub.   No murmur heard. Pulmonary/Chest: Effort normal and breath sounds normal. No respiratory distress. She has no wheezes. She has no rhonchi. She has no rales. She exhibits no tenderness and no crepitus.  Abdominal: Soft. Normal appearance and bowel sounds are normal. She exhibits no distension. There is no tenderness. There is no rebound and no guarding.       Patient's surgical wound appears to be healing well.  Musculoskeletal: Normal range of motion. She exhibits no edema and no tenderness.       Moves all extremities well.   Neurological: She is alert and oriented to person, place, and time. She has normal strength. No cranial nerve deficit.  Skin: Skin is warm, dry and intact. No rash noted. No erythema. No pallor.  Psychiatric: She has a  normal mood and affect. Her speech is normal and behavior is normal. Her mood appears not anxious.    ED Course  Procedures (including critical care time)    Medications  pantoprazole (PROTONIX) 40 MG tablet (not administered)  B Complex-C-Zn-Folic Acid (DIALYVITE Q000111Q WITH ZINC) 0.8 MG TABS (not administered)  sodium chloride 0.9 % bolus 500 mL (500 mL Intravenous Given 06/25/11 2058)  sodium chloride 0.9 % bolus 500 mL (500 mL Intravenous Given 06/25/11 2230)   Patient given 500 cc bolus of normal saline. Her BP was as low as 80/62. It improved to 87/62. She received a second 500 cc bolus of fluid and her blood pressure is currently 103/66. I am going to try oral fluids. Patient is on bed. Now getting her first diarrhea sample. Patient states she feels like she will be able to go home.  00:41 Dr Evette Doffing, [facility] will come see patient.   Results for orders placed during the hospital encounter of 06/25/11  CBC      Component Value Range   WBC 4.9  4.0 - 10.5 (K/uL)   RBC 5.07  3.87 - 5.11 (MIL/uL)   Hemoglobin 13.3  12.0 - 15.0 (g/dL)   HCT 43.6  36.0 - 46.0 (%)   MCV 86.0  78.0 - 100.0 (fL)   MCH 26.2  26.0 - 34.0 (pg)   MCHC 30.5  30.0 - 36.0 (g/dL)   RDW 18.9 (*) 11.5 - 15.5 (%)   Platelets 232  150 - 400 (K/uL)  DIFFERENTIAL      Component Value Range   Neutrophils Relative 32 (*) 43 - 77 (%)   Neutro Abs 1.6 (*) 1.7 - 7.7 (K/uL)   Lymphocytes Relative 40  12 - 46 (%)   Lymphs Abs 2.0  0.7 - 4.0 (K/uL)   Monocytes Relative 19 (*) 3 - 12 (%)   Monocytes Absolute 0.9  0.1 - 1.0 (K/uL)   Eosinophils Relative 8 (*) 0 -  5 (%)   Eosinophils Absolute 0.4  0.0 - 0.7 (K/uL)   Basophils Relative 2 (*) 0 - 1 (%)   Basophils Absolute 0.1  0.0 - 0.1 (K/uL)  COMPREHENSIVE METABOLIC PANEL      Component Value Range   Sodium 138  135 - 145 (mEq/L)   Potassium 4.0  3.5 - 5.1 (mEq/L)   Chloride 102  96 - 112 (mEq/L)   CO2 18 (*) 19 - 32 (mEq/L)   Glucose, Bld 93  70 - 99 (mg/dL)   BUN 30 (*)  6 - 23 (mg/dL)   Creatinine, Ser 10.09 (*) 0.50 - 1.10 (mg/dL)   Calcium 10.4  8.4 - 10.5 (mg/dL)   Total Protein 8.4 (*) 6.0 - 8.3 (g/dL)   Albumin 3.5  3.5 - 5.2 (g/dL)   AST 21  0 - 37 (U/L)   ALT 12  0 - 35 (U/L)   Alkaline Phosphatase 112  39 - 117 (U/L)   Total Bilirubin 0.3  0.3 - 1.2 (mg/dL)   GFR calc non Af Amer 4 (*) >90 (mL/min)   GFR calc Af Amer 4 (*) >90 (mL/min)   Laboratory interpretation all normal except renal failure, mild metabolic acidosis      No results found.   1. Diarrhea   2. Hypotension   3. End stage renal disease     Plan admission  Rolland Porter, MD, [place], MD 06/26/11 [phone]

## 2011-06-25 NOTE — ED Notes (Signed)
Patient with reported diarrhea for 5 days.  She denies pain,  Denies n/v.  Patient states she cannot eat due to diarrhea.  Patient is dialysis patient,  Last treatment was on Saturday.  She did not go today due to having diarrhea

## 2011-06-25 NOTE — ED Notes (Signed)
Pt continues to deny pain or nausea.  No diarrhea or emesis.  SBP continues to be belwo 100, though pt remains asymptomatic.

## 2011-06-26 ENCOUNTER — Encounter (HOSPITAL_COMMUNITY): Payer: Self-pay | Admitting: Internal Medicine

## 2011-06-26 DIAGNOSIS — R197 Diarrhea, unspecified: Secondary | ICD-10-CM

## 2011-06-26 DIAGNOSIS — I959 Hypotension, unspecified: Secondary | ICD-10-CM | POA: Diagnosis present

## 2011-06-26 LAB — CBC
HCT: 40.8 % (ref 36.0–46.0)
Hemoglobin: 12.4 g/dL (ref 12.0–15.0)
MCV: 86.3 fL (ref 78.0–100.0)
RDW: 18.6 % — ABNORMAL HIGH (ref 11.5–15.5)
WBC: 5.2 10*3/uL (ref 4.0–10.5)

## 2011-06-26 LAB — RENAL FUNCTION PANEL
BUN: 34 mg/dL — ABNORMAL HIGH (ref 6–23)
Chloride: 106 mEq/L (ref 96–112)
Creatinine, Ser: 10.55 mg/dL — ABNORMAL HIGH (ref 0.50–1.10)
Glucose, Bld: 53 mg/dL — ABNORMAL LOW (ref 70–99)
Potassium: 4.3 mEq/L (ref 3.5–5.1)

## 2011-06-26 LAB — PHOSPHORUS: Phosphorus: 4.7 mg/dL — ABNORMAL HIGH (ref 2.3–4.6)

## 2011-06-26 MED ORDER — PANTOPRAZOLE SODIUM 40 MG PO TBEC
40.0000 mg | DELAYED_RELEASE_TABLET | Freq: Every day | ORAL | Status: DC
  Administered 2011-06-26 – 2011-06-28 (×3): 40 mg via ORAL
  Filled 2011-06-26 (×3): qty 1

## 2011-06-26 MED ORDER — GABAPENTIN 600 MG PO TABS
600.0000 mg | ORAL_TABLET | Freq: Every day | ORAL | Status: DC
  Administered 2011-06-27 – 2011-06-28 (×2): 600 mg via ORAL
  Filled 2011-06-26 (×3): qty 1

## 2011-06-26 MED ORDER — DARBEPOETIN ALFA-POLYSORBATE 200 MCG/0.4ML IJ SOLN: 200.0000 ug | INTRAMUSCULAR | Status: DC

## 2011-06-26 MED ORDER — DIALYVITE 800/ZINC 0.8 MG PO TABS: 1.0000 | ORAL_TABLET | Freq: Every day | ORAL | Status: DC

## 2011-06-26 MED ORDER — ACETAMINOPHEN 650 MG RE SUPP: 650.0000 mg | Freq: Four times a day (QID) | RECTAL | Status: DC | PRN

## 2011-06-26 MED ORDER — HYDROCORTISONE 1 % EX CREA
TOPICAL_CREAM | Freq: Two times a day (BID) | CUTANEOUS | Status: DC | PRN
  Filled 2011-06-26 (×2): qty 28

## 2011-06-26 MED ORDER — RENA-VITE PO TABS
1.0000 | ORAL_TABLET | Freq: Every day | ORAL | Status: DC
  Administered 2011-06-26 – 2011-06-27 (×2): 1 via ORAL
  Filled 2011-06-26 (×3): qty 1

## 2011-06-26 MED ORDER — SODIUM CHLORIDE 0.9 % IJ SOLN
3.0000 mL | Freq: Two times a day (BID) | INTRAMUSCULAR | Status: DC
  Administered 2011-06-26 – 2011-06-27 (×3): 3 mL via INTRAVENOUS

## 2011-06-26 MED ORDER — ENOXAPARIN SODIUM 30 MG/0.3ML ~~LOC~~ SOLN
30.0000 mg | Freq: Every day | SUBCUTANEOUS | Status: DC
  Administered 2011-06-26 – 2011-06-28 (×3): 30 mg via SUBCUTANEOUS
  Filled 2011-06-26 (×3): qty 0.3

## 2011-06-26 MED ORDER — LOPERAMIDE HCL 2 MG PO CAPS
2.0000 mg | ORAL_CAPSULE | ORAL | Status: DC | PRN
  Administered 2011-06-26 – 2011-06-27 (×2): 2 mg via ORAL
  Filled 2011-06-26 (×4): qty 1

## 2011-06-26 MED ORDER — METOPROLOL SUCCINATE 12.5 MG HALF TABLET
12.5000 mg | ORAL_TABLET | Freq: Every day | ORAL | Status: DC
  Administered 2011-06-26 – 2011-06-28 (×3): 12.5 mg via ORAL
  Filled 2011-06-26 (×3): qty 1

## 2011-06-26 MED ORDER — ACETAMINOPHEN 325 MG PO TABS: 650.0000 mg | ORAL_TABLET | Freq: Four times a day (QID) | ORAL | Status: DC | PRN

## 2011-06-26 MED ORDER — HEPARIN SODIUM (PORCINE) 1000 UNIT/ML DIALYSIS
20.0000 [IU]/kg | INTRAMUSCULAR | Status: DC | PRN
  Filled 2011-06-26: qty 1

## 2011-06-26 NOTE — Evaluation (Signed)
Physical Therapy Evaluation and Discharge.   Patient Details Name: [name] MRN: [id number] DOB: 1950/07/07 Today's Date: 06/26/2011  Problem List:  Patient Active Problem List  Diagnoses  . Lumbar stenosis with neurogenic claudication  . Spondylolisthesis of lumbar region  . End stage renal disease on dialysis  . Hypertension  . Unspecified viral hepatitis C without hepatic coma  . Radiculopathy  . Anemia of chronic kidney failure  . Acute ischemic colitis  . Radiculopathy of lumbar region  . Altered mental status  . Sinus tachycardia  . Ileostomy in place  . Tachycardia  . Aortic stenosis  . Preop cardiovascular exam  . Diarrhea  . Hypotension    Past Medical History:  Past Medical History  Diagnosis Date  . Seizures     r/t HTN in 1990's x 1  . Brain aneurysm     No records could be found  . Blood transfusion 1990's    r/t Kidney removal surgery  . Arthritis     Back  . Anemia     anemia of chronic disease likely 2/2 ESRD per last anemia panel (02/2011) with Fe 35, TIBC 167, ferritin 2041  // BL Hgb 8-10  . Umbilical hernia age 11  . Heart murmur     Born with heart mumur, does not require follow up per pt  . Hypertension     Does not see a heart doctor, had pre transplant stress test at [facility]    . End stage renal disease on dialysis     Secondary to hypertension // T/Th/Sat dialysis on [employer]  . Hepatitis C   . Colitis, ischemic 01/2011    S/P partial colectomy of right hemicolon and ileostomy placement  . Lumbar spinal stenosis      bilateral L4-5 and L5-S1 posterior lumbar interbody arthrodesis and bilateral L4-S1 posterior lateral arthrodesis for lumbar stenosis dynamic lumbar spondylolisthesis lumbar spondylosis lumbar radiculopathy with foot drop // s/p surgery 01/2011 - see surgery section for details.  Marland Kitchen Dysrhythmia     SVT for brief period in Feb 2013  . Dialysis patient     [place]  . Diabetes  mellitus     Borderline  . Foot drop, bilateral     after back surgery  . Anuria     Due to dialysis  . Aortic stenosis, mild     03/2011 echo   Past Surgical History:  Past Surgical History  Procedure Date  . Nephrectomy 2010    right side done at [facility] on transplant list  . Umbilical hernia repair age 28- 49  . Appendectomy 1960's  . Vascular surgery     right arm dialysis graft  . Tonsillectomy     as a child  . Rib resection     d/t kidney removal  . Back surgery 01/30/2011    L4-S1 lumbar laminectomy facetectomy and foraminotomies for decompression a bilateral L4-5 and L5-S1 posterior lumbar interbody arthrodesis and bilateral L4-S1 posterior lateral arthrodesis for lumbar stenosis dynamic lumbar spondylolisthesis lumbar spondylosis lumbar radiculopathy with foot drop  . Laparotomy 02/09/2011    Procedure: EXPLORATORY LAPAROTOMY;  Surgeon: Harl Bowie, MD;  Location: [place];  Service: General;  Laterality: N/A;  . Partial colectomy 02/09/2011    Reason for surgery: ischemic colitis of right hemicolon; Surgeon: Harl Bowie, MD;  Location: [place];  Service: General;  Laterality: Right;  . Colostomy 02/09/2011    Reason for surgery: ischemic colitis of right hemicolon  requiring partial colectomy and ileostomy.  Surgeon: Harl Bowie, MD;  Location: [place];  Service: General;  Laterality: Right;  . Colonoscopy 02/19/2011    Procedure: COLONOSCOPY;  Surgeon: Missy Sabins, MD;  Location: [place];  Service: Endoscopy;  Laterality: N/A;  . Colonoscopy 03/07/2011    Procedure: COLONOSCOPY;  Surgeon: Cleotis Nipper, MD;  Location: [facility] ENDOSCOPY;  Service: Endoscopy;  Laterality: N/A;  no prep needed--pt has long Hartmann's pouch and ileostomy  . Total parathyroidectomy 01/17/2001    with autotransplantation into left forearm // due to secondary hyperparathyroidism from ESRD  . Av fistula placement 04/08/2011    Procedure: INSERTION OF ARTERIOVENOUS (AV) GORE-TEX  GRAFT ARM;  Surgeon: Rosetta Posner, MD;  Location: [facility] OR;  Service: Vascular;  Laterality: Right;  Revision of Upper Arm Gore-Tex Graft  . Ileostomy closure 05/29/2011    Procedure: ILEOSTOMY TAKEDOWN;  Surgeon: Harl Bowie, MD;  Location: [place];  Service: General;  Laterality: N/A;    PT Assessment/Plan/Recommendation PT Assessment Clinical Impression Statement: Pt is a a 61 y/o female who was receiving PT at home for residual deficits from back surgery. Pt wears AFOs on bilateral feet due to footdrop.  Unable to assess gait today due to AFOs not available.  PT reports recently starting to amubulate with HHPT.  Pt appears to be functioning at her baseline level. PT Recommendation/Assessment: All further PT needs can be met in the next venue of care PT Problem List: Decreased strength;Decreased mobility Barriers to Discharge: None PT Therapy Diagnosis : Generalized weakness PT Recommendation Follow Up Recommendations: Home health PT Equipment Recommended: None recommended by PT PT Goals     PT Evaluation Precautions/Restrictions  Precautions Precautions: Fall Required Braces or Orthoses: Yes Other Brace/Splint: Bilateral AFOs for foot drop Restrictions Weight Bearing Restrictions: No Prior Functioning  Home Living Lives With: Daughter Receives Help From: Family Type of Home: House Home Layout: One level Home Access: Ramped entrance Bathroom Shower/Tub: [place] unit;Curtain Bathroom Toilet: Standard Bathroom Accessibility: Yes How Accessible: Accessible via wheelchair;Accessible via walker Home Adaptive Equipment: [place];Wheelchair - manual Prior Function Level of Independence: Requires assistive device for independence;Needs assistance with homemaking;Independent with transfers;Independent with basic ADLs Able to Take Stairs?: No Driving: No Vocation: Retired Leisure: Hobbies-no Cognition Cognition Arousal/Alertness: Awake/alert Overall Cognitive  Status: Appears within functional limits for tasks assessed Orientation Level: Oriented X4 Sensation/Coordination Sensation Proprioception: Appears Intact Extremity Assessment RUE Assessment RUE Assessment: Within Functional Limits LUE Assessment LUE Assessment: Within Functional Limits RLE Assessment RLE Assessment: Exceptions to [facility] RLE Strength Right Ankle Dorsiflexion: 0/5 LLE Assessment LLE Assessment: Exceptions to [facility] LLE Strength Left Hip Flexion: 2/5 Left Ankle Dorsiflexion: 0/5 Mobility (including Balance) Bed Mobility Bed Mobility: Yes Supine to Sit: 7: Independent;HOB flat Sit to Supine: 6: Modified independent (Device/Increase time);HOB flat (pt lifting left LE with hands) Transfers Transfers: Yes Sit to Stand: Not tested (comment) Stand to Sit: Not tested (comment) Stand Pivot Transfers: Not tested (comment) Squat Pivot Transfers: 7: Independent;With upper extremity assistance Ambulation/Gait Ambulation/Gait: No  Posture/Postural Control Posture/Postural Control: No significant limitations Balance Balance Assessed: No Exercise    End of Session General Behavior During Session: [facility] for tasks performed Cognition: [facility] for tasks performed  Colin Ellers 06/26/2011, 5:14 PM Amaya Blakeman L. Jaidyn Usery DPT [phone]

## 2011-06-26 NOTE — H&P (Signed)
IM Attending  64 woman on dialysis for decades and very complex series of surgical issues last year, adm now for watery diarrhea.  Has ischemic colitis and hemi-colectomy in 2013 and just had iteostomy take-down 3 weeks ago.  Formed BMs for > 1 week after surgery, then profuse watery diarrhea for several days w/o pain, fever, blood, nausea.  Seems to have slowed so far this AM. V.S. OK and labs not impressive except hemoconcentration from 8 to 13!  She does not seem nearly sick enough to match that change. Have reviewed all data and discussed with residents.

## 2011-06-26 NOTE — Consult Note (Signed)
[facility] Renal Consultation Note  Indication for Consultation:  Management of ESRD/hemodialysis; anemia, hypertension/volume and secondary hyperparathyroidism  HPI: [name] is a 61 y.o. female with ESRD on HD at the [facility] who presented to the ED early this morning with watery diarrhea for the last five days.  She states that she has an episode everytime she drinks or eats and that it prevented her from coming to her scheduled dialysis yesterday.  She denies any fever, chills, nausea, vomiting, or abdominal pain.  She is s/p right hemicolectomy and ileostomy, secondary to ischemic colitis, in 02/2011 and had ileostomy takedown on 05/28/11.  Her staples were removed last week, and she reports no problems with bowel movements until five days ago.     Dialysis Orders: Center: [facility] on TTS . EDW 49 kg   HD Bath 2K/2.25Ca  Time 3.5 hrs  Heparin 1500 U. Access AVG @ RUA   BFR 400 DFR 800    Zemplar 0 mcg IV/HD   Epogen 28,000 Units IV/HD  Venofer  0   Past Medical History  Diagnosis Date  . Seizures     r/t HTN in 1990's x 1  . Brain aneurysm     No records could be found  . Blood transfusion 1990's    r/t Kidney removal surgery  . Arthritis     Back  . Anemia     anemia of chronic disease likely 2/2 ESRD per last anemia panel (02/2011) with Fe 35, TIBC 167, ferritin 2041  // BL Hgb 8-10  . Umbilical hernia age 7  . Heart murmur     Born with heart mumur, does not require follow up per pt  . Hypertension     Does not see a heart doctor, had pre transplant stress test at [facility]    . End stage renal disease on dialysis     Secondary to hypertension // T/Th/Sat dialysis on [employer]  . Hepatitis C   . Colitis, ischemic 01/2011    S/P partial colectomy of right hemicolon and ileostomy placement  . Lumbar spinal stenosis      bilateral L4-5 and L5-S1 posterior lumbar interbody arthrodesis and bilateral L4-S1 posterior lateral arthrodesis for lumbar stenosis  dynamic lumbar spondylolisthesis lumbar spondylosis lumbar radiculopathy with foot drop // s/p surgery 01/2011 - see surgery section for details.  Marland Kitchen Dysrhythmia     SVT for brief period in Feb 2013  . Dialysis patient     [place]  . Diabetes mellitus     Borderline  . Foot drop, bilateral     after back surgery  . Anuria     Due to dialysis  . Aortic stenosis, mild     03/2011 echo   Past Surgical History  Procedure Date  . Nephrectomy 2010    right side done at [facility] on transplant list  . Umbilical hernia repair age 68- 44  . Appendectomy 1960's  . Vascular surgery     right arm dialysis graft  . Tonsillectomy     as a child  . Rib resection     d/t kidney removal  . Back surgery 01/30/2011    L4-S1 lumbar laminectomy facetectomy and foraminotomies for decompression a bilateral L4-5 and L5-S1 posterior lumbar interbody arthrodesis and bilateral L4-S1 posterior lateral arthrodesis for lumbar stenosis dynamic lumbar spondylolisthesis lumbar spondylosis lumbar radiculopathy with foot drop  . Laparotomy 02/09/2011    Procedure: EXPLORATORY LAPAROTOMY;  Surgeon: Harl Bowie, MD;  Location: [place] OR;  Service: General;  Laterality: N/A;  . Partial colectomy 02/09/2011    Reason for surgery: ischemic colitis of right hemicolon; Surgeon: Harl Bowie, MD;  Location: [place];  Service: General;  Laterality: Right;  . Colostomy 02/09/2011    Reason for surgery: ischemic colitis of right hemicolon requiring partial colectomy and ileostomy.  Surgeon: Harl Bowie, MD;  Location: [place];  Service: General;  Laterality: Right;  . Colonoscopy 02/19/2011    Procedure: COLONOSCOPY;  Surgeon: Missy Sabins, MD;  Location: [place];  Service: Endoscopy;  Laterality: N/A;  . Colonoscopy 03/07/2011    Procedure: COLONOSCOPY;  Surgeon: Cleotis Nipper, MD;  Location: [facility] END[facility]COPY;  Service: Endoscopy;  Laterality: N/A;  no prep needed--pt has long  Hartmann's pouch and ileostomy  . Total parathyroidectomy 01/17/2001    with autotransplantation into left forearm // due to secondary hyperparathyroidism from ESRD  . Av fistula placement 04/08/2011    Procedure: INSERTION OF ARTERIOVENOUS (AV) GORE-TEX GRAFT ARM;  Surgeon: Rosetta Posner, MD;  Location: [facility] OR;  Service: Vascular;  Laterality: Right;  Revision of Upper Arm Gore-Tex Graft  . Ileostomy closure 05/29/2011    Procedure: ILEOSTOMY TAKEDOWN;  Surgeon: Harl Bowie, MD;  Location: [facility] OR;  Service: General;  Laterality: N/A;   Family History  Problem Relation Age of Onset  . Anesthesia problems Neg Hx   . Hypotension Neg Hx   . Malignant hyperthermia Neg Hx   . Pseudochol deficiency Neg Hx   . Diabetes Father   . Stroke Mother    Social History The  reports that she quit smoking about two years ago after a 24 pack-year smoking history. She reports that she no longer drinks alcohol and never used illicit drugs.  She is separated and has two daughters.  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  Allergies  Allergen Reactions  . Contrast Media (Iodinated Diagnostic Agents)     Seziure  . Iohexol Other (See Comments)    Reaction is convulsions   Prior to Admission medications   Medication Sig Start Date End Date Taking? Authorizing Provider  B Complex-C-Zn-Folic Acid (DIALYVITE Q000111Q WITH ZINC) 0.8 MG TABS Take 1 tablet by mouth daily. 06/20/11  Yes Ivor Costa, MD  darbepoetin (ARANESP) 200 MCG/0.4ML SOLN Inject 200 mcg into the skin every 7 (seven) days. Administered at dialysis per nephrology   Yes Historical Provider, MD  gabapentin (NEURONTIN) 300 MG capsule Take 600 mg by mouth every morning.  04/10/11  Yes Lou Cal, MD  metoprolol succinate (TOPROL-XL) 12.5 mg TB24 Take 12.5 mg by mouth daily. 04/23/11  Yes Minus Breeding, MD  multivitamin (RENA-VIT) TABS tablet Take 1 tablet by mouth daily. 03/20/11  Yes Daniel J Angiulli, PA  pantoprazole (PROTONIX)  40 MG tablet Take 40 mg by mouth daily at 12 noon. 06/20/11 06/19/12 Yes Ivor Costa, MD   Labs:  Results for orders placed during the hospital encounter of 06/25/11 (from the past 48 hour(s))  CBC     Status: Abnormal   Collection Time   06/25/11  6:45 PM      Component Value Range Comment   WBC 4.9  4.0 - 10.5 (K/uL)    RBC 5.07  3.87 - 5.11 (MIL/uL)    Hemoglobin 13.3  12.0 - 15.0 (g/dL)    HCT 43.6  36.0 - 46.0 (%)    MCV 86.0  78.0 - 100.0 (fL)    MCH 26.2  26.0 - 34.0 (pg)    MCHC 30.5  30.0 - 36.0 (g/dL)    RDW 18.9 (*) 11.5 - 15.5 (%)    Platelets 232  150 - 400 (K/uL)   DIFFERENTIAL     Status: Abnormal   Collection Time   06/25/11  6:45 PM      Component Value Range Comment   Neutrophils Relative 32 (*) 43 - 77 (%)    Neutro Abs 1.6 (*) 1.7 - 7.7 (K/uL)    Lymphocytes Relative 40  12 - 46 (%)    Lymphs Abs 2.0  0.7 - 4.0 (K/uL)    Monocytes Relative 19 (*) 3 - 12 (%)    Monocytes Absolute 0.9  0.1 - 1.0 (K/uL)    Eosinophils Relative 8 (*) 0 - 5 (%)    Eosinophils Absolute 0.4  0.0 - 0.7 (K/uL)    Basophils Relative 2 (*) 0 - 1 (%)    Basophils Absolute 0.1  0.0 - 0.1 (K/uL)   COMPREHENSIVE METABOLIC PANEL     Status: Abnormal   Collection Time   06/25/11  6:45 PM      Component Value Range Comment   Sodium 138  135 - 145 (mEq/L)    Potassium 4.0  3.5 - 5.1 (mEq/L)    Chloride 102  96 - 112 (mEq/L)    CO2 18 (*) 19 - 32 (mEq/L)    Glucose, Bld 93  70 - 99 (mg/dL)    BUN 30 (*) 6 - 23 (mg/dL)    Creatinine, Ser 10.09 (*) 0.50 - 1.10 (mg/dL)    Calcium 10.4  8.4 - 10.5 (mg/dL)    Total Protein 8.4 (*) 6.0 - 8.3 (g/dL)    Albumin 3.5  3.5 - 5.2 (g/dL)    AST 21  0 - 37 (U/L)    ALT 12  0 - 35 (U/L)    Alkaline Phosphatase 112  39 - 117 (U/L)    Total Bilirubin 0.3  0.3 - 1.2 (mg/dL)    GFR calc non Af Amer 4 (*) >90 (mL/min)    GFR calc Af Amer 4 (*) >90 (mL/min)   PHOSPHORUS     Status: Abnormal   Collection Time   06/25/11  6:45 PM      Component Value Range Comment     Phosphorus 4.7 (*) 2.3 - 4.6 (mg/dL)   CLOSTRIDIUM DIFFICILE BY PCR     Status: Normal   Collection Time   06/26/11  2:30 AM      Component Value Range Comment   C difficile by pcr NEGATIVE  NEGATIVE    CBC     Status: Abnormal   Collection Time   06/26/11  5:00 AM      Component Value Range Comment   WBC 5.2  4.0 - 10.5 (K/uL)    RBC 4.73  3.87 - 5.11 (MIL/uL)    Hemoglobin 12.4  12.0 - 15.0 (g/dL)    HCT 40.8  36.0 - 46.0 (%)    MCV 86.3  78.0 - 100.0 (fL)    MCH 26.2  26.0 - 34.0 (pg)    MCHC 30.4  30.0 - 36.0 (g/dL)    RDW 18.6 (*) 11.5 - 15.5 (%)    Platelets 224  150 - 400 (K/uL)   RENAL FUNCTION PANEL     Status: Abnormal   Collection Time   06/26/11  5:00 AM      Component Value Range Comment   Sodium 139  135 - 145 (mEq/L)    Potassium 4.3  3.5 - 5.1 (mEq/L)    Chloride 106  96 - 112 (mEq/L)    CO2 11 (*) 19 - 32 (mEq/L)    Glucose, Bld 53 (*) 70 - 99 (mg/dL)    BUN 34 (*) 6 - 23 (mg/dL)    Creatinine, Ser 10.55 (*) 0.50 - 1.10 (mg/dL)    Calcium 9.4  8.4 - 10.5 (mg/dL)    Phosphorus 4.6  2.3 - 4.6 (mg/dL)    Albumin 3.0 (*) 3.5 - 5.2 (g/dL)    GFR calc non Af Amer 3 (*) >90 (mL/min)    GFR calc Af Amer 4 (*) >90 (mL/min)    Constitutional: negative for chills, fatigue, fevers and sweats Ears, nose, mouth, throat, and face: negative for hearing loss, hoarseness, nasal congestion and sore throat Respiratory: negative for cough, dyspnea on exertion and wheezing Cardiovascular: negative for chest pain, chest pressure/discomfort, fatigue and orthopnea Gastrointestinal: positive for watery diarrhea Genitourinary:anuric Musculoskeletal:negative for arthralgias, back pain, muscle weakness and myalgias Neurological: negative for coordination problems, dizziness, headaches and weakness  Physical Exam: Filed Vitals:   06/26/11 0950  BP: 94/66  Pulse: 71  Temp: 98 F (36.7 C)  Resp: 18     General appearance: alert, cooperative and no distress Head: Normocephalic,  without obvious abnormality, atraumatic Neck: no adenopathy, no carotid bruit, no JVD and supple, symmetrical, trachea midline Resp: clear to auscultation bilaterally Cardio: regular rate and rhythm and systolic murmur 2/6 GI: soft, non-tender; bowel sounds normal; no masses,  no organomegaly; Steri-strips over intact mid abdominal incision Extremities: extremities normal, atraumatic, no cyanosis or edema Neurologic: Grossly normal Dialysis Access: AVG @ RUA with + bruit   Assessment/Plan: 1.  Diarrhea - unknown etiology, C. Diff negative; likely functional rather than infectious process; s/p R hemicolectomy (secondary to ischemic colitis) in 02/2011 and ileostomy takedown on 05/28/11. 2.  ESRD - on HD on TTS, K stable at 4.3, last HD on 4/6.   3.  Hypertension/volume  - BP 94/66 likely secondary to diarrhea, on Toprol XL 12.5 mg qd; wt 44.8 kg today (EDW 49 kg). 4.  Anemia - Hgb 12.4, on max Epogen as outpatient. 5.  Metabolic bone disease - Ca 9.4, last P 2.7 on 3/21, s/p parathyroidectomy in '02. 6.  Nutrition - Last alb down to 2.9 on 3/21 (previously above 4).  [name],CHARLES 06/26/2011, 1:10 PM   Attending Nephrologist: Roney Jaffe, MD  Patient seen and examined and agree with assessment and plan as above.  Kelly Splinter  MD [employer] [phone] pgr    [phone] cell 06/26/2011, 6:06 PM

## 2011-06-26 NOTE — ED Notes (Signed)
OPC at bedside.  Pt stated she had to have a bm and hr increased to 172 on pulse ox monitor.  EKG ordered.  Pt quickly returned to hr of 90's.  Per MD, pt has hx of svt.

## 2011-06-26 NOTE — Progress Notes (Signed)
Received request for [facility] needs from RN, however unclear at this time exactly what needs may be, per chart pt lives with daughter and goes to outpatient hemodialysis 3x per week.  Will need PT/OT eval to establish need for these services at home, no skill needs noted to justify [place]. CM will continue to follow any needs.  Jasmine Pang RN MPH Case Manager [phone]

## 2011-06-26 NOTE — H&P (Signed)
Hospital Admission Note Date: 06/26/2011  Patient name: [name] Medical record number: [id number] Date of birth: [birth year] Age: 61 y.o. Gender: female PCP: Ivor Costa, MD, MD  Medical Service:          Internal Medicine Teaching Service    Attending physician:  Dr.  Gloriann Loan  1st Contact:  Dr. Elnora Morrison      Pager: [phone] 2nd Contact:  Dr. Ezequiel Kayser Pager: [phone] After 5 pm or weekends: 1st Contact:      Pager: [phone] 2nd Contact:      Pager: [phone]  Chief Complaint:  Chief Complaint  Patient presents with  . Diarrhea     History of Present Illness: [name] is a 61 y.o.female with past medical history significant for ESRD on HD, and ileostomy reversal one month ago who presents with diarrhea for one week.    [name] states that she has had diarrhea for about a week. Denied abdominal pain or vomiting.  Daughter called EMS as diarrhea had not abated and she was brought to the ED.  She states she had diarrhea everytime she ate or drank anything. She states the diarrhea is Brown/green liquid. There is no blood in her diarrhea. She feels slighly weak. Denies N/V, and F/C. Has not taken any antibiotics recently. Had ileostomy takedown in March 13, and  Had her staples removed last week.   No sick contacts. No diet changes or travel. Drinks city water.  Of note, patient underwent elective spinal surgery for L4-S1 laminectomy on 01/30/2011. In the postsurgical period she developed ischemic colitis on approximately November 24. She then underwent right hemicolectomy with ileal stoma placement. As she was recovering from hemicolectomy it was found that she had bilateral foot drop, and now requires a wheelchair. On November 29 she developed an abdominal abscess and required percutaneous CT drainage. At that time she was treated with vancomycin and Zosyn and ciprofloxacin.. On December 28 she developed a lump left upper extremity DVT. She was not just  anticoagulated at that time. In January 2013 she was admitted with altered mental status, and was found to have sinus tachycardia of unclear etiology. She underwent workup by the cardiology service with no cause found. On January 21 she had an echocardiogram that showed ejection fraction of 65% with grade 1 diastolic dysfunction. She has moderate aortic stenosis with total valve area 1.81 cm. TSH was normal at that time and she was started on beta blocker. Since January she has developed hypotension to the 80s over 50s also with unclear cause.    Review of Systems:  Bold responses are positives Constitutional: Denies fever, chills, diaphoresis, appetite change and fatigue.  HEENT: Denies blurry vision or double vision, congestion, rhinorrhea, sneezing, mouth sores, trouble swallowing,  Respiratory: Denies SOB, DOE, cough, chest tightness,  and wheezing.   Cardiovascular: Denies chest pain, palpitations and leg swelling.  Gastrointestinal: Denies nausea, vomiting, abdominal pain, diarrhea, constipation, blood in stool and abdominal distention. Denies incontenance Genitourinary: Denies dysuria, urgency, frequency, hematuria, flank pain and difficulty urinating.  Musculoskeletal: Denies myalgias, back pain, joint swelling, arthralgias gait problem foot drop post surgery.  Skin: Denies pallor, rash and wound.  Neurological: Denies dizziness,  weakness, light-headedness, numbness and headaches.  Hematological: Denies adenopathy.  Psychiatric/Behavioral: Denies mood changes, or sleep disturbance  Past Medical History  Diagnosis Date  . Seizures     r/t HTN in 1990's x 1  . Brain aneurysm     No records could  be found  . Blood transfusion 1990's    r/t Kidney removal surgery  . Arthritis     Back  . Anemia     anemia of chronic disease likely 2/2 ESRD per last anemia panel (02/2011) with Fe 35, TIBC 167, ferritin 2041  // BL Hgb 8-10  . Umbilical hernia age 52  . Heart murmur     Born with  heart mumur, does not require follow up per pt  . Hypertension     Does not see a heart doctor, had pre transplant stress test at [facility]    . End stage renal disease on dialysis     Secondary to hypertension // T/Th/Sat dialysis on [employer]  . Hepatitis C   . Colitis, ischemic 01/2011    S/P partial colectomy of right hemicolon and ileostomy placement  . Lumbar spinal stenosis      bilateral L4-5 and L5-S1 posterior lumbar interbody arthrodesis and bilateral L4-S1 posterior lateral arthrodesis for lumbar stenosis dynamic lumbar spondylolisthesis lumbar spondylosis lumbar radiculopathy with foot drop // s/p surgery 01/2011 - see surgery section for details.  Marland Kitchen Dysrhythmia     SVT for brief period in Feb 2013  . Dialysis patient     [place]  . Diabetes mellitus     Borderline  . Foot drop, bilateral     after back surgery  . Anuria     Due to dialysis  . Aortic stenosis, mild     03/2011 echo    Past Surgical History  Procedure Date  . Nephrectomy 2010    right side done at [facility] on transplant list  . Umbilical hernia repair age 89- 25  . Appendectomy 1960's  . Vascular surgery     right arm dialysis graft  . Tonsillectomy     as a child  . Rib resection     d/t kidney removal  . Back surgery 01/30/2011    L4-S1 lumbar laminectomy facetectomy and foraminotomies for decompression a bilateral L4-5 and L5-S1 posterior lumbar interbody arthrodesis and bilateral L4-S1 posterior lateral arthrodesis for lumbar stenosis dynamic lumbar spondylolisthesis lumbar spondylosis lumbar radiculopathy with foot drop  . Laparotomy 02/09/2011    Procedure: EXPLORATORY LAPAROTOMY;  Surgeon: Harl Bowie, MD;  Location: [place];  Service: General;  Laterality: N/A;  . Partial colectomy 02/09/2011    Reason for surgery: ischemic colitis of right hemicolon; Surgeon: Harl Bowie, MD;  Location: [place];  Service: General;  Laterality: Right;  .  Colostomy 02/09/2011    Reason for surgery: ischemic colitis of right hemicolon requiring partial colectomy and ileostomy.  Surgeon: Harl Bowie, MD;  Location: [place];  Service: General;  Laterality: Right;  . Colonoscopy 02/19/2011    Procedure: COLONOSCOPY;  Surgeon: Missy Sabins, MD;  Location: [place];  Service: Endoscopy;  Laterality: N/A;  . Colonoscopy 03/07/2011    Procedure: COLONOSCOPY;  Surgeon: Cleotis Nipper, MD;  Location: [facility] ENDOSCOPY;  Service: Endoscopy;  Laterality: N/A;  no prep needed--pt has long Hartmann's pouch and ileostomy  . Total parathyroidectomy 01/17/2001    with autotransplantation into left forearm // due to secondary hyperparathyroidism from ESRD  . Av fistula placement 04/08/2011    Procedure: INSERTION OF ARTERIOVENOUS (AV) GORE-TEX GRAFT ARM;  Surgeon: Rosetta Posner, MD;  Location: [facility] OR;  Service: Vascular;  Laterality: Right;  Revision of Upper Arm Gore-Tex Graft  . Ileostomy closure 05/29/2011    Procedure: ILEOSTOMY TAKEDOWN;  Surgeon: Nathaneil Canary  Lynann Beaver, MD;  Location: [place];  Service: General;  Laterality: N/A;    Meds: Medications Prior to Admission  Medication Dose Route Frequency Provider Last Rate Last Dose  . sodium chloride 0.9 % bolus 500 mL  500 mL Intravenous STAT Janice Norrie, MD   500 mL at 06/25/11 2058  . sodium chloride 0.9 % bolus 500 mL  500 mL Intravenous STAT Janice Norrie, MD   500 mL at 06/25/11 2230   Medications Prior to Admission  Medication Sig Dispense Refill  . B Complex-C-Zn-Folic Acid (DIALYVITE Q000111Q WITH ZINC) 0.8 MG TABS Take 1 tablet by mouth daily.      . darbepoetin (ARANESP) 200 MCG/0.4ML SOLN Inject 200 mcg into the skin every 7 (seven) days. Administered at dialysis per nephrology      . gabapentin (NEURONTIN) 300 MG capsule Take 600 mg by mouth every morning.       . metoprolol succinate (TOPROL-XL) 12.5 mg TB24 Take 12.5 mg by mouth daily.      . multivitamin (RENA-VIT) TABS tablet Take 1 tablet by mouth  daily.      . pantoprazole (PROTONIX) 40 MG tablet Take 40 mg by mouth daily at 12 noon.        Allergies: Contrast media and Iohexol convulsions   Family History  Problem Relation Age of Onset  . Anesthesia problems Neg Hx   . Hypotension Neg Hx   . Malignant hyperthermia Neg Hx   . Pseudochol deficiency Neg Hx   . Diabetes Father   . Stroke Mother     History   Social History  . Marital Status: Legally Separated    Spouse Name: N/A    Number of Children: 2  . Years of Education: 10th grade   Occupational History  . Retired     previously worked as a Producer, television/film/video History Main Topics  . Smoking status: Former Smoker -- 0.5 packs/day for 48 years    Types: Cigarettes    Quit date: 08/17/2010  . Smokeless tobacco: Never Used  . Alcohol Use: No     1 pint on Fridays  . Drug Use: No  . Sexually Active: No   Other Topics Concern  . Not on file   Social History Narrative   Lives in [place] with daughter. Has 2 On HD since for 20 years. Worked as a Sports coach. Went to 10th grade. Can read and write.      Physical Exam: Blood pressure 108/74, pulse 91, temperature 98.2 F (36.8 C), temperature source Oral, resp. rate 12, SpO2 96.00%. Gen: Well-developed, frail appearing,  elderly woman in no acute distress; alert, appropriate and cooperative throughout examination. Head: Normocephalic, atraumatic. Eyes: PERRL, EOMI, No signs of anemia or jaundince. Nose: Mucous membranes moist, not inflammed, nonerythematous. Throat: Oropharynx nonerythematous, no exudate appreciated.  Neck: Supple with no deformities, masses, or tenderness noted.  No carotid Bruits, no JVD. Diminished carotid pulse. Lungs: Normal respiratory effort. Clear to auscultation BL, without crackles or wheezes. Heart: RRR alternating with sinus tach to the 140s.  2/6 systolic murmur best heard at LUSB.  Abdomen: BS normoactive. Soft, nondistended, non-tender. No masses or organomegaly. Extremities: No  pretibial edema. Decreased peripheral pulses throughout. AV fistula in RUE.  Neurologic: A&O X3, CN II - XII are grossly intact. Motor strength is 5/5 in the all 4 extremities with the exception of 4/5 strength in the bilateral feet, Sensations intact to light touch, but decreased in the toes. No  focal neurologic deficit Skin: No Rashes. Post surgical scars on the abdomen and lower spine. No bedsores.  Psych: mood and affect are normal.    Lab results: Basic Metabolic Panel:  [name] 06/25/11 1845  NA 138  K 4.0  CL 102  CO2 18*  GLUCOSE 93  BUN 30*  CREATININE 10.09*  CALCIUM 10.4  MG --  PHOS 4.7*   Liver Function Tests:  Basename 06/25/11 1845  AST 21  ALT 12  ALKPHOS 112  BILITOT 0.3  PROT 8.4*  ALBUMIN 3.5   CBC:  Basename 06/25/11 1845  WBC 4.9  NEUTROABS 1.6*  HGB 13.3  HCT 43.6  MCV 86.0  PLT 232   Other results: ED ECG REPORT   Date: 06/26/2011  EKG Time: 4:14 AM  Rate: 89  Rhythm: normal sinus rhythm,    Axis: normal  Intervals:first-degree A-V block borderline  ST&T Change: no STEMI or t waf abnormalities  Narrative Interpretation: borderline p-r prolongation, otherwise normal EKG       Assessment & Plan by Problem: Principal Problem:  *Diarrhea - Overall patient is hemodynamically stable, but with her hypotension, tachycardia and other complex medical problems she warrants admission. Most likely due to Cdiff colitis given frequent healthcare exposures, but other sources of gastroenteritis (viral/bacterial) are possible.  Also, could potentially be complication from prior ileostomy takedown done on May 29, 2011. As she is stable for now will hold off abdominal CT.  Will treat supportively and obtain cdiff PCR. With last dialysis being Sat and patient already getting 1 L in the ED, will not hydrate further. -- admit to tele -- contact precautions -- c diff PCR -- follow stool cultures taken in the ED -- contact Dr. Ninfa Linden to get his input.    -- if spikes fevers or clinically decompensates will get blood cultures, get Abd CT and start antibiotics   Active Problems: ESRD on HD - patient did not get HD yesterday.  Electrolytes and BUN WNL for now. Likely will need to get HD during the day tomorrow. -- contact nephrology in AM for HD -- Renal function panel in the morning -- Renal diet -- Continue dialyvite  -- Continued darbepoetin   Anemia of chronic kidney failure - baseline hemoglobin approximately 8-10. Her hemoglobin of 13 likely represents hemoconcentration. We'll expect return to baseline with hemodialysis. -- Morning CBC   Sinus tachycardia -  Intermittant episodes of nonsustained V. tach, again observed in the ED. Patient is hemodynamically stable and asymptomatic. Was evaluated by Dr. Percival Spanish this past February where he noted the patient is still having episodes of sinus tachycardia. His opinion was that no further change in therapy or evaluation is indicated at that time. -- Telemetry -- Will obtain EKG if telemetry alerts to sinus tach -- Continue beta blocker (held in the morning with dialysis days)   Aortic stenosis - moderate at this time. Patient is asymptomatic. Total valve area 1.81 cm. Will continue to monitor -- Hochrein will followup with echo in one year   Hypotension - cardiology initiated metoprolol for SVT when blood pressures were in the 80s over 40s. Given this we'll continue metoprolol unless patient becomes symptomatic -- Metoprolol XL 12.5 mg  GERD, stable will continue home therapy -- Pantoprazole  DVT prophylaxis - Lovenox   Signed: Dannielle Baskins 06/26/2011, 4:14 AM

## 2011-06-26 NOTE — ED Notes (Signed)
Attempted to call report.  RN Antony Madura unavailable.

## 2011-06-26 NOTE — Progress Notes (Signed)
Subjective: The patient was admitted overnight with a 5-day history of diarrhea.  Only 1 stool so far this morning.  No abd pain, nausea, or vomiting.  Objective: Vital signs in last 24 hours: Filed Vitals:   06/26/11 0353 06/26/11 0356 06/26/11 0443 06/26/11 0950  BP: 103/67 108/74 110/73 94/66  Pulse: 87 91 87 71  Temp:   98.7 F (37.1 C) 98 F (36.7 C)  TempSrc:   Oral Oral  Resp: 12 12 16 18   Height:   5\' 5"  (1.651 m)   Weight:   98 lb 11.2 oz (44.77 kg)   SpO2:   96% 98%   Weight change:   Intake/Output Summary (Last 24 hours) at 06/26/11 1145 Last data filed at 06/26/11 0900  Gross per 24 hour  Intake    240 ml  Output      0 ml  Net    240 ml   Physical Exam: General: alert, cooperative, and in no apparent distress HEENT: pupils equal round and reactive to light, vision grossly intact, oropharynx clear and non-erythematous  Neck: supple, no lymphadenopathy Lungs: clear to ascultation bilaterally, normal work of respiration, no wheezes, rales, ronchi Heart: regular rate and rhythm, grade 2/6 systolic murmur best heard at LUSB Abdomen: soft, non-tender, non-distended, normal bowel sounds, area of staple removal clean/dry/intact Extremities: no cyanosis, clubbing, or edema Neurologic: alert & oriented X3, cranial nerves II-XII intact, strength grossly intact, sensation intact to light touch  Lab Results: Basic Metabolic Panel:  Lab 99991111 0500 06/25/11 1845  NA 139 138  K 4.3 4.0  CL 106 102  CO2 11* 18*  GLUCOSE 53* 93  BUN 34* 30*  CREATININE 10.55* 10.09*  CALCIUM 9.4 10.4  MG -- --  PHOS 4.6 4.7*   Liver Function Tests:  Lab 06/26/11 0500 06/25/11 1845  AST -- 21  ALT -- 12  ALKPHOS -- 112  BILITOT -- 0.3  PROT -- 8.4*  ALBUMIN 3.0* 3.5   CBC:  Lab 06/26/11 0500 06/25/11 1845  WBC 5.2 4.9  NEUTROABS -- 1.6*  HGB 12.4 13.3  HCT 40.8 43.6  MCV 86.3 86.0  PLT 224 232    Micro Results: Recent Results (from the past 240 hour(s))    CLOSTRIDIUM DIFFICILE BY PCR     Status: Normal   Collection Time   06/26/11  2:30 AM      Component Value Range Status Comment   C difficile by pcr NEGATIVE  NEGATIVE  Final    Studies/Results: No results found.  Medications: I have reviewed the patient's current medications. Scheduled Meds:   . enoxaparin  30 mg Subcutaneous Daily  . gabapentin  600 mg Oral Daily  . metoprolol succinate  12.5 mg Oral Daily  . multivitamin  1 tablet Oral QHS  . pantoprazole  40 mg Oral Q1200  . sodium chloride  500 mL Intravenous STAT  . sodium chloride  500 mL Intravenous STAT  . sodium chloride  3 mL Intravenous Q12H  . DISCONTD: darbepoetin  200 mcg Subcutaneous Q7 days  . DISCONTD: DIALYVITE 800 WITH ZINC  1 tablet Oral Daily   Continuous Infusions:  PRN Meds:.acetaminophen, acetaminophen  Assessment/Plan: The patient is a 61 yo woman, history of ESRD, history of ischemic colitis s/p ileostomy reversal 1 month ago, presenting with diarrhea.  # Diarrhea - the patient notes a 5-day history of 7-8 loose Myrian Botello stools, with no abd pain, nausea, or vomiting.  The patient is s/p ileostomy reversal 1 month ago,  but had normal bowel movements following this procedure.  Patient presented for abdominal staple removal the day before symptoms began.  Differential includes viral gastroenteritis (given contact with healthcare system on the day prior to symptom onset) vs new "normal" stool consistency s/p R hemicolectomy and removal of cecal valve (less likely given normal BM's after takedown).  No evidence of leukocytosis or abd pain to suggest abdominal abscess.  C diff PCR negative. -c diff PCR neg -stool cultures taken in ED, f/u results -if fever or leukocytosis, will get abd CT and blood cultures -informed Dr. Ninfa Linden about patient's admission, appreciate phone advice  # ESRD - on Tu/Th/Sa HD, did not have HD yesterday. -consulted Renal, likely dialysis today -continue darbepoetin, dialyvite  #  Anemia of Chronic Disease - secondary to ESRD, with significant increase in hemoglobin this admission from baseline, likely representing hemoconcentration. -will avoid significant IV fluid repletion given ESRD status, plan for dialysis -daily cbc's -continue darbepoetin  # Tachycardia - patient has a history of SVT, managed by Dr. Percival Spanish.  Patient had spontaneous episode of tachycardia overnight, which spontaneously resolved. -continue telemetry -continue metoprolol  # Aortic Stenosis - moderate, asymptomatic, valve area 1.81 cm -f/u echo in 1 year, f/u with Hochrein  # Hypotension - chronic, on metoprolol for SVT -continue metoprolol  # GERD - chronic, stable -protonix  # Prophy - lovenox   LOS: 1 day   Elnora Morrison 06/26/2011, 11:45 AM

## 2011-06-26 NOTE — ED Notes (Signed)
Patient unable to stand for last set of orthostatic vs

## 2011-06-26 NOTE — Progress Notes (Signed)
PT NOTE:  06/26/2011  Upon PT arrival pt performing self grooming in bed.  No apparent OT needs identified during PT evaluation.  Suggesting discontinue order for OT.    Hinton Luellen L. Ilija Maxim DPT [phone]

## 2011-06-27 ENCOUNTER — Encounter (INDEPENDENT_AMBULATORY_CARE_PROVIDER_SITE_OTHER): Payer: Self-pay | Admitting: Surgery

## 2011-06-27 ENCOUNTER — Inpatient Hospital Stay (HOSPITAL_COMMUNITY): Payer: Medicare Other

## 2011-06-27 LAB — CBC
Hemoglobin: 12.3 g/dL (ref 12.0–15.0)
MCH: 26.2 pg (ref 26.0–34.0)
MCV: 84.3 fL (ref 78.0–100.0)
RBC: 4.7 MIL/uL (ref 3.87–5.11)

## 2011-06-27 LAB — RENAL FUNCTION PANEL
CO2: 10 mEq/L — CL (ref 19–32)
Calcium: 8.9 mg/dL (ref 8.4–10.5)
Creatinine, Ser: 12.36 mg/dL — ABNORMAL HIGH (ref 0.50–1.10)
Glucose, Bld: 72 mg/dL (ref 70–99)

## 2011-06-27 MED ORDER — SIMETHICONE 80 MG PO CHEW
80.0000 mg | CHEWABLE_TABLET | Freq: Four times a day (QID) | ORAL | Status: DC | PRN
  Filled 2011-06-27 (×2): qty 1

## 2011-06-27 MED ORDER — LOPERAMIDE HCL 2 MG PO CAPS
2.0000 mg | ORAL_CAPSULE | Freq: Three times a day (TID) | ORAL | Status: DC
  Administered 2011-06-27 – 2011-06-28 (×2): 2 mg via ORAL
  Filled 2011-06-27 (×6): qty 1

## 2011-06-27 NOTE — Progress Notes (Signed)
CRITICAL VALUE ALERT  Critical value received: CO2 10 Date of notification:06/27/11  Time of notification:  [id number]  Critical value read back:yes Nurse who received alert:  T.KeelingRN  MD notified (1st page):  C.[name] PA  Time of first page: 1340 MD notified (2nd page):  Time of second page:  Responding MD:  C.[name] PA Time MD responded:  1340

## 2011-06-27 NOTE — Progress Notes (Signed)
Subjective: The patient notes continued diarrhea, occuring after eating any food.  Some response to imodium.  Objective: Vital signs in last 24 hours: Filed Vitals:   06/27/11 1222 06/27/11 1242 06/27/11 1245 06/27/11 1300  BP: 102/69 96/64 109/69 107/71  Pulse: 90 89 87 96  Temp: 98.4 F (36.9 C)     TempSrc: Oral     Resp: 20 20 20 20   Height:      Weight: 97 lb 14.2 oz (44.4 kg)     SpO2: 96%      Weight change: 15.2 oz (0.43 kg)  Intake/Output Summary (Last 24 hours) at 06/27/11 1327 Last data filed at 06/27/11 0900  Gross per 24 hour  Intake    480 ml  Output      0 ml  Net    480 ml   Physical Exam: General: alert, cooperative, and in no apparent distress HEENT: pupils equal round and reactive to light, vision grossly intact, oropharynx clear and non-erythematous  Neck: supple, no lymphadenopathy Lungs: clear to ascultation bilaterally, normal work of respiration, no wheezes, rales, ronchi Heart: regular rate and rhythm, grade 2/6 systolic murmur best heard at LUSB Abdomen: soft, non-tender, non-distended, normal bowel sounds, area of staple removal clean/dry/intact Extremities: no cyanosis, clubbing, or edema Neurologic: alert & oriented X3, cranial nerves II-XII intact, strength grossly intact, sensation intact to light touch  Lab Results: Basic Metabolic Panel:  Lab 99991111 0500 06/25/11 1845  NA 139 138  K 4.3 4.0  CL 106 102  CO2 11* 18*  GLUCOSE 53* 93  BUN 34* 30*  CREATININE 10.55* 10.09*  CALCIUM 9.4 10.4  MG -- --  PHOS 4.6 4.7*   Liver Function Tests:  Lab 06/26/11 0500 06/25/11 1845  AST -- 21  ALT -- 12  ALKPHOS -- 112  BILITOT -- 0.3  PROT -- 8.4*  ALBUMIN 3.0* 3.5   CBC:  Lab 06/27/11 1300 06/26/11 0500 06/25/11 1845  WBC 5.9 5.2 --  NEUTROABS -- -- 1.6*  HGB 12.3 12.4 --  HCT 39.6 40.8 --  MCV 84.3 86.3 --  PLT 248 224 --    Micro Results: Recent Results (from the past 240 hour(s))  CLOSTRIDIUM DIFFICILE BY PCR      Status: Normal   Collection Time   06/26/11  2:30 AM      Component Value Range Status Comment   C difficile by pcr NEGATIVE  NEGATIVE  Final    Studies/Results: No results found.  Medications: I have reviewed the patient's current medications. Scheduled Meds:    . enoxaparin  30 mg Subcutaneous Daily  . gabapentin  600 mg Oral Daily  . metoprolol succinate  12.5 mg Oral Daily  . multivitamin  1 tablet Oral QHS  . pantoprazole  40 mg Oral Q1200  . sodium chloride  3 mL Intravenous Q12H   Continuous Infusions:  PRN Meds:.acetaminophen, acetaminophen, heparin, hydrocortisone cream, loperamide  Assessment/Plan: The patient is a 61 yo woman, history of ESRD, history of ischemic colitis s/p ileostomy reversal 1 month ago, presenting with diarrhea.  # Diarrhea - the patient notes a 5-day history of 7-8 loose Dashley Monts stools, with no abd pain, nausea, or vomiting.  The patient is s/p ileostomy reversal 1 month ago, but had normal bowel movements following this procedure.  Diarrhea is likely related to hemicolectomy, with removal of cecal valve, though it is unclear why the patient had formed stools initially following her procedure.  No evidence of leukocytosis or abd pain  to suggest abdominal abscess.  C diff PCR negative. -c diff PCR neg -f/u stool cultures -if fever or leukocytosis, will get abd CT and blood cultures -informed Dr. Ninfa Linden about patient's admission, appreciate phone advice -imodium TID scheduled, and prn  # ESRD - on Tu/Th/Sa HD, did not have HD yesterday. -consulted Renal, dialysis today -continue darbepoetin, dialyvite  # Anemia of Chronic Disease - secondary to ESRD, with significant increase in hemoglobin this admission from baseline, likely representing hemoconcentration. -will avoid significant IV fluid repletion given ESRD status, plan for dialysis -daily cbc's -continue darbepoetin  # Tachycardia - patient has a history of SVT, managed by Dr. Percival Spanish.   Patient had spontaneous episode of tachycardia overnight, which spontaneously resolved. -continue telemetry -continue metoprolol  # Aortic Stenosis - moderate, asymptomatic, valve area 1.81 cm -f/u echo in 1 year, f/u with Hochrein  # Hypotension - chronic, on metoprolol for SVT -continue metoprolol  # GERD - chronic, stable -protonix  # Prophy - lovenox   LOS: 2 days   Elnora Morrison 06/27/2011, 1:27 PM

## 2011-06-27 NOTE — Evaluation (Signed)
Occupational Therapy Evaluation and Discharge Patient Details Name: [name] MRN: [id number] DOB: 11/27/1950 Today's Date: 06/27/2011  Problem List:  Patient Active Problem List  Diagnoses  . Lumbar stenosis with neurogenic claudication  . Spondylolisthesis of lumbar region  . End stage renal disease on dialysis  . Hypertension  . Unspecified viral hepatitis C without hepatic coma  . Radiculopathy  . Anemia of chronic kidney failure  . Acute ischemic colitis  . Radiculopathy of lumbar region  . Altered mental status  . Sinus tachycardia  . Ileostomy in place  . Tachycardia  . Aortic stenosis  . Preop cardiovascular exam  . Diarrhea  . Hypotension    Past Medical History:  Past Medical History  Diagnosis Date  . Seizures     r/t HTN in 1990's x 1  . Brain aneurysm     No records could be found  . Blood transfusion 1990's    r/t Kidney removal surgery  . Arthritis     Back  . Anemia     anemia of chronic disease likely 2/2 ESRD per last anemia panel (02/2011) with Fe 35, TIBC 167, ferritin 2041  // BL Hgb 8-10  . Umbilical hernia age 86  . Heart murmur     Born with heart mumur, does not require follow up per pt  . Hypertension     Does not see a heart doctor, had pre transplant stress test at [facility]    . End stage renal disease on dialysis     Secondary to hypertension // T/Th/Sat dialysis on [employer]  . Hepatitis C   . Colitis, ischemic 01/2011    S/P partial colectomy of right hemicolon and ileostomy placement  . Lumbar spinal stenosis      bilateral L4-5 and L5-S1 posterior lumbar interbody arthrodesis and bilateral L4-S1 posterior lateral arthrodesis for lumbar stenosis dynamic lumbar spondylolisthesis lumbar spondylosis lumbar radiculopathy with foot drop // s/p surgery 01/2011 - see surgery section for details.  Marland Kitchen Dysrhythmia     SVT for brief period in Feb 2013  . Dialysis patient     [place]  . Diabetes  mellitus     Borderline  . Foot drop, bilateral     after back surgery  . Anuria     Due to dialysis  . Aortic stenosis, mild     03/2011 echo   Past Surgical History:  Past Surgical History  Procedure Date  . Nephrectomy 2010    right side done at [facility] on transplant list  . Umbilical hernia repair age 76- 63  . Appendectomy 1960's  . Vascular surgery     right arm dialysis graft  . Tonsillectomy     as a child  . Rib resection     d/t kidney removal  . Back surgery 01/30/2011    L4-S1 lumbar laminectomy facetectomy and foraminotomies for decompression a bilateral L4-5 and L5-S1 posterior lumbar interbody arthrodesis and bilateral L4-S1 posterior lateral arthrodesis for lumbar stenosis dynamic lumbar spondylolisthesis lumbar spondylosis lumbar radiculopathy with foot drop  . Laparotomy 02/09/2011    Procedure: EXPLORATORY LAPAROTOMY;  Surgeon: Harl Bowie, MD;  Location: [place];  Service: General;  Laterality: N/A;  . Partial colectomy 02/09/2011    Reason for surgery: ischemic colitis of right hemicolon; Surgeon: Harl Bowie, MD;  Location: [place];  Service: General;  Laterality: Right;  . Colostomy 02/09/2011    Reason for surgery: ischemic colitis of right hemicolon requiring partial  colectomy and ileostomy.  Surgeon: Harl Bowie, MD;  Location: [place];  Service: General;  Laterality: Right;  . Colonoscopy 02/19/2011    Procedure: COLONOSCOPY;  Surgeon: Missy Sabins, MD;  Location: [place];  Service: Endoscopy;  Laterality: N/A;  . Colonoscopy 03/07/2011    Procedure: COLONOSCOPY;  Surgeon: Cleotis Nipper, MD;  Location: [facility] ENDOSCOPY;  Service: Endoscopy;  Laterality: N/A;  no prep needed--pt has long Hartmann's pouch and ileostomy  . Total parathyroidectomy 01/17/2001    with autotransplantation into left forearm // due to secondary hyperparathyroidism from ESRD  . Av fistula placement 04/08/2011    Procedure: INSERTION OF ARTERIOVENOUS (AV) GORE-TEX  GRAFT ARM;  Surgeon: Rosetta Posner, MD;  Location: [facility] OR;  Service: Vascular;  Laterality: Right;  Revision of Upper Arm Gore-Tex Graft  . Ileostomy closure 05/29/2011    Procedure: ILEOSTOMY TAKEDOWN;  Surgeon: Harl Bowie, MD;  Location: [place];  Service: General;  Laterality: N/A;    OT Assessment/Plan/Recommendation OT Assessment Clinical Impression Statement: This 61 yo female admitted diarrhea and complex medical history presents to acute OT with her report that she does foresee any issues with getting back to her PLOF and even better with continued HHPT. Acute OT will D/C OT Recommendation/Assessment: Patient does not need any further OT services OT Recommendation Follow Up Recommendations: No OT follow up Equipment Recommended: None recommended by OT OT Goals    OT Evaluation Precautions/Restrictions  Precautions Precautions: Fall Required Braces or Orthoses DO NOT USE: Yes Other Brace/Splint: Bil AFOs Restrictions Weight Bearing Restrictions: No Prior Functioning Home Living Lives With: Daughter Available Help at Discharge: Family Type of Home: House Home Access: Ramped entrance Home Layout: One level Bathroom Shower/Tub: Product/process development scientist: Standard Bathroom Accessibility: Yes How Accessible: Accessible via wheelchair;Accessible via walker Home Adaptive Equipment: Walker - rolling;Wheelchair - manual;Bedside commode/3-in-1;Shower chair with back Prior Function Level of Independence:  (Mod I) Able to Take Stairs?: No Driving: No Vocation: Retired  ADL ADL Ambulation Related to ADLs: Pt doing minimal ambulation at home--just starting with PT with Bil AFOs with home health. ADL Comments: Pt has been using bedpan upon my arrival, but should be able to use 3-n-1 at bedside. Brought out bedside commode and pt was able to transfer to it with minguard A stand pivot to and from. Made nurse tech aware that pt could do this with min guard A. Pt is  not concerned about being able to get back to taking care of herself at home and does have prn A. Vision/Perception    Cognition Cognition Arousal/Alertness: Awake/alert Overall Cognitive Status: Appears within functional limits for tasks assessed Orientation Level: Oriented X4 Sensation/Coordination   Extremity Assessment RUE Assessment RUE Assessment: Within Functional Limits LUE Assessment LUE Assessment: Within Functional Limits Mobility  Bed Mobility Bed Mobility: Yes Supine to Sit: 6: Modified independent (Device/Increase time);With rails;HOB elevated (Comment degrees) (30 degrees) Sit to Supine: 6: Modified independent (Device/Increase time);With rail;HOB elevated (comment degrees) (30 degrees) Exercises   End of Session OT - End of Session Equipment Utilized During Treatment: Other (comment) (3-n-1) Activity Tolerance: Patient tolerated treatment well Patient left: in bed;with call bell in reach;with bed alarm set Nurse Communication: Mobility status for transfers (nurse tech) General Behavior During Session: [facility] for tasks performed Cognition: [facility] for tasks performed   Almon Register [id number] 06/27/2011, 1:22 PM

## 2011-06-27 NOTE — Progress Notes (Signed)
Subjective:   Frequent watery diarrhea, but no other complaints  Objective: Vital signs in last 24 hours: Temp:  [97.7 F (36.5 C)-98 F (36.7 C)] 97.7 F (36.5 C) (04/11 0559) Pulse Rate:  [71-94] 90  (04/11 0559) Resp:  [17-18] 18  (04/11 0559) BP: (94-114)/(66-75) 114/72 mmHg (04/11 0559) SpO2:  [94 %-98 %] 94 % (04/11 0559) Weight:  [45.2 kg (99 lb 10.4 oz)] 45.2 kg (99 lb 10.4 oz) (04/10 2028) Weight change: 0.43 kg (15.2 oz)  Intake/Output from previous day: 04/10 0701 - 04/11 0700 In: 720 [P.O.:720] Out: -    EXAM: General appearance:  Alert, in no apparent distress Resp: CTA bilaterally Cardio:  RRR with Gr II/VI systolic murmur GI:  = BS, soft and nontender Extremities:  No edema Access:  AVG @ RUA with + bruit  Lab Results:  Basename 06/26/11 0500 06/25/11 1845  WBC 5.2 4.9  HGB 12.4 13.3  HCT 40.8 43.6  PLT 224 232   BMET:  Basename 06/26/11 0500 06/25/11 1845  NA 139 138  K 4.3 4.0  CL 106 102  CO2 11* 18*  GLUCOSE 53* 93  BUN 34* 30*  CREATININE 10.55* 10.09*  CALCIUM 9.4 10.4  ALBUMIN 3.0* 3.5   No results found for this basename: PTH:2 in the last 72 hours Iron Studies: No results found for this basename: IRON,TIBC,TRANSFERRIN,FERRITIN in the last 72 hours  Assessment/Plan: 1.  Diarrhea - unknown etiology, C. Diff negative; likely functional rather than infectious process; s/p R hemicolectomy (secondary to ischemic colitis) in 02/2011 and ileostomy takedown on 05/28/11.  2.  ESRD - on HD on TTS, K stable at 4.3, last HD on 4/6.  HD pending today.  3.  Hypertension/volume - BP 94/66 likely secondary to diarrhea, on Toprol XL 12.5 mg qd; wt 45.2 kg yesterday (EDW 49 kg).  No fluid removal today. 4.  Anemia - Hgb 12.4, on max Epogen as outpatient.  5.  Metabolic bone disease - Ca 9.4 (10.2 corrected), last P 2.7 on 3/21, s/p parathyroidectomy in '02.  6.  Nutrition - Alb  3 yesterday (recent drop, previously above 4). 7.  History of sinus  tachycardia - on Toprol XL.     LOS: 2 days   Mccarthy,Jenna 06/27/2011,9:02 AM  Patient seen and examined and agree with assessment and plan as above.  Jenna Splinter  MD [facility] [phone] pgr    [phone] cell 06/27/2011, 10:26 AM

## 2011-06-27 NOTE — Progress Notes (Signed)
Watery diarrhea persists w/o blood, fever or pain.  Some response to immodium.  Will try increasing dose.  Presumably this relates to her surgery.

## 2011-06-28 LAB — CBC
Hemoglobin: 12.5 g/dL (ref 12.0–15.0)
MCH: 25.9 pg — ABNORMAL LOW (ref 26.0–34.0)
MCV: 82.6 fL (ref 78.0–100.0)
RBC: 4.82 MIL/uL (ref 3.87–5.11)

## 2011-06-28 LAB — RENAL FUNCTION PANEL
BUN: 13 mg/dL (ref 6–23)
CO2: 24 mEq/L (ref 19–32)
Chloride: 99 mEq/L (ref 96–112)
Creatinine, Ser: 6.61 mg/dL — ABNORMAL HIGH (ref 0.50–1.10)
Glucose, Bld: 69 mg/dL — ABNORMAL LOW (ref 70–99)

## 2011-06-28 MED ORDER — LOPERAMIDE HCL 2 MG PO CAPS: 2.0000 mg | ORAL_CAPSULE | ORAL | Status: AC | PRN

## 2011-06-28 MED ORDER — POTASSIUM CHLORIDE CRYS ER 20 MEQ PO TBCR
40.0000 meq | EXTENDED_RELEASE_TABLET | ORAL | Status: DC
  Administered 2011-06-28: 40 meq via ORAL
  Filled 2011-06-28: qty 2

## 2011-06-28 NOTE — Progress Notes (Signed)
Subjective: The patient notes no watery bowel movements in the last 24 hours, after the initiation of imodium.  Patient underwent dialysis yesterday with no acute complications.  Objective: Vital signs in last 24 hours: Filed Vitals:   06/27/11 1700 06/27/11 2130 06/28/11 0459 06/28/11 0506  BP: 114/70 96/61 132/87   Pulse: 89 98 77   Temp: 98.2 F (36.8 C) 98 F (36.7 C) 97.8 F (36.6 C)   TempSrc: Oral Oral Oral   Resp: 20 18 17    Height:      Weight:  96 lb 9 oz (43.8 kg)    SpO2: 98% 100% 82% 94%   Weight change: -1 lb 12.2 oz (-0.8 kg)  Intake/Output Summary (Last 24 hours) at 06/28/11 1037 Last data filed at 06/27/11 1700  Gross per 24 hour  Intake    240 ml  Output   -100 ml  Net    340 ml   Physical Exam: General: alert, cooperative, and in no apparent distress HEENT: pupils equal round and reactive to light, vision grossly intact, oropharynx clear and non-erythematous  Neck: supple, no lymphadenopathy Lungs: clear to ascultation bilaterally, normal work of respiration, no wheezes, rales, ronchi Heart: regular rate and rhythm, grade 2/6 systolic murmur best heard at LUSB Abdomen: soft, non-tender, non-distended, normal bowel sounds, area of staple removal clean/dry/intact Extremities: no cyanosis, clubbing, or edema Neurologic: alert & oriented X3, cranial nerves II-XII intact, strength grossly intact, sensation intact to light touch  Lab Results: Basic Metabolic Panel:  Lab [id number] 0650 06/27/11 1300  NA 140 138  K 2.9* 3.9  CL 99 106  CO2 24 10*  GLUCOSE 69* 72  BUN 13 38*  CREATININE 6.61* 12.36*  CALCIUM 8.7 8.9  MG -- --  PHOS 3.0 4.7*   Liver Function Tests:  Lab 06/28/11 0650 06/27/11 1300 06/25/11 1845  AST -- -- 21  ALT -- -- 12  ALKPHOS -- -- 112  BILITOT -- -- 0.3  PROT -- -- 8.4*  ALBUMIN 3.4* 3.1* --   CBC:  Lab 06/28/11 0650 06/27/11 1300 06/25/11 1845  WBC 4.3 5.9 --  NEUTROABS -- -- 1.6*  HGB 12.5 12.3 --  HCT 39.8 39.6 --    MCV 82.6 84.3 --  PLT 222 248 --    Micro Results: Recent Results (from the past 240 hour(s))  STOOL CULTURE     Status: Normal (Preliminary result)   Collection Time   06/26/11  2:30 AM      Component Value Range Status Comment   Specimen Description STOOL   Final    Special Requests Normal   Final    Culture Culture reincubated for better growth   Final    Report Status PENDING   Incomplete   CLOSTRIDIUM DIFFICILE BY PCR     Status: Normal   Collection Time   06/26/11  2:30 AM      Component Value Range Status Comment   C difficile by pcr NEGATIVE  NEGATIVE  Final    Studies/Results: No results found.  Medications: I have reviewed the patient's current medications. Scheduled Meds:    . enoxaparin  30 mg Subcutaneous Daily  . gabapentin  600 mg Oral Daily  . loperamide  2 mg Oral TID AC  . metoprolol succinate  12.5 mg Oral Daily  . multivitamin  1 tablet Oral QHS  . pantoprazole  40 mg Oral Q1200  . potassium chloride  40 mEq Oral Q4H  . sodium chloride  3 mL  Intravenous Q12H   Continuous Infusions:  PRN Meds:.acetaminophen, acetaminophen, heparin, hydrocortisone cream, loperamide, simethicone  Assessment/Plan: The patient is a 61 yo woman, history of ESRD, history of ischemic colitis s/p ileostomy reversal 1 month ago, presenting with diarrhea.  # Diarrhea - the patient notes a 5-day history of 7-8 loose [name] stools, with no abd pain, nausea, or vomiting.  The patient is s/p ileostomy reversal 1 month ago, but had normal bowel movements following this procedure.  Diarrhea is likely related to hemicolectomy, with removal of cecal valve, though it is unclear why the patient had formed stools initially following her procedure.  Another possibility is acute viral gastroenteritis, as symptom onset started after a healthcare visit.  No evidence of leukocytosis or abd pain to suggest abdominal abscess.  C diff PCR negative.   -Symptoms have now resolved, will discharge home  with prn imodium for symptom control -c diff PCR neg -informed Dr. Ninfa Linden about patient's admission, appreciate phone advice -imodium TID scheduled, and prn  # ESRD - on Tu/Th/Sa HD, did not have HD yesterday. -consulted Renal, dialysis today -continue darbepoetin, dialyvite -pt to undergo outpatient dialysis tomorrow  # Anemia of Chronic Disease - secondary to ESRD, with significant increase in hemoglobin this admission from baseline, likely representing hemoconcentration. -will avoid significant IV fluid repletion given ESRD status, plan for dialysis -daily cbc's -continue darbepoetin  # Tachycardia - patient has a history of SVT, managed by Dr. Percival Spanish.  Patient had spontaneous episode of tachycardia overnight, which spontaneously resolved. -continue telemetry -continue metoprolol  # Aortic Stenosis - moderate, asymptomatic, valve area 1.81 cm -f/u echo in 1 year, f/u with Hochrein  # Hypotension - chronic, on metoprolol for SVT -continue metoprolol  # GERD - chronic, stable -protonix  # Prophy - lovenox   LOS: 3 days   [name] 06/28/2011, 10:37 AM

## 2011-06-28 NOTE — Progress Notes (Signed)
   CARE MANAGEMENT NOTE 06/28/2011  Patient:  [name], [name]   Account Number:  [id number]  Date Initiated:  06/26/2011  Documentation initiated by:  Shundra Wirsing  Subjective/Objective Assessment:   Request for [facility], however no specific needs identified.     Action/Plan:   Following for identification of needs.  06/26/2011 Met with pt who is active with [facility], and wishes to continue to use [facility] for [facility] services.   Anticipated DC Date:  06/29/2011   Anticipated DC Plan:  [place]         Choice offered to / List presented to:             Status of service:  Completed, signed off Medicare Important Message given?   (If response is "NO", the following Medicare IM given date fields will be blank) Date Medicare IM given:   Date Additional Medicare IM given:    Discharge Disposition:  [place]  Per UR Regulation:    If discussed at Long Length of Stay Meetings, dates discussed:    Comments:  06/28/2011 Pt active with [facility] and wishes to continue services with this agency , please order to resume [facility] services. Jasmine Pang RN MPH Case Managerment

## 2011-06-28 NOTE — Progress Notes (Signed)
. Subjective:  No cos,no more diarrhea, noted  For dc home today Objective Vital signs in last 24 hours: Filed Vitals:   06/28/11 0459 06/28/11 0506 06/28/11 0900 06/28/11 1346  BP: 132/87  89/56 78/57  Pulse: 77  97 66  Temp: 97.8 F (36.6 C)  97.5 F (36.4 C) 98.2 F (36.8 C)  TempSrc: Oral  Oral Oral  Resp: 17  18 17   Height:      Weight:      SpO2: 82% 94% 98% 99%   Weight change: -0.8 kg (-1 lb 12.2 oz)  Intake/Output Summary (Last 24 hours) at 06/28/11 1407 Last data filed at 06/28/11 1347  Gross per 24 hour  Intake    480 ml  Output   -100 ml  Net    580 ml   Labs: Basic Metabolic Panel:  Lab [id number] 0650 06/27/11 1300 06/26/11 0500  NA 140 138 139  K 2.9* 3.9 4.3  CL 99 106 106  CO2 24 10* 11*  GLUCOSE 69* 72 53*  BUN 13 38* 34*  CREATININE 6.61* 12.36* 10.55*  CALCIUM 8.7 8.9 9.4  ALB -- -- --  PHOS 3.0 4.7* 4.6   Liver Function Tests:  Lab 06/28/11 0650 06/27/11 1300 06/26/11 0500 06/25/11 1845  AST -- -- -- 21  ALT -- -- -- 12  ALKPHOS -- -- -- 112  BILITOT -- -- -- 0.3  PROT -- -- -- 8.4*  ALBUMIN 3.4* 3.1* 3.0* --   No results found for this basename: LIPASE:3,AMYLASE:3 in the last 168 hours No results found for this basename: AMMONIA:3 in the last 168 hours CBC:  Lab 06/28/11 0650 06/27/11 1300 06/26/11 0500 06/25/11 1845  WBC 4.3 5.9 5.2 --  NEUTROABS -- -- -- 1.6*  HGB 12.5 12.3 12.4 --  HCT 39.8 39.6 40.8 --  MCV 82.6 84.3 86.3 86.0  PLT 222 248 224 --   Cardiac Enzymes: No results found for this basename: CKTOTAL:5,CKMB:5,CKMBINDEX:5,TROPONINI:5 in the last 168 hours CBG: No results found for this basename: GLUCAP:5 in the last 168 hours  Iron Studies: No results found for this basename: IRON,TIBC,TRANSFERRIN,FERRITIN in the last 72 hours Studies/Results: No results found. Medications:      . enoxaparin  30 mg Subcutaneous Daily  . gabapentin  600 mg Oral Daily  . loperamide  2 mg Oral TID AC  . metoprolol succinate   12.5 mg Oral Daily  . multivitamin  1 tablet Oral QHS  . pantoprazole  40 mg Oral Q1200  . potassium chloride  40 mEq Oral Q4H  . sodium chloride  3 mL Intravenous Q12H   I  have reviewed scheduled and prn medications.  Physical Exam: General:  Alert ,NAD Heart: RRR. 2/6 sem lsb, no rub Lungs:  CTA bilaterally Abdomen: BS +=, soft, nontender Extremities: Dialysis Access: no pedal edema,  AVG @ RUA with + bruit   Problem/Plan: 1  Diarrhea - unknown etiology, C. Diff negative; likely functional rather than infectious process; s/p R hemicolectomy (secondary to ischemic colitis) in 02/2011 and ileostomy takedown on 05/28/11.  For dc today per admit prn Immodium. 2. ESRD= HD TTS [facility]- 3.Anemia - hgb 12.5, [facility]. 4.Secondary hyperparathyroidism -  Ca 8.7  , phos 3.0  No binders, no zemplar  5.   HTN/volume - on Toprol XL 12.5 mg qd.  Ernest Haber, PA-C [place] [phone] 06/28/2011,2:07 PM  LOS: 3 days   Patient seen and examined and agree with  assessment and plan as above.  Kelly Splinter  MD [facility] [phone] pgr    [phone] cell 06/28/2011, 3:43 PM

## 2011-06-28 NOTE — Discharge Summary (Signed)
[facility] Discharge Note  Name: [name] MRN: [id number] DOB: [birth year] 61 y.o.  Date of Admission: 06/25/2011  6:27 PM Date of Discharge: 06/28/2011 Attending Physician: Milta Deiters, MD  Discharge Diagnosis: 1. Diarrhea - s/p R hemicolectomy, likely functional vs acute gastroenteritis 2. ESRD - on Tu/Th/Sa dialysis 3. Anemia of Chronic Disease 4. Tachycardia 5. Aortic Stenosis 6. Chronic Hypotension 7. GERD  Discharge Medications: Medication List  As of 06/28/2011 10:41 AM   ASK your doctor about these medications         darbepoetin 200 MCG/0.4ML Soln   Commonly known as: ARANESP   Inject 200 mcg into the skin every 7 (seven) days. Administered at dialysis per nephrology      DIALYVITE 800 WITH ZINC 0.8 MG Tabs   Take 1 tablet by mouth daily.      gabapentin 300 MG capsule   Commonly known as: NEURONTIN   Take 600 mg by mouth every morning.      metoprolol succinate 12.5 mg Tb24   Commonly known as: TOPROL-XL   Take 12.5 mg by mouth daily.      multivitamin Tabs tablet   Take 1 tablet by mouth daily.      pantoprazole 40 MG tablet   Commonly known as: PROTONIX   Take 40 mg by mouth daily at 12 noon.            Disposition and follow-up:   [name] was discharged from [facility] in stable and improved condition, with resolution of diarrhea.  The patient will follow-up with Dr. Ninfa Linden on 4/15 for further management of her diarrhea, s/p R hemicolectomy  Follow-up Appointments: Follow-up Information    Follow up with Harl Bowie, MD on 07/01/2011. (9:00 am)    Contact information:   [facility] [address], [place] [phone]          Discharge Orders    Future Appointments: Provider: Department: Dept Phone: Center:   07/01/2011 9:00 AM Harl Bowie, MD [facility] [phone] None      Consultations:  Renal ([facility])  Procedures Performed:  Dialysis  Admission HPI:  [name] is a 61 y.o.female with past medical history significant for ESRD on HD, and ileostomy reversal one month ago who presents with diarrhea for one week.  [name] states that she has had diarrhea for about a week. Denied abdominal pain or vomiting. Daughter called EMS as diarrhea had not abated and she was brought to the ED. She states she had diarrhea everytime she ate or drank anything. She states the diarrhea is Cheron Coryell/green liquid. There is no blood in her diarrhea. She feels slighly weak. Denies N/V, and F/C. Has not taken any antibiotics recently. Had ileostomy takedown in March 13, and Had her staples removed last week. No sick contacts. No diet changes or travel. Drinks city water.  Of note, patient underwent elective spinal surgery for L4-S1 laminectomy on 01/30/2011. In the postsurgical period she developed ischemic colitis on approximately November 24. She then underwent right hemicolectomy with ileal stoma placement. As she was recovering from hemicolectomy it was found that she had bilateral foot drop, and now requires a wheelchair. On November 29 she developed an abdominal abscess and required percutaneous CT drainage. At that time she was treated with vancomycin and Zosyn and ciprofloxacin.. On December 28 she developed a lump left upper extremity DVT. She was not  just anticoagulated at that time. In January 2013 she was admitted with altered mental status, and was found to have sinus tachycardia of unclear etiology. She underwent workup by the cardiology service with no cause found. On January 21 she had an echocardiogram that showed ejection fraction of 65% with grade 1 diastolic dysfunction. She has moderate aortic stenosis with total valve area 1.81 cm. TSH was normal at that time and she was started on beta blocker. Since January she has developed hypotension to the 80s over 50s also with  unclear cause.   Admission Physical Exam Blood pressure 108/74, pulse 91, temperature 98.2 F (36.8 C), temperature source Oral, resp. rate 12, SpO2 96.00%.  Gen: Well-developed, frail appearing, elderly woman in no acute distress; alert, appropriate and cooperative throughout examination. Head: Normocephalic, atraumatic. Eyes: PERRL, EOMI, No signs of anemia or jaundince.  Nose: Mucous membranes moist, not inflammed, nonerythematous. Throat: Oropharynx nonerythematous, no exudate appreciated.  Neck: Supple with no deformities, masses, or tenderness noted. No carotid Bruits, no JVD. Diminished carotid pulse. Lungs: Normal respiratory effort. Clear to auscultation BL, without crackles or wheezes. Heart: RRR alternating with sinus tach to the 140s. 2/6 systolic murmur best heard at LUSB.  Abdomen: BS normoactive. Soft, nondistended, non-tender. No masses or organomegaly. Extremities: No pretibial edema. Decreased peripheral pulses throughout. AV fistula in RUE.  Neurologic: A&O X3, CN II - XII are grossly intact. Motor strength is 5/5 in the all 4 extremities with the exception of 4/5 strength in the bilateral feet, Sensations intact to light touch, but decreased in the toes. No focal neurologic deficit Skin: No Rashes. Post surgical scars on the abdomen and lower spine. No bedsores.  Psych: mood and affect are normal.   Admission Labs Basic Metabolic Panel:   [facility]  06/25/11 1845   NA  138   K  4.0   CL  102   CO2  18*   GLUCOSE  93   BUN  30*   CREATININE  10.09*   CALCIUM  10.4   MG  --   PHOS  4.7*    CBC:  Basename  06/25/11 1845   WBC  4.9   NEUTROABS  1.6*   HGB  13.3   HCT  43.6   MCV  86.0   PLT  232     Hospital Course by problem list: 1. Diarrhea - The patient presented with a 1-week history of 7-10 loose stools/day, with no nausea, vomiting, or abdominal pain, and with FOBT negative.  The patient is s/p R hemicolectomy with removal of ileocecal valve, and  recently had her colostomy take-down 1 month ago.  She noted formed stools for 3 weeks following the procedure, then 1 week of diarrhea.  The patient's symptoms were thought most likely due to functional changes related to her R hemicolectomy (though it's difficult to explain the 3 weeks of formed stools).  Another possibility is acute viral gastroenteritis, as the patient had a healthcare visit on the day prior to symptom onset.  C. Diff testing was found to be negative, and the patient's symptoms resolved with imodium.  The patient noted no diarrhea on the day of discharge.  The patient will further follow-up with her surgeon, Dr. Ninfa Linden.  2. ESRD - The patient has a history of ESRD, on Tu/Th/Sa dialysis, having missed dialysis on the day prior to admission due to diarrhea.  The patient's dialysis was continued during hospitalization, and resumed at discharge.  3. Anemia of Chronic Disease -  the patient's hemoglobin was found to be at baseline.  Darbepoetin was continued during hospitalization.  4. Tachycardia - the patient has a history of SVT, managed by Dr. Percival Spanish.  The patient experienced intermittent self-limited asymptomatic episodes of SVT during hospitalization.  The patient's metoprolol was continued throughout hospitalization.  Time spent on discharge: 45 minutes  Discharge Vitals:  BP 132/87  Pulse 77  Temp(Src) 97.8 F (36.6 C) (Oral)  Resp 17  Ht 5\' 5"  (1.651 m)  Wt 96 lb 9 oz (43.8 kg)  BMI 16.07 kg/m2  SpO2 94%  Discharge Labs:  Results for orders placed during the hospital encounter of 06/25/11 (from the past 24 hour(s))  CBC     Status: Abnormal   Collection Time   06/27/11  1:00 PM      Component Value Range   WBC 5.9  4.0 - 10.5 (K/uL)   RBC 4.70  3.87 - 5.11 (MIL/uL)   Hemoglobin 12.3  12.0 - 15.0 (g/dL)   HCT 39.6  36.0 - 46.0 (%)   MCV 84.3  78.0 - 100.0 (fL)   MCH 26.2  26.0 - 34.0 (pg)   MCHC 31.1  30.0 - 36.0 (g/dL)   RDW 18.4 (*) 11.5 - 15.5 (%)    Platelets 248  150 - 400 (K/uL)  RENAL FUNCTION PANEL     Status: Abnormal   Collection Time   06/27/11  1:00 PM      Component Value Range   Sodium 138  135 - 145 (mEq/L)   Potassium 3.9  3.5 - 5.1 (mEq/L)   Chloride 106  96 - 112 (mEq/L)   CO2 10 (*) 19 - 32 (mEq/L)   Glucose, Bld 72  70 - 99 (mg/dL)   BUN 38 (*) 6 - 23 (mg/dL)   Creatinine, Ser 12.36 (*) 0.50 - 1.10 (mg/dL)   Calcium 8.9  8.4 - 10.5 (mg/dL)   Phosphorus 4.7 (*) 2.3 - 4.6 (mg/dL)   Albumin 3.1 (*) 3.5 - 5.2 (g/dL)   GFR calc non Af Amer 3 (*) >90 (mL/min)   GFR calc Af Amer 3 (*) >90 (mL/min)  RENAL FUNCTION PANEL     Status: Abnormal   Collection Time   06/28/11  6:50 AM      Component Value Range   Sodium 140  135 - 145 (mEq/L)   Potassium 2.9 (*) 3.5 - 5.1 (mEq/L)   Chloride 99  96 - 112 (mEq/L)   CO2 24  19 - 32 (mEq/L)   Glucose, Bld 69 (*) 70 - 99 (mg/dL)   BUN 13  6 - 23 (mg/dL)   Creatinine, Ser 6.61 (*) 0.50 - 1.10 (mg/dL)   Calcium 8.7  8.4 - 10.5 (mg/dL)   Phosphorus 3.0  2.3 - 4.6 (mg/dL)   Albumin 3.4 (*) 3.5 - 5.2 (g/dL)   GFR calc non Af Amer 6 (*) >90 (mL/min)   GFR calc Af Amer 7 (*) >90 (mL/min)  CBC     Status: Abnormal   Collection Time   06/28/11  6:50 AM      Component Value Range   WBC 4.3  4.0 - 10.5 (K/uL)   RBC 4.82  3.87 - 5.11 (MIL/uL)   Hemoglobin 12.5  12.0 - 15.0 (g/dL)   HCT 39.8  36.0 - 46.0 (%)   MCV 82.6  78.0 - 100.0 (fL)   MCH 25.9 (*) 26.0 - 34.0 (pg)   MCHC 31.4  30.0 - 36.0 (g/dL)   RDW 18.4 (*)  11.5 - 15.5 (%)   Platelets 222  150 - 400 (K/uL)    Signed: Elnora Morrison 06/28/2011, 10:41 AM

## 2011-06-28 NOTE — Discharge Instructions (Signed)
You were hospitalized with diarrhea.  Your diarrhea is most likely a result of your body getting used to your hemicolectomy procedure, and may continue for weeks to months as your body adjusts.  In the meantime, take Imodium as needed after loose stools to prevent further diarrhea.  Another possibility is that you picked up a "stomach bug", also called viral gastroenteritis, at your healthcare appointment last Thursday, which has now resolved.  See the attached information on viral gastroenteritis.   Viral Gastroenteritis Viral gastroenteritis is also known as stomach flu. This condition affects the stomach and intestinal tract. It can cause sudden diarrhea and vomiting. The illness typically lasts 3 to 8 days. Most people develop an immune response that eventually gets rid of the virus. While this natural response develops, the virus can make you quite ill. CAUSES  Many different viruses can cause gastroenteritis, such as rotavirus or noroviruses. You can catch one of these viruses by consuming contaminated food or water. You may also catch a virus by sharing utensils or other personal items with an infected person or by touching a contaminated surface. SYMPTOMS  The most common symptoms are diarrhea and vomiting. These problems can cause a severe loss of body fluids (dehydration) and a body salt (electrolyte) imbalance. Other symptoms may include:  Fever.   Headache.   Fatigue.   Abdominal pain.  DIAGNOSIS  Your caregiver can usually diagnose viral gastroenteritis based on your symptoms and a physical exam. A stool sample may also be taken to test for the presence of viruses or other infections. TREATMENT  This illness typically goes away on its own. Treatments are aimed at rehydration. The most serious cases of viral gastroenteritis involve vomiting so severely that you are not able to keep fluids down. In these cases, fluids must be given through an intravenous line (IV). HOME CARE  INSTRUCTIONS   Drink enough fluids to keep your urine clear or pale yellow. Drink small amounts of fluids frequently and increase the amounts as tolerated.   Ask your caregiver for specific rehydration instructions.   Avoid:   Foods high in sugar.   Alcohol.   Carbonated drinks.   Tobacco.   Juice.   Caffeine drinks.   Extremely hot or cold fluids.   Fatty, greasy foods.   Too much intake of anything at one time.   Dairy products until 24 to 48 hours after diarrhea stops.   You may consume probiotics. Probiotics are active cultures of beneficial bacteria. They may lessen the amount and number of diarrheal stools in adults. Probiotics can be found in yogurt with active cultures and in supplements.   Wash your hands well to avoid spreading the virus.   Only take over-the-counter or prescription medicines for pain, discomfort, or fever as directed by your caregiver. Do not give aspirin to children. Antidiarrheal medicines are not recommended.   Ask your caregiver if you should continue to take your regular prescribed and over-the-counter medicines.   Keep all follow-up appointments as directed by your caregiver.  SEEK IMMEDIATE MEDICAL CARE IF:   You are unable to keep fluids down.   You do not urinate at least once every 6 to 8 hours.   You develop shortness of breath.   You notice blood in your stool or vomit. This may look like coffee grounds.   You have abdominal pain that increases or is concentrated in one small area (localized).   You have persistent vomiting or diarrhea.   You have a fever.  The patient is a child younger than 3 months, and he or she has a fever.   The patient is a child older than 3 months, and he or she has a fever and persistent symptoms.   The patient is a child older than 3 months, and he or she has a fever and symptoms suddenly get worse.   The patient is a baby, and he or she has no tears when crying.  MAKE SURE YOU:    Understand these instructions.   Will watch your condition.   Will get help right away if you are not doing well or get worse.  Document Released: 03/04/2005 Document Revised: 02/21/2011 Document Reviewed: 12/19/2010 [facility] Patient Information 2012 [place].

## 2011-06-28 NOTE — Progress Notes (Signed)
06-28-11 UR completed. Magdalen Spatz RN BSN

## 2011-06-30 LAB — STOOL CULTURE: Special Requests: NORMAL

## 2011-07-01 ENCOUNTER — Encounter (INDEPENDENT_AMBULATORY_CARE_PROVIDER_SITE_OTHER): Payer: Self-pay | Admitting: Surgery

## 2011-07-01 ENCOUNTER — Ambulatory Visit (INDEPENDENT_AMBULATORY_CARE_PROVIDER_SITE_OTHER): Payer: Medicare Other | Admitting: Surgery

## 2011-07-01 VITALS — BP 96/66 | HR 72 | Temp 97.3°F | Resp 16 | Ht 65.0 in | Wt 104.0 lb

## 2011-07-01 DIAGNOSIS — Z09 Encounter for follow-up examination after completed treatment for conditions other than malignant neoplasm: Secondary | ICD-10-CM

## 2011-07-01 NOTE — Progress Notes (Signed)
Subjective:     Patient ID: [name], female   DOB: 02/27/1951, 61 y.o.   MRN: [id number]  HPI She is here for a postop visit status post colostomy takedown. She had a bout of diarrhea last week and had to come to the hospital but this is now resolved. She is eating well and moving her bowels well  Review of Systems     Objective:   Physical Exam On exam, her incisions are well healed. Her abdomen is soft and nontender    Assessment:     Patient doing well status post colostomy takedown    Plan:     I believe it is okay for her to use Imodium intermittently. Hopefully this loose bowel movements will eventually resolve over time. I will see her back as needed

## 2011-07-10 DIAGNOSIS — Z681 Body mass index (BMI) 19 or less, adult: Secondary | ICD-10-CM | POA: Insufficient documentation

## 2011-07-25 ENCOUNTER — Encounter (INDEPENDENT_AMBULATORY_CARE_PROVIDER_SITE_OTHER): Payer: Medicare Other | Admitting: Surgery

## 2011-08-17 ENCOUNTER — Encounter: Payer: Self-pay | Admitting: Cardiology

## 2011-09-28 ENCOUNTER — Encounter (HOSPITAL_BASED_OUTPATIENT_CLINIC_OR_DEPARTMENT_OTHER): Payer: Self-pay | Admitting: Emergency Medicine

## 2011-09-28 ENCOUNTER — Encounter: Payer: Self-pay | Admitting: Cardiology

## 2011-09-28 ENCOUNTER — Emergency Department (HOSPITAL_BASED_OUTPATIENT_CLINIC_OR_DEPARTMENT_OTHER)
Admission: EM | Admit: 2011-09-28 | Discharge: 2011-09-28 | Disposition: A | Discharge status: Home / Self Care | Payer: Medicare Other | Source: Home / Self Care | Attending: Emergency Medicine | Admitting: Emergency Medicine

## 2011-09-28 DIAGNOSIS — B029 Zoster without complications: Secondary | ICD-10-CM | POA: Insufficient documentation

## 2011-09-28 DIAGNOSIS — M129 Arthropathy, unspecified: Secondary | ICD-10-CM | POA: Insufficient documentation

## 2011-09-28 DIAGNOSIS — D649 Anemia, unspecified: Secondary | ICD-10-CM | POA: Insufficient documentation

## 2011-09-28 DIAGNOSIS — E119 Type 2 diabetes mellitus without complications: Secondary | ICD-10-CM | POA: Insufficient documentation

## 2011-09-28 DIAGNOSIS — I120 Hypertensive chronic kidney disease with stage 5 chronic kidney disease or end stage renal disease: Secondary | ICD-10-CM | POA: Insufficient documentation

## 2011-09-28 DIAGNOSIS — Z87891 Personal history of nicotine dependence: Secondary | ICD-10-CM | POA: Insufficient documentation

## 2011-09-28 DIAGNOSIS — N186 End stage renal disease: Secondary | ICD-10-CM | POA: Insufficient documentation

## 2011-09-28 DIAGNOSIS — Z992 Dependence on renal dialysis: Secondary | ICD-10-CM | POA: Insufficient documentation

## 2011-09-28 DIAGNOSIS — B1920 Unspecified viral hepatitis C without hepatic coma: Secondary | ICD-10-CM | POA: Insufficient documentation

## 2011-09-28 MED ORDER — HYDROCODONE-ACETAMINOPHEN 5-500 MG PO TABS: 1.0000 | ORAL_TABLET | Freq: Four times a day (QID) | ORAL | Status: DC | PRN

## 2011-09-28 MED ORDER — OXYCODONE-ACETAMINOPHEN 5-325 MG PO TABS
1.0000 | ORAL_TABLET | Freq: Once | ORAL | Status: AC
  Administered 2011-09-28: 1 via ORAL
  Filled 2011-09-28: qty 1

## 2011-09-28 MED ORDER — VALACYCLOVIR HCL 1 G PO TABS: 1000.0000 mg | ORAL_TABLET | Freq: Three times a day (TID) | ORAL | Status: DC

## 2011-09-28 NOTE — ED Notes (Signed)
Pt c/o rash to Rt foot and RLE x 1 week- describes as painful

## 2011-09-28 NOTE — ED Provider Notes (Signed)
History     CSN: [id number]  Arrival date & time 09/28/11  1125   First MD Initiated Contact with Patient 09/28/11 1144      Chief Complaint  Patient presents with  . Rash    (Consider location/radiation/quality/duration/timing/severity/associated sxs/prior treatment) HPI Comments: Patient presents with complaints of a rash to her right lower leg. She states that the rash started about a week ago and has progressively gotten worse. She describes it as a burning pain along the area of the rash. She noticed that it started with blisters which subsequently scab over. She denies any fevers or chills. Denies he nausea vomiting or diarrhea. Denies any cough or cold symptoms. She's not tried anything at home for the rash of the pain. She's not sure whether she had chickenpox as a child. She's currently on dialysis which she gets Tuesday Thursday Saturday and she did finish her dialysis today  Patient is a 61 y.o. female presenting with rash. The history is provided by the patient.  Rash  This is a new problem.    Past Medical History  Diagnosis Date  . Seizures     r/t HTN in 1990's x 1  . Brain aneurysm     No records could be found  . Blood transfusion 1990's    r/t Kidney removal surgery  . Arthritis     Back  . Anemia     anemia of chronic disease likely 2/2 ESRD per last anemia panel (02/2011) with Fe 35, TIBC 167, ferritin 2041  // BL Hgb 8-10  . Umbilical hernia age 87  . Heart murmur     Born with heart mumur, does not require follow up per pt  . Hypertension     Does not see a heart doctor, had pre transplant stress test at [facility]    . End stage renal disease on dialysis     Secondary to hypertension // T/Th/Sat dialysis on [employer]  . Hepatitis C   . Colitis, ischemic 01/2011    S/P partial colectomy of right hemicolon and ileostomy placement  . Lumbar spinal stenosis      bilateral L4-5 and L5-S1 posterior lumbar interbody arthrodesis and bilateral L4-S1  posterior lateral arthrodesis for lumbar stenosis dynamic lumbar spondylolisthesis lumbar spondylosis lumbar radiculopathy with foot drop // s/p surgery 01/2011 - see surgery section for details.  Marland Kitchen Dysrhythmia     SVT for brief period in Feb 2013  . Dialysis patient     [place]  . Diabetes mellitus     Borderline  . Foot drop, bilateral     after back surgery  . Anuria     Due to dialysis  . Aortic stenosis, mild     03/2011 echo    Past Surgical History  Procedure Date  . Nephrectomy 2010    right side done at [facility] on transplant list  . Umbilical hernia repair age 21- 39  . Appendectomy 1960's  . Vascular surgery     right arm dialysis graft  . Tonsillectomy     as a child  . Rib resection     d/t kidney removal  . Back surgery 01/30/2011    L4-S1 lumbar laminectomy facetectomy and foraminotomies for decompression a bilateral L4-5 and L5-S1 posterior lumbar interbody arthrodesis and bilateral L4-S1 posterior lateral arthrodesis for lumbar stenosis dynamic lumbar spondylolisthesis lumbar spondylosis lumbar radiculopathy with foot drop  . Laparotomy 02/09/2011    Procedure: EXPLORATORY LAPAROTOMY;  Surgeon: Marco Collie  Ninfa Linden, MD;  Location: [place];  Service: General;  Laterality: N/A;  . Partial colectomy 02/09/2011    Reason for surgery: ischemic colitis of right hemicolon; Surgeon: Harl Bowie, MD;  Location: [place];  Service: General;  Laterality: Right;  . Colostomy 02/09/2011    Reason for surgery: ischemic colitis of right hemicolon requiring partial colectomy and ileostomy.  Surgeon: Harl Bowie, MD;  Location: [place];  Service: General;  Laterality: Right;  . Colonoscopy 02/19/2011    Procedure: COLONOSCOPY;  Surgeon: Missy Sabins, MD;  Location: [place];  Service: Endoscopy;  Laterality: N/A;  . Colonoscopy 03/07/2011    Procedure: COLONOSCOPY;  Surgeon: Cleotis Nipper, MD;  [facility] ENDOSCOPY;  Service:  Endoscopy;  Laterality: N/A;  no prep needed--pt has long Hartmann's pouch and ileostomy  . Total parathyroidectomy 01/17/2001    with autotransplantation into left forearm // due to secondary hyperparathyroidism from ESRD  . Av fistula placement 04/08/2011    Procedure: INSERTION OF ARTERIOVENOUS (AV) GORE-TEX GRAFT ARM;  Surgeon: Rosetta Posner, MD;  Location: [facility] OR;  Service: Vascular;  Laterality: Right;  Revision of Upper Arm Gore-Tex Graft  . Ileostomy closure 05/29/2011    Procedure: ILEOSTOMY TAKEDOWN;  Surgeon: Harl Bowie, MD;  [facility] OR;  Service: General;  Laterality: N/A;    Family History  Problem Relation Age of Onset  . Anesthesia problems Neg Hx   . Hypotension Neg Hx   . Malignant hyperthermia Neg Hx   . Pseudochol deficiency Neg Hx   . Diabetes Father   . Stroke Mother     History  Substance Use Topics  . Smoking status: Former Smoker -- 0.5 packs/day for 48 years    Types: Cigarettes    Quit date: 08/17/2010  . Smokeless tobacco: Never Used  . Alcohol Use: No     1 pint on Fridays    OB History    Grav Para Term Preterm Abortions TAB SAB Ect Mult Living                  Review of Systems  Constitutional: Negative for fever, chills, diaphoresis and fatigue.  HENT: Negative for congestion, rhinorrhea and sneezing.   Eyes: Negative.   Respiratory: Negative for cough, chest tightness and shortness of breath.   Cardiovascular: Negative for chest pain and leg swelling.  Gastrointestinal: Negative for nausea, vomiting, abdominal pain, diarrhea and blood in stool.  Genitourinary: Negative for frequency, hematuria, flank pain and difficulty urinating.  Musculoskeletal: Negative for back pain and arthralgias.  Skin: Positive for rash.  Neurological: Negative for dizziness, speech difficulty, weakness, numbness and headaches.    Allergies  Contrast media and Iohexol  Home Medications   Current Outpatient Rx  Name Route Sig Dispense Refill  .  DIALYVITE 800/ZINC 0.8 MG PO TABS Oral Take 1 tablet by mouth daily.    Marland Kitchen DARBEPOETIN ALFA-POLYSORBATE 200 MCG/0.4ML IJ SOLN Subcutaneous Inject 200 mcg into the skin every 7 (seven) days. Administered at dialysis per nephrology    . GABAPENTIN 300 MG PO CAPS Oral Take 600 mg by mouth every morning.     Marland Kitchen HYDROCODONE-ACETAMINOPHEN 5-500 MG PO TABS Oral Take 1-2 tablets by mouth every 6 (six) hours as needed for pain. 20 tablet 0  . METOPROLOL SUCCINATE 12.5 MG HALF TABLET Oral Take 12.5 mg by mouth daily.    Marland Kitchen RENA-VITE PO TABS Oral Take 1 tablet by mouth daily.    Marland Kitchen PANTOPRAZOLE SODIUM 40 MG  PO TBEC Oral Take 40 mg by mouth daily at 12 noon.    Marland Kitchen VALACYCLOVIR HCL 1 G PO TABS Oral Take 1 tablet (1,000 mg total) by mouth 3 (three) times daily. 21 tablet 0    BP 109/70  Pulse 104  Temp 98.2 F (36.8 C) (Oral)  Resp 20  SpO2 100%  Physical Exam  Constitutional: She is oriented to person, place, and time. She appears well-developed and well-nourished.  HENT:  Head: Normocephalic and atraumatic.  Eyes: Pupils are equal, round, and reactive to light.  Neck: Normal range of motion. Neck supple.  Cardiovascular: Normal rate, regular rhythm and normal heart sounds.   Pulmonary/Chest: Effort normal and breath sounds normal. No respiratory distress. She has no wheezes. She has no rales. She exhibits no tenderness.  Abdominal: Soft. Bowel sounds are normal. There is no tenderness. There is no rebound and no guarding.  Musculoskeletal: Normal range of motion. She exhibits no edema.  Lymphadenopathy:    She has no cervical adenopathy.  Neurological: She is alert and oriented to person, place, and time.  Skin: Skin is warm and dry. Rash noted.       Patient has a rash noted on the medial side of the dorsum of her right foot extending up to her knee and there some patches in her inner thigh. It is raised and slightly erythematous with some vesicular lesions in patches as well as some scabbed over  areas. There is no warmth or signs of cellulitis. There's no petechiae or purpura. There is no drainage from the sites.  Psychiatric: She has a normal mood and affect.    ED Course  Procedures (including critical care time)  Labs Reviewed - No data to display No results found.   1. Shingles       MDM  The rash appears to be consistent with shingles. The only thing that goes against the shingles says that she had one patch on her left upper arm to look consistent with the other rash on her leg. Right now she doesn't have any rash on her left arm other than some scabbed over areas. I will go ahead and treat her for shingles as it looks classically like shingles on her lower leg. We'll start her on AS well as Vicodin for pain. Advised her followup with her doctor in the internal medicine clinic next week or return here as needed for any worsening symptoms        Malvin Johns, MD 09/28/11 1205

## 2011-09-30 ENCOUNTER — Emergency Department (HOSPITAL_COMMUNITY): Payer: Medicare Other

## 2011-09-30 ENCOUNTER — Encounter (HOSPITAL_COMMUNITY): Payer: Self-pay

## 2011-09-30 ENCOUNTER — Inpatient Hospital Stay (HOSPITAL_COMMUNITY)
Admission: EM | Admit: 2011-09-30 | Discharge: 2011-10-03 | DRG: 917 | Disposition: A | Discharge status: Home / Self Care | Payer: Medicare Other | Attending: Internal Medicine | Admitting: Internal Medicine

## 2011-09-30 DIAGNOSIS — R4182 Altered mental status, unspecified: Secondary | ICD-10-CM

## 2011-09-30 DIAGNOSIS — Z91041 Radiographic dye allergy status: Secondary | ICD-10-CM

## 2011-09-30 DIAGNOSIS — N039 Chronic nephritic syndrome with unspecified morphologic changes: Secondary | ICD-10-CM | POA: Diagnosis present

## 2011-09-30 DIAGNOSIS — D631 Anemia in chronic kidney disease: Secondary | ICD-10-CM | POA: Diagnosis present

## 2011-09-30 DIAGNOSIS — Z823 Family history of stroke: Secondary | ICD-10-CM

## 2011-09-30 DIAGNOSIS — Z79899 Other long term (current) drug therapy: Secondary | ICD-10-CM

## 2011-09-30 DIAGNOSIS — T375X1A Poisoning by antiviral drugs, accidental (unintentional), initial encounter: Secondary | ICD-10-CM | POA: Diagnosis present

## 2011-09-30 DIAGNOSIS — E119 Type 2 diabetes mellitus without complications: Secondary | ICD-10-CM | POA: Diagnosis present

## 2011-09-30 DIAGNOSIS — I498 Other specified cardiac arrhythmias: Secondary | ICD-10-CM | POA: Diagnosis present

## 2011-09-30 DIAGNOSIS — G609 Hereditary and idiopathic neuropathy, unspecified: Secondary | ICD-10-CM | POA: Diagnosis present

## 2011-09-30 DIAGNOSIS — N189 Chronic kidney disease, unspecified: Secondary | ICD-10-CM | POA: Diagnosis present

## 2011-09-30 DIAGNOSIS — Z833 Family history of diabetes mellitus: Secondary | ICD-10-CM

## 2011-09-30 DIAGNOSIS — B1920 Unspecified viral hepatitis C without hepatic coma: Secondary | ICD-10-CM | POA: Diagnosis present

## 2011-09-30 DIAGNOSIS — N186 End stage renal disease: Secondary | ICD-10-CM | POA: Diagnosis present

## 2011-09-30 DIAGNOSIS — N2581 Secondary hyperparathyroidism of renal origin: Secondary | ICD-10-CM | POA: Diagnosis present

## 2011-09-30 DIAGNOSIS — B028 Zoster with other complications: Secondary | ICD-10-CM | POA: Diagnosis present

## 2011-09-30 DIAGNOSIS — T3791XA Poisoning by unspecified systemic anti-infective and antiparasitics, accidental (unintentional), initial encounter: Secondary | ICD-10-CM | POA: Diagnosis present

## 2011-09-30 DIAGNOSIS — F19951 Other psychoactive substance use, unspecified with psychoactive substance-induced psychotic disorder with hallucinations: Secondary | ICD-10-CM | POA: Diagnosis present

## 2011-09-30 DIAGNOSIS — T375X4A Poisoning by antiviral drugs, undetermined, initial encounter: Principal | ICD-10-CM | POA: Diagnosis present

## 2011-09-30 DIAGNOSIS — I10 Essential (primary) hypertension: Secondary | ICD-10-CM | POA: Diagnosis present

## 2011-09-30 DIAGNOSIS — B029 Zoster without complications: Secondary | ICD-10-CM | POA: Diagnosis present

## 2011-09-30 DIAGNOSIS — R000 Tachycardia, unspecified: Secondary | ICD-10-CM | POA: Diagnosis present

## 2011-09-30 DIAGNOSIS — E876 Hypokalemia: Secondary | ICD-10-CM | POA: Diagnosis not present

## 2011-09-30 DIAGNOSIS — I120 Hypertensive chronic kidney disease with stage 5 chronic kidney disease or end stage renal disease: Secondary | ICD-10-CM | POA: Diagnosis present

## 2011-09-30 DIAGNOSIS — Z905 Acquired absence of kidney: Secondary | ICD-10-CM

## 2011-09-30 DIAGNOSIS — Z992 Dependence on renal dialysis: Secondary | ICD-10-CM

## 2011-09-30 DIAGNOSIS — Y92009 Unspecified place in unspecified non-institutional (private) residence as the place of occurrence of the external cause: Secondary | ICD-10-CM

## 2011-09-30 LAB — COMPREHENSIVE METABOLIC PANEL
AST: 17 U/L (ref 0–37)
CO2: 25 mEq/L (ref 19–32)
Calcium: 9.3 mg/dL (ref 8.4–10.5)
Creatinine, Ser: 7.23 mg/dL — ABNORMAL HIGH (ref 0.50–1.10)
GFR calc Af Amer: 6 mL/min — ABNORMAL LOW (ref >90)
GFR calc non Af Amer: 5 mL/min — ABNORMAL LOW (ref >90)
Glucose, Bld: 94 mg/dL (ref 70–99)

## 2011-09-30 LAB — CBC WITH DIFFERENTIAL/PLATELET
Basophils Absolute: 0.1 10*3/uL (ref 0.0–0.1)
Eosinophils Relative: 10 % — ABNORMAL HIGH (ref 0–5)
HCT: 30.7 % — ABNORMAL LOW (ref 36.0–46.0)
Lymphocytes Relative: 41 % (ref 12–46)
MCV: 82.7 fL (ref 78.0–100.0)
Monocytes Absolute: 0.6 10*3/uL (ref 0.1–1.0)
RDW: 16.3 % — ABNORMAL HIGH (ref 11.5–15.5)
WBC: 5.4 10*3/uL (ref 4.0–10.5)

## 2011-09-30 LAB — GLUCOSE, CAPILLARY: Glucose-Capillary: 81 mg/dL (ref 70–99)

## 2011-09-30 LAB — AMMONIA: Ammonia: 27 umol/L (ref 11–60)

## 2011-09-30 MED ORDER — SORBITOL 70 % SOLN: 30.0000 mL | Status: DC | PRN

## 2011-09-30 MED ORDER — LIDOCAINE HCL (PF) 1 % IJ SOLN: 5.0000 mL | INTRAMUSCULAR | Status: DC | PRN

## 2011-09-30 MED ORDER — GABAPENTIN 800 MG PO TABS
400.0000 mg | ORAL_TABLET | Freq: Three times a day (TID) | ORAL | Status: DC | PRN
  Filled 2011-09-30: qty 1

## 2011-09-30 MED ORDER — GABAPENTIN 400 MG PO CAPS
400.0000 mg | ORAL_CAPSULE | Freq: Three times a day (TID) | ORAL | Status: DC | PRN
  Filled 2011-09-30: qty 1

## 2011-09-30 MED ORDER — ACETAMINOPHEN 650 MG RE SUPP: 650.0000 mg | Freq: Four times a day (QID) | RECTAL | Status: DC | PRN

## 2011-09-30 MED ORDER — HYDROXYZINE HCL 25 MG PO TABS
25.0000 mg | ORAL_TABLET | Freq: Three times a day (TID) | ORAL | Status: DC | PRN
  Filled 2011-09-30: qty 1

## 2011-09-30 MED ORDER — CALCIUM CARBONATE 1250 MG/5ML PO SUSP: 500.0000 mg | Freq: Four times a day (QID) | ORAL | Status: DC | PRN

## 2011-09-30 MED ORDER — ACETAMINOPHEN 325 MG PO TABS
650.0000 mg | ORAL_TABLET | Freq: Four times a day (QID) | ORAL | Status: DC | PRN
  Administered 2011-09-30 – 2011-10-03 (×7): 650 mg via ORAL
  Filled 2011-09-30 (×8): qty 2

## 2011-09-30 MED ORDER — POLYETHYLENE GLYCOL 3350 17 G PO PACK
17.0000 g | PACK | Freq: Every day | ORAL | Status: DC
  Administered 2011-09-30 – 2011-10-03 (×4): 17 g via ORAL
  Filled 2011-09-30 (×4): qty 1

## 2011-09-30 MED ORDER — SODIUM CHLORIDE 0.9 % IJ SOLN: 3.0000 mL | INTRAMUSCULAR | Status: DC | PRN

## 2011-09-30 MED ORDER — METOPROLOL TARTRATE 1 MG/ML IV SOLN
2.5000 mg | Freq: Three times a day (TID) | INTRAVENOUS | Status: DC
  Filled 2011-09-30 (×3): qty 5

## 2011-09-30 MED ORDER — PANTOPRAZOLE SODIUM 40 MG IV SOLR
40.0000 mg | Freq: Every day | INTRAVENOUS | Status: DC
  Administered 2011-09-30: 40 mg via INTRAVENOUS
  Filled 2011-09-30 (×2): qty 40

## 2011-09-30 MED ORDER — METOPROLOL SUCCINATE 12.5 MG HALF TABLET
12.5000 mg | ORAL_TABLET | Freq: Every day | ORAL | Status: DC
  Administered 2011-09-30 – 2011-10-03 (×4): 12.5 mg via ORAL
  Filled 2011-09-30 (×5): qty 1

## 2011-09-30 MED ORDER — DARBEPOETIN ALFA-POLYSORBATE 25 MCG/0.42ML IJ SOLN
25.0000 ug | INTRAMUSCULAR | Status: DC
  Administered 2011-10-01: 25 ug via INTRAVENOUS
  Filled 2011-09-30: qty 0.42

## 2011-09-30 MED ORDER — LORAZEPAM 2 MG/ML IJ SOLN: 0.5000 mg | Freq: Four times a day (QID) | INTRAMUSCULAR | Status: DC | PRN

## 2011-09-30 MED ORDER — PENTAFLUOROPROP-TETRAFLUOROETH EX AERO: 1.0000 "application " | INHALATION_SPRAY | CUTANEOUS | Status: DC | PRN

## 2011-09-30 MED ORDER — ONDANSETRON HCL 4 MG/2ML IJ SOLN: 4.0000 mg | Freq: Four times a day (QID) | INTRAMUSCULAR | Status: DC | PRN

## 2011-09-30 MED ORDER — SODIUM CHLORIDE 0.9 % IV SOLN: 100.0000 mL | INTRAVENOUS | Status: DC | PRN

## 2011-09-30 MED ORDER — HEPARIN SODIUM (PORCINE) 1000 UNIT/ML DIALYSIS
1500.0000 [IU] | INTRAMUSCULAR | Status: DC | PRN
  Administered 2011-10-01: 1500 [IU] via INTRAVENOUS_CENTRAL
  Filled 2011-09-30: qty 2

## 2011-09-30 MED ORDER — DOCUSATE SODIUM 283 MG RE ENEM: 1.0000 | ENEMA | RECTAL | Status: DC | PRN

## 2011-09-30 MED ORDER — SODIUM CHLORIDE 0.9 % IJ SOLN
3.0000 mL | Freq: Two times a day (BID) | INTRAMUSCULAR | Status: DC
  Administered 2011-09-30 – 2011-10-02 (×5): 3 mL via INTRAVENOUS

## 2011-09-30 MED ORDER — HEPARIN SODIUM (PORCINE) 5000 UNIT/ML IJ SOLN
5000.0000 [IU] | Freq: Three times a day (TID) | INTRAMUSCULAR | Status: DC
  Administered 2011-09-30 – 2011-10-03 (×8): 5000 [IU] via SUBCUTANEOUS
  Filled 2011-09-30 (×11): qty 1

## 2011-09-30 MED ORDER — LIDOCAINE-PRILOCAINE 2.5-2.5 % EX CREA: 1.0000 "application " | TOPICAL_CREAM | CUTANEOUS | Status: DC | PRN

## 2011-09-30 MED ORDER — ALTEPLASE 2 MG IJ SOLR
2.0000 mg | Freq: Once | INTRAMUSCULAR | Status: AC | PRN
  Filled 2011-09-30: qty 2

## 2011-09-30 MED ORDER — SODIUM CHLORIDE 0.9 % IV SOLN: INTRAVENOUS | Status: AC

## 2011-09-30 MED ORDER — ZOLPIDEM TARTRATE 5 MG PO TABS
5.0000 mg | ORAL_TABLET | Freq: Every evening | ORAL | Status: DC | PRN
  Administered 2011-10-02 (×2): 5 mg via ORAL
  Filled 2011-09-30 (×2): qty 1

## 2011-09-30 MED ORDER — HEPARIN SODIUM (PORCINE) 1000 UNIT/ML DIALYSIS
1000.0000 [IU] | INTRAMUSCULAR | Status: DC | PRN
  Filled 2011-09-30: qty 1

## 2011-09-30 MED ORDER — ONDANSETRON HCL 4 MG PO TABS: 4.0000 mg | ORAL_TABLET | Freq: Four times a day (QID) | ORAL | Status: DC | PRN

## 2011-09-30 MED ORDER — NEPRO/CARBSTEADY PO LIQD: 237.0000 mL | Freq: Three times a day (TID) | ORAL | Status: DC | PRN

## 2011-09-30 MED ORDER — CAMPHOR-MENTHOL 0.5-0.5 % EX LOTN
1.0000 "application " | TOPICAL_LOTION | Freq: Three times a day (TID) | CUTANEOUS | Status: DC | PRN
  Filled 2011-09-30: qty 222

## 2011-09-30 NOTE — H&P (Signed)
Hospital Admission Note Date: 09/30/2011  Patient name: [name] Medical record number: [id number] Date of birth: Aug 21, 1950 Age: 61 y.o. Gender: female PCP: Ivor Costa, MD  Medical Service: Internal Medicine Teaching Service, [facility] Service  Attending physician:  Dr. Terressa Koyanagi    1st Contact: Dr. Clayburn Pert  Pager: [phone] 2nd Contact: Dr. Criss Alvine   Pager: [phone] After 5 pm or weekends: 1st Contact:      Pager: [phone] 2nd Contact:      Pager: [phone]  Chief Complaint: Altered mental status.  History of Present Illness: [name] is a 61 year old woman with a PMH significant for ESRD, hypertension, anemia of chronic renal disease, and sinus tachycardia who presents to the [facility] ED with a 1 day history of altered mental status.  She initially noticed a rash on Wednesday or Thursday per her daughters report.  The rash was also accompanied with a marked burning pain in the area of the rash.  She received her dialysis on Saturday as per her normal schedule and then presented to the [facility] emergency department and was diagnosed with shingles.  She was given a prescription for Valtrex 1g three times daily by mouth and Vicodin to take as needed for the pain.  She took the 3 doses Saturday per her daughters report and then when she woke up Sunday morning she was very confused and had several hallucinations.  Her daughter states that she was agitated, asking where she was and "talking nonsense."  Her daughter also reports that she was seeing faces coming out of the wall and that she was speaking to her mother who passed away in the 1s.  Her daughter denies any fevers, chills, nausea, vomiting, blood in her stool, urinary symptoms, or other ingestions including alcohol or other drugs.  They also have not noted any limp, facial droop, or lack of movement of any limbs.  [name] denies pain, numbness, or tingling to me today.  Throughout the interview she perseverated  on her previous answers and was also holding postures, keeping her mouth open after I checked her oropharynx and holding her arm up after I listened to her posterior lung fields.  [name] daughter states that at her baseline she is able to walk with a walker and has clear mentation and is functional with all her ADLs.   Meds: Current Outpatient Prescriptions on File Prior to Encounter  Medication Sig Dispense Refill  . B Complex-C-Zn-Folic Acid (DIALYVITE [id number] WITH ZINC) 0.8 MG TABS Take 1 tablet by mouth daily.      . darbepoetin (ARANESP) 200 MCG/0.4ML SOLN Inject 200 mcg into the skin every 7 (seven) days. Administered at dialysis per nephrology      . gabapentin (NEURONTIN) 300 MG capsule Take 600 mg by mouth every morning.       . metoprolol succinate (TOPROL-XL) 12.5 mg TB24 Take 12.5 mg by mouth daily.      . pantoprazole (PROTONIX) 40 MG tablet Take 40 mg by mouth daily at 12 noon.       Allergies: Allergies as of 09/30/2011 - Review Complete 09/30/2011  Allergen Reaction Noted  . Contrast media (iodinated diagnostic agents)  01/25/2011  . Iohexol Other (See Comments) 12/21/2003   Past Medical History  Diagnosis Date  . Seizures     r/t HTN in 1990's x 1  . Brain aneurysm     No records could be found  . Blood transfusion 1990's    r/t  Kidney removal surgery  . Arthritis     Back  . Anemia     anemia of chronic disease likely 2/2 ESRD per last anemia panel (02/2011) with Fe 35, TIBC 167, ferritin 2041  // BL Hgb 8-10  . Umbilical hernia age 49  . Heart murmur     Born with heart mumur, does not require follow up per pt  . Hypertension     Does not see a heart doctor, had pre transplant stress test [facility]    . End stage renal disease on dialysis     Secondary to hypertension // T/Th/Sat dialysis on [employer]  . Hepatitis C   . Colitis, ischemic 01/2011    S/P partial colectomy of right hemicolon and ileostomy placement  . Lumbar spinal stenosis       bilateral L4-5 and L5-S1 posterior lumbar interbody arthrodesis and bilateral L4-S1 posterior lateral arthrodesis for lumbar stenosis dynamic lumbar spondylolisthesis lumbar spondylosis lumbar radiculopathy with foot drop // s/p surgery 01/2011 - see surgery section for details.  Marland Kitchen Dysrhythmia     SVT for brief period in Feb 2013  . Dialysis patient     [facility]  . Diabetes mellitus     Borderline  . Foot drop, bilateral     after back surgery  . Anuria     Due to dialysis  . Aortic stenosis, mild     03/2011 echo   Past Surgical History  Procedure Date  . Nephrectomy 2010    right side done at [facility] on transplant list  . Umbilical hernia repair age 27- 50  . Appendectomy 1960's  . Vascular surgery     right arm dialysis graft  . Tonsillectomy     as a child  . Rib resection     d/t kidney removal  . Back surgery 01/30/2011    L4-S1 lumbar laminectomy facetectomy and foraminotomies for decompression a bilateral L4-5 and L5-S1 posterior lumbar interbody arthrodesis and bilateral L4-S1 posterior lateral arthrodesis for lumbar stenosis dynamic lumbar spondylolisthesis lumbar spondylosis lumbar radiculopathy with foot drop  . Laparotomy 02/09/2011    Procedure: EXPLORATORY LAPAROTOMY;  Surgeon: Harl Bowie, MD;  Location: [place];  Service: General;  Laterality: N/A;  . Partial colectomy 02/09/2011    Reason for surgery: ischemic colitis of right hemicolon; Surgeon: Harl Bowie, MD;  Location: [place];  Service: General;  Laterality: Right;  . Colostomy 02/09/2011    Reason for surgery: ischemic colitis of right hemicolon requiring partial colectomy and ileostomy.  Surgeon: Harl Bowie, MD;  Location: [place];  Service: General;  Laterality: Right;  . Colonoscopy 02/19/2011    Procedure: COLONOSCOPY;  Surgeon: Missy Sabins, MD;  Location: [place];  Service: Endoscopy;  Laterality: N/A;  . Colonoscopy 03/07/2011    Procedure:  COLONOSCOPY;  Surgeon: Cleotis Nipper, MD;  Location: [facility] ENDOSCOPY;  Service: Endoscopy;  Laterality: N/A;  no prep needed--pt has long Hartmann's pouch and ileostomy  . Total parathyroidectomy 01/17/2001    with autotransplantation into left forearm // due to secondary hyperparathyroidism from ESRD  . Av fistula placement 04/08/2011    Procedure: INSERTION OF ARTERIOVENOUS (AV) GORE-TEX GRAFT ARM;  Surgeon: Rosetta Posner, MD;  Location: [facility] OR;  Service: Vascular;  Laterality: Right;  Revision of Upper Arm Gore-Tex Graft  . Ileostomy closure 05/29/2011    Procedure: ILEOSTOMY TAKEDOWN;  Surgeon: Harl Bowie, MD;  Location: [facility];  Service: General;  Laterality:  N/A;   Family History  Problem Relation Age of Onset  . Anesthesia problems Neg Hx   . Hypotension Neg Hx   . Malignant hyperthermia Neg Hx   . Pseudochol deficiency Neg Hx   . Diabetes Father   . Stroke Mother    History   Social History  . Marital Status: Legally Separated    Spouse Name: N/A    Number of Children: 2  . Years of Education: 10th grade   Occupational History  . Retired     previously worked as a Producer, television/film/video History Main Topics  . Smoking status: Former Smoker -- 0.5 packs/day for 48 years    Types: Cigarettes    Quit date: 08/17/2010  . Smokeless tobacco: Never Used  . Alcohol Use: No     1 pint on Fridays  . Drug Use: No  . Sexually Active: No   Other Topics Concern  . Not on file   Social History Narrative   Lives in [place] with daughter. Has 2 On HD since for 20 years. Worked as a Sports coach. Went to 10th grade. Can read and write.    Review of Systems: Unable to assess due to patient's confusion.   Physical Exam: Blood pressure 151/90, pulse 107, temperature 98.1 F (36.7 C), temperature source Oral, resp. rate 15, height 5\' 5"  (1.651 m), weight 104 lb 11.5 oz (47.5 kg), SpO2 95.00%. Constitutional: Vital signs reviewed.  Patient is a well-developed and well-nourished woman in no  acute distress and cooperative with exam. Alert and oriented only to person consistently.  Transiently oriented to place. Head: Normocephalic and atraumatic Ear: TM normal bilaterally Mouth: no erythema or exudates, MMM Eyes: PERRL, EOMI, conjunctivae normal, No scleral icterus.  Neck: Supple, Trachea midline normal ROM, No JVD, mass, thyromegaly, or carotid bruit present.  Cardiovascular: tachycardic, S1 normal, S2 normal, 3/6 systolic murmur with radiation to the carotids.  no RG, pulses symmetric and intact bilaterally Pulmonary/Chest: CTAB, no wheezes, rales, or rhonchi Abdominal: Soft. Non-tender, non-distended, bowel sounds are normal, no masses, organomegaly, or guarding present.  GU: no CVA tenderness Musculoskeletal: No joint deformities, erythema, or stiffness, ROM full and no nontender Hematology: no cervical, inginal, or axillary adenopathy.  Neurological: A&O to self only.  Strength is normal and symmetric bilaterally, cranial nerve II-XII are grossly intact, no focal motor deficit, sensory intact to light touch bilaterally.  Skin: healing eschar's noted along the right shin and up to the lateral thigh.  No drainage or surrounding erythema.  Warm, dry and intact. No cyanosis, or clubbing.  Psychiatric: bizarre mood and labile affect. speech is stuttering and perseverates.  Behavior is abnormal.  Judgment and insight are impaired.  Thought content is abnormal with hallucinations.  Cognition is impaired and memory is impaired.    Lab results: Basic Metabolic Panel:  [facility] 09/30/11 0833  NA 139  K 3.5  CL 95*  CO2 25  GLUCOSE 94  BUN 38*  CREATININE 7.23*  CALCIUM 9.3  MG --  PHOS --   Liver Function Tests:  [facility] 09/30/11 0833  AST 17  ALT 11  ALKPHOS 128*  BILITOT 0.4  PROT 8.1  ALBUMIN 3.8    Basename 09/30/11 1240  AMMONIA 27   CBC:  Basename 09/30/11 0833  WBC 5.4  NEUTROABS 2.0  HGB 10.1*  HCT 30.7*  MCV 82.7  PLT 268   Imaging results:    Dg Chest 2 View  09/30/2011  *RADIOLOGY REPORT*  Clinical Data:  Altered mental status.  CHEST - 2 VIEW  Comparison: 04/03/2011  Findings: Heart is upper limits normal in size.  Linear scarring in the lung bases.  No acute opacities or effusions.  No acute bony abnormality.  IMPRESSION: Bibasilar scarring.  No acute findings.  Original Report Authenticated By: Raelyn Number, M.D.   Ct Head Wo Contrast  09/30/2011  *RADIOLOGY REPORT*  Clinical Data: Altered mental status.  Confusion.  Lethargy. Recent falls.  CT HEAD WITHOUT CONTRAST  Technique:  Contiguous axial images were obtained from the base of the skull through the vertex without contrast.  Comparison: 04/03/2011  Findings: The brain shows mild generalized atrophy.  There are areas of low density and hemispheric deep white matter consistent with chronic small vessel disease.  No evidence of acute infarction, mass lesion, hemorrhage, hydrocephalus or extra-axial collection.  No skull fracture.  Sinuses, middle ears and mastoids are clear.  IMPRESSION: No acute finding.  Atrophy and chronic small vessel change of the hemispheric white matter.  Original Report Authenticated By: Jules Schick, M.D.   Other results: EKG: Sinus rhythm, 1st degree AV block.  No ST segment changes or T wave changes.    Assessment & Plan by Problem: 1. Altered mental status:  Ms. Safarian presents with altered mental status since Sunday morning.  Differential diagnosis includes CVA, toxic ingestion, overdose, uremic symptoms, or encephalitis.  She has no other overt signs of stroke other then her confusion or the possible word finding.  She is moving all four limbs and has no apparent sensation defects.  She has no neck stiffness, fever, or leukocytosis which makes infective encephalitis much less likely.  Per the daughter's report the only new medication was the Vicodin and the Valtrex.  Since she is an ESRD patient her dose of Valtrex should have been 500 mg daily and  she took 3.5 g over the last few days which likely precipitated the altered mental status.   - Admit to med-surg  - close observation for safety  - Neuro checks  - Encourage eating and drinking  - Monitor AMS for clearance  - Holding Valtrex, opiates, and her home gabapentin  2.  ESRD:  She has not missed dialysis and is due for it tomorrow.  Renal was consulted and will follow and do dialysis as per her home schedule.  3.  Shingles Rash:   The rash is in the healing stage and is likely past the point where Valtrex will make any appreciable difference to the patient's symptoms.  We will observe and treat her pain symptomatically.  4.  Sinus tachycardia: Patient has been followed by cardiology in the past for her sinus tachycardia and is managed on Metoprolol.  We will treat her with IV metoprolol until her mental status clears.  5.  Anemia of chronic renal disease: Managed by nephrology.  She is currently not at transfusion level but would likely benefit from her darbapoetin injection and possible iron therapy.   6. VTE: Heparin  Signed: Daisi Kentner 09/30/2011, 2:24 PM

## 2011-09-30 NOTE — ED Notes (Signed)
MD at bedside. PAz PA

## 2011-09-30 NOTE — ED Provider Notes (Signed)
Medical screening examination/treatment/procedure(s) were conducted as a shared visit with non-physician practitioner(s) and myself.  I personally evaluated the patient during the encounter  Barbara Cower, MD 09/30/11 1526

## 2011-09-30 NOTE — ED Notes (Signed)
Daughter reports mother with confusion starting yesterday, lethargic, fell twice yesterday, talking to and seeing her mother, dx with shingles, Saturday, taking acyclovir and vicodin, dialysis T,TH.S

## 2011-09-30 NOTE — ED Notes (Signed)
Patient transported to CT 

## 2011-09-30 NOTE — ED Notes (Signed)
Returned from radiology att this time.

## 2011-09-30 NOTE — ED Notes (Signed)
Patient transported to X-ray 

## 2011-09-30 NOTE — Consult Note (Signed)
[facility] Renal Consultation Note  Indication for Consultation:  Management of ESRD/hemodialysis; anemia, hypertension/volume and secondary hyperparathyroidism  HPI: [name] is a 61 y.o.AA female with ESRD on chronic HD support at [facility] who presented to [facility] ED with a 1 day history of AMS. Of note, she was seen at the [place] [facility] on Saturday after HD and diagnosed with Shingles. She was placed on Valtrex 1gm po TID and Vicodin 5/500mg  prn pain. After taking three doses of Valtrex and Vicodin in addition to her previously prescribed Gabapentin 300mg  TID for worsening peripheral neuropathy, she awakened Sunday am confused and hallucinating. According to her daughter, "was calling me her mother". She currently remains confused but follows simple commands. CT head and CXR both negative. Renal consult for ongoing RRT. No urgent need for HD today noted and plans for HD tomorrow to continue her previous outpatient treatment regimen.    Dialysis Orders: Center: [place] on TTS. Dr. Marval Regal   EDW HD [place] 2K/2.25Ca+Time 3:30 Heparin 1.25ml (1500). Access RUA AVF BFR  DFR A1.5 Zemplar none mcg IV/HD Epogen 3,600 Units IV/HD  Venofer none Other UF profile 2  Past Medical History  Diagnosis Date  . Seizures     r/t HTN in 1990's x 1  . Brain aneurysm     No records could be found  . Blood transfusion 1990's    r/t Kidney removal surgery  . Arthritis     Back  . Anemia     anemia of chronic disease likely 2/2 ESRD per last anemia panel (02/2011) with Fe 35, TIBC 167, ferritin 2041  // BL Hgb 8-10  . Umbilical hernia age 45  . Heart murmur     Born with heart mumur, does not require follow up per pt  . Hypertension     Does not see a heart doctor, had pre transplant stress test at [facility]    . End stage renal disease on dialysis     Secondary to hypertension // T/Th/Sat dialysis on [employer]  . Hepatitis C   . Colitis, ischemic 01/2011    S/P partial  colectomy of right hemicolon and ileostomy placement  . Lumbar spinal stenosis      bilateral L4-5 and L5-S1 posterior lumbar interbody arthrodesis and bilateral L4-S1 posterior lateral arthrodesis for lumbar stenosis dynamic lumbar spondylolisthesis lumbar spondylosis lumbar radiculopathy with foot drop // s/p surgery 01/2011 - see surgery section for details.  Marland Kitchen Dysrhythmia     SVT for brief period in Feb 2013  . Dialysis patient     [place]  . Diabetes mellitus     Borderline  . Foot drop, bilateral     after back surgery  . Anuria     Due to dialysis  . Aortic stenosis, mild     03/2011 echo   Past Surgical History  Procedure Date  . Nephrectomy 2010    right side done at [facility] on transplant list  . Umbilical hernia repair age 89- 83  . Appendectomy 1960's  . Vascular surgery     right arm dialysis graft  . Tonsillectomy     as a child  . Rib resection     d/t kidney removal  . Back surgery 01/30/2011    L4-S1 lumbar laminectomy facetectomy and foraminotomies for decompression a bilateral L4-5 and L5-S1 posterior lumbar interbody arthrodesis and bilateral L4-S1 posterior lateral arthrodesis for lumbar stenosis dynamic lumbar spondylolisthesis lumbar spondylosis lumbar radiculopathy with  foot drop  . Laparotomy 02/09/2011    Procedure: EXPLORATORY LAPAROTOMY;  Surgeon: Harl Bowie, MD;  Location: [place];  Service: General;  Laterality: N/A;  . Partial colectomy 02/09/2011    Reason for surgery: ischemic colitis of right hemicolon; Surgeon: Harl Bowie, MD;  Location: [place];  Service: General;  Laterality: Right;  . Colostomy 02/09/2011    Reason for surgery: ischemic colitis of right hemicolon requiring partial colectomy and ileostomy.  Surgeon: Harl Bowie, MD;  Location: [place];  Service: General;  Laterality: Right;  . Colonoscopy 02/19/2011    Procedure: COLONOSCOPY;  Surgeon: Missy Sabins, MD;  Location: [place];   Service: Endoscopy;  Laterality: N/A;  . Colonoscopy 03/07/2011    Procedure: COLONOSCOPY;  Surgeon: Cleotis Nipper, MD;  Location: [facility] ENDOSCOPY;  Service: Endoscopy;  Laterality: N/A;  no prep needed--pt has long Hartmann's pouch and ileostomy  . Total parathyroidectomy 01/17/2001    with autotransplantation into left forearm // due to secondary hyperparathyroidism from ESRD  . Av fistula placement 04/08/2011    Procedure: INSERTION OF ARTERIOVENOUS (AV) GORE-TEX GRAFT ARM;  Surgeon: Rosetta Posner, MD;  Location: [facility] OR;  Service: Vascular;  Laterality: Right;  Revision of Upper Arm Gore-Tex Graft  . Ileostomy closure 05/29/2011    Procedure: ILEOSTOMY TAKEDOWN;  Surgeon: Harl Bowie, MD;  Location: [facility] OR;  Service: General;  Laterality: N/A;   Family History  Problem Relation Age of Onset  . Anesthesia problems Neg Hx   . Hypotension Neg Hx   . Malignant hyperthermia Neg Hx   . Pseudochol deficiency Neg Hx   . Diabetes Father   . Stroke Mother     reports that she quit smoking about 13 months ago. Her smoking use included Cigarettes. She has a 24 pack-year smoking history. She has never used smokeless tobacco. She reports that she does not drink alcohol or use illicit drugs. Allergies  Allergen Reactions  . Contrast Media (Iodinated Diagnostic Agents)     Seziure  . Iohexol Other (See Comments)    Reaction is convulsions   Prior to Admission medications   Medication Sig Start Date End Date Taking? Authorizing Provider  B Complex-C-Zn-Folic Acid (DIALYVITE Q000111Q WITH ZINC) 0.8 MG TABS Take 1 tablet by mouth daily. 06/20/11  Yes Ivor Costa, MD  darbepoetin (ARANESP) 200 MCG/0.4ML SOLN Inject 200 mcg into the skin every 7 (seven) days. Administered at dialysis per nephrology   Yes Historical Provider, MD  gabapentin (NEURONTIN) 300 MG capsule Take 600 mg by mouth every morning.  04/10/11  Yes Lou Cal, MD  HYDROcodone-acetaminophen (VICODIN) 5-500 MG per tablet Take 0.5-1 tablets  by mouth every 6 (six) hours as needed. For pain   Yes Historical Provider, MD  metoprolol succinate (TOPROL-XL) 12.5 mg TB24 Take 12.5 mg by mouth daily. 04/23/11  Yes Minus Breeding, MD  Multiple Vitamin (MULTIVITAMIN WITH MINERALS) TABS Take 1 tablet by mouth daily.   Yes Historical Provider, MD  pantoprazole (PROTONIX) 40 MG tablet Take 40 mg by mouth daily at 12 noon. 06/20/11 06/19/12 Yes Ivor Costa, MD  valACYclovir (VALTREX) 1000 MG tablet Take 500-1,000 mg by mouth 3 (three) times daily. For 7 days Started 7/13   Yes Historical Provider, MD   I have reviewed the patient's current medications. Results for orders placed during the hospital encounter of 09/30/11 (from the past 48 hour(s))  CBC WITH DIFFERENTIAL     Status: Abnormal   Collection Time  09/30/11  8:33 AM      Component Value Range Comment   WBC 5.4  4.0 - 10.5 K/uL    RBC 3.71 (*) 3.87 - 5.11 MIL/uL    Hemoglobin 10.1 (*) 12.0 - 15.0 g/dL    HCT 30.7 (*) 36.0 - 46.0 %    MCV 82.7  78.0 - 100.0 fL    MCH 27.2  26.0 - 34.0 pg    MCHC 32.9  30.0 - 36.0 g/dL    RDW 16.3 (*) 11.5 - 15.5 %    Platelets 268  150 - 400 K/uL    Neutrophils Relative 37 (*) 43 - 77 %    Neutro Abs 2.0  1.7 - 7.7 K/uL    Lymphocytes Relative 41  12 - 46 %    Lymphs Abs 2.2  0.7 - 4.0 K/uL    Monocytes Relative 11  3 - 12 %    Monocytes Absolute 0.6  0.1 - 1.0 K/uL    Eosinophils Relative 10 (*) 0 - 5 %    Eosinophils Absolute 0.6  0.0 - 0.7 K/uL    Basophils Relative 1  0 - 1 %    Basophils Absolute 0.1  0.0 - 0.1 K/uL   COMPREHENSIVE METABOLIC PANEL     Status: Abnormal   Collection Time   09/30/11  8:33 AM      Component Value Range Comment   Sodium 139  135 - 145 mEq/L    Potassium 3.5  3.5 - 5.1 mEq/L    Chloride 95 (*) 96 - 112 mEq/L    CO2 25  19 - 32 mEq/L    Glucose, Bld 94  70 - 99 mg/dL    BUN 38 (*) 6 - 23 mg/dL    Creatinine, Ser 7.23 (*) 0.50 - 1.10 mg/dL    Calcium 9.3  8.4 - 10.5 mg/dL    Total Protein 8.1  6.0 - 8.3 g/dL      Albumin 3.8  3.5 - 5.2 g/dL    AST 17  0 - 37 U/L HEMOLYSIS AT THIS LEVEL MAY AFFECT RESULT   ALT 11  0 - 35 U/L    Alkaline Phosphatase 128 (*) 39 - 117 U/L    Total Bilirubin 0.4  0.3 - 1.2 mg/dL    GFR calc non Af Amer 5 (*) >90 mL/min    GFR calc Af Amer 6 (*) >90 mL/min   AMMONIA     Status: Normal   Collection Time   09/30/11 12:40 PM      Component Value Range Comment   Ammonia 27  11 - 60 umol/L    ROS: as noted HPI; otherwise negative   Physical Exam: Filed Vitals:   09/30/11 1300  BP: 151/90  Pulse: 107  Temp: 98.1 F (36.7 C)  Resp: 15     General: frail, elderly female, no distress, calm HEENT: atraumatic, normocephalic Eyes: nonicteric Neck: supple Heart: RRR Lungs: CTAB, no adventitious B.S. Abdomen: soft, NT/ND, +BS Extremities: no ankle edema Skin: dried up crusting "shingles rash" top right LE and blisters intact right ankle/foot  Neuro: alert, opens eyes and follows simple commands; grossly intact but confused Dialysis Access: RUA AVG, +T/B  Assessment/Plan: 1. AMS- CT head chronic small vessel disease but negative acute; CXR negative acute opacities or effusions; history Hep C with ALT 11/ AST 17, ammonia level 27;  suspect medication induced (Valtrex 1gm TID; Gabapentin 300mg  TID w/600mg  taken on Saturday; and Vicodin 5/500mg  prn  pain) in light of decreased renal clearance; recommend continue holding meds then consider resuming at renal appropriate dosages 2. Shingles- if/when resumed, recommend Valtrex 500mg  daily after HD  3. Hypokalemia- K level 3.5 today; replete with HD tomorrow 4. Peripheral Neuropathy- on gabapentin; doesn't do well on large dosages; recommend no more than 100mg  TID 5. Chronic back pain- history Lumbar Spinal Stenosis 6. ESRD -  TTS [place]; HD tomorrow 7. Hypertension/volume/Aortic Stenosis/Murmur  -hypotensive episodes Tu and Thu per RN at outpt unit requiring 500NS bolus on Thu; will ^ eDW 0.5kg (50kg) 8. Anemia  - Hgb 10.1;  3,600 epogen outpt setting; continue by giving Aranesp 39mcg with Hd tomorrow and follow  9. Metabolic bone disease - s/p PTX; no Zemplar;  10. Nutrition - albumin 3.8; ^ protein renal carb modified diet  11. History ischemic cholitis s/o right hemicolectomy- requiring colectomy and ileostomy in the past; no BM since Thu per daughter with previously daily bowel regimen; suspect secondary pain meds; negative abd pain/tenderness; follow closely  12. DM- CBG and SSI 13. Disposition- per primary (teaching service); continue outpt HD TTS when discharged  Marni Griffon, [facility] Pager [phone] 09/30/2011, 2:40 PM   Attending Nephrologist: Dr. Roney Jaffe  Patient seen and examined and agree with assessment and plan as above with additions as indicated.  Kelly Splinter  MD [place] [phone] pgr    [phone] cell 09/30/2011, 5:12 PM

## 2011-09-30 NOTE — ED Provider Notes (Signed)
Medical screening examination/treatment/procedure(s) were conducted as a shared visit with non-physician practitioner(s) and myself.  I personally evaluated the patient during the encounter Dialysis pt.  Tu, th, sat.  Had dialysis last sat.  Due tomorrow for next tx. Usually does adls.  Now has incr. ams for a few days.  Cries out. Hallucinating about her dead mother.  [place] out of bed twice since ams began.  pe no distress. Contorted left side of face.  Cardiac murmur and abd bruit vs radiation of cardiac murmur.  abd soft and nt.   Will scan head, do cxr, and check blood.  Pt needs admission.   Barbara Cower, MD 09/30/11 1001

## 2011-09-30 NOTE — ED Provider Notes (Signed)
History     CSN: [id number]  Arrival date & time 09/30/11  0746   First MD Initiated Contact with Patient 09/30/11 0759      Chief Complaint  Patient presents with  . Altered Mental Status    (Consider location/radiation/quality/duration/timing/severity/associated sxs/prior treatment) HPI Comments: Patient is a 61 year old female with a history of CK D. with nephrectomy and dialysis Tuesday Thursday Saturday, hypertension, and possibly diabetes and hepatitis C that presents emergency department with a chief complaint of altered mental status.  Patient is brought by her daughter whom she lives with.  Patient was recently treated 2 days ago in the emergency department for shingles and has been taking Valtrex and Vicodin for pain.  Patient symptoms began Sunday morning around 6 a.m. and are described as gradually worsening confusion, lethargy, weakness, falls x2.  Daughter reports her mother has been increasingly disoriented and having visual and auditory hallucinations, speaking with her mother that has been deceased since 48.  Patient has not had sleep since Friday and has been restless.  At baseline the patient walks with a walker and has clear cognition.  Today patient has slurred speech & inability to ambulate due to weakness.  Daughter states that yesterday they called 91 and patient at that time was able to answer questions and was alert and oriented x3, but when she awoke this morning she asked her daughter where she was.  In addition she states that she has been crying since yesterday intermittently stating that she is having heart palpitations and she is extremely confused.  Daughter reports that this is very abnormal behavior for her mother.  Level V caveat. She is having normal BMs (illiostomy removed March 13th, placed s/p back surgery, note chroncic foot drop since surgery)  Patient is a 61 y.o. female presenting with altered mental status. The history is provided by a relative and  medical records.  Altered Mental Status    Past Medical History  Diagnosis Date  . Seizures     r/t HTN in 1990's x 1  . Brain aneurysm     No records could be found  . Blood transfusion 1990's    r/t Kidney removal surgery  . Arthritis     Back  . Anemia     anemia of chronic disease likely 2/2 ESRD per last anemia panel (02/2011) with Fe 35, TIBC 167, ferritin 2041  // BL Hgb 8-10  . Umbilical hernia age 68  . Heart murmur     Born with heart mumur, does not require follow up per pt  . Hypertension     Does not see a heart doctor, had pre transplant stress test at [facility]    . End stage renal disease on dialysis     Secondary to hypertension // T/Th/Sat dialysis on [employer]  . Hepatitis C   . Colitis, ischemic 01/2011    S/P partial colectomy of right hemicolon and ileostomy placement  . Lumbar spinal stenosis      bilateral L4-5 and L5-S1 posterior lumbar interbody arthrodesis and bilateral L4-S1 posterior lateral arthrodesis for lumbar stenosis dynamic lumbar spondylolisthesis lumbar spondylosis lumbar radiculopathy with foot drop // s/p surgery 01/2011 - see surgery section for details.  Marland Kitchen Dysrhythmia     SVT for brief period in Feb 2013  . Dialysis patient     [place]  . Diabetes mellitus     Borderline  . Foot drop, bilateral     after back surgery  .  Anuria     Due to dialysis  . Aortic stenosis, mild     03/2011 echo    Past Surgical History  Procedure Date  . Nephrectomy 2010    right side done at [facility] on transplant list  . Umbilical hernia repair age 67- 89  . Appendectomy 1960's  . Vascular surgery     right arm dialysis graft  . Tonsillectomy     as a child  . Rib resection     d/t kidney removal  . Back surgery 01/30/2011    L4-S1 lumbar laminectomy facetectomy and foraminotomies for decompression a bilateral L4-5 and L5-S1 posterior lumbar interbody arthrodesis and bilateral L4-S1 posterior lateral  arthrodesis for lumbar stenosis dynamic lumbar spondylolisthesis lumbar spondylosis lumbar radiculopathy with foot drop  . Laparotomy 02/09/2011    Procedure: EXPLORATORY LAPAROTOMY;  Surgeon: Harl Bowie, MD;  Location: [place];  Service: General;  Laterality: N/A;  . Partial colectomy 02/09/2011    Reason for surgery: ischemic colitis of right hemicolon; Surgeon: Harl Bowie, MD;  Location: [place];  Service: General;  Laterality: Right;  . Colostomy 02/09/2011    Reason for surgery: ischemic colitis of right hemicolon requiring partial colectomy and ileostomy.  Surgeon: Harl Bowie, MD;  Location: [place];  Service: General;  Laterality: Right;  . Colonoscopy 02/19/2011    Procedure: COLONOSCOPY;  Surgeon: Missy Sabins, MD;  Location: [place];  Service: Endoscopy;  Laterality: N/A;  . Colonoscopy 03/07/2011    Procedure: COLONOSCOPY;  Surgeon: Cleotis Nipper, MD;  Location: [facility] ENDOSCOPY;  Service: Endoscopy;  Laterality: N/A;  no prep needed--pt has long Hartmann's pouch and ileostomy  . Total parathyroidectomy 01/17/2001    with autotransplantation into left forearm // due to secondary hyperparathyroidism from ESRD  . Av fistula placement 04/08/2011    Procedure: INSERTION OF ARTERIOVENOUS (AV) GORE-TEX GRAFT ARM;  Surgeon: Rosetta Posner, MD;  Location: [facility] OR;  Service: Vascular;  Laterality: Right;  Revision of Upper Arm Gore-Tex Graft  . Ileostomy closure 05/29/2011    Procedure: ILEOSTOMY TAKEDOWN;  Surgeon: Harl Bowie, MD;  Location: [facility] OR;  Service: General;  Laterality: N/A;    Family History  Problem Relation Age of Onset  . Anesthesia problems Neg Hx   . Hypotension Neg Hx   . Malignant hyperthermia Neg Hx   . Pseudochol deficiency Neg Hx   . Diabetes Father   . Stroke Mother     History  Substance Use Topics  . Smoking status: Former Smoker -- 0.5 packs/day for 48 years    Types: Cigarettes    Quit date: 08/17/2010  . Smokeless tobacco: Never  Used  . Alcohol Use: No     1 pint on Fridays    OB History    Grav Para Term Preterm Abortions TAB SAB Ect Mult Living                  Review of Systems  Unable to perform ROS: Mental status change  Psychiatric/Behavioral: Positive for altered mental status.    Allergies  Contrast media and Iohexol  Home Medications   Current Outpatient Rx  Name Route Sig Dispense Refill  . DIALYVITE 800/ZINC 0.8 MG PO TABS Oral Take 1 tablet by mouth daily.    Marland Kitchen DARBEPOETIN ALFA-POLYSORBATE 200 MCG/0.4ML IJ SOLN Subcutaneous Inject 200 mcg into the skin every 7 (seven) days. Administered at dialysis per nephrology    . GABAPENTIN 300 MG PO CAPS Oral Take  600 mg by mouth every morning.     Marland Kitchen HYDROCODONE-ACETAMINOPHEN 5-500 MG PO TABS Oral Take 0.5-1 tablets by mouth every 6 (six) hours as needed. For pain    . METOPROLOL SUCCINATE 12.5 MG HALF TABLET Oral Take 12.5 mg by mouth daily.    . ADULT MULTIVITAMIN W/MINERALS CH Oral Take 1 tablet by mouth daily.    Marland Kitchen PANTOPRAZOLE SODIUM 40 MG PO TBEC Oral Take 40 mg by mouth daily at 12 noon.    Marland Kitchen VALACYCLOVIR HCL 1 G PO TABS Oral Take 500-1,000 mg by mouth 3 (three) times daily. For 7 days Started 7/13      BP 133/92  Pulse 95  Temp 97.5 F (36.4 C) (Axillary)  Resp 23  SpO2 96%  Physical Exam  Nursing note and vitals reviewed. Constitutional: She appears well-developed and well-nourished. No distress.  HENT:  Head: Normocephalic and atraumatic.  Eyes: Conjunctivae and EOM are normal. Pupils are equal, round, and reactive to light. No scleral icterus.  Neck: Normal range of motion. Neck supple.  Cardiovascular: Intact distal pulses.        Tachycardic, loud systolic ejection murmur.   Pulmonary/Chest: Effort normal. No respiratory distress. She has no wheezes. She has no rales. She exhibits no tenderness.  Abdominal: Soft. There is no tenderness.       Midline surgical scar, soft non tender.   Musculoskeletal: She exhibits no edema  and no tenderness.  Neurological: She is alert. She displays a negative Romberg sign.       No facial paralysis. Pt slurring speech, Patient is easily arroused with voice and oriented to self. Unable to asses cranial nerves d/t pts inability to follow commands. Pupils equal & constricted. Able to perform strength testing: 5/5 bilaterally (grip strength, hamstring, biceps)   Skin: Skin is warm and dry. Rash noted. No petechiae and no purpura noted. She is not diaphoretic.       Crusted erythematous rash located on anterior right lower extremity along L4 dermatome with a pustule at the foot. No extremity warmth.    Psychiatric: Her speech is not slurred. Cognition and memory are impaired.    ED Course  Procedures (including critical care time)  Labs Reviewed  CBC WITH DIFFERENTIAL - Abnormal; Notable for the following:    RBC 3.71 (*)     Hemoglobin 10.1 (*)     HCT 30.7 (*)     RDW 16.3 (*)     Neutrophils Relative 37 (*)     Eosinophils Relative 10 (*)     All other components within normal limits  COMPREHENSIVE METABOLIC PANEL - Abnormal; Notable for the following:    Chloride 95 (*)     BUN 38 (*)     Creatinine, Ser 7.23 (*)     Alkaline Phosphatase 128 (*)     GFR calc non Af Amer 5 (*)     GFR calc Af Amer 6 (*)     All other components within normal limits  AMMONIA   Dg Chest 2 View  09/30/2011  *RADIOLOGY REPORT*  Clinical Data: Altered mental status.  CHEST - 2 VIEW  Comparison: 04/03/2011  Findings: Heart is upper limits normal in size.  Linear scarring in the lung bases.  No acute opacities or effusions.  No acute bony abnormality.  IMPRESSION: Bibasilar scarring.  No acute findings.  Original Report Authenticated By: Raelyn Number, M.D.   Ct Head Wo Contrast  09/30/2011  *RADIOLOGY REPORT*  Clinical Data: Altered  mental status.  Confusion.  Lethargy. Recent falls.  CT HEAD WITHOUT CONTRAST  Technique:  Contiguous axial images were obtained from the base of the skull  through the vertex without contrast.  Comparison: 04/03/2011  Findings: The brain shows mild generalized atrophy.  There are areas of low density and hemispheric deep white matter consistent with chronic small vessel disease.  No evidence of acute infarction, mass lesion, hemorrhage, hydrocephalus or extra-axial collection.  No skull fracture.  Sinuses, middle ears and mastoids are clear.  IMPRESSION: No acute finding.  Atrophy and chronic small vessel change of the hemispheric white matter.  Original Report Authenticated By: Jules Schick, M.D.     No diagnosis found.   Date: 09/30/2011  Rate: 95  Rhythm: normal sinus rhythm  QRS Axis: left, intermediate  Intervals: PR prolonged  ST/T Wave abnormalities: normal  Conduction Disutrbances:first-degree A-V block   Narrative Interpretation:   Old EKG Reviewed: changes noted    MDM  Altered mental status, Pt was started on Valtrex [phone] TID x 7 days on Sat. Question valtrex overdose d/t not being renally dosed (pt w CKD last dialysis sat).  Labs and imaging reviewed.  Patient to be admitted for ongoing motor status and further evaluation.The patient appears reasonably stabilized for admission considering the current resources, flow, and capabilities available in the ED at this time, and I doubt any other [facility] requiring further screening and/or treatment in the ED prior to admission.  Ammonia pending.  Patient to be transferred to CDU pending bed availability.         Verl Dicker, PA-C 09/30/11 [address], PA-C 09/30/11 1108

## 2011-09-30 NOTE — ED Notes (Signed)
Lab at bedside

## 2011-10-01 ENCOUNTER — Inpatient Hospital Stay (HOSPITAL_COMMUNITY): Payer: Medicare Other

## 2011-10-01 DIAGNOSIS — B029 Zoster without complications: Secondary | ICD-10-CM

## 2011-10-01 DIAGNOSIS — N186 End stage renal disease: Secondary | ICD-10-CM

## 2011-10-01 DIAGNOSIS — R4182 Altered mental status, unspecified: Secondary | ICD-10-CM

## 2011-10-01 DIAGNOSIS — T375X1A Poisoning by antiviral drugs, accidental (unintentional), initial encounter: Secondary | ICD-10-CM | POA: Diagnosis present

## 2011-10-01 DIAGNOSIS — T375X4A Poisoning by antiviral drugs, undetermined, initial encounter: Principal | ICD-10-CM

## 2011-10-01 DIAGNOSIS — T50901A Poisoning by unspecified drugs, medicaments and biological substances, accidental (unintentional), initial encounter: Secondary | ICD-10-CM

## 2011-10-01 DIAGNOSIS — T50904A Poisoning by unspecified drugs, medicaments and biological substances, undetermined, initial encounter: Secondary | ICD-10-CM

## 2011-10-01 LAB — BASIC METABOLIC PANEL
BUN: 46 mg/dL — ABNORMAL HIGH (ref 6–23)
Chloride: 95 mEq/L — ABNORMAL LOW (ref 96–112)
GFR calc non Af Amer: 4 mL/min — ABNORMAL LOW (ref >90)
Glucose, Bld: 83 mg/dL (ref 70–99)
Potassium: 3.8 mEq/L (ref 3.5–5.1)

## 2011-10-01 LAB — CBC
HCT: 32.3 % — ABNORMAL LOW (ref 36.0–46.0)
Hemoglobin: 10.8 g/dL — ABNORMAL LOW (ref 12.0–15.0)
MCHC: 33.4 g/dL (ref 30.0–36.0)

## 2011-10-01 MED ORDER — GABAPENTIN 100 MG PO CAPS
100.0000 mg | ORAL_CAPSULE | Freq: Three times a day (TID) | ORAL | Status: DC
  Administered 2011-10-01 – 2011-10-03 (×6): 100 mg via ORAL
  Filled 2011-10-01 (×10): qty 1

## 2011-10-01 MED ORDER — PANTOPRAZOLE SODIUM 40 MG PO TBEC
40.0000 mg | DELAYED_RELEASE_TABLET | Freq: Every day | ORAL | Status: DC
  Administered 2011-10-02 – 2011-10-03 (×2): 40 mg via ORAL
  Filled 2011-10-01 (×2): qty 1

## 2011-10-01 MED ORDER — DARBEPOETIN ALFA-POLYSORBATE 25 MCG/0.42ML IJ SOLN
INTRAMUSCULAR | Status: AC
  Administered 2011-10-01: 25 ug via INTRAVENOUS
  Filled 2011-10-01: qty 0.42

## 2011-10-01 MED ORDER — ACETAMINOPHEN 325 MG PO TABS
650.0000 mg | ORAL_TABLET | Freq: Once | ORAL | Status: AC
  Administered 2011-10-01: 650 mg via ORAL

## 2011-10-01 NOTE — Progress Notes (Signed)
Subjective:  More awake, alert but remains sedate; answers simple questions appropriately (oriented person/place/and only slightly off time)  Vital signs in last 24 hours: Filed Vitals:   09/30/11 1300 09/30/11 1642 09/30/11 2103 10/01/11 0618  BP: 151/90 148/87 138/89 162/88  Pulse: 107 101 93 100  Temp: 98.1 F (36.7 C) 98 F (36.7 C) 97.7 F (36.5 C) 98.4 F (36.9 C)  TempSrc: Oral Oral Oral Oral  Resp: 15 17 18 18   Height: 5\' 5"  (1.651 m)  5\' 5"  (1.651 m)   Weight: 47.5 kg (104 lb 11.5 oz)  47.8 kg (105 lb 6.1 oz)   SpO2: 95% 96% 93% 99%   Weight change:  No intake or output data in the 24 hours ending 10/01/11 0950 Labs: Basic Metabolic Panel:  Lab [id number] 0750 09/30/11 0833  NA 137 139  K 3.8 3.5  CL 95* 95*  CO2 21 25  GLUCOSE 83 94  BUN 46* 38*  CREATININE 9.14* 7.23*  CALCIUM 8.8 9.3  ALB -- --  PHOS -- --   Liver Function Tests:  Lab 09/30/11 0833  AST 17  ALT 11  ALKPHOS 128*  BILITOT 0.4  PROT 8.1  ALBUMIN 3.8   No results found for this basename: LIPASE:3,AMYLASE:3 in the last 168 hours  Lab 09/30/11 1240  AMMONIA 27   CBC:  Lab 10/01/11 0750 09/30/11 0833  WBC 5.7 5.4  NEUTROABS -- 2.0  HGB 10.8* 10.1*  HCT 32.3* 30.7*  MCV 81.8 82.7  PLT 271 268   Cardiac Enzymes: No results found for this basename: CKTOTAL:5,CKMB:5,CKMBINDEX:5,TROPONINI:5 in the last 168 hours CBG:  Lab 10/01/11 0756 09/30/11 1649  GLUCAP 85 81    Iron Studies: No results found for this basename: IRON,TIBC,TRANSFERRIN,FERRITIN in the last 72 hours Studies/Results: Dg Chest 2 View  09/30/2011  *RADIOLOGY REPORT*  Clinical Data: Altered mental status.  CHEST - 2 VIEW  Comparison: 04/03/2011  Findings: Heart is upper limits normal in size.  Linear scarring in the lung bases.  No acute opacities or effusions.  No acute bony abnormality.  IMPRESSION: Bibasilar scarring.  No acute findings.  Original Report Authenticated By: Raelyn Number, M.D.   Ct Head Wo  Contrast  09/30/2011  *RADIOLOGY REPORT*  Clinical Data: Altered mental status.  Confusion.  Lethargy. Recent falls.  CT HEAD WITHOUT CONTRAST  Technique:  Contiguous axial images were obtained from the base of the skull through the vertex without contrast.  Comparison: 04/03/2011  Findings: The brain shows mild generalized atrophy.  There are areas of low density and hemispheric deep white matter consistent with chronic small vessel disease.  No evidence of acute infarction, mass lesion, hemorrhage, hydrocephalus or extra-axial collection.  No skull fracture.  Sinuses, middle ears and mastoids are clear.  IMPRESSION: No acute finding.  Atrophy and chronic small vessel change of the hemispheric white matter.  Original Report Authenticated By: Jules Schick, M.D.   Medications:      . sodium chloride   Intravenous STAT  . darbepoetin (ARANESP) injection - DIALYSIS  25 mcg Intravenous Q Tue-HD  . gabapentin  100 mg Oral TID  . heparin  5,000 Units Subcutaneous Q8H  . metoprolol succinate  12.5 mg Oral Daily  . pantoprazole  40 mg Oral Q1200  . polyethylene glycol  17 g Oral Daily  . sodium chloride  3 mL Intravenous Q12H  . DISCONTD: metoprolol  2.5-5 mg Intravenous Q8H  . DISCONTD: pantoprazole (PROTONIX) IV  40 mg Intravenous QHS  I  have reviewed scheduled and prn medications.  General: comfortable Heart: RRR  Lungs: CTAB, no adventitious B.S.  Abdomen: soft, NT/ND, +BS  Extremities: no ankle edema  Skin: dried up crusting "shingles rash" top right LE and blisters intact right ankle/foot  Dialysis Access: RUA AVG, +T/B   Assessment/Plan:  1. AMS- improving; suspect medication induced in light of decreased renal clearance (see initial consult note) recommend continue holding meds then consider resuming at renal appropriate dosages 2. Shingles- if/when resumed, recommend Valtrex 500mg  daily after HD; on droplet isolation  3. Peripheral Neuropathy- on gabapentin; doesn't do well on  large dosages; currently on 100mg  TID; follow closely 4. ESRD - TTS [place]; HD today 5. Hypertension/volume/Aortic Stenosis/Murmur -BP above goal; on Metoprolol XL 12.5mg  qd; recent hypotensive episodes during HD; suspect will need ^ eDW 0.5kg (50kg) 6. Anemia - Hgb 10.8; Aranesp 73mcg today then wkly; follow  7. Metabolic bone disease - s/p PTX; no Zemplar;  8. History ischemic cholitis s/o right hemicolectomy- requiring colectomy and ileostomy in the past; no BM since Thu per daughter with previously daily bowel regimen; suspect secondary pain meds; negative abd pain/tenderness; follow closely  9. DM- CBG and SSI; glucose 83 this am  Linna Hoff, P.A. [facility]  10/01/2011, 11:00 AM  Patient seen and examined and agree with assessment and plan as above.  Kelly Splinter  MD [facility] [phone] pgr    [phone] cell 10/01/2011, 12:39 PM

## 2011-10-01 NOTE — Progress Notes (Signed)
Resident Co-sign Daily Note: I have seen the patient and reviewed the daily progress note by Dewitt Hoes and discussed the care of the patient with her.  See below for documentation of my findings, assessment, and plans.  Subjective: Still with confusion and difficulty finding words.   Objective: Vital signs in last 24 hours: Filed Vitals:   09/30/11 1642 09/30/11 2103 10/01/11 0618 10/01/11 1100  BP: 148/87 138/89 162/88 118/78  Pulse: 101 93 100 86  Temp: 98 F (36.7 C) 97.7 F (36.5 C) 98.4 F (36.9 C) 97 F (36.1 C)  TempSrc: Oral Oral Oral Oral  Resp: 17 18 18 18   Height:  5\' 5"  (1.651 m)    Weight:  105 lb 6.1 oz (47.8 kg)    SpO2: 96% 93% 99% 96%   Physical Exam: Vitals reviewed. General: Sitting up in bed, NAD HEENT: PERRL, EOMI, no scleral icterus Cardiac: RRR, 3/6 systolic murmur w/ radiation to carotids Pulm: Clear to auscultation bilaterally, no wheezes, rales, or rhonchi Abd: Soft, nontender, nondistended, BS present Ext: Warm and well perfused, no pedal edema, healing shingles rash on LLE Neuro: Alert and oriented to person, city, and Software engineer of the [place]. Able to follow simple directions.  Lab Results: Reviewed and documented in Electronic Record Micro Results: Reviewed and documented in Electronic Record Studies/Results: Reviewed and documented in Electronic Record Medications: I have reviewed the patient's current medications. Scheduled Meds:   . sodium chloride   Intravenous STAT  . darbepoetin (ARANESP) injection - DIALYSIS  25 mcg Intravenous Q Tue-HD  . gabapentin  100 mg Oral TID  . heparin  5,000 Units Subcutaneous Q8H  . metoprolol succinate  12.5 mg Oral Daily  . pantoprazole  40 mg Oral Q1200  . polyethylene glycol  17 g Oral Daily  . sodium chloride  3 mL Intravenous Q12H  . DISCONTD: metoprolol  2.5-5 mg Intravenous Q8H  . DISCONTD: pantoprazole (PROTONIX) IV  40 mg Intravenous QHS   Continuous Infusions:  PRN Meds:.sodium chloride,  acetaminophen, acetaminophen, alteplase, calcium carbonate (dosed in mg elemental calcium), camphor-menthol, docusate sodium, feeding supplement (NEPRO CARB STEADY), heparin, heparin, hydrOXYzine, lidocaine, lidocaine-prilocaine, LORazepam, ondansetron (ZOFRAN) IV, ondansetron, pentafluoroprop-tetrafluoroeth, sodium chloride, sorbitol, zolpidem, DISCONTD: gabapentin, DISCONTD: gabapentin, DISCONTD: gabapentin Assessment/Plan:  61yo F with ESRD on HD and shingles, presented with AMS after taking high dose of Valtrex prescribed for her shingles outbreak.   1. Altered mental status: AMS improving. She is little less confused than yesterday, alert and oriented to person, city, and President of the [place], and is able to follow simple directions. No fevers, chills, n/v, urinary symptoms, fall/trauma,focal deficits,  neck stiffness or seizure activity noted. Her admision chest x-ray /CT-head without acute findings, and labs stable. AMS likely from the Valtrex 1000 mg TID and Gabapentin 300mg  TID, given her ESRD.  - Stopped Valtrex - Gabapentin changed to 100mg  tid - Will continue to monitor - Neuro checks every 4 hrs  - Diet- Renal and Carb modified   2. ESRD: Nephrology consulted yesterday. Did not recommend urgent HD. Pt is schedule for HD today. Her usual schedule is TTS. Cr 9.14<7.23  3. Shingles: The rash is healing well. Will continue to observe and treat her pain symptomatically.  -Airborn/ contact isolation for active Shingles (one vesicles)  -Continue acetaminophen PRN  -Gabapentin 100mg  TID   5. Sinus tachycardia: Patient has been followed by Cardiology in the past for her tachycardia and managed with Metoprolol. Tachycardia has been mild. - Continue home medication of oral  Metoprolol-XL daily  6. Anemia of chronic renal disease: Managed by nephrology. She is currently not at transfusion level but would likely benefit from her Darbepoetin (Aranesp) injection and possible iron therapy. H&H  10.8/32.3.  7. VTE prophylaxis: [place] Heparin      LOS: 1 day   Otho Bellows 10/01/2011, 12:26 PM

## 2011-10-01 NOTE — H&P (Signed)
Internal Medicine Attending Admission Note Date: 10/01/2011  Patient name: MALVA THANG Medical record number: [id number] Date of birth: 03-12-1951 Age: 61 y.o. Gender: female  I saw and evaluated the patient. I reviewed the resident's note and I agree with the resident's findings and plan as documented in the resident's note.  Chief Complaint(s): Acute mental status change.  History - key components related to admission:  Ms. Billett is a 61 year old woman with a history of end-stage renal disease requiring chronic hemodialysis, anemia of chronic renal disease, hypertension, and lumbar stenosis who presents with a one-day history of altered mental status and acute delirium. 5 days prior to admission she developed the onset of a vesicular rash. This rash was accompanied by a burning pain. She presented to the [facility] emergency department on Saturday and was diagnosed with herpes zoster. She was given a prescription for Valtrex 1 g three times daily by mouth and vicodin for pain. She took 3 doses of the Valtrex on Saturday (3 gms). When she awoke Sunday morning she was agitated and had visual hallucinations including seeing faces coming out of the wall. Her daughter denied any fevers, shakes, chills, nausea, vomiting, or other complaints prior to Sunday morning. She was therefore brought to the emergency department for further evaluation.  Physical Exam - key components related to admission:  Filed Vitals:   10/01/11 1430 10/01/11 1445 10/01/11 1500 10/01/11 1515  BP: 117/74 108/79 110/70 122/70  Pulse: 101 94 101 97  Temp:      TempSrc:      Resp: 19     Height:      Weight:      SpO2:       General:  Well-developed, well-nourished, woman lying comfortably in bed in no acute distress. She was alert and oriented to person and city. She knew she was in the hospital but could not give the name of the hospital. Lungs: Clear to auscultation bilaterally without wheezes, rhonchi,  or rales. Heart: Regular rate and rhythm with a 2/6 systolic murmur at the left upper sternal border radiating to the carotids. Abdomen: Soft, nontender, active bowel sounds. Extremities: Without edema. The right leg had multiple scabbed over lesions that appeared to be in the L4 distribution. There were no new vesicles. Neuro: Moves all 4 extremities spontaneously.  Lab results:  Basic Metabolic Panel:  Basename 10/01/11 0750 09/30/11 0833  NA 137 139  K 3.8 3.5  CL 95* 95*  CO2 21 25  GLUCOSE 83 94  BUN 46* 38*  CREATININE 9.14* 7.23*  CALCIUM 8.8 9.3  MG -- --  PHOS -- --   Liver Function Tests:  [facility] 09/30/11 0833  AST 17  ALT 11  ALKPHOS 128*  BILITOT 0.4  PROT 8.1  ALBUMIN 3.8   CBC:  Basename 10/01/11 0750 09/30/11 0833  WBC 5.7 5.4  NEUTROABS -- 2.0  HGB 10.8* 10.1*  HCT 32.3* 30.7*  MCV 81.8 82.7  PLT 271 268   Thyroid Function Tests:  Basename 09/30/11 1408  TSH 1.902  T4TOTAL --  FREET4 --  T3FREE --  THYROIDAB --   Imaging results:  Dg Chest 2 View  09/30/2011  *RADIOLOGY REPORT*  Clinical Data: Altered mental status.  CHEST - 2 VIEW  Comparison: 04/03/2011  Findings: Heart is upper limits normal in size.  Linear scarring in the lung bases.  No acute opacities or effusions.  No acute bony abnormality.  IMPRESSION: Bibasilar scarring.  No acute findings.  Original  Report Authenticated By: Raelyn Number, M.D.   Ct Head Wo Contrast  09/30/2011  *RADIOLOGY REPORT*  Clinical Data: Altered mental status.  Confusion.  Lethargy. Recent falls.  CT HEAD WITHOUT CONTRAST  Technique:  Contiguous axial images were obtained from the base of the skull through the vertex without contrast.  Comparison: 04/03/2011  Findings: The brain shows mild generalized atrophy.  There are areas of low density and hemispheric deep white matter consistent with chronic small vessel disease.  No evidence of acute infarction, mass lesion, hemorrhage, hydrocephalus or extra-axial  collection.  No skull fracture.  Sinuses, middle ears and mastoids are clear.  IMPRESSION: No acute finding.  Atrophy and chronic small vessel change of the hemispheric white matter.  Original Report Authenticated By: Jules Schick, M.D.   Other results:  EKG: normal sinus rhythm at 95 beats per minute, normal axis, first-degree AV block, possible placement issue in lead V3, nonspecific ST-T changes.  Assessment & Plan by Problem:  Ms. Grelle is a 61 year old woman with a history of chronic renal failure requiring hemodialysis who presents with the acute onset of delirium. She was recently diagnosed with herpes zoster and was placed on Valtrex at a dose that was not renally adjusted. She therefore received 6 times the recommended dose. Valtrex has been associated with agitation and confusion and this is likely to be the cause of her acute delirium. Although other toxicities are possible as are infections such as herpes they are much less likely given the association with the Valtrex dose.  1) Acute toxic delirium: Likely secondary to Valtrex overdose. We will hold the Valtrex and decrease the gabapentin to 100 mg by mouth 3 times a day upon discharge.. The gabapentin and narcotics are also being held acutely. We will continue to observe her looking for improvement over time.  2) Herpes zoster: No new vesicular lesions. We will therefore follow clinically.  3) Chronic renal failure requiring hemodialysis: We will notify nephrology the patient has been admitted and will ask to dialyze her as appropriate.

## 2011-10-01 NOTE — Progress Notes (Signed)
Pt is getting HEMO

## 2011-10-01 NOTE — Progress Notes (Signed)
Patient is receiving Protonix by the IV route.  Pt meets the P & T approved criteria for changing to oral administration.  - No GI bleeding  - Tolerating an oral or per tube diet  - Taking other oral or per tube medications.  Will change patient to Protonix 40mg  PO daily per P&T policy.  Thank you. [employer], Pharm.D., BCPS Clinical Pharmacist Pager [phone]

## 2011-10-01 NOTE — Care Management Note (Signed)
    Page 1 of 1   10/01/2011     3:07:33 PM   CARE MANAGEMENT NOTE 10/01/2011  Patient:  [name], [name]   Account Number:  [id number]  Date Initiated:  10/01/2011  Documentation initiated by:  Georgana Curio  Subjective/Objective Assessment:   Pt is 61 yr old admitted with altered mental status     Action/Plan:   Continue to follow for CM/discharge planning needs   Anticipated DC Date:  10/03/2011   Anticipated DC Plan:  [place]  CM consult      Choice offered to / List presented to:             Status of service:  In process, will continue to follow Medicare Important Message given?   (If response is "NO", the following Medicare IM given date fields will be blank) Date Medicare IM given:   Date Additional Medicare IM given:    Discharge Disposition:    Per UR Regulation:  Reviewed for med. necessity/level of care/duration of stay  If discussed at [place] of Stay Meetings, dates discussed:    Comments:

## 2011-10-01 NOTE — Progress Notes (Signed)
Medical Student Daily Progress Note  Subjective:  Pt was having breakfast when I checked her in the room. She is little irritable but less confuse than yesterday. She was being able to follow simple commands. She answered some questions irreverently. She is c/o burning pain in her right legs. No fever, N/V, urinary symptoms, diarrhea, fall/trauma, lack of movement of any limbs or seizure activity since admission. Pt has schedule for HD today.  Objective: Vital signs in last 24 hours: Filed Vitals:   09/30/11 1300 09/30/11 1642 09/30/11 2103 10/01/11 0618  BP: 151/90 148/87 138/89 162/88  Pulse: 107 101 93 100  Temp: 98.1 F (36.7 C) 98 F (36.7 C) 97.7 F (36.5 C) 98.4 F (36.9 C)  TempSrc: Oral Oral Oral Oral  Resp: 15 17 18 18   Height: 5\' 5"  (1.651 m)  5\' 5"  (1.651 m)   Weight: 47.5 kg (104 lb 11.5 oz)  47.8 kg (105 lb 6.1 oz)   SpO2: 95% 96% 93% 99%   Weight change:  No intake or output data in the 24 hours ending 10/01/11 1117  Physical Exam:  General: Pt is not in acute distress. Mild confuse. HEENT: PERRL, EOMI, no scleral icterus Cardiac: Tachycardic, S1 normal, S2 normal, 3/6 systolic murmur with radiation to the carotids. no RG, pulses symmetric and intact bilaterally Pulm: clear to auscultation bilaterally, moving normal volumes of air Abd: soft, nontender, nondistended, BS present Ext: warm and well perfused, no pedal edema Skin: Rash along the right shin and up to the lateral thigh is in the healing stage. No drainage or erythema noted.  Neuro: Alert and oriented only to person consistently. Transiently oriented to time and place.cranial nerves II-XII grossly intact  Lab Results: Basic Metabolic Panel:  Lab [id number] 0750 09/30/11 0833  NA 137 139  K 3.8 3.5  CL 95* 95*  CO2 21 25  GLUCOSE 83 94  BUN 46* 38*  CREATININE 9.14* 7.23*  CALCIUM 8.8 9.3  MG -- --  PHOS -- --   Liver Function Tests:  Lab 09/30/11 0833  AST 17  ALT 11  ALKPHOS 128*    BILITOT 0.4  PROT 8.1  ALBUMIN 3.8    Lab 09/30/11 1240  AMMONIA 27   CBC:  Lab 10/01/11 0750 09/30/11 0833  WBC 5.7 5.4  NEUTROABS -- 2.0  HGB 10.8* 10.1*  HCT 32.3* 30.7*  MCV 81.8 82.7  PLT 271 268   CBG:  Lab 10/01/11 0756 09/30/11 1649  GLUCAP 85 81   Thyroid Function Tests:  Lab 09/30/11 1408  TSH 1.902  T4TOTAL --  FREET4 --  T3FREE --  THYROIDAB --   Studies/Results: Dg Chest 2 View  09/30/2011  *RADIOLOGY REPORT*  Clinical Data: Altered mental status.  CHEST - 2 VIEW  Comparison: 04/03/2011  Findings: Heart is upper limits normal in size.  Linear scarring in the lung bases.  No acute opacities or effusions.  No acute bony abnormality.  IMPRESSION: Bibasilar scarring.  No acute findings.  Original Report Authenticated By: Raelyn Number, M.D.   Ct Head Wo Contrast  09/30/2011  *RADIOLOGY REPORT*  Clinical Data: Altered mental status.  Confusion.  Lethargy. Recent falls.  CT HEAD WITHOUT CONTRAST  Technique:  Contiguous axial images were obtained from the base of the skull through the vertex without contrast.  Comparison: 04/03/2011  Findings: The brain shows mild generalized atrophy.  There are areas of low density and hemispheric deep white matter consistent with chronic small vessel disease.  No  evidence of acute infarction, mass lesion, hemorrhage, hydrocephalus or extra-axial collection.  No skull fracture.  Sinuses, middle ears and mastoids are clear.  IMPRESSION: No acute finding.  Atrophy and chronic small vessel change of the hemispheric white matter.  Original Report Authenticated By: Jules Schick, M.D.   Medications:  Scheduled Meds:    . sodium chloride   Intravenous STAT  . darbepoetin (ARANESP) injection - DIALYSIS  25 mcg Intravenous Q Tue-HD  . gabapentin  100 mg Oral TID  . heparin  5,000 Units Subcutaneous Q8H  . metoprolol succinate  12.5 mg Oral Daily  . pantoprazole  40 mg Oral Q1200  . polyethylene glycol  17 g Oral Daily  . sodium  chloride  3 mL Intravenous Q12H  . DISCONTD: metoprolol  2.5-5 mg Intravenous Q8H  . DISCONTD: pantoprazole (PROTONIX) IV  40 mg Intravenous QHS   Continuous Infusions:  PRN Meds:.sodium chloride, acetaminophen, acetaminophen, alteplase, calcium carbonate (dosed in mg elemental calcium), camphor-menthol, docusate sodium, feeding supplement (NEPRO CARB STEADY), heparin, heparin, hydrOXYzine, lidocaine, lidocaine-prilocaine, LORazepam, ondansetron (ZOFRAN) IV, ondansetron, pentafluoroprop-tetrafluoroeth, sodium chloride, sorbitol, zolpidem, DISCONTD: gabapentin, DISCONTD: gabapentin, DISCONTD: gabapentin  Assessment/Plan: Principal Problem:  *Altered mental status Active Problems:  End stage renal disease on dialysis  Hypertension  Anemia of chronic kidney failure  Sinus tachycardia  Shingles rash  [name] is a 61 year old African American woman with a PMH significant for ESRD, hypertension, anemia of chronic renal disease, sinus tachycardia and secondary hyperparathyroidism who presents to the [facility] ED with a 1 day history of altered mental status.   1. Altered mental status: AMS little improved. She is little irritable but less confuse than yesterday. She was being able to follow simple commands. No fevers, chills, n/v, urinary symptoms, fall/trauma, lack of movement of any limbs, neck stiffness or seizure activity noted. Her admision chest x-ray /CT-head or lab negative. Suspected new medication Valtrex 1000 mg TID and Vicodin 5/500 mg PRN precipitated the altered mental status. Since she is an ESRD patient and she took 3.5 g from Saturday to Sunday morning and in addition to her previously prescribed Gabapentin 300mg  TID which likely the cause.   - Holding Valtrex, opiates - close observation for safety  - Neuro checks every 4 hrs - Diet- Renal and Carb modified - Monitor AMS for clearance   2. ESRD: Nephrologist evaluated her yesterday. No recommendation for urgent HD. Pt is schedule  for HD today. Her usual schedule is TTS.   3. Shingles Rash: The rash is in the healing stage and will observe and treat her pain symptomatically.  -Airborn/ contact isolation for active Shingles (one vesicles) -Continue acetaminophen PRN -Gabapentin 100mg  TID  4.Peripheral Neuropathy: Gabapentin 100mg  TID  5. Sinus tachycardia: Patient has been followed by cardiology in the past for her sinus tachycardia  - Continue oral Metoprolol daily  6. Anemia of chronic renal disease: Managed by nephrology. She is currently not at transfusion level but would likely benefit from her Darbepoetin (Aranesp) injection and possible iron therapy.   7. VTE prophylaxis: Heparin      LOS: 1 day   This is a Careers information officer Note.  The care of the patient was discussed with Dr. Eulas Post and the assessment and plan formulated with their assistance.  Please see their attached note for official documentation of the daily encounter.  Lucah Petta, [place] 10/01/2011, 11:17 AM

## 2011-10-02 LAB — BASIC METABOLIC PANEL
Calcium: 8.7 mg/dL (ref 8.4–10.5)
GFR calc Af Amer: 11 mL/min — ABNORMAL LOW (ref >90)
GFR calc non Af Amer: 10 mL/min — ABNORMAL LOW (ref >90)
Sodium: 140 mEq/L (ref 135–145)

## 2011-10-02 LAB — GLUCOSE, CAPILLARY: Glucose-Capillary: 81 mg/dL (ref 70–99)

## 2011-10-02 MED ORDER — HYDROCODONE-ACETAMINOPHEN 5-325 MG PO TABS
1.0000 | ORAL_TABLET | Freq: Four times a day (QID) | ORAL | Status: DC | PRN
  Administered 2011-10-02 – 2011-10-03 (×4): 1 via ORAL
  Filled 2011-10-02 (×4): qty 1

## 2011-10-02 MED ORDER — HEPARIN SODIUM (PORCINE) 1000 UNIT/ML DIALYSIS
1500.0000 [IU] | INTRAMUSCULAR | Status: DC | PRN
  Filled 2011-10-02: qty 2

## 2011-10-02 NOTE — Progress Notes (Signed)
Subjective:  More awake, alert but remains sedate; answers simple questions appropriately (oriented person/place/and only slightly off time)  Vital signs in last 24 hours: Filed Vitals:   10/01/11 1700 10/01/11 2208 10/02/11 0552 10/02/11 1024  BP: 128/83 119/81 155/94 129/85  Pulse: 98 99 101 91  Temp: 97.1 F (36.2 C) 98.3 F (36.8 C) 98.6 F (37 C)   TempSrc: Oral Oral Oral Oral  Resp: 17 17 17 18   Height:      Weight:  53.5 kg (117 lb 15.1 oz)    SpO2: 94% 95% 92% 93%   Weight change: 0.5 kg (1 lb 1.6 oz)  Intake/Output Summary (Last 24 hours) at 10/02/11 1208 Last data filed at 10/01/11 2300  Gross per 24 hour  Intake    600 ml  Output      1 ml  Net    599 ml   Labs: Basic Metabolic Panel:  Lab [id number] 0530 10/01/11 0750 09/30/11 0833  NA 140 137 139  K 4.0 3.8 3.5  CL 100 95* 95*  CO2 27 21 25   GLUCOSE 83 83 94  BUN 18 46* 38*  CREATININE 4.50* 9.14* 7.23*  CALCIUM 8.7 8.8 9.3  ALB -- -- --  PHOS -- -- --   Liver Function Tests:  Lab 09/30/11 0833  AST 17  ALT 11  ALKPHOS 128*  BILITOT 0.4  PROT 8.1  ALBUMIN 3.8   No results found for this basename: LIPASE:3,AMYLASE:3 in the last 168 hours  Lab 09/30/11 1240  AMMONIA 27   CBC:  Lab 10/01/11 0750 09/30/11 0833  WBC 5.7 5.4  NEUTROABS -- 2.0  HGB 10.8* 10.1*  HCT 32.3* 30.7*  MCV 81.8 82.7  PLT 271 268   Cardiac Enzymes: No results found for this basename: CKTOTAL:5,CKMB:5,CKMBINDEX:5,TROPONINI:5 in the last 168 hours CBG:  Lab 10/02/11 0533 10/01/11 0756 09/30/11 1649  GLUCAP 81 85 81    Iron Studies: No results found for this basename: IRON,TIBC,TRANSFERRIN,FERRITIN in the last 72 hours Studies/Results: No results found. Medications:      . sodium chloride   Intravenous STAT  . acetaminophen  650 mg Oral Once  . darbepoetin (ARANESP) injection - DIALYSIS  25 mcg Intravenous Q Tue-HD  . gabapentin  100 mg Oral TID  . heparin  5,000 Units Subcutaneous Q8H  . metoprolol  succinate  12.5 mg Oral Daily  . pantoprazole  40 mg Oral Q1200  . polyethylene glycol  17 g Oral Daily  . sodium chloride  3 mL Intravenous Q12H    I  have reviewed scheduled and prn medications.  General: comfortable Heart: RRR  Lungs: CTAB, no adventitious B.S.  Abdomen: soft, NT/ND, +BS  Extremities: no ankle edema  Skin: dried up crusting "shingles rash" top right LE and blisters intact right ankle/foot  Dialysis Access: RUA AVG, +T/B  Neuro: oriented x 3 today  Dialysis Orders: [place] [facility]. EDW 49 kg HD Bath 2K/2.25Ca bath. 3.5 hrs. Heparin 1500u. Access RUA AVF Zemplar none Epogen 3,600 Units IV/HD Venofer none Other UF profile 2   Assessment/Plan:  1. AMS- much better, back close to baseline. 2. Shingles- if needed Valtrex in ESRD is 500mg /day, after HD on HD days 3. Peripheral Neuropathy- on gabapentin; max dose 300-600/day.  4. ESRD - TTS [place]. HD tomorrow if still here. 5. HTN/volume-  6. Anemia - Hgb 10.8; Aranesp 57mcg today then wkly; follow  7. Metabolic bone disease - s/p PTX; no Zemplar;  8. History ischemic cholitis s/o right  hemicolectomy- requiring colectomy and ileostomy in the past; no BM since Thu per daughter with previously daily bowel regimen; suspect secondary pain meds; negative abd pain/tenderness; follow closely  9. DM- CBG and SSI; glucose 83 10. Dispo- per primary. OK for discharge from renal standpoint.   Kelly Splinter  MD [facility] [phone] pgr    [phone] cell 10/02/2011, 12:08 PM

## 2011-10-02 NOTE — Progress Notes (Signed)
Internal Medicine Attending  Date: 10/02/2011  Patient name: [name] Medical record number: [id number] Date of birth: 08-05-1950 Age: 61 y.o. Gender: female  I saw and evaluated the patient. I reviewed the interim history with Drs. Posey Pronto and Eulas Post and the assessment and plan were formulated together.  [name] mental status is markedly improved this AM.  She is able to carry a meaningful conversation and understand the implications of certain medical decisions being made.  She still notes pain associated with the zoster.  We will continue the gabapentin 100 mg PO TID and start back the Vicodin 1 tablet PO Q6H PRN pain.  If she does well on this regimen she will be discharged home tomorrow after HD.

## 2011-10-02 NOTE — Progress Notes (Signed)
Resident Co-sign Daily Note: I have seen the patient and reviewed the daily progress note by Patton Salles and discussed the care of the patient with them.  See below for documentation of my findings, assessment, and plans.  Subjective: Mental status much improved, still not at baseline per daughter. C/o burning pain in LLE Objective: Vital signs in last 24 hours: Filed Vitals:   10/01/11 1700 10/01/11 2208 10/02/11 0552 10/02/11 1024  BP: 128/83 119/81 155/94 129/85  Pulse: 98 99 101 91  Temp: 97.1 F (36.2 C) 98.3 F (36.8 C) 98.6 F (37 C)   TempSrc: Oral Oral Oral Oral  Resp: 17 17 17 18   Height:      Weight:  117 lb 15.1 oz (53.5 kg)    SpO2: 94% 95% 92% 93%   Physical Exam: Vitals reviewed. General: Sitting up in bed, NAD HEENT: PERRL, EOMI, no scleral icterus Cardiac: RRR, 3/6 systolic murmur w/ radiation to carotids, most prominent on right Pulm: Clear to auscultation bilaterally, no wheezes, rales, or rhonchi Abd: Soft, nontender, nondistended Ext: Warm and well perfused, no pedal edema, LLE with healing shingles rash Neuro: Alert and oriented to person, city, and Software engineer of the [place]. Able to follow simple directions.   Lab Results: Reviewed and documented in Electronic Record Micro Results: Reviewed and documented in Electronic Record Studies/Results: Reviewed and documented in Electronic Record Medications: I have reviewed the patient's current medications. Scheduled Meds:    . acetaminophen  650 mg Oral Once  . darbepoetin (ARANESP) injection - DIALYSIS  25 mcg Intravenous Q Tue-HD  . gabapentin  100 mg Oral TID  . heparin  5,000 Units Subcutaneous Q8H  . metoprolol succinate  12.5 mg Oral Daily  . pantoprazole  40 mg Oral Q1200  . polyethylene glycol  17 g Oral Daily  . sodium chloride  3 mL Intravenous Q12H   Continuous Infusions:  PRN Meds:.sodium chloride, acetaminophen, acetaminophen, calcium carbonate (dosed in mg elemental calcium),  camphor-menthol, docusate sodium, feeding supplement (NEPRO CARB STEADY), heparin, heparin, heparin, HYDROcodone-acetaminophen, hydrOXYzine, lidocaine, lidocaine-prilocaine, LORazepam, ondansetron (ZOFRAN) IV, ondansetron, pentafluoroprop-tetrafluoroeth, sodium chloride, sorbitol, zolpidem  Assessment/Plan: 61yo F with ESRD on HD and shingles, presented with AMS after taking high dose of Valtrex prescribed for her shingles outbreak, now with improving mental status. 1. Altered mental status: AMS improving. She is little less confused than yesterday, alert and oriented to person, city, and President of the [place], and is able to follow simple directions. No fevers, chills, n/v, urinary symptoms, fall/trauma,focal deficits, neck stiffness or seizure activity noted. Her admision chest x-ray /CT-head without acute findings, and labs stable. AMS likely from the Valtrex 1000 mg TID and Gabapentin 300mg  TID, given her ESRD. Much clearer today, HD yesterday likely helped improve mental status. - Stopped Valtrex  - Gabapentin changed to 100mg  tid  - Will continue to monitor  - Diet- Renal and Carb modified  2. ESRD: Nephrology consulted, HD yesterday. HD tomorrow. Her usual schedule is TTS. Cr 4.5<9.14<7.23 3. Shingles: The rash is healing well. Still with burning pain. Will continue to observe and treat her pain symptomatically.  -Airborn/ contact isolation for active Shingles no new vesicles.  -Continue acetaminophen PRN  -Gabapentin 100mg  TID  - Start Vicodin 5/325 q6h PRN pain 5. Sinus tachycardia: Patient has been followed by Cardiology in the past for her tachycardia and managed with Metoprolol. Tachycardia has been mild.  - Continue home medication of oral Metoprolol-XL daily  6. Anemia of chronic renal disease: Managed by nephrology.  She is currently not at transfusion level but would likely benefit from her Darbepoetin (Aranesp) injection per Renal recs and possible iron therapy. H&H 10.8/32.3.  7. VTE  prophylaxis: [place] Heparin  8. Dispo: Likely d/c home tomorrow pending mental status    LOS: 2 days   Otho Bellows 10/02/2011, 3:02 PM

## 2011-10-02 NOTE — Progress Notes (Signed)
Medical Student Daily Progress Note  Subjective: Pt is alert and oriented. She was having breakfast when I checked her in the morning. She looks much better than yesterday. She did conversation with me and c/o legs pain (Rt>Lt) where the shingles appeared. No fever, N/V, urinary symptoms, diarrhea, fall/trauma, lack of movement of any limbs or seizure activity since admission. Pt had scheduled HD yesterday.  Objective: Vital signs in last 24 hours: Filed Vitals:   10/01/11 1700 10/01/11 2208 10/02/11 0552 10/02/11 1024  BP: 128/83 119/81 155/94 129/85  Pulse: 98 99 101 91  Temp: 97.1 F (36.2 C) 98.3 F (36.8 C) 98.6 F (37 C)   TempSrc: Oral Oral Oral Oral  Resp: 17 17 17 18   Height:      Weight:  53.5 kg (117 lb 15.1 oz)    SpO2: 94% 95% 92% 93%   Weight change: 0.5 kg (1 lb 1.6 oz)  Intake/Output Summary (Last 24 hours) at 10/02/11 1115 Last data filed at 10/01/11 2300  Gross per 24 hour  Intake    600 ml  Output      1 ml  Net    599 ml   Physical Exam:  General: Alert and oriented X3, HEENT: PERRL, EOMI, no scleral icterus Cardiac:Tachycardic, S1 normal, S2 normal, 3/6 systolic murmur with radiation to the carotids. no RG, pulses symmetric and intact bilaterally Pulm: clear to auscultation bilaterally, moving normal volumes of air Abd: soft, nontender, nondistended, BS present Ext: warm and well perfused, no pedal edema Skin: Rash along the right shin and up to the lateral thigh is in the healing stage. No drainage or erythema noted.  Neuro: cranial nerves II-XII grossly intact  Lab Results: Basic Metabolic Panel:  Lab [id number] 0530 10/01/11 0750  NA 140 137  K 4.0 3.8  CL 100 95*  CO2 27 21  GLUCOSE 83 83  BUN 18 46*  CREATININE 4.50* 9.14*  CALCIUM 8.7 8.8  MG -- --  PHOS -- --   Liver Function Tests:  Lab 09/30/11 0833  AST 17  ALT 11  ALKPHOS 128*  BILITOT 0.4  PROT 8.1  ALBUMIN 3.8    Lab 09/30/11 1240  AMMONIA 27   CBC:  Lab 10/01/11  0750 09/30/11 0833  WBC 5.7 5.4  NEUTROABS -- 2.0  HGB 10.8* 10.1*  HCT 32.3* 30.7*  MCV 81.8 82.7  PLT 271 268   CBG:  Lab 10/02/11 0533 10/01/11 0756 09/30/11 1649  GLUCAP 81 85 81   Thyroid Function Tests:  Lab 09/30/11 1408  TSH 1.902  T4TOTAL --  FREET4 --  T3FREE --  THYROIDAB --   Medications:  Scheduled Meds:   . sodium chloride   Intravenous STAT  . acetaminophen  650 mg Oral Once  . darbepoetin (ARANESP) injection - DIALYSIS  25 mcg Intravenous Q Tue-HD  . gabapentin  100 mg Oral TID  . heparin  5,000 Units Subcutaneous Q8H  . metoprolol succinate  12.5 mg Oral Daily  . pantoprazole  40 mg Oral Q1200  . polyethylene glycol  17 g Oral Daily  . sodium chloride  3 mL Intravenous Q12H   Continuous Infusions:  PRN Meds:.sodium chloride, acetaminophen, acetaminophen, calcium carbonate (dosed in mg elemental calcium), camphor-menthol, docusate sodium, feeding supplement (NEPRO CARB STEADY), heparin, heparin, hydrOXYzine, lidocaine, lidocaine-prilocaine, LORazepam, ondansetron (ZOFRAN) IV, ondansetron, pentafluoroprop-tetrafluoroeth, sodium chloride, sorbitol, zolpidem Assessment/Plan: Principal Problem:  *Valtrex overdose Active Problems:  End stage renal disease on dialysis  Hypertension  Anemia of chronic  kidney failure  Altered mental status  Sinus tachycardia  Shingles rash  Ms. Valis is a 61 year old African American woman with a PMH significant for ESRD, hypertension, anemia of chronic renal disease, sinus tachycardia and secondary hyperparathyroidism who presents to the [facility] ED with a 1 day history of altered mental status.   1. Altered mental status: AMS improved. Pt is alert and oriented. No fevers, chills, n/v, urinary symptoms, fall/trauma, lack of movement of any limbs, neck stiffness or seizure activity noted since admission. Her admision chest x-ray , CT-head or lab negative for any acute changes. Suspected new medication Valtrex 1000 mg TID and  Vicodin 5/500 mg PRN precipitated the altered mental status. Since she has h/o  ESRD on HD and she took 3.5 g from Saturday to Sunday morning and in addition to her previously prescribed Gabapentin 300mg  TID which likely the cause.  - Holding Valtrex - close observation for safety  - Neuro checks every 4 hrs  - Diet- Renal and Carb modified  - Monitor AMS for clearance  - Anticipated DC by tomorrow  2. ESRD: Evaluated by Nephrologist . Pt had scheduled for HD yesterday.Her usual schedule is TTS.   3. Shingles Rash: The rash is in the healing stage. Pt has c/o legs pain (Rt>Lt). Plan to start opiates since she is in hospital and we can follow up if any interactions happen and aviod using NSAID's at thistime because she has h/o ESRD on HD -Airborn/ contact isolation for active Shingles (one vesicles)  -Continue acetaminophen PRN  -Gabapentin 100mg  TID -Vicodin PRN  4.Peripheral Neuropathy: Gabapentin 100mg  TID   5. Sinus tachycardia: Patient has been followed by cardiology in the past for her sinus tachycardia  - Continue oral Metoprolol daily   6. Anemia of chronic renal disease: Managed by nephrology. She is currently not at transfusion level but would likely benefit from her Darbepoetin (Aranesp) injection and possible iron therapy.   7. VTE prophylaxis: Heparin      LOS: 2 days   This is a Careers information officer Note.  The care of the patient was discussed with Dr. Eulas Post and the assessment and plan formulated with their assistance.  Please see their attached note for official documentation of the daily encounter.  Avonelle Viveros, [place] 10/02/2011, 11:15 AM

## 2011-10-03 ENCOUNTER — Inpatient Hospital Stay (HOSPITAL_COMMUNITY): Payer: Medicare Other

## 2011-10-03 NOTE — Progress Notes (Signed)
Subjective: Mental status dramatically improved since admission, now at baseline. C/o burring LLE pain, somewhat relieved by meds. Objective: Vital signs in last 24 hours: Filed Vitals:   10/03/11 1000 10/03/11 1015 10/03/11 1030 10/03/11 1045  BP: 125/85 144/89 126/85 128/81  Pulse: 114 118 104 100  Temp:      TempSrc:      Resp: 15 19 23 23   Height:      Weight:      SpO2:       Weight change: 8 lb 6 oz (3.8 kg)  Intake/Output Summary (Last 24 hours) at 10/03/11 1054 Last data filed at 10/03/11 0700  Gross per 24 hour  Intake    240 ml  Output      2 ml  Net    238 ml   Vitals reviewed. General: Sitting up in bed, NAD  HEENT: PERRL, EOMI, no scleral icterus  Cardiac: RRR, 3/6 systolic murmur w/ radiation to carotids, most prominent on right  Pulm: Clear to auscultation bilaterally, no wheezes, rales, or rhonchi  Abd: Soft, nontender, nondistended  Ext: Warm and well perfused, no pedal edema, LLE with healing shingles rash- no new vesicles  Neuro: Alert and oriented to person, city, location, and Software engineer of the [place]. Able to follow directions.     Lab Results: Basic Metabolic Panel:  Lab [id number] 0530 10/01/11 0750  NA 140 137  K 4.0 3.8  CL 100 95*  CO2 27 21  GLUCOSE 83 83  BUN 18 46*  CREATININE 4.50* 9.14*  CALCIUM 8.7 8.8  MG -- --  PHOS -- --   Liver Function Tests:  Lab 09/30/11 0833  AST 17  ALT 11  ALKPHOS 128*  BILITOT 0.4  PROT 8.1  ALBUMIN 3.8    Lab 09/30/11 1240  AMMONIA 27   CBC:  Lab 10/01/11 0750 09/30/11 0833  WBC 5.7 5.4  NEUTROABS -- 2.0  HGB 10.8* 10.1*  HCT 32.3* 30.7*  MCV 81.8 82.7  PLT 271 268  CBG:  Lab 10/02/11 0533 10/01/11 0756 09/30/11 1649  GLUCAP 81 85 81   Thyroid Function Tests:  Lab 09/30/11 1408  TSH 1.902  T4TOTAL --  FREET4 --  T3FREE --  THYROIDAB --    Medications: I have reviewed the patient's current medications. Scheduled Meds:    . darbepoetin (ARANESP) injection - DIALYSIS  25 mcg  Intravenous Q Tue-HD  . gabapentin  100 mg Oral TID  . heparin  5,000 Units Subcutaneous Q8H  . metoprolol succinate  12.5 mg Oral Daily  . pantoprazole  40 mg Oral Q1200  . polyethylene glycol  17 g Oral Daily  . sodium chloride  3 mL Intravenous Q12H   Continuous Infusions:  PRN Meds:.sodium chloride, acetaminophen, acetaminophen, calcium carbonate (dosed in mg elemental calcium), camphor-menthol, docusate sodium, feeding supplement (NEPRO CARB STEADY), heparin, heparin, heparin, HYDROcodone-acetaminophen, hydrOXYzine, lidocaine, lidocaine-prilocaine, LORazepam, ondansetron (ZOFRAN) IV, ondansetron, pentafluoroprop-tetrafluoroeth, sodium chloride, sorbitol, zolpidem  Assessment/Plan: 61yo F with ESRD on HD and shingles, presented with AMS after taking high dose of Valtrex prescribed for her shingles outbreak, now with improved mental status to baseline. 1. Altered mental status: AMS appears resolved. She is completely alert and oriented to person, city, location, and President of the [place], and is able to follow directions. No fevers, chills, n/v, urinary symptoms, fall/trauma,focal deficits, neck stiffness or seizure activity noted. WBCs normal. Her admision chest x-ray /CT-head without acute findings, and labs stable. AMS likely from the Valtrex 1000 mg TID and  Gabapentin 300mg  TID, given her ESRD. HD 7/16 likely helped improve mental status in addition to stopping Valtrex and reducing Gabapentin. - Stopped Valtrex  - Gabapentin changed to 100mg  tid  2. ESRD: Nephrology consulted, HD 7/16 and today before d/c. Her usual schedule is TTS. Cr 4.5<9.14<7.23  3. Shingles: The rash is healing well, no new vesicles. Still with burning pain. Treating pain with Vicodin and Gabapentin. -Airborn/ contact isolation for active Shingles no new vesicles.  -Gabapentin 100mg  TID  - Start Vicodin 5/325 q6h PRN pain  5. Sinus tachycardia: Patient has been followed by Cardiology in the past for her tachycardia and  managed with Metoprolol. Tachycardia has been mild.  - Continue home medication of oral Metoprolol-XL daily  6. Anemia of chronic renal disease: Managed by nephrology. H&H stable. H&H 10.8/32.3 on 7/16. 7. VTE prophylaxis: [place] Heparin  8. Dispo: D/c home today after HD.    LOS: 3 days   Otho Bellows 10/03/2011, 10:54 AM

## 2011-10-03 NOTE — Progress Notes (Signed)
Medical Student Daily Progress Note  Subjective:  Pt was having HD when I checked her in the morning. Pt is alert and oriented. She was able to continue meaningful conversation with me. Pain (shingles) on her legs improved.  No fever, N/V, urinary symptoms, diarrhea, fall/trauma, lack of movement of any limbs or seizure activity since admission.   Objective: Vital signs in last 24 hours: Filed Vitals:   10/02/11 1502 10/02/11 1856 10/02/11 2102 10/03/11 0552  BP: 131/88 133/90 147/92 149/96  Pulse: 96 94 92 88  Temp: 98.6 F (37 C) 98.6 F (37 C) 97.5 F (36.4 C) 98 F (36.7 C)  TempSrc: Oral Oral Oral Oral  Resp: 18 18 18 18   Height:      Weight:   51.8 kg (114 lb 3.2 oz)   SpO2: 94% 93% 94% 95%   Weight change: 3.8 kg (8 lb 6 oz)  Intake/Output Summary (Last 24 hours) at 10/03/11 0658 Last data filed at 10/02/11 2300  Gross per 24 hour  Intake    120 ml  Output      2 ml  Net    118 ml   Physical Exam:  General: resting in bed. HD running through RUA AVF. HEENT: PERRL, EOMI, no scleral icterus Cardiac:Tachycardic, S1 normal, S2 normal, 3/6 systolic murmur with radiation to the carotids. no RG, pulses symmetric and intact bilaterally Pulm: clear to auscultation bilaterally, moving normal volumes of air Abd: soft, nontender, nondistended, BS present Skin: Rash along the right shin and up to the lateral thigh is in the healing stage. No drainage or erythema noted.  Ext: warm and well perfused, no pedal edema Neuro: alert and oriented X3, cranial nerves II-XII grossly intact  Lab Results: Basic Metabolic Panel:  Lab [id number] 0530 10/01/11 0750  NA 140 137  K 4.0 3.8  CL 100 95*  CO2 27 21  GLUCOSE 83 83  BUN 18 46*  CREATININE 4.50* 9.14*  CALCIUM 8.7 8.8  MG -- --  PHOS -- --   Liver Function Tests:  Lab 09/30/11 0833  AST 17  ALT 11  ALKPHOS 128*  BILITOT 0.4  PROT 8.1  ALBUMIN 3.8    Lab 09/30/11 1240  AMMONIA 27   CBC:  Lab 10/01/11 0750  09/30/11 0833  WBC 5.7 5.4  NEUTROABS -- 2.0  HGB 10.8* 10.1*  HCT 32.3* 30.7*  MCV 81.8 82.7  PLT 271 268   CBG:  Lab 10/02/11 0533 10/01/11 0756 09/30/11 1649  GLUCAP 81 85 81   Thyroid Function Tests:  Lab 09/30/11 1408  TSH 1.902  T4TOTAL --  FREET4 --  T3FREE --  THYROIDAB --   Medications:  Scheduled Meds:   . darbepoetin (ARANESP) injection - DIALYSIS  25 mcg Intravenous Q Tue-HD  . gabapentin  100 mg Oral TID  . heparin  5,000 Units Subcutaneous Q8H  . metoprolol succinate  12.5 mg Oral Daily  . pantoprazole  40 mg Oral Q1200  . polyethylene glycol  17 g Oral Daily  . sodium chloride  3 mL Intravenous Q12H   Continuous Infusions:  PRN Meds:.sodium chloride, acetaminophen, acetaminophen, calcium carbonate (dosed in mg elemental calcium), camphor-menthol, docusate sodium, feeding supplement (NEPRO CARB STEADY), heparin, heparin, heparin, HYDROcodone-acetaminophen, hydrOXYzine, lidocaine, lidocaine-prilocaine, LORazepam, ondansetron (ZOFRAN) IV, ondansetron, pentafluoroprop-tetrafluoroeth, sodium chloride, sorbitol, zolpidem  Assessment/Plan: Principal Problem:  *Valtrex overdose Active Problems:  End stage renal disease on dialysis  Hypertension  Anemia of chronic kidney failure  Altered mental status  Sinus tachycardia  Shingles rash  Ms. Patrone is a 61 year old African American woman with a PMH significant for ESRD, hypertension, anemia of chronic renal disease, sinus tachycardia and secondary hyperparathyroidism who presents to the [facility] ED with altered mental status.   1. Altered mental status: AMS improved.  Alert and oriented X3. Pt is alert and oriented. No fevers, chills, n/v, urinary symptoms, fall/trauma, lack of movement of any limbs, neck stiffness or seizure activity noted since admission. Her admision chest x-ray , CT-head or lab negative for any acute changes. Suspected new medication Valtrex 1000 mg TID and Vicodin 5/500 mg PRN precipitated the  altered mental status. Since she has h/o ESRD on HD and she took 3.5 g from Saturday to Sunday morning and in addition to her previously prescribed Gabapentin 300mg  TID which likely the cause.   - D/C Valtrex  - Close observation for safety  - Neuro checks every 4 hrs  - Diet- Renal and Carb modified  - Monitor AMS for clearance  - Anticipated DC by today after HD  2. ESRD: Evaluated by Nephrologist . HD running through RUA AVF. Her usual schedule is TTS.   3. Shingles Rash: The rash is in the healing stage. Pt has c/o legs pain (Rt>Lt). Vicodin started again and pt tolerated well without any mental status changes. -Airborn/ contact isolation for active Shingles -Continue acetaminophen PRN  -Gabapentin 100 mg TID  -Vicodin 5/325 mg q6h PRN   4.Peripheral Neuropathy: Gabapentin 100mg  TID   5. Sinus tachycardia: Patient has been followed by cardiology in the past for her sinus tachycardia  - Continue oral Metoprolol daily   6. Anemia of chronic renal disease: Managed by nephrology. She is currently not at transfusion level but would likely benefit from her Darbepoetin (Aranesp) injection and possible iron therapy.   7. VTE prophylaxis: Heparin     LOS: 3 days   This is a Careers information officer Note.  The care of the patient was discussed with Dr. Eulas Post and the assessment and plan formulated with their assistance.  Please see their attached note for official documentation of the daily encounter.  Minh Jasper, [place] 10/03/2011, 6:58 AM

## 2011-10-03 NOTE — Progress Notes (Signed)
AVS reviewed with pt. Teach back method used. Pt able to verbalize understanding of AVS and questions were answered. IV DC'd. Pt remains stable. Surgical mask place on pt for transport out of facility. Educated pt on remaining away from immunocompromised persons until all lesions completely scabbed over. Pt verbalized understanding. Pt escorted via personal wheelchair to exit of facility. [place], Franky Macho

## 2011-10-03 NOTE — Progress Notes (Signed)
Subjective:  Feeling much better; now at her baseline mental status and ready for discharge after HD today  Vital signs in last 24 hours: Filed Vitals:   10/03/11 0945 10/03/11 1000 10/03/11 1015 10/03/11 1030  BP: 150/94 125/85 144/89 126/85  Pulse: 104 114 118 104  Temp:      TempSrc:      Resp: 18 15 19 23   Height:      Weight:      SpO2:       Weight change: 3.8 kg (8 lb 6 oz)  Intake/Output Summary (Last 24 hours) at 10/03/11 1045 Last data filed at 10/03/11 0700  Gross per 24 hour  Intake    240 ml  Output      2 ml  Net    238 ml   Labs: Basic Metabolic Panel:  Lab [id number] 0530 10/01/11 0750 09/30/11 0833  NA 140 137 139  K 4.0 3.8 3.5  CL 100 95* 95*  CO2 27 21 25   GLUCOSE 83 83 94  BUN 18 46* 38*  CREATININE 4.50* 9.14* 7.23*  CALCIUM 8.7 8.8 9.3  ALB -- -- --  PHOS -- -- --   Liver Function Tests:  Lab 09/30/11 0833  AST 17  ALT 11  ALKPHOS 128*  BILITOT 0.4  PROT 8.1  ALBUMIN 3.8   No results found for this basename: LIPASE:3,AMYLASE:3 in the last 168 hours  Lab 09/30/11 1240  AMMONIA 27   CBC:  Lab 10/01/11 0750 09/30/11 0833  WBC 5.7 5.4  NEUTROABS -- 2.0  HGB 10.8* 10.1*  HCT 32.3* 30.7*  MCV 81.8 82.7  PLT 271 268   Cardiac Enzymes: No results found for this basename: CKTOTAL:5,CKMB:5,CKMBINDEX:5,TROPONINI:5 in the last 168 hours CBG:  Lab 10/02/11 0533 10/01/11 0756 09/30/11 1649  GLUCAP 81 85 81    Iron Studies: No results found for this basename: IRON,TIBC,TRANSFERRIN,FERRITIN in the last 72 hours Studies/Results: No results found. Medications:      . darbepoetin (ARANESP) injection - DIALYSIS  25 mcg Intravenous Q Tue-HD  . gabapentin  100 mg Oral TID  . heparin  5,000 Units Subcutaneous Q8H  . metoprolol succinate  12.5 mg Oral Daily  . pantoprazole  40 mg Oral Q1200  . polyethylene glycol  17 g Oral Daily  . sodium chloride  3 mL Intravenous Q12H    I  have reviewed scheduled and prn medications.  General:  comfortable  Heart: RRR  Lungs: CTAB, no adventitious B.S.  Abdomen: soft, NT/ND, +BS  Extremities: no ankle edema  Dialysis Access: RUA AVG, +T/B  Neuro: oriented x 3 today   Dialysis Orders: [facility]. EDW 49 kg HD Bath 2K/2.25Ca bath. 3.5 hrs. Heparin 1500u. Access RUA AVF Zemplar none Epogen 3,600 Units IV/HD Venofer none Other UF profile 2   Assessment/Plan:  1. AMS- resolved; now at her baseline. 2. Shingles- if needed, Valtrex in ESRD is 500mg /day, after HD on HD days 3. Peripheral Neuropathy- on gabapentin; max dose 300-600/day.  4. ESRD - TTS [place]. Seen on HD today 5. HTN/volume- controlled with meds and UF on HD; new eDW 50kg 6. Anemia - Hgb 10.8; Aranesp 73mcg today then wkly; follow  7. Metabolic bone disease - s/p PTX; no Zemplar;  8. History ischemic cholitis s/o right hemicolectomy- requiring colectomy and ileostomy in the past; no BM since Thu per daughter with previously daily bowel regimen; suspect secondary pain meds; negative abd pain/tenderness; follow closely  9. DM- CBG and SSI; glucose 83  10. Dispo- per primary; scheduled for discharge after HD today.   Marni Griffon, FNP-C [facility] Pager [phone]  10/03/2011,10:45 AM  LOS: 3 days   Patient seen and examined and agree with assessment and plan as above.  Kelly Splinter  MD [facility] [phone] pgr    [phone] cell 10/03/2011, 11:26 AM

## 2011-10-03 NOTE — Progress Notes (Signed)
Internal Medicine Attending  Date: 10/03/2011  Patient name: [name] Medical record number: [id number] Date of birth: 07-03-1950 Age: 61 y.o. Gender: female  I saw and evaluated the patient. I reviewed the resident's note by Dr. Eulas Post and I agree with the resident's findings and plans as documented in her progress note.  [name] was feeling well today and noted that the pain was improved, although still present, with the Vicodin and the continue gabapentin. Her mental status is back to baseline since the Valtrex was stopped. I agree with the housestaff's plan to discharge her home today after she completed her dialysis session. She will followup in her primary care provider's office.

## 2011-10-04 ENCOUNTER — Other Ambulatory Visit: Payer: Self-pay | Admitting: Internal Medicine

## 2011-10-04 DIAGNOSIS — M792 Neuralgia and neuritis, unspecified: Secondary | ICD-10-CM

## 2011-10-04 DIAGNOSIS — B029 Zoster without complications: Secondary | ICD-10-CM

## 2011-10-04 MED ORDER — GABAPENTIN 100 MG PO CAPS: 100.0000 mg | ORAL_CAPSULE | Freq: Three times a day (TID) | ORAL | Status: AC

## 2011-10-04 NOTE — Progress Notes (Signed)
Resident Co-sign Daily Note: I have seen the patient and reviewed the daily progress note by Patton Salles and discussed the care of the patient with her. See below for documentation of my findings, assessment, and plans.  Please see my progress note from this day.    LOS: 3 days   Otho Bellows 10/04/2011, 11:21 AM

## 2011-10-04 NOTE — Discharge Summary (Signed)
Internal [facility] Discharge Note  Name: GHAZAL GOFF MRN: [id number] DOB: January 24, 1951 61 y.o.  Date of Admission: 09/30/2011  7:47 AM Date of Discharge: 10/04/2011 Attending Physician: Dr. Eppie Gibson  Discharge Diagnosis: Principal Problem:  *Valtrex overdose Active Problems:  End stage renal disease on dialysis  Hypertension  Anemia of chronic kidney failure  Sinus tachycardia  Shingles rash   Discharge Medications: Medication List  As of 10/04/2011  4:07 PM   STOP taking these medications         valACYclovir 1000 MG tablet         TAKE these medications         darbepoetin 200 MCG/0.4ML Soln   Commonly known as: ARANESP   Inject 200 mcg into the skin every 7 (seven) days. Administered at dialysis per nephrology      DIALYVITE 800 WITH ZINC 0.8 MG Tabs   Take 1 tablet by mouth daily.      gabapentin 300 MG capsule   Commonly known as: NEURONTIN   Take 600 mg by mouth every morning.      HYDROcodone-acetaminophen 5-500 MG per tablet   Commonly known as: VICODIN   Take 0.5-1 tablets by mouth every 6 (six) hours as needed. For pain      metoprolol succinate 12.5 mg Tb24   Commonly known as: TOPROL-XL   Take 12.5 mg by mouth daily.      multivitamin with minerals Tabs   Take 1 tablet by mouth daily.      pantoprazole 40 MG tablet   Commonly known as: PROTONIX   Take 40 mg by mouth daily at 12 noon.            Disposition and follow-up:   Ms.Ananda L Kaps was discharged from [facility] in Stable condition.  At the hospital follow up visit please address her mental status since her discharge for AMS likely secondary to Valtrex given for shingles. Also, please evaluate her pain control (for her shingles) on Vicodin and Gabapentin.  Follow-up Appointments: Follow-up Information    Follow up with Ivor Costa, MD. (Clinic will call you with an appointment for 1-2 weeks from discharge.)    Contact information:   [address] [place] [phone]         Discharge Orders    Future Appointments: Provider: Department: Dept Phone: Center:   10/14/2011 9:15 AM Ivor Costa, MD [facility] [phone] [facility]     Future Orders Please Complete By Expires   Diet general      Call MD for:  severe uncontrolled pain      Call MD for:  redness, tenderness, or signs of infection (pain, swelling, redness, odor or green/yellow discharge around incision site)      Call MD for:  difficulty breathing, headache or visual disturbances      Discharge instructions      Comments:   Please resume dialysis as an outpatient at your regular dialysis center. Please call the clinic or go to the ED for any new or worsening sympoms.      Consultations: Treatment Team:  Sol Blazing, MD  Procedures Performed:  Dg Chest 2 View  09/30/2011  *RADIOLOGY REPORT*  Clinical Data: Altered mental status.  CHEST - 2 VIEW  Comparison: 04/03/2011  Findings: Heart is upper limits normal in size.  Linear scarring in the lung bases.  No acute opacities or effusions.  No  acute bony abnormality.  IMPRESSION: Bibasilar scarring.  No acute findings.  Original Report Authenticated By: Raelyn Number, M.D.   Ct Head Wo Contrast  09/30/2011  *RADIOLOGY REPORT*  Clinical Data: Altered mental status.  Confusion.  Lethargy. Recent falls.  CT HEAD WITHOUT CONTRAST  Technique:  Contiguous axial images were obtained from the base of the skull through the vertex without contrast.  Comparison: 04/03/2011  Findings: The brain shows mild generalized atrophy.  There are areas of low density and hemispheric deep white matter consistent with chronic small vessel disease.  No evidence of acute infarction, mass lesion, hemorrhage, hydrocephalus or extra-axial collection.  No skull fracture.  Sinuses, middle ears and mastoids are clear.  IMPRESSION: No acute finding.  Atrophy and chronic small vessel change of the hemispheric  white matter.  Original Report Authenticated By: Jules Schick, M.D.    Admission HPI: History of Present Illness: Ms. Tejada is a 61 year old woman with a PMH significant for ESRD, hypertension, anemia of chronic renal disease, and sinus tachycardia who presents to the [facility] ED with a 1 day history of altered mental status. She initially noticed a rash on Wednesday or Thursday per her daughters report. The rash was also accompanied with a marked burning pain in the area of the rash. She received her dialysis on Saturday as per her normal schedule and then presented to the [facility] emergency department and was diagnosed with shingles. She was given a prescription for Valtrex 1g three times daily by mouth and Vicodin to take as needed for the pain. She took the 3 doses Saturday per her daughters report and then when she woke up Sunday morning she was very confused and had several hallucinations. Her daughter states that she was agitated, asking where she was and "talking nonsense." Her daughter also reports that she was seeing faces coming out of the wall and that she was speaking to her mother who passed away in the 62s. Her daughter denies any fevers, chills, nausea, vomiting, blood in her stool, urinary symptoms, or other ingestions including alcohol or other drugs. They also have not noted any limp, facial droop, or lack of movement of any limbs. Ms. Prose denies pain, numbness, or tingling to me today. Throughout the interview she perseverated on her previous answers and was also holding postures, keeping her mouth open after I checked her oropharynx and holding her arm up after I listened to her posterior lung fields. Ms. Isakson daughter states that at her baseline she is able to walk with a walker and has clear mentation and is functional with all her ADLs.  Physical Exam:  Blood pressure 151/90, pulse 107, temperature 98.1 F (36.7 C), temperature source Oral, resp. rate 15,  height 5\' 5"  (1.651 m), weight 104 lb 11.5 oz (47.5 kg), SpO2 95.00%.  Constitutional: Vital signs reviewed. Patient is a well-developed and well-nourished woman in no acute distress and cooperative with exam. Alert and oriented only to person consistently. Transiently oriented to place.  Head: Normocephalic and atraumatic  Ear: TM normal bilaterally  Mouth: no erythema or exudates, MMM  Eyes: PERRL, EOMI, conjunctivae normal, No scleral icterus.  Neck: Supple, Trachea midline normal ROM, No JVD, mass, thyromegaly, or carotid bruit present.  Cardiovascular: tachycardic, S1 normal, S2 normal, 3/6 systolic murmur with radiation to the carotids. no RG, pulses symmetric and intact bilaterally  Pulmonary/Chest: CTAB, no wheezes, rales, or rhonchi  Abdominal: Soft. Non-tender, non-distended, bowel sounds are normal, no masses, organomegaly,  or guarding present.  GU: no CVA tenderness Musculoskeletal: No joint deformities, erythema, or stiffness, ROM full and no nontender Hematology: no cervical, inginal, or axillary adenopathy.  Neurological: A&O to self only. Strength is normal and symmetric bilaterally, cranial nerve II-XII are grossly intact, no focal motor deficit, sensory intact to light touch bilaterally.  Skin: healing eschar's noted along the right shin and up to the lateral thigh. No drainage or surrounding erythema. Warm, dry and intact. No cyanosis, or clubbing.  Psychiatric: bizarre mood and labile affect. speech is stuttering and perseverates. Behavior is abnormal. Judgment and insight are impaired. Thought content is abnormal with hallucinations. Cognition is impaired and memory is impaired.  Lab results:  Basic Metabolic Panel:   [facility]  09/30/11 0833   NA  139   K  3.5   CL  95*   CO2  25   GLUCOSE  94   BUN  38*   CREATININE  7.23*   CALCIUM  9.3   MG  --   PHOS  --    Liver Function Tests:   [facility]  09/30/11 0833   AST  17   ALT  11   ALKPHOS  128*   BILITOT  0.4    PROT  8.1   ALBUMIN  3.8     Basename  09/30/11 1240   AMMONIA  27    CBC:   Basename  09/30/11 0833   WBC  5.4   NEUTROABS  2.0   HGB  10.1*   HCT  30.7*   MCV  82.7   PLT  268        Hospital Course by problem list: Principal Problem:  *Valtrex overdose Active Problems:  End stage renal disease on dialysis  Hypertension  Anemia of chronic kidney failure  Sinus tachycardia  Shingles rash  Assessment and Plan: 61yo F with ESRD on HD and shingles, presented with AMS after taking high doses of Valtrex prescribed for her shingles outbreak, now with mental status improved to baseline.   1. Altered mental status: Pt presented w/ AMS after taking 4 doses of Valtrex 1gp rescribed for her shingles outbreak. She was having audio and visual hallucinations, increased confusion, lethargy, and weakness. The family denied fevers, chills, n/v, urinary symptoms, recent trauma, focal deficits, neck stiffness, or seizure activity. Her admision chest x-ray/CT-head without acute findings, and her labs were stable. AMS likely from the Valtrex 1000 mg TID and Gabapentin 300mg  TID (taking for peripheral neuropathy- foot drop), given her ESRD. Valtrex was stopped and Gabapentin was decreased to the maximum renal dose of 100mg  TID. She had HD on 7/16, which likely helped improve her mental status along with stopping Valtrex and reducing Gabapentin. On the day of discharge her AMS had resolved, and she was completely alert and oriented to person, city, location, and President of the [place], and was able to follow directions.   2. ESRD: She is on HD as an outpatient on T, Th, S. On admission Cr 7.23 w/ GFR of 6. Nephrology was consulted while she was hospitalized and HD was continued as an inpatient. Cr 4.5<9.14<7.23   GFR 11<5<6.  3. Shingles: She began with a LLE rash about 1 week prior to her hospital admission which became progressively worse.  She presented to the [facility] ED 7/13 c/o a burning pain  and rash with healing blisters.  She was given Valtrex 1g TID and Vicodin in addition to the Gabapentin she was taking for her foot  drop. On 7/15, when she was brought to the [facility] ED for AMS, her shingles rash was healing well with only one small vesicles on the dorsomedial foot. She continued with burning pain throughout her hospitalization, and was treated with Vicodin and Gabapentin which were continued at discharge. -Gabapentin 100mg  TID  - Start Vicodin 5/325 q6h PRN pain   5. Sinus tachycardia: Patient has been followed by Cardiology in the past for her tachycardia and managed with Metoprolol. Tachycardia has been mild during her hospitalization. Continuing home medication of oral Toprol-XL daily.  6. Anemia of chronic renal disease: Managed by nephrology. Given Aranesp at dialysis q7 days by Nephrology. H&H stable during hospitalization, H&H 10.8/32.3 on 7/16.   Discharge Vitals:  BP 108/76  Pulse 128  Temp 98.4 F (36.9 C) (Oral)  Resp 21  Ht 5\' 5"  (1.651 m)  Wt 109 lb 9.1 oz (49.7 kg)  BMI 18.23 kg/m2  SpO2 98%  Discharge Labs: No results found for this or any previous visit (from the past 24 hour(s)).  Signed: Otho Bellows 10/04/2011, 4:07 PM   Time Spent on Discharge: 35 minutes

## 2011-10-04 NOTE — Progress Notes (Signed)
At discharge on 7/18, Mrs. [name] previous prescription for Gabapentin was renewed instead of prescribing the new dose of 100mg  TID. I called  Mrs. [name] today and spoke to her daughter who was with Mrs. [name]. She said that her mother was doing really well. When I asked about her pain, she stated that Mrs. [name] had only taken one 300mg  tab of Gabapentin. I asked her to please stop taking the 300mg  tabs, and I called in 100mg  tabs for Mrs. [name]. Her daughter acknowledged that she understood, and would pick up the newly dosed Gabapentin from the pharmacy.

## 2011-10-11 NOTE — Discharge Summary (Signed)
Please note that the date of discharge was 10/03/2011.  Also note that the discharge dose of gabapentin was 100 mg PO TID.

## 2011-10-14 ENCOUNTER — Encounter: Payer: Self-pay | Admitting: Internal Medicine

## 2011-10-14 ENCOUNTER — Ambulatory Visit (INDEPENDENT_AMBULATORY_CARE_PROVIDER_SITE_OTHER): Payer: Medicare Other | Admitting: Internal Medicine

## 2011-10-14 VITALS — BP 133/78 | HR 101 | Temp 97.1°F | Ht 65.0 in | Wt 114.0 lb

## 2011-10-14 DIAGNOSIS — M48062 Spinal stenosis, lumbar region with neurogenic claudication: Secondary | ICD-10-CM

## 2011-10-14 DIAGNOSIS — T375X1A Poisoning by antiviral drugs, accidental (unintentional), initial encounter: Secondary | ICD-10-CM

## 2011-10-14 DIAGNOSIS — M79606 Pain in leg, unspecified: Secondary | ICD-10-CM

## 2011-10-14 DIAGNOSIS — T50904A Poisoning by unspecified drugs, medicaments and biological substances, undetermined, initial encounter: Secondary | ICD-10-CM

## 2011-10-14 DIAGNOSIS — I10 Essential (primary) hypertension: Secondary | ICD-10-CM

## 2011-10-14 DIAGNOSIS — T375X4A Poisoning by antiviral drugs, undetermined, initial encounter: Secondary | ICD-10-CM

## 2011-10-14 DIAGNOSIS — M79609 Pain in unspecified limb: Secondary | ICD-10-CM

## 2011-10-14 DIAGNOSIS — T50901A Poisoning by unspecified drugs, medicaments and biological substances, accidental (unintentional), initial encounter: Secondary | ICD-10-CM

## 2011-10-14 DIAGNOSIS — B029 Zoster without complications: Secondary | ICD-10-CM

## 2011-10-14 DIAGNOSIS — M79604 Pain in right leg: Secondary | ICD-10-CM | POA: Insufficient documentation

## 2011-10-14 MED ORDER — HYDROCODONE-ACETAMINOPHEN 7.5-750 MG PO TABS: 1.0000 | ORAL_TABLET | Freq: Four times a day (QID) | ORAL | Status: DC | PRN

## 2011-10-14 NOTE — Assessment & Plan Note (Signed)
Patient has lumbar stenosis s/p lumbar disc surgery in 11/12. Patient denies back pain. Reports foot pain with bilateral foot drop post-op. No acute issues. Will continue her current regiment and follow up.

## 2011-10-14 NOTE — Assessment & Plan Note (Signed)
Patient's right leg pain is most likely due to post shingles neuropathy. Although patient tripped her step and twisted her leg, there is no bony deformity or swelling. There is mild tenderness over her shin bone. Patient is still has severe pain which is not controlled well with Vicodin 5/500.  --We'll continue Neurontin --Will give her prescription for Vicodin 7.5/500.

## 2011-10-14 NOTE — Patient Instructions (Signed)
1.Please take all medications as prescribed.  3. If you have worsening of your symptoms or new symptoms arise, please call the clinic [phone]), or go to the ER immediately if symptoms are severe.  Shingles Shingles is caused by the same virus that causes chickenpox (varicella zoster virus or VZV). Shingles often occurs many years or decades after having chickenpox. That is why it is more common in adults older than 50 years. The virus reactivates and breaks out as an infection in a nerve root. SYMPTOMS   The initial feeling (sensations) may be pain. This pain is usually described as:   Burning.   Stabbing.   Throbbing.   Tingling in the nerve root.   A red rash will follow in a couple days. The rash may occur in any area of the body and is usually on one side (unilateral) of the body in a band or belt-like pattern. The rash usually starts out as very small blisters (vesicles). They will dry up after 7 to 10 days. This is not usually a significant problem except for the pain it causes.   Long-lasting (chronic) pain is more likely in an elderly person. It can last months to years. This condition is called postherpetic neuralgia.  Shingles can be an extremely severe infection in someone with AIDS, a weakened immune system, or with forms of leukemia. It can also be severe if you are taking transplant medicines or other medicines that weaken the immune system. TREATMENT  Your caregiver will often treat you with:  Antiviral drugs.   Anti-inflammatory drugs.   Pain medicines.  Bed rest is very important in preventing the pain associated with herpes zoster (postherpetic neuralgia). Application of heat in the form of a hot water bottle or electric heating pad or gentle pressure with the hand is recommended to help with the pain or discomfort. PREVENTION  A varicella zoster vaccine is available to help protect against the virus. The Food and Drug Administration approved the varicella zoster  vaccine for individuals 24 years of age and older. HOME CARE INSTRUCTIONS   Cool compresses to the area of rash may be helpful.   Only take over-the-counter or prescription medicines for pain, discomfort, or fever as directed by your caregiver.   Avoid contact with:   Babies.   Pregnant women.   Children with eczema.   Elderly people with transplants.   People with chronic illnesses, such as leukemia and AIDS.   If the area involved is on your face, you may receive a referral for follow-up to a specialist. It is very important to keep all follow-up appointments. This will help avoid eye complications, chronic pain, or disability.  SEEK IMMEDIATE MEDICAL CARE IF:   You develop any pain (headache) in the area of the face or eye. This must be followed carefully by your caregiver or ophthalmologist. An infection in part of your eye (cornea) can be very serious. It could lead to blindness.   You do not have pain relief from prescribed medicines.   Your redness or swelling spreads.   The area involved becomes very swollen and painful.   You have a fever.   You notice any red or painful lines extending away from the affected area toward your heart (lymphangitis).   Your condition is worsening or has changed.  Document Released: 03/04/2005 Document Revised: 02/21/2011 Document Reviewed: 02/06/2009 [facility] Patient Information 2012 [place].

## 2011-10-14 NOTE — Assessment & Plan Note (Signed)
Shingles rash almost resolved. There is no active blisters. There are only a few crusted small lesions. No further anti-viral treatment needed. Will follow up.

## 2011-10-14 NOTE — Progress Notes (Signed)
Patient ID: [name], female   DOB: 28-Jul-1950, 61 y.o.   MRN: [id number]  Subjective:   Patient ID: [name] female   DOB: 1950/07/28 61 y.o.   MRN: [id number]  HPI: Ms.[name] is a 60 y.o. with a past medical history as outlined below, who presents for hospital followup visit.  Patient was recently hospitalized from 7/15 to 7/19 due to altered mental status secondary to overdose of Valtrex. Pt was presented w/ AMS in that admission after taking 4 doses of Valtrex 1gp rescribed for her shingles outbreak. Her admision chest x-ray/CT-head without acute findings, and her labs were stable. Given her ESRD, it was believed that AMS was most likely from the Valtrex 1000 mg TID and Gabapentin 300mg  TID (taking for peripheral neuropathy- foot drop). After Valtrex was stopped and Gabapentin was decreased to the maximum renal dose of 100mg  TID, her AMS had resolved at discharge. Today patient feels normal. She is orientated x3. She still feels burning and sore in her right lower leg. There is no blisters anymore. She reports that Vicodin 5/500 is not very effective for her leg pain, she wants to have a alternative medications.  Patient reports that she tripped her steps and twisted her right leg one week ago. She feels sore in her right leg over shin bone anteriorely. There is no edema or deformity over her left leg.   Denies fever, chills, fatigue, headaches,  cough, chest pain, SOB,  abdominal pain,diarrhea, constipation, dysuria, urgency, frequency, hematuria.    Past Medical History  Diagnosis Date  . Seizures     r/t HTN in 1990's x 1  . Brain aneurysm     No records could be found  . Blood transfusion 1990's    r/t Kidney removal surgery  . Arthritis     Back  . Anemia     anemia of chronic disease likely 2/2 ESRD per last anemia panel (02/2011) with Fe 35, TIBC 167, ferritin 2041  // BL Hgb 8-10  . Umbilical hernia age 65  . Heart murmur     Born with heart mumur,  does not require follow up per pt  . Hypertension     Does not see a heart doctor, had pre transplant stress test at [facility]    . End stage renal disease on dialysis     Secondary to hypertension // T/Th/Sat dialysis on [employer]  . Hepatitis C   . Colitis, ischemic 01/2011    S/P partial colectomy of right hemicolon and ileostomy placement  . Lumbar spinal stenosis      bilateral L4-5 and L5-S1 posterior lumbar interbody arthrodesis and bilateral L4-S1 posterior lateral arthrodesis for lumbar stenosis dynamic lumbar spondylolisthesis lumbar spondylosis lumbar radiculopathy with foot drop // s/p surgery 01/2011 - see surgery section for details.  Marland Kitchen Dysrhythmia     SVT for brief period in Feb 2013  . Dialysis patient     [place]  . Diabetes mellitus     Borderline  . Foot drop, bilateral     after back surgery  . Anuria     Due to dialysis  . Aortic stenosis, mild     03/2011 echo   Current Outpatient Prescriptions  Medication Sig Dispense Refill  . B Complex-C-Zn-Folic Acid (DIALYVITE Q000111Q WITH ZINC) 0.8 MG TABS Take 1 tablet by mouth daily.      . darbepoetin (ARANESP) 200 MCG/0.4ML SOLN Inject 200 mcg into the skin every 7 (seven)  days. Administered at dialysis per nephrology      . gabapentin (NEURONTIN) 100 MG capsule Take 1 capsule (100 mg total) by mouth 3 (three) times daily.  90 capsule  2  . HYDROcodone-acetaminophen (VICODIN ES) 7.5-750 MG per tablet Take 1 tablet by mouth every 6 (six) hours as needed for pain.  20 tablet  0  . HYDROcodone-acetaminophen (VICODIN) 5-500 MG per tablet Take 0.5-1 tablets by mouth every 6 (six) hours as needed. For pain      . metoprolol succinate (TOPROL-XL) 12.5 mg TB24 Take 12.5 mg by mouth daily.      . Multiple Vitamin (MULTIVITAMIN WITH MINERALS) TABS Take 1 tablet by mouth daily.      . pantoprazole (PROTONIX) 40 MG tablet Take 40 mg by mouth daily at 12 noon.       Family History  Problem Relation  Age of Onset  . Anesthesia problems Neg Hx   . Hypotension Neg Hx   . Malignant hyperthermia Neg Hx   . Pseudochol deficiency Neg Hx   . Diabetes Father   . Stroke Mother    History   Social History  . Marital Status: Legally Separated    Spouse Name: N/A    Number of Children: 2  . Years of Education: 10th grade   Occupational History  . Retired     previously worked as a Producer, television/film/video History Main Topics  . Smoking status: Former Smoker -- 0.5 packs/day for 48 years    Types: Cigarettes    Quit date: 08/17/2010  . Smokeless tobacco: Never Used  . Alcohol Use: No     1 pint on Fridays  . Drug Use: No  . Sexually Active: No   Other Topics Concern  . None   Social History Narrative   Lives in [place] with daughter. Has 2 On HD since for 20 years. Worked as a Sports coach. Went to 10th grade. Can read and write.    Review of Systems:  General: no fevers, chills, no changes in body weight, no changes in appetite Skin: There is no blisters over her legs.  HEENT: no blurry vision, hearing changes or sore throat Pulm: no dyspnea, coughing, wheezing CV: no chest pain, palpitations, shortness of breath Abd: no nausea/vomiting, abdominal pain, diarrhea/constipation GU: no dysuria, hematuria, polyuria Ext: There is mild tenderness over her right lower leg anteriorly.  Neuro: no weakness, numbness, or tingling   Objective:  Physical Exam: Filed Vitals:   10/14/11 0946  BP: 133/78  Pulse: 101  Temp: 97.1 F (36.2 C)  TempSrc: Oral  Height: 5\' 5"  (1.651 m)  Weight: 114 lb (51.71 kg)   General: resting in bed, not in acute distress HEENT: PERRL, EOMI, no scleral icterus Cardiac: S1/S2, RRR, has 2/6 systolic murmurs, gallops or rubs Pulm: Good air movement bilaterally, Clear to auscultation bilaterally, No rales, wheezing, rhonchi or rubs. Abd: Soft,  nondistended, nontender, no rebound pain, no organomegaly, BS present Ext: 2+DP/PT pulse bilaterally. There is mild  tenderness over her right shin bone. There is no any deformity of her leg Musculoskeletal: No joint deformities, erythema, or stiffness, ROM full and no nontender Skin: There is no blisters over her legs. There are a few scattered crusted small lesions in her right lower leg anteriorly.  Neuro: alert and oriented X3, cranial nerves II-XII grossly intact, muscle strength 5/5 in all extremeties,  sensation to light touch intact.  Psych.: patient is not psychotic, no suicidal or hemocidal ideation.  Assessment & Plan:

## 2011-10-14 NOTE — Assessment & Plan Note (Signed)
It is well controlled. Today blood pressure is 134/78. We'll continue current regimen.

## 2011-10-14 NOTE — Assessment & Plan Note (Signed)
After discontinued her Valtrex and decreased dosage of Neurontin, patient's altered mental status resolved. Today patient is completely orientated. She feels normal today. There is no new issues.

## 2011-10-24 ENCOUNTER — Telehealth: Payer: Self-pay | Admitting: *Deleted

## 2011-10-24 ENCOUNTER — Other Ambulatory Visit: Payer: Self-pay | Admitting: *Deleted

## 2011-10-24 ENCOUNTER — Other Ambulatory Visit: Payer: Self-pay | Admitting: Internal Medicine

## 2011-10-24 DIAGNOSIS — M79606 Pain in leg, unspecified: Secondary | ICD-10-CM

## 2011-10-24 MED ORDER — HYDROCODONE-ACETAMINOPHEN 7.5-750 MG PO TABS: 1.0000 | ORAL_TABLET | Freq: Four times a day (QID) | ORAL | Status: DC | PRN

## 2011-10-24 NOTE — Telephone Encounter (Signed)
Also daughter stated pt had fallen again (Sunday) and wanted to know if you could order an x-ray of her right leg. I told her, her mother might need to be seen first.  Her mother stated she heard something "pop". Please advise   Thanks

## 2011-10-24 NOTE — Telephone Encounter (Signed)
Message copied by Ebbie Latus on Thu Oct 24, 2011  5:05 PM ------      Message from: Ivor Costa      Created: Thu Oct 24, 2011  4:42 PM      Regarding: she needs to be evaluated first for X-ray       She needs to be evaluated first. Please make an appointment for her. Thanks.            Ivor Costa, MD      PGY2, Internal Medicine Teaching Service      Pager: [phone]

## 2011-10-24 NOTE — Telephone Encounter (Signed)
Talked to pt's daughter; instructed to call back tomorrow to scheduled an appt for her mother; she agreed.

## 2011-10-24 NOTE — Telephone Encounter (Signed)
Vicodin 7.5/750  rx called to [facility]

## 2011-11-01 ENCOUNTER — Emergency Department (HOSPITAL_COMMUNITY)
Admission: EM | Admit: 2011-11-01 | Discharge: 2011-11-01 | Disposition: A | Discharge status: Home / Self Care | Payer: Medicare Other | Attending: Emergency Medicine | Admitting: Emergency Medicine

## 2011-11-01 ENCOUNTER — Encounter (HOSPITAL_COMMUNITY): Payer: Self-pay | Admitting: *Deleted

## 2011-11-01 DIAGNOSIS — M79609 Pain in unspecified limb: Secondary | ICD-10-CM | POA: Insufficient documentation

## 2011-11-01 NOTE — ED Notes (Signed)
MD at bedside. EDPA Tiffany present to evaluate this pt

## 2011-11-01 NOTE — ED Provider Notes (Signed)
11/01/2011 6:42 PM   When I went into room to see patient, her and her daughter decided they did not want to stay for xrays as they had been waiting for so long. I did not do HPI or physical exam.  Pt left post triage without being seen by provider.  Linus Mako, [place] 11/01/11 1844

## 2011-11-01 NOTE — ED Notes (Signed)
Pt presents with no acute distress.  Reports falling several times this past week.  Pt states her R t foot "gave way"- Pt reports numbness and weakness but this is not new.  Pt has no other complaints.  Pt wears braces to legs bil s/p back surgery resulting in bil foot drop.-

## 2011-11-01 NOTE — ED Provider Notes (Signed)
Medical screening examination/treatment/procedure(s) were performed by non-physician practitioner and as supervising physician I was immediately available for consultation/collaboration.    Dot Lanes, MD 11/01/11 [phone]

## 2011-11-01 NOTE — ED Notes (Signed)
Pt after discussing with EDPA Tiffany xrays and the delay.  Pt encouraged to stay yet decided not to stay.  Pt with family did not sign AMA

## 2011-11-01 NOTE — ED Notes (Signed)
Pt reports fall last Tuesday- pain to left leg. Pain from thigh down to ankle. No obvious deformities noted.

## 2011-11-03 ENCOUNTER — Emergency Department (HOSPITAL_COMMUNITY): Payer: Medicare Other

## 2011-11-03 ENCOUNTER — Encounter (HOSPITAL_COMMUNITY): Payer: Self-pay | Admitting: *Deleted

## 2011-11-03 ENCOUNTER — Emergency Department (HOSPITAL_COMMUNITY)
Admission: EM | Admit: 2011-11-03 | Discharge: 2011-11-03 | Disposition: A | Discharge status: Home / Self Care | Payer: Medicare Other | Attending: Emergency Medicine | Admitting: Emergency Medicine

## 2011-11-03 DIAGNOSIS — M25559 Pain in unspecified hip: Secondary | ICD-10-CM | POA: Insufficient documentation

## 2011-11-03 DIAGNOSIS — Z79899 Other long term (current) drug therapy: Secondary | ICD-10-CM | POA: Insufficient documentation

## 2011-11-03 DIAGNOSIS — M545 Low back pain, unspecified: Secondary | ICD-10-CM | POA: Insufficient documentation

## 2011-11-03 DIAGNOSIS — Z992 Dependence on renal dialysis: Secondary | ICD-10-CM | POA: Insufficient documentation

## 2011-11-03 DIAGNOSIS — M79609 Pain in unspecified limb: Secondary | ICD-10-CM | POA: Insufficient documentation

## 2011-11-03 DIAGNOSIS — B0229 Other postherpetic nervous system involvement: Secondary | ICD-10-CM | POA: Insufficient documentation

## 2011-11-03 DIAGNOSIS — M79604 Pain in right leg: Secondary | ICD-10-CM

## 2011-11-03 DIAGNOSIS — Z981 Arthrodesis status: Secondary | ICD-10-CM | POA: Insufficient documentation

## 2011-11-03 DIAGNOSIS — G8929 Other chronic pain: Secondary | ICD-10-CM | POA: Insufficient documentation

## 2011-11-03 DIAGNOSIS — I120 Hypertensive chronic kidney disease with stage 5 chronic kidney disease or end stage renal disease: Secondary | ICD-10-CM | POA: Insufficient documentation

## 2011-11-03 DIAGNOSIS — F17200 Nicotine dependence, unspecified, uncomplicated: Secondary | ICD-10-CM | POA: Insufficient documentation

## 2011-11-03 DIAGNOSIS — R7309 Other abnormal glucose: Secondary | ICD-10-CM | POA: Insufficient documentation

## 2011-11-03 DIAGNOSIS — N186 End stage renal disease: Secondary | ICD-10-CM | POA: Insufficient documentation

## 2011-11-03 MED ORDER — HYDROCODONE-ACETAMINOPHEN 5-325 MG PO TABS
1.0000 | ORAL_TABLET | Freq: Once | ORAL | Status: AC
  Administered 2011-11-03: 1 via ORAL
  Filled 2011-11-03: qty 1

## 2011-11-03 MED ORDER — HYDROCODONE-ACETAMINOPHEN 5-325 MG PO TABS: ORAL_TABLET | ORAL | Status: DC

## 2011-11-03 NOTE — ED Notes (Signed)
Md left bedside.

## 2011-11-03 NOTE — ED Provider Notes (Signed)
History     CSN: [id number]  Arrival date & time 11/03/11  [id number]   First MD Initiated Contact with Patient 11/03/11 [phone]      Chief Complaint  Patient presents with  . Leg Pain     HPI Pt was seen at 0705.  Per pt and her daughter, c/o gradual onset and persistence of constant acute flair of her chronic RLE "pain" for the past 1 month.  States she has "fallen a lot" and is here requesting "an xray to make sure I didn't break something."  Endorses she "fell and twisted my leg" during one fall. States she has a long hx of chronic RLE pain and footdrop s/p previous back surgery.  States she has been wearing her usual bilat LE's metal braces.  Denies back pain, no new focal motor weakness or new tingling/numbness in extremity. Denies incont/retention of bowel or bladder, no saddle anesthesia, no abd pain.   The symptoms have been associated with no other complaints. The patient has a significant history of similar symptoms previously, recently being evaluated for this complaint.    PMD: [facility] Neurosurgery:  Dr. Sherwood Gambler Past Medical History  Diagnosis Date  . Seizures     r/t HTN in 1990's x 1  . Brain aneurysm     No records could be found  . Blood transfusion 1990's    r/t Kidney removal surgery  . Arthritis     Back  . Anemia     anemia of chronic disease likely 2/2 ESRD per last anemia panel (02/2011) with Fe 35, TIBC 167, ferritin 2041  // BL Hgb 8-10  . Umbilical hernia age 35  . Heart murmur     Born with heart mumur, does not require follow up per pt  . Hypertension     Does not see a heart doctor, had pre transplant stress test at [facility]    . End stage renal disease on dialysis     Secondary to hypertension // T/Th/Sat dialysis on [employer]  . Hepatitis C   . Colitis, ischemic 01/2011    S/P partial colectomy of right hemicolon and ileostomy placement  . Lumbar spinal stenosis      bilateral L4-5 and L5-S1 posterior lumbar interbody arthrodesis and bilateral L4-S1  posterior lateral arthrodesis for lumbar stenosis dynamic lumbar spondylolisthesis lumbar spondylosis lumbar radiculopathy with foot drop // s/p surgery 01/2011 - see surgery section for details.  Marland Kitchen Dysrhythmia     SVT for brief period in Feb 2013  . Dialysis patient     [place]  . Diabetes mellitus     Borderline  . Foot drop, bilateral     after back surgery  . Anuria     Due to dialysis  . Aortic stenosis, mild     03/2011 echo    Past Surgical History  Procedure Date  . Nephrectomy 2010    right side done at [facility] on transplant list  . Umbilical hernia repair age 67- 71  . Appendectomy 1960's  . Vascular surgery     right arm dialysis graft  . Tonsillectomy     as a child  . Rib resection     d/t kidney removal  . Back surgery 01/30/2011    L4-S1 lumbar laminectomy facetectomy and foraminotomies for decompression a bilateral L4-5 and L5-S1 posterior lumbar interbody arthrodesis and bilateral L4-S1 posterior lateral arthrodesis for lumbar stenosis dynamic lumbar spondylolisthesis lumbar spondylosis lumbar radiculopathy with foot drop  .  Laparotomy 02/09/2011    Procedure: EXPLORATORY LAPAROTOMY;  Surgeon: Harl Bowie, MD;  Location: [place];  Service: General;  Laterality: N/A;  . Partial colectomy 02/09/2011    Reason for surgery: ischemic colitis of right hemicolon; Surgeon: Harl Bowie, MD;  Location: [place];  Service: General;  Laterality: Right;  . Colostomy 02/09/2011    Reason for surgery: ischemic colitis of right hemicolon requiring partial colectomy and ileostomy.  Surgeon: Harl Bowie, MD;  Location: [place];  Service: General;  Laterality: Right;  . Colonoscopy 02/19/2011    Procedure: COLONOSCOPY;  Surgeon: Missy Sabins, MD;  Location: [place];  Service: Endoscopy;  Laterality: N/A;  . Colonoscopy 03/07/2011    Procedure: COLONOSCOPY;  Surgeon: Cleotis Nipper, MD;  Location: [facility] ENDOSCOPY;  Service:  Endoscopy;  Laterality: N/A;  no prep needed--pt has long Hartmann's pouch and ileostomy  . Total parathyroidectomy 01/17/2001    with autotransplantation into left forearm // due to secondary hyperparathyroidism from ESRD  . Av fistula placement 04/08/2011    Procedure: INSERTION OF ARTERIOVENOUS (AV) GORE-TEX GRAFT ARM;  Surgeon: Rosetta Posner, MD;  Location: [facility] OR;  Service: Vascular;  Laterality: Right;  Revision of Upper Arm Gore-Tex Graft  . Ileostomy closure 05/29/2011    Procedure: ILEOSTOMY TAKEDOWN;  Surgeon: Harl Bowie, MD;  Location: [facility] OR;  Service: General;  Laterality: N/A;    Family History  Problem Relation Age of Onset  . Anesthesia problems Neg Hx   . Hypotension Neg Hx   . Malignant hyperthermia Neg Hx   . Pseudochol deficiency Neg Hx   . Diabetes Father   . Stroke Mother     History  Substance Use Topics  . Smoking status: Current Some Day Smoker -- 0.5 packs/day for 48 years    Types: Cigarettes    Last Attempt to Quit: 08/17/2010  . Smokeless tobacco: Never Used  . Alcohol Use: No     1 pint on Fridays    Review of Systems ROS: Statement: All systems negative except as marked or noted in the HPI; Constitutional: Negative for fever and chills. ; ; Eyes: Negative for eye pain, redness and discharge. ; ; ENMT: Negative for ear pain, hoarseness, nasal congestion, sinus pressure and sore throat. ; ; Cardiovascular: Negative for chest pain, palpitations, diaphoresis, dyspnea and peripheral edema. ; ; Respiratory: Negative for cough, wheezing and stridor. ; ; Gastrointestinal: Negative for nausea, vomiting, diarrhea, abdominal pain, blood in stool, hematemesis, jaundice and rectal bleeding. . ; ; Genitourinary: Negative for dysuria, flank pain and hematuria. ; ; Musculoskeletal: +RLE pain. Negative for back pain and neck pain. Negative for swelling and trauma.; ; Skin: Negative for pruritus, rash, abrasions, blisters, bruising and skin lesion.; ; Neuro: Negative for  headache, lightheadedness and neck stiffness. Negative for weakness, altered level of consciousness , altered mental status, extremity weakness, paresthesias, involuntary movement, seizure and syncope.     Allergies  Contrast media and Iohexol  Home Medications   Current Outpatient Rx  Name Route Sig Dispense Refill  . DIALYVITE 800/ZINC 0.8 MG PO TABS Oral Take 1 tablet by mouth daily.    Marland Kitchen DARBEPOETIN ALFA-POLYSORBATE 200 MCG/0.4ML IJ SOLN Subcutaneous Inject 200 mcg into the skin every 7 (seven) days. Administered at dialysis per nephrology; taken every Thursday.    Marland Kitchen GABAPENTIN 100 MG PO CAPS Oral Take 1 capsule (100 mg total) by mouth 3 (three) times daily. 90 capsule 2  . HYDROCODONE-ACETAMINOPHEN 7.5-750 MG PO TABS  Oral Take 1 tablet by mouth every 6 (six) hours as needed for pain. 20 tablet 0  . METOPROLOL SUCCINATE ER 25 MG PO TB24 Oral Take 12.5 mg by mouth as directed. Taken on Tuesdays, Thursdays and Saturdays.    . ADULT MULTIVITAMIN W/MINERALS CH Oral Take 1 tablet by mouth daily.    Marland Kitchen PANTOPRAZOLE SODIUM 40 MG PO TBEC Oral Take 40 mg by mouth daily at 12 noon.      BP 149/100  Pulse 94  Temp 97.9 F (36.6 C) (Oral)  Resp 18  SpO2 97%  Physical Exam 0710: Physical examination:  Nursing notes reviewed; Vital signs and O2 SAT reviewed;  Constitutional: Well developed, Well nourished, Well hydrated, In no acute distress; Head:  Normocephalic, atraumatic; Eyes: EOMI, PERRL, No scleral icterus; ENMT: Mouth and pharynx normal, Mucous membranes moist; Neck: Supple, Full range of motion, No lymphadenopathy; Cardiovascular: Regular rate and rhythm, No gallop; Respiratory: Breath sounds clear & equal bilaterally, No wheezes.  Speaking full sentences with ease, Normal respiratory effort/excursion; Chest: Nontender, Movement normal;; Extremities: Pelvis stable. Pulses normal, No tenderness right hip/knee/ankle/foot. No RLE joints deformity or edema. RLE compartments soft. No calf edema  or asymmetry. Scabs to right medial lower thigh to leg to foot, no vesicles, no drainage, no erythema, no ecchymosis.; Neuro: AA&Ox3, Major CN grossly intact.  Speech clear. No gross focal motor or sensory deficits in extremities.; Skin: Color normal, Warm, Dry.   ED Course  Procedures   0715:  Pt with multiple visits to the ED and her PMD for same complaint.  Known chronic pain RLE, esp has increased since dx shingles last month.  Daughter is at bedside.  States they "just came to get some xrays today."  Known lumbar DDD on CT LS 11/16/212.  Normal aorta without aneurysm on [place] aorts 04/09/2011.  Will XR per pt request.  1020:  Requesting refill of her hydrocodone.  Dx testing d/w pt and family.  Questions answered.  Verb understanding, agreeable to d/c home with outpt f/u.   MDM  MDM Reviewed: nursing note, vitals and previous chart Interpretation: x-ray   Dg Lumbar Spine Complete 11/03/2011  *RADIOLOGY REPORT*  Clinical Data: Lower back pain.  Right hip pain.  Multiple falls. Lumbar spine surgery in January.  LUMBAR SPINE - COMPLETE 4+ VIEW  Comparison: 03/04 through03/06/2011  Findings: Patient has had prior posterior fusion L4-S1.  There is no evidence for acute fracture or dislocation.  There is dense atherosclerotic calcification of the abdominal aorta.  Surgical clips overlying the lower abdomen.  Bones appear osteopenic. Regional bowel gas pattern is nonobstructive.  IMPRESSION:  1.  Postoperative changes. 2.  No evidence for acute abnormality.  Original Report Authenticated By: Glenice Bow, M.D.   Dg Hip Complete Right 11/03/2011  *RADIOLOGY REPORT*  Clinical Data: Fall.  Lower back pain.  Pain radiates down right leg.  Status post lumbar surgery in January.  RIGHT HIP - COMPLETE 2+ VIEW  Comparison: CT of the abdomen pelvis 04/07/2011  Findings: The patient has had prior lower lumbar posterior fusion. AP and lateral views of the right hip show no evidence for acute fracture or  subluxation.  The there is dense atherosclerotic calcification of the femoral artery.  IMPRESSION:  1.  Postoperative changes. 2. No evidence for acute  abnormality.  Original Report Authenticated By: Glenice Bow, M.D.   Dg Ankle Complete Right 11/03/2011  *RADIOLOGY REPORT*  Clinical Data: Fall  RIGHT ANKLE - COMPLETE 3+ VIEW  Comparison: None.  Findings: Cast material obscures the osseous structures limiting the exam.  Osteopenia.  No obvious acute fracture or dislocation in the ankle.  Ankle mortise is anatomic.  There is subtle lucency through the base of the fifth metatarsal.  Nondisplaced fracture is not excluded.  IMPRESSION: No evidence of fracture or dislocation involving the ankle.  There is lucency through the base of the fifth metatarsal. Fracture is not excluded.  This area was obscured on the foot study because of cast material.  Repeat foot study without cast material is recommended.  Original Report Authenticated By: Jamas Lav, M.D.   Dg Knee Complete 4 Views Right 11/03/2011  *RADIOLOGY REPORT*  Clinical Data: Low back pain.  Right hip pain radiating down the right leg.  RIGHT KNEE - COMPLETE 4+ VIEW  Comparison: None.  Findings: Bones appear radiolucent.  There is no evidence for acute fracture or subluxation.  No joint effusion.  Dense atherosclerotic calcification of the popliteal artery.  There is mild medial and patellofemoral compartment narrowing.  IMPRESSION:  1.  Suspect osteopenia/osteoporosis. 2. No evidence for acute  abnormality.  Original Report Authenticated By: Glenice Bow, M.D.   Dg Foot Complete Right 11/03/2011  *RADIOLOGY REPORT*  Clinical Data: Right foot pain.  No known trauma.  RIGHT FOOT COMPLETE - 3+ VIEW  Comparison: 11/03/2011  Findings: Bones appear osteopenic/osteoporotic.  There is no evidence for acute fracture or subluxation.  Small arteries are calcified.  No radiopaque foreign body or soft tissue gas.  IMPRESSION:  1. No evidence for acute  osseous abnormality. 2.  Bones appear osteopenic/osteoporotic.  Original Report Authenticated By: Glenice Bow, M.D.   Dg Foot Complete Right 11/03/2011  *RADIOLOGY REPORT*  Clinical Data: Fall  RIGHT FOOT COMPLETE - 3+ VIEW  Comparison: None.  Findings: Cast material limits view of the osseous structures. Osteopenia.  No obvious fracture or dislocation. Prominent vascular calcifications.  IMPRESSION: Limited study.  No obvious acute bony injury.  Original Report Authenticated By: Jamas Lav, M.D.           Alfonzo Feller, DO 11/07/11 1552

## 2011-11-03 NOTE — ED Notes (Signed)
Pt c/o continued and unrelieved pain in R leg. Pt has hx of chronic leg pain after multiple falls.

## 2011-11-11 ENCOUNTER — Other Ambulatory Visit: Payer: Self-pay | Admitting: *Deleted

## 2011-11-11 DIAGNOSIS — M79604 Pain in right leg: Secondary | ICD-10-CM

## 2011-11-11 NOTE — Telephone Encounter (Signed)
Do you want to change the amt from 20 to possibly 120 at least? Just a thought since refills are every few weeks

## 2011-11-12 ENCOUNTER — Other Ambulatory Visit: Payer: Self-pay | Admitting: Internal Medicine

## 2011-11-12 DIAGNOSIS — M79606 Pain in leg, unspecified: Secondary | ICD-10-CM

## 2011-11-12 MED ORDER — HYDROCODONE-ACETAMINOPHEN 5-325 MG PO TABS: ORAL_TABLET | ORAL | Status: AC

## 2011-11-13 ENCOUNTER — Encounter: Payer: Self-pay | Admitting: Physical Medicine & Rehabilitation

## 2011-11-13 NOTE — Telephone Encounter (Signed)
Called to pharm, added note for appt

## 2011-11-25 ENCOUNTER — Encounter: Payer: Self-pay | Admitting: Physical Medicine & Rehabilitation

## 2011-11-25 ENCOUNTER — Ambulatory Visit (HOSPITAL_BASED_OUTPATIENT_CLINIC_OR_DEPARTMENT_OTHER): Payer: Medicare Other | Admitting: Physical Medicine & Rehabilitation

## 2011-11-25 ENCOUNTER — Encounter: Payer: Medicare Other | Attending: Physical Medicine & Rehabilitation

## 2011-11-25 VITALS — BP 140/84 | HR 95 | Resp 14 | Ht 65.0 in | Wt 114.0 lb

## 2011-11-25 DIAGNOSIS — M79609 Pain in unspecified limb: Secondary | ICD-10-CM | POA: Insufficient documentation

## 2011-11-25 DIAGNOSIS — M961 Postlaminectomy syndrome, not elsewhere classified: Secondary | ICD-10-CM | POA: Insufficient documentation

## 2011-11-25 DIAGNOSIS — R209 Unspecified disturbances of skin sensation: Secondary | ICD-10-CM | POA: Insufficient documentation

## 2011-11-25 DIAGNOSIS — R29898 Other symptoms and signs involving the musculoskeletal system: Secondary | ICD-10-CM | POA: Insufficient documentation

## 2011-11-25 DIAGNOSIS — M899 Disorder of bone, unspecified: Secondary | ICD-10-CM | POA: Insufficient documentation

## 2011-11-25 DIAGNOSIS — M216X9 Other acquired deformities of unspecified foot: Secondary | ICD-10-CM | POA: Insufficient documentation

## 2011-11-25 DIAGNOSIS — M48061 Spinal stenosis, lumbar region without neurogenic claudication: Secondary | ICD-10-CM

## 2011-11-25 DIAGNOSIS — M949 Disorder of cartilage, unspecified: Secondary | ICD-10-CM | POA: Insufficient documentation

## 2011-11-25 DIAGNOSIS — B0229 Other postherpetic nervous system involvement: Secondary | ICD-10-CM

## 2011-11-25 DIAGNOSIS — IMO0002 Reserved for concepts with insufficient information to code with codable children: Secondary | ICD-10-CM

## 2011-11-25 MED ORDER — LIDOCAINE 5 % EX PTCH: 1.0000 | MEDICATED_PATCH | CUTANEOUS | Status: AC

## 2011-11-25 NOTE — Patient Instructions (Addendum)
New medicine, Lidoderm will need prior authorization Post herpetic neuralgia MRI, Lumbar spine You'll see me in 2-3 wks

## 2011-11-25 NOTE — Progress Notes (Signed)
Subjective:    Patient ID: [name], female    DOB: 04-Feb-1951, 61 y.o.   MRN: [id number]  HPI 3 month history of right lower extremity pain. She had an episode of shingles in July. She also had a fall. The fall occurred prior to the diagnosis of shingles. X-rays of the knee and right ankle were normal except for some osteopenia Right lateral leg numbness. Has worn bilateral AFOs since lumbar surgery in 2012 Pain Inventory Average Pain 10 Pain Right Now 10 My pain is sharp, burning and stabbing  In the last 24 hours, has pain interfered with the following? General activity 10 Relation with others 10 Enjoyment of life 10 What TIME of day is your pain at its worst? morning and evening Sleep (in general) Fair  Pain is worse with: sitting Pain improves with: medication Relief from Meds: 2  Mobility how many minutes can you walk? 0 ability to climb steps?  no do you drive?  no use a wheelchair Do you have any goals in this area?  yes  Function disabled: date disabled 68 retired I need assistance with the following:  household duties Do you have any goals in this area?  yes  Neuro/Psych numbness trouble walking  Prior Studies x-rays  Physicians involved in your care Jenna Kin, Ivor Costa   Family History  Problem Relation Age of Onset  . Anesthesia problems Neg Hx   . Hypotension Neg Hx   . Malignant hyperthermia Neg Hx   . Pseudochol deficiency Neg Hx   . Diabetes Father   . Stroke Mother    History   Social History  . Marital Status: Legally Separated    Spouse Name: N/A    Number of Children: 2  . Years of Education: 10th grade   Occupational History  . Retired     previously worked as a Producer, television/film/video History Main Topics  . Smoking status: Current Some Day Smoker -- 0.5 packs/day for 48 years    Types: Cigarettes    Last Attempt to Quit: 08/17/2010  . Smokeless tobacco: Never Used  . Alcohol Use: No     1 pint on Fridays  .  Drug Use: No  . Sexually Active: No   Other Topics Concern  . None   Social History Narrative   Lives in [place] with daughter. Has 2 On HD since for 20 years. Worked as a Sports coach. Went to 10th grade. Can read and write.    Past Surgical History  Procedure Date  . Nephrectomy 2010    right side done at [facility] on transplant list  . Umbilical hernia repair age 24- 24  . Appendectomy 1960's  . Vascular surgery     right arm dialysis graft  . Tonsillectomy     as a child  . Rib resection     d/t kidney removal  . Back surgery 01/30/2011    L4-S1 lumbar laminectomy facetectomy and foraminotomies for decompression a bilateral L4-5 and L5-S1 posterior lumbar interbody arthrodesis and bilateral L4-S1 posterior lateral arthrodesis for lumbar stenosis dynamic lumbar spondylolisthesis lumbar spondylosis lumbar radiculopathy with foot drop  . Laparotomy 02/09/2011    Procedure: EXPLORATORY LAPAROTOMY;  Surgeon: Harl Bowie, MD;  Location: [place];  Service: General;  Laterality: N/A;  . Partial colectomy 02/09/2011    Reason for surgery: ischemic colitis of right hemicolon; Surgeon: Harl Bowie, MD;  Location: [place];  Service: General;  Laterality: Right;  . Colostomy 02/09/2011  Reason for surgery: ischemic colitis of right hemicolon requiring partial colectomy and ileostomy.  Surgeon: Harl Bowie, MD;  Location: [place];  Service: General;  Laterality: Right;  . Colonoscopy 02/19/2011    Procedure: COLONOSCOPY;  Surgeon: Missy Sabins, MD;  Location: [place];  Service: Endoscopy;  Laterality: N/A;  . Colonoscopy 03/07/2011    Procedure: COLONOSCOPY;  Surgeon: Cleotis Nipper, MD;  Location: [facility] ENDOSCOPY;  Service: Endoscopy;  Laterality: N/A;  no prep needed--pt has long Hartmann's pouch and ileostomy  . Total parathyroidectomy 01/17/2001    with autotransplantation into left forearm // due to secondary hyperparathyroidism from ESRD  . Av fistula placement 04/08/2011     Procedure: INSERTION OF ARTERIOVENOUS (AV) GORE-TEX GRAFT ARM;  Surgeon: Rosetta Posner, MD;  Location: [facility] OR;  Service: Vascular;  Laterality: Right;  Revision of Upper Arm Gore-Tex Graft  . Ileostomy closure 05/29/2011    Procedure: ILEOSTOMY TAKEDOWN;  Surgeon: Harl Bowie, MD;  Location: [place];  Service: General;  Laterality: N/A;   Past Medical History  Diagnosis Date  . Seizures     r/t HTN in 1990's x 1  . Brain aneurysm     No records could be found  . Blood transfusion 1990's    r/t Kidney removal surgery  . Arthritis     Back  . Anemia     anemia of chronic disease likely 2/2 ESRD per last anemia panel (02/2011) with Fe 35, TIBC 167, ferritin 2041  // BL Hgb 8-10  . Umbilical hernia age 78  . Heart murmur     Born with heart mumur, does not require follow up per pt  . Hypertension     Does not see a heart doctor, had pre transplant stress test at [facility]    . End stage renal disease on dialysis     Secondary to hypertension // T/Th/Sat dialysis on [employer]  . Hepatitis C   . Colitis, ischemic 01/2011    S/P partial colectomy of right hemicolon and ileostomy placement  . Lumbar spinal stenosis      bilateral L4-5 and L5-S1 posterior lumbar interbody arthrodesis and bilateral L4-S1 posterior lateral arthrodesis for lumbar stenosis dynamic lumbar spondylolisthesis lumbar spondylosis lumbar radiculopathy with foot drop // s/p surgery 01/2011 - see surgery section for details.  Marland Kitchen Dysrhythmia     SVT for brief period in Feb 2013  . Dialysis patient     [place]  . Diabetes mellitus     Borderline  . Foot drop, bilateral     after back surgery  . Anuria     Due to dialysis  . Aortic stenosis, mild     03/2011 echo   BP 140/84  Pulse 95  Resp 14  Ht 5\' 5"  (1.651 m)  Wt 114 lb (51.71 kg)  BMI 18.97 kg/m2  SpO2 96%     Review of Systems  Musculoskeletal: Positive for myalgias, arthralgias and gait problem.  Neurological:  Positive for numbness.  All other systems reviewed and are negative.       Objective:   Physical Exam  Constitutional: She is oriented to person, place, and time. She appears well-developed.       Thin   HENT:  Head: Normocephalic and atraumatic.  Eyes: Pupils are equal, round, and reactive to light.  Neck: Normal range of motion.  Musculoskeletal:       Hip knee and ankle range of motion are normal passively.  Neurological: She is alert and oriented to person, place, and time. A sensory deficit is present. Gait abnormal.  Reflex Scores:      Tricep reflexes are 1+ on the right side and 1+ on the left side.      Bicep reflexes are 1+ on the right side and 1+ on the left side.      Brachioradialis reflexes are 1+ on the right side and 1+ on the left side.      Patellar reflexes are 0 on the right side and 1+ on the left side.      Achilles reflexes are 0 on the right side and 0 on the left side.      Bilateral AFOs her ambulation Straight leg raising test negative Sensation reduced bilateral L5 dermatome as well as right L4 dermatome.  Motor strength is 2 minus/5 in the right quad, 0/5 bilateral ankle dorsiflexors. As well as EHL Left quad is 4 minus/5. Bilateral upper extremities are 4 minus/5.  Skin:       Healed  Shingles lesions in the right L4 dermatome  Psychiatric: She has a normal mood and affect.          Assessment & Plan:  1. Lumbar spinal stenosis with bilateral foot drop. She also has new or onset right L4 distribution numbness but also increased weakness in the right leg. Her sensory symptoms are likely related to the postherpetic neuralgia however the motor symptoms may be due to some increased spinal nerve problems. Will check MRI for this.    will prescribe Lidoderm patch for postherpetic neuralgia

## 2011-12-02 ENCOUNTER — Ambulatory Visit (HOSPITAL_COMMUNITY)
Admission: RE | Admit: 2011-12-02 | Discharge: 2011-12-02 | Disposition: A | Discharge status: Home / Self Care | Payer: Medicare Other | Source: Ambulatory Visit | Attending: Physical Medicine & Rehabilitation | Admitting: Physical Medicine & Rehabilitation

## 2011-12-02 DIAGNOSIS — Z992 Dependence on renal dialysis: Secondary | ICD-10-CM | POA: Insufficient documentation

## 2011-12-02 DIAGNOSIS — T84498A Other mechanical complication of other internal orthopedic devices, implants and grafts, initial encounter: Secondary | ICD-10-CM | POA: Insufficient documentation

## 2011-12-02 DIAGNOSIS — R29898 Other symptoms and signs involving the musculoskeletal system: Secondary | ICD-10-CM | POA: Insufficient documentation

## 2011-12-02 DIAGNOSIS — Z981 Arthrodesis status: Secondary | ICD-10-CM | POA: Insufficient documentation

## 2011-12-02 DIAGNOSIS — M48061 Spinal stenosis, lumbar region without neurogenic claudication: Secondary | ICD-10-CM

## 2011-12-02 DIAGNOSIS — R262 Difficulty in walking, not elsewhere classified: Secondary | ICD-10-CM | POA: Insufficient documentation

## 2011-12-02 DIAGNOSIS — Y831 Surgical operation with implant of artificial internal device as the cause of abnormal reaction of the patient, or of later complication, without mention of misadventure at the time of the procedure: Secondary | ICD-10-CM | POA: Insufficient documentation

## 2011-12-05 ENCOUNTER — Other Ambulatory Visit: Payer: Self-pay | Admitting: *Deleted

## 2011-12-05 DIAGNOSIS — M79606 Pain in leg, unspecified: Secondary | ICD-10-CM

## 2011-12-05 MED ORDER — HYDROCODONE-ACETAMINOPHEN 5-325 MG PO TABS: 1.0000 | ORAL_TABLET | Freq: Four times a day (QID) | ORAL | Status: DC | PRN

## 2011-12-05 NOTE — Telephone Encounter (Signed)
Rx called in to pharmacy. 

## 2011-12-09 ENCOUNTER — Encounter: Payer: Self-pay | Admitting: Physical Medicine & Rehabilitation

## 2011-12-09 ENCOUNTER — Ambulatory Visit (HOSPITAL_BASED_OUTPATIENT_CLINIC_OR_DEPARTMENT_OTHER): Payer: Medicare Other | Admitting: Physical Medicine & Rehabilitation

## 2011-12-09 VITALS — BP 123/76 | HR 89 | Ht 65.0 in | Wt 111.2 lb

## 2011-12-09 DIAGNOSIS — M5416 Radiculopathy, lumbar region: Secondary | ICD-10-CM

## 2011-12-09 DIAGNOSIS — IMO0002 Reserved for concepts with insufficient information to code with codable children: Secondary | ICD-10-CM

## 2011-12-09 NOTE — Patient Instructions (Signed)
Your next visit will be for an epidural injection If the epidural is not helpful we may need to do  EMG test You'll get your pain medicines from your family doctor

## 2011-12-09 NOTE — Progress Notes (Signed)
Subjective:    Patient ID: [name], female    DOB: Aug 28, 1950, 61 y.o.   MRN: [id number]  HPI 3 month history of right lower extremity pain. She had an episode of shingles in July. She also had a fall. The fall occurred prior to the diagnosis of shingles. X-rays of the knee and right ankle were normal except for some osteopenia Right lateral leg numbness. Has worn bilateral AFOs since lumbar surgery in 2012  I reviewed the MRI with the patient. Is evidence of right L4 nerve root encroachment as well as her previous fusions at L4-L5 and L5-S1. In addition there is facet arthropathy at L2-3 and L3-4 Pain Inventory Average Pain 10 Pain Right Now 10 My pain is sharp, burning, stabbing and aching  In the last 24 hours, has pain interfered with the following? General activity 10 Relation with others 4 Enjoyment of life 10 What TIME of day is your pain at its worst? morning and daytime Sleep (in general) Poor  Pain is worse with: bending, sitting, standing and some activites Pain improves with: medication Relief from Meds: 6  Mobility how many minutes can you walk? 0 ability to climb steps?  no do you drive?  no use a wheelchair  Function disabled: date disabled 1989 I need assistance with the following:  household duties  Neuro/Psych weakness numbness trouble walking  Prior Studies Any changes since last visit?  no  Physicians involved in your care Any changes since last visit?  no   Family History  Problem Relation Age of Onset  . Anesthesia problems Neg Hx   . Hypotension Neg Hx   . Malignant hyperthermia Neg Hx   . Pseudochol deficiency Neg Hx   . Diabetes Father   . Stroke Mother    History   Social History  . Marital Status: Legally Separated    Spouse Name: N/A    Number of Children: 2  . Years of Education: 10th grade   Occupational History  . Retired     previously worked as a Producer, television/film/video History Main Topics  . Smoking status:  Current Some Day Smoker -- 0.5 packs/day for 48 years    Types: Cigarettes    Last Attempt to Quit: 08/17/2010  . Smokeless tobacco: Never Used  . Alcohol Use: No     1 pint on Fridays  . Drug Use: No  . Sexually Active: No   Other Topics Concern  . None   Social History Narrative   Lives in [place] with daughter. Has 2 On HD since for 20 years. Worked as a Sports coach. Went to 10th grade. Can read and write.    Past Surgical History  Procedure Date  . Nephrectomy 2010    right side done at [facility] on transplant list  . Umbilical hernia repair age 24- 84  . Appendectomy 1960's  . Vascular surgery     right arm dialysis graft  . Tonsillectomy     as a child  . Rib resection     d/t kidney removal  . Back surgery 01/30/2011    L4-S1 lumbar laminectomy facetectomy and foraminotomies for decompression a bilateral L4-5 and L5-S1 posterior lumbar interbody arthrodesis and bilateral L4-S1 posterior lateral arthrodesis for lumbar stenosis dynamic lumbar spondylolisthesis lumbar spondylosis lumbar radiculopathy with foot drop  . Laparotomy 02/09/2011    Procedure: EXPLORATORY LAPAROTOMY;  Surgeon: Harl Bowie, MD;  Location: [place];  Service: General;  Laterality: N/A;  . Partial colectomy  02/09/2011    Reason for surgery: ischemic colitis of right hemicolon; Surgeon: Harl Bowie, MD;  Location: [place];  Service: General;  Laterality: Right;  . Colostomy 02/09/2011    Reason for surgery: ischemic colitis of right hemicolon requiring partial colectomy and ileostomy.  Surgeon: Harl Bowie, MD;  Location: [place];  Service: General;  Laterality: Right;  . Colonoscopy 02/19/2011    Procedure: COLONOSCOPY;  Surgeon: Missy Sabins, MD;  Location: [place];  Service: Endoscopy;  Laterality: N/A;  . Colonoscopy 03/07/2011    Procedure: COLONOSCOPY;  Surgeon: Cleotis Nipper, MD;  Location: [facility] ENDOSCOPY;  Service: Endoscopy;  Laterality: N/A;  no prep needed--pt has long Hartmann's  pouch and ileostomy  . Total parathyroidectomy 01/17/2001    with autotransplantation into left forearm // due to secondary hyperparathyroidism from ESRD  . Av fistula placement 04/08/2011    Procedure: INSERTION OF ARTERIOVENOUS (AV) GORE-TEX GRAFT ARM;  Surgeon: Rosetta Posner, MD;  Location: [facility] OR;  Service: Vascular;  Laterality: Right;  Revision of Upper Arm Gore-Tex Graft  . Ileostomy closure 05/29/2011    Procedure: ILEOSTOMY TAKEDOWN;  Surgeon: Harl Bowie, MD;  Location: [place];  Service: General;  Laterality: N/A;   Past Medical History  Diagnosis Date  . Seizures     r/t HTN in 1990's x 1  . Brain aneurysm     No records could be found  . Blood transfusion 1990's    r/t Kidney removal surgery  . Arthritis     Back  . Anemia     anemia of chronic disease likely 2/2 ESRD per last anemia panel (02/2011) with Fe 35, TIBC 167, ferritin 2041  // BL Hgb 8-10  . Umbilical hernia age 31  . Heart murmur     Born with heart mumur, does not require follow up per pt  . Hypertension     Does not see a heart doctor, had pre transplant stress test at [facility]    . End stage renal disease on dialysis     Secondary to hypertension // T/Th/Sat dialysis on [employer]  . Hepatitis C   . Colitis, ischemic 01/2011    S/P partial colectomy of right hemicolon and ileostomy placement  . Lumbar spinal stenosis      bilateral L4-5 and L5-S1 posterior lumbar interbody arthrodesis and bilateral L4-S1 posterior lateral arthrodesis for lumbar stenosis dynamic lumbar spondylolisthesis lumbar spondylosis lumbar radiculopathy with foot drop // s/p surgery 01/2011 - see surgery section for details.  Marland Kitchen Dysrhythmia     SVT for brief period in Feb 2013  . Dialysis patient     [place]  . Diabetes mellitus     Borderline  . Foot drop, bilateral     after back surgery  . Anuria     Due to dialysis  . Aortic stenosis, mild     03/2011 echo   BP 123/76  Pulse 89  Ht  5\' 5"  (1.651 m)  Wt 111 lb 3.2 oz (50.44 kg)  BMI 18.50 kg/m2  SpO2 97%    Review of Systems  Musculoskeletal: Positive for gait problem.  Neurological: Positive for weakness and numbness.  All other systems reviewed and are negative.       Objective:   Physical Exam  Gen. No acute distress Motor strength is 2 minus in the right quad and hip flexor 3 minus in the abductors abductors Bilateral foot drop chronic. Left lower extremity has 4  at the hip flexor knee extensor. Deep tendon reflexes 0 at the right patellar 2 at the left patellar      Assessment & Plan:  1. Lumbar postlaminectomy syndrome has chronic foot drop bilaterally. Patient states that the right leg weakness is new were and has occurred over last 3 months. The MRI showed some nerve root encroachment at right L4 but certainly does not appear to be enough to be causing all the weakness. There may be pain inhibition.  We'll do epidural injection right L4 transforaminal Pain medications from primary care I explained this to the patient and have reviewed the last primary care note dated 12/05/2011 If epidural not tickly helpful I will do an EMG to further assess

## 2011-12-11 ENCOUNTER — Other Ambulatory Visit: Payer: Self-pay | Admitting: Neurosurgery

## 2011-12-11 DIAGNOSIS — M4310 Spondylolisthesis, site unspecified: Secondary | ICD-10-CM

## 2011-12-11 DIAGNOSIS — M48061 Spinal stenosis, lumbar region without neurogenic claudication: Secondary | ICD-10-CM

## 2011-12-11 DIAGNOSIS — M47817 Spondylosis without myelopathy or radiculopathy, lumbosacral region: Secondary | ICD-10-CM

## 2011-12-27 ENCOUNTER — Ambulatory Visit: Payer: Medicare Other | Admitting: Physical Medicine & Rehabilitation

## 2011-12-27 ENCOUNTER — Encounter: Payer: Self-pay | Admitting: Physical Medicine & Rehabilitation

## 2011-12-27 NOTE — Progress Notes (Unsigned)
  [facility]   Name: [name] DOB:[birth year] MRN: [id number]  Date:12/27/2011  Physician: Alysia Penna, MD    Nurse/CMA: Marvell Fuller  Allergies:  Allergies  Allergen Reactions  . Contrast Media (Iodinated Diagnostic Agents)     Seziure  . Iohexol Other (See Comments)    Reaction is convulsions    Consent Signed: yes  Is patient diabetic? no   Pregnant: no LMP: No LMP recorded. Patient is postmenopausal. (age 61-55)  Anticoagulants: She isn't sure Anti-inflammatory: no Antibiotics: no  Procedure: LESI  Position: Prone Start Time: ***  End Time: ***  Fluoro Time: ***  RN/CMA Carroll,CMA Carroll,CMA    Time 1:32pm ***    BP 99/54 ***    Pulse 106 ***    Respirations 16 ***    O2 Sat 97% ***    S/S 6 6    Pain Level 8/10 ***     D/C home with Mateo Flow , patient A & O X 3, D/C instructions reviewed, and sits independently.

## 2011-12-27 NOTE — Patient Instructions (Signed)
We will need to clarify whether or not you received premedication prior to your last dialysis catheter revision. You did receive Omnipaque which is the same medicine we use.

## 2012-01-06 ENCOUNTER — Telehealth: Payer: Self-pay

## 2012-01-06 NOTE — Telephone Encounter (Signed)
Needs to reschedule.

## 2012-01-10 ENCOUNTER — Other Ambulatory Visit: Payer: Self-pay | Admitting: Neurosurgery

## 2012-01-10 DIAGNOSIS — M5137 Other intervertebral disc degeneration, lumbosacral region: Secondary | ICD-10-CM

## 2012-01-10 DIAGNOSIS — M4310 Spondylolisthesis, site unspecified: Secondary | ICD-10-CM

## 2012-01-10 DIAGNOSIS — M48061 Spinal stenosis, lumbar region without neurogenic claudication: Secondary | ICD-10-CM

## 2012-01-10 DIAGNOSIS — M47817 Spondylosis without myelopathy or radiculopathy, lumbosacral region: Secondary | ICD-10-CM

## 2012-01-13 ENCOUNTER — Other Ambulatory Visit: Payer: Self-pay | Admitting: *Deleted

## 2012-01-13 DIAGNOSIS — M79606 Pain in leg, unspecified: Secondary | ICD-10-CM

## 2012-01-13 MED ORDER — HYDROCODONE-ACETAMINOPHEN 5-325 MG PO TABS: 1.0000 | ORAL_TABLET | Freq: Four times a day (QID) | ORAL | Status: DC | PRN

## 2012-01-14 ENCOUNTER — Ambulatory Visit: Payer: Medicare Other | Admitting: Physical Medicine & Rehabilitation

## 2012-01-15 ENCOUNTER — Other Ambulatory Visit: Payer: Medicare Other

## 2012-01-15 NOTE — Telephone Encounter (Signed)
Called to pharm 

## 2012-01-17 ENCOUNTER — Ambulatory Visit (HOSPITAL_BASED_OUTPATIENT_CLINIC_OR_DEPARTMENT_OTHER): Payer: Medicare Other | Admitting: Physical Medicine & Rehabilitation

## 2012-01-17 ENCOUNTER — Encounter: Payer: Medicare Other | Attending: Physical Medicine & Rehabilitation

## 2012-01-17 ENCOUNTER — Encounter: Payer: Self-pay | Admitting: Physical Medicine & Rehabilitation

## 2012-01-17 VITALS — BP 100/67 | HR 101 | Resp 14 | Ht 65.0 in | Wt 109.6 lb

## 2012-01-17 DIAGNOSIS — IMO0002 Reserved for concepts with insufficient information to code with codable children: Secondary | ICD-10-CM

## 2012-01-17 DIAGNOSIS — M216X9 Other acquired deformities of unspecified foot: Secondary | ICD-10-CM | POA: Insufficient documentation

## 2012-01-17 DIAGNOSIS — M899 Disorder of bone, unspecified: Secondary | ICD-10-CM | POA: Insufficient documentation

## 2012-01-17 DIAGNOSIS — M949 Disorder of cartilage, unspecified: Secondary | ICD-10-CM | POA: Insufficient documentation

## 2012-01-17 DIAGNOSIS — M961 Postlaminectomy syndrome, not elsewhere classified: Secondary | ICD-10-CM | POA: Insufficient documentation

## 2012-01-17 DIAGNOSIS — R209 Unspecified disturbances of skin sensation: Secondary | ICD-10-CM | POA: Insufficient documentation

## 2012-01-17 DIAGNOSIS — M5416 Radiculopathy, lumbar region: Secondary | ICD-10-CM | POA: Insufficient documentation

## 2012-01-17 DIAGNOSIS — R29898 Other symptoms and signs involving the musculoskeletal system: Secondary | ICD-10-CM | POA: Insufficient documentation

## 2012-01-17 DIAGNOSIS — M79609 Pain in unspecified limb: Secondary | ICD-10-CM | POA: Insufficient documentation

## 2012-01-17 NOTE — Progress Notes (Signed)
  [facility]   Name: [name] DOB:1950-04-11 MRN: [id number]  Date:01/17/2012  Physician: Alysia Penna, MD    Nurse/CMA: Payge Eppes RN  Allergies:  Allergies  Allergen Reactions  . Contrast Media (Iodinated Diagnostic Agents)     Seziure  . Iohexol Other (See Comments)    Reaction is convulsions    Consent Signed: yes  Is patient diabetic? yes  CBG today?   Pregnant: no LMP: No LMP recorded. Patient is postmenopausal. (age 39-55)  Anticoagulants: only during dialysis Anti-inflammatory: no Antibiotics: no  Procedure: L4 right Transforaminal Lumbar Epidural Steroid Injection Position: Prone Start Time:10:02  End Time: 10:10 Fluoro Time:22 seconds  RN/CMA Biomedical engineer    Time 8:53 10:15    BP 100/67 100/59    Pulse 101 97    Respirations 14 14    O2 Sat 100 98    S/S 6 6    Pain Level 10/10 0/10     D/C home with her daughter, patient A & O X 3, D/C instructions reviewed, and sits independently.

## 2012-01-17 NOTE — Progress Notes (Signed)
Right L4 Lumbar transforaminal epidural steroid injection under fluoroscopic guidance  Indication: Lumbosacral radiculitis is not relieved by medication management or other conservative care and interfering with self-care and mobility.   Informed consent was obtained after describing risk and benefits of the procedure with the patient, this includes bleeding, bruising, infection, paralysis and medication side effects.  The patient wishes to proceed and has given written consent.  Patient was placed in prone position.  The lumbar area was marked and prepped with Betadine.  It was entered with a 25-gauge 1-1/2 inch needle and one mL of 1% lidocaine was injected into the skin and subcutaneous tissue.  Then a 22-gauge 3.5 spinal needle was inserted into the L4-5 intervertebral foramen under AP, lateral, and oblique view.  Then a solution containing one mL of 10 mg per mL dexamethasone and 2 mL of 1% lidocaine was injected.  The patient tolerated procedure well.  Post procedure instructions were given.  Please see post procedure form.  Postprocedure 0/10 pain

## 2012-01-17 NOTE — Patient Instructions (Signed)
Epidural Steroid Injection Care After  Refer to this sheet in the next few weeks. These instructions provide you with information on caring for yourself after your procedure. Your caregiver may also give you more specific instructions. Your treatment has been planned according to current medical practices, but problems sometimes occur. Call your caregiver if you have any problems or questions after your procedure. HOME CARE INSTRUCTIONS   Avoid the use of heat on the injection site for a day.  Do not have a tub bath or soak in water for the rest of the day.  Remove the bandage on the next day.  Resume your normal activities on the next day.  Use ice packs or mild pain relievers to reduce the soreness around the injection site.  Follow up with your caregiver 7 to 10 days after the procedure. SEEK MEDICAL CARE IF:   You develop a fever of more than 100.5 F (38.1 C).  You continue to have pain and soreness over the injection site even after taking medicines.  You develop significant nausea or vomiting. SEEK IMMEDIATE MEDICAL CARE IF:   You have severe back pain, which is not relieved by medicines.  You develop severe headache, stiff neck, or sensitivity to light.  You develop any new numbness or weakness of your legs.  You lose control over your bladder or bowel movements.  You develop a fever of more than 102 F (38.9 C).  You develop difficulty breathing. Document Released: 06/19/2010 Document Revised: 05/27/2011 Document Reviewed: 06/19/2010 Exit[employer] Patient Information 2013 [employer].  

## 2012-01-20 ENCOUNTER — Other Ambulatory Visit: Payer: Medicare Other

## 2012-01-24 ENCOUNTER — Other Ambulatory Visit: Payer: Medicare Other

## 2012-01-29 ENCOUNTER — Ambulatory Visit
Admission: RE | Admit: 2012-01-29 | Discharge: 2012-01-29 | Disposition: A | Discharge status: Home / Self Care | Payer: Medicare Other | Source: Ambulatory Visit | Attending: Neurosurgery | Admitting: Neurosurgery

## 2012-01-29 ENCOUNTER — Other Ambulatory Visit: Payer: Medicare Other

## 2012-01-29 DIAGNOSIS — M5137 Other intervertebral disc degeneration, lumbosacral region: Secondary | ICD-10-CM

## 2012-01-29 DIAGNOSIS — M48061 Spinal stenosis, lumbar region without neurogenic claudication: Secondary | ICD-10-CM

## 2012-01-29 DIAGNOSIS — M47817 Spondylosis without myelopathy or radiculopathy, lumbosacral region: Secondary | ICD-10-CM

## 2012-01-29 DIAGNOSIS — M4310 Spondylolisthesis, site unspecified: Secondary | ICD-10-CM

## 2012-02-17 ENCOUNTER — Ambulatory Visit: Payer: Medicare Other | Admitting: Physical Medicine & Rehabilitation

## 2012-02-17 DIAGNOSIS — D509 Iron deficiency anemia, unspecified: Secondary | ICD-10-CM | POA: Insufficient documentation

## 2012-03-31 ENCOUNTER — Encounter: Payer: Self-pay | Admitting: Internal Medicine

## 2012-04-24 ENCOUNTER — Other Ambulatory Visit (HOSPITAL_COMMUNITY): Payer: Self-pay | Admitting: Nephrology

## 2012-04-24 DIAGNOSIS — N186 End stage renal disease: Secondary | ICD-10-CM

## 2012-04-29 ENCOUNTER — Ambulatory Visit (HOSPITAL_COMMUNITY): Payer: Medicare Other

## 2012-05-15 DIAGNOSIS — R269 Unspecified abnormalities of gait and mobility: Secondary | ICD-10-CM | POA: Insufficient documentation

## 2012-05-15 DIAGNOSIS — M216X9 Other acquired deformities of unspecified foot: Secondary | ICD-10-CM | POA: Insufficient documentation

## 2012-09-30 ENCOUNTER — Encounter: Payer: Self-pay | Admitting: Neurology

## 2012-09-30 DIAGNOSIS — M216X9 Other acquired deformities of unspecified foot: Secondary | ICD-10-CM

## 2012-09-30 DIAGNOSIS — R269 Unspecified abnormalities of gait and mobility: Secondary | ICD-10-CM

## 2012-09-30 DIAGNOSIS — M6281 Muscle weakness (generalized): Secondary | ICD-10-CM

## 2012-10-05 ENCOUNTER — Encounter: Payer: Self-pay | Admitting: Neurology

## 2012-10-05 ENCOUNTER — Ambulatory Visit: Payer: Self-pay | Admitting: Neurology

## 2012-11-10 ENCOUNTER — Encounter: Payer: Self-pay | Admitting: Internal Medicine

## 2013-02-18 ENCOUNTER — Other Ambulatory Visit: Payer: Self-pay | Admitting: Nephrology

## 2013-02-18 DIAGNOSIS — R1032 Left lower quadrant pain: Secondary | ICD-10-CM

## 2013-02-22 ENCOUNTER — Other Ambulatory Visit: Payer: Medicare Other

## 2013-02-26 ENCOUNTER — Other Ambulatory Visit: Payer: Medicare Other

## 2013-03-20 IMAGING — CR DG CHEST 2V
2 series · 2 of 2 positions shown · non-contrast
Comparison: 11/23/2004.  11/11/2003.

CLINICAL DATA: History of hypertension.

CHEST - 2 VIEW

[view not recorded (1 of 2)]
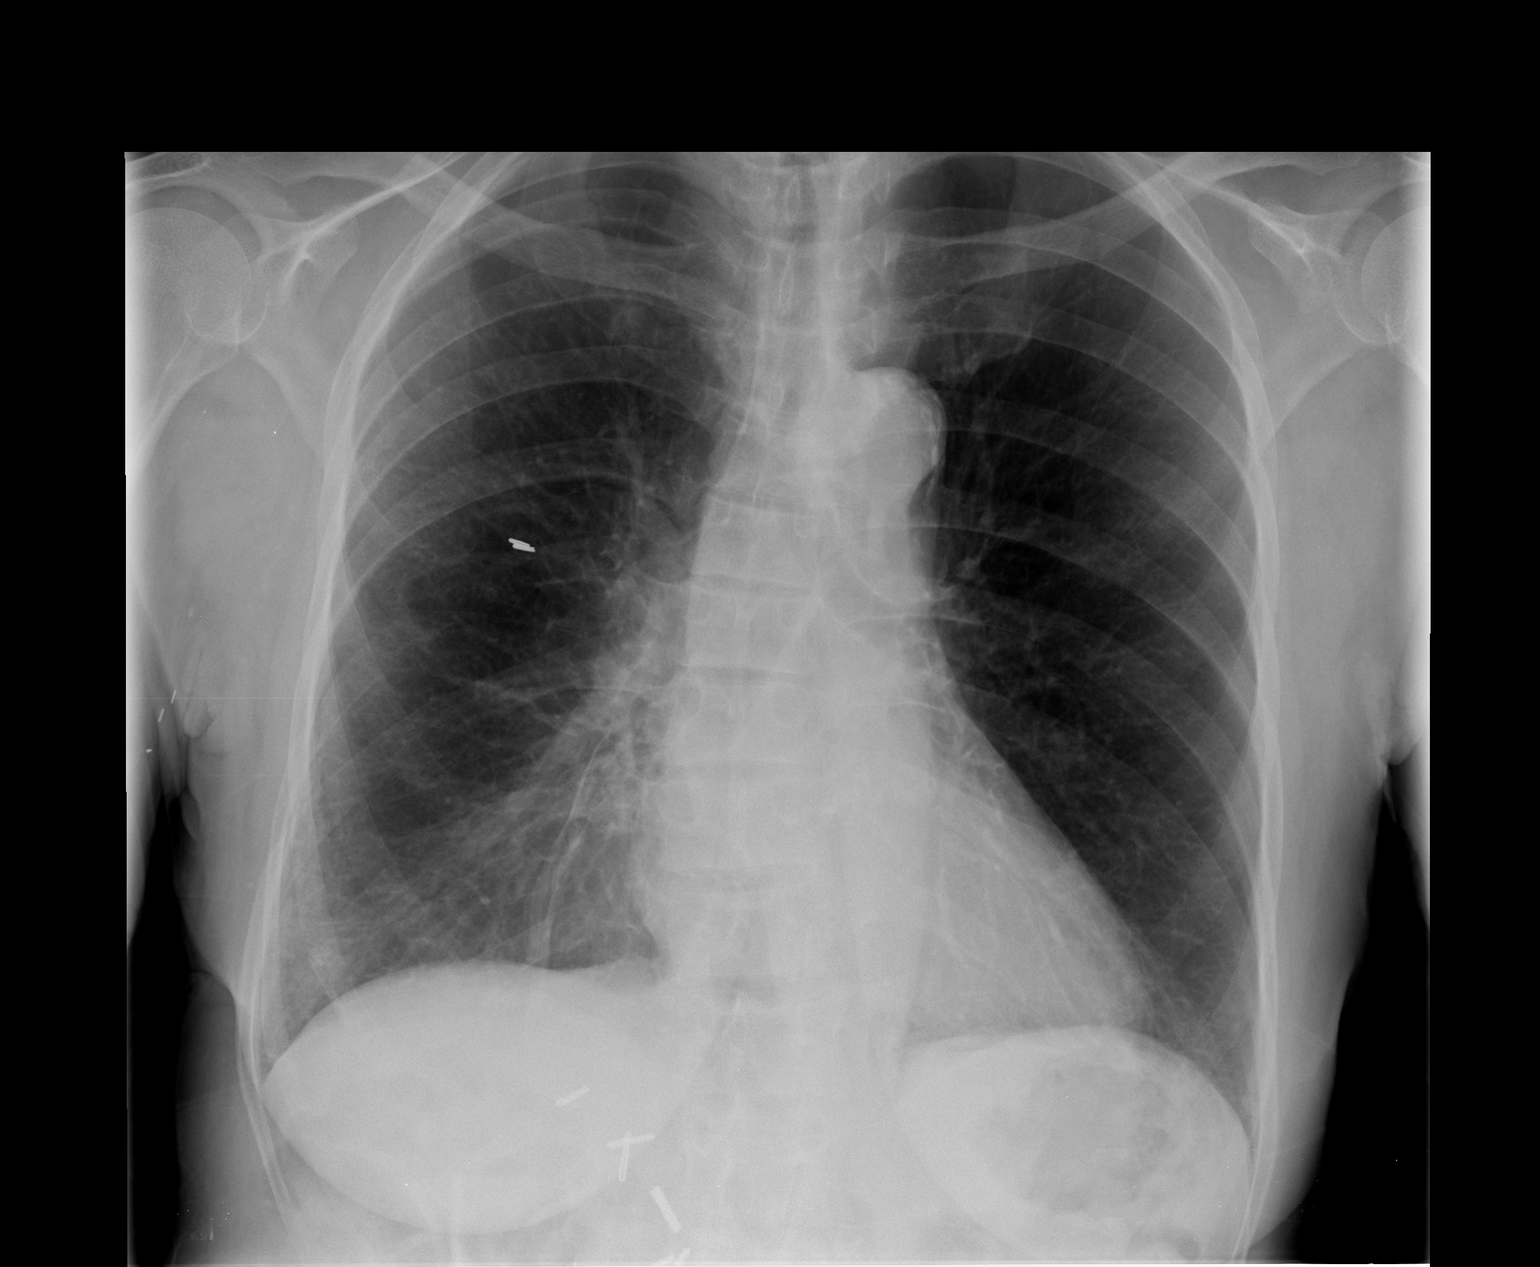

[view not recorded (2 of 2)]
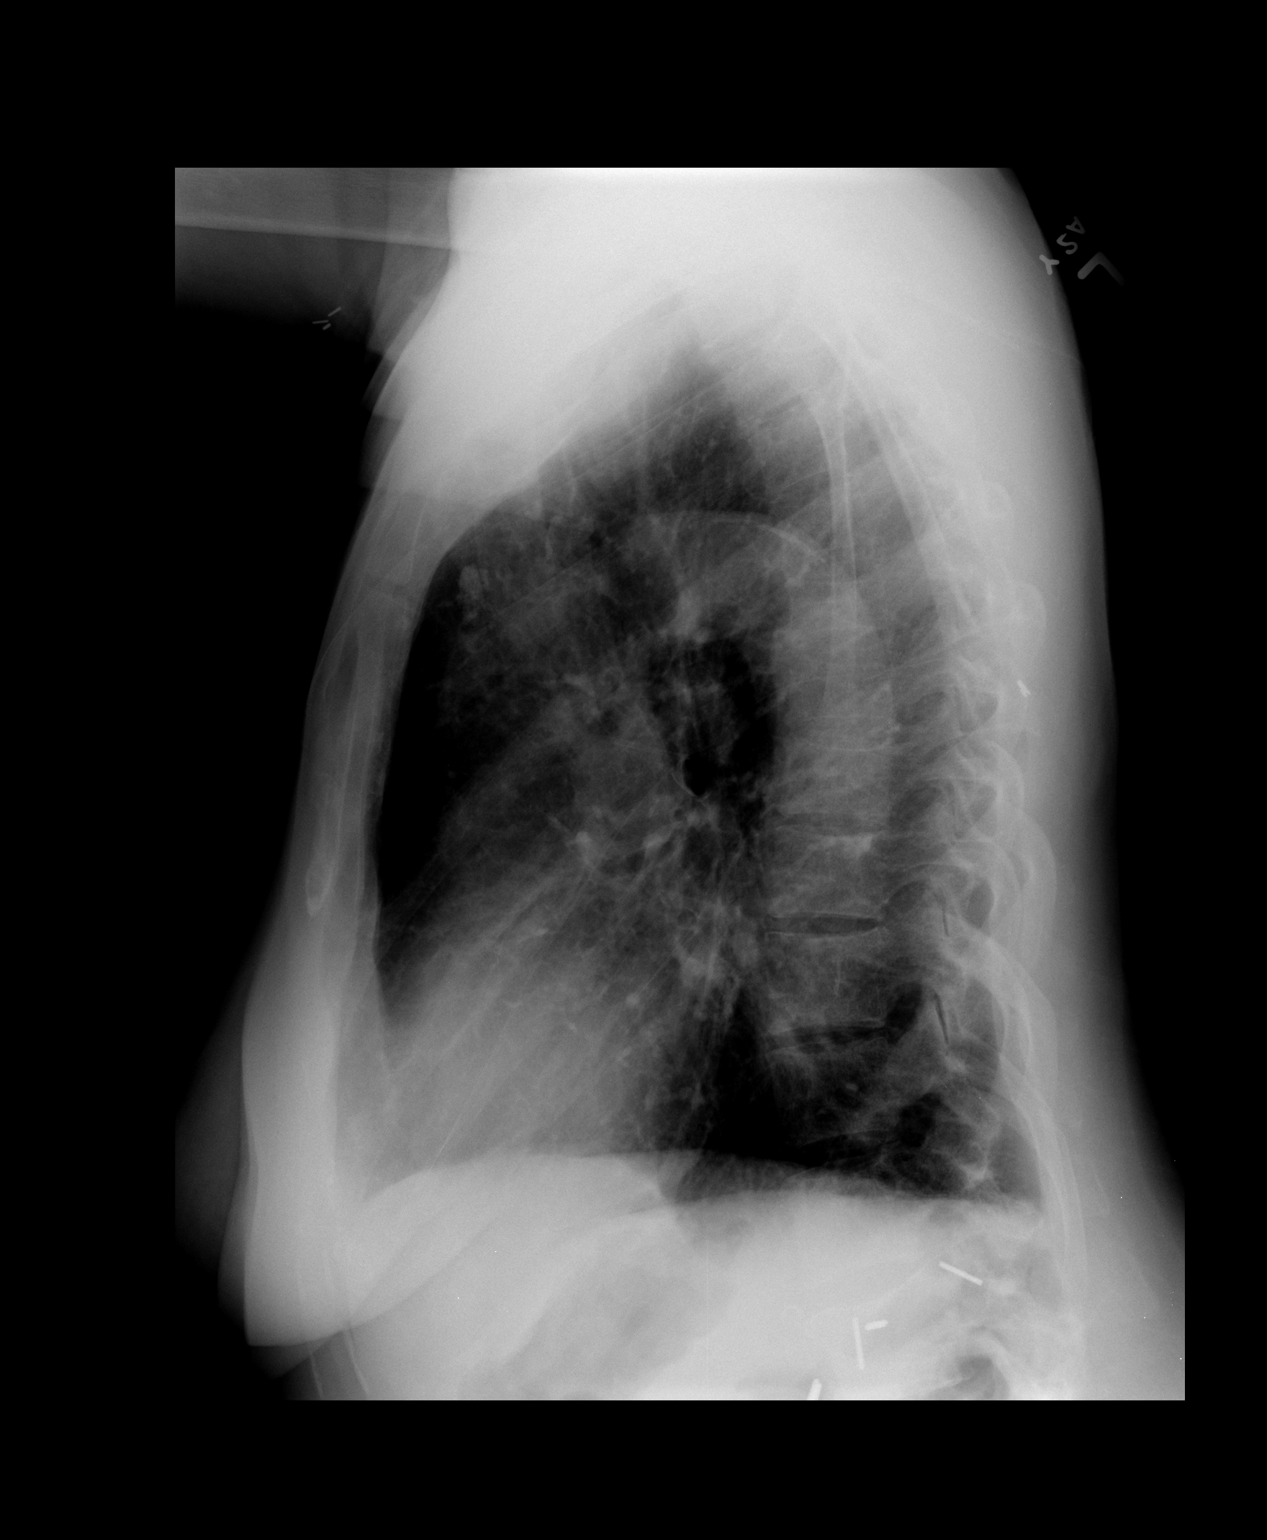

[2 of 2 positions shown; findings below may reference images not displayed]

FINDINGS: Cardiac silhouette is borderline size. Ectasia and
nonaneurysmal calcification of the thoracic aorta are seen.
Subsegmental atelectasis or fibrosis is seen in the right medial
base.  No pulmonary edema, pneumonia, or pleural effusion is seen.
There is flattening of the diaphragm on lateral image.  There is
overall generalized hyperinflation configuration.  Surgical staples
are seen in the upper abdomen, in the soft tissues of the right
posterior back and in the right axillary region.  On the lateral
image stable chronic nodularity is seen in the retrosternal region
most likely reflecting granulomas.  No evidence of active
granulomatous process is evident.
IMPRESSION: Hyperinflation configuration consistent with COPD.  Infiltrate and
subsegmental atelectasis or fibrosis in right medial base.
Borderline cardiac size.  No pulmonary edema or pleural effusion.

## 2013-03-25 IMAGING — RF DG LUMBAR SPINE 2-3V
1 series · 2 of 2 positions shown · non-contrast
Comparison: Intraoperative radiographs from 4595 hours the same day
and earlier.

Fluoroscopy time of 0.5 minutes was utilized.

CLINICAL DATA: 60-year-old female undergoing lumbar spine surgery.

LUMBAR SPINE - 2-3 VIEW

[Series 1: run · 2 of 2 slices shown]
[im 1/2]
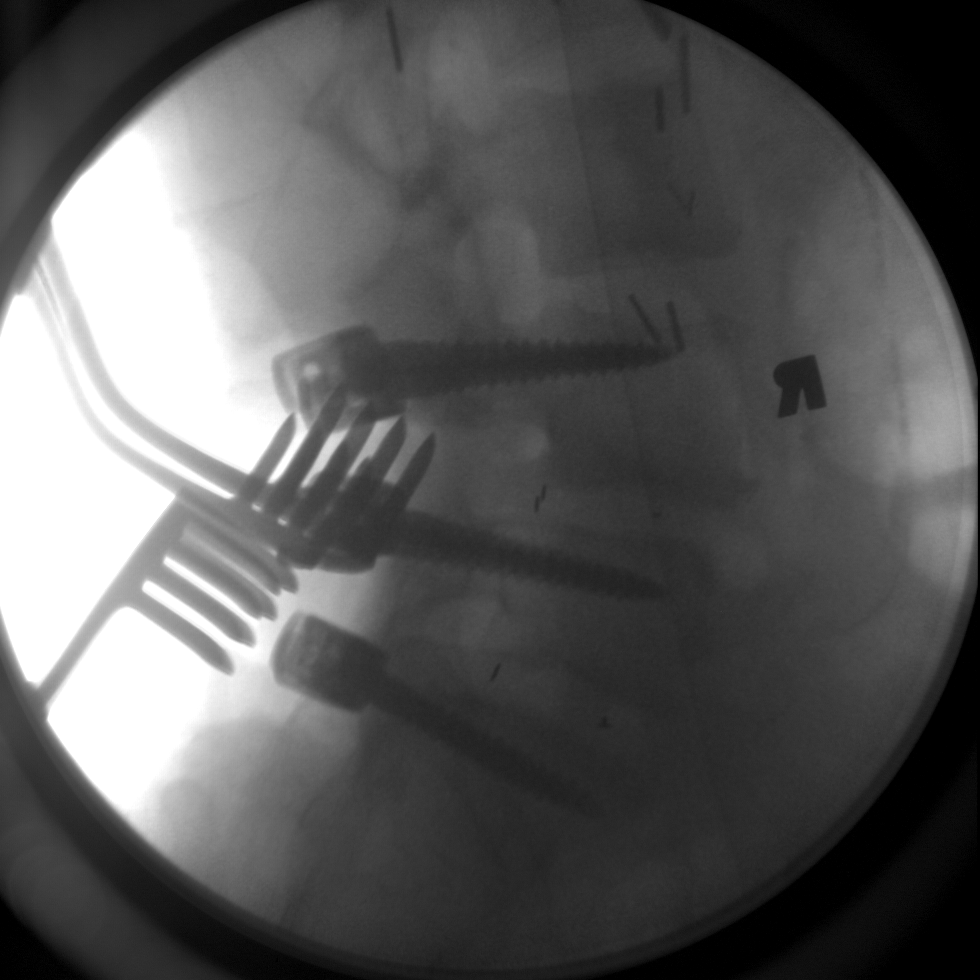
[im 2/2]
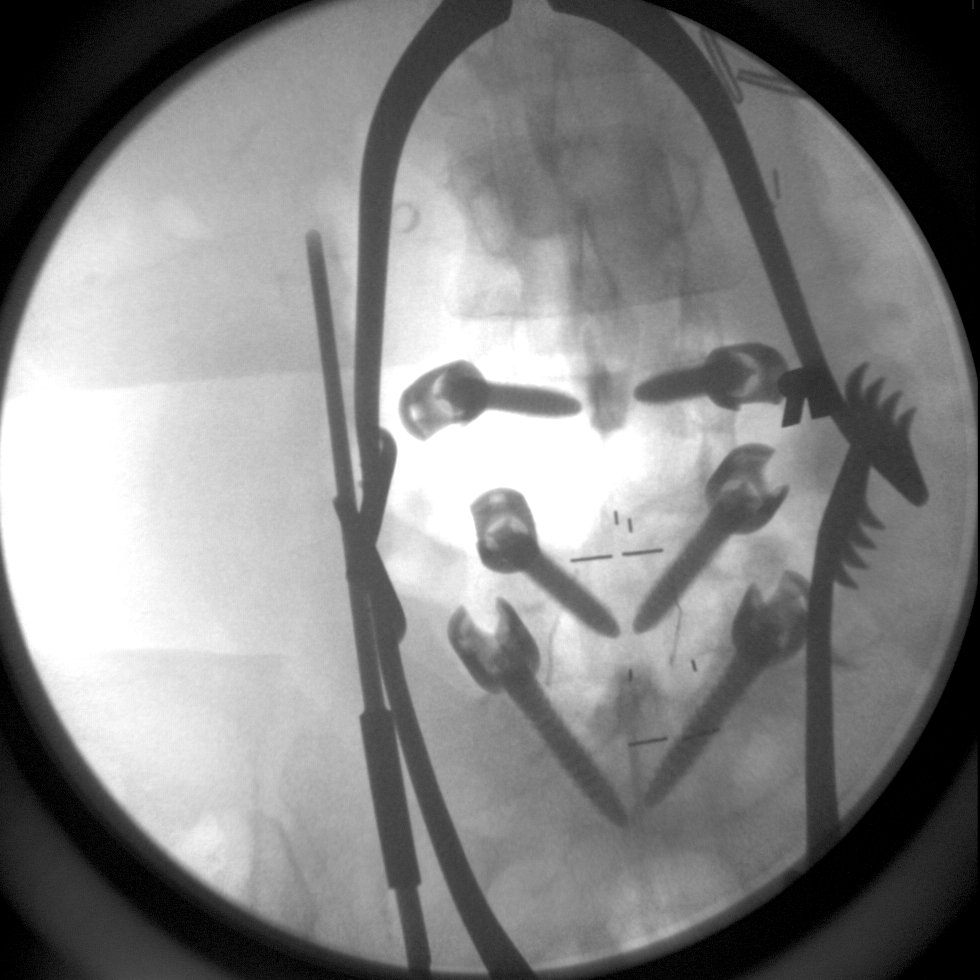

[2 of 2 positions shown; findings below may reference images not displayed]

FINDINGS: Intraoperative frontal and lateral fluoroscopic views of
the lower lumbar spine demonstrate bilateral transpedicular
hardware placement at L4, L5, and S1.  Interbody implant at L4-L5
and L5-S1.  Anterolisthesis of L4 on L5 re-identified.
IMPRESSION: Posterior and interbody fusion depicted at L4-L5 and L5-S1.

## 2013-03-25 IMAGING — CR DG LUMBAR SPINE 2-3V
1 series · 1 of 1 positions shown · non-contrast
Comparison: 11/07/2010

CLINICAL DATA: For S1 PLIF

LUMBAR SPINE - 2-3 VIEW

[view not recorded]
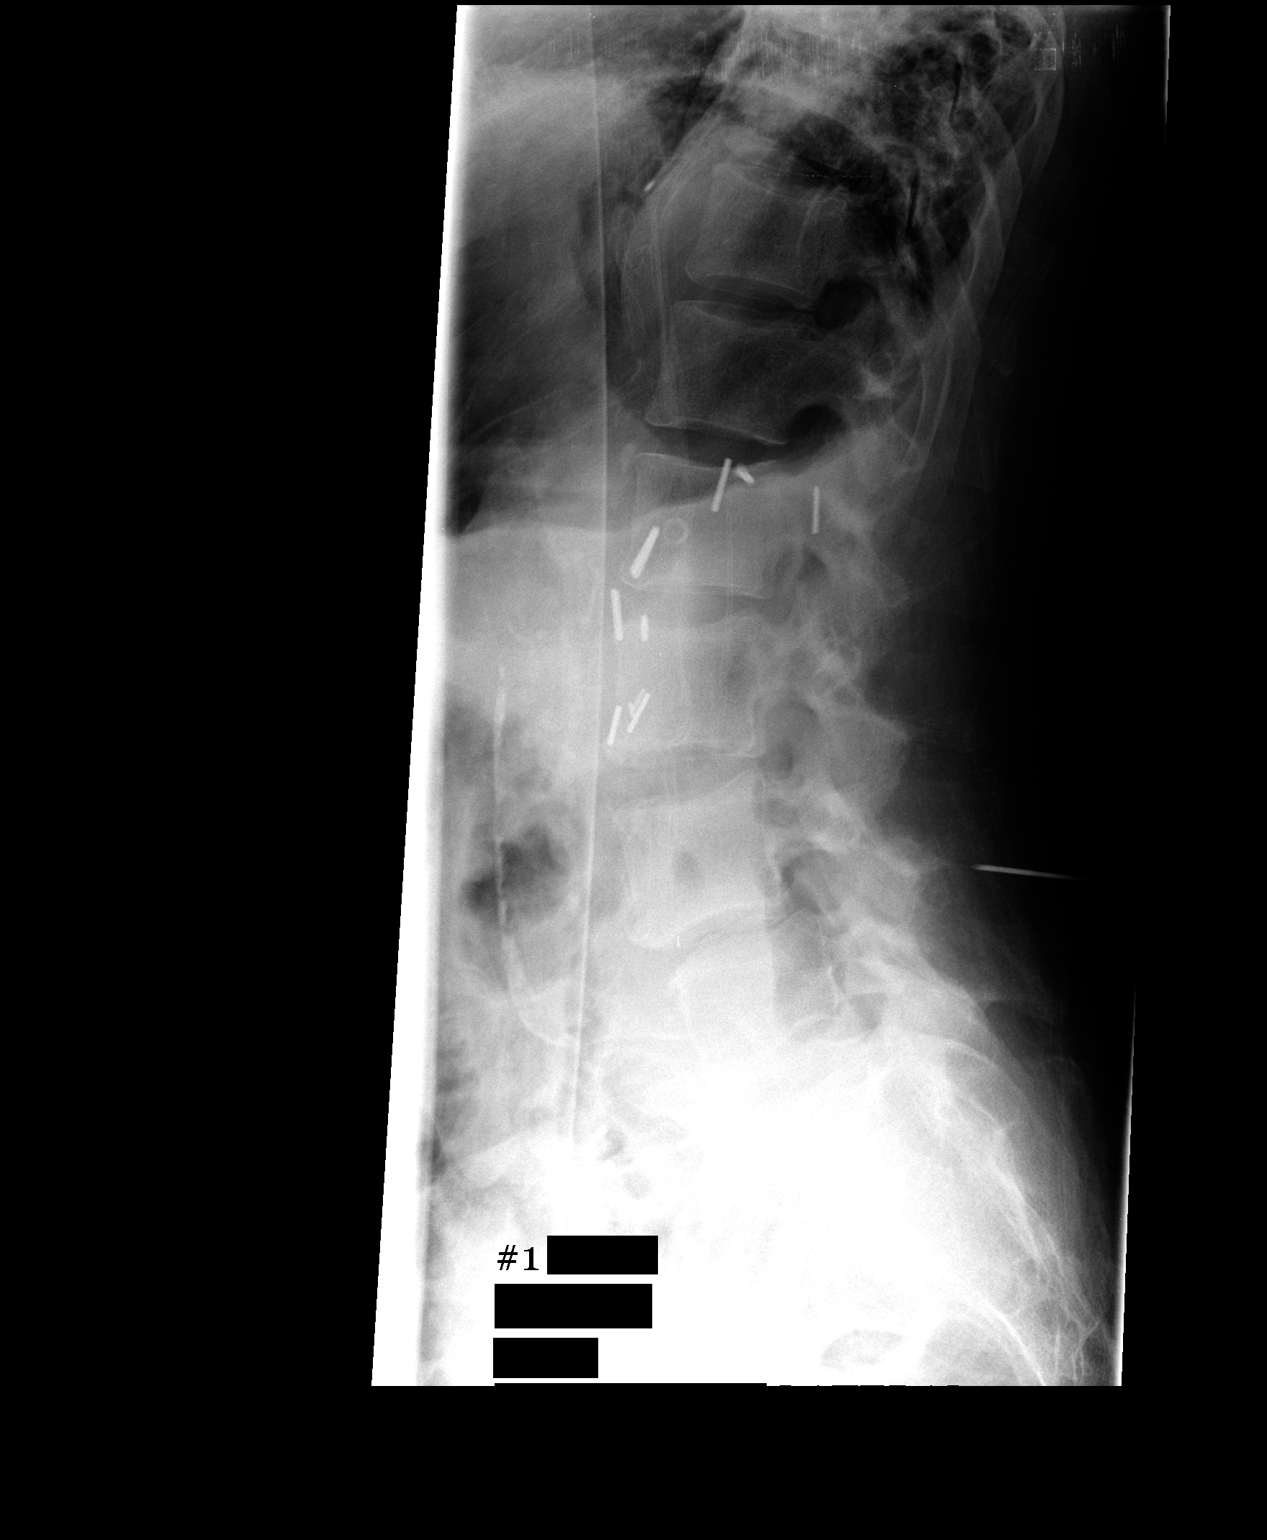

[1 of 1 positions shown; findings below may reference images not displayed]

FINDINGS: Initial film shows a needle at the level of the spinous
process of L4.  Second film shows tissue spreaders posteriorly with
probes at the L4 and L5 laminar levels.
IMPRESSION: L4 and L5 laminar levels localized.

## 2013-04-02 IMAGING — CR DG ABDOMEN 2V
2 series · 2 of 2 positions shown · non-contrast
Comparison: Plain films lumbar spine 10/26/2010 reviewed.

CLINICAL DATA: Abdominal distention and fever.  Elevated white
blood cell count.  Status post lumbar surgery 01/30/2011.

ABDOMEN - 2 VIEW

[w abdomen upright]
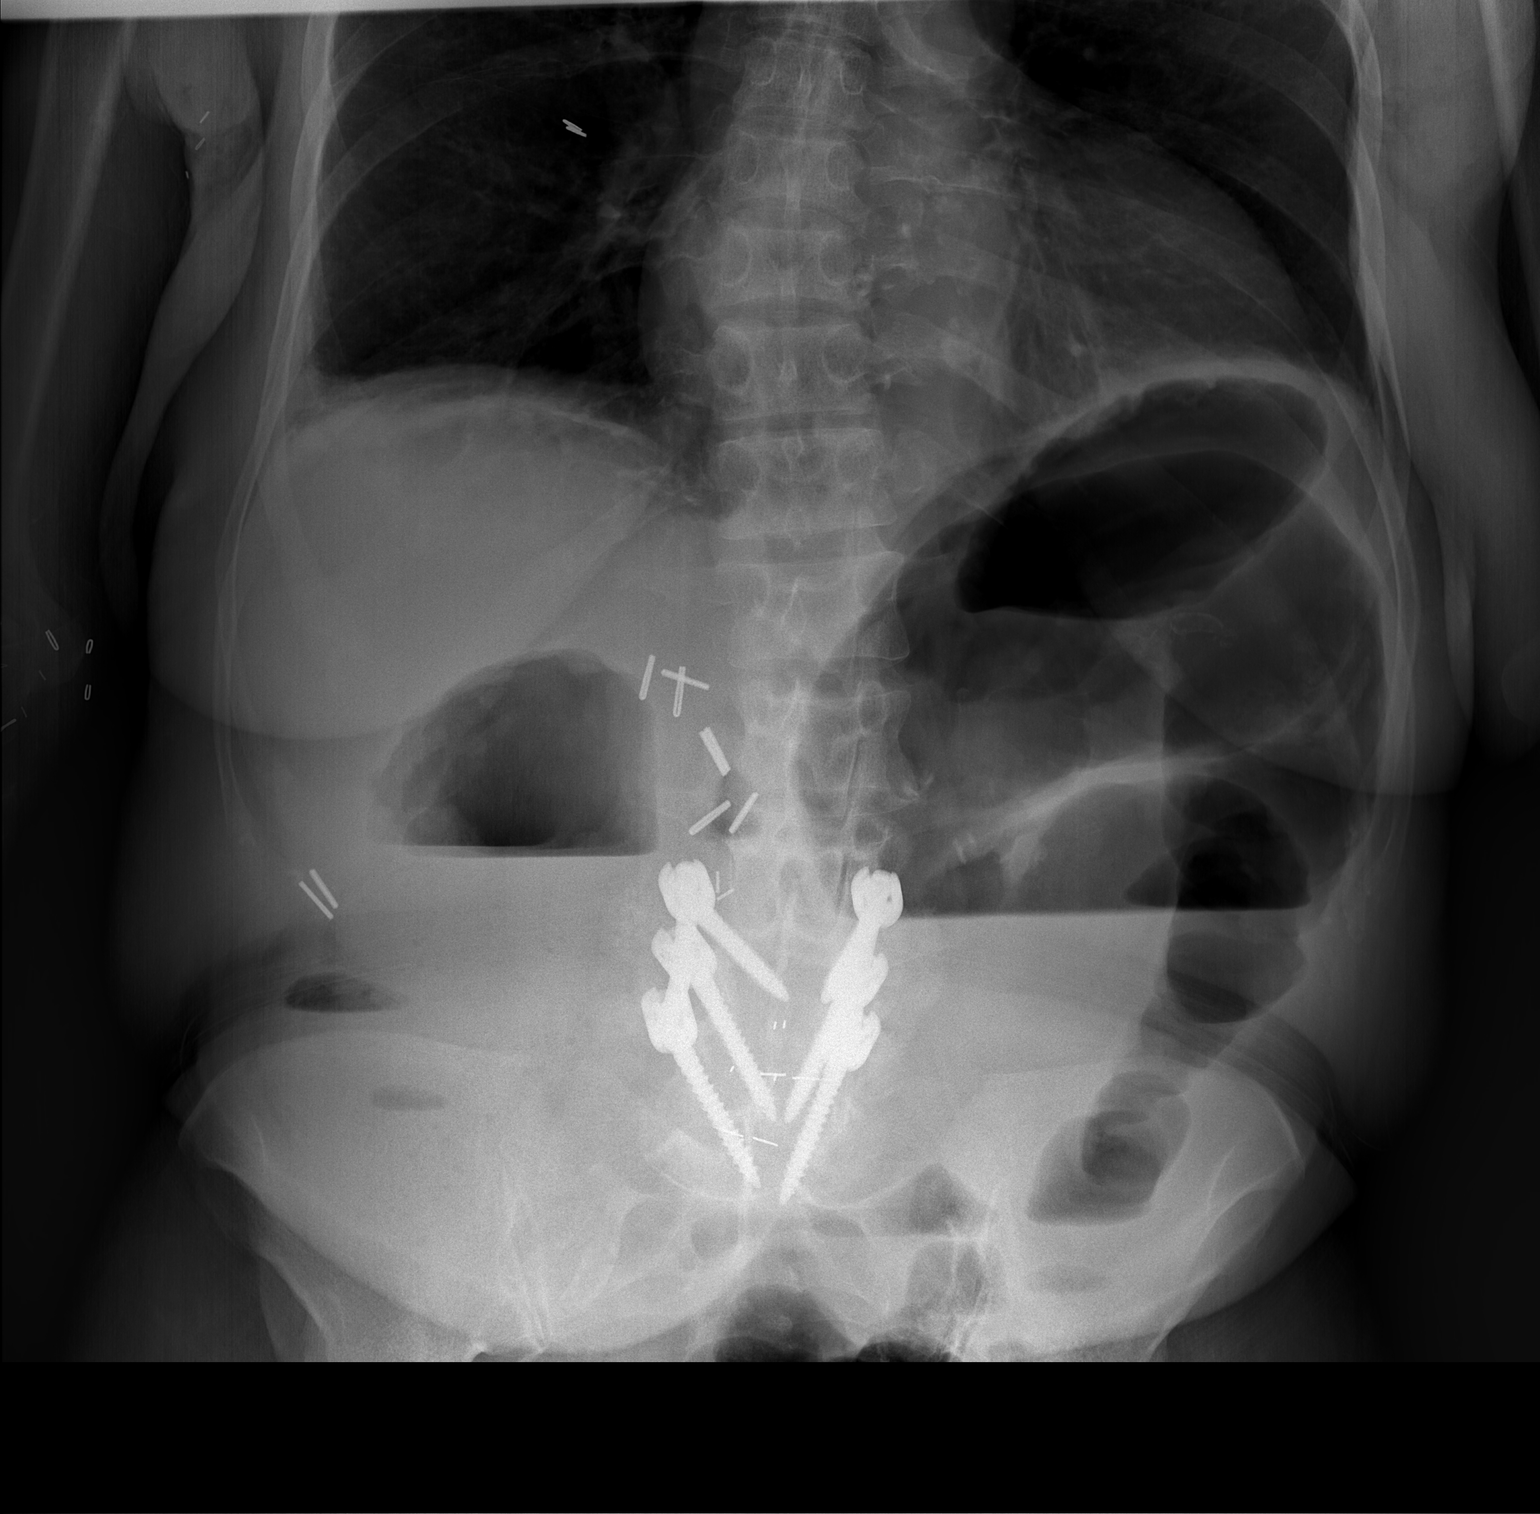

[t abdomen supine]
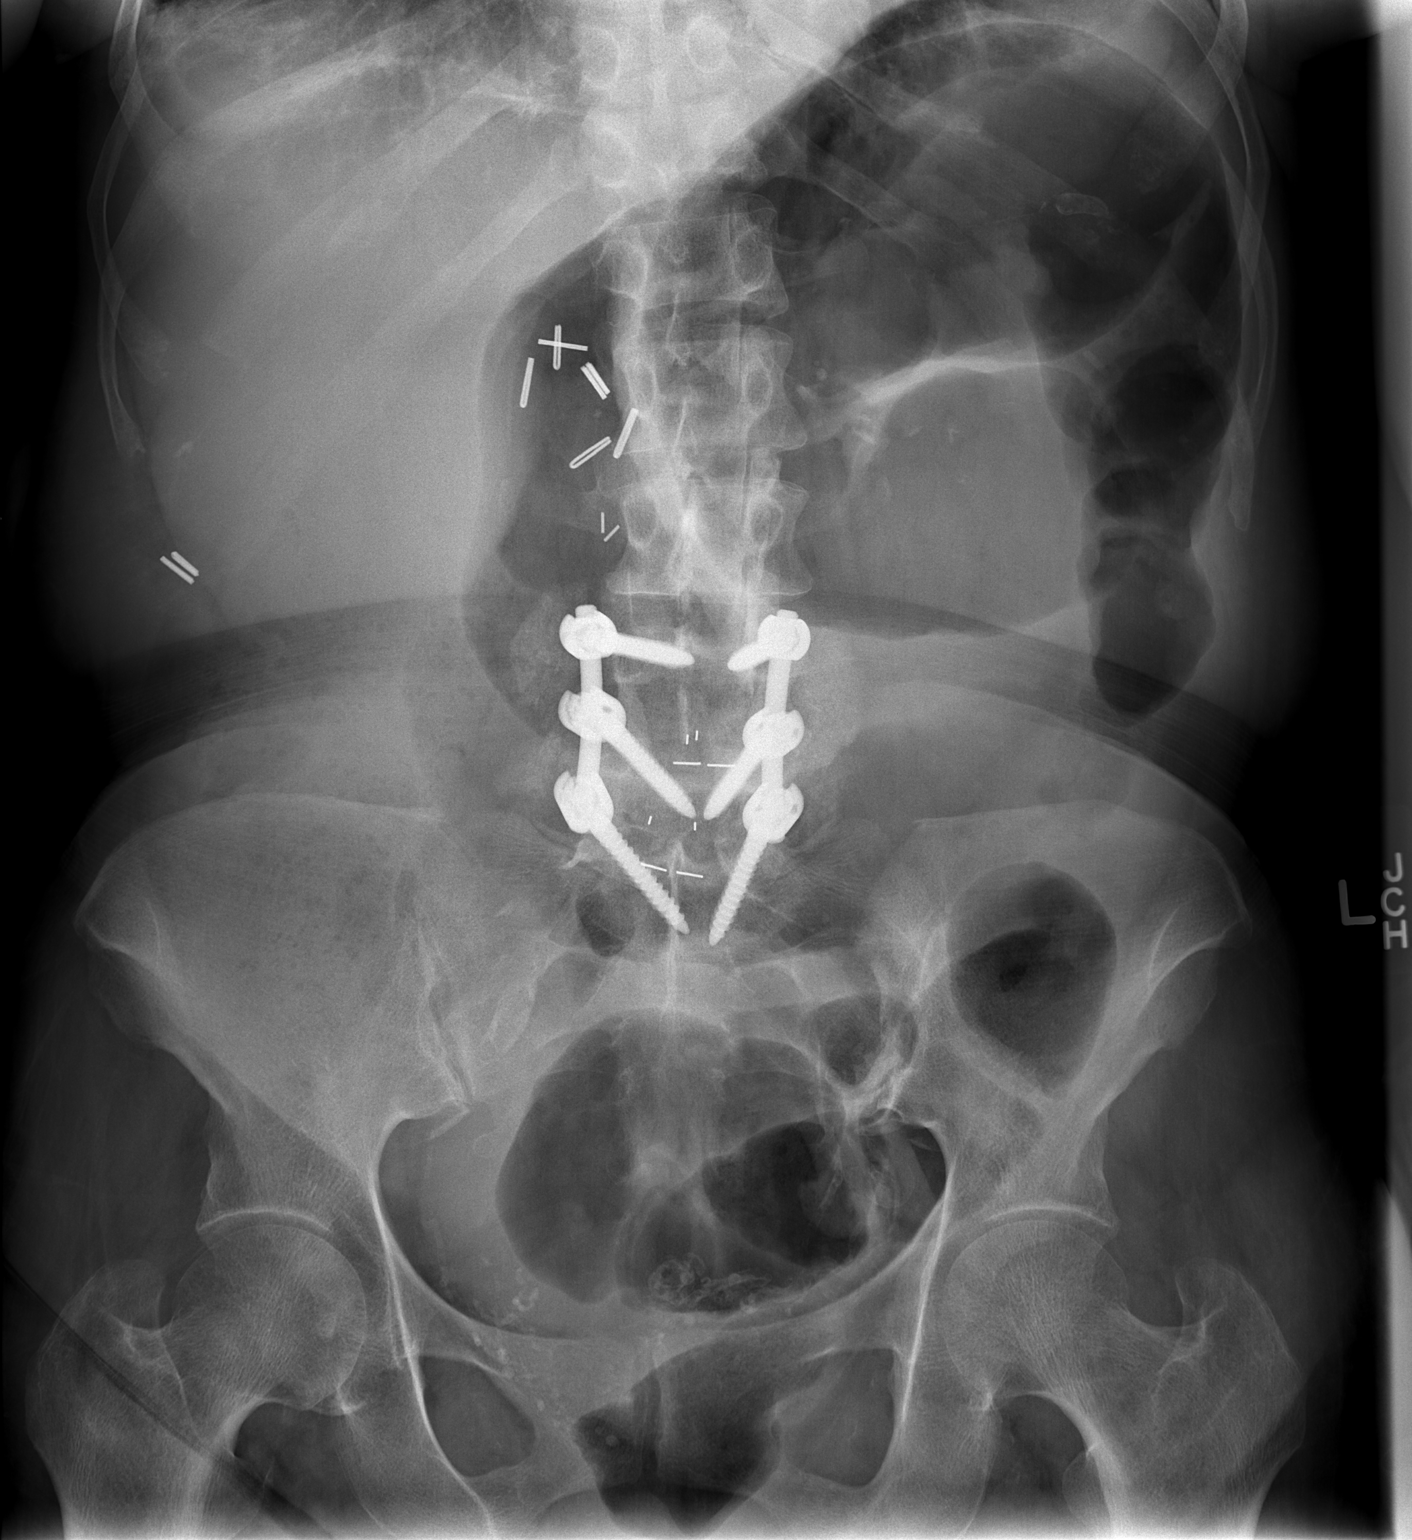

[2 of 2 positions shown; findings below may reference images not displayed]

FINDINGS: There is gaseous distention of large bowel with air-fluid
levels present.  No small bowel distention is identified.  Large
volume stool is present in the ascending colon.  Basilar
atelectasis is noted.  Postoperative change of lower lumbar fusion
is seen.
IMPRESSION: 1.  Distention of small bowel air-fluid levels most compatible with
colonic ileus.
2.  Bibasilar atelectasis.

## 2013-09-22 ENCOUNTER — Encounter (INDEPENDENT_AMBULATORY_CARE_PROVIDER_SITE_OTHER): Payer: Self-pay | Admitting: Surgery

## 2013-09-22 ENCOUNTER — Ambulatory Visit (INDEPENDENT_AMBULATORY_CARE_PROVIDER_SITE_OTHER): Payer: PRIVATE HEALTH INSURANCE | Admitting: Surgery

## 2013-09-22 ENCOUNTER — Other Ambulatory Visit (INDEPENDENT_AMBULATORY_CARE_PROVIDER_SITE_OTHER): Payer: Self-pay | Admitting: Surgery

## 2013-09-22 VITALS — BP 123/73 | HR 76 | Temp 98.1°F | Resp 18 | Ht 65.0 in | Wt 120.8 lb

## 2013-09-22 DIAGNOSIS — R109 Unspecified abdominal pain: Secondary | ICD-10-CM

## 2013-09-22 DIAGNOSIS — K439 Ventral hernia without obstruction or gangrene: Secondary | ICD-10-CM

## 2013-09-22 MED ORDER — HYDROCODONE-ACETAMINOPHEN 5-325 MG PO TABS: 1.0000 | ORAL_TABLET | Freq: Four times a day (QID) | ORAL | Status: DC | PRN

## 2013-09-22 NOTE — Progress Notes (Signed)
Subjective:     Patient ID: [name], female   DOB: [birth year], 63 y.o.   MRN: [id number]  HPI This is a pleasant female that I operated on in 2013. She is a dialysis patient. She developed acute ischemia of her right colon necessitating an emergent exploratory laparotomy and ileostomy. She then had her ileostomy reversed. She has been doing well until approximately 2 months ago she started having aching across her lower abdomen. She has no nausea or vomiting and her bowels are moving normally. She hurts almost daily.  The pain has been becoming increasingly severe.  Review of Systems     Objective:   Physical Exam On exam, she has firm scarring of her lower abdomen. She is tender along her incision but I cannot absolutely tell if there is an incisional hernia. Her abdominal wall is very fibrotic. She also has extreme curvature of spine from her previous spine surgery which is making the abdominal exam very difficult to perform    Assessment:     Abdominal pain of uncertain etiology.     Plan:     Because of the increasing severity of her abdominal pain and her previous history of bowel ischemia, she needs a CAT scan of the abdomen and pelvis with contrast. This will help me evaluate her abdominal wall as well to determine whether there is a developing hernia with GERD dense scarring. This would also allow evaluation of her previous ileocolic anastomosis.  I will see her back after the scan to determine whether or not she needs surgical intervention. I will also renew her hydrocodone.

## 2013-09-27 ENCOUNTER — Ambulatory Visit
Admission: RE | Admit: 2013-09-27 | Discharge: 2013-09-27 | Disposition: A | Discharge status: Home / Self Care | Payer: Medicare Other | Source: Ambulatory Visit | Attending: Surgery | Admitting: Surgery

## 2013-09-27 DIAGNOSIS — K439 Ventral hernia without obstruction or gangrene: Secondary | ICD-10-CM

## 2013-10-19 ENCOUNTER — Encounter (INDEPENDENT_AMBULATORY_CARE_PROVIDER_SITE_OTHER): Payer: PRIVATE HEALTH INSURANCE | Admitting: Surgery

## 2013-10-22 ENCOUNTER — Ambulatory Visit (INDEPENDENT_AMBULATORY_CARE_PROVIDER_SITE_OTHER): Payer: PRIVATE HEALTH INSURANCE | Admitting: Surgery

## 2013-10-22 ENCOUNTER — Encounter (INDEPENDENT_AMBULATORY_CARE_PROVIDER_SITE_OTHER): Payer: Self-pay | Admitting: Surgery

## 2013-10-22 VITALS — BP 100/64 | HR 72 | Temp 98.4°F | Resp 18 | Ht 65.0 in | Wt 121.4 lb

## 2013-10-22 DIAGNOSIS — K439 Ventral hernia without obstruction or gangrene: Secondary | ICD-10-CM

## 2013-10-22 NOTE — Progress Notes (Signed)
Subjective:     Patient ID: [name], female   DOB: [birth year], 63 y.o.   MRN: [id number]  HPI She is here for a followup to discuss the results of the CAT scan of the abdomen and pelvis for her low abdominal pain. She reports she has much less pain and less constipation.  Review of Systems     Objective:   Physical Exam On exam, she has a firm incision without evidence of hernia.  Her CAT scan shows no incisional hernia. There is a very tiny hernia in her epigastrium containing fat only. There is no evidence of inflammation in the intestines or of obstruction.    Assessment:     Ventral hernia and low about pain     Plan:     I suspect the hernia in the upper abdomen is not causing any discomfort and I am not recommending repair. Her low abdominal pain may be a combination of her incisional scar and/or constipation. I reassured her. If no surgery is planned. I will see her back as needed. She voiced understanding and happiness with the plan

## 2013-11-29 DIAGNOSIS — D689 Coagulation defect, unspecified: Secondary | ICD-10-CM | POA: Insufficient documentation

## 2013-12-02 DIAGNOSIS — R197 Diarrhea, unspecified: Secondary | ICD-10-CM | POA: Insufficient documentation

## 2013-12-02 DIAGNOSIS — R52 Pain, unspecified: Secondary | ICD-10-CM | POA: Insufficient documentation

## 2013-12-08 DIAGNOSIS — L299 Pruritus, unspecified: Secondary | ICD-10-CM | POA: Insufficient documentation

## 2013-12-08 DIAGNOSIS — R509 Fever, unspecified: Secondary | ICD-10-CM | POA: Insufficient documentation

## 2013-12-08 DIAGNOSIS — R519 Headache, unspecified: Secondary | ICD-10-CM | POA: Insufficient documentation

## 2015-05-06 DIAGNOSIS — Z283 Underimmunization status: Secondary | ICD-10-CM | POA: Insufficient documentation

## 2015-05-06 DIAGNOSIS — Z2839 Other underimmunization status: Secondary | ICD-10-CM | POA: Insufficient documentation

## 2015-05-19 DIAGNOSIS — Z9181 History of falling: Secondary | ICD-10-CM | POA: Insufficient documentation

## 2015-05-19 DIAGNOSIS — Z8679 Personal history of other diseases of the circulatory system: Secondary | ICD-10-CM | POA: Insufficient documentation

## 2015-05-19 DIAGNOSIS — M21371 Foot drop, right foot: Secondary | ICD-10-CM | POA: Insufficient documentation

## 2015-05-19 DIAGNOSIS — F17200 Nicotine dependence, unspecified, uncomplicated: Secondary | ICD-10-CM | POA: Insufficient documentation

## 2015-12-19 ENCOUNTER — Encounter: Payer: Self-pay | Admitting: Vascular Surgery

## 2015-12-22 ENCOUNTER — Ambulatory Visit: Payer: Medicare Other | Admitting: Vascular Surgery

## 2016-02-19 ENCOUNTER — Encounter: Payer: Self-pay | Admitting: Vascular Surgery

## 2016-02-23 ENCOUNTER — Ambulatory Visit: Payer: Medicare Other | Admitting: Vascular Surgery

## 2016-04-09 ENCOUNTER — Encounter: Payer: Self-pay | Admitting: Vascular Surgery

## 2016-04-12 ENCOUNTER — Encounter (HOSPITAL_COMMUNITY): Payer: Self-pay | Admitting: *Deleted

## 2016-04-12 ENCOUNTER — Encounter: Payer: Self-pay | Admitting: Vascular Surgery

## 2016-04-12 ENCOUNTER — Other Ambulatory Visit: Payer: Self-pay

## 2016-04-12 ENCOUNTER — Ambulatory Visit (INDEPENDENT_AMBULATORY_CARE_PROVIDER_SITE_OTHER): Payer: Medicare Other | Admitting: Vascular Surgery

## 2016-04-12 VITALS — BP 96/68 | HR 102 | Temp 97.3°F | Resp 18 | Ht 65.0 in | Wt 115.9 lb

## 2016-04-12 DIAGNOSIS — Z992 Dependence on renal dialysis: Secondary | ICD-10-CM | POA: Diagnosis not present

## 2016-04-12 DIAGNOSIS — T82898A Other specified complication of vascular prosthetic devices, implants and grafts, initial encounter: Secondary | ICD-10-CM

## 2016-04-12 DIAGNOSIS — N186 End stage renal disease: Secondary | ICD-10-CM

## 2016-04-12 DIAGNOSIS — T82511A Breakdown (mechanical) of surgically created arteriovenous shunt, initial encounter: Secondary | ICD-10-CM

## 2016-04-12 NOTE — Progress Notes (Signed)
Pt denies SOB, chest pain, and being under the care of a cardiologist. Pt denies having a cardiac cath. Pt denies having a chest x ray and EKG within the last year. Pt made aware to stop taking otc vitamins, fish oil and herbal medications. Do not take any NSAIDs ie: Ibuprofen, Advil, Naproxen , ( Aleve) BC and Goody Powder. Pt verbalized understanding of all pre-op instructions.

## 2016-04-12 NOTE — Progress Notes (Signed)
Referred by:  Dr. Madelon Lips (Nephrology)  Reason for referral: New access  History of Present Illness  [name] is a 66 y.o. (February 08, 1951) female who presents for evaluation of right upper arm arteriovenous graft.  The patient is left hand dominant.  The patient has previously had R FA AVG, R UA AVG and redo RUA AVG.  Previous central venous cannulation procedures include: RIJV TDC.  The patient has never had a PPM placed.  Pt denies any bleeding complications but notes her dialysis center is concerned with the skin overlying two over her cannulation sites.  She denies any steal from the RUA AVG  Past Medical History:  Diagnosis Date  . Anemia    anemia of chronic disease likely 2/2 ESRD per last anemia panel (02/2011) with Fe 35, TIBC 167, ferritin 2041  // BL Hgb 8-10  . Anuria    Due to dialysis  . Aortic stenosis, mild    03/2011 echo  . Arthritis    Back  . Blood transfusion 1990's   r/t Kidney removal surgery  . Brain aneurysm    No records could be found  . Colitis, ischemic ([place]) 01/2011   S/P partial colectomy of right hemicolon and ileostomy placement  . Diabetes mellitus    Borderline  . Dialysis patient [facility])    [place] Tues,Thursday, saturday  . Dysrhythmia    SVT for brief period in Feb 2013  . End stage renal disease on dialysis [facility])    Secondary to hypertension // T/Th/Sat dialysis on [employer]  . Foot drop, bilateral    after back surgery  . Heart murmur    Born with heart mumur, does not require follow up per pt  . Hepatitis C   . Hypertension    Does not see a heart doctor, had pre transplant stress test at [facility]    . Lumbar spinal stenosis     bilateral L4-5 and L5-S1 posterior lumbar interbody arthrodesis and bilateral L4-S1 posterior lateral arthrodesis for lumbar stenosis dynamic lumbar spondylolisthesis lumbar spondylosis lumbar radiculopathy with foot drop // s/p surgery 01/2011 - see surgery section for details.  .  Seizures ([place])    r/t HTN in 1990's x 1  . Umbilical hernia age 69    Past Surgical History:  Procedure Laterality Date  . APPENDECTOMY  1960's  . AV FISTULA PLACEMENT  04/08/2011   Procedure: INSERTION OF ARTERIOVENOUS (AV) GORE-TEX GRAFT ARM;  Surgeon: Rosetta Posner, MD;  Location: [facility] OR;  Service: Vascular;  Laterality: Right;  Revision of Upper Arm Gore-Tex Graft  . BACK SURGERY  01/30/2011   L4-S1 lumbar laminectomy facetectomy and foraminotomies for decompression a bilateral L4-5 and L5-S1 posterior lumbar interbody arthrodesis and bilateral L4-S1 posterior lateral arthrodesis for lumbar stenosis dynamic lumbar spondylolisthesis lumbar spondylosis lumbar radiculopathy with foot drop  . COLONOSCOPY  02/19/2011   Procedure: COLONOSCOPY;  Surgeon: Missy Sabins, MD;  Location: [place];  Service: Endoscopy;  Laterality: N/A;  . COLONOSCOPY  03/07/2011   Procedure: COLONOSCOPY;  Surgeon: Cleotis Nipper, MD;  Location: [facility] ENDOSCOPY;  Service: Endoscopy;  Laterality: N/A;  no prep needed--pt has long Hartmann's pouch and ileostomy  . COLOSTOMY  02/09/2011   Reason for surgery: ischemic colitis of right hemicolon requiring partial colectomy and ileostomy.  Surgeon: Harl Bowie, MD;  Location: [place];  Service: General;  Laterality: Right;  . ILEOSTOMY CLOSURE  05/29/2011   Procedure: ILEOSTOMY TAKEDOWN;  Surgeon: Marco Collie  Ninfa Linden, MD;  Location: [place];  Service: General;  Laterality: N/A;  . LAPAROTOMY  02/09/2011   Procedure: EXPLORATORY LAPAROTOMY;  Surgeon: Harl Bowie, MD;  Location: [place];  Service: General;  Laterality: N/A;  . NEPHRECTOMY  2010   right side done at [facility] on transplant list  . PARTIAL COLECTOMY  02/09/2011   Reason for surgery: ischemic colitis of right hemicolon; Surgeon: Harl Bowie, MD;  Location: [place];  Service: General;  Laterality: Right;  . RIB RESECTION     d/t kidney removal  . TONSILLECTOMY     as a child  . total  parathyroidectomy  01/17/2001   with autotransplantation into left forearm // due to secondary hyperparathyroidism from ESRD  . UMBILICAL HERNIA REPAIR  age 68- 58  . VASCULAR SURGERY     right arm dialysis graft    Social History   Social History  . Marital status: Legally Separated    Spouse name: N/A  . Number of children: 2  . Years of education: 10th grade   Occupational History  . Retired     previously worked as a Producer, television/film/video History Main Topics  . Smoking status: Current Some Day Smoker    Packs/day: 0.50    Years: 48.00    Types: Cigarettes    Last attempt to quit: 08/17/2010  . Smokeless tobacco: Never Used  . Alcohol use No     Comment: 1 pint on Fridays  . Drug use: No  . Sexual activity: No   Other Topics Concern  . Not on file   Social History Narrative   Lives in [place] with daughter. Has 2 On HD since for 20 years. Worked as a Sports coach. Went to 10th grade. Can read and write.     Family History  Problem Relation Age of Onset  . Diabetes Father   . Stroke Mother   . COPD Brother   . Heart attack Brother   . Anesthesia problems Neg Hx   . Hypotension Neg Hx   . Malignant hyperthermia Neg Hx   . Pseudochol deficiency Neg Hx     Current Outpatient Prescriptions  Medication Sig Dispense Refill  . B Complex-C-Zn-Folic Acid (DIALYVITE 470 WITH ZINC) 0.8 MG TABS Take 1 tablet by mouth daily.    . calcium carbonate (OS-CAL) 1250 MG chewable tablet Chew 1 tablet by mouth daily.    . darbepoetin (ARANESP) 200 MCG/0.4ML SOLN Inject 200 mcg into the skin every 7 (seven) days. Administered at dialysis per nephrology; taken every Thursday.    . Multiple Vitamin (MULTIVITAMIN WITH MINERALS) TABS Take 1 tablet by mouth daily.    Marland Kitchen gabapentin (NEURONTIN) 100 MG capsule Take 100 mg by mouth daily.    Marland Kitchen HYDROcodone-acetaminophen (NORCO) 5-325 MG per tablet Take 1-2 tablets by mouth every 6 (six) hours as needed. (Patient not taking: Reported on 04/12/2016) 30  tablet 0  . HYDROcodone-acetaminophen (NORCO/VICODIN) 5-325 MG per tablet Take 1 tablet by mouth every 6 (six) hours as needed. (Patient not taking: Reported on 04/12/2016) 30 tablet 0  . loperamide (IMODIUM) 2 MG capsule Take 2 mg by mouth every 3 (three) hours as needed.     No current facility-administered medications for this visit.     Allergies  Allergen Reactions  . Contrast Media [Iodinated Diagnostic Agents]     Seziure  . Iohexol Other (See Comments)    Reaction is convulsions    REVIEW OF SYSTEMS:   Cardiac:  positive for: no symptoms, negative for: Chest pain or chest pressure, Shortness of breath upon exertion and Shortness of breath when lying flat,   Vascular:  positive for: no symptoms,  negative for: Pain in calf, thigh, or hip brought on by ambulation, Pain in feet at night that wakes you up from your sleep, Blood clot in your veins and Leg swelling  Pulmonary:  positive for: no symptoms,  negative for: Oxygen at home, Productive cough and Wheezing  Neurologic:  positive for: No symptoms, negative for: Sudden weakness in arms or legs, Sudden numbness in arms or legs, Sudden onset of difficulty speaking or slurred speech, Temporary loss of vision in one eye and Problems with dizziness  Gastrointestinal:  positive for: no symptoms, negative for: Blood in stool and Vomited blood  Genitourinary:  positive for: end stage renal disease-HD: T/R/S negative for: Burning when urinating and Blood in urine  Psychiatric:  positive for: no symptoms,  negative for: Major depression  Hematologic:  positive for: no symptoms,  negative for: negative for: Bleeding problems and Problems with blood clotting too easily  Dermatologic:  positive for: no symptoms, negative for: Rashes or ulcers  Constitutional:  positive for: no symptoms, negative for: Fever or chills  Ear/Nose/Throat:  positive for: no symptoms, negative for: Change in hearing, Nose bleeds and Sore  throat  Musculoskeletal:  positive for: no symptoms, negative for: Back pain, Joint pain and Muscle pain   Physical Examination  Vitals:   04/12/16 1314  BP: 96/68  Pulse: (!) 102  Resp: 18  Temp: 97.3 F (36.3 C)  TempSrc: Oral  SpO2: 98%  Weight: 115 lb 14.4 oz (52.6 kg)  Height: 5\' 5"  (1.651 m)    Body mass index is 19.29 kg/m.  General: Alert, O x 3, WD,NAD  Head: [place]/AT,   Ear/Nose/Throat: Hearing grossly intact, nares without erythema or drainage, oropharynx without Erythema or Exudate , Mallampati score: 3, Dentition intact  Eyes: PERRLA, EOMI,   Neck: Supple, mid-line trachea,    Pulmonary: Sym exp, good B air movt,CTA B  Cardiac: RRR, Nl S1, S2, no Murmurs, No rubs, No S3,S4  Vascular: Vessel Right Left  Radial Palpable Palpable  Brachial Palpable Palpable  Carotid Palpable, No Bruit Palpable, No Bruit  Aorta Not palpable N/A  Femoral Palpable Palpable  Popliteal Not palpable Not palpable  PT Palpable Not palpable  DP Not palpable Palpable   Gastrointestinal: soft, non-distended, non-tender to palpation, No guarding or rebound, no HSM, no masses, no CVAT B, No palpable prominent aortic pulse,    Musculoskeletal: M/S 5/5 throughout  , Extremities without ischemic changes  , No edema present, B calf external braces, two moderate to large size PSA in RUA AVG, skin is attenuated in both (distal>proximal), thrombosed RUA AVG medial to active AVG  Neurologic: CN 2-12 intact, Pain and light touch intact in extremities, Motor exam as listed above  Psychiatric: Judgement intact, Mood & affect appropriate for pt's clinical situation  Dermatologic: See M/S exam for extremity exam, No rashes otherwise noted  Lymph : Palpable lymph nodes: None   Outside Studies/Documentation 10 pages of outside documents were reviewed including: outside nephrology chart.   Medical Decision Making  ALEIGHYA MCANELLY is a 66 y.o. female who presents with ESRD requiring  hemodialysis, RUA with two PSA concerning for imminent rupture   I recommended to the patient: excision of the segment of AVG that is frankly ruptured x 2 and replacing with interposition graft.  This will require  placement of TDC while the wound heals. Risk, benefits, and alternatives to access surgery were discussed.   The patient is aware the risks include but are not limited to: bleeding, infection, steal syndrome, nerve damage, ischemic monomelic neuropathy, thrombosis, failure to mature, need for additional procedures, death and stroke.   The patient is aware the risks of tunneled dialysis catheter placement include but are not limited to: bleeding, infection, central venous injury, pneumothorax, possible venous stenosis, possible malpositioning in the venous system, and possible infections related to long-term catheter presence.   The patient has agreed to proceed with the above procedure which will be scheduled this coming Monday 29 JAN 18.   Adele Barthel, MD [facility] Office: [phone] Pager: [phone]  04/12/2016, 1:33 PM

## 2016-04-15 ENCOUNTER — Ambulatory Visit (HOSPITAL_COMMUNITY): Payer: Medicare Other | Admitting: Certified Registered Nurse Anesthetist

## 2016-04-15 ENCOUNTER — Encounter (HOSPITAL_COMMUNITY): Payer: Self-pay | Admitting: Certified Registered Nurse Anesthetist

## 2016-04-15 ENCOUNTER — Ambulatory Visit (HOSPITAL_COMMUNITY)
Admission: RE | Admit: 2016-04-15 | Discharge: 2016-04-15 | Disposition: A | Discharge status: Home / Self Care | Payer: Medicare Other | Source: Ambulatory Visit | Attending: Vascular Surgery | Admitting: Vascular Surgery

## 2016-04-15 ENCOUNTER — Encounter (HOSPITAL_COMMUNITY)
Admission: RE | Disposition: A | Discharge status: Home / Self Care | Payer: Self-pay | Source: Ambulatory Visit | Attending: Vascular Surgery

## 2016-04-15 ENCOUNTER — Ambulatory Visit (HOSPITAL_COMMUNITY): Payer: Medicare Other

## 2016-04-15 DIAGNOSIS — Z79899 Other long term (current) drug therapy: Secondary | ICD-10-CM | POA: Insufficient documentation

## 2016-04-15 DIAGNOSIS — Z905 Acquired absence of kidney: Secondary | ICD-10-CM | POA: Insufficient documentation

## 2016-04-15 DIAGNOSIS — I120 Hypertensive chronic kidney disease with stage 5 chronic kidney disease or end stage renal disease: Secondary | ICD-10-CM | POA: Insufficient documentation

## 2016-04-15 DIAGNOSIS — Z9049 Acquired absence of other specified parts of digestive tract: Secondary | ICD-10-CM | POA: Diagnosis not present

## 2016-04-15 DIAGNOSIS — Z933 Colostomy status: Secondary | ICD-10-CM | POA: Diagnosis not present

## 2016-04-15 DIAGNOSIS — T82898A Other specified complication of vascular prosthetic devices, implants and grafts, initial encounter: Secondary | ICD-10-CM | POA: Insufficient documentation

## 2016-04-15 DIAGNOSIS — N186 End stage renal disease: Secondary | ICD-10-CM | POA: Insufficient documentation

## 2016-04-15 DIAGNOSIS — F17210 Nicotine dependence, cigarettes, uncomplicated: Secondary | ICD-10-CM | POA: Insufficient documentation

## 2016-04-15 DIAGNOSIS — Z992 Dependence on renal dialysis: Secondary | ICD-10-CM | POA: Insufficient documentation

## 2016-04-15 DIAGNOSIS — E1122 Type 2 diabetes mellitus with diabetic chronic kidney disease: Secondary | ICD-10-CM | POA: Diagnosis not present

## 2016-04-15 DIAGNOSIS — T829XXA Unspecified complication of cardiac and vascular prosthetic device, implant and graft, initial encounter: Secondary | ICD-10-CM | POA: Insufficient documentation

## 2016-04-15 DIAGNOSIS — Z419 Encounter for procedure for purposes other than remedying health state, unspecified: Secondary | ICD-10-CM

## 2016-04-15 DIAGNOSIS — Z95828 Presence of other vascular implants and grafts: Secondary | ICD-10-CM

## 2016-04-15 HISTORY — PX: REVISION OF ARTERIOVENOUS GORETEX GRAFT: SHX6073

## 2016-04-15 HISTORY — PX: INSERTION OF DIALYSIS CATHETER: SHX1324

## 2016-04-15 LAB — GLUCOSE, CAPILLARY: GLUCOSE-CAPILLARY: 76 mg/dL (ref 65–99)

## 2016-04-15 LAB — POCT I-STAT 4, (NA,K, GLUC, HGB,HCT)
GLUCOSE: 76 mg/dL (ref 65–99)
HEMATOCRIT: 35 % — AB (ref 36.0–46.0)
HEMOGLOBIN: 11.9 g/dL — AB (ref 12.0–15.0)
Potassium: 4.5 mmol/L (ref 3.5–5.1)
SODIUM: 138 mmol/L (ref 135–145)

## 2016-04-15 SURGERY — INSERTION OF DIALYSIS CATHETER
Anesthesia: General | Site: Neck | Laterality: Right

## 2016-04-15 MED ORDER — PHENYLEPHRINE HCL 10 MG/ML IJ SOLN
INTRAMUSCULAR | Status: DC | PRN
  Administered 2016-04-15: 160 ug via INTRAVENOUS
  Administered 2016-04-15 (×2): 120 ug via INTRAVENOUS

## 2016-04-15 MED ORDER — ONDANSETRON HCL 4 MG/2ML IJ SOLN
INTRAMUSCULAR | Status: AC
  Filled 2016-04-15: qty 2

## 2016-04-15 MED ORDER — PHENYLEPHRINE 40 MCG/ML (10ML) SYRINGE FOR IV PUSH (FOR BLOOD PRESSURE SUPPORT)
PREFILLED_SYRINGE | INTRAVENOUS | Status: AC
  Filled 2016-04-15: qty 10

## 2016-04-15 MED ORDER — OXYCODONE-ACETAMINOPHEN 5-325 MG PO TABS: 1.0000 | ORAL_TABLET | Freq: Four times a day (QID) | ORAL | 0 refills | Status: DC | PRN

## 2016-04-15 MED ORDER — LIDOCAINE 2% (20 MG/ML) 5 ML SYRINGE
INTRAMUSCULAR | Status: AC
  Filled 2016-04-15: qty 5

## 2016-04-15 MED ORDER — DEXTROSE 5 % IV SOLN
1.5000 g | INTRAVENOUS | Status: AC
  Administered 2016-04-15: 1.5 g via INTRAVENOUS

## 2016-04-15 MED ORDER — HEPARIN SODIUM (PORCINE) 1000 UNIT/ML IJ SOLN
INTRAMUSCULAR | Status: DC | PRN
Start: 2016-04-15 — End: 2016-04-15
  Administered 2016-04-15: 3.4 mL

## 2016-04-15 MED ORDER — PROPOFOL 10 MG/ML IV BOLUS
INTRAVENOUS | Status: DC | PRN
  Administered 2016-04-15: 150 mg via INTRAVENOUS
  Administered 2016-04-15: 20 mg via INTRAVENOUS

## 2016-04-15 MED ORDER — MEPERIDINE HCL 25 MG/ML IJ SOLN: 6.2500 mg | INTRAMUSCULAR | Status: DC | PRN

## 2016-04-15 MED ORDER — SODIUM CHLORIDE 0.9 % IV SOLN
INTRAVENOUS | Status: DC
  Administered 2016-04-15 (×3): via INTRAVENOUS

## 2016-04-15 MED ORDER — SODIUM CHLORIDE 0.9 % IV SOLN
INTRAVENOUS | Status: DC | PRN
  Administered 2016-04-15: 500 mL

## 2016-04-15 MED ORDER — IOPAMIDOL (ISOVUE-300) INJECTION 61%
INTRAVENOUS | Status: AC
  Filled 2016-04-15: qty 50

## 2016-04-15 MED ORDER — HYDROMORPHONE HCL 1 MG/ML IJ SOLN
0.2500 mg | INTRAMUSCULAR | Status: DC | PRN
  Administered 2016-04-15: 0.5 mg via INTRAVENOUS

## 2016-04-15 MED ORDER — PROTAMINE SULFATE 10 MG/ML IV SOLN
INTRAVENOUS | Status: DC | PRN
  Administered 2016-04-15: 30 mg via INTRAVENOUS

## 2016-04-15 MED ORDER — ONDANSETRON HCL 4 MG/2ML IJ SOLN: 4.0000 mg | Freq: Once | INTRAMUSCULAR | Status: DC | PRN

## 2016-04-15 MED ORDER — FENTANYL CITRATE (PF) 100 MCG/2ML IJ SOLN
INTRAMUSCULAR | Status: DC | PRN
  Administered 2016-04-15 (×3): 50 ug via INTRAVENOUS

## 2016-04-15 MED ORDER — DEXTROSE 5 % IV SOLN
INTRAVENOUS | Status: AC
  Filled 2016-04-15: qty 1.5

## 2016-04-15 MED ORDER — CHLORHEXIDINE GLUCONATE CLOTH 2 % EX PADS: 6.0000 | MEDICATED_PAD | Freq: Once | CUTANEOUS | Status: DC

## 2016-04-15 MED ORDER — FENTANYL CITRATE (PF) 100 MCG/2ML IJ SOLN
INTRAMUSCULAR | Status: AC
  Filled 2016-04-15: qty 2

## 2016-04-15 MED ORDER — HYDROMORPHONE HCL 1 MG/ML IJ SOLN
INTRAMUSCULAR | Status: AC
  Filled 2016-04-15: qty 0.5

## 2016-04-15 MED ORDER — HEPARIN SODIUM (PORCINE) 1000 UNIT/ML IJ SOLN
INTRAMUSCULAR | Status: AC
  Filled 2016-04-15: qty 1

## 2016-04-15 MED ORDER — 0.9 % SODIUM CHLORIDE (POUR BTL) OPTIME
TOPICAL | Status: DC | PRN
  Administered 2016-04-15: 1000 mL

## 2016-04-15 MED ORDER — LIDOCAINE-EPINEPHRINE (PF) 1 %-1:200000 IJ SOLN
INTRAMUSCULAR | Status: AC
  Filled 2016-04-15: qty 30

## 2016-04-15 MED ORDER — PROTAMINE SULFATE 10 MG/ML IV SOLN
INTRAVENOUS | Status: AC
  Filled 2016-04-15: qty 5

## 2016-04-15 MED ORDER — HEMOSTATIC AGENTS (NO CHARGE) OPTIME
TOPICAL | Status: DC | PRN
  Administered 2016-04-15: 1 via TOPICAL

## 2016-04-15 MED ORDER — PHENYLEPHRINE HCL 10 MG/ML IJ SOLN
INTRAVENOUS | Status: DC | PRN
  Administered 2016-04-15: 25 ug/min via INTRAVENOUS

## 2016-04-15 MED ORDER — LIDOCAINE HCL (PF) 1 % IJ SOLN
INTRAMUSCULAR | Status: AC
  Filled 2016-04-15: qty 30

## 2016-04-15 MED ORDER — LIDOCAINE 2% (20 MG/ML) 5 ML SYRINGE
INTRAMUSCULAR | Status: DC | PRN
  Administered 2016-04-15: 100 mg via INTRAVENOUS

## 2016-04-15 MED ORDER — HEPARIN SODIUM (PORCINE) 1000 UNIT/ML IJ SOLN
INTRAMUSCULAR | Status: DC | PRN
  Administered 2016-04-15: 6000 [IU] via INTRAVENOUS

## 2016-04-15 MED ORDER — ONDANSETRON HCL 4 MG/2ML IJ SOLN
INTRAMUSCULAR | Status: DC | PRN
  Administered 2016-04-15: 4 mg via INTRAVENOUS

## 2016-04-15 MED ORDER — PROPOFOL 10 MG/ML IV BOLUS
INTRAVENOUS | Status: AC
  Filled 2016-04-15: qty 20

## 2016-04-15 MED ORDER — HYDROMORPHONE HCL 1 MG/ML IJ SOLN: 0.2500 mg | INTRAMUSCULAR | Status: DC | PRN

## 2016-04-15 SURGICAL SUPPLY — 74 items
ADH SKN CLS APL DERMABOND .7 (GAUZE/BANDAGES/DRESSINGS) ×2
AGENT HMST SPONGE THK3/8 (HEMOSTASIS) ×2
BAG DECANTER FOR FLEXI CONT (MISCELLANEOUS) ×4 IMPLANT
BIOPATCH RED 1 DISK 7.0 (GAUZE/BANDAGES/DRESSINGS) ×3 IMPLANT
BIOPATCH RED 1IN DISK 7.0MM (GAUZE/BANDAGES/DRESSINGS) ×1
CANISTER SUCTION 2500CC (MISCELLANEOUS) ×4 IMPLANT
CATH PALINDROME RT-P 15FX19CM (CATHETERS) IMPLANT
CATH PALINDROME RT-P 15FX23CM (CATHETERS) ×2 IMPLANT
CATH PALINDROME RT-P 15FX28CM (CATHETERS) IMPLANT
CATH PALINDROME RT-P 15FX55CM (CATHETERS) IMPLANT
CATH STRAIGHT 5FR 65CM (CATHETERS) IMPLANT
CLIP TI MEDIUM 6 (CLIP) ×4 IMPLANT
CLIP TI WIDE RED SMALL 6 (CLIP) ×4 IMPLANT
COVER PROBE W GEL 5X96 (DRAPES) ×4 IMPLANT
COVER SURGICAL LIGHT HANDLE (MISCELLANEOUS) ×4 IMPLANT
DECANTER SPIKE VIAL GLASS SM (MISCELLANEOUS) ×4 IMPLANT
DERMABOND ADVANCED (GAUZE/BANDAGES/DRESSINGS) ×2
DERMABOND ADVANCED .7 DNX12 (GAUZE/BANDAGES/DRESSINGS) ×2 IMPLANT
DRAPE C-ARM 42X72 X-RAY (DRAPES) ×4 IMPLANT
DRAPE CHEST BREAST 15X10 FENES (DRAPES) ×4 IMPLANT
DRSG COVADERM 4X10 (GAUZE/BANDAGES/DRESSINGS) ×2 IMPLANT
ELECT REM PT RETURN 9FT ADLT (ELECTROSURGICAL) ×4
ELECTRODE REM PT RTRN 9FT ADLT (ELECTROSURGICAL) ×2 IMPLANT
GAUZE SPONGE 2X2 8PLY STRL LF (GAUZE/BANDAGES/DRESSINGS) IMPLANT
GAUZE SPONGE 4X4 16PLY XRAY LF (GAUZE/BANDAGES/DRESSINGS) ×4 IMPLANT
GLOVE BIO SURGEON STRL SZ 6.5 (GLOVE) ×1 IMPLANT
GLOVE BIO SURGEON STRL SZ7 (GLOVE) ×4 IMPLANT
GLOVE BIO SURGEONS STRL SZ 6.5 (GLOVE) ×1
GLOVE BIOGEL PI IND STRL 6 (GLOVE) IMPLANT
GLOVE BIOGEL PI IND STRL 6.5 (GLOVE) IMPLANT
GLOVE BIOGEL PI IND STRL 7.5 (GLOVE) ×2 IMPLANT
GLOVE BIOGEL PI INDICATOR 6 (GLOVE) ×2
GLOVE BIOGEL PI INDICATOR 6.5 (GLOVE) ×4
GLOVE BIOGEL PI INDICATOR 7.5 (GLOVE) ×2
GLOVE INDICATOR 7.0 STRL GRN (GLOVE) ×2 IMPLANT
GLOVE SURG SS PI 6.5 STRL IVOR (GLOVE) ×2 IMPLANT
GOWN STRL REUS W/ TWL LRG LVL3 (GOWN DISPOSABLE) ×6 IMPLANT
GOWN STRL REUS W/TWL LRG LVL3 (GOWN DISPOSABLE) ×16
GRAFT GORETEX STND 6X20 (Vascular Products) ×4 IMPLANT
GRAFT GORETEXSTD 6X20 (Vascular Products) IMPLANT
HEMOSTAT SPONGE AVITENE ULTRA (HEMOSTASIS) ×2 IMPLANT
KIT BASIN OR (CUSTOM PROCEDURE TRAY) ×4 IMPLANT
KIT ROOM TURNOVER OR (KITS) ×4 IMPLANT
NDL 18GX1X1/2 (RX/OR ONLY) (NEEDLE) ×2 IMPLANT
NDL HYPO 25GX1X1/2 BEV (NEEDLE) ×2 IMPLANT
NEEDLE 18GX1X1/2 (RX/OR ONLY) (NEEDLE) ×8 IMPLANT
NEEDLE HYPO 25GX1X1/2 BEV (NEEDLE) ×4 IMPLANT
NS IRRIG 1000ML POUR BTL (IV SOLUTION) ×4 IMPLANT
PACK CV ACCESS (CUSTOM PROCEDURE TRAY) ×4 IMPLANT
PACK SURGICAL SETUP 50X90 (CUSTOM PROCEDURE TRAY) ×4 IMPLANT
PAD ARMBOARD 7.5X6 YLW CONV (MISCELLANEOUS) ×8 IMPLANT
SET MICROPUNCTURE 5F STIFF (MISCELLANEOUS) IMPLANT
SOAP 2 % CHG 4 OZ (WOUND CARE) ×4 IMPLANT
SPONGE GAUZE 2X2 STER 10/PKG (GAUZE/BANDAGES/DRESSINGS)
STAPLER VISISTAT 35W (STAPLE) ×4 IMPLANT
SUT ETHILON 3 0 PS 1 (SUTURE) ×4 IMPLANT
SUT GORETEX 6.0 TT9 (SUTURE) ×4 IMPLANT
SUT MNCRL AB 4-0 PS2 18 (SUTURE) ×4 IMPLANT
SUT PROLENE 6 0 BV (SUTURE) ×8 IMPLANT
SUT PROLENE 7 0 BV 1 (SUTURE) IMPLANT
SUT VIC AB 2-0 CT1 36 (SUTURE) ×2 IMPLANT
SUT VIC AB 3-0 SH 27 (SUTURE) ×8
SUT VIC AB 3-0 SH 27X BRD (SUTURE) ×4 IMPLANT
SYR 10ML LL (SYRINGE) ×2 IMPLANT
SYR 20CC LL (SYRINGE) ×8 IMPLANT
SYR 3ML LL SCALE MARK (SYRINGE) ×4 IMPLANT
SYR 5ML LL (SYRINGE) ×4 IMPLANT
SYR CONTROL 10ML LL (SYRINGE) ×4 IMPLANT
SYRINGE 10CC LL (SYRINGE) ×4 IMPLANT
TOWEL OR 17X24 6PK STRL BLUE (TOWEL DISPOSABLE) ×4 IMPLANT
TOWEL OR 17X26 4PK STRL BLUE (TOWEL DISPOSABLE) ×4 IMPLANT
UNDERPAD 30X30 (UNDERPADS AND DIAPERS) ×4 IMPLANT
WATER STERILE IRR 1000ML POUR (IV SOLUTION) ×4 IMPLANT
WIRE AMPLATZ SS-J .035X180CM (WIRE) IMPLANT

## 2016-04-15 NOTE — Transfer of Care (Signed)
Immediate Anesthesia Transfer of Care Note  Patient: [name]  Procedure(s) Performed: Procedure(s): INSERTION OF DIALYSIS CATHETER RIGHT INTERNAL JUGULAR (Right) REVISION OF ARTERIOVENOUS GORETEX GRAFT USING 45mmX20cm GORETEX GRAFT (Right)  Patient Location: PACU  Anesthesia Type:General  Level of Consciousness: patient cooperative and responds to stimulation  Airway & Oxygen Therapy: Patient Spontanous Breathing and Patient connected to nasal cannula oxygen  Post-op Assessment: Report given to RN and Post -op Vital signs reviewed and stable  Post vital signs: Reviewed and stable  Last Vitals:  Vitals:   04/15/16 0909  BP: 123/67  Pulse: 89  Resp: 18  Temp: 36.7 C    Last Pain:  Vitals:   04/15/16 0909  TempSrc: Oral      Patients Stated Pain Goal: 3 (33/38/32 9191)  Complications: No apparent anesthesia complications

## 2016-04-15 NOTE — Op Note (Signed)
OPERATIVE NOTE  PROCEDURE: 1.  Right internal jugular vein tunneled dialysis catheter placement 2.  Right internal jugular vein cannulation under ultrasound guidance 3.  Revision of right upper arm arteriovenous graft with interposition graft  PRE-OPERATIVE DIAGNOSIS: end-stage renal failure, contained ruptured right upper arm arteriovenous graft  POST-OPERATIVE DIAGNOSIS: same as above  SURGEON: Adele Barthel, MD  ANESTHESIA: general  ESTIMATED BLOOD LOSS: 30 cc  FINDING(S): 1.  Tips of the catheter in the right atrium on fluoroscopy 2.  No obvious pneumothorax on fluoroscopy 3.  Distal pseudoaneurysm is essentially a contained rupture 4.  Proximal pseudoaneurysm is moderate size  SPECIMEN(S):  none  INDICATIONS:   [name] is a 66 y.o. female who presents with end stage renal disease.  The patient presents for tunneled dialysis catheter placement.  The patient is aware the risks of tunneled dialysis catheter placement include but are not limited to: bleeding, infection, central venous injury, pneumothorax, possible venous stenosis, possible malpositioning in the venous system, and possible infections related to long-term catheter presence.  The patient was aware of these risks and agreed to proceed.  DESCRIPTION: After written full informed consent was obtained from the patient, the patient was taken back to the operating room.  Prior to induction, the patient was given IV antibiotics.  After obtaining adequate sedation, the patient was prepped and draped in the standard fashion for a chest or neck tunneled dialysis catheter placement.   I looked for a left internal jugular vein but there was no visible internal jugular vein, so I changed to the right side.    Under ultrasound guidance, the right internal jugular vein was cannulated with the 18 gauge needle.  A J-wire was then placed down into the inferior vena cava under fluoroscopic guidance.  The wire was then secured  in place with a clamp to the drapes.  I then made stab incisions at the neck and exit sites.   I dissected from the exit site to the cannulation site with a tunneler.   The subcutaneous tunnel was dilated by passing a plastic dilator over the metal dissector. The wire was then unclamped and I removed the needle.  The skin tract and venotomy was dilated serially with dilators.  Finally, the dilator-sheath was placed under fluoroscopic guidance into the superior vena cava.  The dilator and wire were removed.  A 23 cm Palindrome catheter was placed under fluoroscopic guidance down into the right atrium.  The sheath was broken and peeled away while holding the catheter cuff at the level of the skin.  The back end of this catheter was transected, and docked onto the tunneler.  The distal catheter was delivered through the subcutaneous tunnel.  The catheter was transected a second time, revealing the two lumens of this catheter.  The ports were docked onto these two lumens.  The catheter collar was then snapped into place.  Each port was tested by aspirating and flushing.  No resistance was noted.  Each port was then thoroughly flushed with heparinized saline.  The catheter was secured in placed with two interrupted stitches of 3-0 Nylon tied to the catheter.  The neck incision was closed with a U-stitch of 4-0 Monocryl.  The neck and chest incision were cleaned and sterile bandages applied.  Each port was then loaded with concentrated heparin (1000 Units/mL) at the manufacturer recommended volumes to each port.  Sterile caps were applied to each port.    On completion fluoroscopy, the tips of  the catheter were in the right atrium, and there was no evidence of pneumothorax.  At this point, the drapes were taken down.  The patient was repositioned for a right arm access procedure.  The patient was reprepped and redraped.  I made a longitudinal incision over the distal right upper arm arteriovenous graft.  I dissected  out the graft and placed a vessel loop around the graft.  I then turned my attention to the axilla and made a longitudinal incision over the arteriovenous graft.  I dissected out the graft and placed a vessel loop around the graft.  The patient was given 6000 units of Heparin intravenously, which was a therapeutic bolus.  After waiting 3 minutes, I clamped the graft proximally and distally.  I extended the incision proximally along the graft, connecting the two incision.  In the process of making this incision, I made elliptical incisions over the two large pseudoaneurysm.  I dissected out the rest of the graft with electrocautery.  In this process, I got into two both pseudoaneurysmal lumens.  The most distal pseudoaneurysm was essentially a contained rupture from the medial wall of the graft.  The proximal pseudoaneurysm was consistent with moderate sized pseudoaneurysm.    I dissected out the two pseudoaneurysm sacs with electrocautery.  I sharply resected some of the cicatrix to freshen the skin edge.  I spatulated both end of this old graft segment to facilitate an end-to-end anastomosis, in the process, I excised the ruptured graft segment.  I spatulated a 6 mm segment of Goretex to facilitate an end-to-end anastomosis.  I sewed the distal old graft to the new graft with a running stitch of CV-6.  I pulled the graft to appropriate tension.  I spatulated the new segment of 6 mm graft, sewing the new to old graft with a running stitch of CV-6 in an end-to-end configuration.  I backbled the venous end of this graft prior to completing the anastomosis.  There was no clot.  I then allowed the arterial end of this graft to bleed.  There was no clot.  I washed out the graft and completed the anastomosis in the usual fashion.    I then gave the patient 30 mg of Protamine.  I packed the incision with Avitene.  After waiting a few minutes, I controlled a few more bleeding point with electrocautery.  I repacked the  incision with Avitene.  After waiting a few more minutes, the active bleeding was controlled.  I reapproximated the deep subcutaneous tissue around the graft with a running stitch of 2-0 Vicryl.  The subdermal layer was reapproximated with a running stitch of 3-0 Vicryl.  The skin was reapproximated with staples due to reapproximate the traumatized, from prior cannulation, skin.  A Coverderm was applied to the right upper arm.  At the end of the case, there was a palpable thrill in the graft with a dopplerable radial signal.      COMPLICATIONS: none  CONDITION: stable   Adele Barthel, MD, [facility] [place] Office: [phone] Pager: [phone]  04/15/2016, 12:02 PM

## 2016-04-15 NOTE — H&P (View-Only) (Signed)
Referred by:  Dr. Madelon Lips (Nephrology)  Reason for referral: New access  History of Present Illness  [name] is a 66 y.o. (Jan 15, 1951) female who presents for evaluation of right upper arm arteriovenous graft.  The patient is left hand dominant.  The patient has previously had R FA AVG, R UA AVG and redo RUA AVG.  Previous central venous cannulation procedures include: RIJV TDC.  The patient has never had a PPM placed.  Pt denies any bleeding complications but notes her dialysis center is concerned with the skin overlying two over her cannulation sites.  She denies any steal from the RUA AVG  Past Medical History:  Diagnosis Date  . Anemia    anemia of chronic disease likely 2/2 ESRD per last anemia panel (02/2011) with Fe 35, TIBC 167, ferritin 2041  // BL Hgb 8-10  . Anuria    Due to dialysis  . Aortic stenosis, mild    03/2011 echo  . Arthritis    Back  . Blood transfusion 1990's   r/t Kidney removal surgery  . Brain aneurysm    No records could be found  . Colitis, ischemic ([place]) 01/2011   S/P partial colectomy of right hemicolon and ileostomy placement  . Diabetes mellitus    Borderline  . Dialysis patient [facility])    [place] Tues,Thursday, saturday  . Dysrhythmia    SVT for brief period in Feb 2013  . End stage renal disease on dialysis [facility])    Secondary to hypertension // T/Th/Sat dialysis on [employer]  . Foot drop, bilateral    after back surgery  . Heart murmur    Born with heart mumur, does not require follow up per pt  . Hepatitis C   . Hypertension    Does not see a heart doctor, had pre transplant stress test at [facility]    . Lumbar spinal stenosis     bilateral L4-5 and L5-S1 posterior lumbar interbody arthrodesis and bilateral L4-S1 posterior lateral arthrodesis for lumbar stenosis dynamic lumbar spondylolisthesis lumbar spondylosis lumbar radiculopathy with foot drop // s/p surgery 01/2011 - see surgery section for details.  .  Seizures ([place])    r/t HTN in 1990's x 1  . Umbilical hernia age 37    Past Surgical History:  Procedure Laterality Date  . APPENDECTOMY  1960's  . AV FISTULA PLACEMENT  04/08/2011   Procedure: INSERTION OF ARTERIOVENOUS (AV) GORE-TEX GRAFT ARM;  Surgeon: Rosetta Posner, MD;  Location: [facility] OR;  Service: Vascular;  Laterality: Right;  Revision of Upper Arm Gore-Tex Graft  . BACK SURGERY  01/30/2011   L4-S1 lumbar laminectomy facetectomy and foraminotomies for decompression a bilateral L4-5 and L5-S1 posterior lumbar interbody arthrodesis and bilateral L4-S1 posterior lateral arthrodesis for lumbar stenosis dynamic lumbar spondylolisthesis lumbar spondylosis lumbar radiculopathy with foot drop  . COLONOSCOPY  02/19/2011   Procedure: COLONOSCOPY;  Surgeon: Missy Sabins, MD;  Location: [place];  Service: Endoscopy;  Laterality: N/A;  . COLONOSCOPY  03/07/2011   Procedure: COLONOSCOPY;  Surgeon: Cleotis Nipper, MD;  Location: [facility] ENDOSCOPY;  Service: Endoscopy;  Laterality: N/A;  no prep needed--pt has long Hartmann's pouch and ileostomy  . COLOSTOMY  02/09/2011   Reason for surgery: ischemic colitis of right hemicolon requiring partial colectomy and ileostomy.  Surgeon: Harl Bowie, MD;  Location: [place];  Service: General;  Laterality: Right;  . ILEOSTOMY CLOSURE  05/29/2011   Procedure: ILEOSTOMY TAKEDOWN;  Surgeon: Marco Collie  Ninfa Linden, MD;  Location: [place];  Service: General;  Laterality: N/A;  . LAPAROTOMY  02/09/2011   Procedure: EXPLORATORY LAPAROTOMY;  Surgeon: Harl Bowie, MD;  Location: [place];  Service: General;  Laterality: N/A;  . NEPHRECTOMY  2010   right side done at [facility] on transplant list  . PARTIAL COLECTOMY  02/09/2011   Reason for surgery: ischemic colitis of right hemicolon; Surgeon: Harl Bowie, MD;  Location: [place];  Service: General;  Laterality: Right;  . RIB RESECTION     d/t kidney removal  . TONSILLECTOMY     as a child  . total  parathyroidectomy  01/17/2001   with autotransplantation into left forearm // due to secondary hyperparathyroidism from ESRD  . UMBILICAL HERNIA REPAIR  age 35- 38  . VASCULAR SURGERY     right arm dialysis graft    Social History   Social History  . Marital status: Legally Separated    Spouse name: N/A  . Number of children: 2  . Years of education: 10th grade   Occupational History  . Retired     previously worked as a Producer, television/film/video History Main Topics  . Smoking status: Current Some Day Smoker    Packs/day: 0.50    Years: 48.00    Types: Cigarettes    Last attempt to quit: 08/17/2010  . Smokeless tobacco: Never Used  . Alcohol use No     Comment: 1 pint on Fridays  . Drug use: No  . Sexual activity: No   Other Topics Concern  . Not on file   Social History Narrative   Lives in [place] with daughter. Has 2 On HD since for 20 years. Worked as a Sports coach. Went to 10th grade. Can read and write.     Family History  Problem Relation Age of Onset  . Diabetes Father   . Stroke Mother   . COPD Brother   . Heart attack Brother   . Anesthesia problems Neg Hx   . Hypotension Neg Hx   . Malignant hyperthermia Neg Hx   . Pseudochol deficiency Neg Hx     Current Outpatient Prescriptions  Medication Sig Dispense Refill  . B Complex-C-Zn-Folic Acid (DIALYVITE 950 WITH ZINC) 0.8 MG TABS Take 1 tablet by mouth daily.    . calcium carbonate (OS-CAL) 1250 MG chewable tablet Chew 1 tablet by mouth daily.    . darbepoetin (ARANESP) 200 MCG/0.4ML SOLN Inject 200 mcg into the skin every 7 (seven) days. Administered at dialysis per nephrology; taken every Thursday.    . Multiple Vitamin (MULTIVITAMIN WITH MINERALS) TABS Take 1 tablet by mouth daily.    Marland Kitchen gabapentin (NEURONTIN) 100 MG capsule Take 100 mg by mouth daily.    Marland Kitchen HYDROcodone-acetaminophen (NORCO) 5-325 MG per tablet Take 1-2 tablets by mouth every 6 (six) hours as needed. (Patient not taking: Reported on 04/12/2016) 30  tablet 0  . HYDROcodone-acetaminophen (NORCO/VICODIN) 5-325 MG per tablet Take 1 tablet by mouth every 6 (six) hours as needed. (Patient not taking: Reported on 04/12/2016) 30 tablet 0  . loperamide (IMODIUM) 2 MG capsule Take 2 mg by mouth every 3 (three) hours as needed.     No current facility-administered medications for this visit.     Allergies  Allergen Reactions  . Contrast Media [Iodinated Diagnostic Agents]     Seziure  . Iohexol Other (See Comments)    Reaction is convulsions    REVIEW OF SYSTEMS:   Cardiac:  positive for: no symptoms, negative for: Chest pain or chest pressure, Shortness of breath upon exertion and Shortness of breath when lying flat,   Vascular:  positive for: no symptoms,  negative for: Pain in calf, thigh, or hip brought on by ambulation, Pain in feet at night that wakes you up from your sleep, Blood clot in your veins and Leg swelling  Pulmonary:  positive for: no symptoms,  negative for: Oxygen at home, Productive cough and Wheezing  Neurologic:  positive for: No symptoms, negative for: Sudden weakness in arms or legs, Sudden numbness in arms or legs, Sudden onset of difficulty speaking or slurred speech, Temporary loss of vision in one eye and Problems with dizziness  Gastrointestinal:  positive for: no symptoms, negative for: Blood in stool and Vomited blood  Genitourinary:  positive for: end stage renal disease-HD: T/R/S negative for: Burning when urinating and Blood in urine  Psychiatric:  positive for: no symptoms,  negative for: Major depression  Hematologic:  positive for: no symptoms,  negative for: negative for: Bleeding problems and Problems with blood clotting too easily  Dermatologic:  positive for: no symptoms, negative for: Rashes or ulcers  Constitutional:  positive for: no symptoms, negative for: Fever or chills  Ear/Nose/Throat:  positive for: no symptoms, negative for: Change in hearing, Nose bleeds and Sore  throat  Musculoskeletal:  positive for: no symptoms, negative for: Back pain, Joint pain and Muscle pain   Physical Examination  Vitals:   04/12/16 1314  BP: 96/68  Pulse: (!) 102  Resp: 18  Temp: 97.3 F (36.3 C)  TempSrc: Oral  SpO2: 98%  Weight: 115 lb 14.4 oz (52.6 kg)  Height: 5\' 5"  (1.651 m)    Body mass index is 19.29 kg/m.  General: Alert, O x 3, WD,NAD  Head: [place]/AT,   Ear/Nose/Throat: Hearing grossly intact, nares without erythema or drainage, oropharynx without Erythema or Exudate , Mallampati score: 3, Dentition intact  Eyes: PERRLA, EOMI,   Neck: Supple, mid-line trachea,    Pulmonary: Sym exp, good B air movt,CTA B  Cardiac: RRR, Nl S1, S2, no Murmurs, No rubs, No S3,S4  Vascular: Vessel Right Left  Radial Palpable Palpable  Brachial Palpable Palpable  Carotid Palpable, No Bruit Palpable, No Bruit  Aorta Not palpable N/A  Femoral Palpable Palpable  Popliteal Not palpable Not palpable  PT Palpable Not palpable  DP Not palpable Palpable   Gastrointestinal: soft, non-distended, non-tender to palpation, No guarding or rebound, no HSM, no masses, no CVAT B, No palpable prominent aortic pulse,    Musculoskeletal: M/S 5/5 throughout  , Extremities without ischemic changes  , No edema present, B calf external braces, two moderate to large size PSA in RUA AVG, skin is attenuated in both (distal>proximal), thrombosed RUA AVG medial to active AVG  Neurologic: CN 2-12 intact, Pain and light touch intact in extremities, Motor exam as listed above  Psychiatric: Judgement intact, Mood & affect appropriate for pt's clinical situation  Dermatologic: See M/S exam for extremity exam, No rashes otherwise noted  Lymph : Palpable lymph nodes: None   Outside Studies/Documentation 10 pages of outside documents were reviewed including: outside nephrology chart.   Medical Decision Making  MONTANNA MCBAIN is a 66 y.o. female who presents with ESRD requiring  hemodialysis, RUA with two PSA concerning for imminent rupture   I recommended to the patient: excision of the segment of AVG that is frankly ruptured x 2 and replacing with interposition graft.  This will require  placement of TDC while the wound heals. Risk, benefits, and alternatives to access surgery were discussed.   The patient is aware the risks include but are not limited to: bleeding, infection, steal syndrome, nerve damage, ischemic monomelic neuropathy, thrombosis, failure to mature, need for additional procedures, death and stroke.   The patient is aware the risks of tunneled dialysis catheter placement include but are not limited to: bleeding, infection, central venous injury, pneumothorax, possible venous stenosis, possible malpositioning in the venous system, and possible infections related to long-term catheter presence.   The patient has agreed to proceed with the above procedure which will be scheduled this coming Monday 29 JAN 18.   Adele Barthel, MD [facility] [place] Office: [phone] Pager: [phone]  04/12/2016, 1:33 PM

## 2016-04-15 NOTE — Anesthesia Postprocedure Evaluation (Signed)
Anesthesia Post Note  Patient: ADREONA BRAND  Procedure(s) Performed: Procedure(s) (LRB): INSERTION OF DIALYSIS CATHETER RIGHT INTERNAL JUGULAR (Right) REVISION OF ARTERIOVENOUS GORETEX GRAFT USING 7mmX20cm GORETEX GRAFT (Right)  Patient location during evaluation: PACU Anesthesia Type: General Level of consciousness: awake and alert Pain management: pain level controlled Vital Signs Assessment: post-procedure vital signs reviewed and stable Respiratory status: spontaneous breathing, nonlabored ventilation, respiratory function stable and patient connected to nasal cannula oxygen Cardiovascular status: blood pressure returned to baseline and stable Postop Assessment: no signs of nausea or vomiting Anesthetic complications: no       Last Vitals:  Vitals:   04/15/16 1539 04/15/16 1544  BP:    Pulse: 80 92  Resp: (!) 30 20  Temp: 36.1 C     Last Pain:  Vitals:   04/15/16 1544  TempSrc:   PainSc: 2                  Barney Gertsch DAVID

## 2016-04-15 NOTE — Anesthesia Preprocedure Evaluation (Signed)
Anesthesia Evaluation  Patient identified by MRN, date of birth, ID band Patient awake    Reviewed: Allergy & Precautions, NPO status , Patient's Chart, lab work & pertinent test results  Airway Mallampati: I  TM Distance: >3 FB Neck ROM: Full    Dental   Pulmonary former smoker,    Pulmonary exam normal        Cardiovascular hypertension, Normal cardiovascular exam+ dysrhythmias      Neuro/Psych    GI/Hepatic (+) Hepatitis -, C  Endo/Other  diabetes  Renal/GU Dialysis and ESRFRenal disease     Musculoskeletal   Abdominal   Peds  Hematology   Anesthesia Other Findings   Reproductive/Obstetrics                             Anesthesia Physical Anesthesia Plan  ASA: III  Anesthesia Plan: General   Post-op Pain Management:    Induction: Intravenous  Airway Management Planned: LMA  Additional Equipment:   Intra-op Plan:   Post-operative Plan: Extubation in OR  Informed Consent: I have reviewed the patients History and Physical, chart, labs and discussed the procedure including the risks, benefits and alternatives for the proposed anesthesia with the patient or authorized representative who has indicated his/her understanding and acceptance.     Plan Discussed with: CRNA and Surgeon  Anesthesia Plan Comments:         Anesthesia Quick Evaluation

## 2016-04-15 NOTE — Interval H&P Note (Signed)
History and Physical Interval Note:  04/15/2016 10:58 AM  [name]  has presented today for surgery, with the diagnosis of End Stage Renal Disease N18.6; Right upper arm pseudoaneurysm I72.9  The various methods of treatment have been discussed with the patient and family. After consideration of risks, benefits and other options for treatment, the patient has consented to  Procedure(s): INSERTION OF DIALYSIS CATHETER (N/A) REVISION OF ARTERIOVENOUS GORETEX GRAFT (Right) as a surgical intervention .  The patient's history has been reviewed, patient examined, no change in status, stable for surgery.  I have reviewed the patient's chart and labs.  Questions were answered to the patient's satisfaction.     Adele Barthel

## 2016-04-16 ENCOUNTER — Encounter (HOSPITAL_COMMUNITY): Payer: Self-pay | Admitting: Vascular Surgery

## 2016-04-16 ENCOUNTER — Encounter: Payer: Self-pay | Admitting: Nephrology

## 2016-04-23 ENCOUNTER — Encounter: Payer: Self-pay | Admitting: Family

## 2016-04-30 ENCOUNTER — Ambulatory Visit (INDEPENDENT_AMBULATORY_CARE_PROVIDER_SITE_OTHER): Payer: Self-pay | Admitting: Family

## 2016-04-30 ENCOUNTER — Encounter: Payer: Self-pay | Admitting: Family

## 2016-04-30 VITALS — BP 118/72 | HR 92 | Temp 97.0°F | Resp 16 | Ht 65.0 in | Wt 120.0 lb

## 2016-04-30 DIAGNOSIS — T82898D Other specified complication of vascular prosthetic devices, implants and grafts, subsequent encounter: Secondary | ICD-10-CM

## 2016-04-30 DIAGNOSIS — T82511D Breakdown (mechanical) of surgically created arteriovenous shunt, subsequent encounter: Secondary | ICD-10-CM

## 2016-04-30 DIAGNOSIS — Z4802 Encounter for removal of sutures: Secondary | ICD-10-CM

## 2016-04-30 NOTE — Progress Notes (Signed)
    Postoperative Access Visit   History of Present Illness  [name] is a 66 y.o. year old female who is s/p  1.  Right internal jugular vein tunneled dialysis catheter placement 2.  Right internal jugular vein cannulation under ultrasound guidance 3.  Revision of right upper arm arteriovenous graft with interposition graft  On 04-15-16 by Dr. Bridgett Larsson. FINDING(S): 1.  Tips of the catheter in the right atrium on fluoroscopy 2.  No obvious pneumothorax on fluoroscopy 3.  Distal pseudoaneurysm is essentially a contained rupture 4.  Proximal pseudoaneurysm is moderate size  She returns today for staples removal.   She dialyzes T-TH-S via right upper chest catheter.  Pt denies fever or chills, denies steal symptoms in her right forearm or hand. She reports arthritis in both hands.   The patient's right upper arm incision is healing well  The patient notes no steal symptoms.  The patient is able to complete their activities of daily living.  The patient's current symptoms are: none.  For VQI Use Only  PRE-ADM LIVING: Home  AMB STATUS: Ambulatory  Physical Examination Vitals:   04/30/16 1039  BP: 118/72  Pulse: 92  Resp: 16  Temp: 97 F (36.1 C)  TempSrc: Oral  SpO2: 100%  Weight: 120 lb (54.4 kg)  Height: 5\' 5"  (1.651 m)   Body mass index is 19.97 kg/m.   Right upper arm incision is healing well, incision edges are well proximated, no swelling, no erythema, no drainage, hand grip is 5/5, sensation in digits is intact, no palpable thrill, bruit can be auscultated.  Medical Decision Making  [name] is a 66 y.o. year old female who presents s/p right internal jugular vein tunneled dialysis catheter placement, right internal jugular vein cannulation under ultrasound guidance, and revision of right upper arm arteriovenous graft with interposition graft on 04-15-16.   All staples removed today and steri strips placed.  I advised pt to notify us if she develops  fever or chills, or tingling, numbness, pain, or cold sensation in her right hand or forearm.  Follow up with Dr. Bridgett Larsson as already scheduled on 05-17-16.  Thank you for allowing [place] to participate in this patient's care.  Katye Valek, Sharmon Leyden, RN, MSN, FNP-C [facility] [place] Office: [phone]  04/30/2016, 10:47 AM  Clinic MD: Early

## 2016-05-10 ENCOUNTER — Encounter: Payer: Self-pay | Admitting: Vascular Surgery

## 2016-05-16 NOTE — Progress Notes (Deleted)
    Postoperative Access Visit   History of Present Illness  [name] is a 66 y.o. year old female who presents for postoperative follow-up for: RIJV TDC, Revision of RUA AVG  (Date: 04/15/16).  The patient's wounds are *** healed.  The patient notes *** steal symptoms.  The patient is *** able to complete their activities of daily living.  The patient's current symptoms are: ***.  For VQI Use Only  PRE-ADM LIVING: Home  AMB STATUS: Ambulatory  Physical Examination There were no vitals filed for this visit.  LUE: Incision is *** healed, skin feels ***, hand grip is ***/5, sensation in digits is *** intact, ***palpable thrill, bruit can *** be auscultated   Medical Decision Making  [name] is a 66 y.o. year old female who presents s/p RIJV TDC, Revision RUA AVG    The patient's access is *** ready for use.  The patient's tunneled dialysis catheter can be removed after two successful cannulations and completed dialysis treatments.  Thank you for allowing [place] to participate in this patient's care.  Adele Barthel, MD, FACS [facility] [place] Office: [phone] Pager: [phone]

## 2016-05-17 ENCOUNTER — Encounter: Payer: Medicare Other | Admitting: Vascular Surgery

## 2016-05-17 ENCOUNTER — Encounter: Payer: Self-pay | Admitting: Vascular Surgery

## 2016-05-20 NOTE — Progress Notes (Signed)
    Postoperative Access Visit   History of Present Illness  JAZELLE ACHEY is a 66 y.o. year old female who presents for postoperative follow-up for: RIJV TDC, Revision of RUA AVG with interposition graft (Date: 04/15/16).  The patient's wounds are healed.  The patient notes no steal symptoms.  The patient is able to complete their activities of daily living.  The patient's current symptoms are: none.  For VQI Use Only  PRE-ADM LIVING: Home  AMB STATUS: Ambulatory with Assistance  Physical Examination Vitals:   05/24/16 1022  BP: (!) 146/89  Pulse: 72  Resp: 16  Temp: 98.3 F (36.8 C)    RUE: Incision is healed, skin feels warm, hand grip is 5/5, sensation in digits is intact, palpable thrill, bruit can be auscultated   Medical Decision Making  ALUNA WHISTON is a 66 y.o. year old female who presents s/p RIJV TDC, revision of RUA AVG w/ interposition graft.   The patient's access is ready for use.  The patient's tunneled dialysis catheter can be removed after two successful cannulations and completed dialysis treatments.  Thank you for allowing [place] to participate in this patient's care.  Adele Barthel, MD, FACS [facility] [place] Office: [phone] Pager: [phone]

## 2016-05-24 ENCOUNTER — Ambulatory Visit (INDEPENDENT_AMBULATORY_CARE_PROVIDER_SITE_OTHER): Payer: Self-pay | Admitting: Vascular Surgery

## 2016-05-24 ENCOUNTER — Encounter: Payer: Self-pay | Admitting: Vascular Surgery

## 2016-05-24 VITALS — BP 146/89 | HR 72 | Temp 98.3°F | Resp 16 | Ht 65.0 in | Wt 114.0 lb

## 2016-05-24 DIAGNOSIS — N186 End stage renal disease: Secondary | ICD-10-CM

## 2016-05-24 DIAGNOSIS — Z992 Dependence on renal dialysis: Secondary | ICD-10-CM

## 2016-12-21 ENCOUNTER — Emergency Department (HOSPITAL_BASED_OUTPATIENT_CLINIC_OR_DEPARTMENT_OTHER): Payer: Medicare Other

## 2016-12-21 ENCOUNTER — Emergency Department (HOSPITAL_BASED_OUTPATIENT_CLINIC_OR_DEPARTMENT_OTHER)
Admission: EM | Admit: 2016-12-21 | Discharge: 2016-12-21 | Disposition: A | Discharge status: Home / Self Care | Payer: Medicare Other | Attending: Emergency Medicine | Admitting: Emergency Medicine

## 2016-12-21 ENCOUNTER — Encounter (HOSPITAL_BASED_OUTPATIENT_CLINIC_OR_DEPARTMENT_OTHER): Payer: Self-pay | Admitting: *Deleted

## 2016-12-21 DIAGNOSIS — Y999 Unspecified external cause status: Secondary | ICD-10-CM | POA: Insufficient documentation

## 2016-12-21 DIAGNOSIS — S99912A Unspecified injury of left ankle, initial encounter: Secondary | ICD-10-CM | POA: Diagnosis present

## 2016-12-21 DIAGNOSIS — E119 Type 2 diabetes mellitus without complications: Secondary | ICD-10-CM | POA: Insufficient documentation

## 2016-12-21 DIAGNOSIS — X58XXXA Exposure to other specified factors, initial encounter: Secondary | ICD-10-CM | POA: Insufficient documentation

## 2016-12-21 DIAGNOSIS — Y929 Unspecified place or not applicable: Secondary | ICD-10-CM | POA: Diagnosis not present

## 2016-12-21 DIAGNOSIS — Y9301 Activity, walking, marching and hiking: Secondary | ICD-10-CM | POA: Insufficient documentation

## 2016-12-21 DIAGNOSIS — I10 Essential (primary) hypertension: Secondary | ICD-10-CM | POA: Insufficient documentation

## 2016-12-21 DIAGNOSIS — Z79899 Other long term (current) drug therapy: Secondary | ICD-10-CM | POA: Diagnosis not present

## 2016-12-21 DIAGNOSIS — F17210 Nicotine dependence, cigarettes, uncomplicated: Secondary | ICD-10-CM | POA: Diagnosis not present

## 2016-12-21 DIAGNOSIS — S93402A Sprain of unspecified ligament of left ankle, initial encounter: Secondary | ICD-10-CM | POA: Diagnosis not present

## 2016-12-21 NOTE — ED Provider Notes (Signed)
[place] DEPT [facility] Provider Note   CSN: [id number] Arrival date & time: 12/21/16  1007     History   Chief Complaint Chief Complaint  Patient presents with  . Ankle Injury    HPI [name] is a 66 y.o. female with a history of foot drop presents to the emergency department with a chief complaint of sudden onset of left ankle pain that began at approximately 12:30 AM. The patient reports she had just got out of the shower and was not wearing her foot braces for her foot drop to go downstairs and get changed. She states she thought she was at the last step, and realized she had one step left when she lost her footing and twisted her left ankle. She denies falling, LOC, head hitting her head, nausea, or emesis. No treatment prior to arrival. No history of previous left ankle injuries or surgeries. She denies numbness, pain to the right foot or ankle or the bilateral knees.  The history is provided by the patient. No language interpreter was used.    Past Medical History:  Diagnosis Date  . Anemia    anemia of chronic disease likely 2/2 ESRD per last anemia panel (02/2011) with Fe 35, TIBC 167, ferritin 2041  // BL Hgb 8-10  . Anuria    Due to dialysis  . Aortic stenosis, mild    03/2011 echo  . Arthritis    Back  . Blood transfusion 1990's   r/t Kidney removal surgery  . Brain aneurysm    No records could be found  . Colitis, ischemic ([place]) 01/2011   S/P partial colectomy of right hemicolon and ileostomy placement  . Diabetes mellitus    Borderline  . Dialysis patient [facility])    [place] Tues,Thursday, saturday  . Dysrhythmia    SVT for brief period in Feb 2013  . End stage renal disease on dialysis [facility])    Secondary to hypertension // T/Th/Sat dialysis on [employer]  . Foot drop, bilateral    after back surgery  . Heart murmur    Born with heart mumur, does not require follow up per pt  . Hepatitis C   . Hypertension    Does not see a heart  doctor, had pre transplant stress test at [facility]    . Lumbar spinal stenosis     bilateral L4-5 and L5-S1 posterior lumbar interbody arthrodesis and bilateral L4-S1 posterior lateral arthrodesis for lumbar stenosis dynamic lumbar spondylolisthesis lumbar spondylosis lumbar radiculopathy with foot drop // s/p surgery 01/2011 - see surgery section for details.  . Seizures ([place])    r/t HTN in 1990's x 1  . Umbilical hernia age 75    Patient Active Problem List   Diagnosis Date Noted  . Muscle weakness (generalized) 05/15/2012  . Abnormality of gait 05/15/2012  . Other acquired deformity of ankle and foot(736.79) 05/15/2012  . Lumbar radiculitis 01/17/2012  . Post herpetic neuralgia 11/25/2011  . Leg pain, right 10/14/2011  . Valtrex overdose 10/01/2011  . Shingles rash 09/30/2011  . Preop cardiovascular exam 05/28/2011  . Sinus tachycardia 04/23/2011  . Aortic stenosis 04/23/2011  . Ileostomy in place [facility]) 04/22/2011  . Anemia of chronic kidney failure 02/09/2011  . Acute ischemic colitis ([place]) 02/09/2011  . Lumbar stenosis with neurogenic claudication 01/30/2011  . Spondylolisthesis of lumbar region 01/30/2011  . End stage renal disease on dialysis ([place]) 01/30/2011  . Hypertension 01/30/2011  . Unspecified viral hepatitis C without hepatic coma 01/30/2011  Past Surgical History:  Procedure Laterality Date  . APPENDECTOMY  1960's  . AV FISTULA PLACEMENT  04/08/2011   Procedure: INSERTION OF ARTERIOVENOUS (AV) GORE-TEX GRAFT ARM;  Surgeon: Rosetta Posner, MD;  Location: [facility] OR;  Service: Vascular;  Laterality: Right;  Revision of Upper Arm Gore-Tex Graft  . BACK SURGERY  01/30/2011   L4-S1 lumbar laminectomy facetectomy and foraminotomies for decompression a bilateral L4-5 and L5-S1 posterior lumbar interbody arthrodesis and bilateral L4-S1 posterior lateral arthrodesis for lumbar stenosis dynamic lumbar spondylolisthesis lumbar spondylosis lumbar radiculopathy with foot drop  .  COLONOSCOPY  02/19/2011   Procedure: COLONOSCOPY;  Surgeon: Missy Sabins, MD;  Location: [place];  Service: Endoscopy;  Laterality: N/A;  . COLONOSCOPY  03/07/2011   Procedure: COLONOSCOPY;  Surgeon: Cleotis Nipper, MD;  Location: [facility] ENDOSCOPY;  Service: Endoscopy;  Laterality: N/A;  no prep needed--pt has long Hartmann's pouch and ileostomy  . COLOSTOMY  02/09/2011   Reason for surgery: ischemic colitis of right hemicolon requiring partial colectomy and ileostomy.  Surgeon: Harl Bowie, MD;  Location: [place];  Service: General;  Laterality: Right;  . ILEOSTOMY CLOSURE  05/29/2011   Procedure: ILEOSTOMY TAKEDOWN;  Surgeon: Harl Bowie, MD;  Location: [place];  Service: General;  Laterality: N/A;  . INSERTION OF DIALYSIS CATHETER Right 04/15/2016   Procedure: INSERTION OF DIALYSIS CATHETER RIGHT INTERNAL JUGULAR;  Surgeon: Conrad Clifton, MD;  Location: [place];  Service: Vascular;  Laterality: Right;  . LAPAROTOMY  02/09/2011   Procedure: EXPLORATORY LAPAROTOMY;  Surgeon: Harl Bowie, MD;  Location: [place];  Service: General;  Laterality: N/A;  . NEPHRECTOMY  2010   right side done at [facility] on transplant list  . PARTIAL COLECTOMY  02/09/2011   Reason for surgery: ischemic colitis of right hemicolon; Surgeon: Harl Bowie, MD;  Location: [place];  Service: General;  Laterality: Right;  . REVISION OF ARTERIOVENOUS GORETEX GRAFT Right 04/15/2016   Procedure: REVISION OF ARTERIOVENOUS GORETEX GRAFT USING 91mmX20cm GORETEX GRAFT;  Surgeon: Conrad Madisonburg, MD;  Location: [place];  Service: Vascular;  Laterality: Right;  . RIB RESECTION     d/t kidney removal  . TONSILLECTOMY     as a child  . total parathyroidectomy  01/17/2001   with autotransplantation into left forearm // due to secondary hyperparathyroidism from ESRD  . UMBILICAL HERNIA REPAIR  age 49- 57  . VASCULAR SURGERY     right arm dialysis graft    OB History    No data available       Home Medications     Prior to Admission medications   Medication Sig Start Date End Date Taking? Authorizing Provider  b complex-vitamin c-folic acid (NEPHRO-VITE) 0.8 MG TABS tablet Take 1 tablet by mouth daily.   Yes [provider]  calcium carbonate (CALCI-CHEW) 1250 (500 Ca) MG chewable tablet Chew 2 tablets by mouth 3 (three) times daily after meals.   Yes [provider]  diphenhydrAMINE (BENADRYL) 25 MG tablet Take 50 mg by mouth daily as needed (cold symptoms).   Yes [provider]    Family History Family History  Problem Relation Age of Onset  . Diabetes Father   . Stroke Mother   . COPD Brother   . Heart attack Brother   . Bronchitis Sister   . Anesthesia problems Neg Hx   . Hypotension Neg Hx   . Malignant hyperthermia Neg Hx   . Pseudochol deficiency Neg Hx  Social History Social History  Substance Use Topics  . Smoking status: Current Every Day Smoker    Packs/day: 0.50    Years: 48.00    Types: Cigarettes  . Smokeless tobacco: Never Used     Comment: 1 pk lasts 2-3 days.   . Alcohol use 1.8 - 2.4 oz/week    3 - 4 Shots of liquor per week     Comment: 3-4 shots of liquor on Fridays     Allergies   Contrast media [iodinated diagnostic agents] and Iohexol   Review of Systems Review of Systems  Musculoskeletal: Positive for arthralgias, gait problem, joint swelling and myalgias.  Neurological: Negative for numbness.     Physical Exam Updated Vital Signs BP 107/68 (BP Location: Left Arm)   Pulse 90   Temp 98.2 F (36.8 C) (Oral)   Resp 16   Ht 5\' 5"  (1.651 m)   Wt 54 kg (119 lb)   SpO2 96%   BMI 19.80 kg/m   Physical Exam  Constitutional: No distress.  HENT:  Head: Normocephalic.  Eyes: Conjunctivae are normal.  Neck: Neck supple.  Cardiovascular: Normal rate and regular rhythm.  Exam reveals no gallop and no friction rub.   No murmur heard. Pulmonary/Chest: Effort normal. No respiratory distress.  Abdominal: Soft. She  exhibits no distension.  Musculoskeletal: She exhibits edema and tenderness. She exhibits no deformity.  Tender to palpation over the left lateral malleolus. 5 out of 5 strength against resistance with plantar flexion. Decreased strength against resistance with dorsiflexion secondary to the patient's history of foot drop. DP and PT pulses are 2+ and symmetric. Sensation is intact throughout the left lower extremity, ankle, and foot. No overlying erythema or ecchymosis. No obvious deformity. No step-offs. No tenderness to palpation over the left foot or medial malleolus. Normal left knee exam.  Neurological: She is alert.  Skin: Skin is warm. No rash noted.  Psychiatric: Her behavior is normal.  Nursing note and vitals reviewed.    ED Treatments / Results  Labs (all labs ordered are listed, but only abnormal results are displayed) Labs Reviewed - No data to display  EKG  EKG Interpretation None       Radiology Dg Ankle Complete Left  Result Date: 12/21/2016 CLINICAL DATA:  66 year-old female states she twisted her LEFT ankle yesterday when she was coming down some steps. C/o lateral malleolus pain and swelling EXAM: LEFT ANKLE COMPLETE - 3+ VIEW COMPARISON:  None. FINDINGS: No fracture.  No bone lesion. The ankle joint is normally spaced and aligned. There is significant lateral soft tissue swelling. IMPRESSION: No fracture or dislocation. Electronically Signed   By: Lajean Manes M.D.   On: 12/21/2016 10:51    Procedures Procedures (including critical care time)  Medications Ordered in ED Medications - No data to display   Initial Impression / Assessment and Plan / ED Course  I have reviewed the triage vital signs and the nursing notes.  Pertinent labs & imaging results that were available during my care of the patient were reviewed by me and considered in my medical decision making (see chart for details).     Patient with a history of foot drop presenting with left ankle  pain after a mechanical injury. X-Ray negative for obvious fracture or dislocation. Pain managed in ED. Pt advised to follow up with orthopedics if symptoms persist for possibility of missed fracture diagnosis. Patient given brace while in ED, conservative therapy recommended and discussed. Patient will be  dc home & is agreeable with above plan.  Final Clinical Impressions(s) / ED Diagnoses   Final diagnoses:  Sprain of left ankle, unspecified ligament, initial encounter    New Prescriptions Discharge Medication List as of 12/21/2016 11:27 AM       Joanne Gavel, PA-C 12/21/16 1230    Long, Wonda [employer], MD 12/22/16 [phone]

## 2016-12-21 NOTE — ED Notes (Signed)
Pt in wheelchair. 

## 2016-12-21 NOTE — ED Notes (Signed)
ED Provider at bedside. 

## 2016-12-21 NOTE — Discharge Instructions (Signed)
Please elevate your ankle above the level of your heart when you are resting to help with swelling. Apply ice for 15-20 minutes up to 3-4 times per day for the first 48 hours to help with pain and swelling. After 48 hours, you can apply heat or ice to help with pain. You can take 650 mg of Tylenol every 6 hours for pain control. Do not take more than 4000 mg in a 24 hour period. Use caution with over the counter medications that may also contain medication.   Please wear the ankle brace to help your injury heal. You can wear it inside of your shoe. You can begin to bear weight on the left leg as your pain allows. I have also included some ankle stretches to help strengthen your ankle. You can start these as your pain allows.   If you develop any new or worsening symptoms, including if you reinjure the left foot or ankle, please return to the emergency department for re-evaluation.

## 2016-12-21 NOTE — ED Triage Notes (Signed)
Pt reports yesterday she was coming down some steps and twisted her L ankle. Denies fall. Reports unable to bear weight at this time. SBP in 90s in triage -- reports having dialysis this am and that her BP typically runs low. Denies dizziness.

## 2016-12-21 NOTE — ED Notes (Signed)
Called pt's daughter (Ms. Pettiford) and notified her that pt is ready for discharge -- states she's on her way and will be here in a few minutes.

## 2016-12-21 NOTE — ED Notes (Addendum)
[name]  Will return to pick up pt after visit.

## 2016-12-21 NOTE — ED Notes (Signed)
Attempted to call pt's daughter; no answer. 

## 2016-12-27 ENCOUNTER — Encounter (HOSPITAL_COMMUNITY): Payer: Self-pay | Admitting: *Deleted

## 2016-12-27 ENCOUNTER — Emergency Department (HOSPITAL_COMMUNITY)
Admission: EM | Admit: 2016-12-27 | Discharge: 2016-12-27 | Disposition: A | Discharge status: Home / Self Care | Payer: Medicare Other | Attending: Emergency Medicine | Admitting: Emergency Medicine

## 2016-12-27 DIAGNOSIS — M79672 Pain in left foot: Secondary | ICD-10-CM | POA: Diagnosis present

## 2016-12-27 NOTE — ED Triage Notes (Signed)
Pt reports she is not here for her foot pain, she is here because she needs a "boot" to wear.  She was seen at [place] ED and was given a post-op boot which is not giving her any relief.

## 2016-12-27 NOTE — ED Provider Notes (Signed)
66 year old female presents to [place] along with request of boot.  She was recently evaluated at [facility] with ankle pain.  Patient does not want further evaluation she would just like a boot that provided more comfort for ambulation.  Patient had imaging at [facility] showing no acute fracture and was reportedly placed in a postop shoe.  Patient requesting a walking boot.  She notes no changes in her presentation since evaluation.  Cam walker ordered.   Okey Regal, PA-C 12/27/16 2313    Milton Ferguson, MD 12/28/16 [phone]

## 2016-12-27 NOTE — ED Notes (Signed)
[facility] ortho tech to place cam walker.

## 2017-04-16 ENCOUNTER — Encounter: Payer: Self-pay | Admitting: Podiatry

## 2017-04-16 ENCOUNTER — Ambulatory Visit: Payer: Medicare Other | Admitting: Podiatry

## 2017-04-16 DIAGNOSIS — M21371 Foot drop, right foot: Secondary | ICD-10-CM | POA: Diagnosis not present

## 2017-04-16 DIAGNOSIS — L03031 Cellulitis of right toe: Secondary | ICD-10-CM

## 2017-04-16 DIAGNOSIS — L03032 Cellulitis of left toe: Secondary | ICD-10-CM | POA: Diagnosis not present

## 2017-04-16 DIAGNOSIS — M21372 Foot drop, left foot: Secondary | ICD-10-CM

## 2017-04-16 NOTE — Progress Notes (Signed)
   Subjective:    Patient ID: [name], female    DOB: May 28, 1950, 67 y.o.   MRN: [id number]  HPI    Review of Systems  All other systems reviewed and are negative.      Objective:   Physical Exam        Assessment & Plan:

## 2017-04-16 NOTE — Progress Notes (Signed)
Subjective:   Patient ID: [name], female   DOB: 67 y.o.   MRN: [id number]   HPI Patient presents stating that she is got chronic irritation of the hallux nail both feet of several months duration with crusted tissue and distal drainage and has significant foot drop bilateral and wears braces that are very heavy and she is had for over 7 years.  States that occurred after she had back surgery and patient does not smoke currently and likes to be active   Review of Systems  All other systems reviewed and are negative.       Objective:  Physical Exam  Constitutional: She appears well-developed and well-nourished.  Cardiovascular: Intact distal pulses.  Pulmonary/Chest: Effort normal.  Musculoskeletal: Normal range of motion.  Neurological: She is alert.  Skin: Skin is warm.  Nursing note and vitals reviewed.   Vascular status mildly diminished with pulses intact but weak with patient noted to have diminished sharp dull vibratory.  Medial border of the hallux bilateral his distal redness with slight drainage is localized with no proximal edema erythema or drainage noted and patient has severe foot drop deformity secondary to back surgery with failed result.  Patient has good digital perfusion and well oriented     Assessment:  Chronic paronychia infection hallux bilateral with crusted tissue formation and severe foot drop bilateral     Plan:  H&P discussed the older type brace as he has in a more moderate brace would be of benefit and scheduled her with ped orthotist.  Today I infiltrated each hallux 60 mg like Marcaine mixture using sterile instrumentation I removed the medial borders removed proud flesh and allow channel for drainage.  Gave instructions on soaks and reappoint to recheck

## 2017-04-16 NOTE — Patient Instructions (Signed)

## 2017-04-21 ENCOUNTER — Other Ambulatory Visit: Payer: Medicare Other | Admitting: Orthotics

## 2017-04-22 ENCOUNTER — Ambulatory Visit: Payer: Medicare Other | Admitting: Orthotics

## 2017-04-22 DIAGNOSIS — M21371 Foot drop, right foot: Secondary | ICD-10-CM

## 2017-04-22 DIAGNOSIS — M21372 Foot drop, left foot: Principal | ICD-10-CM

## 2017-04-22 NOTE — Progress Notes (Signed)
Patient was here to be evaluated for b/l braces due to b/l foot drop.   Patient was evaluated for muscle strength, especially dorsiflexers.  She demonstrated good/acceptable ROM/strength in front and transverse planes; however there was significant decrease in both ROM and strength in dorsiflexors, particularly anterior tibialis.    Based upon this assessment, patient would be fit in thermoplastic articulated brace w/ tamarack dorsi assist hinges w/ pos stop.

## 2017-05-28 ENCOUNTER — Ambulatory Visit (INDEPENDENT_AMBULATORY_CARE_PROVIDER_SITE_OTHER): Payer: Medicare Other | Admitting: Orthotics

## 2017-05-28 DIAGNOSIS — M21371 Foot drop, right foot: Secondary | ICD-10-CM | POA: Diagnosis not present

## 2017-05-28 DIAGNOSIS — M21372 Foot drop, left foot: Secondary | ICD-10-CM

## 2017-05-28 NOTE — Progress Notes (Signed)
Patient came in today to p/u bilateral Thermoplastic AFO w/ articulated dorsi assist (tammarack hinges).  This to replace MAFO that she has had for more than 5 years.    Patient was given Laurance Flatten Balance shoes to accommodate brace as her current shoes were too narrow.   The right one needed ppt padding around the medial malleolus; this was done to patient's satisfication.    Patient was able to wear and the AFO's did well in dorsi assist and proper foot clearance was achieved.

## 2017-06-06 ENCOUNTER — Ambulatory Visit: Payer: Medicare Other | Admitting: Family Medicine

## 2017-07-18 ENCOUNTER — Encounter: Payer: Self-pay | Admitting: *Deleted

## 2017-07-18 ENCOUNTER — Encounter: Payer: Self-pay | Admitting: Vascular Surgery

## 2017-07-18 ENCOUNTER — Ambulatory Visit: Payer: Medicare Other | Admitting: Vascular Surgery

## 2017-07-18 VITALS — BP 151/84 | HR 102 | Resp 18 | Ht 65.0 in | Wt 125.5 lb

## 2017-07-18 DIAGNOSIS — N186 End stage renal disease: Secondary | ICD-10-CM | POA: Diagnosis not present

## 2017-07-18 DIAGNOSIS — Z992 Dependence on renal dialysis: Secondary | ICD-10-CM | POA: Diagnosis not present

## 2017-07-18 DIAGNOSIS — M79644 Pain in right finger(s): Secondary | ICD-10-CM | POA: Diagnosis not present

## 2017-07-18 NOTE — Progress Notes (Signed)
Patient ID: [name], female   DOB: 06/09/1950, 67 y.o.   MRN: [id number]  Reason for Consult: Follow-up (eval severe R thumb pain X2 wks )   Referred by Donato Heinz, MD  Subjective:     HPI:  [name] is a 67 y.o. female with history of end-stage renal disease has been on dialysis since the 1990s via right upper extremity access.  Initially she had access that appears to be a radiocephalic fistula failed early on she is ultimately had multiple upper extremity accesses.  She recently underwent stenting of her right upper extremity AV graft and since that time is had right thumb pain and weakness.  States the pain keeps her up at night.  She can feel her thumb although it does feel somewhat numb.  She also has occasional tingling in her index and middle finger but does not have the associated pain.  She states that she has never had a problem like this before.  She is not taking any antiplatelet or blood thinners at this time.  She has not seen anyone else regarding this issue.  Past Medical History:  Diagnosis Date  . Anemia    anemia of chronic disease likely 2/2 ESRD per last anemia panel (02/2011) with Fe 35, TIBC 167, ferritin 2041  // BL Hgb 8-10  . Anuria    Due to dialysis  . Aortic stenosis, mild    03/2011 echo  . Arthritis    Back  . Blood transfusion 1990's   r/t Kidney removal surgery  . Brain aneurysm    No records could be found  . Colitis, ischemic ([place]) 01/2011   S/P partial colectomy of right hemicolon and ileostomy placement  . Diabetes mellitus    Borderline  . Dialysis patient [facility])    [place] Tues,Thursday, saturday  . Dysrhythmia    SVT for brief period in Feb 2013  . End stage renal disease on dialysis [facility])    Secondary to hypertension // T/Th/Sat dialysis on [employer]  . Foot drop, bilateral    after back surgery  . Heart murmur    Born with heart mumur, does not require follow up per pt  . Hepatitis C   .  Hypertension    Does not see a heart doctor, had pre transplant stress test at [facility]    . Lumbar spinal stenosis     bilateral L4-5 and L5-S1 posterior lumbar interbody arthrodesis and bilateral L4-S1 posterior lateral arthrodesis for lumbar stenosis dynamic lumbar spondylolisthesis lumbar spondylosis lumbar radiculopathy with foot drop // s/p surgery 01/2011 - see surgery section for details.  . Seizures ([place])    r/t HTN in 1990's x 1  . Umbilical hernia age 50   Family History  Problem Relation Age of Onset  . Diabetes Father   . Stroke Mother   . COPD Brother   . Heart attack Brother   . Bronchitis Sister   . Anesthesia problems Neg Hx   . Hypotension Neg Hx   . Malignant hyperthermia Neg Hx   . Pseudochol deficiency Neg Hx    Past Surgical History:  Procedure Laterality Date  . APPENDECTOMY  1960's  . AV FISTULA PLACEMENT  04/08/2011   Procedure: INSERTION OF ARTERIOVENOUS (AV) GORE-TEX GRAFT ARM;  Surgeon: Rosetta Posner, MD;  Location: [facility];  Service: Vascular;  Laterality: Right;  Revision of Upper Arm Gore-Tex Graft  . BACK SURGERY  01/30/2011   L4-S1 lumbar laminectomy facetectomy  and foraminotomies for decompression a bilateral L4-5 and L5-S1 posterior lumbar interbody arthrodesis and bilateral L4-S1 posterior lateral arthrodesis for lumbar stenosis dynamic lumbar spondylolisthesis lumbar spondylosis lumbar radiculopathy with foot drop  . COLONOSCOPY  02/19/2011   Procedure: COLONOSCOPY;  Surgeon: Missy Sabins, MD;  Location: [place];  Service: Endoscopy;  Laterality: N/A;  . COLONOSCOPY  03/07/2011   Procedure: COLONOSCOPY;  Surgeon: Cleotis Nipper, MD;  Location: [facility] ENDOSCOPY;  Service: Endoscopy;  Laterality: N/A;  no prep needed--pt has long Hartmann's pouch and ileostomy  . COLOSTOMY  02/09/2011   Reason for surgery: ischemic colitis of right hemicolon requiring partial colectomy and ileostomy.  Surgeon: Harl Bowie, MD;  Location: [place];  Service: General;   Laterality: Right;  . ILEOSTOMY CLOSURE  05/29/2011   Procedure: ILEOSTOMY TAKEDOWN;  Surgeon: Harl Bowie, MD;  Location: [place];  Service: General;  Laterality: N/A;  . INSERTION OF DIALYSIS CATHETER Right 04/15/2016   Procedure: INSERTION OF DIALYSIS CATHETER RIGHT INTERNAL JUGULAR;  Surgeon: Conrad Donovan, MD;  Location: [place];  Service: Vascular;  Laterality: Right;  . LAPAROTOMY  02/09/2011   Procedure: EXPLORATORY LAPAROTOMY;  Surgeon: Harl Bowie, MD;  Location: [place];  Service: General;  Laterality: N/A;  . NEPHRECTOMY  2010   right side done at [facility] on transplant list  . PARTIAL COLECTOMY  02/09/2011   Reason for surgery: ischemic colitis of right hemicolon; Surgeon: Harl Bowie, MD;  Location: [place];  Service: General;  Laterality: Right;  . REVISION OF ARTERIOVENOUS GORETEX GRAFT Right 04/15/2016   Procedure: REVISION OF ARTERIOVENOUS GORETEX GRAFT USING 7mmX20cm GORETEX GRAFT;  Surgeon: Conrad [place], MD;  Location: [place];  Service: Vascular;  Laterality: Right;  . RIB RESECTION     d/t kidney removal  . TONSILLECTOMY     as a child  . total parathyroidectomy  01/17/2001   with autotransplantation into left forearm // due to secondary hyperparathyroidism from ESRD  . UMBILICAL HERNIA REPAIR  age 39- 70  . VASCULAR SURGERY     right arm dialysis graft    Short Social History:  Social History   Tobacco Use  . Smoking status: Current Every Day Smoker    Packs/day: 0.50    Years: 48.00    Pack years: 24.00    Types: Cigarettes  . Smokeless tobacco: Never Used  . Tobacco comment: 1 pk lasts 2-3 days.   Substance Use Topics  . Alcohol use: Yes    Alcohol/week: 1.8 - 2.4 oz    Types: 3 - 4 Shots of liquor per week    Comment: 3-4 shots of liquor on Fridays    Allergies  Allergen Reactions  . Contrast Media [Iodinated Diagnostic Agents]     Seziure  . Iohexol Other (See Comments)    Reaction is convulsions    Current Outpatient Medications    Medication Sig Dispense Refill  . b complex-vitamin c-folic acid (NEPHRO-VITE) 0.8 MG TABS tablet Take 1 tablet by mouth daily.    . calcium carbonate (CALCI-CHEW) 1250 (500 Ca) MG chewable tablet Chew 2 tablets by mouth 3 (three) times daily after meals.    . diphenhydrAMINE (BENADRYL) 25 MG tablet Take 50 mg by mouth daily as needed (cold symptoms).     No current facility-administered medications for this visit.     Review of Systems  Constitutional:  Constitutional negative. HENT: HENT negative.  Eyes: Eyes negative.  Respiratory: Respiratory negative.  GI: Gastrointestinal negative.  Musculoskeletal: Positive for gait problem.  Skin: Skin negative.  Neurological: Positive for numbness.  Hematologic: Hematologic/lymphatic negative.  Psychiatric: Psychiatric negative.        Objective:  Objective   Vitals:   07/18/17 1117 07/18/17 1121  BP: (!) 153/86 (!) 151/84  Pulse: (!) 102   Resp: 18   SpO2: 99%   Weight: 125 lb 8 oz (56.9 kg)   Height: 5\' 5"  (1.651 m)    Body mass index is 20.88 kg/m.  Physical Exam  Constitutional: She appears well-developed.  HENT:  Head: Normocephalic.  Eyes: Pupils are equal, round, and reactive to light.  Neck: Normal range of motion.  Cardiovascular: Normal rate.  Right radial signal is weak and monophasic, there is a strong ulnar signal and palmar arch that diminishes with compression of the graft  Pulmonary/Chest: Effort normal.  Skin: Skin is warm and dry.  No right digital ulceration  Psychiatric: She has a normal mood and affect. Her behavior is normal. Judgment and thought content normal.     Assessment/Plan:     67 year old female follows up after right upper extremity AV graft stenting now has pain in her right thumb that keeps her up at night.  On physical exam she has a dominant flow via the ulnar artery is a strong palmar arch of the radial artery has very minimal flow by Doppler.  She does not appear to have frank  steal this may be neurologic in nature.  We will start with right upper extremity angiogram by accessing the graft to see if blood flow can be improved to the thumb.  We will also refer her to hand surgery for consideration of alternative interventions particularly if improving the blood flow does not help.  I would be very reticent to perform any procedures on her AV graft given the amount time she has been on dialysis and the fact that she does not have any tissue loss from this current issue and it does not appear to be frankly a steal situation.     Waynetta Sandy MD [facility]

## 2017-07-21 ENCOUNTER — Ambulatory Visit (HOSPITAL_COMMUNITY)
Admission: RE | Admit: 2017-07-21 | Discharge: 2017-07-21 | Disposition: A | Discharge status: Home / Self Care | Payer: Medicare Other | Source: Ambulatory Visit | Attending: Vascular Surgery | Admitting: Vascular Surgery

## 2017-07-21 ENCOUNTER — Encounter (HOSPITAL_COMMUNITY)
Admission: RE | Disposition: A | Discharge status: Home / Self Care | Payer: Self-pay | Source: Ambulatory Visit | Attending: Vascular Surgery

## 2017-07-21 ENCOUNTER — Encounter (HOSPITAL_COMMUNITY): Payer: Self-pay | Admitting: Vascular Surgery

## 2017-07-21 ENCOUNTER — Other Ambulatory Visit: Payer: Self-pay | Admitting: *Deleted

## 2017-07-21 DIAGNOSIS — Z8619 Personal history of other infectious and parasitic diseases: Secondary | ICD-10-CM | POA: Insufficient documentation

## 2017-07-21 DIAGNOSIS — M21371 Foot drop, right foot: Secondary | ICD-10-CM | POA: Diagnosis not present

## 2017-07-21 DIAGNOSIS — Z91041 Radiographic dye allergy status: Secondary | ICD-10-CM | POA: Diagnosis not present

## 2017-07-21 DIAGNOSIS — Z992 Dependence on renal dialysis: Secondary | ICD-10-CM | POA: Insufficient documentation

## 2017-07-21 DIAGNOSIS — E1122 Type 2 diabetes mellitus with diabetic chronic kidney disease: Secondary | ICD-10-CM | POA: Insufficient documentation

## 2017-07-21 DIAGNOSIS — I120 Hypertensive chronic kidney disease with stage 5 chronic kidney disease or end stage renal disease: Secondary | ICD-10-CM | POA: Diagnosis not present

## 2017-07-21 DIAGNOSIS — M79641 Pain in right hand: Secondary | ICD-10-CM | POA: Diagnosis present

## 2017-07-21 DIAGNOSIS — Z833 Family history of diabetes mellitus: Secondary | ICD-10-CM | POA: Insufficient documentation

## 2017-07-21 DIAGNOSIS — F17210 Nicotine dependence, cigarettes, uncomplicated: Secondary | ICD-10-CM | POA: Insufficient documentation

## 2017-07-21 DIAGNOSIS — Z823 Family history of stroke: Secondary | ICD-10-CM | POA: Insufficient documentation

## 2017-07-21 DIAGNOSIS — Z8669 Personal history of other diseases of the nervous system and sense organs: Secondary | ICD-10-CM | POA: Diagnosis not present

## 2017-07-21 DIAGNOSIS — Z905 Acquired absence of kidney: Secondary | ICD-10-CM | POA: Diagnosis not present

## 2017-07-21 DIAGNOSIS — Z888 Allergy status to other drugs, medicaments and biological substances status: Secondary | ICD-10-CM | POA: Diagnosis not present

## 2017-07-21 DIAGNOSIS — E890 Postprocedural hypothyroidism: Secondary | ICD-10-CM | POA: Diagnosis not present

## 2017-07-21 DIAGNOSIS — Z9889 Other specified postprocedural states: Secondary | ICD-10-CM | POA: Insufficient documentation

## 2017-07-21 DIAGNOSIS — Z8249 Family history of ischemic heart disease and other diseases of the circulatory system: Secondary | ICD-10-CM | POA: Insufficient documentation

## 2017-07-21 DIAGNOSIS — Z933 Colostomy status: Secondary | ICD-10-CM | POA: Diagnosis not present

## 2017-07-21 DIAGNOSIS — N186 End stage renal disease: Secondary | ICD-10-CM

## 2017-07-21 DIAGNOSIS — T82898A Other specified complication of vascular prosthetic devices, implants and grafts, initial encounter: Secondary | ICD-10-CM | POA: Diagnosis not present

## 2017-07-21 DIAGNOSIS — M21372 Foot drop, left foot: Secondary | ICD-10-CM | POA: Insufficient documentation

## 2017-07-21 HISTORY — PX: UPPER EXTREMITY ANGIOGRAPHY: CATH118270

## 2017-07-21 LAB — POCT I-STAT, CHEM 8
BUN: 25 mg/dL — ABNORMAL HIGH (ref 6–20)
CALCIUM ION: 1.02 mmol/L — AB (ref 1.15–1.40)
CHLORIDE: 102 mmol/L (ref 101–111)
CREATININE: 8.3 mg/dL — AB (ref 0.44–1.00)
GLUCOSE: 76 mg/dL (ref 65–99)
HCT: 33 % — ABNORMAL LOW (ref 36.0–46.0)
Hemoglobin: 11.2 g/dL — ABNORMAL LOW (ref 12.0–15.0)
Potassium: 4 mmol/L (ref 3.5–5.1)
Sodium: 138 mmol/L (ref 135–145)
TCO2: 23 mmol/L (ref 22–32)

## 2017-07-21 SURGERY — UPPER EXTREMITY ANGIOGRAPHY
Anesthesia: LOCAL | Laterality: Right

## 2017-07-21 MED ORDER — METHYLPREDNISOLONE SODIUM SUCC 125 MG IJ SOLR
INTRAMUSCULAR | Status: AC
  Administered 2017-07-21: 62.5 mg via INTRAVENOUS
  Filled 2017-07-21: qty 2

## 2017-07-21 MED ORDER — DIPHENHYDRAMINE HCL 50 MG/ML IJ SOLN
INTRAMUSCULAR | Status: AC
  Administered 2017-07-21: 25 mg via INTRAVENOUS
  Filled 2017-07-21: qty 1

## 2017-07-21 MED ORDER — HEPARIN (PORCINE) IN NACL 1000-0.9 UT/500ML-% IV SOLN
INTRAVENOUS | Status: AC
  Filled 2017-07-21: qty 500

## 2017-07-21 MED ORDER — MIDAZOLAM HCL 2 MG/2ML IJ SOLN
INTRAMUSCULAR | Status: AC
  Filled 2017-07-21: qty 2

## 2017-07-21 MED ORDER — SODIUM CHLORIDE 0.9 % IV SOLN: 250.0000 mL | INTRAVENOUS | Status: DC | PRN

## 2017-07-21 MED ORDER — FENTANYL CITRATE (PF) 100 MCG/2ML IJ SOLN
INTRAMUSCULAR | Status: AC
  Filled 2017-07-21: qty 2

## 2017-07-21 MED ORDER — DIPHENHYDRAMINE HCL 50 MG/ML IJ SOLN
25.0000 mg | INTRAMUSCULAR | Status: AC
  Administered 2017-07-21: 25 mg via INTRAVENOUS

## 2017-07-21 MED ORDER — FENTANYL CITRATE (PF) 100 MCG/2ML IJ SOLN
INTRAMUSCULAR | Status: DC | PRN
  Administered 2017-07-21: 25 ug via INTRAVENOUS

## 2017-07-21 MED ORDER — SODIUM CHLORIDE 0.9% FLUSH: 3.0000 mL | Freq: Two times a day (BID) | INTRAVENOUS | Status: DC

## 2017-07-21 MED ORDER — IODIXANOL 320 MG/ML IV SOLN
INTRAVENOUS | Status: DC | PRN
  Administered 2017-07-21: 15 mL via INTRAVENOUS

## 2017-07-21 MED ORDER — LIDOCAINE HCL (PF) 1 % IJ SOLN
INTRAMUSCULAR | Status: AC
  Filled 2017-07-21: qty 30

## 2017-07-21 MED ORDER — HEPARIN (PORCINE) IN NACL 2-0.9 UNITS/ML
INTRAMUSCULAR | Status: AC | PRN
  Administered 2017-07-21: 500 mL

## 2017-07-21 MED ORDER — LIDOCAINE HCL (PF) 1 % IJ SOLN
INTRAMUSCULAR | Status: DC | PRN
  Administered 2017-07-21: 5 mL via INTRADERMAL

## 2017-07-21 MED ORDER — SODIUM CHLORIDE 0.9% FLUSH: 3.0000 mL | INTRAVENOUS | Status: DC | PRN

## 2017-07-21 MED ORDER — SODIUM CHLORIDE 0.9 % IV SOLN
INTRAVENOUS | Status: AC | PRN
  Administered 2017-07-21: 10 mL/h via INTRAVENOUS

## 2017-07-21 MED ORDER — FAMOTIDINE IN NACL 20-0.9 MG/50ML-% IV SOLN
INTRAVENOUS | Status: AC
  Administered 2017-07-21: 20 mg via INTRAVENOUS
  Filled 2017-07-21: qty 50

## 2017-07-21 MED ORDER — METHYLPREDNISOLONE SODIUM SUCC 125 MG IJ SOLR
125.0000 mg | INTRAMUSCULAR | Status: AC
  Administered 2017-07-21: 62.5 mg via INTRAVENOUS

## 2017-07-21 MED ORDER — FAMOTIDINE IN NACL 20-0.9 MG/50ML-% IV SOLN
20.0000 mg | INTRAVENOUS | Status: AC
  Administered 2017-07-21: 20 mg via INTRAVENOUS

## 2017-07-21 MED ORDER — MIDAZOLAM HCL 2 MG/2ML IJ SOLN
INTRAMUSCULAR | Status: DC | PRN
  Administered 2017-07-21: 0.5 mg via INTRAVENOUS

## 2017-07-21 SURGICAL SUPPLY — 9 items
COVER PRB 48X5XTLSCP FOLD TPE (BAG) IMPLANT
COVER PROBE 5X48 (BAG) ×2
KIT MICROPUNCTURE NIT STIFF (SHEATH) ×1 IMPLANT
KIT PV (KITS) ×2 IMPLANT
STOPCOCK MORSE 400PSI 3WAY (MISCELLANEOUS) ×1 IMPLANT
TRANSDUCER W/STOPCOCK (MISCELLANEOUS) ×2 IMPLANT
TRAY PV CATH (CUSTOM PROCEDURE TRAY) ×2 IMPLANT
TUBING CIL FLEX 10 FLL-RA (TUBING) ×1 IMPLANT
WIRE TORQFLEX AUST .018X40CM (WIRE) ×2 IMPLANT

## 2017-07-21 NOTE — Discharge Instructions (Signed)
**Note -identified via Obfuscation** Fistulogram, Care After °Refer to this sheet in the next few weeks. These instructions provide you with information on caring for yourself after your procedure. Your health care provider may also give you more specific instructions. Your treatment has been planned according to current medical practices, but problems sometimes occur. Call your health care provider if you have any problems or questions after your procedure. °What can I expect after the procedure? °After your procedure, it is typical to have the following: °· A small amount of discomfort in the area where the catheters were placed. °· A small amount of bruising around the fistula. °· Sleepiness and fatigue. ° °Follow these instructions at home: °· Rest at home for the day following your procedure. °· Do not drive or operate heavy machinery while taking pain medicine. °· Take medicines only as directed by your health care provider. °· Do not take baths, swim, or use a hot tub until your health care provider approves. You may shower 24 hours after the procedure or as directed by your health care provider. °· There are many different ways to close and cover an incision, including stitches, skin glue, and adhesive strips. Follow your health care provider's instructions on: °? Incision care. °? Bandage (dressing) changes and removal. °? Incision closure removal. °· Monitor your dialysis fistula carefully. °Contact a health care provider if: °· You have drainage, redness, swelling, or pain at your catheter site. °· You have a fever. °· You have chills. °Get help right away if: °· You feel weak. °· You have trouble balancing. °· You have trouble moving your arms or legs. °· You have problems with your speech or vision. °· You can no longer feel a vibration or buzz when you put your fingers over your dialysis fistula. °· The limb that was used for the procedure: °? Swells. °? Is painful. °? Is cold. °? Is discolored, such as blue or pale white. °This  information is not intended to replace advice given to you by your health care provider. Make sure you discuss any questions you have with your health care provider. °Document Released: 07/19/2013 Document Revised: 08/10/2015 Document Reviewed: 04/23/2013 °Elsevier Interactive Patient Education © 2018 [employer] ° ° °Moderate Conscious Sedation, Adult, Care After °These instructions provide you with information about caring for yourself after your procedure. Your health care provider may also give you more specific instructions. Your treatment has been planned according to current medical practices, but problems sometimes occur. Call your health care provider if you have any problems or questions after your procedure. °What can I expect after the procedure? °After your procedure, it is common: °· To feel sleepy for several hours. °· To feel clumsy and have poor balance for several hours. °· To have poor judgment for several hours. °· To vomit if you eat too soon. ° °Follow these instructions at home: °For at least 24 hours after the procedure: ° °· Do not: °? Participate in activities where you could fall or become injured. °? Drive. °? Use heavy machinery. °? Drink alcohol. °? Take sleeping pills or medicines that cause drowsiness. °? Make important decisions or sign legal documents. °? Take care of children on your own. °· Rest. °Eating and drinking °· Follow the diet recommended by your health care provider. °· If you vomit: °? Drink water, juice, or soup when you can drink without vomiting. °? Make sure you have little or no nausea before eating solid foods. °General instructions °· Have a responsible adult stay  **Note -identified via Obfuscation** with you until you are awake and alert. °· Take over-the-counter and prescription medicines only as told by your health care provider. °· If you smoke, do not smoke without supervision. °· Keep all follow-up visits as told by your health care provider. This is important. °Contact a health care  provider if: °· You keep feeling nauseous or you keep vomiting. °· You feel light-headed. °· You develop a rash. °· You have a fever. °Get help right away if: °· You have trouble breathing. °This information is not intended to replace advice given to you by your health care provider. Make sure you discuss any questions you have with your health care provider. °Document Released: 12/23/2012 Document Revised: 08/07/2015 Document Reviewed: 06/24/2015 °Elsevier Interactive Patient Education © 2018 Elsevier Inc. ° °

## 2017-07-21 NOTE — Op Note (Signed)
    Patient name: PAULENE TAYAG MRN: [id number] DOB: 1950/12/01 Sex: female  07/21/2017 Pre-operative Diagnosis: Pain in right hand, end-stage renal disease Post-operative diagnosis:  Same Surgeon:  Eda Paschal. Donzetta Matters, MD Procedure Performed: 1.  Ultrasound guided cannulation right arm AV graft 2.  Right upper extremity angiogram 3.  Right upper extremity fistulogram 4.  Moderate sedation with fentanyl and Versed for 90 minutes  Indications: 67 year old female with end-stage renal disease on dialysis via right upper extremity graft that is been placed for many years.  She is undergone many interventions on this including recent stenting of the outflow.  She now has right thumb pain does not have a palpable radial artery and is indicated for angiogram.  Findings: The graft itself is patent but has some calcification making cannulation mildly difficult even with ultrasound guidance.  The anastomosis is patent and the dominant flow to the hand is the ulnar artery which is without disease.  It does fill the arch which eventually gives rise to thenar eminence branches but there is no discernible radial artery either proximally or distally.  The graft is patent with a stent in the outflow there is some retrograde flow into the collaterals at the level of the IJ but there is a palpable thrill no evidence of graft malfunction.   Procedure:  The patient was identified in the holding area and taken to room 8.  The patient was then placed supine on the table and prepped and draped in the usual sterile fashion.  A time out was called.  We used ultrasound to identify the graft which was noted to be patent and an image was saved to the chart.  We then cannulated this with a micropuncture needle in a retrograde fashion with direct visualization.  A wire was placed down to the brachial artery and a micropuncture sheath was placed and we performed I did right upper extremity angiogram with ulnar artery is the only  runoff and no possibilities for intervention were identified.  We then pulled the sheath back into the graft and performed right upper extremity fistulogram.  Again no intervention was undertaken.  She did tolerate procedure without immediate complication.  She will need evaluation by hand surgery as there is no further intervention that can be done to improve the blood flow other than ligation of the graft which would require new access.  I will see her back in the office to discuss this.  Contrast: 15cc  Kasyn Stouffer C. Donzetta Matters, MD [facility] [place] Office: [phone] Pager: [phone]

## 2017-07-21 NOTE — H&P (Signed)
   History and Physical Update  The patient was interviewed and re-examined.  The patient's previous History and Physical has been reviewed and is unchanged from recent office visit.  We will plan right upper extremity angiogram to evaluate arterial flow to the hand.  She will be premedicated prior to this for previous contrast allergy.  Jencarlos Nicolson C. Donzetta Matters, MD [facility] [place] Office: [phone] Pager: [phone]   07/21/2017, 9:33 AM

## 2017-07-23 ENCOUNTER — Telehealth: Payer: Self-pay | Admitting: Vascular Surgery

## 2017-07-23 NOTE — Telephone Encounter (Signed)
-----   Message from Mena Goes, RN sent at 07/21/2017 12:09 PM EDT ----- Regarding: 4 weeks to disuss angiogram   ----- Message ----- From: Waynetta Sandy, MD Sent: 07/21/2017  11:40 AM To: [address]  [name] [id number] [birth year]   07/21/2017 Pre-operative Diagnosis: Pain in right hand, end-stage renal disease  Surgeon:  Eda Paschal. Donzetta Matters, MD  Procedure Performed: 1.  Ultrasound guided cannulation right arm AV graft 2.  Right upper extremity angiogram 3.  Right upper extremity fistulogram 4.  Moderate sedation with fentanyl and Versed for 90 minutes  F/u in office in 4 weeks

## 2017-07-23 NOTE — Telephone Encounter (Signed)
Sched appt 08/22/17 at 3:30. Spoke to pt to inform them of appt.

## 2017-08-22 ENCOUNTER — Ambulatory Visit (INDEPENDENT_AMBULATORY_CARE_PROVIDER_SITE_OTHER): Payer: Medicare Other | Admitting: Vascular Surgery

## 2017-08-22 ENCOUNTER — Other Ambulatory Visit: Payer: Self-pay

## 2017-08-22 ENCOUNTER — Encounter: Payer: Self-pay | Admitting: Vascular Surgery

## 2017-08-22 VITALS — BP 142/92 | HR 109 | Temp 98.6°F | Resp 20 | Ht 65.0 in | Wt 123.0 lb

## 2017-08-22 DIAGNOSIS — M79644 Pain in right finger(s): Secondary | ICD-10-CM | POA: Diagnosis not present

## 2017-08-22 DIAGNOSIS — N186 End stage renal disease: Secondary | ICD-10-CM | POA: Diagnosis not present

## 2017-08-22 DIAGNOSIS — Z992 Dependence on renal dialysis: Secondary | ICD-10-CM | POA: Diagnosis not present

## 2017-08-22 NOTE — Progress Notes (Signed)
  Subjective:     Patient ID: [name], female   DOB: 13-Dec-1950, 67 y.o.   MRN: [id number]  HPI 67-year-old female recently underwent right upper extremity angiogram and fistulogram.  She had had a outflow stent from her right upper extremity AV graft.  She is having some pain and numbness which keeping her up at night.  She is now scheduled for carpal tunnel release.  She does not have any tissue loss or ulceration.   Review of Systems Right thumb numbness and burning pain with associated weakness.    Objective:   Physical Exam Awake alert and oriented Nonlabored respirations Right upper arm with palpable thrill Palpable ulnar pulse at right wrist    Assessment/plan     67 year old female status post right upper extremity angiogram and shuntogram.  Flow to the hand is via ulnar artery which fills her arch but not much actually feels her thumb.  Her symptoms are also consistent with median nerve compression and she is to undergo carpal tunnel release in the very near future.  If this does not fix her symptoms we may need to consider moving her access to her dominant left hand with a temporary catheter in the interim and ligating her right upper extremity graft.  She is not thrilled about this consideration but will wait until after her carpal tunnel release.  She can follow-up as needed if she continues to have pain and desires the above procedure.  Brandon C. Donzetta Matters, MD [facility] [place] Office: [phone] Pager: [phone]

## 2018-02-03 ENCOUNTER — Encounter (INDEPENDENT_AMBULATORY_CARE_PROVIDER_SITE_OTHER): Payer: Self-pay

## 2018-02-11 ENCOUNTER — Other Ambulatory Visit (INDEPENDENT_AMBULATORY_CARE_PROVIDER_SITE_OTHER): Payer: Self-pay | Admitting: Vascular Surgery

## 2018-02-15 MED ORDER — CEFAZOLIN SODIUM-DEXTROSE 1-4 GM/50ML-% IV SOLN: 1.0000 g | Freq: Once | INTRAVENOUS | Status: DC

## 2018-02-16 ENCOUNTER — Ambulatory Visit: Admission: RE | Admit: 2018-02-16 | Payer: Medicare Other | Source: Ambulatory Visit | Admitting: Vascular Surgery

## 2018-02-16 ENCOUNTER — Encounter: Admission: RE | Payer: Self-pay | Source: Ambulatory Visit

## 2018-02-16 SURGERY — A/V SHUNTOGRAM
Anesthesia: Moderate Sedation | Laterality: Right

## 2018-03-02 ENCOUNTER — Encounter (HOSPITAL_BASED_OUTPATIENT_CLINIC_OR_DEPARTMENT_OTHER): Payer: Self-pay | Admitting: *Deleted

## 2018-03-02 ENCOUNTER — Emergency Department (HOSPITAL_BASED_OUTPATIENT_CLINIC_OR_DEPARTMENT_OTHER)
Admission: EM | Admit: 2018-03-02 | Discharge: 2018-03-02 | Disposition: A | Discharge status: Home / Self Care | Payer: Medicare Other | Attending: Emergency Medicine | Admitting: Emergency Medicine

## 2018-03-02 ENCOUNTER — Other Ambulatory Visit: Payer: Self-pay

## 2018-03-02 ENCOUNTER — Emergency Department (HOSPITAL_BASED_OUTPATIENT_CLINIC_OR_DEPARTMENT_OTHER): Payer: Medicare Other

## 2018-03-02 DIAGNOSIS — I120 Hypertensive chronic kidney disease with stage 5 chronic kidney disease or end stage renal disease: Secondary | ICD-10-CM | POA: Diagnosis not present

## 2018-03-02 DIAGNOSIS — M25512 Pain in left shoulder: Secondary | ICD-10-CM | POA: Diagnosis present

## 2018-03-02 DIAGNOSIS — Z79899 Other long term (current) drug therapy: Secondary | ICD-10-CM | POA: Diagnosis not present

## 2018-03-02 DIAGNOSIS — N186 End stage renal disease: Secondary | ICD-10-CM | POA: Diagnosis not present

## 2018-03-02 DIAGNOSIS — E1122 Type 2 diabetes mellitus with diabetic chronic kidney disease: Secondary | ICD-10-CM | POA: Insufficient documentation

## 2018-03-02 DIAGNOSIS — Z9181 History of falling: Secondary | ICD-10-CM | POA: Diagnosis not present

## 2018-03-02 DIAGNOSIS — F17210 Nicotine dependence, cigarettes, uncomplicated: Secondary | ICD-10-CM | POA: Diagnosis not present

## 2018-03-02 DIAGNOSIS — Z992 Dependence on renal dialysis: Secondary | ICD-10-CM | POA: Insufficient documentation

## 2018-03-02 NOTE — ED Provider Notes (Signed)
[facility] EMERGENCY DEPARTMENT Provider Note   CSN: [id number] Arrival date & time: 03/02/18  1059     History   Chief Complaint Chief Complaint  Patient presents with  . Shoulder Pain    HPI [name] is a 67 y.o. female.  The history is provided by the patient.  Shoulder Pain   This is a new problem. The current episode started more than 1 week ago. The problem occurs daily. The problem has not changed since onset.The pain is present in the left arm. The quality of the pain is described as dull. The pain is at a severity of 2/10. The pain is mild. Associated symptoms include limited range of motion. Pertinent negatives include no numbness, no stiffness, no tingling and no itching. She has tried nothing for the symptoms. The treatment provided no relief. There has been a history of trauma.    Past Medical History:  Diagnosis Date  . Anemia    anemia of chronic disease likely 2/2 ESRD per last anemia panel (02/2011) with Fe 35, TIBC 167, ferritin 2041  // BL Hgb 8-10  . Anuria    Due to dialysis  . Aortic stenosis, mild    03/2011 echo  . Arthritis    Back  . Blood transfusion 1990's   r/t Kidney removal surgery  . Brain aneurysm    No records could be found  . Colitis, ischemic ([place]) 01/2011   S/P partial colectomy of right hemicolon and ileostomy placement  . Diabetes mellitus    Borderline  . Dialysis patient [facility])    [place] Tues,Thursday, saturday  . Dysrhythmia    SVT for brief period in Feb 2013  . End stage renal disease on dialysis [facility])    Secondary to hypertension // T/Th/Sat dialysis on [employer]  . Foot drop, bilateral    after back surgery  . Heart murmur    Born with heart mumur, does not require follow up per pt  . Hepatitis C   . Hypertension    Does not see a heart doctor, had pre transplant stress test at [facility]    . Lumbar spinal stenosis     bilateral L4-5 and L5-S1 posterior lumbar interbody arthrodesis  and bilateral L4-S1 posterior lateral arthrodesis for lumbar stenosis dynamic lumbar spondylolisthesis lumbar spondylosis lumbar radiculopathy with foot drop // s/p surgery 01/2011 - see surgery section for details.  . Seizures ([place])    r/t HTN in 1990's x 1  . Umbilical hernia age 32    Patient Active Problem List   Diagnosis Date Noted  . Muscle weakness (generalized) 05/15/2012  . Abnormality of gait 05/15/2012  . Other acquired deformity of ankle and foot(736.79) 05/15/2012  . Lumbar radiculitis 01/17/2012  . Post herpetic neuralgia 11/25/2011  . Leg pain, right 10/14/2011  . Valtrex overdose 10/01/2011  . Shingles rash 09/30/2011  . Preop cardiovascular exam 05/28/2011  . Sinus tachycardia 04/23/2011  . Aortic stenosis 04/23/2011  . Ileostomy in place [facility]) 04/22/2011  . Anemia of chronic kidney failure 02/09/2011  . Acute ischemic colitis ([place]) 02/09/2011  . Lumbar stenosis with neurogenic claudication 01/30/2011  . Spondylolisthesis of lumbar region 01/30/2011  . End stage renal disease on dialysis ([place]) 01/30/2011  . Hypertension 01/30/2011  . Unspecified viral hepatitis C without hepatic coma 01/30/2011    Past Surgical History:  Procedure Laterality Date  . APPENDECTOMY  1960's  . AV FISTULA PLACEMENT  04/08/2011   Procedure: INSERTION OF ARTERIOVENOUS (AV)  GORE-TEX GRAFT ARM;  Surgeon: Rosetta Posner, MD;  Location: [facility] OR;  Service: Vascular;  Laterality: Right;  Revision of Upper Arm Gore-Tex Graft  . BACK SURGERY  01/30/2011   L4-S1 lumbar laminectomy facetectomy and foraminotomies for decompression a bilateral L4-5 and L5-S1 posterior lumbar interbody arthrodesis and bilateral L4-S1 posterior lateral arthrodesis for lumbar stenosis dynamic lumbar spondylolisthesis lumbar spondylosis lumbar radiculopathy with foot drop  . COLONOSCOPY  02/19/2011   Procedure: COLONOSCOPY;  Surgeon: Missy Sabins, MD;  Location: [place];  Service: Endoscopy;  Laterality: N/A;  .  COLONOSCOPY  03/07/2011   Procedure: COLONOSCOPY;  Surgeon: Cleotis Nipper, MD;  Location: [facility] ENDOSCOPY;  Service: Endoscopy;  Laterality: N/A;  no prep needed--pt has long Hartmann's pouch and ileostomy  . COLOSTOMY  02/09/2011   Reason for surgery: ischemic colitis of right hemicolon requiring partial colectomy and ileostomy.  Surgeon: Harl Bowie, MD;  Location: [place];  Service: General;  Laterality: Right;  . ILEOSTOMY CLOSURE  05/29/2011   Procedure: ILEOSTOMY TAKEDOWN;  Surgeon: Harl Bowie, MD;  Location: [place];  Service: General;  Laterality: N/A;  . INSERTION OF DIALYSIS CATHETER Right 04/15/2016   Procedure: INSERTION OF DIALYSIS CATHETER RIGHT INTERNAL JUGULAR;  Surgeon: Conrad Oyens, MD;  Location: [place];  Service: Vascular;  Laterality: Right;  . LAPAROTOMY  02/09/2011   Procedure: EXPLORATORY LAPAROTOMY;  Surgeon: Harl Bowie, MD;  Location: [place];  Service: General;  Laterality: N/A;  . NEPHRECTOMY  2010   right side done at [facility] on transplant list  . PARTIAL COLECTOMY  02/09/2011   Reason for surgery: ischemic colitis of right hemicolon; Surgeon: Harl Bowie, MD;  Location: [place];  Service: General;  Laterality: Right;  . REVISION OF ARTERIOVENOUS GORETEX GRAFT Right 04/15/2016   Procedure: REVISION OF ARTERIOVENOUS GORETEX GRAFT USING 23mmX20cm GORETEX GRAFT;  Surgeon: Conrad Monsey, MD;  Location: [place];  Service: Vascular;  Laterality: Right;  . RIB RESECTION     d/t kidney removal  . TONSILLECTOMY     as a child  . total parathyroidectomy  01/17/2001   with autotransplantation into left forearm // due to secondary hyperparathyroidism from ESRD  . UMBILICAL HERNIA REPAIR  age 57- 37  . UPPER EXTREMITY ANGIOGRAPHY Right 07/21/2017   Procedure: UPPER EXTREMITY ANGIOGRAPHY;  Surgeon: Waynetta Sandy, MD;  Location: [place] CV LAB;  Service: Cardiovascular;  Laterality: Right;  [facility] VASCULAR SURGERY     right arm dialysis graft     OB  History   No obstetric history on file.      Home Medications    Prior to Admission medications   Medication Sig Start Date End Date Taking? Authorizing Provider  amLODipine (NORVASC) 5 MG tablet Take 5 mg by mouth every evening.    [provider]  b complex-vitamin c-folic acid (NEPHRO-VITE) 0.8 MG TABS tablet Take 1 tablet by mouth daily.    [provider]  calcium carbonate (CALCI-CHEW) 1250 (500 Ca) MG chewable tablet Chew 3 tablets by mouth 3 (three) times daily after meals.     [provider]  Chlorpheniramine-DM (CORICIDIN HBP COUGH/COLD PO) Take 2 tablets by mouth 3 (three) times daily as needed (cold symptoms).    [provider]  diphenhydrAMINE (BENADRYL) 25 MG tablet Take 50 mg by mouth daily as needed (cold symptoms).    [provider]  TRAVATAN Z 0.004 % SOLN ophthalmic solution Place 1 drop into both eyes at  bedtime. 07/11/17   [provider]    Family History Family History  Problem Relation Age of Onset  . Diabetes Father   . Stroke Mother   . COPD Brother   . Heart attack Brother   . Bronchitis Sister   . Anesthesia problems Neg Hx   . Hypotension Neg Hx   . Malignant hyperthermia Neg Hx   . Pseudochol deficiency Neg Hx     Social History Social History   Tobacco Use  . Smoking status: Current Every Day Smoker    Packs/day: 0.50    Years: 48.00    Pack years: 24.00    Types: Cigarettes  . Smokeless tobacco: Never Used  . Tobacco comment: 6 cigarettes per day  Substance Use Topics  . Alcohol use: Yes    Alcohol/week: 3.0 - 4.0 standard drinks    Types: 3 - 4 Shots of liquor per week    Comment: 3-4 shots of liquor on Fridays  . Drug use: No     Allergies   Contrast media [iodinated diagnostic agents] and Iohexol   Review of Systems Review of Systems  Constitutional: Negative for chills and fever.  HENT: Negative for ear pain and sore throat.   Eyes: Negative for pain and visual  disturbance.  Respiratory: Negative for cough and shortness of breath.   Cardiovascular: Negative for chest pain and palpitations.  Gastrointestinal: Negative for abdominal pain and vomiting.  Genitourinary: Negative for dysuria and hematuria.  Musculoskeletal: Positive for arthralgias (left shoulder). Negative for back pain and stiffness.  Skin: Negative for color change, itching and rash.  Neurological: Negative for tingling, seizures, syncope and numbness.  All other systems reviewed and are negative.    Physical Exam Updated Vital Signs  ED Triage Vitals  Enc Vitals Group     BP 03/02/18 1138 (!) 151/92     Pulse Rate 03/02/18 1138 (!) 58     Resp 03/02/18 1138 20     Temp 03/02/18 1138 98.5 F (36.9 C)     Temp Source 03/02/18 1138 Oral     SpO2 03/02/18 1138 95 %     Weight 03/02/18 1136 120 lb (54.4 kg)     Height 03/02/18 1136 5\' 5"  (1.651 m)     Head Circumference --      Peak Flow --      Pain Score 03/02/18 1136 8     Pain Loc --      Pain Edu? --      Excl. in [place]? --     Physical Exam Vitals signs and nursing note reviewed.  Constitutional:      General: She is not in acute distress.    Appearance: She is well-developed.  HENT:     Head: Normocephalic and atraumatic.     Nose: Nose normal.     Mouth/Throat:     Mouth: Mucous membranes are moist.  Eyes:     Extraocular Movements: Extraocular movements intact.     Conjunctiva/sclera: Conjunctivae normal.     Pupils: Pupils are equal, round, and reactive to light.  Neck:     Musculoskeletal: Normal range of motion and neck supple.  Cardiovascular:     Rate and Rhythm: Normal rate and regular rhythm.     Pulses: Normal pulses.     Heart sounds: Normal heart sounds. No murmur.  Pulmonary:     Effort: Pulmonary effort is normal. No respiratory distress.     Breath sounds: Normal breath sounds.  Abdominal:     Palpations: Abdomen is soft.     Tenderness: There is no abdominal tenderness.    Musculoskeletal:        General: Tenderness (left shoulder) present. No deformity.     Comments: Decreased range of motion secondary to pain  Skin:    General: Skin is warm and dry.  Neurological:     General: No focal deficit present.     Mental Status: She is alert.     Comments: Difficult to assess strength in left upper extremity secondary to pain but appears grossly intact, normal sensation in the left upper extremity      ED Treatments / Results  Labs (all labs ordered are listed, but only abnormal results are displayed) Labs Reviewed - No data to display  EKG EKG Interpretation  Date/Time:  Monday March 02 2018 11:48:37 EST Ventricular Rate:  116 PR Interval:  172 QRS Duration: 96 QT Interval:  342 QTC Calculation: 475 R Axis:   40 Text Interpretation:  Sinus tachycardia Incomplete right bundle branch block Nonspecific ST and T wave abnormality Abnormal ECG Since last tracing rate faster Confirmed by Isla Pence [phone]) on 03/02/2018 11:59:38 AM   Radiology Dg Shoulder Left  Result Date: 03/02/2018 CLINICAL DATA:  68 y/o F; fall 3 weeks ago with left shoulder injury. Lateral pain and decreased range of motion. EXAM: LEFT SHOULDER - 2+ VIEW COMPARISON:  None. FINDINGS: There is no evidence of fracture or dislocation. Mild osteoarthrosis of the acromioclavicular joint with productive changes. Small subacromial enthesophyte. IMPRESSION: No acute fracture or dislocation identified. Electronically Signed   By: Kristine Garbe M.D.   On: 03/02/2018 16:05    Procedures Procedures (including critical care time)  Medications Ordered in ED Medications - No data to display   Initial Impression / Assessment and Plan / ED Course  I have reviewed the triage vital signs and the nursing notes.  Pertinent labs & imaging results that were available during my care of the patient were reviewed by me and considered in my medical decision making (see chart for  details).     [name] is a 67 year old female with history of hypertension who presents to the ED with left shoulder pain after a fall last week.  Patient with pain and difficulty with moving her left shoulder since tripping over her dog last week.  Patient is neurovascularly intact on exam.  Concern for possible rotator cuff injury.  X-ray showed no acute fracture.  Patient has grossly normal strength and sensation.  Will place in a sling for comfort.  Recommend Tylenol and Motrin for pain.  Given information to follow-up with primary care doctor.  May need physical therapy, MRI.  Discharged from ED in good condition.  Given return precautions.  This chart was dictated using voice recognition software.  Despite best efforts to proofread,  errors can occur which can change the documentation meaning.    Final Clinical Impressions(s) / ED Diagnoses   Final diagnoses:  Acute pain of left shoulder    ED Discharge Orders    None       Lennice Sites, DO 03/02/18 1626

## 2018-03-02 NOTE — ED Notes (Signed)
Noted increased HR on re-assessment. Pt denies any changes to symptoms or new symptoms since initial triage.

## 2018-03-02 NOTE — ED Triage Notes (Signed)
She fell 3 weeks ago. Pain in her left shoulder since. States it feels like arthritis. Hx back surgery.

## 2018-03-02 NOTE — ED Triage Notes (Signed)
Pt changed mind and choose not to do aromatherapy.

## 2018-03-02 NOTE — ED Notes (Signed)
Aromatherapy for pain study initiated at this time.

## 2018-03-02 NOTE — Discharge Instructions (Addendum)
Use Tylenol and Motrin for pain.  Use sling for comfort.  Follow-up with primary care doctor or orthopedics above for further care.  There was no acute fracture on your x-ray today.

## 2018-07-08 ENCOUNTER — Ambulatory Visit
Admission: RE | Admit: 2018-07-08 | Discharge: 2018-07-08 | Disposition: A | Discharge status: Home / Self Care | Payer: Medicare Other | Source: Ambulatory Visit | Attending: Nephrology | Admitting: Nephrology

## 2018-07-08 ENCOUNTER — Other Ambulatory Visit: Payer: Self-pay

## 2018-07-08 ENCOUNTER — Other Ambulatory Visit: Payer: Self-pay | Admitting: Nephrology

## 2018-07-08 DIAGNOSIS — R059 Cough, unspecified: Secondary | ICD-10-CM

## 2018-07-08 DIAGNOSIS — R05 Cough: Secondary | ICD-10-CM

## 2018-11-16 DIAGNOSIS — M069 Rheumatoid arthritis, unspecified: Secondary | ICD-10-CM | POA: Insufficient documentation

## 2018-12-04 ENCOUNTER — Ambulatory Visit
Admission: RE | Admit: 2018-12-04 | Discharge: 2018-12-04 | Disposition: A | Discharge status: Home / Self Care | Payer: Medicare Other | Source: Ambulatory Visit | Attending: Nephrology | Admitting: Nephrology

## 2018-12-04 ENCOUNTER — Other Ambulatory Visit: Payer: Self-pay | Admitting: Nephrology

## 2018-12-04 DIAGNOSIS — N186 End stage renal disease: Secondary | ICD-10-CM

## 2019-01-17 DIAGNOSIS — T829XXA Unspecified complication of cardiac and vascular prosthetic device, implant and graft, initial encounter: Secondary | ICD-10-CM | POA: Insufficient documentation

## 2019-01-17 NOTE — Progress Notes (Signed)
MRN : [id number]  [name] is a 68 y.o. (27-Aug-1950) female who presents with chief complaint of No chief complaint on file. Marland Kitchen  History of Present Illness:  The patient returns to the office for follow up regarding problem with the dialysis access. Currently the patient is maintained via a right upper arm AV graft.  The patient has had multiple failed upper extremity accesses.  The patient notes a significant increase in bleeding time after decannulation.  The patient has also been informed that there is increased recirculation.    The patient denies hand pain or other symptoms consistent with steal phenomena.  No significant arm swelling.  The patient denies redness or swelling at the access site. The patient denies fever or chills at home or while on dialysis.  The patient denies amaurosis fugax or recent TIA symptoms. There are no recent neurological changes noted. The patient denies claudication symptoms or rest pain symptoms. The patient denies history of DVT, PE or superficial thrombophlebitis. The patient denies recent episodes of angina or shortness of breath.   Duplex ultrasound of the AV access shows a patent access.  There is a >70% stenosis near the venous anastomosis, previously placed venous anastomosis stent is noted   No outpatient medications have been marked as taking for the 01/18/19 encounter (Appointment) with Delana Meyer, Dolores Lory, MD.    Past Medical History:  Diagnosis Date  . Anemia    anemia of chronic disease likely 2/2 ESRD per last anemia panel (02/2011) with Fe 35, TIBC 167, ferritin 2041  // BL Hgb 8-10  . Anuria    Due to dialysis  . Aortic stenosis, mild    03/2011 echo  . Arthritis    Back  . Blood transfusion 1990's   r/t Kidney removal surgery  . Brain aneurysm    No records could be found  . Colitis, ischemic ([place]) 01/2011   S/P partial colectomy of right hemicolon and ileostomy placement  . Diabetes mellitus    Borderline  .  Dialysis patient [facility])    [place] Tues,Thursday, saturday  . Dysrhythmia    SVT for brief period in Feb 2013  . End stage renal disease on dialysis [facility])    Secondary to hypertension // T/Th/Sat dialysis on [employer]  . Foot drop, bilateral    after back surgery  . Heart murmur    Born with heart mumur, does not require follow up per pt  . Hepatitis C   . Hypertension    Does not see a heart doctor, had pre transplant stress test at [facility]    . Lumbar spinal stenosis     bilateral L4-5 and L5-S1 posterior lumbar interbody arthrodesis and bilateral L4-S1 posterior lateral arthrodesis for lumbar stenosis dynamic lumbar spondylolisthesis lumbar spondylosis lumbar radiculopathy with foot drop // s/p surgery 01/2011 - see surgery section for details.  . Seizures ([place])    r/t HTN in 1990's x 1  . Umbilical hernia age 71    Past Surgical History:  Procedure Laterality Date  . APPENDECTOMY  1960's  . AV FISTULA PLACEMENT  04/08/2011   Procedure: INSERTION OF ARTERIOVENOUS (AV) GORE-TEX GRAFT ARM;  Surgeon: Rosetta Posner, MD;  Location: [facility] OR;  Service: Vascular;  Laterality: Right;  Revision of Upper Arm Gore-Tex Graft  . BACK SURGERY  01/30/2011   L4-S1 lumbar laminectomy facetectomy and foraminotomies for decompression a bilateral L4-5 and L5-S1 posterior lumbar interbody arthrodesis and bilateral L4-S1 posterior lateral arthrodesis for  lumbar stenosis dynamic lumbar spondylolisthesis lumbar spondylosis lumbar radiculopathy with foot drop  . COLONOSCOPY  02/19/2011   Procedure: COLONOSCOPY;  Surgeon: Missy Sabins, MD;  Location: [place];  Service: Endoscopy;  Laterality: N/A;  . COLONOSCOPY  03/07/2011   Procedure: COLONOSCOPY;  Surgeon: Cleotis Nipper, MD;  Location: [facility] ENDOSCOPY;  Service: Endoscopy;  Laterality: N/A;  no prep needed--pt has long Hartmann's pouch and ileostomy  . COLOSTOMY  02/09/2011   Reason for surgery: ischemic colitis of right hemicolon requiring  partial colectomy and ileostomy.  Surgeon: Harl Bowie, MD;  Location: [place];  Service: General;  Laterality: Right;  . ILEOSTOMY CLOSURE  05/29/2011   Procedure: ILEOSTOMY TAKEDOWN;  Surgeon: Harl Bowie, MD;  Location: [place];  Service: General;  Laterality: N/A;  . INSERTION OF DIALYSIS CATHETER Right 04/15/2016   Procedure: INSERTION OF DIALYSIS CATHETER RIGHT INTERNAL JUGULAR;  Surgeon: Conrad Olivia, MD;  Location: [place];  Service: Vascular;  Laterality: Right;  . LAPAROTOMY  02/09/2011   Procedure: EXPLORATORY LAPAROTOMY;  Surgeon: Harl Bowie, MD;  Location: [place];  Service: General;  Laterality: N/A;  . NEPHRECTOMY  2010   right side done at [facility] on transplant list  . PARTIAL COLECTOMY  02/09/2011   Reason for surgery: ischemic colitis of right hemicolon; Surgeon: Harl Bowie, MD;  Location: [place];  Service: General;  Laterality: Right;  . REVISION OF ARTERIOVENOUS GORETEX GRAFT Right 04/15/2016   Procedure: REVISION OF ARTERIOVENOUS GORETEX GRAFT USING 30mmX20cm GORETEX GRAFT;  Surgeon: Conrad Elliston, MD;  Location: [place];  Service: Vascular;  Laterality: Right;  . RIB RESECTION     d/t kidney removal  . TONSILLECTOMY     as a child  . total parathyroidectomy  01/17/2001   with autotransplantation into left forearm // due to secondary hyperparathyroidism from ESRD  . UMBILICAL HERNIA REPAIR  age 1- 39  . UPPER EXTREMITY ANGIOGRAPHY Right 07/21/2017   Procedure: UPPER EXTREMITY ANGIOGRAPHY;  Surgeon: Waynetta Sandy, MD;  Location: [place] [facility];  Service: Cardiovascular;  Laterality: Right;  Marland Kitchen VASCULAR SURGERY     right arm dialysis graft    Social History Social History   Tobacco Use  . Smoking status: Current Every Day Smoker    Packs/day: 0.50    Years: 48.00    Pack years: 24.00    Types: Cigarettes  . Smokeless tobacco: Never Used  . Tobacco comment: 6 cigarettes per day  Substance Use Topics  . Alcohol use: Yes     Alcohol/week: 3.0 - 4.0 standard drinks    Types: 3 - 4 Shots of liquor per week    Comment: 3-4 shots of liquor on Fridays  . Drug use: No    Family History Family History  Problem Relation Age of Onset  . Diabetes Father   . Stroke Mother   . COPD Brother   . Heart attack Brother   . Bronchitis Sister   . Anesthesia problems Neg Hx   . Hypotension Neg Hx   . Malignant hyperthermia Neg Hx   . Pseudochol deficiency Neg Hx   No family history of bleeding/clotting disorders, porphyria or autoimmune disease   Allergies  Allergen Reactions  . Contrast Media [Iodinated Diagnostic Agents]     Seziure  . Iohexol Other (See Comments)    Reaction is convulsions     REVIEW OF SYSTEMS (Negative unless checked)  Constitutional: [] Weight loss  [] Fever  [] Chills Cardiac: [] Chest pain   []   Chest pressure   [] Palpitations   [] Shortness of breath when laying flat   [] Shortness of breath with exertion. Vascular:  [] Pain in legs with walking   [] Pain in legs at rest  [] History of DVT   [] Phlebitis   [] Swelling in legs   [] Varicose veins   [] Non-healing ulcers Pulmonary:   [] Uses home oxygen   [] Productive cough   [] Hemoptysis   [] Wheeze  [] COPD   [] Asthma Neurologic:  [] Dizziness   [] Seizures   [] History of stroke   [] History of TIA  [] Aphasia   [] Vissual changes   [] Weakness or numbness in arm   [] Weakness or numbness in leg Musculoskeletal:   [] Joint swelling   [] Joint pain   [] Low back pain Hematologic:  [] Easy bruising  [] Easy bleeding   [] Hypercoagulable state   [] Anemic Gastrointestinal:  [] Diarrhea   [] Vomiting  [] Gastroesophageal reflux/heartburn   [] Difficulty swallowing. Genitourinary:  [x] Chronic kidney disease   [] Difficult urination  [] Frequent urination   [] Blood in urine Skin:  [] Rashes   [] Ulcers  Psychological:  [] History of anxiety   []  History of major depression.  Physical Examination  There were no vitals filed for this visit. There is no height or weight on file to  calculate BMI. Gen: WD/WN, NAD Head: /AT, No temporalis wasting.  Ear/Nose/Throat: Hearing grossly intact, nares w/o erythema or drainage, poor dentition Eyes: PER, EOMI, sclera nonicteric.  Neck: Supple, no masses.  No bruit or JVD.  Pulmonary:  Good air movement, clear to auscultation bilaterally, no use of accessory muscles.  Cardiac: RRR, normal S1, S2, no Murmurs. Vascular:  Right upper arm AV graft + thrill and + bruit skin over the graft is intact Vessel Right Left  Radial Not Palpable Palpable  Brachial Palpable Palpable  Gastrointestinal: soft, non-distended. No guarding/no peritoneal signs.  Musculoskeletal: M/S 5/5 throughout.  No deformity or atrophy.  Neurologic: CN 2-12 intact. Pain and light touch intact in extremities.  Symmetrical.  Speech is fluent. Motor exam as listed above. Psychiatric: Judgment intact, Mood & affect appropriate for pt's clinical situation. Dermatologic: No rashes or ulcers noted.  No changes consistent with cellulitis. Lymph : No Cervical lymphadenopathy, no lichenification or skin changes of chronic lymphedema.  CBC Lab Results  Component Value Date   WBC 5.7 10/01/2011   HGB 11.2 (L) 07/21/2017   HCT 33.0 (L) 07/21/2017   MCV 81.8 10/01/2011   PLT 271 10/01/2011    BMET    Component Value Date/Time   NA 138 07/21/2017 1013   K 4.0 07/21/2017 1013   CL 102 07/21/2017 1013   CO2 27 10/02/2011 0530   GLUCOSE 76 07/21/2017 1013   BUN 25 (H) 07/21/2017 1013   CREATININE 8.30 (H) 07/21/2017 1013   CALCIUM 8.7 10/02/2011 0530   GFRNONAA 10 (L) 10/02/2011 0530   GFRAA 11 (L) 10/02/2011 0530   CrCl cannot be calculated (Patient's most recent lab result is older than the maximum 21 days allowed.).  COAG Lab Results  Component Value Date   INR 1.05 04/03/2011   INR 1.33 03/07/2011   INR 1.40 02/21/2011    Radiology No results found.   Assessment/Plan 1. Complication of vascular access for dialysis, sequela Recommend:  The  patient is experiencing increasing problems with their dialysis access.  Patient should have a fistulagram with the intention for intervention.  The intention for intervention is to restore appropriate flow and prevent thrombosis and possible loss of the access.  As well as improve the quality of dialysis therapy.  The risks, benefits and alternative therapies were reviewed in detail with the patient.  All questions were answered.  The patient agrees to proceed with angio/intervention.      2. End stage renal disease on dialysis ([place]) At the present time the patient has adequate dialysis access.  Continue hemodialysis as ordered without interruption.  Avoid nephrotoxic medications and dehydration.  Further plans per nephrology  3. Essential hypertension Continue antihypertensive medications as already ordered, these medications have been reviewed and there are no changes at this time.   4. Lumbar stenosis with neurogenic claudication Continue NSAID medications as already ordered, these medications have been reviewed and there are no changes at this time.  Continued activity and therapy was stressed.     Hortencia Pilar, MD  01/17/2019 12:19 PM

## 2019-01-18 ENCOUNTER — Ambulatory Visit (INDEPENDENT_AMBULATORY_CARE_PROVIDER_SITE_OTHER): Payer: Medicare Other | Admitting: Vascular Surgery

## 2019-01-18 ENCOUNTER — Other Ambulatory Visit (INDEPENDENT_AMBULATORY_CARE_PROVIDER_SITE_OTHER): Payer: Self-pay | Admitting: Vascular Surgery

## 2019-01-18 ENCOUNTER — Ambulatory Visit (INDEPENDENT_AMBULATORY_CARE_PROVIDER_SITE_OTHER): Payer: Medicare Other

## 2019-01-18 ENCOUNTER — Encounter (INDEPENDENT_AMBULATORY_CARE_PROVIDER_SITE_OTHER): Payer: Self-pay | Admitting: Vascular Surgery

## 2019-01-18 ENCOUNTER — Other Ambulatory Visit: Payer: Self-pay

## 2019-01-18 VITALS — BP 133/73 | HR 90 | Resp 16 | Wt 100.3 lb

## 2019-01-18 DIAGNOSIS — M48062 Spinal stenosis, lumbar region with neurogenic claudication: Secondary | ICD-10-CM | POA: Diagnosis not present

## 2019-01-18 DIAGNOSIS — T829XXS Unspecified complication of cardiac and vascular prosthetic device, implant and graft, sequela: Secondary | ICD-10-CM

## 2019-01-18 DIAGNOSIS — N186 End stage renal disease: Secondary | ICD-10-CM

## 2019-01-18 DIAGNOSIS — I10 Essential (primary) hypertension: Secondary | ICD-10-CM | POA: Diagnosis not present

## 2019-01-18 DIAGNOSIS — Z992 Dependence on renal dialysis: Secondary | ICD-10-CM

## 2019-01-22 ENCOUNTER — Telehealth (INDEPENDENT_AMBULATORY_CARE_PROVIDER_SITE_OTHER): Payer: Self-pay

## 2019-01-22 NOTE — Telephone Encounter (Signed)
Spoke with the patient's daughter and she is now scheduled with Dr. Delana Meyer for right arm fistulagram on 02/05/2019 with a 10:00 am arrival time to the [facility]. Patient will do her Covid testing on 02/02/2019 between 12:30-2:30 pm at the [facility]. Pre-procedure instructions were discussed and will be mailed to the patient.

## 2019-02-02 ENCOUNTER — Other Ambulatory Visit: Payer: Self-pay

## 2019-02-02 ENCOUNTER — Other Ambulatory Visit
Admission: RE | Admit: 2019-02-02 | Discharge: 2019-02-02 | Disposition: A | Discharge status: Home / Self Care | Payer: Medicare Other | Source: Ambulatory Visit | Attending: Vascular Surgery | Admitting: Vascular Surgery

## 2019-02-02 DIAGNOSIS — Z01812 Encounter for preprocedural laboratory examination: Secondary | ICD-10-CM | POA: Insufficient documentation

## 2019-02-02 DIAGNOSIS — Z20828 Contact with and (suspected) exposure to other viral communicable diseases: Secondary | ICD-10-CM | POA: Insufficient documentation

## 2019-02-03 LAB — SARS CORONAVIRUS 2 (TAT 6-24 HRS): SARS Coronavirus 2: NEGATIVE

## 2019-02-04 ENCOUNTER — Other Ambulatory Visit (INDEPENDENT_AMBULATORY_CARE_PROVIDER_SITE_OTHER): Payer: Self-pay | Admitting: Nurse Practitioner

## 2019-02-05 ENCOUNTER — Ambulatory Visit
Admission: RE | Admit: 2019-02-05 | Discharge: 2019-02-05 | Disposition: A | Discharge status: Home / Self Care | Payer: Medicare Other | Attending: Vascular Surgery | Admitting: Vascular Surgery

## 2019-02-05 ENCOUNTER — Encounter
Admission: RE | Disposition: A | Discharge status: Home / Self Care | Payer: Self-pay | Source: Home / Self Care | Attending: Vascular Surgery

## 2019-02-05 ENCOUNTER — Other Ambulatory Visit: Payer: Self-pay

## 2019-02-05 DIAGNOSIS — T82510S Breakdown (mechanical) of surgically created arteriovenous fistula, sequela: Secondary | ICD-10-CM | POA: Diagnosis not present

## 2019-02-05 DIAGNOSIS — Z992 Dependence on renal dialysis: Secondary | ICD-10-CM | POA: Insufficient documentation

## 2019-02-05 DIAGNOSIS — Y841 Kidney dialysis as the cause of abnormal reaction of the patient, or of later complication, without mention of misadventure at the time of the procedure: Secondary | ICD-10-CM | POA: Insufficient documentation

## 2019-02-05 DIAGNOSIS — T82898A Other specified complication of vascular prosthetic devices, implants and grafts, initial encounter: Secondary | ICD-10-CM

## 2019-02-05 DIAGNOSIS — M48062 Spinal stenosis, lumbar region with neurogenic claudication: Secondary | ICD-10-CM | POA: Insufficient documentation

## 2019-02-05 DIAGNOSIS — F17210 Nicotine dependence, cigarettes, uncomplicated: Secondary | ICD-10-CM | POA: Diagnosis not present

## 2019-02-05 DIAGNOSIS — N186 End stage renal disease: Secondary | ICD-10-CM

## 2019-02-05 DIAGNOSIS — I120 Hypertensive chronic kidney disease with stage 5 chronic kidney disease or end stage renal disease: Secondary | ICD-10-CM | POA: Diagnosis not present

## 2019-02-05 HISTORY — PX: A/V FISTULAGRAM: CATH118298

## 2019-02-05 LAB — POTASSIUM (ARMC VASCULAR LAB ONLY): Potassium (ARMC vascular lab): 4 (ref 3.5–5.1)

## 2019-02-05 SURGERY — A/V FISTULAGRAM
Anesthesia: Moderate Sedation | Laterality: Right

## 2019-02-05 MED ORDER — SODIUM CHLORIDE 0.9 % IV SOLN
INTRAVENOUS | Status: DC
  Administered 2019-02-05: 11:00:00 via INTRAVENOUS

## 2019-02-05 MED ORDER — HEPARIN SODIUM (PORCINE) 1000 UNIT/ML IJ SOLN
INTRAMUSCULAR | Status: DC | PRN
  Administered 2019-02-05: 3000 [IU] via INTRAVENOUS

## 2019-02-05 MED ORDER — CEFAZOLIN SODIUM-DEXTROSE 1-4 GM/50ML-% IV SOLN
INTRAVENOUS | Status: AC
  Administered 2019-02-05: 1 g via INTRAVENOUS
  Filled 2019-02-05: qty 50

## 2019-02-05 MED ORDER — HEPARIN SODIUM (PORCINE) 1000 UNIT/ML IJ SOLN
INTRAMUSCULAR | Status: AC
  Filled 2019-02-05: qty 1

## 2019-02-05 MED ORDER — MIDAZOLAM HCL 2 MG/ML PO SYRP: 8.0000 mg | ORAL_SOLUTION | Freq: Once | ORAL | Status: DC | PRN

## 2019-02-05 MED ORDER — METHYLPREDNISOLONE SODIUM SUCC 125 MG IJ SOLR
INTRAMUSCULAR | Status: AC
  Administered 2019-02-05: 125 mg via INTRAVENOUS
  Filled 2019-02-05: qty 2

## 2019-02-05 MED ORDER — FAMOTIDINE 20 MG PO TABS
40.0000 mg | ORAL_TABLET | Freq: Once | ORAL | Status: AC | PRN
  Administered 2019-02-05: 11:00:00 40 mg via ORAL

## 2019-02-05 MED ORDER — HYDROMORPHONE HCL 1 MG/ML IJ SOLN: 1.0000 mg | Freq: Once | INTRAMUSCULAR | Status: DC | PRN

## 2019-02-05 MED ORDER — FENTANYL CITRATE (PF) 100 MCG/2ML IJ SOLN
INTRAMUSCULAR | Status: AC
  Filled 2019-02-05: qty 2

## 2019-02-05 MED ORDER — CEFAZOLIN SODIUM-DEXTROSE 1-4 GM/50ML-% IV SOLN
1.0000 g | Freq: Once | INTRAVENOUS | Status: AC
  Administered 2019-02-05: 13:00:00 1 g via INTRAVENOUS

## 2019-02-05 MED ORDER — MIDAZOLAM HCL 2 MG/2ML IJ SOLN
INTRAMUSCULAR | Status: DC | PRN
  Administered 2019-02-05 (×2): 1 mg via INTRAVENOUS

## 2019-02-05 MED ORDER — METHYLPREDNISOLONE SODIUM SUCC 125 MG IJ SOLR
125.0000 mg | Freq: Once | INTRAMUSCULAR | Status: AC | PRN
  Administered 2019-02-05: 11:00:00 125 mg via INTRAVENOUS

## 2019-02-05 MED ORDER — DIPHENHYDRAMINE HCL 50 MG/ML IJ SOLN
50.0000 mg | Freq: Once | INTRAMUSCULAR | Status: AC | PRN
  Administered 2019-02-05: 11:00:00 50 mg via INTRAVENOUS

## 2019-02-05 MED ORDER — MIDAZOLAM HCL 5 MG/5ML IJ SOLN
INTRAMUSCULAR | Status: AC
  Filled 2019-02-05: qty 5

## 2019-02-05 MED ORDER — DIPHENHYDRAMINE HCL 50 MG/ML IJ SOLN
INTRAMUSCULAR | Status: AC
  Administered 2019-02-05: 50 mg via INTRAVENOUS
  Filled 2019-02-05: qty 1

## 2019-02-05 MED ORDER — ONDANSETRON HCL 4 MG/2ML IJ SOLN: 4.0000 mg | Freq: Four times a day (QID) | INTRAMUSCULAR | Status: DC | PRN

## 2019-02-05 MED ORDER — FENTANYL CITRATE (PF) 100 MCG/2ML IJ SOLN
INTRAMUSCULAR | Status: DC | PRN
  Administered 2019-02-05: 50 ug via INTRAVENOUS
  Administered 2019-02-05: 25 ug via INTRAVENOUS

## 2019-02-05 MED ORDER — FAMOTIDINE 20 MG PO TABS
ORAL_TABLET | ORAL | Status: AC
  Administered 2019-02-05: 40 mg via ORAL
  Filled 2019-02-05: qty 2

## 2019-02-05 SURGICAL SUPPLY — 19 items
BALLN ATG 12X6X80 (BALLOONS) ×2
BALLN DORADO 10X40X80 (BALLOONS) ×2
BALLN DORADO 8X60X80 (BALLOONS) ×2
BALLOON ATG 12X6X80 (BALLOONS) IMPLANT
BALLOON DORADO 10X40X80 (BALLOONS) IMPLANT
BALLOON DORADO 8X60X80 (BALLOONS) IMPLANT
CATH BEACON 5 .035 40 KMP TP (CATHETERS) IMPLANT
CATH BEACON 5 .038 40 KMP TP (CATHETERS) ×1
DEVICE PRESTO INFLATION (MISCELLANEOUS) ×1 IMPLANT
DEVICE TORQUE .025-.038 (MISCELLANEOUS) ×1 IMPLANT
GLIDEWIRE STIFF .35X180X3 HYDR (WIRE) ×1 IMPLANT
NDL ENTRY 21GA 7CM ECHOTIP (NEEDLE) IMPLANT
NEEDLE ENTRY 21GA 7CM ECHOTIP (NEEDLE) ×2 IMPLANT
PACK ANGIOGRAPHY (CUSTOM PROCEDURE TRAY) ×2 IMPLANT
SET INTRO CAPELLA COAXIAL (SET/KITS/TRAYS/PACK) ×1 IMPLANT
SHEATH BRITE TIP 6FRX5.5 (SHEATH) ×1 IMPLANT
SHEATH BRITE TIP 7FRX5.5 (SHEATH) ×1 IMPLANT
SUT MNCRL AB 4-0 PS2 18 (SUTURE) ×1 IMPLANT
WIRE MAGIC TOR.035 180C (WIRE) ×1 IMPLANT

## 2019-02-05 NOTE — H&P (Signed)
[facility] History & Physical Update  The patient was interviewed and re-examined.  The patient's previous History and Physical has been reviewed and is unchanged.  There is no change in the plan of care. We plan to proceed with the scheduled procedure.  Hortencia Pilar, MD  02/05/2019, 11:23 AM

## 2019-02-05 NOTE — Op Note (Signed)
OPERATIVE NOTE   PROCEDURE: 1. Contrast injection right arm brachial axillary AV access 2. Percutaneous transluminal angioplasty peripheral segment to 8 mm with a Dorado balloon 3. Percutaneous transluminal angioplasty central venous segment to 14 mm with an Atlas balloon  PRE-OPERATIVE DIAGNOSIS: Complication of dialysis access                                                       End Stage Renal Disease  POST-OPERATIVE DIAGNOSIS: same as above   SURGEON: Katha Cabal, M.D.  ANESTHESIA: Conscious sedation was administered under my direct supervision by the interventional radiology RN. IV Versed plus fentanyl were utilized. Continuous ECG, pulse oximetry and blood pressure was monitored throughout the entire procedure.  Conscious sedation was for a total of 30.  ESTIMATED BLOOD LOSS: minimal  FINDING(S): Stricture of the AV graft within the peripheral segment as well as a stricture within the central venous portion  SPECIMEN(S):  None  CONTRAST: 35 cc  FLUOROSCOPY TIME: 2.6 minutes  INDICATIONS: [name] is a 67 y.o. female who  presents with malfunctioning right arm AV access.  The patient is scheduled for angiography with possible intervention of the AV access.  The patient is aware the risks include but are not limited to: bleeding, infection, thrombosis of the cannulated access, and possible anaphylactic reaction to the contrast.  The patient acknowledges if the access can not be salvaged a tunneled catheter will be needed and will be placed during this procedure.  The patient is aware of the risks of the procedure and elects to proceed with the angiogram and intervention.  DESCRIPTION: After full informed written consent was obtained, the patient was brought back to the Special Procedure suite and placed supine position.  Appropriate cardiopulmonary monitors were placed.  The right arm was prepped and draped in the standard fashion.  Appropriate timeout is called.  The right brachial axillary was cannulated with a micropuncture needle.  Cannulation was performed with ultrasound guidance. Ultrasound was placed in a sterile sleeve, the AV access was interrogated and noted to be echolucent and compressible indicating patency. Image was recorded for the permanent record. The puncture is performed under continuous ultrasound visualization.   The microwire was advanced and the needle was exchanged for  a microsheath.  The J-wire was then advanced and a 6 Fr sheath inserted.  Hand injections were completed to image the access from the arterial anastomosis through the entire access.  The central venous structures were also imaged by hand injections.  The graft appears to be in relatively good shape.  Previously splay stent at the venous anastomosis has a 70% narrowing at its leading edge.  The stent is otherwise widely patent filling the axillary vein which is widely patent.  Subclavian vein is widely patent throughout its course up to its confluence with the internal jugular vein at which point there is a 80% narrowing.  The innominate vein and superior vena cava appear widely patent.  Reflux of contrast into the brachial artery demonstrates the anastomosis patent.  The anastomotic area is mildly enlarged.  Based on the images,  3000 units of heparin was given and a wire was negotiated through the strictures within the venous portion of the graft as well as the central stenosis. The sheath was then upsized to a 7 [place] sheath.  An 10 x 80 Dorado balloon was used.  Inflation was to 24 atm for 1 minute.  Follow-up imaging demonstrated the venous stricture was still undersized and a 14 x 60 Atlas balloon was advanced across the stenosis and inflation was to 18 atm 1 minute.  Follow-up imaging now demonstrated less than 20% residual stenosis.  The detector was then repositioned over the peripheral portion of the AV access and 8 mm x 40 mm Dorado balloon was used to treat the  stricture within the AV access. Inflation was to 20 atm for 1 minutes.  Follow-up imaging demonstrates significant improvement with less than 10% residual stenosis within the graft with a marked reduction of the strictures.  There is now rapid flow of contrast through the graft and the central veins.   A 4-0 Monocryl purse-string suture was sewn around the sheath.  The sheath was removed and light pressure was applied.  A sterile bandage was applied to the puncture site.    COMPLICATIONS: None  CONDITION: [name], M.D [facility] Office: [phone]  02/05/2019 2:32 PM

## 2019-02-08 ENCOUNTER — Encounter: Payer: Self-pay | Admitting: Vascular Surgery

## 2019-02-16 ENCOUNTER — Other Ambulatory Visit (INDEPENDENT_AMBULATORY_CARE_PROVIDER_SITE_OTHER): Payer: Self-pay | Admitting: Vascular Surgery

## 2019-02-16 ENCOUNTER — Other Ambulatory Visit: Payer: Self-pay

## 2019-02-16 DIAGNOSIS — Z9862 Peripheral vascular angioplasty status: Secondary | ICD-10-CM

## 2019-02-16 DIAGNOSIS — Z20822 Contact with and (suspected) exposure to covid-19: Secondary | ICD-10-CM

## 2019-02-16 DIAGNOSIS — T829XXS Unspecified complication of cardiac and vascular prosthetic device, implant and graft, sequela: Secondary | ICD-10-CM

## 2019-02-18 LAB — NOVEL CORONAVIRUS, NAA: SARS-CoV-2, NAA: NOT DETECTED

## 2019-02-19 ENCOUNTER — Ambulatory Visit (INDEPENDENT_AMBULATORY_CARE_PROVIDER_SITE_OTHER): Payer: Medicare Other | Admitting: Nurse Practitioner

## 2019-02-19 ENCOUNTER — Encounter (INDEPENDENT_AMBULATORY_CARE_PROVIDER_SITE_OTHER): Payer: Medicare Other

## 2019-02-23 ENCOUNTER — Emergency Department (HOSPITAL_BASED_OUTPATIENT_CLINIC_OR_DEPARTMENT_OTHER): Payer: Medicare Other

## 2019-02-23 ENCOUNTER — Other Ambulatory Visit: Payer: Self-pay

## 2019-02-23 ENCOUNTER — Encounter (HOSPITAL_BASED_OUTPATIENT_CLINIC_OR_DEPARTMENT_OTHER): Payer: Self-pay | Admitting: *Deleted

## 2019-02-23 ENCOUNTER — Emergency Department (HOSPITAL_BASED_OUTPATIENT_CLINIC_OR_DEPARTMENT_OTHER)
Admission: EM | Admit: 2019-02-23 | Discharge: 2019-02-24 | Disposition: A | Discharge status: Home / Self Care | Payer: Medicare Other | Source: Home / Self Care | Attending: Emergency Medicine | Admitting: Emergency Medicine

## 2019-02-23 DIAGNOSIS — E1122 Type 2 diabetes mellitus with diabetic chronic kidney disease: Secondary | ICD-10-CM | POA: Diagnosis present

## 2019-02-23 DIAGNOSIS — Z20828 Contact with and (suspected) exposure to other viral communicable diseases: Secondary | ICD-10-CM | POA: Diagnosis present

## 2019-02-23 DIAGNOSIS — N186 End stage renal disease: Secondary | ICD-10-CM | POA: Diagnosis present

## 2019-02-23 DIAGNOSIS — R0602 Shortness of breath: Secondary | ICD-10-CM

## 2019-02-23 DIAGNOSIS — Z888 Allergy status to other drugs, medicaments and biological substances status: Secondary | ICD-10-CM

## 2019-02-23 DIAGNOSIS — Z905 Acquired absence of kidney: Secondary | ICD-10-CM

## 2019-02-23 DIAGNOSIS — J440 Chronic obstructive pulmonary disease with acute lower respiratory infection: Secondary | ICD-10-CM | POA: Diagnosis present

## 2019-02-23 DIAGNOSIS — Z9049 Acquired absence of other specified parts of digestive tract: Secondary | ICD-10-CM

## 2019-02-23 DIAGNOSIS — Z8249 Family history of ischemic heart disease and other diseases of the circulatory system: Secondary | ICD-10-CM

## 2019-02-23 DIAGNOSIS — Z992 Dependence on renal dialysis: Secondary | ICD-10-CM

## 2019-02-23 DIAGNOSIS — Z79899 Other long term (current) drug therapy: Secondary | ICD-10-CM

## 2019-02-23 DIAGNOSIS — B1920 Unspecified viral hepatitis C without hepatic coma: Secondary | ICD-10-CM | POA: Diagnosis present

## 2019-02-23 DIAGNOSIS — Y95 Nosocomial condition: Secondary | ICD-10-CM | POA: Diagnosis present

## 2019-02-23 DIAGNOSIS — R05 Cough: Secondary | ICD-10-CM | POA: Diagnosis not present

## 2019-02-23 DIAGNOSIS — Z885 Allergy status to narcotic agent status: Secondary | ICD-10-CM

## 2019-02-23 DIAGNOSIS — Z79891 Long term (current) use of opiate analgesic: Secondary | ICD-10-CM

## 2019-02-23 DIAGNOSIS — I120 Hypertensive chronic kidney disease with stage 5 chronic kidney disease or end stage renal disease: Secondary | ICD-10-CM | POA: Diagnosis present

## 2019-02-23 DIAGNOSIS — F17210 Nicotine dependence, cigarettes, uncomplicated: Secondary | ICD-10-CM | POA: Diagnosis present

## 2019-02-23 DIAGNOSIS — J189 Pneumonia, unspecified organism: Secondary | ICD-10-CM

## 2019-02-23 DIAGNOSIS — Z981 Arthrodesis status: Secondary | ICD-10-CM

## 2019-02-23 DIAGNOSIS — Z20822 Contact with and (suspected) exposure to covid-19: Secondary | ICD-10-CM

## 2019-02-23 DIAGNOSIS — Z91041 Radiographic dye allergy status: Secondary | ICD-10-CM

## 2019-02-23 DIAGNOSIS — Z833 Family history of diabetes mellitus: Secondary | ICD-10-CM

## 2019-02-23 LAB — COMPREHENSIVE METABOLIC PANEL
ALT: 12 U/L (ref 0–44)
AST: 22 U/L (ref 15–41)
Albumin: 3.4 g/dL — ABNORMAL LOW (ref 3.5–5.0)
Alkaline Phosphatase: 85 U/L (ref 38–126)
Anion gap: 19 — ABNORMAL HIGH (ref 5–15)
BUN: 60 mg/dL — ABNORMAL HIGH (ref 8–23)
CO2: 23 mmol/L (ref 22–32)
Calcium: 7.8 mg/dL — ABNORMAL LOW (ref 8.9–10.3)
Chloride: 90 mmol/L — ABNORMAL LOW (ref 98–111)
Creatinine, Ser: 10.11 mg/dL — ABNORMAL HIGH (ref 0.44–1.00)
GFR calc Af Amer: 4 mL/min — ABNORMAL LOW (ref >60)
GFR calc non Af Amer: 4 mL/min — ABNORMAL LOW (ref >60)
Glucose, Bld: 119 mg/dL — ABNORMAL HIGH (ref 70–99)
Potassium: 5 mmol/L (ref 3.5–5.1)
Sodium: 132 mmol/L — ABNORMAL LOW (ref 135–145)
Total Bilirubin: 1.2 mg/dL (ref 0.3–1.2)
Total Protein: 7.7 g/dL (ref 6.5–8.1)

## 2019-02-23 LAB — LACTIC ACID, PLASMA
Lactic Acid, Venous: 2.1 mmol/L (ref 0.5–1.9)
Lactic Acid, Venous: 2.1 mmol/L (ref 0.5–1.9)

## 2019-02-23 LAB — CBC WITH DIFFERENTIAL/PLATELET
Abs Immature Granulocytes: 0.09 10*3/uL — ABNORMAL HIGH (ref 0.00–0.07)
Basophils Absolute: 0 10*3/uL (ref 0.0–0.1)
Basophils Relative: 0 %
Eosinophils Absolute: 0.2 10*3/uL (ref 0.0–0.5)
Eosinophils Relative: 1 %
HCT: 29.4 % — ABNORMAL LOW (ref 36.0–46.0)
Hemoglobin: 9.7 g/dL — ABNORMAL LOW (ref 12.0–15.0)
Immature Granulocytes: 1 %
Lymphocytes Relative: 2 %
Lymphs Abs: 0.3 10*3/uL — ABNORMAL LOW (ref 0.7–4.0)
MCH: 27.6 pg (ref 26.0–34.0)
MCHC: 33 g/dL (ref 30.0–36.0)
MCV: 83.8 fL (ref 80.0–100.0)
Monocytes Absolute: 1 10*3/uL (ref 0.1–1.0)
Monocytes Relative: 7 %
Neutro Abs: 14 10*3/uL — ABNORMAL HIGH (ref 1.7–7.7)
Neutrophils Relative %: 89 %
Platelets: 295 10*3/uL (ref 150–400)
RBC: 3.51 MIL/uL — ABNORMAL LOW (ref 3.87–5.11)
RDW: 15.4 % (ref 11.5–15.5)
WBC: 15.6 10*3/uL — ABNORMAL HIGH (ref 4.0–10.5)
nRBC: 0 % (ref 0.0–0.2)

## 2019-02-23 LAB — LACTATE DEHYDROGENASE: LDH: 185 U/L (ref 98–192)

## 2019-02-23 LAB — HIV ANTIBODY (ROUTINE TESTING W REFLEX): HIV Screen 4th Generation wRfx: NONREACTIVE

## 2019-02-23 LAB — PROCALCITONIN: Procalcitonin: 51.53 ng/mL

## 2019-02-23 LAB — C-REACTIVE PROTEIN: CRP: 36.2 mg/dL — ABNORMAL HIGH (ref <1.0)

## 2019-02-23 LAB — PROTIME-INR
INR: 1.3 — ABNORMAL HIGH (ref 0.8–1.2)
Prothrombin Time: 15.8 seconds — ABNORMAL HIGH (ref 11.4–15.2)

## 2019-02-23 LAB — ABO/RH: ABO/RH(D): B POS

## 2019-02-23 LAB — FIBRINOGEN: Fibrinogen: 751 mg/dL — ABNORMAL HIGH (ref 210–475)

## 2019-02-23 LAB — FERRITIN: Ferritin: 1410 ng/mL — ABNORMAL HIGH (ref 11–307)

## 2019-02-23 LAB — BRAIN NATRIURETIC PEPTIDE: B Natriuretic Peptide: 488.5 pg/mL — ABNORMAL HIGH (ref 0.0–100.0)

## 2019-02-23 LAB — APTT: aPTT: 43 seconds — ABNORMAL HIGH (ref 24–36)

## 2019-02-23 LAB — SARS CORONAVIRUS 2 AG (30 MIN TAT): SARS Coronavirus 2 Ag: NEGATIVE

## 2019-02-23 MED ORDER — VANCOMYCIN HCL IN DEXTROSE 1-5 GM/200ML-% IV SOLN
1000.0000 mg | Freq: Once | INTRAVENOUS | Status: AC
  Administered 2019-02-23: 1000 mg via INTRAVENOUS
  Filled 2019-02-23: qty 200

## 2019-02-23 MED ORDER — SODIUM CHLORIDE 0.9 % IV SOLN
2.0000 g | Freq: Once | INTRAVENOUS | Status: AC
  Administered 2019-02-23: 2 g via INTRAVENOUS
  Filled 2019-02-23: qty 2

## 2019-02-23 MED ORDER — VANCOMYCIN HCL IN DEXTROSE 500-5 MG/100ML-% IV SOLN
500.0000 mg | INTRAVENOUS | Status: DC
  Filled 2019-02-23: qty 100

## 2019-02-23 MED ORDER — ACETAMINOPHEN 325 MG PO TABS
650.0000 mg | ORAL_TABLET | Freq: Once | ORAL | Status: DC
  Filled 2019-02-23: qty 2

## 2019-02-23 MED ORDER — SODIUM CHLORIDE 0.9 % IV SOLN: 1.0000 g | INTRAVENOUS | Status: DC

## 2019-02-23 MED ORDER — IBUPROFEN 400 MG PO TABS
ORAL_TABLET | ORAL | Status: AC
  Filled 2019-02-23: qty 1

## 2019-02-23 MED ORDER — IBUPROFEN 400 MG PO TABS
400.0000 mg | ORAL_TABLET | Freq: Once | ORAL | Status: AC
  Administered 2019-02-23: 400 mg via ORAL
  Filled 2019-02-23: qty 1

## 2019-02-23 NOTE — Progress Notes (Signed)
Pharmacy Antibiotic Note  [name] is a 68 y.o. female admitted on 02/23/2019 with pneumonia.  Pharmacy has been consulted for Cefepime and Vancomycin dosing. Patient is an HD patient and appears to be receiving HD on T-TH-Sa.   Plan: - Loading dose of Vancomycin 1000 mg and Cefepime 2g IV x 1 dose  - Followed by Cefepime 1g IV q24h and Vancomycin 500 mg IV Q T-Th-Sa after HS  - Monitor patient's HD sessions and adjust doses as needed.  - Monitor for de-escalation of abx    Height: 5\' 5"  (165.1 cm) Weight: 120 lb (54.4 kg) IBW/kg (Calculated) : 57  Temp (24hrs), Avg:102.7 F (39.3 C), Min:102.7 F (39.3 C), Max:102.7 F (39.3 C)  Recent Labs  Lab 02/23/19 1921  WBC 15.6*  CREATININE 10.11*  LATICACIDVEN 2.1*    Estimated Creatinine Clearance: 4.6 mL/min (A) (by C-G formula based on SCr of 10.11 mg/dL (H)).    Allergies  Allergen Reactions  . Iodinated Diagnostic Agents Other (See Comments)    Seziure Other reaction(s): Other (See Comments) Seziure   . Hydrocodone Nausea And Vomiting  . Metoprolol Nausea And Vomiting  . Iohexol Other (See Comments)    Reaction is convulsions    Antimicrobials this admission: 12/8 Cefepime >>  12/8 Vancomycin >>   Dose adjustments this admission:   Microbiology results: 12/ BCx: Pending   Thank you for allowing pharmacy to be a part of this patient's care.  Duanne Limerick  PharmD. BCPS 02/23/2019 8:18 PM

## 2019-02-23 NOTE — ED Notes (Signed)
[name] [id number]

## 2019-02-23 NOTE — ED Provider Notes (Signed)
[facility] EMERGENCY DEPARTMENT Provider Note   CSN: [id number] Arrival date & time: 02/23/19  1900     History   Chief Complaint Chief Complaint  Patient presents with   Cough    HPI [name] is a 68 y.o. female.     68 y.o female with a PMH of Anemia, dysrhythmia, hepatitis C, end-stage renal disease currently on dialysis presents to the ED with a chief complaint of cough, fever x2 weeks.  Patient's daughter is providing most of the history, she reports she is had these cough for the past 2 weeks, several attempts were made to call EMS however patient refused transfer to the ED.  Daughter reports mother has been running a fever daily, T-max of 104.  She did miss dialysis today, last time being dialyzed with Saturday.  Daughter also reports she has had a productive cough, unsure on color of the sputum.  Patient received 1000 mg of Tylenol prior to arrival into the ED, fever is 102.7.  Patient reports she was seen via telehealth by her PCP about a week ago, she was doing an inhaler, she has been using this in order to help with her symptoms.  The history is provided by the patient and medical records. No language interpreter was used.    Past Medical History:  Diagnosis Date   Anemia    anemia of chronic disease likely 2/2 ESRD per last anemia panel (02/2011) with Fe 35, TIBC 167, ferritin 2041  // BL Hgb 8-10   Anuria    Due to dialysis   Aortic stenosis, mild    03/2011 echo   Arthritis    Back   Blood transfusion 1990's   r/t Kidney removal surgery   Brain aneurysm    No records could be found   Colitis, ischemic ([place]) 01/2011   S/P partial colectomy of right hemicolon and ileostomy placement   Diabetes mellitus    Borderline   Dialysis patient [facility])    [place] Tues,Thursday, saturday   Dysrhythmia    SVT for brief period in Feb 2013   End stage renal disease on dialysis [facility])    Secondary to hypertension // T/Th/Sat dialysis  on [employer]   Foot drop, bilateral    after back surgery   Heart murmur    Born with heart mumur, does not require follow up per pt   Hepatitis C    Hypertension    Does not see a heart doctor, had pre transplant stress test at [facility]     Lumbar spinal stenosis     bilateral L4-5 and L5-S1 posterior lumbar interbody arthrodesis and bilateral L4-S1 posterior lateral arthrodesis for lumbar stenosis dynamic lumbar spondylolisthesis lumbar spondylosis lumbar radiculopathy with foot drop // s/p surgery 01/2011 - see surgery section for details.   Seizures ([place])    r/t HTN in 123XX123 x 1   Umbilical hernia age 75    Patient Active Problem List   Diagnosis Date Noted   Complication of vascular access for dialysis 01/17/2019   Rheumatoid arthritis ([place]) 99991111   Complication of vascular dialysis catheter 04/15/2016   At high risk for falls 05/19/2015   Bilateral foot-drop 05/19/2015   History of spontaneous subarachnoid intracranial hemorrhage due to cerebral aneurysm 05/19/2015   Tobacco dependence 05/19/2015   Underimmunization status 05/06/2015   Headache 12/08/2013   Fever, unspecified 12/08/2013   Pruritus, unspecified 12/08/2013   Diarrhea, unspecified 12/02/2013   Pain, unspecified 12/02/2013   Coagulation  defect, unspecified ([place]) 11/29/2013   Muscle weakness (generalized) 05/15/2012   Abnormality of gait 05/15/2012   Other acquired deformity of ankle and foot(736.79) 05/15/2012   Iron deficiency anemia, unspecified 02/17/2012   Lumbar radiculitis 01/17/2012   Post herpetic neuralgia 11/25/2011   Leg pain, right 10/14/2011   Valtrex overdose 10/01/2011   Shingles rash 09/30/2011   Body mass index (BMI) less than or equal to 19 in adult 07/10/2011   Unspecified protein-calorie malnutrition ([place]) 06/12/2011   Preop cardiovascular exam 05/28/2011   Sinus tachycardia 04/23/2011   Aortic stenosis 04/23/2011   Ileostomy in place  [facility]) 04/22/2011   Other specified cardiac arrhythmias 04/04/2011   Other acquired deformities of unspecified foot 03/22/2011   Anemia of chronic kidney failure 02/09/2011   Acute ischemic colitis ([place]) 02/09/2011   Neuralgia and neuritis, unspecified 02/01/2011   Lumbar stenosis with neurogenic claudication 01/30/2011   Spondylolisthesis of lumbar region 01/30/2011   End stage renal disease on dialysis ([place]) 01/30/2011   Hypertension 01/30/2011   Unspecified viral hepatitis C without hepatic coma 01/30/2011   Hepatitis C antibody test positive 01/30/2011   Alcohol abuse, uncomplicated 123XX123   Awaiting organ transplant status 03/17/2009   Benign neoplasm of unspecified kidney 03/17/2009   Disorder of phosphorus metabolism, unspecified 03/17/2009   Hypercalcemia 03/17/2009   Other long term (current) drug therapy 03/17/2009   Personal history of pneumonia (recurrent) 03/17/2009   Patient's noncompliance with other medical treatment and regimen 03/17/2009   Polycystic kidney, adult type 03/17/2009   Acquired absence of kidney 06/21/2008   Disorder of kidney and ureter, unspecified 06/14/2008   Other abnormal glucose 05/06/2006   Type 2 diabetes mellitus without complications ([place]) 123XX123   Secondary hyperparathyroidism of renal origin ([place]) 12/17/2002   Postprocedural hypoparathyroidism ([place]) 01/16/2001   Contact with and (suspected) exposure to potentially hazardous body fluids 03/17/2000   Other intervertebral disc degeneration, lumbosacral region 09/06/1999   Allergy status to other drugs, medicaments and biological substances status 02/03/1998   Hypertensive chronic kidney disease with stage 5 chronic kidney disease or end stage renal disease ([place]) 02/03/1998   Nonrheumatic mitral (valve) insufficiency 02/03/1998   Dependence on renal dialysis ([place]) 03/18/1992    Past Surgical History:  Procedure Laterality Date   A/V FISTULAGRAM Right  02/05/2019   Procedure: A/V FISTULAGRAM;  Surgeon: Katha Cabal, MD;  Location: [place] CV LAB;  Service: Cardiovascular;  Laterality: Right;   APPENDECTOMY  1960's   AV FISTULA PLACEMENT  04/08/2011   Procedure: INSERTION OF ARTERIOVENOUS (AV) GORE-TEX GRAFT ARM;  Surgeon: Rosetta Posner, MD;  Location: [place];  Service: Vascular;  Laterality: Right;  Revision of Upper Arm Gore-Tex Graft   BACK SURGERY  01/30/2011   L4-S1 lumbar laminectomy facetectomy and foraminotomies for decompression a bilateral L4-5 and L5-S1 posterior lumbar interbody arthrodesis and bilateral L4-S1 posterior lateral arthrodesis for lumbar stenosis dynamic lumbar spondylolisthesis lumbar spondylosis lumbar radiculopathy with foot drop   COLONOSCOPY  02/19/2011   Procedure: COLONOSCOPY;  Surgeon: Missy Sabins, MD;  Location: [place];  Service: Endoscopy;  Laterality: N/A;   COLONOSCOPY  03/07/2011   Procedure: COLONOSCOPY;  Surgeon: Cleotis Nipper, MD;  Location: [facility] ENDOSCOPY;  Service: Endoscopy;  Laterality: N/A;  no prep needed--pt has long Hartmann's pouch and ileostomy   COLOSTOMY  02/09/2011   Reason for surgery: ischemic colitis of right hemicolon requiring partial colectomy and ileostomy.  Surgeon: Harl Bowie, MD;  Location: [place];  Service: General;  Laterality: Right;  ILEOSTOMY CLOSURE  05/29/2011   Procedure: ILEOSTOMY TAKEDOWN;  Surgeon: Harl Bowie, MD;  Location: [place];  Service: General;  Laterality: N/A;   INSERTION OF DIALYSIS CATHETER Right 04/15/2016   Procedure: INSERTION OF DIALYSIS CATHETER RIGHT INTERNAL JUGULAR;  Surgeon: Conrad Harvey [place], MD;  Location: [place];  Service: Vascular;  Laterality: Right;   LAPAROTOMY  02/09/2011   Procedure: EXPLORATORY LAPAROTOMY;  Surgeon: Harl Bowie, MD;  Location: [place];  Service: General;  Laterality: N/A;   NEPHRECTOMY  2010   right side done at [facility] on transplant list   PARTIAL COLECTOMY  02/09/2011   Reason for  surgery: ischemic colitis of right hemicolon; Surgeon: Harl Bowie, MD;  Location: [place];  Service: General;  Laterality: Right;   REVISION OF ARTERIOVENOUS GORETEX GRAFT Right 04/15/2016   Procedure: REVISION OF ARTERIOVENOUS GORETEX GRAFT USING 71mmX20cm GORETEX GRAFT;  Surgeon: Conrad Harper, MD;  Location: [place];  Service: Vascular;  Laterality: Right;   RIB RESECTION     d/t kidney removal   TONSILLECTOMY     as a child   total parathyroidectomy  01/17/2001   with autotransplantation into left forearm // due to secondary hyperparathyroidism from ESRD   [place]  age 64- 67   UPPER EXTREMITY ANGIOGRAPHY Right 07/21/2017   Procedure: UPPER EXTREMITY ANGIOGRAPHY;  Surgeon: Waynetta Sandy, MD;  Location: [place] CV LAB;  Service: Cardiovascular;  Laterality: Right;   VASCULAR SURGERY     right arm dialysis graft     OB History   No obstetric history on file.      Home Medications    Prior to Admission medications   Medication Sig Start Date End Date Taking? Authorizing Provider  benzonatate (TESSALON PERLES) 100 MG capsule Take 100 mg by mouth daily as needed for cough.  02/17/18   [provider]  calcium carbonate (CALCI-CHEW) 1250 (500 Ca) MG chewable tablet Chew 3 tablets by mouth 3 (three) times daily after meals.     [provider]  diphenhydrAMINE (BENADRYL) 25 MG tablet Take 50 mg by mouth daily as needed (cold symptoms).    [provider]  folic acid-vitamin b complex-vitamin c-selenium-zinc (DIALYVITE) 3 MG TABS tablet Take 1 tablet by mouth daily.    [provider]  Ibuprofen 200 MG CAPS Take 400 mg by mouth every 6 (six) hours as needed for headache or moderate pain.  01/07/17   [provider]  mirtazapine (REMERON) 15 MG tablet Take 15 mg by mouth at bedtime. 12/23/18   [provider]  oxyCODONE-acetaminophen (PERCOCET) 7.5-325 MG tablet Take 1 tablet by mouth 2 (two) times  daily as needed for moderate pain.  01/11/19   [provider]  PROAIR HFA 108 (90 Base) MCG/ACT inhaler Inhale 1 puff into the lungs every 6 (six) hours as needed. 01/25/19   [provider]  TRAVATAN Z 0.004 % SOLN ophthalmic solution Place 1 drop into both eyes daily as needed (Burning).  07/11/17   [provider]    Family History Family History  Problem Relation Age of Onset   Diabetes Father    Stroke Mother    COPD Brother    Heart attack Brother    Bronchitis Sister    Anesthesia problems Neg Hx    Hypotension Neg Hx    Malignant hyperthermia Neg Hx    Pseudochol deficiency Neg Hx     Social History Social History   Tobacco Use  Smoking status: Current Every Day Smoker    Packs/day: 0.50    Years: 48.00    Pack years: 24.00    Types: Cigarettes   Smokeless tobacco: Never Used   Tobacco comment: 6 cigarettes per day  Substance Use Topics   Alcohol use: Yes    Alcohol/week: 3.0 - 4.0 standard drinks    Types: 3 - 4 Shots of liquor per week    Comment: 3-4 shots of liquor on Fridays   Drug use: No     Allergies   Iodinated diagnostic agents, Hydrocodone, Metoprolol, and Iohexol   Review of Systems Review of Systems  Constitutional: Positive for fever.  HENT: Negative for sore throat.   Respiratory: Positive for cough and shortness of breath. Negative for wheezing.   Cardiovascular: Negative for chest pain and leg swelling.  Gastrointestinal: Positive for abdominal pain and diarrhea. Negative for nausea and vomiting.  Genitourinary: Negative for flank pain.  Musculoskeletal: Negative for back pain.  Skin: Negative for pallor and wound.  Neurological: Negative for light-headedness and headaches.     Physical Exam Updated Vital Signs BP 108/66    Pulse (!) 112    Temp 99.2 F (37.3 C) (Oral)    Resp 20    Ht 5\' 5"  (1.651 m)    Wt 54.4 kg    SpO2 100%    BMI 19.97 kg/m   Physical Exam Vitals signs and nursing  note reviewed.  Constitutional:      Appearance: Normal appearance. She is ill-appearing.     Comments: Cachectic, chronically ill appearing.   HENT:     Head: Normocephalic and atraumatic.     Mouth/Throat:     Mouth: Mucous membranes are moist.  Eyes:     Pupils: Pupils are equal, round, and reactive to light.  Neck:     Musculoskeletal: Normal range of motion and neck supple.  Cardiovascular:     Rate and Rhythm: Tachycardia present.     Comments: No BL pitting edema.  Pulmonary:     Effort: Pulmonary effort is normal.     Breath sounds: Rales present.  Abdominal:     General: Abdomen is flat.     Tenderness: There is no abdominal tenderness. There is no right CVA tenderness or left CVA tenderness.  Skin:    General: Skin is warm and dry.  Neurological:     Mental Status: She is alert and oriented to person, place, and time.      ED Treatments / Results  Labs (all labs ordered are listed, but only abnormal results are displayed) Labs Reviewed  CBC WITH DIFFERENTIAL/PLATELET - Abnormal; Notable for the following components:      Result Value   WBC 15.6 (*)    RBC 3.51 (*)    Hemoglobin 9.7 (*)    HCT 29.4 (*)    Neutro Abs 14.0 (*)    Lymphs Abs 0.3 (*)    Abs Immature Granulocytes 0.09 (*)    All other components within normal limits  COMPREHENSIVE METABOLIC PANEL - Abnormal; Notable for the following components:   Sodium 132 (*)    Chloride 90 (*)    Glucose, Bld 119 (*)    BUN 60 (*)    Creatinine, Ser 10.11 (*)    Calcium 7.8 (*)    Albumin 3.4 (*)    GFR calc non Af Amer 4 (*)    GFR calc Af Amer 4 (*)    Anion gap 19 (*)  All other components within normal limits  LACTIC ACID, PLASMA - Abnormal; Notable for the following components:   Lactic Acid, Venous 2.1 (*)    All other components within normal limits  FIBRINOGEN - Abnormal; Notable for the following components:   Fibrinogen 751 (*)    All other components within normal limits  BRAIN  NATRIURETIC PEPTIDE - Abnormal; Notable for the following components:   B Natriuretic Peptide 488.5 (*)    All other components within normal limits  APTT - Abnormal; Notable for the following components:   aPTT 43 (*)    All other components within normal limits  PROTIME-INR - Abnormal; Notable for the following components:   Prothrombin Time 15.8 (*)    INR 1.3 (*)    All other components within normal limits  SARS CORONAVIRUS 2 AG (30 MIN TAT)  CULTURE, BLOOD (ROUTINE X 2)  CULTURE, BLOOD (ROUTINE X 2)  URINE CULTURE  LACTATE DEHYDROGENASE  LACTIC ACID, PLASMA  HIV ANTIBODY (ROUTINE TESTING W REFLEX)  C-REACTIVE PROTEIN  FERRITIN  PROCALCITONIN  URINALYSIS, ROUTINE W REFLEX MICROSCOPIC  ABO/RH    EKG None  Radiology Dg Chest Portable 1 View  Result Date: 02/23/2019 CLINICAL DATA:  Shortness of breath and fever EXAM: PORTABLE CHEST 1 VIEW COMPARISON:  January 25, 2019 FINDINGS: There are bibasilar airspace opacities, left greater than right. There are likely small bilateral pleural effusions. The lungs are hyperexpanded. The heart size is enlarged. Aortic calcifications are noted. A vascular stent is noted in the right axillary region. There is no acute osseous abnormality. IMPRESSION: 1. Bibasilar airspace opacities, left greater than right, suspicious for pneumonia. 2. Probable small bilateral pleural effusions. 3. Underlying COPD. Electronically Signed   By: Constance Holster M.D.   On: 02/23/2019 19:55    Procedures Procedures (including critical care time)  Medications Ordered in ED Medications  acetaminophen (TYLENOL) tablet 650 mg (0 mg Oral Hold 02/23/19 1922)  vancomycin (VANCOCIN) IVPB 1000 mg/200 mL premix (1,000 mg Intravenous New Bag/Given 02/23/19 2111)  ceFEPIme (MAXIPIME) 1 g in sodium chloride 0.9 % 100 mL IVPB (has no administration in time range)  vancomycin (VANCOCIN) IVPB 500 mg/100 ml premix (has no administration in time range)  ceFEPIme (MAXIPIME) 2  g in sodium chloride 0.9 % 100 mL IVPB (2 g Intravenous New Bag/Given 02/23/19 2029)  ibuprofen (ADVIL) tablet 400 mg (400 mg Oral Given 02/23/19 2021)     Initial Impression / Assessment and Plan / ED Course  I have reviewed the triage vital signs and the nursing notes.  Pertinent labs & imaging results that were available during my care of the patient were reviewed by me and considered in my medical decision making (see chart for details).   Patient with a PMH of ESRD, dysrhythmia, hepatitis C presents to the ED with a chief complaint of cough, fever for the past 2 weeks.  Daughter is providing most of the history and today, she reports patient has had a T-max of 104 while at home, there is been 2 attempts for EMS to [place] for patient into the ED however patient has refused transport.  Daughter has been providing her with Tylenol around-the-clock without improvement in her fever.  She is febrile on arrival with fever 102.7, last had Tylenol prior to arrival 1000 mg.  In the ED with the lower oxygen saturation, she was 91% on room air she was placed on 2 L via nasal cannula, she does not have supplemental oxygen at home.  Patient  does have a long and history of smoking, according to daughter she continues to smoke daily cigarettes.  She has been tested for COVID-19 twice, both results were negative.  Daughter reports patient missed dialysis today, she was last dialyzed on Saturday unfortunately due to her fever she was unable to receive treatment today.  On my evaluation patient is tachycardic, hypoxic with oxygen 91% on room air, febrile to 102.7, code sepsis was activated.  She was provided with Motrin 400 mg to help with her fever.  Lungs are diminished to auscultation without any wheezing.  Abdominal soft, nontender to palpation.  She denies any nausea or vomiting, she did have diarrhea this morning, no blood in her stool.  8:25 PM  1. Bibasilar airspace opacities, left greater than right,  suspicious  for pneumonia.  2. Probable small bilateral pleural effusions.  3. Underlying COPD.     BC with a leukocytosis of 15.6, hemoglobin slightly diminished however patient is a previous history of anemia.  Lactic acid was 2.1.  CMP was remarkable for a hyponatremia, potassium is within normal limits, creatinine level along with BUN slightly elevated, however she has not been dialyzed since Saturday.  BNP is elevated today, she does not have any prior history of heart failure.  Rapid Covid swab was obtained which was negative.  9:29 PM these results were discussed at length with the daughter at the bedside, in addition to daughter on the phone call.  Her inflammation markers are elevated, although testing was negative some suspicion that there is likely coinfection of COVID-19.  We will treat patient for a hospital-acquired pneumonia as she currently frequents dialysis center.  Patient was started on antibiotics vancomycin along with cefepime to cover for HPA. Call was placed for hospitalist admission.  Patient will likely need to be transported over to this is common due to dialysis requirement.  10:05 PM Spoke top Dr. Jonelle Sidle who will admit patient for further management.  Patient hemodynamically stable at this time.   Portions of this note were generated with Lobbyist. Dictation errors may occur despite best attempts at proofreading.  Final Clinical Impressions(s) / ED Diagnoses   Final diagnoses:  Shortness of breath  HAP (hospital-acquired pneumonia)    ED Discharge Orders    None       Janeece Fitting, PA-C 02/23/19 2205    Malvin Johns, MD 02/23/19 2249

## 2019-02-23 NOTE — ED Triage Notes (Addendum)
pt c/o cough fever body aches x 2 weeks, pt missed her dialysis today

## 2019-02-23 NOTE — Sepsis Progress Note (Signed)
Notified bedside nurse of need to draw repeat lactic acid past overdue.

## 2019-02-23 NOTE — ED Notes (Signed)
Date and time results received: 02/23/19 2002 (use smartphrase ".now" to insert current time)  Test: Lactic Acid  Critical Value: 2.1  Name of Provider Notified: Dr Tamera Punt  Orders Received? Or Actions Taken?: none

## 2019-02-23 NOTE — ED Notes (Signed)
Pt on 2L [place] at this time. SpO2 97%- HR 129

## 2019-02-24 LAB — SARS CORONAVIRUS 2 (TAT 6-24 HRS): SARS Coronavirus 2: NEGATIVE

## 2019-02-24 MED ORDER — LEVOFLOXACIN 250 MG PO TABS: 250.0000 mg | ORAL_TABLET | Freq: Every day | ORAL | 0 refills | Status: DC

## 2019-02-24 MED ORDER — ZINC OXIDE 11.3 % EX CREA
TOPICAL_CREAM | Freq: Two times a day (BID) | CUTANEOUS | Status: DC
  Administered 2019-02-24: 1 via TOPICAL
  Filled 2019-02-24: qty 56

## 2019-02-24 MED ORDER — DIPHENHYDRAMINE HCL 25 MG PO CAPS
25.0000 mg | ORAL_CAPSULE | Freq: Once | ORAL | Status: AC
  Administered 2019-02-24: 25 mg via ORAL
  Filled 2019-02-24: qty 1

## 2019-02-24 NOTE — ED Provider Notes (Addendum)
Pt seen on prior shift. Admitted for pneumonia and awaiting transport. Pt now requesting discharge. I reviewed her work-up and assessed her. I explained that I agree with recommendation for admission. We took her off supplemental O2 and saturation ~90-92% in bed. I suspect would drop further with any activity. I think inpatient treatment is the most appropriate course of action. She continued to decline this. She has medical decision making capability. She received IV abx here. Will DC on levaquin for HCAP. Advised to come back to the ER if she feels symptoms are worsening or they do not begin to improve within next 24 hours. Advised that she can go to any ER if she needs to be re-evaluated but going to [facility] would be preferably if she has that option.        Virgel Manifold, MD 02/24/19 [phone]

## 2019-02-24 NOTE — ED Notes (Signed)
  Spoke with patients daughter and gave update about patient.  Was told patient is still waiting for a bed and is resting comfortably at this time.  Daughter would like to be called when patient gets a bed at other hospital.

## 2019-02-24 NOTE — Discharge Instructions (Signed)
Take the first dose of levofloxacin (antibiotic) after hemodialysis. The first dose is 500 mg (2 tablets) and then take 250 mg (1 tablet) every other day. Take it after hemodialysis on the days you have HD.

## 2019-02-24 NOTE — ED Notes (Signed)
ED Provider at bedside discussing plan of care for admission vs discharge to home.

## 2019-02-25 ENCOUNTER — Emergency Department (HOSPITAL_COMMUNITY): Payer: Medicare Other

## 2019-02-25 ENCOUNTER — Encounter (HOSPITAL_COMMUNITY): Payer: Self-pay | Admitting: Emergency Medicine

## 2019-02-25 ENCOUNTER — Inpatient Hospital Stay (HOSPITAL_COMMUNITY)
Admission: EM | Admit: 2019-02-25 | Discharge: 2019-03-04 | DRG: 193 | Disposition: A | Discharge status: Home / Self Care | Payer: Medicare Other | Attending: Internal Medicine | Admitting: Internal Medicine

## 2019-02-25 DIAGNOSIS — I120 Hypertensive chronic kidney disease with stage 5 chronic kidney disease or end stage renal disease: Secondary | ICD-10-CM | POA: Diagnosis present

## 2019-02-25 DIAGNOSIS — F17210 Nicotine dependence, cigarettes, uncomplicated: Secondary | ICD-10-CM | POA: Diagnosis present

## 2019-02-25 DIAGNOSIS — Z681 Body mass index (BMI) 19 or less, adult: Secondary | ICD-10-CM

## 2019-02-25 DIAGNOSIS — I959 Hypotension, unspecified: Secondary | ICD-10-CM | POA: Diagnosis not present

## 2019-02-25 DIAGNOSIS — I350 Nonrheumatic aortic (valve) stenosis: Secondary | ICD-10-CM | POA: Diagnosis present

## 2019-02-25 DIAGNOSIS — Z992 Dependence on renal dialysis: Secondary | ICD-10-CM

## 2019-02-25 DIAGNOSIS — Z823 Family history of stroke: Secondary | ICD-10-CM

## 2019-02-25 DIAGNOSIS — Z7189 Other specified counseling: Secondary | ICD-10-CM | POA: Diagnosis not present

## 2019-02-25 DIAGNOSIS — Z9049 Acquired absence of other specified parts of digestive tract: Secondary | ICD-10-CM

## 2019-02-25 DIAGNOSIS — Z515 Encounter for palliative care: Secondary | ICD-10-CM | POA: Diagnosis not present

## 2019-02-25 DIAGNOSIS — R627 Adult failure to thrive: Secondary | ICD-10-CM | POA: Diagnosis present

## 2019-02-25 DIAGNOSIS — R64 Cachexia: Secondary | ICD-10-CM | POA: Diagnosis present

## 2019-02-25 DIAGNOSIS — G92 Toxic encephalopathy: Secondary | ICD-10-CM | POA: Diagnosis not present

## 2019-02-25 DIAGNOSIS — I10 Essential (primary) hypertension: Secondary | ICD-10-CM | POA: Diagnosis present

## 2019-02-25 DIAGNOSIS — Z885 Allergy status to narcotic agent status: Secondary | ICD-10-CM

## 2019-02-25 DIAGNOSIS — M21372 Foot drop, left foot: Secondary | ICD-10-CM | POA: Diagnosis present

## 2019-02-25 DIAGNOSIS — G8929 Other chronic pain: Secondary | ICD-10-CM | POA: Diagnosis present

## 2019-02-25 DIAGNOSIS — G9341 Metabolic encephalopathy: Secondary | ICD-10-CM

## 2019-02-25 DIAGNOSIS — Z833 Family history of diabetes mellitus: Secondary | ICD-10-CM | POA: Diagnosis not present

## 2019-02-25 DIAGNOSIS — E875 Hyperkalemia: Secondary | ICD-10-CM | POA: Diagnosis not present

## 2019-02-25 DIAGNOSIS — Z91041 Radiographic dye allergy status: Secondary | ICD-10-CM

## 2019-02-25 DIAGNOSIS — B1920 Unspecified viral hepatitis C without hepatic coma: Secondary | ICD-10-CM | POA: Diagnosis present

## 2019-02-25 DIAGNOSIS — D631 Anemia in chronic kidney disease: Secondary | ICD-10-CM | POA: Diagnosis present

## 2019-02-25 DIAGNOSIS — J189 Pneumonia, unspecified organism: Secondary | ICD-10-CM | POA: Diagnosis present

## 2019-02-25 DIAGNOSIS — N19 Unspecified kidney failure: Secondary | ICD-10-CM | POA: Diagnosis not present

## 2019-02-25 DIAGNOSIS — M21371 Foot drop, right foot: Secondary | ICD-10-CM | POA: Diagnosis present

## 2019-02-25 DIAGNOSIS — J9601 Acute respiratory failure with hypoxia: Secondary | ICD-10-CM | POA: Diagnosis present

## 2019-02-25 DIAGNOSIS — R392 Extrarenal uremia: Secondary | ICD-10-CM | POA: Diagnosis present

## 2019-02-25 DIAGNOSIS — Z888 Allergy status to other drugs, medicaments and biological substances status: Secondary | ICD-10-CM

## 2019-02-25 DIAGNOSIS — T402X5A Adverse effect of other opioids, initial encounter: Secondary | ICD-10-CM | POA: Diagnosis present

## 2019-02-25 DIAGNOSIS — J440 Chronic obstructive pulmonary disease with acute lower respiratory infection: Secondary | ICD-10-CM | POA: Diagnosis present

## 2019-02-25 DIAGNOSIS — R1310 Dysphagia, unspecified: Secondary | ICD-10-CM | POA: Diagnosis present

## 2019-02-25 DIAGNOSIS — Z8249 Family history of ischemic heart disease and other diseases of the circulatory system: Secondary | ICD-10-CM | POA: Diagnosis not present

## 2019-02-25 DIAGNOSIS — N2581 Secondary hyperparathyroidism of renal origin: Secondary | ICD-10-CM | POA: Diagnosis present

## 2019-02-25 DIAGNOSIS — M4726 Other spondylosis with radiculopathy, lumbar region: Secondary | ICD-10-CM | POA: Diagnosis present

## 2019-02-25 DIAGNOSIS — E1122 Type 2 diabetes mellitus with diabetic chronic kidney disease: Secondary | ICD-10-CM | POA: Diagnosis present

## 2019-02-25 DIAGNOSIS — N186 End stage renal disease: Secondary | ICD-10-CM | POA: Diagnosis present

## 2019-02-25 DIAGNOSIS — Z825 Family history of asthma and other chronic lower respiratory diseases: Secondary | ICD-10-CM

## 2019-02-25 DIAGNOSIS — F0390 Unspecified dementia without behavioral disturbance: Secondary | ICD-10-CM | POA: Diagnosis present

## 2019-02-25 DIAGNOSIS — R05 Cough: Secondary | ICD-10-CM | POA: Diagnosis present

## 2019-02-25 DIAGNOSIS — Z9115 Patient's noncompliance with renal dialysis: Secondary | ICD-10-CM

## 2019-02-25 DIAGNOSIS — Z20828 Contact with and (suspected) exposure to other viral communicable diseases: Secondary | ICD-10-CM | POA: Diagnosis present

## 2019-02-25 DIAGNOSIS — R4182 Altered mental status, unspecified: Secondary | ICD-10-CM | POA: Diagnosis not present

## 2019-02-25 DIAGNOSIS — Y95 Nosocomial condition: Secondary | ICD-10-CM | POA: Diagnosis present

## 2019-02-25 DIAGNOSIS — R569 Unspecified convulsions: Secondary | ICD-10-CM | POA: Diagnosis present

## 2019-02-25 DIAGNOSIS — M898X9 Other specified disorders of bone, unspecified site: Secondary | ICD-10-CM | POA: Diagnosis present

## 2019-02-25 DIAGNOSIS — E11649 Type 2 diabetes mellitus with hypoglycemia without coma: Secondary | ICD-10-CM | POA: Diagnosis not present

## 2019-02-25 LAB — CBC WITH DIFFERENTIAL/PLATELET
Abs Immature Granulocytes: 0.09 10*3/uL — ABNORMAL HIGH (ref 0.00–0.07)
Basophils Absolute: 0.1 10*3/uL (ref 0.0–0.1)
Basophils Relative: 0 %
Eosinophils Absolute: 0.6 10*3/uL — ABNORMAL HIGH (ref 0.0–0.5)
Eosinophils Relative: 3 %
HCT: 30.6 % — ABNORMAL LOW (ref 36.0–46.0)
Hemoglobin: 10.1 g/dL — ABNORMAL LOW (ref 12.0–15.0)
Immature Granulocytes: 1 %
Lymphocytes Relative: 9 %
Lymphs Abs: 1.7 10*3/uL (ref 0.7–4.0)
MCH: 27.2 pg (ref 26.0–34.0)
MCHC: 33 g/dL (ref 30.0–36.0)
MCV: 82.3 fL (ref 80.0–100.0)
Monocytes Absolute: 1.2 10*3/uL — ABNORMAL HIGH (ref 0.1–1.0)
Monocytes Relative: 6 %
Neutro Abs: 15.4 10*3/uL — ABNORMAL HIGH (ref 1.7–7.7)
Neutrophils Relative %: 81 %
Platelets: 359 10*3/uL (ref 150–400)
RBC: 3.72 MIL/uL — ABNORMAL LOW (ref 3.87–5.11)
RDW: 15.8 % — ABNORMAL HIGH (ref 11.5–15.5)
WBC: 19 10*3/uL — ABNORMAL HIGH (ref 4.0–10.5)
nRBC: 0 % (ref 0.0–0.2)

## 2019-02-25 LAB — RESPIRATORY PANEL BY RT PCR (FLU A&B, COVID)
Influenza A by PCR: NEGATIVE
Influenza B by PCR: NEGATIVE
SARS Coronavirus 2 by RT PCR: NEGATIVE

## 2019-02-25 LAB — PHOSPHORUS: Phosphorus: 4.3 mg/dL (ref 2.5–4.6)

## 2019-02-25 LAB — COMPREHENSIVE METABOLIC PANEL
ALT: 15 U/L (ref 0–44)
AST: 25 U/L (ref 15–41)
Albumin: 3.1 g/dL — ABNORMAL LOW (ref 3.5–5.0)
Alkaline Phosphatase: 95 U/L (ref 38–126)
Anion gap: 23 — ABNORMAL HIGH (ref 5–15)
BUN: 79 mg/dL — ABNORMAL HIGH (ref 8–23)
CO2: 20 mmol/L — ABNORMAL LOW (ref 22–32)
Calcium: 8 mg/dL — ABNORMAL LOW (ref 8.9–10.3)
Chloride: 93 mmol/L — ABNORMAL LOW (ref 98–111)
Creatinine, Ser: 11.91 mg/dL — ABNORMAL HIGH (ref 0.44–1.00)
GFR calc Af Amer: 3 mL/min — ABNORMAL LOW (ref >60)
GFR calc non Af Amer: 3 mL/min — ABNORMAL LOW (ref >60)
Glucose, Bld: 97 mg/dL (ref 70–99)
Potassium: 5.8 mmol/L — ABNORMAL HIGH (ref 3.5–5.1)
Sodium: 136 mmol/L (ref 135–145)
Total Bilirubin: 1.5 mg/dL — ABNORMAL HIGH (ref 0.3–1.2)
Total Protein: 8.1 g/dL (ref 6.5–8.1)

## 2019-02-25 LAB — MAGNESIUM: Magnesium: 2.3 mg/dL (ref 1.7–2.4)

## 2019-02-25 LAB — HIV ANTIBODY (ROUTINE TESTING W REFLEX): HIV Screen 4th Generation wRfx: NONREACTIVE

## 2019-02-25 MED ORDER — CHLORHEXIDINE GLUCONATE CLOTH 2 % EX PADS
6.0000 | MEDICATED_PAD | Freq: Every day | CUTANEOUS | Status: DC
  Administered 2019-02-26 – 2019-03-02 (×5): 6 via TOPICAL

## 2019-02-25 MED ORDER — SODIUM CHLORIDE 0.9% FLUSH: 3.0000 mL | INTRAVENOUS | Status: DC | PRN

## 2019-02-25 MED ORDER — SODIUM CHLORIDE 0.9 % IV SOLN: 100.0000 mL | INTRAVENOUS | Status: DC | PRN

## 2019-02-25 MED ORDER — PENTAFLUOROPROP-TETRAFLUOROETH EX AERO: 1.0000 "application " | INHALATION_SPRAY | CUTANEOUS | Status: DC | PRN

## 2019-02-25 MED ORDER — RENA-VITE PO TABS
1.0000 | ORAL_TABLET | Freq: Every day | ORAL | Status: DC
  Administered 2019-03-02 – 2019-03-04 (×3): 1 via ORAL
  Filled 2019-02-25 (×4): qty 1

## 2019-02-25 MED ORDER — DOCUSATE SODIUM 283 MG RE ENEM
1.0000 | ENEMA | RECTAL | Status: DC | PRN
  Filled 2019-02-25: qty 1

## 2019-02-25 MED ORDER — ACETAMINOPHEN 325 MG PO TABS: 650.0000 mg | ORAL_TABLET | Freq: Four times a day (QID) | ORAL | Status: DC | PRN

## 2019-02-25 MED ORDER — ACETAMINOPHEN 650 MG RE SUPP
650.0000 mg | Freq: Four times a day (QID) | RECTAL | Status: DC | PRN
  Administered 2019-02-27: 650 mg via RECTAL
  Filled 2019-02-25: qty 1

## 2019-02-25 MED ORDER — LIDOCAINE-PRILOCAINE 2.5-2.5 % EX CREA: 1.0000 "application " | TOPICAL_CREAM | CUTANEOUS | Status: DC | PRN

## 2019-02-25 MED ORDER — VANCOMYCIN VARIABLE DOSE PER UNSTABLE RENAL FUNCTION (PHARMACIST DOSING): Status: DC

## 2019-02-25 MED ORDER — HEPARIN SODIUM (PORCINE) 5000 UNIT/ML IJ SOLN
5000.0000 [IU] | Freq: Three times a day (TID) | INTRAMUSCULAR | Status: DC
  Administered 2019-02-26 – 2019-03-04 (×20): 5000 [IU] via SUBCUTANEOUS
  Filled 2019-02-25 (×20): qty 1

## 2019-02-25 MED ORDER — VANCOMYCIN HCL 10 G IV SOLR
1250.0000 mg | Freq: Once | INTRAVENOUS | Status: AC
  Administered 2019-02-25: 12:00:00 1250 mg via INTRAVENOUS
  Filled 2019-02-25: qty 1250

## 2019-02-25 MED ORDER — DEXTROSE 50 % IV SOLN
INTRAVENOUS | Status: AC
  Administered 2019-02-25: 12.5 g via INTRAVENOUS
  Filled 2019-02-25: qty 50

## 2019-02-25 MED ORDER — ALBUTEROL SULFATE HFA 108 (90 BASE) MCG/ACT IN AERS
1.0000 | INHALATION_SPRAY | RESPIRATORY_TRACT | Status: DC | PRN
  Filled 2019-02-25: qty 6.7

## 2019-02-25 MED ORDER — OXYCODONE-ACETAMINOPHEN 7.5-325 MG PO TABS: 1.0000 | ORAL_TABLET | Freq: Two times a day (BID) | ORAL | Status: DC | PRN

## 2019-02-25 MED ORDER — NEPRO/CARBSTEADY PO LIQD
237.0000 mL | Freq: Three times a day (TID) | ORAL | Status: DC | PRN
  Filled 2019-02-25: qty 237

## 2019-02-25 MED ORDER — LIDOCAINE HCL (PF) 1 % IJ SOLN: 5.0000 mL | INTRAMUSCULAR | Status: DC | PRN

## 2019-02-25 MED ORDER — SORBITOL 70 % SOLN
30.0000 mL | Status: DC | PRN
  Filled 2019-02-25: qty 30

## 2019-02-25 MED ORDER — HEPARIN SODIUM (PORCINE) 1000 UNIT/ML DIALYSIS: 1000.0000 [IU] | INTRAMUSCULAR | Status: DC | PRN

## 2019-02-25 MED ORDER — SODIUM CHLORIDE 0.9 % IV SOLN
2.0000 g | Freq: Once | INTRAVENOUS | Status: AC
  Administered 2019-02-25: 12:00:00 2 g via INTRAVENOUS
  Filled 2019-02-25: qty 2

## 2019-02-25 MED ORDER — MIRTAZAPINE 15 MG PO TABS
15.0000 mg | ORAL_TABLET | Freq: Every day | ORAL | Status: DC
  Filled 2019-02-25: qty 1

## 2019-02-25 MED ORDER — ONDANSETRON HCL 4 MG PO TABS: 4.0000 mg | ORAL_TABLET | Freq: Four times a day (QID) | ORAL | Status: DC | PRN

## 2019-02-25 MED ORDER — ONDANSETRON HCL 4 MG/2ML IJ SOLN: 4.0000 mg | Freq: Four times a day (QID) | INTRAMUSCULAR | Status: DC | PRN

## 2019-02-25 MED ORDER — ZOLPIDEM TARTRATE 5 MG PO TABS: 5.0000 mg | ORAL_TABLET | Freq: Every evening | ORAL | Status: DC | PRN

## 2019-02-25 MED ORDER — CAMPHOR-MENTHOL 0.5-0.5 % EX LOTN
1.0000 "application " | TOPICAL_LOTION | Freq: Three times a day (TID) | CUTANEOUS | Status: DC | PRN
  Filled 2019-02-25: qty 222

## 2019-02-25 MED ORDER — SODIUM CHLORIDE 0.9 % IV SOLN: 250.0000 mL | INTRAVENOUS | Status: DC | PRN

## 2019-02-25 MED ORDER — SODIUM CHLORIDE 0.9 % IV SOLN
1.0000 g | INTRAVENOUS | Status: DC
  Administered 2019-02-26: 1 g via INTRAVENOUS
  Filled 2019-02-25 (×2): qty 1

## 2019-02-25 MED ORDER — HYDROXYZINE HCL 25 MG PO TABS: 25.0000 mg | ORAL_TABLET | Freq: Three times a day (TID) | ORAL | Status: DC | PRN

## 2019-02-25 MED ORDER — CALCIUM CARBONATE ANTACID 1250 MG/5ML PO SUSP
500.0000 mg | Freq: Four times a day (QID) | ORAL | Status: DC | PRN
  Filled 2019-02-25: qty 5

## 2019-02-25 MED ORDER — SODIUM CHLORIDE 0.9% FLUSH
3.0000 mL | Freq: Two times a day (BID) | INTRAVENOUS | Status: DC
  Administered 2019-02-26 – 2019-03-04 (×11): 3 mL via INTRAVENOUS

## 2019-02-25 NOTE — ED Notes (Signed)
Pt is sinus tach on monitor 

## 2019-02-25 NOTE — H&P (Signed)
History and Physical    [name] [id number] DOB: 1950-07-24 DOA: 02/25/2019  PCP: Janie Morning, DO Consultants:  [facility] - vascular; nephrology Patient coming from:  Home; NOK: Cindra Eves, daughter, [phone]  Chief Complaint: SOB  HPI: [name] is a 68 y.o. female with medical history significant of seizures (due to HTN x 1 in 1990s); HTN; hep C; ESRD on TTS HD; DM; ischemic colitis s/p hemicolectomy and ileostomy; and brain aneurysm presenting with SOB.  She was seen on 12/8 for PNA at [facility].  Admission was planned but the patient eventually decided she did not want to wait for transport.  She eventually requested outpatient evaluation and was treated with Levaquin.  She developed worsening SOB and fatigue.  She was mildly confused at the time of my evaluation but was able to have a conversation (somewhat nonsensical).  Throughout her ER stay while awaiting admission, she has become increasingly confused and less responsive but has remained hemodynamically stable.    ED Course:  To be admitted 2 days ago at [facility], discharged per her request yesterday.  Didn't go to HD yesterday, now worse.  Tachycardia, tachypnea, no hypoxia.  HCAP vs. Need for HD.  Nephrology consult pending.  2 negative COVID tests <36 hours ago.  Rapid respiratory test pending.    Review of Systems: Unable to perform  Past Medical History:  Diagnosis Date   Anemia    anemia of chronic disease likely 2/2 ESRD per last anemia panel (02/2011) with Fe 35, TIBC 167, ferritin 2041  // BL Hgb 8-10   Anuria    Due to dialysis   Aortic stenosis, mild    03/2011 echo   Arthritis    Back   Blood transfusion 1990's   r/t Kidney removal surgery   Brain aneurysm    No records could be found   Colitis, ischemic ([place]) 01/2011   S/P partial colectomy of right hemicolon and ileostomy placement   Diabetes mellitus    Borderline   Dialysis patient [facility])    [place] Tues,Thursday,  saturday   Dysrhythmia    SVT for brief period in Feb 2013   End stage renal disease on dialysis [facility])    Secondary to hypertension // T/Th/Sat dialysis on [employer]   Foot drop, bilateral    after back surgery   Heart murmur    Born with heart mumur, does not require follow up per pt   Hepatitis C    Hypertension    Does not see a heart doctor, had pre transplant stress test at [facility]     Lumbar spinal stenosis     bilateral L4-5 and L5-S1 posterior lumbar interbody arthrodesis and bilateral L4-S1 posterior lateral arthrodesis for lumbar stenosis dynamic lumbar spondylolisthesis lumbar spondylosis lumbar radiculopathy with foot drop // s/p surgery 01/2011 - see surgery section for details.   Seizures ([place])    r/t HTN in [id number] x 1   Umbilical hernia age 71    Past Surgical History:  Procedure Laterality Date   A/V FISTULAGRAM Right 02/05/2019   Procedure: A/V FISTULAGRAM;  Surgeon: Katha Cabal, MD;  Location: [place] CV [facility];  Service: Cardiovascular;  Laterality: Right;   APPENDECTOMY  1960's   AV FISTULA PLACEMENT  04/08/2011   Procedure: INSERTION OF ARTERIOVENOUS (AV) GORE-TEX GRAFT ARM;  Surgeon: Rosetta Posner, MD;  Location: [place];  Service: Vascular;  Laterality: Right;  Revision of Upper Arm Gore-Tex Graft   BACK SURGERY  01/30/2011  L4-S1 lumbar laminectomy facetectomy and foraminotomies for decompression a bilateral L4-5 and L5-S1 posterior lumbar interbody arthrodesis and bilateral L4-S1 posterior lateral arthrodesis for lumbar stenosis dynamic lumbar spondylolisthesis lumbar spondylosis lumbar radiculopathy with foot drop   COLONOSCOPY  02/19/2011   Procedure: COLONOSCOPY;  Surgeon: Missy Sabins, MD;  Location: [place];  Service: Endoscopy;  Laterality: N/A;   COLONOSCOPY  03/07/2011   Procedure: COLONOSCOPY;  Surgeon: Cleotis Nipper, MD;  Location: [facility] ENDOSCOPY;  Service: Endoscopy;  Laterality: N/A;  no prep needed--pt has long  Hartmann's pouch and ileostomy   COLOSTOMY  02/09/2011   Reason for surgery: ischemic colitis of right hemicolon requiring partial colectomy and ileostomy.  Surgeon: Harl Bowie, MD;  Location: [place];  Service: General;  Laterality: Right;   ILEOSTOMY CLOSURE  05/29/2011   Procedure: ILEOSTOMY TAKEDOWN;  Surgeon: Harl Bowie, MD;  Location: [place];  Service: General;  Laterality: N/A;   INSERTION OF DIALYSIS CATHETER Right 04/15/2016   Procedure: INSERTION OF DIALYSIS CATHETER RIGHT INTERNAL JUGULAR;  Surgeon: Conrad [place], MD;  Location: [place];  Service: Vascular;  Laterality: Right;   LAPAROTOMY  02/09/2011   Procedure: EXPLORATORY LAPAROTOMY;  Surgeon: Harl Bowie, MD;  Location: [place];  Service: General;  Laterality: N/A;   NEPHRECTOMY  2010   right side done at [facility] on transplant list   PARTIAL COLECTOMY  02/09/2011   Reason for surgery: ischemic colitis of right hemicolon; Surgeon: Harl Bowie, MD;  Location: [place];  Service: General;  Laterality: Right;   REVISION OF ARTERIOVENOUS GORETEX GRAFT Right 04/15/2016   Procedure: REVISION OF ARTERIOVENOUS GORETEX GRAFT USING 52mmX20cm GORETEX GRAFT;  Surgeon: Conrad Swan, MD;  Location: [place];  Service: Vascular;  Laterality: Right;   RIB RESECTION     d/t kidney removal   TONSILLECTOMY     as a child   total parathyroidectomy  01/17/2001   with autotransplantation into left forearm // due to secondary hyperparathyroidism from ESRD   [place]  age 61- 8   UPPER EXTREMITY ANGIOGRAPHY Right 07/21/2017   Procedure: UPPER EXTREMITY ANGIOGRAPHY;  Surgeon: Waynetta Sandy, MD;  Location: [place] CV [facility];  Service: Cardiovascular;  Laterality: Right;   VASCULAR SURGERY     right arm dialysis graft    Social History   Socioeconomic History   Marital status: Legally Separated    Spouse name: Not on file   Number of children: 2   Years of education: 10th grade   Highest  education level: Not on file  Occupational History   Occupation: Retired    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 Has 2 On HD since for 20 years. Worked as a Sports coach. Went to 10th grade. Can read and write.  Social Determinants of Health   Financial Resource Strain:    Difficulty of Paying Living Expenses: Not on file  Food Insecurity:    Worried About Charity fundraiser in the Last Year: Not on file   [employer] of Food in the Last Year: Not on file  Transportation Needs:    Lack of Transportation (Medical): Not on file   Lack of Transportation (Non-Medical): Not on file  Physical Activity:    Days of Exercise per Week: Not on file   Minutes of Exercise per Session: Not on file  Stress:    Feeling of Stress : Not on file  Social Connections:    Frequency of Communication with Friends and Family: Not on file   Frequency of Social Gatherings with Friends and Family: Not on file   Attends Religious Services: Not on file   Active Member of Clubs or Organizations: Not on file   Attends Archivist Meetings: Not on file   Marital Status: Not on file  Intimate Partner Violence:    Fear of Current or Ex-Partner: Not on file   Emotionally Abused: Not on file   Physically Abused: Not on file   Sexually Abused: Not on file    Allergies  Allergen Reactions   Iodinated Diagnostic Agents Other  (See Comments)    Seziure Other reaction(s): Other (See Comments) Seziure    Hydrocodone Nausea And Vomiting   Metoprolol Nausea And Vomiting   Iohexol Other (See Comments)    Reaction is convulsions    Family History  Problem Relation Age of Onset   Diabetes Father    Stroke Mother    COPD Brother    Heart attack Brother    Bronchitis Sister    Anesthesia problems Neg Hx    Hypotension Neg Hx    Malignant hyperthermia Neg Hx    Pseudochol deficiency Neg Hx     Prior to Admission medications   Medication Sig Start Date End Date Taking? Authorizing Provider  benzonatate (TESSALON PERLES) 100 MG capsule Take 100 mg by mouth daily as needed for cough.  02/17/18   [provider]  calcium carbonate (CALCI-CHEW) 1250 (500 Ca) MG chewable tablet Chew 3 tablets by mouth 3 (three) times daily after meals.     [provider]  diphenhydrAMINE (BENADRYL) 25 MG tablet Take 50 mg by mouth daily as needed (cold symptoms).    [provider]  folic acid-vitamin b complex-vitamin c-selenium-zinc (DIALYVITE) 3 MG TABS tablet Take 1 tablet by mouth daily.    [provider]  Ibuprofen 200 MG CAPS Take 400 mg by mouth every 6 (six) hours as needed for headache or moderate pain.  01/07/17   [provider]  levofloxacin (LEVAQUIN) 250 MG tablet Take 1 tablet (250 mg total) by mouth daily. Take 500 mg (two tablets) as first dose after hemodialysis and then 250 mg (one tablet) every other day. Take after dialysis on dialysis days. 02/24/19   Virgel Manifold, MD  mirtazapine (REMERON) 15 MG tablet Take 15 mg by mouth at bedtime. 12/23/18   [provider]  oxyCODONE-acetaminophen (PERCOCET) 7.5-325 MG tablet Take 1 tablet by mouth 2 (two) times daily as needed for moderate pain.  01/11/19   [provider]  PROAIR HFA 108 (90 Base) MCG/ACT inhaler Inhale 1 puff into the lungs every 6 (six) hours as needed. 01/25/19   [provider]  TRAVATAN Z 0.004 % SOLN ophthalmic solution Place 1 drop into  both eyes daily as needed (Burning).  07/11/17   [provider]    Physical Exam: Vitals:   02/25/19 1130 02/25/19 1145 02/25/19 1200 02/25/19 1300  BP: 138/74 (!) 145/85 135/77 (!) 144/77  Pulse:   (!) 112 (!) 101  Resp: 13 11 16 15   Temp:      TempSrc:      SpO2:   100% 100%      General:  Appeared confused but alert and appropriate initially; now with clearly AMS and stupor  Eyes:  PERRL, EOMI, normal lids, iris  ENT:  grossly normal hearing, lips & tongue, mmm  Neck:  no LAD, masses or thyromegaly  Cardiovascular:  RR with mild tachycardia, no r/g, loud 4/6 systolic murmur. No LE edema.   Respiratory:   CTA bilaterally with no wheezes/rales/rhonchi.  Mildly increased respiratory effort.  Abdomen:  soft, NT, ND, NABS  Skin:  no rash or induration seen on limited exam  Musculoskeletal:  grossly normal tone BUE/BLE, good ROM, no bony abnormality  Psychiatric: initially blunt mood and affect, speech mildly inappropriate, AOx1-2; now somnolent, stuporous  Neurologic:  Unable to effectively perform    Radiological Exams on Admission: DG Chest Portable 1 View  Result Date: 02/25/2019 CLINICAL DATA:  Dyspnea.  Altered mental status. EXAM: PORTABLE CHEST 1 VIEW COMPARISON:  Single-view of the chest 02/23/2019. PA and lateral chest 01/25/2019. FINDINGS: Bibasilar airspace disease seen on the most recent comparison has almost completely resolved. Subtle opacities in the right mid lung zone are unchanged. No pneumothorax or pleural fluid. Heart size is normal. Atherosclerosis and a vascular stent in the right arm are noted. IMPRESSION: Almost complete resolution of bibasilar airspace disease since the most recent study. Subtle opacities in the right mid and lower lung zone could be due to atelectasis or pneumonia and are unchanged. Electronically Signed   By: Inge Rise M.D.   On:  02/25/2019 09:05   DG Chest Portable 1 View  Result Date: 02/23/2019 CLINICAL DATA:  Shortness of breath and fever EXAM: PORTABLE CHEST 1 VIEW COMPARISON:  January 25, 2019 FINDINGS: There are bibasilar airspace opacities, left greater than right. There are likely small bilateral pleural effusions. The lungs are hyperexpanded. The heart size is enlarged. Aortic calcifications are noted. A vascular stent is noted in the right axillary region. There is no acute osseous abnormality. IMPRESSION: 1. Bibasilar airspace opacities, left greater than right, suspicious for pneumonia. 2. Probable small bilateral pleural effusions. 3. Underlying COPD. Electronically Signed   By: Constance Holster M.D.   On: 02/23/2019 19:55    EKG: Independently reviewed.  Sinus tachycardia with rate 134; prolonged QTc 550; LVH; no evidence of acute ischemia   Labs on Admission: I have personally reviewed the available labs and imaging studies at the time of the admission.  Pertinent labs:   K+ 5.8 CO2 20 BUN 79/Creatinine 11.91/GFR 3 Anion gap 23 Albumin 3.1 Lactate 2.1 WBC 19.0 Hgb 10.1 COVID negative 12/8 x 2   Assessment/Plan Principal Problem:   Uremia Active Problems:   End stage renal disease on dialysis ([place])   Hypertension   Pneumonia   ESRD on HD, with uremia -Patient has been the ER throughout the day awaiting admission and HD with worsening confusion/AMS -Strongly suspect that this is associated with uremia in the setting of missed HD since Saturday -Nephrology is aware and plans to take the patient to HD as soon as a bed is available -She remains hemodynamically stable -If her MS does  not improve post-HD, consider head CT and/or MRI for further evaluation -Nephrology prn order set was used -She appears likely to need serial HD and so she was admitted to inpatient status  HCAP -Patient was previously diagnosed with pneumonia on 12/8 and has received Levaquin -She presented today with SOB  but evaluation was more c/w volume overload and need for HD than PNA -Will admit for ongoing PNA treatment and possible failure of outpatient therapy -Given Cefepime and Vanc in the ER, will continue -She has had repeated negative COVID-19 tests and so is not thought to be a PUI at this time -Continue Albuterol      Note: This patient has been tested and is negative for the novel coronavirus COVID-19.  DVT prophylaxis: Heparin Code Status:  Full - confirmed with family (patient was unable to answer this question at the time of initial evaluation) Family Communication: Daughter was present throughout evaluation Disposition Plan:  Home once clinically improved Consults called: Nephrology  Admission status: Admit - It is my clinical opinion that admission to INPATIENT is reasonable and necessary because of the expectation that this patient will require hospital care that crosses at least 2 midnights to treat this condition based on the medical complexity of the problems presented.  Given the aforementioned information, the predictability of an adverse outcome is felt to be significant.    Karmen Bongo MD [facility]   How to contact the [facility] Attending or Consulting provider [place] or covering provider during after hours [place], for this patient?  1. Check the care team in [facility] and look for a) attending/consulting TR[facility] provider listed and b) the [facility] team listed 2. Log into [email or web address] and use [place]'s universal password to access. If you do not have the password, please contact the hospital operator. 3. Locate the [facility] provider you are looking for under [facility] and page to a number that you can be directly reached. 4. If you still have difficulty reaching the provider, please page the [facility] (Director on Call) for the Hospitalists listed on amion for assistance.   02/25/2019, 5:27 PM

## 2019-02-25 NOTE — ED Triage Notes (Signed)
Pt arrives to ED with [facility] with c/o of shortness of breath- pt has been recently diagnosed with pneumonia and due to not feeling well has not had a dialysis treatment since last thursday. Pt states she has had a recent covid  test and was negative.

## 2019-02-25 NOTE — ED Notes (Signed)
Pt given ice.  

## 2019-02-25 NOTE — ED Notes (Signed)
Pt's daughter [name] and can be reached at [phone] with any information.

## 2019-02-25 NOTE — ED Notes (Signed)
ED Provider at bedside. 

## 2019-02-25 NOTE — ED Provider Notes (Addendum)
[facility] EMERGENCY DEPARTMENT Provider Note   CSN: [id number] Arrival date & time: 02/25/19  0800     History Chief Complaint  Patient presents with  . Shortness of Breath    [name] is a 68 y.o. female.  HPI   68 year old female with increasing fatigue and dyspnea.  She was  seen in the emergency room on the evening of 02/23/2019.  She was diagnosed with pneumonia and initial plan was for admission. She stayed in the ER overnight awaiting transport once a bed became available. I actually discharged her yesterday morning from the ER at [facility] at her request although I too recommended admission.  She states that she was feeling relatively okay throughout the day yesterday.  Today she woke up to go to dialysis and today she just felt "bad."  She is not sure the last time she had dialysis.  Per last ED note, it was 02/20/19.  She received a dose of cefepime and vancomycin on 12/8.  She was discharged with a prescription for Levaquin but states that she did not start that yet.  Past Medical History:  Diagnosis Date  . Anemia    anemia of chronic disease likely 2/2 ESRD per last anemia panel (02/2011) with Fe 35, TIBC 167, ferritin 2041  // BL Hgb 8-10  . Anuria    Due to dialysis  . Aortic stenosis, mild    03/2011 echo  . Arthritis    Back  . Blood transfusion 1990's   r/t Kidney removal surgery  . Brain aneurysm    No records could be found  . Colitis, ischemic ([place]) 01/2011   S/P partial colectomy of right hemicolon and ileostomy placement  . Diabetes mellitus    Borderline  . Dialysis patient [facility])    [place] Tues,Thursday, saturday  . Dysrhythmia    SVT for brief period in Feb 2013  . End stage renal disease on dialysis [facility])    Secondary to hypertension // T/Th/Sat dialysis on [employer]  . Foot drop, bilateral    after back surgery  . Heart murmur    Born with heart mumur, does not require follow up per pt  . Hepatitis C   .  Hypertension    Does not see a heart doctor, had pre transplant stress test at [facility]    . Lumbar spinal stenosis     bilateral L4-5 and L5-S1 posterior lumbar interbody arthrodesis and bilateral L4-S1 posterior lateral arthrodesis for lumbar stenosis dynamic lumbar spondylolisthesis lumbar spondylosis lumbar radiculopathy with foot drop // s/p surgery 01/2011 - see surgery section for details.  . Seizures ([place])    r/t HTN in 1990's x 1  . Umbilical hernia age 79    Patient Active Problem List   Diagnosis Date Noted  . HCAP (healthcare-associated pneumonia) 02/23/2019  . Complication of vascular access for dialysis 01/17/2019  . Rheumatoid arthritis ([place]) 11/16/2018  . Complication of vascular dialysis catheter 04/15/2016  . At high risk for falls 05/19/2015  . Bilateral foot-drop 05/19/2015  . History of spontaneous subarachnoid intracranial hemorrhage due to cerebral aneurysm 05/19/2015  . Tobacco dependence 05/19/2015  . Underimmunization status 05/06/2015  . Headache 12/08/2013  . Fever, unspecified 12/08/2013  . Pruritus, unspecified 12/08/2013  . Diarrhea, unspecified 12/02/2013  . Pain, unspecified 12/02/2013  . Coagulation defect, unspecified ([place]) 11/29/2013  . Muscle weakness (generalized) 05/15/2012  . Abnormality of gait 05/15/2012  . Other acquired deformity of ankle and foot(736.79) 05/15/2012  .  Iron deficiency anemia, unspecified 02/17/2012  . Lumbar radiculitis 01/17/2012  . Post herpetic neuralgia 11/25/2011  . Leg pain, right 10/14/2011  . Valtrex overdose 10/01/2011  . Shingles rash 09/30/2011  . Body mass index (BMI) less than or equal to 19 in adult 07/10/2011  . Unspecified protein-calorie malnutrition ([place]) 06/12/2011  . Preop cardiovascular exam 05/28/2011  . Sinus tachycardia 04/23/2011  . Aortic stenosis 04/23/2011  . Ileostomy in place [facility]) 04/22/2011  . Other specified cardiac arrhythmias 04/04/2011  . Other acquired deformities of unspecified  foot 03/22/2011  . Anemia of chronic kidney failure 02/09/2011  . Acute ischemic colitis ([place]) 02/09/2011  . Neuralgia and neuritis, unspecified 02/01/2011  . Lumbar stenosis with neurogenic claudication 01/30/2011  . Spondylolisthesis of lumbar region 01/30/2011  . End stage renal disease on dialysis ([place]) 01/30/2011  . Hypertension 01/30/2011  . Unspecified viral hepatitis C without hepatic coma 01/30/2011  . Hepatitis C antibody test positive 01/30/2011  . Alcohol abuse, uncomplicated [id number]  . Awaiting organ transplant status 03/17/2009  . Benign neoplasm of unspecified kidney 03/17/2009  . Disorder of phosphorus metabolism, unspecified 03/17/2009  . Hypercalcemia 03/17/2009  . Other long term (current) drug therapy 03/17/2009  . Personal history of pneumonia (recurrent) 03/17/2009  . Patient's noncompliance with other medical treatment and regimen 03/17/2009  . Polycystic kidney, adult type 03/17/2009  . Acquired absence of kidney 06/21/2008  . Disorder of kidney and ureter, unspecified 06/14/2008  . Other abnormal glucose 05/06/2006  . Type 2 diabetes mellitus without complications ([place]) 123XX123  . Secondary hyperparathyroidism of renal origin ([place]) 12/17/2002  . Postprocedural hypoparathyroidism ([place]) 01/16/2001  . Contact with and (suspected) exposure to potentially hazardous body fluids 03/17/2000  . Other intervertebral disc degeneration, lumbosacral region 09/06/1999  . Allergy status to other drugs, medicaments and biological substances status 02/03/1998  . Hypertensive chronic kidney disease with stage 5 chronic kidney disease or end stage renal disease ([place]) 02/03/1998  . Nonrheumatic mitral (valve) insufficiency 02/03/1998  . Dependence on renal dialysis ([place]) 03/18/1992    Past Surgical History:  Procedure Laterality Date  . A/V FISTULAGRAM Right 02/05/2019   Procedure: A/V FISTULAGRAM;  Surgeon: Katha Cabal, MD;  Location: [facility] CV LAB;   Service: Cardiovascular;  Laterality: Right;  . APPENDECTOMY  1960's  . AV FISTULA PLACEMENT  04/08/2011   Procedure: INSERTION OF ARTERIOVENOUS (AV) GORE-TEX GRAFT ARM;  Surgeon: Rosetta Posner, MD;  Location: [facility] OR;  Service: Vascular;  Laterality: Right;  Revision of Upper Arm Gore-Tex Graft  . BACK SURGERY  01/30/2011   L4-S1 lumbar laminectomy facetectomy and foraminotomies for decompression a bilateral L4-5 and L5-S1 posterior lumbar interbody arthrodesis and bilateral L4-S1 posterior lateral arthrodesis for lumbar stenosis dynamic lumbar spondylolisthesis lumbar spondylosis lumbar radiculopathy with foot drop  . COLONOSCOPY  02/19/2011   Procedure: COLONOSCOPY;  Surgeon: Missy Sabins, MD;  Location: [facility];  Service: Endoscopy;  Laterality: N/A;  . COLONOSCOPY  03/07/2011   Procedure: COLONOSCOPY;  Surgeon: Cleotis Nipper, MD;  Location[facility] ENDOSCOPY;  Service: Endoscopy;  Laterality: N/A;  no prep needed--pt has long Hartmann's pouch and ileostomy  . COLOSTOMY  02/09/2011   Reason for surgery: ischemic colitis of right hemicolon requiring partial colectomy and ileostomy.  Surgeon: Harl Bowie, MD;  Location: [facility];  Service: General;  Laterality: Right;  . ILEOSTOMY CLOSURE  05/29/2011   Procedure: ILEOSTOMY TAKEDOWN;  Surgeon: Harl Bowie, MD;  Location: [facility];  Service: General;  Laterality: N/A;  .  INSERTION OF DIALYSIS CATHETER Right 04/15/2016   Procedure: INSERTION OF DIALYSIS CATHETER RIGHT INTERNAL JUGULAR;  Surgeon: Conrad Flat Lick, MD;  Location: [facility];  Service: Vascular;  Laterality: Right;  . LAPAROTOMY  02/09/2011   Procedure: EXPLORATORY LAPAROTOMY;  Surgeon: Harl Bowie, MD;  Location: [facility];  Service: General;  Laterality: N/A;  . NEPHRECTOMY  2010   right side done at [facility] on transplant list  . PARTIAL COLECTOMY  02/09/2011   Reason for surgery: ischemic colitis of right hemicolon; Surgeon: Harl Bowie, MD;  Location: [facility];  Service:  General;  Laterality: Right;  . REVISION OF ARTERIOVENOUS GORETEX GRAFT Right 04/15/2016   Procedure: REVISION OF ARTERIOVENOUS GORETEX GRAFT USING 54mmX20cm GORETEX GRAFT;  Surgeon: Conrad Delbarton, MD;  Location: [facility];  Service: Vascular;  Laterality: Right;  . RIB RESECTION     d/t kidney removal  . TONSILLECTOMY     as a child  . total parathyroidectomy  01/17/2001   with autotransplantation into left forearm // due to secondary hyperparathyroidism from ESRD  . UMBILICAL HERNIA REPAIR  age 34- 49  . UPPER EXTREMITY ANGIOGRAPHY Right 07/21/2017   Procedure: UPPER EXTREMITY ANGIOGRAPHY;  Surgeon: Waynetta Sandy, MD;  Location: [facility] CV LAB;  Service: Cardiovascular;  Laterality: Right;  [facility] VASCULAR SURGERY     right arm dialysis graft     OB History   No obstetric history on file.     Family History  Problem Relation Age of Onset  . Diabetes Father   . Stroke Mother   . COPD Brother   . Heart attack Brother   . Bronchitis Sister   . Anesthesia problems Neg Hx   . Hypotension Neg Hx   . Malignant hyperthermia Neg Hx   . Pseudochol deficiency Neg Hx     Social History   Tobacco Use  . Smoking status: Current Every Day Smoker    Packs/day: 0.50    Years: 48.00    Pack years: 24.00    Types: Cigarettes  . Smokeless tobacco: Never Used  . Tobacco comment: 6 cigarettes per day  Substance Use Topics  . Alcohol use: Yes    Alcohol/week: 3.0 - 4.0 standard drinks    Types: 3 - 4 Shots of liquor per week    Comment: 3-4 shots of liquor on Fridays  . Drug use: No    Home Medications Prior to Admission medications   Medication Sig Start Date End Date Taking? Authorizing Provider  benzonatate (TESSALON PERLES) 100 MG capsule Take 100 mg by mouth daily as needed for cough.  02/17/18   [provider]  calcium carbonate (CALCI-CHEW) 1250 (500 Ca) MG chewable tablet Chew 3 tablets by mouth 3 (three) times daily after meals.     [provider]   diphenhydrAMINE (BENADRYL) 25 MG tablet Take 50 mg by mouth daily as needed (cold symptoms).    [provider]  folic acid-vitamin b complex-vitamin c-selenium-zinc (DIALYVITE) 3 MG TABS tablet Take 1 tablet by mouth daily.    [provider]  Ibuprofen 200 MG CAPS Take 400 mg by mouth every 6 (six) hours as needed for headache or moderate pain.  01/07/17   [provider]  levofloxacin (LEVAQUIN) 250 MG tablet Take 1 tablet (250 mg total) by mouth daily. Take 500 mg (two tablets) as first dose after hemodialysis and then 250 mg (one tablet) every other day. Take after dialysis on dialysis days. 02/24/19   Sema Stangler,  Annie Main, MD  mirtazapine (REMERON) 15 MG tablet Take 15 mg by mouth at bedtime. 12/23/18   [provider]  oxyCODONE-acetaminophen (PERCOCET) 7.5-325 MG tablet Take 1 tablet by mouth 2 (two) times daily as needed for moderate pain.  01/11/19   [provider]  PROAIR HFA 108 (90 Base) MCG/ACT inhaler Inhale 1 puff into the lungs every 6 (six) hours as needed. 01/25/19   [provider]  TRAVATAN Z 0.004 % SOLN ophthalmic solution Place 1 drop into both eyes daily as needed (Burning).  07/11/17   [provider]    Allergies    Iodinated diagnostic agents, Hydrocodone, Metoprolol, and Iohexol  Review of Systems   Review of Systems   All systems reviewed and negative, other than as noted in HPI.   Physical Exam Updated Vital Signs BP (!) 162/90 (BP Location: Right Arm)   Pulse (!) 135   Temp 98.5 F (36.9 C) (Oral)   Resp (!) 25   SpO2 100%   Physical Exam Vitals and nursing note reviewed.  Constitutional:      General: She is not in acute distress.    Appearance: She is well-developed. She is ill-appearing.     Comments: Frail appearing  HENT:     Head: Normocephalic and atraumatic.  Eyes:     General:        Right eye: No discharge.        Left eye: No discharge.     Conjunctiva/sclera: Conjunctivae normal.   Cardiovascular:     Rate and Rhythm: Regular rhythm. Tachycardia present.     Heart sounds: Murmur present. No friction rub. No gallop.      Comments: HD fistula right upper extremity with thrill. Pulmonary:     Comments: Tachypnea.  Rales bilateral bases. Abdominal:     General: There is no distension.     Palpations: Abdomen is soft.     Tenderness: There is no abdominal tenderness.  Musculoskeletal:        General: No tenderness.     Cervical back: Neck supple.     Comments: No significant peripheral edema  Skin:    General: Skin is warm and dry.  Neurological:     Mental Status: She is alert.  Psychiatric:        Behavior: Behavior normal.        Thought Content: Thought content normal.     ED Results / Procedures / Treatments   Labs (all labs ordered are listed, but only abnormal results are displayed) Labs Reviewed  CBC WITH DIFFERENTIAL/PLATELET - Abnormal; Notable for the following components:      Result Value   WBC 19.0 (*)    RBC 3.72 (*)    Hemoglobin 10.1 (*)    HCT 30.6 (*)    RDW 15.8 (*)    Neutro Abs 15.4 (*)    Monocytes Absolute 1.2 (*)    Eosinophils Absolute 0.6 (*)    Abs Immature Granulocytes 0.09 (*)    All other components within normal limits  COMPREHENSIVE METABOLIC PANEL - Abnormal; Notable for the following components:   Potassium 5.8 (*)    Chloride 93 (*)    CO2 20 (*)    BUN 79 (*)    Creatinine, Ser 11.91 (*)    Calcium 8.0 (*)    Albumin 3.1 (*)    Total Bilirubin 1.5 (*)    GFR calc non Af Amer 3 (*)    GFR calc Af Amer 3 (*)  Anion gap 23 (*)    All other components within normal limits  BASIC METABOLIC PANEL - Abnormal; Notable for the following components:   Chloride 95 (*)    BUN 24 (*)    Creatinine, Ser 5.69 (*)    Calcium 8.7 (*)    GFR calc non Af Amer 7 (*)    GFR calc Af Amer 8 (*)    Anion gap 17 (*)    All other components within normal limits  CBC WITH DIFFERENTIAL/PLATELET - Abnormal; Notable for the  following components:   WBC 13.7 (*)    RBC 3.60 (*)    Hemoglobin 9.9 (*)    HCT 28.6 (*)    MCV 79.4 (*)    Neutro Abs 10.6 (*)    Eosinophils Absolute 0.8 (*)    Abs Immature Granulocytes 0.09 (*)    All other components within normal limits  BLOOD GAS, ARTERIAL - Abnormal; Notable for the following components:   pH, Arterial 7.469 (*)    pO2, Arterial 75.8 (*)    Acid-Base Excess 4.1 (*)    All other components within normal limits  GLUCOSE, CAPILLARY - Abnormal; Notable for the following components:   Glucose-Capillary 65 (*)    All other components within normal limits  GLUCOSE, CAPILLARY - Abnormal; Notable for the following components:   Glucose-Capillary 149 (*)    All other components within normal limits  RESPIRATORY PANEL BY RT PCR (FLU A&B, COVID)  MRSA PCR SCREENING  CULTURE, BLOOD (ROUTINE X 2)  CULTURE, BLOOD (ROUTINE X 2)  EXPECTORATED SPUTUM ASSESSMENT W REFEX TO RESP CULTURE  MAGNESIUM  PHOSPHORUS  HIV ANTIBODY (ROUTINE TESTING W REFLEX)  GLUCOSE, CAPILLARY  GLUCOSE, CAPILLARY  STREP PNEUMONIAE URINARY ANTIGEN    EKG EKG Interpretation  Date/Time:  Thursday February 25 2019 08:08:11 EST Ventricular Rate:  134 PR Interval:    QRS Duration: 99 QT Interval:  368 QTC Calculation: 550 R Axis:   70 Text Interpretation: Sinus tachycardia RSR' in V1 or V2, probably normal variant Probable left ventricular hypertrophy Anterior Q waves, possibly due to LVH Prolonged QT interval Confirmed by Virgel Manifold [phone]) on 02/25/2019 8:37:19 AM   Radiology DG Chest Portable 1 View  Result Date: 02/23/2019 CLINICAL DATA:  Shortness of breath and fever EXAM: PORTABLE CHEST 1 VIEW COMPARISON:  January 25, 2019 FINDINGS: There are bibasilar airspace opacities, left greater than right. There are likely small bilateral pleural effusions. The lungs are hyperexpanded. The heart size is enlarged. Aortic calcifications are noted. A vascular stent is noted in the right  axillary region. There is no acute osseous abnormality. IMPRESSION: 1. Bibasilar airspace opacities, left greater than right, suspicious for pneumonia. 2. Probable small bilateral pleural effusions. 3. Underlying COPD. Electronically Signed   By: Constance Holster M.D.   On: 02/23/2019 19:55    Procedures Procedures (including critical care time)  CRITICAL CARE Performed by: Virgel Manifold Total critical care time: 35 minutes Critical care time was exclusive of separately billable procedures and treating other patients. Critical care was necessary to treat or prevent imminent or life-threatening deterioration. Critical care was time spent personally by me on the following activities: development of treatment plan with patient and/or surrogate as well as nursing, discussions with consultants, evaluation of patient's response to treatment, examination of patient, obtaining history from patient or surrogate, ordering and performing treatments and interventions, ordering and review of laboratory studies, ordering and review of radiographic studies, pulse oximetry and re-evaluation of patient's condition.  Medications Ordered in ED Medications - No data to display  ED Course  I have reviewed the triage vital signs and the nursing notes.  Pertinent labs & imaging results that were available during my care of the patient were reviewed by me and considered in my medical decision making (see chart for details).    MDM Rules/Calculators/A&P  68 year old female with increased fatigue and dyspnea.  Her exam has changed significantly since I last saw her approximately 24 hours ago.  She is tachypneic and appears ill.  She is tachycardic in the 130s.   Recent COVID testing negative.  Blood cultures drawn on last visit without any growth to date.  Will obtain IV access.  No dialysis since last dose of antibiotics ~36 hours ago.  Will consult pharmacy for further dosing.  I am not sure how much of her  respiratory symptoms are related to pneumonia versus recently missing dialysis.  Probably some degree of both although she does not appear to be overtly volume overloaded currently.  We will repeat basic labs and a chest x-ray.  She will require admission.  She is now agreeable to this.   Final Clinical Impression(s) / ED Diagnoses Final diagnoses:  HCAP (healthcare-associated pneumonia)  ESRD on dialysis [facility])    Rx / DC Orders ED Discharge Orders    None       Virgel Manifold, MD 02/26/19 [id number]    Virgel Manifold, MD 03/14/19 [phone]

## 2019-02-25 NOTE — Consult Note (Addendum)
[facility] Renal Consultation Note    Indication for Consultation:  Management of ESRD/hemodialysis, anemia, hypertension/volume, and secondary hyperparathyroidism. PCP:  HPI: [name] is a 68 y.o. female with ESRD, HTN, Hep C, Hx R nephrectomy, Hx partial colectomy who was admitted with pneumonia.  Seen in ED bed - very confused and unable to given history. Per notes, she was in ED 12/8-12/9 and Dx with pneumonia - refused admission, elected outpatient treatment - had received 1 dose IV antibiotics and given Rx PO Levaquin. Back today via EMS with worsened dyspnea, hypoxia. Initial labs showed WBC 19, Hgb 10.1, K 5.8. COVID/Flu negative. Blood Cx drawn and she was started on Vancomycin/Cefepime. Repeat CXR actually looks improved - no clear infiltrate per my read.  Dialyzes at [facility] clinic. Last HD was 12/5 (Sat) - missed her treatment on Tues (12/8).  Past Medical History:  Diagnosis Date  . Anemia    anemia of chronic disease likely 2/2 ESRD per last anemia panel (02/2011) with Fe 35, TIBC 167, ferritin 2041  // BL Hgb 8-10  . Anuria    Due to dialysis  . Aortic stenosis, mild    03/2011 echo  . Arthritis    Back  . Blood transfusion 1990's   r/t Kidney removal surgery  . Brain aneurysm    No records could be found  . Colitis, ischemic ([place]) 01/2011   S/P partial colectomy of right hemicolon and ileostomy placement  . Diabetes mellitus    Borderline  . Dialysis patient [facility])    [place] Tues,Thursday, saturday  . Dysrhythmia    SVT for brief period in Feb 2013  . End stage renal disease on dialysis [facility])    Secondary to hypertension // T/Th/Sat dialysis on [employer]  . Foot drop, bilateral    after back surgery  . Heart murmur    Born with heart mumur, does not require follow up per pt  . Hepatitis C   . Hypertension    Does not see a heart doctor, had pre transplant stress test at [facility]    . Lumbar spinal stenosis    bilateral L4-5 and L5-S1 posterior lumbar interbody arthrodesis and bilateral L4-S1 posterior lateral arthrodesis for lumbar stenosis dynamic lumbar spondylolisthesis lumbar spondylosis lumbar radiculopathy with foot drop // s/p surgery 01/2011 - see surgery section for details.  . Seizures ([place])    r/t HTN in 1990's x 1  . Umbilical hernia age 48   Past Surgical History:  Procedure Laterality Date  . A/V FISTULAGRAM Right 02/05/2019   Procedure: A/V FISTULAGRAM;  Surgeon: Katha Cabal, MD;  Location: [place] CV LAB;  Service: Cardiovascular;  Laterality: Right;  . APPENDECTOMY  1960's  . AV FISTULA PLACEMENT  04/08/2011   Procedure: INSERTION OF ARTERIOVENOUS (AV) GORE-TEX GRAFT ARM;  Surgeon: Rosetta Posner, MD;  Location: [facility] OR;  Service: Vascular;  Laterality: Right;  Revision of Upper Arm Gore-Tex Graft  . BACK SURGERY  01/30/2011   L4-S1 lumbar laminectomy facetectomy and foraminotomies for decompression a bilateral L4-5 and L5-S1 posterior lumbar interbody arthrodesis and bilateral L4-S1 posterior lateral arthrodesis for lumbar stenosis dynamic lumbar spondylolisthesis lumbar spondylosis lumbar radiculopathy with foot drop  . COLONOSCOPY  02/19/2011   Procedure: COLONOSCOPY;  Surgeon: Missy Sabins, MD;  Location: [place];  Service: Endoscopy;  Laterality: N/A;  . COLONOSCOPY  03/07/2011   Procedure: COLONOSCOPY;  Surgeon: Cleotis Nipper, MD;  Location: [facility] ENDOSCOPY;  Service: Endoscopy;  Laterality:  N/A;  no prep needed--pt has long Hartmann's pouch and ileostomy  . COLOSTOMY  02/09/2011   Reason for surgery: ischemic colitis of right hemicolon requiring partial colectomy and ileostomy.  Surgeon: Harl Bowie, MD;  Location: [place];  Service: General;  Laterality: Right;  . ILEOSTOMY CLOSURE  05/29/2011   Procedure: ILEOSTOMY TAKEDOWN;  Surgeon: Harl Bowie, MD;  Location: [place];  Service: General;  Laterality: N/A;  . INSERTION OF DIALYSIS CATHETER Right  04/15/2016   Procedure: INSERTION OF DIALYSIS CATHETER RIGHT INTERNAL JUGULAR;  Surgeon: Conrad Sheyenne, MD;  Location: [place];  Service: Vascular;  Laterality: Right;  . LAPAROTOMY  02/09/2011   Procedure: EXPLORATORY LAPAROTOMY;  Surgeon: Harl Bowie, MD;  Location: [place];  Service: General;  Laterality: N/A;  . NEPHRECTOMY  2010   right side done at [facility] on transplant list  . PARTIAL COLECTOMY  02/09/2011   Reason for surgery: ischemic colitis of right hemicolon; Surgeon: Harl Bowie, MD;  Location: [place];  Service: General;  Laterality: Right;  . REVISION OF ARTERIOVENOUS GORETEX GRAFT Right 04/15/2016   Procedure: REVISION OF ARTERIOVENOUS GORETEX GRAFT USING 46mmX20cm GORETEX GRAFT;  Surgeon: Conrad Califon, MD;  Location: [place];  Service: Vascular;  Laterality: Right;  . RIB RESECTION     d/t kidney removal  . TONSILLECTOMY     as a child  . total parathyroidectomy  01/17/2001   with autotransplantation into left forearm // due to secondary hyperparathyroidism from ESRD  . UMBILICAL HERNIA REPAIR  age 87- 11  . UPPER EXTREMITY ANGIOGRAPHY Right 07/21/2017   Procedure: UPPER EXTREMITY ANGIOGRAPHY;  Surgeon: Waynetta Sandy, MD;  Location: [place] CV LAB;  Service: Cardiovascular;  Laterality: Right;  Marland Kitchen VASCULAR SURGERY     right arm dialysis graft   Family History  Problem Relation Age of Onset  . Diabetes Father   . Stroke Mother   . COPD Brother   . Heart attack Brother   . Bronchitis Sister   . Anesthesia problems Neg Hx   . Hypotension Neg Hx   . Malignant hyperthermia Neg Hx   . Pseudochol deficiency Neg Hx    Social History:  reports that she has been smoking cigarettes. She has a 24.00 pack-year smoking history. She has never used smokeless tobacco. She reports current alcohol use of about 3.0 - 4.0 standard drinks of alcohol per week. She reports that she does not use drugs.  ROS: Unable to provide/AMS  Physical Exam: Vitals:   02/25/19 1130  02/25/19 1145 02/25/19 1200 02/25/19 1300  BP: 138/74 (!) 145/85 135/77 (!) 144/77  Pulse:   (!) 112 (!) 101  Resp: 13 11 16 15   Temp:      TempSrc:      SpO2:   100% 100%     General: Frail woman, confused. Breathing heavily on nasal O2. Head: Normocephalic, atraumatic, sclera non-icteric, mucus membranes are moist. Neck: Supple without lymphadenopathy/masses.  Lungs: Clear bilaterally to auscultation without wheezes, rales, or rhonchi. Breathing slightly labored. Heart: RRR with normal S1, S2. No murmurs, rubs, or gallops appreciated. Abdomen: Soft, non-tender Musculoskeletal:  Reduced muscle mass Lower extremities: No edema or visible ischemic changes Neuro: Confused, not oriented Dialysis Access: RUE AVG + thrill  Allergies  Allergen Reactions  . Iodinated Diagnostic Agents Other (See Comments)    Seziure Other reaction(s): Other (See Comments) Seziure   . Hydrocodone Nausea And Vomiting  . Metoprolol Nausea And Vomiting  . Iohexol  Other (See Comments)    Reaction is convulsions   Prior to Admission medications   Medication Sig Start Date End Date Taking? Authorizing Provider  folic acid-vitamin b complex-vitamin c-selenium-zinc (DIALYVITE) 3 MG TABS tablet Take 1 tablet by mouth daily.   Yes [provider]  levofloxacin (LEVAQUIN) 250 MG tablet Take 1 tablet (250 mg total) by mouth daily. Take 500 mg (two tablets) as first dose after hemodialysis and then 250 mg (one tablet) every other day. Take after dialysis on dialysis days. 02/24/19  Yes Virgel Manifold, MD  mirtazapine (REMERON) 15 MG tablet Take 15 mg by mouth at bedtime. 12/23/18  Yes [provider]  oxyCODONE-acetaminophen (PERCOCET) 10-325 MG tablet Take 1 tablet by mouth 2 (two) times daily as needed for severe pain. 02/17/19  Yes [provider]  benzonatate (TESSALON PERLES) 100 MG capsule Take 100 mg by mouth daily as needed for cough.  02/17/18   [provider]  calcium  carbonate (CALCI-CHEW) 1250 (500 Ca) MG chewable tablet Chew 3 tablets by mouth 3 (three) times daily after meals.     [provider]  diphenhydrAMINE (BENADRYL) 25 MG tablet Take 50 mg by mouth daily as needed (cold symptoms).    [provider]  Ibuprofen 200 MG CAPS Take 400 mg by mouth every 6 (six) hours as needed for headache or moderate pain.  01/07/17   [provider]  PROAIR HFA 108 (90 Base) MCG/ACT inhaler Inhale 1 puff into the lungs every 6 (six) hours as needed. 01/25/19   [provider]   Current Facility-Administered Medications  Medication Dose Route Frequency Provider Last Rate Last Admin  . 0.9 %  sodium chloride infusion  250 mL Intravenous PRN Karmen Bongo, MD      . acetaminophen (TYLENOL) tablet 650 mg  650 mg Oral Q6H PRN Karmen Bongo, MD       Or  . acetaminophen (TYLENOL) suppository 650 mg  650 mg Rectal Q6H PRN Karmen Bongo, MD      . albuterol (VENTOLIN HFA) 108 (90 Base) MCG/ACT inhaler 1 puff  1 puff Inhalation Q4H PRN Karmen Bongo, MD      . calcium carbonate (dosed in mg elemental calcium) suspension 500 mg of elemental calcium  500 mg of elemental calcium Oral Q6H PRN Karmen Bongo, MD      . camphor-menthol [facility]) lotion 1 application  1 application Topical [id number] PRN Karmen Bongo, MD       And  . hydrOXYzine (ATARAX/VISTARIL) tablet 25 mg  25 mg Oral Q8H PRN Karmen Bongo, MD      . Derrill Memo ON 02/26/2019] ceFEPIme (MAXIPIME) 1 g in sodium chloride 0.9 % 100 mL IVPB  1 g Intravenous Q24H Karmen Bongo, MD      . Derrill Memo ON 02/26/2019] Chlorhexidine Gluconate Cloth 2 % PADS 6 each  6 each Topical Q0600 Loren Racer, PA-C      . docusate sodium (ENEMEEZ) enema 283 mg  1 enema Rectal PRN Karmen Bongo, MD      . feeding supplement (NEPRO CARB STEADY) liquid 237 mL  237 mL Oral TID PRN Karmen Bongo, MD      . heparin injection 5,000 Units  5,000 Units Subcutaneous Lynne Logan, MD      .  mirtazapine (REMERON) tablet 15 mg  15 mg Oral Ivery Quale, MD      . ondansetron [employer]) tablet 4 mg  4 mg Oral Q6H PRN Karmen Bongo, MD  Or  . ondansetron (ZOFRAN) injection 4 mg  4 mg Intravenous Q6H PRN Karmen Bongo, MD      . oxyCODONE-acetaminophen (PERCOCET) 7.5-325 MG per tablet 1 tablet  1 tablet Oral BID PRN Karmen Bongo, MD      . sodium chloride flush (NS) 0.9 % injection 3 mL  3 mL Intravenous Q12H Karmen Bongo, MD      . sodium chloride flush (NS) 0.9 % injection 3 mL  3 mL Intravenous PRN Karmen Bongo, MD      . sorbitol 70 % solution 30 mL  30 mL Oral PRN Karmen Bongo, MD      . Derrill Memo ON 02/26/2019] vancomycin variable dose per unstable renal function (pharmacist dosing)   Does not apply See admin instructions Karmen Bongo, MD      . zolpidem Lorrin Mais) tablet 5 mg  5 mg Oral QHS PRN Karmen Bongo, MD       Current Outpatient Medications  Medication Sig Dispense Refill  . folic acid-vitamin b complex-vitamin c-selenium-zinc (DIALYVITE) 3 MG TABS tablet Take 1 tablet by mouth daily.    Marland Kitchen levofloxacin (LEVAQUIN) 250 MG tablet Take 1 tablet (250 mg total) by mouth daily. Take 500 mg (two tablets) as first dose after hemodialysis and then 250 mg (one tablet) every other day. Take after dialysis on dialysis days. 6 tablet 0  . mirtazapine (REMERON) 15 MG tablet Take 15 mg by mouth at bedtime.    Marland Kitchen oxyCODONE-acetaminophen (PERCOCET) 10-325 MG tablet Take 1 tablet by mouth 2 (two) times daily as needed for severe pain.    . benzonatate (TESSALON PERLES) 100 MG capsule Take 100 mg by mouth daily as needed for cough.     . calcium carbonate (CALCI-CHEW) 1250 (500 Ca) MG chewable tablet Chew 3 tablets by mouth 3 (three) times daily after meals.     . diphenhydrAMINE (BENADRYL) 25 MG tablet Take 50 mg by mouth daily as needed (cold symptoms).    . Ibuprofen 200 MG CAPS Take 400 mg by mouth every 6 (six) hours as needed for headache or moderate pain.     Marland Kitchen  PROAIR HFA 108 (90 Base) MCG/ACT inhaler Inhale 1 puff into the lungs every 6 (six) hours as needed.     Labs: Basic Metabolic Panel: Recent Labs  Lab 02/23/19 1921 02/25/19 0807  NA 132* 136  K 5.0 5.8*  CL 90* 93*  CO2 23 20*  GLUCOSE 119* 97  BUN 60* 79*  CREATININE 10.11* 11.91*  CALCIUM 7.8* 8.0*  PHOS  --  4.3   Liver Function Tests: Recent Labs  Lab 02/23/19 1921 02/25/19 0807  AST 22 25  ALT 12 15  ALKPHOS 85 95  BILITOT 1.2 1.5*  PROT 7.7 8.1  ALBUMIN 3.4* 3.1*   CBC: Recent Labs  Lab 02/23/19 1921 02/25/19 0807  WBC 15.6* 19.0*  NEUTROABS 14.0* 15.4*  HGB 9.7* 10.1*  HCT 29.4* 30.6*  MCV 83.8 82.3  PLT 295 359   Iron Studies:  Recent Labs    02/23/19 1921  FERRITIN 1,410*   Studies/Results: DG Chest Portable 1 View  Result Date: 02/25/2019 CLINICAL DATA:  Dyspnea.  Altered mental status. EXAM: PORTABLE CHEST 1 VIEW COMPARISON:  Single-view of the chest 02/23/2019. PA and lateral chest 01/25/2019. FINDINGS: Bibasilar airspace disease seen on the most recent comparison has almost completely resolved. Subtle opacities in the right mid lung zone are unchanged. No pneumothorax or pleural fluid. Heart size is normal. Atherosclerosis and a vascular stent  in the right arm are noted. IMPRESSION: Almost complete resolution of bibasilar airspace disease since the most recent study. Subtle opacities in the right mid and lower lung zone could be due to atelectasis or pneumonia and are unchanged. Electronically Signed   By: Inge Rise M.D.   On: 02/25/2019 09:05   DG Chest Portable 1 View  Result Date: 02/23/2019 CLINICAL DATA:  Shortness of breath and fever EXAM: PORTABLE CHEST 1 VIEW COMPARISON:  January 25, 2019 FINDINGS: There are bibasilar airspace opacities, left greater than right. There are likely small bilateral pleural effusions. The lungs are hyperexpanded. The heart size is enlarged. Aortic calcifications are noted. A vascular stent is noted in  the right axillary region. There is no acute osseous abnormality. IMPRESSION: 1. Bibasilar airspace opacities, left greater than right, suspicious for pneumonia. 2. Probable small bilateral pleural effusions. 3. Underlying COPD. Electronically Signed   By: Constance Holster M.D.   On: 02/23/2019 19:55   Dialysis Orders:  TTS at [facility] 3:30hr, 400/A1.5, EDW 44.5kg, 2K/2.5Ca, UFP #4, RUE AVG, heparin 1500 bolus - Mircera 30 q 2 weeks (last 11/23) - Hectoral 43mcg IV q HD  Assessment/Plan: 1.  ?Pneumonia: Repeat CXR today improved. WBC high. COVID/Flu negative. BCx pending, on Vanc/Cefepime. 2.  AMS: Her baseline is pretty sharp - very confused today. Consider head imaging. 3.  ESRD: Usual TTS sched - missed last HD, K high. For HD today. 4.  Hypertension/volume: BP slightly high - 2.5L UF as tolerated today. 5.  Anemia: Hgb 10.1 - not due for ESA yet. 6.  Metabolic bone disease: Ca/Phos ok - follow.   Veneta Penton, PA-C 02/25/2019, 3:32 PM  [facility] Pager: [phone]  Pt seen, examined and agree w A/P as above.  Kelly Splinter  MD 02/25/2019, 4:33 PM

## 2019-02-25 NOTE — ED Notes (Signed)
Pt not following commands like earlier, had MD Yates come to bed side to evaluate pt, MD yates stated pt will be moved to top of dialysis list, will continue to monitor.

## 2019-02-25 NOTE — Progress Notes (Signed)
Pharmacy Antibiotic Note  [name] is a 68 y.o. female admitted on 02/25/2019 with pneumonia.  Pharmacy has been consulted for vancomycin/cefepime dosing. ESRD on HD TTS - has not had HD since last Thursday per patient, missed several sessions.  Plan: Cefepime 2g IV x 1; then 1g IV q24h Vancomycin 1250mg  IV x1; then Vancomycin 500 mg IV QHD Monitor clinical progress, c/s, abx plan/LOT Pre-HD vancomycin level as indicated F/u HD schedule/tolerance inpatient to enter antibiotic maintenance doses      Temp (24hrs), Avg:98.5 F (36.9 C), Min:98.5 F (36.9 C), Max:98.5 F (36.9 C)  Recent Labs  Lab 02/23/19 1921 02/23/19 2340 02/25/19 0807  WBC 15.6*  --  19.0*  CREATININE 10.11*  --  11.91*  LATICACIDVEN 2.1* 2.1*  --     Estimated Creatinine Clearance: 3.9 mL/min (A) (by C-G formula based on SCr of 11.91 mg/dL (H)).    Allergies  Allergen Reactions  . Iodinated Diagnostic Agents Other (See Comments)    Seziure Other reaction(s): Other (See Comments) Seziure   . Hydrocodone Nausea And Vomiting  . Metoprolol Nausea And Vomiting  . Iohexol Other (See Comments)    Reaction is convulsions    Antimicrobials this admission: 12/10 vancomycin >>  12/10 cefepime >>   Dose adjustments this admission:   Microbiology results:   Elicia Lamp, PharmD, BCPS Clinical Pharmacist 02/25/2019 11:25 AM

## 2019-02-26 ENCOUNTER — Inpatient Hospital Stay (HOSPITAL_COMMUNITY): Payer: Medicare Other

## 2019-02-26 DIAGNOSIS — N186 End stage renal disease: Secondary | ICD-10-CM

## 2019-02-26 DIAGNOSIS — R4182 Altered mental status, unspecified: Secondary | ICD-10-CM

## 2019-02-26 DIAGNOSIS — I10 Essential (primary) hypertension: Secondary | ICD-10-CM

## 2019-02-26 DIAGNOSIS — J189 Pneumonia, unspecified organism: Principal | ICD-10-CM

## 2019-02-26 DIAGNOSIS — Z992 Dependence on renal dialysis: Secondary | ICD-10-CM

## 2019-02-26 LAB — BASIC METABOLIC PANEL
Anion gap: 17 — ABNORMAL HIGH (ref 5–15)
BUN: 24 mg/dL — ABNORMAL HIGH (ref 8–23)
CO2: 24 mmol/L (ref 22–32)
Calcium: 8.7 mg/dL — ABNORMAL LOW (ref 8.9–10.3)
Chloride: 95 mmol/L — ABNORMAL LOW (ref 98–111)
Creatinine, Ser: 5.69 mg/dL — ABNORMAL HIGH (ref 0.44–1.00)
GFR calc Af Amer: 8 mL/min — ABNORMAL LOW (ref >60)
GFR calc non Af Amer: 7 mL/min — ABNORMAL LOW (ref >60)
Glucose, Bld: 75 mg/dL (ref 70–99)
Potassium: 4.5 mmol/L (ref 3.5–5.1)
Sodium: 136 mmol/L (ref 135–145)

## 2019-02-26 LAB — GLUCOSE, CAPILLARY
Glucose-Capillary: 96 mg/dL (ref 70–99)
Glucose-Capillary: 65 mg/dL — ABNORMAL LOW (ref 70–99)
Glucose-Capillary: 149 mg/dL — ABNORMAL HIGH (ref 70–99)
Glucose-Capillary: 91 mg/dL (ref 70–99)
Glucose-Capillary: 80 mg/dL (ref 70–99)
Glucose-Capillary: 76 mg/dL (ref 70–99)
Glucose-Capillary: 83 mg/dL (ref 70–99)
Glucose-Capillary: 87 mg/dL (ref 70–99)
Glucose-Capillary: 89 mg/dL (ref 70–99)
Glucose-Capillary: 84 mg/dL (ref 70–99)
Glucose-Capillary: 165 mg/dL — ABNORMAL HIGH (ref 70–99)
Glucose-Capillary: 62 mg/dL — ABNORMAL LOW (ref 70–99)
Glucose-Capillary: 84 mg/dL (ref 70–99)

## 2019-02-26 LAB — CBC WITH DIFFERENTIAL/PLATELET
Abs Immature Granulocytes: 0.09 10*3/uL — ABNORMAL HIGH (ref 0.00–0.07)
Basophils Absolute: 0.1 10*3/uL (ref 0.0–0.1)
Basophils Relative: 1 %
Eosinophils Absolute: 0.8 10*3/uL — ABNORMAL HIGH (ref 0.0–0.5)
Eosinophils Relative: 6 %
HCT: 28.6 % — ABNORMAL LOW (ref 36.0–46.0)
Hemoglobin: 9.9 g/dL — ABNORMAL LOW (ref 12.0–15.0)
Immature Granulocytes: 1 %
Lymphocytes Relative: 9 %
Lymphs Abs: 1.2 10*3/uL (ref 0.7–4.0)
MCH: 27.5 pg (ref 26.0–34.0)
MCHC: 34.6 g/dL (ref 30.0–36.0)
MCV: 79.4 fL — ABNORMAL LOW (ref 80.0–100.0)
Monocytes Absolute: 1 10*3/uL (ref 0.1–1.0)
Monocytes Relative: 7 %
Neutro Abs: 10.6 10*3/uL — ABNORMAL HIGH (ref 1.7–7.7)
Neutrophils Relative %: 76 %
Platelets: 398 10*3/uL (ref 150–400)
RBC: 3.6 MIL/uL — ABNORMAL LOW (ref 3.87–5.11)
RDW: 15.2 % (ref 11.5–15.5)
WBC: 13.7 10*3/uL — ABNORMAL HIGH (ref 4.0–10.5)
nRBC: 0 % (ref 0.0–0.2)

## 2019-02-26 LAB — BLOOD GAS, ARTERIAL
Acid-Base Excess: 4.1 mmol/L — ABNORMAL HIGH (ref 0.0–2.0)
Bicarbonate: 27.7 mmol/L (ref 20.0–28.0)
Drawn by: 34719
FIO2: 28
O2 Saturation: 95.3 %
Patient temperature: 37
pCO2 arterial: 38.6 mmHg (ref 32.0–48.0)
pH, Arterial: 7.469 — ABNORMAL HIGH (ref 7.350–7.450)
pO2, Arterial: 75.8 mmHg — ABNORMAL LOW (ref 83.0–108.0)

## 2019-02-26 LAB — AMMONIA: Ammonia: 13 umol/L (ref 9–35)

## 2019-02-26 LAB — MRSA PCR SCREENING: MRSA by PCR: NEGATIVE

## 2019-02-26 MED ORDER — DEXTROSE 50 % IV SOLN
INTRAVENOUS | Status: AC
  Administered 2019-02-26: 04:00:00
  Filled 2019-02-26: qty 50

## 2019-02-26 MED ORDER — CHLORHEXIDINE GLUCONATE CLOTH 2 % EX PADS
6.0000 | MEDICATED_PAD | Freq: Every day | CUTANEOUS | Status: DC
  Administered 2019-02-26 – 2019-03-02 (×4): 6 via TOPICAL

## 2019-02-26 MED ORDER — ORAL CARE MOUTH RINSE
15.0000 mL | Freq: Two times a day (BID) | OROMUCOSAL | Status: DC
  Administered 2019-02-26 – 2019-03-04 (×12): 15 mL via OROMUCOSAL

## 2019-02-26 MED ORDER — DEXTROSE 5 % IV SOLN
INTRAVENOUS | Status: AC
  Administered 2019-02-26: 14:00:00 via INTRAVENOUS

## 2019-02-26 MED ORDER — DEXTROSE 50 % IV SOLN: 12.5000 g | INTRAVENOUS | Status: AC

## 2019-02-26 MED ORDER — VANCOMYCIN HCL IN DEXTROSE 500-5 MG/100ML-% IV SOLN
500.0000 mg | INTRAVENOUS | Status: DC
  Administered 2019-02-27: 500 mg via INTRAVENOUS
  Filled 2019-02-26: qty 100

## 2019-02-26 NOTE — Progress Notes (Signed)
   Vital Signs MEWS/VS Documentation      02/26/2019 0206 02/26/2019 0439 02/26/2019 0641 02/26/2019 0812   MEWS Score:  3  3  3  3    MEWS Score Color:  Yellow  Yellow  Yellow  Yellow   Resp:  18  17  18  20    Pulse:  (!) 109  (!) 108  (!) 101  (!) 106   BP:  128/78  127/72  137/79  124/76   Temp:  98.6 F (37 C)  --  --  98.5 F (36.9 C)   O2 Device:  Nasal Cannula  Nasal Cannula  Nasal Cannula  Nasal Cannula   O2 Flow Rate (L/min):  2 L/min  3 L/min  3 L/min  3 L/min   Level of Consciousness:  --  Responds to Pain  --  --      No acute change since admission. Physician is aware of heart rate and loc. Will continue to monitor patient closely. CBG this am is 80, VSS.      [name] 02/26/2019,8:37 AM

## 2019-02-26 NOTE — Progress Notes (Addendum)
2215 Pt arrived to floor via stretcher from HD. Pt is mute, not following commands, withdrawals to pain. VSS. Pt assessed, see flow sheet. Fall precautions in place, [facility].   84 RN making rounds on patient, pt is still mute, unable to follow commands, only withdrawals to pain. Pt not tracking with eyes. RN reviewed patient chart and history of DM noted, BS checked and was 58, medicated with dextrose amp. RN will continue to monitor closely.  0020 B. Kyere paged for an update in patient status, new orders received. BS rechecked, 165. Pt with no new changes from previous assessment. WCTM.   0045 Resp at bedside drawing ABG, then patient down to CT with RN and transport.   0115 RN, transport and patient back from CT scan. Pt with no new changes from previous assessment.   0130 B. Lennox Grumbles, NP here to assess patient. No new orders, pending ABG. Pt more alert, fall precautions in place, WCTM.   0200 Blood sugar recheck and WNL, VSS, WCTM. Pt more arousalable, groans when repositioned.   Saul.Cain NP called RN and instructed to bump O2 up to 3L [place], O2 low on ABG. BS rechecked and low, 65, dextrose amp given.  0430 BS rechecked and WNL, pt resting comfortably, NAD, WCTM.   0630 BS WNL, VSS, fall precautions in place, no new changes from previous assessment, awaiting to give shift report to oncoming RN.

## 2019-02-26 NOTE — Progress Notes (Signed)
EEG Completed; Results Pending  

## 2019-02-26 NOTE — Procedures (Signed)
Patient Name: [name]  MRN: [id number]  Epilepsy Attending: [name]  Referring Physician/Provider: Dr. Dr. Wonda Amis Date: 02/26/2019 Duration: 24.15 minutes  Patient history: 68 year old female with past medical history of seizures who presented with altered mental status.  EEG to evaluate for seizures.  Level of alertness: Awake/lethargic  AEDs during EEG study: None  Technical aspects: This EEG study was done with scalp electrodes positioned according to the 10-20 International system of electrode placement. Electrical activity was acquired at a sampling rate of 500Hz  and reviewed with a high frequency filter of 70Hz  and a low frequency filter of 1Hz . EEG data were recorded continuously and digitally stored.   Description: EEG showed continuous generalized 2 to 5 Hz theta-delta slowing.  Triphasic waves, generalized, maximal bifrontal, at 2 to 2.5 Hz were also seen.  No clear posterior dominant, seen.  Hyperventilation and photic stimulation were not performed.  One episode was noted around 1331 when patient was noted to be staring off.  Concomitant EEG did not show any change from baseline EEG.  Abnormality -Continuous slow, generalized -Triphasic waves, generalized, maximal bifrontal  IMPRESSION: This study is suggestive of severe diffuse encephalopathy, nonspecific etiology but could be seen in setting of toxic metabolic causes, cefepime toxicity. No seizures or definite epileptiform discharges were seen throughout the recording.  One episode of staring was noted at 1331 without any clear EEG changes suggest seizure.  This was most likely a nonepileptic event in the setting of encephalopathy.

## 2019-02-26 NOTE — Consult Note (Addendum)
NEURO HOSPITALIST CONSULT NOTE   Requesting physician: Dr. Louanne Belton  Reason for Consult:AMS  History obtained from:  Daughter and chart review  HPI:                                                                                                                                          [name] is an 68 y.o. female with a PMHx of seizures (not on AED), Hep C, ESRD (on dialysis T, TH, Sat), brain aneurysm, DM and aortic stenosis who presented to [facility] on 02/25/2019 for SOB. Neurology was consulted for decreased mental status.   Per daughter and chart review patient was seen on 12/8 for PNA; but declined admission at [facility] and was placed on levaquin at that time.  Patient has been having a mental status decline over the past 1-2 weeks. She missed both of her dialysis appointments this week. Her mental status was better at time of arrival to hospital. She was able to talk and state her name, but had some confusion. Her mental status continued to decline while in hospital, prior to dialysis, and has not gotten any better or worse since dialysis.  Patient has a history of seizures, but it was a single seizure that occurred 30 years ago. She was never on an AED.Marland Kitchen Her seizure at that time was thought to be r/t her hypertension and she was started on dialysis shortly after. At baseline she does need assistant to ambulate, but she has been abe to pay her own bills.  Past Medical History:  Diagnosis Date  . Anemia    anemia of chronic disease likely 2/2 ESRD per last anemia panel (02/2011) with Fe 35, TIBC 167, ferritin 2041  // BL Hgb 8-10  . Anuria    Due to dialysis  . Aortic stenosis, mild    03/2011 echo  . Arthritis    Back  . Blood transfusion 1990's   r/t Kidney removal surgery  . Brain aneurysm    No records could be found  . Colitis, ischemic ([place]) 01/2011   S/P partial colectomy of right hemicolon and ileostomy placement  . Diabetes mellitus    Borderline  .  Dialysis patient [facility])    [place] Tues,Thursday, saturday  . Dysrhythmia    SVT for brief period in Feb 2013  . End stage renal disease on dialysis [facility])    Secondary to hypertension // T/Th/Sat dialysis on [employer]  . Foot drop, bilateral    after back surgery  . Heart murmur    Born with heart mumur, does not require follow up per pt  . Hepatitis C   . Hypertension    Does not see a heart doctor, had pre transplant stress test at [facility]    .  Lumbar spinal stenosis     bilateral L4-5 and L5-S1 posterior lumbar interbody arthrodesis and bilateral L4-S1 posterior lateral arthrodesis for lumbar stenosis dynamic lumbar spondylolisthesis lumbar spondylosis lumbar radiculopathy with foot drop // s/p surgery 01/2011 - see surgery section for details.  . Seizures ([place])    r/t HTN in 1990's x 1  . Umbilical hernia age 76    Past Surgical History:  Procedure Laterality Date  . A/V FISTULAGRAM Right 02/05/2019   Procedure: A/V FISTULAGRAM;  Surgeon: Katha Cabal, MD;  Location: [place] CV LAB;  Service: Cardiovascular;  Laterality: Right;  . APPENDECTOMY  1960's  . AV FISTULA PLACEMENT  04/08/2011   Procedure: INSERTION OF ARTERIOVENOUS (AV) GORE-TEX GRAFT ARM;  Surgeon: Rosetta Posner, MD;  Location: [facility] OR;  Service: Vascular;  Laterality: Right;  Revision of Upper Arm Gore-Tex Graft  . BACK SURGERY  01/30/2011   L4-S1 lumbar laminectomy facetectomy and foraminotomies for decompression a bilateral L4-5 and L5-S1 posterior lumbar interbody arthrodesis and bilateral L4-S1 posterior lateral arthrodesis for lumbar stenosis dynamic lumbar spondylolisthesis lumbar spondylosis lumbar radiculopathy with foot drop  . COLONOSCOPY  02/19/2011   Procedure: COLONOSCOPY;  Surgeon: Missy Sabins, MD;  Location: [place];  Service: Endoscopy;  Laterality: N/A;  . COLONOSCOPY  03/07/2011   Procedure: COLONOSCOPY;  Surgeon: Cleotis Nipper, MD;  Location: [facility] ENDOSCOPY;  Service:  Endoscopy;  Laterality: N/A;  no prep needed--pt has long Hartmann's pouch and ileostomy  . COLOSTOMY  02/09/2011   Reason for surgery: ischemic colitis of right hemicolon requiring partial colectomy and ileostomy.  Surgeon: Harl Bowie, MD;  Location: [place];  Service: General;  Laterality: Right;  . ILEOSTOMY CLOSURE  05/29/2011   Procedure: ILEOSTOMY TAKEDOWN;  Surgeon: Harl Bowie, MD;  Location: [place];  Service: General;  Laterality: N/A;  . INSERTION OF DIALYSIS CATHETER Right 04/15/2016   Procedure: INSERTION OF DIALYSIS CATHETER RIGHT INTERNAL JUGULAR;  Surgeon: Conrad Bunkie, MD;  Location: [place];  Service: Vascular;  Laterality: Right;  . LAPAROTOMY  02/09/2011   Procedure: EXPLORATORY LAPAROTOMY;  Surgeon: Harl Bowie, MD;  Location: [place];  Service: General;  Laterality: N/A;  . NEPHRECTOMY  2010   right side done at [facility] on transplant list  . PARTIAL COLECTOMY  02/09/2011   Reason for surgery: ischemic colitis of right hemicolon; Surgeon: Harl Bowie, MD;  Location: [place];  Service: General;  Laterality: Right;  . REVISION OF ARTERIOVENOUS GORETEX GRAFT Right 04/15/2016   Procedure: REVISION OF ARTERIOVENOUS GORETEX GRAFT USING 44mmX20cm GORETEX GRAFT;  Surgeon: Conrad Paramus, MD;  Location: [place];  Service: Vascular;  Laterality: Right;  . RIB RESECTION     d/t kidney removal  . TONSILLECTOMY     as a child  . total parathyroidectomy  01/17/2001   with autotransplantation into left forearm // due to secondary hyperparathyroidism from ESRD  . UMBILICAL HERNIA REPAIR  age 64- 65  . UPPER EXTREMITY ANGIOGRAPHY Right 07/21/2017   Procedure: UPPER EXTREMITY ANGIOGRAPHY;  Surgeon: Waynetta Sandy, MD;  Location: [place] CV LAB;  Service: Cardiovascular;  Laterality: Right;  Marland Kitchen VASCULAR SURGERY     right arm dialysis graft    Family History  Problem Relation Age of Onset  . Diabetes Father   . Stroke Mother   . COPD Brother   . Heart attack  Brother   . Bronchitis Sister   . Anesthesia problems Neg Hx   . Hypotension Neg  Hx   . Malignant hyperthermia Neg Hx   . Pseudochol deficiency Neg Hx        Social History:  reports that she has been smoking cigarettes. She has a 24.00 pack-year smoking history. She has never used smokeless tobacco. She reports current alcohol use of about 3.0 - 4.0 standard drinks of alcohol per week. She reports that she does not use drugs.  Allergies  Allergen Reactions  . Iodinated Diagnostic Agents Other (See Comments)    Seziure Other reaction(s): Other (See Comments) Seziure   . Hydrocodone Nausea And Vomiting  . Metoprolol Nausea And Vomiting  . Iohexol Other (See Comments)    Reaction is convulsions    MEDICATIONS:                                                                                                                     Scheduled: . Chlorhexidine Gluconate Cloth  6 each Topical Q0600  . Chlorhexidine Gluconate Cloth  6 each Topical Q0600  . heparin  5,000 Units Subcutaneous Q8H  . mouth rinse  15 mL Mouth Rinse BID  . multivitamin  1 tablet Oral Daily  . sodium chloride flush  3 mL Intravenous Q12H  . [START ON 02/27/2019] vancomycin  500 mg Intravenous Q T,Th,Sa-HD   Continuous: . sodium chloride    . ceFEPime (MAXIPIME) IV    . dextrose     [id number] chloride, acetaminophen **OR** acetaminophen, albuterol, calcium carbonate (dosed in mg elemental calcium), docusate sodium, feeding supplement (NEPRO CARB STEADY), ondansetron **OR** ondansetron (ZOFRAN) IV, sodium chloride flush, sorbitol  ROS:                                                                                                                                        unobtainable from patient due to mental status  Blood pressure (!) 151/72, pulse 99, temperature 98.2 F (36.8 C), temperature source Oral, resp. rate 16, height 5\' 5"  (1.651 m), weight 42.9 kg, SpO2 100 %.   General Examination:  Physical Exam  Constitutional: Appears well-developed and well-nourished.  Psych: Affect appropriate to situation Eyes: Normal external eye and conjunctiva. HENT: Normocephalic, no lesions, without obvious abnormality.   Musculoskeletal-no joint tenderness, deformity or swelling Cardiovascular: Normal rate and regular rhythm.  Respiratory: Effort normal, non-labored breathing saturations WNL GI: Soft.  No distension. There is no tenderness.  Skin: WDI  Neurological Examination Mental Status: Awake. Was able to say "ow" in response to painful stimuli, no other verbalizations noted. Not following commands. Grimaces to painful stimuli.  Positive for sucking reflex. Cranial Nerves: Dolls eyes present, face appears symmetric.Reacts briskly to cold temp on face. Motor/ sensory : Increased flexor tone in BUE and BLE. No increased extensor tone noted.  Grimaces to noxious in all 4 extremities. Asterixis/mycoclonic activity noted intermittently in BUE and BLE. Deep Tendon Reflexes: hypoactive biceps, 2+ patellae Cerebellar: uta Gait: uta   Lab Results: Basic Metabolic Panel: Recent Labs  Lab 02/23/19 1921 02/25/19 0807 02/26/19 0318  NA 132* 136 136  K 5.0 5.8* 4.5  CL 90* 93* 95*  CO2 23 20* 24  GLUCOSE 119* 97 75  BUN 60* 79* 24*  CREATININE 10.11* 11.91* 5.69*  CALCIUM 7.8* 8.0* 8.7*  MG  --  2.3  --   PHOS  --  4.3  --     CBC: Recent Labs  Lab 02/23/19 1921 02/25/19 0807 02/26/19 0318  WBC 15.6* 19.0* 13.7*  NEUTROABS 14.0* 15.4* 10.6*  HGB 9.7* 10.1* 9.9*  HCT 29.4* 30.6* 28.6*  MCV 83.8 82.3 79.4*  PLT 295 359 398   02/26/2019:  BUN: 24 Creatinine: 5.69 (decreased post dialysis) was 11.91 on 12/10 ammonia: WNL   02/26/2019 ABG PH: 7.469 PC02: 38.6 P02:75.8  Imaging: CT HEAD WO CONTRAST  Result Date: 02/26/2019 CLINICAL DATA:  Encephalopathy EXAM: CT HEAD WITHOUT  CONTRAST TECHNIQUE: Contiguous axial images were obtained from the base of the skull through the vertex without intravenous contrast. COMPARISON:  09/30/2011 FINDINGS: Brain: There is no mass, hemorrhage or extra-axial collection. The size and configuration of the ventricles and extra-axial CSF spaces are normal. There is hypoattenuation of the white matter, most commonly indicating chronic small vessel disease. Old right thalamus small vessel infarct. Vascular: Atherosclerotic calcification of the internal carotid arteries at the skull base. No abnormal hyperdensity of the major intracranial arteries or dural venous sinuses. Skull: The visualized skull base, calvarium and extracranial soft tissues are normal. Sinuses/Orbits: No fluid levels or advanced mucosal thickening of the visualized paranasal sinuses. No mastoid or middle ear effusion. The orbits are normal. IMPRESSION: Chronic small vessel disease and old right thalamus small vessel infarct without acute intracranial abnormality. Electronically Signed   By: Ulyses Jarred M.D.   On: 02/26/2019 01:12   DG Chest Portable 1 View  Result Date: 02/25/2019 CLINICAL DATA:  Dyspnea.  Altered mental status. EXAM: PORTABLE CHEST 1 VIEW COMPARISON:  Single-view of the chest 02/23/2019. PA and lateral chest 01/25/2019. FINDINGS: Bibasilar airspace disease seen on the most recent comparison has almost completely resolved. Subtle opacities in the right mid lung zone are unchanged. No pneumothorax or pleural fluid. Heart size is normal. Atherosclerosis and a vascular stent in the right arm are noted. IMPRESSION: Almost complete resolution of bibasilar airspace disease since the most recent study. Subtle opacities in the right mid and lower lung zone could be due to atelectasis or pneumonia and are unchanged. Electronically Signed   By: Inge Rise M.D.   On: 02/25/2019 09:05    Assessment: 68 y.o.  female with a PMHx of seizures (not on AED), Hep C, ESRD (on  dialysis T, TH, Sat), brain aneurysm, DM and aortic stenosis who presented to [facility] on 02/25/2019 for SOB. Neurology was consulted for decreased mental status despite having dialysis here after 2 missed HD treatments prior to admission.  1. CTH was negative for acute abnormality.  2. Patient BUN and creatinine improved s/p dialysis. Uremic encephalopathy often improves after a delay following reinitiation of dialysis. Overall presentation most consistent with metabolic encephalopathy with some underlying dementia.  3. Seizures also possible given myoclonic-like movements on exam (which may also represent a severe presentation of asterixis due to metabolic encephalopathy). Will r/o seizure with EEG. 4. Cefepime neurotoxicity is also on the DDx  Recommendations: -- EEG -- Continue to correct metabolic derangements as you are -- Consider switching cefepime to an alternate anticonvulsant; as it can lower the seizure threshold and also can result in AMS (at elevated concentrations, cefepime can cross the blood-brain barrier and bind competitively to GABA receptors, increasing excitation and leading to a clinical picture of altered mental status and delirium) -- Neurology to continue to follow  Laurey Morale, MSN, NP-C [facility] [phone]  I have seen and examined the patient. I have formulated the assessment and recommendations. 68 year old female with AMS after missing 2 dialysis sessions. Other possible precipitants for her AMS as outlined above. Plan is for EEG. If electrographic seizures or seizure predisposition is noted noted, will start an anticonvulsant. Consider switching cefepime to an alternate anticonvulsant.  Electronically signed: Dr. Kerney Elbe 02/26/2019, 11:44 AM   Addendum: EEG report conclusions: This study is suggestive of severe diffuse encephalopathy, nonspecific etiology but could be seen in setting of toxic metabolic causes, cefepime toxicity. No seizures  or definite epileptiform discharges were seen throughout the recording. One episode of staring was noted at 1331 without any clear EEG changes suggest seizure.  This was most likely a nonepileptic event in the setting of encephalopathy.  Electronically signed: Dr. Kerney Elbe

## 2019-02-26 NOTE — Progress Notes (Addendum)
PROGRESS NOTE  [name] HLK:[id number] DOB: 1950/07/02 DOA: 02/25/2019 PCP: Janie Morning, DO   LOS: 1 day   Brief narrative:  [name] is a 68 y.o. female with medical history significant of seizures (due to HTN x 1 in 1990s); HTN; hep C; ESRD on TTS HD; DM; ischemic colitis s/p hemicolectomy and ileostomy; and brain aneurysm presenting with SOB.  She was seen on 12/8 for pneumonia at [facility].  Admission was planned but the patient eventually decided she did not want to wait for transport.  She eventually requested outpatient evaluation and was treated with Levaquin.  She developed worsening SOB and fatigue.  She was mildly confused but was able to have a conversation (somewhat nonsensical) at the time of admission..  Throughout her ER stay while awaiting admission, she has become increasingly confused and less responsive but remained hemodynamically stable.  ED Course:  Tachycardia, tachypnea, no hypoxia.  HCAP vs. Need for HD.  Nephrology consulted .  2 negative COVID tests <36 hours ago.    Covid 19 PCR was negative.  Assessment/Plan:  Principal Problem:   Uremia Active Problems:   End stage renal disease on dialysis (HCC)   Hypertension   Pneumonia   Metabolic encephalopathy.  Initially thought to have uremia.  Currently blood glucose levels on the low lower side.  Patient appears to be nonverbal with staring episodes and there is a possibility of seizure disorder.  Hypoglycemia could be contributing..  Borderline tachycardia otherwise hemodynamically stable.  Afebrile at this time.  Patient is empirically on vancomycin and cefepime for pneumonia.  Avoid sedative hypnotic medication at this time.  CT head scan from yesterday did not show acute findings.  Continue hemodialysis.  Check CT scan of the head.  ABG reviewed with pH of 7.4 with PO2 of 75.  Magnesium phosphorus within normal limits.  We will continue the patientt on D5 water at 50 mL/h.  Closely monitor.  Will  consult neurology for possible seizures.  Spoke with Dr. Cheral Marker.  Health care associated pneumonia with hypoxic respiratory failure on 3 L of oxygen by nasal cannula..  Check strep pneumonia urinary antigen.  Follow blood cultures,  Pending.  End-stage renal disease on hemodialysis.  Missed hemodialysis session.  Creatinine on presentation was 11 with BUN of 79.  Potassium was 5.8 on admission.  Nephrology on board, continue dialysis as per nephrology.  Creatinine of 5.6 and BUN of 24.  Hyperkalemia on admission.  Improved with dialysis.  Will closely monitor.  Leukocytosis.  WBC of 13.7, trended down from 19,000.  Covid PCR negative.  Empirically on vancomycin and cefepime for healthcare associated pneumonia.  Anemia from chronic kidney disease end-stage renal disease.  VTE Prophylaxis: Heparin subcu  Code Status: Full code  Family Communication: Spoke with the patient's daughter at bedside and updated about the clinical condition of the patient.  Disposition Plan: Home with home health   Consultants:  Nephrology  Neurology  Procedures:  Hemodialysis  Antibiotics: Anti-infectives (From admission, onward)   Start     Dose/Rate Route Frequency Ordered Stop   02/26/19 1200  ceFEPIme (MAXIPIME) 1 g in sodium chloride 0.9 % 100 mL IVPB     1 g 200 mL/hr over 30 Minutes Intravenous Every 24 hours 02/25/19 1127     02/26/19 0000  vancomycin variable dose per unstable renal function (pharmacist dosing)      Does not apply See admin instructions 02/25/19 1141     02/25/19 1130  ceFEPIme (MAXIPIME)  2 g in sodium chloride 0.9 % 100 mL IVPB     2 g 200 mL/hr over 30 Minutes Intravenous  Once 02/25/19 1126 02/25/19 1225   02/25/19 1130  vancomycin (VANCOCIN) 1,250 mg in sodium chloride 0.9 % 250 mL IVPB     1,250 mg 166.7 mL/hr over 90 Minutes Intravenous  Once 02/25/19 1127 02/25/19 1421       Subjective: Today, patient is nonverbal.  Not responsive.  Vitals are stable.   Nursing staff reported that she has been like that the since she came to the floor.  Staring constantly.  Objective: Vitals:   02/26/19 0812 02/26/19 1000  BP: 124/76 (!) 142/79  Pulse: (!) 106 (!) 102  Resp: 20 18  Temp: 98.5 F (36.9 C) 98.6 F (37 C)  SpO2: 100% 100%    Intake/Output Summary (Last 24 hours) at 02/26/2019 1018 Last data filed at 02/26/2019 0649 Gross per 24 hour  Intake 100 ml  Output 2000 ml  Net -1900 ml   Filed Weights   02/25/19 2242  Weight: 42.9 kg   Body mass index is 15.74 kg/m.   Physical Exam: GENERAL: Patient is nonverbal.  not responsive to verbal communication.  Not in obvious distress.  On 3 L of nasal cannula oxygen.  Thinly built. HENT: No scleral pallor or icterus. Pupils equally reactive to light. Oral mucosa is dry mucosa NECK: is supple, no palpable thyroid enlargement. CHEST:  Diminished breath sounds bilaterally. CVS: S1 and S2 heard, no murmur. Regular rate and rhythm. No pericardial rub. ABDOMEN: Soft, bowel sounds are present. EXTREMITIES: No edema. Stiff limbs CNS: Nonverbal.  None responsive to verbal communication.  Stiff extremities. SKIN: warm and dry without rashes.  Data Review: I have personally reviewed the following laboratory data and studies,  CBC: Recent Labs  Lab 02/23/19 1921 02/25/19 0807 02/26/19 0318  WBC 15.6* 19.0* 13.7*  NEUTROABS 14.0* 15.4* 10.6*  HGB 9.7* 10.1* 9.9*  HCT 29.4* 30.6* 28.6*  MCV 83.8 82.3 79.4*  PLT 295 359 916   Basic Metabolic Panel: Recent Labs  Lab 02/23/19 1921 02/25/19 0807 02/26/19 0318  NA 132* 136 136  K 5.0 5.8* 4.5  CL 90* 93* 95*  CO2 23 20* 24  GLUCOSE 119* 97 75  BUN 60* 79* 24*  CREATININE 10.11* 11.91* 5.69*  CALCIUM 7.8* 8.0* 8.7*  MG  --  2.3  --   PHOS  --  4.3  --    Liver Function Tests: Recent Labs  Lab 02/23/19 1921 02/25/19 0807  AST 22 25  ALT 12 15  ALKPHOS 85 95  BILITOT 1.2 1.5*  PROT 7.7 8.1  ALBUMIN 3.4* 3.1*   No results  for input(s): LIPASE, AMYLASE in the last 168 hours. No results for input(s): AMMONIA in the last 168 hours. Cardiac Enzymes: No results for input(s): CKTOTAL, CKMB, CKMBINDEX, TROPONINI in the last 168 hours. BNP (last 3 results) Recent Labs    02/23/19 1921  BNP 488.5*    ProBNP (last 3 results) No results for input(s): PROBNP in the last 8760 hours.  CBG: Recent Labs  Lab 02/26/19 0359 02/26/19 0437 02/26/19 0639 02/26/19 0811 02/26/19 0956  GLUCAP 65* 149* 91 80 76   Recent Results (from the past 240 hour(s))  SARS Coronavirus 2 Ag (30 min TAT) - Nasal Swab (BD Veritor Kit)     Status: None   Collection Time: 02/23/19  7:21 PM   Specimen: Nasal Swab (BD Veritor Kit)  Result Value  Ref Range Status   SARS Coronavirus 2 Ag NEGATIVE NEGATIVE Final    Comment: (NOTE) SARS-CoV-2 antigen NOT DETECTED.  Negative results are presumptive.  Negative results do not preclude SARS-CoV-2 infection and should not be used as the sole basis for treatment or other patient management decisions, including infection  control decisions, particularly in the presence of clinical signs and  symptoms consistent with COVID-19, or in those who have been in contact with the virus.  Negative results must be combined with clinical observations, patient history, and epidemiological information. The expected result is Negative. Fact Sheet for Patients: PodPark.tn Fact Sheet for Healthcare Providers: GiftContent.is This test is not yet approved or cleared by the [place] FDA and  has been authorized for detection and/or diagnosis of SARS-CoV-2 by FDA under an Emergency Use Authorization (EUA).  This EUA will remain in effect (meaning this test can be used) for the duration of  the COVID-19 de claration under Section 564(b)(1) of the Act, 21 U.S.C. section 360bbb-3(b)(1), unless the authorization is terminated or revoked  sooner. Performed at [facility], [address] [place], [place] [address]   Culture, blood (Routine X 2) w Reflex to ID Panel     Status: None (Preliminary result)   Collection Time: 02/23/19  7:25 PM   Specimen: BLOOD  Result Value Ref Range Status   Specimen Description   Final    BLOOD BLOOD LEFT WRIST Performed at [facility], [address] [place], [place] [address]    Special Requests   Final    BOTTLES DRAWN AEROBIC AND ANAEROBIC Blood Culture adequate volume Performed at [facility], [address], [place], [place] [address]    Culture   Final    NO GROWTH 1 DAY Performed at [facility], [place] [address], [place], [place] [address]    Report Status PENDING  Incomplete  Culture, blood (Routine X 2) w Reflex to ID Panel     Status: None (Preliminary result)   Collection Time: 02/23/19  7:30 PM   Specimen: BLOOD  Result Value Ref Range Status   Specimen Description   Final    BLOOD LEFT ANTECUBITAL Performed at [facility], [address] [place], [place] [address]    Special Requests   Final    BOTTLES DRAWN AEROBIC AND ANAEROBIC Blood Culture adequate volume Performed at [facility], [address] [place], [place] [address]    Culture   Final    NO GROWTH 1 DAY Performed at [facility], [place] [address], [place], [place] [address]    Report Status PENDING  Incomplete  SARS CORONAVIRUS 2 (TAT 6-24 HRS) Nasopharyngeal Nasopharyngeal Swab     Status: None   Collection Time: 02/23/19 10:06 PM   Specimen: Nasopharyngeal Swab  Result Value Ref Range Status   SARS Coronavirus 2 NEGATIVE NEGATIVE Final    Comment: (NOTE) SARS-CoV-2 target nucleic acids are NOT DETECTED. The SARS-CoV-2 RNA is generally detectable in upper and lower respiratory specimens during the acute phase of infection. Negative results do not preclude SARS-CoV-2 infection, do not rule out co-infections with other  pathogens, and should not be used as the sole basis for treatment or other patient management decisions. Negative results must be combined with clinical observations, patient history, and epidemiological information. The expected result is Negative. Fact Sheet for Patients: SugarRoll.be Fact Sheet for Healthcare Providers: https://www.woods-mathews.com/ This test is  not yet approved or cleared by the [place] and  has been authorized for detection and/or diagnosis of SARS-CoV-2 by FDA under an Emergency Use Authorization (EUA). This EUA will remain  in effect (meaning this test can be used) for the duration of the COVID-19 declaration under Section 56 4(b)(1) of the Act, 21 U.S.C. section 360bbb-3(b)(1), unless the authorization is terminated or revoked sooner. Performed at [facility], [place] [address] [place], [place] [address]   Respiratory Panel by RT PCR (Flu A&B, Covid) - Nasopharyngeal Swab     Status: None   Collection Time: 02/25/19 11:01 AM   Specimen: Nasopharyngeal Swab  Result Value Ref Range Status   SARS Coronavirus 2 by RT PCR NEGATIVE NEGATIVE Final    Comment: (NOTE) SARS-CoV-2 target nucleic acids are NOT DETECTED. The SARS-CoV-2 RNA is generally detectable in upper respiratoy specimens during the acute phase of infection. The lowest concentration of SARS-CoV-2 viral copies this assay can detect is 131 copies/mL. A negative result does not preclude SARS-Cov-2 infection and should not be used as the sole basis for treatment or other patient management decisions. A negative result may occur with  improper specimen collection/handling, submission of specimen other than nasopharyngeal swab, presence of viral mutation(s) within the areas targeted by this assay, and inadequate number of viral copies (<131 copies/mL). A negative result must be combined with clinical observations, patient history, and  epidemiological information. The expected result is Negative. Fact Sheet for Patients:  PinkCheek.be Fact Sheet for Healthcare Providers:  GravelBags.it This test is not yet ap proved or cleared by the [place] FDA and  has been authorized for detection and/or diagnosis of SARS-CoV-2 by FDA under an Emergency Use Authorization (EUA). This EUA will remain  in effect (meaning this test can be used) for the duration of the COVID-19 declaration under Section 564(b)(1) of the Act, 21 U.S.C. section 360bbb-3(b)(1), unless the authorization is terminated or revoked sooner.    Influenza A by PCR NEGATIVE NEGATIVE Final   Influenza B by PCR NEGATIVE NEGATIVE Final    Comment: (NOTE) The Xpert Xpress SARS-CoV-2/FLU/RSV assay is intended as an aid in  the diagnosis of influenza from Nasopharyngeal swab specimens and  should not be used as a sole basis for treatment. Nasal washings and  aspirates are unacceptable for Xpert Xpress SARS-CoV-2/FLU/RSV  testing. Fact Sheet for Patients: PinkCheek.be Fact Sheet for Healthcare Providers: GravelBags.it This test is not yet approved or cleared by the [place] FDA and  has been authorized for detection and/or diagnosis of SARS-CoV-2 by  FDA under an Emergency Use Authorization (EUA). This EUA will remain  in effect (meaning this test can be used) for the duration of the  Covid-19 declaration under Section 564(b)(1) of the Act, 21  U.S.C. section 360bbb-3(b)(1), unless the authorization is  terminated or revoked. Performed at [facility], [place] [address], [place], [place] [address]   MRSA PCR Screening     Status: None   Collection Time: 02/26/19  2:08 AM   Specimen: Nasopharyngeal  Result Value Ref Range Status   MRSA by PCR NEGATIVE NEGATIVE Final    Comment:        The GeneXpert MRSA Assay (FDA approved for NASAL  specimens only), is one component of a comprehensive MRSA colonization surveillance program. It is not intended to diagnose MRSA infection nor to guide or monitor treatment for MRSA infections. Performed at [facility], [place] [address], [place], [place] [address]  Studies: CT HEAD WO CONTRAST  Result Date: 02/26/2019 CLINICAL DATA:  Encephalopathy EXAM: CT HEAD WITHOUT CONTRAST TECHNIQUE: Contiguous axial images were obtained from the base of the skull through the vertex without intravenous contrast. COMPARISON:  09/30/2011 FINDINGS: Brain: There is no mass, hemorrhage or extra-axial collection. The size and configuration of the ventricles and extra-axial CSF spaces are normal. There is hypoattenuation of the white matter, most commonly indicating chronic small vessel disease. Old right thalamus small vessel infarct. Vascular: Atherosclerotic calcification of the internal carotid arteries at the skull base. No abnormal hyperdensity of the major intracranial arteries or dural venous sinuses. Skull: The visualized skull base, calvarium and extracranial soft tissues are normal. Sinuses/Orbits: No fluid levels or advanced mucosal thickening of the visualized paranasal sinuses. No mastoid or middle ear effusion. The orbits are normal. IMPRESSION: Chronic small vessel disease and old right thalamus small vessel infarct without acute intracranial abnormality. Electronically Signed   By: Ulyses Jarred M.D.   On: 02/26/2019 01:12   DG Chest Portable 1 View  Result Date: 02/25/2019 CLINICAL DATA:  Dyspnea.  Altered mental status. EXAM: PORTABLE CHEST 1 VIEW COMPARISON:  Single-view of the chest 02/23/2019. PA and lateral chest 01/25/2019. FINDINGS: Bibasilar airspace disease seen on the most recent comparison has almost completely resolved. Subtle opacities in the right mid lung zone are unchanged. No pneumothorax or pleural fluid. Heart size is normal. Atherosclerosis and a vascular stent in  the right arm are noted. IMPRESSION: Almost complete resolution of bibasilar airspace disease since the most recent study. Subtle opacities in the right mid and lower lung zone could be due to atelectasis or pneumonia and are unchanged. Electronically Signed   By: Inge Rise M.D.   On: 02/25/2019 09:05    Scheduled Meds: . Chlorhexidine Gluconate Cloth  6 each Topical Q0600  . heparin  5,000 Units Subcutaneous Q8H  . mouth rinse  15 mL Mouth Rinse BID  . multivitamin  1 tablet Oral Daily  . sodium chloride flush  3 mL Intravenous Q12H  . vancomycin variable dose per unstable renal function (pharmacist dosing)   Does not apply See admin instructions    Continuous Infusions: . sodium chloride    . ceFEPime (MAXIPIME) IV       Flora Lipps, MD  [facility] 02/26/2019

## 2019-02-26 NOTE — Progress Notes (Signed)
[facility] Progress Note  Subjective: not responding, staring straight ahead  Vitals:   02/26/19 0439 02/26/19 0641 02/26/19 0812 02/26/19 1000  BP: 127/72 137/79 124/76 (!) 142/79  Pulse: (!) 108 (!) 101 (!) 106 (!) 102  Resp: 17 18 20 18   Temp:   98.5 F (36.9 C) 98.6 F (37 C)  TempSrc:   Oral Oral  SpO2: 100% 100% 100% 100%  Weight:      Height:        Inpatient medications: . Chlorhexidine Gluconate Cloth  6 each Topical Q0600  . heparin  5,000 Units Subcutaneous Q8H  . mouth rinse  15 mL Mouth Rinse BID  . multivitamin  1 tablet Oral Daily  . sodium chloride flush  3 mL Intravenous Q12H  . [START ON 02/27/2019] vancomycin  500 mg Intravenous Q T,Th,Sa-HD   . sodium chloride    . ceFEPime (MAXIPIME) IV    . dextrose     sodium chloride, acetaminophen **OR** acetaminophen, albuterol, calcium carbonate (dosed in mg elemental calcium), docusate sodium, feeding supplement (NEPRO CARB STEADY), ondansetron **OR** ondansetron (ZOFRAN) IV, sodium chloride flush, sorbitol    Exam: General: Frail woman, not responsive, starting straight ahead, eyes open, doesn't move to threat, some fluttering of eyelids No JVD.  Lungs: Clear bilaterally to auscultation  Heart: RRR with normal S1, S2. No RG. 3/6 SEM at apex Abdomen: Soft, non-tender Musculoskeletal:  Reduced muscle mass Lower extremities: No edema or gangrene Neuro: Confused, not oriented Dialysis Access: RUE AVG + thrill    Dialysis: TTS at [facility] 3.5h  400/A1.5   44.5kg    2K/2.5Ca bath   P4    RUE AVG   Hep 1500 - Mircera 30 q 2 weeks (last 11/23) - Hectoral 3mcg IV q HD  Assessment/Plan: 1.  AMS: CT head negative early this am.  Staring straight ahead and nonresponsive, some fluttering of eyelids, concerned poss having seizures. No better despite HD overnight.  Have d/w primary who will contact neurology.  2. ?Pneumonia: Repeat CXR today improved. WBC high. COVID/Flu negative. BCx pending, on  Vanc/Cefepime  3.  ESRD: Usual TTS sched - missed last HD. Had HD last night. Next HD tomorrow.  4.  Hypertension/volume: BP slightly high - 2.5L UF as tolerated today. 5.  Anemia: Hgb 10.1 - not due for ESA yet. 6.  Metabolic bone disease: Ca/Phos ok - follow.  Rob Drayk Humbarger 02/26/2019, 11:05 AM  Iron/TIBC/Ferritin/ %Sat    Component Value Date/Time   IRON 35 (L) 03/16/2011 1549   TIBC 167 (L) 03/16/2011 1549   FERRITIN 1,410 (H) 02/23/2019 1921   IRONPCTSAT 21 03/16/2011 1549   Recent Labs  Lab 02/23/19 2030 02/25/19 0807 02/26/19 0318  NA  --  136 136  K  --  5.8* 4.5  CL  --  93* 95*  CO2  --  20* 24  GLUCOSE  --  97 75  BUN  --  79* 24*  CREATININE  --  11.91* 5.69*  CALCIUM  --  8.0* 8.7*  PHOS  --  4.3  --   ALBUMIN  --  3.1*  --   INR 1.3*  --   --    Recent Labs  Lab 02/25/19 0807  AST 25  ALT 15  ALKPHOS 95  BILITOT 1.5*  PROT 8.1   Recent Labs  Lab 02/26/19 0318  WBC 13.7*  HGB 9.9*  HCT 28.6*  PLT 398

## 2019-02-27 LAB — PHOSPHORUS: Phosphorus: 4.6 mg/dL (ref 2.5–4.6)

## 2019-02-27 LAB — GLUCOSE, CAPILLARY
Glucose-Capillary: 100 mg/dL — ABNORMAL HIGH (ref 70–99)
Glucose-Capillary: 84 mg/dL (ref 70–99)
Glucose-Capillary: 88 mg/dL (ref 70–99)
Glucose-Capillary: 89 mg/dL (ref 70–99)
Glucose-Capillary: 91 mg/dL (ref 70–99)
Glucose-Capillary: 96 mg/dL (ref 70–99)
Glucose-Capillary: 99 mg/dL (ref 70–99)

## 2019-02-27 LAB — COMPREHENSIVE METABOLIC PANEL
ALT: 10 U/L (ref 0–44)
AST: 13 U/L — ABNORMAL LOW (ref 15–41)
Albumin: 2.8 g/dL — ABNORMAL LOW (ref 3.5–5.0)
Alkaline Phosphatase: 74 U/L (ref 38–126)
Anion gap: 18 — ABNORMAL HIGH (ref 5–15)
BUN: 32 mg/dL — ABNORMAL HIGH (ref 8–23)
CO2: 25 mmol/L (ref 22–32)
Calcium: 9 mg/dL (ref 8.9–10.3)
Chloride: 92 mmol/L — ABNORMAL LOW (ref 98–111)
Creatinine, Ser: 7.63 mg/dL — ABNORMAL HIGH (ref 0.44–1.00)
GFR calc Af Amer: 6 mL/min — ABNORMAL LOW (ref >60)
GFR calc non Af Amer: 5 mL/min — ABNORMAL LOW (ref >60)
Glucose, Bld: 101 mg/dL — ABNORMAL HIGH (ref 70–99)
Potassium: 4.5 mmol/L (ref 3.5–5.1)
Sodium: 135 mmol/L (ref 135–145)
Total Bilirubin: 1.1 mg/dL (ref 0.3–1.2)
Total Protein: 7.6 g/dL (ref 6.5–8.1)

## 2019-02-27 LAB — CBC
HCT: 30.1 % — ABNORMAL LOW (ref 36.0–46.0)
Hemoglobin: 10.3 g/dL — ABNORMAL LOW (ref 12.0–15.0)
MCH: 27.6 pg (ref 26.0–34.0)
MCHC: 34.2 g/dL (ref 30.0–36.0)
MCV: 80.7 fL (ref 80.0–100.0)
Platelets: 425 10*3/uL — ABNORMAL HIGH (ref 150–400)
RBC: 3.73 MIL/uL — ABNORMAL LOW (ref 3.87–5.11)
RDW: 15.4 % (ref 11.5–15.5)
WBC: 8.4 10*3/uL (ref 4.0–10.5)
nRBC: 0 % (ref 0.0–0.2)

## 2019-02-27 LAB — MAGNESIUM: Magnesium: 2.1 mg/dL (ref 1.7–2.4)

## 2019-02-27 MED ORDER — PIPERACILLIN-TAZOBACTAM IN DEX 2-0.25 GM/50ML IV SOLN
2.2500 g | Freq: Three times a day (TID) | INTRAVENOUS | Status: DC
  Administered 2019-02-27 – 2019-03-04 (×15): 2.25 g via INTRAVENOUS
  Filled 2019-02-27 (×17): qty 50

## 2019-02-27 MED ORDER — HEPARIN SODIUM (PORCINE) 1000 UNIT/ML DIALYSIS: 1500.0000 [IU] | Freq: Once | INTRAMUSCULAR | Status: DC

## 2019-02-27 MED ORDER — LIDOCAINE 5 % EX PTCH
1.0000 | MEDICATED_PATCH | CUTANEOUS | Status: DC
  Administered 2019-02-27 – 2019-03-04 (×6): 1 via TRANSDERMAL
  Filled 2019-02-27 (×6): qty 1

## 2019-02-27 NOTE — Progress Notes (Signed)
[facility] Progress Note  Subjective: not responding, staring straight ahead  Vitals:   02/26/19 1640 02/26/19 1900 02/26/19 2308 02/27/19 0728  BP: (!) 161/77  (!) 169/84 (!) 151/90  Pulse: (!) 105  (!) 103 (!) 110  Resp: 18 (!) 21 20 20   Temp: 98.8 F (37.1 C)  98.1 F (36.7 C) 97.6 F (36.4 C)  TempSrc: Oral  Oral Oral  SpO2: 100%  100% 100%  Weight:      Height:        Inpatient medications: . Chlorhexidine Gluconate Cloth  6 each Topical Q0600  . Chlorhexidine Gluconate Cloth  6 each Topical Q0600  . heparin  5,000 Units Subcutaneous Q8H  . mouth rinse  15 mL Mouth Rinse BID  . multivitamin  1 tablet Oral Daily  . sodium chloride flush  3 mL Intravenous Q12H  . vancomycin  500 mg Intravenous Q T,Th,Sa-HD   . sodium chloride    . ceFEPime (MAXIPIME) IV 1 g (02/26/19 1416)   sodium chloride, acetaminophen **OR** acetaminophen, albuterol, calcium carbonate (dosed in mg elemental calcium), docusate sodium, feeding supplement (NEPRO CARB STEADY), ondansetron **OR** ondansetron (ZOFRAN) IV, sodium chloride flush, sorbitol    Exam: General: Frail woman, tracking some today , remains nonverbal No JVD.  Lungs: Clear bilaterally to auscultation  Heart: RRR with normal S1, S2. No RG. 3/6 SEM at apex Abdomen: Soft, non-tender Musculoskeletal:  Reduced muscle mass Lower extremities: No edema or gangrene Neuro: Confused, not oriented Dialysis Access: RUE AVG + thrill    Dialysis: TTS at [facility] 3.5h  400/A1.5   44.5kg    2K/2.5Ca bath   P4    RUE AVG   Hep 1500 - Mircera 30 q 2 weeks (last 11/23) - Hectoral 8mcg IV q HD  Assessment/Plan: 1.  AMS: CT head negative. Seen by neuro, EEG c/w severe encephalopathy, no evidence seizures.  A little better today, tracking some.  HD today, hopefully will cont improve.   2. Pneumonia: on vanc/ cefepime, will need to dc cefepime per neuro rec's, will dw primary MD. WBC high. COVID/Flu negative, blood cx's neg, high  fevers have resolved.  3.  ESRD: HD TTS.  Had HD here 2d ago, plan HD today on schedule.  4.  Hypertension/volume: BP good, just under dry wt. No vol ^ on exam.  5.  Anemia: Hgb 10.1 - not due for ESA yet. 6.  Metabolic bone disease: Ca/Phos ok - follow.  Rob Kyeshia Zinn 02/27/2019, 11:34 AM  Iron/TIBC/Ferritin/ %Sat    Component Value Date/Time   IRON 35 (L) 03/16/2011 1549   TIBC 167 (L) 03/16/2011 1549   FERRITIN 1,410 (H) 02/23/2019 1921   IRONPCTSAT 21 03/16/2011 1549   Recent Labs  Lab 02/23/19 2030 02/27/19 0333  NA  --  135  K  --  4.5  CL  --  92*  CO2  --  25  GLUCOSE  --  101*  BUN  --  32*  CREATININE  --  7.63*  CALCIUM  --  9.0  PHOS  --  4.6  ALBUMIN  --  2.8*  INR 1.3*  --    Recent Labs  Lab 02/27/19 0333  AST 13*  ALT 10  ALKPHOS 74  BILITOT 1.1  PROT 7.6   Recent Labs  Lab 02/27/19 0333  WBC 8.4  HGB 10.3*  HCT 30.1*  PLT 425*

## 2019-02-27 NOTE — Progress Notes (Signed)
   Vital Signs MEWS/VS Documentation      02/26/2019 1901 02/26/2019 1933 02/26/2019 2308 02/27/2019 0728   MEWS Score:  4  2  2  2    MEWS Score Color:  Red  Yellow  Yellow  Yellow   Resp:  --  --  20  20   Pulse:  --  --  (!) 103  (!) 110   BP:  --  --  (!) 169/84  (!) 151/90   Temp:  --  --  98.1 F (36.7 C)  97.6 F (36.4 C)   O2 Device:  --  Nasal Cannula  Nasal Cannula  Nasal Cannula   O2 Flow Rate (L/min):  --  3 L/min  --  3 L/min   Level of Consciousness:  --  Responds to Voice  --  --       Patient HR 110 with morning vitals, assessed patient and found her grimacing and moaning as if to be uncomfortable. Turned patient to her right side with pillow wedge support. Patient is resting more comfortable. HR is now 108 which is no change from admission. BP is elevated which is also not a change from admission. Will notify physician on call and continue to monitor patient.     Caela Huot 02/27/2019,7:53 AM

## 2019-02-27 NOTE — Progress Notes (Signed)
Pharmacy Antibiotic Note  [name] is a 68 y.o. female admitted on 02/25/2019 with uremia and HCAP. Pt was started on vanc/cefepime at admission, but cefepime was stopped due to hx of seizures and worsening mental status. Vancomycin was stopped after negative MRSA PCR. Pharmacy has been consulted for Zosyn dosing.  Pt is on HD T/Th/S. Pt is afebrile, WBC wnl at 8.4.   Plan: Zosyn 2.25 gm IV q8h Monitor for clinical improvement, HD schedule, and abx length of therapy  Height: 5\' 5"  (165.1 cm) Weight: 94 lb 9.2 oz (42.9 kg) IBW/kg (Calculated) : 57  Temp (24hrs), Avg:98.2 F (36.8 C), Min:97.6 F (36.4 C), Max:98.8 F (37.1 C)  Recent Labs  Lab 02/23/19 1921 02/23/19 2340 02/25/19 0807 02/26/19 0318 02/27/19 0333  WBC 15.6*  --  19.0* 13.7* 8.4  CREATININE 10.11*  --  11.91* 5.69* 7.63*  LATICACIDVEN 2.1* 2.1*  --   --   --     Estimated Creatinine Clearance: 4.8 mL/min (A) (by C-G formula based on SCr of 7.63 mg/dL (H)).    Allergies  Allergen Reactions  . Iodinated Diagnostic Agents Other (See Comments)    Seziure Other reaction(s): Other (See Comments) Seziure   . Hydrocodone Nausea And Vomiting  . Metoprolol Nausea And Vomiting  . Iohexol Other (See Comments)    Reaction is convulsions    Antimicrobials this admission: Vanc 12/8 >> 12/12 Cefepime 12/8 >> 12/11 Zosyn 12/12 >>  Microbiology results: 12/10 BCx: NG2D 12/10 Covid neg 12/11 MRSA PCR: negative  Thank you for allowing pharmacy to be a part of this patient's care.  Berenice Bouton, PharmD PGY1 Pharmacy Resident Phone until 3:30 pm: [phone] 02/27/2019 12:36 PM

## 2019-02-27 NOTE — Progress Notes (Signed)
   Vital Signs MEWS/VS Documentation      02/27/2019 0736 02/27/2019 0800 02/27/2019 1441 02/27/2019 1627   MEWS Score:  2  2  2  3    MEWS Score Color:  Yellow  Yellow  Yellow  Yellow   Resp:  --  --  --  19   Pulse:  --  --  --  (!) 117   BP:  --  --  --  (!) 185/92   Temp:  --  --  --  98.7 F (37.1 C)   Level of Consciousness:  --  Responds to Voice  Responds to Voice  --     Patient is scheduled for HD at 1800 02/27/19. Assessed Patient RR when counted manually was 19 bpm. Patient is resting comfortably. Dr Vista Lawman text paged. Will continue to monitor.       Jasher Barkan 02/27/2019,5:21 PM

## 2019-02-27 NOTE — Progress Notes (Signed)
PROGRESS NOTE  [name] LOV:[id number] DOB: 1950/06/25 DOA: 02/25/2019 PCP: Janie Morning, DO   LOS: 2 days   Brief narrative:  [name] is a 68 y.o. female with medical history significant of seizures (due to HTN x 1 in 1990s); HTN; hep C; ESRD on TTS HD; DM; ischemic colitis s/p hemicolectomy and ileostomy; and brain aneurysm presenting with SOB.  She was seen on 12/8 for pneumonia at [facility].  Admission was planned but the patient eventually decided she did not want to wait for transport.  She eventually requested outpatient evaluation and was treated with Levaquin.  She developed worsening SOB and fatigue.  She was mildly confused but was able to have a conversation (somewhat nonsensical) at the time of admission..  Throughout her ER stay while awaiting admission, she has become increasingly confused and less responsive but remained hemodynamically stable.  ED Course:  Tachycardia, tachypnea, no hypoxia.  HCAP vs. Need for HD.  Nephrology consulted .  2 negative COVID tests <36 hours ago.    Covid 19 PCR was negative.  Assessment/Plan:  Principal Problem:   Uremia Active Problems:   End stage renal disease on dialysis [facility])   Hypertension   Pneumonia    Health care associated pneumonia with hypoxic respiratory failure  continue on oxygen supplementation Continue IV antibiotics -cefepime discontinued and switched to Zosyn on account of history of seizure Follow up on culture reports   .  End-stage renal disease on hemodialysis Missed hemodialysis session.  Creatinine on presentation was 11 with BUN of 79.  Potassium was 5.8 on admission.    Routine hemodialysis per Nephrology Hyperkalemia on admission.  Improved with dialysis.  Will closely monitor.  Encephalopathy  Toxic metabolic/ infectious etiology Head CT negative  EEG without any epileptiform discharges    VTE Prophylaxis: Heparin subcu  Code Status: Full code  Family Communication:    Disposition Plan: TBD   Consultants:  Nephrology  Neurology  Procedures:  Hemodialysis  Antibiotics: Anti-infectives (From admission, onward)   Start     Dose/Rate Route Frequency Ordered Stop   02/27/19 1200  vancomycin (VANCOCIN) IVPB 500 mg/100 ml premix     500 mg 100 mL/hr over 60 Minutes Intravenous Every T-Th-Sa (Hemodialysis) 02/26/19 1102     02/26/19 1200  ceFEPIme (MAXIPIME) 1 g in sodium chloride 0.9 % 100 mL IVPB  Status:  Discontinued     1 g 200 mL/hr over 30 Minutes Intravenous Every 24 hours 02/25/19 1127 02/27/19 1140   02/26/19 0000  vancomycin variable dose per unstable renal function (pharmacist dosing)  Status:  Discontinued      Does not apply See admin instructions 02/25/19 1141 02/26/19 1102   02/25/19 1130  ceFEPIme (MAXIPIME) 2 g in sodium chloride 0.9 % 100 mL IVPB     2 g 200 mL/hr over 30 Minutes Intravenous  Once 02/25/19 1126 02/25/19 1225   02/25/19 1130  vancomycin (VANCOCIN) 1,250 mg in sodium chloride 0.9 % 250 mL IVPB     1,250 mg 166.7 mL/hr over 90 Minutes Intravenous  Once 02/25/19 1127 02/25/19 1421       Subjective:  more responsive this afternoon remains confused  Objective: Vitals:   02/26/19 2308 02/27/19 0728  BP: (!) 169/84 (!) 151/90  Pulse: (!) 103 (!) 110  Resp: 20 20  Temp: 98.1 F (36.7 C) 97.6 F (36.4 C)  SpO2: 100% 100%    Intake/Output Summary (Last 24 hours) at 02/27/2019 1225 Last data filed at 02/27/2019  8546 Gross per 24 hour  Intake 774.92 ml  Output --  Net 774.92 ml   Filed Weights   02/25/19 2242  Weight: 42.9 kg   Body mass index is 15.74 kg/m.   Physical Exam: GENERAL: comfortable no acute distress HEENT: No scleral pallor or icterus. Pupils equally reactive to light. Oral mucosa is dry mucosa NECK: is supple, no palpable thyroid enlargement. CHEST:  Diminished breath sounds bilaterally. CVS: S1 and S2 heard, no murmur. Regular rate and rhythm. No pericardial rub. ABDOMEN: Soft,  bowel sounds are present. EXTREMITIES: No edema. Stiff limbs CNS: Nonverbal.  None responsive to verbal communication.  Stiff extremities. SKIN: warm and dry without rashes.  Data Review: I have personally reviewed the following laboratory data and studies,  CBC: Recent Labs  Lab 02/23/19 1921 02/25/19 0807 02/26/19 0318 02/27/19 0333  WBC 15.6* 19.0* 13.7* 8.4  NEUTROABS 14.0* 15.4* 10.6*  --   HGB 9.7* 10.1* 9.9* 10.3*  HCT 29.4* 30.6* 28.6* 30.1*  MCV 83.8 82.3 79.4* 80.7  PLT 295 359 398 270*   Basic Metabolic Panel: Recent Labs  Lab 02/23/19 1921 02/25/19 0807 02/26/19 0318 02/27/19 0333  NA 132* 136 136 135  K 5.0 5.8* 4.5 4.5  CL 90* 93* 95* 92*  CO2 23 20* 24 25  GLUCOSE 119* 97 75 101*  BUN 60* 79* 24* 32*  CREATININE 10.11* 11.91* 5.69* 7.63*  CALCIUM 7.8* 8.0* 8.7* 9.0  MG  --  2.3  --  2.1  PHOS  --  4.3  --  4.6   Liver Function Tests: Recent Labs  Lab 02/23/19 1921 02/25/19 0807 02/27/19 0333  AST 22 25 13*  ALT 12 15 10   ALKPHOS 85 95 74  BILITOT 1.2 1.5* 1.1  PROT 7.7 8.1 7.6  ALBUMIN 3.4* 3.1* 2.8*   No results for input(s): LIPASE, AMYLASE in the last 168 hours. Recent Labs  Lab 02/26/19 1056  AMMONIA 13   Cardiac Enzymes: No results for input(s): CKTOTAL, CKMB, CKMBINDEX, TROPONINI in the last 168 hours. BNP (last 3 results) Recent Labs    02/23/19 1921  BNP 488.5*    ProBNP (last 3 results) No results for input(s): PROBNP in the last 8760 hours.  CBG: Recent Labs  Lab 02/27/19 0352 02/27/19 0606 02/27/19 0724 02/27/19 0952 02/27/19 1154  GLUCAP 96 100* 99 84 88   Recent Results (from the past 240 hour(s))  SARS Coronavirus 2 Ag (30 min TAT) - Nasal Swab (BD Veritor Kit)     Status: None   Collection Time: 02/23/19  7:21 PM   Specimen: Nasal Swab (BD Veritor Kit)  Result Value Ref Range Status   SARS Coronavirus 2 Ag NEGATIVE NEGATIVE Final    Comment: (NOTE) SARS-CoV-2 antigen NOT DETECTED.  Negative results  are presumptive.  Negative results do not preclude SARS-CoV-2 infection and should not be used as the sole basis for treatment or other patient management decisions, including infection  control decisions, particularly in the presence of clinical signs and  symptoms consistent with COVID-19, or in those who have been in contact with the virus.  Negative results must be combined with clinical observations, patient history, and epidemiological information. The expected result is Negative. Fact Sheet for Patients: PodPark.tn Fact Sheet for Healthcare Providers: GiftContent.is This test is not yet approved or cleared by the [place] FDA and  has been authorized for detection and/or diagnosis of SARS-CoV-2 by FDA under an Emergency Use Authorization (EUA).  This EUA will remain  in effect (meaning this test can be used) for the duration of  the COVID-19 de claration under Section 564(b)(1) of the Act, 21 U.S.C. section 360bbb-3(b)(1), unless the authorization is terminated or revoked sooner. Performed at [facility], [address] [place], [place] [address]   Culture, blood (Routine X 2) w Reflex to ID Panel     Status: None (Preliminary result)   Collection Time: 02/23/19  7:25 PM   Specimen: BLOOD  Result Value Ref Range Status   Specimen Description   Final    BLOOD BLOOD LEFT WRIST Performed at [facility], [address] [place], [place] [address]    Special Requests   Final    BOTTLES DRAWN AEROBIC AND ANAEROBIC Blood Culture adequate volume Performed at [facility] [address] [place], [place] [address]    Culture   Final    NO GROWTH 3 DAYS Performed at [facility], [place] [address] [place], [place] [address]    Report Status PENDING  Incomplete  Culture, blood (Routine X 2) w Reflex to ID Panel     Status: None (Preliminary result)   Collection Time: 02/23/19   7:30 PM   Specimen: BLOOD  Result Value Ref Range Status   Specimen Description   Final    BLOOD LEFT ANTECUBITAL Performed at [facility], [address] [place], [place] [address]    Special Requests   Final    BOTTLES DRAWN AEROBIC AND ANAEROBIC Blood Culture adequate volume Performed at [facility], [address] [place], [place] [address]    Culture   Final    NO GROWTH 3 DAYS Performed at [facility], [place] [address], [place], [place] [address]    Report Status PENDING  Incomplete  SARS CORONAVIRUS 2 (TAT 6-24 HRS) Nasopharyngeal Nasopharyngeal Swab     Status: None   Collection Time: 02/23/19 10:06 PM   Specimen: Nasopharyngeal Swab  Result Value Ref Range Status   SARS Coronavirus 2 NEGATIVE NEGATIVE Final    Comment: (NOTE) SARS-CoV-2 target nucleic acids are NOT DETECTED. The SARS-CoV-2 RNA is generally detectable in upper and lower respiratory specimens during the acute phase of infection. Negative results do not preclude SARS-CoV-2 infection, do not rule out co-infections with other pathogens, and should not be used as the sole basis for treatment or other patient management decisions. Negative results must be combined with clinical observations, patient history, and epidemiological information. The expected result is Negative. Fact Sheet for Patients: SugarRoll.be Fact Sheet for Healthcare Providers: https://www.woods-mathews.com/ This test is not yet approved or cleared by the [place] FDA and  has been authorized for detection and/or diagnosis of SARS-CoV-2 by FDA under an Emergency Use Authorization (EUA). This EUA will remain  in effect (meaning this test can be used) for the duration of the COVID-19 declaration under Section 56 4(b)(1) of the Act, 21 U.S.C. section 360bbb-3(b)(1), unless the authorization is terminated or revoked sooner. Performed at [facility], [place] [address], [place],  [address]   Respiratory Panel by RT PCR (Flu A&B, Covid) - Nasopharyngeal Swab     Status: None   Collection Time: 02/25/19 11:01 AM   Specimen: Nasopharyngeal Swab  Result Value Ref Range Status   SARS Coronavirus 2 by RT PCR NEGATIVE NEGATIVE Final    Comment: (NOTE) SARS-CoV-2 target nucleic acids are NOT DETECTED. The SARS-CoV-2 RNA is generally  detectable in upper respiratoy specimens during the acute phase of infection. The lowest concentration of SARS-CoV-2 viral copies this assay can detect is 131 copies/mL. A negative result does not preclude SARS-Cov-2 infection and should not be used as the sole basis for treatment or other patient management decisions. A negative result may occur with  improper specimen collection/handling, submission of specimen other than nasopharyngeal swab, presence of viral mutation(s) within the areas targeted by this assay, and inadequate number of viral copies (<131 copies/mL). A negative result must be combined with clinical observations, patient history, and epidemiological information. The expected result is Negative. Fact Sheet for Patients:  PinkCheek.be Fact Sheet for Healthcare Providers:  GravelBags.it This test is not yet ap proved or cleared by the [place] FDA and  has been authorized for detection and/or diagnosis of SARS-CoV-2 by FDA under an Emergency Use Authorization (EUA). This EUA will remain  in effect (meaning this test can be used) for the duration of the COVID-19 declaration under Section 564(b)(1) of the Act, 21 U.S.C. section 360bbb-3(b)(1), unless the authorization is terminated or revoked sooner.    Influenza A by PCR NEGATIVE NEGATIVE Final   Influenza B by PCR NEGATIVE NEGATIVE Final    Comment: (NOTE) The Xpert Xpress SARS-CoV-2/FLU/RSV assay is intended as an aid in  the diagnosis of influenza from Nasopharyngeal swab specimens  and  should not be used as a sole basis for treatment. Nasal washings and  aspirates are unacceptable for Xpert Xpress SARS-CoV-2/FLU/RSV  testing. Fact Sheet for Patients: PinkCheek.be Fact Sheet for Healthcare Providers: GravelBags.it This test is not yet approved or cleared by the [place] FDA and  has been authorized for detection and/or diagnosis of SARS-CoV-2 by  FDA under an Emergency Use Authorization (EUA). This EUA will remain  in effect (meaning this test can be used) for the duration of the  Covid-19 declaration under Section 564(b)(1) of the Act, 21  U.S.C. section 360bbb-3(b)(1), unless the authorization is  terminated or revoked. Performed at [facility], [place] [address], [place], [place] [address]   Culture, blood (routine x 2) Call MD if unable to obtain prior to antibiotics being given     Status: None (Preliminary result)   Collection Time: 02/25/19  1:00 PM   Specimen: BLOOD  Result Value Ref Range Status   Specimen Description BLOOD LEFT ANTECUBITAL  Final   Special Requests   Final    BOTTLES DRAWN AEROBIC AND ANAEROBIC Blood Culture adequate volume   Culture   Final    NO GROWTH 2 DAYS Performed at [facility], [place] [address], [place], [place] [address]    Report Status PENDING  Incomplete  Culture, blood (routine x 2) Call MD if unable to obtain prior to antibiotics being given     Status: None (Preliminary result)   Collection Time: 02/25/19  1:16 PM   Specimen: BLOOD  Result Value Ref Range Status   Specimen Description BLOOD BLOOD LEFT FOREARM  Final   Special Requests   Final    BOTTLES DRAWN AEROBIC AND ANAEROBIC Blood Culture results may not be optimal due to an inadequate volume of blood received in culture bottles   Culture   Final    NO GROWTH 2 DAYS Performed at [facility], [place] [address] [place], [place] [address]    Report Status PENDING  Incomplete  MRSA  PCR Screening     Status: None   Collection Time: 02/26/19  2:08 AM  Specimen: Nasopharyngeal  Result Value Ref Range Status   MRSA by PCR NEGATIVE NEGATIVE Final    Comment:        The GeneXpert MRSA Assay (FDA approved for NASAL specimens only), is one component of a comprehensive MRSA colonization surveillance program. It is not intended to diagnose MRSA infection nor to guide or monitor treatment for MRSA infections. Performed at [facility], [place] [address], [place], [place] [address]      Studies: EEG  Result Date: 02/26/2019 [name], MD     02/26/2019  2:00 PM Patient Name: [name] MRN: [id number] Epilepsy Attending: [name] Referring Physician/Provider: Dr. Dr. Wonda Amis Date: 02/26/2019 Duration: 24.15 minutes Patient history: 68 year old female with past medical history of seizures who presented with altered mental status.  EEG to evaluate for seizures. Level of alertness: Awake/lethargic AEDs during EEG study: None Technical aspects: This EEG study was done with scalp electrodes positioned according to the 10-20 International system of electrode placement. Electrical activity was acquired at a sampling rate of 500Hz  and reviewed with a high frequency filter of 70Hz  and a low frequency filter of 1Hz . EEG data were recorded continuously and digitally stored. Description: EEG showed continuous generalized 2 to 5 Hz theta-delta slowing.  Triphasic waves, generalized, maximal bifrontal, at 2 to 2.5 Hz were also seen.  No clear posterior dominant, seen.  Hyperventilation and photic stimulation were not performed. One episode was noted around 1331 when patient was noted to be staring off.  Concomitant EEG did not show any change from baseline EEG. Abnormality -Continuous slow, generalized -Triphasic waves, generalized, maximal bifrontal IMPRESSION: This study is suggestive of severe diffuse encephalopathy, nonspecific etiology but could be seen in  setting of toxic metabolic causes, cefepime toxicity. No seizures or definite epileptiform discharges were seen throughout the recording. One episode of staring was noted at 1331 without any clear EEG changes suggest seizure.  This was most likely a nonepileptic event in the setting of encephalopathy.   CT HEAD WO CONTRAST  Result Date: 02/26/2019 CLINICAL DATA:  Encephalopathy EXAM: CT HEAD WITHOUT CONTRAST TECHNIQUE: Contiguous axial images were obtained from the base of the skull through the vertex without intravenous contrast. COMPARISON:  09/30/2011 FINDINGS: Brain: There is no mass, hemorrhage or extra-axial collection. The size and configuration of the ventricles and extra-axial CSF spaces are normal. There is hypoattenuation of the white matter, most commonly indicating chronic small vessel disease. Old right thalamus small vessel infarct. Vascular: Atherosclerotic calcification of the internal carotid arteries at the skull base. No abnormal hyperdensity of the major intracranial arteries or dural venous sinuses. Skull: The visualized skull base, calvarium and extracranial soft tissues are normal. Sinuses/Orbits: No fluid levels or advanced mucosal thickening of the visualized paranasal sinuses. No mastoid or middle ear effusion. The orbits are normal. IMPRESSION: Chronic small vessel disease and old right thalamus small vessel infarct without acute intracranial abnormality. Electronically Signed   By: Ulyses Jarred M.D.   On: 02/26/2019 01:12    Scheduled Meds: . Chlorhexidine Gluconate Cloth  6 each Topical Q0600  . Chlorhexidine Gluconate Cloth  6 each Topical Q0600  . heparin  5,000 Units Subcutaneous Q8H  . mouth rinse  15 mL Mouth Rinse BID  . multivitamin  1 tablet Oral Daily  . sodium chloride flush  3 mL Intravenous Q12H  . vancomycin  500 mg Intravenous Q T,Th,Sa-HD    Continuous Infusions: . sodium chloride       Iona Beard  Osei-Bonsu, MD  [facility] 02/27/2019

## 2019-02-28 DIAGNOSIS — G9341 Metabolic encephalopathy: Secondary | ICD-10-CM

## 2019-02-28 LAB — NOVEL CORONAVIRUS, NAA: SARS-CoV-2, NAA: NOT DETECTED

## 2019-02-28 LAB — GLUCOSE, CAPILLARY: Glucose-Capillary: 115 mg/dL — ABNORMAL HIGH (ref 70–99)

## 2019-02-28 MED ORDER — INFLUENZA VAC A&B SA ADJ QUAD 0.5 ML IM PRSY
0.5000 mL | PREFILLED_SYRINGE | INTRAMUSCULAR | Status: DC
  Filled 2019-02-28 (×2): qty 0.5

## 2019-02-28 NOTE — Progress Notes (Addendum)
NEURO HOSPITALIST PROGRESS NOTE   Subjective: Appears to be doing better than Friday. When examiner walked in and said hello patient responded "hello" , when asked how she was doing she responded with " fine". Did not answer any further questions including her name. Intermittently followed commands.   Exam: Vitals:   02/27/19 2314 02/28/19 0823  BP: 118/63 (!) 183/101  Pulse: (!) 115 (!) 123  Resp: 20   Temp: 98.2 F (36.8 C) 98.8 F (37.1 C)  SpO2: 100% 100%    Physical Exam  Constitutional: Appears well-developed and well-nourished.  Psych: Affect appropriate to situation Eyes: Normal external eye and conjunctiva. HENT: Normocephalic, no lesions, without obvious abnormality.   Musculoskeletal-no joint tenderness, deformity or swelling Cardiovascular: Normal rate and regular rhythm.  Respiratory: Effort normal, non-labored breathing saturations WNL on 2L [place] GI: Soft.  No distension. There is no tenderness.  Skin: WDI   Neuro:  Mental Status: Awake. Was able to say" hello" and " fine" appropriate responses to the questions asked. Did not answer any further questions.  Grimaced to painful stimuli. Intermittently following commands.  Was able to stick out her tongue and open and close eyes. Overall improved relative to our exam on 12/11. Cranial Nerves: No blink to threat bilaterally. PERRL. Tongue protrudes midline. Motor/sensory : Increased flexor tone in BUE and BLE. No increased extensor tone noted.  Grimaces to noxious in all 4 extremities.  Deep Tendon Reflexes: hypoactive biceps, 2+ patellae Cerebellar: uta Gait: uta    Medications:  Scheduled: . Chlorhexidine Gluconate Cloth  6 each Topical Q0600  . Chlorhexidine Gluconate Cloth  6 each Topical Q0600  . heparin  5,000 Units Subcutaneous Q8H  . [START ON 03/01/2019] influenza vaccine adjuvanted  0.5 mL Intramuscular Tomorrow-1000  . lidocaine  1 patch Transdermal Q24H  . mouth rinse  15 mL  Mouth Rinse BID  . multivitamin  1 tablet Oral Daily  . sodium chloride flush  3 mL Intravenous Q12H   Continuous: . sodium chloride    . piperacillin-tazobactam (ZOSYN)  IV 2.25 g (02/28/19 1348)   [id number] chloride, acetaminophen **OR** acetaminophen, albuterol, calcium carbonate (dosed in mg elemental calcium), docusate sodium, feeding supplement (NEPRO CARB STEADY), ondansetron **OR** ondansetron (ZOFRAN) IV, sodium chloride flush, sorbitol  Pertinent Labs/Diagnostics:   EEG  Result Date: 02/26/2019 [name], MD     02/26/2019  2:00 PM Patient Name: [name] MRN: [id number] Epilepsy Attending: [name] Referring Physician/Provider: Dr. Dr. Wonda Amis Date: 02/26/2019 Duration: 24.15 minutes Patient history: 68 year old female with past medical history of seizures who presented with altered mental status.  EEG to evaluate for seizures. Level of alertness: Awake/lethargic AEDs during EEG study: None Technical aspects: This EEG study was done with scalp electrodes positioned according to the 10-20 International system of electrode placement. Electrical activity was acquired at a sampling rate of 500Hz  and reviewed with a high frequency filter of 70Hz  and a low frequency filter of 1Hz . EEG data were recorded continuously and digitally stored. Description: EEG showed continuous generalized 2 to 5 Hz theta-delta slowing.  Triphasic waves, generalized, maximal bifrontal, at 2 to 2.5 Hz were also seen.  No clear posterior dominant, seen.  Hyperventilation and photic stimulation were not performed. One episode was noted around 1331 when patient was noted to be staring off.  Concomitant EEG did not show any change from baseline  EEG. Abnormality -Continuous slow, generalized -Triphasic waves, generalized, maximal bifrontal IMPRESSION: This study is suggestive of severe diffuse encephalopathy, nonspecific etiology but could be seen in setting of toxic metabolic causes,  cefepime toxicity. No seizures or definite epileptiform discharges were seen throughout the recording. One episode of staring was noted at 1331 without any clear EEG changes suggest seizure.  This was most likely a nonepileptic event in the setting of encephalopathy.   Assessment: 68 y.o. female with a PMHx of seizures (not on AED), Hep C, ESRD (on dialysis T, TH, Sat), brain aneurysm, DM and aortic stenosis who presented to [facility] on 02/25/2019 for SOB. Neurology was consulted for decreased mental status which had occurred despite having dialysis here after 2 missed HD treatments prior to admission.  1. CTH was negative for acute abnormality.  2. Patient BUN and creatinine improved s/p dialysis. Uremic encephalopathy often improves after a delay following reinitiation of dialysis. Overall presentation most consistent with metabolic encephalopathy with some underlying dementia.  3. Seizures also possible given myoclonic-like movements on exam (which may also represent a severe presentation of asterixis due to metabolic encephalopathy). EEG report conclusions: This study issuggestive of severe diffuse encephalopathy, nonspecific etiology but could be seen in setting of toxic metabolic causes, cefepime toxicity.No seizures ordefiniteepileptiform discharges were seen throughout the recording. One episode of staring was noted at 1331 without any clear EEG changes suggest seizure. This was most likely a nonepileptic event in the setting of encephalopathy 4. Cefepime neurotoxicity is also on the DDx  Recommendations: -- Continue to correct metabolic derangements as you are -- MRI brain when able.  -- Has been switched from cefepime to Zosyn by primary team -- Overall, mental status has improved since 12/11 but patient is still altered. Will need to continue to monitor for further improvement off cefepime and with HD.  -- Neurology will sign off for now. Please call with further questions or if she does not  continue to improve.  Laurey Morale, MSN, NP-C [facility] [phone]  Electronically signed: Dr. Kerney Elbe 02/28/2019, 11:56 AM

## 2019-02-28 NOTE — Progress Notes (Signed)
PROGRESS NOTE  [name] WER:[id number] DOB: January 30, 1951 DOA: 02/25/2019 PCP: Janie Morning, DO   LOS: 3 days   Brief narrative:  [name] is a 68 y.o. female with medical history significant of seizures (due to HTN x 1 in 1990s); HTN; hep C; ESRD on TTS HD; DM; ischemic colitis s/p hemicolectomy and ileostomy; and brain aneurysm presenting with SOB.  She was seen on 12/8 for pneumonia at [facility].  Admission was planned but the patient eventually decided she did not want to wait for transport.  She eventually requested outpatient evaluation and was treated with Levaquin.  She developed worsening SOB and fatigue.  She was mildly confused but was able to have a conversation (somewhat nonsensical) at the time of admission..  Throughout her ER stay while awaiting admission, she has become increasingly confused and less responsive but remained hemodynamically stable.  ED Course:  Tachycardia, tachypnea, no hypoxia.  HCAP vs. Need for HD.  Nephrology consulted .  2 negative COVID tests <36 hours ago.    Covid 19 PCR was negative.  Assessment/Plan:  Principal Problem:   Uremia Active Problems:   End stage renal disease on dialysis [facility])   Hypertension   Pneumonia    Health care associated pneumonia with hypoxic respiratory failure  continue on oxygen supplementation Continue IV antibiotics -cefepime discontinued and switched to Zosyn on account of history of seizure Follow up on culture reports   .  End-stage renal disease on hemodialysis Missed hemodialysis session.  Creatinine on presentation was 11 with BUN of 79.  Potassium was 5.8 on admission.    Routine hemodialysis per Nephrology Hyperkalemia on admission.  Improved with dialysis.  Will closely monitor.  Encephalopathy  Toxic metabolic/ infectious etiology Head CT negative  EEG without any epileptiform discharges improving    VTE Prophylaxis: Heparin subcu  Code Status: Full code  Family Communication:    Disposition Plan: TBD   Consultants:  Nephrology  Neurology  Procedures:  Hemodialysis  Antibiotics: Anti-infectives (From admission, onward)   Start     Dose/Rate Route Frequency Ordered Stop   02/27/19 1400  piperacillin-tazobactam (ZOSYN) IVPB 2.25 g     2.25 g 100 mL/hr over 30 Minutes Intravenous Every 8 hours 02/27/19 1235     02/27/19 1200  vancomycin (VANCOCIN) IVPB 500 mg/100 ml premix  Status:  Discontinued     500 mg 100 mL/hr over 60 Minutes Intravenous Every T-Th-Sa (Hemodialysis) 02/26/19 1102 02/27/19 1235   02/26/19 1200  ceFEPIme (MAXIPIME) 1 g in sodium chloride 0.9 % 100 mL IVPB  Status:  Discontinued     1 g 200 mL/hr over 30 Minutes Intravenous Every 24 hours 02/25/19 1127 02/27/19 1140   02/26/19 0000  vancomycin variable dose per unstable renal function (pharmacist dosing)  Status:  Discontinued      Does not apply See admin instructions 02/25/19 1141 02/26/19 1102   02/25/19 1130  ceFEPIme (MAXIPIME) 2 g in sodium chloride 0.9 % 100 mL IVPB     2 g 200 mL/hr over 30 Minutes Intravenous  Once 02/25/19 1126 02/25/19 1225   02/25/19 1130  vancomycin (VANCOCIN) 1,250 mg in sodium chloride 0.9 % 250 mL IVPB     1,250 mg 166.7 mL/hr over 90 Minutes Intravenous  Once 02/25/19 1127 02/25/19 1421       Subjective:  Verbalizes but remains confused  Objective: Vitals:   02/27/19 2314 02/28/19 0823  BP: 118/63 (!) 183/101  Pulse: (!) 115 (!) 123  Resp:  20   Temp: 98.2 F (36.8 C) 98.8 F (37.1 C)  SpO2: 100% 100%    Intake/Output Summary (Last 24 hours) at 02/28/2019 1121 Last data filed at 02/28/2019 0045 Gross per 24 hour  Intake 53 ml  Output 1209 ml  Net -1156 ml   Filed Weights   02/25/19 2242 02/27/19 1849 02/27/19 2241  Weight: 42.9 kg 44.9 kg 43.7 kg   Body mass index is 16.03 kg/m.   Physical Exam: GENERAL: comfortable no acute distress HEENT: No scleral pallor or icterus. Pupils equally reactive to light. Oral mucosa is dry  mucosa NECK: is supple, no palpable thyroid enlargement. CHEST:  Diminished breath sounds bilaterally. CVS: S1 and S2 heard, no murmur. Regular rate and rhythm. No pericardial rub. ABDOMEN: Soft, bowel sounds are present. EXTREMITIES: No edema. Stiff limbs CNS: Nonverbal.  None responsive to verbal communication.  Stiff extremities. SKIN: warm and dry without rashes.  Data Review: I have personally reviewed the following laboratory data and studies,  CBC: Recent Labs  Lab 02/23/19 1921 02/25/19 0807 02/26/19 0318 02/27/19 0333  WBC 15.6* 19.0* 13.7* 8.4  NEUTROABS 14.0* 15.4* 10.6*  --   HGB 9.7* 10.1* 9.9* 10.3*  HCT 29.4* 30.6* 28.6* 30.1*  MCV 83.8 82.3 79.4* 80.7  PLT 295 359 398 160*   Basic Metabolic Panel: Recent Labs  Lab 02/23/19 1921 02/25/19 0807 02/26/19 0318 02/27/19 0333  NA 132* 136 136 135  K 5.0 5.8* 4.5 4.5  CL 90* 93* 95* 92*  CO2 23 20* 24 25  GLUCOSE 119* 97 75 101*  BUN 60* 79* 24* 32*  CREATININE 10.11* 11.91* 5.69* 7.63*  CALCIUM 7.8* 8.0* 8.7* 9.0  MG  --  2.3  --  2.1  PHOS  --  4.3  --  4.6   Liver Function Tests: Recent Labs  Lab 02/23/19 1921 02/25/19 0807 02/27/19 0333  AST 22 25 13*  ALT _0 ALKPHOS 85 95 74  BILITOT 1.2 1.5* 1.1  PROT 7.7 8.1 7.6  ALBUMIN 3.4* 3.1* 2.8*   No results for input(s): LIPASE, AMYLASE in the last 168 hours. Recent Labs  Lab 02/26/19 1056  AMMONIA 13   Cardiac Enzymes: No results for input(s): CKTOTAL, CKMB, CKMBINDEX, TROPONINI in the last 168 hours. BNP (last 3 results) Recent Labs    02/23/19 1921  BNP 488.5*    ProBNP (last 3 results) No results for input(s): PROBNP in the last 8760 hours.  CBG: Recent Labs  Lab 02/27/19 0352 02/27/19 0606 02/27/19 0724 02/27/19 0952 02/27/19 1154  GLUCAP 96 100* 99 84 88   Recent Results (from the past 240 hour(s))  SARS Coronavirus 2 Ag (30 min TAT) - Nasal Swab (BD Veritor Kit)     Status: None   Collection Time: 02/23/19  7:21  PM   Specimen: Nasal Swab (BD Veritor Kit)  Result Value Ref Range Status   SARS Coronavirus 2 Ag NEGATIVE NEGATIVE Final    Comment: (NOTE) SARS-CoV-2 antigen NOT DETECTED.  Negative results are presumptive.  Negative results do not preclude SARS-CoV-2 infection and should not be used as the sole basis for treatment or other patient management decisions, including infection  control decisions, particularly in the presence of clinical signs and  symptoms consistent with COVID-19, or in those who have been in contact with the virus.  Negative results must be combined with clinical observations, patient history, and epidemiological information. The expected result is Negative. Fact Sheet for Patients: PodPark.tn Fact Sheet  for Healthcare Providers: GiftContent.is This test is not yet approved or cleared by the [place] and  has been authorized for detection and/or diagnosis of SARS-CoV-2 by FDA under an Emergency Use Authorization (EUA).  This EUA will remain in effect (meaning this test can be used) for the duration of  the COVID-19 de claration under Section 564(b)(1) of the Act, 21 U.S.C. section 360bbb-3(b)(1), unless the authorization is terminated or revoked sooner. Performed at [facility], [address] [place], [place] [address]   Culture, blood (Routine X 2) w Reflex to ID Panel     Status: None (Preliminary result)   Collection Time: 02/23/19  7:25 PM   Specimen: BLOOD  Result Value Ref Range Status   Specimen Description   Final    BLOOD BLOOD LEFT WRIST Performed at [facility], [address] [place], [place] [address]    Special Requests   Final    BOTTLES DRAWN AEROBIC AND ANAEROBIC Blood Culture adequate volume Performed at [facility], [address] [place], [place] [address]    Culture   Final    NO GROWTH 4 DAYS Performed at [facility],  [place]  [address] [place], [place] [address]    Report Status PENDING  Incomplete  Culture, blood (Routine X 2) w Reflex to ID Panel     Status: None (Preliminary result)   Collection Time: 02/23/19  7:30 PM   Specimen: BLOOD  Result Value Ref Range Status   Specimen Description   Final    BLOOD LEFT ANTECUBITAL Performed at [facility], [address] [place], [place] [address]    Special Requests   Final    BOTTLES DRAWN AEROBIC AND ANAEROBIC Blood Culture adequate volume Performed at [facility], [address] [place], [place] [address]    Culture   Final    NO GROWTH 4 DAYS Performed at [facility], [place] [address] [place], [place] [address]    Report Status PENDING  Incomplete  SARS CORONAVIRUS 2 (TAT 6-24 HRS) Nasopharyngeal Nasopharyngeal Swab     Status: None   Collection Time: 02/23/19 10:06 PM   Specimen: Nasopharyngeal Swab  Result Value Ref Range Status   SARS Coronavirus 2 NEGATIVE NEGATIVE Final    Comment: (NOTE) SARS-CoV-2 target nucleic acids are NOT DETECTED. The SARS-CoV-2 RNA is generally detectable in upper and lower respiratory specimens during the acute phase of infection. Negative results do not preclude SARS-CoV-2 infection, do not rule out co-infections with other pathogens, and should not be used as the sole basis for treatment or other patient management decisions. Negative results must be combined with clinical observations, patient history, and epidemiological information. The expected result is Negative. Fact Sheet for Patients: SugarRoll.be Fact Sheet for Healthcare Providers: https://www.woods-mathews.com/ This test is not yet approved or cleared by the [place] FDA and  has been authorized for detection and/or diagnosis of SARS-CoV-2 by FDA under an Emergency Use Authorization (EUA). This EUA will remain  in effect (meaning this test can be used) for the duration of  the COVID-19 declaration under Section 56 4(b)(1) of the Act, 21 U.S.C. section 360bbb-3(b)(1), unless the authorization is terminated or revoked sooner. Performed at [facility], [place] [address] [place], [place] [address]   Respiratory Panel by RT PCR (Flu A&B, Covid) - Nasopharyngeal Swab     Status: None   Collection Time: 02/25/19  11:01 AM   Specimen: Nasopharyngeal Swab  Result Value Ref Range Status   SARS Coronavirus 2 by RT PCR NEGATIVE NEGATIVE Final    Comment: (NOTE) SARS-CoV-2 target nucleic acids are NOT DETECTED. The SARS-CoV-2 RNA is generally detectable in upper respiratoy specimens during the acute phase of infection. The lowest concentration of SARS-CoV-2 viral copies this assay can detect is 131 copies/mL. A negative result does not preclude SARS-Cov-2 infection and should not be used as the sole basis for treatment or other patient management decisions. A negative result may occur with  improper specimen collection/handling, submission of specimen other than nasopharyngeal swab, presence of viral mutation(s) within the areas targeted by this assay, and inadequate number of viral copies (<131 copies/mL). A negative result must be combined with clinical observations, patient history, and epidemiological information. The expected result is Negative. Fact Sheet for Patients:  PinkCheek.be Fact Sheet for Healthcare Providers:  GravelBags.it This test is not yet ap proved or cleared by the [place] FDA and  has been authorized for detection and/or diagnosis of SARS-CoV-2 by FDA under an Emergency Use Authorization (EUA). This EUA will remain  in effect (meaning this test can be used) for the duration of the COVID-19 declaration under Section 564(b)(1) of the Act, 21 U.S.C. section 360bbb-3(b)(1), unless the authorization is terminated or revoked sooner.    Influenza A by PCR NEGATIVE NEGATIVE  Final   Influenza B by PCR NEGATIVE NEGATIVE Final    Comment: (NOTE) The Xpert Xpress SARS-CoV-2/FLU/RSV assay is intended as an aid in  the diagnosis of influenza from Nasopharyngeal swab specimens and  should not be used as a sole basis for treatment. Nasal washings and  aspirates are unacceptable for Xpert Xpress SARS-CoV-2/FLU/RSV  testing. Fact Sheet for Patients: PinkCheek.be Fact Sheet for Healthcare Providers: GravelBags.it This test is not yet approved or cleared by the [place] FDA and  has been authorized for detection and/or diagnosis of SARS-CoV-2 by  FDA under an Emergency Use Authorization (EUA). This EUA will remain  in effect (meaning this test can be used) for the duration of the  Covid-19 declaration under Section 564(b)(1) of the Act, 21  U.S.C. section 360bbb-3(b)(1), unless the authorization is  terminated or revoked. Performed at [facility], [place] [address], [place], [place] [address]   Culture, blood (routine x 2) Call MD if unable to obtain prior to antibiotics being given     Status: None (Preliminary result)   Collection Time: 02/25/19  1:00 PM   Specimen: BLOOD  Result Value Ref Range Status   Specimen Description BLOOD LEFT ANTECUBITAL  Final   Special Requests   Final    BOTTLES DRAWN AEROBIC AND ANAEROBIC Blood Culture adequate volume   Culture   Final    NO GROWTH 3 DAYS Performed at [facility], [place] [address] [place], [place] [address]    Report Status PENDING  Incomplete  Culture, blood (routine x 2) Call MD if unable to obtain prior to antibiotics being given     Status: None (Preliminary result)   Collection Time: 02/25/19  1:16 PM   Specimen: BLOOD  Result Value Ref Range Status   Specimen Description BLOOD BLOOD LEFT FOREARM  Final   Special Requests   Final    BOTTLES DRAWN AEROBIC AND ANAEROBIC Blood Culture results may not be optimal due to an inadequate  volume of blood received in culture bottles   Culture   Final    NO GROWTH  3 DAYS Performed at [facility], [place] [address], [place], [place] [address]    Report Status PENDING  Incomplete  MRSA PCR Screening     Status: None   Collection Time: 02/26/19  2:08 AM   Specimen: Nasopharyngeal  Result Value Ref Range Status   MRSA by PCR NEGATIVE NEGATIVE Final    Comment:        The GeneXpert MRSA Assay (FDA approved for NASAL specimens only), is one component of a comprehensive MRSA colonization surveillance program. It is not intended to diagnose MRSA infection nor to guide or monitor treatment for MRSA infections. Performed at [facility], [place] [address] [place], [place] [address]      Studies: EEG  Result Date: 02/26/2019 [name], MD     02/26/2019  2:00 PM Patient Name: CHAQUETTA SCHLOTTMAN MRN: [id number] Epilepsy Attending: [name] Referring Physician/Provider: Dr. Dr. Wonda Amis Date: 02/26/2019 Duration: 24.15 minutes Patient history: 68 year old female with past medical history of seizures who presented with altered mental status.  EEG to evaluate for seizures. Level of alertness: Awake/lethargic AEDs during EEG study: None Technical aspects: This EEG study was done with scalp electrodes positioned according to the 10-20 International system of electrode placement. Electrical activity was acquired at a sampling rate of 500Hz and reviewed with a high frequency filter of 70Hz and a low frequency filter of 1Hz. EEG data were recorded continuously and digitally stored. Description: EEG showed continuous generalized 2 to 5 Hz theta-delta slowing.  Triphasic waves, generalized, maximal bifrontal, at 2 to 2.5 Hz were also seen.  No clear posterior dominant, seen.  Hyperventilation and photic stimulation were not performed. One episode was noted around 1331 when patient was noted to be staring off.  Concomitant EEG did not show any change from baseline  EEG. Abnormality -Continuous slow, generalized -Triphasic waves, generalized, maximal bifrontal IMPRESSION: This study is suggestive of severe diffuse encephalopathy, nonspecific etiology but could be seen in setting of toxic metabolic causes, cefepime toxicity. No seizures or definite epileptiform discharges were seen throughout the recording. One episode of staring was noted at 1331 without any clear EEG changes suggest seizure.  This was most likely a nonepileptic event in the setting of encephalopathy.    Scheduled Meds:  Chlorhexidine Gluconate Cloth  6 each Topical Q0600   Chlorhexidine Gluconate Cloth  6 each Topical Q0600   heparin  5,000 Units Subcutaneous Q8H   [START ON 03/01/2019] influenza vaccine adjuvanted  0.5 mL Intramuscular Tomorrow-1000   lidocaine  1 patch Transdermal Q24H   mouth rinse  15 mL Mouth Rinse BID   multivitamin  1 tablet Oral Daily   sodium chloride flush  3 mL Intravenous Q12H    Continuous Infusions:  sodium chloride     piperacillin-tazobactam (ZOSYN)  IV 2.25 g (02/28/19 6812)     Benito Mccreedy, MD  [facility] 02/28/2019

## 2019-02-28 NOTE — Progress Notes (Signed)
[facility] Progress Note  Subjective: a little better today, trying to answer questions. Had HD overnight, 1.2L off , BP's down to 90's briefly.   Vitals:   02/27/19 2230 02/27/19 2241 02/27/19 2314 02/28/19 0823  BP: 131/69 138/82 118/63 (!) 183/101  Pulse: 87 94 (!) 115 (!) 123  Resp:  18 20   Temp:  98.6 F (37 C) 98.2 F (36.8 C) 98.8 F (37.1 C)  TempSrc:  Axillary Oral Axillary  SpO2:  100% 100% 100%  Weight:  43.7 kg    Height:        Inpatient medications: . Chlorhexidine Gluconate Cloth  6 each Topical Q0600  . Chlorhexidine Gluconate Cloth  6 each Topical Q0600  . heparin  5,000 Units Subcutaneous Q8H  . [START ON 03/01/2019] influenza vaccine adjuvanted  0.5 mL Intramuscular Tomorrow-1000  . lidocaine  1 patch Transdermal Q24H  . mouth rinse  15 mL Mouth Rinse BID  . multivitamin  1 tablet Oral Daily  . sodium chloride flush  3 mL Intravenous Q12H   . sodium chloride    . piperacillin-tazobactam (ZOSYN)  IV 2.25 g (02/28/19 1348)   sodium chloride, acetaminophen **OR** acetaminophen, albuterol, calcium carbonate (dosed in mg elemental calcium), docusate sodium, feeding supplement (NEPRO CARB STEADY), ondansetron **OR** ondansetron (ZOFRAN) IV, sodium chloride flush, sorbitol    Exam: General: very frail, trying to speak today but not doing too well , less lethargic No JVD.  Lungs: Clear bilaterally to auscultation  Heart: RRR with normal S1, S2. No RG. 3/6 SEM at apex Abdomen: Soft, non-tender Musculoskeletal:  Reduced muscle mass Lower extremities: No edema or gangrene Neuro: Confused, not oriented Dialysis Access: RUE AVG + thrill    Dialysis: TTS at [facility] 3.5h  400/A1.5   44.5kg    2K/2.5Ca bath   P4    RUE AVG   Hep 1500 - Mircera 30 q 2 weeks (last 11/23) - Hectoral 12mcg IV q HD  Assessment/Plan: 1.  AMS: CT head negative. Seen by neuro, EEG c/w severe encephalopathy, no evidence seizures. Seems to be improving w/ time and HD x 2  here.   2. Pneumonia: wbc and fevers down, D# 5 IV abx (vanc/ zosyn) . Cefepime dc'd given delirium. COVID/Flu negative, blood cx's neg.  3.  ESRD: HD TTS.  Next HD 12/15.   4.  Hypertension/volume: BP good, under dry wt. No vol ^ on exam.   5.  Anemia: Hgb 10.1 - not due for ESA yet. 6.  Metabolic bone disease: Ca/Phos ok - follow. 7.  FTT - will consult palliative care for [place], pt having the "dwindles" and has been declining for the last 6-12 mos according to OP HD staff.   Rob Doctor, hospital 02/28/2019, 3:31 PM  Iron/TIBC/Ferritin/ %Sat    Component Value Date/Time   IRON 35 (L) 03/16/2011 1549   TIBC 167 (L) 03/16/2011 1549   FERRITIN 1,410 (H) 02/23/2019 1921   IRONPCTSAT 21 03/16/2011 1549   Recent Labs  Lab 02/23/19 2030 02/27/19 0333  NA  --  135  K  --  4.5  CL  --  92*  CO2  --  25  GLUCOSE  --  101*  BUN  --  32*  CREATININE  --  7.63*  CALCIUM  --  9.0  PHOS  --  4.6  ALBUMIN  --  2.8*  INR 1.3*  --    Recent Labs  Lab 02/27/19 0333  AST 13*  ALT 10  ALKPHOS 74  BILITOT 1.1  PROT 7.6   Recent Labs  Lab 02/27/19 0333  WBC 8.4  HGB 10.3*  HCT 30.1*  PLT 425*

## 2019-03-01 DIAGNOSIS — Z515 Encounter for palliative care: Secondary | ICD-10-CM

## 2019-03-01 DIAGNOSIS — Z7189 Other specified counseling: Secondary | ICD-10-CM

## 2019-03-01 DIAGNOSIS — E875 Hyperkalemia: Secondary | ICD-10-CM

## 2019-03-01 LAB — RENAL FUNCTION PANEL
Albumin: 2.9 g/dL — ABNORMAL LOW (ref 3.5–5.0)
Anion gap: 21 — ABNORMAL HIGH (ref 5–15)
BUN: 40 mg/dL — ABNORMAL HIGH (ref 8–23)
CO2: 24 mmol/L (ref 22–32)
Calcium: 8.4 mg/dL — ABNORMAL LOW (ref 8.9–10.3)
Chloride: 99 mmol/L (ref 98–111)
Creatinine, Ser: 7.55 mg/dL — ABNORMAL HIGH (ref 0.44–1.00)
GFR calc Af Amer: 6 mL/min — ABNORMAL LOW (ref >60)
GFR calc non Af Amer: 5 mL/min — ABNORMAL LOW (ref >60)
Glucose, Bld: 93 mg/dL (ref 70–99)
Phosphorus: 6.8 mg/dL — ABNORMAL HIGH (ref 2.5–4.6)
Potassium: 4.4 mmol/L (ref 3.5–5.1)
Sodium: 144 mmol/L (ref 135–145)

## 2019-03-01 LAB — CBC WITH DIFFERENTIAL/PLATELET
Abs Immature Granulocytes: 0.34 10*3/uL — ABNORMAL HIGH (ref 0.00–0.07)
Basophils Absolute: 0.1 10*3/uL (ref 0.0–0.1)
Basophils Relative: 1 %
Eosinophils Absolute: 0.2 10*3/uL (ref 0.0–0.5)
Eosinophils Relative: 2 %
HCT: 31.3 % — ABNORMAL LOW (ref 36.0–46.0)
Hemoglobin: 10.4 g/dL — ABNORMAL LOW (ref 12.0–15.0)
Immature Granulocytes: 3 %
Lymphocytes Relative: 13 %
Lymphs Abs: 1.8 10*3/uL (ref 0.7–4.0)
MCH: 27.1 pg (ref 26.0–34.0)
MCHC: 33.2 g/dL (ref 30.0–36.0)
MCV: 81.5 fL (ref 80.0–100.0)
Monocytes Absolute: 1.6 10*3/uL — ABNORMAL HIGH (ref 0.1–1.0)
Monocytes Relative: 12 %
Neutro Abs: 9.4 10*3/uL — ABNORMAL HIGH (ref 1.7–7.7)
Neutrophils Relative %: 69 %
Platelets: 437 10*3/uL — ABNORMAL HIGH (ref 150–400)
RBC: 3.84 MIL/uL — ABNORMAL LOW (ref 3.87–5.11)
RDW: 16.1 % — ABNORMAL HIGH (ref 11.5–15.5)
WBC: 13.4 10*3/uL — ABNORMAL HIGH (ref 4.0–10.5)
nRBC: 0 % (ref 0.0–0.2)

## 2019-03-01 LAB — CULTURE, BLOOD (ROUTINE X 2)
Culture: NO GROWTH
Culture: NO GROWTH
Special Requests: ADEQUATE
Special Requests: ADEQUATE

## 2019-03-01 LAB — GLUCOSE, CAPILLARY
Glucose-Capillary: 100 mg/dL — ABNORMAL HIGH (ref 70–99)
Glucose-Capillary: 100 mg/dL — ABNORMAL HIGH (ref 70–99)
Glucose-Capillary: 100 mg/dL — ABNORMAL HIGH (ref 70–99)
Glucose-Capillary: 102 mg/dL — ABNORMAL HIGH (ref 70–99)
Glucose-Capillary: 102 mg/dL — ABNORMAL HIGH (ref 70–99)
Glucose-Capillary: 102 mg/dL — ABNORMAL HIGH (ref 70–99)
Glucose-Capillary: 105 mg/dL — ABNORMAL HIGH (ref 70–99)
Glucose-Capillary: 108 mg/dL — ABNORMAL HIGH (ref 70–99)
Glucose-Capillary: 109 mg/dL — ABNORMAL HIGH (ref 70–99)
Glucose-Capillary: 113 mg/dL — ABNORMAL HIGH (ref 70–99)
Glucose-Capillary: 114 mg/dL — ABNORMAL HIGH (ref 70–99)
Glucose-Capillary: 60 mg/dL — ABNORMAL LOW (ref 70–99)
Glucose-Capillary: 69 mg/dL — ABNORMAL LOW (ref 70–99)
Glucose-Capillary: 71 mg/dL (ref 70–99)
Glucose-Capillary: 73 mg/dL (ref 70–99)
Glucose-Capillary: 80 mg/dL (ref 70–99)
Glucose-Capillary: 81 mg/dL (ref 70–99)
Glucose-Capillary: 81 mg/dL (ref 70–99)
Glucose-Capillary: 88 mg/dL (ref 70–99)
Glucose-Capillary: 89 mg/dL (ref 70–99)
Glucose-Capillary: 92 mg/dL (ref 70–99)
Glucose-Capillary: 92 mg/dL (ref 70–99)
Glucose-Capillary: 97 mg/dL (ref 70–99)

## 2019-03-01 MED ORDER — OXYCODONE-ACETAMINOPHEN 5-325 MG PO TABS
1.0000 | ORAL_TABLET | Freq: Three times a day (TID) | ORAL | Status: DC | PRN
  Administered 2019-03-01 – 2019-03-02 (×2): 1 via ORAL
  Filled 2019-03-01 (×3): qty 1

## 2019-03-01 MED ORDER — CHLORHEXIDINE GLUCONATE CLOTH 2 % EX PADS
6.0000 | MEDICATED_PAD | Freq: Every day | CUTANEOUS | Status: DC
  Administered 2019-03-02 – 2019-03-03 (×2): 6 via TOPICAL

## 2019-03-01 MED ORDER — MORPHINE SULFATE (PF) 2 MG/ML IV SOLN
1.0000 mg | INTRAVENOUS | Status: DC | PRN
  Administered 2019-03-01 (×2): 1 mg via INTRAVENOUS
  Filled 2019-03-01 (×2): qty 1

## 2019-03-01 MED ORDER — FENTANYL CITRATE (PF) 100 MCG/2ML IJ SOLN
25.0000 ug | INTRAMUSCULAR | Status: DC | PRN
  Administered 2019-03-02 – 2019-03-04 (×5): 25 ug via INTRAVENOUS
  Filled 2019-03-01 (×5): qty 2

## 2019-03-01 MED ORDER — MORPHINE SULFATE (PF) 2 MG/ML IV SOLN: 2.0000 mg | Freq: Once | INTRAVENOUS | Status: DC

## 2019-03-01 NOTE — Progress Notes (Signed)
[facility] Progress Note  Subjective:  Last HD on 12/12 with 1.2 kg UF.  States she feels more awake today and nursing tech agrees.   Review of systems:  Has been NPO  Denies n/v No chest pain  States some shortness of breawth   Vitals:   02/28/19 0823 02/28/19 1500 02/28/19 1549 02/28/19 2230  BP: (!) 183/101  114/80 (!) 148/84  Pulse: (!) 123  (!) 123   Resp:  18    Temp: 98.8 F (37.1 C)  98.7 F (37.1 C) 98.6 F (37 C)  TempSrc: Axillary  Axillary Axillary  SpO2: 100%  100% 100%  Weight:      Height:        Inpatient medications: . Chlorhexidine Gluconate Cloth  6 each Topical Q0600  . Chlorhexidine Gluconate Cloth  6 each Topical Q0600  . heparin  5,000 Units Subcutaneous Q8H  . influenza vaccine adjuvanted  0.5 mL Intramuscular Tomorrow-1000  . lidocaine  1 patch Transdermal Q24H  . mouth rinse  15 mL Mouth Rinse BID  . multivitamin  1 tablet Oral Daily  . sodium chloride flush  3 mL Intravenous Q12H   . sodium chloride    . piperacillin-tazobactam (ZOSYN)  IV 2.25 g (03/01/19 0644)   sodium chloride, acetaminophen **OR** acetaminophen, albuterol, calcium carbonate (dosed in mg elemental calcium), docusate sodium, feeding supplement (NEPRO CARB STEADY), morphine injection, ondansetron **OR** ondansetron (ZOFRAN) IV, sodium chloride flush, sorbitol    Exam: General: frail elderly female in bed in NAD at rest Neck No JVD. Trachea midline  Lungs: Clear bilaterally to auscultation unlabored  Heart: S1S2 tachycardic; no rub; no edema Abdomen: Soft, non-tender, ND Musculoskeletal:  Reduced muscle mass on gross exam  Neuro: provides limited history; oriented to year and person but delayed response to year Dialysis Access: RUE AVG + thrill and bruit    Dialysis: TTS at [facility] 3.5h  400/A1.5   44.5kg    2K/2.5Ca bath   P4    RUE AVG   Hep 1500 - Mircera 30 q 2 weeks (last 11/23) - Hectoral 30mcg IV q HD  Assessment/Plan: 1.  AMS: CT head  negative. Seen by neuro, EEG c/w severe encephalopathy, no evidence seizures. Improving per report  2. Pneumonia: leukocytosis and fevers down, abx per primary team. Note cefepime dc'd given delirium. COVID/Flu negative, blood cx's NGTD  3.  ESRD: HD TTS schedule. Ordered labs - will follow renal panel today  4.  Hypertension/volume: s/p UF with HD as above. Normotensive today  5.  Anemia: Hb 10.3 last check   6.  Metabolic bone disease: Ca/Phos ok - follow. 7.  FTT - palliative care consulted for [place], patient declining for the last 6-12 mos according to OP HD staff.    Claudia Desanctis 03/01/2019 8:05 AM    Iron/TIBC/Ferritin/ %Sat    Component Value Date/Time   IRON 35 (L) 03/16/2011 1549   TIBC 167 (L) 03/16/2011 1549   FERRITIN 1,410 (H) 02/23/2019 1921   IRONPCTSAT 21 03/16/2011 1549   Recent Labs  Lab 02/23/19 2030 02/27/19 0333  NA  --  135  K  --  4.5  CL  --  92*  CO2  --  25  GLUCOSE  --  101*  BUN  --  32*  CREATININE  --  7.63*  CALCIUM  --  9.0  PHOS  --  4.6  ALBUMIN  --  2.8*  INR 1.3*  --    Recent Labs  Lab 02/27/19 0333  AST 13*  ALT 10  ALKPHOS 74  BILITOT 1.1  PROT 7.6   Recent Labs  Lab 02/27/19 0333  WBC 8.4  HGB 10.3*  HCT 30.1*  PLT 425*

## 2019-03-01 NOTE — Progress Notes (Addendum)
[facility]  PROGRESS NOTE    EIKO MCGOWEN  WPY:[id number] DOB: 1950-12-29 DOA: 02/25/2019 PCP: Janie Morning, DO   Brief Narrative:   GOLDIA LIGMAN a 68 y.o.femalewith medical history significant ofseizures(due to HTN x 1 in 1990s); HTN; hep C; ESRD on TTS HD; DM; ischemic colitis s/p hemicolectomy and ileostomy; and brain aneurysm presenting with SOB.She [facility]. Admission was planned but the patient eventually decided she did not want to wait for transport. She eventually requested outpatient evaluation and was treated with Levaquin. She developed worsening SOB and fatigue. She was mildly confused but was able to have a conversation (somewhat nonsensical) at the time of admission.. Throughout her ER stay while awaiting admission, she has become increasingly confused and less responsive but remained hemodynamically stable.  03/01/19: Let's get SLP consult. WBC up, but afebrile. Also check TSH.    Assessment & Plan:   Principal Problem:   Uremia Active Problems:   End stage renal disease on dialysis [facility])   Hypertension   Pneumonia  Health care associated pneumonia with hypoxic respiratory failure     - SpO2 99% on RA     - this is Day 5 of (cefepime/zosyn); continue through 03/03/19     - 02/25/19 Bld Cx NTD     - afebrile     - persistently tachy since she has been here, check TSH   ESRD on HD     - Missed hemodialysis sessions     - per neprho  Hyperkalemia     - resolved with HD  Encephalopathy     - Toxic metabolic/ infectious etiology     - Head CT negative      - EEG without any epileptiform discharges     - she is improved; monitor  Dysphagia?     - SLP consulted  Chronic pain     - pain meds possibly contributed to encephalopathy??     - complains of b/l leg pain     - can start back meds at lower dose  DVT prophylaxis: heparin Code Status: FULL   Disposition Plan: TBD  Consultants:    Nephrology  Antimicrobials:  . zosyn   Subjective: "I'm hurting"  Objective: Vitals:   02/28/19 1500 02/28/19 1549 02/28/19 2230 03/01/19 0813  BP:  114/80 (!) 148/84 120/75  Pulse:  (!) 123  61  Resp: 18     Temp:  98.7 F (37.1 C) 98.6 F (37 C) 98.6 F (37 C)  TempSrc:  Axillary Axillary Oral  SpO2:  100% 100% 99%  Weight:      Height:        Intake/Output Summary (Last 24 hours) at 03/01/2019 1553 Last data filed at 02/28/2019 2330 Gross per 24 hour  Intake 50 ml  Output --  Net 50 ml   Filed Weights   02/25/19 2242 02/27/19 1849 02/27/19 2241  Weight: 42.9 kg 44.9 kg 43.7 kg    Examination:  General: 68 y.o. female resting in bed in NAD Cardiovascular: RRR, +S1, S2, no m/g/r, equal pulses throughout Respiratory: soft rhonchi right side, normal WOB GI: BS+, NDNT, no masses noted, no organomegaly noted MSK: No e/c/c Neuro: alert to name, follows commands   Data Reviewed: I have personally reviewed following labs and imaging studies.  CBC: Recent Labs  Lab 02/23/19 1921 02/25/19 0807 02/26/19 0318 02/27/19 0333 03/01/19 0839  WBC 15.6* 19.0* 13.7* 8.4 13.4*  NEUTROABS 14.0* 15.4* 10.6*  --  9.4*  HGB 9.7* 10.1* 9.9* 10.3* 10.4*  HCT 29.4* 30.6* 28.6* 30.1* 31.3*  MCV 83.8 82.3 79.4* 80.7 81.5  PLT 295 359 398 425* 615*   Basic Metabolic Panel: Recent Labs  Lab 02/23/19 1921 02/25/19 0807 02/26/19 0318 02/27/19 0333 03/01/19 0839  NA 132* 136 136 135 144  K 5.0 5.8* 4.5 4.5 4.4  CL 90* 93* 95* 92* 99  CO2 23 20* _0 GLUCOSE 119* 97 75 101* 93  BUN 60* 79* 24* 32* 40*  CREATININE 10.11* 11.91* 5.69* 7.63* 7.55*  CALCIUM 7.8* 8.0* 8.7* 9.0 8.4*  MG  --  2.3  --  2.1  --   PHOS  --  4.3  --  4.6 6.8*   GFR: Estimated Creatinine Clearance: 4.9 mL/min (A) (by C-G formula based on SCr of 7.55 mg/dL (H)). Liver Function Tests: Recent Labs  Lab 02/23/19 1921 02/25/19 0807 02/27/19 0333 03/01/19 0839  AST 22 25 13*  --    ALT _1 --   ALKPHOS 85 95 74  --   BILITOT 1.2 1.5* 1.1  --   PROT 7.7 8.1 7.6  --   ALBUMIN 3.4* 3.1* 2.8* 2.9*   No results for input(s): LIPASE, AMYLASE in the last 168 hours. Recent Labs  Lab 02/26/19 1056  AMMONIA 13   Coagulation Profile: Recent Labs  Lab 02/23/19 2030  INR 1.3*   Cardiac Enzymes: No results for input(s): CKTOTAL, CKMB, CKMBINDEX, TROPONINI in the last 168 hours. BNP (last 3 results) No results for input(s): PROBNP in the last 8760 hours. HbA1C: No results for input(s): HGBA1C in the last 72 hours. CBG: Recent Labs  Lab 03/01/19 0634 03/01/19 0636 03/01/19 0810 03/01/19 1008 03/01/19 1149  GLUCAP 60* 92 92 81 69*   Lipid Profile: No results for input(s): CHOL, HDL, LDLCALC, TRIG, CHOLHDL, LDLDIRECT in the last 72 hours. Thyroid Function Tests: No results for input(s): TSH, T4TOTAL, FREET4, T3FREE, THYROIDAB in the last 72 hours. Anemia Panel: No results for input(s): VITAMINB12, FOLATE, FERRITIN, TIBC, IRON, RETICCTPCT in the last 72 hours. Sepsis Labs: Recent Labs  Lab 02/23/19 1921 02/23/19 2040 02/23/19 2340  PROCALCITON  --  51.53  --   LATICACIDVEN 2.1*  --  2.1*    Recent Results (from the past 240 hour(s))  Novel Coronavirus, NAA ([facility])     Status: None   Collection Time: 02/23/19 12:00 AM   Specimen: Nasopharyngeal(NP) swabs in vial transport medium   NASOPHARYNGE  TESTING  Result Value Ref Range Status   SARS-CoV-2, NAA Not Detected Not Detected Final    Comment: This nucleic acid amplification test was developed and its performance characteristics determined by [employer]. Nucleic acid amplification tests include PCR and TMA. This test has not been FDA cleared or approved. This test has been authorized by FDA under an Emergency Use Authorization (EUA). This test is only authorized for the duration of time the declaration that circumstances exist justifying the authorization of the emergency use of in  vitro diagnostic tests for detection of SARS-CoV-2 virus and/or diagnosis of COVID-19 infection under section 564(b)(1) of the Act, 21 U.S.C. 379KFE-7(M) (1), unless the authorization is terminated or revoked sooner. When diagnostic testing is negative, the possibility of a false negative result should be considered in the context of a patient's recent exposures and the presence of clinical signs and symptoms consistent with COVID-19. An individual without symptoms of COVID-19 and who is not shedding SARS-CoV-2 virus would  expect to have a negative (not detected) result in this assay.   SARS Coronavirus 2 Ag (30 min TAT) - Nasal Swab (BD Veritor Kit)     Status: None   Collection Time: 02/23/19  7:21 PM   Specimen: Nasal Swab (BD Veritor Kit)  Result Value Ref Range Status   SARS Coronavirus 2 Ag NEGATIVE NEGATIVE Final    Comment: (NOTE) SARS-CoV-2 antigen NOT DETECTED.  Negative results are presumptive.  Negative results do not preclude SARS-CoV-2 infection and should not be used as the sole basis for treatment or other patient management decisions, including infection  control decisions, particularly in the presence of clinical signs and  symptoms consistent with COVID-19, or in those who have been in contact with the virus.  Negative results must be combined with clinical observations, patient history, and epidemiological information. The expected result is Negative. Fact Sheet for Patients: PodPark.tn Fact Sheet for Healthcare Providers: GiftContent.is This test is not yet approved or cleared by the [place] FDA and  has been authorized for detection and/or diagnosis of SARS-CoV-2 by FDA under an Emergency Use Authorization (EUA).  This EUA will remain in effect (meaning this test can be used) for the duration of  the COVID-19 de claration under Section 564(b)(1) of the Act, 21 U.S.C. section 360bbb-3(b)(1),  unless the authorization is terminated or revoked sooner. Performed at [facility], [address] [place], [place] [address]   Culture, blood (Routine X 2) w Reflex to ID Panel     Status: None   Collection Time: 02/23/19  7:25 PM   Specimen: BLOOD  Result Value Ref Range Status   Specimen Description   Final    BLOOD BLOOD LEFT WRIST Performed at [facility], [address] [place], [place] [address]    Special Requests   Final    BOTTLES DRAWN AEROBIC AND ANAEROBIC Blood Culture adequate volume Performed at [facility], [address] [place], [place] [address]    Culture   Final    NO GROWTH 5 DAYS Performed at [facility], [place] [address], [place], [place] [address]    Report Status 03/01/2019 FINAL  Final  Culture, blood (Routine X 2) w Reflex to ID Panel     Status: None   Collection Time: 02/23/19  7:30 PM   Specimen: BLOOD  Result Value Ref Range Status   Specimen Description   Final    BLOOD LEFT ANTECUBITAL Performed at [facility], [address] [place], [place] [address]    Special Requests   Final    BOTTLES DRAWN AEROBIC AND ANAEROBIC Blood Culture adequate volume Performed at [facility], [address], [place], [place] [address]    Culture   Final    NO GROWTH 5 DAYS Performed at [facility], [place] [address], [place], [place] [address]    Report Status 03/01/2019 FINAL  Final  SARS CORONAVIRUS 2 (TAT 6-24 HRS) Nasopharyngeal Nasopharyngeal Swab     Status: None   Collection Time: 02/23/19 10:06 PM   Specimen: Nasopharyngeal Swab  Result Value Ref Range Status   SARS Coronavirus 2 NEGATIVE NEGATIVE Final    Comment: (NOTE) SARS-CoV-2 target nucleic acids are NOT DETECTED. The SARS-CoV-2 RNA is generally detectable in upper and lower respiratory specimens during the acute phase of infection. Negative results do not preclude SARS-CoV-2 infection, do not rule out co-infections  with other  pathogens, and should not be used as the sole basis for treatment or other patient management decisions. Negative results must be combined with clinical observations, patient history, and epidemiological information. The expected result is Negative. Fact Sheet for Patients: SugarRoll.be Fact Sheet for Healthcare Providers: https://www.woods-mathews.com/ This test is not yet approved or cleared by the [place] FDA and  has been authorized for detection and/or diagnosis of SARS-CoV-2 by FDA under an Emergency Use Authorization (EUA). This EUA will remain  in effect (meaning this test can be used) for the duration of the COVID-19 declaration under Section 56 4(b)(1) of the Act, 21 U.S.C. section 360bbb-3(b)(1), unless the authorization is terminated or revoked sooner. Performed at [facility], [place] [address] [place], [place] [address]   Respiratory Panel by RT PCR (Flu A&B, Covid) - Nasopharyngeal Swab     Status: None   Collection Time: 02/25/19 11:01 AM   Specimen: Nasopharyngeal Swab  Result Value Ref Range Status   SARS Coronavirus 2 by RT PCR NEGATIVE NEGATIVE Final    Comment: (NOTE) SARS-CoV-2 target nucleic acids are NOT DETECTED. The SARS-CoV-2 RNA is generally detectable in upper respiratoy specimens during the acute phase of infection. The lowest concentration of SARS-CoV-2 viral copies this assay can detect is 131 copies/mL. A negative result does not preclude SARS-Cov-2 infection and should not be used as the sole basis for treatment or other patient management decisions. A negative result may occur with  improper specimen collection/handling, submission of specimen other than nasopharyngeal swab, presence of viral mutation(s) within the areas targeted by this assay, and inadequate number of viral copies (<131 copies/mL). A negative result must be combined with clinical observations, patient history, and  epidemiological information. The expected result is Negative. Fact Sheet for Patients:  PinkCheek.be Fact Sheet for Healthcare Providers:  GravelBags.it This test is not yet ap proved or cleared by the [place] FDA and  has been authorized for detection and/or diagnosis of SARS-CoV-2 by FDA under an Emergency Use Authorization (EUA). This EUA will remain  in effect (meaning this test can be used) for the duration of the COVID-19 declaration under Section 564(b)(1) of the Act, 21 U.S.C. section 360bbb-3(b)(1), unless the authorization is terminated or revoked sooner.    Influenza A by PCR NEGATIVE NEGATIVE Final   Influenza B by PCR NEGATIVE NEGATIVE Final    Comment: (NOTE) The Xpert Xpress SARS-CoV-2/FLU/RSV assay is intended as an aid in  the diagnosis of influenza from Nasopharyngeal swab specimens and  should not be used as a sole basis for treatment. Nasal washings and  aspirates are unacceptable for Xpert Xpress SARS-CoV-2/FLU/RSV  testing. Fact Sheet for Patients: PinkCheek.be Fact Sheet for Healthcare Providers: GravelBags.it This test is not yet approved or cleared by the [place] FDA and  has been authorized for detection and/or diagnosis of SARS-CoV-2 by  FDA under an Emergency Use Authorization (EUA). This EUA will remain  in effect (meaning this test can be used) for the duration of the  Covid-19 declaration under Section 564(b)(1) of the Act, 21  U.S.C. section 360bbb-3(b)(1), unless the authorization is  terminated or revoked. Performed at [facility], [place] [address] [place], [place] [address]   Culture, blood (routine x 2) Call MD if unable to obtain prior to antibiotics being given     Status: None (Preliminary result)   Collection Time: 02/25/19  1:00 PM   Specimen: BLOOD  Result Value Ref Range Status   Specimen Description  BLOOD  LEFT ANTECUBITAL  Final   Special Requests   Final    BOTTLES DRAWN AEROBIC AND ANAEROBIC Blood Culture adequate volume   Culture   Final    NO GROWTH 4 DAYS Performed at [facility], [address], [place], [place] [address]    Report Status PENDING  Incomplete  Culture, blood (routine x 2) Call MD if unable to obtain prior to antibiotics being given     Status: None (Preliminary result)   Collection Time: 02/25/19  1:16 PM   Specimen: BLOOD  Result Value Ref Range Status   Specimen Description BLOOD BLOOD LEFT FOREARM  Final   Special Requests   Final    BOTTLES DRAWN AEROBIC AND ANAEROBIC Blood Culture results may not be optimal due to an inadequate volume of blood received in culture bottles   Culture   Final    NO GROWTH 4 DAYS Performed at [facility], [place] [address], [place], [place] [address]    Report Status PENDING  Incomplete  MRSA PCR Screening     Status: None   Collection Time: 02/26/19  2:08 AM   Specimen: Nasopharyngeal  Result Value Ref Range Status   MRSA by PCR NEGATIVE NEGATIVE Final    Comment:        The GeneXpert MRSA Assay (FDA approved for NASAL specimens only), is one component of a comprehensive MRSA colonization surveillance program. It is not intended to diagnose MRSA infection nor to guide or monitor treatment for MRSA infections. Performed at [facility], [place] [address] [place], [place] [address]       Radiology Studies: No results found.   Scheduled Meds: . Chlorhexidine Gluconate Cloth  6 each Topical Q0600  . Chlorhexidine Gluconate Cloth  6 each Topical Q0600  . heparin  5,000 Units Subcutaneous Q8H  . influenza vaccine adjuvanted  0.5 mL Intramuscular Tomorrow-1000  . lidocaine  1 patch Transdermal Q24H  . mouth rinse  15 mL Mouth Rinse BID  . multivitamin  1 tablet Oral Daily  . sodium chloride flush  3 mL Intravenous Q12H   Continuous Infusions: . sodium chloride    . piperacillin-tazobactam  (ZOSYN)  IV 2.25 g (03/01/19 1317)     LOS: 4 days    Time spent: 25 minutes spent in the coordination of care today.    Jonnie Finner, DO [facility] Pager [phone]  If 7PM-7AM, please contact night-coverage [email or web address] Password [email or web address] 03/01/2019, 3:53 PM

## 2019-03-01 NOTE — Consult Note (Signed)
Consultation Note Date: 03/01/2019   Patient Name: [name]  DOB: 1950/07/20  MRN: [id number]  Age / Sex: 68 y.o., female  PCP: Jenna Morning, DO Referring Physician: Jonnie Finner, DO  Reason for Consultation: Establishing goals of care  HPI/Patient Profile: 68 y.o. female  with past medical history of seizures, ESRD on HD TTS, HTN, Hep C, diabetes, h/o brain aneurysm, ischemic colitis s/p hemicolectomy and ileostomy, lumbar spinal stenosis admitted on 02/25/2019 with SOB and confusion. Was just seen in ED 12/8 and diagnosed with pneumonia but had requested to be discharged. She had missed 2 subsequent HD treatments prior to admission. Despite having dialysis while hospitalized she has continued with confusion and AMS.   Clinical Assessment and Goals of Care: I met today with [name]. She is alert and interactive and engaging and animated in conversation. She is aware she is in the hospital but does not know which hospital or what for. She is able to tell me that she lives with her granddaughter, [name], but then gets confused and tells me this is her daughter and could not tell me who her children vs grandchildren are. However, she is aware that she should know this information and is frustrated that she is confused and aware that she is confused.   I did call and speak with daughter, [name]. [name] shares with me that her mother has been doing very well up until she developed fever recently. She was at home but has had some difficulty ambulating which they attribute to arthritis and pain. However, she was also losing weight and poor intake but this was improving with mirtazapine prescribed recently by her PCP. I did specifically ask about memory and [name] does tell me that she does often get confused about which day is dialysis and who is taking her to dialysis. They have been instructed and  considering getting her tested for early signs of dementia. She has been on HD for ~30 years per daughter.   I also asked about any discussions regarding her wishes and [name] states that they have not really discussed in detail but were trying to get Living Will and get family onto her bank access in preparation. I informed [name] that I can discuss some of these decisions regarding her wishes for life support and introduction of HCPOA/Living Will and [name] would like to participate in these discussions as well and plans to come tomorrow afternoon to discuss further.   All questions/concerns addressed. Emotional support provided.   Primary Decision Maker NEXT OF KIN patient and daughters [name] and [name]    SUMMARY OF RECOMMENDATIONS   - Will meet with patient and family tomorrow to further discuss wishes and Advance Directive  Code Status/Advance Care Planning:  Full code   Symptom Management:   Poor appetite: Restart mirtazapine 15 mg qhs.   Pain: d/c morphine and added fentanyl 25 mcg IV every 4 hours prn. Would recommend use of home oxycodone since she is now more alert.   Palliative Prophylaxis:  Delirium Protocol, Frequent Pain Assessment, Oral Care and Turn Reposition  Psycho-social/Spiritual:   Desire for further Chaplaincy support:yes  Additional Recommendations: Caregiving  Support/Resources  Prognosis:   Overall prognosis concerning with overall functional decline (changes in ambulation, appetite). Will monitor progress over next few days.   Discharge Planning: To Be Determined      Primary Diagnoses: Present on Admission: . Pneumonia . Uremia . Hypertension   I have reviewed the medical record, interviewed the patient and family, and examined the patient. The following aspects are pertinent.  Past Medical History:  Diagnosis Date  . Anemia    anemia of chronic disease likely 2/2 ESRD per last anemia panel (02/2011) with Fe 35, TIBC 167, ferritin  2041  // BL Hgb 8-10  . Anuria    Due to dialysis  . Aortic stenosis, mild    03/2011 echo  . Arthritis    Back  . Blood transfusion 1990's   r/t Kidney removal surgery  . Brain aneurysm    No records could be found  . Colitis, ischemic ([place]) 01/2011   S/P partial colectomy of right hemicolon and ileostomy placement  . Diabetes mellitus    Borderline  . Dialysis patient [facility])    [place] Tues,Thursday, saturday  . Dysrhythmia    SVT for brief period in Feb 2013  . End stage renal disease on dialysis [facility])    Secondary to hypertension // T/Th/Sat dialysis on [employer]  . Foot drop, bilateral    after back surgery  . Heart murmur    Born with heart mumur, does not require follow up per pt  . Hepatitis C   . Hypertension    Does not see a heart doctor, had pre transplant stress test at [facility]    . Lumbar spinal stenosis     bilateral L4-5 and L5-S1 posterior lumbar interbody arthrodesis and bilateral L4-S1 posterior lateral arthrodesis for lumbar stenosis dynamic lumbar spondylolisthesis lumbar spondylosis lumbar radiculopathy with foot drop // s/p surgery 01/2011 - see surgery section for details.  . Seizures ([place])    r/t HTN in 1990's x 1  . Umbilical hernia age 32   Social History   Socioeconomic History  . Marital status: Legally Separated    Spouse name: Not on file  . Number of children: 2  . Years of education: 10th grade  . Highest education level: Not on file  Occupational History  . Occupation: Retired    Comment: previously worked as a Photographer  . Smoking status: Current Every Day Smoker    Packs/day: 0.50    Years: 48.00    Pack years: 24.00    Types: Cigarettes  . Smokeless tobacco: Never Used  . Tobacco comment: 6 cigarettes per day  Substance and Sexual Activity  . Alcohol use: Yes    Alcohol/week: 3.0 - 4.0 standard drinks    Types: 3 - 4 Shots of liquor per week    Comment: 3-4 shots of liquor on Fridays  . Drug use:  No  . Sexual activity: Never    Birth control/protection: None  Other Topics Concern  . Not on file  Social History Narrative   Lives in [place] with daughter. Has 2 On HD since for 20 years. Worked as a Sports coach. Went to 10th grade. Can read and write.    Social Determinants of Health   Financial Resource Strain:   . Difficulty of Paying Living Expenses: Not on file  Food Insecurity:   .  Worried About Charity fundraiser in the Last Year: Not on file  . Ran Out of Food in the Last Year: Not on file  Transportation Needs:   . Lack of Transportation (Medical): Not on file  . Lack of Transportation (Non-Medical): Not on file  Physical Activity:   . Days of Exercise per Week: Not on file  . Minutes of Exercise per Session: Not on file  Stress:   . Feeling of Stress : Not on file  Social Connections:   . Frequency of Communication with Friends and Family: Not on file  . Frequency of Social Gatherings with Friends and Family: Not on file  . Attends Religious Services: Not on file  . Active Member of Clubs or Organizations: Not on file  . Attends Archivist Meetings: Not on file  . Marital Status: Not on file   Family History  Problem Relation Age of Onset  . Diabetes Father   . Stroke Mother   . COPD Brother   . Heart attack Brother   . Bronchitis Sister   . Anesthesia problems Neg Hx   . Hypotension Neg Hx   . Malignant hyperthermia Neg Hx   . Pseudochol deficiency Neg Hx    Scheduled Meds: . Chlorhexidine Gluconate Cloth  6 each Topical Q0600  . Chlorhexidine Gluconate Cloth  6 each Topical Q0600  . heparin  5,000 Units Subcutaneous Q8H  . influenza vaccine adjuvanted  0.5 mL Intramuscular Tomorrow-1000  . lidocaine  1 patch Transdermal Q24H  . mouth rinse  15 mL Mouth Rinse BID  . multivitamin  1 tablet Oral Daily  . sodium chloride flush  3 mL Intravenous Q12H   Continuous Infusions: . sodium chloride    . piperacillin-tazobactam (ZOSYN)  IV 2.25 g  (03/01/19 0644)   PRN Meds:.sodium chloride, acetaminophen **OR** acetaminophen, albuterol, calcium carbonate (dosed in mg elemental calcium), docusate sodium, feeding supplement (NEPRO CARB STEADY), morphine injection, ondansetron **OR** ondansetron (ZOFRAN) IV, oxyCODONE-acetaminophen, sodium chloride flush, sorbitol Allergies  Allergen Reactions  . Iodinated Diagnostic Agents Other (See Comments)    Seziure Other reaction(s): Other (See Comments) Seziure   . Hydrocodone Nausea And Vomiting  . Metoprolol Nausea And Vomiting  . Iohexol Other (See Comments)    Reaction is convulsions   Review of Systems  Constitutional: Positive for activity change and appetite change.  Neurological: Positive for weakness.  Psychiatric/Behavioral: Positive for confusion.    Physical Exam Vitals and nursing note reviewed.  Constitutional:      General: She is not in acute distress.    Appearance: She is ill-appearing.     Comments: Temporal muscle wasting  Cardiovascular:     Rate and Rhythm: Normal rate.  Pulmonary:     Effort: Pulmonary effort is normal. No tachypnea, accessory muscle usage or respiratory distress.  Abdominal:     General: Abdomen is flat.  Neurological:     Mental Status: She is alert. She is confused.     Comments: Oriented to person and place     Vital Signs: BP 120/75 (BP Location: Left Arm)   Pulse 61   Temp 98.6 F (37 C) (Oral)   Resp 18   Ht _0  (1.651 m)   Wt 43.7 kg   SpO2 99%   BMI 16.03 kg/m  Pain Scale: 0-10 POSS *See Group Information*: S-Acceptable,Sleep, easy to arouse Pain Score: 4    SpO2: SpO2: 99 % O2 Device:SpO2: 99 % O2 Mccarthy Rate: .O2 Mccarthy Rate (L/min):  3 L/min  IO: Intake/output summary:   Intake/Output Summary (Last 24 hours) at 03/01/2019 1143 Last data filed at 02/28/2019 2330 Gross per 24 hour  Intake 50 ml  Output --  Net 50 ml    LBM: Last BM Date: (PTA) Baseline Weight: Weight: (UTA) Most recent weight: Weight:  43.7 kg     Palliative Assessment/Data: 40%     Time In/Out: 1100-1120, 1545-1620 Time Total: 55 min Greater than 50%  of this time was spent counseling and coordinating care related to the above assessment and plan.  Signed by: Vinie Sill, NP Palliative Medicine Team Pager # [phone] (M-F 8a-5p) Team Phone # [phone] (Nights/Weekends)

## 2019-03-02 LAB — GLUCOSE, CAPILLARY
Glucose-Capillary: 100 mg/dL — ABNORMAL HIGH (ref 70–99)
Glucose-Capillary: 101 mg/dL — ABNORMAL HIGH (ref 70–99)
Glucose-Capillary: 130 mg/dL — ABNORMAL HIGH (ref 70–99)
Glucose-Capillary: 157 mg/dL — ABNORMAL HIGH (ref 70–99)
Glucose-Capillary: 66 mg/dL — ABNORMAL LOW (ref 70–99)
Glucose-Capillary: 67 mg/dL — ABNORMAL LOW (ref 70–99)
Glucose-Capillary: 77 mg/dL (ref 70–99)
Glucose-Capillary: 79 mg/dL (ref 70–99)
Glucose-Capillary: 85 mg/dL (ref 70–99)
Glucose-Capillary: 92 mg/dL (ref 70–99)
Glucose-Capillary: 94 mg/dL (ref 70–99)

## 2019-03-02 LAB — CULTURE, BLOOD (ROUTINE X 2)
Culture: NO GROWTH
Culture: NO GROWTH
Special Requests: ADEQUATE

## 2019-03-02 LAB — RENAL FUNCTION PANEL
Albumin: 2.9 g/dL — ABNORMAL LOW (ref 3.5–5.0)
Anion gap: 21 — ABNORMAL HIGH (ref 5–15)
BUN: 47 mg/dL — ABNORMAL HIGH (ref 8–23)
CO2: 23 mmol/L (ref 22–32)
Calcium: 8.2 mg/dL — ABNORMAL LOW (ref 8.9–10.3)
Chloride: 97 mmol/L — ABNORMAL LOW (ref 98–111)
Creatinine, Ser: 8.79 mg/dL — ABNORMAL HIGH (ref 0.44–1.00)
GFR calc Af Amer: 5 mL/min — ABNORMAL LOW (ref >60)
GFR calc non Af Amer: 4 mL/min — ABNORMAL LOW (ref >60)
Glucose, Bld: 123 mg/dL — ABNORMAL HIGH (ref 70–99)
Phosphorus: 6.5 mg/dL — ABNORMAL HIGH (ref 2.5–4.6)
Potassium: 3.9 mmol/L (ref 3.5–5.1)
Sodium: 141 mmol/L (ref 135–145)

## 2019-03-02 LAB — CBC WITH DIFFERENTIAL/PLATELET
Abs Immature Granulocytes: 0.23 10*3/uL — ABNORMAL HIGH (ref 0.00–0.07)
Basophils Absolute: 0.1 10*3/uL (ref 0.0–0.1)
Basophils Relative: 1 %
Eosinophils Absolute: 0.5 10*3/uL (ref 0.0–0.5)
Eosinophils Relative: 5 %
HCT: 31 % — ABNORMAL LOW (ref 36.0–46.0)
Hemoglobin: 10.5 g/dL — ABNORMAL LOW (ref 12.0–15.0)
Immature Granulocytes: 2 %
Lymphocytes Relative: 18 %
Lymphs Abs: 2 10*3/uL (ref 0.7–4.0)
MCH: 27.3 pg (ref 26.0–34.0)
MCHC: 33.9 g/dL (ref 30.0–36.0)
MCV: 80.5 fL (ref 80.0–100.0)
Monocytes Absolute: 1 10*3/uL (ref 0.1–1.0)
Monocytes Relative: 9 %
Neutro Abs: 7.3 10*3/uL (ref 1.7–7.7)
Neutrophils Relative %: 65 %
Platelets: 415 10*3/uL — ABNORMAL HIGH (ref 150–400)
RBC: 3.85 MIL/uL — ABNORMAL LOW (ref 3.87–5.11)
RDW: 15.9 % — ABNORMAL HIGH (ref 11.5–15.5)
WBC: 11.2 10*3/uL — ABNORMAL HIGH (ref 4.0–10.5)
nRBC: 0 % (ref 0.0–0.2)

## 2019-03-02 LAB — TSH: TSH: 2.466 u[IU]/mL (ref 0.350–4.500)

## 2019-03-02 MED ORDER — CALCIUM CARBONATE ANTACID 500 MG PO CHEW
2.0000 | CHEWABLE_TABLET | Freq: Three times a day (TID) | ORAL | Status: DC
  Administered 2019-03-02 – 2019-03-04 (×7): 400 mg via ORAL
  Filled 2019-03-02 (×7): qty 2

## 2019-03-02 NOTE — Progress Notes (Signed)
[facility] Progress Note  Subjective:  Seen and examined on HD; procedure supervised.  RUE AVG in use. 106/62 and HR 103.  Goal decreased for cramping.  BF 400.   palliative care was consulted and plans for family meeting.  Review of systems:  Denies n/v No chest pain  Denies shortness of breah   Vitals:   03/02/19 0730 03/02/19 0739 03/02/19 0745 03/02/19 0800  BP: 99/68 125/66 134/76 (P) 131/71  Pulse: (!) 116 (!) 116 (!) 113 (!) (P) 115  Resp: 18     Temp: 98.6 F (37 C)     TempSrc: Oral     SpO2: 100%     Weight: 41.3 kg     Height:        Inpatient medications: . Chlorhexidine Gluconate Cloth  6 each Topical Q0600  . Chlorhexidine Gluconate Cloth  6 each Topical Q0600  . Chlorhexidine Gluconate Cloth  6 each Topical Q0600  . heparin  5,000 Units Subcutaneous Q8H  . influenza vaccine adjuvanted  0.5 mL Intramuscular Tomorrow-1000  . lidocaine  1 patch Transdermal Q24H  . mouth rinse  15 mL Mouth Rinse BID  . multivitamin  1 tablet Oral Daily  . sodium chloride flush  3 mL Intravenous Q12H   . sodium chloride    . piperacillin-tazobactam (ZOSYN)  IV 2.25 g (03/02/19 0647)   sodium chloride, acetaminophen **OR** acetaminophen, albuterol, calcium carbonate (dosed in mg elemental calcium), docusate sodium, feeding supplement (NEPRO CARB STEADY), fentaNYL (SUBLIMAZE) injection, ondansetron **OR** ondansetron (ZOFRAN) IV, oxyCODONE-acetaminophen, sodium chloride flush, sorbitol    Exam: General: frail elderly female in bed in NAD at rest Neck No JVD. Trachea midline  Lungs: Clear bilaterally to auscultation unlabored  Heart: S1S2 tachycardic; no rub; no edema Abdomen: Soft, non-tender, ND Musculoskeletal:  Reduced muscle mass on gross exam  Neuro: provides limited history Dialysis Access: RUE AVG + thrill and bruit    Dialysis: TTS at [facility] 3.5h  400/A1.5   44.5kg    2K/2.5Ca bath   P4    RUE AVG   Hep 1500 - Mircera 30 q 2 weeks (last 11/23) -  Hectoral 67mcg IV q HD  Assessment/Plan: 1.  AMS: CT head negative. Seen by neuro, EEG c/w severe encephalopathy, no evidence seizures. Improving per report  2. Pneumonia: leukocytosis and fevers down, abx per primary team. Note cefepime dc'd given delirium. COVID/Flu negative, blood cx's NGTD 3.  ESRD: HD TTS schedule 4.  Hypertension/volume: controlled  5.  Anemia secondary to ESRD: acceptable/at goal    6.  Metabolic bone disease: hyperphos - for HD today. Note reduced treatment time inpt. Start tums 7.  FTT - palliative care consulted for [place], patient declining for the last 6-12 mos according to OP HD staff.    Iron/TIBC/Ferritin/ %Sat    Component Value Date/Time   IRON 35 (L) 03/16/2011 1549   TIBC 167 (L) 03/16/2011 1549   FERRITIN 1,410 (H) 02/23/2019 1921   IRONPCTSAT 21 03/16/2011 1549   Recent Labs  Lab 02/23/19 2030 03/02/19 0224  NA  --  141  K  --  3.9  CL  --  97*  CO2  --  23  GLUCOSE  --  123*  BUN  --  47*  CREATININE  --  8.79*  CALCIUM  --  8.2*  PHOS  --  6.5*  ALBUMIN  --  2.9*  INR 1.3*  --    Recent Labs  Lab 02/27/19 0333  AST 13*  ALT 10  ALKPHOS 74  BILITOT 1.1  PROT 7.6   Recent Labs  Lab 03/02/19 0224  WBC 11.2*  HGB 10.5*  HCT 31.0*  PLT 415*     Claudia Desanctis 03/02/2019 8:29 AM

## 2019-03-02 NOTE — Progress Notes (Signed)
[name]  PROGRESS NOTE    [name]  OYD:[id number] DOB: 1950/12/03 DOA: 02/25/2019 PCP: Janie Morning, DO   Brief Narrative:   [name] a 68 y.o.femalewith medical history significant ofseizures(due to HTN x 1 in 1990s); HTN; hep C; ESRD on TTS HD; DM; ischemic colitis s/p hemicolectomy and ileostomy; and brain aneurysm presenting with SOB.She was seen on 12/8 [facility]neumonia at [facility]. Admission was planned but the patient eventually decided she did not want to wait for transport. She eventually requested outpatient evaluation and was treated with Levaquin. She developed worsening SOB and fatigue. She was mildly confused but was able to have a conversation (somewhat nonsensical) at the time of admission.. Throughout her ER stay while awaiting admission, she has become increasingly confused and less responsive but remained hemodynamically stable.  Assessment & Plan:   Principal Problem:   Uremia Active Problems:   End stage renal disease on dialysis [facility])   Hypertension   Pneumonia  Health care associated pneumonia with hypoxic respiratory failure     - SpO2 98% on RA     -Continue Zosyn through 03/03/19     - 02/25/19 Bld Cx NTD     - afebrile     -Persistently achy since she has been here.  Will check TSH.  ESRD on HD     - Missed hemodialysis sessions     -Getting scheduled HD here per neprho  Hyperkalemia     - resolved with HD  Encephalopathy     - Toxic metabolic/ infectious etiology     - Head CT negative      - EEG without any epileptiform discharges     - she is improved; currently alert and oriented x3.  Monitor.  Neurology signed off.  Dysphagia?     - SLP consulted  Chronic pain     - pain meds possibly contributed to encephalopathy??     - complains of b/l leg pain     - can start back meds at lower dose  DVT prophylaxis: heparin Code Status: FULL   Disposition Plan: TBD  Consultants:   Nephrology/neurology  Antimicrobials:   . zosyn   Subjective: Seen and examined in dialysis unit.  Complains of bilateral leg pain.  No other complaint.  Alert and oriented x3.  Objective: Vitals:   03/02/19 0930 03/02/19 1000 03/02/19 1030 03/02/19 1100  BP: (!) 90/53 (!) 92/51 (!) 99/57 (!) 102/35  Pulse: (!) 119 (!) 111 (!) 120 (!) 116  Resp:      Temp:      TempSrc:      SpO2:      Weight:      Height:        Intake/Output Summary (Last 24 hours) at 03/02/2019 1224 Last data filed at 03/02/2019 0330 Gross per 24 hour  Intake 410 ml  Output --  Net 410 ml   Filed Weights   02/27/19 1849 02/27/19 2241 03/02/19 0730  Weight: 44.9 kg 43.7 kg 41.3 kg    Examination:  General exam: Appears calm and comfortable  Respiratory system: Clear to auscultation. Respiratory effort normal. Cardiovascular system: S1 & S2 heard, tachycardia. No JVD, murmurs, rubs, gallops or clicks. No pedal edema. Gastrointestinal system: Abdomen is nondistended, soft and nontender. No organomegaly or masses felt. Normal bowel sounds heard. Central nervous system: Alert and oriented x3. No focal neurological deficits. Extremities: Symmetric 5 x 5 power. Skin: No rashes, lesions or ulcers.  Psychiatry: Judgement and insight appear poor.  Mood & affect flat.  Data Reviewed: I have personally reviewed following labs and imaging studies.  CBC: Recent Labs  Lab 02/23/19 1921 02/25/19 0807 02/26/19 0318 02/27/19 0333 03/01/19 0839 03/02/19 0224  WBC 15.6* 19.0* 13.7* 8.4 13.4* 11.2*  NEUTROABS 14.0* 15.4* 10.6*  --  9.4* 7.3  HGB 9.7* 10.1* 9.9* 10.3* 10.4* 10.5*  HCT 29.4* 30.6* 28.6* 30.1* 31.3* 31.0*  MCV 83.8 82.3 79.4* 80.7 81.5 80.5  PLT 295 359 398 425* 437* 169*   Basic Metabolic Panel: Recent Labs  Lab 02/25/19 0807 02/26/19 0318 02/27/19 0333 03/01/19 0839 03/02/19 0224  NA 136 136 135 144 141  K 5.8* 4.5 4.5 4.4 3.9  CL 93* 95* 92* 99 97*  CO2 20* 24 25 24 23   GLUCOSE 97 75 101* 93 123*  BUN 79* 24* 32* 40*  47*  CREATININE 11.91* 5.69* 7.63* 7.55* 8.79*  CALCIUM 8.0* 8.7* 9.0 8.4* 8.2*  MG 2.3  --  2.1  --   --   PHOS 4.3  --  4.6 6.8* 6.5*   GFR: Estimated Creatinine Clearance: 4 mL/min (A) (by C-G formula based on SCr of 8.79 mg/dL (H)). Liver Function Tests: Recent Labs  Lab 02/23/19 1921 02/25/19 0807 02/27/19 0333 03/01/19 0839 03/02/19 0224  AST 22 25 13*  --   --   ALT 12 15 10   --   --   ALKPHOS 85 95 74  --   --   BILITOT 1.2 1.5* 1.1  --   --   PROT 7.7 8.1 7.6  --   --   ALBUMIN 3.4* 3.1* 2.8* 2.9* 2.9*   No results for input(s): LIPASE, AMYLASE in the last 168 hours. Recent Labs  Lab 02/26/19 1056  AMMONIA 13   Coagulation Profile: Recent Labs  Lab 02/23/19 2030  INR 1.3*   Cardiac Enzymes: No results for input(s): CKTOTAL, CKMB, CKMBINDEX, TROPONINI in the last 168 hours. BNP (last 3 results) No results for input(s): PROBNP in the last 8760 hours. HbA1C: No results for input(s): HGBA1C in the last 72 hours. CBG: Recent Labs  Lab 03/01/19 2202 03/01/19 2353 03/02/19 0206 03/02/19 0335 03/02/19 0640  GLUCAP 81 71 94 100* 79   Lipid Profile: No results for input(s): CHOL, HDL, LDLCALC, TRIG, CHOLHDL, LDLDIRECT in the last 72 hours. Thyroid Function Tests: No results for input(s): TSH, T4TOTAL, FREET4, T3FREE, THYROIDAB in the last 72 hours. Anemia Panel: No results for input(s): VITAMINB12, FOLATE, FERRITIN, TIBC, IRON, RETICCTPCT in the last 72 hours. Sepsis Labs: Recent Labs  Lab 02/23/19 1921 02/23/19 2040 02/23/19 2340  PROCALCITON  --  51.53  --   LATICACIDVEN 2.1*  --  2.1*    Recent Results (from the past 240 hour(s))  Novel Coronavirus, NAA ([facility])     Status: None   Collection Time: 02/23/19 12:00 AM   Specimen: Nasopharyngeal(NP) swabs in vial transport medium   NASOPHARYNGE  TESTING  Result Value Ref Range Status   SARS-CoV-2, NAA Not Detected Not Detected Final    Comment: This nucleic acid amplification test was developed  and its performance characteristics determined by [employer]. Nucleic acid amplification tests include PCR and TMA. This test has not been FDA cleared or approved. This test has been authorized by FDA under an Emergency Use Authorization (EUA). This test is only authorized for the duration of time the declaration that circumstances exist justifying the authorization of the emergency use of in vitro diagnostic tests for detection of SARS-CoV-2 virus and/or  diagnosis of COVID-19 infection under section 564(b)(1) of the Act, 21 U.S.C. 224MGN-0(I) (1), unless the authorization is terminated or revoked sooner. When diagnostic testing is negative, the possibility of a false negative result should be considered in the context of a patient's recent exposures and the presence of clinical signs and symptoms consistent with COVID-19. An individual without symptoms of COVID-19 and who is not shedding SARS-CoV-2 virus would  expect to have a negative (not detected) result in this assay.   SARS Coronavirus 2 Ag (30 min TAT) - Nasal Swab (BD Veritor Kit)     Status: None   Collection Time: 02/23/19  7:21 PM   Specimen: Nasal Swab (BD Veritor Kit)  Result Value Ref Range Status   SARS Coronavirus 2 Ag NEGATIVE NEGATIVE Final    Comment: (NOTE) SARS-CoV-2 antigen NOT DETECTED.  Negative results are presumptive.  Negative results do not preclude SARS-CoV-2 infection and should not be used as the sole basis for treatment or other patient management decisions, including infection  control decisions, particularly in the presence of clinical signs and  symptoms consistent with COVID-19, or in those who have been in contact with the virus.  Negative results must be combined with clinical observations, patient history, and epidemiological information. The expected result is Negative. Fact Sheet for Patients: PodPark.tn Fact Sheet for Healthcare  Providers: GiftContent.is This test is not yet approved or cleared by the [place] FDA and  has been authorized for detection and/or diagnosis of SARS-CoV-2 by FDA under an Emergency Use Authorization (EUA).  This EUA will remain in effect (meaning this test can be used) for the duration of  the COVID-19 de claration under Section 564(b)(1) of the Act, 21 U.S.C. section 360bbb-3(b)(1), unless the authorization is terminated or revoked sooner. Performed at [facility], [address] [place], [place] [address]   Culture, blood (Routine X 2) w Reflex to ID Panel     Status: None   Collection Time: 02/23/19  7:25 PM   Specimen: BLOOD  Result Value Ref Range Status   Specimen Description   Final    BLOOD BLOOD LEFT WRIST Performed at [facility], [address] [place], [place] [address]    Special Requests   Final    BOTTLES DRAWN AEROBIC AND ANAEROBIC Blood Culture adequate volume Performed at [facility], [address] [place], [place] [address]    Culture   Final    NO GROWTH 5 DAYS Performed at [facility], [place] [address], [place], [place] [address]    Report Status 03/01/2019 FINAL  Final  Culture, blood (Routine X 2) w Reflex to ID Panel     Status: None   Collection Time: 02/23/19  7:30 PM   Specimen: BLOOD  Result Value Ref Range Status   Specimen Description   Final    BLOOD LEFT ANTECUBITAL Performed at [facility], [address] [place], [place] [address]    Special Requests   Final    BOTTLES DRAWN AEROBIC AND ANAEROBIC Blood Culture adequate volume Performed at [facility], [address], [place], [place] [address]    Culture   Final    NO GROWTH 5 DAYS Performed at [facility], [place] [address] [place], [place] [address]    Report Status 03/01/2019 FINAL  Final  SARS CORONAVIRUS 2 (TAT 6-24 HRS) Nasopharyngeal Nasopharyngeal Swab     Status: None  Collection Time: 02/23/19 10:06 PM   Specimen: Nasopharyngeal Swab  Result Value Ref Range Status   SARS Coronavirus 2 NEGATIVE NEGATIVE Final    Comment: (NOTE) SARS-CoV-2 target nucleic acids are NOT DETECTED. The SARS-CoV-2 RNA is generally detectable in upper and lower respiratory specimens during the acute phase of infection. Negative results do not preclude SARS-CoV-2 infection, do not rule out co-infections with other pathogens, and should not be used as the sole basis for treatment or other patient management decisions. Negative results must be combined with clinical observations, patient history, and epidemiological information. The expected result is Negative. Fact Sheet for Patients: SugarRoll.be Fact Sheet for Healthcare Providers: https://www.woods-mathews.com/ This test is not yet approved or cleared by the [place] FDA and  has been authorized for detection and/or diagnosis of SARS-CoV-2 by FDA under an Emergency Use Authorization (EUA). This EUA will remain  in effect (meaning this test can be used) for the duration of the COVID-19 declaration under Section 56 4(b)(1) of the Act, 21 U.S.C. section 360bbb-3(b)(1), unless the authorization is terminated or revoked sooner. Performed at [facility], [place] [address] [place], [place] [address]   Respiratory Panel by RT PCR (Flu A&B, Covid) - Nasopharyngeal Swab     Status: None   Collection Time: 02/25/19 11:01 AM   Specimen: Nasopharyngeal Swab  Result Value Ref Range Status   SARS Coronavirus 2 by RT PCR NEGATIVE NEGATIVE Final    Comment: (NOTE) SARS-CoV-2 target nucleic acids are NOT DETECTED. The SARS-CoV-2 RNA is generally detectable in upper respiratoy specimens during the acute phase of infection. The lowest concentration of SARS-CoV-2 viral copies this assay can detect is 131 copies/mL. A negative result does not preclude SARS-Cov-2 infection and should not  be used as the sole basis for treatment or other patient management decisions. A negative result may occur with  improper specimen collection/handling, submission of specimen other than nasopharyngeal swab, presence of viral mutation(s) within the areas targeted by this assay, and inadequate number of viral copies (<131 copies/mL). A negative result must be combined with clinical observations, patient history, and epidemiological information. The expected result is Negative. Fact Sheet for Patients:  PinkCheek.be Fact Sheet for Healthcare Providers:  GravelBags.it This test is not yet ap proved or cleared by the [place] FDA and  has been authorized for detection and/or diagnosis of SARS-CoV-2 by FDA under an Emergency Use Authorization (EUA). This EUA will remain  in effect (meaning this test can be used) for the duration of the COVID-19 declaration under Section 564(b)(1) of the Act, 21 U.S.C. section 360bbb-3(b)(1), unless the authorization is terminated or revoked sooner.    Influenza A by PCR NEGATIVE NEGATIVE Final   Influenza B by PCR NEGATIVE NEGATIVE Final    Comment: (NOTE) The Xpert Xpress SARS-CoV-2/FLU/RSV assay is intended as an aid in  the diagnosis of influenza from Nasopharyngeal swab specimens and  should not be used as a sole basis for treatment. Nasal washings and  aspirates are unacceptable for Xpert Xpress SARS-CoV-2/FLU/RSV  testing. Fact Sheet for Patients: PinkCheek.be Fact Sheet for Healthcare Providers: GravelBags.it This test is not yet approved or cleared by the [place] FDA and  has been authorized for detection and/or diagnosis of SARS-CoV-2 by  FDA under an Emergency Use Authorization (EUA). This EUA will remain  in effect (meaning this test can be used) for the duration of the  Covid-19 declaration under Section 564(b)(1) of  the Act, 21  U.S.C. section 360bbb-3(b)(1), unless the  authorization is  terminated or revoked. Performed at [facility], [place] [address] [place], [place] [address]   Culture, blood (routine x 2) Call MD if unable to obtain prior to antibiotics being given     Status: None (Preliminary result)   Collection Time: 02/25/19  1:00 PM   Specimen: BLOOD  Result Value Ref Range Status   Specimen Description BLOOD LEFT ANTECUBITAL  Final   Special Requests   Final    BOTTLES DRAWN AEROBIC AND ANAEROBIC Blood Culture adequate volume   Culture   Final    NO GROWTH 4 DAYS Performed at [facility], [place] [address], [place], [place] [address]    Report Status PENDING  Incomplete  Culture, blood (routine x 2) Call MD if unable to obtain prior to antibiotics being given     Status: None (Preliminary result)   Collection Time: 02/25/19  1:16 PM   Specimen: BLOOD  Result Value Ref Range Status   Specimen Description BLOOD BLOOD LEFT FOREARM  Final   Special Requests   Final    BOTTLES DRAWN AEROBIC AND ANAEROBIC Blood Culture results may not be optimal due to an inadequate volume of blood received in culture bottles   Culture   Final    NO GROWTH 4 DAYS Performed at [facility], [place] [address] [place], [place] [address]    Report Status PENDING  Incomplete  MRSA PCR Screening     Status: None   Collection Time: 02/26/19  2:08 AM   Specimen: Nasopharyngeal  Result Value Ref Range Status   MRSA by PCR NEGATIVE NEGATIVE Final    Comment:        The GeneXpert MRSA Assay (FDA approved for NASAL specimens only), is one component of a comprehensive MRSA colonization surveillance program. It is not intended to diagnose MRSA infection nor to guide or monitor treatment for MRSA infections. Performed at [facility], [place] [address], [place], [place] [address]       Radiology Studies: No results found.   Scheduled Meds: . calcium carbonate  2 tablet Oral TID WC   . Chlorhexidine Gluconate Cloth  6 each Topical Q0600  . Chlorhexidine Gluconate Cloth  6 each Topical Q0600  . Chlorhexidine Gluconate Cloth  6 each Topical Q0600  . heparin  5,000 Units Subcutaneous Q8H  . influenza vaccine adjuvanted  0.5 mL Intramuscular Tomorrow-1000  . lidocaine  1 patch Transdermal Q24H  . mouth rinse  15 mL Mouth Rinse BID  . multivitamin  1 tablet Oral Daily  . sodium chloride flush  3 mL Intravenous Q12H   Continuous Infusions: . sodium chloride    . piperacillin-tazobactam (ZOSYN)  IV 2.25 g (03/02/19 0647)     LOS: 5 days    Time spent: 30 minutes   Darliss Cheney, MD [facility]  If 7PM-7AM, please contact night-coverage [email or web address] Password [facility] 03/02/2019, 12:24 PM

## 2019-03-02 NOTE — Progress Notes (Signed)
Pt. C/o stomach cramping bp 72/41 uf turned off NS bolus 110ml given. Dr. Royce Macadamia made aware orders to decrease uf goal to 1L as tolerated. Pt. stable

## 2019-03-02 NOTE — Evaluation (Signed)
Clinical/Bedside Swallow Evaluation Patient Details  Name: [name] MRN: [id number] Date of Birth: September 19, 1950  Today's Date: 03/02/2019 Time: SLP Start Time (ACUTE ONLY): 65 SLP Stop Time (ACUTE ONLY): 1613 SLP Time Calculation (min) (ACUTE ONLY): 12 min  Past Medical History:  Past Medical History:  Diagnosis Date  . Anemia    anemia of chronic disease likely 2/2 ESRD per last anemia panel (02/2011) with Fe 35, TIBC 167, ferritin 2041  // BL Hgb 8-10  . Anuria    Due to dialysis  . Aortic stenosis, mild    03/2011 echo  . Arthritis    Back  . Blood transfusion 1990's   r/t Kidney removal surgery  . Brain aneurysm    No records could be found  . Colitis, ischemic ([place]) 01/2011   S/P partial colectomy of right hemicolon and ileostomy placement  . Diabetes mellitus    Borderline  . Dialysis patient [facility])    [place] Tues,Thursday, saturday  . Dysrhythmia    SVT for brief period in Feb 2013  . End stage renal disease on dialysis [facility])    Secondary to hypertension // T/Th/Sat dialysis on [employer]  . Foot drop, bilateral    after back surgery  . Heart murmur    Born with heart mumur, does not require follow up per pt  . Hepatitis C   . Hypertension    Does not see a heart doctor, had pre transplant stress test at [facility]    . Lumbar spinal stenosis     bilateral L4-5 and L5-S1 posterior lumbar interbody arthrodesis and bilateral L4-S1 posterior lateral arthrodesis for lumbar stenosis dynamic lumbar spondylolisthesis lumbar spondylosis lumbar radiculopathy with foot drop // s/p surgery 01/2011 - see surgery section for details.  . Seizures ([place])    r/t HTN in 1990's x 1  . Umbilical hernia age 70   Past Surgical History:  Past Surgical History:  Procedure Laterality Date  . A/V FISTULAGRAM Right 02/05/2019   Procedure: A/V FISTULAGRAM;  Surgeon: Katha Cabal, MD;  Location: [place] CV LAB;  Service: Cardiovascular;  Laterality: Right;  .  APPENDECTOMY  1960's  . AV FISTULA PLACEMENT  04/08/2011   Procedure: INSERTION OF ARTERIOVENOUS (AV) GORE-TEX GRAFT ARM;  Surgeon: Rosetta Posner, MD;  Location: [facility] OR;  Service: Vascular;  Laterality: Right;  Revision of Upper Arm Gore-Tex Graft  . BACK SURGERY  01/30/2011   L4-S1 lumbar laminectomy facetectomy and foraminotomies for decompression a bilateral L4-5 and L5-S1 posterior lumbar interbody arthrodesis and bilateral L4-S1 posterior lateral arthrodesis for lumbar stenosis dynamic lumbar spondylolisthesis lumbar spondylosis lumbar radiculopathy with foot drop  . COLONOSCOPY  02/19/2011   Procedure: COLONOSCOPY;  Surgeon: Missy Sabins, MD;  Location: [place];  Service: Endoscopy;  Laterality: N/A;  . COLONOSCOPY  03/07/2011   Procedure: COLONOSCOPY;  Surgeon: Cleotis Nipper, MD;  Location: [facility] ENDOSCOPY;  Service: Endoscopy;  Laterality: N/A;  no prep needed--pt has long Hartmann's pouch and ileostomy  . COLOSTOMY  02/09/2011   Reason for surgery: ischemic colitis of right hemicolon requiring partial colectomy and ileostomy.  Surgeon: Harl Bowie, MD;  Location: [place];  Service: General;  Laterality: Right;  . ILEOSTOMY CLOSURE  05/29/2011   Procedure: ILEOSTOMY TAKEDOWN;  Surgeon: Harl Bowie, MD;  Location: [place];  Service: General;  Laterality: N/A;  . INSERTION OF DIALYSIS CATHETER Right 04/15/2016   Procedure: INSERTION OF DIALYSIS CATHETER RIGHT INTERNAL JUGULAR;  Surgeon: Conrad Notus,  MD;  Location: [place];  Service: Vascular;  Laterality: Right;  . LAPAROTOMY  02/09/2011   Procedure: EXPLORATORY LAPAROTOMY;  Surgeon: Harl Bowie, MD;  Location: [place];  Service: General;  Laterality: N/A;  . NEPHRECTOMY  2010   right side done at [facility] on transplant list  . PARTIAL COLECTOMY  02/09/2011   Reason for surgery: ischemic colitis of right hemicolon; Surgeon: Harl Bowie, MD;  Location: [place];  Service: General;  Laterality: Right;  . REVISION OF  ARTERIOVENOUS GORETEX GRAFT Right 04/15/2016   Procedure: REVISION OF ARTERIOVENOUS GORETEX GRAFT USING 74mmX20cm GORETEX GRAFT;  Surgeon: Conrad Keyes, MD;  Location: [place];  Service: Vascular;  Laterality: Right;  . RIB RESECTION     d/t kidney removal  . TONSILLECTOMY     as a child  . total parathyroidectomy  01/17/2001   with autotransplantation into left forearm // due to secondary hyperparathyroidism from ESRD  . UMBILICAL HERNIA REPAIR  age 72- 89  . UPPER EXTREMITY ANGIOGRAPHY Right 07/21/2017   Procedure: UPPER EXTREMITY ANGIOGRAPHY;  Surgeon: Waynetta Sandy, MD;  Location: [place] [facility] LAB;  Service: Cardiovascular;  Laterality: Right;  [facility] VASCULAR SURGERY     right arm dialysis graft   HPI:  [name] a 68 y.o.femalewith medical history significant ofseizures, HTN; hep C; ESRD on TTS HD; DM; ischemic colitis s/p hemicolectomy and ileostomy; and brain aneurysm presenting with SOB.Per chart pt  was seen on 12/8 forpneumonia at [facility].    Assessment / Plan / Recommendation Clinical Impression  Pt alert, cooperative; missing some dentition and fair-poor condition. Pharyngeal swallow is audibly loud and sluggish and suspect discoordination. Immediate soft cough and suspect she may be penetrating or aspirating. Aware of Palliative care consult. ST modifying diet to DYs 2, nectar thick liquids, recommend pills whole in puree and plan for MBS tomorrow to fully assess.  SLP Visit Diagnosis: Dysphagia, unspecified (R13.10)    Aspiration Risk  Mild aspiration risk;Moderate aspiration risk    Diet Recommendation Dysphagia 2 (Fine chop);Nectar-thick liquid   Liquid Administration via: Cup;Straw Medication Administration: Whole meds with puree Supervision: Patient able to self feed;Full supervision/cueing for compensatory strategies Compensations: Slow rate;Small sips/bites Postural Changes: Seated upright at 90 degrees    Other  Recommendations Oral Care  Recommendations: Oral care BID Other Recommendations: Order thickener from pharmacy   Follow up Recommendations 24 hour supervision/assistance      Frequency and Duration min 2x/week  2 weeks       Prognosis Prognosis for Safe Diet Advancement: Fair Barriers to Reach Goals: Cognitive deficits      Swallow Study   General HPI: PIERRE WEINSTOCK a 68 y.o.femalewith medical history significant ofseizures, HTN; hep C; ESRD on TTS HD; DM; ischemic colitis s/p hemicolectomy and ileostomy; and brain aneurysm presenting with SOB.Per chart pt  was [facility] on 12[facility]8 [facility] at [facility].  Type of Study: Bedside Swallow Evaluation Previous Swallow Assessment: (none) Diet Prior to this Study: Thin liquids(clears) Temperature Spikes Noted: No Respiratory Status: Room air History of Recent Intubation: No Behavior/Cognition: Alert;Cooperative;Pleasant mood;Requires cueing Oral Cavity Assessment: Within Functional Limits Oral Care Completed by SLP: No Oral Cavity - Dentition: Poor condition;Missing dentition Vision: Functional for self-feeding Self-Feeding Abilities: Able to feed self;Needs assist Patient Positioning: Upright in bed Baseline Vocal Quality: Normal Volitional Cough: Strong Volitional Swallow: Able to elicit    Oral/Motor/Sensory Function Overall Oral Motor/Sensory Function: Within functional limits   Ice Chips Ice chips: Not tested  Thin Liquid Thin Liquid: Impaired Presentation: Cup;Straw Pharyngeal  Phase Impairments: Suspected delayed Swallow;Cough - Immediate(audible, sluggish)    Nectar Thick Nectar Thick Liquid: Not tested   Honey Thick Honey Thick Liquid: Not tested   Puree Puree: Impaired Pharyngeal Phase Impairments: (audible. s;uggish)   Solid     Solid: Impaired Oral Phase Functional Implications: Oral residue      Houston Siren 03/02/2019,4:25 PM  Orbie Pyo Colvin Caroli.Ed Risk analyst [phone] Office  [phone]

## 2019-03-03 ENCOUNTER — Inpatient Hospital Stay (HOSPITAL_COMMUNITY): Payer: Medicare Other

## 2019-03-03 LAB — RENAL FUNCTION PANEL
Albumin: 2.7 g/dL — ABNORMAL LOW (ref 3.5–5.0)
Anion gap: 18 — ABNORMAL HIGH (ref 5–15)
BUN: 21 mg/dL (ref 8–23)
CO2: 26 mmol/L (ref 22–32)
Calcium: 8.4 mg/dL — ABNORMAL LOW (ref 8.9–10.3)
Chloride: 93 mmol/L — ABNORMAL LOW (ref 98–111)
Creatinine, Ser: 4.75 mg/dL — ABNORMAL HIGH (ref 0.44–1.00)
GFR calc Af Amer: 10 mL/min — ABNORMAL LOW (ref >60)
GFR calc non Af Amer: 9 mL/min — ABNORMAL LOW (ref >60)
Glucose, Bld: 94 mg/dL (ref 70–99)
Phosphorus: 5.3 mg/dL — ABNORMAL HIGH (ref 2.5–4.6)
Potassium: 3.8 mmol/L (ref 3.5–5.1)
Sodium: 137 mmol/L (ref 135–145)

## 2019-03-03 LAB — GLUCOSE, CAPILLARY
Glucose-Capillary: 94 mg/dL (ref 70–99)
Glucose-Capillary: 83 mg/dL (ref 70–99)
Glucose-Capillary: 123 mg/dL — ABNORMAL HIGH (ref 70–99)
Glucose-Capillary: 103 mg/dL — ABNORMAL HIGH (ref 70–99)
Glucose-Capillary: 89 mg/dL (ref 70–99)
Glucose-Capillary: 117 mg/dL — ABNORMAL HIGH (ref 70–99)
Glucose-Capillary: 92 mg/dL (ref 70–99)
Glucose-Capillary: 114 mg/dL — ABNORMAL HIGH (ref 70–99)
Glucose-Capillary: 154 mg/dL — ABNORMAL HIGH (ref 70–99)

## 2019-03-03 MED ORDER — CHLORHEXIDINE GLUCONATE CLOTH 2 % EX PADS
6.0000 | MEDICATED_PAD | Freq: Every day | CUTANEOUS | Status: DC
  Administered 2019-03-04: 6 via TOPICAL

## 2019-03-03 MED ORDER — RESOURCE THICKENUP CLEAR PO POWD
ORAL | Status: DC | PRN
  Filled 2019-03-03: qty 125

## 2019-03-03 NOTE — Progress Notes (Addendum)
Patient ID: [name], female   DOB: 1951-03-13, 68 y.o.   MRN: [id number]  PROGRESS NOTE    [name]  EVO:[id number] DOB: Feb 22, 1951 DOA: 02/25/2019 PCP: Janie Morning, DO   Brief Narrative:  68 year old female with history of seizure, hypertension, hepatitis C, end-stage renal disease on hemodialysis, diabetes mellitus type 2, ischemic colitis status post hemicolectomy and ileostomy, brain aneurysm presented with shortness of breath on 02/25/2019.  On presentation, she was getting increasingly confused as well.  She was admitted for respiratory failure secondary to pneumonia and started on antibiotics.  Neurology was consulted for encephalopathy.  Head CT was negative.  EEG was negative for seizures.  Neurology signed off.  Assessment & Plan:   Healthcare associated pneumonia with concern for gram-negative pneumonia versus MRSA pneumonia Acute hypoxic respiratory failure -Currently on room air -Cultures have been negative so far.  On presentation, COVID-19 test was negative -Initially started on cefepime but was switched to IV Zosyn.  DC Zosyn after today's dose.  Today is day #7 of antibiotics -Diet as per SLP recommendations  End-stage renal disease on hemodialysis -Nephrology following.  Dialysis as per nephrology schedule.  Hyperkalemia -Resolved  Acute metabolic encephalopathy -CT of the head was negative for acute abnormality.  Might be related to pain medications as well -EEG without any epileptiform discharges -Mental status is much improved.  Neurology has signed off.  Monitor mental status  Dysphagia -SLP following  Chronic pain -Pain meds possibly contributed to encephalopathy? -Continue lidocaine patch.  Outpatient follow-up.  Might need to restart pain meds at a lower dose  Generalized deconditioning -PT eval.  Palliative care following for goals of care discussion.    DVT prophylaxis: Heparin Code Status: Full Family Communication: Spoke to  daughter/[name] on phone on 03/03/2019 Disposition Plan: Home in 1 to 2 days if remains clinically stable  Consultants: Nephrology/neurology/palliative care  Procedures:  EEG This study is suggestive of severe diffuse encephalopathy, nonspecific etiology but could be seen in setting of toxic metabolic causes, cefepime toxicity. No seizures or definite epileptiform discharges were seen throughout the recording.  Antimicrobials:  Anti-infectives (From admission, onward)   Start     Dose/Rate Route Frequency Ordered Stop   02/27/19 1400  piperacillin-tazobactam (ZOSYN) IVPB 2.25 g     2.25 g 100 mL/hr over 30 Minutes Intravenous Every 8 hours 02/27/19 1235     02/27/19 1200  vancomycin (VANCOCIN) IVPB 500 mg/100 ml premix  Status:  Discontinued     500 mg 100 mL/hr over 60 Minutes Intravenous Every T-Th-Sa (Hemodialysis) 02/26/19 1102 02/27/19 1235   02/26/19 1200  ceFEPIme (MAXIPIME) 1 g in sodium chloride 0.9 % 100 mL IVPB  Status:  Discontinued     1 g 200 mL/hr over 30 Minutes Intravenous Every 24 hours 02/25/19 1127 02/27/19 1140   02/26/19 0000  vancomycin variable dose per unstable renal function (pharmacist dosing)  Status:  Discontinued      Does not apply See admin instructions 02/25/19 1141 02/26/19 1102   02/25/19 1130  ceFEPIme (MAXIPIME) 2 g in sodium chloride 0.9 % 100 mL IVPB     2 g 200 mL/hr over 30 Minutes Intravenous  Once 02/25/19 1126 02/25/19 1225   02/25/19 1130  vancomycin (VANCOCIN) 1,250 mg in sodium chloride 0.9 % 250 mL IVPB     1,250 mg 166.7 mL/hr over 90 Minutes Intravenous  Once 02/25/19 1127 02/25/19 1421       Subjective: Patient seen and examined at bedside.  She  is awake, slow to respond to questions.  Poor historian.  Denies any overnight fever, nausea or vomiting.  Objective: Vitals:   03/02/19 1109 03/02/19 1725 03/02/19 2254 03/03/19 0743  BP: 122/73 125/70 126/72 101/64  Mccarthy: (!) 116 (!) 115 (!) 117 (!) 109  Resp: _0 Temp:  (!) 97.4 F (36.3 C)  98.1 F (36.7 C) 98.8 F (37.1 C)  TempSrc: Oral   Oral  SpO2: 98% 99% 98% 100%  Weight: 41.8 kg     Height:        Intake/Output Summary (Last 24 hours) at 03/03/2019 1259 Last data filed at 03/03/2019 0018 Gross per 24 hour  Intake 50 ml  Output --  Net 50 ml   Filed Weights   02/27/19 2241 03/02/19 0730 03/02/19 1109  Weight: 43.7 kg 41.3 kg 41.8 kg    Examination:  General exam: Appears calm and comfortable.  Looks older than stated age.  Awake, slow to respond.  Poor historian Respiratory system: Bilateral decreased breath sounds at bases with some scattered crackles Cardiovascular system: S1 & S2 heard, tachycardic Gastrointestinal system: Abdomen is nondistended, soft and nontender. Normal bowel sounds heard. Extremities: No cyanosis, clubbing; trace edema  Data Reviewed: I have personally reviewed following labs and imaging studies  CBC: Recent Labs  Lab 02/25/19 0807 02/26/19 0318 02/27/19 0333 03/01/19 0839 03/02/19 0224  WBC 19.0* 13.7* 8.4 13.4* 11.2*  NEUTROABS 15.4* 10.6*  --  9.4* 7.3  HGB 10.1* 9.9* 10.3* 10.4* 10.5*  HCT 30.6* 28.6* 30.1* 31.3* 31.0*  MCV 82.3 79.4* 80.7 81.5 80.5  PLT 359 398 425* 437* 354*   Basic Metabolic Panel: Recent Labs  Lab 02/25/19 0807 02/26/19 0318 02/27/19 0333 03/01/19 0839 03/02/19 0224 03/03/19 0242  NA 136 136 135 144 141 137  K 5.8* 4.5 4.5 4.4 3.9 3.8  CL 93* 95* 92* 99 97* 93*  CO2 20* _1 GLUCOSE 97 75 101* 93 123* 94  BUN 79* 24* 32* 40* 47* 21  CREATININE 11.91* 5.69* 7.63* 7.55* 8.79* 4.75*  CALCIUM 8.0* 8.7* 9.0 8.4* 8.2* 8.4*  MG 2.3  --  2.1  --   --   --   PHOS 4.3  --  4.6 6.8* 6.5* 5.3*   GFR: Estimated Creatinine Clearance: 7.5 mL/min (A) (by C-G formula based on SCr of 4.75 mg/dL (H)). Liver Function Tests: Recent Labs  Lab 02/25/19 0807 02/27/19 0333 03/01/19 0839 03/02/19 0224 03/03/19 0242  AST 25 13*  --   --   --   ALT 15 10  --   --    --   ALKPHOS 95 74  --   --   --   BILITOT 1.5* 1.1  --   --   --   PROT 8.1 7.6  --   --   --   ALBUMIN 3.1* 2.8* 2.9* 2.9* 2.7*   No results for input(s): LIPASE, AMYLASE in the last 168 hours. Recent Labs  Lab 02/26/19 1056  AMMONIA 13   Coagulation Profile: No results for input(s): INR, PROTIME in the last 168 hours. Cardiac Enzymes: No results for input(s): CKTOTAL, CKMB, CKMBINDEX, TROPONINI in the last 168 hours. BNP (last 3 results) No results for input(s): PROBNP in the last 8760 hours. HbA1C: No results for input(s): HGBA1C in the last 72 hours. CBG: Recent Labs  Lab 03/03/19 0427 03/03/19 0613 03/03/19 0739 03/03/19 0949 03/03/19 1216  GLUCAP 83 123* 103* 89 117*  Lipid Profile: No results for input(s): CHOL, HDL, LDLCALC, TRIG, CHOLHDL, LDLDIRECT in the last 72 hours. Thyroid Function Tests: Recent Labs    03/02/19 1423  TSH 2.466   Anemia Panel: No results for input(s): VITAMINB12, FOLATE, FERRITIN, TIBC, IRON, RETICCTPCT in the last 72 hours. Sepsis Labs: No results for input(s): PROCALCITON, LATICACIDVEN in the last 168 hours.  Recent Results (from the past 240 hour(s))  Novel Coronavirus, NAA ([facility])     Status: None   Collection Time: 02/23/19 12:00 AM   Specimen: Nasopharyngeal(NP) swabs in vial transport medium   NASOPHARYNGE  TESTING  Result Value Ref Range Status   SARS-CoV-2, NAA Not Detected Not Detected Final    Comment: This nucleic acid amplification test was developed and its performance characteristics determined by [employer]. Nucleic acid amplification tests include PCR and TMA. This test has not been FDA cleared or approved. This test has been authorized by FDA under an Emergency Use Authorization (EUA). This test is only authorized for the duration of time the declaration that circumstances exist justifying the authorization of the emergency use of in vitro diagnostic tests for detection of SARS-CoV-2 virus and/or  diagnosis of COVID-19 infection under section 564(b)(1) of the Act, 21 U.S.C. 470JGG-8(Z) (1), unless the authorization is terminated or revoked sooner. When diagnostic testing is negative, the possibility of a false negative result should be considered in the context of a patient's recent exposures and the presence of clinical signs and symptoms consistent with COVID-19. An individual without symptoms of COVID-19 and who is not shedding SARS-CoV-2 virus would  expect to have a negative (not detected) result in this assay.   SARS Coronavirus 2 Ag (30 min TAT) - Nasal Swab (BD Veritor Kit)     Status: None   Collection Time: 02/23/19  7:21 PM   Specimen: Nasal Swab (BD Veritor Kit)  Result Value Ref Range Status   SARS Coronavirus 2 Ag NEGATIVE NEGATIVE Final    Comment: (NOTE) SARS-CoV-2 antigen NOT DETECTED.  Negative results are presumptive.  Negative results do not preclude SARS-CoV-2 infection and should not be used as the sole basis for treatment or other patient management decisions, including infection  control decisions, particularly in the presence of clinical signs and  symptoms consistent with COVID-19, or in those who have been in contact with the virus.  Negative results must be combined with clinical observations, patient history, and epidemiological information. The expected result is Negative. Fact Sheet for Patients: PodPark.tn Fact Sheet for Healthcare Providers: GiftContent.is This test is not yet approved or cleared by the [place] FDA and  has been authorized for detection and/or diagnosis of SARS-CoV-2 by FDA under an Emergency Use Authorization (EUA).  This EUA will remain in effect (meaning this test can be used) for the duration of  the COVID-19 de claration under Section 564(b)(1) of the Act, 21 U.S.C. section 360bbb-3(b)(1), unless the authorization is terminated or revoked sooner. Performed  at [facility], [address] [place], [place] [address]   Culture, blood (Routine X 2) w Reflex to ID Panel     Status: None   Collection Time: 02/23/19  7:25 PM   Specimen: BLOOD  Result Value Ref Range Status   Specimen Description   Final    BLOOD BLOOD LEFT WRIST Performed at [facility], [address] [place], [place] [address]    Special Requests   Final    BOTTLES DRAWN AEROBIC AND ANAEROBIC Blood Culture  adequate volume Performed at [facility], [address] [place], [place] [address]    Culture   Final    NO GROWTH 5 DAYS Performed at [facility], [place] [address], [place], [place] [address]    Report Status 03/01/2019 FINAL  Final  Culture, blood (Routine X 2) w Reflex to ID Panel     Status: None   Collection Time: 02/23/19  7:30 PM   Specimen: BLOOD  Result Value Ref Range Status   Specimen Description   Final    BLOOD LEFT ANTECUBITAL Performed at [facility], [address] [place], [place] [address]    Special Requests   Final    BOTTLES DRAWN AEROBIC AND ANAEROBIC Blood Culture adequate volume Performed at [facility], [address] [place], [place] [address]    Culture   Final    NO GROWTH 5 DAYS Performed at [facility], [place] [address], [place], [place] [address]    Report Status 03/01/2019 FINAL  Final  SARS CORONAVIRUS 2 (TAT 6-24 HRS) Nasopharyngeal Nasopharyngeal Swab     Status: None   Collection Time: 02/23/19 10:06 PM   Specimen: Nasopharyngeal Swab  Result Value Ref Range Status   SARS Coronavirus 2 NEGATIVE NEGATIVE Final    Comment: (NOTE) SARS-CoV-2 target nucleic acids are NOT DETECTED. The SARS-CoV-2 RNA is generally detectable in upper and lower respiratory specimens during the acute phase of infection. Negative results do not preclude SARS-CoV-2 infection, do not rule out co-infections with other pathogens, and should not be used as the sole basis for  treatment or other patient management decisions. Negative results must be combined with clinical observations, patient history, and epidemiological information. The expected result is Negative. Fact Sheet for Patients: SugarRoll.be Fact Sheet for Healthcare Providers: https://www.woods-mathews.com/ This test is not yet approved or cleared by the [place] FDA and  has been authorized for detection and/or diagnosis of SARS-CoV-2 by FDA under an Emergency Use Authorization (EUA). This EUA will remain  in effect (meaning this test can be used) for the duration of the COVID-19 declaration under Section 56 4(b)(1) of the Act, 21 U.S.C. section 360bbb-3(b)(1), unless the authorization is terminated or revoked sooner. Performed at [facility], [place] [address], [place],  [address]   Respiratory Panel by RT PCR (Flu A&B, Covid) - Nasopharyngeal Swab     Status: None   Collection Time: 02/25/19 11:01 AM   Specimen: Nasopharyngeal Swab  Result Value Ref Range Status   SARS Coronavirus 2 by RT PCR NEGATIVE NEGATIVE Final    Comment: (NOTE) SARS-CoV-2 target nucleic acids are NOT DETECTED. The SARS-CoV-2 RNA is generally detectable in upper respiratoy specimens during the acute phase of infection. The lowest concentration of SARS-CoV-2 viral copies this assay can detect is 131 copies/mL. A negative result does not preclude SARS-Cov-2 infection and should not be used as the sole basis for treatment or other patient management decisions. A negative result may occur with  improper specimen collection/handling, submission of specimen other than nasopharyngeal swab, presence of viral mutation(s) within the areas targeted by this assay, and inadequate number of viral copies (<131 copies/mL). A negative result must be combined with clinical observations, patient history, and epidemiological information. The expected result is Negative. Fact  Sheet for Patients:  PinkCheek.be Fact Sheet for Healthcare Providers:  GravelBags.it This test is not yet ap proved or cleared by the [place] and  has been authorized for detection and/or diagnosis of SARS-CoV-2 by FDA under an Emergency Use Authorization (EUA). This EUA will remain  in effect (meaning this test can be used) for the duration of the COVID-19 declaration under Section 564(b)(1) of the Act, 21 U.S.C. section 360bbb-3(b)(1), unless the authorization is terminated or revoked sooner.    Influenza A by PCR NEGATIVE NEGATIVE Final   Influenza B by PCR NEGATIVE NEGATIVE Final    Comment: (NOTE) The Xpert Xpress SARS-CoV-2/FLU/RSV assay is intended as an aid in  the diagnosis of influenza from Nasopharyngeal swab specimens and  should not be used as a sole basis for treatment. Nasal washings and  aspirates are unacceptable for Xpert Xpress SARS-CoV-2/FLU/RSV  testing. Fact Sheet for Patients: PinkCheek.be Fact Sheet for Healthcare Providers: GravelBags.it This test is not yet approved or cleared by the [place] FDA and  has been authorized for detection and/or diagnosis of SARS-CoV-2 by  FDA under an Emergency Use Authorization (EUA). This EUA will remain  in effect (meaning this test can be used) for the duration of the  Covid-19 declaration under Section 564(b)(1) of the Act, 21  U.S.C. section 360bbb-3(b)(1), unless the authorization is  terminated or revoked. Performed at [facility], [place] [address] [place], [place] [address]   Culture, blood (routine x 2) Call MD if unable to obtain prior to antibiotics being given     Status: None   Collection Time: 02/25/19  1:00 PM   Specimen: BLOOD  Result Value Ref Range Status   Specimen Description BLOOD LEFT ANTECUBITAL  Final   Special Requests   Final    BOTTLES DRAWN AEROBIC AND  ANAEROBIC Blood Culture adequate volume   Culture   Final    NO GROWTH 5 DAYS Performed at [facility], [place] [address], [place], [place] [address]    Report Status 03/02/2019 FINAL  Final  Culture, blood (routine x 2) Call MD if unable to obtain prior to antibiotics being given     Status: None   Collection Time: 02/25/19  1:16 PM   Specimen: BLOOD  Result Value Ref Range Status   Specimen Description BLOOD BLOOD LEFT FOREARM  Final   Special Requests   Final    BOTTLES DRAWN AEROBIC AND ANAEROBIC Blood Culture results may not be optimal due to an inadequate volume of blood received in culture bottles   Culture   Final    NO GROWTH 5 DAYS Performed at [facility], [place] [address], [place], [place] [address]    Report Status 03/02/2019 FINAL  Final  MRSA PCR Screening     Status: None   Collection Time: 02/26/19  2:08 AM   Specimen: Nasopharyngeal  Result Value Ref Range Status   MRSA by PCR NEGATIVE NEGATIVE Final    Comment:        The GeneXpert MRSA Assay (FDA approved for NASAL specimens only), is one component of a comprehensive MRSA colonization surveillance program. It is not intended to diagnose MRSA infection nor to guide or monitor treatment for MRSA infections. Performed at [facility], [place] [address] [place], [place] [address]          Radiology Studies: No results found.      Scheduled Meds: . calcium carbonate  2 tablet Oral TID WC  . Chlorhexidine Gluconate Cloth  6 each Topical Q0600  . Chlorhexidine Gluconate Cloth  6 each Topical Q0600  . Chlorhexidine Gluconate Cloth  6  each Topical Q0600  . heparin  5,000 Units Subcutaneous Q8H  . influenza vaccine adjuvanted  0.5 mL Intramuscular Tomorrow-1000  . lidocaine  1 patch Transdermal Q24H  . mouth rinse  15 mL Mouth Rinse BID  . multivitamin  1 tablet Oral Daily  . sodium chloride flush  3 mL Intravenous Q12H   Continuous Infusions: . sodium chloride    .  piperacillin-tazobactam (ZOSYN)  IV Stopped (03/03/19 1241)          Aline August, MD [facility] 03/03/2019, 12:59 PM

## 2019-03-03 NOTE — Progress Notes (Signed)
[facility] Progress Note  Subjective:  HD on 12/15 and pt kept net even for cramping and hypotension.  States feels better today.    Review of systems:    Denies n/v No chest pain  Denies shortness of breath  About to eat breakfast  Vitals:   03/02/19 1109 03/02/19 1725 03/02/19 2254 03/03/19 0743  BP: 122/73 125/70 126/72 101/64  Pulse: (!) 116 (!) 115 (!) 117 (!) 109  Resp: 18 16 18 16   Temp: (!) 97.4 F (36.3 C)  98.1 F (36.7 C) 98.8 F (37.1 C)  TempSrc: Oral   Oral  SpO2: 98% 99% 98% 100%  Weight: 41.8 kg     Height:        Inpatient medications: . calcium carbonate  2 tablet Oral TID WC  . Chlorhexidine Gluconate Cloth  6 each Topical Q0600  . Chlorhexidine Gluconate Cloth  6 each Topical Q0600  . Chlorhexidine Gluconate Cloth  6 each Topical Q0600  . heparin  5,000 Units Subcutaneous Q8H  . influenza vaccine adjuvanted  0.5 mL Intramuscular Tomorrow-1000  . lidocaine  1 patch Transdermal Q24H  . mouth rinse  15 mL Mouth Rinse BID  . multivitamin  1 tablet Oral Daily  . sodium chloride flush  3 mL Intravenous Q12H   . sodium chloride    . piperacillin-tazobactam (ZOSYN)  IV 2.25 g (03/03/19 0620)   sodium chloride, acetaminophen **OR** acetaminophen, albuterol, calcium carbonate (dosed in mg elemental calcium), docusate sodium, feeding supplement (NEPRO CARB STEADY), fentaNYL (SUBLIMAZE) injection, ondansetron **OR** ondansetron (ZOFRAN) IV, oxyCODONE-acetaminophen, Resource ThickenUp Clear, sodium chloride flush, sorbitol    Exam: General: frail elderly female in bed in NAD at rest Neck No JVD. Trachea midline  Lungs: Clear bilaterally to auscultation unlabored; room air  Heart: S1S2 tachycardic; no rub; no edema Abdomen: Soft, non-tender, ND Musculoskeletal:  Reduced muscle mass on gross exam  Dialysis Access: RUE AVG + thrill and bruit    Dialysis: TTS at [facility] 3.5h  400/A1.5   44.5kg    2K/2.5Ca bath   P4    RUE AVG   Hep 1500 -  Mircera 30 q 2 weeks (last 11/23) - Hectoral 42mcg IV q HD  Assessment/Plan: 1.  AMS: CT head negative. Seen by neuro, EEG c/w severe encephalopathy, no evidence seizures. Improving per report   2. Pneumonia: leukocytosis and fevers down, abx per primary team. Note cefepime dc'd given delirium. COVID/Flu negative, blood cx's negative  3.  ESRD: HD TTS schedule while inpt  4.  Hypertension/volume: controlled   5.  Anemia secondary to ESRD: acceptable/at goal     6.  Metabolic bone disease: hyperphos. Note reduced treatment time inpt. Started tums  7.  FTT - palliative care consulted for [place], patient declining for the last 6-12 mos according to OP HD staff.    Iron/TIBC/Ferritin/ %Sat    Component Value Date/Time   IRON 35 (L) 03/16/2011 1549   TIBC 167 (L) 03/16/2011 1549   FERRITIN 1,410 (H) 02/23/2019 1921   IRONPCTSAT 21 03/16/2011 1549   Recent Labs  Lab 03/03/19 0242  NA 137  K 3.8  CL 93*  CO2 26  GLUCOSE 94  BUN 21  CREATININE 4.75*  CALCIUM 8.4*  PHOS 5.3*  ALBUMIN 2.7*   Recent Labs  Lab 02/27/19 0333  AST 13*  ALT 10  ALKPHOS 74  BILITOT 1.1  PROT 7.6   Recent Labs  Lab 03/02/19 0224  WBC 11.2*  HGB 10.5*  HCT  31.0*  PLT 415*     Claudia Desanctis 03/03/2019 9:07 AM

## 2019-03-03 NOTE — Progress Notes (Signed)
Palliative:  HPI: 68 y.o. female  with past medical history of seizures, ESRD on HD TTS, HTN, Hep C, diabetes, h/o brain aneurysm, ischemic colitis s/p hemicolectomy and ileostomy, lumbar spinal stenosis admitted on 02/25/2019 with SOB and confusion. Was just seen in ED 12/8 and diagnosed with pneumonia but had requested to be discharged. She had missed 2 subsequent HD treatments prior to admission. Despite having dialysis while hospitalized she has continued with confusion and AMS. 12/16 her confusion has improved but still with some overall confusion with people and dates.   I met today again with Ms. Vella Raring. She is alert and able to hold conversation. She still has some confusion and unable to tell when she had dialysis or who has visited her in the hospital. She was unable to tell me her correct birthdate. She wants to "watch her stories" but it is too early I believe. She does not like thickened liquids and hopeful for diet upgrade.   I will reassess tomorrow to see if she is less confused and able to discuss wishes/ Advance Directives. I also plan to touch base with daughter, Elmyra Ricks, as she wants to be present for this conversation. Elmyra Ricks is confused that her mother has been too confused to discuss these topics. If she continues to remain confused may need to consider outpatient palliative care to assist and this would be helpful for continued overall goals of care conversation. I do worry that this confusion is unlikely to completely clear as she has had some beginning issues with confusion at home per [place]. Missing dialysis and acute illness and hospitalization will only make overall chronic confusion worse. CT head does show chronic small vessel disease which could be indicative of some underlying dementia.   All questions/concerns addressed. Emotional support provided to patient.   Exam: Alert, oriented to person and place. Confused at times in conversation. No distress. Thin, frail.  Tachycardia. Breathing regular, unlabored. Abd soft. Generalized weakness.   Plan: - Will benefit from outpatient palliative referral. Please recommend in d/c summary.   - Plan to touch base with daughter, Elmyra Ricks, tomorrow.   [place], NP Palliative Medicine Team Pager [phone] (Please see [email or web address] for schedule) Team Phone [phone]    Greater than 50%  of this time was spent counseling and coordinating care related to the above assessment and plan

## 2019-03-03 NOTE — Evaluation (Signed)
Physical Therapy Evaluation Patient Details Name: [name] MRN: [id number] DOB: 1950/04/26 Today's Date: 03/03/2019   History of Present Illness  Pt is a 68 y/o fmeale with PMH of seizure, HTN, hepatitis C, end stage renal disease on HD, DM2, ischemic coloitis s/p hemicolectomy and ileostomy, brain aneursym presented with SOB and confusion on 02/25/19.  Admitted for respiratory failure 2/2 pneumonia. Found with acute metabolic encphalopathy, CT negative and EEG negative.   Clinical Impression  Patient presents with decreased independence with mobility due to decreased strength, decreased balance, decreased activity tolerance with HR max 132 with standing transfer to [facility].  Feel she will benefit from skilled PT in the acute setting and likely need SNF level rehab upon d/c.      Follow Up Recommendations SNF;Supervision/Assistance - 24 hour    Equipment Recommendations  None recommended by PT    Recommendations for Other Services       Precautions / Restrictions Precautions Precautions: Fall Precaution Comments: watch HR Restrictions Weight Bearing Restrictions: No      Mobility  Bed Mobility Overal bed mobility: Needs Assistance Bed Mobility: Supine to Sit     Supine to sit: HOB elevated;Min assist Sit to supine: Mod assist   General bed mobility comments: used rail and increased time to come up to sit, assist to scoot toward EOB, to supine assist for one leg at a time into bed, assist to scoot up in bed with bed in [place]  Transfers Overall transfer level: Needs assistance Equipment used: Rolling walker (2 wheeled) Transfers: Sit to/from [facility] Sit to Stand: Mod assist;Max assist Stand pivot transfers: Mod assist       General transfer comment: lifting help from EOB to stand, from [facility] mod A with pt pushing up on armrests; stand step to [facility] then to bed mod A for balance/safety; HR max 132  Ambulation/Gait             General  Gait Details: not safe to ambulate away from the bed  Stairs            Wheelchair Mobility    Modified Rankin (Stroke Patients Only)       Balance Overall balance assessment: Needs assistance Sitting-balance support: No upper extremity supported;Feet supported Sitting balance-Leahy Scale: Good Sitting balance - Comments: close supervision for safety    Standing balance support: Bilateral upper extremity supported Standing balance-Leahy Scale: Poor Standing balance comment: UE support and mod A for balance while assist for hygiene after toileting                             Pertinent Vitals/Pain Pain Assessment: Faces Faces Pain Scale: Hurts little more Pain Location: legs Pain Descriptors / Indicators: Aching Pain Intervention(s): Monitored during session    Home Living Family/patient expects to be discharged to:: Private residence Living Arrangements: Other relatives(grandaughter and her 2 kids) Available Help at Discharge: Family;Available PRN/intermittently(grandaughter works during the day) Type of Home: House Home Access: Level entry     Home Layout: One [place]: Environmental consultant - 2 wheels;Wheelchair - manual;Tub bench;Other (comment)(AFO's) Additional Comments: reports other grandaughter can assist during the day, does not have 24/7 support     Prior Function Level of Independence: Independent with assistive device(s)         Comments: pt reports using wc for mobility around the house, transferring to wc independently; completing ADLs independently from wc and limited iADLs  Hand Dominance   Dominant Hand: Right    Extremity/Trunk Assessment   Upper Extremity Assessment Upper Extremity Assessment: Defer to OT evaluation    Lower Extremity Assessment Lower Extremity Assessment: LLE deficits/detail;RLE deficits/detail RLE Deficits / Details: AAROM WFL, foot drop with ankle DF 0/5, hip flexion 2/5, knee extension 4-/5 RLE  Sensation: history of peripheral neuropathy LLE Deficits / Details: AROM WFL, foot drop with ankle DF 2/5, hip flexion 3/5, knee extension 4/5 LLE Sensation: history of peripheral neuropathy       Communication   Communication: No difficulties  Cognition Arousal/Alertness: Awake/alert Behavior During Therapy: WFL for tasks assessed/performed Overall Cognitive Status: Impaired/Different from baseline Area of Impairment: Problem solving;Attention;Memory;Orientation                 Orientation Level: Disoriented to;Situation Current Attention Level: Sustained Memory: Decreased short-term memory Following Commands: Follows one step commands consistently;Follows one step commands with increased time;Follows multi-step commands inconsistently   Awareness: Intellectual Problem Solving: Slow processing General Comments: pt oriented x 3 (not to situation), follows 1 step commands with increased time, fair understanding for increased need of assist       General Comments General comments (skin integrity, edema, etc.): HR 132 with standing    Exercises     Assessment/Plan    PT Assessment Patient needs continued PT services  PT Problem List Decreased strength;Decreased mobility;Decreased activity tolerance;Decreased balance;Decreased knowledge of use of DME;Decreased safety awareness;Cardiopulmonary status limiting activity       PT Treatment Interventions DME instruction;Therapeutic activities;Balance training;Therapeutic exercise;Functional mobility training;Patient/family education;Wheelchair mobility training    PT Goals (Current goals can be found in the Care Plan section)  Acute Rehab PT Goals Patient Stated Goal: to get stronger PT Goal Formulation: With patient Time For Goal Achievement: 03/17/19 Potential to Achieve Goals: Good    Frequency Min 2X/week   Barriers to discharge        Co-evaluation               AM-PAC PT "6 Clicks" Mobility  Outcome  Measure Help needed turning from your back to your side while in a flat bed without using bedrails?: A Little Help needed moving from lying on your back to sitting on the side of a flat bed without using bedrails?: A Lot Help needed moving to and from a bed to a chair (including a wheelchair)?: A Lot Help needed standing up from a chair using your arms (e.g., wheelchair or bedside chair)?: Total Help needed to walk in hospital room?: Total Help needed climbing 3-5 steps with a railing? : Total 6 Click Score: 10    End of Session   Activity Tolerance: Patient tolerated treatment well Patient left: in bed;with call bell/phone within reach;with bed alarm set   PT Visit Diagnosis: Muscle weakness (generalized) (M62.81);Other abnormalities of gait and mobility (R26.89)    Time: [id number] PT Time Calculation (min) (ACUTE ONLY): 29 min   Charges:   PT Evaluation $PT Eval Moderate Complexity: 1 Mod PT Treatments $Therapeutic Activity: 8-22 mins        Magda Kiel, [place] Acute Rehabilitation Services [phone] 03/03/2019   Reginia Naas 03/03/2019, 5:12 PM

## 2019-03-03 NOTE — Progress Notes (Signed)
Modified Barium Swallow Progress Note  Patient Details  Name: GURSIMRAN THEALL MRN: [id number] Date of Birth: Jun 02, 1950  Today's Date: 03/03/2019  Modified Barium Swallow completed.  Full report located under Chart Review in the Imaging Section.  Brief recommendations include the following:  Clinical Impression  Pt appeared more lucid compared to yesterday during today's MBS. She demonstrated minimal oropharyngeal dysphagia with decreased aspiration risk. Timing of swallow initiation and laryngeal closure adequate with varying bolus sizes and textures wtihout aspiration or penetration during this study. Slower mastication and transit of regular texture. Minimal vallecular and pyriform sinus residue. She does have prominent cervical bony protrusion and appears to be slightly fused (?) near C 4-5 resulting in slower transit through cervical esophagus and promiment cricopharyngeus. No difficulty with barium pill and thin. Recommend continue Dys, return to thin liquids, straws allowed, pills with thin. Will follow for diet advancement.      Swallow Evaluation Recommendations       SLP Diet Recommendations: Thin liquid;Dysphagia 2 (Fine chop) solids   Liquid Administration via: Cup;Straw   Medication Administration: Whole meds with puree   Supervision: Patient able to self feed;Intermittent supervision to cue for compensatory strategies   Compensations: Slow rate;Small sips/bites   Postural Changes: Seated upright at 90 degrees   Oral Care Recommendations: Oral care BID        Houston Siren 03/03/2019,1:47 PM Orbie Pyo [place].Ed Risk analyst [phone] Office [phone]

## 2019-03-03 NOTE — Evaluation (Addendum)
Occupational Therapy Evaluation Patient Details Name: [name] MRN: [id number] DOB: Jun 08, 1950 Today's Date: 03/03/2019    History of Present Illness Pt is a 68 y/o fmeale with PMH of seizure, HTN, hepatitis C, end stage renal disease on HD, DM2, ischemic coloitis s/p hemicolectomy and ileostomy, brain aneursym presented with SOB and confusion on 02/25/19.  Admitted for respiratory failure 2/2 pneumonia. Found with acute metabolic encphalopathy, CT negative and EEG negative.    Clinical Impression   PTA patient reports modified independent with ADLs from w/c level, able to self propel w/c and transfer into w/c with independence.  She was admitted for above and is limited by problem list below, including generalized weakness, decreased activity tolerance, impaired balance, and impaired cognition.  Cognitively, she is oriented x 3, follows 1 step commands with increased time, decreased attention to task, following multi-step commands and sequencing/problem solving. She currently requires min assist for UB ADLs, max assist for LB ADLs, mod assist for simulated lateral scooting at EOB and mod assist for bed mobility.  She reports her granddaughter lives with her, but she works during the day; mentions another granddaughter who could stay during the day but unclear with support available. Believe patient will best benefit from continued OT services while admitted and after dc at SNF level in order to optimize independence and return to PLOF prior to dc home. Will follow.     Follow Up Recommendations  SNF;Supervision/Assistance - 24 hour    Equipment Recommendations  3 in 1 bedside commode    Recommendations for Other Services PT consult     Precautions / Restrictions Precautions Precautions: Fall Precaution Comments: watch HR Restrictions Weight Bearing Restrictions: No      Mobility Bed Mobility Overal bed mobility: Needs Assistance Bed Mobility: Supine to Sit;Sit to Supine      Supine to sit: Mod assist;HOB elevated Sit to supine: Mod assist   General bed mobility comments: patient transitioned to EOB with support of trunk given increased time to manage LEs to EOB; returned to supine with support of LEs back to bed  Transfers                 General transfer comment: simulated lateral scooting towards HOB with max assist given cueing for hand placement, technique; limited as IV time arrived to place IV    Balance Overall balance assessment: Needs assistance Sitting-balance support: No upper extremity supported;Feet supported Sitting balance-Leahy Scale: Fair Sitting balance - Comments: close supervision for safety                                    ADL either performed or assessed with clinical judgement   ADL Overall ADL's : Needs assistance/impaired     Grooming: Set up;Sitting   Upper Body Bathing: Minimal assistance;Sitting   Lower Body Bathing: Maximal assistance;Sitting/lateral leans   Upper Body Dressing : Minimal assistance;Sitting   Lower Body Dressing: Maximal assistance;Sitting/lateral leans     Toilet Transfer Details (indicate cue type and reason): simulated lateral scoots to [facility] with max assist          Functional mobility during ADLs: Maximal assistance;Cueing for safety;Cueing for sequencing General ADL Comments: limited by generalized weakness, impaired balance, decreased activity tolerance     Vision   Vision Assessment?: No apparent visual deficits     Perception     Praxis      Pertinent Vitals/Pain Pain Assessment:  Faces Faces Pain Scale: Hurts a little bit Pain Location: generalized  Pain Descriptors / Indicators: Sore Pain Intervention(s): Monitored during session;Repositioned;Patient requesting pain meds-RN notified     Hand Dominance Right   Extremity/Trunk Assessment Upper Extremity Assessment Upper Extremity Assessment: Generalized weakness   Lower Extremity Assessment Lower  Extremity Assessment: Defer to PT evaluation       Communication Communication Communication: No difficulties   Cognition Arousal/Alertness: Awake/alert Behavior During Therapy: WFL for tasks assessed/performed Overall Cognitive Status: Impaired/Different from baseline Area of Impairment: Following commands;Awareness;Problem solving;Attention;Memory;Orientation                 Orientation Level: Disoriented to;Situation Current Attention Level: Sustained Memory: Decreased short-term memory Following Commands: Follows one step commands consistently;Follows one step commands with increased time;Follows multi-step commands inconsistently   Awareness: Intellectual Problem Solving: Slow processing;Difficulty sequencing;Requires verbal cues General Comments: pt oriented x 3 (not to situation), follows 1 step commands with increased time, fair understanding for increased need of assist    General Comments  pt HR ranged from 120-125 during session    Exercises     Shoulder Instructions      Home Living Family/patient expects to be discharged to:: Private residence Living Arrangements: Other relatives(granddaughter and her 2 kids ) Available Help at Discharge: Family;Available PRN/intermittently(granddaughter works during the day ) Type of Home: House Home Access: Level entry     [place]: One level     Bathroom Shower/Tub: Teacher, early years/pre: Standard Bathroom Accessibility: Yes How Accessible: Accessible via wheelchair;Accessible via walker Home Equipment: Gilford Rile - 2 wheels;Wheelchair - manual;Tub bench   Additional Comments: reports other grandaughter can assist during the day, does not have 24/7 support       Prior Functioning/Environment Level of Independence: Independent with assistive device(s)        Comments: pt reports using wc for mobility around the house, transferring to wc independently; completing ADLs independently from wc and  limited iADLs         OT Problem List: Decreased strength;Decreased activity tolerance;Impaired balance (sitting and/or standing);Pain;Decreased knowledge of precautions;Decreased knowledge of use of DME or AE;Decreased safety awareness;Decreased cognition;Decreased range of motion      OT Treatment/Interventions: Self-care/ADL training;DME and/or AE instruction;Therapeutic exercise;Therapeutic activities;Cognitive remediation/compensation;Balance training;Patient/family education    OT Goals(Current goals can be found in the care plan section) Acute Rehab OT Goals Patient Stated Goal: to get stronger OT Goal Formulation: With patient Time For Goal Achievement: 03/17/19 Potential to Achieve Goals: Good  OT Frequency: Min 2X/week   Barriers to D/C:            Co-evaluation              AM-PAC OT "6 Clicks" Daily Activity     Outcome Measure Help from another person eating meals?: A Little Help from another person taking care of personal grooming?: A Little Help from another person toileting, which includes using toliet, bedpan, or urinal?: A Lot Help from another person bathing (including washing, rinsing, drying)?: A Lot Help from another person to put on and taking off regular upper body clothing?: A Little Help from another person to put on and taking off regular lower body clothing?: A Lot 6 Click Score: 15   End of Session Nurse Communication: Mobility status;Patient requests pain meds  Activity Tolerance: Patient tolerated treatment well Patient left: in bed;with call bell/phone within reach;with nursing/sitter in room  OT Visit Diagnosis: Other abnormalities of gait and mobility (R26.89);Muscle weakness (generalized) (  M62.81);Pain;Other symptoms and signs involving cognitive function Pain - part of body: (generalized)                Time: 1435-1450 OT Time Calculation (min): 15 min Charges:  OT General Charges $OT Visit: 1 Visit OT Evaluation $OT Eval  Moderate Complexity: 1 Mod  Jolaine Artist, OT Acute Rehabilitation Services Pager [phone] Office [phone]   Delight Stare 03/03/2019, 3:07 PM

## 2019-03-03 NOTE — Care Management Important Message (Signed)
Important Message  Patient Details  Name: [name] MRN: [id number] Date of Birth: December 26, 1950   Medicare Important Message Given:  Yes     Mikeala Girdler 03/03/2019, 12:11 PM

## 2019-03-04 DIAGNOSIS — G9341 Metabolic encephalopathy: Secondary | ICD-10-CM

## 2019-03-04 LAB — CBC WITH DIFFERENTIAL/PLATELET
Abs Immature Granulocytes: 0.29 10*3/uL — ABNORMAL HIGH (ref 0.00–0.07)
Basophils Absolute: 0.1 10*3/uL (ref 0.0–0.1)
Basophils Relative: 1 %
Eosinophils Absolute: 0.6 10*3/uL — ABNORMAL HIGH (ref 0.0–0.5)
Eosinophils Relative: 6 %
HCT: 29.3 % — ABNORMAL LOW (ref 36.0–46.0)
Hemoglobin: 9.8 g/dL — ABNORMAL LOW (ref 12.0–15.0)
Immature Granulocytes: 3 %
Lymphocytes Relative: 17 %
Lymphs Abs: 1.9 10*3/uL (ref 0.7–4.0)
MCH: 26.9 pg (ref 26.0–34.0)
MCHC: 33.4 g/dL (ref 30.0–36.0)
MCV: 80.5 fL (ref 80.0–100.0)
Monocytes Absolute: 1.1 10*3/uL — ABNORMAL HIGH (ref 0.1–1.0)
Monocytes Relative: 9 %
Neutro Abs: 7.6 10*3/uL (ref 1.7–7.7)
Neutrophils Relative %: 64 %
Platelets: 366 10*3/uL (ref 150–400)
RBC: 3.64 MIL/uL — ABNORMAL LOW (ref 3.87–5.11)
RDW: 15.9 % — ABNORMAL HIGH (ref 11.5–15.5)
WBC: 11.6 10*3/uL — ABNORMAL HIGH (ref 4.0–10.5)
nRBC: 0 % (ref 0.0–0.2)

## 2019-03-04 LAB — MAGNESIUM: Magnesium: 2.1 mg/dL (ref 1.7–2.4)

## 2019-03-04 LAB — RENAL FUNCTION PANEL
Albumin: 2.6 g/dL — ABNORMAL LOW (ref 3.5–5.0)
Anion gap: 20 — ABNORMAL HIGH (ref 5–15)
BUN: 36 mg/dL — ABNORMAL HIGH (ref 8–23)
CO2: 24 mmol/L (ref 22–32)
Calcium: 7.7 mg/dL — ABNORMAL LOW (ref 8.9–10.3)
Chloride: 93 mmol/L — ABNORMAL LOW (ref 98–111)
Creatinine, Ser: 6.77 mg/dL — ABNORMAL HIGH (ref 0.44–1.00)
GFR calc Af Amer: 7 mL/min — ABNORMAL LOW (ref >60)
GFR calc non Af Amer: 6 mL/min — ABNORMAL LOW (ref >60)
Glucose, Bld: 103 mg/dL — ABNORMAL HIGH (ref 70–99)
Phosphorus: 5.4 mg/dL — ABNORMAL HIGH (ref 2.5–4.6)
Potassium: 3.5 mmol/L (ref 3.5–5.1)
Sodium: 137 mmol/L (ref 135–145)

## 2019-03-04 MED ORDER — OXYCODONE-ACETAMINOPHEN 10-325 MG PO TABS: 0.5000 | ORAL_TABLET | Freq: Two times a day (BID) | ORAL | Status: DC | PRN

## 2019-03-04 MED ORDER — OXYCODONE-ACETAMINOPHEN 5-325 MG PO TABS: 1.0000 | ORAL_TABLET | Freq: Two times a day (BID) | ORAL | Status: DC | PRN

## 2019-03-04 MED ORDER — MIRTAZAPINE 15 MG PO TABS: 7.5000 mg | ORAL_TABLET | Freq: Every day | ORAL | Status: DC

## 2019-03-04 MED ORDER — DARBEPOETIN ALFA 40 MCG/0.4ML IJ SOSY
40.0000 ug | PREFILLED_SYRINGE | Freq: Once | INTRAMUSCULAR | Status: DC
  Filled 2019-03-04: qty 0.4

## 2019-03-04 MED ORDER — SENNA 8.6 MG PO TABS: 1.0000 | ORAL_TABLET | Freq: Two times a day (BID) | ORAL | 0 refills | Status: DC | PRN

## 2019-03-04 MED ORDER — POLYETHYLENE GLYCOL 3350 17 G PO PACK: 17.0000 g | PACK | Freq: Every day | ORAL | 0 refills | Status: DC | PRN

## 2019-03-04 NOTE — TOC Initial Note (Signed)
Transition of Care [facility]) - Initial/Assessment Note    Patient Details  Name: [name] MRN: [id number] Date of Birth: 1950-08-06  Transition of Care [facility]) CM/SW Contact:    Benard Halsted, LCSW Phone Number: 03/04/2019, 11:42 AM  Clinical Narrative:                 CSW received consult for possible SNF placement at time of discharge. CSW spoke with patient regarding PT recommendation of SNF placement at time of discharge. Patient reported that she lives at home with her granddaughter (79 years old) and her two daughters rotate on coming in to assist and transport her to dialysis. She is declining SNF due to fears of COVID at this time. She gave CSW permission to speak with her daughter, Elmyra Ricks, to confirm plan. CSW spoke with patient's daughter. She is adamantly refusing SNF placement. She states patient will have 24 hour support at home. She asked questions regarding if their family member could be paid to sit with patient. CSW explained home health process and how that person would have to be approved through an agency. Patient reports agreement for [facility] since they are in network with her insurance. CSW sent referral for review and it was accepted for PT/OT/aide services. CSW provided Medicare [place] ratings list. CSW discussed equipment needs with patient and daughter requested a rolling walker. CSW will reach out to [employer] for delivery to the room. CSW confirmed PCP and address with patient. Patient states her daughter will come pick her up at discharge. No further questions reported at this time.    Expected Discharge Plan: [place] Barriers to Discharge: Continued Medical Work up   Patient Goals and CMS Choice Patient states their goals for this hospitalization and ongoing recovery are:: Return home CMS Medicare.gov Compare Post Acute Care list provided to:: Patient(and daughter, Elmyra Ricks) Choice offered to / list presented to : Patient, Adult Children  Expected  Discharge Plan and Services Expected Discharge Plan: [place] In-house Referral: Clinical Social Work Discharge Planning Services: NA Post Acute Care Choice: Durable Medical Equipment, Home Health Living arrangements for the past 2 months: Single Family Home                 DME Arranged: Walker rolling with seat DME Agency: [facility]       HH Arranged: PT, OT, Nurse's Aide [place] Agency: [facility] Date [place]: 03/04/19 Time HH Agency Contacted: [id number] Representative spoke with at [place] Arrangements/Services Living arrangements for the past 2 months: [place] Lives with:: Relatives(Granddaughter (76 yo)) Patient language and need for interpreter reviewed:: Yes Do you feel safe going back to the place where you live?: Yes      Need for Family Participation in Patient Care: Yes (Comment) Care giver support system in place?: Yes (comment)   Criminal Activity/Legal Involvement Pertinent to Current Situation/Hospitalization: No - Comment as needed  Activities of Daily Living      Permission Sought/Granted Permission sought to share information with : Facility Art therapist granted to share information with : Yes, Verbal Permission Granted  Share Information with NAME: Elmyra Ricks  Permission granted to share info w AGENCY: [place] granted to share info w Relationship: Daughter  Permission granted to share info w Contact Information: [phone]  Emotional Assessment Appearance:: Appears stated age Attitude/Demeanor/Rapport: Engaged, Gracious Affect (typically observed): Accepting, Appropriate, Pleasant Orientation: : Oriented to Self, Oriented  to Place, Oriented to Situation Alcohol / Substance Use: Not Applicable Psych Involvement: No (comment)  Admission diagnosis:  Pneumonia [J18.9] Patient Active Problem List   Diagnosis Date Noted  . Pneumonia 02/25/2019  .  Uremia 02/25/2019  . HCAP (healthcare-associated pneumonia) 02/23/2019  . Complication of vascular access for dialysis 01/17/2019  . Rheumatoid arthritis ([place]) 11/16/2018  . Complication of vascular dialysis catheter 04/15/2016  . At high risk for falls 05/19/2015  . Bilateral foot-drop 05/19/2015  . History of spontaneous subarachnoid intracranial hemorrhage due to cerebral aneurysm 05/19/2015  . Tobacco dependence 05/19/2015  . Underimmunization status 05/06/2015  . Headache 12/08/2013  . Fever, unspecified 12/08/2013  . Pruritus, unspecified 12/08/2013  . Diarrhea, unspecified 12/02/2013  . Pain, unspecified 12/02/2013  . Coagulation defect, unspecified ([place]) 11/29/2013  . Muscle weakness (generalized) 05/15/2012  . Abnormality of gait 05/15/2012  . Other acquired deformity of ankle and foot(736.79) 05/15/2012  . Iron deficiency anemia, unspecified 02/17/2012  . Lumbar radiculitis 01/17/2012  . Post herpetic neuralgia 11/25/2011  . Leg pain, right 10/14/2011  . Valtrex overdose 10/01/2011  . Shingles rash 09/30/2011  . Body mass index (BMI) less than or equal to 19 in adult 07/10/2011  . Unspecified protein-calorie malnutrition ([place]) 06/12/2011  . Preop cardiovascular exam 05/28/2011  . Sinus tachycardia 04/23/2011  . Aortic stenosis 04/23/2011  . Ileostomy in place [facility]) 04/22/2011  . Other specified cardiac arrhythmias 04/04/2011  . Other acquired deformities of unspecified foot 03/22/2011  . Anemia of chronic kidney failure 02/09/2011  . Acute ischemic colitis ([place]) 02/09/2011  . Neuralgia and neuritis, unspecified 02/01/2011  . Lumbar stenosis with neurogenic claudication 01/30/2011  . Spondylolisthesis of lumbar region 01/30/2011  . End stage renal disease on dialysis ([place]) 01/30/2011  . Hypertension 01/30/2011  . Unspecified viral hepatitis C without hepatic coma 01/30/2011  . Hepatitis C antibody test positive 01/30/2011  . Alcohol abuse, uncomplicated [id number]   . Awaiting organ transplant status 03/17/2009  . Benign neoplasm of unspecified kidney 03/17/2009  . Disorder of phosphorus metabolism, unspecified 03/17/2009  . Hypercalcemia 03/17/2009  . Other long term (current) drug therapy 03/17/2009  . Personal history of pneumonia (recurrent) 03/17/2009  . Patient's noncompliance with other medical treatment and regimen 03/17/2009  . Polycystic kidney, adult type 03/17/2009  . Acquired absence of kidney 06/21/2008  . Disorder of kidney and ureter, unspecified 06/14/2008  . Other abnormal glucose 05/06/2006  . Type 2 diabetes mellitus without complications ([place]) [id number]  . Secondary hyperparathyroidism of renal origin ([place]) 12/17/2002  . Postprocedural hypoparathyroidism ([place]) 01/16/2001  . Contact with and (suspected) exposure to potentially hazardous body fluids 03/17/2000  . Other intervertebral disc degeneration, lumbosacral region 09/06/1999  . Allergy status to other drugs, medicaments and biological substances status 02/03/1998  . Hypertensive chronic kidney disease with stage 5 chronic kidney disease or end stage renal disease ([place]) 02/03/1998  . Nonrheumatic mitral (valve) insufficiency 02/03/1998  . Dependence on renal dialysis ([place]) 03/18/1992   PCP:  Janie Morning, DO Pharmacy:   [facility] [place] [id number] [place] [id number] Phone: [phone] Fax: [phone]  [id number] [place], [place] [address] [place] [address] Phone: [phone] Fax: [phone]     Social Determinants of Health (SDOH) Interventions    Readmission Risk Interventions No flowsheet data found.

## 2019-03-04 NOTE — Progress Notes (Signed)
   Vital Signs MEWS/VS Documentation      03/03/2019 1911 03/03/2019 2120 03/03/2019 2251 03/04/2019 0739   MEWS Score:  2  2  2  2    MEWS Score Color:  Yellow  Yellow  Yellow  Yellow   Resp:  --  --  20  16   Pulse:  --  --  (!) 113  (!) 102   BP:  --  --  110/67  (!) 87/61   Temp:  --  --  98.2 F (36.8 C)  --   O2 Device:  --  --  --  Room Air   Level of Consciousness:  --  Alert  --  --      No acute change. MD aware. No new orders.     Candis Shine Deanne 03/04/2019,9:01 AM

## 2019-03-04 NOTE — Progress Notes (Addendum)
[facility] Progress Note  Subjective:  Last HD on 12/15 without UF - cramping and hypotension.  Noted hypotension this AM.  On dynamap on my exam BP 96/67 and HR 103.  She denies any dizziness.  Thinks she is going to get rehab at home.  Primary team note indicates PT recommending SNF and plan for d/c once bed available.  Review of systems:    Denies n/v No chest pain  Denies shortness of breath   Vitals:   03/03/19 0743 03/03/19 1620 03/03/19 2251 03/04/19 0739  BP: 101/64 103/83 110/67 (!) 87/61  Pulse: (!) 109 (!) 116 (!) 113 (!) 102  Resp: 16 16 20 16   Temp: 98.8 F (37.1 C) 98.9 F (37.2 C) 98.2 F (36.8 C)   TempSrc: Oral Oral  Oral  SpO2: 100% 91% 98% 99%  Weight:      Height:        Inpatient medications: . calcium carbonate  2 tablet Oral TID WC  . Chlorhexidine Gluconate Cloth  6 each Topical Q0600  . heparin  5,000 Units Subcutaneous Q8H  . influenza vaccine adjuvanted  0.5 mL Intramuscular Tomorrow-1000  . lidocaine  1 patch Transdermal Q24H  . mouth rinse  15 mL Mouth Rinse BID  . mirtazapine  7.5 mg Oral QHS  . multivitamin  1 tablet Oral Daily  . sodium chloride flush  3 mL Intravenous Q12H   . sodium chloride     sodium chloride, acetaminophen **OR** acetaminophen, albuterol, calcium carbonate (dosed in mg elemental calcium), docusate sodium, feeding supplement (NEPRO CARB STEADY), fentaNYL (SUBLIMAZE) injection, ondansetron **OR** ondansetron (ZOFRAN) IV, oxyCODONE-acetaminophen, oxyCODONE-acetaminophen, Resource ThickenUp Clear, sodium chloride flush, sorbitol    Exam: General: frail elderly female in bed in NAD at rest  Neck No JVD. Trachea midline  Lungs: Clear bilaterally to auscultation unlabored; room air  Heart: S1S2 tachycardic; no rub; no edema Abdomen: Soft, non-tender, ND Musculoskeletal:  Reduced muscle mass on gross exam  Neuro - awake and conversant today Dialysis Access: RUE AVG + thrill and bruit    Dialysis: TTS  at [facility] 3.5h  400/A1.5   44.5kg    2K/2.5Ca bath   P4    RUE AVG   Hep 1500 - Mircera 30 q 2 weeks (last 11/23) - Hectoral 58mcg IV q HD  Assessment/Plan: 1.  AMS: CT head negative. Seen by neuro, EEG c/w severe encephalopathy, no evidence seizures. Improving per report   2. Pneumonia: leukocytosis and fevers down, abx per primary team. Note cefepime dc'd given delirium. COVID/Flu negative, blood cx's negative  3.  ESRD: HD per TTS schedule   4.  Hypertension/volume: hypotension noted; weight down; no volume on exam - keep even today, 12/17  5.  Anemia secondary to ESRD:  aranesp 40 mcg on 12/17  6.  Metabolic bone disease: hyperphos. Note reduced treatment time inpt. Started tums  7.  FTT - palliative care consulted for [place], patient declining for the last 6-12 mos according to OP HD staff.    Iron/TIBC/Ferritin/ %Sat    Component Value Date/Time   IRON 35 (L) 03/16/2011 1549   TIBC 167 (L) 03/16/2011 1549   FERRITIN 1,410 (H) 02/23/2019 1921   IRONPCTSAT 21 03/16/2011 1549   Recent Labs  Lab 03/04/19 0306  NA 137  K 3.5  CL 93*  CO2 24  GLUCOSE 103*  BUN 36*  CREATININE 6.77*  CALCIUM 7.7*  PHOS 5.4*  ALBUMIN 2.6*   Recent Labs  Lab 02/27/19 [phone]  AST 13*  ALT 10  ALKPHOS 74  BILITOT 1.1  PROT 7.6   Recent Labs  Lab 03/04/19 0306  WBC 11.6*  HGB 9.8*  HCT 29.3*  PLT 366     Claudia Desanctis 03/04/2019 11:31 AM   Patient discussed with primary team - they are planning to send her home today with help of family.  Discussed hypotension - they are repeating pressures and if acceptable/improved will still send her home.  Had planned for no UF at HD today.  She is under her weight.  K acceptable.  Will plan to hold tx today and next tx on 12/19 sat at her unit.  Discussed with HD coordinator.  Claudia Desanctis 03/04/2019 12:05 PM

## 2019-03-04 NOTE — TOC Transition Note (Signed)
Transition of Care [facility]) - CM/SW Discharge Note   Patient Details  Name: [name] MRN: [id number] Date of Birth: 07/21/1950  [facility]) CM/SW Contact:  Benard Halsted, LCSW Phone Number: 03/04/2019, 1:46 PM   Clinical Narrative:    [employer] to deliver rolling walker to patient's room prior to discharge. Patient's daughter aware of plan and is agreeable to Palliative services for patient. Referral sent to [place] program. Please notify daughter for transportation needs. No other needs identified at this time.    Final next level of care: [place] Barriers to Discharge: No Barriers Identified   Patient Goals and CMS Choice Patient states their goals for this hospitalization and ongoing recovery are:: Return home CMS Medicare.gov Compare Post Acute Care list provided to:: Patient(and daughter, Elmyra Ricks) Choice offered to / list presented to : Patient, Adult Children  Discharge Placement                Patient to be transferred to facility by: Car Name of family member notified: Elmyra Ricks, daughter Patient and family notified of of transfer: 03/04/19  Discharge Plan and Services In-house Referral: Clinical Social Work Discharge Planning Services: NA Post Acute Care Choice: Durable Medical Equipment, Home Health          DME Arranged: Walker rolling with seat DME Agency: [employer] Date DME Agency Contacted: 03/04/19 Time DME Agency Contacted: 88 Representative spoke with at DME Agency: Thedore Mins HH Arranged: PT, OT, Nurse's Aide [place] Agency: [facility] [place] Date [place]: 03/04/19 Time [place]: 31 Representative spoke with at [place]: Cassie  Social Determinants of Health ([place]) Interventions     Readmission Risk Interventions No flowsheet data found.

## 2019-03-04 NOTE — Progress Notes (Signed)
Renal Navigator received call from CSW/N. Rayyan that patient will be discharged home with [facility] today-SNF recommendation refused. Navigator spoke with Dr. Royce Macadamia who states patient is stable for discharge today prior to HD tx and can go to next regularly scheduled treatment at her OP HD clinic[facility]. [place] clinic notified.  Alphonzo Cruise, [place] Renal Navigator [phone]

## 2019-03-04 NOTE — Progress Notes (Signed)
Palliative:  HPI: HPI: 68 y.o.femalewith past medical history of seizures, ESRD on HD TTS, HTN, Hep C, diabetes, h/o brain aneurysm, ischemic colitis s/p hemicolectomy and ileostomy, lumbar spinal stenosisadmitted on 12/10/2020with SOB and confusion.Was just seen in ED 12/8 and diagnosed with pneumonia but had requested to be discharged. She had missed 2 subsequent HD treatments prior to admission. Despite having dialysis while hospitalized she has continued with confusion and AMS.12/16 her confusion has improved but still with some overall confusion with people and dates. 12/17 confusion continues to improve.   I met today with Ms. Facemire. She appears much improved and is asking about going home soon. Plans for HD today. I did leave a copy of Living Will/Advance Directives and Hard Choices booklet to take home. She feels much better but still a little more weak. She tells me that she has 24/7 care at home from family.   I called and spoke to daughter, [name]. [name] confirms 24/7 care at home and inquired about her care needs and discharge instructions. Educated that this information will be spelled out in discharge instructions and reviewed PT/OT and SLP recommendations. I also recommended outpatient palliative care follow up to further discuss her mother's wishes and goals of care with family present and [name] agrees with referral.   All questions/concerns addressed. Emotional support provided.   Exam: Alert, more oriented. No distress. HR regular. Breathing regular, unlabored. Abd soft.   Plan: - Home when medically appropriate with outpatient palliative to follow up with patient/family for further goals of care discussions.   20 min   , NP Palliative Medicine Team Pager [phone] (Please see [email or web address] for schedule) Team Phone [phone]    Greater than 50%  of this time was spent counseling and coordinating care related to the above assessment and plan  

## 2019-03-04 NOTE — Discharge Summary (Signed)
Physician Discharge Summary  Jenna SHIPLETT AJO:[id number] DOB: 06-18-1950 DOA: 02/25/2019  PCP: Janie Morning, DO  Admit date: 02/25/2019 Discharge date: 03/04/2019  Admitted From: Home Disposition: Home.  Family refused SNF  Recommendations for Outpatient Follow-up:  1. Follow up with PCP in 1 week  2. Outpatient follow-up with dialysis as scheduled scheduled dialysis was held today because of hypotension. 3. Outpatient evaluation and follow-up by neurology 4. Recommend outpatient evaluation and follow-up by palliative care for goals of care discussion. 5. Follow up in ED if symptoms worsen or new appear   Home Health: PT/OT Equipment/Devices: None  Discharge Condition: Guarded  CODE STATUS: Full Diet recommendation: Heart healthy/renal hemodialysis diet  Brief/Interim Summary: 68 year old female with history of seizure, hypertension, hepatitis C, end-stage renal disease on hemodialysis, diabetes mellitus type 2, ischemic colitis status post hemicolectomy and ileostomy, brain aneurysm presented with shortness of breath on 02/25/2019.  On presentation, she was getting increasingly confused as well.  She was admitted for respiratory failure secondary to pneumonia and started on antibiotics.  Neurology was consulted for encephalopathy.  Head CT was negative.  EEG was negative for seizures.  Neurology signed off.  During hospitalization, she completed 7-day course of antibiotic therapy.  She is currently afebrile and hemodynamically stable.  She is on diet as per SLP recommendations.  She is tolerating inpatient hemodialysis.  PT recommended SNF placement.  Family declined SNF placement.  She will be discharged home with outpatient follow-up with dialysis as scheduled.  She will also benefit from outpatient palliative care evaluation and follow-up.  Discharge Diagnoses:   Healthcare associated pneumonia with concern for gram-negative pneumonia versus MRSA pneumonia Acute hypoxic  respiratory failure -Currently on room air -Cultures have been negative so far.  On presentation, COVID-19 test was negative -Initially started on cefepime but was switched to IV Zosyn.  Zosyn was discontinued on 03/03/2019 after total of 7 days of antibiotics -Diet as per SLP recommendations  End-stage renal disease on hemodialysis -Nephrology following.  Dialysis as per nephrology schedule. -Dialysis held for today for mild hypotension.  Patient is totally asymptomatic.  Repeat blood pressure stable.  Patient can resume outpatient scheduled hemodialysis on Saturday.  Hyperkalemia -Resolved  Acute metabolic encephalopathy -CT of the head was negative for acute abnormality.  Might be related to pain medications as well -EEG without any epileptiform discharges -Mental status is much improved.  Neurology has signed off.   -Might benefit from outpatient neurology evaluation and follow-up  Dysphagia -Diet as per SLP recommendations.  Anemia of chronic disease -Secondary to renal failure.  Hemoglobin stable.  Chronic pain -Pain meds possibly contributed to encephalopathy? -Outpatient follow-up.    Restart home pain medication regimen at a lower dose.  Generalized deconditioning -PT recommends SNF placement.  Social worker consulted.  Family refused SNF placement.  She will be discharged home with home health PT/OT. - Palliative care following for goals of care discussion.  Currently remains full code.  Patient/family wants to continue to have goals of care discussion with palliative care as an outpatient.   Discharge Instructions  Discharge Instructions    Ambulatory referral to Neurology   Complete by: As directed    An appointment is requested in approximately: few weeks   Diet - low sodium heart healthy   Complete by: As directed    Increase activity slowly   Complete by: As directed      Allergies as of 03/04/2019      Reactions   Iodinated Diagnostic Agents  Other (See Comments)   Seziure Other reaction(s): Other (See Comments) Seziure   Hydrocodone Nausea And Vomiting   Metoprolol Nausea And Vomiting   Iohexol Other (See Comments)   Reaction is convulsions      Medication List    STOP taking these medications   levofloxacin 250 MG tablet Commonly known as: LEVAQUIN     TAKE these medications   Calci-Chew 1250 (500 Ca) MG chewable tablet Generic drug: calcium carbonate Chew 3 tablets by mouth 3 (three) times daily after meals.   diphenhydrAMINE 25 MG tablet Commonly known as: BENADRYL Take 50 mg by mouth daily as needed (cold symptoms).   folic acid-vitamin b complex-vitamin c-selenium-zinc 3 MG Tabs tablet Take 1 tablet by mouth daily.   Ibuprofen 200 MG Caps Take 400 mg by mouth every 6 (six) hours as needed for headache or moderate pain.   mirtazapine 15 MG tablet Commonly known as: REMERON Take 0.5 tablets (7.5 mg total) by mouth at bedtime. What changed: how much to take   oxyCODONE-acetaminophen 10-325 MG tablet Commonly known as: PERCOCET Take 0.5 tablets by mouth 2 (two) times daily as needed. What changed:   how much to take  reasons to take this   polyethylene glycol 17 g packet Commonly known as: MiraLax Take 17 g by mouth daily as needed for moderate constipation.   ProAir HFA 108 (90 Base) MCG/ACT inhaler Generic drug: albuterol Inhale 1 puff into the lungs every 6 (six) hours as needed.   senna 8.6 MG Tabs tablet Commonly known as: SENOKOT Take 1 tablet (8.6 mg total) by mouth 2 (two) times daily as needed for mild constipation.   Tessalon Perles 100 MG capsule Generic drug: benzonatate Take 100 mg by mouth daily as needed for cough.            Durable Medical Equipment  (From admission, onward)         Start     Ordered   03/04/19 1250  For home use only DME Walker rolling  Once    Question:  Patient needs a walker to treat with the following condition  Answer:  Weakness   03/04/19  1251         Follow-up Information    Janie Morning, DO. Schedule an appointment as soon as possible for a visit in 1 week(s).   Specialty: Family Medicine Contact information: [address] [place] [address] [phone]        Palliative care. Schedule an appointment as soon as possible for a visit in 1 week(s).        Outpatient dialysis Follow up.   Why: As scheduled         Allergies  Allergen Reactions  . Iodinated Diagnostic Agents Other (See Comments)    Seziure Other reaction(s): Other (See Comments) Seziure   . Hydrocodone Nausea And Vomiting  . Metoprolol Nausea And Vomiting  . Iohexol Other (See Comments)    Reaction is convulsions    Consultations: Nephrology/neurology/palliative care   Procedures/Studies: EEG  Result Date: 02/26/2019 [name], MD     02/26/2019  2:00 PM Patient Name: ROYANNE WARSHAW MRN: [id number] Epilepsy Attending: [name] Referring Physician/Provider: Dr. Dr. Wonda Amis Date: 02/26/2019 Duration: 24.15 minutes Patient history: 68 year old female with past medical history of seizures who presented with altered mental status.  EEG to evaluate for seizures. Level of alertness: Awake/lethargic AEDs during EEG study: None Technical aspects: This EEG study was done with scalp  electrodes positioned according to the 10-20 International system of electrode placement. Electrical activity was acquired at a sampling rate of _0  and reviewed with a high frequency filter of _1  and a low frequency filter of _2 . EEG data were recorded continuously and digitally stored. Description: EEG showed continuous generalized 2 to 5 Hz theta-delta slowing.  Triphasic waves, generalized, maximal bifrontal, at 2 to 2.5 Hz were also seen.  No clear posterior dominant, seen.  Hyperventilation and photic stimulation were not performed. One episode was noted around 1331 when patient was noted to be staring off.   Concomitant EEG did not show any change from baseline EEG. Abnormality -Continuous slow, generalized -Triphasic waves, generalized, maximal bifrontal IMPRESSION: This study is suggestive of severe diffuse encephalopathy, nonspecific etiology but could be seen in setting of toxic metabolic causes, cefepime toxicity. No seizures or definite epileptiform discharges were seen throughout the recording. One episode of staring was noted at 1331 without any clear EEG changes suggest seizure.  This was most likely a nonepileptic event in the setting of encephalopathy.   CT HEAD WO CONTRAST  Result Date: 02/26/2019 CLINICAL DATA:  Encephalopathy EXAM: CT HEAD WITHOUT CONTRAST TECHNIQUE: Contiguous axial images were obtained from the base of the skull through the vertex without intravenous contrast. COMPARISON:  09/30/2011 FINDINGS: Brain: There is no mass, hemorrhage or extra-axial collection. The size and configuration of the ventricles and extra-axial CSF spaces are normal. There is hypoattenuation of the white matter, most commonly indicating chronic small vessel disease. Old right thalamus small vessel infarct. Vascular: Atherosclerotic calcification of the internal carotid arteries at the skull base. No abnormal hyperdensity of the major intracranial arteries or dural venous sinuses. Skull: The visualized skull base, calvarium and extracranial soft tissues are normal. Sinuses/Orbits: No fluid levels or advanced mucosal thickening of the visualized paranasal sinuses. No mastoid or middle ear effusion. The orbits are normal. IMPRESSION: Chronic small vessel disease and old right thalamus small vessel infarct without acute intracranial abnormality. Electronically Signed   By: Ulyses Jarred M.D.   On: 02/26/2019 01:12   PERIPHERAL VASCULAR CATHETERIZATION  Result Date: 02/05/2019 See Op Note   DG Chest Portable 1 View  Result Date: 02/25/2019 CLINICAL DATA:  Dyspnea.  Altered mental status. EXAM: PORTABLE  CHEST 1 VIEW COMPARISON:  Single-view of the chest 02/23/2019. PA and lateral chest 01/25/2019. FINDINGS: Bibasilar airspace disease seen on the most recent comparison has almost completely resolved. Subtle opacities in the right mid lung zone are unchanged. No pneumothorax or pleural fluid. Heart size is normal. Atherosclerosis and a vascular stent in the right arm are noted. IMPRESSION: Almost complete resolution of bibasilar airspace disease since the most recent study. Subtle opacities in the right mid and lower lung zone could be due to atelectasis or pneumonia and are unchanged. Electronically Signed   By: Inge Rise M.D.   On: 02/25/2019 09:05   DG Chest Portable 1 View  Result Date: 02/23/2019 CLINICAL DATA:  Shortness of breath and fever EXAM: PORTABLE CHEST 1 VIEW COMPARISON:  January 25, 2019 FINDINGS: There are bibasilar airspace opacities, left greater than right. There are likely small bilateral pleural effusions. The lungs are hyperexpanded. The heart size is enlarged. Aortic calcifications are noted. A vascular stent is noted in the right axillary region. There is no acute osseous abnormality. IMPRESSION: 1. Bibasilar airspace opacities, left greater than right, suspicious for pneumonia. 2. Probable small bilateral pleural effusions. 3. Underlying COPD. Electronically Signed   By: Jamie Kato.D.  On: 02/23/2019 19:55   DG Swallowing Func-Speech Pathology  Result Date: 03/03/2019 Objective Swallowing Evaluation: Type of Study: MBS-Modified Barium Swallow Study  Patient Details Name: JOURDEN DELMONT MRN: [id number] Date of Birth: [birth year] Today's Date: 03/03/2019 Time: SLP Start Time (ACUTE ONLY): 1138 -SLP Stop Time (ACUTE ONLY): 1150 SLP Time Calculation (min) (ACUTE ONLY): 12 min Past Medical History: Past Medical History: Diagnosis Date . Anemia   anemia of chronic disease likely 2/2 ESRD per last anemia panel (02/2011) with Fe 35, TIBC 167, ferritin 2041  // BL Hgb 8-10 .  Anuria   Due to dialysis . Aortic stenosis, mild   03/2011 echo . Arthritis   Back . Blood transfusion 1990's  r/t Kidney removal surgery . Brain aneurysm   No records could be found . Colitis, ischemic ([place]) 01/2011  S/P partial colectomy of right hemicolon and ileostomy placement . Diabetes mellitus   Borderline . Dialysis patient [facility])   [place] Tues,Thursday, saturday . Dysrhythmia   SVT for brief period in Feb 2013 . End stage renal disease on dialysis [facility])   Secondary to hypertension // T/Th/Sat dialysis on [employer] . Foot drop, bilateral   after back surgery . Heart murmur   Born with heart mumur, does not require follow up per pt . Hepatitis C  . Hypertension   Does not see a heart doctor, had pre transplant stress test at [facility]   . Lumbar spinal stenosis    bilateral L4-5 and L5-S1 posterior lumbar interbody arthrodesis and bilateral L4-S1 posterior lateral arthrodesis for lumbar stenosis dynamic lumbar spondylolisthesis lumbar spondylosis lumbar radiculopathy with foot drop // s/p surgery 01/2011 - see surgery section for details. . Seizures ([place])   r/t HTN in 1990's x 1 . Umbilical hernia age 14 Past Surgical History: Past Surgical History: Procedure Laterality Date . A/V FISTULAGRAM Right 02/05/2019  Procedure: A/V FISTULAGRAM;  Surgeon: Katha Cabal, MD;  Location: [place] [facility];  Service: Cardiovascular;  Laterality: Right; . APPENDECTOMY  1960's . AV FISTULA PLACEMENT  04/08/2011  Procedure: INSERTION OF ARTERIOVENOUS (AV) GORE-TEX GRAFT ARM;  Surgeon: Rosetta Posner, MD;  Location: [facility] OR;  Service: Vascular;  Laterality: Right;  Revision of Upper Arm Gore-Tex Graft . BACK SURGERY  01/30/2011  L4-S1 lumbar laminectomy facetectomy and foraminotomies for decompression a bilateral L4-5 and L5-S1 posterior lumbar interbody arthrodesis and bilateral L4-S1 posterior lateral arthrodesis for lumbar stenosis dynamic lumbar spondylolisthesis lumbar spondylosis lumbar radiculopathy  with foot drop . COLONOSCOPY  02/19/2011  Procedure: COLONOSCOPY;  Surgeon: Missy Sabins, MD;  Location: [place];  Service: Endoscopy;  Laterality: N/A; . COLONOSCOPY  03/07/2011  Procedure: COLONOSCOPY;  Surgeon: Cleotis Nipper, MD;  Location: [facility] END[facility]COPY;  Service: Endoscopy;  Laterality: N/A;  no prep needed--pt has long Hartmann's pouch and ileostomy . COLOSTOMY  02/09/2011  Reason for surgery: ischemic colitis of right hemicolon requiring partial colectomy and ileostomy.  Surgeon: Harl Bowie, MD;  Location: [place];  Service: General;  Laterality: Right; . ILEOSTOMY CLOSURE  05/29/2011  Procedure: ILEOSTOMY TAKEDOWN;  Surgeon: Harl Bowie, MD;  Location: [place];  Service: General;  Laterality: N/A; . INSERTION OF DIALYSIS CATHETER Right 04/15/2016  Procedure: INSERTION OF DIALYSIS CATHETER RIGHT INTERNAL JUGULAR;  Surgeon: Conrad Marietta, MD;  Location: [place];  Service: Vascular;  Laterality: Right; . LAPAROTOMY  02/09/2011  Procedure: EXPLORATORY LAPAROTOMY;  Surgeon: Harl Bowie, MD;  Location: [place];  Service: General;  Laterality: N/A; . NEPHRECTOMY  2010  right side done at [facility] on transplant list . PARTIAL COLECTOMY  02/09/2011  Reason for surgery: ischemic colitis of right hemicolon; Surgeon: Harl Bowie, MD;  Location: [place];  Service: General;  Laterality: Right; . REVISION OF ARTERIOVENOUS GORETEX GRAFT Right 04/15/2016  Procedure: REVISION OF ARTERIOVENOUS GORETEX GRAFT USING 19mX20cm GORETEX GRAFT;  Surgeon: BConrad [place] MD;  [place]  Service: Vascular;  Laterality: Right; . RIB RESECTION    d/t kidney removal . TONSILLECTOMY    as a child . total parathyroidectomy  01/17/2001  with autotransplantation into left forearm // due to secondary hyperparathyroidism from ESRD . UMBILICAL HERNIA REPAIR  age 89- 21. UPPER EXTREMITY ANGIOGRAPHY Right 07/21/2017  Procedure: UPPER EXTREMITY ANGIOGRAPHY;  Surgeon: CWaynetta Sandy MD;  [facility] [place];  Service: Cardiovascular;  Laterality: Right; .Marland KitchenVASCULAR SURGERY    right arm dialysis graft HPI: EDOLLY HARBACHa 68y.o.femalewith medical history significant ofseizures, HTN; hep C; ESRD on TTS HD; DM; ischemic colitis s/p hemicolectomy and ileostomy; and brain aneurysm presenting with SOB.Per chart pt  [facility]   No data recorded Assessment / Plan / Recommendation CHL IP CLINICAL IMPRESSIONS 03/03/2019 Clinical Impression Pt appeared more lucid compared to yesterday during today's MBS. She demonstrated minimal oropharyngeal dysphagia with decreased aspiration risk. Timing of swallow initiation and laryngeal closure adequate with varying bolus sizes and textures wtihout aspiration or penetration during this study. Slower mastication and transit of regular texture. Minimal vallecular and pyriform sinus residue. She does have prominent cervical bony protrusion and appears to be slightly fused (?) near C 4-5 resulting in slower transit through cervical esophagus and promiment cricopharyngeus. No difficulty with barium pill and thin. Recommend continue Dys, return to thin liquids, straws allowed, pills with thin. Will follow for diet advancement.    SLP Visit Diagnosis Dysphagia, oral phase (R13.11) Attention and concentration deficit following -- Frontal lobe and executive function deficit following -- Impact on safety and function Mild aspiration risk   CHL IP TREATMENT RECOMMENDATION 03/03/2019 Treatment Recommendations Therapy as outlined in treatment plan below   Prognosis 03/03/2019 Prognosis for Safe Diet Advancement Good Barriers to Reach Goals Cognitive deficits Barriers/Prognosis Comment -- CHL IP DIET RECOMMENDATION 03/03/2019 SLP Diet Recommendations Thin liquid;Dysphagia 2 (Fine chop) solids Liquid Administration via Cup;Straw Medication Administration Whole meds with puree Compensations Slow rate;Small sips/bites Postural Changes Seated upright at 90 degrees    CHL IP OTHER RECOMMENDATIONS 03/03/2019 Recommended Consults -- Oral Care Recommendations Oral care BID Other Recommendations --   CHL IP FOLLOW UP RECOMMENDATIONS 03/03/2019 Follow up Recommendations 24 hour supervision/assistance   CHL IP FREQUENCY AND DURATION 03/03/2019 Speech Therapy Frequency (ACUTE ONLY) min 2x/week Treatment Duration 2 weeks      CHL IP ORAL PHASE 03/03/2019 Oral Phase Impaired Oral - Pudding Teaspoon -- Oral - Pudding Cup -- Oral - Honey Teaspoon -- Oral - Honey Cup -- Oral - Nectar Teaspoon -- Oral - Nectar Cup WFL Oral - Nectar Straw -- Oral - Thin Teaspoon -- Oral - Thin Cup WFL Oral - Thin Straw -- Oral - Puree -- Oral - Mech Soft -- Oral - Regular Delayed oral transit Oral - Multi-Consistency -- Oral - Pill -- Oral Phase - Comment --  CHL IP PHARYNGEAL PHASE 03/03/2019 Pharyngeal Phase Impaired Pharyngeal- Pudding Teaspoon -- Pharyngeal -- Pharyngeal- Pudding Cup -- Pharyngeal -- Pharyngeal- Honey Teaspoon -- Pharyngeal -- Pharyngeal- Honey Cup -- Pharyngeal -- Pharyngeal- Nectar  Teaspoon -- Pharyngeal -- Pharyngeal- Nectar Cup WFL Pharyngeal -- Pharyngeal- Nectar Straw -- Pharyngeal -- Pharyngeal- Thin Teaspoon -- Pharyngeal -- Pharyngeal- Thin Cup Pharyngeal residue - valleculae;Pharyngeal residue - pyriform Pharyngeal -- Pharyngeal- Thin Straw Pharyngeal residue - pyriform;Pharyngeal residue - valleculae Pharyngeal -- Pharyngeal- Puree -- Pharyngeal -- Pharyngeal- Mechanical Soft -- Pharyngeal -- Pharyngeal- Regular WFL Pharyngeal -- Pharyngeal- Multi-consistency -- Pharyngeal -- Pharyngeal- Pill -- Pharyngeal -- Pharyngeal Comment --  CHL IP CERVICAL ESOPHAGEAL PHASE 03/03/2019 Cervical Esophageal Phase (No Data) Pudding Teaspoon -- Pudding Cup -- Honey Teaspoon -- Honey Cup -- Nectar Teaspoon -- Nectar Cup -- Nectar Straw -- Thin Teaspoon -- Thin Cup -- Thin Straw -- Puree -- Mechanical Soft -- Regular -- Multi-consistency -- Pill -- Cervical Esophageal Comment -- Houston Siren 03/03/2019, 1:47 PM Orbie Pyo Litaker M.Ed Actor Pager [phone] Office [phone]                EEG This study issuggestive of severe diffuse encephalopathy, nonspecific etiology but could be seen in setting of toxic metabolic causes, cefepime toxicity.No seizures ordefiniteepileptiform discharges were seen throughout the recording.   Subjective: Patient seen and examined at bedside.  Poor historian.  Awake, still slow to respond to questions.  No overnight fever or vomiting reported  Discharge Exam: Vitals:   03/04/19 0739 03/04/19 1226  BP: (!) 87/61 109/69  Pulse: (!) 102 99  Resp: 16 17  Temp:    SpO2: 99% 100%    General exam: No distress.  Looks older than stated age.  Awake, slow to respond.  Poor historian Respiratory system: Bilateral decreased breath sounds at bases with scattered crackles.   Cardiovascular system: Mild tachycardia, S1-S2 heard Gastrointestinal system: Abdomen is nondistended, soft and nontender. Normal bowel sounds heard. Extremities: No cyanosis; trace edema present   The results of significant diagnostics from this hospitalization (including imaging, microbiology, ancillary and laboratory) are listed below for reference.     Microbiology: Recent Results (from the past 240 hour(s))  Novel Coronavirus, NAA ([facility])     Status: None   Collection Time: 02/23/19 12:00 AM   Specimen: Nasopharyngeal(NP) swabs in vial transport medium   NASOPHARYNGE  TESTING  Result Value Ref Range Status   SARS-CoV-2, NAA Not Detected Not Detected Final    Comment: This nucleic acid amplification test was developed and its performance characteristics determined by [employer]. Nucleic acid amplification tests include PCR and TMA. This test has not been FDA cleared or approved. This test has been authorized by FDA under an Emergency Use Authorization (EUA). This test is only authorized for the duration of time the  declaration that circumstances exist justifying the authorization of the emergency use of in vitro diagnostic tests for detection of SARS-CoV-2 virus and/or diagnosis of COVID-19 infection under section 564(b)(1) of the Act, 21 U.S.C. 893YBO-1(B) (1), unless the authorization is terminated or revoked sooner. When diagnostic testing is negative, the possibility of a false negative result should be considered in the context of a patient's recent exposures and the presence of clinical signs and symptoms consistent with COVID-19. An individual without symptoms of COVID-19 and who is not shedding SARS-CoV-2 virus would  expect to have a negative (not detected) result in this assay.   SARS Coronavirus 2 Ag (30 min TAT) - Nasal Swab (BD Veritor Kit)     Status: None   Collection Time: 02/23/19  7:21 PM   Specimen: Nasal Swab (BD Veritor Kit)  Result Value Ref Range Status  SARS Coronavirus 2 Ag NEGATIVE NEGATIVE Final    Comment: (NOTE) SARS-CoV-2 antigen NOT DETECTED.  Negative results are presumptive.  Negative results do not preclude SARS-CoV-2 infection and should not be used as the sole basis for treatment or other patient management decisions, including infection  control decisions, particularly in the presence of clinical signs and  symptoms consistent with COVID-19, or in those who have been in contact with the virus.  Negative results must be combined with clinical observations, patient history, and epidemiological information. The expected result is Negative. Fact Sheet for Patients: PodPark.tn Fact Sheet for Healthcare Providers: GiftContent.is This test is not yet approved or cleared by the [place] FDA and  has been authorized for detection and/or diagnosis of SARS-CoV-2 by FDA under an Emergency Use Authorization (EUA).  This EUA will remain in effect (meaning this test can be used) for the duration of  the  COVID-19 de claration under Section 564(b)(1) of the Act, 21 U.S.C. section 360bbb-3(b)(1), unless the authorization is terminated or revoked sooner. Performed at [facility], [address] [place], [place] [address]   Culture, blood (Routine X 2) w Reflex to ID Panel     Status: None   Collection Time: 02/23/19  7:25 PM   Specimen: BLOOD  Result Value Ref Range Status   Specimen Description   Final    BLOOD BLOOD LEFT WRIST Performed at [facility], [address] [place], [place] [address]    Special Requests   Final    BOTTLES DRAWN AEROBIC AND ANAEROBIC Blood Culture adequate volume Performed at [facility], [address] [place], [place] [address]    Culture   Final    NO GROWTH 5 DAYS Performed at [facility], [place] [address] [place], [place] [address]    Report Status 03/01/2019 FINAL  Final  Culture, blood (Routine X 2) w Reflex to ID Panel     Status: None   Collection Time: 02/23/19  7:30 PM   Specimen: BLOOD  Result Value Ref Range Status   Specimen Description   Final    BLOOD LEFT ANTECUBITAL Performed at [facility], [address] [place], [place] [address]    Special Requests   Final    BOTTLES DRAWN AEROBIC AND ANAEROBIC Blood Culture adequate volume Performed at [facility], [address] [place], [place] [address]    Culture   Final    NO GROWTH 5 DAYS Performed at [facility], [place] [address], [place], [place] [address]    Report Status 03/01/2019 FINAL  Final  SARS CORONAVIRUS 2 (TAT 6-24 HRS) Nasopharyngeal Nasopharyngeal Swab     Status: None   Collection Time: 02/23/19 10:06 PM   Specimen: Nasopharyngeal Swab  Result Value Ref Range Status   SARS Coronavirus 2 NEGATIVE NEGATIVE Final    Comment: (NOTE) SARS-CoV-2 target nucleic acids are NOT DETECTED. The SARS-CoV-2 RNA is generally detectable in upper and lower respiratory specimens during the acute phase of  infection. Negative results do not preclude SARS-CoV-2 infection, do not rule out co-infections with other pathogens, and should not be used as the sole basis for treatment or other patient management decisions. Negative results must be combined with clinical observations, patient history, and epidemiological information. The expected result is Negative. Fact Sheet for Patients: SugarRoll.be Fact Sheet for Healthcare Providers: https://www.woods-mathews.com/ This test is not yet approved or cleared by the  [place] [employer]ural [employer] and  has been authorized for detection and/or diagnosis of SARS-CoV-2 by FDA under an Print production planner (EUA). This EUA will remain  in effect (meaning this test can be used) for the duration of the COVID-19 declaration under Section 56 4(b)(1) of the Act, 21 U.S.C. section 360bbb-3(b)(1), unless the authorization is terminated or revoked sooner. Performed at [facility], [place] [address], [place], [place] [address]   Respiratory Panel by RT PCR (Flu A&B, Covid) - Nasopharyngeal Swab     Status: None   Collection Time: 02/25/19 11:01 AM   Specimen: Nasopharyngeal Swab  Result Value Ref Range Status   SARS Coronavirus 2 by RT PCR NEGATIVE NEGATIVE Final    Comment: (NOTE) SARS-CoV-2 target nucleic acids are NOT DETECTED. The SARS-CoV-2 RNA is generally detectable in upper respiratoy specimens during the acute phase of infection. The lowest concentration of SARS-CoV-2 viral copies this assay can detect is 131 copies/mL. A negative result does not preclude SARS-Cov-2 infection and should not be used as the sole basis for treatment or other patient management decisions. A negative result may occur with  improper specimen collection/handling, submission of specimen other than nasopharyngeal swab, presence of viral mutation(s) within the areas targeted by this assay, and inadequate number of viral copies (<131  copies/mL). A negative result must be combined with clinical observations, patient history, and epidemiological information. The expected result is Negative. Fact Sheet for Patients:  PinkCheek.be Fact Sheet for Healthcare Providers:  GravelBags.it This test is not yet ap proved or cleared by the [place] FDA and  has been authorized for detection and/or diagnosis of SARS-CoV-2 by FDA under an Emergency Use Authorization (EUA). This EUA will remain  in effect (meaning this test can be used) for the duration of the COVID-19 declaration under Section 564(b)(1) of the Act, 21 U.S.C. section 360bbb-3(b)(1), unless the authorization is terminated or revoked sooner.    Influenza A by PCR NEGATIVE NEGATIVE Final   Influenza B by PCR NEGATIVE NEGATIVE Final    Comment: (NOTE) The Xpert Xpress SARS-CoV-2/FLU/RSV assay is intended as an aid in  the diagnosis of influenza from Nasopharyngeal swab specimens and  should not be used as a sole basis for treatment. Nasal washings and  aspirates are unacceptable for Xpert Xpress SARS-CoV-2/FLU/RSV  testing. Fact Sheet for Patients: PinkCheek.be Fact Sheet for Healthcare Providers: GravelBags.it This test is not yet approved or cleared by the [place] FDA and  has been authorized for detection and/or diagnosis of SARS-CoV-2 by  FDA under an Emergency Use Authorization (EUA). This EUA will remain  in effect (meaning this test can be used) for the duration of the  Covid-19 declaration under Section 564(b)(1) of the Act, 21  U.S.C. section 360bbb-3(b)(1), unless the authorization is  terminated or revoked. Performed at [facility], [place] [address], [place], [place] [address]   Culture, blood (routine x 2) Call MD if unable to obtain prior to antibiotics being given     Status: None   Collection Time: 02/25/19  1:00 PM    Specimen: BLOOD  Result Value Ref Range Status   Specimen Description BLOOD LEFT ANTECUBITAL  Final   Special Requests   Final    BOTTLES DRAWN AEROBIC AND ANAEROBIC Blood Culture adequate volume   Culture   Final    NO GROWTH 5 DAYS Performed at [facility], [place] [address], [place], [place] [address]    Report Status 03/02/2019 FINAL  Final  Culture, blood (routine x 2) Call MD if unable to obtain prior to antibiotics being given     Status: None   Collection Time: 02/25/19  1:16 PM   Specimen: BLOOD  Result Value Ref Range Status   Specimen Description BLOOD BLOOD LEFT FOREARM  Final   Special Requests   Final    BOTTLES DRAWN AEROBIC AND ANAEROBIC Blood Culture results may not be optimal due to an inadequate volume of blood received in culture bottles   Culture   Final    NO GROWTH 5 DAYS Performed at [facility], [place] [address], [place], [place] [address]    Report Status 03/02/2019 FINAL  Final  MRSA PCR Screening     Status: None   Collection Time: 02/26/19  2:08 AM   Specimen: Nasopharyngeal  Result Value Ref Range Status   MRSA by PCR NEGATIVE NEGATIVE Final    Comment:        The GeneXpert MRSA Assay (FDA approved for NASAL specimens only), is one component of a comprehensive MRSA colonization surveillance program. It is not intended to diagnose MRSA infection nor to guide or monitor treatment for MRSA infections. Performed at [facility], [place] [address] [place], [place] [address]      Labs: BNP (last 3 results) Recent Labs    02/23/19 1921  BNP 858.8*   Basic Metabolic Panel: Recent Labs  Lab 02/27/19 0333 03/01/19 0839 03/02/19 0224 03/03/19 0242 03/04/19 0306  NA 135 144 141 137 137  K 4.5 4.4 3.9 3.8 3.5  CL 92* 99 97* 93* 93*  CO2 _0 GLUCOSE 101* 93 123* 94 103*  BUN 32* 40* 47* 21 36*  CREATININE 7.63* 7.55* 8.79* 4.75* 6.77*  CALCIUM 9.0 8.4* 8.2* 8.4* 7.7*  MG 2.1  --   --   --  2.1  PHOS 4.6 6.8*  6.5* 5.3* 5.4*   Liver Function Tests: Recent Labs  Lab 02/27/19 0333 03/01/19 0839 03/02/19 0224 03/03/19 0242 03/04/19 0306  AST 13*  --   --   --   --   ALT 10  --   --   --   --   ALKPHOS 74  --   --   --   --   BILITOT 1.1  --   --   --   --   PROT 7.6  --   --   --   --   ALBUMIN 2.8* 2.9* 2.9* 2.7* 2.6*   No results for input(s): LIPASE, AMYLASE in the last 168 hours. Recent Labs  Lab 02/26/19 1056  AMMONIA 13   CBC: Recent Labs  Lab 02/26/19 0318 02/27/19 0333 03/01/19 0839 03/02/19 0224 03/04/19 0306  WBC 13.7* 8.4 13.4* 11.2* 11.6*  NEUTROABS 10.6*  --  9.4* 7.3 7.6  HGB 9.9* 10.3* 10.4* 10.5* 9.8*  HCT 28.6* 30.1* 31.3* 31.0* 29.3*  MCV 79.4* 80.7 81.5 80.5 80.5  PLT 398 425* 437* 415* 366   Cardiac Enzymes: No results for input(s): CKTOTAL, CKMB, CKMBINDEX, TROPONINI in the last 168 hours. BNP: Invalid input(s): POCBNP CBG: Recent Labs  Lab 03/03/19 0949 03/03/19 1216 03/03/19 1409 03/03/19 1624 03/03/19 2013  GLUCAP 89 117* 92 114* 154*   D-Dimer No results for input(s): DDIMER in the last 72 hours. Hgb A1c No results for input(s): HGBA1C in the last 72 hours. Lipid Profile No results for input(s): CHOL, HDL, LDLCALC, TRIG, CHOLHDL, LDLDIRECT in the last 72 hours.  Thyroid function studies Recent Labs    03/02/19 1423  TSH 2.466   Anemia work up No results for input(s): VITAMINB12, FOLATE, FERRITIN, TIBC, IRON, RETICCTPCT in the last 72 hours. Urinalysis No results found for: COLORURINE, APPEARANCEUR, [place], [place], [place], [place], [place], [place], PROTEINUR, UROBILINOGEN, NITRITE, LEUKOCYTESUR Sepsis Labs Invalid input(s): PROCALCITONIN,  WBC,  LACTICIDVEN Microbiology Recent Results (from the past 240 hour(s))  Novel Coronavirus, NAA ([facility])     Status: None   Collection Time: 02/23/19 12:00 AM   Specimen: Nasopharyngeal(NP) swabs in vial transport medium   NASOPHARYNGE  TESTING  Result Value Ref Range Status    SARS-CoV-2, NAA Not Detected Not Detected Final    Comment: This nucleic acid amplification test was developed and its performance characteristics determined by [employer] Dickinson and Company. Nucleic acid amplification tests include PCR and TMA. This test has not been FDA cleared or approved. This test has been authorized by FDA under an Emergency Use Authorization (EUA). This test is only authorized for the duration of time the declaration that circumstances exist justifying the authorization of the emergency use of in vitro diagnostic tests for detection of SARS-CoV-2 virus and/or diagnosis of COVID-19 infection under section 564(b)(1) of the Act, 21 U.S.C. 235TDD-2(K) (1), unless the authorization is terminated or revoked sooner. When diagnostic testing is negative, the possibility of a false negative result should be considered in the context of a patient's recent exposures and the presence of clinical signs and symptoms consistent with COVID-19. An individual without symptoms of COVID-19 and who is not shedding SARS-CoV-2 virus would  expect to have a negative (not detected) result in this assay.   SARS Coronavirus 2 Ag (30 min TAT) - Nasal Swab (BD Veritor Kit)     Status: None   Collection Time: 02/23/19  7:21 PM   Specimen: Nasal Swab (BD Veritor Kit)  Result Value Ref Range Status   SARS Coronavirus 2 Ag NEGATIVE NEGATIVE Final    Comment: (NOTE) SARS-CoV-2 antigen NOT DETECTED.  Negative results are presumptive.  Negative results do not preclude SARS-CoV-2 infection and should not be used as the sole basis for treatment or other patient management decisions, including infection  control decisions, particularly in the presence of clinical signs and  symptoms consistent with COVID-19, or in those who have been in contact with the virus.  Negative results must be combined with clinical observations, patient history, and epidemiological information. The expected result is  Negative. Fact Sheet for Patients: PodPark.tn Fact Sheet for Healthcare Providers: GiftContent.is This test is not yet approved or cleared by the [place] FDA and  has been authorized for detection and/or diagnosis of SARS-CoV-2 by FDA under an Emergency Use Authorization (EUA).  This EUA will remain in effect (meaning this test can be used) for the duration of  the COVID-19 de claration under Section 564(b)(1) of the Act, 21 U.S.C. section 360bbb-3(b)(1), unless the authorization is terminated or revoked sooner. Performed at [facility], [address] [place], [place] [address]   Culture, blood (Routine X 2) w Reflex to ID Panel     Status: None   Collection Time: 02/23/19  7:25 PM   Specimen: BLOOD  Result Value Ref Range Status   Specimen Description   Final    BLOOD BLOOD LEFT WRIST Performed at [facility], [address] [place], [place] [address]    Special Requests   Final    BOTTLES DRAWN AEROBIC AND ANAEROBIC Blood Culture adequate volume Performed  at [facility], [address] [place], [place] [address]    Culture   Final    NO GROWTH 5 DAYS Performed at [facility], [place] [address] [place], [place] [address]    Report Status 03/01/2019 FINAL  Final  Culture, blood (Routine X 2) w Reflex to ID Panel     Status: None   Collection Time: 02/23/19  7:30 PM   Specimen: BLOOD  Result Value Ref Range Status   Specimen Description   Final    BLOOD LEFT ANTECUBITAL Performed at [facility], [place] [place], [place] [address]    Special Requests   Final    BOTTLES DRAWN AEROBIC AND ANAEROBIC Blood Culture adequate volume Performed at [facility], [address], [place], [place] [address]    Culture   Final    NO GROWTH 5 DAYS Performed at [facility], [place] [address], [place], [place] [address]    Report Status 03/01/2019  FINAL  Final  SARS CORONAVIRUS 2 (TAT 6-24 HRS) Nasopharyngeal Nasopharyngeal Swab     Status: None   Collection Time: 02/23/19 10:06 PM   Specimen: Nasopharyngeal Swab  Result Value Ref Range Status   SARS Coronavirus 2 NEGATIVE NEGATIVE Final    Comment: (NOTE) SARS-CoV-2 target nucleic acids are NOT DETECTED. The SARS-CoV-2 RNA is generally detectable in upper and lower respiratory specimens during the acute phase of infection. Negative results do not preclude SARS-CoV-2 infection, do not rule out co-infections with other pathogens, and should not be used as the sole basis for treatment or other patient management decisions. Negative results must be combined with clinical observations, patient history, and epidemiological information. The expected result is Negative. Fact Sheet for Patients: SugarRoll.be Fact Sheet for Healthcare Providers: https://www.woods-mathews.com/ This test is not yet approved or cleared by the [place] FDA and  has been authorized for detection and/or diagnosis of SARS-CoV-2 by FDA under an Emergency Use Authorization (EUA). This EUA will remain  in effect (meaning this test can be used) for the duration of the COVID-19 declaration under Section 56 4(b)(1) of the Act, 21 U.S.C. section 360bbb-3(b)(1), unless the authorization is terminated or revoked sooner. Performed at [facility], [place] [address] [place],  [address]   Respiratory Panel by RT PCR (Flu A&B, Covid) - Nasopharyngeal Swab     Status: None   Collection Time: 02/25/19 11:01 AM   Specimen: Nasopharyngeal Swab  Result Value Ref Range Status   SARS Coronavirus 2 by RT PCR NEGATIVE NEGATIVE Final    Comment: (NOTE) SARS-CoV-2 target nucleic acids are NOT DETECTED. The SARS-CoV-2 RNA is generally detectable in upper respiratoy specimens during the acute phase of infection. The lowest concentration of SARS-CoV-2 viral copies this assay  can detect is 131 copies/mL. A negative result does not preclude SARS-Cov-2 infection and should not be used as the sole basis for treatment or other patient management decisions. A negative result may occur with  improper specimen collection/handling, submission of specimen other than nasopharyngeal swab, presence of viral mutation(s) within the areas targeted by this assay, and inadequate number of viral copies (<131 copies/mL). A negative result must be combined with clinical observations, patient history, and epidemiological information. The expected result is Negative. Fact Sheet for Patients:  PinkCheek.be Fact Sheet for Healthcare Providers:  GravelBags.it This test is not yet ap proved or cleared by the [place] and  has been authorized  for detection and/or diagnosis of SARS-CoV-2 by FDA under an Emergency Use Authorization (EUA). This EUA will remain  in effect (meaning this test can be used) for the duration of the COVID-19 declaration under Section 564(b)(1) of the Act, 21 U.S.C. section 360bbb-3(b)(1), unless the authorization is terminated or revoked sooner.    Influenza A by PCR NEGATIVE NEGATIVE Final   Influenza B by PCR NEGATIVE NEGATIVE Final    Comment: (NOTE) The Xpert Xpress SARS-CoV-2/FLU/RSV assay is intended as an aid in  the diagnosis of influenza from Nasopharyngeal swab specimens and  should not be used as a sole basis for treatment. Nasal washings and  aspirates are unacceptable for Xpert Xpress SARS-CoV-2/FLU/RSV  testing. Fact Sheet for Patients: PinkCheek.be Fact Sheet for Healthcare Providers: GravelBags.it This test is not yet approved or cleared by the [place] FDA and  has been authorized for detection and/or diagnosis of SARS-CoV-2 by  FDA under an Emergency Use Authorization (EUA). This EUA will remain  in effect  (meaning this test can be used) for the duration of the  Covid-19 declaration under Section 564(b)(1) of the Act, 21  U.S.C. section 360bbb-3(b)(1), unless the authorization is  terminated or revoked. Performed at [facility], [place] [address], [place], [place] [address]   Culture, blood (routine x 2) Call MD if unable to obtain prior to antibiotics being given     Status: None   Collection Time: 02/25/19  1:00 PM   Specimen: BLOOD  Result Value Ref Range Status   Specimen Description BLOOD LEFT ANTECUBITAL  Final   Special Requests   Final    BOTTLES DRAWN AEROBIC AND ANAEROBIC Blood Culture adequate volume   Culture   Final    NO GROWTH 5 DAYS Performed at [facility], [place] [address] [place], [place] [address]    Report Status 03/02/2019 FINAL  Final  Culture, blood (routine x 2) Call MD if unable to obtain prior to antibiotics being given     Status: None   Collection Time: 02/25/19  1:16 PM   Specimen: BLOOD  Result Value Ref Range Status   Specimen Description BLOOD BLOOD LEFT FOREARM  Final   Special Requests   Final    BOTTLES DRAWN AEROBIC AND ANAEROBIC Blood Culture results may not be optimal due to an inadequate volume of blood received in culture bottles   Culture   Final    NO GROWTH 5 DAYS Performed at [facility], [place] [address], [place], [place] [address]    Report Status 03/02/2019 FINAL  Final  MRSA PCR Screening     Status: None   Collection Time: 02/26/19  2:08 AM   Specimen: Nasopharyngeal  Result Value Ref Range Status   MRSA by PCR NEGATIVE NEGATIVE Final    Comment:        The GeneXpert MRSA Assay (FDA approved for NASAL specimens only), is one component of a comprehensive MRSA colonization surveillance program. It is not intended to diagnose MRSA infection nor to guide or monitor treatment for MRSA infections. Performed at [facility], [place] [address], [place], [place] [address]      Time coordinating discharge: 35  minutes  SIGNED:   Aline August, MD  [facility] 03/04/2019, 12:51 PM

## 2019-03-04 NOTE — Progress Notes (Signed)
Patient ID: [name], female   DOB: 1950-05-18, 68 y.o.   MRN: [id number]  PROGRESS NOTE    MING KUNKA  MIW:[id number] DOB: Apr 30, 1950 DOA: 02/25/2019 PCP: Janie Morning, DO   Brief Narrative:  68 year old female with history of seizure, hypertension, hepatitis C, end-stage renal disease on hemodialysis, diabetes mellitus type 2, ischemic colitis status post hemicolectomy and ileostomy, brain aneurysm presented with shortness of breath on 02/25/2019.  On presentation, she was getting increasingly confused as well.  She was admitted for respiratory failure secondary to pneumonia and started on antibiotics.  Neurology was consulted for encephalopathy.  Head CT was negative.  EEG was negative for seizures.  Neurology signed off.  Assessment & Plan:   Healthcare associated pneumonia with concern for gram-negative pneumonia versus MRSA pneumonia Acute hypoxic respiratory failure -Currently on room air -Cultures have been negative so far.  On presentation, COVID-19 test was negative -Initially started on cefepime but was switched to IV Zosyn.  Zosyn was discontinued on 03/03/2019 after total of 7 days of antibiotics -Diet as per SLP recommendations  End-stage renal disease on hemodialysis -Nephrology following.  Dialysis as per nephrology schedule.  Hyperkalemia -Resolved  Acute metabolic encephalopathy -CT of the head was negative for acute abnormality.  Might be related to pain medications as well -EEG without any epileptiform discharges -Mental status is much improved.  Neurology has signed off.  Monitor mental status  Dysphagia -SLP following  Anemia of chronic disease -Secondary to renal failure.  Hemoglobin stable.  Chronic pain -Pain meds possibly contributed to encephalopathy? -Continue lidocaine patch.  Outpatient follow-up.  Might need to restart pain meds at a lower dose  Generalized deconditioning -PT recommends SNF placement.  Social worker consulted. -  Palliative care following for goals of care discussion.  Currently remains full code.    DVT prophylaxis: Heparin Code Status: Full Family Communication: Spoke to daughter/[name] on phone on 03/03/2019 Disposition Plan: SNF once bed is available  Consultants: Nephrology/neurology/palliative care  Procedures:  EEG This study is suggestive of severe diffuse encephalopathy, nonspecific etiology but could be seen in setting of toxic metabolic causes, cefepime toxicity. No seizures or definite epileptiform discharges were seen throughout the recording.  Antimicrobials:  Anti-infectives (From admission, onward)   Start     Dose/Rate Route Frequency Ordered Stop   02/27/19 1400  piperacillin-tazobactam (ZOSYN) IVPB 2.25 g  Status:  Discontinued     2.25 g 100 mL/hr over 30 Minutes Intravenous Every 8 hours 02/27/19 1235 03/04/19 1043   02/27/19 1200  vancomycin (VANCOCIN) IVPB 500 mg/100 ml premix  Status:  Discontinued     500 mg 100 mL/hr over 60 Minutes Intravenous Every T-Th-Sa (Hemodialysis) 02/26/19 1102 02/27/19 1235   02/26/19 1200  ceFEPIme (MAXIPIME) 1 g in sodium chloride 0.9 % 100 mL IVPB  Status:  Discontinued     1 g 200 mL/hr over 30 Minutes Intravenous Every 24 hours 02/25/19 1127 02/27/19 1140   02/26/19 0000  vancomycin variable dose per unstable renal function (pharmacist dosing)  Status:  Discontinued      Does not apply See admin instructions 02/25/19 1141 02/26/19 1102   02/25/19 1130  ceFEPIme (MAXIPIME) 2 g in sodium chloride 0.9 % 100 mL IVPB     2 g 200 mL/hr over 30 Minutes Intravenous  Once 02/25/19 1126 02/25/19 1225   02/25/19 1130  vancomycin (VANCOCIN) 1,250 mg in sodium chloride 0.9 % 250 mL IVPB     1,250 mg 166.7 mL/hr over 90 Minutes  Intravenous  Once 02/25/19 1127 02/25/19 1421       Subjective: Patient seen and examined at bedside.  Poor historian.  Awake, still slow to respond to questions.  No overnight fever or vomiting  reported.  Objective: Vitals:   03/03/19 0743 03/03/19 1620 03/03/19 2251 03/04/19 0739  BP: 101/64 103/83 110/67 (!) 87/61  Pulse: (!) 109 (!) 116 (!) 113 (!) 102  Resp: _0 Temp: 98.8 F (37.1 C) 98.9 F (37.2 C) 98.2 F (36.8 C)   TempSrc: Oral Oral  Oral  SpO2: 100% 91% 98% 99%  Weight:      Height:        Intake/Output Summary (Last 24 hours) at 03/04/2019 1058 Last data filed at 03/04/2019 0957 Gross per 24 hour  Intake 240 ml  Output --  Net 240 ml   Filed Weights   02/27/19 2241 03/02/19 0730 03/02/19 1109  Weight: 43.7 kg 41.3 kg 41.8 kg    Examination:  General exam: No distress.  Looks older than stated age.  Awake, slow to respond.  Poor historian Respiratory system: Bilateral decreased breath sounds at bases with scattered crackles.   Cardiovascular system: Mild tachycardia, S1-S2 heard Gastrointestinal system: Abdomen is nondistended, soft and nontender. Normal bowel sounds heard. Extremities: No cyanosis; trace edema present  Data Reviewed: I have personally reviewed following labs and imaging studies  CBC: Recent Labs  Lab 02/26/19 0318 02/27/19 0333 03/01/19 0839 03/02/19 0224 03/04/19 0306  WBC 13.7* 8.4 13.4* 11.2* 11.6*  NEUTROABS 10.6*  --  9.4* 7.3 7.6  HGB 9.9* 10.3* 10.4* 10.5* 9.8*  HCT 28.6* 30.1* 31.3* 31.0* 29.3*  MCV 79.4* 80.7 81.5 80.5 80.5  PLT 398 425* 437* 415* 494   Basic Metabolic Panel: Recent Labs  Lab 02/27/19 0333 03/01/19 0839 03/02/19 0224 03/03/19 0242 03/04/19 0306  NA 135 144 141 137 137  K 4.5 4.4 3.9 3.8 3.5  CL 92* 99 97* 93* 93*  CO2 _1 GLUCOSE 101* 93 123* 94 103*  BUN 32* 40* 47* 21 36*  CREATININE 7.63* 7.55* 8.79* 4.75* 6.77*  CALCIUM 9.0 8.4* 8.2* 8.4* 7.7*  MG 2.1  --   --   --  2.1  PHOS 4.6 6.8* 6.5* 5.3* 5.4*   GFR: Estimated Creatinine Clearance: 5.2 mL/min (A) (by C-G formula based on SCr of 6.77 mg/dL (H)). Liver Function Tests: Recent Labs  Lab  02/27/19 0333 03/01/19 0839 03/02/19 0224 03/03/19 0242 03/04/19 0306  AST 13*  --   --   --   --   ALT 10  --   --   --   --   ALKPHOS 74  --   --   --   --   BILITOT 1.1  --   --   --   --   PROT 7.6  --   --   --   --   ALBUMIN 2.8* 2.9* 2.9* 2.7* 2.6*   No results for input(s): LIPASE, AMYLASE in the last 168 hours. Recent Labs  Lab 02/26/19 1056  AMMONIA 13   Coagulation Profile: No results for input(s): INR, PROTIME in the last 168 hours. Cardiac Enzymes: No results for input(s): CKTOTAL, CKMB, CKMBINDEX, TROPONINI in the last 168 hours. BNP (last 3 results) No results for input(s): PROBNP in the last 8760 hours. HbA1C: No results for input(s): HGBA1C in the last 72 hours. CBG: Recent Labs  Lab 03/03/19 0949 03/03/19 1216 03/03/19 1409  03/03/19 1624 03/03/19 2013  GLUCAP 89 117* 92 114* 154*   Lipid Profile: No results for input(s): CHOL, HDL, LDLCALC, TRIG, CHOLHDL, LDLDIRECT in the last 72 hours. Thyroid Function Tests: Recent Labs    03/02/19 1423  TSH 2.466   Anemia Panel: No results for input(s): VITAMINB12, FOLATE, FERRITIN, TIBC, IRON, RETICCTPCT in the last 72 hours. Sepsis Labs: No results for input(s): PROCALCITON, LATICACIDVEN in the last 168 hours.  Recent Results (from the past 240 hour(s))  Novel Coronavirus, NAA ([facility])     Status: None   Collection Time: 02/23/19 12:00 AM   Specimen: Nasopharyngeal(NP) swabs in vial transport medium   NASOPHARYNGE  TESTING  Result Value Ref Range Status   SARS-CoV-2, NAA Not Detected Not Detected Final    Comment: This nucleic acid amplification test was developed and its performance characteristics determined by [employer]. Nucleic acid amplification tests include PCR and TMA. This test has not been FDA cleared or approved. This test has been authorized by FDA under an Emergency Use Authorization (EUA). This test is only authorized for the duration of time the declaration that  circumstances exist justifying the authorization of the emergency use of in vitro diagnostic tests for detection of SARS-CoV-2 virus and/or diagnosis of COVID-19 infection under section 564(b)(1) of the Act, 21 U.S.C. 509TOI-7(T) (1), unless the authorization is terminated or revoked sooner. When diagnostic testing is negative, the possibility of a false negative result should be considered in the context of a patient's recent exposures and the presence of clinical signs and symptoms consistent with COVID-19. An individual without symptoms of COVID-19 and who is not shedding SARS-CoV-2 virus would  expect to have a negative (not detected) result in this assay.   SARS Coronavirus 2 Ag (30 min TAT) - Nasal Swab (BD Veritor Kit)     Status: None   Collection Time: 02/23/19  7:21 PM   Specimen: Nasal Swab (BD Veritor Kit)  Result Value Ref Range Status   SARS Coronavirus 2 Ag NEGATIVE NEGATIVE Final    Comment: (NOTE) SARS-CoV-2 antigen NOT DETECTED.  Negative results are presumptive.  Negative results do not preclude SARS-CoV-2 infection and should not be used as the sole basis for treatment or other patient management decisions, including infection  control decisions, particularly in the presence of clinical signs and  symptoms consistent with COVID-19, or in those who have been in contact with the virus.  Negative results must be combined with clinical observations, patient history, and epidemiological information. The expected result is Negative. Fact Sheet for Patients: PodPark.tn Fact Sheet for Healthcare Providers: GiftContent.is This test is not yet approved or cleared by the [place] FDA and  has been authorized for detection and/or diagnosis of SARS-CoV-2 by FDA under an Emergency Use Authorization (EUA).  This EUA will remain in effect (meaning this test can be used) for the duration of  the COVID-19 de claration  under Section 564(b)(1) of the Act, 21 U.S.C. section 360bbb-3(b)(1), unless the authorization is terminated or revoked sooner. Performed at [facility], [address] [place], [place] [address]   Culture, blood (Routine X 2) w Reflex to ID Panel     Status: None   Collection Time: 02/23/19  7:25 PM   Specimen: BLOOD  Result Value Ref Range Status   Specimen Description   Final    BLOOD BLOOD LEFT WRIST Performed at [facility], [address], [place], [place] [address]    Special Requests  Final    BOTTLES DRAWN AEROBIC AND ANAEROBIC Blood Culture adequate volume Performed at [facility], [address] [place], [place] [address]    Culture   Final    NO GROWTH 5 DAYS Performed at [facility], [place] [address] [place], [place] [address]    Report Status 03/01/2019 FINAL  Final  Culture, blood (Routine X 2) w Reflex to ID Panel     Status: None   Collection Time: 02/23/19  7:30 PM   Specimen: BLOOD  Result Value Ref Range Status   Specimen Description   Final    BLOOD LEFT ANTECUBITAL Performed at [facility] [address] [place], [place] [address]    Special Requests   Final    BOTTLES DRAWN AEROBIC AND ANAEROBIC Blood Culture adequate volume Performed at [facility], [address] [place], [place] [address]    Culture   Final    NO GROWTH 5 DAYS Performed at [facility], [place] [address] [place], [place] [address]    Report Status 03/01/2019 FINAL  Final  SARS CORONAVIRUS 2 (TAT 6-24 HRS) Nasopharyngeal Nasopharyngeal Swab     Status: None   Collection Time: 02/23/19 10:06 PM   Specimen: Nasopharyngeal Swab  Result Value Ref Range Status   SARS Coronavirus 2 NEGATIVE NEGATIVE Final    Comment: (NOTE) SARS-CoV-2 target nucleic acids are NOT DETECTED. The SARS-CoV-2 RNA is generally detectable in upper and lower respiratory specimens during the acute phase of infection. Negative results  do not preclude SARS-CoV-2 infection, do not rule out co-infections with other pathogens, and should not be used as the sole basis for treatment or other patient management decisions. Negative results must be combined with clinical observations, patient history, and epidemiological information. The expected result is Negative. Fact Sheet for Patients: SugarRoll.be Fact Sheet for Healthcare Providers: https://www.woods-mathews.com/ This test is not yet approved or cleared by the [place] FDA and  has been authorized for detection and/or diagnosis of SARS-CoV-2 by FDA under an Emergency Use Authorization (EUA). This EUA will remain  in effect (meaning this test can be used) for the duration of the COVID-19 declaration under Section 56 4(b)(1) of the Act, 21 U.S.C. section 360bbb-3(b)(1), unless the authorization is terminated or revoked sooner. Performed at [facility], [place] [address] [place], [place] [address]   Respiratory Panel by RT PCR (Flu A&B, Covid) - Nasopharyngeal Swab     Status: None   Collection Time: 02/25/19 11:01 AM   Specimen: Nasopharyngeal Swab  Result Value Ref Range Status   SARS Coronavirus 2 by RT PCR NEGATIVE NEGATIVE Final    Comment: (NOTE) SARS-CoV-2 target nucleic acids are NOT DETECTED. The SARS-CoV-2 RNA is generally detectable in upper respiratoy specimens during the acute phase of infection. The lowest concentration of SARS-CoV-2 viral copies this assay can detect is 131 copies/mL. A negative result does not preclude SARS-Cov-2 infection and should not be used as the sole basis for treatment or other patient management decisions. A negative result may occur with  improper specimen collection/handling, submission of specimen other than nasopharyngeal swab, presence of viral mutation(s) within the areas targeted by this assay, and inadequate number of viral copies (<131 copies/mL). A negative result  must be combined with clinical observations, patient history, and epidemiological information. The expected result is Negative. Fact Sheet for Patients:  PinkCheek.be Fact Sheet for Healthcare Providers:  GravelBags.it This test is not yet  ap proved or cleared by the [place] and  has been authorized for detection and/or diagnosis of SARS-CoV-2 by FDA under an Emergency Use Authorization (EUA). This EUA will remain  in effect (meaning this test can be used) for the duration of the COVID-19 declaration under Section 564(b)(1) of the Act, 21 U.S.C. section 360bbb-3(b)(1), unless the authorization is terminated or revoked sooner.    Influenza A by PCR NEGATIVE NEGATIVE Final   Influenza B by PCR NEGATIVE NEGATIVE Final    Comment: (NOTE) The Xpert Xpress SARS-CoV-2/FLU/RSV assay is intended as an aid in  the diagnosis of influenza from Nasopharyngeal swab specimens and  should not be used as a sole basis for treatment. Nasal washings and  aspirates are unacceptable for Xpert Xpress SARS-CoV-2/FLU/RSV  testing. Fact Sheet for Patients: PinkCheek.be Fact Sheet for Healthcare Providers: GravelBags.it This test is not yet approved or cleared by the [place] FDA and  has been authorized for detection and/or diagnosis of SARS-CoV-2 by  FDA under an Emergency Use Authorization (EUA). This EUA will remain  in effect (meaning this test can be used) for the duration of the  Covid-19 declaration under Section 564(b)(1) of the Act, 21  U.S.C. section 360bbb-3(b)(1), unless the authorization is  terminated or revoked. Performed at [facility], [place] [address] [place], [place] [address]   Culture, blood (routine x 2) Call MD if unable to obtain prior to antibiotics being given     Status: None   Collection Time: 02/25/19  1:00 PM   Specimen: BLOOD  Result Value  Ref Range Status   Specimen Description BLOOD LEFT ANTECUBITAL  Final   Special Requests   Final    BOTTLES DRAWN AEROBIC AND ANAEROBIC Blood Culture adequate volume   Culture   Final    NO GROWTH 5 DAYS Performed at [facility], [place] [address], [place], [place] [address]    Report Status 03/02/2019 FINAL  Final  Culture, blood (routine x 2) Call MD if unable to obtain prior to antibiotics being given     Status: None   Collection Time: 02/25/19  1:16 PM   Specimen: BLOOD  Result Value Ref Range Status   Specimen Description BLOOD BLOOD LEFT FOREARM  Final   Special Requests   Final    BOTTLES DRAWN AEROBIC AND ANAEROBIC Blood Culture results may not be optimal due to an inadequate volume of blood received in culture bottles   Culture   Final    NO GROWTH 5 DAYS Performed at [facility], [place] [address], [place], [place] [address]    Report Status 03/02/2019 FINAL  Final  MRSA PCR Screening     Status: None   Collection Time: 02/26/19  2:08 AM   Specimen: Nasopharyngeal  Result Value Ref Range Status   MRSA by PCR NEGATIVE NEGATIVE Final    Comment:        The GeneXpert MRSA Assay (FDA approved for NASAL specimens only), is one component of a comprehensive MRSA colonization surveillance program. It is not intended to diagnose MRSA infection nor to guide or monitor treatment for MRSA infections. Performed at [facility], [place] [address], [place], [place] [address]          Radiology Studies: DG Swallowing [facility]  Result Date: 03/03/2019 Objective Swallowing Evaluation: Type of Study: MBS-Modified Barium Swallow Study  Patient Details Name: JAPNEET STAGGS MRN: [id number] Date of Birth: 11-10-1950 Today's Date: 03/03/2019 Time:  SLP Start Time (ACUTE ONLY): 9211 -SLP Stop Time (ACUTE ONLY): 1150 SLP Time Calculation (min) (ACUTE ONLY): 12 min Past Medical History: Past Medical History: Diagnosis Date . Anemia   anemia of chronic disease  likely 2/2 ESRD per last anemia panel (02/2011) with Fe 35, TIBC 167, ferritin 2041  // BL Hgb 8-10 . Anuria   Due to dialysis . Aortic stenosis, mild   03/2011 echo . Arthritis   Back . Blood transfusion 1990's  r/t Kidney removal surgery . Brain aneurysm   No records could be found . Colitis, ischemic ([place]) 01/2011  S/P partial colectomy of right hemicolon and ileostomy placement . Diabetes mellitus   Borderline . Dialysis patient [facility])   [place] Tues,Thursday, saturday . Dysrhythmia   SVT for brief period in Feb 2013 . End stage renal disease on dialysis [facility])   Secondary to hypertension // T/Th/Sat dialysis on [employer] . Foot drop, bilateral   after back surgery . Heart murmur   Born with heart mumur, does not require follow up per pt . Hepatitis C  . Hypertension   Does not see a heart doctor, had pre transplant stress test at [facility]   . Lumbar spinal stenosis    bilateral L4-5 and L5-S1 posterior lumbar interbody arthrodesis and bilateral L4-S1 posterior lateral arthrodesis for lumbar stenosis dynamic lumbar spondylolisthesis lumbar spondylosis lumbar radiculopathy with foot drop // s/p surgery 01/2011 - see surgery section for details. . Seizures ([place])   r/t HTN in 1990's x 1 . Umbilical hernia age 37 Past Surgical History: Past Surgical History: Procedure Laterality Date . A/V FISTULAGRAM Right 02/05/2019  Procedure: A/V FISTULAGRAM;  Surgeon: Katha Cabal, MD;  Location: [facility] [facility];  Service: Cardiovascular;  Laterality: Right; . APPENDECTOMY  1960's . AV FISTULA PLACEMENT  04/08/2011  Procedure: INSERTION OF ARTERIOVENOUS (AV) GORE-TEX GRAFT ARM;  Surgeon: Rosetta Posner, MD;  Location: [facility] OR;  Service: Vascular;  Laterality: Right;  Revision of Upper Arm Gore-Tex Graft . BACK SURGERY  01/30/2011  L4-S1 lumbar laminectomy facetectomy and foraminotomies for decompression a bilateral L4-5 and L5-S1 posterior lumbar interbody arthrodesis and bilateral L4-S1 posterior lateral  arthrodesis for lumbar stenosis dynamic lumbar spondylolisthesis lumbar spondylosis lumbar radiculopathy with foot drop . COLONOSCOPY  02/19/2011  Procedure: COLONOSCOPY;  Surgeon: Missy Sabins, MD;  Location: [place];  Service: Endoscopy;  Laterality: N/A; . COLONOSCOPY  03/07/2011  Procedure: COLONOSCOPY;  Surgeon: Cleotis Nipper, MD;  Location: [facility] ENDOSCOPY;  Service: Endoscopy;  Laterality: N/A;  no prep needed--pt has long Hartmann's pouch and ileostomy . COLOSTOMY  02/09/2011  Reason for surgery: ischemic colitis of right hemicolon requiring partial colectomy and ileostomy.  Surgeon: Harl Bowie, MD;  Location: [place];  Service: General;  Laterality: Right; . ILEOSTOMY CLOSURE  05/29/2011  Procedure: ILEOSTOMY TAKEDOWN;  Surgeon: Harl Bowie, MD;  Location: [place];  Service: General;  Laterality: N/A; . INSERTION OF DIALYSIS CATHETER Right 04/15/2016  Procedure: INSERTION OF DIALYSIS CATHETER RIGHT INTERNAL JUGULAR;  Surgeon: Conrad Cresbard, MD;  Location: [place];  Service: Vascular;  Laterality: Right; . LAPAROTOMY  02/09/2011  Procedure: EXPLORATORY LAPAROTOMY;  Surgeon: Harl Bowie, MD;  Location: [place];  Service: General;  Laterality: N/A; . NEPHRECTOMY  2010  right side done at [facility] on transplant list . PARTIAL COLECTOMY  02/09/2011  Reason for surgery: ischemic colitis of right hemicolon; Surgeon: Harl Bowie, MD;  Location: [place];  Service: General;  Laterality: Right; .  REVISION OF ARTERIOVENOUS GORETEX GRAFT Right 04/15/2016  Procedure: REVISION OF ARTERIOVENOUS GORETEX GRAFT USING 57mX20cm GORETEX GRAFT;  Surgeon: BConrad [place] MD;  [place] [facility]  Service: Vascular;  Laterality: Right; . RIB RESECTION    d/t kidney removal . TONSILLECTOMY    as a child . total parathyroidectomy  01/17/2001  with autotransplantation into left forearm // due to secondary hyperparathyroidism from ESRD . UMBILICAL HERNIA REPAIR  age 68- 82. UPPER EXTREMITY ANGIOGRAPHY Right 07/21/2017   Procedure: UPPER EXTREMITY ANGIOGRAPHY;  Surgeon: CWaynetta Sandy MD;  [facility];  Service: Cardiovascular;  Laterality: Right; .Marland KitchenVASCULAR SURGERY    right arm dialysis graft HPI: EBRAYLI KLINGBEILa 68y.o.femalewith medical history significant ofseizures, HTN; hep C; ESRD on TTS HD; DM; ischemic colitis s/p hemicolectomy and ileostomy; and brain aneurysm presenting with SOB.Per chart pt  [facility]   No data recorded Assessment / Plan / Recommendation CHL IP CLINICAL IMPRESSIONS 03/03/2019 Clinical Impression Pt appeared more lucid compared to yesterday during today's MBS. She demonstrated minimal oropharyngeal dysphagia with decreased aspiration risk. Timing of swallow initiation and laryngeal closure adequate with varying bolus sizes and textures wtihout aspiration or penetration during this study. Slower mastication and transit of regular texture. Minimal vallecular and pyriform sinus residue. She does have prominent cervical bony protrusion and appears to be slightly fused (?) near C 4-5 resulting in slower transit through cervical esophagus and promiment cricopharyngeus. No difficulty with barium pill and thin. Recommend continue Dys, return to thin liquids, straws allowed, pills with thin. Will follow for diet advancement.    SLP Visit Diagnosis Dysphagia, oral phase (R13.11) Attention and concentration deficit following -- Frontal lobe and executive function deficit following -- Impact on safety and function Mild aspiration risk   CHL IP TREATMENT RECOMMENDATION 03/03/2019 Treatment Recommendations Therapy as outlined in treatment plan below   Prognosis 03/03/2019 Prognosis for Safe Diet Advancement Good Barriers to Reach Goals Cognitive deficits Barriers/Prognosis Comment -- CHL IP DIET RECOMMENDATION 03/03/2019 SLP Diet Recommendations Thin liquid;Dysphagia 2 (Fine chop) solids Liquid Administration via Cup;Straw Medication Administration  Whole meds with puree Compensations Slow rate;Small sips/bites Postural Changes Seated upright at 90 degrees   CHL IP OTHER RECOMMENDATIONS 03/03/2019 Recommended Consults -- Oral Care Recommendations Oral care BID Other Recommendations --   CHL IP FOLLOW UP RECOMMENDATIONS 03/03/2019 Follow up Recommendations 24 hour supervision/assistance   CHL IP FREQUENCY AND DURATION 03/03/2019 Speech Therapy Frequency (ACUTE ONLY) min 2x/week Treatment Duration 2 weeks      CHL IP ORAL PHASE 03/03/2019 Oral Phase Impaired Oral - Pudding Teaspoon -- Oral - Pudding Cup -- Oral - Honey Teaspoon -- Oral - Honey Cup -- Oral - Nectar Teaspoon -- Oral - Nectar Cup WFL Oral - Nectar Straw -- Oral - Thin Teaspoon -- Oral - Thin Cup WFL Oral - Thin Straw -- Oral - Puree -- Oral - Mech Soft -- Oral - Regular Delayed oral transit Oral - Multi-Consistency -- Oral - Pill -- Oral Phase - Comment --  CHL IP PHARYNGEAL PHASE 03/03/2019 Pharyngeal Phase Impaired Pharyngeal- Pudding Teaspoon -- Pharyngeal -- Pharyngeal- Pudding Cup -- Pharyngeal -- Pharyngeal- Honey Teaspoon -- Pharyngeal -- Pharyngeal- Honey Cup -- Pharyngeal -- Pharyngeal- Nectar Teaspoon -- Pharyngeal -- Pharyngeal- Nectar Cup WFL Pharyngeal -- Pharyngeal- Nectar Straw -- Pharyngeal -- Pharyngeal- Thin Teaspoon -- Pharyngeal -- Pharyngeal- Thin Cup Pharyngeal residue - valleculae;Pharyngeal residue - pyriform Pharyngeal -- Pharyngeal- Thin Straw Pharyngeal residue -  pyriform;Pharyngeal residue - valleculae Pharyngeal -- Pharyngeal- Puree -- Pharyngeal -- Pharyngeal- Mechanical Soft -- Pharyngeal -- Pharyngeal- Regular WFL Pharyngeal -- Pharyngeal- Multi-consistency -- Pharyngeal -- Pharyngeal- Pill -- Pharyngeal -- Pharyngeal Comment --  CHL IP CERVICAL ESOPHAGEAL PHASE 03/03/2019 Cervical Esophageal Phase (No Data) Pudding Teaspoon -- Pudding Cup -- Honey Teaspoon -- Honey Cup -- Nectar Teaspoon -- Nectar Cup -- Nectar Straw -- Thin Teaspoon -- Thin Cup -- Thin Straw --  Puree -- Mechanical Soft -- Regular -- Multi-consistency -- Pill -- Cervical Esophageal Comment -- [place] Siren 03/03/2019, 1:47 PM Orbie Pyo Litaker M.Ed Actor Pager [phone] Office [phone]                   Scheduled Meds: . calcium carbonate  2 tablet Oral TID WC  . Chlorhexidine Gluconate Cloth  6 each Topical Q0600  . heparin  5,000 Units Subcutaneous Q8H  . influenza vaccine adjuvanted  0.5 mL Intramuscular Tomorrow-1000  . lidocaine  1 patch Transdermal Q24H  . mouth rinse  15 mL Mouth Rinse BID  . multivitamin  1 tablet Oral Daily  . sodium chloride flush  3 mL Intravenous Q12H   Continuous Infusions: . sodium chloride            Aline August, MD [facility] 03/04/2019, 10:58 AM

## 2019-03-05 ENCOUNTER — Telehealth: Payer: Self-pay | Admitting: Hospice

## 2019-03-05 ENCOUNTER — Telehealth: Payer: Self-pay | Admitting: Nephrology

## 2019-03-05 LAB — GLUCOSE, CAPILLARY
Glucose-Capillary: 90 mg/dL (ref 70–99)
Glucose-Capillary: 85 mg/dL (ref 70–99)

## 2019-03-05 NOTE — Telephone Encounter (Signed)
Transition of care contact from inpatient facility  Date of discharge: 03/04/19 Date of contact: 03/05/19  Method of contact: Phone Who did you talk to? Daughter Elmyra Ricks  Patient contacted to discuss transition of care from recent inpatient hospitalization. Patient was admitted to [facility] from 12/10 to 03/04/19 with discharge diagnosis of HCAP PNA/Acute hypoxic respiratory failure, AMS. Doing well per daughter, currently doing home PT.  Medication changes were reviewed. Patient will follow up with outpatient dialysis center on Saturday. She did not go to dialysis on Thursday because they were told her BP was too low for dialysis at the hospital.  Other follow up: daughter will schedule follow up visit with primary care as well as palliative care.

## 2019-03-05 NOTE — Telephone Encounter (Signed)
Spoke with patient's daughter Elmyra Ricks regarding Palliative services and all questions were answered and she was in agreement with this.  I have scheduled a Telephone Consult for 03/31/19 @ 1 PM.

## 2019-03-31 ENCOUNTER — Other Ambulatory Visit: Payer: Medicare PPO | Admitting: Hospice

## 2019-03-31 ENCOUNTER — Other Ambulatory Visit: Payer: Self-pay

## 2019-03-31 DIAGNOSIS — N186 End stage renal disease: Secondary | ICD-10-CM

## 2019-03-31 DIAGNOSIS — Z515 Encounter for palliative care: Secondary | ICD-10-CM

## 2019-03-31 NOTE — Progress Notes (Signed)
[facility] Palliative Care Consult Note Telephone: [phone]  Fax: [phone]  PATIENT NAME: [name] DOB: September 07, 1950 MRN: [id number]  PRIMARY CARE PROVIDER:   Janie Morning, DO  REFERRING PROVIDER:  Janie Morning, DO [address],  [place] [address]  RESPONSIBLE PARTY:   Self [id number] Emergency Contact; Daughter Cindra Eves [phone]  TELEHEALTH VISIT STATEMENT Due to the COVID-19 crisis, this visit was done via telephone from my office. It was initiated and consented to by this patient and/or family.  RECOMMENDATIONS/PLAN:   Advance Care Planning/Goals of Care: Telehealth Visit consisted of building trust and discussions on Palliative Medicine as specialized medical care for people living with serious illness, aimed at facilitating better quality of life through symptoms relief, assisting with advance care plan and establishing goals of care. Patient affirmed she is a full code. Goals of care include to maximize quality of life and symptom management. Family open to future discussion of code status.  Symptom management: Patient continues on hemodialysis Tuesdays Thursdays and Saturdays; well tolerated. Last month, patient was admitted in the hospital for Healthcare associated pneumonia with concern for gram-negative pneumonia versus MRSA pneumonia. She completed treatment. Patient denied pain/discomfort, no coughing or respiratory distress;  daughter present during televisit with no concerns at this time. Encouraged ongoing supportive care. Follow up: Palliative care will continue to follow patient for goals of care clarification and symptom management. I spent 60  minutes providing this consultation, from 12.45pm to 1.45pm. More than 50% of the time in this consultation was spent on coordinating communication   HISTORY OF PRESENT ILLNESS:  [name] is a 69 y.o. year old female with multiple medical  problems including ESRD with hemodialysis, Seizure, Hep C. Palliative Care was asked to help address goals of care.   CODE STATUS: Full  PPS: 40% HOSPICE ELIGIBILITY/DIAGNOSIS: TBD  PAST MEDICAL HISTORY:  Past Medical History:  Diagnosis Date  . Anemia    anemia of chronic disease likely 2/2 ESRD per last anemia panel (02/2011) with Fe 35, TIBC 167, ferritin 2041  // BL Hgb 8-10  . Anuria    Due to dialysis  . Aortic stenosis, mild    03/2011 echo  . Arthritis    Back  . Blood transfusion 1990's   r/t Kidney removal surgery  . Brain aneurysm    No records could be found  . Colitis, ischemic ([place]) 01/2011   S/P partial colectomy of right hemicolon and ileostomy placement  . Diabetes mellitus    Borderline  . Dialysis patient [facility])    [place] Tues,Thursday, saturday  . Dysrhythmia    SVT for brief period in Feb 2013  . End stage renal disease on dialysis [facility])    Secondary to hypertension // T/Th/Sat dialysis on [employer]  . Foot drop, bilateral    after back surgery  . Heart murmur    Born with heart mumur, does not require follow up per pt  . Hepatitis C   . Hypertension    Does not see a heart doctor, had pre transplant stress test at [facility]    . Lumbar spinal stenosis     bilateral L4-5 and L5-S1 posterior lumbar interbody arthrodesis and bilateral L4-S1 posterior lateral arthrodesis for lumbar stenosis dynamic lumbar spondylolisthesis lumbar spondylosis lumbar radiculopathy with foot drop // s/p surgery 01/2011 - see surgery section for details.  . Seizures ([place])    r/t HTN in 1990's x 1  .  Umbilical hernia age 6    SOCIAL HX:  Social History   Tobacco Use  . Smoking status: Current Every Day Smoker    Packs/day: 0.50    Years: 48.00    Pack years: 24.00    Types: Cigarettes  . Smokeless tobacco: Never Used  . Tobacco comment: 6 cigarettes per day  Substance Use Topics  . Alcohol use: Yes    Alcohol/week: 3.0 - 4.0 standard drinks    Types:  3 - 4 Shots of liquor per week    Comment: 3-4 shots of liquor on Fridays    ALLERGIES:  Allergies  Allergen Reactions  . Iodinated Diagnostic Agents Other (See Comments)    Seziure Other reaction(s): Other (See Comments) Seziure   . Hydrocodone Nausea And Vomiting  . Metoprolol Nausea And Vomiting  . Iohexol Other (See Comments)    Reaction is convulsions     PERTINENT MEDICATIONS:  Outpatient Encounter Medications as of 03/31/2019  Medication Sig  . benzonatate (TESSALON PERLES) 100 MG capsule Take 100 mg by mouth daily as needed for cough.   . calcium carbonate (CALCI-CHEW) 1250 (500 Ca) MG chewable tablet Chew 3 tablets by mouth 3 (three) times daily after meals.   . diphenhydrAMINE (BENADRYL) 25 MG tablet Take 50 mg by mouth daily as needed (cold symptoms).  . folic acid-vitamin b complex-vitamin c-selenium-zinc (DIALYVITE) 3 MG TABS tablet Take 1 tablet by mouth daily.  . Ibuprofen 200 MG CAPS Take 400 mg by mouth every 6 (six) hours as needed for headache or moderate pain.   . mirtazapine (REMERON) 15 MG tablet Take 0.5 tablets (7.5 mg total) by mouth at bedtime.  Marland Kitchen oxyCODONE-acetaminophen (PERCOCET) 10-325 MG tablet Take 0.5 tablets by mouth 2 (two) times daily as needed.  . polyethylene glycol (MIRALAX) 17 g packet Take 17 g by mouth daily as needed for moderate constipation.  Marland Kitchen PROAIR HFA 108 (90 Base) MCG/ACT inhaler Inhale 1 puff into the lungs every 6 (six) hours as needed.  . senna (SENOKOT) 8.6 MG TABS tablet Take 1 tablet (8.6 mg total) by mouth 2 (two) times daily as needed for mild constipation.   No facility-administered encounter medications on file as of 03/31/2019.    Teodoro Spray, NP

## 2019-05-03 ENCOUNTER — Ambulatory Visit: Payer: Medicare Other | Admitting: Neurology

## 2019-05-18 ENCOUNTER — Other Ambulatory Visit: Payer: Self-pay | Admitting: *Deleted

## 2019-05-18 DIAGNOSIS — N186 End stage renal disease: Secondary | ICD-10-CM

## 2019-05-21 ENCOUNTER — Encounter (HOSPITAL_COMMUNITY): Payer: Medicare Other

## 2019-05-21 ENCOUNTER — Ambulatory Visit: Payer: Medicare Other | Admitting: Vascular Surgery

## 2019-05-26 ENCOUNTER — Other Ambulatory Visit: Payer: Self-pay

## 2019-05-26 ENCOUNTER — Other Ambulatory Visit: Payer: Medicare PPO | Admitting: Hospice

## 2019-05-26 DIAGNOSIS — Z515 Encounter for palliative care: Secondary | ICD-10-CM

## 2019-05-26 DIAGNOSIS — N186 End stage renal disease: Secondary | ICD-10-CM

## 2019-05-26 NOTE — Progress Notes (Signed)
[facility] Palliative Care Consult Note Telephone: [phone]  Fax: [phone]  PATIENT NAME: [name] DOB: 08/14/1950 MRN: [id number]  PRIMARY CARE PROVIDER:   Janie Morning, DO  REFERRING PROVIDER:  Janie Morning, DO [address],  [place] [address]  RESPONSIBLE PARTY:   Self [phone] Emergency Contact; Daughter Cindra Eves [phone]  TELEHEALTH VISIT STATEMENT Due to the COVID-19 crisis, this visit was done via telephone from my office. It was initiated and consented to by this patient and/or family.  RECOMMENDATIONS/PLAN:   Advance Care Planning/Goals of Care: Telehealth Visit consisted of building trust and follow up on palliative care. Patient affirmed she is a full code. Goals of care include to maximize quality of life and symptom management. Symptom management: Patient continues on hemodialysis Tuesdays Thursdays and Saturdays; well tolerated. No hospitalization since last visit. Patient denies pain but said sometimes she has arthritic pain in bilateral shoulder which is well controlled with PRN Percocet; no coughing or respiratory distress. She has routine visit with PCP 05/31/2019. Patient said she is upbeat, with no concerns/complaints at this time.  Encouraged ongoing supportive care.   Follow up: Palliative care will continue to follow patient for goals of care clarification and symptom management. I spent 46 minutes providing this consultation. More than 50% of the time in this consultation was spent on coordinating communication   HISTORY OF PRESENT ILLNESS:  [name] is a 69 y.o. year old female with multiple medical problems including ESRD with hemodialysis, Seizure, Hep C. Palliative Care was asked to help address goals of care.   CODE STATUS: Full  PPS: 40%  HOSPICE ELIGIBILITY/DIAGNOSIS: TBD  PAST MEDICAL HISTORY:  Past Medical History:  Diagnosis Date  . Anemia    anemia  of chronic disease likely 2/2 ESRD per last anemia panel (02/2011) with Fe 35, TIBC 167, ferritin 2041  // BL Hgb 8-10  . Anuria    Due to dialysis  . Aortic stenosis, mild    03/2011 echo  . Arthritis    Back  . Blood transfusion 1990's   r/t Kidney removal surgery  . Brain aneurysm    No records could be found  . Colitis, ischemic ([place]) 01/2011   S/P partial colectomy of right hemicolon and ileostomy placement  . Diabetes mellitus    Borderline  . Dialysis patient [facility])    [place] Tues,Thursday, saturday  . Dysrhythmia    SVT for brief period in Feb 2013  . End stage renal disease on dialysis [facility])    Secondary to hypertension // T/Th/Sat dialysis on [employer]  . Foot drop, bilateral    after back surgery  . Heart murmur    Born with heart mumur, does not require follow up per pt  . Hepatitis C   . Hypertension    Does not see a heart doctor, had pre transplant stress test at [facility]    . Lumbar spinal stenosis     bilateral L4-5 and L5-S1 posterior lumbar interbody arthrodesis and bilateral L4-S1 posterior lateral arthrodesis for lumbar stenosis dynamic lumbar spondylolisthesis lumbar spondylosis lumbar radiculopathy with foot drop // s/p surgery 01/2011 - see surgery section for details.  . Seizures ([place])    r/t HTN in 1990's x 1  . Umbilical hernia age 72    SOCIAL HX:  Social History   Tobacco Use  . Smoking status: Current Every Day Smoker    Packs/day: 0.50  Years: 48.00    Pack years: 24.00    Types: Cigarettes  . Smokeless tobacco: Never Used  . Tobacco comment: 6 cigarettes per day  Substance Use Topics  . Alcohol use: Yes    Alcohol/week: 3.0 - 4.0 standard drinks    Types: 3 - 4 Shots of liquor per week    Comment: 3-4 shots of liquor on Fridays    ALLERGIES:  Allergies  Allergen Reactions  . Iodinated Diagnostic Agents Other (See Comments)    Seziure Other reaction(s): Other (See Comments) Seziure   . Hydrocodone Nausea And  Vomiting  . Metoprolol Nausea And Vomiting  . Iohexol Other (See Comments)    Reaction is convulsions     PERTINENT MEDICATIONS:  Outpatient Encounter Medications as of 05/26/2019  Medication Sig  . benzonatate (TESSALON PERLES) 100 MG capsule Take 100 mg by mouth daily as needed for cough.   . calcium carbonate (CALCI-CHEW) 1250 (500 Ca) MG chewable tablet Chew 3 tablets by mouth 3 (three) times daily after meals.   . diphenhydrAMINE (BENADRYL) 25 MG tablet Take 50 mg by mouth daily as needed (cold symptoms).  . folic acid-vitamin b complex-vitamin c-selenium-zinc (DIALYVITE) 3 MG TABS tablet Take 1 tablet by mouth daily.  . Ibuprofen 200 MG CAPS Take 400 mg by mouth every 6 (six) hours as needed for headache or moderate pain.   . mirtazapine (REMERON) 15 MG tablet Take 0.5 tablets (7.5 mg total) by mouth at bedtime.  Marland Kitchen oxyCODONE-acetaminophen (PERCOCET) 10-325 MG tablet Take 0.5 tablets by mouth 2 (two) times daily as needed.  . polyethylene glycol (MIRALAX) 17 g packet Take 17 g by mouth daily as needed for moderate constipation.  Marland Kitchen PROAIR HFA 108 (90 Base) MCG/ACT inhaler Inhale 1 puff into the lungs every 6 (six) hours as needed.  . senna (SENOKOT) 8.6 MG TABS tablet Take 1 tablet (8.6 mg total) by mouth 2 (two) times daily as needed for mild constipation.   No facility-administered encounter medications on file as of 05/26/2019.    Teodoro Spray, NP

## 2019-06-30 ENCOUNTER — Ambulatory Visit (INDEPENDENT_AMBULATORY_CARE_PROVIDER_SITE_OTHER): Payer: Medicare PPO | Admitting: Nurse Practitioner

## 2019-06-30 ENCOUNTER — Encounter (INDEPENDENT_AMBULATORY_CARE_PROVIDER_SITE_OTHER): Payer: Self-pay | Admitting: Nurse Practitioner

## 2019-06-30 ENCOUNTER — Other Ambulatory Visit: Payer: Self-pay

## 2019-06-30 ENCOUNTER — Ambulatory Visit (INDEPENDENT_AMBULATORY_CARE_PROVIDER_SITE_OTHER): Payer: Medicare PPO

## 2019-06-30 VITALS — BP 95/61 | HR 92 | Resp 15 | Wt 120.0 lb

## 2019-06-30 DIAGNOSIS — T829XXS Unspecified complication of cardiac and vascular prosthetic device, implant and graft, sequela: Secondary | ICD-10-CM

## 2019-06-30 DIAGNOSIS — I10 Essential (primary) hypertension: Secondary | ICD-10-CM

## 2019-06-30 DIAGNOSIS — Z992 Dependence on renal dialysis: Secondary | ICD-10-CM

## 2019-06-30 DIAGNOSIS — N186 End stage renal disease: Secondary | ICD-10-CM | POA: Diagnosis not present

## 2019-06-30 DIAGNOSIS — M5416 Radiculopathy, lumbar region: Secondary | ICD-10-CM | POA: Diagnosis not present

## 2019-06-30 DIAGNOSIS — Z9862 Peripheral vascular angioplasty status: Secondary | ICD-10-CM | POA: Diagnosis not present

## 2019-07-05 ENCOUNTER — Other Ambulatory Visit: Payer: Self-pay

## 2019-07-05 ENCOUNTER — Encounter (INDEPENDENT_AMBULATORY_CARE_PROVIDER_SITE_OTHER): Payer: Self-pay | Admitting: Nurse Practitioner

## 2019-07-05 ENCOUNTER — Other Ambulatory Visit: Payer: Medicare PPO | Admitting: Hospice

## 2019-07-05 DIAGNOSIS — Z515 Encounter for palliative care: Secondary | ICD-10-CM

## 2019-07-05 DIAGNOSIS — Z992 Dependence on renal dialysis: Secondary | ICD-10-CM

## 2019-07-05 DIAGNOSIS — N186 End stage renal disease: Secondary | ICD-10-CM

## 2019-07-05 NOTE — Progress Notes (Signed)
Subjective:    Patient ID: [name], female    DOB: 04/28/1950, 69 y.o.   MRN: [id number] Chief Complaint  Patient presents with  . Follow-up    Today the patient presents today due to pain in her access arm as well as a drop in access flow.  The patient endorses having a pain in her arm during dialysis and she notes that the pain always happens about 2 or so hours into dialysis.  She has an aching in her arm and shoulder area.  She denies any swelling of the upper extremity.  She denies any numbness or coolness of her fingertips.  Patient denies any current issues with dialysis.  She denies any significant bleeding from her dialysis site.  She denies issues with access.  The patient had a recent intervention in November 2020 however she is unsure if the pain got better or worse following the intervention.  The patient does state that the pain is tolerable if she takes Tylenol while she is currently dialyzing.  She is able to tolerate at that time.  Today the patient has a flow volume of 875.  There is no hemodynamically significant velocity increase however at the distal graft there is a narrowing from 0.54 cm to 0.32 cm.  Previously placed stents are patent.   Review of Systems  Musculoskeletal: Positive for arthralgias.  Neurological: Positive for weakness.  All other systems reviewed and are negative.      Objective:   Physical Exam Vitals reviewed. Exam conducted with a chaperone present (Daughter present).  Constitutional:      Appearance: Normal appearance.  HENT:     Head: Normocephalic.  Cardiovascular:     Pulses:          Radial pulses are 1+ on the right side.     Arteriovenous access: right arteriovenous access is present.    Comments: Right brachial axillary graft Pulmonary:     Effort: Pulmonary effort is normal.  Neurological:     Mental Status: She is alert and oriented to person, place, and time.  Psychiatric:        Mood and Affect: Mood normal.        Behavior: Behavior normal.        Thought Content: Thought content normal.        Judgment: Judgment normal.     BP 95/61 (BP Location: Left Arm)   Pulse 92   Resp 15   Wt 120 lb (54.4 kg)   BMI 19.97 kg/m   Past Medical History:  Diagnosis Date  . Anemia    anemia of chronic disease likely 2/2 ESRD per last anemia panel (02/2011) with Fe 35, TIBC 167, ferritin 2041  // BL Hgb 8-10  . Anuria    Due to dialysis  . Aortic stenosis, mild    03/2011 echo  . Arthritis    Back  . Blood transfusion 1990's   r/t Kidney removal surgery  . Brain aneurysm    No records could be found  . Colitis, ischemic ([place]) 01/2011   S/P partial colectomy of right hemicolon and ileostomy placement  . Diabetes mellitus    Borderline  . Dialysis patient [facility])    [place] Tues,Thursday, saturday  . Dysrhythmia    SVT for brief period in Feb 2013  . End stage renal disease on dialysis [facility])    Secondary to hypertension // T/Th/Sat dialysis on [employer]  . Foot drop, bilateral  after back surgery  . Heart murmur    Born with heart mumur, does not require follow up per pt  . Hepatitis C   . Hypertension    Does not see a heart doctor, had pre transplant stress test at [facility]    . Lumbar spinal stenosis     bilateral L4-5 and L5-S1 posterior lumbar interbody arthrodesis and bilateral L4-S1 posterior lateral arthrodesis for lumbar stenosis dynamic lumbar spondylolisthesis lumbar spondylosis lumbar radiculopathy with foot drop // s/p surgery 01/2011 - see surgery section for details.  . Seizures ([place])    r/t HTN in 1990's x 1  . Umbilical hernia age 42    Social History   Socioeconomic History  . Marital status: Legally Separated    Spouse name: Not on file  . Number of children: 2  . Years of education: 10th grade  . Highest education level: Not on file  Occupational History  . Occupation: Retired    Comment: previously worked as a Photographer  . Smoking  status: Current Every Day Smoker    Packs/day: 0.50    Years: 48.00    Pack years: 24.00    Types: Cigarettes  . Smokeless tobacco: Never Used  . Tobacco comment: 6 cigarettes per day  Substance and Sexual Activity  . Alcohol use: Yes    Alcohol/week: 3.0 - 4.0 standard drinks    Types: 3 - 4 Shots of liquor per week    Comment: 3-4 shots of liquor on Fridays  . Drug use: No  . Sexual activity: Never    Birth control/protection: None  Other Topics Concern  . Not on file  Social History Narrative   Lives in [place] with daughter. Has 2 On HD since for 20 years. Worked as a Sports coach. Went to 10th grade. Can read and write.    Social Determinants of Health   Financial Resource Strain:   . Difficulty of Paying Living Expenses:   Food Insecurity:   . Worried About Charity fundraiser in the Last Year:   . Arboriculturist in the Last Year:   Transportation Needs:   . Film/video editor (Medical):   Marland Kitchen Lack of Transportation (Non-Medical):   Physical Activity:   . Days of Exercise per Week:   . Minutes of Exercise per Session:   Stress:   . Feeling of Stress :   Social Connections:   . Frequency of Communication with Friends and Family:   . Frequency of Social Gatherings with Friends and Family:   . Attends Religious Services:   . Active Member of Clubs or Organizations:   . Attends Archivist Meetings:   Marland Kitchen Marital Status:   Intimate Partner Violence:   . Fear of Current or Ex-Partner:   . Emotionally Abused:   Marland Kitchen Physically Abused:   . Sexually Abused:     Past Surgical History:  Procedure Laterality Date  . A/V FISTULAGRAM Right 02/05/2019   Procedure: A/V FISTULAGRAM;  Surgeon: Katha Cabal, MD;  Location: [place] CV LAB;  Service: Cardiovascular;  Laterality: Right;  . APPENDECTOMY  1960's  . AV FISTULA PLACEMENT  04/08/2011   Procedure: INSERTION OF ARTERIOVENOUS (AV) GORE-TEX GRAFT ARM;  Surgeon: Rosetta Posner, MD;  Location: [facility] OR;  Service:  Vascular;  Laterality: Right;  Revision of Upper Arm Gore-Tex Graft  . BACK SURGERY  01/30/2011   L4-S1 lumbar laminectomy facetectomy and foraminotomies for decompression a bilateral L4-5 and L5-S1  posterior lumbar interbody arthrodesis and bilateral L4-S1 posterior lateral arthrodesis for lumbar stenosis dynamic lumbar spondylolisthesis lumbar spondylosis lumbar radiculopathy with foot drop  . COLONOSCOPY  02/19/2011   Procedure: COLONOSCOPY;  Surgeon: Missy Sabins, MD;  Location: [place];  Service: Endoscopy;  Laterality: N/A;  . COLONOSCOPY  03/07/2011   Procedure: COLONOSCOPY;  Surgeon: Cleotis Nipper, MD;  Location: [facility] ENDOSCOPY;  Service: Endoscopy;  Laterality: N/A;  no prep needed--pt has long Hartmann's pouch and ileostomy  . COLOSTOMY  02/09/2011   Reason for surgery: ischemic colitis of right hemicolon requiring partial colectomy and ileostomy.  Surgeon: Harl Bowie, MD;  Location: [place];  Service: General;  Laterality: Right;  . ILEOSTOMY CLOSURE  05/29/2011   Procedure: ILEOSTOMY TAKEDOWN;  Surgeon: Harl Bowie, MD;  Location: [place];  Service: General;  Laterality: N/A;  . INSERTION OF DIALYSIS CATHETER Right 04/15/2016   Procedure: INSERTION OF DIALYSIS CATHETER RIGHT INTERNAL JUGULAR;  Surgeon: Conrad Millbrook, MD;  Location: [place];  Service: Vascular;  Laterality: Right;  . LAPAROTOMY  02/09/2011   Procedure: EXPLORATORY LAPAROTOMY;  Surgeon: Harl Bowie, MD;  Location: [place];  Service: General;  Laterality: N/A;  . NEPHRECTOMY  2010   right side done at [facility] on transplant list  . PARTIAL COLECTOMY  02/09/2011   Reason for surgery: ischemic colitis of right hemicolon; Surgeon: Harl Bowie, MD;  Location: [place];  Service: General;  Laterality: Right;  . REVISION OF ARTERIOVENOUS GORETEX GRAFT Right 04/15/2016   Procedure: REVISION OF ARTERIOVENOUS GORETEX GRAFT USING 50mmX20cm GORETEX GRAFT;  Surgeon: Conrad Blackwater, MD;  Location: [place];  Service:  Vascular;  Laterality: Right;  . RIB RESECTION     d/t kidney removal  . TONSILLECTOMY     as a child  . total parathyroidectomy  01/17/2001   with autotransplantation into left forearm // due to secondary hyperparathyroidism from ESRD  . UMBILICAL HERNIA REPAIR  age 43- 53  . UPPER EXTREMITY ANGIOGRAPHY Right 07/21/2017   Procedure: UPPER EXTREMITY ANGIOGRAPHY;  Surgeon: Waynetta Sandy, MD;  Location: [place] CV LAB;  Service: Cardiovascular;  Laterality: Right;  Marland Kitchen VASCULAR SURGERY     right arm dialysis graft    Family History  Problem Relation Age of Onset  . Diabetes Father   . Stroke Mother   . COPD Brother   . Heart attack Brother   . Bronchitis Sister   . Anesthesia problems Neg Hx   . Hypotension Neg Hx   . Malignant hyperthermia Neg Hx   . Pseudochol deficiency Neg Hx     Allergies  Allergen Reactions  . Iodinated Diagnostic Agents Other (See Comments)    Seziure Other reaction(s): Other (See Comments) Seziure   . Hydrocodone Nausea And Vomiting  . Metoprolol Nausea And Vomiting  . Iohexol Other (See Comments)    Reaction is convulsions       Assessment & Plan:   1. End stage renal disease on dialysis [facility]) Had a long discussion with the patient and daughter in regards to the patient's axis.  As far as the patient's pain it could be still syndrome however the patient does have a good strong pulse in addition to the pain does not extend to the hand in the fingertips.  There is also no coldness, numbness or discoloration of her fingertips as well.  It could also be related to arthritis as addressed below.  Less likely is related to a narrowing in  her fistula causing the pain.  I also discussed the patient that the flow volume is decreased and intervention would be useful to prevent further worsening of flow in addition to progression of possible thrombosis.  However, at this time the patient wishes to continue with conservative therapies and does not  wish to undergo intervention unless is absolutely necessary.  We will have the patient return in 3 months with noninvasive studies, sooner if there are further issues with her fistula.  2. Essential hypertension Good blood pressure today.  Patient on appropriate medications.  No changes needed.  3. Lumbar radiculitis Part of patient's pain may be more so related to arthritis.  This is due to the fact that the patient has to hold her arm still for several hours at a time.  It is also reassuring that Tylenol relieves the pain.  This is also an indicator that it is likely related to arthritis.  Advised patient to continue with conservative treatment of pain.   Current Outpatient Medications on File Prior to Visit  Medication Sig Dispense Refill  . calcium carbonate (CALCI-CHEW) 1250 (500 Ca) MG chewable tablet Chew 3 tablets by mouth 3 (three) times daily after meals.     . diphenhydrAMINE (BENADRYL) 25 MG tablet Take 50 mg by mouth daily as needed (cold symptoms).    . folic acid-vitamin b complex-vitamin c-selenium-zinc (DIALYVITE) 3 MG TABS tablet Take 1 tablet by mouth daily.    . Ibuprofen 200 MG CAPS Take 400 mg by mouth every 6 (six) hours as needed for headache or moderate pain.     Marland Kitchen oxyCODONE-acetaminophen (PERCOCET) 10-325 MG tablet Take 0.5 tablets by mouth 2 (two) times daily as needed.    Marland Kitchen PROAIR HFA 108 (90 Base) MCG/ACT inhaler Inhale 1 puff into the lungs every 6 (six) hours as needed.    . benzonatate (TESSALON PERLES) 100 MG capsule Take 100 mg by mouth daily as needed for cough.     . mirtazapine (REMERON) 15 MG tablet Take 0.5 tablets (7.5 mg total) by mouth at bedtime. (Patient not taking: Reported on 06/30/2019)    . polyethylene glycol (MIRALAX) 17 g packet Take 17 g by mouth daily as needed for moderate constipation. (Patient not taking: Reported on 06/30/2019) 14 each 0  . senna (SENOKOT) 8.6 MG TABS tablet Take 1 tablet (8.6 mg total) by mouth 2 (two) times daily as needed  for mild constipation. (Patient not taking: Reported on 06/30/2019) 120 tablet 0   No current facility-administered medications on file prior to visit.    There are no Patient Instructions on file for this visit. No follow-ups on file.   Kris Hartmann, NP

## 2019-07-05 NOTE — Progress Notes (Signed)
[facility] Palliative Care Consult Note Telephone: [phone]  Fax: [phone]  PATIENT NAME: [name] DOB: December 31, 1950 MRN: [id number]  PRIMARY CARE PROVIDER:   Janie Morning, DO  REFERRING PROVIDER:  Janie Morning, [address] [place] [place],  [place] [address]  RESPONSIBLE PARTY:Self [phone] Emergency Contact; Daughter [name] [phone]  TELEHEALTH VISIT STATEMENT Due to the COVID-19 crisis, this visit was done via telephone from my office. It was initiated and consented to by this patient and/or family.  RECOMMENDATIONS/PLAN:  Advance Care Planning/Goals of Care: Telehealth Visit consisted of building trust and follow up on palliative care. Goals of care include to maximize quality of life and symptom management. Patient affirmed she is a full code; willing for an in-person visit next month to further discuss goals of care clarification and code status.  Symptom management:Patient reports  hemodialysis continues on Tuesdays Thursdays and Saturdays without any problems. She said at time she was having some pain around the dialysis cath but it was determined cath is without issues. Pain was from her bilateral shoulder arthritis; currently managed with percocet PRN. Patient with no complaints. No hospitalization since last visit. She denies coughing or respiratory distress.  Encouraged ongoing supportive care.   Follow HR:CBULAGTXMI care will continue to follow patient for goals of care clarification and symptom management. I spent1minutes providing this consultation; time includes documentation and chart review. More than 50% of the time in this consultation was spent on coordinating communication  HISTORY OF PRESENT ILLNESS:Jenna L Jeffersonis a 69 y.o.year oldfemalewith multiple medical problems including ESRD with hemodialysis, Seizure, Hep C.Palliative Care was asked to help address goals of care.     CODE STATUS: Full  PPS: 40% HOSPICE ELIGIBILITY/DIAGNOSIS: TBD  PAST MEDICAL HISTORY:  Past Medical History:  Diagnosis Date  . Anemia    anemia of chronic disease likely 2/2 ESRD per last anemia panel (02/2011) with Fe 35, TIBC 167, ferritin 2041  // BL Hgb 8-10  . Anuria    Due to dialysis  . Aortic stenosis, mild    03/2011 echo  . Arthritis    Back  . Blood transfusion 1990's   r/t Kidney removal surgery  . Brain aneurysm    No records could be found  . Colitis, ischemic ([place]) 01/2011   S/P partial colectomy of right hemicolon and ileostomy placement  . Diabetes mellitus    Borderline  . Dialysis patient [facility])    [place] Tues,Thursday, saturday  . Dysrhythmia    SVT for brief period in Feb 2013  . End stage renal disease on dialysis [facility])    Secondary to hypertension // T/Th/Sat dialysis on [employer]  . Foot drop, bilateral    after back surgery  . Heart murmur    Born with heart mumur, does not require follow up per pt  . Hepatitis C   . Hypertension    Does not see a heart doctor, had pre transplant stress test at [facility]    . Lumbar spinal stenosis     bilateral L4-5 and L5-S1 posterior lumbar interbody arthrodesis and bilateral L4-S1 posterior lateral arthrodesis for lumbar stenosis dynamic lumbar spondylolisthesis lumbar spondylosis lumbar radiculopathy with foot drop // s/p surgery 01/2011 - see surgery section for details.  . Seizures ([place])    r/t HTN in 1990's x 1  . Umbilical hernia age 64    SOCIAL HX:  Social History   Tobacco Use  . Smoking status: Current  Every Day Smoker    Packs/day: 0.50    Years: 48.00    Pack years: 24.00    Types: Cigarettes  . Smokeless tobacco: Never Used  . Tobacco comment: 6 cigarettes per day  Substance Use Topics  . Alcohol use: Yes    Alcohol/week: 3.0 - 4.0 standard drinks    Types: 3 - 4 Shots of liquor per week    Comment: 3-4 shots of liquor on Fridays    ALLERGIES:  Allergies  Allergen  Reactions  . Iodinated Diagnostic Agents Other (See Comments)    Seziure Other reaction(s): Other (See Comments) Seziure   . Hydrocodone Nausea And Vomiting  . Metoprolol Nausea And Vomiting  . Iohexol Other (See Comments)    Reaction is convulsions     PERTINENT MEDICATIONS:  Outpatient Encounter Medications as of 07/05/2019  Medication Sig  . benzonatate (TESSALON PERLES) 100 MG capsule Take 100 mg by mouth daily as needed for cough.   . calcium carbonate (CALCI-CHEW) 1250 (500 Ca) MG chewable tablet Chew 3 tablets by mouth 3 (three) times daily after meals.   . diphenhydrAMINE (BENADRYL) 25 MG tablet Take 50 mg by mouth daily as needed (cold symptoms).  . folic acid-vitamin b complex-vitamin c-selenium-zinc (DIALYVITE) 3 MG TABS tablet Take 1 tablet by mouth daily.  . Ibuprofen 200 MG CAPS Take 400 mg by mouth every 6 (six) hours as needed for headache or moderate pain.   . mirtazapine (REMERON) 15 MG tablet Take 0.5 tablets (7.5 mg total) by mouth at bedtime. (Patient not taking: Reported on 06/30/2019)  . oxyCODONE-acetaminophen (PERCOCET) 10-325 MG tablet Take 0.5 tablets by mouth 2 (two) times daily as needed.  . polyethylene glycol (MIRALAX) 17 g packet Take 17 g by mouth daily as needed for moderate constipation. (Patient not taking: Reported on 06/30/2019)  . PROAIR HFA 108 (90 Base) MCG/ACT inhaler Inhale 1 puff into the lungs every 6 (six) hours as needed.  . senna (SENOKOT) 8.6 MG TABS tablet Take 1 tablet (8.6 mg total) by mouth 2 (two) times daily as needed for mild constipation. (Patient not taking: Reported on 06/30/2019)   No facility-administered encounter medications on file as of 07/05/2019.      Teodoro Spray, NP

## 2019-08-11 ENCOUNTER — Encounter: Payer: Self-pay | Admitting: Podiatry

## 2019-08-11 ENCOUNTER — Other Ambulatory Visit: Payer: Medicare PPO | Admitting: Hospice

## 2019-08-11 ENCOUNTER — Other Ambulatory Visit: Payer: Self-pay

## 2019-08-11 ENCOUNTER — Ambulatory Visit: Payer: Medicare PPO | Admitting: Podiatry

## 2019-08-11 DIAGNOSIS — M79675 Pain in left toe(s): Secondary | ICD-10-CM

## 2019-08-11 DIAGNOSIS — B351 Tinea unguium: Secondary | ICD-10-CM | POA: Diagnosis not present

## 2019-08-11 DIAGNOSIS — Z515 Encounter for palliative care: Secondary | ICD-10-CM

## 2019-08-11 DIAGNOSIS — M79674 Pain in right toe(s): Secondary | ICD-10-CM | POA: Insufficient documentation

## 2019-08-11 DIAGNOSIS — Z992 Dependence on renal dialysis: Secondary | ICD-10-CM

## 2019-08-11 NOTE — Progress Notes (Signed)
This patient returns to my office for at risk foot care.  This patient requires this care by a professional since this patient will be at risk due to having coagulation defect,  ESRD and type 2 diabetes..  Patient has dropfoot  B/L.  This patient is unable to cut nails herself since the patient cannot reach her nails.These nails are painful walking and wearing shoes.  This patient presents for at risk foot care today.  General Appearance  Alert, conversant and in no acute stress.  Vascular  Dorsalis pedis  are palpable  bilaterally. Posterior tibial pulses are absent  B/L. Capillary return is within normal limits  bilaterally. Temperature is within normal limits  bilaterally.  Neurologic  Senn-Weinstein monofilament wire test absent right foot.  LOPS diminished left foot.  . Muscle power within normal limits bilaterally.  Nails Thick disfigured discolored nails with subungual debris  from hallux to fifth toes bilaterally. No evidence of bacterial infection or drainage bilaterally.  Orthopedic  No limitations of motion  feet .  No crepitus or effusions noted.  No bony pathology or digital deformities noted. Hallux limitus 1st MPJ  Left foot.  Skin  normotropic skin with no porokeratosis noted bilaterally.  No signs of infections or ulcers noted.     Onychomycosis  Pain in right toes  Pain in left toes  Consent was obtained for treatment procedures.   Mechanical debridement of nails 1-5  bilaterally performed with a nail nipper.  Filed with dremel without incident.    Return office visit  3 months                    Told patient to return for periodic foot care and evaluation due to potential at risk complications.   Gardiner Barefoot DPM

## 2019-08-11 NOTE — Progress Notes (Addendum)
[facility] Consult Note Telephone: [phone]  Fax: [phone]  PATIENT NAME: [name] DOB: 1950-07-17 MRN: [id number]  PRIMARY CARE PROVIDER:   Janie Morning, DO  REFERRING PROVIDER: Janie Morning, DO RESPONSIBLE PARTY:Self [phone] Emergency Contact; Daughter Cindra Eves [phone]  RECOMMENDATIONS/PLAN:  Advance Care Planning/Goals of Care:  Visit consisted of building trust andfollow up on palliative care.Goals of care include to maximize quality of life and symptom management. Patient affirmed she is a full code.  Visit consisted of counseling and education dealing with the complex and emotionally intense issues of symptom management and palliative care in the setting of serious and potentially life-threatening illness. Palliative care team will continue to support patient, patient's family, and medical team.  Patient said in the future, she will consider a change in her code status when it becomes necessary. Symptom management:Patient reports  hemodialysis continues on Tuesdays Thursdays and Saturdays without any problems.  She endorsed pain in her joints especially in the morning when she wakes up; pain is well managed with oxycodone as ordered. No fall or hospitalization since last visit.  Patient with occasional nonproductive cough during visit.;  No shortness of breath.  She said this is chronic and is related to her previous dx of  COPD.  She said she will call her pharmacy for a refill of her benzonatate.  She has had albuterol at home and uses it occasionally, not daily.  Patient reported she is still smoking leaving against her wish.  Therapeutic listening provided.  Education provided on the need to quit smoking and the health benefits associated with not smoking.  She is agreeable and said she plans to stop smoking.  NP also encouraged her to discuss her plan.  She reported she is currently on physical  therapy for strengthening 2 times a week.  She acknowledged physical therapy has been helpful with her ongoing weakness.  Education on continuing compliance with hemodialysis, active participation in physical therapy, rest in between activities as needed.  Patient in no medical  Acuity; denies any complaints at this time.  NPencouraged ongoing care.  Follow [id number] care will continue to follow patient for goals of care clarification and symptom management. I spent61minutes providing this consultation; time includes documentation and chart review. More than 50% of the time in this consultation was spent on coordinating communication  HISTORY OF PRESENT ILLNESS:Jenna L Jeffersonis a 69 y.o.year oldfemalewith multiple medical problems including ESRD with hemodialysis, Seizure, Hep C, COPD.  Palliative Care was asked to help address goals of care.    CODE STATUS: Full  PPS: 40% HOSPICE ELIGIBILITY/DIAGNOSIS: TBD  PAST MEDICAL HISTORY:  Past Medical History:  Diagnosis Date  . Anemia    anemia of chronic disease likely 2/2 ESRD per last anemia panel (02/2011) with Fe 35, TIBC 167, ferritin 2041  // BL Hgb 8-10  . Anuria    Due to dialysis  . Aortic stenosis, mild    03/2011 echo  . Arthritis    Back  . Blood transfusion 1990's   r/t Kidney removal surgery  . Brain aneurysm    No records could be found  . Colitis, ischemic ([place]) 01/2011   S/P partial colectomy of right hemicolon and ileostomy placement  . Diabetes mellitus    Borderline  . Dialysis patient [facility])    [place] Tues,Thursday, saturday  . Dysrhythmia    SVT for brief period in Feb 2013  . End stage renal disease on  dialysis [facility])    Secondary to hypertension // T/Th/Sat dialysis on [employer]  . Foot drop, bilateral    after back surgery  . Heart murmur    Born with heart mumur, does not require follow up per pt  . Hepatitis C   . Hypertension    Does not see a heart doctor, had pre  transplant stress test at [facility]    . Lumbar spinal stenosis     bilateral L4-5 and L5-S1 posterior lumbar interbody arthrodesis and bilateral L4-S1 posterior lateral arthrodesis for lumbar stenosis dynamic lumbar spondylolisthesis lumbar spondylosis lumbar radiculopathy with foot drop // s/p surgery 01/2011 - see surgery section for details.  . Seizures ([place])    r/t HTN in 1990's x 1  . Umbilical hernia age 56    SOCIAL HX:  Social History   Tobacco Use  . Smoking status: Current Every Day Smoker    Packs/day: 0.50    Years: 48.00    Pack years: 24.00    Types: Cigarettes  . Smokeless tobacco: Never Used  . Tobacco comment: 6 cigarettes per day  Substance Use Topics  . Alcohol use: Yes    Alcohol/week: 3.0 - 4.0 standard drinks    Types: 3 - 4 Shots of liquor per week    Comment: 3-4 shots of liquor on Fridays    ALLERGIES:  Allergies  Allergen Reactions  . Iodinated Diagnostic Agents Other (See Comments)    Seziure Other reaction(s): Other (See Comments) Seziure   . Hydrocodone Nausea And Vomiting  . Metoprolol Nausea And Vomiting  . Iohexol Other (See Comments)    Reaction is convulsions     PERTINENT MEDICATIONS:  Outpatient Encounter Medications as of 08/11/2019  Medication Sig  . benzonatate (TESSALON PERLES) 100 MG capsule Take 100 mg by mouth daily as needed for cough.   . calcium carbonate (CALCI-CHEW) 1250 (500 Ca) MG chewable tablet Chew 3 tablets by mouth 3 (three) times daily after meals.   . diphenhydrAMINE (BENADRYL) 25 MG tablet Take 50 mg by mouth daily as needed (cold symptoms).  . folic acid-vitamin b complex-vitamin c-selenium-zinc (DIALYVITE) 3 MG TABS tablet Take 1 tablet by mouth daily.  . Ibuprofen 200 MG CAPS Take 400 mg by mouth every 6 (six) hours as needed for headache or moderate pain.   . mirtazapine (REMERON) 15 MG tablet Take 0.5 tablets (7.5 mg total) by mouth at bedtime. (Patient not taking: Reported on 06/30/2019)  .  oxyCODONE-acetaminophen (PERCOCET) 10-325 MG tablet Take 0.5 tablets by mouth 2 (two) times daily as needed.  . polyethylene glycol (MIRALAX) 17 g packet Take 17 g by mouth daily as needed for moderate constipation. (Patient not taking: Reported on 06/30/2019)  . PROAIR HFA 108 (90 Base) MCG/ACT inhaler Inhale 1 puff into the lungs every 6 (six) hours as needed.  . senna (SENOKOT) 8.6 MG TABS tablet Take 1 tablet (8.6 mg total) by mouth 2 (two) times daily as needed for mild constipation. (Patient not taking: Reported on 06/30/2019)   No facility-administered encounter medications on file as of 08/11/2019.    PHYSICAL EXAM/ROS:  General: NAD, frail appearing, thin, cooperative Cardiovascular: regular rate and rhythm Pulmonary: Occasional cough, normal respiratory effort, no shortness of breath.  Rhonchorous breath sounds in the bilateral upper lobes that cleared with coughing Abdomen: soft, nontender, + bowel sounds GU: no suprapubic tenderness Extremities: no edema, no joint deformities Skin: no rashes to exposed skin Neurological: Weakness but otherwise nonfocal  Teodoro Spray, NP

## 2019-08-17 DIAGNOSIS — Z992 Dependence on renal dialysis: Secondary | ICD-10-CM | POA: Diagnosis not present

## 2019-08-17 DIAGNOSIS — N186 End stage renal disease: Secondary | ICD-10-CM | POA: Diagnosis not present

## 2019-08-17 DIAGNOSIS — N2581 Secondary hyperparathyroidism of renal origin: Secondary | ICD-10-CM | POA: Diagnosis not present

## 2019-08-19 DIAGNOSIS — N186 End stage renal disease: Secondary | ICD-10-CM | POA: Diagnosis not present

## 2019-08-19 DIAGNOSIS — Z992 Dependence on renal dialysis: Secondary | ICD-10-CM | POA: Diagnosis not present

## 2019-08-19 DIAGNOSIS — N2581 Secondary hyperparathyroidism of renal origin: Secondary | ICD-10-CM | POA: Diagnosis not present

## 2019-08-21 DIAGNOSIS — N186 End stage renal disease: Secondary | ICD-10-CM | POA: Diagnosis not present

## 2019-08-21 DIAGNOSIS — Z992 Dependence on renal dialysis: Secondary | ICD-10-CM | POA: Diagnosis not present

## 2019-08-21 DIAGNOSIS — N2581 Secondary hyperparathyroidism of renal origin: Secondary | ICD-10-CM | POA: Diagnosis not present

## 2019-08-24 DIAGNOSIS — N186 End stage renal disease: Secondary | ICD-10-CM | POA: Diagnosis not present

## 2019-08-24 DIAGNOSIS — Z992 Dependence on renal dialysis: Secondary | ICD-10-CM | POA: Diagnosis not present

## 2019-08-24 DIAGNOSIS — N2581 Secondary hyperparathyroidism of renal origin: Secondary | ICD-10-CM | POA: Diagnosis not present

## 2019-08-26 DIAGNOSIS — N186 End stage renal disease: Secondary | ICD-10-CM | POA: Diagnosis not present

## 2019-08-26 DIAGNOSIS — N2581 Secondary hyperparathyroidism of renal origin: Secondary | ICD-10-CM | POA: Diagnosis not present

## 2019-08-26 DIAGNOSIS — Z992 Dependence on renal dialysis: Secondary | ICD-10-CM | POA: Diagnosis not present

## 2019-08-28 DIAGNOSIS — N2581 Secondary hyperparathyroidism of renal origin: Secondary | ICD-10-CM | POA: Diagnosis not present

## 2019-08-28 DIAGNOSIS — N186 End stage renal disease: Secondary | ICD-10-CM | POA: Diagnosis not present

## 2019-08-28 DIAGNOSIS — Z992 Dependence on renal dialysis: Secondary | ICD-10-CM | POA: Diagnosis not present

## 2019-08-31 DIAGNOSIS — N2581 Secondary hyperparathyroidism of renal origin: Secondary | ICD-10-CM | POA: Diagnosis not present

## 2019-08-31 DIAGNOSIS — N186 End stage renal disease: Secondary | ICD-10-CM | POA: Diagnosis not present

## 2019-08-31 DIAGNOSIS — Z992 Dependence on renal dialysis: Secondary | ICD-10-CM | POA: Diagnosis not present

## 2019-09-01 ENCOUNTER — Telehealth: Payer: Self-pay

## 2019-09-01 NOTE — Telephone Encounter (Signed)
Call to daughter, rescheduled June appt for July 28 at 10 am

## 2019-09-02 DIAGNOSIS — N2581 Secondary hyperparathyroidism of renal origin: Secondary | ICD-10-CM | POA: Diagnosis not present

## 2019-09-02 DIAGNOSIS — Z992 Dependence on renal dialysis: Secondary | ICD-10-CM | POA: Diagnosis not present

## 2019-09-02 DIAGNOSIS — N186 End stage renal disease: Secondary | ICD-10-CM | POA: Diagnosis not present

## 2019-09-04 DIAGNOSIS — N2581 Secondary hyperparathyroidism of renal origin: Secondary | ICD-10-CM | POA: Diagnosis not present

## 2019-09-04 DIAGNOSIS — N186 End stage renal disease: Secondary | ICD-10-CM | POA: Diagnosis not present

## 2019-09-04 DIAGNOSIS — Z992 Dependence on renal dialysis: Secondary | ICD-10-CM | POA: Diagnosis not present

## 2019-09-07 DIAGNOSIS — Z992 Dependence on renal dialysis: Secondary | ICD-10-CM | POA: Diagnosis not present

## 2019-09-07 DIAGNOSIS — N186 End stage renal disease: Secondary | ICD-10-CM | POA: Diagnosis not present

## 2019-09-07 DIAGNOSIS — N2581 Secondary hyperparathyroidism of renal origin: Secondary | ICD-10-CM | POA: Diagnosis not present

## 2019-09-09 DIAGNOSIS — N186 End stage renal disease: Secondary | ICD-10-CM | POA: Diagnosis not present

## 2019-09-09 DIAGNOSIS — Z992 Dependence on renal dialysis: Secondary | ICD-10-CM | POA: Diagnosis not present

## 2019-09-09 DIAGNOSIS — N2581 Secondary hyperparathyroidism of renal origin: Secondary | ICD-10-CM | POA: Diagnosis not present

## 2019-09-11 DIAGNOSIS — N2581 Secondary hyperparathyroidism of renal origin: Secondary | ICD-10-CM | POA: Diagnosis not present

## 2019-09-11 DIAGNOSIS — N186 End stage renal disease: Secondary | ICD-10-CM | POA: Diagnosis not present

## 2019-09-11 DIAGNOSIS — Z992 Dependence on renal dialysis: Secondary | ICD-10-CM | POA: Diagnosis not present

## 2019-09-14 DIAGNOSIS — N186 End stage renal disease: Secondary | ICD-10-CM | POA: Diagnosis not present

## 2019-09-14 DIAGNOSIS — N2581 Secondary hyperparathyroidism of renal origin: Secondary | ICD-10-CM | POA: Diagnosis not present

## 2019-09-14 DIAGNOSIS — Z992 Dependence on renal dialysis: Secondary | ICD-10-CM | POA: Diagnosis not present

## 2019-09-15 DIAGNOSIS — Z992 Dependence on renal dialysis: Secondary | ICD-10-CM | POA: Diagnosis not present

## 2019-09-15 DIAGNOSIS — I120 Hypertensive chronic kidney disease with stage 5 chronic kidney disease or end stage renal disease: Secondary | ICD-10-CM | POA: Diagnosis not present

## 2019-09-15 DIAGNOSIS — N186 End stage renal disease: Secondary | ICD-10-CM | POA: Diagnosis not present

## 2019-09-16 DIAGNOSIS — Z992 Dependence on renal dialysis: Secondary | ICD-10-CM | POA: Diagnosis not present

## 2019-09-16 DIAGNOSIS — N186 End stage renal disease: Secondary | ICD-10-CM | POA: Diagnosis not present

## 2019-09-16 DIAGNOSIS — N2581 Secondary hyperparathyroidism of renal origin: Secondary | ICD-10-CM | POA: Diagnosis not present

## 2019-09-18 DIAGNOSIS — Z992 Dependence on renal dialysis: Secondary | ICD-10-CM | POA: Diagnosis not present

## 2019-09-18 DIAGNOSIS — N2581 Secondary hyperparathyroidism of renal origin: Secondary | ICD-10-CM | POA: Diagnosis not present

## 2019-09-18 DIAGNOSIS — N186 End stage renal disease: Secondary | ICD-10-CM | POA: Diagnosis not present

## 2019-09-21 ENCOUNTER — Other Ambulatory Visit (INDEPENDENT_AMBULATORY_CARE_PROVIDER_SITE_OTHER): Payer: Self-pay | Admitting: Nurse Practitioner

## 2019-09-21 DIAGNOSIS — N186 End stage renal disease: Secondary | ICD-10-CM

## 2019-09-21 DIAGNOSIS — N2581 Secondary hyperparathyroidism of renal origin: Secondary | ICD-10-CM | POA: Diagnosis not present

## 2019-09-21 DIAGNOSIS — Z992 Dependence on renal dialysis: Secondary | ICD-10-CM | POA: Diagnosis not present

## 2019-09-22 ENCOUNTER — Other Ambulatory Visit: Payer: Self-pay

## 2019-09-22 ENCOUNTER — Other Ambulatory Visit: Payer: Medicare PPO | Admitting: Hospice

## 2019-09-22 DIAGNOSIS — Z515 Encounter for palliative care: Secondary | ICD-10-CM | POA: Diagnosis not present

## 2019-09-22 DIAGNOSIS — N186 End stage renal disease: Secondary | ICD-10-CM

## 2019-09-22 NOTE — Progress Notes (Signed)
[facility] Consult Note Telephone: [phone]  Fax: [phone]  PATIENT NAME: [name] DOB: Jan 09, 1951 MRN: [id number]  PRIMARY CARE PROVIDER:   Janie Morning, DO  REFERRING PROVIDER: Janie Morning, DO RESPONSIBLE PARTY:Self [phone] Emergency Contact; Daughter Cindra Eves [phone]  RECOMMENDATIONS/PLAN:  Advance Care Planning/Goals of Care:  Visit consisted of building trust andfollow up on palliative care.Goals of care include to maximize quality of life and symptom management.Patientshared she stays positive and has not given up on life; she affirmed she is a Full code. Mateo Flow is home during visit.  Visit consisted of counseling and education dealing with the complex and emotionally intense issues of symptom management and palliative care in the setting of serious and potentially life-threatening illness. Palliative care team will continue to support patient, patient's family, and medical team. Patient said in the future, she will consider a change in her code status when it becomes necessary. Symptom management:Patient continues with Hemodialysis Tuesdays Thursdays and Saturdayswithout any problems; she has been doing dialysis for about 30 years.  She endorsed pain in her joints especially in the morning when she wakes up; she took oxycodone during visit- effective.No fall or hospitalization since last visit.  Patient with chronic occasional cough especially in the morning related to COPD; phlegm is clear.   No shortness of breath.  She has albuterol at home and uses it occasionally, not daily.  Patient reported she is still smoking against her wish.  Therapeutic listening provided.  Education provided on the need to quit smoking and the health benefits associated with not smoking.  She said she is cutting back gradually. Education on continuing compliance with hemodialysis and taking her medications as  ordered. Daughter got her bicycle peddlar for exercise; discussion on rest in between activities as needed.  Patient in no medical  acuity; denies any complaints at this time.   NPencouraged ongoing care.  Follow [id number] care will continue to follow patient for goals of care clarification and symptom management. I spent25minutes providing this consultation; time includes documentation and chart review.More than 50% of the time in this consultation was spent on coordinating communication  HISTORY OF PRESENT ILLNESS:Leoma L Jeffersonis a 69 y.o.year oldfemalewith multiple medical problems including ESRD with hemodialysis, Seizure, Hep C, COPD.  Palliative Care was asked to help address goals of care.  CODE STATUS:Full  PPS:40% HOSPICE ELIGIBILITY/DIAGNOSIS: TBD  PAST MEDICAL HISTORY:  Past Medical History:  Diagnosis Date  . Anemia    anemia of chronic disease likely 2/2 ESRD per last anemia panel (02/2011) with Fe 35, TIBC 167, ferritin 2041  // BL Hgb 8-10  . Anuria    Due to dialysis  . Aortic stenosis, mild    03/2011 echo  . Arthritis    Back  . Blood transfusion 1990's   r/t Kidney removal surgery  . Brain aneurysm    No records could be found  . Colitis, ischemic ([place]) 01/2011   S/P partial colectomy of right hemicolon and ileostomy placement  . Diabetes mellitus    Borderline  . Dialysis patient [facility])    [place] Tues,Thursday, saturday  . Dysrhythmia    SVT for brief period in Feb 2013  . End stage renal disease on dialysis [facility])    Secondary to hypertension // T/Th/Sat dialysis on [employer]  . Foot drop, bilateral    after back surgery  . Heart murmur    Born with heart mumur, does not require follow  up per pt  . Hepatitis C   . Hypertension    Does not see a heart doctor, had pre transplant stress test at [facility]    . Lumbar spinal stenosis     bilateral L4-5 and L5-S1 posterior lumbar interbody arthrodesis and bilateral L4-S1  posterior lateral arthrodesis for lumbar stenosis dynamic lumbar spondylolisthesis lumbar spondylosis lumbar radiculopathy with foot drop // s/p surgery 01/2011 - see surgery section for details.  . Seizures ([place])    r/t HTN in 1990's x 1  . Umbilical hernia age 11    SOCIAL HX:  Social History   Tobacco Use  . Smoking status: Current Every Day Smoker    Packs/day: 0.50    Years: 48.00    Pack years: 24.00    Types: Cigarettes  . Smokeless tobacco: Never Used  . Tobacco comment: 6 cigarettes per day  Substance Use Topics  . Alcohol use: Yes    Alcohol/week: 3.0 - 4.0 standard drinks    Types: 3 - 4 Shots of liquor per week    Comment: 3-4 shots of liquor on Fridays    ALLERGIES:  Allergies  Allergen Reactions  . Iodinated Diagnostic Agents Other (See Comments)    Seziure Other reaction(s): Other (See Comments) Seziure   . Hydrocodone Nausea And Vomiting  . Metoprolol Nausea And Vomiting  . Other Other (See Comments)  . Iohexol Other (See Comments)    Reaction is convulsions     PERTINENT MEDICATIONS:  Outpatient Encounter Medications as of 09/22/2019  Medication Sig  . amLODipine (NORVASC) 10 MG tablet Take by mouth.  . benzonatate (TESSALON PERLES) 100 MG capsule Take 100 mg by mouth daily as needed for cough.   . diphenhydrAMINE (BENADRYL) 25 MG tablet Take 50 mg by mouth daily as needed (cold symptoms).  . Ibuprofen 200 MG CAPS Take 400 mg by mouth every 6 (six) hours as needed for headache or moderate pain.   Marland Kitchen oxyCODONE-acetaminophen (PERCOCET) 10-325 MG tablet Take 0.5 tablets by mouth 2 (two) times daily as needed.  Marland Kitchen PROAIR HFA 108 (90 Base) MCG/ACT inhaler Inhale 1 puff into the lungs every 6 (six) hours as needed.   No facility-administered encounter medications on file as of 09/22/2019.    PHYSICAL EXAM/ROS:  General: NAD, frail appearing, thin, cooperative Cardiovascular: regular rate and rhythm; denies chest pain Pulmonary: Occasional cough, normal  respiratory effort, no shortness of breath. Rhonchorous breath sounds in the bilateral upper lobes that cleared with coughing, no shortness of breath Abdomen: soft, nontender, + bowel sounds GU: no suprapubic tenderness Extremities: no edema, no joint deformities Skin: no rashes to exposed skin Neurological: Weakness but otherwise nonfocal Teodoro Spray, NP

## 2019-09-23 DIAGNOSIS — Z992 Dependence on renal dialysis: Secondary | ICD-10-CM | POA: Diagnosis not present

## 2019-09-23 DIAGNOSIS — N186 End stage renal disease: Secondary | ICD-10-CM | POA: Diagnosis not present

## 2019-09-23 DIAGNOSIS — N2581 Secondary hyperparathyroidism of renal origin: Secondary | ICD-10-CM | POA: Diagnosis not present

## 2019-09-25 DIAGNOSIS — N186 End stage renal disease: Secondary | ICD-10-CM | POA: Diagnosis not present

## 2019-09-25 DIAGNOSIS — Z992 Dependence on renal dialysis: Secondary | ICD-10-CM | POA: Diagnosis not present

## 2019-09-25 DIAGNOSIS — N2581 Secondary hyperparathyroidism of renal origin: Secondary | ICD-10-CM | POA: Diagnosis not present

## 2019-09-28 DIAGNOSIS — N2581 Secondary hyperparathyroidism of renal origin: Secondary | ICD-10-CM | POA: Diagnosis not present

## 2019-09-28 DIAGNOSIS — Z992 Dependence on renal dialysis: Secondary | ICD-10-CM | POA: Diagnosis not present

## 2019-09-28 DIAGNOSIS — N186 End stage renal disease: Secondary | ICD-10-CM | POA: Diagnosis not present

## 2019-09-29 ENCOUNTER — Encounter (INDEPENDENT_AMBULATORY_CARE_PROVIDER_SITE_OTHER): Payer: Medicare PPO

## 2019-09-29 ENCOUNTER — Ambulatory Visit (INDEPENDENT_AMBULATORY_CARE_PROVIDER_SITE_OTHER): Payer: Medicare PPO | Admitting: Nurse Practitioner

## 2019-09-29 ENCOUNTER — Encounter (INDEPENDENT_AMBULATORY_CARE_PROVIDER_SITE_OTHER): Payer: Self-pay

## 2019-09-30 DIAGNOSIS — Z992 Dependence on renal dialysis: Secondary | ICD-10-CM | POA: Diagnosis not present

## 2019-09-30 DIAGNOSIS — N186 End stage renal disease: Secondary | ICD-10-CM | POA: Diagnosis not present

## 2019-09-30 DIAGNOSIS — N2581 Secondary hyperparathyroidism of renal origin: Secondary | ICD-10-CM | POA: Diagnosis not present

## 2019-10-05 DIAGNOSIS — N186 End stage renal disease: Secondary | ICD-10-CM | POA: Diagnosis not present

## 2019-10-05 DIAGNOSIS — N2581 Secondary hyperparathyroidism of renal origin: Secondary | ICD-10-CM | POA: Diagnosis not present

## 2019-10-05 DIAGNOSIS — Z992 Dependence on renal dialysis: Secondary | ICD-10-CM | POA: Diagnosis not present

## 2019-10-07 DIAGNOSIS — N2581 Secondary hyperparathyroidism of renal origin: Secondary | ICD-10-CM | POA: Diagnosis not present

## 2019-10-07 DIAGNOSIS — Z992 Dependence on renal dialysis: Secondary | ICD-10-CM | POA: Diagnosis not present

## 2019-10-07 DIAGNOSIS — N186 End stage renal disease: Secondary | ICD-10-CM | POA: Diagnosis not present

## 2019-10-09 DIAGNOSIS — N2581 Secondary hyperparathyroidism of renal origin: Secondary | ICD-10-CM | POA: Diagnosis not present

## 2019-10-09 DIAGNOSIS — N186 End stage renal disease: Secondary | ICD-10-CM | POA: Diagnosis not present

## 2019-10-09 DIAGNOSIS — Z992 Dependence on renal dialysis: Secondary | ICD-10-CM | POA: Diagnosis not present

## 2019-10-12 DIAGNOSIS — Z992 Dependence on renal dialysis: Secondary | ICD-10-CM | POA: Diagnosis not present

## 2019-10-12 DIAGNOSIS — N2581 Secondary hyperparathyroidism of renal origin: Secondary | ICD-10-CM | POA: Diagnosis not present

## 2019-10-12 DIAGNOSIS — N186 End stage renal disease: Secondary | ICD-10-CM | POA: Diagnosis not present

## 2019-10-13 ENCOUNTER — Other Ambulatory Visit: Payer: Self-pay

## 2019-10-13 ENCOUNTER — Other Ambulatory Visit: Payer: Medicare PPO | Admitting: Hospice

## 2019-10-14 DIAGNOSIS — N2581 Secondary hyperparathyroidism of renal origin: Secondary | ICD-10-CM | POA: Diagnosis not present

## 2019-10-14 DIAGNOSIS — N186 End stage renal disease: Secondary | ICD-10-CM | POA: Diagnosis not present

## 2019-10-14 DIAGNOSIS — Z992 Dependence on renal dialysis: Secondary | ICD-10-CM | POA: Diagnosis not present

## 2019-10-16 DIAGNOSIS — Z992 Dependence on renal dialysis: Secondary | ICD-10-CM | POA: Diagnosis not present

## 2019-10-16 DIAGNOSIS — N2581 Secondary hyperparathyroidism of renal origin: Secondary | ICD-10-CM | POA: Diagnosis not present

## 2019-10-16 DIAGNOSIS — N186 End stage renal disease: Secondary | ICD-10-CM | POA: Diagnosis not present

## 2019-10-16 DIAGNOSIS — I120 Hypertensive chronic kidney disease with stage 5 chronic kidney disease or end stage renal disease: Secondary | ICD-10-CM | POA: Diagnosis not present

## 2019-10-19 DIAGNOSIS — N186 End stage renal disease: Secondary | ICD-10-CM | POA: Diagnosis not present

## 2019-10-19 DIAGNOSIS — Z992 Dependence on renal dialysis: Secondary | ICD-10-CM | POA: Diagnosis not present

## 2019-10-19 DIAGNOSIS — N2581 Secondary hyperparathyroidism of renal origin: Secondary | ICD-10-CM | POA: Diagnosis not present

## 2019-10-21 DIAGNOSIS — N2581 Secondary hyperparathyroidism of renal origin: Secondary | ICD-10-CM | POA: Diagnosis not present

## 2019-10-21 DIAGNOSIS — N186 End stage renal disease: Secondary | ICD-10-CM | POA: Diagnosis not present

## 2019-10-21 DIAGNOSIS — Z992 Dependence on renal dialysis: Secondary | ICD-10-CM | POA: Diagnosis not present

## 2019-10-23 DIAGNOSIS — N186 End stage renal disease: Secondary | ICD-10-CM | POA: Diagnosis not present

## 2019-10-23 DIAGNOSIS — N2581 Secondary hyperparathyroidism of renal origin: Secondary | ICD-10-CM | POA: Diagnosis not present

## 2019-10-23 DIAGNOSIS — Z992 Dependence on renal dialysis: Secondary | ICD-10-CM | POA: Diagnosis not present

## 2019-10-25 ENCOUNTER — Ambulatory Visit
Admission: EM | Admit: 2019-10-25 | Discharge: 2019-10-25 | Disposition: A | Discharge status: Home / Self Care | Payer: Medicare PPO | Attending: Emergency Medicine | Admitting: Emergency Medicine

## 2019-10-25 ENCOUNTER — Ambulatory Visit (INDEPENDENT_AMBULATORY_CARE_PROVIDER_SITE_OTHER): Payer: Medicare PPO

## 2019-10-25 ENCOUNTER — Other Ambulatory Visit: Payer: Self-pay

## 2019-10-25 DIAGNOSIS — R509 Fever, unspecified: Secondary | ICD-10-CM

## 2019-10-25 DIAGNOSIS — R05 Cough: Secondary | ICD-10-CM | POA: Diagnosis not present

## 2019-10-25 DIAGNOSIS — J449 Chronic obstructive pulmonary disease, unspecified: Secondary | ICD-10-CM | POA: Diagnosis not present

## 2019-10-25 DIAGNOSIS — R059 Cough, unspecified: Secondary | ICD-10-CM

## 2019-10-25 DIAGNOSIS — J029 Acute pharyngitis, unspecified: Secondary | ICD-10-CM

## 2019-10-25 DIAGNOSIS — R0602 Shortness of breath: Secondary | ICD-10-CM | POA: Diagnosis not present

## 2019-10-25 DIAGNOSIS — J441 Chronic obstructive pulmonary disease with (acute) exacerbation: Secondary | ICD-10-CM | POA: Diagnosis not present

## 2019-10-25 MED ORDER — AEROCHAMBER PLUS FLO-VU MEDIUM MISC: 1.0000 | Freq: Once | 0 refills | Status: AC

## 2019-10-25 MED ORDER — PREDNISONE 50 MG PO TABS: 50.0000 mg | ORAL_TABLET | Freq: Every day | ORAL | 0 refills | Status: DC

## 2019-10-25 MED ORDER — ALBUTEROL SULFATE HFA 108 (90 BASE) MCG/ACT IN AERS: 2.0000 | INHALATION_SPRAY | RESPIRATORY_TRACT | 0 refills | Status: DC | PRN

## 2019-10-25 MED ORDER — FLUTICASONE PROPIONATE 50 MCG/ACT NA SUSP: 1.0000 | Freq: Every day | NASAL | 0 refills | Status: DC

## 2019-10-25 MED ORDER — CETIRIZINE HCL 10 MG PO TABS: 10.0000 mg | ORAL_TABLET | Freq: Every day | ORAL | 0 refills | Status: DC

## 2019-10-25 NOTE — Discharge Instructions (Addendum)
Steroid once daily with breakfast. Start flonase, atrovent nasal spray for nasal congestion/drainage.  You can use over the counter nasal saline rinse such as neti pot for nasal congestion.  Keep hydrated, your urine should be clear to pale yellow in color.  Tylenol/motrin for fever and pain.  Monitor for any worsening of symptoms, chest pain, shortness of breath, wheezing, swelling of the throat, go to the emergency department for further evaluation needed.

## 2019-10-25 NOTE — ED Triage Notes (Signed)
Per daughter pt c/o cough, fever, sore throat, and SOB  X2days. States hx of PNA and this feels the same.

## 2019-10-25 NOTE — ED Provider Notes (Signed)
[facility]    CSN: [id number] Arrival date & time: 10/25/19  1933      History   Chief Complaint Chief Complaint  Patient presents with  . Cough    HPI [name] is a 69 y.o. female with extensive medical history as outlined below presenting with her daughter for evaluation of URI symptoms x2 days.  Daughter provides history, patient corroborates.  Mildly productive cough with increased dyspnea.  No chest pain, palpitations, lightheadedness, dizziness.  Does endorse subjective fever.  Goes to dialysis on Tuesday, Thursdays, Saturdays, has not missed any of these appointments.  Endorsing history of pneumonia, concern for the same.  Is fully Covid vaccinated, no known contacts.    Past Medical History:  Diagnosis Date  . Anemia    anemia of chronic disease likely 2/2 ESRD per last anemia panel (02/2011) with Fe 35, TIBC 167, ferritin 2041  // BL Hgb 8-10  . Anuria    Due to dialysis  . Aortic stenosis, mild    03/2011 echo  . Arthritis    Back  . Blood transfusion 1990's   r/t Kidney removal surgery  . Brain aneurysm    No records could be found  . Colitis, ischemic ([place]) 01/2011   S/P partial colectomy of right hemicolon and ileostomy placement  . Diabetes mellitus    Borderline  . Dialysis patient [facility])    [place] Tues,Thursday, saturday  . Dysrhythmia    SVT for brief period in Feb 2013  . End stage renal disease on dialysis [facility])    Secondary to hypertension // T/Th/Sat dialysis on [employer]  . Foot drop, bilateral    after back surgery  . Heart murmur    Born with heart mumur, does not require follow up per pt  . Hepatitis C   . Hypertension    Does not see a heart doctor, had pre transplant stress test at [facility]    . Lumbar spinal stenosis     bilateral L4-5 and L5-S1 posterior lumbar interbody arthrodesis and bilateral L4-S1 posterior lateral arthrodesis for lumbar stenosis dynamic lumbar spondylolisthesis lumbar spondylosis  lumbar radiculopathy with foot drop // s/p surgery 01/2011 - see surgery section for details.  . Seizures ([place])    r/t HTN in 1990's x 1  . Umbilical hernia age 64    Patient Active Problem List   Diagnosis Date Noted  . Pain due to onychomycosis of toenails of both feet 08/11/2019  . Acute metabolic encephalopathy   . Pneumonia 02/25/2019  . Uremia 02/25/2019  . HCAP (healthcare-associated pneumonia) 02/23/2019  . Complication of vascular access for dialysis 01/17/2019  . Rheumatoid arthritis ([place]) 11/16/2018  . Complication of vascular dialysis catheter 04/15/2016  . At high risk for falls 05/19/2015  . Bilateral foot-drop 05/19/2015  . History of spontaneous subarachnoid intracranial hemorrhage due to cerebral aneurysm 05/19/2015  . Tobacco dependence 05/19/2015  . Underimmunization status 05/06/2015  . Headache 12/08/2013  . Fever, unspecified 12/08/2013  . Pruritus, unspecified 12/08/2013  . Diarrhea, unspecified 12/02/2013  . Pain, unspecified 12/02/2013  . Coagulation defect, unspecified ([place]) 11/29/2013  . Muscle weakness (generalized) 05/15/2012  . Abnormality of gait 05/15/2012  . Other acquired deformity of ankle and foot(736.79) 05/15/2012  . Iron deficiency anemia, unspecified 02/17/2012  . Lumbar radiculitis 01/17/2012  . Post herpetic neuralgia 11/25/2011  . Leg pain, right 10/14/2011  . Valtrex overdose 10/01/2011  . Shingles rash 09/30/2011  . Body mass index (BMI) less than or  equal to 19 in adult 07/10/2011  . Unspecified protein-calorie malnutrition ([place]) 06/12/2011  . Preop cardiovascular exam 05/28/2011  . Sinus tachycardia 04/23/2011  . Aortic stenosis 04/23/2011  . Ileostomy in place [facility]) 04/22/2011  . Other specified cardiac arrhythmias 04/04/2011  . Other acquired deformities of unspecified foot 03/22/2011  . Anemia of chronic kidney failure 02/09/2011  . Acute ischemic colitis ([place]) 02/09/2011  . Neuralgia and neuritis, unspecified 02/01/2011    . Lumbar stenosis with neurogenic claudication 01/30/2011  . Spondylolisthesis of lumbar region 01/30/2011  . End stage renal disease on dialysis ([place]) 01/30/2011  . Hypertension 01/30/2011  . Unspecified viral hepatitis C without hepatic coma 01/30/2011  . Hepatitis C antibody test positive 01/30/2011  . Alcohol abuse, uncomplicated 16/12/9602  . Awaiting organ transplant status 03/17/2009  . Benign neoplasm of unspecified kidney 03/17/2009  . Disorder of phosphorus metabolism, unspecified 03/17/2009  . Hypercalcemia 03/17/2009  . Other long term (current) drug therapy 03/17/2009  . Personal history of pneumonia (recurrent) 03/17/2009  . Patient's noncompliance with other medical treatment and regimen 03/17/2009  . Polycystic kidney, adult type 03/17/2009  . Acquired absence of kidney 06/21/2008  . Disorder of kidney and ureter, unspecified 06/14/2008  . Other abnormal glucose 05/06/2006  . Type 2 diabetes mellitus without complications ([place]) 54/11/8117  . Secondary hyperparathyroidism of renal origin ([place]) 12/17/2002  . Postprocedural hypoparathyroidism ([place]) 01/16/2001  . Contact with and (suspected) exposure to potentially hazardous body fluids 03/17/2000  . Other intervertebral disc degeneration, lumbosacral region 09/06/1999  . Allergy status to other drugs, medicaments and biological substances status 02/03/1998  . Hypertensive chronic kidney disease with stage 5 chronic kidney disease or end stage renal disease ([place]) 02/03/1998  . Nonrheumatic mitral (valve) insufficiency 02/03/1998  . Dependence on renal dialysis ([place]) 03/18/1992    Past Surgical History:  Procedure Laterality Date  . A/V FISTULAGRAM Right 02/05/2019   Procedure: A/V FISTULAGRAM;  Surgeon: Katha Cabal, MD;  Location: [place] [facility];  Service: Cardiovascular;  Laterality: Right;  . APPENDECTOMY  1960's  . AV FISTULA PLACEMENT  04/08/2011   Procedure: INSERTION OF ARTERIOVENOUS (AV) GORE-TEX  GRAFT ARM;  Surgeon: Rosetta Posner, MD;  Location: [facility] OR;  Service: Vascular;  Laterality: Right;  Revision of Upper Arm Gore-Tex Graft  . BACK SURGERY  01/30/2011   L4-S1 lumbar laminectomy facetectomy and foraminotomies for decompression a bilateral L4-5 and L5-S1 posterior lumbar interbody arthrodesis and bilateral L4-S1 posterior lateral arthrodesis for lumbar stenosis dynamic lumbar spondylolisthesis lumbar spondylosis lumbar radiculopathy with foot drop  . COLONOSCOPY  02/19/2011   Procedure: COLONOSCOPY;  Surgeon: Missy Sabins, MD;  Location: [place];  Service: Endoscopy;  Laterality: N/A;  . COLONOSCOPY  03/07/2011   Procedure: COLONOSCOPY;  Surgeon: Cleotis Nipper, MD;  Location: [facility] ENDOSCOPY;  Service: Endoscopy;  Laterality: N/A;  no prep needed--pt has long Hartmann's pouch and ileostomy  . COLOSTOMY  02/09/2011   Reason for surgery: ischemic colitis of right hemicolon requiring partial colectomy and ileostomy.  Surgeon: Harl Bowie, MD;  Location: [place];  Service: General;  Laterality: Right;  . ILEOSTOMY CLOSURE  05/29/2011   Procedure: ILEOSTOMY TAKEDOWN;  Surgeon: Harl Bowie, MD;  Location: [place];  Service: General;  Laterality: N/A;  . INSERTION OF DIALYSIS CATHETER Right 04/15/2016   Procedure: INSERTION OF DIALYSIS CATHETER RIGHT INTERNAL JUGULAR;  Surgeon: Conrad Stapleton, MD;  Location: [place];  Service: Vascular;  Laterality: Right;  . LAPAROTOMY  02/09/2011  Procedure: EXPLORATORY LAPAROTOMY;  Surgeon: Harl Bowie, MD;  Location: [place];  Service: General;  Laterality: N/A;  . NEPHRECTOMY  2010   right side done at [facility] on transplant list  . PARTIAL COLECTOMY  02/09/2011   Reason for surgery: ischemic colitis of right hemicolon; Surgeon: Harl Bowie, MD;  Location: [place];  Service: General;  Laterality: Right;  . REVISION OF ARTERIOVENOUS GORETEX GRAFT Right 04/15/2016   Procedure: REVISION OF ARTERIOVENOUS GORETEX GRAFT USING 51mmX20cm GORETEX  GRAFT;  Surgeon: Conrad Canones, MD;  Location: [place];  Service: Vascular;  Laterality: Right;  . RIB RESECTION     d/t kidney removal  . TONSILLECTOMY     as a child  . total parathyroidectomy  01/17/2001   with autotransplantation into left forearm // due to secondary hyperparathyroidism from ESRD  . UMBILICAL HERNIA REPAIR  age 21- 29  . UPPER EXTREMITY ANGIOGRAPHY Right 07/21/2017   Procedure: UPPER EXTREMITY ANGIOGRAPHY;  Surgeon: Waynetta Sandy, MD;  Location: [place] [facility];  Service: Cardiovascular;  Laterality: Right;  Marland Kitchen VASCULAR SURGERY     right arm dialysis graft    OB History   No obstetric history on file.      Home Medications    Prior to Admission medications   Medication Sig Start Date End Date Taking? Authorizing Provider  albuterol (VENTOLIN HFA) 108 (90 Base) MCG/ACT inhaler Inhale 2 puffs into the lungs every 4 (four) hours as needed for wheezing or shortness of breath. 10/25/19   Hall-Potvin, [place], PA-C  amLODipine (NORVASC) 10 MG tablet Take by mouth. 11/18/17   [provider]  cetirizine (ZYRTEC ALLERGY) 10 MG tablet Take 1 tablet (10 mg total) by mouth daily. 10/25/19   Hall-Potvin, [place], PA-C  diphenhydrAMINE (BENADRYL) 25 MG tablet Take 50 mg by mouth daily as needed (cold symptoms).    [provider]  fluticasone (FLONASE) 50 MCG/ACT nasal spray Place 1 spray into both nostrils daily. 10/25/19   Hall-Potvin, [place], PA-C  Ibuprofen 200 MG CAPS Take 400 mg by mouth every 6 (six) hours as needed for headache or moderate pain.  01/07/17   [provider]  oxyCODONE-acetaminophen (PERCOCET) 10-325 MG tablet Take 0.5 tablets by mouth 2 (two) times daily as needed. 03/04/19   Aline August, MD  predniSONE (DELTASONE) 50 MG tablet Take 1 tablet (50 mg total) by mouth daily with breakfast. 10/25/19   Hall-Potvin, [place], PA-C  Spacer/Aero-Holding Chambers (AEROCHAMBER PLUS FLO-VU MEDIUM) MISC 1 each by Other route once for  1 dose. 10/25/19 10/25/19  Hall-Potvin, [place], PA-C    Family History Family History  Problem Relation Age of Onset  . Diabetes Father   . Stroke Mother   . COPD Brother   . Heart attack Brother   . Bronchitis Sister   . Anesthesia problems Neg Hx   . Hypotension Neg Hx   . Malignant hyperthermia Neg Hx   . Pseudochol deficiency Neg Hx     Social History Social History   Tobacco Use  . Smoking status: Current Every Day Smoker    Packs/day: 0.50    Years: 48.00    Pack years: 24.00    Types: Cigarettes  . Smokeless tobacco: Never Used  . Tobacco comment: 6 cigarettes per day  Substance Use Topics  . Alcohol use: Yes    Alcohol/week: 3.0 - 4.0 standard drinks    Types: 3 - 4 Shots of liquor per week    Comment: 3-4 shots of  liquor on Fridays  . Drug use: No     Allergies   Iodinated diagnostic agents, Hydrocodone, Metoprolol, Other, and Iohexol   Review of Systems As per HPI   Physical Exam Triage Vital Signs ED Triage Vitals  Enc Vitals Group     BP 10/25/19 1944 129/70     Pulse Rate 10/25/19 1944 (!) 116     Resp 10/25/19 1944 20     Temp 10/25/19 1944 (!) 101.1 F (38.4 C)     Temp Source 10/25/19 1944 Oral     SpO2 10/25/19 1944 (!) 89 %     Weight --      Height --      Head Circumference --      Peak Flow --      Pain Score 10/25/19 1945 0     Pain Loc --      Pain Edu? --      Excl. in [place]? --    No data found.  Updated Vital Signs BP 129/70 (BP Location: Left Arm)   Pulse (!) 116   Temp (!) 101.1 F (38.4 C) (Oral)   Resp 20   SpO2 95%   Visual Acuity Right Eye Distance:   Left Eye Distance:   Bilateral Distance:    Right Eye Near:   Left Eye Near:    Bilateral Near:     Physical Exam Constitutional:      General: She is not in acute distress.    Appearance: She is obese. She is not ill-appearing or diaphoretic.  HENT:     Head: Normocephalic and atraumatic.     Right Ear: Tympanic membrane, ear canal and external ear  normal.     Left Ear: Tympanic membrane, ear canal and external ear normal.     Mouth/Throat:     Mouth: Mucous membranes are moist.     Pharynx: Oropharynx is clear. No oropharyngeal exudate or posterior oropharyngeal erythema.  Eyes:     General: No scleral icterus.    Conjunctiva/sclera: Conjunctivae normal.     Pupils: Pupils are equal, round, and reactive to light.  Neck:     Comments: Trachea midline, negative JVD Cardiovascular:     Rate and Rhythm: Normal rate and regular rhythm.     Heart sounds: No murmur heard.  No gallop.   Pulmonary:     Effort: Pulmonary effort is normal. No respiratory distress.     Breath sounds: Wheezing and rhonchi present. No rales.  Musculoskeletal:     Cervical back: Neck supple. No tenderness.  Lymphadenopathy:     Cervical: No cervical adenopathy.  Skin:    Capillary Refill: Capillary refill takes less than 2 seconds.     Coloration: Skin is not jaundiced or pale.     Findings: No rash.  Neurological:     General: No focal deficit present.     Mental Status: She is alert and oriented to person, place, and time.      UC Treatments / Results  Labs (all labs ordered are listed, but only abnormal results are displayed) Labs Reviewed  NOVEL CORONAVIRUS, NAA    EKG   Radiology DG Chest 2 View  Result Date: 10/25/2019 CLINICAL DATA:  Cough, fever, sore throat, shortness of breath EXAM: CHEST - 2 VIEW COMPARISON:  02/25/2019 FINDINGS: There is hyperinflation of the lungs compatible with COPD. Heart is borderline in size. No confluent airspace opacities or effusions. No acute bony abnormality. IMPRESSION: COPD.  No active disease.  Electronically Signed   By: Rolm Baptise M.D.   On: 10/25/2019 20:15    Procedures Procedures (including critical care time)  Medications Ordered in UC Medications - No data to display  Initial Impression / Assessment and Plan / UC Course  I have reviewed the triage vital signs and the nursing  notes.  Pertinent labs & imaging results that were available during my care of the patient were reviewed by me and considered in my medical decision making (see chart for details).     Patient febrile, though nontoxic, with SpO2 95%.  Chest x-ray done office, reviewed by me and radiology: Hyperinflation of lungs consistent with COPD.  No opacities, effusions.  Covid PCR pending.  Patient to quarantine until results are back.  We will treat supportively as outlined below.  Return precautions discussed, patient verbalized understanding and is agreeable to plan. Final Clinical Impressions(s) / UC Diagnoses   Final diagnoses:  Cough  Fever, unspecified  COPD exacerbation ([place])     Discharge Instructions     Steroid once daily with breakfast. Start flonase, atrovent nasal spray for nasal congestion/drainage.  You can use over the counter nasal saline rinse such as neti pot for nasal congestion.  Keep hydrated, your urine should be clear to pale yellow in color.  Tylenol/motrin for fever and pain.  Monitor for any worsening of symptoms, chest pain, shortness of breath, wheezing, swelling of the throat, go to the emergency department for further evaluation needed.     ED Prescriptions    Medication Sig Dispense Auth. Provider   albuterol (VENTOLIN HFA) 108 (90 Base) MCG/ACT inhaler Inhale 2 puffs into the lungs every 4 (four) hours as needed for wheezing or shortness of breath. 18 g Hall-Potvin, [place], PA-C   Spacer/Aero-Holding Chambers (AEROCHAMBER PLUS FLO-VU MEDIUM) MISC 1 each by Other route once for 1 dose. 1 each Hall-Potvin, [place], PA-C   predniSONE (DELTASONE) 50 MG tablet Take 1 tablet (50 mg total) by mouth daily with breakfast. 5 tablet Hall-Potvin, [place], PA-C   cetirizine (ZYRTEC ALLERGY) 10 MG tablet Take 1 tablet (10 mg total) by mouth daily. 30 tablet Hall-Potvin, [place], PA-C   fluticasone (FLONASE) 50 MCG/ACT nasal spray Place 1 spray into both nostrils daily.  16 g Hall-Potvin, [place], PA-C     PDMP not reviewed this encounter.   Hall-Potvin, [place], [place] 10/25/19 2024

## 2019-10-26 DIAGNOSIS — N2581 Secondary hyperparathyroidism of renal origin: Secondary | ICD-10-CM | POA: Diagnosis not present

## 2019-10-26 DIAGNOSIS — Z992 Dependence on renal dialysis: Secondary | ICD-10-CM | POA: Diagnosis not present

## 2019-10-26 DIAGNOSIS — N186 End stage renal disease: Secondary | ICD-10-CM | POA: Diagnosis not present

## 2019-10-28 DIAGNOSIS — Z992 Dependence on renal dialysis: Secondary | ICD-10-CM | POA: Diagnosis not present

## 2019-10-28 DIAGNOSIS — N2581 Secondary hyperparathyroidism of renal origin: Secondary | ICD-10-CM | POA: Diagnosis not present

## 2019-10-28 DIAGNOSIS — N186 End stage renal disease: Secondary | ICD-10-CM | POA: Diagnosis not present

## 2019-10-28 LAB — NOVEL CORONAVIRUS, NAA: SARS-CoV-2, NAA: NOT DETECTED

## 2019-10-30 DIAGNOSIS — Z992 Dependence on renal dialysis: Secondary | ICD-10-CM | POA: Diagnosis not present

## 2019-10-30 DIAGNOSIS — N2581 Secondary hyperparathyroidism of renal origin: Secondary | ICD-10-CM | POA: Diagnosis not present

## 2019-10-30 DIAGNOSIS — N186 End stage renal disease: Secondary | ICD-10-CM | POA: Diagnosis not present

## 2019-11-02 DIAGNOSIS — N2581 Secondary hyperparathyroidism of renal origin: Secondary | ICD-10-CM | POA: Diagnosis not present

## 2019-11-02 DIAGNOSIS — Z992 Dependence on renal dialysis: Secondary | ICD-10-CM | POA: Diagnosis not present

## 2019-11-02 DIAGNOSIS — N186 End stage renal disease: Secondary | ICD-10-CM | POA: Diagnosis not present

## 2019-11-04 DIAGNOSIS — N2581 Secondary hyperparathyroidism of renal origin: Secondary | ICD-10-CM | POA: Diagnosis not present

## 2019-11-04 DIAGNOSIS — N186 End stage renal disease: Secondary | ICD-10-CM | POA: Diagnosis not present

## 2019-11-04 DIAGNOSIS — Z992 Dependence on renal dialysis: Secondary | ICD-10-CM | POA: Diagnosis not present

## 2019-11-06 DIAGNOSIS — Z992 Dependence on renal dialysis: Secondary | ICD-10-CM | POA: Diagnosis not present

## 2019-11-06 DIAGNOSIS — N2581 Secondary hyperparathyroidism of renal origin: Secondary | ICD-10-CM | POA: Diagnosis not present

## 2019-11-06 DIAGNOSIS — N186 End stage renal disease: Secondary | ICD-10-CM | POA: Diagnosis not present

## 2019-11-09 DIAGNOSIS — Z992 Dependence on renal dialysis: Secondary | ICD-10-CM | POA: Diagnosis not present

## 2019-11-09 DIAGNOSIS — N186 End stage renal disease: Secondary | ICD-10-CM | POA: Diagnosis not present

## 2019-11-09 DIAGNOSIS — N2581 Secondary hyperparathyroidism of renal origin: Secondary | ICD-10-CM | POA: Diagnosis not present

## 2019-11-11 DIAGNOSIS — Z992 Dependence on renal dialysis: Secondary | ICD-10-CM | POA: Diagnosis not present

## 2019-11-11 DIAGNOSIS — N186 End stage renal disease: Secondary | ICD-10-CM | POA: Diagnosis not present

## 2019-11-11 DIAGNOSIS — N2581 Secondary hyperparathyroidism of renal origin: Secondary | ICD-10-CM | POA: Diagnosis not present

## 2019-11-13 DIAGNOSIS — N2581 Secondary hyperparathyroidism of renal origin: Secondary | ICD-10-CM | POA: Diagnosis not present

## 2019-11-13 DIAGNOSIS — Z992 Dependence on renal dialysis: Secondary | ICD-10-CM | POA: Diagnosis not present

## 2019-11-13 DIAGNOSIS — N186 End stage renal disease: Secondary | ICD-10-CM | POA: Diagnosis not present

## 2019-11-16 DIAGNOSIS — N2581 Secondary hyperparathyroidism of renal origin: Secondary | ICD-10-CM | POA: Diagnosis not present

## 2019-11-16 DIAGNOSIS — Z992 Dependence on renal dialysis: Secondary | ICD-10-CM | POA: Diagnosis not present

## 2019-11-16 DIAGNOSIS — N186 End stage renal disease: Secondary | ICD-10-CM | POA: Diagnosis not present

## 2019-11-18 DIAGNOSIS — N2581 Secondary hyperparathyroidism of renal origin: Secondary | ICD-10-CM | POA: Diagnosis not present

## 2019-11-18 DIAGNOSIS — Z992 Dependence on renal dialysis: Secondary | ICD-10-CM | POA: Diagnosis not present

## 2019-11-18 DIAGNOSIS — N186 End stage renal disease: Secondary | ICD-10-CM | POA: Diagnosis not present

## 2019-11-20 DIAGNOSIS — N186 End stage renal disease: Secondary | ICD-10-CM | POA: Diagnosis not present

## 2019-11-20 DIAGNOSIS — Z992 Dependence on renal dialysis: Secondary | ICD-10-CM | POA: Diagnosis not present

## 2019-11-20 DIAGNOSIS — N2581 Secondary hyperparathyroidism of renal origin: Secondary | ICD-10-CM | POA: Diagnosis not present

## 2019-11-23 DIAGNOSIS — Z992 Dependence on renal dialysis: Secondary | ICD-10-CM | POA: Diagnosis not present

## 2019-11-23 DIAGNOSIS — N186 End stage renal disease: Secondary | ICD-10-CM | POA: Diagnosis not present

## 2019-11-23 DIAGNOSIS — N2581 Secondary hyperparathyroidism of renal origin: Secondary | ICD-10-CM | POA: Diagnosis not present

## 2019-11-24 ENCOUNTER — Other Ambulatory Visit: Payer: Medicare PPO | Admitting: Hospice

## 2019-11-24 ENCOUNTER — Other Ambulatory Visit: Payer: Self-pay

## 2019-11-24 DIAGNOSIS — N186 End stage renal disease: Secondary | ICD-10-CM

## 2019-11-24 DIAGNOSIS — Z515 Encounter for palliative care: Secondary | ICD-10-CM

## 2019-11-24 NOTE — Progress Notes (Addendum)
[facility] Palliative Care Consult Note Telephone: [phone]  Fax: [phone]  PATIENT NAME: [name] DOB: [birth year] MRN: [id number]  PRIMARY CARE PROVIDER:   Janie Morning, DO Janie Morning, DO [address] [place],  [place] [address]  REFERRING PROVIDER: Janie Morning, DO Janie Morning, [address] [address] [place],  [place] [address]  RESPONSIBLE PARTY:Self [phone] Emergency Contact; Daughter [name] [address] [place] VISIT STATEMENT Due to the COVID-19 crisis, this visit was done via telephone from my office. It was initiated and consented to by this patient and/or family.   RECOMMENDATIONS/PLAN:   Advance Care Planning:  Visit consisted of building trust and discussions on Palliative Medicine as specialized medical care for people living with serious illness, aimed at facilitating better quality of life through symptoms relief, assisting with advance care plan and establishing goals of care.   Code Status: CODE STATUS reviewed.  Patient affirmed she remains a full code.  She is still open to consider change in the future when her health warrants it.  Goals of Care: Goals of care include to maximize quality of life and symptom management.  Follow up: Palliative care will continue to follow patient for goals of care clarification and symptom management.  Next visit in 2 months or as needed.  Symptom management: Cough: Patient continues with chronic cough related to COPD.  Last month she was seen at Urgent Care  10/25/2019 for cough.  Chart review indicates patient was discharged home with her steroid daily, Flonase for nasal congestion/drainage, to keep hydrated and to continue with her breathing treatments.  Patient reports feeling better even though she continues to cough occasionally, with occasional clear phlegm.  Smoking cessation reiterated. She denied phlegm or hospitalizations since  last visit.  No COPD exacerbation. Patient is regular with her hemodialysis-Tuesdays Thursdays and Saturdays; well-tolerated; she has been doing dialysis for about 30 years. Overall, patient believes she is doing well, with no complaint or concerns at this time.  Encouraged ongoing care.  Family /Caregiver/Community Supports:  Patient lives at home with family.    I spent 35  minutes providing this consultation; time includes time spent with patient/family, chart review, provider coordination,  and documentation. More than 50% of the time in this consultation was spent on coordinating communication   HISTORY OF PRESENT ILLNESS:Jenna L Jeffersonis a 69 y.o.year oldfemalewith multiple medical problems including ESRD with hemodialysis, Seizure, Hep C,COPD. Palliative Care was asked to help address goals of care.    CODE STATUS: Full  PPS: 40% HOSPICE ELIGIBILITY/DIAGNOSIS: TBD  PAST MEDICAL HISTORY:  Past Medical History:  Diagnosis Date  . Anemia    anemia of chronic disease likely 2/2 ESRD per last anemia panel (02/2011) with Fe 35, TIBC 167, ferritin 2041  // BL Hgb 8-10  . Anuria    Due to dialysis  . Aortic stenosis, mild    03/2011 echo  . Arthritis    Back  . Blood transfusion 1990's   r/t Kidney removal surgery  . Brain aneurysm    No records could be found  . Colitis, ischemic ([place]) 01/2011   S/P partial colectomy of right hemicolon and ileostomy placement  . Diabetes mellitus    Borderline  . Dialysis patient [facility])    [place] Tues,Thursday, saturday  . Dysrhythmia    SVT for brief period in Feb 2013  . End stage renal disease on dialysis [facility])    Secondary to hypertension //  T/Th/Sat dialysis on [employer]  . Foot drop, bilateral    after back surgery  . Heart murmur    Born with heart mumur, does not require follow up per pt  . Hepatitis C   . Hypertension    Does not see a heart doctor, had pre transplant stress test at [facility]    .  Lumbar spinal stenosis     bilateral L4-5 and L5-S1 posterior lumbar interbody arthrodesis and bilateral L4-S1 posterior lateral arthrodesis for lumbar stenosis dynamic lumbar spondylolisthesis lumbar spondylosis lumbar radiculopathy with foot drop // s/p surgery 01/2011 - see surgery section for details.  . Seizures ([place])    r/t HTN in 1990's x 1  . Umbilical hernia age 69    SOCIAL HX:  Social History   Tobacco Use  . Smoking status: Current Every Day Smoker    Packs/day: 0.50    Years: 48.00    Pack years: 24.00    Types: Cigarettes  . Smokeless tobacco: Never Used  . Tobacco comment: 6 cigarettes per day  Substance Use Topics  . Alcohol use: Yes    Alcohol/week: 3.0 - 4.0 standard drinks    Types: 3 - 4 Shots of liquor per week    Comment: 3-4 shots of liquor on Fridays    ALLERGIES:  Allergies  Allergen Reactions  . Iodinated Diagnostic Agents Other (See Comments)    Seziure Other reaction(s): Other (See Comments) Seziure   . Hydrocodone Nausea And Vomiting  . Metoprolol Nausea And Vomiting  . Other Other (See Comments)  . Iohexol Other (See Comments)    Reaction is convulsions     PERTINENT MEDICATIONS:  Outpatient Encounter Medications as of 11/24/2019  Medication Sig  . albuterol (VENTOLIN HFA) 108 (90 Base) MCG/ACT inhaler Inhale 2 puffs into the lungs every 4 (four) hours as needed for wheezing or shortness of breath.  Marland Kitchen amLODipine (NORVASC) 10 MG tablet Take by mouth.  . cetirizine (ZYRTEC ALLERGY) 10 MG tablet Take 1 tablet (10 mg total) by mouth daily.  . diphenhydrAMINE (BENADRYL) 25 MG tablet Take 50 mg by mouth daily as needed (cold symptoms).  . fluticasone (FLONASE) 50 MCG/ACT nasal spray Place 1 spray into both nostrils daily.  . Ibuprofen 200 MG CAPS Take 400 mg by mouth every 6 (six) hours as needed for headache or moderate pain.   Marland Kitchen oxyCODONE-acetaminophen (PERCOCET) 10-325 MG tablet Take 0.5 tablets by mouth 2 (two) times daily as needed.  .  predniSONE (DELTASONE) 50 MG tablet Take 1 tablet (50 mg total) by mouth daily with breakfast.   No facility-administered encounter medications on file as of 11/24/2019.    PHYSICAL EXAM/ROS:   Teodoro Spray, NP

## 2019-11-25 DIAGNOSIS — N186 End stage renal disease: Secondary | ICD-10-CM | POA: Diagnosis not present

## 2019-11-25 DIAGNOSIS — N2581 Secondary hyperparathyroidism of renal origin: Secondary | ICD-10-CM | POA: Diagnosis not present

## 2019-11-25 DIAGNOSIS — Z992 Dependence on renal dialysis: Secondary | ICD-10-CM | POA: Diagnosis not present

## 2019-11-27 DIAGNOSIS — Z992 Dependence on renal dialysis: Secondary | ICD-10-CM | POA: Diagnosis not present

## 2019-11-27 DIAGNOSIS — N2581 Secondary hyperparathyroidism of renal origin: Secondary | ICD-10-CM | POA: Diagnosis not present

## 2019-11-27 DIAGNOSIS — N186 End stage renal disease: Secondary | ICD-10-CM | POA: Diagnosis not present

## 2019-11-30 DIAGNOSIS — Z992 Dependence on renal dialysis: Secondary | ICD-10-CM | POA: Diagnosis not present

## 2019-11-30 DIAGNOSIS — N186 End stage renal disease: Secondary | ICD-10-CM | POA: Diagnosis not present

## 2019-11-30 DIAGNOSIS — N2581 Secondary hyperparathyroidism of renal origin: Secondary | ICD-10-CM | POA: Diagnosis not present

## 2019-12-02 DIAGNOSIS — N186 End stage renal disease: Secondary | ICD-10-CM | POA: Diagnosis not present

## 2019-12-02 DIAGNOSIS — Z992 Dependence on renal dialysis: Secondary | ICD-10-CM | POA: Diagnosis not present

## 2019-12-02 DIAGNOSIS — N2581 Secondary hyperparathyroidism of renal origin: Secondary | ICD-10-CM | POA: Diagnosis not present

## 2019-12-04 DIAGNOSIS — N2581 Secondary hyperparathyroidism of renal origin: Secondary | ICD-10-CM | POA: Diagnosis not present

## 2019-12-04 DIAGNOSIS — N186 End stage renal disease: Secondary | ICD-10-CM | POA: Diagnosis not present

## 2019-12-04 DIAGNOSIS — Z992 Dependence on renal dialysis: Secondary | ICD-10-CM | POA: Diagnosis not present

## 2019-12-07 DIAGNOSIS — N186 End stage renal disease: Secondary | ICD-10-CM | POA: Diagnosis not present

## 2019-12-07 DIAGNOSIS — N2581 Secondary hyperparathyroidism of renal origin: Secondary | ICD-10-CM | POA: Diagnosis not present

## 2019-12-07 DIAGNOSIS — Z992 Dependence on renal dialysis: Secondary | ICD-10-CM | POA: Diagnosis not present

## 2019-12-09 DIAGNOSIS — N186 End stage renal disease: Secondary | ICD-10-CM | POA: Diagnosis not present

## 2019-12-09 DIAGNOSIS — N2581 Secondary hyperparathyroidism of renal origin: Secondary | ICD-10-CM | POA: Diagnosis not present

## 2019-12-09 DIAGNOSIS — Z992 Dependence on renal dialysis: Secondary | ICD-10-CM | POA: Diagnosis not present

## 2019-12-11 DIAGNOSIS — Z992 Dependence on renal dialysis: Secondary | ICD-10-CM | POA: Diagnosis not present

## 2019-12-11 DIAGNOSIS — N186 End stage renal disease: Secondary | ICD-10-CM | POA: Diagnosis not present

## 2019-12-11 DIAGNOSIS — N2581 Secondary hyperparathyroidism of renal origin: Secondary | ICD-10-CM | POA: Diagnosis not present

## 2019-12-14 DIAGNOSIS — N2581 Secondary hyperparathyroidism of renal origin: Secondary | ICD-10-CM | POA: Diagnosis not present

## 2019-12-14 DIAGNOSIS — Z992 Dependence on renal dialysis: Secondary | ICD-10-CM | POA: Diagnosis not present

## 2019-12-14 DIAGNOSIS — N186 End stage renal disease: Secondary | ICD-10-CM | POA: Diagnosis not present

## 2019-12-15 ENCOUNTER — Ambulatory Visit: Payer: Medicare PPO | Admitting: Podiatry

## 2019-12-16 DIAGNOSIS — I120 Hypertensive chronic kidney disease with stage 5 chronic kidney disease or end stage renal disease: Secondary | ICD-10-CM | POA: Diagnosis not present

## 2019-12-16 DIAGNOSIS — N2581 Secondary hyperparathyroidism of renal origin: Secondary | ICD-10-CM | POA: Diagnosis not present

## 2019-12-16 DIAGNOSIS — N186 End stage renal disease: Secondary | ICD-10-CM | POA: Diagnosis not present

## 2019-12-16 DIAGNOSIS — Z992 Dependence on renal dialysis: Secondary | ICD-10-CM | POA: Diagnosis not present

## 2019-12-18 DIAGNOSIS — N2581 Secondary hyperparathyroidism of renal origin: Secondary | ICD-10-CM | POA: Diagnosis not present

## 2019-12-18 DIAGNOSIS — N186 End stage renal disease: Secondary | ICD-10-CM | POA: Diagnosis not present

## 2019-12-18 DIAGNOSIS — Z992 Dependence on renal dialysis: Secondary | ICD-10-CM | POA: Diagnosis not present

## 2019-12-21 DIAGNOSIS — Z992 Dependence on renal dialysis: Secondary | ICD-10-CM | POA: Diagnosis not present

## 2019-12-21 DIAGNOSIS — N2581 Secondary hyperparathyroidism of renal origin: Secondary | ICD-10-CM | POA: Diagnosis not present

## 2019-12-21 DIAGNOSIS — N186 End stage renal disease: Secondary | ICD-10-CM | POA: Diagnosis not present

## 2019-12-23 DIAGNOSIS — Z992 Dependence on renal dialysis: Secondary | ICD-10-CM | POA: Diagnosis not present

## 2019-12-23 DIAGNOSIS — N2581 Secondary hyperparathyroidism of renal origin: Secondary | ICD-10-CM | POA: Diagnosis not present

## 2019-12-23 DIAGNOSIS — N186 End stage renal disease: Secondary | ICD-10-CM | POA: Diagnosis not present

## 2019-12-25 DIAGNOSIS — N186 End stage renal disease: Secondary | ICD-10-CM | POA: Diagnosis not present

## 2019-12-25 DIAGNOSIS — Z992 Dependence on renal dialysis: Secondary | ICD-10-CM | POA: Diagnosis not present

## 2019-12-25 DIAGNOSIS — N2581 Secondary hyperparathyroidism of renal origin: Secondary | ICD-10-CM | POA: Diagnosis not present

## 2019-12-28 DIAGNOSIS — N2581 Secondary hyperparathyroidism of renal origin: Secondary | ICD-10-CM | POA: Diagnosis not present

## 2019-12-28 DIAGNOSIS — N186 End stage renal disease: Secondary | ICD-10-CM | POA: Diagnosis not present

## 2019-12-28 DIAGNOSIS — Z992 Dependence on renal dialysis: Secondary | ICD-10-CM | POA: Diagnosis not present

## 2019-12-30 DIAGNOSIS — Z992 Dependence on renal dialysis: Secondary | ICD-10-CM | POA: Diagnosis not present

## 2019-12-30 DIAGNOSIS — N2581 Secondary hyperparathyroidism of renal origin: Secondary | ICD-10-CM | POA: Diagnosis not present

## 2019-12-30 DIAGNOSIS — N186 End stage renal disease: Secondary | ICD-10-CM | POA: Diagnosis not present

## 2020-01-01 DIAGNOSIS — N2581 Secondary hyperparathyroidism of renal origin: Secondary | ICD-10-CM | POA: Diagnosis not present

## 2020-01-01 DIAGNOSIS — N186 End stage renal disease: Secondary | ICD-10-CM | POA: Diagnosis not present

## 2020-01-01 DIAGNOSIS — Z992 Dependence on renal dialysis: Secondary | ICD-10-CM | POA: Diagnosis not present

## 2020-01-03 DIAGNOSIS — M542 Cervicalgia: Secondary | ICD-10-CM | POA: Diagnosis not present

## 2020-01-04 DIAGNOSIS — Z992 Dependence on renal dialysis: Secondary | ICD-10-CM | POA: Diagnosis not present

## 2020-01-04 DIAGNOSIS — N186 End stage renal disease: Secondary | ICD-10-CM | POA: Diagnosis not present

## 2020-01-04 DIAGNOSIS — N2581 Secondary hyperparathyroidism of renal origin: Secondary | ICD-10-CM | POA: Diagnosis not present

## 2020-01-06 DIAGNOSIS — Z992 Dependence on renal dialysis: Secondary | ICD-10-CM | POA: Diagnosis not present

## 2020-01-06 DIAGNOSIS — N186 End stage renal disease: Secondary | ICD-10-CM | POA: Diagnosis not present

## 2020-01-06 DIAGNOSIS — N2581 Secondary hyperparathyroidism of renal origin: Secondary | ICD-10-CM | POA: Diagnosis not present

## 2020-01-08 DIAGNOSIS — N186 End stage renal disease: Secondary | ICD-10-CM | POA: Diagnosis not present

## 2020-01-08 DIAGNOSIS — N2581 Secondary hyperparathyroidism of renal origin: Secondary | ICD-10-CM | POA: Diagnosis not present

## 2020-01-08 DIAGNOSIS — Z992 Dependence on renal dialysis: Secondary | ICD-10-CM | POA: Diagnosis not present

## 2020-01-11 DIAGNOSIS — N186 End stage renal disease: Secondary | ICD-10-CM | POA: Diagnosis not present

## 2020-01-11 DIAGNOSIS — N2581 Secondary hyperparathyroidism of renal origin: Secondary | ICD-10-CM | POA: Diagnosis not present

## 2020-01-11 DIAGNOSIS — Z992 Dependence on renal dialysis: Secondary | ICD-10-CM | POA: Diagnosis not present

## 2020-01-13 DIAGNOSIS — N186 End stage renal disease: Secondary | ICD-10-CM | POA: Diagnosis not present

## 2020-01-13 DIAGNOSIS — Z992 Dependence on renal dialysis: Secondary | ICD-10-CM | POA: Diagnosis not present

## 2020-01-13 DIAGNOSIS — N2581 Secondary hyperparathyroidism of renal origin: Secondary | ICD-10-CM | POA: Diagnosis not present

## 2020-01-14 DIAGNOSIS — R627 Adult failure to thrive: Secondary | ICD-10-CM | POA: Diagnosis not present

## 2020-01-14 DIAGNOSIS — R269 Unspecified abnormalities of gait and mobility: Secondary | ICD-10-CM | POA: Diagnosis not present

## 2020-01-14 DIAGNOSIS — M5416 Radiculopathy, lumbar region: Secondary | ICD-10-CM | POA: Diagnosis not present

## 2020-01-14 DIAGNOSIS — M1990 Unspecified osteoarthritis, unspecified site: Secondary | ICD-10-CM | POA: Diagnosis not present

## 2020-01-14 DIAGNOSIS — M4800 Spinal stenosis, site unspecified: Secondary | ICD-10-CM | POA: Diagnosis not present

## 2020-01-14 DIAGNOSIS — M75102 Unspecified rotator cuff tear or rupture of left shoulder, not specified as traumatic: Secondary | ICD-10-CM | POA: Diagnosis not present

## 2020-01-14 DIAGNOSIS — M5136 Other intervertebral disc degeneration, lumbar region: Secondary | ICD-10-CM | POA: Diagnosis not present

## 2020-01-14 DIAGNOSIS — R52 Pain, unspecified: Secondary | ICD-10-CM | POA: Diagnosis not present

## 2020-01-14 DIAGNOSIS — R634 Abnormal weight loss: Secondary | ICD-10-CM | POA: Diagnosis not present

## 2020-01-15 DIAGNOSIS — N186 End stage renal disease: Secondary | ICD-10-CM | POA: Diagnosis not present

## 2020-01-15 DIAGNOSIS — N2581 Secondary hyperparathyroidism of renal origin: Secondary | ICD-10-CM | POA: Diagnosis not present

## 2020-01-15 DIAGNOSIS — Z992 Dependence on renal dialysis: Secondary | ICD-10-CM | POA: Diagnosis not present

## 2020-01-16 DIAGNOSIS — N186 End stage renal disease: Secondary | ICD-10-CM | POA: Diagnosis not present

## 2020-01-16 DIAGNOSIS — I120 Hypertensive chronic kidney disease with stage 5 chronic kidney disease or end stage renal disease: Secondary | ICD-10-CM | POA: Diagnosis not present

## 2020-01-16 DIAGNOSIS — Z992 Dependence on renal dialysis: Secondary | ICD-10-CM | POA: Diagnosis not present

## 2020-01-18 DIAGNOSIS — N186 End stage renal disease: Secondary | ICD-10-CM | POA: Diagnosis not present

## 2020-01-18 DIAGNOSIS — Z992 Dependence on renal dialysis: Secondary | ICD-10-CM | POA: Diagnosis not present

## 2020-01-18 DIAGNOSIS — N2581 Secondary hyperparathyroidism of renal origin: Secondary | ICD-10-CM | POA: Diagnosis not present

## 2020-01-20 DIAGNOSIS — N186 End stage renal disease: Secondary | ICD-10-CM | POA: Diagnosis not present

## 2020-01-20 DIAGNOSIS — N2581 Secondary hyperparathyroidism of renal origin: Secondary | ICD-10-CM | POA: Diagnosis not present

## 2020-01-20 DIAGNOSIS — Z992 Dependence on renal dialysis: Secondary | ICD-10-CM | POA: Diagnosis not present

## 2020-01-21 DIAGNOSIS — Z961 Presence of intraocular lens: Secondary | ICD-10-CM | POA: Diagnosis not present

## 2020-01-21 DIAGNOSIS — H401133 Primary open-angle glaucoma, bilateral, severe stage: Secondary | ICD-10-CM | POA: Diagnosis not present

## 2020-01-21 DIAGNOSIS — H52222 Regular astigmatism, left eye: Secondary | ICD-10-CM | POA: Diagnosis not present

## 2020-01-21 DIAGNOSIS — H04123 Dry eye syndrome of bilateral lacrimal glands: Secondary | ICD-10-CM | POA: Diagnosis not present

## 2020-01-21 DIAGNOSIS — H43813 Vitreous degeneration, bilateral: Secondary | ICD-10-CM | POA: Diagnosis not present

## 2020-01-21 DIAGNOSIS — H524 Presbyopia: Secondary | ICD-10-CM | POA: Diagnosis not present

## 2020-01-21 DIAGNOSIS — H5212 Myopia, left eye: Secondary | ICD-10-CM | POA: Diagnosis not present

## 2020-01-22 DIAGNOSIS — N186 End stage renal disease: Secondary | ICD-10-CM | POA: Diagnosis not present

## 2020-01-22 DIAGNOSIS — N2581 Secondary hyperparathyroidism of renal origin: Secondary | ICD-10-CM | POA: Diagnosis not present

## 2020-01-22 DIAGNOSIS — Z992 Dependence on renal dialysis: Secondary | ICD-10-CM | POA: Diagnosis not present

## 2020-01-24 DIAGNOSIS — F17210 Nicotine dependence, cigarettes, uncomplicated: Secondary | ICD-10-CM | POA: Diagnosis not present

## 2020-01-24 DIAGNOSIS — M47812 Spondylosis without myelopathy or radiculopathy, cervical region: Secondary | ICD-10-CM | POA: Diagnosis not present

## 2020-01-24 DIAGNOSIS — Z716 Tobacco abuse counseling: Secondary | ICD-10-CM | POA: Diagnosis not present

## 2020-01-25 DIAGNOSIS — Z992 Dependence on renal dialysis: Secondary | ICD-10-CM | POA: Diagnosis not present

## 2020-01-25 DIAGNOSIS — N2581 Secondary hyperparathyroidism of renal origin: Secondary | ICD-10-CM | POA: Diagnosis not present

## 2020-01-25 DIAGNOSIS — N186 End stage renal disease: Secondary | ICD-10-CM | POA: Diagnosis not present

## 2020-01-26 DIAGNOSIS — M4800 Spinal stenosis, site unspecified: Secondary | ICD-10-CM | POA: Diagnosis not present

## 2020-01-26 DIAGNOSIS — M1990 Unspecified osteoarthritis, unspecified site: Secondary | ICD-10-CM | POA: Diagnosis not present

## 2020-01-26 DIAGNOSIS — N186 End stage renal disease: Secondary | ICD-10-CM | POA: Diagnosis not present

## 2020-01-26 DIAGNOSIS — I350 Nonrheumatic aortic (valve) stenosis: Secondary | ICD-10-CM | POA: Diagnosis not present

## 2020-01-26 DIAGNOSIS — I120 Hypertensive chronic kidney disease with stage 5 chronic kidney disease or end stage renal disease: Secondary | ICD-10-CM | POA: Diagnosis not present

## 2020-01-26 DIAGNOSIS — M75102 Unspecified rotator cuff tear or rupture of left shoulder, not specified as traumatic: Secondary | ICD-10-CM | POA: Diagnosis not present

## 2020-01-26 DIAGNOSIS — M5116 Intervertebral disc disorders with radiculopathy, lumbar region: Secondary | ICD-10-CM | POA: Diagnosis not present

## 2020-01-26 DIAGNOSIS — D631 Anemia in chronic kidney disease: Secondary | ICD-10-CM | POA: Diagnosis not present

## 2020-01-26 DIAGNOSIS — R634 Abnormal weight loss: Secondary | ICD-10-CM | POA: Diagnosis not present

## 2020-01-27 DIAGNOSIS — N186 End stage renal disease: Secondary | ICD-10-CM | POA: Diagnosis not present

## 2020-01-27 DIAGNOSIS — N2581 Secondary hyperparathyroidism of renal origin: Secondary | ICD-10-CM | POA: Diagnosis not present

## 2020-01-27 DIAGNOSIS — Z992 Dependence on renal dialysis: Secondary | ICD-10-CM | POA: Diagnosis not present

## 2020-01-29 DIAGNOSIS — Z992 Dependence on renal dialysis: Secondary | ICD-10-CM | POA: Diagnosis not present

## 2020-01-29 DIAGNOSIS — N2581 Secondary hyperparathyroidism of renal origin: Secondary | ICD-10-CM | POA: Diagnosis not present

## 2020-01-29 DIAGNOSIS — N186 End stage renal disease: Secondary | ICD-10-CM | POA: Diagnosis not present

## 2020-01-31 ENCOUNTER — Other Ambulatory Visit: Payer: Medicare PPO | Admitting: Hospice

## 2020-01-31 ENCOUNTER — Other Ambulatory Visit: Payer: Self-pay

## 2020-01-31 DIAGNOSIS — I350 Nonrheumatic aortic (valve) stenosis: Secondary | ICD-10-CM | POA: Diagnosis not present

## 2020-01-31 DIAGNOSIS — M4800 Spinal stenosis, site unspecified: Secondary | ICD-10-CM | POA: Diagnosis not present

## 2020-01-31 DIAGNOSIS — N186 End stage renal disease: Secondary | ICD-10-CM | POA: Diagnosis not present

## 2020-01-31 DIAGNOSIS — I120 Hypertensive chronic kidney disease with stage 5 chronic kidney disease or end stage renal disease: Secondary | ICD-10-CM | POA: Diagnosis not present

## 2020-01-31 DIAGNOSIS — R634 Abnormal weight loss: Secondary | ICD-10-CM | POA: Diagnosis not present

## 2020-01-31 DIAGNOSIS — Z992 Dependence on renal dialysis: Secondary | ICD-10-CM

## 2020-01-31 DIAGNOSIS — M1990 Unspecified osteoarthritis, unspecified site: Secondary | ICD-10-CM | POA: Diagnosis not present

## 2020-01-31 DIAGNOSIS — D631 Anemia in chronic kidney disease: Secondary | ICD-10-CM | POA: Diagnosis not present

## 2020-01-31 DIAGNOSIS — Z515 Encounter for palliative care: Secondary | ICD-10-CM | POA: Diagnosis not present

## 2020-01-31 DIAGNOSIS — M5116 Intervertebral disc disorders with radiculopathy, lumbar region: Secondary | ICD-10-CM | POA: Diagnosis not present

## 2020-01-31 DIAGNOSIS — M75102 Unspecified rotator cuff tear or rupture of left shoulder, not specified as traumatic: Secondary | ICD-10-CM | POA: Diagnosis not present

## 2020-01-31 NOTE — Progress Notes (Signed)
[facility] Consult Note Telephone: [phone]  Fax: [phone]  PATIENT NAME: [name] DOB: [birth year] MRN: [id number]  PRIMARY CARE PROVIDER:   Janie Morning, DO Janie Morning, DO [address] [place],  [place] [address]  REFERRING PROVIDER: Janie Morning, DO Janie Morning, [place] [address] [place],  [place] [address]  RESPONSIBLE PARTY:Self [phone] Emergency Contact; Daughter [name] [address] [place] VISIT STATEMENT Due to the COVID-19 crisis, this visit was done via telephone from my office. It was initiated and consented to by this patient and/or family.   RECOMMENDATIONS/PLAN:   Advance Care Planning:  Visit consisted of building trust and discussions on Palliative Medicine as specialized medical care for people living with serious illness, aimed at facilitating better quality of life through symptoms relief, assisting with advance care plan and establishing goals of care. NP called Elmyra Ricks and updated her on visit; she expressed appreciation for the call.  Code Status: CODE STATUS reviewed.  Patient affirmed she remains a FULL code.   She said she tired of going of dialysis but will still continue to for now. She is still open to consider change of her code status in the future when her health warrants it.  Visit consisted of counseling and education dealing with the complex and emotionally intense issues of symptom management and palliative care in the setting of serious and potentially life-threatening illness. Palliative care team will continue to support patient, patient's family, and medical team.  Goals of Care: Goals of care include to maximize quality of life and symptom management.  Follow up: Palliative care will continue to follow patient for goals of care clarification and symptom management.  Next visit in 2 months or as needed.  Symptom  management: Pain: Patient reported worsening pain related to her arthritis. She affirmed she just took [place] and it is effective; she will go for cortisone shots next week 02/08/2020 from Orthopedic Dr. Mina Marble.  She denied cough, dyspnea, hospitalizations since last visit.  No COPD exacerbation. Continue with breathing treatments as ordered. Smoking cessation reiterated.,  Dialysis for Tuesdays Thursdays and Saturdays; well-tolerated; she has been doing dialysis for > 30 years. She reports improved appetite.   Palliative will continue to monitor for symptom management/decline and make recommendations as needed. Family /Caregiver/Community Supports: Patient lives at home with family.   I spent 46 minutes providing this consultation; time includes time spent with patient/family, chart review, provider coordination,  and documentation. More than 50% of the time in this consultation was spent on coordinating communication   HISTORY OF PRESENT ILLNESS:Jenna L Jeffersonis a 69 y.o.year oldfemalewith multiple medical problems including ESRD with hemodialysis, arthritis, Seizure, Hep C,COPD. Palliative Care was asked to help address goals of care.    CODE STATUS: Full  PPS: 40%  HOSPICE ELIGIBILITY/DIAGNOSIS: TBD  PAST MEDICAL HISTORY:  Past Medical History:  Diagnosis Date  . Anemia    anemia of chronic disease likely 2/2 ESRD per last anemia panel (02/2011) with Fe 35, TIBC 167, ferritin 2041  // BL Hgb 8-10  . Anuria    Due to dialysis  . Aortic stenosis, mild    03/2011 echo  . Arthritis    Back  . Blood transfusion 1990's   r/t Kidney removal surgery  . Brain aneurysm    No records could be found  . Colitis, ischemic ([place]) 01/2011   S/P partial colectomy of right hemicolon and ileostomy placement  .  Diabetes mellitus    Borderline  . Dialysis patient [facility])    [place] Tues,Thursday, saturday  . Dysrhythmia    SVT for brief period in Feb 2013  . End  stage renal disease on dialysis [facility])    Secondary to hypertension // T/Th/Sat dialysis on [employer]  . Foot drop, bilateral    after back surgery  . Heart murmur    Born with heart mumur, does not require follow up per pt  . Hepatitis C   . Hypertension    Does not see a heart doctor, had pre transplant stress test at [facility]    . Lumbar spinal stenosis     bilateral L4-5 and L5-S1 posterior lumbar interbody arthrodesis and bilateral L4-S1 posterior lateral arthrodesis for lumbar stenosis dynamic lumbar spondylolisthesis lumbar spondylosis lumbar radiculopathy with foot drop // s/p surgery 01/2011 - see surgery section for details.  . Seizures ([place])    r/t HTN in 1990's x 1  . Umbilical hernia age 89    SOCIAL HX:  Social History   Tobacco Use  . Smoking status: Current Every Day Smoker    Packs/day: 0.50    Years: 48.00    Pack years: 24.00    Types: Cigarettes  . Smokeless tobacco: Never Used  . Tobacco comment: 6 cigarettes per day  Substance Use Topics  . Alcohol use: Yes    Alcohol/week: 3.0 - 4.0 standard drinks    Types: 3 - 4 Shots of liquor per week    Comment: 3-4 shots of liquor on Fridays    ALLERGIES:  Allergies  Allergen Reactions  . Iodinated Diagnostic Agents Other (See Comments)    Seziure Other reaction(s): Other (See Comments) Seziure   . Hydrocodone Nausea And Vomiting  . Metoprolol Nausea And Vomiting  . Other Other (See Comments)  . Iohexol Other (See Comments)    Reaction is convulsions     PERTINENT MEDICATIONS:  Outpatient Encounter Medications as of 01/31/2020  Medication Sig  . albuterol (VENTOLIN HFA) 108 (90 Base) MCG/ACT inhaler Inhale 2 puffs into the lungs every 4 (four) hours as needed for wheezing or shortness of breath.  Marland Kitchen amLODipine (NORVASC) 10 MG tablet Take by mouth.  . cetirizine (ZYRTEC ALLERGY) 10 MG tablet Take 1 tablet (10 mg total) by mouth daily.  . diphenhydrAMINE (BENADRYL) 25 MG tablet Take 50 mg by mouth  daily as needed (cold symptoms).  . fluticasone (FLONASE) 50 MCG/ACT nasal spray Place 1 spray into both nostrils daily.  . Ibuprofen 200 MG CAPS Take 400 mg by mouth every 6 (six) hours as needed for headache or moderate pain.   Marland Kitchen oxyCODONE-acetaminophen (PERCOCET) 10-325 MG tablet Take 0.5 tablets by mouth 2 (two) times daily as needed.  . predniSONE (DELTASONE) 50 MG tablet Take 1 tablet (50 mg total) by mouth daily with breakfast.   No facility-administered encounter medications on file as of 01/31/2020.    Teodoro Spray, NP

## 2020-02-01 DIAGNOSIS — N2581 Secondary hyperparathyroidism of renal origin: Secondary | ICD-10-CM | POA: Diagnosis not present

## 2020-02-01 DIAGNOSIS — N186 End stage renal disease: Secondary | ICD-10-CM | POA: Diagnosis not present

## 2020-02-01 DIAGNOSIS — Z992 Dependence on renal dialysis: Secondary | ICD-10-CM | POA: Diagnosis not present

## 2020-02-02 DIAGNOSIS — D631 Anemia in chronic kidney disease: Secondary | ICD-10-CM | POA: Diagnosis not present

## 2020-02-02 DIAGNOSIS — R634 Abnormal weight loss: Secondary | ICD-10-CM | POA: Diagnosis not present

## 2020-02-02 DIAGNOSIS — I120 Hypertensive chronic kidney disease with stage 5 chronic kidney disease or end stage renal disease: Secondary | ICD-10-CM | POA: Diagnosis not present

## 2020-02-02 DIAGNOSIS — M1990 Unspecified osteoarthritis, unspecified site: Secondary | ICD-10-CM | POA: Diagnosis not present

## 2020-02-02 DIAGNOSIS — M5116 Intervertebral disc disorders with radiculopathy, lumbar region: Secondary | ICD-10-CM | POA: Diagnosis not present

## 2020-02-02 DIAGNOSIS — I350 Nonrheumatic aortic (valve) stenosis: Secondary | ICD-10-CM | POA: Diagnosis not present

## 2020-02-02 DIAGNOSIS — M75102 Unspecified rotator cuff tear or rupture of left shoulder, not specified as traumatic: Secondary | ICD-10-CM | POA: Diagnosis not present

## 2020-02-02 DIAGNOSIS — M4800 Spinal stenosis, site unspecified: Secondary | ICD-10-CM | POA: Diagnosis not present

## 2020-02-02 DIAGNOSIS — N186 End stage renal disease: Secondary | ICD-10-CM | POA: Diagnosis not present

## 2020-02-03 DIAGNOSIS — N186 End stage renal disease: Secondary | ICD-10-CM | POA: Diagnosis not present

## 2020-02-03 DIAGNOSIS — N2581 Secondary hyperparathyroidism of renal origin: Secondary | ICD-10-CM | POA: Diagnosis not present

## 2020-02-03 DIAGNOSIS — Z992 Dependence on renal dialysis: Secondary | ICD-10-CM | POA: Diagnosis not present

## 2020-02-07 DIAGNOSIS — N186 End stage renal disease: Secondary | ICD-10-CM | POA: Diagnosis not present

## 2020-02-07 DIAGNOSIS — Z992 Dependence on renal dialysis: Secondary | ICD-10-CM | POA: Diagnosis not present

## 2020-02-07 DIAGNOSIS — N2581 Secondary hyperparathyroidism of renal origin: Secondary | ICD-10-CM | POA: Diagnosis not present

## 2020-02-08 DIAGNOSIS — M25512 Pain in left shoulder: Secondary | ICD-10-CM | POA: Diagnosis not present

## 2020-02-09 DIAGNOSIS — Z992 Dependence on renal dialysis: Secondary | ICD-10-CM | POA: Diagnosis not present

## 2020-02-09 DIAGNOSIS — N186 End stage renal disease: Secondary | ICD-10-CM | POA: Diagnosis not present

## 2020-02-09 DIAGNOSIS — N2581 Secondary hyperparathyroidism of renal origin: Secondary | ICD-10-CM | POA: Diagnosis not present

## 2020-02-12 DIAGNOSIS — N186 End stage renal disease: Secondary | ICD-10-CM | POA: Diagnosis not present

## 2020-02-12 DIAGNOSIS — N2581 Secondary hyperparathyroidism of renal origin: Secondary | ICD-10-CM | POA: Diagnosis not present

## 2020-02-12 DIAGNOSIS — Z992 Dependence on renal dialysis: Secondary | ICD-10-CM | POA: Diagnosis not present

## 2020-02-14 DIAGNOSIS — I350 Nonrheumatic aortic (valve) stenosis: Secondary | ICD-10-CM | POA: Diagnosis not present

## 2020-02-14 DIAGNOSIS — M4800 Spinal stenosis, site unspecified: Secondary | ICD-10-CM | POA: Diagnosis not present

## 2020-02-14 DIAGNOSIS — M75102 Unspecified rotator cuff tear or rupture of left shoulder, not specified as traumatic: Secondary | ICD-10-CM | POA: Diagnosis not present

## 2020-02-14 DIAGNOSIS — M5116 Intervertebral disc disorders with radiculopathy, lumbar region: Secondary | ICD-10-CM | POA: Diagnosis not present

## 2020-02-14 DIAGNOSIS — D631 Anemia in chronic kidney disease: Secondary | ICD-10-CM | POA: Diagnosis not present

## 2020-02-14 DIAGNOSIS — R634 Abnormal weight loss: Secondary | ICD-10-CM | POA: Diagnosis not present

## 2020-02-14 DIAGNOSIS — N186 End stage renal disease: Secondary | ICD-10-CM | POA: Diagnosis not present

## 2020-02-14 DIAGNOSIS — M1990 Unspecified osteoarthritis, unspecified site: Secondary | ICD-10-CM | POA: Diagnosis not present

## 2020-02-14 DIAGNOSIS — I120 Hypertensive chronic kidney disease with stage 5 chronic kidney disease or end stage renal disease: Secondary | ICD-10-CM | POA: Diagnosis not present

## 2020-02-15 DIAGNOSIS — Z992 Dependence on renal dialysis: Secondary | ICD-10-CM | POA: Diagnosis not present

## 2020-02-15 DIAGNOSIS — I120 Hypertensive chronic kidney disease with stage 5 chronic kidney disease or end stage renal disease: Secondary | ICD-10-CM | POA: Diagnosis not present

## 2020-02-15 DIAGNOSIS — N186 End stage renal disease: Secondary | ICD-10-CM | POA: Diagnosis not present

## 2020-02-16 DIAGNOSIS — I350 Nonrheumatic aortic (valve) stenosis: Secondary | ICD-10-CM | POA: Diagnosis not present

## 2020-02-16 DIAGNOSIS — I120 Hypertensive chronic kidney disease with stage 5 chronic kidney disease or end stage renal disease: Secondary | ICD-10-CM | POA: Diagnosis not present

## 2020-02-16 DIAGNOSIS — M4800 Spinal stenosis, site unspecified: Secondary | ICD-10-CM | POA: Diagnosis not present

## 2020-02-16 DIAGNOSIS — Z992 Dependence on renal dialysis: Secondary | ICD-10-CM | POA: Diagnosis not present

## 2020-02-16 DIAGNOSIS — N2581 Secondary hyperparathyroidism of renal origin: Secondary | ICD-10-CM | POA: Diagnosis not present

## 2020-02-16 DIAGNOSIS — N186 End stage renal disease: Secondary | ICD-10-CM | POA: Diagnosis not present

## 2020-02-17 DIAGNOSIS — Z992 Dependence on renal dialysis: Secondary | ICD-10-CM | POA: Diagnosis not present

## 2020-02-17 DIAGNOSIS — N186 End stage renal disease: Secondary | ICD-10-CM | POA: Diagnosis not present

## 2020-02-17 DIAGNOSIS — N2581 Secondary hyperparathyroidism of renal origin: Secondary | ICD-10-CM | POA: Diagnosis not present

## 2020-02-19 DIAGNOSIS — N2581 Secondary hyperparathyroidism of renal origin: Secondary | ICD-10-CM | POA: Diagnosis not present

## 2020-02-19 DIAGNOSIS — Z992 Dependence on renal dialysis: Secondary | ICD-10-CM | POA: Diagnosis not present

## 2020-02-19 DIAGNOSIS — N186 End stage renal disease: Secondary | ICD-10-CM | POA: Diagnosis not present

## 2020-02-22 DIAGNOSIS — N186 End stage renal disease: Secondary | ICD-10-CM | POA: Diagnosis not present

## 2020-02-22 DIAGNOSIS — Z992 Dependence on renal dialysis: Secondary | ICD-10-CM | POA: Diagnosis not present

## 2020-02-22 DIAGNOSIS — N2581 Secondary hyperparathyroidism of renal origin: Secondary | ICD-10-CM | POA: Diagnosis not present

## 2020-02-24 DIAGNOSIS — N2581 Secondary hyperparathyroidism of renal origin: Secondary | ICD-10-CM | POA: Diagnosis not present

## 2020-02-24 DIAGNOSIS — N186 End stage renal disease: Secondary | ICD-10-CM | POA: Diagnosis not present

## 2020-02-24 DIAGNOSIS — Z992 Dependence on renal dialysis: Secondary | ICD-10-CM | POA: Diagnosis not present

## 2020-02-25 DIAGNOSIS — M1990 Unspecified osteoarthritis, unspecified site: Secondary | ICD-10-CM | POA: Diagnosis not present

## 2020-02-25 DIAGNOSIS — D631 Anemia in chronic kidney disease: Secondary | ICD-10-CM | POA: Diagnosis not present

## 2020-02-25 DIAGNOSIS — N186 End stage renal disease: Secondary | ICD-10-CM | POA: Diagnosis not present

## 2020-02-25 DIAGNOSIS — I120 Hypertensive chronic kidney disease with stage 5 chronic kidney disease or end stage renal disease: Secondary | ICD-10-CM | POA: Diagnosis not present

## 2020-02-25 DIAGNOSIS — M75102 Unspecified rotator cuff tear or rupture of left shoulder, not specified as traumatic: Secondary | ICD-10-CM | POA: Diagnosis not present

## 2020-02-25 DIAGNOSIS — M5116 Intervertebral disc disorders with radiculopathy, lumbar region: Secondary | ICD-10-CM | POA: Diagnosis not present

## 2020-02-25 DIAGNOSIS — R634 Abnormal weight loss: Secondary | ICD-10-CM | POA: Diagnosis not present

## 2020-02-25 DIAGNOSIS — I350 Nonrheumatic aortic (valve) stenosis: Secondary | ICD-10-CM | POA: Diagnosis not present

## 2020-02-25 DIAGNOSIS — M4800 Spinal stenosis, site unspecified: Secondary | ICD-10-CM | POA: Diagnosis not present

## 2020-02-26 DIAGNOSIS — N2581 Secondary hyperparathyroidism of renal origin: Secondary | ICD-10-CM | POA: Diagnosis not present

## 2020-02-26 DIAGNOSIS — N186 End stage renal disease: Secondary | ICD-10-CM | POA: Diagnosis not present

## 2020-02-26 DIAGNOSIS — Z992 Dependence on renal dialysis: Secondary | ICD-10-CM | POA: Diagnosis not present

## 2020-02-29 DIAGNOSIS — N2581 Secondary hyperparathyroidism of renal origin: Secondary | ICD-10-CM | POA: Diagnosis not present

## 2020-02-29 DIAGNOSIS — N186 End stage renal disease: Secondary | ICD-10-CM | POA: Diagnosis not present

## 2020-02-29 DIAGNOSIS — Z992 Dependence on renal dialysis: Secondary | ICD-10-CM | POA: Diagnosis not present

## 2020-03-01 DIAGNOSIS — I350 Nonrheumatic aortic (valve) stenosis: Secondary | ICD-10-CM | POA: Diagnosis not present

## 2020-03-01 DIAGNOSIS — N186 End stage renal disease: Secondary | ICD-10-CM | POA: Diagnosis not present

## 2020-03-01 DIAGNOSIS — M1990 Unspecified osteoarthritis, unspecified site: Secondary | ICD-10-CM | POA: Diagnosis not present

## 2020-03-01 DIAGNOSIS — D631 Anemia in chronic kidney disease: Secondary | ICD-10-CM | POA: Diagnosis not present

## 2020-03-01 DIAGNOSIS — R634 Abnormal weight loss: Secondary | ICD-10-CM | POA: Diagnosis not present

## 2020-03-01 DIAGNOSIS — I120 Hypertensive chronic kidney disease with stage 5 chronic kidney disease or end stage renal disease: Secondary | ICD-10-CM | POA: Diagnosis not present

## 2020-03-01 DIAGNOSIS — M75102 Unspecified rotator cuff tear or rupture of left shoulder, not specified as traumatic: Secondary | ICD-10-CM | POA: Diagnosis not present

## 2020-03-01 DIAGNOSIS — M4800 Spinal stenosis, site unspecified: Secondary | ICD-10-CM | POA: Diagnosis not present

## 2020-03-01 DIAGNOSIS — M5116 Intervertebral disc disorders with radiculopathy, lumbar region: Secondary | ICD-10-CM | POA: Diagnosis not present

## 2020-03-02 DIAGNOSIS — N186 End stage renal disease: Secondary | ICD-10-CM | POA: Diagnosis not present

## 2020-03-02 DIAGNOSIS — N2581 Secondary hyperparathyroidism of renal origin: Secondary | ICD-10-CM | POA: Diagnosis not present

## 2020-03-02 DIAGNOSIS — Z992 Dependence on renal dialysis: Secondary | ICD-10-CM | POA: Diagnosis not present

## 2020-03-04 DIAGNOSIS — Z992 Dependence on renal dialysis: Secondary | ICD-10-CM | POA: Diagnosis not present

## 2020-03-04 DIAGNOSIS — N2581 Secondary hyperparathyroidism of renal origin: Secondary | ICD-10-CM | POA: Diagnosis not present

## 2020-03-04 DIAGNOSIS — N186 End stage renal disease: Secondary | ICD-10-CM | POA: Diagnosis not present

## 2020-03-06 DIAGNOSIS — D631 Anemia in chronic kidney disease: Secondary | ICD-10-CM | POA: Diagnosis not present

## 2020-03-06 DIAGNOSIS — M75102 Unspecified rotator cuff tear or rupture of left shoulder, not specified as traumatic: Secondary | ICD-10-CM | POA: Diagnosis not present

## 2020-03-06 DIAGNOSIS — N186 End stage renal disease: Secondary | ICD-10-CM | POA: Diagnosis not present

## 2020-03-06 DIAGNOSIS — R634 Abnormal weight loss: Secondary | ICD-10-CM | POA: Diagnosis not present

## 2020-03-06 DIAGNOSIS — M5116 Intervertebral disc disorders with radiculopathy, lumbar region: Secondary | ICD-10-CM | POA: Diagnosis not present

## 2020-03-06 DIAGNOSIS — I120 Hypertensive chronic kidney disease with stage 5 chronic kidney disease or end stage renal disease: Secondary | ICD-10-CM | POA: Diagnosis not present

## 2020-03-06 DIAGNOSIS — I350 Nonrheumatic aortic (valve) stenosis: Secondary | ICD-10-CM | POA: Diagnosis not present

## 2020-03-06 DIAGNOSIS — M1990 Unspecified osteoarthritis, unspecified site: Secondary | ICD-10-CM | POA: Diagnosis not present

## 2020-03-06 DIAGNOSIS — M4800 Spinal stenosis, site unspecified: Secondary | ICD-10-CM | POA: Diagnosis not present

## 2020-03-07 DIAGNOSIS — N2581 Secondary hyperparathyroidism of renal origin: Secondary | ICD-10-CM | POA: Diagnosis not present

## 2020-03-07 DIAGNOSIS — Z992 Dependence on renal dialysis: Secondary | ICD-10-CM | POA: Diagnosis not present

## 2020-03-07 DIAGNOSIS — N186 End stage renal disease: Secondary | ICD-10-CM | POA: Diagnosis not present

## 2020-03-08 DIAGNOSIS — M75102 Unspecified rotator cuff tear or rupture of left shoulder, not specified as traumatic: Secondary | ICD-10-CM | POA: Diagnosis not present

## 2020-03-08 DIAGNOSIS — M5116 Intervertebral disc disorders with radiculopathy, lumbar region: Secondary | ICD-10-CM | POA: Diagnosis not present

## 2020-03-08 DIAGNOSIS — I350 Nonrheumatic aortic (valve) stenosis: Secondary | ICD-10-CM | POA: Diagnosis not present

## 2020-03-08 DIAGNOSIS — D631 Anemia in chronic kidney disease: Secondary | ICD-10-CM | POA: Diagnosis not present

## 2020-03-08 DIAGNOSIS — I120 Hypertensive chronic kidney disease with stage 5 chronic kidney disease or end stage renal disease: Secondary | ICD-10-CM | POA: Diagnosis not present

## 2020-03-08 DIAGNOSIS — M4800 Spinal stenosis, site unspecified: Secondary | ICD-10-CM | POA: Diagnosis not present

## 2020-03-08 DIAGNOSIS — M1990 Unspecified osteoarthritis, unspecified site: Secondary | ICD-10-CM | POA: Diagnosis not present

## 2020-03-08 DIAGNOSIS — R634 Abnormal weight loss: Secondary | ICD-10-CM | POA: Diagnosis not present

## 2020-03-08 DIAGNOSIS — N186 End stage renal disease: Secondary | ICD-10-CM | POA: Diagnosis not present

## 2020-03-09 DIAGNOSIS — N2581 Secondary hyperparathyroidism of renal origin: Secondary | ICD-10-CM | POA: Diagnosis not present

## 2020-03-09 DIAGNOSIS — Z992 Dependence on renal dialysis: Secondary | ICD-10-CM | POA: Diagnosis not present

## 2020-03-09 DIAGNOSIS — N186 End stage renal disease: Secondary | ICD-10-CM | POA: Diagnosis not present

## 2020-03-12 DIAGNOSIS — N2581 Secondary hyperparathyroidism of renal origin: Secondary | ICD-10-CM | POA: Diagnosis not present

## 2020-03-12 DIAGNOSIS — Z992 Dependence on renal dialysis: Secondary | ICD-10-CM | POA: Diagnosis not present

## 2020-03-12 DIAGNOSIS — N186 End stage renal disease: Secondary | ICD-10-CM | POA: Diagnosis not present

## 2020-03-15 DIAGNOSIS — M5116 Intervertebral disc disorders with radiculopathy, lumbar region: Secondary | ICD-10-CM | POA: Diagnosis not present

## 2020-03-15 DIAGNOSIS — M4800 Spinal stenosis, site unspecified: Secondary | ICD-10-CM | POA: Diagnosis not present

## 2020-03-15 DIAGNOSIS — I120 Hypertensive chronic kidney disease with stage 5 chronic kidney disease or end stage renal disease: Secondary | ICD-10-CM | POA: Diagnosis not present

## 2020-03-15 DIAGNOSIS — D631 Anemia in chronic kidney disease: Secondary | ICD-10-CM | POA: Diagnosis not present

## 2020-03-15 DIAGNOSIS — I350 Nonrheumatic aortic (valve) stenosis: Secondary | ICD-10-CM | POA: Diagnosis not present

## 2020-03-15 DIAGNOSIS — R634 Abnormal weight loss: Secondary | ICD-10-CM | POA: Diagnosis not present

## 2020-03-15 DIAGNOSIS — M1990 Unspecified osteoarthritis, unspecified site: Secondary | ICD-10-CM | POA: Diagnosis not present

## 2020-03-15 DIAGNOSIS — M75102 Unspecified rotator cuff tear or rupture of left shoulder, not specified as traumatic: Secondary | ICD-10-CM | POA: Diagnosis not present

## 2020-03-15 DIAGNOSIS — N186 End stage renal disease: Secondary | ICD-10-CM | POA: Diagnosis not present

## 2020-03-16 DIAGNOSIS — N2581 Secondary hyperparathyroidism of renal origin: Secondary | ICD-10-CM | POA: Diagnosis not present

## 2020-03-16 DIAGNOSIS — N186 End stage renal disease: Secondary | ICD-10-CM | POA: Diagnosis not present

## 2020-03-16 DIAGNOSIS — Z992 Dependence on renal dialysis: Secondary | ICD-10-CM | POA: Diagnosis not present

## 2020-03-17 DIAGNOSIS — N186 End stage renal disease: Secondary | ICD-10-CM | POA: Diagnosis not present

## 2020-03-17 DIAGNOSIS — Z992 Dependence on renal dialysis: Secondary | ICD-10-CM | POA: Diagnosis not present

## 2020-03-17 DIAGNOSIS — I120 Hypertensive chronic kidney disease with stage 5 chronic kidney disease or end stage renal disease: Secondary | ICD-10-CM | POA: Diagnosis not present

## 2020-03-19 DIAGNOSIS — N186 End stage renal disease: Secondary | ICD-10-CM | POA: Diagnosis not present

## 2020-03-19 DIAGNOSIS — N2581 Secondary hyperparathyroidism of renal origin: Secondary | ICD-10-CM | POA: Diagnosis not present

## 2020-03-19 DIAGNOSIS — Z992 Dependence on renal dialysis: Secondary | ICD-10-CM | POA: Diagnosis not present

## 2020-03-21 DIAGNOSIS — Z992 Dependence on renal dialysis: Secondary | ICD-10-CM | POA: Diagnosis not present

## 2020-03-21 DIAGNOSIS — N2581 Secondary hyperparathyroidism of renal origin: Secondary | ICD-10-CM | POA: Diagnosis not present

## 2020-03-21 DIAGNOSIS — N186 End stage renal disease: Secondary | ICD-10-CM | POA: Diagnosis not present

## 2020-03-23 DIAGNOSIS — N186 End stage renal disease: Secondary | ICD-10-CM | POA: Diagnosis not present

## 2020-03-23 DIAGNOSIS — Z992 Dependence on renal dialysis: Secondary | ICD-10-CM | POA: Diagnosis not present

## 2020-03-23 DIAGNOSIS — N2581 Secondary hyperparathyroidism of renal origin: Secondary | ICD-10-CM | POA: Diagnosis not present

## 2020-03-24 DIAGNOSIS — I350 Nonrheumatic aortic (valve) stenosis: Secondary | ICD-10-CM | POA: Diagnosis not present

## 2020-03-24 DIAGNOSIS — M5116 Intervertebral disc disorders with radiculopathy, lumbar region: Secondary | ICD-10-CM | POA: Diagnosis not present

## 2020-03-24 DIAGNOSIS — N186 End stage renal disease: Secondary | ICD-10-CM | POA: Diagnosis not present

## 2020-03-24 DIAGNOSIS — M75102 Unspecified rotator cuff tear or rupture of left shoulder, not specified as traumatic: Secondary | ICD-10-CM | POA: Diagnosis not present

## 2020-03-24 DIAGNOSIS — R634 Abnormal weight loss: Secondary | ICD-10-CM | POA: Diagnosis not present

## 2020-03-24 DIAGNOSIS — M1990 Unspecified osteoarthritis, unspecified site: Secondary | ICD-10-CM | POA: Diagnosis not present

## 2020-03-24 DIAGNOSIS — I120 Hypertensive chronic kidney disease with stage 5 chronic kidney disease or end stage renal disease: Secondary | ICD-10-CM | POA: Diagnosis not present

## 2020-03-24 DIAGNOSIS — M4800 Spinal stenosis, site unspecified: Secondary | ICD-10-CM | POA: Diagnosis not present

## 2020-03-24 DIAGNOSIS — D631 Anemia in chronic kidney disease: Secondary | ICD-10-CM | POA: Diagnosis not present

## 2020-03-25 DIAGNOSIS — N186 End stage renal disease: Secondary | ICD-10-CM | POA: Diagnosis not present

## 2020-03-25 DIAGNOSIS — Z992 Dependence on renal dialysis: Secondary | ICD-10-CM | POA: Diagnosis not present

## 2020-03-25 DIAGNOSIS — N2581 Secondary hyperparathyroidism of renal origin: Secondary | ICD-10-CM | POA: Diagnosis not present

## 2020-03-28 DIAGNOSIS — N186 End stage renal disease: Secondary | ICD-10-CM | POA: Diagnosis not present

## 2020-03-28 DIAGNOSIS — Z992 Dependence on renal dialysis: Secondary | ICD-10-CM | POA: Diagnosis not present

## 2020-03-28 DIAGNOSIS — N2581 Secondary hyperparathyroidism of renal origin: Secondary | ICD-10-CM | POA: Diagnosis not present

## 2020-03-30 DIAGNOSIS — Z992 Dependence on renal dialysis: Secondary | ICD-10-CM | POA: Diagnosis not present

## 2020-03-30 DIAGNOSIS — N186 End stage renal disease: Secondary | ICD-10-CM | POA: Diagnosis not present

## 2020-03-30 DIAGNOSIS — N2581 Secondary hyperparathyroidism of renal origin: Secondary | ICD-10-CM | POA: Diagnosis not present

## 2020-04-01 DIAGNOSIS — N2581 Secondary hyperparathyroidism of renal origin: Secondary | ICD-10-CM | POA: Diagnosis not present

## 2020-04-01 DIAGNOSIS — Z992 Dependence on renal dialysis: Secondary | ICD-10-CM | POA: Diagnosis not present

## 2020-04-01 DIAGNOSIS — N186 End stage renal disease: Secondary | ICD-10-CM | POA: Diagnosis not present

## 2020-04-06 DIAGNOSIS — Z992 Dependence on renal dialysis: Secondary | ICD-10-CM | POA: Diagnosis not present

## 2020-04-06 DIAGNOSIS — N186 End stage renal disease: Secondary | ICD-10-CM | POA: Diagnosis not present

## 2020-04-06 DIAGNOSIS — N2581 Secondary hyperparathyroidism of renal origin: Secondary | ICD-10-CM | POA: Diagnosis not present

## 2020-04-08 DIAGNOSIS — N186 End stage renal disease: Secondary | ICD-10-CM | POA: Diagnosis not present

## 2020-04-08 DIAGNOSIS — N2581 Secondary hyperparathyroidism of renal origin: Secondary | ICD-10-CM | POA: Diagnosis not present

## 2020-04-08 DIAGNOSIS — Z992 Dependence on renal dialysis: Secondary | ICD-10-CM | POA: Diagnosis not present

## 2020-04-10 ENCOUNTER — Other Ambulatory Visit: Payer: Medicare PPO | Admitting: Hospice

## 2020-04-10 ENCOUNTER — Other Ambulatory Visit: Payer: Self-pay

## 2020-04-10 DIAGNOSIS — M25512 Pain in left shoulder: Secondary | ICD-10-CM

## 2020-04-10 DIAGNOSIS — Z515 Encounter for palliative care: Secondary | ICD-10-CM

## 2020-04-10 NOTE — Progress Notes (Signed)
[facility] Pall[facility] Consult Note Telephone: [phone]  Fax: [phone]  PATIENT NAME: [name] DOB: [birth year] MRN: [id number]  PRIMARY CARE PROVIDER:   Janie Morning, DO Janie Morning, DO [address] [place],  [place] [address]  REFERRING PROVIDER: Janie Morning, DO Janie Morning, [place] [address] [place],  [place] [address]  RESPONSIBLE PARTY:Self [id number] Emergency Contact; Daughter [name] 64 [place] VISIT STATEMENT Due to the COVID-19 crisis, this visit was done via telemedicine from my office. It was initiated and consented to by this patient and/or family.   Visit is to build trust and highlight Palliative Medicine as specialized medical care for people living with serious illness, aimed at facilitating better quality of life through symptoms relief, assisting with advance care plan and establishing goals of care.   CHIEF COMPLAINT: Follow up palliative visit/pain in left shoulder  RECOMMENDATIONS/PLAN:   1. Advance Care Planning/Code Status: CODE STATUS reviewed. Patient reiterated she is a full code and wants everything done to keep her alive.  2. GOALS OF CARE: Goals of care include to maximize quality of life and symptom management.   I spent 20 minutes providing this consultation. More than 50% of the time in this consultation was spent in counseling and care coordination. ____________________________________________________________________  3. Symptom management:  Pain in left shoulder: Patient reports aching pain in left shoulder 5 out of 10 on a pain scale, chronic, comes and goes, likely worsened with recent cold weather. She says she continues to use her oxycodone which is helpful.  Recommendation: Use of heating pad, ibuprofen 400 mg 3 times daily as needed for pain. No COPD exacerbation, denies shortness of breath. She continues on dialysis Tuesdays  Thursdays and Saturdays, well-tolerated.  Palliative will continue to monitor for symptom management/decline and make recommendations as needed. Return 2 months or prn. Encouraged to call provider sooner with any concerns.  HISTORY OF PRESENT ILLNESS:  [name] is a 70 y.o. year old female with multiple medical problems including left shoulder pain - aching pain in left shoulder 5 out of 10 on a pain scale, chronic, comes and goes, likely worsened with recent cold weather. She says she continues to use her oxycodone which is helpful. Patient has history of end-stage renal disease, currently on dialysis, COPD, seizure, hep C.  Palliative Care was asked to help address goals of care. Rest of 10 point ROS asked and negative.  CODE STATUS: full code  PPS: 40%  HOSPICE ELIGIBILITY/DIAGNOSIS: TBD  PAST MEDICAL HISTORY:  Past Medical History:  Diagnosis Date  . Anemia    anemia of chronic disease likely 2/2 ESRD per last anemia panel (02/2011) with Fe 35, TIBC 167, ferritin 2041  // BL Hgb 8-10  . Anuria    Due to dialysis  . Aortic stenosis, mild    03/2011 echo  . Arthritis    Back  . Blood transfusion 1990's   r/t Kidney removal surgery  . Brain aneurysm    No records could be found  . Colitis, ischemic ([place]) 01/2011   S/P partial colectomy of right hemicolon and ileostomy placement  . Diabetes mellitus    Borderline  . Dialysis patient [facility])    [place] Tues,Thursday, saturday  . Dysrhythmia    SVT for brief period in Feb 2013  . End stage renal disease on dialysis [facility])    Secondary to hypertension // T/Th/Sat dialysis on [employer]  .  Foot drop, bilateral    after back surgery  . Heart murmur    Born with heart mumur, does not require follow up per pt  . Hepatitis C   . Hypertension    Does not see a heart doctor, had pre transplant stress test at [facility]    . Lumbar spinal stenosis     bilateral L4-5 and L5-S1 posterior lumbar interbody arthrodesis and  bilateral L4-S1 posterior lateral arthrodesis for lumbar stenosis dynamic lumbar spondylolisthesis lumbar spondylosis lumbar radiculopathy with foot drop // s/p surgery 01/2011 - see surgery section for details.  . Seizures ([place])    r/t HTN in 1990's x 1  . Umbilical hernia age 70    SOCIAL HX: '@SOCX'$   ALLERGIES:  Allergies  Allergen Reactions  . Iodinated Diagnostic Agents Other (See Comments)    Seziure Other reaction(s): Other (See Comments) Seziure   . Hydrocodone Nausea And Vomiting  . Metoprolol Nausea And Vomiting  . Other Other (See Comments)  . Iohexol Other (See Comments)    Reaction is convulsions     PERTINENT MEDICATIONS:  Outpatient Encounter Medications as of 04/10/2020  Medication Sig  . albuterol (VENTOLIN HFA) 108 (90 Base) MCG/ACT inhaler Inhale 2 puffs into the lungs every 4 (four) hours as needed for wheezing or shortness of breath.  Marland Kitchen amLODipine (NORVASC) 10 MG tablet Take by mouth.  . cetirizine (ZYRTEC ALLERGY) 10 MG tablet Take 1 tablet (10 mg total) by mouth daily.  . diphenhydrAMINE (BENADRYL) 25 MG tablet Take 50 mg by mouth daily as needed (cold symptoms).  . fluticasone (FLONASE) 50 MCG/ACT nasal spray Place 1 spray into both nostrils daily.  . Ibuprofen 200 MG CAPS Take 400 mg by mouth every 6 (six) hours as needed for headache or moderate pain.   Marland Kitchen oxyCODONE-acetaminophen (PERCOCET) 10-325 MG tablet Take 0.5 tablets by mouth 2 (two) times daily as needed.  . predniSONE (DELTASONE) 50 MG tablet Take 1 tablet (50 mg total) by mouth daily with breakfast.   No facility-administered encounter medications on file as of 04/10/2020.   Physical exam deferred due to telemedicine.  Palliative Care was asked to follow this patient by consultation request of Janie Morning, DO to help address advance care planning and complex decision making. Thank you for the opportunity to participate in the care of [name] Please call our office at [phone] if we  can be of additional assistance.  Note: Portions of this note were generated with Lobbyist. Dictation errors may occur despite best attempts at proofreading.  Teodoro Spray, NP

## 2020-04-11 DIAGNOSIS — N2581 Secondary hyperparathyroidism of renal origin: Secondary | ICD-10-CM | POA: Diagnosis not present

## 2020-04-11 DIAGNOSIS — N186 End stage renal disease: Secondary | ICD-10-CM | POA: Diagnosis not present

## 2020-04-11 DIAGNOSIS — Z992 Dependence on renal dialysis: Secondary | ICD-10-CM | POA: Diagnosis not present

## 2020-04-13 DIAGNOSIS — N186 End stage renal disease: Secondary | ICD-10-CM | POA: Diagnosis not present

## 2020-04-13 DIAGNOSIS — N2581 Secondary hyperparathyroidism of renal origin: Secondary | ICD-10-CM | POA: Diagnosis not present

## 2020-04-13 DIAGNOSIS — Z992 Dependence on renal dialysis: Secondary | ICD-10-CM | POA: Diagnosis not present

## 2020-04-17 ENCOUNTER — Other Ambulatory Visit (HOSPITAL_COMMUNITY): Payer: Self-pay | Admitting: Physical Medicine and Rehabilitation

## 2020-04-17 ENCOUNTER — Other Ambulatory Visit: Payer: Self-pay | Admitting: Physical Medicine and Rehabilitation

## 2020-04-17 DIAGNOSIS — M25512 Pain in left shoulder: Secondary | ICD-10-CM

## 2020-04-17 DIAGNOSIS — N186 End stage renal disease: Secondary | ICD-10-CM | POA: Diagnosis not present

## 2020-04-17 DIAGNOSIS — Z992 Dependence on renal dialysis: Secondary | ICD-10-CM | POA: Diagnosis not present

## 2020-04-17 DIAGNOSIS — I120 Hypertensive chronic kidney disease with stage 5 chronic kidney disease or end stage renal disease: Secondary | ICD-10-CM | POA: Diagnosis not present

## 2020-04-18 DIAGNOSIS — N186 End stage renal disease: Secondary | ICD-10-CM | POA: Diagnosis not present

## 2020-04-18 DIAGNOSIS — N2581 Secondary hyperparathyroidism of renal origin: Secondary | ICD-10-CM | POA: Diagnosis not present

## 2020-04-18 DIAGNOSIS — Z992 Dependence on renal dialysis: Secondary | ICD-10-CM | POA: Diagnosis not present

## 2020-04-20 DIAGNOSIS — N186 End stage renal disease: Secondary | ICD-10-CM | POA: Diagnosis not present

## 2020-04-20 DIAGNOSIS — N2581 Secondary hyperparathyroidism of renal origin: Secondary | ICD-10-CM | POA: Diagnosis not present

## 2020-04-20 DIAGNOSIS — Z992 Dependence on renal dialysis: Secondary | ICD-10-CM | POA: Diagnosis not present

## 2020-04-22 DIAGNOSIS — N2581 Secondary hyperparathyroidism of renal origin: Secondary | ICD-10-CM | POA: Diagnosis not present

## 2020-04-22 DIAGNOSIS — N186 End stage renal disease: Secondary | ICD-10-CM | POA: Diagnosis not present

## 2020-04-22 DIAGNOSIS — Z992 Dependence on renal dialysis: Secondary | ICD-10-CM | POA: Diagnosis not present

## 2020-04-25 DIAGNOSIS — N2581 Secondary hyperparathyroidism of renal origin: Secondary | ICD-10-CM | POA: Diagnosis not present

## 2020-04-25 DIAGNOSIS — N186 End stage renal disease: Secondary | ICD-10-CM | POA: Diagnosis not present

## 2020-04-25 DIAGNOSIS — Z992 Dependence on renal dialysis: Secondary | ICD-10-CM | POA: Diagnosis not present

## 2020-04-27 DIAGNOSIS — N2581 Secondary hyperparathyroidism of renal origin: Secondary | ICD-10-CM | POA: Diagnosis not present

## 2020-04-27 DIAGNOSIS — N186 End stage renal disease: Secondary | ICD-10-CM | POA: Diagnosis not present

## 2020-04-27 DIAGNOSIS — Z992 Dependence on renal dialysis: Secondary | ICD-10-CM | POA: Diagnosis not present

## 2020-04-29 DIAGNOSIS — N186 End stage renal disease: Secondary | ICD-10-CM | POA: Diagnosis not present

## 2020-04-29 DIAGNOSIS — N2581 Secondary hyperparathyroidism of renal origin: Secondary | ICD-10-CM | POA: Diagnosis not present

## 2020-04-29 DIAGNOSIS — Z992 Dependence on renal dialysis: Secondary | ICD-10-CM | POA: Diagnosis not present

## 2020-05-01 ENCOUNTER — Other Ambulatory Visit: Payer: Self-pay

## 2020-05-01 ENCOUNTER — Ambulatory Visit (HOSPITAL_COMMUNITY)
Admission: RE | Admit: 2020-05-01 | Discharge: 2020-05-01 | Disposition: A | Discharge status: Home / Self Care | Payer: Medicare PPO | Source: Ambulatory Visit | Attending: Physical Medicine and Rehabilitation | Admitting: Physical Medicine and Rehabilitation

## 2020-05-01 DIAGNOSIS — M25512 Pain in left shoulder: Secondary | ICD-10-CM

## 2020-05-01 DIAGNOSIS — M19012 Primary osteoarthritis, left shoulder: Secondary | ICD-10-CM | POA: Diagnosis not present

## 2020-05-01 DIAGNOSIS — M75122 Complete rotator cuff tear or rupture of left shoulder, not specified as traumatic: Secondary | ICD-10-CM | POA: Diagnosis not present

## 2020-05-01 DIAGNOSIS — M67814 Other specified disorders of tendon, left shoulder: Secondary | ICD-10-CM | POA: Diagnosis not present

## 2020-05-01 NOTE — Progress Notes (Signed)
Limited MRI of the left shoulder completed. Patient terminated the procedure due to pain and refused to continue. Sag T2 FS NOT completed.

## 2020-05-04 DIAGNOSIS — N2581 Secondary hyperparathyroidism of renal origin: Secondary | ICD-10-CM | POA: Diagnosis not present

## 2020-05-04 DIAGNOSIS — Z992 Dependence on renal dialysis: Secondary | ICD-10-CM | POA: Diagnosis not present

## 2020-05-04 DIAGNOSIS — N186 End stage renal disease: Secondary | ICD-10-CM | POA: Diagnosis not present

## 2020-05-06 DIAGNOSIS — N186 End stage renal disease: Secondary | ICD-10-CM | POA: Diagnosis not present

## 2020-05-06 DIAGNOSIS — N2581 Secondary hyperparathyroidism of renal origin: Secondary | ICD-10-CM | POA: Diagnosis not present

## 2020-05-06 DIAGNOSIS — Z992 Dependence on renal dialysis: Secondary | ICD-10-CM | POA: Diagnosis not present

## 2020-05-09 DIAGNOSIS — Z992 Dependence on renal dialysis: Secondary | ICD-10-CM | POA: Diagnosis not present

## 2020-05-09 DIAGNOSIS — N2581 Secondary hyperparathyroidism of renal origin: Secondary | ICD-10-CM | POA: Diagnosis not present

## 2020-05-09 DIAGNOSIS — N186 End stage renal disease: Secondary | ICD-10-CM | POA: Diagnosis not present

## 2020-05-12 DIAGNOSIS — R269 Unspecified abnormalities of gait and mobility: Secondary | ICD-10-CM | POA: Diagnosis not present

## 2020-05-12 DIAGNOSIS — M21372 Foot drop, left foot: Secondary | ICD-10-CM | POA: Diagnosis not present

## 2020-05-12 DIAGNOSIS — G894 Chronic pain syndrome: Secondary | ICD-10-CM | POA: Diagnosis not present

## 2020-05-12 DIAGNOSIS — M21371 Foot drop, right foot: Secondary | ICD-10-CM | POA: Diagnosis not present

## 2020-05-12 DIAGNOSIS — K029 Dental caries, unspecified: Secondary | ICD-10-CM | POA: Diagnosis not present

## 2020-05-12 DIAGNOSIS — M75102 Unspecified rotator cuff tear or rupture of left shoulder, not specified as traumatic: Secondary | ICD-10-CM | POA: Diagnosis not present

## 2020-05-12 DIAGNOSIS — F329 Major depressive disorder, single episode, unspecified: Secondary | ICD-10-CM | POA: Diagnosis not present

## 2020-05-12 DIAGNOSIS — M4800 Spinal stenosis, site unspecified: Secondary | ICD-10-CM | POA: Diagnosis not present

## 2020-05-12 DIAGNOSIS — M5136 Other intervertebral disc degeneration, lumbar region: Secondary | ICD-10-CM | POA: Diagnosis not present

## 2020-05-13 DIAGNOSIS — N186 End stage renal disease: Secondary | ICD-10-CM | POA: Diagnosis not present

## 2020-05-13 DIAGNOSIS — N2581 Secondary hyperparathyroidism of renal origin: Secondary | ICD-10-CM | POA: Diagnosis not present

## 2020-05-13 DIAGNOSIS — Z992 Dependence on renal dialysis: Secondary | ICD-10-CM | POA: Diagnosis not present

## 2020-05-15 DIAGNOSIS — N186 End stage renal disease: Secondary | ICD-10-CM | POA: Diagnosis not present

## 2020-05-15 DIAGNOSIS — M47812 Spondylosis without myelopathy or radiculopathy, cervical region: Secondary | ICD-10-CM | POA: Diagnosis not present

## 2020-05-15 DIAGNOSIS — M25512 Pain in left shoulder: Secondary | ICD-10-CM | POA: Diagnosis not present

## 2020-05-15 DIAGNOSIS — I120 Hypertensive chronic kidney disease with stage 5 chronic kidney disease or end stage renal disease: Secondary | ICD-10-CM | POA: Diagnosis not present

## 2020-05-15 DIAGNOSIS — Z992 Dependence on renal dialysis: Secondary | ICD-10-CM | POA: Diagnosis not present

## 2020-05-16 DIAGNOSIS — Z992 Dependence on renal dialysis: Secondary | ICD-10-CM | POA: Diagnosis not present

## 2020-05-16 DIAGNOSIS — N2581 Secondary hyperparathyroidism of renal origin: Secondary | ICD-10-CM | POA: Diagnosis not present

## 2020-05-16 DIAGNOSIS — N186 End stage renal disease: Secondary | ICD-10-CM | POA: Diagnosis not present

## 2020-05-18 DIAGNOSIS — N2581 Secondary hyperparathyroidism of renal origin: Secondary | ICD-10-CM | POA: Diagnosis not present

## 2020-05-18 DIAGNOSIS — Z992 Dependence on renal dialysis: Secondary | ICD-10-CM | POA: Diagnosis not present

## 2020-05-18 DIAGNOSIS — N186 End stage renal disease: Secondary | ICD-10-CM | POA: Diagnosis not present

## 2020-05-23 ENCOUNTER — Telehealth: Payer: Self-pay

## 2020-05-23 DIAGNOSIS — Z992 Dependence on renal dialysis: Secondary | ICD-10-CM | POA: Diagnosis not present

## 2020-05-23 DIAGNOSIS — N2581 Secondary hyperparathyroidism of renal origin: Secondary | ICD-10-CM | POA: Diagnosis not present

## 2020-05-23 DIAGNOSIS — N186 End stage renal disease: Secondary | ICD-10-CM | POA: Diagnosis not present

## 2020-05-23 NOTE — Telephone Encounter (Cosign Needed)
Received message that patient needs to have a visit scheduled with Palliative NP. Message left for daughter, Elmyra Ricks. Scheduled for 05/24/2020 @ 11am. Requested return call from [place] to confirm this.

## 2020-05-24 ENCOUNTER — Other Ambulatory Visit: Payer: Self-pay

## 2020-05-24 ENCOUNTER — Other Ambulatory Visit: Payer: Medicare PPO | Admitting: Hospice

## 2020-05-24 DIAGNOSIS — Z515 Encounter for palliative care: Secondary | ICD-10-CM | POA: Diagnosis not present

## 2020-05-24 DIAGNOSIS — M25531 Pain in right wrist: Secondary | ICD-10-CM | POA: Diagnosis not present

## 2020-05-24 DIAGNOSIS — M25532 Pain in left wrist: Secondary | ICD-10-CM | POA: Diagnosis not present

## 2020-05-24 DIAGNOSIS — N186 End stage renal disease: Secondary | ICD-10-CM | POA: Diagnosis not present

## 2020-05-24 DIAGNOSIS — Z992 Dependence on renal dialysis: Secondary | ICD-10-CM | POA: Diagnosis not present

## 2020-05-24 NOTE — Addendum Note (Signed)
Addended by: Laverda Sorenson I on: 05/24/2020 07:44 PM   Modules accepted: Level of Service

## 2020-05-24 NOTE — Progress Notes (Signed)
[place] Consult Note Telephone: [phone]  Fax: [phone]  PATIENT NAME: [name] DOB: 1950-12-07 MRN: [id number]  PRIMARY CARE PROVIDER:   Janie Morning, DO Janie Morning, DO [address] [place],  [place] [address]  REFERRING PROVIDER: Janie Morning, DO Janie Morning, [place],  [place] [address]  RESPONSIBLE PARTY:Self [id number] Emergency Contact; Daughter Cindra Eves [id number]   Visit is to build trust and highlight Palliative Medicine as specialized medical care for people living with serious illness, aimed at facilitating better quality of life through symptoms relief, assisting with advance care plan and establishing goals of care.   CHIEF COMPLAINT: Palliative focused visit/neuropathic pain  RECOMMENDATIONS/PLAN:   1. Advance Care Planning/Code Status:Code status reviewed today. Patient affirmed she is a Full code  2. Goals of Care: Goals of care include to maximize quality of life and symptom management. Patient wishes to continue with dialysis and be able to carry on her ADLs as independently as possible.   Visit consisted of counseling and education dealing with the complex and emotionally intense issues of symptom management and palliative care in the setting of serious and potentially life-threatening illness. Palliative care team will continue to support patient, patient's family, and medical team.  I spent 20  minutes providing this consultation. More than 50% of the time in this consultation was spent on coordinating communication.  -------------------------------------------------------------------------------------------------------------------------------------------------- 3. Symptom management/Plan:  Neuropathic pain in bilateral hands. Recommendations: Gabapentin '200mg'$  BID. Compromised self care/Caregiver situation identified. NP Called and left  message  with Elmyra Ricks in PCP office, on Gabapentin recommendation and need for Home Health nursing aide to help patient with ADLs. Palliative will continue to monitor for symptom management/decline and make recommendations as needed. Return 2 months or prn. Encouraged to call provider sooner with any concerns.   HISTORY OF PRESENT ILLNESS:  [name] is a 70 y.o. female with multiple medical problems including pain in bilateral hands which she described as 'numbing and burning and tingling', acute on chronic, comes and goes and worsened in the last 3 days. Pain is a 7 out of 10;  wakes her up in the morning. She took '1000mg'$  Tylenol during visit - effective. She said she is currently on oxycodone which is also helpful; the burning tingling persists. This impairs her activities of daily living and independence. History obtained from review of EMR, discussion with patient/family.   Review and summarization of Epic records shows history from other than patient. Rest of 10 point ROS asked and negative.  Palliative Care was asked to follow this patient by consultation request of Janie Morning, DO to help address complex decision making in the context of advance care planning and goals of care clarification.   CODE STATUS: Full code  PPS: 40%  HOSPICE ELIGIBILITY/DIAGNOSIS: TBD  PAST MEDICAL HISTORY:  Past Medical History:  Diagnosis Date  . Anemia    anemia of chronic disease likely 2/2 ESRD per last anemia panel (02/2011) with Fe 35, TIBC 167, ferritin 2041  // BL Hgb 8-10  . Anuria    Due to dialysis  . Aortic stenosis, mild    03/2011 echo  . Arthritis    Back  . Blood transfusion 1990's   r/t Kidney removal surgery  . Brain aneurysm    No records could be found  . Colitis, ischemic ([place]) 01/2011   S/P partial colectomy of right hemicolon and ileostomy placement  . Diabetes  mellitus    Borderline  . Dialysis patient [facility])    [place] Tues,Thursday, saturday  .  Dysrhythmia    SVT for brief period in Feb 2013  . End stage renal disease on dialysis [facility])    Secondary to hypertension // T/Th/Sat dialysis on [employer]  . Foot drop, bilateral    after back surgery  . Heart murmur    Born with heart mumur, does not require follow up per pt  . Hepatitis C   . Hypertension    Does not see a heart doctor, had pre transplant [facility] test at [facility]    . Lumbar spinal stenosis     bilateral L4-5 and L5-S1 posterior lumbar interbody arthrodesis and bilateral L4-S1 posterior lateral arthrodesis for lumbar stenosis dynamic lumbar spondylolisthesis lumbar spondylosis lumbar radiculopathy with foot drop // s/p surgery 01/2011 - see surgery section for details.  . Seizures ([place])    r/t HTN in 1990's x 1  . Umbilical hernia age 81    SOCIAL HX: '@SOCX'$  Patient athome   for ongoing care   FAMILY HX:  Family History  Problem Relation Age of Onset  . Diabetes Father   . Stroke Mother   . COPD Brother   . Heart attack Brother   . Bronchitis Sister   . Anesthesia problems Neg Hx   . Hypotension Neg Hx   . Malignant hyperthermia Neg Hx   . Pseudochol deficiency Neg Hx     Review lab tests/diagnostics No results for input(s): WBC, HGB, HCT, PLT, MCV in the last 168 hours. No results for input(s): NA, K, CL, CO2, BUN, CREATININE, GLUCOSE in the last 168 hours. Latest GFR by Cockcroft Gault (not valid in AKI or ESRD) CrCl cannot be calculated (Patient's most recent lab result is older than the maximum 21 days allowed.). No results for input(s): AST, ALT, ALKPHOS, GGT in the last 168 hours.  Invalid input(s): TBILI, CONJBILI, ALB, TOTALPROTEIN No components found for: ALB No results for input(s): APTT, INR in the last 168 hours.  Invalid input(s): PTPATIENT No results for input(s): BNP, PROBNP in the last 168 hours.  ALLERGIES:  Allergies  Allergen Reactions  . Iodinated Diagnostic Agents Other (See Comments)    Seziure Other reaction(s): Other  (See Comments) Seziure   . Hydrocodone Nausea And Vomiting  . Metoprolol Nausea And Vomiting  . Other Other (See Comments)  . Iohexol Other (See Comments)    Reaction is convulsions      PERTINENT MEDICATIONS:  Outpatient Encounter Medications as of 05/24/2020  Medication Sig  . albuterol (VENTOLIN HFA) 108 (90 Base) MCG/ACT inhaler Inhale 2 puffs into the lungs every 4 (four) hours as needed for wheezing or shortness of breath.  Marland Kitchen amLODipine (NORVASC) 10 MG tablet Take by mouth.  . cetirizine (ZYRTEC ALLERGY) 10 MG tablet Take 1 tablet (10 mg total) by mouth daily.  . diphenhydrAMINE (BENADRYL) 25 MG tablet Take 50 mg by mouth daily as needed (cold symptoms).  . fluticasone (FLONASE) 50 MCG/ACT nasal spray Place 1 spray into both nostrils daily.  . Ibuprofen 200 MG CAPS Take 400 mg by mouth every 6 (six) hours as needed for headache or moderate pain.   Marland Kitchen oxyCODONE-acetaminophen (PERCOCET) 10-325 MG tablet Take 0.5 tablets by mouth 2 (two) times daily as needed.  . predniSONE (DELTASONE) 50 MG tablet Take 1 tablet (50 mg total) by mouth daily with breakfast.   No facility-administered encounter medications on file as of 05/24/2020.  ROS  General: NAD EYES: denies vision changes ENMT: denies dysphagia no xerostomia Cardiovascular: denies chest pain Pulmonary: denies  cough, denies SOB  Abdomen: endorses fair appetite, no constipation or diarrhea GU: denies dysuria or urinary frequency MSK:  endorses ROM limitations, numbing tingling burning in hands Skin: denies rashes or wounds Neurological: endorses weakness, endorses pain, denies insomnia Psych: Endorses positive mood Heme/lymph/immuno: denies bruises, abnormal bleeding   PHYSICAL EXAM  General: In no acute distress,  Cardiovascular: regular rate and rhythm; non pitting edema to BLE Pulmonary: no cough, no increased work of breathing, normal respiratory effort Abdomen: soft, non tender, positive bowel sounds in all  quadrants GU:  no suprapubic tenderness Eyes: Normal lids, no discharge, sclera anicteric ENMT: Moist mucous membranes Musculoskeletal:  no joint deformity, moderate sarcopenia Skin: no rash to visible skin, warm without cyanosis Psych: non-anxious affect Neurological: Weakness but otherwise non focal Heme/lymph/immuno: no bruises, no bleeding  Thank you for the opportunity to participate in the care of [name] Please call our office at [phone] if we can be of additional assistance.  Note: Portions of this note were generated with Lobbyist. Dictation errors may occur despite best attempts at proofreading.  Teodoro Spray, NP

## 2020-05-25 ENCOUNTER — Other Ambulatory Visit: Payer: Self-pay

## 2020-05-25 ENCOUNTER — Inpatient Hospital Stay (HOSPITAL_BASED_OUTPATIENT_CLINIC_OR_DEPARTMENT_OTHER)
Admission: EM | Admit: 2020-05-25 | Discharge: 2020-05-28 | DRG: 682 | Disposition: A | Discharge status: Home / Self Care | Payer: Medicare Other | Attending: Internal Medicine | Admitting: Internal Medicine

## 2020-05-25 ENCOUNTER — Encounter (HOSPITAL_BASED_OUTPATIENT_CLINIC_OR_DEPARTMENT_OTHER): Payer: Self-pay | Admitting: *Deleted

## 2020-05-25 ENCOUNTER — Emergency Department (HOSPITAL_BASED_OUTPATIENT_CLINIC_OR_DEPARTMENT_OTHER): Payer: Medicare Other | Admitting: Radiology

## 2020-05-25 DIAGNOSIS — M069 Rheumatoid arthritis, unspecified: Secondary | ICD-10-CM | POA: Diagnosis not present

## 2020-05-25 DIAGNOSIS — F17210 Nicotine dependence, cigarettes, uncomplicated: Secondary | ICD-10-CM | POA: Diagnosis present

## 2020-05-25 DIAGNOSIS — E890 Postprocedural hypothyroidism: Secondary | ICD-10-CM | POA: Diagnosis not present

## 2020-05-25 DIAGNOSIS — Z833 Family history of diabetes mellitus: Secondary | ICD-10-CM | POA: Diagnosis not present

## 2020-05-25 DIAGNOSIS — E1122 Type 2 diabetes mellitus with diabetic chronic kidney disease: Secondary | ICD-10-CM | POA: Diagnosis present

## 2020-05-25 DIAGNOSIS — M48061 Spinal stenosis, lumbar region without neurogenic claudication: Secondary | ICD-10-CM | POA: Diagnosis not present

## 2020-05-25 DIAGNOSIS — Z79899 Other long term (current) drug therapy: Secondary | ICD-10-CM

## 2020-05-25 DIAGNOSIS — Z7952 Long term (current) use of systemic steroids: Secondary | ICD-10-CM | POA: Diagnosis not present

## 2020-05-25 DIAGNOSIS — N250 Renal osteodystrophy: Secondary | ICD-10-CM | POA: Diagnosis not present

## 2020-05-25 DIAGNOSIS — R0602 Shortness of breath: Secondary | ICD-10-CM | POA: Diagnosis not present

## 2020-05-25 DIAGNOSIS — F17200 Nicotine dependence, unspecified, uncomplicated: Secondary | ICD-10-CM | POA: Diagnosis present

## 2020-05-25 DIAGNOSIS — E875 Hyperkalemia: Secondary | ICD-10-CM | POA: Diagnosis not present

## 2020-05-25 DIAGNOSIS — Z905 Acquired absence of kidney: Secondary | ICD-10-CM

## 2020-05-25 DIAGNOSIS — I120 Hypertensive chronic kidney disease with stage 5 chronic kidney disease or end stage renal disease: Secondary | ICD-10-CM | POA: Diagnosis not present

## 2020-05-25 DIAGNOSIS — R609 Edema, unspecified: Secondary | ICD-10-CM | POA: Diagnosis not present

## 2020-05-25 DIAGNOSIS — I10 Essential (primary) hypertension: Secondary | ICD-10-CM | POA: Diagnosis present

## 2020-05-25 DIAGNOSIS — N185 Chronic kidney disease, stage 5: Secondary | ICD-10-CM | POA: Diagnosis present

## 2020-05-25 DIAGNOSIS — B1920 Unspecified viral hepatitis C without hepatic coma: Secondary | ICD-10-CM | POA: Diagnosis not present

## 2020-05-25 DIAGNOSIS — E8889 Other specified metabolic disorders: Secondary | ICD-10-CM | POA: Diagnosis not present

## 2020-05-25 DIAGNOSIS — Z8249 Family history of ischemic heart disease and other diseases of the circulatory system: Secondary | ICD-10-CM

## 2020-05-25 DIAGNOSIS — Z9049 Acquired absence of other specified parts of digestive tract: Secondary | ICD-10-CM | POA: Diagnosis not present

## 2020-05-25 DIAGNOSIS — Z23 Encounter for immunization: Secondary | ICD-10-CM | POA: Diagnosis not present

## 2020-05-25 DIAGNOSIS — U071 COVID-19: Secondary | ICD-10-CM | POA: Diagnosis not present

## 2020-05-25 DIAGNOSIS — D631 Anemia in chronic kidney disease: Secondary | ICD-10-CM

## 2020-05-25 DIAGNOSIS — Z888 Allergy status to other drugs, medicaments and biological substances status: Secondary | ICD-10-CM

## 2020-05-25 DIAGNOSIS — N186 End stage renal disease: Secondary | ICD-10-CM | POA: Diagnosis present

## 2020-05-25 DIAGNOSIS — Z885 Allergy status to narcotic agent status: Secondary | ICD-10-CM

## 2020-05-25 DIAGNOSIS — I350 Nonrheumatic aortic (valve) stenosis: Secondary | ICD-10-CM | POA: Diagnosis not present

## 2020-05-25 DIAGNOSIS — L8990 Pressure ulcer of unspecified site, unspecified stage: Secondary | ICD-10-CM | POA: Diagnosis present

## 2020-05-25 DIAGNOSIS — M4316 Spondylolisthesis, lumbar region: Secondary | ICD-10-CM | POA: Diagnosis not present

## 2020-05-25 DIAGNOSIS — Z992 Dependence on renal dialysis: Secondary | ICD-10-CM

## 2020-05-25 DIAGNOSIS — R059 Cough, unspecified: Secondary | ICD-10-CM | POA: Diagnosis not present

## 2020-05-25 DIAGNOSIS — Z91041 Radiographic dye allergy status: Secondary | ICD-10-CM

## 2020-05-25 DIAGNOSIS — Z9119 Patient's noncompliance with other medical treatment and regimen: Secondary | ICD-10-CM

## 2020-05-25 LAB — CBC WITH DIFFERENTIAL/PLATELET
Abs Immature Granulocytes: 0.03 10*3/uL (ref 0.00–0.07)
Basophils Absolute: 0.1 10*3/uL (ref 0.0–0.1)
Basophils Relative: 1 %
Eosinophils Absolute: 0.3 10*3/uL (ref 0.0–0.5)
Eosinophils Relative: 4 %
HCT: 26.8 % — ABNORMAL LOW (ref 36.0–46.0)
Hemoglobin: 8.5 g/dL — ABNORMAL LOW (ref 12.0–15.0)
Immature Granulocytes: 0 %
Lymphocytes Relative: 18 %
Lymphs Abs: 1.3 10*3/uL (ref 0.7–4.0)
MCH: 27.2 pg (ref 26.0–34.0)
MCHC: 31.7 g/dL (ref 30.0–36.0)
MCV: 85.6 fL (ref 80.0–100.0)
Monocytes Absolute: 0.9 10*3/uL (ref 0.1–1.0)
Monocytes Relative: 12 %
Neutro Abs: 4.6 10*3/uL (ref 1.7–7.7)
Neutrophils Relative %: 65 %
Platelets: 482 10*3/uL — ABNORMAL HIGH (ref 150–400)
RBC: 3.13 MIL/uL — ABNORMAL LOW (ref 3.87–5.11)
RDW: 18.2 % — ABNORMAL HIGH (ref 11.5–15.5)
WBC: 7.2 10*3/uL (ref 4.0–10.5)
nRBC: 0 % (ref 0.0–0.2)

## 2020-05-25 LAB — BASIC METABOLIC PANEL
Anion gap: 20 — ABNORMAL HIGH (ref 5–15)
BUN: 71 mg/dL — ABNORMAL HIGH (ref 8–23)
CO2: 23 mmol/L (ref 22–32)
Calcium: 8.4 mg/dL — ABNORMAL LOW (ref 8.9–10.3)
Chloride: 97 mmol/L — ABNORMAL LOW (ref 98–111)
Creatinine, Ser: 9.64 mg/dL — ABNORMAL HIGH (ref 0.44–1.00)
GFR, Estimated: 4 mL/min — ABNORMAL LOW (ref >60)
Glucose, Bld: 87 mg/dL (ref 70–99)
Potassium: 7 mmol/L (ref 3.5–5.1)
Sodium: 140 mmol/L (ref 135–145)

## 2020-05-25 LAB — BRAIN NATRIURETIC PEPTIDE: B Natriuretic Peptide: 290.4 pg/mL — ABNORMAL HIGH (ref 0.0–100.0)

## 2020-05-25 MED ORDER — SODIUM BICARBONATE 8.4 % IV SOLN
100.0000 meq | Freq: Once | INTRAVENOUS | Status: AC
  Administered 2020-05-25: 100 meq via INTRAVENOUS
  Filled 2020-05-25: qty 50

## 2020-05-25 MED ORDER — ACETAMINOPHEN 650 MG RE SUPP: 650.0000 mg | Freq: Four times a day (QID) | RECTAL | Status: DC | PRN

## 2020-05-25 MED ORDER — SODIUM CHLORIDE 0.9 % IV SOLN: 100.0000 mg | Freq: Every day | INTRAVENOUS | Status: DC

## 2020-05-25 MED ORDER — DEXTROSE 10 % IV SOLN: Freq: Once | INTRAVENOUS | Status: DC

## 2020-05-25 MED ORDER — ONDANSETRON HCL 4 MG PO TABS: 4.0000 mg | ORAL_TABLET | Freq: Four times a day (QID) | ORAL | Status: DC | PRN

## 2020-05-25 MED ORDER — ACETAMINOPHEN 325 MG PO TABS
650.0000 mg | ORAL_TABLET | Freq: Four times a day (QID) | ORAL | Status: DC | PRN
  Filled 2020-05-25: qty 2

## 2020-05-25 MED ORDER — SODIUM CHLORIDE 0.9 % IV SOLN
200.0000 mg | Freq: Once | INTRAVENOUS | Status: AC
  Administered 2020-05-26: 200 mg via INTRAVENOUS
  Filled 2020-05-25: qty 40

## 2020-05-25 MED ORDER — OXYCODONE-ACETAMINOPHEN 10-325 MG PO TABS: 0.5000 | ORAL_TABLET | Freq: Four times a day (QID) | ORAL | Status: DC | PRN

## 2020-05-25 MED ORDER — SODIUM CHLORIDE 0.9 % IV SOLN
250.0000 mL | INTRAVENOUS | Status: DC | PRN
  Administered 2020-05-26: 250 mL via INTRAVENOUS

## 2020-05-25 MED ORDER — GUAIFENESIN-DM 100-10 MG/5ML PO SYRP
5.0000 mL | ORAL_SOLUTION | Freq: Four times a day (QID) | ORAL | Status: DC | PRN
  Administered 2020-05-27 (×2): 5 mL via ORAL
  Filled 2020-05-25 (×2): qty 5

## 2020-05-25 MED ORDER — ALBUTEROL SULFATE (2.5 MG/3ML) 0.083% IN NEBU
10.0000 mg | INHALATION_SOLUTION | Freq: Once | RESPIRATORY_TRACT | Status: AC
  Administered 2020-05-25: 10 mg via RESPIRATORY_TRACT
  Filled 2020-05-25: qty 12

## 2020-05-25 MED ORDER — SODIUM ZIRCONIUM CYCLOSILICATE 10 G PO PACK
10.0000 g | PACK | Freq: Once | ORAL | Status: AC
  Administered 2020-05-25: 10 g via ORAL
  Filled 2020-05-25: qty 1

## 2020-05-25 MED ORDER — SODIUM BICARBONATE 8.4 % IV SOLN
Freq: Once | INTRAVENOUS | Status: DC
  Filled 2020-05-25: qty 100

## 2020-05-25 MED ORDER — OXYCODONE-ACETAMINOPHEN 5-325 MG PO TABS
1.0000 | ORAL_TABLET | Freq: Once | ORAL | Status: AC
Start: 2020-05-25 — End: 2020-05-25
  Administered 2020-05-25: 1 via ORAL
  Filled 2020-05-25: qty 1

## 2020-05-25 MED ORDER — SODIUM BICARBONATE 8.4 % IV SOLN: Freq: Once | INTRAVENOUS | Status: DC

## 2020-05-25 MED ORDER — ONDANSETRON HCL 4 MG/2ML IJ SOLN: 4.0000 mg | Freq: Four times a day (QID) | INTRAMUSCULAR | Status: DC | PRN

## 2020-05-25 MED ORDER — OXYCODONE-ACETAMINOPHEN 5-325 MG PO TABS
1.0000 | ORAL_TABLET | Freq: Four times a day (QID) | ORAL | Status: DC | PRN
  Administered 2020-05-26 – 2020-05-27 (×2): 1 via ORAL
  Filled 2020-05-25 (×2): qty 1

## 2020-05-25 MED ORDER — SODIUM BICARBONATE 8.4 % IV SOLN
INTRAVENOUS | Status: AC
  Filled 2020-05-25: qty 50

## 2020-05-25 MED ORDER — OXYCODONE-ACETAMINOPHEN 5-325 MG PO TABS
1.0000 | ORAL_TABLET | Freq: Four times a day (QID) | ORAL | Status: DC | PRN
  Administered 2020-05-26 (×2): 1 via ORAL
  Filled 2020-05-25 (×2): qty 1

## 2020-05-25 MED ORDER — SODIUM CHLORIDE 0.9 % IV SOLN
1.0000 g | Freq: Once | INTRAVENOUS | Status: AC
  Administered 2020-05-25: 1 g via INTRAVENOUS
  Filled 2020-05-25: qty 10

## 2020-05-25 MED ORDER — OXYCODONE HCL 5 MG PO TABS
5.0000 mg | ORAL_TABLET | Freq: Four times a day (QID) | ORAL | Status: DC | PRN
Start: 2020-05-25 — End: 2020-05-28
  Administered 2020-05-26 – 2020-05-27 (×3): 5 mg via ORAL
  Filled 2020-05-25 (×3): qty 1

## 2020-05-25 MED ORDER — CALCIUM GLUCONATE-NACL 1-0.675 GM/50ML-% IV SOLN
INTRAVENOUS | Status: AC
  Administered 2020-05-25: 1000 mg
  Filled 2020-05-25: qty 50

## 2020-05-25 MED ORDER — HEPARIN SODIUM (PORCINE) 5000 UNIT/ML IJ SOLN
5000.0000 [IU] | Freq: Three times a day (TID) | INTRAMUSCULAR | Status: DC
  Administered 2020-05-26 – 2020-05-28 (×8): 5000 [IU] via SUBCUTANEOUS
  Filled 2020-05-25 (×8): qty 1

## 2020-05-25 MED ORDER — CHLORHEXIDINE GLUCONATE CLOTH 2 % EX PADS
6.0000 | MEDICATED_PAD | Freq: Every day | CUTANEOUS | Status: DC
  Administered 2020-05-26 – 2020-05-28 (×3): 6 via TOPICAL
  Filled 2020-05-25: qty 6

## 2020-05-25 MED ORDER — SODIUM CHLORIDE 0.9 % IV SOLN
200.0000 mg | Freq: Once | INTRAVENOUS | Status: DC
  Filled 2020-05-25: qty 40

## 2020-05-25 NOTE — Consult Note (Signed)
[facility] Renal Consultation Note  Requesting MD: Malvin Johns, MD Indication for Consultation:  ESRD, hyperkalemia   Chief complaint: shortness of breath   HPI:  [name] is a 70 y.o. female  With a history of ESRD on HD TTS at [place], HTN, reported hx hep C who presented to [place] with shortness of breath.  She was found to be markedly hyperkalemic.  Last HD on Tuesday, 3/8 - missed today. I was called by the ER and we asked that she be transferred to [facility] for HD.  She was just found to be covid positive.  Examined with appropriate PPE.  Note on chart review she was recently seen by outpatient palliative care for a home visit.  They have been following for about a year it appears.  She has expressed wanting to continue with dialysis.  She has been noncompliant with outpatient HD - missing or shortening treatments.  Last HD on 3/8 with 2.7 kg UF.   PMHx:   Past Medical History:  Diagnosis Date  . Anemia    anemia of chronic disease likely 2/2 ESRD per last anemia panel (02/2011) with Fe 35, TIBC 167, ferritin 2041  // BL Hgb 8-10  . Anuria    Due to dialysis  . Aortic stenosis, mild    03/2011 echo  . Arthritis    Back  . Blood transfusion 1990's   r/t Kidney removal surgery  . Brain aneurysm    No records could be found  . Colitis, ischemic ([place]) 01/2011   S/P partial colectomy of right hemicolon and ileostomy placement  . Diabetes mellitus    Borderline  . Dialysis patient [facility])    [place] Tues,Thursday, saturday  . Dysrhythmia    SVT for brief period in Feb 2013  . End stage renal disease on dialysis [facility])    Secondary to hypertension // T/Th/Sat dialysis on [employer]  . Foot drop, bilateral    after back surgery  . Heart murmur    Born with heart mumur, does not require follow up per pt  . Hepatitis C   . Hypertension    Does not see a heart doctor, had pre transplant stress test at [facility]    . Lumbar spinal  stenosis     bilateral L4-5 and L5-S1 posterior lumbar interbody arthrodesis and bilateral L4-S1 posterior lateral arthrodesis for lumbar stenosis dynamic lumbar spondylolisthesis lumbar spondylosis lumbar radiculopathy with foot drop // s/p surgery 01/2011 - see surgery section for details.  . Seizures ([place])    r/t HTN in 1990's x 1  . Umbilical hernia age 68    Past Surgical History:  Procedure Laterality Date  . A/V FISTULAGRAM Right 02/05/2019   Procedure: A/V FISTULAGRAM;  Surgeon: Katha Cabal, MD;  Location: [place] CV LAB;  Service: Cardiovascular;  Laterality: Right;  . APPENDECTOMY  1960's  . AV FISTULA PLACEMENT  04/08/2011   Procedure: INSERTION OF ARTERIOVENOUS (AV) GORE-TEX GRAFT ARM;  Surgeon: Rosetta Posner, MD;  Location: [facility] OR;  Service: Vascular;  Laterality: Right;  Revision of Upper Arm Gore-Tex Graft  . BACK SURGERY  01/30/2011   L4-S1 lumbar laminectomy facetectomy and foraminotomies for decompression a bilateral L4-5 and L5-S1 posterior lumbar interbody arthrodesis and bilateral L4-S1 posterior lateral arthrodesis for lumbar stenosis dynamic lumbar spondylolisthesis lumbar spondylosis lumbar radiculopathy with foot drop  . COLONOSCOPY  02/19/2011   Procedure: COLONOSCOPY;  Surgeon: Missy Sabins, MD;  Location: [place];  Service: Endoscopy;  Laterality: N/A;  . COLONOSCOPY  03/07/2011   Procedure: COLONOSCOPY;  Surgeon: Cleotis Nipper, MD;  Location[facility] ENDOSCOPY;  Service: Endoscopy;  Laterality: N/A;  no prep needed--pt has long Hartmann's pouch and ileostomy  . COLOSTOMY  02/09/2011   Reason for surgery: ischemic colitis of right hemicolon requiring partial colectomy and ileostomy.  Surgeon: Harl Bowie, MD;  Location: [place];  Service: General;  Laterality: Right;  . ILEOSTOMY CLOSURE  05/29/2011   Procedure: ILEOSTOMY TAKEDOWN;  Surgeon: Harl Bowie, MD;  Location: [place];  Service: General;  Laterality: N/A;  . INSERTION OF DIALYSIS  CATHETER Right 04/15/2016   Procedure: INSERTION OF DIALYSIS CATHETER RIGHT INTERNAL JUGULAR;  Surgeon: Conrad [place] Bonito, MD;  Location: [place];  Service: Vascular;  Laterality: Right;  . LAPAROTOMY  02/09/2011   Procedure: EXPLORATORY LAPAROTOMY;  Surgeon: Harl Bowie, MD;  Location: [place];  Service: General;  Laterality: N/A;  . NEPHRECTOMY  2010   right side done at [facility] on transplant list  . PARTIAL COLECTOMY  02/09/2011   Reason for surgery: ischemic colitis of right hemicolon; Surgeon: Harl Bowie, MD;  Location: [place];  Service: General;  Laterality: Right;  . REVISION OF ARTERIOVENOUS GORETEX GRAFT Right 04/15/2016   Procedure: REVISION OF ARTERIOVENOUS GORETEX GRAFT USING 76mX20cm GORETEX GRAFT;  Surgeon: BConrad Labette MD;  [place]  Service: Vascular;  Laterality: Right;  . RIB RESECTION     d/t kidney removal  . TONSILLECTOMY     as a child  . total parathyroidectomy  01/17/2001   with autotransplantation into left forearm // due to secondary hyperparathyroidism from ESRD  . UMBILICAL HERNIA REPAIR  age 70- 58 . UPPER EXTREMITY ANGIOGRAPHY Right 07/21/2017   Procedure: UPPER EXTREMITY ANGIOGRAPHY;  Surgeon: CWaynetta Sandy MD;  [facility];  Service: Cardiovascular;  Laterality: Right;  .[facility] KitchenVASCULAR SURGERY     right arm dialysis graft    Family Hx:  Family History  Problem Relation Age of Onset  . Diabetes Father   . Stroke Mother   . COPD Brother   . Heart attack Brother   . Bronchitis Sister   . Anesthesia problems Neg Hx   . Hypotension Neg Hx   . Malignant hyperthermia Neg Hx   . Pseudochol deficiency Neg Hx    FHx Maternal uncles and cousin with CKD   Social History:  reports that she has been smoking cigarettes. She has a 24.00 pack-year smoking history. She has never used smokeless tobacco. She reports current alcohol use of about 3.0 - 4.0 standard drinks of alcohol per week. She reports that she does not use  drugs.  Allergies:  Allergies  Allergen Reactions  . Iodinated Diagnostic Agents Other (See Comments)    Seziure Other reaction(s): Other (See Comments) Seziure   . Hydrocodone Nausea And Vomiting  . Metoprolol Nausea And Vomiting  . Other Other (See Comments)  . Iohexol Other (See Comments)    Reaction is convulsions    Medications: Prior to Admission medications   Medication Sig Start Date End Date Taking? Authorizing Provider  albuterol (VENTOLIN HFA) 108 (90 Base) MCG/ACT inhaler Inhale 2 puffs into the lungs every 4 (four) hours as needed for wheezing or shortness of breath. 10/25/19   Hall-Potvin, [place] PA-C  amLODipine (NORVASC) 10 MG tablet Take by mouth. 11/18/17   [provider]  cetirizine (ZYRTEC ALLERGY) 10 MG tablet Take 1 tablet (10  mg total) by mouth daily. 10/25/19   Hall-Potvin, [place], PA-C  diphenhydrAMINE (BENADRYL) 25 MG tablet Take 50 mg by mouth daily as needed (cold symptoms).    [provider]  fluticasone (FLONASE) 50 MCG/ACT nasal spray Place 1 spray into both nostrils daily. 10/25/19   Hall-Potvin, [place], PA-C  Ibuprofen 200 MG CAPS Take 400 mg by mouth every 6 (six) hours as needed for headache or moderate pain.  01/07/17   [provider]  oxyCODONE-acetaminophen (PERCOCET) 10-325 MG tablet Take 0.5 tablets by mouth 2 (two) times daily as needed. 03/04/19   Aline August, MD  predniSONE (DELTASONE) 50 MG tablet Take 1 tablet (50 mg total) by mouth daily with breakfast. 10/25/19   Hall-Potvin, [place], PA-C    I have reviewed the patient's current and reported prior to admission medications.  Labs:  BMP Latest Ref Rng & Units 05/25/2020 03/04/2019 03/03/2019  Glucose 70 - 99 mg/dL 87 103(H) 94  BUN 8 - 23 mg/dL 71(H) 36(H) 21  Creatinine 0.44 - 1.00 mg/dL 9.64(H) 6.77(H) 4.75(H)  Sodium 135 - 145 mmol/L 140 137 137  Potassium 3.5 - 5.1 mmol/L 7.0(HH) 3.5 3.8  Chloride 98 - 111 mmol/L 97(L) 93(L) 93(L)  CO2 22 - 32  mmol/L '23 24 26  '$ Calcium 8.9 - 10.3 mg/dL 8.4(L) 7.7(L) 8.4(L)    ROS:  Pertinent items noted in HPI and remainder of comprehensive ROS otherwise negative.    Physical Exam: Vitals:   05/25/20 1800 05/25/20 1900  BP: (!) 142/72 (!) 164/78  Pulse: 89 88  Resp: 17 17  Temp:    SpO2: 100% 100%     General:  Adult female in bed in NAD at rest; lying near flat HEENT: NCAT Eyes: EOMI sclera anicteric Neck: supple trachea midline Heart: S1S2 no rub Lungs: crackles on auscultation; unlabored at rest on room air  Abdomen: soft/NT/ND Extremities: 1-2+ edema lower extremities; no clubbing. Nails discolored Skin: no rash on extremities exposed Neuro: awake and conversant; provides basic hx and follows commands Psych no anxiety or agitation Access RUE av graft bruit and thrill   Outpatient HD orders:  TTS at [place]  2K 2.5 ca bath  AV graft  BF 400 DF 500  EDW 42 kg 3.5 hours mircera 60 mcg every 2 weeks - last given 05/16/20 Not on activated vit D  Assessment/Plan:  Hyperkalemia - HD tonight - orders in and RN aware and has gotten report  - 20 minutes of 0K bath initially and then repeat K stat; HD RN will page me if repeat K over 5.5  - given temporizing bicarb and lokelma earlier  - encouraged compliance - appears longstanding issue and see that she follows with palliative care re: symptoms of chronic pain  - would transition to 180 dialyzer outpatient  - please limit to a renal diet  ESRD - HD tonight and per TTS  HTN  - optimize volume with HD   Covid 19 positive - therapies per primary team   Anemia CKD - recently got mircera 3/1 outpatient  Metabolic bone disease - monitor phos in AM; not on activated vit D outpatient   Claudia Desanctis 05/25/2020, 8:51 PM

## 2020-05-25 NOTE — ED Notes (Signed)
Pt's family wanted staff at oncoming hospital to know that the Pt is not ambulatory and has drop foot. Pt needs to carefully be moved due to should issues and non-ambulatory

## 2020-05-25 NOTE — ED Notes (Signed)
Called [facility] to advise patient is Covid positive.  Patient had to be placed in another room at [facility]

## 2020-05-25 NOTE — ED Notes (Signed)
Potassium 7.0 critical lab value reported to [place], [name]

## 2020-05-25 NOTE — ED Provider Notes (Signed)
[place] EMERGENCY DEPT Provider Note   CSN: [id number] Arrival date & time: 05/25/20  1510     History Chief Complaint  Patient presents with  . Cough  . fluid retention    [name] is a 70 y.o. female.  Patient is a 70 year old female who presents with cough and shortness of breath.  She has a history of end-stage renal disease on dialysis, borderline diabetes, hypertension, hepatitis C, aortic stenosis, heart murmur.  She does dialysis Tuesday Thursday Saturday.  Her daughter states that she has had a worsening cough over the last month.  She says is been worse over the last week.  She does have some associated shortness of breath at times with it.  She has had some increased leg swelling which is been going on for about the last month.  She does not really notice a change in the swelling after dialysis.  She has not had any known fevers.  She typically misses about 1 day a week of dialysis because she does not feel like going and is too tired.  She did not go to dialysis today.  Her last dialysis was 2 days ago.  She denies any chest pain or tightness.  She does not produce any urine.        Past Medical History:  Diagnosis Date  . Anemia    anemia of chronic disease likely 2/2 ESRD per last anemia panel (02/2011) with Fe 35, TIBC 167, ferritin 2041  // BL Hgb 8-10  . Anuria    Due to dialysis  . Aortic stenosis, mild    03/2011 echo  . Arthritis    Back  . Blood transfusion 1990's   r/t Kidney removal surgery  . Brain aneurysm    No records could be found  . Colitis, ischemic ([place]) 01/2011   S/P partial colectomy of right hemicolon and ileostomy placement  . Diabetes mellitus    Borderline  . Dialysis patient [facility])    [place] Tues,Thursday, saturday  . Dysrhythmia    SVT for brief period in Feb 2013  . End stage renal disease on dialysis [facility])    Secondary to hypertension // T/Th/Sat dialysis on [employer]  . Foot drop,  bilateral    after back surgery  . Heart murmur    Born with heart mumur, does not require follow up per pt  . Hepatitis C   . Hypertension    Does not see a heart doctor, had pre transplant stress test at [facility]    . Lumbar spinal stenosis     bilateral L4-5 and L5-S1 posterior lumbar interbody arthrodesis and bilateral L4-S1 posterior lateral arthrodesis for lumbar stenosis dynamic lumbar spondylolisthesis lumbar spondylosis lumbar radiculopathy with foot drop // s/p surgery 01/2011 - see surgery section for details.  . Seizures ([place])    r/t HTN in 1990's x 1  . Umbilical hernia age 59    Patient Active Problem List   Diagnosis Date Noted  . Hyperkalemia 05/25/2020  . Pain due to onychomycosis of toenails of both feet 08/11/2019  . Acute metabolic encephalopathy   . Pneumonia 02/25/2019  . Uremia 02/25/2019  . HCAP (healthcare-associated pneumonia) 02/23/2019  . Complication of vascular access for dialysis 01/17/2019  . Rheumatoid arthritis ([place]) 11/16/2018  . Complication of vascular dialysis catheter 04/15/2016  . At high risk for falls 05/19/2015  . Bilateral foot-drop 05/19/2015  . History of spontaneous subarachnoid intracranial hemorrhage due to cerebral aneurysm 05/19/2015  .  Tobacco dependence 05/19/2015  . Underimmunization status 05/06/2015  . Headache 12/08/2013  . Fever, unspecified 12/08/2013  . Pruritus, unspecified 12/08/2013  . Diarrhea, unspecified 12/02/2013  . Pain, unspecified 12/02/2013  . Coagulation defect, unspecified ([place]) 11/29/2013  . Muscle weakness (generalized) 05/15/2012  . Abnormality of gait 05/15/2012  . Other acquired deformity of ankle and foot(736.79) 05/15/2012  . Iron deficiency anemia, unspecified 02/17/2012  . Lumbar radiculitis 01/17/2012  . Post herpetic neuralgia 11/25/2011  . Leg pain, right 10/14/2011  . Valtrex overdose 10/01/2011  . Shingles rash 09/30/2011  . Body mass index (BMI) less than or equal to 19 in adult  07/10/2011  . Unspecified protein-calorie malnutrition ([place]) 06/12/2011  . Preop cardiovascular exam 05/28/2011  . Sinus tachycardia 04/23/2011  . Aortic stenosis 04/23/2011  . Ileostomy in place [facility]) 04/22/2011  . Other specified cardiac arrhythmias 04/04/2011  . Other acquired deformities of unspecified foot 03/22/2011  . Anemia of chronic kidney failure 02/09/2011  . Acute ischemic colitis ([place]) 02/09/2011  . Neuralgia and neuritis, unspecified 02/01/2011  . Lumbar stenosis with neurogenic claudication 01/30/2011  . Spondylolisthesis of lumbar region 01/30/2011  . End stage renal disease on dialysis ([place]) 01/30/2011  . Hypertension 01/30/2011  . Unspecified viral hepatitis C without hepatic coma 01/30/2011  . Hepatitis C antibody test positive 01/30/2011  . Alcohol abuse, uncomplicated 123XX123  . Awaiting organ transplant status 03/17/2009  . Benign neoplasm of unspecified kidney 03/17/2009  . Disorder of phosphorus metabolism, unspecified 03/17/2009  . Hypercalcemia 03/17/2009  . Other long term (current) drug therapy 03/17/2009  . Personal history of pneumonia (recurrent) 03/17/2009  . Patient's noncompliance with other medical treatment and regimen 03/17/2009  . Polycystic kidney, adult type 03/17/2009  . Acquired absence of kidney 06/21/2008  . Disorder of kidney and ureter, unspecified 06/14/2008  . Other abnormal glucose 05/06/2006  . Type 2 diabetes mellitus without complications ([place]) 123XX123  . Secondary hyperparathyroidism of renal origin ([place]) 12/17/2002  . Postprocedural hypoparathyroidism ([place]) 01/16/2001  . Contact with and (suspected) exposure to potentially hazardous body fluids 03/17/2000  . Other intervertebral disc degeneration, lumbosacral region 09/06/1999  . Allergy status to other drugs, medicaments and biological substances status 02/03/1998  . Hypertensive chronic kidney disease with stage 5 chronic kidney disease or end stage renal disease ([place])  02/03/1998  . Nonrheumatic mitral (valve) insufficiency 02/03/1998  . Dependence on renal dialysis ([place]) 03/18/1992    Past Surgical History:  Procedure Laterality Date  . A/V FISTULAGRAM Right 02/05/2019   Procedure: A/V FISTULAGRAM;  Surgeon: Katha Cabal, MD;  Location: [place] CV LAB;  Service: Cardiovascular;  Laterality: Right;  . APPENDECTOMY  1960's  . AV FISTULA PLACEMENT  04/08/2011   Procedure: INSERTION OF ARTERIOVENOUS (AV) GORE-TEX GRAFT ARM;  Surgeon: Rosetta Posner, MD;  Location: [facility] OR;  Service: Vascular;  Laterality: Right;  Revision of Upper Arm Gore-Tex Graft  . BACK SURGERY  01/30/2011   L4-S1 lumbar laminectomy facetectomy and foraminotomies for decompression a bilateral L4-5 and L5-S1 posterior lumbar interbody arthrodesis and bilateral L4-S1 posterior lateral arthrodesis for lumbar stenosis dynamic lumbar spondylolisthesis lumbar spondylosis lumbar radiculopathy with foot drop  . COLONOSCOPY  02/19/2011   Procedure: COLONOSCOPY;  Surgeon: Missy Sabins, MD;  Location: [place];  Service: Endoscopy;  Laterality: N/A;  . COLONOSCOPY  03/07/2011   Procedure: COLONOSCOPY;  Surgeon: Cleotis Nipper, MD;  Location: [facility] ENDOSCOPY;  Service: Endoscopy;  Laterality: N/A;  no prep needed--pt has long Hartmann's pouch and ileostomy  .  COLOSTOMY  02/09/2011   Reason for surgery: ischemic colitis of right hemicolon requiring partial colectomy and ileostomy.  Surgeon: Harl Bowie, MD;  Location: [place];  Service: General;  Laterality: Right;  . ILEOSTOMY CLOSURE  05/29/2011   Procedure: ILEOSTOMY TAKEDOWN;  Surgeon: Harl Bowie, MD;  Location: [place];  Service: General;  Laterality: N/A;  . INSERTION OF DIALYSIS CATHETER Right 04/15/2016   Procedure: INSERTION OF DIALYSIS CATHETER RIGHT INTERNAL JUGULAR;  Surgeon: Conrad Lillian, MD;  Location: [place];  Service: Vascular;  Laterality: Right;  . LAPAROTOMY  02/09/2011   Procedure: EXPLORATORY LAPAROTOMY;  Surgeon:  Harl Bowie, MD;  Location: [place];  Service: General;  Laterality: N/A;  . NEPHRECTOMY  2010   right side done at [facility] on transplant list  . PARTIAL COLECTOMY  02/09/2011   Reason for surgery: ischemic colitis of right hemicolon; Surgeon: Harl Bowie, MD;  Location: [place];  Service: General;  Laterality: Right;  . REVISION OF ARTERIOVENOUS GORETEX GRAFT Right 04/15/2016   Procedure: REVISION OF ARTERIOVENOUS GORETEX GRAFT USING 21mX20cm GORETEX GRAFT;  Surgeon: BConrad Glenford MD;  [place]  Service: Vascular;  Laterality: Right;  . RIB RESECTION     d/t kidney removal  . TONSILLECTOMY     as a child  . total parathyroidectomy  01/17/2001   with autotransplantation into left forearm // due to secondary hyperparathyroidism from ESRD  . UMBILICAL HERNIA REPAIR  age 70- 42 . UPPER EXTREMITY ANGIOGRAPHY Right 07/21/2017   Procedure: UPPER EXTREMITY ANGIOGRAPHY;  Surgeon: CWaynetta Sandy MD;  [facility];  Service: Cardiovascular;  Laterality: Right;  .Mar[facility] KitchenVASCULAR SURGERY     right arm dialysis graft     OB History   No obstetric history on file.     Family History  Problem Relation Age of Onset  . Diabetes Father   . Stroke Mother   . COPD Brother   . Heart attack Brother   . Bronchitis Sister   . Anesthesia problems Neg Hx   . Hypotension Neg Hx   . Malignant hyperthermia Neg Hx   . Pseudochol deficiency Neg Hx     Social History   Tobacco Use  . Smoking status: Current Every Day Smoker    Packs/day: 0.50    Years: 48.00    Pack years: 24.00    Types: Cigarettes  . Smokeless tobacco: Never Used  . Tobacco comment: 6 cigarettes per day  Substance Use Topics  . Alcohol use: Yes    Alcohol/week: 3.0 - 4.0 standard drinks    Types: 3 - 4 Shots of liquor per week    Comment: 3-4 shots of liquor on Fridays  . Drug use: No    Home Medications Prior to Admission medications   Medication Sig Start Date End Date Taking?  Authorizing Provider  albuterol (VENTOLIN HFA) 108 (90 Base) MCG/ACT inhaler Inhale 2 puffs into the lungs every 4 (four) hours as needed for wheezing or shortness of breath. 10/25/19   Hall-Potvin, [place] PA-C  amLODipine (NORVASC) 10 MG tablet Take by mouth. 11/18/17   [provider]  cetirizine (ZYRTEC ALLERGY) 10 MG tablet Take 1 tablet (10 mg total) by mouth daily. 10/25/19   Hall-Potvin, [place] PA-C  diphenhydrAMINE (BENADRYL) 25 MG tablet Take 50 mg by mouth daily as needed (cold symptoms).    [provider]  fluticasone (FLONASE) 50 MCG/ACT nasal spray Place 1 spray into both nostrils daily.  10/25/19   Hall-Potvin, [place], PA-C  Ibuprofen 200 MG CAPS Take 400 mg by mouth every 6 (six) hours as needed for headache or moderate pain.  01/07/17   [provider]  oxyCODONE-acetaminophen (PERCOCET) 10-325 MG tablet Take 0.5 tablets by mouth 2 (two) times daily as needed. 03/04/19   Aline August, MD  predniSONE (DELTASONE) 50 MG tablet Take 1 tablet (50 mg total) by mouth daily with breakfast. 10/25/19   Hall-Potvin, [place], PA-C    Allergies    Iodinated diagnostic agents, Hydrocodone, Metoprolol, Other, and Iohexol  Review of Systems   Review of Systems  Constitutional: Positive for fatigue. Negative for chills, diaphoresis and fever.  HENT: Negative for congestion, rhinorrhea and sneezing.   Eyes: Negative.   Respiratory: Positive for cough and shortness of breath. Negative for chest tightness.   Cardiovascular: Positive for leg swelling. Negative for chest pain.  Gastrointestinal: Negative for abdominal pain, blood in stool, diarrhea, nausea and vomiting.  Genitourinary: Negative for difficulty urinating, flank pain, frequency and hematuria.  Musculoskeletal: Negative for arthralgias and back pain.  Skin: Negative for rash.  Neurological: Negative for dizziness, speech difficulty, weakness, numbness and headaches.    Physical Exam Updated Vital  Signs BP (!) 177/82 (BP Location: Left Arm)   Pulse 77   Temp 98 F (36.7 C) (Oral)   Resp 12   Ht '5\' 5"'$  (1.651 m)   Wt 50.4 kg   SpO2 100%   BMI 18.49 kg/m   Physical Exam Constitutional:      Appearance: She is well-developed.  HENT:     Head: Normocephalic and atraumatic.  Eyes:     Pupils: Pupils are equal, round, and reactive to light.  Cardiovascular:     Rate and Rhythm: Normal rate and regular rhythm.     Heart sounds: Murmur heard.    Pulmonary:     Effort: Pulmonary effort is normal. No respiratory distress.     Breath sounds: Rhonchi and rales (Crackles in the bases bilaterally with rhonchi) present. No wheezing.  Chest:     Chest wall: No tenderness.  Abdominal:     General: Bowel sounds are normal.     Palpations: Abdomen is soft.     Tenderness: There is no abdominal tenderness. There is no guarding or rebound.  Musculoskeletal:        General: No swelling or tenderness. Normal range of motion.     Cervical back: Normal range of motion and neck supple.     Right lower leg: Edema present.     Left lower leg: Edema present.     Comments: 2-3+ pitting edema to lower extremities bilaterally  Lymphadenopathy:     Cervical: No cervical adenopathy.  Skin:    General: Skin is warm and dry.     Findings: No rash.  Neurological:     Mental Status: She is alert and oriented to person, place, and time.     ED Results / Procedures / Treatments   Labs (all labs ordered are listed, but only abnormal results are displayed) Labs Reviewed  BASIC METABOLIC PANEL - Abnormal; Notable for the following components:      Result Value   Potassium 7.0 (*)    Chloride 97 (*)    BUN 71 (*)    Creatinine, Ser 9.64 (*)    Calcium 8.4 (*)    GFR, Estimated 4 (*)    Anion gap 20 (*)    All other components within normal limits  CBC  WITH DIFFERENTIAL/PLATELET - Abnormal; Notable for the following components:   RBC 3.13 (*)    Hemoglobin 8.5 (*)    HCT 26.8 (*)    RDW  18.2 (*)    Platelets 482 (*)    All other components within normal limits  BRAIN NATRIURETIC PEPTIDE - Abnormal; Notable for the following components:   B Natriuretic Peptide 290.4 (*)    All other components within normal limits  RESP PANEL BY RT-PCR (FLU A&B, COVID) [id number]    EKG None  Radiology DG Chest 2 View  Result Date: 05/25/2020 CLINICAL DATA:  Cough and shortness of breath. EXAM: CHEST - 2 VIEW COMPARISON:  10/25/2019 FINDINGS: Chronic hyperinflation. Persistent but improved bronchial thickening from prior. No acute or confluent airspace disease. Stable heart size and mediastinal contours. Aortic atherosclerosis. Surgical clips project in the posterior mid right hemithorax. Right axillary vascular stent. No acute osseous abnormalities are seen. IMPRESSION: Chronic hyperinflation without acute abnormality. Electronically Signed   By: Keith Rake M.D.   On: 05/25/2020 16:13    Procedures Procedures   Medications Ordered in ED Medications  sodium zirconium cyclosilicate (LOKELMA) packet 10 g (has no administration in time range)  sodium bicarbonate injection 100 mEq (has no administration in time range)  Chlorhexidine Gluconate Cloth 2 % PADS 6 each (has no administration in time range)  calcium gluconate 1 g in sodium chloride 0.9 % 100 mL IVPB (has no administration in time range)  albuterol (PROVENTIL) (2.5 MG/3ML) 0.083% nebulizer solution 10 mg (10 mg Nebulization Given 05/25/20 1725)    ED Course  I have reviewed the triage vital signs and the nursing notes.  Pertinent labs & imaging results that were available during my care of the patient were reviewed by me and considered in my medical decision making (see chart for details).    MDM Rules/Calculators/A&P                          Patient is a 70 year old female who presents with cough and some shortness of breath.  The cough has been present for about a month but seems to be getting worse.  She is afebrile.   She is not hypoxic.  She has some rhonchorous and few rales on lung exam but she does not have any increased work of breathing.  Chest x-ray does not show any obvious pulmonary edema.  Her labs are concerning for an elevated potassium of 7.  She did miss her dialysis today.  She also has some anemia with a hemoglobin of 8.5.  It looks like her baseline hemoglobin is around 9-10 based on record review although we do not have any super recent labs on her.  I did contact Dr. Royce Macadamia with nephrology who will arrange for patient to have dialysis when she is at [facility].  I spoke to Dr. Verlon Au who is excepted the patient for transfer to [facility].  She was given medications to treat her hyperkalemia including Lokelma, albuterol, calcium and bicarb.  CRITICAL CARE Performed by: Malvin Johns Total critical care time: 60 minutes Critical care time was exclusive of separately billable procedures and treating other patients. Critical care was necessary to treat or prevent imminent or life-threatening deterioration. Critical care was time spent personally by me on the following activities: development of treatment plan with patient and/or surrogate as well as nursing, discussions with consultants, evaluation of patient's response to treatment, examination of patient, obtaining history from patient or  surrogate, ordering and performing treatments and interventions, ordering and review of laboratory studies, ordering and review of radiographic studies, pulse oximetry and re-evaluation of patient's condition.  Final Clinical Impression(s) / ED Diagnoses Final diagnoses:  Hyperkalemia  Cough    Rx / DC Orders ED Discharge Orders    None       Malvin Johns, MD 05/25/20 [id number]

## 2020-05-25 NOTE — H&P (Signed)
History and Physical    [name] [id number] DOB: 1950-10-01 DOA: 05/25/2020  PCP: Janie Morning, DO   Patient coming from: Home   Chief Complaint: Cough, SOB, missed HD   HPI: [name] is a 70 y.o. female with medical history significant for ESRD on hemodialysis, chronic anemia, hypertension, chronic pain, current smoker, and hepatitis C, now presenting to the emergency department with increased cough and shortness of breath.  Patient has been poorly adherent to her dialysis schedule with her family reporting that she usually misses 1 session a week and often leaves early.  She has been seen by palliative care recently, confirms that she wishes to be full code and wants to continue with dialysis.  She has had increase in her chronic cough and dyspnea noted over the past month or so and seem to be worse today after missing her dialysis yesterday.  Patient reports shortness of breath worsening insidiously over approximately a month.  She has had a nonproductive cough that is also been worse in recent weeks.  She reports recent onset of bilateral foot and ankle swelling.  Denies leg tenderness or hemoptysis.  Reports that she had 2 doses of COVID-19 vaccine about a year ago.  [facility] ED Course: Upon arrival to the ED, patient is found to be afebrile, saturating upper 90s to 100% on room air, normal respiratory rate, and stable blood pressure.  EKG features sinus rhythm with first-degree AV nodal block and peaked T waves.  Chest x-ray notable for chronic hyperinflation without acute abnormality.  Chemistry panel concerning for potassium 7.0 and also notable for normal bicarbonate and BUN 71.  CBC features a normocytic anemia with hemoglobin 8.5 and platelets 482,000.  BNP is 290, lower than prior.  COVID-19 PCR was positive.  Patient was treated with nebulized albuterol, IV calcium, IV bicarbonate, Lokelma, and Percocet in the ED.  Nephrology was consulted by the ED physician  and recommended medical admission to [facility].  Review of Systems:  All other systems reviewed and apart from HPI, are negative.  Past Medical History:  Diagnosis Date   Anemia    anemia of chronic disease likely 2/2 ESRD per last anemia panel (02/2011) with Fe 35, TIBC 167, ferritin 2041  // BL Hgb 8-10   Anuria    Due to dialysis   Aortic stenosis, mild    03/2011 echo   Arthritis    Back   Blood transfusion 1990's   r/t Kidney removal surgery   Brain aneurysm    No records could be found   Colitis, ischemic ([place]) 01/2011   S/P partial colectomy of right hemicolon and ileostomy placement   Diabetes mellitus    Borderline   Dialysis patient [facility])    [place] Tues,Thursday, saturday   Dysrhythmia    SVT for brief period in Feb 2013   End stage renal disease on dialysis [facility])    Secondary to hypertension // T/Th/Sat dialysis on [employer]   Foot drop, bilateral    after back surgery   Heart murmur    Born with heart mumur, does not require follow up per pt   Hepatitis C    Hypertension    Does not see a heart doctor, had pre transplant stress test at [facility]     Lumbar spinal stenosis     bilateral L4-5 and L5-S1 posterior lumbar interbody arthrodesis and bilateral L4-S1 posterior lateral arthrodesis for lumbar stenosis dynamic lumbar spondylolisthesis lumbar spondylosis lumbar radiculopathy  with foot drop // s/p surgery 01/2011 - see surgery section for details.   Seizures ([place])    r/t HTN in [id number] x 1   Umbilical hernia age 54    Past Surgical History:  Procedure Laterality Date   A/V FISTULAGRAM Right 02/05/2019   Procedure: A/V FISTULAGRAM;  Surgeon: Katha Cabal, MD;  Location: [place] [facility];  Service: Cardiovascular;  Laterality: Right;   APPENDECTOMY  1960's   AV FISTULA PLACEMENT  04/08/2011   Procedure: INSERTION OF ARTERIOVENOUS (AV) GORE-TEX GRAFT ARM;  Surgeon: Rosetta Posner, MD;  Location: [place];   Service: Vascular;  Laterality: Right;  Revision of Upper Arm Gore-Tex Graft   BACK SURGERY  01/30/2011   L4-S1 lumbar laminectomy facetectomy and foraminotomies for decompression a bilateral L4-5 and L5-S1 posterior lumbar interbody arthrodesis and bilateral L4-S1 posterior lateral arthrodesis for lumbar stenosis dynamic lumbar spondylolisthesis lumbar spondylosis lumbar radiculopathy with foot drop   COLONOSCOPY  02/19/2011   Procedure: COLONOSCOPY;  Surgeon: Missy Sabins, MD;  Location: [place];  Service: Endoscopy;  Laterality: N/A;   COLONOSCOPY  03/07/2011   Procedure: COLONOSCOPY;  Surgeon: Cleotis Nipper, MD;  Location: [facility] ENDOSCOPY;  Service: Endoscopy;  Laterality: N/A;  no prep needed--pt has long Hartmann's pouch and ileostomy   COLOSTOMY  02/09/2011   Reason for surgery: ischemic colitis of right hemicolon requiring partial colectomy and ileostomy.  Surgeon: Harl Bowie, MD;  Location: [place];  Service: General;  Laterality: Right;   ILEOSTOMY CLOSURE  05/29/2011   Procedure: ILEOSTOMY TAKEDOWN;  Surgeon: Harl Bowie, MD;  Location: [place];  Service: General;  Laterality: N/A;   INSERTION OF DIALYSIS CATHETER Right 04/15/2016   Procedure: INSERTION OF DIALYSIS CATHETER RIGHT INTERNAL JUGULAR;  Surgeon: Conrad Tyonek, MD;  Location: [place];  Service: Vascular;  Laterality: Right;   LAPAROTOMY  02/09/2011   Procedure: EXPLORATORY LAPAROTOMY;  Surgeon: Harl Bowie, MD;  Location: [place];  Service: General;  Laterality: N/A;   NEPHRECTOMY  2010   right side done at [facility] on transplant list   PARTIAL COLECTOMY  02/09/2011   Reason for surgery: ischemic colitis of right hemicolon; Surgeon: Harl Bowie, MD;  Location: [place];  Service: General;  Laterality: Right;   REVISION OF ARTERIOVENOUS GORETEX GRAFT Right 04/15/2016   Procedure: REVISION OF ARTERIOVENOUS GORETEX GRAFT USING 87mX20cm GORETEX GRAFT;  Surgeon: BConrad [place] MD;  [place]   Service: Vascular;  Laterality: Right;   RIB RESECTION     d/t kidney removal   TONSILLECTOMY     as a child   total parathyroidectomy  01/17/2001   with autotransplantation into left forearm // due to secondary hyperparathyroidism from ESRD   [facility] [facility] 70[facility] 63  UPPER EXTREMITY ANGIOGRAPHY Right 07/21/2017   Procedure: UPPER EXTREMITY ANGIOGRAPHY;  Surgeon: CWaynetta Sandy MD;  [place] [facility];  Service: Cardiovascular;  Laterality: Right;   VASCULAR SURGERY     right arm dialysis graft    Social History:   reports that she has been smoking cigarettes. She has a 24.00 pack-year smoking history. She has never used smokeless tobacco. She reports current alcohol use of about 3.0 - 4.0 standard drinks of alcohol per week. She reports that she does not use drugs.  Allergies  Allergen Reactions   Iodinated Diagnostic Agents Other (See Comments)    Seziure Other reaction(s): Other (See Comments) Seziure    Hydrocodone Nausea  And Vomiting   Metoprolol Nausea And Vomiting   Other Other (See Comments)   Iohexol Other (See Comments)    Reaction is convulsions    Family History  Problem Relation Age of Onset   Diabetes Father    Stroke Mother    COPD Brother    Heart attack Brother    Bronchitis Sister    Anesthesia problems Neg Hx    Hypotension Neg Hx    Malignant hyperthermia Neg Hx    Pseudochol deficiency Neg Hx      Prior to Admission medications   Medication Sig Start Date End Date Taking? Authorizing Provider  albuterol (VENTOLIN HFA) 108 (90 Base) MCG/ACT inhaler Inhale 2 puffs into the lungs every 4 (four) hours as needed for wheezing or shortness of breath. 10/25/19   Hall-Potvin, [place], PA-C  amLODipine (NORVASC) 10 MG tablet Take by mouth. 11/18/17   [provider]  cetirizine (ZYRTEC ALLERGY) 10 MG tablet Take 1 tablet (10 mg total) by mouth daily. 10/25/19   Hall-Potvin, [place], PA-C   diphenhydrAMINE (BENADRYL) 25 MG tablet Take 50 mg by mouth daily as needed (cold symptoms).    [provider]  fluticasone (FLONASE) 50 MCG/ACT nasal spray Place 1 spray into both nostrils daily. 10/25/19   Hall-Potvin, [place], PA-C  Ibuprofen 200 MG CAPS Take 400 mg by mouth every 6 (six) hours as needed for headache or moderate pain.  01/07/17   [provider]  oxyCODONE-acetaminophen (PERCOCET) 10-325 MG tablet Take 0.5 tablets by mouth 2 (two) times daily as needed. 03/04/19   Aline August, MD  predniSONE (DELTASONE) 50 MG tablet Take 1 tablet (50 mg total) by mouth daily with breakfast. 10/25/19   Hall-Potvin, [place], [place]    Physical Exam: Vitals:   05/25/20 2200 05/25/20 2230 05/25/20 2300 05/25/20 2330  BP: 125/75 130/77 133/82 125/72  Pulse: (!) 106 (!) 108 (!) 111 (!) 110  Resp: '20 11 12 16  '$ Temp:      TempSrc:      SpO2: 99% 100% 100% 98%  Weight:      Height:        Constitutional: NAD, calm  Eyes: PERTLA, lids and conjunctivae normal ENMT: Mucous membranes are moist. Posterior pharynx clear of any exudate or lesions.   Neck: normal, supple, no masses, no thyromegaly Respiratory: no wheezing, no crackles. No accessory muscle use.  Cardiovascular: S1 & S2 heard, regular rate and rhythm. Bilateral foot and ankle edema.   Abdomen: No distension, no tenderness, soft. Bowel sounds active.  Musculoskeletal: no clubbing / cyanosis. No joint deformity upper and lower extremities.   Skin: no significant rashes, lesions, ulcers. Warm, dry, well-perfused. Neurologic: CN 2-12 grossly intact. Sensation intact. Moving all extremities.  Psychiatric: Alert and oriented to person, place, and situation. Pleasant and cooperative.    Labs and Imaging on Admission: I have personally reviewed following labs and imaging studies  CBC: Recent Labs  Lab 05/25/20 1532  WBC 7.2  NEUTROABS 4.6  HGB 8.5*  HCT 26.8*  MCV 85.6  PLT 123XX123*   Basic Metabolic  Panel: Recent Labs  Lab 05/25/20 1532  NA 140  K 7.0*  CL 97*  CO2 23  GLUCOSE 87  BUN 71*  CREATININE 9.64*  CALCIUM 8.4*   GFR: Estimated Creatinine Clearance: 6.1 mL/min (A) (by C-G formula based on SCr of 9.64 mg/dL (H)). Liver Function Tests: No results for input(s): AST, ALT, ALKPHOS, BILITOT, PROT, ALBUMIN in the last 168 hours. No results for  input(s): LIPASE, AMYLASE in the last 168 hours. No results for input(s): AMMONIA in the last 168 hours. Coagulation Profile: No results for input(s): INR, PROTIME in the last 168 hours. Cardiac Enzymes: No results for input(s): CKTOTAL, CKMB, CKMBINDEX, TROPONINI in the last 168 hours. BNP (last 3 results) No results for input(s): PROBNP in the last 8760 hours. HbA1C: No results for input(s): HGBA1C in the last 72 hours. CBG: No results for input(s): GLUCAP in the last 168 hours. Lipid Profile: No results for input(s): CHOL, HDL, LDLCALC, TRIG, CHOLHDL, LDLDIRECT in the last 72 hours. Thyroid Function Tests: No results for input(s): TSH, T4TOTAL, FREET4, T3FREE, THYROIDAB in the last 72 hours. Anemia Panel: No results for input(s): VITAMINB12, FOLATE, FERRITIN, TIBC, IRON, RETICCTPCT in the last 72 hours. Urine analysis: No results found for: COLORURINE, APPEARANCEUR, LABSPEC, PHURINE, GLUCOSEU, HGBUR, BILIRUBINUR, KETONESUR, PROTEINUR, UROBILINOGEN, NITRITE, LEUKOCYTESUR Sepsis Labs: '@LABRCNTIP'$ (procalcitonin:4,lacticidven:4) ) Recent Results (from the past 240 hour(s))  Resp Panel by RT-PCR (Flu A&B, Covid) Nasopharyngeal Swab     Status: Abnormal   Collection Time: 05/25/20  6:40 PM   Specimen: Nasopharyngeal Swab; Nasopharyngeal(NP) swabs in vial transport medium  Result Value Ref Range Status   SARS Coronavirus 2 by RT PCR POSITIVE (A) NEGATIVE Final    Comment: CRITICAL RESULT CALLED TO, READ BACK BY AND VERIFIED WITH: ANDREW POWELL CHARGE RN AT 1940 ON 3/10 SG (NOTE) SARS-CoV-2 target nucleic acids are  DETECTED.  The SARS-CoV-2 RNA is generally detectable in upper respiratory specimens during the acute phase of infection. Positive results are indicative of the presence of the identified virus, but do not rule out bacterial infection or co-infection with other pathogens not detected by the test. Clinical correlation with patient history and other diagnostic information is necessary to determine patient infection status. The expected result is Negative.  Fact Sheet for Patients: EntrepreneurPulse.com.au  Fact Sheet for Healthcare Providers: IncredibleEmployment.be  This test is not yet approved or cleared by the [place] FDA and  has been authorized for detection and/or diagnosis of SARS-CoV-2 by FDA under an Emergency Use Authorization (EUA).  This EUA will remain in effect (meanin g this test can be used) for the duration of  the COVID-19 declaration under Section 564(b)(1) of the Act, 21 U.S.C. section 360bbb-3(b)(1), unless the authorization is terminated or revoked sooner.     Influenza A by PCR NEGATIVE NEGATIVE Final   Influenza B by PCR NEGATIVE NEGATIVE Final    Comment: (NOTE) The Xpert Xpress SARS-CoV-2/FLU/RSV plus assay is intended as an aid in the diagnosis of influenza from Nasopharyngeal swab specimens and should not be used as a sole basis for treatment. Nasal washings and aspirates are unacceptable for Xpert Xpress SARS-CoV-2/FLU/RSV testing.  Fact Sheet for Patients: EntrepreneurPulse.com.au  Fact Sheet for Healthcare Providers: IncredibleEmployment.be  This test is not yet approved or cleared by the [place] FDA and has been authorized for detection and/or diagnosis of SARS-CoV-2 by FDA under an Emergency Use Authorization (EUA). This EUA will remain in effect (meaning this test can be used) for the duration of the COVID-19 declaration under Section 564(b)(1) of the Act, 21  U.S.C. section 360bbb-3(b)(1), unless the authorization is terminated or revoked.       Radiological Exams on Admission: DG Chest 2 View  Result Date: 05/25/2020 CLINICAL DATA:  Cough and shortness of breath. EXAM: CHEST - 2 VIEW COMPARISON:  10/25/2019 FINDINGS: Chronic hyperinflation. Persistent but improved bronchial thickening from prior. No acute or confluent airspace disease. Stable heart size  and mediastinal contours. Aortic atherosclerosis. Surgical clips project in the posterior mid right hemithorax. Right axillary vascular stent. No acute osseous abnormalities are seen. IMPRESSION: Chronic hyperinflation without acute abnormality. Electronically Signed   By: Keith Rake M.D.   On: 05/25/2020 16:13    EKG: Independently reviewed. Sinus rhythm, 1st degree AV block, peaked T-waves.   Assessment/Plan   1. Hyperkalemia; ESRD  - Presents with increased SOB after missing HD and is found to have serum potassium of 7.0 with peaked T-waves  - Nephrology consulting and much appreciated and  - Patient was treated with temporizing measures in ED and has just completed HD  - Continue cardiac monitoring for now, repeat chem panel    2. COVID-19  - Patient reports increased SOB and non-productive cough over the course of a month and COVID pcr is positive  - There is no fever, no tachypnea, no acute CXR findings, and she has normal RR  - Unclear when she became infected or if her dyspnea is related to COVID  - Check/trend inflammatory markers, treat with 3 days remdesivir    3. Hypertension  - BP improved after HD, continue Norvasc    4. Anemia  - Hgb 8.5, down from 9.8 in December 2020  - No overt bleeding, monitor    DVT prophylaxis: sq heparin  Code Status: Full  Level of Care: Level of care: Telemetry Medical Family Communication: Daughter, Cindra Eves, was updated by phone   Disposition Plan:  Patient is from: Home  Anticipated d/c is to: TBD Anticipated d/c date  is: 05/27/20 Patient currently: Pending improvement in hyperkalemia and SOB with HD  Consults called: Nephrology  Admission status: Inpatient     Vianne Bulls, MD [facility]  05/25/2020, 11:38 PM

## 2020-05-25 NOTE — ED Triage Notes (Signed)
Patient is supposed to have a hemodialysis today but she refused to go.  Per patient's daughter, pt has been coughing for a month and its getting worst.  Bilateral LE is edematous.  Also c/o pain to pain to both hand and bottom.

## 2020-05-26 DIAGNOSIS — R059 Cough, unspecified: Secondary | ICD-10-CM | POA: Diagnosis not present

## 2020-05-26 DIAGNOSIS — I350 Nonrheumatic aortic (valve) stenosis: Secondary | ICD-10-CM | POA: Diagnosis not present

## 2020-05-26 DIAGNOSIS — E875 Hyperkalemia: Secondary | ICD-10-CM | POA: Diagnosis not present

## 2020-05-26 DIAGNOSIS — D631 Anemia in chronic kidney disease: Secondary | ICD-10-CM | POA: Diagnosis not present

## 2020-05-26 DIAGNOSIS — N186 End stage renal disease: Secondary | ICD-10-CM | POA: Diagnosis not present

## 2020-05-26 DIAGNOSIS — E1122 Type 2 diabetes mellitus with diabetic chronic kidney disease: Secondary | ICD-10-CM | POA: Diagnosis not present

## 2020-05-26 DIAGNOSIS — I120 Hypertensive chronic kidney disease with stage 5 chronic kidney disease or end stage renal disease: Secondary | ICD-10-CM | POA: Diagnosis not present

## 2020-05-26 DIAGNOSIS — U071 COVID-19: Secondary | ICD-10-CM | POA: Diagnosis not present

## 2020-05-26 DIAGNOSIS — N250 Renal osteodystrophy: Secondary | ICD-10-CM | POA: Diagnosis not present

## 2020-05-26 DIAGNOSIS — Z23 Encounter for immunization: Secondary | ICD-10-CM | POA: Diagnosis not present

## 2020-05-26 DIAGNOSIS — Z992 Dependence on renal dialysis: Secondary | ICD-10-CM | POA: Diagnosis not present

## 2020-05-26 DIAGNOSIS — R0602 Shortness of breath: Secondary | ICD-10-CM | POA: Diagnosis not present

## 2020-05-26 DIAGNOSIS — R609 Edema, unspecified: Secondary | ICD-10-CM | POA: Diagnosis not present

## 2020-05-26 DIAGNOSIS — B1920 Unspecified viral hepatitis C without hepatic coma: Secondary | ICD-10-CM | POA: Diagnosis not present

## 2020-05-26 LAB — FERRITIN
Ferritin: 1524 ng/mL — ABNORMAL HIGH (ref 11–307)
Ferritin: 1669 ng/mL — ABNORMAL HIGH (ref 11–307)

## 2020-05-26 LAB — HEPATIC FUNCTION PANEL
ALT: 8 U/L (ref 0–44)
AST: 15 U/L (ref 15–41)
Albumin: 3.6 g/dL (ref 3.5–5.0)
Alkaline Phosphatase: 52 U/L (ref 38–126)
Bilirubin, Direct: 0.1 mg/dL (ref 0.0–0.2)
Indirect Bilirubin: 0.6 mg/dL (ref 0.3–0.9)
Total Bilirubin: 0.7 mg/dL (ref 0.3–1.2)
Total Protein: 8.1 g/dL (ref 6.5–8.1)

## 2020-05-26 LAB — CBC
HCT: 26.9 % — ABNORMAL LOW (ref 36.0–46.0)
Hemoglobin: 8.9 g/dL — ABNORMAL LOW (ref 12.0–15.0)
MCH: 27.4 pg (ref 26.0–34.0)
MCHC: 33.1 g/dL (ref 30.0–36.0)
MCV: 82.8 fL (ref 80.0–100.0)
Platelets: 455 10*3/uL — ABNORMAL HIGH (ref 150–400)
RBC: 3.25 MIL/uL — ABNORMAL LOW (ref 3.87–5.11)
RDW: 17.7 % — ABNORMAL HIGH (ref 11.5–15.5)
WBC: 6.6 10*3/uL (ref 4.0–10.5)
nRBC: 0 % (ref 0.0–0.2)

## 2020-05-26 LAB — RENAL FUNCTION PANEL
Albumin: 3.6 g/dL (ref 3.5–5.0)
Anion gap: 15 (ref 5–15)
BUN: 17 mg/dL (ref 8–23)
CO2: 28 mmol/L (ref 22–32)
Calcium: 9.1 mg/dL (ref 8.9–10.3)
Chloride: 96 mmol/L — ABNORMAL LOW (ref 98–111)
Creatinine, Ser: 3.89 mg/dL — ABNORMAL HIGH (ref 0.44–1.00)
GFR, Estimated: 12 mL/min — ABNORMAL LOW (ref >60)
Glucose, Bld: 87 mg/dL (ref 70–99)
Phosphorus: 3.1 mg/dL (ref 2.5–4.6)
Potassium: 3.8 mmol/L (ref 3.5–5.1)
Sodium: 139 mmol/L (ref 135–145)

## 2020-05-26 LAB — POTASSIUM: Potassium: 3.3 mmol/L — ABNORMAL LOW (ref 3.5–5.1)

## 2020-05-26 LAB — C-REACTIVE PROTEIN
CRP: 7.6 mg/dL — ABNORMAL HIGH (ref <1.0)
CRP: 7 mg/dL — ABNORMAL HIGH (ref <1.0)

## 2020-05-26 LAB — MRSA PCR SCREENING: MRSA by PCR: NEGATIVE

## 2020-05-26 LAB — D-DIMER, QUANTITATIVE
D-Dimer, Quant: 2.93 ug/mL-FEU — ABNORMAL HIGH (ref 0.00–0.50)
D-Dimer, Quant: 3.01 ug/mL-FEU — ABNORMAL HIGH (ref 0.00–0.50)

## 2020-05-26 LAB — RESP PANEL BY RT-PCR (FLU A&B, COVID) ARPGX2
Influenza A by PCR: NEGATIVE
Influenza B by PCR: NEGATIVE
SARS Coronavirus 2 by RT PCR: POSITIVE — AB

## 2020-05-26 LAB — HIV ANTIBODY (ROUTINE TESTING W REFLEX): HIV Screen 4th Generation wRfx: NONREACTIVE

## 2020-05-26 LAB — PROCALCITONIN: Procalcitonin: 0.72 ng/mL

## 2020-05-26 LAB — MAGNESIUM: Magnesium: 2.2 mg/dL (ref 1.7–2.4)

## 2020-05-26 MED ORDER — SODIUM CHLORIDE 0.9 % IV SOLN
100.0000 mg | Freq: Every day | INTRAVENOUS | Status: AC
  Administered 2020-05-27 – 2020-05-28 (×2): 100 mg via INTRAVENOUS
  Filled 2020-05-26 (×2): qty 20

## 2020-05-26 MED ORDER — HYDROCORTISONE (PERIANAL) 2.5 % EX CREA
TOPICAL_CREAM | Freq: Three times a day (TID) | CUTANEOUS | Status: DC
  Administered 2020-05-27: 1 via RECTAL
  Filled 2020-05-26: qty 28.35

## 2020-05-26 MED ORDER — INFLUENZA VAC A&B SA ADJ QUAD 0.5 ML IM PRSY
0.5000 mL | PREFILLED_SYRINGE | INTRAMUSCULAR | Status: AC
  Administered 2020-05-28: 0.5 mL via INTRAMUSCULAR
  Filled 2020-05-26 (×2): qty 0.5

## 2020-05-26 MED ORDER — AMLODIPINE BESYLATE 10 MG PO TABS
10.0000 mg | ORAL_TABLET | Freq: Every day | ORAL | Status: DC
  Administered 2020-05-26 – 2020-05-28 (×3): 10 mg via ORAL
  Filled 2020-05-26 (×3): qty 1

## 2020-05-26 MED ORDER — NEPRO/CARBSTEADY PO LIQD
237.0000 mL | Freq: Two times a day (BID) | ORAL | Status: DC
  Administered 2020-05-26 – 2020-05-28 (×3): 237 mL via ORAL

## 2020-05-26 MED ORDER — NEPRO/CARBSTEADY PO LIQD: 237.0000 mL | Freq: Two times a day (BID) | ORAL | Status: DC

## 2020-05-26 NOTE — Progress Notes (Signed)
[facility] Progress Note  Subjective: seen in room, feeling much better, except for arthritis pains. Jerking has resolved. K 3.8 today.   Vitals:   05/26/20 0030 05/26/20 0128 05/26/20 0410 05/26/20 1143  BP: 125/77 (!) 143/69 (!) 157/72 130/67  Pulse: (!) 113 (!) 105 98 (!) 101  Resp: '12 20 18 18  '$ Temp: 98 F (36.7 C) 97.9 F (36.6 C) 97.7 F (36.5 C) 99 F (37.2 C)  TempSrc: Oral Axillary Oral Oral  SpO2: 98% 94% 96% 98%  Weight:      Height:        Exam:   alert, nad   no jvd  Chest cta bilat  Cor reg no RG  Abd soft ntnd no ascites   Ext no LE edema   Alert, NF, ox3   RUE AVG+bruit    OP HD: TTS [place]   3.5h  400/500  42kg  2/2.5 bath Hep none  R AVG  - mircera 60 q2 , last 05/16/20, due 3/15   Assessment/ Plan: 1. Hyperkalemia - resolved. K 3.8 today. Due to missed HD 2. Uremia - resolved w/ HD overnight.  3. ESRD - on HD TTS. Missed HD due to "arthritis pains", finds it difficult to get up in the morning. Has considered hospice/ stopping HD but not ready yet. Plan is for HD tomorrow.  4. COVID 19 + 5. Anemia ckd - Hb 8.6, getting esa at OP unit, next dose due 3/15 if still here.  6. MBD ckd - no vdra, phos pending.      Rob Lindsy Cerullo 05/26/2020, 3:51 PM   Recent Labs  Lab 05/25/20 1532 05/26/20 0135  K 7.0* 3.8  BUN 71* 17  CREATININE 9.64* 3.89*  CALCIUM 8.4* 9.1  PHOS  --  3.1  HGB 8.5* 8.9*   Inpatient medications: . amLODipine  10 mg Oral Daily  . Chlorhexidine Gluconate Cloth  6 each Topical Q0600  . feeding supplement (NEPRO CARB STEADY)  237 mL Oral BID BM  . heparin  5,000 Units Subcutaneous Q8H  . hydrocortisone   Rectal TID   . sodium chloride 250 mL (05/26/20 0207)  . [START ON 05/27/2020] remdesivir 100 mg in NS 100 mL     sodium chloride, acetaminophen **OR** acetaminophen, guaiFENesin-dextromethorphan, ondansetron **OR** ondansetron (ZOFRAN) IV, oxyCODONE-acetaminophen **AND** oxyCODONE,  oxyCODONE-acetaminophen

## 2020-05-26 NOTE — Progress Notes (Signed)
PROGRESS NOTE                                                                             PROGRESS NOTE                                                                                                                                                                                                             Patient Demographics:    [name], is a 70 y.o. female, DOB - November 22, 1950, [id number]  Outpatient Primary MD for the patient is Janie Morning, DO    LOS - 1  Admit date - 05/25/2020    Chief Complaint  Patient presents with  . Cough  . fluid retention       Brief Narrative     [name] is a 70 y.o. female with medical history significant for ESRD on hemodialysis, chronic anemia, hypertension, chronic pain, current smoker, and hepatitis C, now presenting to the emergency department with increased cough and shortness of breath.  Patient has been poorly adherent to her dialysis schedule with her family reporting that she usually misses 1 session a week and often leaves early.    Her work-up was significant for hyperkalemia of 7, as she missed hemodialysis the day she presented to [facility], as well she tested positive for COVID-19, she was admitted for further management.    Subjective:    [name] is any chest pain, fever or chills, denies any cough.     Assessment  & Plan :    Principal Problem:   Hyperkalemia Active Problems:   ESRD (end stage renal disease) ([place])   Hypertension   Tobacco dependence   Anemia due to stage 5 chronic kidney disease ([place])   COVID-19 virus detected   Pressure injury of skin   Cough   Hyperkalemia; ESRD  - Presents with increased SOB after missing HD and is found to have serum potassium of 7.0 with peaked T-waves  -We will consult greatly appreciated, patient was dialyzed overnight, with  hyperkalemia resolved, potassium 3.6 this morning. -Patient will continue  hemodialysis during hospital stay, given her COVID-19 positive status, her schedule has been changed as an outpatient to MWF, she will need to be dialyzed tomorrow during hospital stay, as it will be too long for her to receive next session on Monday.  COVID-19  - Patient reports increased SOB and non-productive cough over the course of a month and COVID pcr is positive  - There is no fever, no tachypnea, no acute CXR findings, and she has normal RR  - Finish 3 days of remdesivir   Hypertension  - BP improved after HD, continue Norvasc    Anemia of CKD - No overt bleeding, monitor    SpO2: 98 %  Recent Labs  Lab 05/25/20 0051 05/25/20 1532 05/25/20 1605 05/25/20 1840 05/26/20 0135  WBC  --  7.2  --   --  6.6  PLT  --  482*  --   --  455*  CRP 7.6*  --   --   --  7.0*  BNP  --   --  290.4*  --   --   DDIMER 3.01*  --   --   --  2.93*  PROCALCITON 0.72  --   --   --   --   AST  --   --   --   --  15  ALT  --   --   --   --  8  ALKPHOS  --   --   --   --  52  BILITOT  --   --   --   --  0.7  ALBUMIN  --   --   --   --  3.6  3.6  SARSCOV2NAA  --   --   --  POSITIVE*  --        ABG     Component Value Date/Time   PHART 7.469 (H) 02/26/2019 0133   PCO2ART 38.6 02/26/2019 0133   PO2ART 75.8 (L) 02/26/2019 0133   HCO3 27.7 02/26/2019 0133   TCO2 23 07/21/2017 1013   O2SAT 95.3 02/26/2019 0133        Condition - Extremely Guarded  Family Communication  :  None at bedside  Code Status :  full  Consults  :  renal  Disposition Plan  :    Status is: Inpatient  Remains inpatient appropriate because:IV treatments appropriate due to intensity of illness or inability to take PO   Dispo: The patient is from: Home              Anticipated d/c is to: Home              Patient currently is not medically stable to d/c.   Difficult to place patient No      DVT Prophylaxis  :  Heparin   Lab Results  Component Value Date   PLT 455 (H) 05/26/2020     Diet :  Diet Order            Diet renal with fluid restriction Fluid restriction: 1200 mL Fluid; Room service appropriate? Yes; Fluid consistency: Thin  Diet effective now                  Inpatient Medications  Scheduled Meds: . amLODipine  10 mg Oral Daily  . Chlorhexidine Gluconate Cloth  6 each Topical Q0600  . feeding supplement (NEPRO CARB STEADY)  237 mL Oral BID BM  .  heparin  5,000 Units Subcutaneous Q8H  . hydrocortisone   Rectal TID   Continuous Infusions: . sodium chloride 250 mL (05/26/20 0207)  . [START ON 05/27/2020] remdesivir 100 mg in NS 100 mL     PRN Meds:.sodium chloride, acetaminophen **OR** acetaminophen, guaiFENesin-dextromethorphan, ondansetron **OR** ondansetron (ZOFRAN) IV, oxyCODONE-acetaminophen **AND** oxyCODONE, oxyCODONE-acetaminophen  Antibiotics  :    Anti-infectives (From admission, onward)   Start     Dose/Rate Route Frequency Ordered Stop   05/27/20 1000  remdesivir 100 mg in sodium chloride 0.9 % 100 mL IVPB        100 mg 200 mL/hr over 30 Minutes Intravenous Daily 05/26/20 0508 05/29/20 0959   05/26/20 1000  remdesivir 100 mg in sodium chloride 0.9 % 100 mL IVPB  Status:  Discontinued       "Followed by" Linked Group Details   100 mg 200 mL/hr over 30 Minutes Intravenous Daily 05/25/20 2059 05/25/20 2140   05/26/20 1000  remdesivir 100 mg in sodium chloride 0.9 % 100 mL IVPB  Status:  Discontinued       "Followed by" Linked Group Details   100 mg 200 mL/hr over 30 Minutes Intravenous Daily 05/25/20 2140 05/26/20 0508   05/26/20 0000  remdesivir 200 mg in sodium chloride 0.9% 250 mL IVPB       "Followed by" Linked Group Details   200 mg 580 mL/hr over 30 Minutes Intravenous Once 05/25/20 2140 05/26/20 0253   05/25/20 2200  remdesivir 200 mg in sodium chloride 0.9% 250 mL IVPB  Status:  Discontinued       "Followed by" Linked Group Details   200 mg 580 mL/hr over 30 Minutes Intravenous Once 05/25/20 2059 05/25/20 2140         [name] M.D on 05/26/2020 at 2:56 PM  To page go to [email or web address]  [facility] -  Office  [phone]      Objective:   Vitals:   05/26/20 0030 05/26/20 0128 05/26/20 0410 05/26/20 1143  BP: 125/77 (!) 143/69 (!) 157/72 130/67  Pulse: (!) 113 (!) 105 98 (!) 101  Resp: '12 20 18 18  '$ Temp: 98 F (36.7 C) 97.9 F (36.6 C) 97.7 F (36.5 C) 99 F (37.2 C)  TempSrc: Oral Axillary Oral Oral  SpO2: 98% 94% 96% 98%  Weight:      Height:        Wt Readings from Last 3 Encounters:  05/25/20 90.5 kg  06/30/19 54.4 kg  03/02/19 41.8 kg     Intake/Output Summary (Last 24 hours) at 05/26/2020 1456 Last data filed at 05/26/2020 0836 Gross per 24 hour  Intake 633.92 ml  Output 2000 ml  Net -1366.08 ml     Physical Exam  Awake Alert, No new F.N deficits, Normal affect Symmetrical Chest wall movement, Good air movement bilaterally, CTAB RRR,No Gallops,Rubs or new Murmurs, No Parasternal Heave +ve B.Sounds, Abd Soft, No tenderness,No rebound - guarding or rigidity. No Cyanosis, Clubbing or edema, No new Rash or bruise      Data Review:    CBC Recent Labs  Lab 05/25/20 1532 05/26/20 0135  WBC 7.2 6.6  HGB 8.5* 8.9*  HCT 26.8* 26.9*  PLT 482* 455*  MCV 85.6 82.8  MCH 27.2 27.4  MCHC 31.7 33.1  RDW 18.2* 17.7*  LYMPHSABS 1.3  --   MONOABS 0.9  --   EOSABS 0.3  --   BASOSABS 0.1  --     Recent Labs  Lab 05/25/20 0051 05/25/20  1532 05/25/20 1605 05/26/20 0135  NA  --  140  --  139  K 3.3* 7.0*  --  3.8  CL  --  97*  --  96*  CO2  --  23  --  28  GLUCOSE  --  87  --  87  BUN  --  71*  --  17  CREATININE  --  9.64*  --  3.89*  CALCIUM  --  8.4*  --  9.1  AST  --   --   --  15  ALT  --   --   --  8  ALKPHOS  --   --   --  52  BILITOT  --   --   --  0.7  ALBUMIN  --   --   --  3.6  3.6  MG  --   --   --  2.2  CRP 7.6*  --   --  7.0*  DDIMER 3.01*  --   --  2.93*  PROCALCITON 0.72  --   --   --   BNP  --   --  290.4*  --      ------------------------------------------------------------------------------------------------------------------ No results for input(s): CHOL, HDL, LDLCALC, TRIG, CHOLHDL, LDLDIRECT in the last 72 hours.  Lab Results  Component Value Date   HGBA1C 4.3 04/04/2011   ------------------------------------------------------------------------------------------------------------------ No results for input(s): TSH, T4TOTAL, T3FREE, THYROIDAB in the last 72 hours.  Invalid input(s): FREET3  Cardiac Enzymes No results for input(s): CKMB, TROPONINI, MYOGLOBIN in the last 168 hours.  Invalid input(s): CK ------------------------------------------------------------------------------------------------------------------    Component Value Date/Time   BNP 290.4 (H) 05/25/2020 1605    Micro Results Recent Results (from the past 240 hour(s))  Resp Panel by RT-PCR (Flu A&B, Covid) Nasopharyngeal Swab     Status: Abnormal   Collection Time: 05/25/20  6:40 PM   Specimen: Nasopharyngeal Swab; Nasopharyngeal(NP) swabs in vial transport medium  Result Value Ref Range Status   SARS Coronavirus 2 by RT PCR POSITIVE (A) NEGATIVE Corrected    Comment: RESULT CALLED TO, READ BACK BY AND VERIFIED WITH: ANDREW POWELL CHARGE RN AT 1940 ON 3/10 SG (NOTE) SARS-CoV-2 target nucleic acids are DETECTED.  The SARS-CoV-2 RNA is generally detectable in upper respiratory specimens during the acute phase of infection. Positive results are indicative of the presence of the identified virus, but do not rule out bacterial infection or co-infection with other pathogens not detected by the test. Clinical correlation with patient history and other diagnostic information is necessary to determine patient infection status. The expected result is Negative.  Fact Sheet for Patients: EntrepreneurPulse.com.au  Fact Sheet for Healthcare Providers: IncredibleEmployment.be  This  test is not yet approved or cleared by the [place] FDA and  has been authorized for detection and/or diagnosis of SARS-CoV-2 by FDA under an Emergency Use Authorization (EUA).  This EUA will remain in effect (meaning this te st can be used) for the duration of  the COVID-19 declaration under Section 564(b)(1) of the Act, 21 U.S.C. section 360bbb-3(b)(1), unless the authorization is terminated or revoked sooner.  CORRECTED ON 03/11 AT [id number]: PREVIOUSLY REPORTED AS POSITIVE CRITICAL RESULT CALLED TO, READ BACK BY AND VERIFIED WITH: ANDREW POWELL CHARGE RN AT 1940 ON 3/10 SG    Influenza A by PCR NEGATIVE NEGATIVE Final   Influenza B by PCR NEGATIVE NEGATIVE Final    Comment: (NOTE) The Xpert Xpress SARS-CoV-2/FLU/RSV plus assay is intended as an aid in the diagnosis  of influenza from Nasopharyngeal swab specimens and should not be used as a sole basis for treatment. Nasal washings and aspirates are unacceptable for Xpert Xpress SARS-CoV-2/FLU/RSV testing.  Fact Sheet for Patients: EntrepreneurPulse.com.au  Fact Sheet for Healthcare Providers: IncredibleEmployment.be  This test is not yet approved or cleared by the [place] FDA and has been authorized for detection and/or diagnosis of SARS-CoV-2 by FDA under an Emergency Use Authorization (EUA). This EUA will remain in effect (meaning this test can be used) for the duration of the COVID-19 declaration under Section 564(b)(1) of the Act, 21 U.S.C. section 360bbb-3(b)(1), unless the authorization is terminated or revoked.    MRSA PCR Screening     Status: None   Collection Time: 05/26/20  4:44 AM   Specimen: Nasal Mucosa; Nasopharyngeal  Result Value Ref Range Status   MRSA by PCR NEGATIVE NEGATIVE Final    Comment:        The GeneXpert MRSA Assay (FDA approved for NASAL specimens only), is one component of a comprehensive MRSA colonization surveillance program. It is not intended  to diagnose MRSA infection nor to guide or monitor treatment for MRSA infections. Performed at [facility], [place] [address], [place], [place] [address]     Radiology Reports DG Chest 2 View  Result Date: 05/25/2020 CLINICAL DATA:  Cough and shortness of breath. EXAM: CHEST - 2 VIEW COMPARISON:  10/25/2019 FINDINGS: Chronic hyperinflation. Persistent but improved bronchial thickening from prior. No acute or confluent airspace disease. Stable heart size and mediastinal contours. Aortic atherosclerosis. Surgical clips project in the posterior mid right hemithorax. Right axillary vascular stent. No acute osseous abnormalities are seen. IMPRESSION: Chronic hyperinflation without acute abnormality. Electronically Signed   By: Keith Rake M.D.   On: 05/25/2020 16:13   MR SHOULDER LEFT WO CONTRAST  Result Date: 05/02/2020 CLINICAL DATA:  Left shoulder pain for the past 2-3 months. No injury or prior surgery. EXAM: MRI OF THE LEFT SHOULDER WITHOUT CONTRAST TECHNIQUE: Multiplanar, multisequence MR imaging of the shoulder was performed. No intravenous contrast was administered. COMPARISON:  Left shoulder x-rays dated March 02, 2018. FINDINGS: The sagittal T2 fat saturated sequence was not obtained as the patient terminated the exam early due to pain. Rotator cuff: Full-thickness, full width tears of the supraspinatus and infraspinatus tendons with over 5 cm of retraction to the base of the glenoid. Severe subscapularis tendinosis. The teres minor tendon is intact. Muscles: By moderate supraspinatus and infraspinatus muscle atrophy. Severe teres minor muscle atrophy. Biceps long head: Intact and normally positioned. Moderate intra-articular tendinosis. 5 mm intra-articular body within the bicipital groove (series 6, image 19). Acromioclavicular Joint: Moderate arthropathy of the acromioclavicular joint. Type II acromion. Moderate amount of fluid in the subacromial/subdeltoid bursa. Glenohumeral  Joint: Scattered high-grade partial and full-thickness cartilage loss over humeral head and glenoid. Small joint effusion. Labrum:  Diffusely degenerated and torn. Bones: No acute fracture or dislocation. High-riding humeral head. No suspicious bone lesion. Other: None. IMPRESSION: 1. Full-thickness, full width tears of the supraspinatus and infraspinatus tendons with over 5 cm of retraction and moderate muscle atrophy. 2. Severe subscapularis tendinosis. 3. Moderate intra-articular biceps tendinosis. 5 mm intra-articular body within the bicipital groove. 4. Moderate acromioclavicular and glenohumeral osteoarthritis. Electronically Signed   By: Titus Dubin M.D.   On: 05/02/2020 07:45

## 2020-05-26 NOTE — Progress Notes (Addendum)
Renal Navigator appreciates update from [facility] CSW/N. Rayyan of tentative discharge plan for Sunday, 3/13 to home. Patient was found to be COVID positive from swab on 05/25/20. Navigator spoke with patient's outpatient [facility] clinic[facility] who informs Navigator that patient will be dialyzed in COVID shift MWF at 4:00pm at [facility] after discharge. They will plan for her to start this schedule Monday, 05/29/20.  Navigator called patient's daughter, Cindra Eves, to discuss. She states understanding and confirms that she and her sister can transport their mother to COVID isolation shift (2 weeks from positive test date). Navigator asked that they call when they arrive to clinic at 4pm for treatments. She asked that this be documented for them and Navigator will note this in AVS. Ms. Pettiford told Navigator that she wanted to make sure everyone knew that her mother cannot use her arms/hands, and therefore needs to be fed. Navigator provided Ms. Pettiford with the 5W unit desk phone number and asked that she call the unit directly to relay this message. She agreed.  Navigator updated Attending and Nephrologist.   Alphonzo Cruise, [place] Renal Navigator [phone]

## 2020-05-26 NOTE — Progress Notes (Signed)
Initial Nutrition Assessment  DOCUMENTATION CODES:   Not applicable  INTERVENTION:  Continue Nepro Shake po BID, each supplement provides 425 kcal and 19 grams protein.  Encourage adequate PO intake.   NUTRITION DIAGNOSIS:   Increased nutrient needs related to chronic illness (ESRD HD) as evidenced by estimated needs.  GOAL:   Patient will meet greater than or equal to 90% of their needs  MONITOR:   PO intake,Supplement acceptance,Skin,Weight trends,Labs,I & O's  REASON FOR ASSESSMENT:   Malnutrition Screening Tool    ASSESSMENT:   70 y.o. female with medical history significant for ESRD on hemodialysis, chronic anemia, hypertension, chronic pain, current smoker, and hepatitis C presents with increased cough and shortness of breath. COVID-19 PCR was positive.  Pt unavailable during attempted time of contact. Unable to obtain pt nutrition history at this time. Pt currently has Nepro shake ordered. RD to continue with current orders to aid in caloric and protein needs. Unable to complete Nutrition-Focused physical exam at this time.   Labs and medications reviewed.   Diet Order:   Diet Order            Diet renal with fluid restriction Fluid restriction: 1200 mL Fluid; Room service appropriate? Yes; Fluid consistency: Thin  Diet effective now                 EDUCATION NEEDS:   Not appropriate for education at this time  Skin:  Skin Assessment: Skin Integrity Issues: Skin Integrity Issues:: Stage II Stage II: sacrum  Last BM:  Unknown  Height:   Ht Readings from Last 1 Encounters:  05/25/20 '5\' 5"'$  (1.651 m)    Weight:   Wt Readings from Last 1 Encounters:  05/25/20 90.5 kg   BMI:  Body mass index is 33.2 kg/m.  Estimated Nutritional Needs:   Kcal:  1850-2050  Protein:  90-100 grams  Fluid:  1.2 L/day  Corrin Parker, MS, RD, LDN RD pager number/after hours weekend pager number on Amion.

## 2020-05-27 DIAGNOSIS — R609 Edema, unspecified: Secondary | ICD-10-CM | POA: Diagnosis not present

## 2020-05-27 DIAGNOSIS — N186 End stage renal disease: Secondary | ICD-10-CM | POA: Diagnosis not present

## 2020-05-27 DIAGNOSIS — R0602 Shortness of breath: Secondary | ICD-10-CM | POA: Diagnosis not present

## 2020-05-27 DIAGNOSIS — E1122 Type 2 diabetes mellitus with diabetic chronic kidney disease: Secondary | ICD-10-CM | POA: Diagnosis not present

## 2020-05-27 DIAGNOSIS — E875 Hyperkalemia: Secondary | ICD-10-CM | POA: Diagnosis not present

## 2020-05-27 DIAGNOSIS — I120 Hypertensive chronic kidney disease with stage 5 chronic kidney disease or end stage renal disease: Secondary | ICD-10-CM | POA: Diagnosis not present

## 2020-05-27 DIAGNOSIS — I350 Nonrheumatic aortic (valve) stenosis: Secondary | ICD-10-CM | POA: Diagnosis not present

## 2020-05-27 DIAGNOSIS — B1920 Unspecified viral hepatitis C without hepatic coma: Secondary | ICD-10-CM | POA: Diagnosis not present

## 2020-05-27 DIAGNOSIS — N250 Renal osteodystrophy: Secondary | ICD-10-CM | POA: Diagnosis not present

## 2020-05-27 DIAGNOSIS — Z992 Dependence on renal dialysis: Secondary | ICD-10-CM | POA: Diagnosis not present

## 2020-05-27 DIAGNOSIS — R059 Cough, unspecified: Secondary | ICD-10-CM | POA: Diagnosis not present

## 2020-05-27 DIAGNOSIS — D631 Anemia in chronic kidney disease: Secondary | ICD-10-CM | POA: Diagnosis not present

## 2020-05-27 DIAGNOSIS — U071 COVID-19: Secondary | ICD-10-CM | POA: Diagnosis not present

## 2020-05-27 DIAGNOSIS — Z23 Encounter for immunization: Secondary | ICD-10-CM | POA: Diagnosis not present

## 2020-05-27 LAB — COMPREHENSIVE METABOLIC PANEL
ALT: 9 U/L (ref 0–44)
AST: 15 U/L (ref 15–41)
Albumin: 3.2 g/dL — ABNORMAL LOW (ref 3.5–5.0)
Alkaline Phosphatase: 49 U/L (ref 38–126)
Anion gap: 14 (ref 5–15)
BUN: 31 mg/dL — ABNORMAL HIGH (ref 8–23)
CO2: 29 mmol/L (ref 22–32)
Calcium: 8.7 mg/dL — ABNORMAL LOW (ref 8.9–10.3)
Chloride: 99 mmol/L (ref 98–111)
Creatinine, Ser: 6.14 mg/dL — ABNORMAL HIGH (ref 0.44–1.00)
GFR, Estimated: 7 mL/min — ABNORMAL LOW (ref >60)
Glucose, Bld: 81 mg/dL (ref 70–99)
Potassium: 4.2 mmol/L (ref 3.5–5.1)
Sodium: 142 mmol/L (ref 135–145)
Total Bilirubin: 0.9 mg/dL (ref 0.3–1.2)
Total Protein: 7.3 g/dL (ref 6.5–8.1)

## 2020-05-27 LAB — CBC
HCT: 27 % — ABNORMAL LOW (ref 36.0–46.0)
Hemoglobin: 8.6 g/dL — ABNORMAL LOW (ref 12.0–15.0)
MCH: 27 pg (ref 26.0–34.0)
MCHC: 31.9 g/dL (ref 30.0–36.0)
MCV: 84.9 fL (ref 80.0–100.0)
Platelets: 449 10*3/uL — ABNORMAL HIGH (ref 150–400)
RBC: 3.18 MIL/uL — ABNORMAL LOW (ref 3.87–5.11)
RDW: 17.9 % — ABNORMAL HIGH (ref 11.5–15.5)
WBC: 7.1 10*3/uL (ref 4.0–10.5)
nRBC: 0 % (ref 0.0–0.2)

## 2020-05-27 LAB — GLUCOSE, CAPILLARY
Glucose-Capillary: 98 mg/dL (ref 70–99)
Glucose-Capillary: 122 mg/dL — ABNORMAL HIGH (ref 70–99)

## 2020-05-27 MED ORDER — ALTEPLASE 2 MG IJ SOLR: 2.0000 mg | Freq: Once | INTRAMUSCULAR | Status: DC | PRN

## 2020-05-27 MED ORDER — PENTAFLUOROPROP-TETRAFLUOROETH EX AERO: 1.0000 "application " | INHALATION_SPRAY | CUTANEOUS | Status: DC | PRN

## 2020-05-27 MED ORDER — LIDOCAINE-PRILOCAINE 2.5-2.5 % EX CREA
1.0000 "application " | TOPICAL_CREAM | CUTANEOUS | Status: DC | PRN
  Filled 2020-05-27: qty 5

## 2020-05-27 MED ORDER — ALUM & MAG HYDROXIDE-SIMETH 200-200-20 MG/5ML PO SUSP
30.0000 mL | ORAL | Status: DC | PRN
  Administered 2020-05-27: 30 mL via ORAL
  Filled 2020-05-27: qty 30

## 2020-05-27 MED ORDER — LIDOCAINE HCL (PF) 1 % IJ SOLN
5.0000 mL | INTRAMUSCULAR | Status: DC | PRN
Start: 2020-05-27 — End: 2020-05-27
  Filled 2020-05-27: qty 5

## 2020-05-27 MED ORDER — SODIUM CHLORIDE 0.9 % IV SOLN: 100.0000 mL | INTRAVENOUS | Status: DC | PRN

## 2020-05-27 MED ORDER — SODIUM CHLORIDE 0.9 % IV SOLN
100.0000 mL | INTRAVENOUS | Status: DC | PRN
Start: 2020-05-27 — End: 2020-05-27

## 2020-05-27 MED ORDER — HEPARIN SODIUM (PORCINE) 1000 UNIT/ML DIALYSIS: 1000.0000 [IU] | INTRAMUSCULAR | Status: DC | PRN

## 2020-05-27 NOTE — Progress Notes (Signed)
[facility] Progress Note  Subjective: seen in room, many c/o's, nothing serious  Vitals:   05/27/20 1215 05/27/20 1230 05/27/20 1245 05/27/20 1300  BP: 134/75 135/66 131/66 133/71  Pulse: 79 92 80 89  Resp: 14 13 (!) 24 16  Temp:      TempSrc:      SpO2: 99% 100% 99% 98%  Weight:      Height:        Exam:   alert, nad   no jvd  Chest cta bilat  Cor reg no RG  Abd soft ntnd no ascites   Ext no LE edema   Alert, NF, ox3   RUE AVG+bruit    OP HD: TTS [place]   3.5h  400/500  42kg  2/2.5 bath Hep none  R AVG  - mircera 60 q2 , last 05/16/20, due 3/15   Assessment/ Plan: 1. Hyperkalemia - 7.0 on admit. SP HD has resolved. 2. Uremia - resolved w/ HD 3. ESRD - on HD TTS. Missed HD due to "arthritis pains", finds it difficult to get up in the morning. Has considered hospice/ stopping HD but not ready yet. Plan HD today on schedule.  4. COVID 19 + - per pmd 5. Anemia ckd - Hb 8.6, getting esa at OP unit, next dose due 3/15 if still here.  6. MBD ckd - no vdra, phos pending.  7. Dispo - stable from renal standpoint     Rob Kaysee Hergert 05/27/2020, 1:04 PM   Recent Labs  Lab 05/26/20 0135 05/27/20 0429  K 3.8 4.2  BUN 17 31*  CREATININE 3.89* 6.14*  CALCIUM 9.1 8.7*  PHOS 3.1  --   HGB 8.9* 8.6*   Inpatient medications: . amLODipine  10 mg Oral Daily  . Chlorhexidine Gluconate Cloth  6 each Topical Q0600  . feeding supplement (NEPRO CARB STEADY)  237 mL Oral BID BM  . heparin  5,000 Units Subcutaneous Q8H  . hydrocortisone   Rectal TID  . influenza vaccine adjuvanted  0.5 mL Intramuscular Tomorrow-1000   . sodium chloride    . sodium chloride    . sodium chloride 250 mL (05/26/20 0207)  . remdesivir 100 mg in NS 100 mL 100 mg (05/27/20 1033)   sodium chloride, sodium chloride, sodium chloride, acetaminophen **OR** acetaminophen, alteplase, guaiFENesin-dextromethorphan, heparin, lidocaine (PF), lidocaine-prilocaine, ondansetron **OR** ondansetron  (ZOFRAN) IV, oxyCODONE-acetaminophen **AND** oxyCODONE, oxyCODONE-acetaminophen, pentafluoroprop-tetrafluoroeth

## 2020-05-27 NOTE — Progress Notes (Signed)
PROGRESS NOTE                                                                             PROGRESS NOTE                                                                                                                                                                                                             Patient Demographics:    [name], is a 70 y.o. female, DOB - 1950/04/01, ZW:[id number]  Outpatient Primary MD for the patient is Janie Morning, DO    LOS - 2  Admit date - 05/25/2020    Chief Complaint  Patient presents with  . Cough  . fluid retention       Brief Narrative     [name] is a 70 y.o. female with medical history significant for ESRD on hemodialysis, chronic anemia, hypertension, chronic pain, current smoker, and hepatitis C, now presenting to the emergency department with increased cough and shortness of breath.  Patient has been poorly adherent to her dialysis schedule with her family reporting that she usually misses 1 session a week and often leaves early.    Her work-up was significant for hyperkalemia of 7, as she missed hemodialysis the day she presented to [place], as well she tested positive for COVID-19, she was admitted for further management.    Subjective:    [name] today denies any chest pain, shortness of breath, no fever or chills, complains of hemorrhoidal itching.    Assessment  & Plan :    Principal Problem:   Hyperkalemia Active Problems:   ESRD (end stage renal disease) ([place])   Hypertension   Tobacco dependence   Anemia due to stage 5 chronic kidney disease ([place])   COVID-19 virus detected   Pressure injury of skin   Cough   Hyperkalemia; ESRD  - Presents with increased SOB after missing HD and is found to have serum potassium of 7.0 with peaked T-waves  -We will consult greatly appreciated, patient  was dialyzed overnight, with hyperkalemia resolved, potassium 3.6  this morning. -Patient will continue hemodialysis during hospital stay, given her COVID-19 positive status, her schedule has been changed as an outpatient to MWF, so she will be dialyzed today as well, with plan to discharge tomorrow after finishing remdesivir which she can resume her new schedule while on COVID-19 isolation on Monday.    COVID-19  - Patient reports increased SOB and non-productive cough over the course of a month and COVID pcr is positive  - There is no fever, no tachypnea, no acute CXR findings, and she has normal RR  - Finish 3 days of remdesivir, days day 2 out of 3.   Hypertension  - BP improved after HD, continue Norvasc    Anemia of CKD - No overt bleeding, monitor    SpO2: 99 %  Recent Labs  Lab 05/25/20 0051 05/25/20 1532 05/25/20 1605 05/25/20 1840 05/26/20 0135 05/27/20 0429  WBC  --  7.2  --   --  6.6 7.1  PLT  --  482*  --   --  455* 449*  CRP 7.6*  --   --   --  7.0*  --   BNP  --   --  290.4*  --   --   --   DDIMER 3.01*  --   --   --  2.93*  --   PROCALCITON 0.72  --   --   --   --   --   AST  --   --   --   --  15 15  ALT  --   --   --   --  8 9  ALKPHOS  --   --   --   --  52 49  BILITOT  --   --   --   --  0.7 0.9  ALBUMIN  --   --   --   --  3.6  3.6 3.2*  SARSCOV2NAA  --   --   --  POSITIVE*  --   --        ABG     Component Value Date/Time   PHART 7.469 (H) 02/26/2019 0133   PCO2ART 38.6 02/26/2019 0133   PO2ART 75.8 (L) 02/26/2019 0133   HCO3 27.7 02/26/2019 0133   TCO2 23 07/21/2017 1013   O2SAT 95.3 02/26/2019 0133        Condition - Extremely Guarded  Family Communication  :  None at bedside  Code Status :  full  Consults  :  renal  Disposition Plan  :    Status is: Inpatient  Remains inpatient appropriate because:IV treatments appropriate due to intensity of illness or inability to take PO   Dispo: The patient is from: Home              Anticipated d/c is to: Home              Patient currently  is not medically stable to d/c.   Difficult to place patient No      DVT Prophylaxis  :  Heparin   Lab Results  Component Value Date   PLT 449 (H) 05/27/2020    Diet :  Diet Order            Diet renal with fluid restriction Fluid restriction: 1200 mL Fluid; Room service appropriate? Yes; Fluid consistency: Thin  Diet effective now  Inpatient Medications  Scheduled Meds: . amLODipine  10 mg Oral Daily  . Chlorhexidine Gluconate Cloth  6 each Topical Q0600  . feeding supplement (NEPRO CARB STEADY)  237 mL Oral BID BM  . heparin  5,000 Units Subcutaneous Q8H  . hydrocortisone   Rectal TID  . influenza vaccine adjuvanted  0.5 mL Intramuscular Tomorrow-1000   Continuous Infusions: . sodium chloride    . sodium chloride    . sodium chloride 250 mL (05/26/20 0207)  . remdesivir 100 mg in NS 100 mL 100 mg (05/27/20 1033)   PRN Meds:.sodium chloride, sodium chloride, sodium chloride, acetaminophen **OR** acetaminophen, alteplase, guaiFENesin-dextromethorphan, heparin, lidocaine (PF), lidocaine-prilocaine, ondansetron **OR** ondansetron (ZOFRAN) IV, oxyCODONE-acetaminophen **AND** oxyCODONE, oxyCODONE-acetaminophen, pentafluoroprop-tetrafluoroeth  Antibiotics  :    Anti-infectives (From admission, onward)   Start     Dose/Rate Route Frequency Ordered Stop   05/27/20 1000  remdesivir 100 mg in sodium chloride 0.9 % 100 mL IVPB        100 mg 200 mL/hr over 30 Minutes Intravenous Daily 05/26/20 0508 05/29/20 0959   05/26/20 1000  remdesivir 100 mg in sodium chloride 0.9 % 100 mL IVPB  Status:  Discontinued       "Followed by" Linked Group Details   100 mg 200 mL/hr over 30 Minutes Intravenous Daily 05/25/20 2059 05/25/20 2140   05/26/20 1000  remdesivir 100 mg in sodium chloride 0.9 % 100 mL IVPB  Status:  Discontinued       "Followed by" Linked Group Details   100 mg 200 mL/hr over 30 Minutes Intravenous Daily 05/25/20 2140 05/26/20 0508   05/26/20 0000   remdesivir 200 mg in sodium chloride 0.9% 250 mL IVPB       "Followed by" Linked Group Details   200 mg 580 mL/hr over 30 Minutes Intravenous Once 05/25/20 2140 05/26/20 0253   05/25/20 2200  remdesivir 200 mg in sodium chloride 0.9% 250 mL IVPB  Status:  Discontinued       "Followed by" Linked Group Details   200 mg 580 mL/hr over 30 Minutes Intravenous Once 05/25/20 2059 05/25/20 2140        Azavion Bouillon M.D on 05/27/2020 at 12:40 PM  To page go to [email or web address]  [facility] -  Office  [phone]      Objective:   Vitals:   05/27/20 1130 05/27/20 1145 05/27/20 1200 05/27/20 1215  BP: (!) 156/73 124/84 (!) 144/80 134/75  Pulse: 82 87 75 79  Resp: '17 13 11 14  '$ Temp: 98.8 F (37.1 C)     TempSrc: Oral     SpO2: 96% 100% 100% 99%  Weight:      Height:        Wt Readings from Last 3 Encounters:  05/27/20 90.3 kg  06/30/19 54.4 kg  03/02/19 41.8 kg     Intake/Output Summary (Last 24 hours) at 05/27/2020 1240 Last data filed at 05/26/2020 2349 Gross per 24 hour  Intake 120 ml  Output --  Net 120 ml     Physical Exam  Awake Alert, Oriented X 3, No new F.N deficits, Normal affect Symmetrical Chest wall movement, Good air movement bilaterally, CTAB RRR,No Gallops,Rubs or new Murmurs, No Parasternal Heave +ve B.Sounds, Abd Soft, No tenderness, No rebound - guarding or rigidity. No Cyanosis, Clubbing or edema, No new Rash or bruise      Data Review:    CBC Recent Labs  Lab 05/25/20 1532 05/26/20 0135 05/27/20 0429  WBC 7.2 6.6 7.1  HGB 8.5* 8.9* 8.6*  HCT 26.8* 26.9* 27.0*  PLT 482* 455* 449*  MCV 85.6 82.8 84.9  MCH 27.2 27.4 27.0  MCHC 31.7 33.1 31.9  RDW 18.2* 17.7* 17.9*  LYMPHSABS 1.3  --   --   MONOABS 0.9  --   --   EOSABS 0.3  --   --   BASOSABS 0.1  --   --     Recent Labs  Lab 05/25/20 0051 05/25/20 1532 05/25/20 1605 05/26/20 0135 05/27/20 0429  NA  --  140  --  139 142  K 3.3* 7.0*  --  3.8 4.2  CL  --  97*  --   96* 99  CO2  --  23  --  28 29  GLUCOSE  --  87  --  87 81  BUN  --  71*  --  17 31*  CREATININE  --  9.64*  --  3.89* 6.14*  CALCIUM  --  8.4*  --  9.1 8.7*  AST  --   --   --  15 15  ALT  --   --   --  8 9  ALKPHOS  --   --   --  52 49  BILITOT  --   --   --  0.7 0.9  ALBUMIN  --   --   --  3.6  3.6 3.2*  MG  --   --   --  2.2  --   CRP 7.6*  --   --  7.0*  --   DDIMER 3.01*  --   --  2.93*  --   PROCALCITON 0.72  --   --   --   --   BNP  --   --  290.4*  --   --     ------------------------------------------------------------------------------------------------------------------ No results for input(s): CHOL, HDL, LDLCALC, TRIG, CHOLHDL, LDLDIRECT in the last 72 hours.  Lab Results  Component Value Date   HGBA1C 4.3 04/04/2011   ------------------------------------------------------------------------------------------------------------------ No results for input(s): TSH, T4TOTAL, T3FREE, THYROIDAB in the last 72 hours.  Invalid input(s): FREET3  Cardiac Enzymes No results for input(s): CKMB, TROPONINI, MYOGLOBIN in the last 168 hours.  Invalid input(s): CK ------------------------------------------------------------------------------------------------------------------    Component Value Date/Time   BNP 290.4 (H) 05/25/2020 1605    Micro Results Recent Results (from the past 240 hour(s))  Resp Panel by RT-PCR (Flu A&B, Covid) Nasopharyngeal Swab     Status: Abnormal   Collection Time: 05/25/20  6:40 PM   Specimen: Nasopharyngeal Swab; Nasopharyngeal(NP) swabs in vial transport medium  Result Value Ref Range Status   SARS Coronavirus 2 by RT PCR POSITIVE (A) NEGATIVE Corrected    Comment: RESULT CALLED TO, READ BACK BY AND VERIFIED WITH: ANDREW POWELL CHARGE RN AT 1940 ON 3/10 SG (NOTE) SARS-CoV-2 target nucleic acids are DETECTED.  The SARS-CoV-2 RNA is generally detectable in upper respiratory specimens during the acute phase of infection. Positive results  are indicative of the presence of the identified virus, but do not rule out bacterial infection or co-infection with other pathogens not detected by the test. Clinical correlation with patient history and other diagnostic information is necessary to determine patient infection status. The expected result is Negative.  Fact Sheet for Patients: EntrepreneurPulse.com.au  Fact Sheet for Healthcare Providers: IncredibleEmployment.be  This test is not yet approved or cleared by the [place] FDA and  has been authorized for detection and/or diagnosis of SARS-CoV-2 by FDA under an Emergency  Use Authorization (EUA).  This EUA will remain in effect (meaning this te st can be used) for the duration of  the COVID-19 declaration under Section 564(b)(1) of the Act, 21 U.S.C. section 360bbb-3(b)(1), unless the authorization is terminated or revoked sooner.  CORRECTED ON 03/11 AT [id number]: PREVIOUSLY REPORTED AS POSITIVE CRITICAL RESULT CALLED TO, READ BACK BY AND VERIFIED WITH: ANDREW POWELL CHARGE RN AT 1940 ON 3/10 SG    Influenza A by PCR NEGATIVE NEGATIVE Final   Influenza B by PCR NEGATIVE NEGATIVE Final    Comment: (NOTE) The Xpert Xpress SARS-CoV-2/FLU/RSV plus assay is intended as an aid in the diagnosis of influenza from Nasopharyngeal swab specimens and should not be used as a sole basis for treatment. Nasal washings and aspirates are unacceptable for Xpert Xpress SARS-CoV-2/FLU/RSV testing.  Fact Sheet for Patients: EntrepreneurPulse.com.au  Fact Sheet for Healthcare Providers: IncredibleEmployment.be  This test is not yet approved or cleared by the [place] FDA and has been authorized for detection and/or diagnosis of SARS-CoV-2 by FDA under an Emergency Use Authorization (EUA). This EUA will remain in effect (meaning this test can be used) for the duration of the COVID-19 declaration under Section  564(b)(1) of the Act, 21 U.S.C. section 360bbb-3(b)(1), unless the authorization is terminated or revoked.    MRSA PCR Screening     Status: None   Collection Time: 05/26/20  4:44 AM   Specimen: Nasal Mucosa; Nasopharyngeal  Result Value Ref Range Status   MRSA by PCR NEGATIVE NEGATIVE Final    Comment:        The GeneXpert MRSA Assay (FDA approved for NASAL specimens only), is one component of a comprehensive MRSA colonization surveillance program. It is not intended to diagnose MRSA infection nor to guide or monitor treatment for MRSA infections. Performed at [facility], [place] [address], [place], [place] [address]     Radiology Reports DG Chest 2 View  Result Date: 05/25/2020 CLINICAL DATA:  Cough and shortness of breath. EXAM: CHEST - 2 VIEW COMPARISON:  10/25/2019 FINDINGS: Chronic hyperinflation. Persistent but improved bronchial thickening from prior. No acute or confluent airspace disease. Stable heart size and mediastinal contours. Aortic atherosclerosis. Surgical clips project in the posterior mid right hemithorax. Right axillary vascular stent. No acute osseous abnormalities are seen. IMPRESSION: Chronic hyperinflation without acute abnormality. Electronically Signed   By: Keith Rake M.D.   On: 05/25/2020 16:13   MR SHOULDER LEFT WO CONTRAST  Result Date: 05/02/2020 CLINICAL DATA:  Left shoulder pain for the past 2-3 months. No injury or prior surgery. EXAM: MRI OF THE LEFT SHOULDER WITHOUT CONTRAST TECHNIQUE: Multiplanar, multisequence MR imaging of the shoulder was performed. No intravenous contrast was administered. COMPARISON:  Left shoulder x-rays dated March 02, 2018. FINDINGS: The sagittal T2 fat saturated sequence was not obtained as the patient terminated the exam early due to pain. Rotator cuff: Full-thickness, full width tears of the supraspinatus and infraspinatus tendons with over 5 cm of retraction to the base of the glenoid. Severe subscapularis  tendinosis. The teres minor tendon is intact. Muscles: By moderate supraspinatus and infraspinatus muscle atrophy. Severe teres minor muscle atrophy. Biceps long head: Intact and normally positioned. Moderate intra-articular tendinosis. 5 mm intra-articular body within the bicipital groove (series 6, image 19). Acromioclavicular Joint: Moderate arthropathy of the acromioclavicular joint. Type II acromion. Moderate amount of fluid in the subacromial/subdeltoid bursa. Glenohumeral Joint: Scattered high-grade partial and full-thickness cartilage loss over humeral head and glenoid. Small joint effusion. Labrum:  Diffusely degenerated and torn. Bones: No acute fracture or dislocation. High-riding humeral head. No suspicious bone lesion. Other: None. IMPRESSION: 1. Full-thickness, full width tears of the supraspinatus and infraspinatus tendons with over 5 cm of retraction and moderate muscle atrophy. 2. Severe subscapularis tendinosis. 3. Moderate intra-articular biceps tendinosis. 5 mm intra-articular body within the bicipital groove. 4. Moderate acromioclavicular and glenohumeral osteoarthritis. Electronically Signed   By: Titus Dubin M.D.   On: 05/02/2020 07:45

## 2020-05-28 DIAGNOSIS — I120 Hypertensive chronic kidney disease with stage 5 chronic kidney disease or end stage renal disease: Secondary | ICD-10-CM | POA: Diagnosis not present

## 2020-05-28 DIAGNOSIS — R059 Cough, unspecified: Secondary | ICD-10-CM | POA: Diagnosis not present

## 2020-05-28 DIAGNOSIS — N186 End stage renal disease: Secondary | ICD-10-CM | POA: Diagnosis not present

## 2020-05-28 DIAGNOSIS — R0602 Shortness of breath: Secondary | ICD-10-CM | POA: Diagnosis not present

## 2020-05-28 DIAGNOSIS — Z992 Dependence on renal dialysis: Secondary | ICD-10-CM | POA: Diagnosis not present

## 2020-05-28 DIAGNOSIS — R609 Edema, unspecified: Secondary | ICD-10-CM | POA: Diagnosis not present

## 2020-05-28 DIAGNOSIS — N250 Renal osteodystrophy: Secondary | ICD-10-CM | POA: Diagnosis not present

## 2020-05-28 DIAGNOSIS — U071 COVID-19: Secondary | ICD-10-CM | POA: Diagnosis not present

## 2020-05-28 DIAGNOSIS — D631 Anemia in chronic kidney disease: Secondary | ICD-10-CM | POA: Diagnosis not present

## 2020-05-28 DIAGNOSIS — Z23 Encounter for immunization: Secondary | ICD-10-CM | POA: Diagnosis not present

## 2020-05-28 DIAGNOSIS — B1920 Unspecified viral hepatitis C without hepatic coma: Secondary | ICD-10-CM | POA: Diagnosis not present

## 2020-05-28 DIAGNOSIS — E875 Hyperkalemia: Secondary | ICD-10-CM | POA: Diagnosis not present

## 2020-05-28 DIAGNOSIS — E1122 Type 2 diabetes mellitus with diabetic chronic kidney disease: Secondary | ICD-10-CM | POA: Diagnosis not present

## 2020-05-28 DIAGNOSIS — I350 Nonrheumatic aortic (valve) stenosis: Secondary | ICD-10-CM | POA: Diagnosis not present

## 2020-05-28 LAB — CBC
HCT: 27.9 % — ABNORMAL LOW (ref 36.0–46.0)
Hemoglobin: 8.8 g/dL — ABNORMAL LOW (ref 12.0–15.0)
MCH: 26.7 pg (ref 26.0–34.0)
MCHC: 31.5 g/dL (ref 30.0–36.0)
MCV: 84.8 fL (ref 80.0–100.0)
Platelets: 397 10*3/uL (ref 150–400)
RBC: 3.29 MIL/uL — ABNORMAL LOW (ref 3.87–5.11)
RDW: 17.8 % — ABNORMAL HIGH (ref 11.5–15.5)
WBC: 7.2 10*3/uL (ref 4.0–10.5)
nRBC: 0 % (ref 0.0–0.2)

## 2020-05-28 LAB — COMPREHENSIVE METABOLIC PANEL
ALT: 11 U/L (ref 0–44)
AST: 17 U/L (ref 15–41)
Albumin: 3.3 g/dL — ABNORMAL LOW (ref 3.5–5.0)
Alkaline Phosphatase: 50 U/L (ref 38–126)
Anion gap: 13 (ref 5–15)
BUN: 14 mg/dL (ref 8–23)
CO2: 27 mmol/L (ref 22–32)
Calcium: 9 mg/dL (ref 8.9–10.3)
Chloride: 98 mmol/L (ref 98–111)
Creatinine, Ser: 3.69 mg/dL — ABNORMAL HIGH (ref 0.44–1.00)
GFR, Estimated: 13 mL/min — ABNORMAL LOW (ref >60)
Glucose, Bld: 80 mg/dL (ref 70–99)
Potassium: 3.7 mmol/L (ref 3.5–5.1)
Sodium: 138 mmol/L (ref 135–145)
Total Bilirubin: 0.4 mg/dL (ref 0.3–1.2)
Total Protein: 7.5 g/dL (ref 6.5–8.1)

## 2020-05-28 MED ORDER — ACETAMINOPHEN 325 MG PO TABS
650.0000 mg | ORAL_TABLET | Freq: Four times a day (QID) | ORAL | Status: AC | PRN
Start: 2020-05-28 — End: ?

## 2020-05-28 MED ORDER — HYDROCORTISONE (PERIANAL) 2.5 % EX CREA: TOPICAL_CREAM | Freq: Three times a day (TID) | CUTANEOUS | 0 refills | Status: DC

## 2020-05-28 MED ORDER — AMLODIPINE BESYLATE 10 MG PO TABS: 10.0000 mg | ORAL_TABLET | Freq: Every day | ORAL | 0 refills | Status: DC

## 2020-05-28 NOTE — Discharge Instructions (Signed)
Follow with Primary MD Janie Morning, DO in 7 days   Get CBC, CMP,  checked  by Primary MD next visit.    Activity: As tolerated with Full fall precautions use walker/cane & assistance as needed   Disposition Home    Diet: Heart Healthy /RENAL DIET  , with feeding assistance and aspiration precautions.  On your next visit with your primary care physician please Get Medicines reviewed and adjusted.   Please request your Prim.MD to go over all Hospital Tests and Procedure/Radiological results at the follow up, please get all Hospital records sent to your Prim MD by signing hospital release before you go home.   If you experience worsening of your admission symptoms, develop shortness of breath, life threatening emergency, suicidal or homicidal thoughts you must seek medical attention immediately by calling 911 or calling your MD immediately  if symptoms less severe.  You Must read complete instructions/literature along with all the possible adverse reactions/side effects for all the Medicines you take and that have been prescribed to you. Take any new Medicines after you have completely understood and accpet all the possible adverse reactions/side effects.   Do not drive, operating heavy machinery, perform activities at heights, swimming or participation in water activities or provide baby sitting services if your were admitted for syncope or siezures until you have seen by Primary MD or a Neurologist and advised to do so again.  Do not drive when taking Pain medications.    Do not take more than prescribed Pain, Sleep and Anxiety Medications  Special Instructions: If you have smoked or chewed Tobacco  in the last 2 yrs please stop smoking, stop any regular Alcohol  and or any Recreational drug use.  Wear Seat belts while driving.   Please note  You were cared for by a hospitalist during your hospital stay. If you have any questions about your discharge medications or the care you  received while you were in the hospital after you are discharged, you can call the unit and asked to speak with the hospitalist on call if the hospitalist that took care of you is not available. Once you are discharged, your primary care physician will handle any further medical issues. Please note that NO REFILLS for any discharge medications will be authorized once you are discharged, as it is imperative that you return to your primary care physician (or establish a relationship with a primary care physician if you do not have one) for your aftercare needs so that they can reassess your need for medications and monitor your lab values.

## 2020-05-28 NOTE — Progress Notes (Signed)
[facility] Progress Note  Subjective: Patient not seen directly today given COVID-19 + status, utilizing data taken from chart +/- discussions w/ providers and staff.    Vitals:   05/27/20 1453 05/27/20 1531 05/27/20 2140 05/28/20 0626  BP: 120/71 122/76 130/75 (!) 164/89  Pulse: 86 100 85 99  Resp: '13 16 16 18  '$ Temp: 97.9 F (36.6 C) 97.7 F (36.5 C) 98.3 F (36.8 C) 98.3 F (36.8 C)  TempSrc: Oral Oral Oral Oral  SpO2: 97% 98% 97% 98%  Weight:      Height:        Exam: Patient not seen directly today given COVID-19 + status, utilizing data taken from chart +/- discussions w/ providers and staff.      [facility] HD: [facility]   3.5h  400/500  42kg  2/2.5 bath Hep none  R AVG  - mircera 60 q2 , last 05/16/20, due 3/15   Assessment/ Plan: 1. Hyperkalemia - 7.0 on admit. SP HD has resolved. 2. Uremia - resolved w/ HD 3. ESRD - on HD [facility]TS. Missed HD due to "arthritis pains". She has considered hospice/ stopping HD, but not ready yet. Had HD here on schedule on Sat. She will be switched to MWF for outpt COVID shift, 4 pm on MWF at her center.  4. COVID 19 + - per pmd 5. Anemia ckd - Hb 8.6, getting esa at OP unit, next dose due 3/15 if still here.  6. MBD ckd - no vdra, phos pending.  7. Dispo - stable for dc from renal standpoint     [name] 05/28/2020, 3:07 PM   Recent Labs  Lab 05/26/20 0135 05/27/20 0429 05/28/20 0140  K 3.8 4.2 3.7  BUN 17 31* 14  CREATININE 3.89* 6.14* 3.69*  CALCIUM 9.1 8.7* 9.0  PHOS 3.1  --   --   HGB 8.9* 8.6* 8.8*   Inpatient medications: . amLODipine  10 mg Oral Daily  . Chlorhexidine Gluconate Cloth  6 each Topical Q0600  . feeding supplement (NEPRO CARB STEADY)  237 mL Oral BID BM  . heparin  5,000 Units Subcutaneous Q8H  . hydrocortisone   Rectal TID   . sodium chloride 250 mL (05/26/20 0207)   sodium chloride, acetaminophen **OR** acetaminophen, alum & mag hydroxide-simeth, guaiFENesin-dextromethorphan,  ondansetron **OR** ondansetron (ZOFRAN) IV, oxyCODONE-acetaminophen **AND** oxyCODONE, oxyCODONE-acetaminophen

## 2020-05-28 NOTE — Discharge Summary (Signed)
[name], is a 70 y.o. female  DOB 01/13/1951  MRN [id number].  Admission date:  05/25/2020  Admitting Physician  Jenna Bulls, MD  Discharge Date:  05/28/2020   Primary MD  Jenna Morning, DO  Recommendations for primary care physician for things to follow:  -Patient to continue hemodialysis on Monday Wednesday Friday schedule after 4 PM till she is off isolation.  Admission Diagnosis  Cough [R05.9] Hyperkalemia [E87.5]   Discharge Diagnosis  Cough [R05.9] Hyperkalemia [E87.5]    Principal Problem:   Hyperkalemia Active Problems:   ESRD (end stage renal disease) ([place])   Hypertension   Tobacco dependence   Anemia due to stage 5 chronic kidney disease ([place])   COVID-19 virus detected   Pressure injury of skin   Cough      Past Medical History:  Diagnosis Date  . Anemia    anemia of chronic disease likely 2/2 ESRD per last anemia panel (02/2011) with Fe 35, TIBC 167, ferritin 2041  // BL Hgb 8-10  . Anuria    Due to dialysis  . Aortic stenosis, mild    03/2011 echo  . Arthritis    Back  . Blood transfusion 1990's   r/t Kidney removal surgery  . Brain aneurysm    No records could be found  . Colitis, ischemic ([place]) 01/2011   S/P partial colectomy of right hemicolon and ileostomy placement  . Diabetes mellitus    Borderline  . Dialysis patient [facility])    [place] Tues,Thursday, saturday  . Dysrhythmia    SVT for brief period in Feb 2013  . End stage renal disease on dialysis [facility])    Secondary to hypertension // T/Th/Sat dialysis on [employer]  . Foot drop, bilateral    after back surgery  . Heart murmur    Born with heart mumur, does not require follow up per pt  . Hepatitis C   . Hypertension    Does not see a heart doctor, had pre transplant stress test at [facility]    . Lumbar spinal stenosis     bilateral L4-5 and L5-S1 posterior lumbar interbody  arthrodesis and bilateral L4-S1 posterior lateral arthrodesis for lumbar stenosis dynamic lumbar spondylolisthesis lumbar spondylosis lumbar radiculopathy with foot drop // s/p surgery 01/2011 - see surgery section for details.  . Seizures ([place])    r/t HTN in 1990's x 1  . Umbilical hernia age 20    Past Surgical History:  Procedure Laterality Date  . A/V FISTULAGRAM Right 02/05/2019   Procedure: A/V FISTULAGRAM;  Surgeon: Katha Cabal, MD;  Location: [place] [facility];  Service: Cardiovascular;  Laterality: Right;  . APPENDECTOMY  1960's  . AV FISTULA PLACEMENT  04/08/2011   Procedure: INSERTION OF ARTERIOVENOUS (AV) GORE-TEX GRAFT ARM;  Surgeon: Rosetta Posner, MD;  Location: [facility] OR;  Service: Vascular;  Laterality: Right;  Revision of Upper Arm Gore-Tex Graft  . BACK SURGERY  01/30/2011   L4-S1 lumbar laminectomy facetectomy and foraminotomies for decompression a  bilateral L4-5 and L5-S1 posterior lumbar interbody arthrodesis and bilateral L4-S1 posterior lateral arthrodesis for lumbar stenosis dynamic lumbar spondylolisthesis lumbar spondylosis lumbar radiculopathy with foot drop  . COLONOSCOPY  02/19/2011   Procedure: COLONOSCOPY;  Surgeon: Missy Sabins, MD;  Location: [place];  Service: Endoscopy;  Laterality: N/A;  . COLONOSCOPY  03/07/2011   Procedure: COLONOSCOPY;  Surgeon: Cleotis Nipper, MD;  Location: [facility] ENDOSCOPY;  Service: Endoscopy;  Laterality: N/A;  no prep needed--pt has long Hartmann's pouch and ileostomy  . COLOSTOMY  02/09/2011   Reason for surgery: ischemic colitis of right hemicolon requiring partial colectomy and ileostomy.  Surgeon: Harl Bowie, MD;  Location: [place];  Service: General;  Laterality: Right;  . ILEOSTOMY CLOSURE  05/29/2011   Procedure: ILEOSTOMY TAKEDOWN;  Surgeon: Harl Bowie, MD;  Location: [place];  Service: General;  Laterality: N/A;  . INSERTION OF DIALYSIS CATHETER Right 04/15/2016   Procedure: INSERTION OF DIALYSIS CATHETER  RIGHT INTERNAL JUGULAR;  Surgeon: Conrad [place], MD;  Location: [place];  Service: Vascular;  Laterality: Right;  . LAPAROTOMY  02/09/2011   Procedure: EXPLORATORY LAPAROTOMY;  Surgeon: Harl Bowie, MD;  Location: [place];  Service: General;  Laterality: N/A;  . NEPHRECTOMY  2010   right side done at [facility] on transplant list  . PARTIAL COLECTOMY  02/09/2011   Reason for surgery: ischemic colitis of right hemicolon; Surgeon: Harl Bowie, MD;  Location: [place];  Service: General;  Laterality: Right;  . REVISION OF ARTERIOVENOUS GORETEX GRAFT Right 04/15/2016   Procedure: REVISION OF ARTERIOVENOUS GORETEX GRAFT USING 75mX20cm GORETEX GRAFT;  Surgeon: BConrad Hemphill MD;  [place]  Service: Vascular;  Laterality: Right;  . RIB RESECTION     d/t kidney removal  . TONSILLECTOMY     as a child  . total parathyroidectomy  01/17/2001   with autotransplantation into left forearm // due to secondary hyperparathyroidism from ESRD  . UMBILICAL HERNIA REPAIR  age 70- 3 . UPPER EXTREMITY ANGIOGRAPHY Right 07/21/2017   Procedure: UPPER EXTREMITY ANGIOGRAPHY;  Surgeon: CWaynetta Sandy MD;  [facility];  Service: Cardiovascular;  Laterality: Right;  .[place] KitchenVASCULAR SURGERY     right arm dialysis graft       History of present illness and  Hospital Course:     Kindly see H&P for history of present illness and admission details, please review complete Labs, Consult reports and Test reports for all details in brief  HPI  from the history and physical done on the day of admission 05/26/2020   HPI: Jenna GARAis a 70y.o. female with medical history significant for ESRD on hemodialysis, chronic anemia, hypertension, chronic pain, current smoker, and hepatitis C, now presenting to the emergency department with increased cough and shortness of breath.  Patient has been poorly adherent to her dialysis schedule with her family reporting that she usually misses 1  session a week and often leaves early.  She has been seen by palliative care recently, confirms that she wishes to be full code and wants to continue with dialysis.  She has had increase in her chronic cough and dyspnea noted over the past month or so and seem to be worse today after missing her dialysis yesterday.  Patient reports shortness of breath worsening insidiously over approximately a month.  She has had a nonproductive cough that is also been worse in recent weeks.  She reports recent onset of bilateral  foot and ankle swelling.  Denies leg tenderness or hemoptysis.  Reports that she had 2 doses of COVID-19 vaccine about a year ago.  [facility] ED Course: Upon arrival to the ED, patient is found to be afebrile, saturating upper 90s to 100% on room air, normal respiratory rate, and stable blood pressure.  EKG features sinus rhythm with first-degree AV nodal block and peaked T waves.  Chest x-ray notable for chronic hyperinflation without acute abnormality.  Chemistry panel concerning for potassium 7.0 and also notable for normal bicarbonate and BUN 71.  CBC features a normocytic anemia with hemoglobin 8.5 and platelets 482,000.  BNP is 290, lower than prior.  COVID-19 PCR was positive.  Patient was treated with nebulized albuterol, IV calcium, IV bicarbonate, Lokelma, and Percocet in the ED.  Nephrology was consulted by the ED physician and recommended medical admission to [place] [facility] Course   Hyperkalemia; ESRD -Presents with increased SOB after missing HD and is found to have serum potassium of 7.0 with peaked T-waves -Renal were consulted, she was dialyzed on admission, hyperkalemia resolved, and her COVID-19 status, her outpatient hemodialysis schedule has been adjusted to Monday Wednesday Friday schedule, so she will be hospitalized again on Saturday, and she is being discharged today to resume her hemodialysis on Monday Wednesday Friday.  COVID-19 -Patient  reports increased SOB and non-productive cough over the course of a month and COVID pcr is positive -There is no fever, no tachypnea, no acute CXR findings, and she has normal RR -treated with 3 days of remdesivir.  Hypertension -BP improved after HD, continue Norvasc  Anemiaof CKD -No overt bleeding, monitor  Patient will be discharged on hydrocortisone cream for hemorrhoidal itching.  Discharge Condition:  stable   Follow UP   [place] Follow up on 05/29/2020.   Why: COVID isolation shift for dialysis is MWF 4:00pm for 14 days from +test. Please call when you arrive to [place]. I expect your first day back to regular schedule will be Saturday 06/10/20. Take Care! Contact information: [place] [address] [phone]                 Discharge Instructions  and  Discharge Medications     Discharge Instructions    Discharge instructions   Complete by: As directed    Follow with Primary MD Jenna Morning, DO in 7 days   Get CBC, CMP,  checked  by Primary MD next visit.    Activity: As tolerated with Full fall precautions use walker/cane & assistance as needed   Disposition Home    Diet: Heart Healthy /RENAL DIET  , with feeding assistance and aspiration precautions.  On your next visit with your primary care physician please Get Medicines reviewed and adjusted.   Please request your Prim.MD to go over all Hospital Tests and Procedure/Radiological results at the follow up, please get all Hospital records sent to your Prim MD by signing hospital release before you go home.   If you experience worsening of your admission symptoms, develop shortness of breath, life threatening emergency, suicidal or homicidal thoughts you must seek medical attention immediately by calling 911 or calling your MD immediately  if symptoms less severe.  You Must read complete instructions/literature along  with all the possible adverse reactions/side effects for all the Medicines you take and that have been prescribed to you. Take any new Medicines after you have completely understood and accpet all  the possible adverse reactions/side effects.   Do not drive, operating heavy machinery, perform activities at heights, swimming or participation in water activities or provide baby sitting services if your were admitted for syncope or siezures until you have seen by Primary MD or a Neurologist and advised to do so again.  Do not drive when taking Pain medications.    Do not take more than prescribed Pain, Sleep and Anxiety Medications  Special Instructions: If you have smoked or chewed Tobacco  in the last 2 yrs please stop smoking, stop any regular Alcohol  and or any Recreational drug use.  Wear Seat belts while driving.   Please note  You were cared for by a hospitalist during your hospital stay. If you have any questions about your discharge medications or the care you received while you were in the hospital after you are discharged, you can call the unit and asked to speak with the hospitalist on call if the hospitalist that took care of you is not available. Once you are discharged, your primary care physician will handle any further medical issues. Please note that NO REFILLS for any discharge medications will be authorized once you are discharged, as it is imperative that you return to your primary care physician (or establish a relationship with a primary care physician if you do not have one) for your aftercare needs so that they can reassess your need for medications and monitor your lab values.   Increase activity slowly   Complete by: As directed    No wound care   Complete by: As directed      Allergies as of 05/28/2020      Reactions   Iodinated Diagnostic Agents Other (See Comments)   Seziure   Hydrocodone Nausea And Vomiting   Metoprolol Nausea And Vomiting   Iohexol Other (See  Comments)   Reaction is convulsions      Medication List    STOP taking these medications   cetirizine 10 MG tablet Commonly known as: ZyrTEC Allergy   fluticasone 50 MCG/ACT nasal spray Commonly known as: FLONASE     TAKE these medications   acetaminophen 325 MG tablet Commonly known as: TYLENOL Take 2 tablets (650 mg total) by mouth every 6 (six) hours as needed for mild pain (or Fever >/= 101). What changed:   medication strength  how much to take  when to take this  reasons to take this   albuterol 108 (90 Base) MCG/ACT inhaler Commonly known as: VENTOLIN HFA Inhale 2 puffs into the lungs every 4 (four) hours as needed for wheezing or shortness of breath.   amLODipine 10 MG tablet Commonly known as: NORVASC Take 1 tablet (10 mg total) by mouth daily. Start taking on: May 29, 2020   hydrocortisone 2.5 % rectal cream Commonly known as: ANUSOL-HC Place rectally 3 (three) times daily. Please apply to hemorrhoids in rectal areax 5 days   oxyCODONE-acetaminophen 10-325 MG tablet Commonly known as: PERCOCET Take 0.5 tablets by mouth 2 (two) times daily as needed. What changed:   how much to take  when to take this  reasons to take this         Diet and Activity recommendation: See Discharge Instructions above   Consults obtained - renal   Major procedures and Radiology Reports - PLEASE review detailed and final reports for all details, in brief -      DG Chest 2 View  Result Date: 05/25/2020 CLINICAL DATA:  Cough and shortness  of breath. EXAM: CHEST - 2 VIEW COMPARISON:  10/25/2019 FINDINGS: Chronic hyperinflation. Persistent but improved bronchial thickening from prior. No acute or confluent airspace disease. Stable heart size and mediastinal contours. Aortic atherosclerosis. Surgical clips project in the posterior mid right hemithorax. Right axillary vascular stent. No acute osseous abnormalities are seen. IMPRESSION: Chronic hyperinflation  without acute abnormality. Electronically Signed   By: Keith Rake M.D.   On: 05/25/2020 16:13   MR SHOULDER LEFT WO CONTRAST  Result Date: 05/02/2020 CLINICAL DATA:  Left shoulder pain for the past 2-3 months. No injury or prior surgery. EXAM: MRI OF THE LEFT SHOULDER WITHOUT CONTRAST TECHNIQUE: Multiplanar, multisequence MR imaging of the shoulder was performed. No intravenous contrast was administered. COMPARISON:  Left shoulder x-rays dated March 02, 2018. FINDINGS: The sagittal T2 fat saturated sequence was not obtained as the patient terminated the exam early due to pain. Rotator cuff: Full-thickness, full width tears of the supraspinatus and infraspinatus tendons with over 5 cm of retraction to the base of the glenoid. Severe subscapularis tendinosis. The teres minor tendon is intact. Muscles: By moderate supraspinatus and infraspinatus muscle atrophy. Severe teres minor muscle atrophy. Biceps long head: Intact and normally positioned. Moderate intra-articular tendinosis. 5 mm intra-articular body within the bicipital groove (series 6, image 19). Acromioclavicular Joint: Moderate arthropathy of the acromioclavicular joint. Type II acromion. Moderate amount of fluid in the subacromial/subdeltoid bursa. Glenohumeral Joint: Scattered high-grade partial and full-thickness cartilage loss over humeral head and glenoid. Small joint effusion. Labrum:  Diffusely degenerated and torn. Bones: No acute fracture or dislocation. High-riding humeral head. No suspicious bone lesion. Other: None. IMPRESSION: 1. Full-thickness, full width tears of the supraspinatus and infraspinatus tendons with over 5 cm of retraction and moderate muscle atrophy. 2. Severe subscapularis tendinosis. 3. Moderate intra-articular biceps tendinosis. 5 mm intra-articular body within the bicipital groove. 4. Moderate acromioclavicular and glenohumeral osteoarthritis. Electronically Signed   By: Titus Dubin M.D.   On: 05/02/2020 07:45     Micro Results     Recent Results (from the past 240 hour(s))  Resp Panel by RT-PCR (Flu A&B, Covid) Nasopharyngeal Swab     Status: Abnormal   Collection Time: 05/25/20  6:40 PM   Specimen: Nasopharyngeal Swab; Nasopharyngeal(NP) swabs in vial transport medium  Result Value Ref Range Status   SARS Coronavirus 2 by RT PCR POSITIVE (A) NEGATIVE Corrected    Comment: RESULT CALLED TO, READ BACK BY AND VERIFIED WITH: ANDREW POWELL CHARGE RN AT 1940 ON 3/10 SG (NOTE) SARS-CoV-2 target nucleic acids are DETECTED.  The SARS-CoV-2 RNA is generally detectable in upper respiratory specimens during the acute phase of infection. Positive results are indicative of the presence of the identified virus, but do not rule out bacterial infection or co-infection with other pathogens not detected by the test. Clinical correlation with patient history and other diagnostic information is necessary to determine patient infection status. The expected result is Negative.  Fact Sheet for Patients: EntrepreneurPulse.com.au  Fact Sheet for Healthcare Providers: IncredibleEmployment.be  This test is not yet approved or cleared by the [place] FDA and  has been authorized for detection and/or diagnosis of SARS-CoV-2 by FDA under an Emergency Use Authorization (EUA).  This EUA will remain in effect (meaning this te st can be used) for the duration of  the COVID-19 declaration under Section 564(b)(1) of the Act, 21 U.S.C. section 360bbb-3(b)(1), unless the authorization is terminated or revoked sooner.  CORRECTED ON 03/11 AT [id number]: PREVIOUSLY REPORTED AS POSITIVE CRITICAL RESULT  CALLED TO, READ BACK BY AND VERIFIED WITH: ANDREW POWELL CHARGE RN AT 1940 ON 3/10 SG    Influenza A by PCR NEGATIVE NEGATIVE Final   Influenza B by PCR NEGATIVE NEGATIVE Final    Comment: (NOTE) The Xpert Xpress SARS-CoV-2/FLU/RSV plus assay is intended as an aid in the diagnosis of  influenza from Nasopharyngeal swab specimens and should not be used as a sole basis for treatment. Nasal washings and aspirates are unacceptable for Xpert Xpress SARS-CoV-2/FLU/RSV testing.  Fact Sheet for Patients: EntrepreneurPulse.com.au  Fact Sheet for Healthcare Providers: IncredibleEmployment.be  This test is not yet approved or cleared by the [place] FDA and has been authorized for detection and/or diagnosis of SARS-CoV-2 by FDA under an Emergency Use Authorization (EUA). This EUA will remain in effect (meaning this test can be used) for the duration of the COVID-19 declaration under Section 564(b)(1) of the Act, 21 U.S.C. section 360bbb-3(b)(1), unless the authorization is terminated or revoked.    MRSA PCR Screening     Status: None   Collection Time: 05/26/20  4:44 AM   Specimen: Nasal Mucosa; Nasopharyngeal  Result Value Ref Range Status   MRSA by PCR NEGATIVE NEGATIVE Final    Comment:        The GeneXpert MRSA Assay (FDA approved for NASAL specimens only), is one component of a comprehensive MRSA colonization surveillance program. It is not intended to diagnose MRSA infection nor to guide or monitor treatment for MRSA infections. Performed at [facility], [place] [address], [place], [place] [address]        Today   Subjective:   Florean Kercheval today has no headache,no chest or abdominal pain,no new weakness tingling or numbness, feels much better wants to go home today.  reports her  hemorrhoidal itch has improved with the hydrocortisone cream.  Objective:   Blood pressure (!) 164/89, pulse 99, temperature 98.3 F (36.8 C), temperature source Oral, resp. rate 18, height '5\' 5"'$  (1.651 m), weight 90.3 kg, SpO2 98 %.   Intake/Output Summary (Last 24 hours) at 05/28/2020 1145 Last data filed at 05/27/2020 1453 Gross per 24 hour  Intake --  Output 1400 ml  Net -1400 ml    Exam Awake Alert, Oriented x 3,  No new F.N deficits, Normal affect Symmetrical Chest wall movement, Good air movement bilaterally, CTAB RRR,No Gallops,Rubs or new Murmurs, No Parasternal Heave +ve B.Sounds, Abd Soft, Non tender,  No rebound -guarding or rigidity. No Cyanosis, Clubbing or edema, No new Rash or bruise  Data Review   CBC w Diff:  Lab Results  Component Value Date   WBC 7.2 05/28/2020   HGB 8.8 (L) 05/28/2020   HCT 27.9 (L) 05/28/2020   PLT 397 05/28/2020   LYMPHOPCT 18 05/25/2020   BANDSPCT 0 02/28/2011   MONOPCT 12 05/25/2020   EOSPCT 4 05/25/2020   BASOPCT 1 05/25/2020    CMP:  Lab Results  Component Value Date   NA 138 05/28/2020   K 3.7 05/28/2020   CL 98 05/28/2020   CO2 27 05/28/2020   BUN 14 05/28/2020   CREATININE 3.69 (H) 05/28/2020   PROT 7.5 05/28/2020   ALBUMIN 3.3 (L) 05/28/2020   BILITOT 0.4 05/28/2020   ALKPHOS 50 05/28/2020   AST 17 05/28/2020   ALT 11 05/28/2020  .   Total Time in preparing paper work, data evaluation and todays exam - 57 minutes  Phillips Climes M.D on 05/28/2020 at 11:45 AM  [place]  [phone]     

## 2020-05-29 ENCOUNTER — Telehealth: Payer: Self-pay | Admitting: Nurse Practitioner

## 2020-05-29 DIAGNOSIS — U071 COVID-19: Secondary | ICD-10-CM | POA: Diagnosis not present

## 2020-05-29 DIAGNOSIS — N2581 Secondary hyperparathyroidism of renal origin: Secondary | ICD-10-CM | POA: Diagnosis not present

## 2020-05-29 DIAGNOSIS — N186 End stage renal disease: Secondary | ICD-10-CM | POA: Diagnosis not present

## 2020-05-29 DIAGNOSIS — Z992 Dependence on renal dialysis: Secondary | ICD-10-CM | POA: Diagnosis not present

## 2020-05-29 NOTE — Telephone Encounter (Cosign Needed)
Transition of care contact from inpatient facility  Date of Discharge: 05/28/20 Date of Contact: 05/29/20 Method of contact: Phone  Attempted to contact patient to discuss transition of care from inpatient admission. Patient did not answer the phone. Message was left on the patient's voicemail with call back number [phone].

## 2020-06-01 DIAGNOSIS — Z992 Dependence on renal dialysis: Secondary | ICD-10-CM | POA: Diagnosis not present

## 2020-06-01 DIAGNOSIS — N2581 Secondary hyperparathyroidism of renal origin: Secondary | ICD-10-CM | POA: Diagnosis not present

## 2020-06-01 DIAGNOSIS — N186 End stage renal disease: Secondary | ICD-10-CM | POA: Diagnosis not present

## 2020-06-03 DIAGNOSIS — N2581 Secondary hyperparathyroidism of renal origin: Secondary | ICD-10-CM | POA: Diagnosis not present

## 2020-06-03 DIAGNOSIS — N186 End stage renal disease: Secondary | ICD-10-CM | POA: Diagnosis not present

## 2020-06-03 DIAGNOSIS — Z992 Dependence on renal dialysis: Secondary | ICD-10-CM | POA: Diagnosis not present

## 2020-06-06 DIAGNOSIS — N186 End stage renal disease: Secondary | ICD-10-CM | POA: Diagnosis not present

## 2020-06-06 DIAGNOSIS — Z992 Dependence on renal dialysis: Secondary | ICD-10-CM | POA: Diagnosis not present

## 2020-06-06 DIAGNOSIS — N2581 Secondary hyperparathyroidism of renal origin: Secondary | ICD-10-CM | POA: Diagnosis not present

## 2020-06-08 DIAGNOSIS — Z992 Dependence on renal dialysis: Secondary | ICD-10-CM | POA: Diagnosis not present

## 2020-06-08 DIAGNOSIS — N2581 Secondary hyperparathyroidism of renal origin: Secondary | ICD-10-CM | POA: Diagnosis not present

## 2020-06-08 DIAGNOSIS — N186 End stage renal disease: Secondary | ICD-10-CM | POA: Diagnosis not present

## 2020-06-11 ENCOUNTER — Encounter (HOSPITAL_COMMUNITY): Payer: Self-pay | Admitting: Emergency Medicine

## 2020-06-11 ENCOUNTER — Inpatient Hospital Stay (HOSPITAL_COMMUNITY)
Admission: EM | Admit: 2020-06-11 | Discharge: 2020-06-17 | DRG: 602 | Disposition: A | Discharge status: Home / Self Care | Payer: Medicare PPO | Attending: Internal Medicine | Admitting: Internal Medicine

## 2020-06-11 ENCOUNTER — Emergency Department (HOSPITAL_COMMUNITY): Payer: Medicare PPO

## 2020-06-11 DIAGNOSIS — N186 End stage renal disease: Secondary | ICD-10-CM | POA: Diagnosis present

## 2020-06-11 DIAGNOSIS — L02611 Cutaneous abscess of right foot: Secondary | ICD-10-CM | POA: Diagnosis not present

## 2020-06-11 DIAGNOSIS — M21372 Foot drop, left foot: Secondary | ICD-10-CM | POA: Diagnosis present

## 2020-06-11 DIAGNOSIS — L03115 Cellulitis of right lower limb: Secondary | ICD-10-CM | POA: Diagnosis not present

## 2020-06-11 DIAGNOSIS — G253 Myoclonus: Secondary | ICD-10-CM | POA: Diagnosis not present

## 2020-06-11 DIAGNOSIS — Z515 Encounter for palliative care: Secondary | ICD-10-CM | POA: Diagnosis not present

## 2020-06-11 DIAGNOSIS — Z9889 Other specified postprocedural states: Secondary | ICD-10-CM | POA: Diagnosis not present

## 2020-06-11 DIAGNOSIS — N2581 Secondary hyperparathyroidism of renal origin: Secondary | ICD-10-CM | POA: Diagnosis present

## 2020-06-11 DIAGNOSIS — Z681 Body mass index (BMI) 19 or less, adult: Secondary | ICD-10-CM | POA: Diagnosis not present

## 2020-06-11 DIAGNOSIS — Z825 Family history of asthma and other chronic lower respiratory diseases: Secondary | ICD-10-CM | POA: Diagnosis not present

## 2020-06-11 DIAGNOSIS — E875 Hyperkalemia: Secondary | ICD-10-CM | POA: Diagnosis not present

## 2020-06-11 DIAGNOSIS — I350 Nonrheumatic aortic (valve) stenosis: Secondary | ICD-10-CM | POA: Diagnosis present

## 2020-06-11 DIAGNOSIS — I10 Essential (primary) hypertension: Secondary | ICD-10-CM | POA: Diagnosis not present

## 2020-06-11 DIAGNOSIS — Z823 Family history of stroke: Secondary | ICD-10-CM | POA: Diagnosis not present

## 2020-06-11 DIAGNOSIS — F17210 Nicotine dependence, cigarettes, uncomplicated: Secondary | ICD-10-CM | POA: Diagnosis present

## 2020-06-11 DIAGNOSIS — N250 Renal osteodystrophy: Secondary | ICD-10-CM | POA: Diagnosis not present

## 2020-06-11 DIAGNOSIS — E1122 Type 2 diabetes mellitus with diabetic chronic kidney disease: Secondary | ICD-10-CM | POA: Diagnosis present

## 2020-06-11 DIAGNOSIS — R109 Unspecified abdominal pain: Secondary | ICD-10-CM | POA: Diagnosis not present

## 2020-06-11 DIAGNOSIS — Z8249 Family history of ischemic heart disease and other diseases of the circulatory system: Secondary | ICD-10-CM | POA: Diagnosis not present

## 2020-06-11 DIAGNOSIS — L0390 Cellulitis, unspecified: Secondary | ICD-10-CM | POA: Diagnosis not present

## 2020-06-11 DIAGNOSIS — M21371 Foot drop, right foot: Secondary | ICD-10-CM | POA: Diagnosis present

## 2020-06-11 DIAGNOSIS — E43 Unspecified severe protein-calorie malnutrition: Secondary | ICD-10-CM | POA: Insufficient documentation

## 2020-06-11 DIAGNOSIS — B182 Chronic viral hepatitis C: Secondary | ICD-10-CM | POA: Diagnosis present

## 2020-06-11 DIAGNOSIS — R140 Abdominal distension (gaseous): Secondary | ICD-10-CM | POA: Diagnosis not present

## 2020-06-11 DIAGNOSIS — D631 Anemia in chronic kidney disease: Secondary | ICD-10-CM | POA: Diagnosis present

## 2020-06-11 DIAGNOSIS — M1990 Unspecified osteoarthritis, unspecified site: Secondary | ICD-10-CM | POA: Diagnosis present

## 2020-06-11 DIAGNOSIS — R102 Pelvic and perineal pain: Secondary | ICD-10-CM

## 2020-06-11 DIAGNOSIS — M069 Rheumatoid arthritis, unspecified: Secondary | ICD-10-CM | POA: Diagnosis present

## 2020-06-11 DIAGNOSIS — Z9049 Acquired absence of other specified parts of digestive tract: Secondary | ICD-10-CM | POA: Diagnosis not present

## 2020-06-11 DIAGNOSIS — R609 Edema, unspecified: Secondary | ICD-10-CM | POA: Diagnosis not present

## 2020-06-11 DIAGNOSIS — Z888 Allergy status to other drugs, medicaments and biological substances status: Secondary | ICD-10-CM | POA: Diagnosis not present

## 2020-06-11 DIAGNOSIS — E892 Postprocedural hypoparathyroidism: Secondary | ICD-10-CM | POA: Diagnosis present

## 2020-06-11 DIAGNOSIS — R194 Change in bowel habit: Secondary | ICD-10-CM | POA: Diagnosis not present

## 2020-06-11 DIAGNOSIS — G8929 Other chronic pain: Secondary | ICD-10-CM | POA: Diagnosis present

## 2020-06-11 DIAGNOSIS — Z7189 Other specified counseling: Secondary | ICD-10-CM | POA: Diagnosis not present

## 2020-06-11 DIAGNOSIS — S91301A Unspecified open wound, right foot, initial encounter: Secondary | ICD-10-CM | POA: Diagnosis not present

## 2020-06-11 DIAGNOSIS — M7989 Other specified soft tissue disorders: Secondary | ICD-10-CM | POA: Diagnosis not present

## 2020-06-11 DIAGNOSIS — Z8616 Personal history of COVID-19: Secondary | ICD-10-CM

## 2020-06-11 DIAGNOSIS — Z885 Allergy status to narcotic agent status: Secondary | ICD-10-CM | POA: Diagnosis not present

## 2020-06-11 DIAGNOSIS — Z833 Family history of diabetes mellitus: Secondary | ICD-10-CM | POA: Diagnosis not present

## 2020-06-11 DIAGNOSIS — I120 Hypertensive chronic kidney disease with stage 5 chronic kidney disease or end stage renal disease: Secondary | ICD-10-CM | POA: Diagnosis not present

## 2020-06-11 DIAGNOSIS — J449 Chronic obstructive pulmonary disease, unspecified: Secondary | ICD-10-CM | POA: Diagnosis present

## 2020-06-11 DIAGNOSIS — R64 Cachexia: Secondary | ICD-10-CM | POA: Diagnosis not present

## 2020-06-11 DIAGNOSIS — Z7401 Bed confinement status: Secondary | ICD-10-CM

## 2020-06-11 DIAGNOSIS — R059 Cough, unspecified: Secondary | ICD-10-CM

## 2020-06-11 DIAGNOSIS — Z992 Dependence on renal dialysis: Secondary | ICD-10-CM | POA: Diagnosis not present

## 2020-06-11 DIAGNOSIS — L89152 Pressure ulcer of sacral region, stage 2: Secondary | ICD-10-CM | POA: Diagnosis present

## 2020-06-11 DIAGNOSIS — R54 Age-related physical debility: Secondary | ICD-10-CM | POA: Diagnosis present

## 2020-06-11 DIAGNOSIS — K5900 Constipation, unspecified: Secondary | ICD-10-CM | POA: Diagnosis present

## 2020-06-11 DIAGNOSIS — M898X9 Other specified disorders of bone, unspecified site: Secondary | ICD-10-CM | POA: Diagnosis present

## 2020-06-11 DIAGNOSIS — Z91041 Radiographic dye allergy status: Secondary | ICD-10-CM

## 2020-06-11 DIAGNOSIS — R079 Chest pain, unspecified: Secondary | ICD-10-CM | POA: Diagnosis not present

## 2020-06-11 LAB — CBC WITH DIFFERENTIAL/PLATELET
Abs Immature Granulocytes: 0.04 10*3/uL (ref 0.00–0.07)
Basophils Absolute: 0.1 10*3/uL (ref 0.0–0.1)
Basophils Relative: 1 %
Eosinophils Absolute: 0.3 10*3/uL (ref 0.0–0.5)
Eosinophils Relative: 4 %
HCT: 30.1 % — ABNORMAL LOW (ref 36.0–46.0)
Hemoglobin: 9.5 g/dL — ABNORMAL LOW (ref 12.0–15.0)
Immature Granulocytes: 1 %
Lymphocytes Relative: 18 %
Lymphs Abs: 1.5 10*3/uL (ref 0.7–4.0)
MCH: 27.1 pg (ref 26.0–34.0)
MCHC: 31.6 g/dL (ref 30.0–36.0)
MCV: 86 fL (ref 80.0–100.0)
Monocytes Absolute: 0.9 10*3/uL (ref 0.1–1.0)
Monocytes Relative: 11 %
Neutro Abs: 5.1 10*3/uL (ref 1.7–7.7)
Neutrophils Relative %: 65 %
Platelets: 394 10*3/uL (ref 150–400)
RBC: 3.5 MIL/uL — ABNORMAL LOW (ref 3.87–5.11)
RDW: 17.3 % — ABNORMAL HIGH (ref 11.5–15.5)
WBC: 7.9 10*3/uL (ref 4.0–10.5)
nRBC: 0 % (ref 0.0–0.2)

## 2020-06-11 LAB — PROTIME-INR
INR: 1 (ref 0.8–1.2)
Prothrombin Time: 13.1 seconds (ref 11.4–15.2)

## 2020-06-11 LAB — COMPREHENSIVE METABOLIC PANEL
ALT: 8 U/L (ref 0–44)
AST: 29 U/L (ref 15–41)
Albumin: 3.4 g/dL — ABNORMAL LOW (ref 3.5–5.0)
Alkaline Phosphatase: 52 U/L (ref 38–126)
Anion gap: 19 — ABNORMAL HIGH (ref 5–15)
BUN: 56 mg/dL — ABNORMAL HIGH (ref 8–23)
CO2: 25 mmol/L (ref 22–32)
Calcium: 8.6 mg/dL — ABNORMAL LOW (ref 8.9–10.3)
Chloride: 93 mmol/L — ABNORMAL LOW (ref 98–111)
Creatinine, Ser: 8.56 mg/dL — ABNORMAL HIGH (ref 0.44–1.00)
GFR, Estimated: 5 mL/min — ABNORMAL LOW (ref >60)
Glucose, Bld: 83 mg/dL (ref 70–99)
Potassium: 6 mmol/L — ABNORMAL HIGH (ref 3.5–5.1)
Sodium: 137 mmol/L (ref 135–145)
Total Bilirubin: 1.8 mg/dL — ABNORMAL HIGH (ref 0.3–1.2)
Total Protein: 8.2 g/dL — ABNORMAL HIGH (ref 6.5–8.1)

## 2020-06-11 LAB — LACTIC ACID, PLASMA
Lactic Acid, Venous: 1.1 mmol/L (ref 0.5–1.9)
Lactic Acid, Venous: 1.6 mmol/L (ref 0.5–1.9)

## 2020-06-11 LAB — APTT: aPTT: 27 seconds (ref 24–36)

## 2020-06-11 LAB — CBG MONITORING, ED: Glucose-Capillary: 96 mg/dL (ref 70–99)

## 2020-06-11 MED ORDER — DEXTROSE 50 % IV SOLN
1.0000 | Freq: Once | INTRAVENOUS | Status: AC
  Administered 2020-06-11: 50 mL via INTRAVENOUS
  Filled 2020-06-11: qty 50

## 2020-06-11 MED ORDER — ONDANSETRON HCL 4 MG PO TABS: 4.0000 mg | ORAL_TABLET | Freq: Four times a day (QID) | ORAL | Status: DC | PRN

## 2020-06-11 MED ORDER — ACETAMINOPHEN 650 MG RE SUPP: 650.0000 mg | Freq: Four times a day (QID) | RECTAL | Status: DC | PRN

## 2020-06-11 MED ORDER — BACITRACIN-NEOMYCIN-POLYMYXIN OINTMENT TUBE
1.0000 "application " | TOPICAL_OINTMENT | Freq: Two times a day (BID) | CUTANEOUS | Status: DC
  Administered 2020-06-12 – 2020-06-17 (×9): 1 via TOPICAL
  Filled 2020-06-11: qty 1
  Filled 2020-06-11: qty 14
  Filled 2020-06-11: qty 1
  Filled 2020-06-11: qty 14

## 2020-06-11 MED ORDER — ACETAMINOPHEN 325 MG PO TABS
650.0000 mg | ORAL_TABLET | Freq: Four times a day (QID) | ORAL | Status: DC | PRN
  Administered 2020-06-12 (×2): 650 mg via ORAL
  Filled 2020-06-11: qty 2

## 2020-06-11 MED ORDER — SODIUM CHLORIDE 0.9 % IV SOLN
1.0000 g | INTRAVENOUS | Status: DC
  Administered 2020-06-11: 1 g via INTRAVENOUS
  Filled 2020-06-11 (×2): qty 10

## 2020-06-11 MED ORDER — SENNA 8.6 MG PO TABS
1.0000 | ORAL_TABLET | Freq: Two times a day (BID) | ORAL | Status: DC
  Administered 2020-06-11 – 2020-06-12 (×2): 8.6 mg via ORAL
  Filled 2020-06-11 (×2): qty 1

## 2020-06-11 MED ORDER — SODIUM CHLORIDE 0.9 % IV SOLN
2.0000 g | Freq: Once | INTRAVENOUS | Status: AC
  Administered 2020-06-11: 2 g via INTRAVENOUS
  Filled 2020-06-11: qty 20

## 2020-06-11 MED ORDER — GUAIFENESIN ER 600 MG PO TB12: 600.0000 mg | ORAL_TABLET | Freq: Two times a day (BID) | ORAL | Status: DC | PRN

## 2020-06-11 MED ORDER — POLYETHYLENE GLYCOL 3350 17 G PO PACK: 17.0000 g | PACK | Freq: Every day | ORAL | Status: DC | PRN

## 2020-06-11 MED ORDER — GUAIFENESIN 100 MG/5ML PO SOLN: 10.0000 mL | Freq: Three times a day (TID) | ORAL | Status: DC | PRN

## 2020-06-11 MED ORDER — INSULIN ASPART 100 UNIT/ML IV SOLN
5.0000 [IU] | Freq: Once | INTRAVENOUS | Status: AC
  Administered 2020-06-11: 5 [IU] via INTRAVENOUS
  Filled 2020-06-11: qty 0.05

## 2020-06-11 MED ORDER — VANCOMYCIN HCL IN DEXTROSE 1-5 GM/200ML-% IV SOLN
1000.0000 mg | Freq: Once | INTRAVENOUS | Status: AC
  Administered 2020-06-11: 1000 mg via INTRAVENOUS
  Filled 2020-06-11: qty 200

## 2020-06-11 MED ORDER — VANCOMYCIN HCL 1000 MG/200ML IV SOLN
1000.0000 mg | Freq: Once | INTRAVENOUS | Status: AC
  Administered 2020-06-12: 1000 mg via INTRAVENOUS
  Filled 2020-06-11: qty 200

## 2020-06-11 MED ORDER — SODIUM CHLORIDE 0.9 % IV SOLN: 2.0000 g | INTRAVENOUS | Status: DC

## 2020-06-11 MED ORDER — MINERAL OIL RE ENEM: 1.0000 | ENEMA | Freq: Once | RECTAL | Status: DC

## 2020-06-11 MED ORDER — VANCOMYCIN HCL 500 MG/100ML IV SOLN: 500.0000 mg | INTRAVENOUS | Status: DC

## 2020-06-11 MED ORDER — GUAIFENESIN 100 MG/5ML PO LIQD
200.0000 mg | Freq: Three times a day (TID) | ORAL | Status: DC | PRN
  Filled 2020-06-11: qty 10

## 2020-06-11 MED ORDER — HYDRALAZINE HCL 20 MG/ML IJ SOLN: 10.0000 mg | Freq: Four times a day (QID) | INTRAMUSCULAR | Status: DC | PRN

## 2020-06-11 MED ORDER — SODIUM ZIRCONIUM CYCLOSILICATE 10 G PO PACK
10.0000 g | PACK | Freq: Once | ORAL | Status: AC
  Administered 2020-06-11: 10 g via ORAL
  Filled 2020-06-11: qty 1

## 2020-06-11 MED ORDER — ALBUTEROL SULFATE (2.5 MG/3ML) 0.083% IN NEBU
2.5000 mg | INHALATION_SOLUTION | Freq: Four times a day (QID) | RESPIRATORY_TRACT | Status: DC
  Administered 2020-06-11 – 2020-06-12 (×3): 2.5 mg via RESPIRATORY_TRACT
  Filled 2020-06-11 (×4): qty 3

## 2020-06-11 MED ORDER — HEPARIN SODIUM (PORCINE) 5000 UNIT/ML IJ SOLN
5000.0000 [IU] | Freq: Three times a day (TID) | INTRAMUSCULAR | Status: DC
  Administered 2020-06-11 – 2020-06-17 (×14): 5000 [IU] via SUBCUTANEOUS
  Filled 2020-06-11 (×14): qty 1

## 2020-06-11 MED ORDER — BISACODYL 5 MG PO TBEC: 5.0000 mg | DELAYED_RELEASE_TABLET | Freq: Every day | ORAL | Status: DC | PRN

## 2020-06-11 MED ORDER — ONDANSETRON HCL 4 MG/2ML IJ SOLN: 4.0000 mg | Freq: Four times a day (QID) | INTRAMUSCULAR | Status: DC | PRN

## 2020-06-11 MED ORDER — CHLORHEXIDINE GLUCONATE CLOTH 2 % EX PADS
6.0000 | MEDICATED_PAD | Freq: Every day | CUTANEOUS | Status: DC
  Administered 2020-06-12 – 2020-06-17 (×4): 6 via TOPICAL

## 2020-06-11 MED ORDER — OXYCODONE-ACETAMINOPHEN 5-325 MG PO TABS
ORAL_TABLET | Freq: Three times a day (TID) | ORAL | Status: DC | PRN
  Administered 2020-06-11 – 2020-06-12 (×3): 1 via ORAL
  Filled 2020-06-11 (×3): qty 1

## 2020-06-11 NOTE — ED Notes (Signed)
Report called to nurse Gwenlyn Perking  for [facility] transfer [facility].

## 2020-06-11 NOTE — ED Triage Notes (Signed)
Patient here from reporting multiple wounds that "need attention".  1 on foot that is oozing, 1 on leg, and 1 on bottom. Denies hx of diabetes but does report ESKD with dialysis.

## 2020-06-11 NOTE — ED Provider Notes (Signed)
[facility] DEPT Provider Note   CSN: [id number] Arrival date & time: 06/11/20  1402     History Chief Complaint  Patient presents with  . Wound Check    [name] is a 70 y.o. female.  HPI 70 year old female presents with right foot wound.  History is mostly from the daughter at the bedside.  She has been noticing that the foot is leaking and red and swollen since yesterday.  She also has a couple spots on her right lower leg as well as chronic wound to her buttocks.  The sacral wound has been there for a long time and has been chronically giving her pain.  However does not visually appear worse.  No fevers or shortness of breath.  Last went to dialysis on 3/24 and then missed yesterday because of pain and is due to go tomorrow to make it up.  The patient has poor feeling in her legs and so she is not having any pain with this.   Past Medical History:  Diagnosis Date  . Anemia    anemia of chronic disease likely 2/2 ESRD per last anemia panel (02/2011) with Fe 35, TIBC 167, ferritin 2041  // BL Hgb 8-10  . Anuria    Due to dialysis  . Aortic stenosis, mild    03/2011 echo  . Arthritis    Back  . Blood transfusion 1990's   r/t Kidney removal surgery  . Brain aneurysm    No records could be found  . Colitis, ischemic ([place]) 01/2011   S/P partial colectomy of right hemicolon and ileostomy placement  . Diabetes mellitus    Borderline  . Dialysis patient [facility])    [place] Tues,Thursday, saturday  . Dysrhythmia    SVT for brief period in Feb 2013  . End stage renal disease on dialysis [facility])    Secondary to hypertension // T/Th/Sat dialysis on [employer]  . Foot drop, bilateral    after back surgery  . Heart murmur    Born with heart mumur, does not require follow up per pt  . Hepatitis C   . Hypertension    Does not see a heart doctor, had pre transplant stress test at [facility]    . Lumbar spinal stenosis     bilateral L4-5  and L5-S1 posterior lumbar interbody arthrodesis and bilateral L4-S1 posterior lateral arthrodesis for lumbar stenosis dynamic lumbar spondylolisthesis lumbar spondylosis lumbar radiculopathy with foot drop // s/p surgery 01/2011 - see surgery section for details.  . Seizures ([place])    r/t HTN in 1990's x 1  . Umbilical hernia age 38    Patient Active Problem List   Diagnosis Date Noted  . Wound cellulitis 06/11/2020  . Cough   . Hyperkalemia 05/25/2020  . Anemia due to stage 5 chronic kidney disease ([place]) 05/25/2020  . COVID-19 virus detected 05/25/2020  . Pressure injury of skin 05/25/2020  . Pain due to onychomycosis of toenails of both feet 08/11/2019  . Acute metabolic encephalopathy   . Pneumonia 02/25/2019  . Uremia 02/25/2019  . HCAP (healthcare-associated pneumonia) 02/23/2019  . Complication of vascular access for dialysis 01/17/2019  . Rheumatoid arthritis ([place]) 11/16/2018  . Complication of vascular dialysis catheter 04/15/2016  . At high risk for falls 05/19/2015  . Bilateral foot-drop 05/19/2015  . History of spontaneous subarachnoid intracranial hemorrhage due to cerebral aneurysm 05/19/2015  . Tobacco dependence 05/19/2015  . Underimmunization status 05/06/2015  . Headache 12/08/2013  .  Fever, unspecified 12/08/2013  . Pruritus, unspecified 12/08/2013  . Diarrhea, unspecified 12/02/2013  . Pain, unspecified 12/02/2013  . Coagulation defect, unspecified ([place]) 11/29/2013  . Muscle weakness (generalized) 05/15/2012  . Abnormality of gait 05/15/2012  . Other acquired deformity of ankle and foot(736.79) 05/15/2012  . Iron deficiency anemia, unspecified 02/17/2012  . Lumbar radiculitis 01/17/2012  . Post herpetic neuralgia 11/25/2011  . Leg pain, right 10/14/2011  . Valtrex overdose 10/01/2011  . Shingles rash 09/30/2011  . Body mass index (BMI) less than or equal to 19 in adult 07/10/2011  . Unspecified protein-calorie malnutrition ([employer]) 06/12/2011  . Preop  cardiovascular exam 05/28/2011  . Sinus tachycardia 04/23/2011  . Aortic stenosis 04/23/2011  . Ileostomy in place [facility]) 04/22/2011  . Other specified cardiac arrhythmias 04/04/2011  . Other acquired deformities of unspecified foot 03/22/2011  . Anemia of chronic kidney failure 02/09/2011  . Acute ischemic colitis ([place]) 02/09/2011  . Neuralgia and neuritis, unspecified 02/01/2011  . Lumbar stenosis with neurogenic claudication 01/30/2011  . Spondylolisthesis of lumbar region 01/30/2011  . ESRD (end stage renal disease) ([place]) 01/30/2011  . Hypertension 01/30/2011  . Unspecified viral hepatitis C without hepatic coma 01/30/2011  . Hepatitis C antibody test positive 01/30/2011  . Alcohol abuse, uncomplicated 123XX123  . Awaiting organ transplant status 03/17/2009  . Benign neoplasm of unspecified kidney 03/17/2009  . Disorder of phosphorus metabolism, unspecified 03/17/2009  . Hypercalcemia 03/17/2009  . Other long term (current) drug therapy 03/17/2009  . Personal history of pneumonia (recurrent) 03/17/2009  . Patient's noncompliance with other medical treatment and regimen 03/17/2009  . Polycystic kidney, adult type 03/17/2009  . Acquired absence of kidney 06/21/2008  . Disorder of kidney and ureter, unspecified 06/14/2008  . Other abnormal glucose 05/06/2006  . Type 2 diabetes mellitus without complications ([place]) 123XX123  . Secondary hyperparathyroidism of renal origin ([place]) 12/17/2002  . Postprocedural hypoparathyroidism ([place]) 01/16/2001  . Contact with and (suspected) exposure to potentially hazardous body fluids 03/17/2000  . Other intervertebral disc degeneration, lumbosacral region 09/06/1999  . Allergy status to other drugs, medicaments and biological substances status 02/03/1998  . Hypertensive chronic kidney disease with stage 5 chronic kidney disease or end stage renal disease ([place]) 02/03/1998  . Nonrheumatic mitral (valve) insufficiency 02/03/1998  . Dependence on  renal dialysis ([place]) 03/18/1992    Past Surgical History:  Procedure Laterality Date  . A/V FISTULAGRAM Right 02/05/2019   Procedure: A/V FISTULAGRAM;  Surgeon: Katha Cabal, MD;  Location: [facility] CV LAB;  Service: Cardiovascular;  Laterality: Right;  . APPENDECTOMY  1960's  . AV FISTULA PLACEMENT  04/08/2011   Procedure: INSERTION OF ARTERIOVENOUS (AV) GORE-TEX GRAFT ARM;  Surgeon: Rosetta Posner, MD;  Location: [facility] OR;  Service: Vascular;  Laterality: Right;  Revision of Upper Arm Gore-Tex Graft  . BACK SURGERY  01/30/2011   L4-S1 lumbar laminectomy facetectomy and foraminotomies for decompression a bilateral L4-5 and L5-S1 posterior lumbar interbody arthrodesis and bilateral L4-S1 posterior lateral arthrodesis for lumbar stenosis dynamic lumbar spondylolisthesis lumbar spondylosis lumbar radiculopathy with foot drop  . COLONOSCOPY  02/19/2011   Procedure: COLONOSCOPY;  Surgeon: Missy Sabins, MD;  Location: [facility];  Service: Endoscopy;  Laterality: N/A;  . COLONOSCOPY  03/07/2011   Procedure: COLONOSCOPY;  Surgeon: Cleotis Nipper, MD;  Location: [facility] END[facility]COPY;  Service: Endoscopy;  Laterality: N/A;  no prep needed--pt has long Hartmann's pouch and ileostomy  . COLOSTOMY  02/09/2011   Reason for surgery: ischemic colitis of right hemicolon requiring  partial colectomy and ileostomy.  Surgeon: Harl Bowie, MD;  Location: [facility];  Service: General;  Laterality: Right;  . ILEOSTOMY CLOSURE  05/29/2011   Procedure: ILEOSTOMY TAKEDOWN;  Surgeon: Harl Bowie, MD;  Location: [facility];  Service: General;  Laterality: N/A;  . INSERTION OF DIALYSIS CATHETER Right 04/15/2016   Procedure: INSERTION OF DIALYSIS CATHETER RIGHT INTERNAL JUGULAR;  Surgeon: Conrad Riverside, MD;  Location: [facility] [place];  Service: Vascular;  Laterality: Right;  . LAPAROTOMY  02/09/2011   Procedure: EXPLORATORY LAPAROTOMY;  Surgeon: Harl Bowie, MD;  Location: [facility];  Service: General;  Laterality: N/A;  .  NEPHRECTOMY  2010   right side done at [facility] on transplant list  . PARTIAL COLECTOMY  02/09/2011   Reason for surgery: ischemic colitis of right hemicolon; Surgeon: Harl Bowie, MD;  Location: [facility];  Service: General;  Laterality: Right;  . REVISION OF ARTERIOVENOUS GORETEX GRAFT Right 04/15/2016   Procedure: REVISION OF ARTERIOVENOUS GORETEX GRAFT USING 68mX20cm GORETEX GRAFT;  Surgeon: BConrad Salem MD;  Location: [facility]  Service: Vascular;  Laterality: Right;  . RIB RESECTION     d/t kidney removal  . TONSILLECTOMY     as a child  . total parathyroidectomy  01/17/2001   with autotransplantation into left forearm // due to secondary hyperparathyroidism from ESRD  . UMBILICAL HERNIA REPAIR  age 70- 89 . UPPER EXTREMITY ANGIOGRAPHY Right 07/21/2017   Procedure: UPPER EXTREMITY ANGIOGRAPHY;  Surgeon: CWaynetta Sandy MD;  [facility];  Service: Cardiovascular;  Laterality: Right;  .[place] KitchenVASCULAR SURGERY     right arm dialysis graft     OB History   No obstetric history on file.     Family History  Problem Relation Age of Onset  . Diabetes Father   . Stroke Mother   . COPD Brother   . Heart attack Brother   . Bronchitis Sister   . Anesthesia problems Neg Hx   . Hypotension Neg Hx   . Malignant hyperthermia Neg Hx   . Pseudochol deficiency Neg Hx     Social History   Tobacco Use  . Smoking status: Current Every Day Smoker    Packs/day: 0.50    Years: 48.00    Pack years: 24.00    Types: Cigarettes  . Smokeless tobacco: Never Used  . Tobacco comment: 6 cigarettes per day  Substance Use Topics  . Alcohol use: Yes    Alcohol/week: 3.0 - 4.0 standard drinks    Types: 3 - 4 Shots of liquor per week    Comment: 3-4 shots of liquor on Fridays  . Drug use: No    Home Medications Prior to Admission medications   Medication Sig Start Date End Date Taking? Authorizing Provider  acetaminophen (TYLENOL) 325 MG tablet Take 2 tablets (650 mg  total) by mouth every 6 (six) hours as needed for mild pain (or Fever >/= 101). 05/28/20  Yes Elgergawy, DSilver Huguenin MD  amoxicillin (AMOXIL) 500 MG capsule Take 500 mg by mouth every 8 (eight) hours as needed. Tooth pain 05/12/20  Yes [provider]  guaiFENesin (MUCINEX) 600 MG 12 hr tablet Take 600 mg by mouth 2 (two) times daily as needed for cough.   Yes [provider]  guaiFENesin (ROBITUSSIN) 100 MG/5ML liquid Take 200 mg by mouth 3 (three) times daily as needed for cough.   Yes [provider]  neomycin-bacitracin-polymyxin (NEOSPORIN) ointment Apply 1 application topically 2 (  two) times daily. Butt sores   Yes [provider]  oxyCODONE-acetaminophen (PERCOCET) 10-325 MG tablet Take 0.5 tablets by mouth 2 (two) times daily as needed. Patient taking differently: Take 1 tablet by mouth 4 (four) times daily as needed for pain. 03/04/19  Yes Aline August, MD    Allergies    Iodinated diagnostic agents, Hydrocodone, Metoprolol, and Iohexol  Review of Systems   Review of Systems  Constitutional: Negative for fever.  Respiratory: Negative for shortness of breath.   Cardiovascular: Positive for leg swelling.  Skin: Positive for color change and wound.  All other systems reviewed and are negative.   Physical Exam Updated Vital Signs BP (!) 156/92   Pulse 98   Temp 98.1 F (36.7 C) (Oral)   Resp 20   Ht '5\' 5"'$  (1.651 m)   Wt 45.4 kg   SpO2 96%   BMI 16.64 kg/m   Physical Exam Vitals and nursing note reviewed.  Constitutional:      General: She is not in acute distress.    Appearance: She is well-developed. She is not ill-appearing or diaphoretic.  HENT:     Head: Normocephalic and atraumatic.     Right Ear: External ear normal.     Left Ear: External ear normal.     Nose: Nose normal.  Eyes:     General:        Right eye: No discharge.        Left eye: No discharge.  Cardiovascular:     Rate and Rhythm: Normal rate and regular rhythm.      Pulses:          Dorsalis pedis pulses are detected w/ Doppler on the right side.  Pulmonary:     Effort: Pulmonary effort is normal.  Abdominal:     General: There is no distension.  Musculoskeletal:     Comments: Mild and superficial sacral wound 2 small lesions to right lateral lower leg, do not appear acutely infected Right foot is diffusely swollen and erythematous Distal foot before the toes has some skin break down and sero-sanguinous drainage The right lower leg does appear more swollen compared to left  Skin:    General: Skin is warm and dry.  Neurological:     Mental Status: She is alert.  Psychiatric:        Mood and Affect: Mood is not anxious.     ED Results / Procedures / Treatments   Labs (all labs ordered are listed, but only abnormal results are displayed) Labs Reviewed  COMPREHENSIVE METABOLIC PANEL - Abnormal; Notable for the following components:      Result Value   Potassium 6.0 (*)    Chloride 93 (*)    BUN 56 (*)    Creatinine, Ser 8.56 (*)    Calcium 8.6 (*)    Total Protein 8.2 (*)    Albumin 3.4 (*)    Total Bilirubin 1.8 (*)    GFR, Estimated 5 (*)    Anion gap 19 (*)    All other components within normal limits  CBC WITH DIFFERENTIAL/PLATELET - Abnormal; Notable for the following components:   RBC 3.50 (*)    Hemoglobin 9.5 (*)    HCT 30.1 (*)    RDW 17.3 (*)    All other components within normal limits  CULTURE, BLOOD (ROUTINE X 2)  CULTURE, BLOOD (ROUTINE X 2)  LACTIC ACID, PLASMA  PROTIME-INR  APTT  LACTIC ACID, PLASMA  CBG MONITORING, ED  EKG EKG Interpretation  Date/Time:  Sunday June 11 2020 15:00:52 EDT Ventricular Rate:  94 PR Interval:    QRS Duration: 85 QT Interval:  345 QTC Calculation: 432 R Axis:   50 Text Interpretation: Sinus rhythm Prolonged PR interval Consider left atrial enlargement Probable anterior infarct, recent T waves are not as large/peaked as May 25 2020 but similar to Dec 2020 Confirmed by  Sherwood Gambler [phone]) on 06/11/2020 4:09:15 PM   Radiology DG Foot Complete Right  Result Date: 06/11/2020 CLINICAL DATA:  RIGHT foot wound, lower extremity swelling for 1 week, decreased sensation in RIGHT foot, history diabetes mellitus EXAM: RIGHT FOOT COMPLETE - 3+ VIEW COMPARISON:  11/03/2011 FINDINGS: Marked osseous demineralization. Joint spaces preserved. Scattered small vessel vascular calcification and soft tissue swelling. No acute fracture, dislocation, or bone destruction. IMPRESSION: Marked osseous demineralization and soft tissue swelling without acute bony findings. Extensive small vessel vascular calcification. Electronically Signed   By: Lavonia Dana M.D.   On: 06/11/2020 15:30   VAS [place] LOWER EXTREMITY VENOUS (DVT) (ONLY [facility] & [facility] 7a-7p)  Result Date: 06/11/2020  Lower Venous DVT Study Indications: Edema.  Comparison Study: no prior Performing Technologist: Abram Sander RVS  Examination Guidelines: A complete evaluation includes B-mode imaging, spectral Doppler, color Doppler, and power Doppler as needed of all accessible portions of each vessel. Bilateral testing is considered an integral part of a complete examination. Limited examinations for reoccurring indications may be performed as noted. The reflux portion of the exam is performed with the patient in reverse Trendelenburg.  +---------+---------------+---------+-----------+----------+--------------+ RIGHT    CompressibilityPhasicitySpontaneityPropertiesThrombus Aging +---------+---------------+---------+-----------+----------+--------------+ CFV      Full           Yes      Yes                                 +---------+---------------+---------+-----------+----------+--------------+ SFJ      Full                                                        +---------+---------------+---------+-----------+----------+--------------+ FV Prox  Full                                                         +---------+---------------+---------+-----------+----------+--------------+ FV Mid   Full                                                        +---------+---------------+---------+-----------+----------+--------------+ FV DistalFull                                                        +---------+---------------+---------+-----------+----------+--------------+ PFV      Full                                                        +---------+---------------+---------+-----------+----------+--------------+  POP      Full           Yes      Yes                                 +---------+---------------+---------+-----------+----------+--------------+ PTV      Full                                                        +---------+---------------+---------+-----------+----------+--------------+ PERO     Full                                                        +---------+---------------+---------+-----------+----------+--------------+   +----+---------------+---------+-----------+----------+--------------+ LEFTCompressibilityPhasicitySpontaneityPropertiesThrombus Aging +----+---------------+---------+-----------+----------+--------------+ CFV Full           Yes      Yes                                 +----+---------------+---------+-----------+----------+--------------+     Summary: RIGHT: - There is no evidence of deep vein thrombosis in the lower extremity.  - No cystic structure found in the popliteal fossa.  LEFT: - No evidence of common femoral vein obstruction.  *See table(s) above for measurements and observations.    Preliminary     Procedures Ultrasound ED Peripheral IV (Provider)  Date/Time: 06/11/2020 3:59 PM Performed by: Sherwood Gambler, MD Authorized by: Sherwood Gambler, MD   Procedure details:    Indications: multiple failed IV attempts and poor IV access     Skin Prep: chlorhexidine gluconate     Location:  Left anterior forearm    Angiocath:  20 G   Bedside Ultrasound Guided: Yes     Patient tolerated procedure without complications: Yes     Dressing applied: Yes    .Critical Care Performed by: Sherwood Gambler, MD Authorized by: Sherwood Gambler, MD   Critical care provider statement:    Critical care time (minutes):  35   Critical care time was exclusive of:  Separately billable procedures and treating other patients   Critical care was necessary to treat or prevent imminent or life-threatening deterioration of the following conditions:  Metabolic crisis   Critical care was time spent personally by me on the following activities:  Discussions with consultants, evaluation of patient's response to treatment, examination of patient, ordering and performing treatments and interventions, ordering and review of laboratory studies, ordering and review of radiographic studies, pulse oximetry, re-evaluation of patient's condition, obtaining history from patient or surrogate and review of old charts     Medications Ordered in ED Medications  vancomycin (VANCOREADY) IVPB 500 mg/100 mL (has no administration in time range)  cefTRIAXone (ROCEPHIN) 2 g in sodium chloride 0.9 % 100 mL IVPB (has no administration in time range)  mineral oil enema 1 enema (has no administration in time range)  bisacodyl (DULCOLAX) EC tablet 5 mg (has no administration in time range)  vancomycin (VANCOCIN) IVPB 1000 mg/200 mL premix (1,000 mg Intravenous New Bag/Given 06/11/20 1735)  cefTRIAXone (ROCEPHIN) 2 g in sodium chloride 0.9 % 100  mL IVPB (0 g Intravenous Stopped 06/11/20 1734)  insulin aspart (novoLOG) injection 5 Units (5 Units Intravenous Given 06/11/20 1829)    And  dextrose 50 % solution 50 mL (50 mLs Intravenous Given 06/11/20 1830)  sodium zirconium cyclosilicate (LOKELMA) packet 10 g (10 g Oral Given 06/11/20 1829)    ED Course  I have reviewed the triage vital signs and the nursing notes.  Pertinent labs & imaging results that were  available during my care of the patient were reviewed by me and considered in my medical decision making (see chart for details).    MDM Rules/Calculators/A&P                          Patient's ultrasound is negative for DVT. While her WBC and lactate are ok, her presentation is concerning for acute cellulitis. Given IV antibiotics. She also has hyperkalemia with mildly peaked T waves. Discussed with Dr. Jonnie Finner, who recommends insulin/glucose and lokelma. Admit and get dialysis tomorrow. Admitted to hospitalist service.  Final Clinical Impression(s) / ED Diagnoses Final diagnoses:  Cellulitis of right foot  Hyperkalemia    Rx / DC Orders ED Discharge Orders    None       Sherwood Gambler, MD 06/11/20 [phone]

## 2020-06-11 NOTE — Progress Notes (Signed)
Pharmacy Antibiotic Note  [name] is a 70 y.o. female admitted on 06/11/2020 with cellulitis.  Pharmacy has been consulted for vancomycin dosing. Note patient with ESRD on HD reportedly with sessions on TuThSat.   Plan: Vanc 1g IV x 1 then will start vanc '500mg'$  IV q24 per HD dosing recommendations.  Height: '5\' 5"'$  (165.1 cm) Weight: 45.4 kg (100 lb) IBW/kg (Calculated) : 57  Temp (24hrs), Avg:98.1 F (36.7 C), Min:98.1 F (36.7 C), Max:98.1 F (36.7 C)  Recent Labs  Lab 06/11/20 1447  WBC 7.9  CREATININE 8.56*  LATICACIDVEN 1.1    Estimated Creatinine Clearance: 4.4 mL/min (A) (by C-G formula based on SCr of 8.56 mg/dL (H)).    Allergies  Allergen Reactions  . Iodinated Diagnostic Agents Other (See Comments)    Seziure   . Hydrocodone Nausea And Vomiting  . Metoprolol Nausea And Vomiting  . Iohexol Other (See Comments)    Reaction is convulsions     Thank you for allowing pharmacy to be a part of this patient's care.  Kara Mead 06/11/2020 5:48 PM

## 2020-06-11 NOTE — Progress Notes (Signed)
Lower extremity venous has been completed.   Preliminary results in CV Proc.   Abram Sander 06/11/2020 3:30 PM

## 2020-06-11 NOTE — Progress Notes (Signed)
A consult was received from an ED physician for vanc per pharmacy dosing.  The patient's profile has been reviewed for ht/wt/allergies/indication/available labs.   A one time order has been placed for vanc 1g.  Further antibiotics/pharmacy consults should be ordered by admitting physician if indicated.                       Thank you, Kara Mead 06/11/2020  4:03 PM

## 2020-06-11 NOTE — H&P (Signed)
[facility] History and Physical  [name] [id number] DOB: [birth year] DOA: 06/11/2020 PCP: Janie Morning, DO  Admitted from: home Chief Complaint: Acute right foot wound  History of Present Illness: [name] is a 70 y.o. female with PMH significant for ESRD-HD-TTS on dialysis for last 31 years, hypertension, chronic anemia, chronic hepatitis C, chronic arthritic pain, chronic everyday smoker who was recently hospitalized (3/10-3/13) for mild Covid symptoms, hyperkalemia related to missing dialysis. Since her back surgery in 2021, she has bilateral foot drop is bedbound, has sacral decubitus ulcer and progressive swelling of both lower extremities.  She lives with her grand daughters and is unable to transfer herself out to a wheelchair.  At baseline, mental status is normal, oriented and able to have a meaningful conversation.  Full code. Patient presented today to the ED with complaint of acutely worsening wound on the dorsum of the right foot in last 24 hours.  Daughter at bedside states that yesterday was like a small opening as if 'she had a spider bite or something.' overnight the area got more hot, sloughy and swollen.   No history of diabetes mellitus.  In the ED, patient was afebrile, blood pressure elevated to 156/75  labs showed creatinine significantly up at 8.56, and potassium elevated to 6, missed her recent dialysis, hemoglobin 9.5  DVT scan of the lower extremities negative Patient was started on IV vancomycin and IV Rocephin. Hospitalist service was consulted for inpatient admission and management.  X-ray of the right foot showed marked osseous demineralization and soft tissue swelling without acute bony findings. Extensive small vessel vascular calcification.  At the time of my evaluation, patient was propped up in bed.  Mental status intact, able to give a history.  Helped by daughter at bedside.  No bowel movement in about 10 days  Review of  Systems:  All systems were reviewed and were negative unless otherwise mentioned in the HPI   Past medical history: Past Medical History:  Diagnosis Date  . Anemia    anemia of chronic disease likely 2/2 ESRD per last anemia panel (02/2011) with Fe 35, TIBC 167, ferritin 2041  // BL Hgb 8-10  . Anuria    Due to dialysis  . Aortic stenosis, mild    03/2011 echo  . Arthritis    Back  . Blood transfusion 1990's   r/t Kidney removal surgery  . Brain aneurysm    No records could be found  . Colitis, ischemic ([place]) 01/2011   S/P partial colectomy of right hemicolon and ileostomy placement  . Diabetes mellitus    Borderline  . Dialysis patient [facility])    [place] Tues,Thursday, saturday  . Dysrhythmia    SVT for brief period in Feb 2013  . End stage renal disease on dialysis [facility])    Secondary to hypertension // T/Th/Sat dialysis on [employer]  . Foot drop, bilateral    after back surgery  . Heart murmur    Born with heart mumur, does not require follow up per pt  . Hepatitis C   . Hypertension    Does not see a heart doctor, had pre transplant stress test at [facility]    . Lumbar spinal stenosis     bilateral L4-5 and L5-S1 posterior lumbar interbody arthrodesis and bilateral L4-S1 posterior lateral arthrodesis for lumbar stenosis dynamic lumbar spondylolisthesis lumbar spondylosis lumbar radiculopathy with foot drop // s/p surgery 01/2011 - see surgery section for details.  . Seizures ([place])  r/t HTN in 1990's x 1  . Umbilical hernia age 28    Past surgical history: Past Surgical History:  Procedure Laterality Date  . A/V FISTULAGRAM Right 02/05/2019   Procedure: A/V FISTULAGRAM;  Surgeon: Katha Cabal, MD;  Location: [place] CV LAB;  Service: Cardiovascular;  Laterality: Right;  . APPENDECTOMY  1960's  . AV FISTULA PLACEMENT  04/08/2011   Procedure: INSERTION OF ARTERIOVENOUS (AV) GORE-TEX GRAFT ARM;  Surgeon: Rosetta Posner, MD;  Location[facility];   Service: Vascular;  Laterality: Right;  Revision of Upper Arm Gore-Tex Graft  . BACK SURGERY  01/30/2011   L4-S1 lumbar laminectomy facetectomy and foraminotomies for decompression a bilateral L4-5 and L5-S1 posterior lumbar interbody arthrodesis and bilateral L4-S1 posterior lateral arthrodesis for lumbar stenosis dynamic lumbar spondylolisthesis lumbar spondylosis lumbar radiculopathy with foot drop  . COLONOSCOPY  02/19/2011   Procedure: COLONOSCOPY;  Surgeon: Missy Sabins, MD;  Location: [place];  Service: Endoscopy;  Laterality: N/A;  . COLONOSCOPY  03/07/2011   Procedure: COLONOSCOPY;  Surgeon: Cleotis Nipper, MD;  Location: [facility] ENDOSCOPY;  Service: Endoscopy;  Laterality: N/A;  no prep needed--pt has long Hartmann's pouch and ileostomy  . COLOSTOMY  02/09/2011   Reason for surgery: ischemic colitis of right hemicolon requiring partial colectomy and ileostomy.  Surgeon: Harl Bowie, MD;  Location: [place];  Service: General;  Laterality: Right;  . ILEOSTOMY CLOSURE  05/29/2011   Procedure: ILEOSTOMY TAKEDOWN;  Surgeon: Harl Bowie, MD;  Location: [place];  Service: General;  Laterality: N/A;  . INSERTION OF DIALYSIS CATHETER Right 04/15/2016   Procedure: INSERTION OF DIALYSIS CATHETER RIGHT INTERNAL JUGULAR;  Surgeon: Conrad Orient, MD;  Location: [place];  Service: Vascular;  Laterality: Right;  . LAPAROTOMY  02/09/2011   Procedure: EXPLORATORY LAPAROTOMY;  Surgeon: Harl Bowie, MD;  Location: [place];  Service: General;  Laterality: N/A;  . NEPHRECTOMY  2010   right side done at [facility] on transplant list  . PARTIAL COLECTOMY  02/09/2011   Reason for surgery: ischemic colitis of right hemicolon; Surgeon: Harl Bowie, MD;  Location: [place];  Service: General;  Laterality: Right;  . REVISION OF ARTERIOVENOUS GORETEX GRAFT Right 04/15/2016   Procedure: REVISION OF ARTERIOVENOUS GORETEX GRAFT USING 23mX20cm GORETEX GRAFT;  Surgeon: BConrad Hermleigh MD;  [place]   Service: Vascular;  Laterality: Right;  . RIB RESECTION     d/t kidney removal  . TONSILLECTOMY     as a child  . total parathyroidectomy  01/17/2001   with autotransplantation into left forearm // due to secondary hyperparathyroidism from ESRD  . UMBILICAL HERNIA REPAIR  age 70- 33 . UPPER EXTREMITY ANGIOGRAPHY Right 07/21/2017   Procedure: UPPER EXTREMITY ANGIOGRAPHY;  Surgeon: CWaynetta Sandy MD;  [place];  Service: Cardiovascular;  Laterality: Right;  .Mar[place] KitchenVASCULAR SURGERY     right arm dialysis graft    Social History:  reports that she has been smoking cigarettes. She has a 24.00 pack-year smoking history. She has never used smokeless tobacco. She reports current alcohol use of about 3.0 - 4.0 standard drinks of alcohol per week. She reports that she does not use drugs.  Allergies:  Allergies  Allergen Reactions  . Iodinated Diagnostic Agents Other (See Comments)    Seziure   . Hydrocodone Nausea And Vomiting  . Metoprolol Nausea And Vomiting  . Iohexol Other (See Comments)    Reaction is convulsions  Family history:  Family History  Problem Relation Age of Onset  . Diabetes Father   . Stroke Mother   . COPD Brother   . Heart attack Brother   . Bronchitis Sister   . Anesthesia problems Neg Hx   . Hypotension Neg Hx   . Malignant hyperthermia Neg Hx   . Pseudochol deficiency Neg Hx      Home Meds: Prior to Admission medications   Medication Sig Start Date End Date Taking? Authorizing Provider  acetaminophen (TYLENOL) 325 MG tablet Take 2 tablets (650 mg total) by mouth every 6 (six) hours as needed for mild pain (or Fever >/= 101). 05/28/20  Yes Elgergawy, Silver Huguenin, MD  amoxicillin (AMOXIL) 500 MG capsule Take 500 mg by mouth every 8 (eight) hours as needed. Tooth pain 05/12/20  Yes [provider]  guaiFENesin (MUCINEX) 600 MG 12 hr tablet Take 600 mg by mouth 2 (two) times daily as needed for cough.   Yes [provider]  guaiFENesin (ROBITUSSIN) 100 MG/5ML liquid Take 200 mg by mouth 3 (three) times daily as needed for cough.   Yes [provider]  neomycin-bacitracin-polymyxin (NEOSPORIN) ointment Apply 1 application topically 2 (two) times daily. Butt sores   Yes [provider]  oxyCODONE-acetaminophen (PERCOCET) 10-325 MG tablet Take 0.5 tablets by mouth 2 (two) times daily as needed. Patient taking differently: Take 1 tablet by mouth 4 (four) times daily as needed for pain. 03/04/19  Yes Aline August, MD    Physical Exam: Vitals:   06/11/20 1430 06/11/20 1500 06/11/20 1645  BP: (!) 156/75 (!) 149/70 (!) 156/92  Pulse: 96 97 98  Resp: '17 19 20  '$ Temp: 98.1 F (36.7 C)    TempSrc: Oral    SpO2: 99% 100% 96%  Weight: 45.4 kg    Height: '5\' 5"'$  (1.651 m)     Wt Readings from Last 3 Encounters:  06/11/20 45.4 kg  05/27/20 90.3 kg  06/30/19 54.4 kg   Body mass index is 16.64 kg/m.  General exam: Pleasant elderly African-American female.  Not in physical pain Skin: No rashes, lesions or ulcers. HEENT: Atraumatic, normocephalic, no obvious bleeding Lungs: Clear to auscultation bilaterally CVS: Regular rate and rhythm, systolic ejection murmur [id number] GI/Abd soft, nontender, nondistended, bowel sound present CNS: Alert, awake, oriented x3 Psychiatry: Mood appropriate Extremities: Chronic bilateral lower extremity pedal edema 1+.  Right foot with sloughy wound towards the lateral aspect of forefoot.  No active oozing at this time.     Consult Orders  (From admission, onward)         Start     Ordered   06/11/20 1703  Consult to hospitalist  Once       Provider:  (Not yet assigned)  Question Answer Comment  Place call to: [facility]   Reason for Consult Admit      06/11/20 1702          Labs on Admission:   CBC: Recent Labs  Lab 06/11/20 1447  WBC 7.9  NEUTROABS 5.1  HGB 9.5*  HCT 30.1*  MCV 86.0  PLT [id number]    Basic Metabolic  Panel: Recent Labs  Lab 06/11/20 1447  NA 137  K 6.0*  CL 93*  CO2 25  GLUCOSE 83  BUN 56*  CREATININE 8.56*  CALCIUM 8.6*    Liver Function Tests: Recent Labs  Lab 06/11/20 1447  AST 29  ALT 8  ALKPHOS 52  BILITOT 1.8*  PROT 8.2*  ALBUMIN 3.4*   No results for input(s): LIPASE, AMYLASE in the last 168 hours. No results for input(s): AMMONIA in the last 168 hours.  Cardiac Enzymes: No results for input(s): CKTOTAL, CKMB, CKMBINDEX, TROPONINI in the last 168 hours.  BNP (last 3 results) Recent Labs    05/25/20 1605  BNP 290.4*    ProBNP (last 3 results) No results for input(s): PROBNP in the last 8760 hours.  CBG: No results for input(s): GLUCAP in the last 168 hours.  Lipase  No results found for: LIPASE   Urinalysis No results found for: COLORURINE, APPEARANCEUR, LABSPEC, PHURINE, GLUCOSEU, HGBUR, [place], [place], PROTEINUR, UROBILINOGEN, NITRITE, LEUKOCYTESUR   Drugs of Abuse  No results found for: [place], [place], [place], AMPHETMU, THCU, LABBARB    Radiological Exams on Admission: DG Foot Complete Right  Result Date: 06/11/2020 CLINICAL DATA:  RIGHT foot wound, lower extremity swelling for 1 week, decreased sensation in RIGHT foot, history diabetes mellitus EXAM: RIGHT FOOT COMPLETE - 3+ VIEW COMPARISON:  11/03/2011 FINDINGS: Marked osseous demineralization. Joint spaces preserved. Scattered small vessel vascular calcification and soft tissue swelling. No acute fracture, dislocation, or bone destruction. IMPRESSION: Marked osseous demineralization and soft tissue swelling without acute bony findings. Extensive small vessel vascular calcification. Electronically Signed   By: Lavonia Dana M.D.   On: 06/11/2020 15:30   VAS [place] LOWER EXTREMITY VENOUS (DVT) (ONLY [facility] & [facility] 7a-7p)  Result Date: 06/11/2020  Lower Venous DVT Study Indications: Edema.  Comparison Study: no prior Performing Technologist: Abram Sander RVS  Examination Guidelines: A  complete evaluation includes B-mode imaging, spectral Doppler, color Doppler, and power Doppler as needed of all accessible portions of each vessel. Bilateral testing is considered an integral part of a complete examination. Limited examinations for reoccurring indications may be performed as noted. The reflux portion of the exam is performed with the patient in reverse Trendelenburg.  +---------+---------------+---------+-----------+----------+--------------+ RIGHT    CompressibilityPhasicitySpontaneityPropertiesThrombus Aging +---------+---------------+---------+-----------+----------+--------------+ CFV      Full           Yes      Yes                                 +---------+---------------+---------+-----------+----------+--------------+ SFJ      Full                                                        +---------+---------------+---------+-----------+----------+--------------+ FV Prox  Full                                                        +---------+---------------+---------+-----------+----------+--------------+ FV Mid   Full                                                        +---------+---------------+---------+-----------+----------+--------------+ FV DistalFull                                                        +---------+---------------+---------+-----------+----------+--------------+  PFV      Full                                                        +---------+---------------+---------+-----------+----------+--------------+ POP      Full           Yes      Yes                                 +---------+---------------+---------+-----------+----------+--------------+ PTV      Full                                                        +---------+---------------+---------+-----------+----------+--------------+ PERO     Full                                                         +---------+---------------+---------+-----------+----------+--------------+   +----+---------------+---------+-----------+----------+--------------+ LEFTCompressibilityPhasicitySpontaneityPropertiesThrombus Aging +----+---------------+---------+-----------+----------+--------------+ CFV Full           Yes      Yes                                 +----+---------------+---------+-----------+----------+--------------+     Summary: RIGHT: - There is no evidence of deep vein thrombosis in the lower extremity.  - No cystic structure found in the popliteal fossa.  LEFT: - No evidence of common femoral vein obstruction.  *See table(s) above for measurements and observations.    Preliminary     ------------------------------------------------------------------------------------------------------ Assessment/Plan: Active Problems:   Wound cellulitis  Right foot cellulitis/wound Chronic bilateral lower extremity edema -Patient has chronic bilateral lower extremity wound related to immobility, dialysis dependence and may have peripheral artery disease as well.  -Presented with rapidly progressive cellulitis of the right foot with sloughy wound.   -X-ray of the right foot showed marked osseous demineralization and soft tissue swelling without acute bony findings. Extensive small vessel vascular calcification. -DVT scan of lower extremities negative. -Patient was started on IV Rocephin and IV vancomycin in the ED.  Continue same. -May need evaluation by orthopedic/podiatrist tomorrow.  May need MRI as well.  ESRD-HD-TTS Hyperkalemia -Dialysis dependence for last 31 years.  Missed her last dialysis on Saturday 3/26  -Dr. Jonnie Finner was called by ED. Tentative plan for dialysis tomorrow.  No need of urgent dialysis at this time  Hypertension -Seems to be not taking amlodipine -Blood pressure elevated in 150s. Continue to monitor blood pressure  Chronic anemia -Hemoglobin low between 8 and 9 at  baseline.  Remains stable Recent Labs    05/25/20 0051 05/25/20 1532 05/25/20 1532 05/26/20 0135 05/27/20 0429 05/28/20 0140 06/11/20 1447  HGB  --  8.5*  --  8.9* 8.6* 8.8* 9.5*  MCV  --  85.6   < > 82.8 84.9 84.8 86.0  FERRITIN 1,524*  --   --  1,669*  --   --   --    < > =  values in this interval not displayed.   Chronic hepatitis C Recent Labs  Lab 06/11/20 1447  AST 29  ALT 8  ALKPHOS 52  BILITOT 1.8*  PROT 8.2*  ALBUMIN 3.4*   Chronic arthritic pain -On Percocet 10/325 every 6 hours  Constipation -No bowel movement in 10 to 12 days.  At home, patient is given MiraLAX without benefit.  Agreeable for Dulcolax and enema today.  Chronic smoker  -Counseled to quit  Recent infection with Covid  Goals of care -Patient has been on dialysis for last 31 years, is a total care at home with a developing sacral decubitus ulcers but has intact mental status.  She and her family definitely want to continue dialysis for now.  They are waiting for outpatient palliative care evaluation but would not consider hospice.  Mobility: Bedbound at baseline Code Status:   Code Status: Prior full code for now.  Discussed with patient and her daughter DVT prophylaxis:  Heparin subcu Antimicrobials:  IV Rocephin/IV vancomycin Fluid: None  Diet:  Diet Order            Diet renal with fluid restriction Fluid restriction: 1200 mL Fluid; Room service appropriate? Yes; Fluid consistency: Thin  Diet effective now               Renal diet  Consultants: Nephrology Family Communication:  Daughter at bedside  Dispo: The patient is from: Home              Anticipated d/c is to: Home in 2 to 3 days             ------------------------------------------------------------------------------------- Severity of Illness: The appropriate patient status for this patient is INPATIENT. Inpatient status is judged to be reasonable and necessary in order to provide the required intensity of service to  ensure the patient's safety. The patient's presenting symptoms, physical exam findings, and initial radiographic and laboratory data in the context of their chronic comorbidities is felt to place them at high risk for further clinical deterioration. Furthermore, it is not anticipated that the patient will be medically stable for discharge from the hospital within 2 midnights of admission. The following factors support the patient status of inpatient.   " The patient's presenting symptoms include acutely worsening wound. " The worrisome physical exam findings include right foot cellulitis. " The initial radiographic and laboratory data are worrisome because of infection. " The chronic co-morbidities include dialysis dependence.   * I certify that at the point of admission it is my clinical judgment that the patient will require inpatient hospital care spanning beyond 2 midnights from the point of admission due to high intensity of service, high risk for further deterioration and high frequency of surveillance required.*  Signed, Terrilee Croak, MD [facility] 06/11/2020

## 2020-06-12 ENCOUNTER — Other Ambulatory Visit: Payer: Self-pay

## 2020-06-12 ENCOUNTER — Inpatient Hospital Stay (HOSPITAL_COMMUNITY): Payer: Medicare PPO

## 2020-06-12 DIAGNOSIS — L03115 Cellulitis of right lower limb: Secondary | ICD-10-CM

## 2020-06-12 DIAGNOSIS — E43 Unspecified severe protein-calorie malnutrition: Secondary | ICD-10-CM | POA: Insufficient documentation

## 2020-06-12 LAB — BASIC METABOLIC PANEL
Anion gap: 20 — ABNORMAL HIGH (ref 5–15)
BUN: 64 mg/dL — ABNORMAL HIGH (ref 8–23)
CO2: 19 mmol/L — ABNORMAL LOW (ref 22–32)
Calcium: 8.2 mg/dL — ABNORMAL LOW (ref 8.9–10.3)
Chloride: 96 mmol/L — ABNORMAL LOW (ref 98–111)
Creatinine, Ser: 8.89 mg/dL — ABNORMAL HIGH (ref 0.44–1.00)
GFR, Estimated: 4 mL/min — ABNORMAL LOW (ref >60)
Glucose, Bld: 90 mg/dL (ref 70–99)
Potassium: 5.6 mmol/L — ABNORMAL HIGH (ref 3.5–5.1)
Sodium: 135 mmol/L (ref 135–145)

## 2020-06-12 LAB — CBC
HCT: 26.2 % — ABNORMAL LOW (ref 36.0–46.0)
Hemoglobin: 8.4 g/dL — ABNORMAL LOW (ref 12.0–15.0)
MCH: 26.7 pg (ref 26.0–34.0)
MCHC: 32.1 g/dL (ref 30.0–36.0)
MCV: 83.2 fL (ref 80.0–100.0)
Platelets: 339 10*3/uL (ref 150–400)
RBC: 3.15 MIL/uL — ABNORMAL LOW (ref 3.87–5.11)
RDW: 17.4 % — ABNORMAL HIGH (ref 11.5–15.5)
WBC: 11.1 10*3/uL — ABNORMAL HIGH (ref 4.0–10.5)
nRBC: 0 % (ref 0.0–0.2)

## 2020-06-12 LAB — GLUCOSE, CAPILLARY: Glucose-Capillary: 70 mg/dL (ref 70–99)

## 2020-06-12 LAB — VANCOMYCIN, RANDOM: Vancomycin Rm: 31

## 2020-06-12 MED ORDER — OXYCODONE-ACETAMINOPHEN 5-325 MG PO TABS
1.0000 | ORAL_TABLET | Freq: Four times a day (QID) | ORAL | Status: DC | PRN
  Administered 2020-06-12 – 2020-06-15 (×7): 1 via ORAL
  Filled 2020-06-12 (×6): qty 1

## 2020-06-12 MED ORDER — NEPRO/CARBSTEADY PO LIQD
237.0000 mL | Freq: Two times a day (BID) | ORAL | Status: DC
  Administered 2020-06-12 – 2020-06-13 (×2): 237 mL via ORAL

## 2020-06-12 MED ORDER — SENNOSIDES-DOCUSATE SODIUM 8.6-50 MG PO TABS
1.0000 | ORAL_TABLET | Freq: Two times a day (BID) | ORAL | Status: DC
  Administered 2020-06-12 – 2020-06-17 (×8): 1 via ORAL
  Filled 2020-06-12 (×8): qty 1

## 2020-06-12 MED ORDER — RENA-VITE PO TABS
1.0000 | ORAL_TABLET | Freq: Every day | ORAL | Status: DC
  Administered 2020-06-12 – 2020-06-16 (×5): 1 via ORAL
  Filled 2020-06-12 (×5): qty 1

## 2020-06-12 MED ORDER — ALBUTEROL SULFATE (2.5 MG/3ML) 0.083% IN NEBU
2.5000 mg | INHALATION_SOLUTION | Freq: Two times a day (BID) | RESPIRATORY_TRACT | Status: DC
  Administered 2020-06-12 – 2020-06-13 (×2): 2.5 mg via RESPIRATORY_TRACT
  Filled 2020-06-12 (×2): qty 3

## 2020-06-12 MED ORDER — VANCOMYCIN HCL IN DEXTROSE 500-5 MG/100ML-% IV SOLN
500.0000 mg | INTRAVENOUS | Status: DC
  Administered 2020-06-15: 500 mg via INTRAVENOUS
  Filled 2020-06-12: qty 100

## 2020-06-12 MED ORDER — ACETAMINOPHEN 325 MG PO TABS
ORAL_TABLET | ORAL | Status: AC
  Filled 2020-06-12: qty 2

## 2020-06-12 MED ORDER — MELATONIN 3 MG PO TABS
3.0000 mg | ORAL_TABLET | Freq: Every evening | ORAL | Status: DC | PRN
  Administered 2020-06-12: 3 mg via ORAL
  Filled 2020-06-12: qty 1

## 2020-06-12 MED ORDER — VANCOMYCIN VARIABLE DOSE PER UNSTABLE RENAL FUNCTION (PHARMACIST DOSING): Status: DC

## 2020-06-12 NOTE — Progress Notes (Signed)
Pharmacy Antibiotic Note  [name] is a 70 y.o. female admitted on 06/11/2020 with cellulitis.  Pharmacy has been consulted for vancomycin dosing. Note patient with ESRD on HD reportedly with sessions on [facility].   Post-HD level 31 after two separate doses of '1000mg'$  on 3/27 and 3/28. Will hold vancomycin dose after today's HD and the next HD and resume vancomycin '500mg'$  on Thursday 3/31  Plan: Hold vancomycin until 3/31. Restart vancomycin '500mg'$  IV qTTS on Thursday 3/31 after HD  Height: '5\' 5"'$  (165.1 cm) Weight: 44 kg (97 lb) IBW/kg (Calculated) : 57  Temp (24hrs), Avg:98 F (36.7 C), Min:97.7 F (36.5 C), Max:98.4 F (36.9 C)  Recent Labs  Lab 06/11/20 1447 06/11/20 2045 06/12/20 0315 06/12/20 1432  WBC 7.9  --  11.1*  --   CREATININE 8.56*  --  8.89*  --   LATICACIDVEN 1.1 1.6  --   --   VANCORANDOM  --   --   --  31    Estimated Creatinine Clearance: 4.1 mL/min (A) (by C-G formula based on SCr of 8.89 mg/dL (H)).    Allergies  Allergen Reactions  . Iodinated Diagnostic Agents Other (See Comments)    Seziure   . Hydrocodone Nausea And Vomiting  . Metoprolol Nausea And Vomiting  . Iohexol Other (See Comments)    Reaction is convulsions     Juluis Fitzsimmons A. Levada Dy, PharmD, BCPS, FNKF Clinical Pharmacist [place] Please utilize Amion for appropriate phone number to reach the unit pharmacist ([place])   06/12/2020 3:45 PM

## 2020-06-12 NOTE — Procedures (Addendum)
I was present at this dialysis session. I have reviewed the session itself and made appropriate changes. Some cramping w/ 1 hour left. Gave small bolus. Encouraged to finish treatment  Filed Weights   06/11/20 1430 06/12/20 0727 06/12/20 1015  Weight: 45.4 kg 45 kg 44 kg    Recent Labs  Lab 06/12/20 0315  NA 135  K 5.6*  CL 96*  CO2 19*  GLUCOSE 90  BUN 64*  CREATININE 8.89*  CALCIUM 8.2*    Recent Labs  Lab 06/11/20 1447 06/12/20 0315  WBC 7.9 11.1*  NEUTROABS 5.1  --   HGB 9.5* 8.4*  HCT 30.1* 26.2*  MCV 86.0 83.2  PLT 394 339    Scheduled Meds: . albuterol  2.5 mg Nebulization Q6H  . Chlorhexidine Gluconate Cloth  6 each Topical Q0600  . heparin  5,000 Units Subcutaneous Q8H  . neomycin-bacitracin-polymyxin  1 application Topical BID  . senna  1 tablet Oral BID  . vancomycin variable dose per unstable renal function (pharmacist dosing)   Does not apply See admin instructions   Continuous Infusions: PRN Meds:.acetaminophen **OR** acetaminophen, bisacodyl, guaiFENesin, guaiFENesin, hydrALAZINE, melatonin, ondansetron **OR** ondansetron (ZOFRAN) IV, oxyCODONE-acetaminophen, polyethylene glycol   Santiago Bumpers,  MD 06/12/2020, 10:57 AM

## 2020-06-12 NOTE — Plan of Care (Signed)
  Problem: Pain Managment: Goal: General experience of comfort will improve Outcome: Progressing   Problem: Safety: Goal: Ability to remain free from injury will improve Outcome: Progressing   Problem: Skin Integrity: Goal: Risk for impaired skin integrity will decrease Outcome: Progressing   

## 2020-06-12 NOTE — Progress Notes (Signed)
[facility] Progress Note  Patient: [name]    [id number]  DOA: 06/11/2020     Date of Service: the patient was seen and examined on 06/12/2020  Brief hospital course: Past medical history of ESRD on HD, TTS, HTN, anemia, chronic hep C, presents with complaints of generalized body ache, leg pain and back pain as well as missing HD. Found to have cellulitis of the leg as well as hyperkalemia. Currently plan is rule out deep infection, follow-up on cultures, continue to biotics.  Assessment and Plan: 1.  Right leg cellulitis Bilateral lower extremity edema Patient reports bilateral lower extremity edema. Both legs on exam appears to be warm and red. Right leg more than left. X-ray negative. We will get MRI to rule out any abdominal pain Doppler negative for DVT. Continue with IV antibiotics vancomycin only. Follow-up on cultures. As needed pain medication.  2.  Hyperkalemia. ESRD on HD.  TTS. Appreciate nephrology assistance. Continue HD per nephrology.  Patient received [name] in the ER.  3.  HTN. Blood pressure stable. Monitor.  4.  Anemia of chronic kidney disease Hemoglobin stable.  Monitor.  5.  Chronic hep C,  Monitor.  6.  Constipation. X-ray abdomen. Continue bowel regimen.  7.  Active smoker. Counseled to quit. Monitor.  8.  Goals of care conversation. Intermittently missing HD. Family wanted to continue HD on admission. Explained patient's overall decline with severe protein calorie malnutrition and skin infections. Continue to engage with the family regarding goals of care conversation.  9.  Severe protein calorie malnutrition. Underweight. Dietary consulted.  Continue supplement.  Body mass index is 16.14 kg/m.  Nutrition Problem: Severe Malnutrition Etiology: chronic illness (ESRD on HD) Interventions: Interventions: MVI,Magic cup,Nepro shake   10.  Stage II sacral medial pressure ulcer, POA. Continue foam dressing.  Pain  control. X-ray pelvis.  Diet: Renal diet DVT Prophylaxis:   heparin injection 5,000 Units Start: 06/11/20 2200    Advance goals of care discussion: Full code  Family Communication: no family was present at bedside, at the time of interview.  Discussed with daughter on the phone. The pt provided permission to discuss medical plan with the family. Opportunity was given to ask question and all questions were answered satisfactorily.   Disposition:  Status is: Inpatient  Remains inpatient appropriate because:IV treatments appropriate due to intensity of illness or inability to take PO   Dispo: The patient is from: Home              Anticipated d/c is to: SNF              Patient currently is not medically stable to d/c.   Difficult to place patient         Subjective: No nausea no vomiting.  Reports cough.  Reports leg pain.  Reports pelvic pain.  Also continues to have constipation.  Minimal p.o. intake.  Physical Exam:  General: Appear in mild distress, no Rash; Oral Mucosa Clear, moist. no Abnormal Neck Mass Or lumps, Conjunctiva normal  Cardiovascular: S1 and S2 Present, no Murmur, Respiratory: good respiratory effort, Bilateral Air entry present and CTA, no Crackles, no wheezes Abdomen: Bowel Sound present, Soft and no tenderness Extremities: Bilateral pedal edema Neurology: alert and oriented to time, place, and person affect appropriate. no new focal deficit Gait not checked due to patient safety concerns    Vitals:   06/12/20 1015 06/12/20 1047 06/12/20 1347 06/12/20 1712  BP: 104/60 (!) 123/109  (!) 101/51  Pulse: 97 (!) 102  96  Resp: '17 16  16  '$ Temp: 97.7 F (36.5 C) 98 F (36.7 C)    TempSrc: Oral     SpO2: 96% 92% 100% 96%  Weight: 44 kg     Height:        Intake/Output Summary (Last 24 hours) at 06/12/2020 1908 Last data filed at 06/12/2020 1300 Gross per 24 hour  Intake 966.25 ml  Output 1000 ml  Net -33.75 ml   Filed Weights   06/11/20 1430  06/12/20 0727 06/12/20 1015  Weight: 45.4 kg 45 kg 44 kg    Data Reviewed: I have personally reviewed and interpreted daily labs, tele strips, imaging. I reviewed all nursing notes, pharmacy notes, vitals, pertinent old records I have discussed plan of care as described above with RN and patient/family.  CBC: Recent Labs  Lab 06/11/20 1447 06/12/20 0315  WBC 7.9 11.1*  NEUTROABS 5.1  --   HGB 9.5* 8.4*  HCT 30.1* 26.2*  MCV 86.0 83.2  PLT 394 [id number]   Basic Metabolic Panel: Recent Labs  Lab 06/11/20 1447 06/12/20 0315  NA 137 135  K 6.0* 5.6*  CL 93* 96*  CO2 25 19*  GLUCOSE 83 90  BUN 56* 64*  CREATININE 8.56* 8.89*  CALCIUM 8.6* 8.2*    Studies: No results found.  Scheduled Meds: . albuterol  2.5 mg Nebulization Q6H  . Chlorhexidine Gluconate Cloth  6 each Topical Q0600  . feeding supplement (NEPRO CARB STEADY)  237 mL Oral BID BM  . heparin  5,000 Units Subcutaneous Q8H  . multivitamin  1 tablet Oral QHS  . neomycin-bacitracin-polymyxin  1 application Topical BID  . senna-docusate  1 tablet Oral BID  . [START ON 06/15/2020] vancomycin  500 mg Intravenous Q T,Th,Sa-HD   Continuous Infusions: PRN Meds: acetaminophen **OR** acetaminophen, bisacodyl, guaiFENesin, guaiFENesin, hydrALAZINE, melatonin, ondansetron **OR** ondansetron (ZOFRAN) IV, oxyCODONE-acetaminophen, polyethylene glycol  Time spent: 35 minutes  Author: Berle Mull, MD [facility] 06/12/2020 7:08 PM  To reach On-call, see care teams to locate the attending and reach out via [email or web address]. Between 7PM-7AM, please contact night-coverage If you still have difficulty reaching the attending provider, please page the [facility] (Director on Call) for [facility] on amion for assistance.

## 2020-06-12 NOTE — Progress Notes (Signed)
Initial Nutrition Assessment  DOCUMENTATION CODES:  Severe malnutrition in context of chronic illness  INTERVENTION:  Feeding assistance at all meal times.  Add Nepro Shake po BID, each supplement provides 425 kcal and 19 grams protein.  Add Magic cup TID with meals, each supplement provides 290 kcal and 9 grams of protein.  Add Rena-Vite daily.  NUTRITION DIAGNOSIS:  Severe Malnutrition related to chronic illness (ESRD on HD) as evidenced by severe muscle depletion,severe fat depletion.  GOAL:  Patient will meet greater than or equal to 90% of their needs  MONITOR:  PO intake,Supplement acceptance,Labs,Weight trends  REASON FOR ASSESSMENT:  Malnutrition Screening Tool    ASSESSMENT:  70 yo female with PMH of ESRD on HD, chronic anemia, HTN, chronic pain, current smoker, and hep C presents with R foot cellulitis/wound and chronic bilateral lower extremity edema.  Spoke with pt at bedside. Pt in a lot of pain. She reports not eating well at home some of the time. She reports barely eating to nothing at all on dialysis days because her appetite is poor. On non-dialysis days, she'd eat some meals like chicken, greens, and rice.  She reports a large amount of weight loss, but unsure of how much and over how long. Epic endorses a 22 lb (19%) weight loss in the past 11 months, which is significant. While some of this is likely fluid, pt is very thin. Pt's EDW is 41.5 kg.  Of note, pt unable to feed self given arthritis, so she will need assistance during meals to eat.  Encouraged her to eat what she can. Pt is open to supplements, wanting Nepro and Magic Cup. Recommend Nepro BID and MC TID to aid in calorie and protein intake. Also add a rena-vite.  Relevant Medications: heparin 5000 units TID, Senakot BID, oxycodone Labs: reviewed; K 5.6  NUTRITION - FOCUSED PHYSICAL EXAM: Flowsheet Row Most Recent Value  Orbital Region Severe depletion  Upper Arm Region Severe depletion   Thoracic and Lumbar Region Severe depletion  Buccal Region Severe depletion  Temple Region Severe depletion  Clavicle Bone Region Severe depletion  Clavicle and Acromion Bone Region Severe depletion  Scapular Bone Region Severe depletion  Dorsal Hand Severe depletion  Patellar Region Severe depletion  Anterior Thigh Region Severe depletion  Posterior Calf Region Severe depletion  Edema (RD Assessment) Moderate  Hair Reviewed  Eyes Reviewed  Mouth Reviewed  Skin Reviewed  Nails Reviewed     Diet Order:   Diet Order            Diet renal with fluid restriction Fluid restriction: 1200 mL Fluid; Room service appropriate? Yes; Fluid consistency: Thin  Diet effective now                EDUCATION NEEDS:  Education needs have been addressed  Skin:  Skin Assessment: Skin Integrity Issues: Skin Integrity Issues:: Stage II Stage II: Sacrum  Last BM:  unknown  Height:  Ht Readings from Last 1 Encounters:  06/11/20 '5\' 5"'$  (1.651 m)   Weight:  Wt Readings from Last 1 Encounters:  06/12/20 44 kg   Ideal Body Weight:  56.8 kg  BMI:  Body mass index is 16.14 kg/m.  Estimated Nutritional Needs:  Kcal:  [id number] Protein:  90-105 grams Fluid:  1200 ml fluid restriction  Derrel Nip, RD, LDN Registered Dietitian After Hours/Weekend Pager # in [place]

## 2020-06-12 NOTE — Consult Note (Addendum)
[facility] Renal Consultation Note  Indication for Consultation:  Management of ESRD/hemodialysis; anemia, hypertension/volume and secondary hyperparathyroidism  HPI: [name] is a 70 y.o. female with   ESRD 2/2 HTN first HD 06/1989,hx of DM - not on meds hx etoh with sz due to W/D/tobacco abuse  subarachnoid hemorrhage 1986, severe L4-5 and L5-S1 lumbar spinal stenosis with neurogenic claudication  hep C + 2011  R nephrectomy 2010 03/2011 adm x 2 LUE DVT, ischemic colitis with R hemicolectomy ileostomy (reversed 05/2011), E coli, foot drop,sinus tach /mod Ao stenosis -.  Now admitted with R Ft cellulitis /wound , hyperkalemia K 6.0 (missed Sat HD 3/26).  Noted history from ER daughter reported spider bite over foot overnight area got hot sloughy and swollen, in ER afebrile, BP 156/75, potassium 6.0 Hgb 9.5 lower extremity DVT scan negative met with IV vancomycin IV Rocephin, x-ray right foot showed no osteomyelitis .  Currently seen in hemodialysis patient stating she feels sore all over 2/2  bad arthritis, and also has sore on her behind, losing weight because of decreased appetite denies chills fever shortness of breath chest pain ,cough.     Past Medical History:  Diagnosis Date  . Anemia    anemia of chronic disease likely 2/2 ESRD per last anemia panel (02/2011) with Fe 35, TIBC 167, ferritin 2041  // BL Hgb 8-10  . Anuria    Due to dialysis  . Aortic stenosis, mild    03/2011 echo  . Arthritis    Back  . Blood transfusion 1990's   r/t Kidney removal surgery  . Brain aneurysm    No records could be found  . Colitis, ischemic ([place]) 01/2011   S/P partial colectomy of right hemicolon and ileostomy placement  . Diabetes mellitus    Borderline  . Dialysis patient [facility])    [place] Tues,Thursday, saturday  . Dysrhythmia    SVT for brief period in Feb 2013  . End stage renal disease on dialysis [facility])    Secondary to hypertension // T/Th/Sat  dialysis on [employer]  . Foot drop, bilateral    after back surgery  . Heart murmur    Born with heart mumur, does not require follow up per pt  . Hepatitis C   . Hypertension    Does not see a heart doctor, had pre transplant stress test at [facility]    . Lumbar spinal stenosis     bilateral L4-5 and L5-S1 posterior lumbar interbody arthrodesis and bilateral L4-S1 posterior lateral arthrodesis for lumbar stenosis dynamic lumbar spondylolisthesis lumbar spondylosis lumbar radiculopathy with foot drop // s/p surgery 01/2011 - see surgery section for details.  . Seizures ([place])    r/t HTN in 1990's x 1  . Umbilical hernia age 15    Past Surgical History:  Procedure Laterality Date  . A/V FISTULAGRAM Right 02/05/2019   Procedure: A/V FISTULAGRAM;  Surgeon: Katha Cabal, MD;  Location: [place] [facility] LAB;  Service: Cardiovascular;  Laterality: Right;  . APPENDECTOMY  1960's  . AV FISTULA PLACEMENT  04/08/2011   Procedure: INSERTION OF ARTERIOVENOUS (AV) GORE-TEX GRAFT ARM;  Surgeon: Rosetta Posner, MD;  Location: [facility] OR;  Service: Vascular;  Laterality: Right;  Revision of Upper Arm Gore-Tex Graft  . BACK SURGERY  01/30/2011   L4-S1 lumbar laminectomy facetectomy and foraminotomies for decompression a bilateral L4-5 and L5-S1 posterior lumbar interbody arthrodesis and bilateral L4-S1 posterior lateral arthrodesis for lumbar stenosis dynamic lumbar spondylolisthesis lumbar  spondylosis lumbar radiculopathy with foot drop  . COLONOSCOPY  02/19/2011   Procedure: COLONOSCOPY;  Surgeon: Missy Sabins, MD;  Location: [place];  Service: Endoscopy;  Laterality: N/A;  . COLONOSCOPY  03/07/2011   Procedure: COLONOSCOPY;  Surgeon: Cleotis Nipper, MD;  Location: [facility] ENDOSCOPY;  Service: Endoscopy;  Laterality: N/A;  no prep needed--pt has long Hartmann's pouch and ileostomy  . COLOSTOMY  02/09/2011   Reason for surgery: ischemic colitis of right hemicolon requiring partial colectomy and  ileostomy.  Surgeon: Harl Bowie, MD;  Location: [place];  Service: General;  Laterality: Right;  . ILEOSTOMY CLOSURE  05/29/2011   Procedure: ILEOSTOMY TAKEDOWN;  Surgeon: Harl Bowie, MD;  Location: [place];  Service: General;  Laterality: N/A;  . INSERTION OF DIALYSIS CATHETER Right 04/15/2016   Procedure: INSERTION OF DIALYSIS CATHETER RIGHT INTERNAL JUGULAR;  Surgeon: Conrad Altona, MD;  Location: [place];  Service: Vascular;  Laterality: Right;  . LAPAROTOMY  02/09/2011   Procedure: EXPLORATORY LAPAROTOMY;  Surgeon: Harl Bowie, MD;  Location: [place];  Service: General;  Laterality: N/A;  . NEPHRECTOMY  2010   right side done at [facility] on transplant list  . PARTIAL COLECTOMY  02/09/2011   Reason for surgery: ischemic colitis of right hemicolon; Surgeon: Harl Bowie, MD;  Location: [place];  Service: General;  Laterality: Right;  . REVISION OF ARTERIOVENOUS GORETEX GRAFT Right 04/15/2016   Procedure: REVISION OF ARTERIOVENOUS GORETEX GRAFT USING 34mX20cm GORETEX GRAFT;  Surgeon: BConrad  MD;  [place]  Service: Vascular;  Laterality: Right;  . RIB RESECTION     d/t kidney removal  . TONSILLECTOMY     as a child  . total parathyroidectomy  01/17/2001   with autotransplantation into left forearm // due to secondary hyperparathyroidism from ESRD  . UMBILICAL HERNIA REPAIR  age 70- 78 . UPPER EXTREMITY ANGIOGRAPHY Right 07/21/2017   Procedure: UPPER EXTREMITY ANGIOGRAPHY;  Surgeon: CWaynetta Sandy MD;  [place];  Service: Cardiovascular;  Laterality: Right;  .Mar[facility] KitchenVASCULAR SURGERY     right arm dialysis graft      Family History  Problem Relation Age of Onset  . Diabetes Father   . Stroke Mother   . COPD Brother   . Heart attack Brother   . Bronchitis Sister   . Anesthesia problems Neg Hx   . Hypotension Neg Hx   . Malignant hyperthermia Neg Hx   . Pseudochol deficiency Neg Hx       reports that she has been smoking  cigarettes. She has a 24.00 pack-year smoking history. She has never used smokeless tobacco. She reports current alcohol use of about 3.0 - 4.0 standard drinks of alcohol per week. She reports that she does not use drugs.   Allergies  Allergen Reactions  . Iodinated Diagnostic Agents Other (See Comments)    Seziure   . Hydrocodone Nausea And Vomiting  . Metoprolol Nausea And Vomiting  . Iohexol Other (See Comments)    Reaction is convulsions    Prior to Admission medications   Medication Sig Start Date End Date Taking? Authorizing Provider  acetaminophen (TYLENOL) 325 MG tablet Take 2 tablets (650 mg total) by mouth every 6 (six) hours as needed for mild pain (or Fever >/= 101). 05/28/20  Yes Elgergawy, DSilver Huguenin MD  amoxicillin (AMOXIL) 500 MG capsule Take 500 mg by mouth every 8 (eight) hours as needed. Tooth pain 05/12/20  Yes [provider]  guaiFENesin (MUCINEX) 600 MG 12 hr tablet Take 600 mg by mouth 2 (two) times daily as needed for cough.   Yes [provider]  guaiFENesin (ROBITUSSIN) 100 MG/5ML liquid Take 200 mg by mouth 3 (three) times daily as needed for cough.   Yes [provider]  neomycin-bacitracin-polymyxin (NEOSPORIN) ointment Apply 1 application topically 2 (two) times daily. Butt sores   Yes [provider]  oxyCODONE-acetaminophen (PERCOCET) 10-325 MG tablet Take 0.5 tablets by mouth 2 (two) times daily as needed. Patient taking differently: Take 1 tablet by mouth 4 (four) times daily as needed for pain. 03/04/19  Yes Aline August, MD     Anti-infectives (From admission, onward)   Start     Dose/Rate Route Frequency Ordered Stop   06/12/20 2000  vancomycin (VANCOREADY) IVPB 500 mg/100 mL  Status:  Discontinued        500 mg 100 mL/hr over 60 Minutes Intravenous Every 24 hours 06/11/20 1749 06/12/20 0933   06/12/20 1600  cefTRIAXone (ROCEPHIN) 2 g in sodium chloride 0.9 % 100 mL IVPB  Status:  Discontinued        2 g 200 mL/hr  over 30 Minutes Intravenous Every 24 hours 06/11/20 1821 06/12/20 0802   06/12/20 0934  vancomycin variable dose per unstable renal function (pharmacist dosing)         Does not apply See admin instructions 06/12/20 0934     06/12/20 0000  vancomycin (VANCOREADY) IVPB 1000 mg/200 mL        1,000 mg 200 mL/hr over 60 Minutes Intravenous  Once 06/11/20 2017 06/12/20 0130   06/11/20 2115  cefTRIAXone (ROCEPHIN) 1 g in sodium chloride 0.9 % 100 mL IVPB  Status:  Discontinued        1 g 200 mL/hr over 30 Minutes Intravenous Every 24 hours 06/11/20 2017 06/12/20 0802   06/11/20 1600  vancomycin (VANCOCIN) IVPB 1000 mg/200 mL premix        1,000 mg 200 mL/hr over 60 Minutes Intravenous  Once 06/11/20 1558 06/11/20 1835   06/11/20 1600  cefTRIAXone (ROCEPHIN) 2 g in sodium chloride 0.9 % 100 mL IVPB        2 g 200 mL/hr over 30 Minutes Intravenous  Once 06/11/20 1558 06/11/20 1734      Results for orders placed or performed during the hospital encounter of 06/11/20 (from the past 48 hour(s))  Lactic acid, plasma     Status: None   Collection Time: 06/11/20  2:47 PM  Result Value Ref Range   Lactic Acid, Venous 1.1 0.5 - 1.9 mmol/L    Comment: Performed at [facility], [place] [address] [place], [place] [address]  Comprehensive metabolic panel     Status: Abnormal   Collection Time: 06/11/20  2:47 PM  Result Value Ref Range   Sodium 137 135 - 145 mmol/L   Potassium 6.0 (H) 3.5 - 5.1 mmol/L    Comment: SLIGHT HEMOLYSIS   Chloride 93 (L) 98 - 111 mmol/L   CO2 25 22 - 32 mmol/L   Glucose, Bld 83 70 - 99 mg/dL    Comment: Glucose reference range applies only to samples taken after fasting for at least 8 hours.   BUN 56 (H) 8 - 23 mg/dL   Creatinine, Ser 8.56 (H) 0.44 - 1.00 mg/dL   Calcium 8.6 (L) 8.9 - 10.3 mg/dL   Total Protein 8.2 (H) 6.5 - 8.1 g/dL   Albumin 3.4 (L) 3.5 -  5.0 g/dL   AST 29 15 - 41 U/L   ALT 8 0 - 44 U/L   Alkaline Phosphatase 52 38 - 126 U/L   Total  Bilirubin 1.8 (H) 0.3 - 1.2 mg/dL   GFR, Estimated 5 (L) >60 mL/min    Comment: (NOTE) Calculated using the CKD-EPI Creatinine Equation (2021)    Anion gap 19 (H) 5 - 15    Comment: Performed at [facility], [place] [address] [place], [place] [address]  CBC WITH DIFFERENTIAL     Status: Abnormal   Collection Time: 06/11/20  2:47 PM  Result Value Ref Range   WBC 7.9 4.0 - 10.5 K/uL   RBC 3.50 (L) 3.87 - 5.11 MIL/uL   Hemoglobin 9.5 (L) 12.0 - 15.0 g/dL   HCT 30.1 (L) 36.0 - 46.0 %   MCV 86.0 80.0 - 100.0 fL   MCH 27.1 26.0 - 34.0 pg   MCHC 31.6 30.0 - 36.0 g/dL   RDW 17.3 (H) 11.5 - 15.5 %   Platelets 394 150 - 400 K/uL   nRBC 0.0 0.0 - 0.2 %   Neutrophils Relative % 65 %   Neutro Abs 5.1 1.7 - 7.7 K/uL   Lymphocytes Relative 18 %   Lymphs Abs 1.5 0.7 - 4.0 K/uL   Monocytes Relative 11 %   Monocytes Absolute 0.9 0.1 - 1.0 K/uL   Eosinophils Relative 4 %   Eosinophils Absolute 0.3 0.0 - 0.5 K/uL   Basophils Relative 1 %   Basophils Absolute 0.1 0.0 - 0.1 K/uL   Immature Granulocytes 1 %   Abs Immature Granulocytes 0.04 0.00 - 0.07 K/uL    Comment: Performed at [facility], [place] [address] [place], [place] [address]  Protime-INR     Status: None   Collection Time: 06/11/20  2:47 PM  Result Value Ref Range   Prothrombin Time 13.1 11.4 - 15.2 seconds   INR 1.0 0.8 - 1.2    Comment: (NOTE) INR goal varies based on device and disease states. Performed at [facility], [place] [address] [place], [place] [address]   APTT     Status: None   Collection Time: 06/11/20  2:47 PM  Result Value Ref Range   aPTT 27 24 - 36 seconds    Comment: Performed at [facility], [place] [address], [place], [place] [address]  Blood Culture (routine x 2)     Status: None (Preliminary result)   Collection Time: 06/11/20  2:47 PM   Specimen: BLOOD LEFT FOREARM  Result Value Ref Range   Specimen Description      BLOOD LEFT  FOREARM Performed at [place] [address], [place], [place] [address]    Special Requests      BOTTLES DRAWN AEROBIC AND ANAEROBIC Blood Culture adequate volume Performed at [place] [address], [place], [place] [address]    Culture      NO GROWTH < 24 HOURS Performed at [place] [address], [place], [place] [address]    Report Status PENDING   CBG monitoring, ED     Status: None   Collection Time: 06/11/20  6:36 PM  Result Value Ref Range   Glucose-Capillary 96 70 - 99 mg/dL    Comment: Glucose reference range applies only to samples taken after fasting for at least 8 hours.  Lactic acid, plasma     Status: None   Collection  Time: 06/11/20  8:45 PM  Result Value Ref Range   Lactic Acid, Venous 1.6 0.5 - 1.9 mmol/L    Comment: Performed at [place] [address] [place], [place] [address]  Blood Culture (routine x 2)     Status: None (Preliminary result)   Collection Time: 06/11/20  8:45 PM   Specimen: BLOOD LEFT HAND  Result Value Ref Range   Specimen Description BLOOD LEFT HAND    Special Requests      BOTTLES DRAWN AEROBIC ONLY Blood Culture results may not be optimal due to an inadequate volume of blood received in culture bottles   Culture      NO GROWTH < 12 HOURS Performed at [place] [address] [place], [place] [address]    Report Status PENDING   Basic metabolic panel     Status: Abnormal   Collection Time: 06/12/20  3:15 AM  Result Value Ref Range   Sodium 135 135 - 145 mmol/L   Potassium 5.6 (H) 3.5 - 5.1 mmol/L   Chloride 96 (L) 98 - 111 mmol/L   CO2 19 (L) 22 - 32 mmol/L   Glucose, Bld 90 70 - 99 mg/dL    Comment: Glucose reference range applies only to samples taken after fasting for at least 8 hours.   BUN 64 (H) 8 - 23 mg/dL   Creatinine, Ser 8.89 (H) 0.44 - 1.00 mg/dL   Calcium 8.2 (L) 8.9 - 10.3 mg/dL   GFR, Estimated 4 (L) >60 mL/min    Comment:  (NOTE) Calculated using the CKD-EPI Creatinine Equation (2021)    Anion gap 20 (H) 5 - 15    Comment: Performed at [place] [address] [place], [place] [address]  CBC     Status: Abnormal   Collection Time: 06/12/20  3:15 AM  Result Value Ref Range   WBC 11.1 (H) 4.0 - 10.5 K/uL   RBC 3.15 (L) 3.87 - 5.11 MIL/uL   Hemoglobin 8.4 (L) 12.0 - 15.0 g/dL   HCT 26.2 (L) 36.0 - 46.0 %   MCV 83.2 80.0 - 100.0 fL   MCH 26.7 26.0 - 34.0 pg   MCHC 32.1 30.0 - 36.0 g/dL   RDW 17.4 (H) 11.5 - 15.5 %   Platelets 339 150 - 400 K/uL   nRBC 0.0 0.0 - 0.2 %    Comment: Performed at [facility], [place] [address] [place], [place] [address]    ROS: See HPI  Physical Exam: Vitals:   06/11/20 2017 06/12/20 0508  BP: (!) 108/56 120/64  Pulse: (!) 106   Resp: 20 16  Temp: 98.4 F (36.9 C) 97.9 F (36.6 C)  SpO2: 99% 93%     General: On HD, pleasant thin elderly female chronically ill-appearing NAD HEENT: [place], EOMI, nonicteric, MMM Neck: JVD Heart: RRR, 3 /6 stock ejection murmur no rub or gallop Lungs: Slightly decreased at bases, occasional congested cough, nonlabored breathing Abdomen: Bowel sounds positive soft nontender, ND Extremities: Right foot dorsum edema, mildly erythematous skin breakdown before toes dry wound no discharge, right lower leg 1+ edema left trace edema Skin: No overt rash see extremities Neuro: Alert O x3, has chronic right foot drop has lower extremity weakness Dialysis Access: Patent on HD right upper arm AV GG  Dialysis Orders: Center: [facility] on TTS. EDW 41.5 kg HD Bath 2K, 2.5 CA time  Heparin none, RUA  AVGG     Mircera  60 mcg every 2 weeks I last given 05/29/2020  Assessment/Plan 1. Right foot cellulitis/wound= ABX per admit team Rocephin/vancomycin, plan work-up per admit 2. Hyperkalemia= secondary to missed hemodialysis HD today follow-up labs 3. ESRD -HD TTS, HD today off schedule secondary to missed treatment, follow-up lab in  a.m. 4. Hypertension/volume  -mild hypotension in ER UF as tolerated in HD, no significant volume overload 5. Anemia of ESRD-Hgb 8.4, Aranesp dose today 60 follow-up Hgb trend 6. Metabolic bone disease -no vitamin D phosphorus 3.1, no phosphate binder needed with decreased p.o. take 7. Chronic arthritic pain on Percocet every 6 hours 8. COPD chronic tobacco use advised patient to quit 9. History of COVID= not in isolation 10. Chronic hepatitis C 11. Nutrition -Albumin 3.4, renal diet , renal vitamin givenephro for protein supplement 12. Goals of care= as per admit patient and family definitely want to continue dialysis for now waiting outpatient palliative care evaluation,  "but would not consider hospice" as an outpatient unit Dr. Hollie Salk was addressing this also  Ernest Haber, PA-C [place] [phone] 06/12/2020, 8:19 AM

## 2020-06-12 NOTE — Progress Notes (Signed)
Patient's daughter Cindra Eves [phone] has requested to speak with you MD her mother's plan of care, MD has been notified and RN will continue to monitor this patient.

## 2020-06-12 NOTE — Procedures (Deleted)
I was present at this dialysis session. I have reviewed the session itself and made appropriate changes.   Filed Weights   06/11/20 1430 06/12/20 0727 06/12/20 1015  Weight: 45.4 kg 45 kg 44 kg    Recent Labs  Lab 06/12/20 0315  NA 135  K 5.6*  CL 96*  CO2 19*  GLUCOSE 90  BUN 64*  CREATININE 8.89*  CALCIUM 8.2*    Recent Labs  Lab 06/11/20 1447 06/12/20 0315  WBC 7.9 11.1*  NEUTROABS 5.1  --   HGB 9.5* 8.4*  HCT 30.1* 26.2*  MCV 86.0 83.2  PLT 394 339    Scheduled Meds: . albuterol  2.5 mg Nebulization Q6H  . Chlorhexidine Gluconate Cloth  6 each Topical Q0600  . heparin  5,000 Units Subcutaneous Q8H  . neomycin-bacitracin-polymyxin  1 application Topical BID  . senna  1 tablet Oral BID  . vancomycin variable dose per unstable renal function (pharmacist dosing)   Does not apply See admin instructions   Continuous Infusions: PRN Meds:.acetaminophen **OR** acetaminophen, bisacodyl, guaiFENesin, guaiFENesin, hydrALAZINE, melatonin, ondansetron **OR** ondansetron (ZOFRAN) IV, oxyCODONE-acetaminophen, polyethylene glycol   Santiago Bumpers,  MD 06/12/2020, 11:23 AM

## 2020-06-13 ENCOUNTER — Inpatient Hospital Stay (HOSPITAL_COMMUNITY): Payer: Medicare PPO

## 2020-06-13 DIAGNOSIS — E43 Unspecified severe protein-calorie malnutrition: Secondary | ICD-10-CM

## 2020-06-13 DIAGNOSIS — L02611 Cutaneous abscess of right foot: Secondary | ICD-10-CM

## 2020-06-13 LAB — CBC
HCT: 24.5 % — ABNORMAL LOW (ref 36.0–46.0)
Hemoglobin: 8.1 g/dL — ABNORMAL LOW (ref 12.0–15.0)
MCH: 27.6 pg (ref 26.0–34.0)
MCHC: 33.1 g/dL (ref 30.0–36.0)
MCV: 83.6 fL (ref 80.0–100.0)
Platelets: 377 10*3/uL (ref 150–400)
RBC: 2.93 MIL/uL — ABNORMAL LOW (ref 3.87–5.11)
RDW: 17.1 % — ABNORMAL HIGH (ref 11.5–15.5)
WBC: 7.1 10*3/uL (ref 4.0–10.5)
nRBC: 0 % (ref 0.0–0.2)

## 2020-06-13 LAB — BASIC METABOLIC PANEL
Anion gap: 14 (ref 5–15)
BUN: 32 mg/dL — ABNORMAL HIGH (ref 8–23)
CO2: 28 mmol/L (ref 22–32)
Calcium: 8.6 mg/dL — ABNORMAL LOW (ref 8.9–10.3)
Chloride: 97 mmol/L — ABNORMAL LOW (ref 98–111)
Creatinine, Ser: 5.52 mg/dL — ABNORMAL HIGH (ref 0.44–1.00)
GFR, Estimated: 8 mL/min — ABNORMAL LOW (ref >60)
Glucose, Bld: 94 mg/dL (ref 70–99)
Potassium: 3.7 mmol/L (ref 3.5–5.1)
Sodium: 139 mmol/L (ref 135–145)

## 2020-06-13 MED ORDER — DARBEPOETIN ALFA 60 MCG/0.3ML IJ SOSY
PREFILLED_SYRINGE | INTRAMUSCULAR | Status: AC
  Filled 2020-06-13: qty 0.3

## 2020-06-13 MED ORDER — NEPRO/CARBSTEADY PO LIQD
237.0000 mL | Freq: Three times a day (TID) | ORAL | Status: DC
  Administered 2020-06-13 – 2020-06-17 (×8): 237 mL via ORAL

## 2020-06-13 MED ORDER — OXYCODONE-ACETAMINOPHEN 5-325 MG PO TABS
ORAL_TABLET | ORAL | Status: AC
  Filled 2020-06-13: qty 1

## 2020-06-13 MED ORDER — SODIUM CHLORIDE 0.9 % IV SOLN
1.0000 g | INTRAVENOUS | Status: DC
  Administered 2020-06-15: 1 g via INTRAVENOUS
  Filled 2020-06-13 (×5): qty 10

## 2020-06-13 MED ORDER — DARBEPOETIN ALFA 60 MCG/0.3ML IJ SOSY
60.0000 ug | PREFILLED_SYRINGE | INTRAMUSCULAR | Status: DC
  Administered 2020-06-13: 60 ug via INTRAVENOUS

## 2020-06-13 MED ORDER — ALBUTEROL SULFATE (2.5 MG/3ML) 0.083% IN NEBU: 2.5000 mg | INHALATION_SOLUTION | RESPIRATORY_TRACT | Status: DC | PRN

## 2020-06-13 MED ORDER — TRAZODONE HCL 50 MG PO TABS
50.0000 mg | ORAL_TABLET | Freq: Every evening | ORAL | Status: DC | PRN
  Administered 2020-06-14 – 2020-06-16 (×3): 50 mg via ORAL
  Filled 2020-06-13 (×3): qty 1

## 2020-06-13 MED ORDER — MELATONIN 3 MG PO TABS
6.0000 mg | ORAL_TABLET | Freq: Every evening | ORAL | Status: DC | PRN
  Administered 2020-06-13 – 2020-06-16 (×4): 6 mg via ORAL
  Filled 2020-06-13 (×4): qty 2

## 2020-06-13 NOTE — Consult Note (Signed)
PODIATRY CONSULTATION  NAME [name] MRN [id number] DOB Aug 29, 1950 DOA 06/11/2020   Reason for consult: Abscess right foot Chief Complaint  Patient presents with  . Wound Check    Consulting physician: Dr. Dwyane Dee MD  History of present illness: 70 y.o. female presenting to ER with generalized body aches, right foot pain and chronic ongoing neck and lower back pain.  Upon admission MRI was ordered right foot findings consistent with multiloculated complex abscess dorsal aspect of the right foot.  Podiatry was consulted.  Past Medical History:  Diagnosis Date  . Anemia    anemia of chronic disease likely 2/2 ESRD per last anemia panel (02/2011) with Fe 35, TIBC 167, ferritin 2041  // BL Hgb 8-10  . Anuria    Due to dialysis  . Aortic stenosis, mild    03/2011 echo  . Arthritis    Back  . Blood transfusion 1990's   r/t Kidney removal surgery  . Brain aneurysm    No records could be found  . Colitis, ischemic ([place]) 01/2011   S/P partial colectomy of right hemicolon and ileostomy placement  . Diabetes mellitus    Borderline  . Dialysis patient [facility])    [place] Tues,Thursday, saturday  . Dysrhythmia    SVT for brief period in Feb 2013  . End stage renal disease on dialysis [facility])    Secondary to hypertension // T/Th/Sat dialysis on [employer]  . Foot drop, bilateral    after back surgery  . Heart murmur    Born with heart mumur, does not require follow up per pt  . Hepatitis C   . Hypertension    Does not see a heart doctor, had pre transplant [facility]    . Lumbar spinal stenosis     bilateral L4-5 and L5-S1 posterior lumbar interbody arthrodesis and bilateral L4-S1 posterior lateral arthrodesis for lumbar stenosis dynamic lumbar spondylolisthesis lumbar spondylosis lumbar radiculopathy with foot drop // s/p surgery 01/2011 - see surgery section for details.  . Seizures ([place])    r/t HTN in 1990's x 1  . Umbilical hernia age 64     CBC Latest Ref Rng & Units 06/13/2020 06/12/2020 06/11/2020  WBC 4.0 - 10.5 K/uL 7.1 11.1(H) 7.9  Hemoglobin 12.0 - 15.0 g/dL 8.1(L) 8.4(L) 9.5(L)  Hematocrit 36.0 - 46.0 % 24.5(L) 26.2(L) 30.1(L)  Platelets 150 - 400 K/uL 377 339 394    BMP Latest Ref Rng & Units 06/13/2020 06/12/2020 06/11/2020  Glucose 70 - 99 mg/dL 94 90 83  BUN 8 - 23 mg/dL 32(H) 64(H) 56(H)  Creatinine 0.44 - 1.00 mg/dL 5.52(H) 8.89(H) 8.56(H)  Sodium 135 - 145 mmol/L 139 135 137  Potassium 3.5 - 5.1 mmol/L 3.7 5.6(H) 6.0(H)  Chloride 98 - 111 mmol/L 97(L) 96(L) 93(L)  CO2 22 - 32 mmol/L 28 19(L) 25  Calcium 8.9 - 10.3 mg/dL 8.6(L) 8.2(L) 8.6(L)      Physical Exam: General: The patient is alert and oriented x3 in no acute distress.   Dermatology: No identifiable area of drainage noted to the dorsal aspect of the right foot.  There appears to be no open wound or draining sinus at the moment.  Vascular: No significant erythema or edema noted.  There is some mild fluctuance to the dorsal aspect of the foot  Neurological: Epicritic and protective threshold diminished bilaterally.   Musculoskeletal Exam: Patient nonambulatory  MR RT foot Wo/contrast 06/13/2020 Soft tissue Complex multiloculated fluid collection along the  dorsal aspect of the foot measuring 4.8 x 4.7 x 1.6 cm at the level of the metatarsals. Surrounding soft tissue edema concerning for cellulitis of the dorsal aspect of the foot  IMPRESSION: 1. Complex multiloculated fluid collection along the dorsal aspect of the foot at the level of the metatarsals measuring 4.8 x 4.7 x 1.6 cm concerning for an abscess with cellulitis of the dorsal aspect of the foot. 2. No osteomyelitis of the right forefoot.  ASSESSMENT/PLAN OF CARE Right foot cellulitis with abscess -After reviewing the MRI I do believe she would benefit from incision and drainage right foot abscess.  This would be minimally invasive and benefits appear to outweigh the risks.  I  explained the procedure to the patient as well as her daughters on the phone this evening, who is her legal guardian, and they all consented for surgical incision and drainage. -All possible complications and details of the procedure were explained.  No guarantees were expressed or implied.  All patient questions and daughter's questions were answered today.  Verbal consent was obtained over the phone -Surgery will consist of incision and drainage right foot with irrigation and debridement -Surgery tentatively planned for tomorrow, 06/14/2020, after 5 PM.  N.p.o. after full breakfast -Preoperative orders placed -Podiatry will continue to follow    Thank you for the consult.  Please contact me directly with any questions or concerns.  Cell [phone]   Edrick Kins, DPM [facility]  Dr. Edrick Kins, DPM    [address] [place], [place] [address]                Office [phone]  Fax [phone]

## 2020-06-13 NOTE — H&P (View-Only) (Signed)
PODIATRY CONSULTATION  NAME [name] MRN [id number] DOB 1950/07/03 DOA 06/11/2020   Reason for consult: Abscess right foot Chief Complaint  Patient presents with  . Wound Check    Consulting physician: Dr. Dwyane Dee MD  History of present illness: 70 y.o. female presenting to ER with generalized body aches, right foot pain and chronic ongoing neck and lower back pain.  Upon admission MRI was ordered right foot findings consistent with multiloculated complex abscess dorsal aspect of the right foot.  Podiatry was consulted.  Past Medical History:  Diagnosis Date  . Anemia    anemia of chronic disease likely 2/2 ESRD per last anemia panel (02/2011) with Fe 35, TIBC 167, ferritin 2041  // BL Hgb 8-10  . Anuria    Due to dialysis  . Aortic stenosis, mild    03/2011 echo  . Arthritis    Back  . Blood transfusion 1990's   r/t Kidney removal surgery  . Brain aneurysm    No records could be found  . Colitis, ischemic ([place]) 01/2011   S/P partial colectomy of right hemicolon and ileostomy placement  . Diabetes mellitus    Borderline  . Dialysis patient [facility])    [place] Tues,Thursday, saturday  . Dysrhythmia    SVT for brief period in Feb 2013  . End stage renal disease on dialysis [facility])    Secondary to hypertension // T/Th/Sat dialysis on [employer]  . Foot drop, bilateral    after back surgery  . Heart murmur    Born with heart mumur, does not require follow up per pt  . Hepatitis C   . Hypertension    Does not see a heart doctor, had pre transplant stress test at [facility]    . Lumbar spinal stenosis     bilateral L4-5 and L5-S1 posterior lumbar interbody arthrodesis and bilateral L4-S1 posterior lateral arthrodesis for lumbar stenosis dynamic lumbar spondylolisthesis lumbar spondylosis lumbar radiculopathy with foot drop // s/p surgery 01/2011 - see surgery section for details.  . Seizures ([place])    r/t HTN in 1990's x 1  . Umbilical hernia age 23     CBC Latest Ref Rng & Units 06/13/2020 06/12/2020 06/11/2020  WBC 4.0 - 10.5 K/uL 7.1 11.1(H) 7.9  Hemoglobin 12.0 - 15.0 g/dL 8.1(L) 8.4(L) 9.5(L)  Hematocrit 36.0 - 46.0 % 24.5(L) 26.2(L) 30.1(L)  Platelets 150 - 400 K/uL 377 339 394    BMP Latest Ref Rng & Units 06/13/2020 06/12/2020 06/11/2020  Glucose 70 - 99 mg/dL 94 90 83  BUN 8 - 23 mg/dL 32(H) 64(H) 56(H)  Creatinine 0.44 - 1.00 mg/dL 5.52(H) 8.89(H) 8.56(H)  Sodium 135 - 145 mmol/L 139 135 137  Potassium 3.5 - 5.1 mmol/L 3.7 5.6(H) 6.0(H)  Chloride 98 - 111 mmol/L 97(L) 96(L) 93(L)  CO2 22 - 32 mmol/L 28 19(L) 25  Calcium 8.9 - 10.3 mg/dL 8.6(L) 8.2(L) 8.6(L)      Physical Exam: General: The patient is alert and oriented x3 in no acute distress.   Dermatology: No identifiable area of drainage noted to the dorsal aspect of the right foot.  There appears to be no open wound or draining sinus at the moment.  Vascular: No significant erythema or edema noted.  There is some mild fluctuance to the dorsal aspect of the foot  Neurological: Epicritic and protective threshold diminished bilaterally.   Musculoskeletal Exam: Patient nonambulatory  MR RT foot Wo/contrast 06/13/2020 Soft tissue Complex multiloculated fluid collection along the  dorsal aspect of the foot measuring 4.8 x 4.7 x 1.6 cm at the level of the metatarsals. Surrounding soft tissue edema concerning for cellulitis of the dorsal aspect of the foot  IMPRESSION: 1. Complex multiloculated fluid collection along the dorsal aspect of the foot at the level of the metatarsals measuring 4.8 x 4.7 x 1.6 cm concerning for an abscess with cellulitis of the dorsal aspect of the foot. 2. No osteomyelitis of the right forefoot.  ASSESSMENT/PLAN OF CARE Right foot cellulitis with abscess -After reviewing the MRI I do believe she would benefit from incision and drainage right foot abscess.  This would be minimally invasive and benefits appear to outweigh the risks.  I  explained the procedure to the patient as well as her daughters on the phone this evening, who is her legal guardian, and they all consented for surgical incision and drainage. -All possible complications and details of the procedure were explained.  No guarantees were expressed or implied.  All patient questions and daughter's questions were answered today.  Verbal consent was obtained over the phone -Surgery will consist of incision and drainage right foot with irrigation and debridement -Surgery tentatively planned for tomorrow, 06/14/2020, after 5 PM.  N.p.o. after full breakfast -Preoperative orders placed -Podiatry will continue to follow    Thank you for the consult.  Please contact me directly with any questions or concerns.  Cell [phone]   Edrick Kins, DPM [facility]  Dr. Edrick Kins, DPM    [address], [place] [address]                Office [phone]  Fax [phone]

## 2020-06-13 NOTE — Progress Notes (Signed)
Subjective: Does not remember dialysis yesterday, but feels better, except continued bilateral lower feet pain, just back from MRI of lower extremity  Objective Vital signs in last 24 hours: Vitals:   06/12/20 2115 06/13/20 0502 06/13/20 0835 06/13/20 1048  BP:  135/77  126/74  Pulse:  93  92  Resp:  15  18  Temp:  98.4 F (36.9 C)  98.3 F (36.8 C)  TempSrc:    Oral  SpO2: 97% 100% 98% (!) 86%  Weight:      Height:       Weight change: -0.36 kg  Physical Exam:  General:  pleasant thin elderly female chronically ill-appearing NAD Heart: RRR, 2 /6 stock ejection murmur no rub or gallop Lungs:  CTA, nonlabored breathing Abdomen: Bowel sounds positive soft nontender, ND Extremities: Right foot dorsum edema improved, bilateral feet dressings dry intact  Dialysis Access:   RUA AVGG positive bruit  Dialysis Orders: Center: [facility] on TTS. EDW 41.5 kg HD Bath 2K, 2.5 CA time  Heparin none, RUA  AVGG     Mircera 60 mcg every 2 weeks I last given 05/29/2020  Problem/Plan: 1. Right foot cellulitis/wound= ABX per admit team Rocephin/vancomycin, plan work-up per admit MRI results pending 2. Hyperkalemia= secondary to missed hemodialysis HD, a.m. K3.7  follow-up labs 3. ESRD -HD TTS, HD yesterday off schedule secondary to missed treatment, attempt dialysis today to get back on schedule  4. Hypertension/volume  -BP stable UF as tolerated in HD, no significant volume overload 5. Anemia of ESRD-Hgb 8.4> 8.1, Aranesp dose  60 today q. Tuesday dialysis  6. Metabolic bone disease -calcium stable, no vitamin D phosphorus 3.1,  no phosphate binder needed with decreased p.o. take 7. Chronic arthritic pain on Percocet every 6 hours 8. COPD chronic tobacco use advised patient to quit 9. History of COVID= not in isolation 10. Chronic hepatitis C 11. Nutrition -Albumin 3.4, renal diet , renal vitamin givenephro for protein supplement 12. Goals of care= as per admit patient and family definitely want  to continue dialysis for now waiting outpatient palliative care evaluation,  "but would not consider hospice" as an outpatient unit Dr. Hollie Salk was addressing this also  Ernest Haber, PA-C [facility] [phone] 06/13/2020,11:30 AM  LOS: 2 days   Labs: Basic Metabolic Panel: Recent Labs  Lab 06/11/20 1447 06/12/20 0315 06/13/20 0310  NA 137 135 139  K 6.0* 5.6* 3.7  CL 93* 96* 97*  CO2 25 19* 28  GLUCOSE 83 90 94  BUN 56* 64* 32*  CREATININE 8.56* 8.89* 5.52*  CALCIUM 8.6* 8.2* 8.6*   Liver Function Tests: Recent Labs  Lab 06/11/20 1447  AST 29  ALT 8  ALKPHOS 52  BILITOT 1.8*  PROT 8.2*  ALBUMIN 3.4*   No results for input(s): LIPASE, AMYLASE in the last 168 hours. No results for input(s): AMMONIA in the last 168 hours. CBC: Recent Labs  Lab 06/11/20 1447 06/12/20 0315 06/13/20 0310  WBC 7.9 11.1* 7.1  NEUTROABS 5.1  --   --   HGB 9.5* 8.4* 8.1*  HCT 30.1* 26.2* 24.5*  MCV 86.0 83.2 83.6  PLT 394 339 377   Cardiac Enzymes: No results for input(s): CKTOTAL, CKMB, CKMBINDEX, TROPONINI in the last 168 hours. CBG: Recent Labs  Lab 06/11/20 1836 06/12/20 1217  GLUCAP 96 70    Studies/Results: DG Pelvis Portable  Result Date: 06/12/2020 CLINICAL DATA:  Pelvic pain EXAM: PORTABLE PELVIS 1-2 VIEWS COMPARISON:  None. FINDINGS: Pelvic  ring is intact. No acute fracture or dislocation is noted. Postsurgical changes in the lower lumbar spine noted. No soft tissue abnormality is seen. IMPRESSION: No acute abnormality noted. Electronically Signed   By: Inez Catalina M.D.   On: 06/12/2020 19:29   DG CHEST PORT 1 VIEW  Result Date: 06/12/2020 CLINICAL DATA:  Chest pain EXAM: PORTABLE CHEST 1 VIEW COMPARISON:  05/25/2020 FINDINGS: Cardiac shadow is stable. Aortic calcifications are again seen. The lungs are well aerated bilaterally. Mild vascular prominence is noted likely related to fluid overload from end-stage renal disease. Additionally some  patchy atelectatic changes are noted in the right upper lobe. Early infiltrate could not be totally excluded. No bony abnormality is noted. IMPRESSION: Mild vascular prominence consistent with fluid overload. Patchy opacity in the right upper lobe which may represent acute infiltrate. Electronically Signed   By: Inez Catalina M.D.   On: 06/12/2020 19:27   DG Abd Portable 1V  Result Date: 06/12/2020 CLINICAL DATA:  Abdominal pain EXAM: PORTABLE ABDOMEN - 1 VIEW COMPARISON:  None. FINDINGS: Scattered large and small bowel gas is noted. No abnormal mass or abnormal calcifications are seen. No free air is seen. Postsurgical changes are noted in the lumbar spine as well as in the right abdomen. No acute bony abnormality is seen. IMPRESSION: No acute abnormality noted. Electronically Signed   By: Inez Catalina M.D.   On: 06/12/2020 19:28   DG Foot Complete Right  Result Date: 06/11/2020 CLINICAL DATA:  RIGHT foot wound, lower extremity swelling for 1 week, decreased sensation in RIGHT foot, history diabetes mellitus EXAM: RIGHT FOOT COMPLETE - 3+ VIEW COMPARISON:  11/03/2011 FINDINGS: Marked osseous demineralization. Joint spaces preserved. Scattered small vessel vascular calcification and soft tissue swelling. No acute fracture, dislocation, or bone destruction. IMPRESSION: Marked osseous demineralization and soft tissue swelling without acute bony findings. Extensive small vessel vascular calcification. Electronically Signed   By: Lavonia Dana M.D.   On: 06/11/2020 15:30   VAS [place] LOWER EXTREMITY VENOUS (DVT) (ONLY [facility] & [facility] 7a-7p)  Result Date: 06/11/2020  Lower Venous DVT Study Indications: Edema.  Comparison Study: no prior Performing Technologist: Abram Sander RVS  Examination Guidelines: A complete evaluation includes B-mode imaging, spectral Doppler, color Doppler, and power Doppler as needed of all accessible portions of each vessel. Bilateral testing is considered an integral part of a complete  examination. Limited examinations for reoccurring indications may be performed as noted. The reflux portion of the exam is performed with the patient in reverse Trendelenburg.  +---------+---------------+---------+-----------+----------+--------------+ RIGHT    CompressibilityPhasicitySpontaneityPropertiesThrombus Aging +---------+---------------+---------+-----------+----------+--------------+ CFV      Full           Yes      Yes                                 +---------+---------------+---------+-----------+----------+--------------+ SFJ      Full                                                        +---------+---------------+---------+-----------+----------+--------------+ FV Prox  Full                                                        +---------+---------------+---------+-----------+----------+--------------+  FV Mid   Full                                                        +---------+---------------+---------+-----------+----------+--------------+ FV DistalFull                                                        +---------+---------------+---------+-----------+----------+--------------+ PFV      Full                                                        +---------+---------------+---------+-----------+----------+--------------+ POP      Full           Yes      Yes                                 +---------+---------------+---------+-----------+----------+--------------+ PTV      Full                                                        +---------+---------------+---------+-----------+----------+--------------+ PERO     Full                                                        +---------+---------------+---------+-----------+----------+--------------+   +----+---------------+---------+-----------+----------+--------------+ LEFTCompressibilityPhasicitySpontaneityPropertiesThrombus Aging  +----+---------------+---------+-----------+----------+--------------+ CFV Full           Yes      Yes                                 +----+---------------+---------+-----------+----------+--------------+     Summary: RIGHT: - There is no evidence of deep vein thrombosis in the lower extremity.  - No cystic structure found in the popliteal fossa.  LEFT: - No evidence of common femoral vein obstruction.  *See table(s) above for measurements and observations.    Preliminary    Medications:  . albuterol  2.5 mg Nebulization BID  . Chlorhexidine Gluconate Cloth  6 each Topical Q0600  . feeding supplement (NEPRO CARB STEADY)  237 mL Oral BID BM  . heparin  5,000 Units Subcutaneous Q8H  . multivitamin  1 tablet Oral QHS  . neomycin-bacitracin-polymyxin  1 application Topical BID  . senna-docusate  1 tablet Oral BID  . [START ON 06/15/2020] vancomycin  500 mg Intravenous Q T,Th,Sa-HD

## 2020-06-13 NOTE — Evaluation (Signed)
Occupational Therapy Evaluation Patient Details Name: [name] MRN: [id number] DOB: 11/16/1950 Today's Date: 06/13/2020    History of Present Illness Patient is a 70 y.o. female presenting with generalized body aches, leg pain and back pain. Patient admitted with cellulitis of RLE and hyperkalemia. PMHx significant for seizures, HTN, chronic hepatitis C, ESRD on HD TTS, DM2, ischemic coloitis s/p hemicolectomy and ileostomy, brain aneursym.   Clinical Impression   PTA patient was living with her granddaughter and her 2 children. Patient states that her granddaughter works during the day but her daughters provide supervision/assist while her granddaughter is at work. Family heavily assists with ADLs/IADLs including meal prep and transportation to and from dialysis. At time of previous admission 1 year ago, patient was completing wc transfers with Mod I and was requiring little assist with ADLs. Shortly prior to this admission, patient reports that she was mostly bedbound and does not remember the last time she got to her wc. Patient reports a gradual decline in function reporting recently requiring assist for self-feeding and decreased ability to use LUE/LLE requiring assist to advance LLE toward EOB. Evaluation limited as hospital transport arrived to take patient for MRI. Will continue to assess AROM, strength, and FMC/GMC. Patient would benefit from continued acute OT services in prep for safe d/c to next level of care with recommendation for SNF rehab.     Follow Up Recommendations  SNF;Supervision/Assistance - 24 hour    Equipment Recommendations  Other (comment) (TBD)    Recommendations for Other Services       Precautions / Restrictions Precautions Precautions: Fall (foot drop) Precaution Comments: High fall risk, increased risk of skin breakdown Restrictions Weight Bearing Restrictions: No      Mobility Bed Mobility Overal bed mobility: Needs Assistance Bed Mobility:  Rolling;Sidelying to Sit Rolling: Mod assist Sidelying to sit: Max assist       General bed mobility comments: Mod A for rolling R<>L in supine with cues for hand placement. Max A for transition to and from EOB at trunk and BLE.    Transfers                 General transfer comment: Deferred. Hospital transport present to take patient for imaging.    Balance Overall balance assessment: Needs assistance Sitting-balance support: Bilateral upper extremity supported;Feet supported Sitting balance-Leahy Scale: Poor Sitting balance - Comments: Requires Mod A progressing to Min A to maintain static sitting balance at EOB. Requires increased assist with fatigue. Postural control: Posterior lean;Right lateral lean;Left lateral lean (LOB in all directions)                                 ADL either performed or assessed with clinical judgement   ADL Overall ADL's : Needs assistance/impaired Eating/Feeding: Moderate assistance Eating/Feeding Details (indicate cue type and reason): Able to bring cup to mouth to take sips. Reports frequently dropping utensils during self-feeding. Requires assist to consume meals recently. Grooming: Moderate assistance Grooming Details (indicate cue type and reason): Patient with difficulty holding onto washcloth to wash face and chest 2/2 decreased strength and decrease GMC/FMC.             Lower Body Dressing: Maximal assistance Lower Body Dressing Details (indicate cue type and reason): Max A at bed level.               General ADL Comments: Patient greatly limited by decreased strength, decreased  activity, generalized debility, and need for Mod to Max A overall for bed level ADLs.     Vision Baseline Vision/History: Wears glasses Wears Glasses: At all times (Patient states that she should wear her glasses all the time. Does not have glasses in room at time of eval.) Patient Visual Report: No change from baseline        Perception     Praxis      Pertinent Vitals/Pain Pain Assessment: 0-10 Pain Score: 10-Worst pain ever Pain Location: L foot (new onset after preventative dressing applied) Pain Descriptors / Indicators: Sharp Pain Intervention(s): Limited activity within patient's tolerance;Monitored during session;Repositioned     Hand Dominance Left   Extremity/Trunk Assessment Upper Extremity Assessment Upper Extremity Assessment: RUE deficits/detail;LUE deficits/detail RUE Deficits / Details: MMT grossly 3-/5. Can bring cup to mouth with use of RUE and take sips. Reports frequently dropping utensils during self-feeding. RUE Sensation: WNL RUE Coordination: decreased fine motor;decreased gross motor LUE Deficits / Details: Limited ROM at shoulder, elbow, wrist and digits. Unable to functionally use LUE. LUE Sensation: decreased light touch LUE Coordination: decreased fine motor;decreased gross motor   Lower Extremity Assessment Lower Extremity Assessment: Defer to PT evaluation (Patient reports R foot drop. Would benefit from bilateral PRAFO's.)   Cervical / Trunk Assessment Cervical / Trunk Assessment: Kyphotic   Communication Communication Communication: No difficulties   Cognition Arousal/Alertness: Awake/alert Behavior During Therapy: WFL for tasks assessed/performed Overall Cognitive Status: No family/caregiver present to determine baseline cognitive functioning                                 General Comments: Patient oriented to person, place, and partially to situation. Follows commands appropriately. No family present at bedside to confirm baseline cognition.   General Comments  Multiple small open sores on L thigh. Sacral wound; and bilateral heel foam bandages to prevent skin breakdown. Patient with noted swelling in BLE.    Exercises     Shoulder Instructions      Home Living Family/patient expects to be discharged to:: Private residence Living  Arrangements: Other relatives (Granddaughter and her 2 children) Available Help at Discharge: Family;Available PRN/intermittently Type of Home: House Home Access: Level entry     Home Layout: One level     Bathroom Shower/Tub: Teacher, early years/pre: Standard     Home Equipment: Other (comment);Walker - 2 wheels;Wheelchair - manual (States that she had a TTB but no longer has it)   Additional Comments: Patient reports sleeping on a couch.      Prior Functioning/Environment Level of Independence: Needs assistance  Gait / Transfers Assistance Needed: Patient reports requiring assist for all functional transfers. Has mostly been bedbound for last several weeks. Does not remember the last time she got to the wc. ADL's / Homemaking Assistance Needed: Assist for all ADLs including bathing/dressing. Reports inability to move bowels x3 weeks. Does not produce urine.            OT Problem List: Decreased strength;Decreased range of motion;Decreased activity tolerance;Impaired balance (sitting and/or standing);Decreased coordination;Decreased knowledge of use of DME or AE;Impaired UE functional use;Pain;Increased edema      OT Treatment/Interventions: Self-care/ADL training;Therapeutic exercise;Energy conservation;Neuromuscular education;Splinting;Therapeutic activities;Patient/family education;Balance training    OT Goals(Current goals can be found in the care plan section) Acute Rehab OT Goals Patient Stated Goal: To get stronger. OT Goal Formulation: With patient Time For Goal Achievement: 06/27/20 Potential to Achieve  Goals: Fair ADL Goals Pt Will Perform Eating: with set-up;with adaptive utensils;sitting Pt Will Perform Grooming: with set-up;sitting Pt Will Perform Upper Body Dressing: with min assist;sitting Pt/caregiver will Perform Home Exercise Program: Increased ROM;Increased strength;Both right and left upper extremity;With written HEP provided;With minimal  assist Additional ADL Goal #1: Patient will complete functional transfers to [facility] or recliner with Min A.  OT Frequency: Min 2X/week   Barriers to D/C:            Co-evaluation              AM-PAC OT "6 Clicks" Daily Activity     Outcome Measure Help from another person eating meals?: A Lot Help from another person taking care of personal grooming?: A Lot Help from another person toileting, which includes using toliet, bedpan, or urinal?: A Lot Help from another person bathing (including washing, rinsing, drying)?: A Lot Help from another person to put on and taking off regular upper body clothing?: A Lot Help from another person to put on and taking off regular lower body clothing?: A Lot 6 Click Score: 12   End of Session Nurse Communication: Mobility status;Patient requests pain meds  Activity Tolerance: Other (comment) (Did not progress beyond EOB as hospital transport arrived to take patient for MRI.) Patient left: in bed;Other (comment) [facility] transport present to take patient for MRI.)  OT Visit Diagnosis: Unsteadiness on feet (R26.81);Other abnormalities of gait and mobility (R26.89);Muscle weakness (generalized) (M62.81)                Time: [id number] OT Time Calculation (min): 21 min Charges:  OT General Charges $OT Visit: 1 Visit OT Evaluation $OT Eval Moderate Complexity: 1 Mod  Gabriellia Rempel H. OTR/L Supplemental OT, Department of rehab services [phone]  Griselda Tosh R H. 06/13/2020, 12:33 PM

## 2020-06-13 NOTE — Progress Notes (Signed)
PT Cancellation Note  Patient Details Name: [name] MRN: [id number] DOB: 06-29-1950   Cancelled Treatment:    Reason Eval/Treat Not Completed: Patient at procedure or test/unavailable   Currently in HD; Was transporting to HD as I arrived for PT eval;  In communication with OT re: pt status, and agree with SNF for post-acute rehab to maximize independence and safety with mobility and ADLs;   Will follow up later today as time allows;  Otherwise, will follow up for PT tomorrow;   Thank you,  Roney Marion, PT  Acute Rehabilitation Services Pager [phone] Office [phone]     [place] 06/13/2020, 12:59 PM

## 2020-06-13 NOTE — Progress Notes (Signed)
PROGRESS NOTE    [name]   [id number]  DOB: 08-28-1950  DOA: 06/11/2020     2  PCP: Janie Morning, DO  CC: foot pain   Hospital Course: [name] is a 70 yo female with PMH ESRD on HD TTS (misses sessions due to pain), HTN, DMII, arthritis, mild AS, chronic Hep C who presented to the ER with generalized body aches, right foot pain, and her chronic ongoing neck and lower back pain. Work-up was significant for hyperkalemia due to missed dialysis sessions.  She was also noted to have drainage and a foot wound on the dorsal aspect of her right foot.  Family showed a picture from home of the swelling on her foot with purulent drainage.  She was admitted for further work-up/evaluation and IV antibiotics.  Of note, she has been continuously losing weight despite "good appetite" per patient and family. There have also been discussions outpatient regarding palliative care, however no consideration for hospice has been made at this time.  Due to her ongoing decline, family is amenable for meeting with palliative care during this hospitalization for further discussions.  Interval History:  Seen in her room this afternoon after returning from dialysis and MRI.  Her daughter is bedside.  Discussed course thus far with daughter and other family on the phone.  Reviewed MRI findings which show abscess of the right foot.  They also show concern for her ongoing weight loss despite eating at home rather well.  They are amenable for talking with palliative care at this time however.  Patient also endorses constipation for several days.  She endorses some flatus and denies any nausea/vomiting.  ROS: Constitutional: positive for weight loss, negative for chills and fevers, Respiratory: negative for cough, Cardiovascular: negative for chest pain and Gastrointestinal: positive for constipation, negative for abdominal pain, nausea and vomiting  Assessment & Plan: Right foot cellulitis with  abscess -Right foot does not appear overtly erythematous nor significantly tender to palpation however MRI foot shows multiloculated fluid collection measuring 4.8 x 4.7 x 1.6 cm -Continue vancomycin and Rocephin for now -Will consult podiatry for further input.  Discussed bedside with the patient and family regarding MRI findings -Bilateral lower extremity duplex negative for DVT performed on 06/11/2020  Hyperkalemia. ESRD on HD TTS. Appreciate nephrology assistance. Continue HD per nephrology.  Patient received Lokelma in the ER.  HTN Blood pressure stable.  Anemia of chronic kidney disease Hemoglobin stable.  Monitor.  Chronic hep C,  Monitor.  Constipation. X-ray abdomen Continue bowel regimen  Active smoker. Counseled to quit. Monitor.  Goals of care conversation - family open to [facility] discussions; will consult inpatient - patient losing weight despite eating she says; also missing HD due to neck and low back pain (noted to have sacral decub ulcer)  Severe protein calorie malnutrition. - Patient's BMI is Body mass index is 14.34 kg/m.. - Patient has the following signs/symptoms consistent with PCM: (fat loss, muscle loss, muscle wasting, cachexia). - seen by RD, appreciate assistance. Continue plan per RD to include nepro shakes, magic cups  Stage II sacral medial pressure ulcer, POA. Continue foam dressing.  Pain control. - follow up [facility] consult   Old records reviewed in assessment of this patient  Antimicrobials: Rocephin 3/27 >current Vanc 3/27 >> current   DVT prophylaxis: heparin injection 5,000 Units Start: 06/11/20 2200   Code Status:   Code Status: Full Code Family Communication: daughter bedside   Disposition Plan: Status is: Inpatient  Remains inpatient  appropriate because:Ongoing diagnostic testing needed not appropriate for outpatient work up, IV treatments appropriate due to intensity of illness or inability to take PO and Inpatient  level of care appropriate due to severity of illness   Dispo: The patient is from: Home              Anticipated d/c is to: Home              Patient currently is not medically stable to d/c.   Difficult to place patient No  Risk of unplanned readmission score: Unplanned Admission- Pilot do not use: 22.96   Objective: Blood pressure 130/84, pulse (!) 108, temperature 98.6 F (37 C), temperature source Oral, resp. rate 18, height '5\' 5"'$  (1.651 m), weight 39.1 kg, SpO2 98 %.  Examination: General appearance: Frail and cachectic pleasant elderly woman resting in bed in no distress Head: Normocephalic, without obvious abnormality, atraumatic Eyes: EOMI Back: Stage II sacral pressure ulcer noted.  See pic below Lungs: clear to auscultation bilaterally Heart: regular rate and rhythm and S1, S2 normal Abdomen: normal findings: bowel sounds normal and soft, non-tender Extremities: Lower extremity edema approximately 1+.  Right foot noted with swelling on distal dorsal aspect; minimal tenderness to palpation.  No erythema or calor Skin: mobility and turgor normal Neurologic: Left hand partially contracted.  Left upper extremity flexed and partially contracted   Consultants:   Podiatry  Palliative care medicine  Procedures:     Data Reviewed: I have personally reviewed following labs and imaging studies Results for orders placed or performed during the hospital encounter of 06/11/20 (from the past 24 hour(s))  Basic metabolic panel     Status: Abnormal   Collection Time: 06/13/20  3:10 AM  Result Value Ref Range   Sodium 139 135 - 145 mmol/L   Potassium 3.7 3.5 - 5.1 mmol/L   Chloride 97 (L) 98 - 111 mmol/L   CO2 28 22 - 32 mmol/L   Glucose, Bld 94 70 - 99 mg/dL   BUN 32 (H) 8 - 23 mg/dL   Creatinine, Ser 5.52 (H) 0.44 - 1.00 mg/dL   Calcium 8.6 (L) 8.9 - 10.3 mg/dL   GFR, Estimated 8 (L) >60 mL/min   Anion gap 14 5 - 15  CBC     Status: Abnormal   Collection Time: 06/13/20   3:10 AM  Result Value Ref Range   WBC 7.1 4.0 - 10.5 K/uL   RBC 2.93 (L) 3.87 - 5.11 MIL/uL   Hemoglobin 8.1 (L) 12.0 - 15.0 g/dL   HCT 24.5 (L) 36.0 - 46.0 %   MCV 83.6 80.0 - 100.0 fL   MCH 27.6 26.0 - 34.0 pg   MCHC 33.1 30.0 - 36.0 g/dL   RDW 17.1 (H) 11.5 - 15.5 %   Platelets 377 150 - 400 K/uL   nRBC 0.0 0.0 - 0.2 %    Recent Results (from the past 240 hour(s))  Blood Culture (routine x 2)     Status: None (Preliminary result)   Collection Time: 06/11/20  2:47 PM   Specimen: BLOOD LEFT FOREARM  Result Value Ref Range Status   Specimen Description   Final    BLOOD LEFT FOREARM Performed at [facility], [address] [place], [place] [address]    Special Requests   Final    BOTTLES DRAWN AEROBIC AND ANAEROBIC Blood Culture adequate volume Performed at [place] [address] [place], [place] [address]  Culture   Final    NO GROWTH 2 DAYS Performed at [facility], [place] [address], [place], [place] [address]    Report Status PENDING  Incomplete  Blood Culture (routine x 2)     Status: None (Preliminary result)   Collection Time: 06/11/20  8:45 PM   Specimen: BLOOD LEFT HAND  Result Value Ref Range Status   Specimen Description BLOOD LEFT HAND  Final   Special Requests   Final    BOTTLES DRAWN AEROBIC ONLY Blood Culture results may not be optimal due to an inadequate volume of blood received in culture bottles   Culture   Final    NO GROWTH 2 DAYS Performed at [facility], [place] [address] [place], [place] [address]    Report Status PENDING  Incomplete     Radiology Studies: DG Pelvis Portable  Result Date: 06/12/2020 CLINICAL DATA:  Pelvic pain EXAM: PORTABLE PELVIS 1-2 VIEWS COMPARISON:  None. FINDINGS: Pelvic ring is intact. No acute fracture or dislocation is noted. Postsurgical changes in the lower lumbar spine noted. No soft tissue abnormality is seen. IMPRESSION: No acute abnormality noted.  Electronically Signed   By: Inez Catalina M.D.   On: 06/12/2020 19:29   MR FOOT RIGHT WO CONTRAST  Result Date: 06/13/2020 CLINICAL DATA:  Foot swelling, redness, drainage from the wound EXAM: MRI OF THE RIGHT FOREFOOT WITHOUT CONTRAST TECHNIQUE: Multiplanar, multisequence MR imaging of the right foot was performed. No intravenous contrast was administered. COMPARISON:  None. FINDINGS: Bones/Joint/Cartilage No marrow signal abnormality. No fracture or dislocation. Normal alignment. No joint effusion. Ligaments Collateral ligaments are intact.  Lisfranc ligament is intact. Muscles and Tendons Flexor, peroneal and extensor compartment tendons are intact. Small amount of fluid in the flexor hallucis longus tendon sheath extending beyond the field of view. Muscles are normal. Soft tissue Complex multiloculated fluid collection along the dorsal aspect of the foot measuring 4.8 x 4.7 x 1.6 cm at the level of the metatarsals. Surrounding soft tissue edema concerning for cellulitis of the dorsal aspect of the foot IMPRESSION: 1. Complex multiloculated fluid collection along the dorsal aspect of the foot at the level of the metatarsals measuring 4.8 x 4.7 x 1.6 cm concerning for an abscess with cellulitis of the dorsal aspect of the foot. 2. No osteomyelitis of the right forefoot. Electronically Signed   By: Kathreen Devoid   On: 06/13/2020 12:24   DG CHEST PORT 1 VIEW  Result Date: 06/12/2020 CLINICAL DATA:  Chest pain EXAM: PORTABLE CHEST 1 VIEW COMPARISON:  05/25/2020 FINDINGS: Cardiac shadow is stable. Aortic calcifications are again seen. The lungs are well aerated bilaterally. Mild vascular prominence is noted likely related to fluid overload from end-stage renal disease. Additionally some patchy atelectatic changes are noted in the right upper lobe. Early infiltrate could not be totally excluded. No bony abnormality is noted. IMPRESSION: Mild vascular prominence consistent with fluid overload. Patchy opacity in  the right upper lobe which may represent acute infiltrate. Electronically Signed   By: Inez Catalina M.D.   On: 06/12/2020 19:27   DG Abd Portable 1V  Result Date: 06/12/2020 CLINICAL DATA:  Abdominal pain EXAM: PORTABLE ABDOMEN - 1 VIEW COMPARISON:  None. FINDINGS: Scattered large and small bowel gas is noted. No abnormal mass or abnormal calcifications are seen. No free air is seen. Postsurgical changes are noted in the lumbar spine as well as in the right abdomen. No acute bony abnormality is seen. IMPRESSION: No acute abnormality  noted. Electronically Signed   By: Inez Catalina M.D.   On: 06/12/2020 19:28   MR FOOT RIGHT WO CONTRAST  Final Result    DG Abd Portable 1V  Final Result    DG Pelvis Portable  Final Result    DG CHEST PORT 1 VIEW  Final Result    VAS [place] LOWER EXTREMITY VENOUS (DVT) (ONLY [facility] & [facility] 7a-7p)  Final Result    DG Foot Complete Right  Final Result      Scheduled Meds: . albuterol  2.5 mg Nebulization BID  . Chlorhexidine Gluconate Cloth  6 each Topical Q0600  . [START ON 06/20/2020] darbepoetin (ARANESP) injection - DIALYSIS  60 mcg Intravenous Q Tue-HD  . feeding supplement (NEPRO CARB STEADY)  237 mL Oral TID BM  . heparin  5,000 Units Subcutaneous Q8H  . multivitamin  1 tablet Oral QHS  . neomycin-bacitracin-polymyxin  1 application Topical BID  . oxyCODONE-acetaminophen      . senna-docusate  1 tablet Oral BID  . [START ON 06/15/2020] vancomycin  500 mg Intravenous Q T,Th,Sa-HD   PRN Meds: acetaminophen **OR** acetaminophen, bisacodyl, guaiFENesin, guaiFENesin, hydrALAZINE, melatonin, ondansetron **OR** ondansetron (ZOFRAN) IV, oxyCODONE-acetaminophen, polyethylene glycol Continuous Infusions:   LOS: 2 days  Time spent: Greater than 50% of the 35 minute visit was spent in counseling/coordination of care for the patient as laid out in the A&P.   Dwyane Dee, MD [facility] 06/13/2020, 5:00 PM

## 2020-06-14 ENCOUNTER — Inpatient Hospital Stay (HOSPITAL_COMMUNITY): Payer: Medicare PPO | Admitting: Anesthesiology

## 2020-06-14 ENCOUNTER — Encounter (HOSPITAL_COMMUNITY)
Admission: EM | Disposition: A | Discharge status: Home / Self Care | Payer: Self-pay | Source: Home / Self Care | Attending: Internal Medicine

## 2020-06-14 ENCOUNTER — Encounter (HOSPITAL_COMMUNITY): Payer: Self-pay | Admitting: Internal Medicine

## 2020-06-14 DIAGNOSIS — N186 End stage renal disease: Secondary | ICD-10-CM

## 2020-06-14 DIAGNOSIS — L02611 Cutaneous abscess of right foot: Secondary | ICD-10-CM

## 2020-06-14 HISTORY — PX: I & D EXTREMITY: SHX5045

## 2020-06-14 LAB — CBC WITH DIFFERENTIAL/PLATELET
Abs Immature Granulocytes: 0.03 10*3/uL (ref 0.00–0.07)
Basophils Absolute: 0.1 10*3/uL (ref 0.0–0.1)
Basophils Relative: 1 %
Eosinophils Absolute: 0.2 10*3/uL (ref 0.0–0.5)
Eosinophils Relative: 3 %
HCT: 27.3 % — ABNORMAL LOW (ref 36.0–46.0)
Hemoglobin: 9 g/dL — ABNORMAL LOW (ref 12.0–15.0)
Immature Granulocytes: 0 %
Lymphocytes Relative: 20 %
Lymphs Abs: 1.4 10*3/uL (ref 0.7–4.0)
MCH: 27.4 pg (ref 26.0–34.0)
MCHC: 33 g/dL (ref 30.0–36.0)
MCV: 83 fL (ref 80.0–100.0)
Monocytes Absolute: 1.1 10*3/uL — ABNORMAL HIGH (ref 0.1–1.0)
Monocytes Relative: 16 %
Neutro Abs: 4.1 10*3/uL (ref 1.7–7.7)
Neutrophils Relative %: 60 %
Platelets: 342 10*3/uL (ref 150–400)
RBC: 3.29 MIL/uL — ABNORMAL LOW (ref 3.87–5.11)
RDW: 17 % — ABNORMAL HIGH (ref 11.5–15.5)
WBC: 7 10*3/uL (ref 4.0–10.5)
nRBC: 0 % (ref 0.0–0.2)

## 2020-06-14 LAB — BASIC METABOLIC PANEL
Anion gap: 13 (ref 5–15)
BUN: 15 mg/dL (ref 8–23)
CO2: 26 mmol/L (ref 22–32)
Calcium: 9.4 mg/dL (ref 8.9–10.3)
Chloride: 101 mmol/L (ref 98–111)
Creatinine, Ser: 3.55 mg/dL — ABNORMAL HIGH (ref 0.44–1.00)
GFR, Estimated: 13 mL/min — ABNORMAL LOW (ref >60)
Glucose, Bld: 79 mg/dL (ref 70–99)
Potassium: 3.5 mmol/L (ref 3.5–5.1)
Sodium: 140 mmol/L (ref 135–145)

## 2020-06-14 LAB — MAGNESIUM: Magnesium: 2.1 mg/dL (ref 1.7–2.4)

## 2020-06-14 LAB — GLUCOSE, CAPILLARY: Glucose-Capillary: 65 mg/dL — ABNORMAL LOW (ref 70–99)

## 2020-06-14 SURGERY — IRRIGATION AND DEBRIDEMENT EXTREMITY
Anesthesia: Regional | Site: Foot | Laterality: Right

## 2020-06-14 MED ORDER — SODIUM CHLORIDE 0.9 % IV SOLN: INTRAVENOUS | Status: DC

## 2020-06-14 MED ORDER — LIDOCAINE HCL (CARDIAC) PF 100 MG/5ML IV SOSY
PREFILLED_SYRINGE | INTRAVENOUS | Status: DC | PRN
  Administered 2020-06-14: 40 mg via INTRAVENOUS

## 2020-06-14 MED ORDER — PROPOFOL 10 MG/ML IV BOLUS
INTRAVENOUS | Status: DC | PRN
  Administered 2020-06-14 (×2): 20 mg via INTRAVENOUS

## 2020-06-14 MED ORDER — FENTANYL CITRATE (PF) 100 MCG/2ML IJ SOLN
25.0000 ug | INTRAMUSCULAR | Status: DC | PRN
  Administered 2020-06-14: 50 ug via INTRAVENOUS

## 2020-06-14 MED ORDER — FENTANYL CITRATE (PF) 250 MCG/5ML IJ SOLN
INTRAMUSCULAR | Status: AC
  Filled 2020-06-14: qty 5

## 2020-06-14 MED ORDER — CHLORHEXIDINE GLUCONATE 0.12 % MT SOLN
OROMUCOSAL | Status: AC
  Administered 2020-06-14: 15 mL via OROMUCOSAL
  Filled 2020-06-14: qty 15

## 2020-06-14 MED ORDER — CHLORHEXIDINE GLUCONATE 0.12 % MT SOLN
15.0000 mL | OROMUCOSAL | Status: AC
  Filled 2020-06-14: qty 15

## 2020-06-14 MED ORDER — FENTANYL CITRATE (PF) 100 MCG/2ML IJ SOLN
INTRAMUSCULAR | Status: AC
  Filled 2020-06-14: qty 2

## 2020-06-14 MED ORDER — VANCOMYCIN HCL 1000 MG IV SOLR
INTRAVENOUS | Status: AC
  Filled 2020-06-14: qty 2000

## 2020-06-14 MED ORDER — TOBRAMYCIN SULFATE 1.2 G IJ SOLR
INTRAMUSCULAR | Status: AC
  Filled 2020-06-14: qty 2.4

## 2020-06-14 MED ORDER — LIDOCAINE HCL 2 % IJ SOLN
INTRAMUSCULAR | Status: AC
  Filled 2020-06-14: qty 80

## 2020-06-14 MED ORDER — PROPOFOL 10 MG/ML IV BOLUS
INTRAVENOUS | Status: AC
  Filled 2020-06-14: qty 40

## 2020-06-14 MED ORDER — BUPIVACAINE HCL 0.5 % IJ SOLN
INTRAMUSCULAR | Status: AC
  Filled 2020-06-14: qty 2

## 2020-06-14 MED ORDER — FENTANYL CITRATE (PF) 100 MCG/2ML IJ SOLN
INTRAMUSCULAR | Status: DC | PRN
  Administered 2020-06-14 (×2): 25 ug via INTRAVENOUS

## 2020-06-14 MED ORDER — ROPIVACAINE HCL 7.5 MG/ML IJ SOLN
INTRAMUSCULAR | Status: DC | PRN
  Administered 2020-06-14: 13 mL via PERINEURAL
  Administered 2020-06-14: 7 mL via PERINEURAL

## 2020-06-14 MED ORDER — LIDOCAINE 2% (20 MG/ML) 5 ML SYRINGE
INTRAMUSCULAR | Status: AC
  Filled 2020-06-14: qty 5

## 2020-06-14 MED ORDER — SODIUM CHLORIDE 0.9 % IR SOLN
Status: DC | PRN
  Administered 2020-06-14: 1000 mL

## 2020-06-14 MED ORDER — LIDOCAINE HCL (PF) 2 % IJ SOLN
INTRAMUSCULAR | Status: DC | PRN
  Administered 2020-06-14: 40 mg via PERINEURAL
  Administered 2020-06-14: 60 mg via PERINEURAL

## 2020-06-14 MED ORDER — PROPOFOL 1000 MG/100ML IV EMUL
INTRAVENOUS | Status: AC
  Filled 2020-06-14: qty 100

## 2020-06-14 SURGICAL SUPPLY — 34 items
APL PRP STRL LF DISP 70% ISPRP (MISCELLANEOUS) ×1
BLADE SAW SGTL 83.5X18.5 (BLADE) ×2 IMPLANT
BLADE SURG 15 STRL LF DISP TIS (BLADE) ×3 IMPLANT
BLADE SURG 15 STRL SS (BLADE) ×6
BNDG CMPR 9X4 STRL LF SNTH (GAUZE/BANDAGES/DRESSINGS) ×1
BNDG ELASTIC 3X5.8 VLCR STR LF (GAUZE/BANDAGES/DRESSINGS) ×1 IMPLANT
BNDG ESMARK 4X9 LF (GAUZE/BANDAGES/DRESSINGS) ×2 IMPLANT
BNDG GAUZE ELAST 4 BULKY (GAUZE/BANDAGES/DRESSINGS) ×3 IMPLANT
CHLORAPREP W/TINT 26 (MISCELLANEOUS) ×2 IMPLANT
COVER SURGICAL LIGHT HANDLE (MISCELLANEOUS) ×2 IMPLANT
CUFF TOURN SGL QUICK 18X4 (TOURNIQUET CUFF) ×2 IMPLANT
DRSG XEROFORM 1X8 (GAUZE/BANDAGES/DRESSINGS) ×1 IMPLANT
GAUZE SPONGE 4X4 12PLY STRL (GAUZE/BANDAGES/DRESSINGS) ×2 IMPLANT
GAUZE XEROFORM 1X8 LF (GAUZE/BANDAGES/DRESSINGS) ×2 IMPLANT
GLOVE BIO SURGEON STRL SZ8 (GLOVE) ×3 IMPLANT
GLOVE SRG 8 PF TXTR STRL LF DI (GLOVE) ×1 IMPLANT
GLOVE SURG UNDER POLY LF SZ8 (GLOVE) ×2
GOWN STRL REUS W/ TWL LRG LVL3 (GOWN DISPOSABLE) ×2 IMPLANT
GOWN STRL REUS W/TWL LRG LVL3 (GOWN DISPOSABLE) ×4
KIT BASIN OR (CUSTOM PROCEDURE TRAY) ×2 IMPLANT
KIT TURNOVER KIT B (KITS) ×2 IMPLANT
MANIFOLD NEPTUNE II (INSTRUMENTS) ×2 IMPLANT
NS IRRIG 1000ML POUR BTL (IV SOLUTION) ×2 IMPLANT
PACK ORTHO EXTREMITY (CUSTOM PROCEDURE TRAY) ×2 IMPLANT
SOL PREP POV-IOD 4OZ 10% (MISCELLANEOUS) ×3 IMPLANT
STAPLER VISISTAT (STAPLE) ×2 IMPLANT
STAPLER VISISTAT 35W (STAPLE) ×2 IMPLANT
STOCKINETTE IMPERVIOUS 9X36 MD (GAUZE/BANDAGES/DRESSINGS) ×2 IMPLANT
SWAB CULTURE LIQ STUART DBL (MISCELLANEOUS) ×2 IMPLANT
SWAB CULTURE LIQUID MINI MALE (MISCELLANEOUS) ×2 IMPLANT
TOWEL GREEN STERILE (TOWEL DISPOSABLE) ×2 IMPLANT
TOWEL GREEN STERILE FF (TOWEL DISPOSABLE) ×2 IMPLANT
TUBE CONNECTING 12X1/4 (SUCTIONS) ×2 IMPLANT
YANKAUER SUCT BULB TIP NO VENT (SUCTIONS) ×2 IMPLANT

## 2020-06-14 NOTE — Interval H&P Note (Signed)
History and Physical Interval Note:  06/14/2020 5:21 PM  [name]  has presented today for surgery, with the diagnosis of cellulitis of foot.  The various methods of treatment have been discussed with the patient and family. After consideration of risks, benefits and other options for treatment, the patient has consented to  Procedure(s): IRRIGATION AND DEBRIDEMENT FOOT (Right) as a surgical intervention.  The patient's history has been reviewed, patient examined, no change in status, stable for surgery.  I have reviewed the patient's chart and labs.  Questions were answered to the patient's satisfaction.     Edrick Kins

## 2020-06-14 NOTE — Progress Notes (Signed)
Hypoglycemic Event  CBG: 65  Asymptomatic  Comments/MD notified:Dr. Lissa Hoard did not want to treat as long as she was asymptomatic.     Wilfrid Lund

## 2020-06-14 NOTE — Anesthesia Procedure Notes (Signed)
Procedure Name: MAC Date/Time: 06/14/2020 6:55 PM Performed by: Inda Coke, CRNA Pre-anesthesia Checklist: Patient identified, Emergency Drugs available, Suction available, Timeout performed and Patient being monitored Patient Re-evaluated:Patient Re-evaluated prior to induction Oxygen Delivery Method: Simple face mask Induction Type: IV induction Dental Injury: Teeth and Oropharynx as per pre-operative assessment

## 2020-06-14 NOTE — Anesthesia Procedure Notes (Signed)
Anesthesia Regional Block: Adductor canal block   Pre-Anesthetic Checklist: ,, timeout performed, Correct Patient, Correct Site, Correct Laterality, Correct Procedure, Correct Position, site marked, Risks and benefits discussed,  Surgical consent,  Pre-op evaluation,  At surgeon's request and post-op pain management  Laterality: Right  Prep: chloraprep       Needles:  Injection technique: Single-shot  Needle Type: Stimiplex     Needle Length: 9cm  Needle Gauge: 21     Additional Needles:   Procedures:,,,, ultrasound used (permanent image in chart),,,,  Narrative:  Start time: 06/14/2020 5:12 PM End time: 06/14/2020 5:27 PM Injection made incrementally with aspirations every 5 mL.  Performed by: Personally  Anesthesiologist: Nolon Nations, MD  Additional Notes: BP cuff, EKG monitors applied. Sedation begun. Artery and nerve location verified with U/S and anesthetic injected incrementally, slowly, and after negative aspirations under direct u/s guidance. Good fascial /perineural spread. Tolerated well.

## 2020-06-14 NOTE — Anesthesia Preprocedure Evaluation (Addendum)
Anesthesia Evaluation  Patient identified by MRN, date of birth, ID band Patient awake    Reviewed: Allergy & Precautions, Patient's Chart, lab work & pertinent test results  Airway Mallampati: I  TM Distance: >3 FB Neck ROM: Full    Dental  (+) Dental Advisory Given, Missing   Pulmonary pneumonia, Current Smoker and Patient abstained from smoking., former smoker,    + rhonchi  + decreased breath sounds      Cardiovascular hypertension, + dysrhythmias + Valvular Problems/Murmurs  Rhythm:Regular Rate:Normal + Systolic murmurs Echo 1031 - Left ventricle: The cavity size was normal. There was  moderate concentric hypertrophy. Systolic function was  vigorous. The estimated ejection fraction was in the range of 65% to 70%. Wall motion was normal; there were no regional wall motion abnormalities. Doppler parameters are consistent with abnormal left ventricular relaxation (grade 1 diastolic dysfunction).  - Aortic valve: Leaflet opening appears severely restricted  although this is not reflected in the valvular velocities  or gradient. Valve mobility was restricted. There was mild to moderate stenosis by velocity. Valve area:  2.15cm^2(VTI). Valve area: 1.81cm^2 (Vmax).  - Mitral valve: Calcified annulus. Moderately calcified  leaflets .  - Pulmonary arteries: Systolic pressure was mildly  increased. PA peak pressure: 47m Hg (S).     Neuro/Psych  Headaches, Seizures -,  PSYCHIATRIC DISORDERS  Neuromuscular disease    GI/Hepatic (+) Hepatitis -, C  Endo/Other  diabetes  Renal/GU Dialysis and ESRFRenal disease     Musculoskeletal  (+) Arthritis ,   Abdominal   Peds  Hematology  (+) Blood dyscrasia, anemia ,   Anesthesia Other Findings   Reproductive/Obstetrics                            Anesthesia Physical  Anesthesia Plan  ASA: IV  Anesthesia Plan: MAC   Post-op Pain  Management:    Induction: Intravenous  PONV Risk Score and Plan: 2 and Ondansetron, TIVA and Treatment may vary due to age or medical condition  Airway Management Planned:   Additional Equipment: None  Intra-op Plan:   Post-operative Plan:   Informed Consent: I have reviewed the patients History and Physical, chart, labs and discussed the procedure including the risks, benefits and alternatives for the proposed anesthesia with the patient or authorized representative who has indicated his/her understanding and acceptance.     Dental advisory given  Plan Discussed with: CRNA  Anesthesia Plan Comments:        Anesthesia Quick Evaluation

## 2020-06-14 NOTE — Transfer of Care (Signed)
Immediate Anesthesia Transfer of Care Note  Patient: [name]  Procedure(s) Performed: IRRIGATION AND DEBRIDEMENT FOOT (Right Foot)  Patient Location: PACU  Anesthesia Type:Regional  Level of Consciousness: awake, alert  and oriented  Airway & Oxygen Therapy: Patient Spontanous Breathing and Patient connected to nasal cannula oxygen  Post-op Assessment: Report given to RN and Post -op Vital signs reviewed and stable  Post vital signs: Reviewed and stable  Last Vitals:  Vitals Value Taken Time  BP 132/103 06/14/20 1835  Temp    Pulse 94 06/14/20 1836  Resp 19 06/14/20 1836  SpO2 99 % 06/14/20 1836  Vitals shown include unvalidated device data.  Last Pain:  Vitals:   06/14/20 1100  TempSrc: Oral  PainSc:       Patients Stated Pain Goal: 2 ([id number])  Complications: No complications documented.

## 2020-06-14 NOTE — Brief Op Note (Signed)
06/14/2020  7:05 PM  PATIENT:  Ovidio Hanger  70 y.o. female  PRE-OPERATIVE DIAGNOSIS:  cellulitis of foot  POST-OPERATIVE DIAGNOSIS:  cellulitis of foot  PROCEDURE:  Procedure(s): IRRIGATION AND DEBRIDEMENT FOOT (Right)  SURGEON:  Surgeon(s) and Role:    Edrick Kins, DPM - Primary  PHYSICIAN ASSISTANT:   ASSISTANTS: none   ANESTHESIA:   regional and MAC  EBL:  68m   BLOOD ADMINISTERED:none  DRAINS: none   LOCAL MEDICATIONS USED:  NONE  SPECIMEN:  Source of Specimen:  Abscess right foot  DISPOSITION OF SPECIMEN:  PATHOLOGY  COUNTS:  YES  TOURNIQUET:  * No tourniquets in log *  DICTATION: .Dragon Dictation  PLAN OF CARE: Admit to inpatient   PATIENT DISPOSITION:  PACU - hemodynamically stable.   Delay start of Pharmacological VTE agent (>24hrs) due to surgical blood loss or risk of bleeding: not applicable

## 2020-06-14 NOTE — Evaluation (Signed)
Physical Therapy Evaluation Patient Details Name: [name] MRN: [id number] DOB: 1950/08/16 Today's Date: 06/14/2020   History of Present Illness  Patient is a 70 y.o. female presenting with generalized body aches, leg pain and back pain. Patient admitted with cellulitis of RLE and hyperkalemia. PMHx significant for seizures, HTN, chronic hepatitis C, ESRD on HD TTS, DM2, ischemic coloitis s/p hemicolectomy and ileostomy, brain aneursym.  Clinical Impression   Pt admitted with above diagnosis. Comes from home where she lives with family; Uses a wheelchair for mobility, and currently depends on her daughter for transfers; Pt told me her daughter does most of the work of the transfer (indicative of Max assist);  Presents to PT with functional dependencies, decr sitting balance, Weakness, and incr fall risk;  currently requires Max/Total assist for basic squat pivot OOB to recliner; Pt currently with functional limitations due to the deficits listed below (see PT Problem List). Pt will benefit from skilled PT to increase their independence and safety with mobility to allow discharge to the venue listed below.    Agree with Palliative Care consult;   She is currently appropriate for SNF level of care -- the decision re: possible rehab at SNF will depend on pt's overall Goals of Care; Post-acute rehab is not ENTIRELY unreasonable; it could help decr burden of care, and make it easier to assist [name]; Still, she has been dependent for quite some time.       Follow Up Recommendations SNF    Equipment Recommendations  Wheelchair (measurements PT);Wheelchair cushion (measurements PT);Other (comment) (IF For home, consider hoyer lift and air mattress)    Recommendations for Other Services       Precautions / Restrictions Precautions Precautions: Fall (foot drop) Precaution Comments: High fall risk, increased risk of skin breakdown      Mobility  Bed Mobility Overal bed  mobility: Needs Assistance Bed Mobility: Rolling;Sidelying to Sit Rolling: Mod assist Sidelying to sit: Max assist       General bed mobility comments: Mod A for rolling R<>L in supine with cues for hand placement. Max A for transition to and from EOB at trunk and BLE.    Transfers Overall transfer level: Needs assistance   Transfers: Squat Pivot Transfers     Squat pivot transfers: Max assist     General transfer comment: Max assist and knees blocked to perform basic spuat pivot transfer  Ambulation/Gait                Stairs            Wheelchair Mobility    Modified Rankin (Stroke Patients Only)       Balance     Sitting balance-Leahy Scale: Poor Sitting balance - Comments: Requires Mod A progressing to Min A to maintain static sitting balance at EOB. Requires increased assist with fatigue. Postural control: Posterior lean;Right lateral lean;Left lateral lean (LOB in all directions)                                   Pertinent Vitals/Pain Pain Assessment: Faces Faces Pain Scale: Hurts little more Pain Location: Dorsum of R foot Pain Descriptors / Indicators: Sharp Pain Intervention(s): Limited activity within patient's tolerance;Monitored during session    Home Living Family/patient expects to be discharged to:: Private residence Living Arrangements: Other relatives (Granddaughter and her 2 children) Available Help at Discharge: Family;Available PRN/intermittently Type of Home: House Home Access:  Level entry     Home Layout: One level Home Equipment: Other (comment);Walker - 2 wheels;Wheelchair - manual      Prior Function Level of Independence: Needs assistance   Gait / Transfers Assistance Needed: Patient reports requiring assist for all functional transfers. Has mostly been bedbound for last several weeks. Does not remember the last time she got to the wc.  ADL's / Homemaking Assistance Needed: Assist for all ADLs  including bathing/dressing. Reports inability to move bowels x3 weeks. Does not produce urine.        Hand Dominance   Dominant Hand: Left    Extremity/Trunk Assessment   Upper Extremity Assessment Upper Extremity Assessment: Defer to OT evaluation    Lower Extremity Assessment Lower Extremity Assessment: RLE deficits/detail;LLE deficits/detail RLE Deficits / Details: Notable R foot drop; abscess on dorsum of foot; hip and knee AROM WFL; gorssly decr strength, with decr muscle activationand strength to come into stand, or produce a wiehgt bearing response when center of mass is translated over feet LLE Deficits / Details: Notable R foot drop; hip and knee AROM WFL; gorssly decr strength, with decr muscle activationand strength to come into stand, or produce a wiehgt bearing response when center of mass is translated over feet    Cervical / Trunk Assessment Cervical / Trunk Assessment: Kyphotic  Communication   Communication: No difficulties  Cognition Arousal/Alertness: Awake/alert Behavior During Therapy: WFL for tasks assessed/performed Overall Cognitive Status: No family/caregiver present to determine baseline cognitive functioning                                 General Comments: Patient oriented to person, place, and partially to situation. Follows commands appropriately. No family present at bedside to confirm baseline cognition.      General Comments      Exercises     Assessment/Plan    PT Assessment Patient needs continued PT services  PT Problem List Decreased strength;Decreased activity tolerance;Decreased balance;Decreased mobility;Decreased knowledge of use of DME;Decreased safety awareness;Decreased skin integrity       PT Treatment Interventions DME instruction;Functional mobility training;Therapeutic activities;Therapeutic exercise;Balance training;Cognitive remediation;Patient/family education;Wheelchair mobility training    PT Goals  (Current goals can be found in the Care Plan section)  Acute Rehab PT Goals Patient Stated Goal: To get stronger. PT Goal Formulation: With patient Time For Goal Achievement: 06/28/20 Potential to Achieve Goals: Fair    Frequency Min 2X/week   Barriers to discharge        Co-evaluation               AM-PAC PT "6 Clicks" Mobility  Outcome Measure Help needed turning from your back to your side while in a flat bed without using bedrails?: A Lot Help needed moving from lying on your back to sitting on the side of a flat bed without using bedrails?: A Lot Help needed moving to and from a bed to a chair (including a wheelchair)?: Total Help needed standing up from a chair using your arms (e.g., wheelchair or bedside chair)?: Total Help needed to walk in hospital room?: Total Help needed climbing 3-5 steps with a railing? : Total 6 Click Score: 8    End of Session Equipment Utilized During Treatment: Gait belt Activity Tolerance: Patient tolerated treatment well Patient left: in chair;with call bell/phone within reach;with chair alarm set;Other (comment) (Chair alarm not plugged into wall due to soft touch call bell plugged in; Nurse  Tech aware) Nurse Communication: Mobility status PT Visit Diagnosis: Other abnormalities of gait and mobility (R26.89);Muscle weakness (generalized) (M62.81)    Time: [id number] PT Time Calculation (min) (ACUTE ONLY): 24 min   Charges:   PT Evaluation $PT Eval Moderate Complexity: 1 Mod PT Treatments $Therapeutic Activity: 8-22 mins        Roney Marion, PT  Acute Rehabilitation Services Pager [phone] Office [phone]   [place]ta [place] 06/14/2020, 2:35 PM

## 2020-06-14 NOTE — Progress Notes (Addendum)
Subjective: Tolerated dialysis yesterday on schedule and noted for I&D of right foot abscess by podiatry today  Objective Vital signs in last 24 hours: Vitals:   06/13/20 1624 06/13/20 2043 06/14/20 0447 06/14/20 1100  BP: 130/84 (!) 108/54 123/65 122/63  Pulse: (!) 108 99 86 98  Resp: '18 15 18 16  '$ Temp: 98.6 F (37 C) 98.9 F (37.2 C) 98.4 F (36.9 C) 98.4 F (36.9 C)  TempSrc: Oral Oral  Oral  SpO2: 98% 99% 100% 96%  Weight:  39.1 kg    Height:       Weight change: -4.6 kg  Physical Exam:  General: pleasant thin elderly female chronically ill-appearing NAD Heart:RRR,2 /6stock ejection murmur no rub or gallop Lungs: CTA, nonlabored breathing Abdomen:Bowel sounds positive soft nontender, ND Extremities:Right foot dorsum edema improved, bilateral feet dressings dry intact  Dialysis Access:  RUA AVGG positive bruit  Dialysis Orders: Center:[facility] KCon TTS. [id number] kgHD Bath 2K, 2.5 CA time Heparin none, RUA AVGG  Mircera 48mg every 2 weeksIlast given 05/29/2020  Problem/Plan: 1. Right foot cellulitis/wound=ABX per admit team Rocephin/vancomycin, plan work-up per admit MRI results pending 2. Hyperkalemia=secondary to missed hemodialysis HD, a.m. K3.5  follow-up labs 3. ESRD -HD TTS,HD  now on schedule in hospital, a.m. K 3.5   4. Hypertension/volume -BP stable UF as tolerated in HD, no significant volume overload 5. Anemiaof ESRD-Hgb 8.4> 8.1,> 9.0Aranesp dose  60 mcg  8/29 q. Tuesday dialysis  6. Metabolic bone disease -calcium stable, no vitamin D phosphorus 3.1,  no phosphate binder needed with decreased p.o. take 7. Chronic arthritic pain on Percocet every 6 hours 8. COPD chronic tobacco use advised patient to quit 9. History of COVID=not in isolation 10. Chronic hepatitis C 11. Nutrition -Albumin 3.4, renal diet,renal vitamin give nephro for protein supplement 12. Goals of care=as per admit patient and family definitely want to continue  dialysis now waiting outpatient palliative care evaluation,"but would not consider hospice" discussed as outpatientat her OP [facility]= Dr. UHollie Salkwas addressing this also, needing palliative  care  input   DErnest Haber PA-C [phone]/30/2022,12:32 PM  LOS: 3 days   Labs: Basic Metabolic Panel: Recent Labs  Lab 06/12/20 0315 06/13/20 0310 06/14/20 0723  NA 135 139 140  K 5.6* 3.7 3.5  CL 96* 97* 101  CO2 19* 28 26  GLUCOSE 90 94 79  BUN 64* 32* 15  CREATININE 8.89* 5.52* 3.55*  CALCIUM 8.2* 8.6* 9.4   Liver Function Tests: Recent Labs  Lab 06/11/20 1447  AST 29  ALT 8  ALKPHOS 52  BILITOT 1.8*  PROT 8.2*  ALBUMIN 3.4*   No results for input(s): LIPASE, AMYLASE in the last 168 hours. No results for input(s): AMMONIA in the last 168 hours. CBC: Recent Labs  Lab 06/11/20 1447 06/12/20 0315 06/13/20 0310 06/14/20 0723  WBC 7.9 11.1* 7.1 7.0  NEUTROABS 5.1  --   --  4.1  HGB 9.5* 8.4* 8.1* 9.0*  HCT 30.1* 26.2* 24.5* 27.3*  MCV 86.0 83.2 83.6 83.0  PLT 394 339 377 342   Cardiac Enzymes: No results for input(s): CKTOTAL, CKMB, CKMBINDEX, TROPONINI in the last 168 hours. CBG: Recent Labs  Lab 06/11/20 1836 06/12/20 1217  GLUCAP 96 70     Medications: . cefTRIAXone (ROCEPHIN)  IV     . Chlorhexidine Gluconate Cloth  6 each Topical Q0600  . [START ON 06/20/2020] darbepoetin (ARANESP) injection - DIALYSIS  60 mcg Intravenous Q Tue-HD  .  feeding supplement (NEPRO CARB STEADY)  237 mL Oral TID BM  . heparin  5,000 Units Subcutaneous Q8H  . multivitamin  1 tablet Oral QHS  . neomycin-bacitracin-polymyxin  1 application Topical BID  . senna-docusate  1 tablet Oral BID  . [START ON 06/15/2020] vancomycin  500 mg Intravenous Q T,Th,Sa-HD

## 2020-06-14 NOTE — Anesthesia Procedure Notes (Signed)
Anesthesia Regional Block: Popliteal block   Pre-Anesthetic Checklist: ,, timeout performed, Correct Patient, Correct Site, Correct Laterality, Correct Procedure, Correct Position, site marked, Risks and benefits discussed,  Surgical consent,  Pre-op evaluation,  At surgeon's request and post-op pain management  Laterality: Right  Prep: chloraprep       Needles:  Injection technique: Single-shot  Needle Type: Stimiplex     Needle Length: 10cm  Needle Gauge: 21     Additional Needles:   Procedures:,,,, ultrasound used (permanent image in chart),,,,  Motor weakness within 5 minutes.  Narrative:  Start time: 06/14/2020 5:27 PM End time: 06/14/2020 5:37 PM Injection made incrementally with aspirations every 5 mL.  Performed by: Personally  Anesthesiologist: Nolon Nations, MD  Additional Notes: Nerve located and needle positioned with direct ultrasound guidance. Good perineural spread. Patient tolerated well.

## 2020-06-14 NOTE — Progress Notes (Signed)
PROGRESS NOTE    [name]   [id number]  DOB: December 05, 1950  DOA: 06/11/2020     3  PCP: Jenna Morning, DO  CC: foot pain   Hospital Course: [name] is a 70 yo female with PMH ESRD on HD TTS (misses sessions due to pain), HTN, DMII, arthritis, mild AS, chronic Hep C who presented to the ER with generalized body aches, right foot pain, and her chronic ongoing neck and lower back pain. Work-up was significant for hyperkalemia due to missed dialysis sessions.  She was also noted to have drainage and a foot wound on the dorsal aspect of her right foot.  Family showed a picture from home of the swelling on her foot with purulent drainage.  She was admitted for further work-up/evaluation and IV antibiotics.  Of note, she has been continuously losing weight despite "good appetite" per patient and family. There have also been discussions outpatient regarding palliative care, however no consideration for hospice has been made at this time.  Due to her ongoing decline, family is amenable for meeting with palliative care during this hospitalization for further discussions.  Interval History:  No events overnight.  Pain is improved some this Mccarthy.  Understands plan is for debridement of her foot later today.  ROS: Constitutional: positive for weight loss, negative for chills and fevers, Respiratory: negative for cough, Cardiovascular: negative for chest pain and Gastrointestinal: positive for constipation, negative for abdominal pain, nausea and vomiting  Assessment & Plan: Right foot cellulitis with abscess -Right foot does not appear overtly erythematous nor significantly tender to palpation however MRI foot shows multiloculated fluid collection measuring 4.8 x 4.7 x 1.6 cm -Continue vancomycin and Rocephin for now -Appreciate podiatry evaluation.  Patient will undergo I&D later today, 06/14/2020 -Bilateral lower extremity duplex negative for DVT performed on  06/11/2020  Hyperkalemia. ESRD on HD TTS Appreciate nephrology assistance. Continue HD per nephrology.  Patient received Lokelma in the ER.  HTN Blood pressure stable.  Anemia of chronic kidney disease Hemoglobin stable.  Monitor.  Chronic hep C Monitor.  Constipation X-ray abdomen Continue bowel regimen  Active smoker Counseled to quit. Monitor.  Goals of care conversation - family open to [facility] discussions; will consult inpatient - patient losing weight despite eating she says; also missing HD due to neck and low back pain (noted to have sacral decub ulcer)  Severe protein calorie malnutrition. - Patient's BMI is Body mass index is 14.34 kg/m.. - Patient has the following signs/symptoms consistent with PCM: (fat loss, muscle loss, muscle wasting, cachexia). - seen by RD, appreciate assistance. Continue plan per RD to include nepro shakes, magic cups  Stage II sacral medial pressure ulcer, POA. Continue foam dressing.  Pain control. - follow up [facility] consult   Old records reviewed in assessment of this patient  Antimicrobials: Rocephin 3/27 >current Vanc 3/27 >> current   DVT prophylaxis: heparin injection 5,000 Units Start: 06/11/20 2200   Code Status:   Code Status: Full Code Family Communication: daughter  Disposition Plan: Status is: Inpatient  Remains inpatient appropriate because:Ongoing diagnostic testing needed not appropriate for outpatient work up, IV treatments appropriate due to intensity of illness or inability to take PO and Inpatient level of care appropriate due to severity of illness   Dispo: The patient is from: Home              Anticipated d/c is to: Home              Patient  currently is not medically stable to d/c.   Difficult to place patient No  Risk of unplanned readmission score: Unplanned Admission- Pilot do not use: 18.35   Objective: Blood pressure 122/63, pulse 98, temperature 98.4 F (36.9 C), temperature source  Oral, resp. rate 16, height '5\' 5"'$  (1.651 m), weight 39.1 kg, SpO2 96 %.  Examination: General appearance: Frail and cachectic pleasant elderly woman resting in bed in no distress Head: Normocephalic, without obvious abnormality, atraumatic Eyes: EOMI Back: Stage II sacral pressure ulcer noted Lungs: clear to auscultation bilaterally Heart: regular rate and rhythm and S1, S2 normal Abdomen: normal findings: bowel sounds normal and soft, non-tender Extremities: Lower extremity edema approximately 1+.  Right foot noted with swelling on distal dorsal aspect; minimal tenderness to palpation.  No erythema or calor Skin: mobility and turgor normal Neurologic: Left hand partially contracted.  Left upper extremity flexed and partially contracted  Consultants:   Podiatry  Palliative care medicine  Procedures:     Data Reviewed: I have personally reviewed following labs and imaging studies Results for orders placed or performed during the hospital encounter of 06/11/20 (from the past 24 hour(s))  Basic metabolic panel     Status: Abnormal   Collection Time: 06/14/20  7:23 AM  Result Value Ref Range   Sodium 140 135 - 145 mmol/L   Potassium 3.5 3.5 - 5.1 mmol/L   Chloride 101 98 - 111 mmol/L   CO2 26 22 - 32 mmol/L   Glucose, Bld 79 70 - 99 mg/dL   BUN 15 8 - 23 mg/dL   Creatinine, Ser 3.55 (H) 0.44 - 1.00 mg/dL   Calcium 9.4 8.9 - 10.3 mg/dL   GFR, Estimated 13 (L) >60 mL/min   Anion gap 13 5 - 15  Magnesium     Status: None   Collection Time: 06/14/20  7:23 AM  Result Value Ref Range   Magnesium 2.1 1.7 - 2.4 mg/dL  CBC with Differential/Platelet     Status: Abnormal   Collection Time: 06/14/20  7:23 AM  Result Value Ref Range   WBC 7.0 4.0 - 10.5 K/uL   RBC 3.29 (L) 3.87 - 5.11 MIL/uL   Hemoglobin 9.0 (L) 12.0 - 15.0 g/dL   HCT 27.3 (L) 36.0 - 46.0 %   MCV 83.0 80.0 - 100.0 fL   MCH 27.4 26.0 - 34.0 pg   MCHC 33.0 30.0 - 36.0 g/dL   RDW 17.0 (H) 11.5 - 15.5 %   Platelets  342 150 - 400 K/uL   nRBC 0.0 0.0 - 0.2 %   Neutrophils Relative % 60 %   Neutro Abs 4.1 1.7 - 7.7 K/uL   Lymphocytes Relative 20 %   Lymphs Abs 1.4 0.7 - 4.0 K/uL   Monocytes Relative 16 %   Monocytes Absolute 1.1 (H) 0.1 - 1.0 K/uL   Eosinophils Relative 3 %   Eosinophils Absolute 0.2 0.0 - 0.5 K/uL   Basophils Relative 1 %   Basophils Absolute 0.1 0.0 - 0.1 K/uL   Immature Granulocytes 0 %   Abs Immature Granulocytes 0.03 0.00 - 0.07 K/uL    Recent Results (from the past 240 hour(s))  Blood Culture (routine x 2)     Status: None (Preliminary result)   Collection Time: 06/11/20  2:47 PM   Specimen: BLOOD LEFT FOREARM  Result Value Ref Range Status   Specimen Description   Final    BLOOD LEFT FOREARM Performed at [facility], [address] [place], [place] [address]    Special Requests   Final    BOTTLES DRAWN AEROBIC AND ANAEROBIC Blood Culture adequate volume Performed at [facility] [address] [place], [place] [address]    Culture   Final    NO GROWTH 3 DAYS Performed at [facility], [place] [address], [place], [place] [address]    Report Status PENDING  Incomplete  Blood Culture (routine x 2)     Status: None (Preliminary result)   Collection Time: 06/11/20  8:45 PM   Specimen: BLOOD LEFT HAND  Result Value Ref Range Status   Specimen Description BLOOD LEFT HAND  Final   Special Requests   Final    BOTTLES DRAWN AEROBIC ONLY Blood Culture results may not be optimal due to an inadequate volume of blood received in culture bottles   Culture   Final    NO GROWTH 3 DAYS Performed at [facility], [place] [address] [place], [place] [address]    Report Status PENDING  Incomplete     Radiology Studies: DG Pelvis Portable  Result Date: 06/12/2020 CLINICAL DATA:  Pelvic pain EXAM: PORTABLE PELVIS 1-2 VIEWS COMPARISON:  None. FINDINGS: Pelvic ring is intact. No acute fracture or dislocation is noted. Postsurgical  changes in the lower lumbar spine noted. No soft tissue abnormality is seen. IMPRESSION: No acute abnormality noted. Electronically Signed   By: Inez Catalina M.D.   On: 06/12/2020 19:29   MR FOOT RIGHT WO CONTRAST  Result Date: 06/13/2020 CLINICAL DATA:  Foot swelling, redness, drainage from the wound EXAM: MRI OF THE RIGHT FOREFOOT WITHOUT CONTRAST TECHNIQUE: Multiplanar, multisequence MR imaging of the right foot was performed. No intravenous contrast was administered. COMPARISON:  None. FINDINGS: Bones/Joint/Cartilage No marrow signal abnormality. No fracture or dislocation. Normal alignment. No joint effusion. Ligaments Collateral ligaments are intact.  Lisfranc ligament is intact. Muscles and Tendons Flexor, peroneal and extensor compartment tendons are intact. Small amount of fluid in the flexor hallucis longus tendon sheath extending beyond the field of view. Muscles are normal. Soft tissue Complex multiloculated fluid collection along the dorsal aspect of the foot measuring 4.8 x 4.7 x 1.6 cm at the level of the metatarsals. Surrounding soft tissue edema concerning for cellulitis of the dorsal aspect of the foot IMPRESSION: 1. Complex multiloculated fluid collection along the dorsal aspect of the foot at the level of the metatarsals measuring 4.8 x 4.7 x 1.6 cm concerning for an abscess with cellulitis of the dorsal aspect of the foot. 2. No osteomyelitis of the right forefoot. Electronically Signed   By: Kathreen Devoid   On: 06/13/2020 12:24   DG CHEST PORT 1 VIEW  Result Date: 06/12/2020 CLINICAL DATA:  Chest pain EXAM: PORTABLE CHEST 1 VIEW COMPARISON:  05/25/2020 FINDINGS: Cardiac shadow is stable. Aortic calcifications are again seen. The lungs are well aerated bilaterally. Mild vascular prominence is noted likely related to fluid overload from end-stage renal disease. Additionally some patchy atelectatic changes are noted in the right upper lobe. Early infiltrate could not be totally excluded. No  bony abnormality is noted. IMPRESSION: Mild vascular prominence consistent with fluid overload. Patchy opacity in the right upper lobe which may represent acute infiltrate. Electronically Signed   By: Inez Catalina M.D.   On: 06/12/2020 19:27   DG Abd Portable 1V  Result Date: 06/13/2020 CLINICAL DATA:  No bowel movement EXAM: PORTABLE ABDOMEN - 1 VIEW COMPARISON:  06/12/2020 FINDINGS: Surgical hardware in the lumbar  spine. Nonobstructed bowel-gas pattern. No substantial stool burden. No radiopaque calculi. IMPRESSION: Nonobstructed bowel-gas pattern. Electronically Signed   By: Donavan Foil M.D.   On: 06/13/2020 19:53   DG Abd Portable 1V  Result Date: 06/12/2020 CLINICAL DATA:  Abdominal pain EXAM: PORTABLE ABDOMEN - 1 VIEW COMPARISON:  None. FINDINGS: Scattered large and small bowel gas is noted. No abnormal mass or abnormal calcifications are seen. No free air is seen. Postsurgical changes are noted in the lumbar spine as well as in the right abdomen. No acute bony abnormality is seen. IMPRESSION: No acute abnormality noted. Electronically Signed   By: Inez Catalina M.D.   On: 06/12/2020 19:28   DG Abd Portable 1V  Final Result    MR FOOT RIGHT WO CONTRAST  Final Result    DG Abd Portable 1V  Final Result    DG Pelvis Portable  Final Result    DG CHEST PORT 1 VIEW  Final Result    VAS [place] LOWER EXTREMITY VENOUS (DVT) (ONLY [facility] & [facility] 7a-7p)  Final Result    DG Foot Complete Right  Final Result      Scheduled Meds: . Chlorhexidine Gluconate Cloth  6 each Topical Q0600  . [START ON 06/20/2020] darbepoetin (ARANESP) injection - DIALYSIS  60 mcg Intravenous Q Tue-HD  . feeding supplement (NEPRO CARB STEADY)  237 mL Oral TID BM  . heparin  5,000 Units Subcutaneous Q8H  . multivitamin  1 tablet Oral QHS  . neomycin-bacitracin-polymyxin  1 application Topical BID  . senna-docusate  1 tablet Oral BID  . [START ON 06/15/2020] vancomycin  500 mg Intravenous Q T,Th,Sa-HD   PRN Meds:  acetaminophen **OR** acetaminophen, albuterol, bisacodyl, guaiFENesin, guaiFENesin, hydrALAZINE, melatonin, ondansetron **OR** ondansetron (ZOFRAN) IV, oxyCODONE-acetaminophen, polyethylene glycol, traZODone Continuous Infusions: . cefTRIAXone (ROCEPHIN)  IV       LOS: 3 days  Time spent: Greater than 50% of the 35 minute visit was spent in counseling/coordination of care for the patient as laid out in the A&P.   Dwyane Dee, MD [facility] 06/14/2020, 3:31 PM

## 2020-06-14 NOTE — Consult Note (Signed)
   [facility] Inpatient Consult   06/14/2020  VENNESA PATTY 08/02/1950 [id number]  [facility] [ACO] Patient: [name] PPO   Patient screened for less than 30 days hospitalization with noted and  to assess for potential [facility] [facility] service needs for post hospital transition.  Review of patient's medical record reveals patient is currently being recommended for a skilled nursing facility.   Plan:  Continue to follow progress and disposition to assess for post hospital care management needs.    For questions contact:   Natividad Brood, RN BSN [facility] Liaison  [phone] business mobile phone Toll free office [phone]  Fax number: [phone] [email or web address]

## 2020-06-15 ENCOUNTER — Telehealth: Payer: Self-pay | Admitting: Hospice

## 2020-06-15 DIAGNOSIS — Z992 Dependence on renal dialysis: Secondary | ICD-10-CM | POA: Diagnosis not present

## 2020-06-15 DIAGNOSIS — Z515 Encounter for palliative care: Secondary | ICD-10-CM

## 2020-06-15 DIAGNOSIS — Z7189 Other specified counseling: Secondary | ICD-10-CM

## 2020-06-15 DIAGNOSIS — I120 Hypertensive chronic kidney disease with stage 5 chronic kidney disease or end stage renal disease: Secondary | ICD-10-CM | POA: Diagnosis not present

## 2020-06-15 DIAGNOSIS — N186 End stage renal disease: Secondary | ICD-10-CM | POA: Diagnosis not present

## 2020-06-15 LAB — CBC WITH DIFFERENTIAL/PLATELET
Abs Immature Granulocytes: 0.05 10*3/uL (ref 0.00–0.07)
Basophils Absolute: 0.1 10*3/uL (ref 0.0–0.1)
Basophils Relative: 1 %
Eosinophils Absolute: 0.3 10*3/uL (ref 0.0–0.5)
Eosinophils Relative: 4 %
HCT: 26.4 % — ABNORMAL LOW (ref 36.0–46.0)
Hemoglobin: 8.3 g/dL — ABNORMAL LOW (ref 12.0–15.0)
Immature Granulocytes: 1 %
Lymphocytes Relative: 21 %
Lymphs Abs: 1.7 10*3/uL (ref 0.7–4.0)
MCH: 26.8 pg (ref 26.0–34.0)
MCHC: 31.4 g/dL (ref 30.0–36.0)
MCV: 85.2 fL (ref 80.0–100.0)
Monocytes Absolute: 1.2 10*3/uL — ABNORMAL HIGH (ref 0.1–1.0)
Monocytes Relative: 14 %
Neutro Abs: 4.8 10*3/uL (ref 1.7–7.7)
Neutrophils Relative %: 59 %
Platelets: 344 10*3/uL (ref 150–400)
RBC: 3.1 MIL/uL — ABNORMAL LOW (ref 3.87–5.11)
RDW: 17.4 % — ABNORMAL HIGH (ref 11.5–15.5)
WBC: 8.2 10*3/uL (ref 4.0–10.5)
nRBC: 0 % (ref 0.0–0.2)

## 2020-06-15 LAB — BASIC METABOLIC PANEL
Anion gap: 13 (ref 5–15)
BUN: 24 mg/dL — ABNORMAL HIGH (ref 8–23)
CO2: 26 mmol/L (ref 22–32)
Calcium: 9.1 mg/dL (ref 8.9–10.3)
Chloride: 101 mmol/L (ref 98–111)
Creatinine, Ser: 4.84 mg/dL — ABNORMAL HIGH (ref 0.44–1.00)
GFR, Estimated: 9 mL/min — ABNORMAL LOW (ref >60)
Glucose, Bld: 66 mg/dL — ABNORMAL LOW (ref 70–99)
Potassium: 3.7 mmol/L (ref 3.5–5.1)
Sodium: 140 mmol/L (ref 135–145)

## 2020-06-15 LAB — MAGNESIUM: Magnesium: 2.2 mg/dL (ref 1.7–2.4)

## 2020-06-15 LAB — PHOSPHORUS: Phosphorus: 4.4 mg/dL (ref 2.5–4.6)

## 2020-06-15 MED ORDER — OXYCODONE-ACETAMINOPHEN 5-325 MG PO TABS
ORAL_TABLET | ORAL | Status: AC
  Administered 2020-06-15: 1 via ORAL
  Filled 2020-06-15: qty 1

## 2020-06-15 MED ORDER — OXYCODONE-ACETAMINOPHEN 5-325 MG PO TABS
1.0000 | ORAL_TABLET | ORAL | Status: DC | PRN
  Administered 2020-06-15 – 2020-06-17 (×9): 1 via ORAL
  Filled 2020-06-15 (×8): qty 1

## 2020-06-15 MED ORDER — VANCOMYCIN HCL IN DEXTROSE 500-5 MG/100ML-% IV SOLN
INTRAVENOUS | Status: AC
  Filled 2020-06-15: qty 100

## 2020-06-15 NOTE — Anesthesia Postprocedure Evaluation (Signed)
Anesthesia Post Note  Patient: [name]  Procedure(s) Performed: IRRIGATION AND DEBRIDEMENT FOOT (Right Foot)     Patient location during evaluation: PACU Anesthesia Type: Regional Pain management: pain level controlled Vital Signs Assessment: post-procedure vital signs reviewed and stable Respiratory status: spontaneous breathing Cardiovascular status: stable Anesthetic complications: no   No complications documented.  Last Vitals:  Vitals:   06/15/20 1017 06/15/20 1640  BP: (!) 162/94 (!) 154/88  Pulse: 77 78  Resp: 19 18  Temp: 36.9 C 36.8 C  SpO2: 98% 99%    Last Pain:  Vitals:   06/15/20 1846  TempSrc:   PainSc: [place]

## 2020-06-15 NOTE — Progress Notes (Signed)
Pharmacy Antibiotic Note  [name] is a 70 y.o. female admitted on 06/11/2020 with cellulitis.  Pharmacy has been consulted for vancomycin dosing. Note patient with ESRD on HD reportedly with sessions on TuThSat.   The patient's Vancomycin level was elevated on 3/28 so the dose was held with HD on 3/29 with plans to resume with HD on 3/31. Given the patient's low weight - she will require more frequent level checks.   Noted I&D by podiatry on 3/30 - will follow up on culture results for antibiotic adjustments.  Plan: - Restart Vancomycin '500mg'$  IV qTTS on Thursday 3/31 after HD - Will continue to follow HD schedule/duration, culture results, LOT, and antibiotic de-escalation plans   Height: '5\' 5"'$  (165.1 cm) Weight: 39.4 kg (86 lb 13.8 oz) IBW/kg (Calculated) : 57  Temp (24hrs), Avg:98.1 F (36.7 C), Min:97.7 F (36.5 C), Max:98.5 F (36.9 C)  Recent Labs  Lab 06/11/20 1447 06/11/20 2045 06/12/20 0315 06/12/20 1432 06/13/20 0310 06/14/20 0723 06/15/20 0411  WBC 7.9  --  11.1*  --  7.1 7.0 8.2  CREATININE 8.56*  --  8.89*  --  5.52* 3.55* 4.84*  LATICACIDVEN 1.1 1.6  --   --   --   --   --   VANCORANDOM  --   --   --  31  --   --   --     Estimated Creatinine Clearance: 6.7 mL/min (A) (by C-G formula based on SCr of 4.84 mg/dL (H)).    Allergies  Allergen Reactions  . Iodinated Diagnostic Agents Other (See Comments)    Seziure   . Hydrocodone Nausea And Vomiting  . Metoprolol Nausea And Vomiting  . Iohexol Other (See Comments)    Reaction is convulsions    Vanc 3/27 >> Rocephin 3/27 >>  3/27 BCx: NGTD 3/30 R-foot abscess >>   Thank you for allowing pharmacy to be a part of this patient's care.  Alycia Rossetti, PharmD, BCPS Clinical Pharmacist Clinical phone for 06/15/2020: [phone] 06/15/2020 10:42 AM   **Pharmacist phone directory can now be found on [email or web address] ([email or web address]).  Listed under [place].

## 2020-06-15 NOTE — Progress Notes (Signed)
   Subjective:  Patient presents today status post incision and drainage right foot. DOS: 06/14/2020.  Patient states she is feels well.  She has no pain associated to the foot.  She denies nausea vomiting fever shortness of breath or chest pains.     Objective/Physical Exam Neurovascular status intact.  Skin incisions appear to be well coapted with sutures intact. No sign of infectious process noted. No dehiscence. No active bleeding noted. Moderate edema noted to the surgical extremity.  Assessment/Plan of Care: 1. s/p incision and drainage right foot. DOS: 06/14/2020 -Dressings changed today.  Nursing orders placed to change dressings as needed -Cultures pending.  No growth < 12 hours.  -From a podiatry/surgical standpoint of the foot, patient okay to be discharged.  Keep dressings clean dry and intact until follow-up in the office 1 week post discharge.  -Patient may weight-bear as tolerated -If the patient stays admitted through the weekend, will round to check on surgical site, otherwise podiatry will sign off.    Plan of Care:  1. Patient was evaluated. X-rays reviewed 2.    Edrick Kins, DPM [facility]  Dr. Edrick Kins, DPM    [address], [place] [address]                Office [phone]  Fax [phone]

## 2020-06-15 NOTE — Op Note (Signed)
   OPERATIVE REPORT Patient name: [name] MRN: [id number] DOB: [birth year]  DOS: 06/14/2020  Preop Dx: Abscess right foot Postop Dx: same  Procedure:  1.  Incision and drainage right foot  Surgeon: Edrick Kins DPM  Anesthesia: Monitored anesthesia care with local anesthesia block consisting of a 50-50 mixture of 2% lidocaine plain with 0.5% Marcaine plain totaling 10 mL  Hemostasis: No tourniquet utilized  EBL: 20 mL Materials: None Injectables: None Pathology: Deep wound cultures taken and sent to pathology for culture and sensitivity  Condition: The patient tolerated the procedure and anesthesia well. No complications noted or reported   Justification for procedure: The patient is a 70 y.o. female who presents today for surgical correction of an abscess to the dorsal aspect of the right foot visualized on MRI. The patient was told benefits as well as possible side effects of the surgery. The patient consented for surgical correction also with verbal consent from her legal guardian via telephone. All patient questions were answered. No guarantees were expressed or implied.  Procedure in Detail: The patient was brought to the operating room, remained in the hospital bed in the supine position at which time an aseptic scrub and drape were performed about the patient's respective lower extremity after anesthesia was induced as described above. Attention was then directed to the surgical area where procedure number one commenced.  Procedure #1: Incision and drainage right dorsal forefoot A 4 cm linear longitudinal skin incision was planned and made to the intermetatarsal space between the third and fourth metatarsals using a surgical #15 scalpel.  Incision was carried down to the level of subcutaneous tissue at which time there was purulent drainage expressed from the incision.  At this time deep wound cultures were taken and sent to pathology for culture and sensitivity.  Blunt  dissection using a curved mosquito was utilized to explore the incision site and all the soft tissue compartments of the dorsal aspect of the foot.  A collection of necrotic tissue debris, coagulated hematoma, and purulence was expressed from the incision site.  After incision and exploration of the abscess lesion, the area was copiously irrigated with normal saline and dried in preparation for primary closure.  Stainless steel skin staples were utilized for primary closure of the superficial skin edges.  Light packing utilized.  Dry sterile compressive dressings were then applied to all previously mentioned incision sites about the patient's lower extremity. The tourniquet which was used for hemostasis was deflated. All normal neurovascular responses including pink color and warmth returned all the digits of patient's lower extremity.  The patient was then transferred from the operating room to the recovery room having tolerated the procedure and anesthesia well. All vital signs are stable. After a brief stay in the recovery room the patient was readmitted to inpatient room.  Postoperative instructions provided.  We will continue to follow postoperatively while inpatient  Edrick Kins, DPM [facility]  Dr. Edrick Kins, DPM    [address], [place] [address]                Office [phone]  Fax [phone]

## 2020-06-15 NOTE — Consult Note (Signed)
Consultation Note Date: 06/15/2020   Patient Name: [name]  DOB: Oct 15, 1950  MRN: [id number]  Age / Sex: 70 y.o., female  PCP: Janie Morning, DO Referring Physician: Dwyane Dee, MD  Reason for Consultation: Establishing goals of care  HPI/Patient Profile: 70 y.o. female  with past medical history of ESRD on HD TTS (misses sessions due to pain), HTN, DMII, arthritis, mild AS, chronic Hep C  admitted on 06/11/2020 with generalized body aches, right foot pain, and her chronic ongoing neck and lower back pain. She was also noted to have drainage and a foot wound on the dorsal aspect of her right foot - underwent I&D on 06/14/20.Patient also with ongoing weight loss despite "good" appetite; BMI 14. PMT consulted to discuss [name].  Clinical Assessment and Goals of Care: I have reviewed medical records including EPIC notes, labs and imaging, assessed the patient and then met with patient (spoke to 2 daughters via telephone) to discuss diagnosis prognosis, GOC, EOL wishes, disposition and options.  I introduced Palliative Medicine as specialized medical care for people living with serious illness. It focuses on providing relief from the symptoms and stress of a serious illness. The goal is to improve quality of life for both the patient and the family.  As far as functional and nutritional status, patient shares she is bed or wheelchair bound. Dependent on others for daily care needs. Her daughters and granddaughter help take care of her. She tells me she has a good appetite - shows me her plate of food from lunch with about 3/4 of plate consumed. She tells me she does sometimes need to be fed by others. She tells me she eats well at home too. We discuss her weight loss despite good appetite.    We discussed patient's current illness and what it means in the larger context of patient's on-going co-morbidities.  Natural disease trajectory and  expectations at EOL were discussed. I attempted to elicit values and goals of care important to the patient. The difference between aggressive medical intervention and comfort care was considered in light of the patient's goals of care.   Patient tells me she is sometimes not sure if she wants to continue HD, thinks about stopping but ultimately decides to continue for her family. I ask her if she would like to discuss this further and she tells me no, she will continue for now.  I introduced a MOST form. We attempted to discuss the MOST form. She initially tells me she would like full code status. Encouraged patient to consider DNR/DNI status understanding evidenced based poor outcomes in similar hospitalized patients, as the cause of the arrest is likely associated with chronic/terminal disease rather than a reversible acute cardio-pulmonary event. She tells me she needs to think about this.   When we discuss the next option on the MOST form she tells me she wants comfort measures only, she does not want to return to the hospital any more. We discuss that this doesn't really align with her desire to continue HD. We discuss that comfort measures typically involve some sort of hospice support. She tells me she is unsure about her wishes. I offer a family meeting to her to include her daughters. She is agreeable to this.   Patient reports uncontrolled pain in right leg/foot. I increased frequency of her percocet from q6hr to q4hr.   I called and spoke to her daughters Elmyra Ricks and Mateo Flow. Discussed the role of palliative care. Discuss that palliative care has  been seeing patient at home - they tell me they were unaware of this. I share with them the notes from palliative care and we review topics discussed by palliative care. They do remember some of these meetings but were unaware this was palliative care.   They share they are interested in outpatient palliative but seem to indicate need for additional  care giving - we discuss the differences between palliative and hospice - discuss that palliative care does not assist with daily care giving the way hospice does. They express understanding.  We reviewed above - patient's unclear desires for goals of care. Daughters are agreeable to meeting tomorrow - planned at 11 am.   Discussed with patient/family the importance of continued conversation with family and the medical providers regarding overall plan of care and treatment options, ensuring decisions are within the context of the patient's values and GOCs.    Questions and concerns were addressed. The family was encouraged to call with questions or concerns.   Primary Decision Maker PATIENT - joined by daughters  SUMMARY OF RECOMMENDATIONS   - goals unclear - planned family meeting 4/1 at 72 am with daughters - Palliative to continue to follow outpatient -  increased frequency of her percocet from q6hr to q4hr  Code Status/Advance Care Planning:  Full code   Prognosis:   Unable to determine  Discharge Planning: Home with Palliative Services      Primary Diagnoses: Present on Admission: . ESRD (end stage renal disease) ([place])   I have reviewed the medical record, interviewed the patient and family, and examined the patient. The following aspects are pertinent.  Past Medical History:  Diagnosis Date  . Anemia    anemia of chronic disease likely 2/2 ESRD per last anemia panel (02/2011) with Fe 35, TIBC 167, ferritin 2041  // BL Hgb 8-10  . Anuria    Due to dialysis  . Aortic stenosis, mild    03/2011 echo  . Arthritis    Back  . Blood transfusion 1990's   r/t Kidney removal surgery  . Brain aneurysm    No records could be found  . Colitis, ischemic ([place]) 01/2011   S/P partial colectomy of right hemicolon and ileostomy placement  . Diabetes mellitus    Borderline  . Dialysis patient [facility])    [place] Tues,Thursday, saturday  . Dysrhythmia    SVT for brief  period in Feb 2013  . End stage renal disease on dialysis [facility])    Secondary to hypertension // T/Th/Sat dialysis on [employer]  . Foot drop, bilateral    after back surgery  . Heart murmur    Born with heart mumur, does not require follow up per pt  . Hepatitis C   . Hypertension    Does not see a heart doctor, had pre transplant stress test at [facility]    . Lumbar spinal stenosis     bilateral L4-5 and L5-S1 posterior lumbar interbody arthrodesis and bilateral L4-S1 posterior lateral arthrodesis for lumbar stenosis dynamic lumbar spondylolisthesis lumbar spondylosis lumbar radiculopathy with foot drop // s/p surgery 01/2011 - see surgery section for details.  . Seizures ([place])    r/t HTN in 1990's x 1  . Umbilical hernia age 89   Social History   Socioeconomic History  . Marital status: Legally Separated    Spouse name: Not on file  . Number of children: 2  . Years of education: 10th grade  . Highest education level: Not on file  Occupational History  . Occupation: Retired    Comment: previously worked as a Photographer  . Smoking status: Current Every Day Smoker    Packs/day: 0.50    Years: 48.00    Pack years: 24.00    Types: Cigarettes  . Smokeless tobacco: Never Used  . Tobacco comment: 6 cigarettes per day  Substance and Sexual Activity  . Alcohol use: Yes    Alcohol/week: 3.0 - 4.0 standard drinks    Types: 3 - 4 Shots of liquor per week    Comment: 3-4 shots of liquor on Fridays  . Drug use: No  . Sexual activity: Never    Birth control/protection: None  Other Topics Concern  . Not on file  Social History Narrative   Lives in [place] with daughter. Has 2 On HD since for 20 years. Worked as a Sports coach. Went to 10th grade. Can read and write.    Social Determinants of Health   Financial Resource Strain: Not on file  Food Insecurity: Not on file  Transportation Needs: Not on file  Physical Activity: Not on file  Stress: Not on file  Social  Connections: Not on file   Family History  Problem Relation Age of Onset  . Diabetes Father   . Stroke Mother   . COPD Brother   . Heart attack Brother   . Bronchitis Sister   . Anesthesia problems Neg Hx   . Hypotension Neg Hx   . Malignant hyperthermia Neg Hx   . Pseudochol deficiency Neg Hx    Scheduled Meds: . Chlorhexidine Gluconate Cloth  6 each Topical Q0600  . [START ON 06/20/2020] darbepoetin (ARANESP) injection - DIALYSIS  60 mcg Intravenous Q Tue-HD  . feeding supplement (NEPRO CARB STEADY)  237 mL Oral TID BM  . heparin  5,000 Units Subcutaneous Q8H  . multivitamin  1 tablet Oral QHS  . neomycin-bacitracin-polymyxin  1 application Topical BID  . senna-docusate  1 tablet Oral BID  . vancomycin  500 mg Intravenous Q T,Th,Sa-HD   Continuous Infusions: . sodium chloride 10 mL/hr at 06/14/20 1753  . cefTRIAXone (ROCEPHIN)  IV     PRN Meds:.acetaminophen **OR** acetaminophen, albuterol, bisacodyl, guaiFENesin, guaiFENesin, hydrALAZINE, melatonin, ondansetron **OR** ondansetron (ZOFRAN) IV, oxyCODONE-acetaminophen, polyethylene glycol, traZODone Allergies  Allergen Reactions  . Iodinated Diagnostic Agents Other (See Comments)    Seziure   . Hydrocodone Nausea And Vomiting  . Metoprolol Nausea And Vomiting  . Iohexol Other (See Comments)    Reaction is convulsions   Review of Systems  Musculoskeletal:       R leg and foot pain/twitching    Physical Exam Constitutional:      General: She is not in acute distress. Pulmonary:     Effort: Pulmonary effort is normal.  Musculoskeletal:     Right lower leg: No edema.     Left lower leg: No edema.  Skin:    General: Skin is warm and dry.  Neurological:     Mental Status: She is alert and oriented to person, place, and time.     Vital Signs: BP (!) 162/94 (BP Location: Left Arm)   Pulse 77   Temp 98.4 F (36.9 C) (Oral)   Resp 19   Ht 5' 5"  (1.651 m)   Wt 39.4 kg   SpO2 98%   BMI 14.45 kg/m  Pain Scale:  0-10 POSS *See Group Information*: 1-Acceptable,Awake and alert Pain Score: 3    SpO2: SpO2: 98 % O2 Device:SpO2:  98 % O2 Flow Rate: .   IO: Intake/output summary:   Intake/Output Summary (Last 24 hours) at 06/15/2020 1346 Last data filed at 06/15/2020 1300 Gross per 24 hour  Intake 320 ml  Output 573 ml  Net -253 ml    LBM: Last BM Date: 06/13/20 Baseline Weight: Weight: 45.4 kg Most recent weight: Weight: 39.4 kg     Palliative Assessment/Data: PPS 30%   Time Total: 60 minutes Greater than 50%  of this time was spent counseling and coordinating care related to the above assessment and plan.  Juel Burrow, DNP, AGNP-C Palliative Medicine Team [phone] Pager: [phone]

## 2020-06-15 NOTE — Progress Notes (Signed)
Subjective: Seen on dialysis tolerating UF, having some left foot clonus but improving with dialysis, denies any pain.  Overall feels better than yesterday  Objective Vital signs in last 24 hours: Vitals:   06/15/20 0655 06/15/20 0708 06/15/20 0738 06/15/20 0800  BP: (!) 164/80 129/68 128/65 92/62  Pulse: (!) 111 99 98 99  Resp: 18     Temp: 98.5 F (36.9 C)     TempSrc: Oral     SpO2:      Weight: 40.1 kg     Height:       Weight change: -1.3 kg  Physical Exam: General:Alert pleasant thin elderly female chronically ill-appearing NAD Heart:RRR,2/6stock ejection murmur no rub or gallop Lungs:CTA, nonlabored breathing Abdomen:Bowel sounds positive soft nontender, ND Extremities:Right foot dorsum edemaimproved,bilateral feet dressings dry intact Dialysis Access:RUA AVGGpatent on HD  Dialysis Orders: Center:[facility] KCon TTS. TK:[id number] kgHD Bath 2K, 2.5 CA time Heparin none, RUA AVGG  Mircera 1mg every 2 weeksIlast given 05/29/2020  Problem/Plan: 1. Right footabscess/cellulitis/wound=ABX per admit team, work-up per admit/podiatry I&D 3/30 for the abscess MRI no osteomyelitis 2. Hyperkalemia=secondary to missed hemodialysis HD, a.m. K3.7 3. ESRD -HD TTS,HD now on schedule  4. Hypertension/volume -BP stable, no meds required UF as tolerated in HD, no significant volume overload 5. Anemiaof ESRD-Hgb 8.4>8.1,> 9.0> 8.3 Aranesp dose 60 mcg  8/29 q. Tuesday dialysis 6. Metabolic bone disease -calcium stable,no vitamin D phosphorus 4.4, no phosphate binder needed with decreased p.o. take 7. Chronic arthritic pain on Percocet every 6 hours 8. COPD chronic tobacco use advised patient to quit 9. History of COVID=not in isolation 10. Chronic hepatitis C 11. Nutrition -Albumin 3.4, renal diet,renal vitamin give nephro for protein supplement 12. Goals of care=as per admit patient and family definitely want to continue dialysis now waiting outpatient  palliative care evaluation,"but would not consider hospice" discussed as outpatientat her OP Kid.center unit= Dr. UHollie Salkwas addressing this also, needing palliative  care  input 13. Left leg myoclonic movement= improving with dialysis, monitor, do not think warrants giving medicine for restless leg syndrome  DErnest Haber PA-C [id number]/31/2022,8:31 AM  LOS: 4 days   Labs: Basic Metabolic Panel: Recent Labs  Lab 06/13/20 0310 06/14/20 0723 06/15/20 0411  NA 139 140 140  K 3.7 3.5 3.7  CL 97* 101 101  CO2 '28 26 26  '$ GLUCOSE 94 79 66*  BUN 32* 15 24*  CREATININE 5.52* 3.55* 4.84*  CALCIUM 8.6* 9.4 9.1  PHOS  --   --  4.4   Liver Function Tests: Recent Labs  Lab 06/11/20 1447  AST 29  ALT 8  ALKPHOS 52  BILITOT 1.8*  PROT 8.2*  ALBUMIN 3.4*   No results for input(s): LIPASE, AMYLASE in the last 168 hours. No results for input(s): AMMONIA in the last 168 hours. CBC: Recent Labs  Lab 06/11/20 1447 06/12/20 0315 06/13/20 0310 06/14/20 0723 06/15/20 0411  WBC 7.9 11.1* 7.1 7.0 8.2  NEUTROABS 5.1  --   --  4.1 4.8  HGB 9.5* 8.4* 8.1* 9.0* 8.3*  HCT 30.1* 26.2* 24.5* 27.3* 26.4*  MCV 86.0 83.2 83.6 83.0 85.2  PLT 394 339 377 342 344   Cardiac Enzymes: No results for input(s): CKTOTAL, CKMB, CKMBINDEX, TROPONINI in the last 168 hours. CBG: Recent Labs  Lab 06/11/20 1836 06/12/20 1217 06/14/20 1836  GLUCAP 96 70 65*    Studies/Results: MR FOOT RIGHT WO CONTRAST  Result Date: 06/13/2020 CLINICAL DATA:  Foot swelling, redness,  drainage from the wound EXAM: MRI OF THE RIGHT FOREFOOT WITHOUT CONTRAST TECHNIQUE: Multiplanar, multisequence MR imaging of the right foot was performed. No intravenous contrast was administered. COMPARISON:  None. FINDINGS: Bones/Joint/Cartilage No marrow signal abnormality. No fracture or dislocation. Normal alignment. No joint effusion. Ligaments Collateral ligaments are intact.  Lisfranc ligament  is intact. Muscles and Tendons Flexor, peroneal and extensor compartment tendons are intact. Small amount of fluid in the flexor hallucis longus tendon sheath extending beyond the field of view. Muscles are normal. Soft tissue Complex multiloculated fluid collection along the dorsal aspect of the foot measuring 4.8 x 4.7 x 1.6 cm at the level of the metatarsals. Surrounding soft tissue edema concerning for cellulitis of the dorsal aspect of the foot IMPRESSION: 1. Complex multiloculated fluid collection along the dorsal aspect of the foot at the level of the metatarsals measuring 4.8 x 4.7 x 1.6 cm concerning for an abscess with cellulitis of the dorsal aspect of the foot. 2. No osteomyelitis of the right forefoot. Electronically Signed   By: Kathreen Devoid   On: 06/13/2020 12:24   DG Abd Portable 1V  Result Date: 06/13/2020 CLINICAL DATA:  No bowel movement EXAM: PORTABLE ABDOMEN - 1 VIEW COMPARISON:  06/12/2020 FINDINGS: Surgical hardware in the lumbar spine. Nonobstructed bowel-gas pattern. No substantial stool burden. No radiopaque calculi. IMPRESSION: Nonobstructed bowel-gas pattern. Electronically Signed   By: Donavan Foil M.D.   On: 06/13/2020 19:53   Medications: . sodium chloride 10 mL/hr at 06/14/20 1753  . cefTRIAXone (ROCEPHIN)  IV     . Chlorhexidine Gluconate Cloth  6 each Topical Q0600  . [START ON 06/20/2020] darbepoetin (ARANESP) injection - DIALYSIS  60 mcg Intravenous Q Tue-HD  . feeding supplement (NEPRO CARB STEADY)  237 mL Oral TID BM  . heparin  5,000 Units Subcutaneous Q8H  . multivitamin  1 tablet Oral QHS  . neomycin-bacitracin-polymyxin  1 application Topical BID  . senna-docusate  1 tablet Oral BID  . vancomycin  500 mg Intravenous Q T,Th,Sa-HD

## 2020-06-15 NOTE — Telephone Encounter (Signed)
NP called patient for today's scheduled visit. Nichole reported patient is admitted in the hospital. She agreed to call NP when patient is discharged home.

## 2020-06-15 NOTE — Progress Notes (Signed)
PROGRESS NOTE    MASHELLE MCCOMMONS   [id number]  DOB: [birth year]  DOA: 06/11/2020     4  PCP: Janie Morning, DO  CC: foot pain   Hospital Course: Ms. Barsanti is a 70 yo female with PMH ESRD on HD TTS (misses sessions due to pain), HTN, DMII, arthritis, mild AS, chronic Hep C who presented to the ER with generalized body aches, right foot pain, and her chronic ongoing neck and lower back pain. Work-up was significant for hyperkalemia due to missed dialysis sessions.  She was also noted to have drainage and a foot wound on the dorsal aspect of her right foot.  Family showed a picture from home of the swelling on her foot with purulent drainage.  She was admitted for further work-up/evaluation and IV antibiotics.  Of note, she has been continuously losing weight despite "good appetite" per patient and family. There have also been discussions outpatient regarding palliative care, however no consideration for hospice has been made at this time.  Due to her ongoing decline, family is amenable for meeting with palliative care during this hospitalization for further discussions.  She was evaluated by podiatry after MRI showed underlying abscess.  She underwent I&D on 06/14/2020.  Interval History:  Tolerated surgery last night well.  Energy appears improved when seen this morning.  Was complaining of some twitching in her right lower extremity this morning.  ROS: Constitutional: positive for weight loss, negative for chills and fevers, Respiratory: negative for cough, Cardiovascular: negative for chest pain and Gastrointestinal: positive for constipation, negative for abdominal pain, nausea and vomiting  Assessment & Plan: Right foot cellulitis with abscess -  MRI foot shows multiloculated fluid collection measuring 4.8 x 4.7 x 1.6 cm -Continue vancomycin and Rocephin for now -Bilateral lower extremity duplex negative for DVT performed on 06/11/2020 - appreciate podiatry evaluation;  underwent I&D on 06/14/20; follow up cultures and de-escalate abx if/when able   Hyperkalemia. ESRD on HD TTS Appreciate nephrology assistance. Continue HD per nephrology.  Patient received Lokelma in the ER.  HTN Blood pressure stable.  Anemia of chronic kidney disease Hemoglobin stable.  Monitor.  Chronic hep C Monitor.  Constipation X-ray abdomen Continue bowel regimen  Active smoker Counseled to quit. Monitor.  Goals of care conversation - family open to [facility] discussions; will consult inpatient; was also planning on seeing patient outpatient - patient losing weight despite eating she says; also missing HD due to neck and low back pain (noted to have sacral decub ulcer)  Severe protein calorie malnutrition. - Patient's BMI is Body mass index is 14.45 kg/m.. - Patient has the following signs/symptoms consistent with PCM: (fat loss, muscle loss, muscle wasting, cachexia). - seen by RD, appreciate assistance. Continue plan per RD to include nepro shakes, magic cups  Stage II sacral medial pressure ulcer, POA. Continue foam dressing.  Pain control. - follow up [facility] consult   Old records reviewed in assessment of this patient  Antimicrobials: Rocephin 3/27 >current Vanc 3/27 >> current   DVT prophylaxis: SCDs Start: 06/14/20 1958 heparin injection 5,000 Units Start: 06/11/20 2200   Code Status:   Code Status: Full Code Family Communication: daughter  Disposition Plan: Status is: Inpatient  Remains inpatient appropriate because:Ongoing diagnostic testing needed not appropriate for outpatient work up, IV treatments appropriate due to intensity of illness or inability to take PO and Inpatient level of care appropriate due to severity of illness   Dispo: The patient is from: Home  Anticipated d/c is to: Home              Patient currently is not medically stable to d/c.   Difficult to place patient No  Risk of unplanned readmission score:  Unplanned Admission- Pilot do not use: 22.95   Objective: Blood pressure (!) 162/94, pulse 77, temperature 98.4 F (36.9 C), temperature source Oral, resp. rate 19, height '5\' 5"'$  (1.651 m), weight 39.4 kg, SpO2 98 %.  Examination: General appearance: Frail and cachectic pleasant elderly woman resting in bed in no distress Head: Normocephalic, without obvious abnormality, atraumatic Eyes: EOMI Back: Stage II sacral pressure ulcer noted Lungs: clear to auscultation bilaterally Heart: regular rate and rhythm and S1, S2 normal Abdomen: normal findings: bowel sounds normal and soft, non-tender Extremities: Right lower extremity now wrapped in dressing Skin: mobility and turgor normal Neurologic: Left hand partially contracted.  Left upper extremity flexed and partially contracted  Consultants:   Podiatry  Palliative care medicine  Procedures:     Data Reviewed: I have personally reviewed following labs and imaging studies Results for orders placed or performed during the hospital encounter of 06/11/20 (from the past 24 hour(s))  Aerobic/Anaerobic Culture w Gram Stain (surgical/deep wound)     Status: None (Preliminary result)   Collection Time: 06/14/20  6:12 PM   Specimen: Soft Tissue, Other  Result Value Ref Range   Specimen Description ABSCESS FOOT RIGHT    Special Requests NONE    Gram Stain      FEW WBC PRESENT, PREDOMINANTLY PMN NO ORGANISMS SEEN    Culture      NO GROWTH < 12 HOURS Performed at [facility], [address] [place], [place] [address]    Report Status PENDING   Glucose, capillary     Status: Abnormal   Collection Time: 06/14/20  6:36 PM  Result Value Ref Range   Glucose-Capillary 65 (L) 70 - 99 mg/dL  Magnesium     Status: None   Collection Time: 06/15/20  4:11 AM  Result Value Ref Range   Magnesium 2.2 1.7 - 2.4 mg/dL  CBC with Differential/Platelet     Status: Abnormal   Collection Time: 06/15/20  4:11 AM  Result Value Ref Range   WBC  8.2 4.0 - 10.5 K/uL   RBC 3.10 (L) 3.87 - 5.11 MIL/uL   Hemoglobin 8.3 (L) 12.0 - 15.0 g/dL   HCT 26.4 (L) 36.0 - 46.0 %   MCV 85.2 80.0 - 100.0 fL   MCH 26.8 26.0 - 34.0 pg   MCHC 31.4 30.0 - 36.0 g/dL   RDW 17.4 (H) 11.5 - 15.5 %   Platelets 344 150 - 400 K/uL   nRBC 0.0 0.0 - 0.2 %   Neutrophils Relative % 59 %   Neutro Abs 4.8 1.7 - 7.7 K/uL   Lymphocytes Relative 21 %   Lymphs Abs 1.7 0.7 - 4.0 K/uL   Monocytes Relative 14 %   Monocytes Absolute 1.2 (H) 0.1 - 1.0 K/uL   Eosinophils Relative 4 %   Eosinophils Absolute 0.3 0.0 - 0.5 K/uL   Basophils Relative 1 %   Basophils Absolute 0.1 0.0 - 0.1 K/uL   Immature Granulocytes 1 %   Abs Immature Granulocytes 0.05 0.00 - 0.07 K/uL  Basic metabolic panel     Status: Abnormal   Collection Time: 06/15/20  4:11 AM  Result Value Ref Range   Sodium 140 135 - 145 mmol/L   Potassium 3.7 3.5 - 5.1  mmol/L   Chloride 101 98 - 111 mmol/L   CO2 26 22 - 32 mmol/L   Glucose, Bld 66 (L) 70 - 99 mg/dL   BUN 24 (H) 8 - 23 mg/dL   Creatinine, Ser 4.84 (H) 0.44 - 1.00 mg/dL   Calcium 9.1 8.9 - 10.3 mg/dL   GFR, Estimated 9 (L) >60 mL/min   Anion gap 13 5 - 15  Phosphorus     Status: None   Collection Time: 06/15/20  4:11 AM  Result Value Ref Range   Phosphorus 4.4 2.5 - 4.6 mg/dL    Recent Results (from the past 240 hour(s))  Blood Culture (routine x 2)     Status: None (Preliminary result)   Collection Time: 06/11/20  2:47 PM   Specimen: BLOOD LEFT FOREARM  Result Value Ref Range Status   Specimen Description   Final    BLOOD LEFT FOREARM Performed at [facility] [address] [place], [place] [address]    Special Requests   Final    BOTTLES DRAWN AEROBIC AND ANAEROBIC Blood Culture adequate volume Performed at [facility] [address], [place], [place] [address]    Culture   Final    NO GROWTH 4 DAYS Performed at [facility], [place] [address], [place], [place] [address]     Report Status PENDING  Incomplete  Blood Culture (routine x 2)     Status: None (Preliminary result)   Collection Time: 06/11/20  8:45 PM   Specimen: BLOOD LEFT HAND  Result Value Ref Range Status   Specimen Description BLOOD LEFT HAND  Final   Special Requests   Final    BOTTLES DRAWN AEROBIC ONLY Blood Culture results may not be optimal due to an inadequate volume of blood received in culture bottles   Culture   Final    NO GROWTH 4 DAYS Performed at [facility], [place] [address], [place],  [address]    Report Status PENDING  Incomplete  Aerobic/Anaerobic Culture w Gram Stain (surgical/deep wound)     Status: None (Preliminary result)   Collection Time: 06/14/20  6:12 PM   Specimen: Soft Tissue, Other  Result Value Ref Range Status   Specimen Description ABSCESS FOOT RIGHT  Final   Special Requests NONE  Final   Gram Stain   Final    FEW WBC PRESENT, PREDOMINANTLY PMN NO ORGANISMS SEEN    Culture   Final    NO GROWTH < 12 HOURS Performed at [facility], [place] [address], [place],  [address]    Report Status PENDING  Incomplete     Radiology Studies: DG Abd Portable 1V  Result Date: 06/13/2020 CLINICAL DATA:  No bowel movement EXAM: PORTABLE ABDOMEN - 1 VIEW COMPARISON:  06/12/2020 FINDINGS: Surgical hardware in the lumbar spine. Nonobstructed bowel-gas pattern. No substantial stool burden. No radiopaque calculi. IMPRESSION: Nonobstructed bowel-gas pattern. Electronically Signed   By: Donavan Foil M.D.   On: 06/13/2020 19:53   DG Abd Portable 1V  Final Result    MR FOOT RIGHT WO CONTRAST  Final Result    DG Abd Portable 1V  Final Result    DG Pelvis Portable  Final Result    DG CHEST PORT 1 VIEW  Final Result    VAS [place] LOWER EXTREMITY VENOUS (DVT) (ONLY [facility] & [facility] 7a-7p)  Final Result    DG Foot Complete Right  Final Result      Scheduled  Meds: . Chlorhexidine Gluconate Cloth  6 each Topical Q0600  . [START ON 06/20/2020] darbepoetin  (ARANESP) injection - DIALYSIS  60 mcg Intravenous Q Tue-HD  . feeding supplement (NEPRO CARB STEADY)  237 mL Oral TID BM  . heparin  5,000 Units Subcutaneous Q8H  . multivitamin  1 tablet Oral QHS  . neomycin-bacitracin-polymyxin  1 application Topical BID  . senna-docusate  1 tablet Oral BID  . vancomycin  500 mg Intravenous Q T,Th,Sa-HD   PRN Meds: acetaminophen **OR** acetaminophen, albuterol, bisacodyl, guaiFENesin, guaiFENesin, hydrALAZINE, melatonin, ondansetron **OR** ondansetron (ZOFRAN) IV, oxyCODONE-acetaminophen, polyethylene glycol, traZODone Continuous Infusions: . sodium chloride 10 mL/hr at 06/14/20 1753  . cefTRIAXone (ROCEPHIN)  IV       LOS: 4 days  Time spent: Greater than 50% of the 35 minute visit was spent in counseling/coordination of care for the patient as laid out in the A&P.   Dwyane Dee, MD [facility] 06/15/2020, 1:30 PM

## 2020-06-16 ENCOUNTER — Encounter (HOSPITAL_COMMUNITY): Payer: Self-pay | Admitting: Podiatry

## 2020-06-16 DIAGNOSIS — Z992 Dependence on renal dialysis: Secondary | ICD-10-CM | POA: Diagnosis not present

## 2020-06-16 DIAGNOSIS — N186 End stage renal disease: Secondary | ICD-10-CM | POA: Diagnosis not present

## 2020-06-16 DIAGNOSIS — I120 Hypertensive chronic kidney disease with stage 5 chronic kidney disease or end stage renal disease: Secondary | ICD-10-CM | POA: Diagnosis not present

## 2020-06-16 LAB — CBC WITH DIFFERENTIAL/PLATELET
Abs Immature Granulocytes: 0.05 10*3/uL (ref 0.00–0.07)
Basophils Absolute: 0.1 10*3/uL (ref 0.0–0.1)
Basophils Relative: 1 %
Eosinophils Absolute: 0.2 10*3/uL (ref 0.0–0.5)
Eosinophils Relative: 2 %
HCT: 26.4 % — ABNORMAL LOW (ref 36.0–46.0)
Hemoglobin: 8.3 g/dL — ABNORMAL LOW (ref 12.0–15.0)
Immature Granulocytes: 1 %
Lymphocytes Relative: 16 %
Lymphs Abs: 1.5 10*3/uL (ref 0.7–4.0)
MCH: 27.1 pg (ref 26.0–34.0)
MCHC: 31.4 g/dL (ref 30.0–36.0)
MCV: 86.3 fL (ref 80.0–100.0)
Monocytes Absolute: 1.1 10*3/uL — ABNORMAL HIGH (ref 0.1–1.0)
Monocytes Relative: 11 %
Neutro Abs: 6.7 10*3/uL (ref 1.7–7.7)
Neutrophils Relative %: 69 %
Platelets: 258 10*3/uL (ref 150–400)
RBC: 3.06 MIL/uL — ABNORMAL LOW (ref 3.87–5.11)
RDW: 17.2 % — ABNORMAL HIGH (ref 11.5–15.5)
WBC: 9.6 10*3/uL (ref 4.0–10.5)
nRBC: 0 % (ref 0.0–0.2)

## 2020-06-16 LAB — PHOSPHORUS: Phosphorus: 2.4 mg/dL — ABNORMAL LOW (ref 2.5–4.6)

## 2020-06-16 LAB — CULTURE, BLOOD (ROUTINE X 2)
Culture: NO GROWTH
Culture: NO GROWTH
Special Requests: ADEQUATE

## 2020-06-16 LAB — BASIC METABOLIC PANEL
Anion gap: 14 (ref 5–15)
BUN: 17 mg/dL (ref 8–23)
CO2: 25 mmol/L (ref 22–32)
Calcium: 8.9 mg/dL (ref 8.9–10.3)
Chloride: 98 mmol/L (ref 98–111)
Creatinine, Ser: 3.06 mg/dL — ABNORMAL HIGH (ref 0.44–1.00)
GFR, Estimated: 16 mL/min — ABNORMAL LOW (ref >60)
Glucose, Bld: 87 mg/dL (ref 70–99)
Potassium: 3.5 mmol/L (ref 3.5–5.1)
Sodium: 137 mmol/L (ref 135–145)

## 2020-06-16 LAB — MAGNESIUM: Magnesium: 2.1 mg/dL (ref 1.7–2.4)

## 2020-06-16 MED ORDER — CHLORHEXIDINE GLUCONATE CLOTH 2 % EX PADS
6.0000 | MEDICATED_PAD | Freq: Every day | CUTANEOUS | Status: DC
  Administered 2020-06-16 – 2020-06-17 (×2): 6 via TOPICAL

## 2020-06-16 MED ORDER — DOXYCYCLINE HYCLATE 100 MG PO TABS
100.0000 mg | ORAL_TABLET | Freq: Two times a day (BID) | ORAL | Status: DC
  Administered 2020-06-16 – 2020-06-17 (×4): 100 mg via ORAL
  Filled 2020-06-16 (×4): qty 1

## 2020-06-16 NOTE — Progress Notes (Signed)
[facility] Progress Note   Subjective:   Pt seen in room this AM. Reports feeling well, minimal foot pain. Denies CP, palpitations, abdominal pain, N/V/D. Noted palliative care planning for family meeting this AM. Pt does report that her oldest daughter was diagnosed with the flu yesterday.   Objective Vitals:   06/15/20 1640 06/15/20 2004 06/16/20 0533 06/16/20 0823  BP: (!) 154/88 135/65 (!) 169/76 (!) 122/57  Pulse: 78 (!) 102 (!) 102 98  Resp: '18 18 20 18  '$ Temp: 98.2 F (36.8 C) 98.3 F (36.8 C) 98.4 F (36.9 C) 98.6 F (37 C)  TempSrc: Oral Oral Oral Oral  SpO2: 99% 94% 98% 96%  Weight:      Height:       Physical Exam General: WDWN elderly female, alert and in NAD Heart: RRR, 3/6 systolic murmur Lungs: CTA bilaterally without wheezing, rhonchi or rales. Unlabored on RA Abdomen: Soft, non-tender, non-distended, +BS Extremities: No edema b/l lower extremities Dialysis Access:  RUE AVG + bruit  Additional Objective Labs: Basic Metabolic Panel: Recent Labs  Lab 06/14/20 0723 06/15/20 0411 06/16/20 0341  NA 140 140 137  K 3.5 3.7 3.5  CL 101 101 98  CO2 '26 26 25  '$ GLUCOSE 79 66* 87  BUN 15 24* 17  CREATININE 3.55* 4.84* 3.06*  CALCIUM 9.4 9.1 8.9  PHOS  --  4.4 2.4*   Liver Function Tests: Recent Labs  Lab 06/11/20 1447  AST 29  ALT 8  ALKPHOS 52  BILITOT 1.8*  PROT 8.2*  ALBUMIN 3.4*   No results for input(s): LIPASE, AMYLASE in the last 168 hours. CBC: Recent Labs  Lab 06/12/20 0315 06/13/20 0310 06/14/20 0723 06/15/20 0411 06/16/20 0341  WBC 11.1* 7.1 7.0 8.2 9.6  NEUTROABS  --   --  4.1 4.8 6.7  HGB 8.4* 8.1* 9.0* 8.3* 8.3*  HCT 26.2* 24.5* 27.3* 26.4* 26.4*  MCV 83.2 83.6 83.0 85.2 86.3  PLT 339 377 342 344 258   Blood Culture    Component Value Date/Time   SDES ABSCESS FOOT RIGHT 06/14/2020 1812   SPECREQUEST NONE 06/14/2020 1812   CULT  06/14/2020 1812    NO GROWTH < 12 HOURS Performed at [facility] [address] [place], [place] [address]    REPTSTATUS PENDING 06/14/2020 1812   CBG: Recent Labs  Lab 06/11/20 1836 06/12/20 1217 06/14/20 1836  GLUCAP 96 70 65*   Medications: . sodium chloride 10 mL/hr at 06/14/20 1753   . Chlorhexidine Gluconate Cloth  6 each Topical Q0600  . [START ON 06/20/2020] darbepoetin (ARANESP) injection - DIALYSIS  60 mcg Intravenous Q Tue-HD  . doxycycline  100 mg Oral Q12H  . feeding supplement (NEPRO CARB STEADY)  237 mL Oral TID BM  . heparin  5,000 Units Subcutaneous Q8H  . multivitamin  1 tablet Oral QHS  . neomycin-bacitracin-polymyxin  1 application Topical BID  . senna-docusate  1 tablet Oral BID    Dialysis Orders: Center:[facility]. [facility]:[id number] kgHD Bath 2K, 2.5 CA time Heparin none, RUA AVGG  Mircera 12mg every 2 weeksIlast given 05/29/2020  Assessment/Plan: 1. Right footabscess/cellulitis/wound-ABX per admit team, work-up per admit/podiatry I&D 3/30 for the abscess MRI no osteomyelitis 2. Hyperkalemia- secondary to missed hemodialysis, now resolved 3. ESRD -Continue HD on TTS schedule 4. Hypertension/volume -BP stable, euvolemic on exam. Below outpatietn EDW with ongoing weight loss noted. Continue UF with HD as tolerated 5. Anemiaof ESRD-Hgb trending down, aranesp 652m given  on Thursday 6. Metabolic bone disease -calcium stable,phos slightly low, not on phosphorus binder. On regular diet 7. Chronic arthritic pain: on Percocet every 4 hours 8. COPD chronic tobacco use advised patient to quit 9. Nutrition -Albumin 3.4, receiving renal vitamin and supplement. Noted weight loss lately, see palliative care notes 10. Goals of care-palliative care addressing today 11. Left leg myoclonic movement- improving with dialysis, monitor, do not think warrants giving medicine for restless leg syndrome   Anice Paganini, PA-C 06/16/2020, 8:49 AM  [facility] Pager: [phone]

## 2020-06-16 NOTE — Progress Notes (Signed)
PROGRESS NOTE    [name]   [id number]  DOB: February 27, 1951  DOA: 06/11/2020     5  PCP: Janie Morning, DO  CC: foot pain   Hospital Course: [name] is a 70 yo female with PMH ESRD on HD TTS (misses sessions due to pain), HTN, DMII, arthritis, mild AS, chronic Hep C who presented to the ER with generalized body aches, right foot pain, and her chronic ongoing neck and lower back pain. Work-up was significant for hyperkalemia due to missed dialysis sessions.  She was also noted to have drainage and a foot wound on the dorsal aspect of her right foot.  Family showed a picture from home of the swelling on her foot with purulent drainage.  She was admitted for further work-up/evaluation and IV antibiotics.  Of note, she has been continuously losing weight despite "good appetite" per patient and family. There have also been discussions outpatient regarding palliative care, however no consideration for hospice has been made at this time.  Due to her ongoing decline, family is amenable for meeting with palliative care during this hospitalization for further discussions.  She was evaluated by podiatry after MRI showed underlying abscess.  She underwent I&D on 06/14/2020.  Interval History:  No events overnight.  Seems to be more comfortable when seen this morning after pain regimen adjustment yesterday.  Family meeting was held with palliative care today.  Patient has elected to continue being full code and a MOST form was filled out.  She does agree for ongoing outpatient palliative care and wishes to continue with dialysis and essentially full scope of treatment.  ROS: Constitutional: positive for weight loss, negative for chills and fevers, Respiratory: negative for cough, Cardiovascular: negative for chest pain and Gastrointestinal: positive for constipation, negative for abdominal pain, nausea and vomiting  Assessment & Plan: Right foot cellulitis with abscess -  MRI foot shows  multiloculated fluid collection measuring 4.8 x 4.7 x 1.6 cm  -Discontinue vancomycin and Rocephin; de-escalate down to doxycycline to complete course; cultures remain negative -Bilateral lower extremity duplex negative for DVT performed on 06/11/2020 - appreciate podiatry evaluation; underwent I&D on 06/14/20  Hyperkalemia. ESRD on HD TTS Appreciate nephrology assistance. Continue HD per nephrology.  Patient received Lokelma in the ER.  HTN Blood pressure stable.  Anemia of chronic kidney disease Hemoglobin stable.  Monitor.  Chronic hep C Monitor.  Constipation X-ray abdomen Continue bowel regimen  Active smoker Counseled to quit. Monitor.  Goals of care conversation -Palliative care meeting with family on 06/16/2020.  Patient remains full code with ongoing full scope of treatment and ongoing dialysis. -Patient does elect for continuing with outpatient palliative care  Severe protein calorie malnutrition. - Patient's BMI is Body mass index is 14.45 kg/m.. - Patient has the following signs/symptoms consistent with PCM: (fat loss, muscle loss, muscle wasting, cachexia). - seen by RD, appreciate assistance. Continue plan per RD to include nepro shakes, magic cups  Stage II sacral medial pressure ulcer, POA. Continue foam dressing.  Pain control. - follow up [facility] consult   Old records reviewed in assessment of this patient  Antimicrobials: Rocephin 3/27 > 06/16/2020 Vanc 3/27 >> 06/16/2020 Doxycycline 06/16/20  >>  DVT prophylaxis: SCDs Start: 06/14/20 1958 heparin injection 5,000 Units Start: 06/11/20 2200   Code Status:   Code Status: Full Code Family Communication: daughter  Disposition Plan: Status is: Inpatient  Remains inpatient appropriate because:Ongoing diagnostic testing needed not appropriate for outpatient work up, IV treatments appropriate due  to intensity of illness or inability to take PO and Inpatient level of care appropriate due to severity of  illness   Dispo: The patient is from: Home              Anticipated d/c is to: Home              Patient currently is not medically stable to d/c.   Difficult to place patient No  Risk of unplanned readmission score: Unplanned Admission- Pilot do not use: 20.8   Objective: Blood pressure (!) 122/57, pulse 98, temperature 98.6 F (37 C), temperature source Oral, resp. rate 18, height '5\' 5"'$  (1.651 m), weight 39.4 kg, SpO2 96 %.  Examination: General appearance: Frail and cachectic pleasant elderly woman resting in bed in no distress Head: Normocephalic, without obvious abnormality, atraumatic Eyes: EOMI Back: Stage II sacral pressure ulcer noted Lungs: clear to auscultation bilaterally Heart: regular rate and rhythm and S1, S2 normal Abdomen: normal findings: bowel sounds normal and soft, non-tender Extremities: Right lower extremity now wrapped in dressing Skin: mobility and turgor normal Neurologic: Left hand partially contracted.  Left upper extremity flexed and partially contracted  Consultants:   Podiatry  Palliative care medicine  Procedures:     Data Reviewed: I have personally reviewed following labs and imaging studies Results for orders placed or performed during the hospital encounter of 06/11/20 (from the past 24 hour(s))  Magnesium     Status: None   Collection Time: 06/16/20  3:41 AM  Result Value Ref Range   Magnesium 2.1 1.7 - 2.4 mg/dL  CBC with Differential/Platelet     Status: Abnormal   Collection Time: 06/16/20  3:41 AM  Result Value Ref Range   WBC 9.6 4.0 - 10.5 K/uL   RBC 3.06 (L) 3.87 - 5.11 MIL/uL   Hemoglobin 8.3 (L) 12.0 - 15.0 g/dL   HCT 26.4 (L) 36.0 - 46.0 %   MCV 86.3 80.0 - 100.0 fL   MCH 27.1 26.0 - 34.0 pg   MCHC 31.4 30.0 - 36.0 g/dL   RDW 17.2 (H) 11.5 - 15.5 %   Platelets 258 150 - 400 K/uL   nRBC 0.0 0.0 - 0.2 %   Neutrophils Relative % 69 %   Neutro Abs 6.7 1.7 - 7.7 K/uL   Lymphocytes Relative 16 %   Lymphs Abs 1.5 0.7 -  4.0 K/uL   Monocytes Relative 11 %   Monocytes Absolute 1.1 (H) 0.1 - 1.0 K/uL   Eosinophils Relative 2 %   Eosinophils Absolute 0.2 0.0 - 0.5 K/uL   Basophils Relative 1 %   Basophils Absolute 0.1 0.0 - 0.1 K/uL   Immature Granulocytes 1 %   Abs Immature Granulocytes 0.05 0.00 - 0.07 K/uL  Basic metabolic panel     Status: Abnormal   Collection Time: 06/16/20  3:41 AM  Result Value Ref Range   Sodium 137 135 - 145 mmol/L   Potassium 3.5 3.5 - 5.1 mmol/L   Chloride 98 98 - 111 mmol/L   CO2 25 22 - 32 mmol/L   Glucose, Bld 87 70 - 99 mg/dL   BUN 17 8 - 23 mg/dL   Creatinine, Ser 3.06 (H) 0.44 - 1.00 mg/dL   Calcium 8.9 8.9 - 10.3 mg/dL   GFR, Estimated 16 (L) >60 mL/min   Anion gap 14 5 - 15  Phosphorus     Status: Abnormal   Collection Time: 06/16/20  3:41 AM  Result Value Ref Range  Phosphorus 2.4 (L) 2.5 - 4.6 mg/dL    Recent Results (from the past 240 hour(s))  Blood Culture (routine x 2)     Status: None (Preliminary result)   Collection Time: 06/11/20  2:47 PM   Specimen: BLOOD LEFT FOREARM  Result Value Ref Range Status   Specimen Description   Final    BLOOD LEFT FOREARM Performed at [facility] [address] [place], [place] [address]    Special Requests   Final    BOTTLES DRAWN AEROBIC AND ANAEROBIC Blood Culture adequate volume Performed at [facility] [address] [place], [place] [address]    Culture   Final    NO GROWTH 4 DAYS Performed at [facility], [place] [address], [place], [place] [address]    Report Status PENDING  Incomplete  Blood Culture (routine x 2)     Status: None (Preliminary result)   Collection Time: 06/11/20  8:45 PM   Specimen: BLOOD LEFT HAND  Result Value Ref Range Status   Specimen Description BLOOD LEFT HAND  Final   Special Requests   Final    BOTTLES DRAWN AEROBIC ONLY Blood Culture results may not be optimal due to an inadequate volume of blood received in culture bottles    Culture   Final    NO GROWTH 4 DAYS Performed at [facility], [place] [address] [place], [name] [address]    Report Status PENDING  Incomplete  Aerobic/Anaerobic Culture w Gram Stain (surgical/deep wound)     Status: None (Preliminary result)   Collection Time: 06/14/20  6:12 PM   Specimen: Soft Tissue, Other  Result Value Ref Range Status   Specimen Description ABSCESS FOOT RIGHT  Final   Special Requests NONE  Final   Gram Stain   Final    FEW WBC PRESENT, PREDOMINANTLY PMN NO ORGANISMS SEEN    Culture   Final    NO GROWTH 2 DAYS Performed at [facility], [place] [address], [place],  [address]    Report Status PENDING  Incomplete     Radiology Studies: No results found. DG Abd Portable 1V  Final Result    MR FOOT RIGHT WO CONTRAST  Final Result    DG Abd Portable 1V  Final Result    DG Pelvis Portable  Final Result    DG CHEST PORT 1 VIEW  Final Result    VAS [place] LOWER EXTREMITY VENOUS (DVT) (ONLY [facility] & [facility] 7a-7p)  Final Result    DG Foot Complete Right  Final Result      Scheduled Meds: . Chlorhexidine Gluconate Cloth  6 each Topical Q0600  . Chlorhexidine Gluconate Cloth  6 each Topical Q0600  . [START ON 06/20/2020] darbepoetin (ARANESP) injection - DIALYSIS  60 mcg Intravenous Q Tue-HD  . doxycycline  100 mg Oral Q12H  . feeding supplement (NEPRO CARB STEADY)  237 mL Oral TID BM  . heparin  5,000 Units Subcutaneous Q8H  . multivitamin  1 tablet Oral QHS  . neomycin-bacitracin-polymyxin  1 application Topical BID  . senna-docusate  1 tablet Oral BID   PRN Meds: acetaminophen **OR** acetaminophen, albuterol, bisacodyl, guaiFENesin, guaiFENesin, hydrALAZINE, melatonin, ondansetron **OR** ondansetron (ZOFRAN) IV, oxyCODONE-acetaminophen, polyethylene glycol, traZODone Continuous Infusions: . sodium chloride 10 mL/hr at 06/14/20 1753     LOS: 5 days  Time spent: Greater than 50% of the 35 minute visit was spent in counseling/coordination of  care for the patient as  laid out in the A&P.   Dwyane Dee, MD [facility] 06/16/2020, 2:05 PM

## 2020-06-16 NOTE — Progress Notes (Signed)
Daily Progress Note   Patient Name: [name]       Date: 06/16/2020 DOB: Jul 26, 1950  Age: 70 y.o. MRN#: [id number] Attending Physician: Dwyane Dee, MD Primary Care Physician: Janie Morning, DO Admit Date: 06/11/2020  Reason for Consultation/Follow-up: Establishing goals of care  Subjective: Patient reports pain better controlled today, overall feeling better today than yesterday  Length of Stay: 5  Current Medications: Scheduled Meds:  . Chlorhexidine Gluconate Cloth  6 each Topical Q0600  . Chlorhexidine Gluconate Cloth  6 each Topical Q0600  . [START ON 06/20/2020] darbepoetin (ARANESP) injection - DIALYSIS  60 mcg Intravenous Q Tue-HD  . doxycycline  100 mg Oral Q12H  . feeding supplement (NEPRO CARB STEADY)  237 mL Oral TID BM  . heparin  5,000 Units Subcutaneous Q8H  . multivitamin  1 tablet Oral QHS  . neomycin-bacitracin-polymyxin  1 application Topical BID  . senna-docusate  1 tablet Oral BID    Continuous Infusions: . sodium chloride 10 mL/hr at 06/14/20 1753    PRN Meds: acetaminophen **OR** acetaminophen, albuterol, bisacodyl, guaiFENesin, guaiFENesin, hydrALAZINE, melatonin, ondansetron **OR** ondansetron (ZOFRAN) IV, oxyCODONE-acetaminophen, polyethylene glycol, traZODone  Physical Exam Constitutional:      General: She is not in acute distress. Pulmonary:     Effort: Pulmonary effort is normal.  Musculoskeletal:     Comments: RLE in clean dry bandage  Skin:    General: Skin is warm and dry.  Neurological:     Mental Status: She is alert and oriented to person, place, and time.  Psychiatric:        Mood and Affect: Mood normal.        Behavior: Behavior normal.             Vital Signs: BP (!) 122/57 (BP Location: Left Arm)   Pulse 98   Temp 98.6 F (37 C)  (Oral)   Resp 18   Ht 5' 5"  (1.651 m)   Wt 39.4 kg   SpO2 96%   BMI 14.45 kg/m  SpO2: SpO2: 96 % O2 Device: O2 Device: Room Air O2 Flow Rate:    Intake/output summary:   Intake/Output Summary (Last 24 hours) at 06/16/2020 1133 Last data filed at 06/16/2020 0730 Gross per 24 hour  Intake 977.3 ml  Output 0 ml  Net 977.3 ml   LBM: Last BM Date: 06/13/20 Baseline Weight: Weight: 45.4 kg Most recent weight: Weight: 39.4 kg       Palliative Assessment/Data: PPS 30%    Flowsheet Rows   Flowsheet Row Most Recent Value  Intake Tab   Referral Department Hospitalist  Unit at Time of Referral Med/Surg Unit  Palliative Care Primary Diagnosis Nephrology  Date Notified 06/13/20  Palliative Care Type New Palliative care  Reason for referral Clarify Goals of Care  Date of Admission 06/11/20  Date first seen by Palliative Care 06/15/20  # of days Palliative referral response time 2 Day(s)  # of days IP prior to Palliative referral 2  Clinical Assessment   Palliative Performance Scale Score 30%  Psychosocial & Spiritual Assessment   Palliative Care Outcomes   Patient/Family meeting held? Yes  Who was at the meeting? patient, 2 daughters  Palliative  Care Outcomes Improved pain interventions, Clarified goals of care, Counseled regarding hospice, Provided advance care planning, Provided psychosocial or spiritual support, Linked to palliative care logitudinal support      Patient Active Problem List   Diagnosis Date Noted  . Abscess of right foot 06/13/2020  . Protein-calorie malnutrition, severe 06/12/2020  . Wound cellulitis 06/11/2020  . Cough   . Hyperkalemia 05/25/2020  . Anemia due to stage 5 chronic kidney disease ([place]) 05/25/2020  . COVID-19 virus detected 05/25/2020  . Pressure injury of skin 05/25/2020  . Pain due to onychomycosis of toenails of both feet 08/11/2019  . Acute metabolic encephalopathy   . Pneumonia 02/25/2019  . Uremia 02/25/2019  . HCAP  (healthcare-associated pneumonia) 02/23/2019  . Complication of vascular access for dialysis 01/17/2019  . Rheumatoid arthritis ([place]) 11/16/2018  . Complication of vascular dialysis catheter 04/15/2016  . At high risk for falls 05/19/2015  . Bilateral foot-drop 05/19/2015  . History of spontaneous subarachnoid intracranial hemorrhage due to cerebral aneurysm 05/19/2015  . Tobacco dependence 05/19/2015  . Underimmunization status 05/06/2015  . Headache 12/08/2013  . Fever, unspecified 12/08/2013  . Pruritus, unspecified 12/08/2013  . Diarrhea, unspecified 12/02/2013  . Pain, unspecified 12/02/2013  . Coagulation defect, unspecified ([place]) 11/29/2013  . Muscle weakness (generalized) 05/15/2012  . Abnormality of gait 05/15/2012  . Other acquired deformity of ankle and foot(736.79) 05/15/2012  . Iron deficiency anemia, unspecified 02/17/2012  . Lumbar radiculitis 01/17/2012  . Post herpetic neuralgia 11/25/2011  . Leg pain, right 10/14/2011  . Valtrex overdose 10/01/2011  . Shingles rash 09/30/2011  . Body mass index (BMI) less than or equal to 19 in adult 07/10/2011  . Unspecified protein-calorie malnutrition ([place]) 06/12/2011  . Preop cardiovascular exam 05/28/2011  . Sinus tachycardia 04/23/2011  . Aortic stenosis 04/23/2011  . Ileostomy in place [facility]) 04/22/2011  . Other specified cardiac arrhythmias 04/04/2011  . Other acquired deformities of unspecified foot 03/22/2011  . Anemia of chronic kidney failure 02/09/2011  . Acute ischemic colitis ([place]) 02/09/2011  . Neuralgia and neuritis, unspecified 02/01/2011  . Lumbar stenosis with neurogenic claudication 01/30/2011  . Spondylolisthesis of lumbar region 01/30/2011  . ESRD (end stage renal disease) ([place]) 01/30/2011  . Hypertension 01/30/2011  . Unspecified viral hepatitis C without hepatic coma 01/30/2011  . Hepatitis C antibody test positive 01/30/2011  . Alcohol abuse, uncomplicated 40/81/4481  . Awaiting organ transplant  status 03/17/2009  . Benign neoplasm of unspecified kidney 03/17/2009  . Disorder of phosphorus metabolism, unspecified 03/17/2009  . Hypercalcemia 03/17/2009  . Other long term (current) drug therapy 03/17/2009  . Personal history of pneumonia (recurrent) 03/17/2009  . Patient's noncompliance with other medical treatment and regimen 03/17/2009  . Polycystic kidney, adult type 03/17/2009  . Acquired absence of kidney 06/21/2008  . Disorder of kidney and ureter, unspecified 06/14/2008  . Other abnormal glucose 05/06/2006  . Type 2 diabetes mellitus without complications ([place]) 85/63/1497  . Secondary hyperparathyroidism of renal origin ([place]) 12/17/2002  . Postprocedural hypoparathyroidism ([place]) 01/16/2001  . Contact with and (suspected) exposure to potentially hazardous body fluids 03/17/2000  . Other intervertebral disc degeneration, lumbosacral region 09/06/1999  . Allergy status to other drugs, medicaments and biological substances status 02/03/1998  . Hypertensive chronic kidney disease with stage 5 chronic kidney disease or end stage renal disease ([place]) 02/03/1998  . Nonrheumatic mitral (valve) insufficiency 02/03/1998  . Dependence on renal dialysis [facility]) 03/18/1992    Palliative Care Assessment & Plan   HPI: 70 y.o. female  with past medical history of ESRD on HD TTS (misses sessions due to pain), HTN, DMII, arthritis, mild AS, chronic Hep C  admitted on 06/11/2020 with generalized body aches, right foot pain, and her chronic ongoing neck and lower back pain. She was also noted to have drainage and a foot wound on the dorsal aspect of her right foot - underwent I&D on 06/14/20.Patient also with ongoing weight loss despite "good" appetite; BMI 14. PMT consulted to discuss [place].  Assessment: I have reviewed medical records including EPIC notes, labs and imaging, assessed the patient and then met with patient and daughter Mateo Flow with other daughter Elmyra Ricks on speaker phone to discuss  diagnosis prognosis, [place], EOL wishes, disposition and options.  I introduced Palliative Medicine as specialized medical care for people living with serious illness. It focuses on providing relief from the symptoms and stress of a serious illness. The goal is to improve quality of life for both the patient and the family.  We reviewed patient's baseline status - spends her time at home on the couch - sitting or lying but cannot tolerate much time supine d/t shortness of breath - sleeps sitting up. Unable to ambulate. Can bear weight on legs for short time to stand and pivot with assistance. Appetite okay but weight loss continues. No problems with cognition.    We discussed patient's current illness and what it means in the larger context of patient's on-going co-morbidities.  Natural disease trajectory and expectations at EOL were discussed. We reviewed that patient has voiced at times she is uncertain if she wants to continue HD. Family voices support of whatever patient wants to do. We discuss that time would be limited whenever she decides to stop HD - likely <2 weeks. She expresses understanding. She tells me she knows she has the option to stop whenever she is ready or her body is no longer tolerating; but for now she would like to continue.   Advance directives, concepts specific to code status, artificial feeding and hydration, and rehospitalization were considered and discussed.   I completed a MOST form today. The patient and family outlined their wishes for the following treatment decisions:  Cardiopulmonary Resuscitation: Attempt Resuscitation (CPR)  Medical Interventions: Full Scope of Treatment: Use intubation, advanced airway interventions, mechanical ventilation, cardioversion as indicated, medical treatment, IV fluids, etc, also provide comfort measures. Transfer to the hospital if indicated  Antibiotics: Antibiotics if indicated  IV Fluids: IV fluids if indicated  Feeding Tube:  Feeding tube for a defined trial period   We did review code status again. Encouraged patient/family to consider DNR/DNI status understanding evidenced based poor outcomes in similar hospitalized patients, as the cause of the arrest is likely associated with chronic/terminal disease rather than a reversible acute cardio-pulmonary event. She expresses understanding but would like to maintain full code for now. We discuss this can be readdressed as we continue through her illness. She is agreeable.   Discussed with patient/family the importance of continued conversation with family and the medical providers regarding overall plan of care and treatment options, ensuring decisions are within the context of the patient's values and GOCs.    Palliative Care services outpatient were explained and offered. She is agreeable to continuing outpatient palliative.   Questions and concerns were addressed. The family was encouraged to call with questions or concerns.   Recommendations/Plan:  Full code/full scope following family meeting, continue HD  Pain better controlled with q4hr percocet PRN  MOST documented as above, placed in  epic  Palliative to continue outpatient  Code Status:  Full code  Prognosis:   Unable to determine  Discharge Planning:  Home with [place] was discussed with patient and daughters  Thank you for allowing the Palliative Medicine Team to assist in the care of this patient.   Total Time 40 minutes Prolonged Time Billed  no       Greater than 50%  of this time was spent counseling and coordinating care related to the above assessment and plan.  Juel Burrow, DNP, [facility] Palliative Medicine Team Team Phone # [phone]  Pager [phone]

## 2020-06-16 NOTE — Plan of Care (Signed)
  Problem: Pain Managment: Goal: General experience of comfort will improve Outcome: Progressing   

## 2020-06-16 NOTE — Progress Notes (Signed)
Physical Therapy Treatment Patient Details Name: [name] MRN: [id number] DOB: October 24, 1950 Today's Date: 06/16/2020    History of Present Illness Patient is a 70 y.o. female admitted on 06/11/20 presenting with generalized body aches, leg pain and back pain. Patient admitted with cellulitis of RLE and hyperkalemia. Found to have abscess R foot and is s/p I and D on 06/14/20. PMHx significant for seizures, HTN, chronic hepatitis C, ESRD on HD TTS, DM2, ischemic coloitis s/p hemicolectomy and ileostomy, brain aneursym.    PT Comments    Pt making gradual progress.  Focused on EOB balance and partial sit to stands to progress squat pivots and ease caregiver burdens.  Able to progress EOB balance.  Pt continues to have spasms in R leg and trunk that limit balance.   She reports still feels somewhat weak, spasms has increased, and balance is not back to normal, but In regards to level of assist required - pt is very likely near her baseline.  She has declined SNF and has 24 hr support - considering previously required near max A for transfers per eval pt could likely return home with family. Continue to progress as able to reduce caregiver burden.   Follow Up Recommendations  Supervision/Assistance - 24 hour;Home health PT (likely near baseline and declined SNF)     Equipment Recommendations  Other (comment) (has w/c; could consider air mattress)    Recommendations for Other Services       Precautions / Restrictions Precautions Precautions: Fall    Mobility  Bed Mobility Overal bed mobility: Needs Assistance Bed Mobility: Rolling;Supine to Sit;Sit to Supine Rolling: Mod assist   Supine to sit: Max assist Sit to supine: Max assist   General bed mobility comments: max A for trunk and legs for all; had assist of 2 for safety but could be done with assist of 1 due to pt's small size    Transfers Overall transfer level: Needs assistance Equipment used: 2 person hand held  assist Transfers: Sit to/from Stand Sit to Stand: Max assist;+2 physical assistance         General transfer comment: performed 5 x partial sit to stand with focus on rocking onto feet and clearing buttock to improve squat pivots  Ambulation/Gait                 Stairs             Wheelchair Mobility    Modified Rankin (Stroke Patients Only)       Balance Overall balance assessment: Needs assistance Sitting-balance support: Single extremity supported;No upper extremity supported Sitting balance-Leahy Scale: Poor Sitting balance - Comments: R UE or No UE support.  Initially requiring mod-max A but progressed to close guarding with episodes of mod A.  Pt with uncontrolled leans forward - almost like the spasms in her legs requiring mod A to control.  Sat EOB 15 mins and pt improved balance with time.  She was able to work up to several episodes of 30 seconds without assist and correcting mild deviations from baseline     Standing balance-Leahy Scale: Zero                              Cognition Arousal/Alertness: Awake/alert Behavior During Therapy: WFL for tasks assessed/performed Overall Cognitive Status: Within Functional Limits for tasks assessed  Exercises General Exercises - Lower Extremity Ankle Circles/Pumps: PROM;Both;10 reps;Supine Long Arc Quad: AAROM;Both;5 reps;Seated Heel Slides: AAROM;Both;10 reps;Supine Other Exercises Other Exercises: Gentle PF stretch bil and R knee ext stretch: 3 reps 30 sec    General Comments        Pertinent Vitals/Pain Pain Assessment: No/denies pain    Home Living                      Prior Function            PT Goals (current goals can now be found in the care plan section) Acute M[facility]arthy PT Goals Patient Stated Goal: To get stronger. PT Goal Formulation: With patient Time For Goal Achievement: 06/28/20 Potential to Achieve Goals:  Fair Progress towards PT goals: Progressing toward goals    Frequency    Min 2X/week      PT Plan Discharge plan needs to be updated    Co-evaluation              AM-PAC PT "6 Clicks" Mobility   Outcome Measure  Help needed turning from your back to your side while in a flat bed without using bedrails?: Total Help needed moving from lying on your back to sitting on the side of a flat bed without using bedrails?: Total Help needed moving to and from a bed to a chair (including a wheelchair)?: Total Help needed standing up from a chair using your arms (e.g., wheelchair or bedside chair)?: Total Help needed to walk in hospital room?: Total Help needed climbing 3-5 steps with a railing? : Total 6 Click Score: 6    End of Session Equipment Utilized During Treatment: Gait belt Activity Tolerance: Patient tolerated treatment well Patient left: with call bell/phone within reach;in bed;with bed alarm set;Other (comment) (multiple pillows to support) Nurse Communication: Mobility status PT Visit Diagnosis: Other abnormalities of gait and mobility (R26.89);Muscle weakness (generalized) (M62.81)     Time: [id number] PT Time Calculation (min) (ACUTE ONLY): 33 min  Charges:  $Therapeutic Exercise: 8-22 mins $Therapeutic Activity: 8-22 mins                     [name], PT [facility] Pager [phone] [name] [phone]     [name] 06/16/2020, 4:38 PM

## 2020-06-16 NOTE — TOC Progression Note (Signed)
Transition of Care [facility]) - Progression Note    Patient Details  Name: [name] MRN: [id number] Date of Birth: 05/20/1950  Transition of Care [facility]) CM/SW Contact  Sharlet Salina Mila Homer, LCSW Phone Number: 06/16/2020, 2:37 PM  Clinical Narrative:  Visited with patient at the bedside to discuss her discharge disposition and recommendation of ST rehab. [name] was sitting up in bed and was awake, alert, oriented and willing to talk with CSW. Patient advised that PT recommended short-term rehab and when asked, patient responded that she has never been to a skilled facility for ST rehab.  Ms. North indicated that her plan is to discharge home and she will have the support she needs, her granddaughter and her daughters. She added that family can check on her during the day as well as in the evenings. CSW thanked patient for her time and informed her that a nurse case manager will talk with her near discharge regarding [facility] services.    Expected Discharge Plan: [place] Barriers to Discharge: Continued Medical Work up  Expected Discharge Plan and Services Expected Discharge Plan: [place] arrangements for the past 2 months: Single Family Home                                     Social Determinants of Health (SDOH) Interventions  No SDOH interventions requested or needed at this time.   Readmission Risk Interventions No flowsheet data found.

## 2020-06-17 LAB — CBC WITH DIFFERENTIAL/PLATELET
Abs Immature Granulocytes: 0.05 10*3/uL (ref 0.00–0.07)
Basophils Absolute: 0.1 10*3/uL (ref 0.0–0.1)
Basophils Relative: 1 %
Eosinophils Absolute: 0.4 10*3/uL (ref 0.0–0.5)
Eosinophils Relative: 5 %
HCT: 26.4 % — ABNORMAL LOW (ref 36.0–46.0)
Hemoglobin: 8.2 g/dL — ABNORMAL LOW (ref 12.0–15.0)
Immature Granulocytes: 1 %
Lymphocytes Relative: 23 %
Lymphs Abs: 1.8 10*3/uL (ref 0.7–4.0)
MCH: 26.8 pg (ref 26.0–34.0)
MCHC: 31.1 g/dL (ref 30.0–36.0)
MCV: 86.3 fL (ref 80.0–100.0)
Monocytes Absolute: 1.1 10*3/uL — ABNORMAL HIGH (ref 0.1–1.0)
Monocytes Relative: 14 %
Neutro Abs: 4.4 10*3/uL (ref 1.7–7.7)
Neutrophils Relative %: 56 %
Platelets: 320 10*3/uL (ref 150–400)
RBC: 3.06 MIL/uL — ABNORMAL LOW (ref 3.87–5.11)
RDW: 17.4 % — ABNORMAL HIGH (ref 11.5–15.5)
WBC: 7.8 10*3/uL (ref 4.0–10.5)
nRBC: 0 % (ref 0.0–0.2)

## 2020-06-17 LAB — BASIC METABOLIC PANEL
Anion gap: 13 (ref 5–15)
BUN: 25 mg/dL — ABNORMAL HIGH (ref 8–23)
CO2: 26 mmol/L (ref 22–32)
Calcium: 8.9 mg/dL (ref 8.9–10.3)
Chloride: 99 mmol/L (ref 98–111)
Creatinine, Ser: 4.76 mg/dL — ABNORMAL HIGH (ref 0.44–1.00)
GFR, Estimated: 9 mL/min — ABNORMAL LOW (ref >60)
Glucose, Bld: 76 mg/dL (ref 70–99)
Potassium: 3.5 mmol/L (ref 3.5–5.1)
Sodium: 138 mmol/L (ref 135–145)

## 2020-06-17 LAB — MAGNESIUM: Magnesium: 2.1 mg/dL (ref 1.7–2.4)

## 2020-06-17 LAB — PHOSPHORUS: Phosphorus: 3.6 mg/dL (ref 2.5–4.6)

## 2020-06-17 MED ORDER — OXYCODONE-ACETAMINOPHEN 10-325 MG PO TABS: 1.0000 | ORAL_TABLET | Freq: Four times a day (QID) | ORAL | 0 refills | Status: AC | PRN

## 2020-06-17 MED ORDER — OXYCODONE-ACETAMINOPHEN 5-325 MG PO TABS
ORAL_TABLET | ORAL | Status: AC
  Filled 2020-06-17: qty 1

## 2020-06-17 MED ORDER — DOXYCYCLINE HYCLATE 100 MG PO TABS: 100.0000 mg | ORAL_TABLET | Freq: Two times a day (BID) | ORAL | 0 refills | Status: AC

## 2020-06-17 NOTE — Progress Notes (Signed)
[facility] Progress Note   Subjective:   Pt seen in room this AM. Palliative care notes reviewed, pt wishes to continue HD and return home at discharge. She mentions she assistance feeding herself but tells me her family assists her with all meals. Feeling well today, denies SOB, CP, palpitations, dizziness, abdominal pain and nausea.   Objective Vitals:   06/16/20 0823 06/16/20 1700 06/16/20 1953 06/17/20 0427  BP: (!) 122/57 122/60 129/62 128/63  Pulse: 98 98 94 89  Resp: '18 18 18 16  '$ Temp: 98.6 F (37 C) 98.7 F (37.1 C) 98.7 F (37.1 C) 98.4 F (36.9 C)  TempSrc: Oral Oral Oral Oral  SpO2: 96% 98% 97% 98%  Weight:      Height:       Physical Exam General: WDWN elderly female, alert and in NAD Heart: RRR, 3/6 systolic murmur Lungs: CTA bilaterally without wheezing, rhonchi or rales. Unlabored on RA Abdomen: Soft, non-tender, non-distended, +BS Extremities: No edema b/l lower extremities Dialysis Access:  RUE AVG + bruit  Additional Objective Labs: Basic Metabolic Panel: Recent Labs  Lab 06/15/20 0411 06/16/20 0341 06/17/20 0217  NA 140 137 138  K 3.7 3.5 3.5  CL 101 98 99  CO2 '26 25 26  '$ GLUCOSE 66* 87 76  BUN 24* 17 25*  CREATININE 4.84* 3.06* 4.76*  CALCIUM 9.1 8.9 8.9  PHOS 4.4 2.4* 3.6   Liver Function Tests: Recent Labs  Lab 06/11/20 1447  AST 29  ALT 8  ALKPHOS 52  BILITOT 1.8*  PROT 8.2*  ALBUMIN 3.4*   CBC: Recent Labs  Lab 06/13/20 0310 06/14/20 0723 06/15/20 0411 06/16/20 0341 06/17/20 0217  WBC 7.1 7.0 8.2 9.6 7.8  NEUTROABS  --  4.1 4.8 6.7 4.4  HGB 8.1* 9.0* 8.3* 8.3* 8.2*  HCT 24.5* 27.3* 26.4* 26.4* 26.4*  MCV 83.6 83.0 85.2 86.3 86.3  PLT 377 342 344 258 320   Blood Culture    Component Value Date/Time   SDES ABSCESS FOOT RIGHT 06/14/2020 1812   SPECREQUEST NONE 06/14/2020 1812   CULT  06/14/2020 1812    NO GROWTH 2 DAYS Performed at [facility], [address], [place], [place] [address]     REPTSTATUS PENDING 06/14/2020 1812   CBG: Recent Labs  Lab 06/11/20 1836 06/12/20 1217 06/14/20 1836  GLUCAP 96 70 65*   Medications: . sodium chloride 10 mL/hr at 06/14/20 1753   . Chlorhexidine Gluconate Cloth  6 each Topical Q0600  . Chlorhexidine Gluconate Cloth  6 each Topical Q0600  . [START ON 06/20/2020] darbepoetin (ARANESP) injection - DIALYSIS  60 mcg Intravenous Q Tue-HD  . doxycycline  100 mg Oral Q12H  . feeding supplement (NEPRO CARB STEADY)  237 mL Oral TID BM  . heparin  5,000 Units Subcutaneous Q8H  . multivitamin  1 tablet Oral QHS  . neomycin-bacitracin-polymyxin  1 application Topical BID  . senna-docusate  1 tablet Oral BID    Outpatient Dialysis Orders: Center:[facility]. [id number] kgHD Bath 2K, 2.5 CA time Heparin none, RUA AVGG  Mircera 85mg every 2 weeksIlast given 05/29/2020   Assessment/Plan: 1. Right footabscess/cellulitis/wound-ABX per admit team, work-up per admit/podiatry I&D 3/30 for the abscess MRI no osteomyelitis 2. Hyperkalemia- secondary to missed hemodialysis, now resolved 3. ESRD -Continue HD on TTS schedule, planned for HD today.  4. Hypertension/volume -BP stable, euvolemic on exam. Below outpatient EDW with ongoing weight loss noted. Continue UF with HD as tolerated 5. Anemiaof ESRD-Hgb trending  down, aranesp 63mg given on Thursday 6. Metabolic bone disease -calcium stable,phos slightly low, not on phosphorus binder. On regular diet 7. Chronic arthritic pain: on Percocet every 4 hours 8. COPD chronic tobacco use advised patient to quit 9. Nutrition -Albumin 3.4, receiving renal vitamin and supplement. Noted weight loss lately, see palliative care notes 10. Goals of care-wishes to continue HD at this time 11. Left leg myoclonic movement-appears to be resolved at this time   SAnice Paganini PA-C 06/17/2020, 8:47 AM  [facility] Pager: [phone]

## 2020-06-17 NOTE — Discharge Instructions (Signed)
Continue dressing changes per wound care instructions. Follow-up with podiatry in 1 week

## 2020-06-17 NOTE — Progress Notes (Signed)
Orthopedic Tech Progress Note Patient Details:  [name] September 13, 1950 [id number] Left at bedside for patient Ortho Devices Type of Ortho Device: Postop shoe/boot Ortho Device/Splint Interventions: Ordered       [name] A [name] 06/17/2020, 2:09 PM

## 2020-06-17 NOTE — Discharge Summary (Signed)
Physician Discharge Summary   [name] [id number] DOB: [birth year] DOA: 06/11/2020  PCP: Janie Morning, DO  Admit date: 06/11/2020 Discharge date:  06/17/2020  Admitted From: home Disposition:  home Discharging physician: Dwyane Dee, MD  Recommendations for Outpatient Follow-up:  1. Continue following with palliative care.  Ongoing goals of care discussions with family and patient. 2. Follow-up with podiatry after 1 week   Patient discharged to home in Discharge Condition: stable Risk of unplanned readmission score: Unplanned Admission- Pilot do not use: 23.8  CODE STATUS: Full Diet recommendation:  Diet Orders (From admission, onward)    Start     Ordered   06/14/20 1958  Diet regular Room service appropriate? Yes; Fluid consistency: Thin  Diet effective now       Question Answer Comment  Room service appropriate? Yes   Fluid consistency: Thin      06/14/20 1957          Hospital Course: [name] is a 70 yo female with PMH ESRD on HD TTS (misses sessions due to pain), HTN, DMII, arthritis, mild AS, chronic Hep C who presented to the ER with generalized body aches, right foot pain, and her chronic ongoing neck and lower back pain. Work-up was significant for hyperkalemia due to missed dialysis sessions.  She was also noted to have drainage and a foot wound on the dorsal aspect of her right foot.  Family showed a picture from home of the swelling on her foot with purulent drainage.  She was admitted for further work-up/evaluation and IV antibiotics.  Of note, she has been continuously losing weight despite "good appetite" per patient and family. There have also been discussions outpatient regarding palliative care, however no consideration for hospice has been made at this time.  Due to her ongoing decline, family is amenable for meeting with palliative care during this hospitalization for further discussions.  She was evaluated by podiatry after MRI showed  underlying abscess.  She underwent I&D on 06/14/2020.  Right foot cellulitis with abscess -  MRI foot shows multiloculated fluid collection measuring 4.8 x 4.7 x 1.6 cm  -Discontinue vancomycin and Rocephin; de-escalate down to doxycycline to complete course; cultures remain negative -Bilateral lower extremity duplex negative for DVT performed on 06/11/2020 - appreciate podiatry evaluation; underwent I&D on 06/14/20 -Discharged on doxycycline to complete course.  Patient will follow up with podiatry after approximately 1 week  Hyperkalemia. ESRD on HD TTS Appreciate nephrology assistance. Continue HD per nephrology. Patient received Lokelma in the ER.  HTN Blood pressure stable.  Anemia of chronic kidney disease Hemoglobin stable. Monitor.  Chronic hep C Monitor.  Constipation X-ray abdomen Continue bowel regimen  Active smoker Counseled to quit. Monitor.  Goals of care conversation -Palliative care meeting with family on 06/16/2020.  Patient remains full code with ongoing full scope of treatment and ongoing dialysis. -Patient does elect for continuing with outpatient palliative care  Severe protein calorie malnutrition. - Patient's BMI is Body mass index is 14.45 kg/m.. - Patient has the following signs/symptoms consistent with PCM: (fat loss, muscle loss, muscle wasting, cachexia). - seen by RD, appreciate assistance. Continue plan per RD to include nepro shakes, magic cups  Stage II sacral medial pressure ulcer, POA. Continue foam dressing. Pain control.   The patient's chronic medical conditions were treated accordingly per the patient's home medication regimen except as noted.  On day of discharge, patient was felt deemed stable for discharge. Patient/family member advised to call PCP or come back  to ER if needed.   Principal Diagnosis: Abscess of right foot  Discharge Diagnoses: Active Hospital Problems   Diagnosis Date Noted  . Abscess of right foot  06/13/2020    Priority: High  . Protein-calorie malnutrition, severe 06/12/2020  . ESRD (end stage renal disease) ([place]) 01/30/2011    Resolved Hospital Problems  No resolved problems to display.    Discharge Instructions    Discharge wound care:   Complete by: As directed    Apply Xeroform over incision site.  Cover with 4 x 4 gauze, Kerlix, and Ace wrap.   Increase activity slowly   Complete by: As directed      Allergies as of 06/17/2020      Reactions   Iodinated Diagnostic Agents Other (See Comments)   Seziure   Hydrocodone Nausea And Vomiting   Metoprolol Nausea And Vomiting   Iohexol Other (See Comments)   Reaction is convulsions      Medication List    STOP taking these medications   amoxicillin 500 MG capsule Commonly known as: AMOXIL     TAKE these medications   acetaminophen 325 MG tablet Commonly known as: TYLENOL Take 2 tablets (650 mg total) by mouth every 6 (six) hours as needed for mild pain (or Fever >/= 101).   doxycycline 100 MG tablet Commonly known as: VIBRA-TABS Take 1 tablet (100 mg total) by mouth 2 (two) times daily for 3 days. Start taking on: June 18, 2020   guaiFENesin 600 MG 12 hr tablet Commonly known as: MUCINEX Take 600 mg by mouth 2 (two) times daily as needed for cough.   guaiFENesin 100 MG/5ML liquid Commonly known as: ROBITUSSIN Take 200 mg by mouth 3 (three) times daily as needed for cough.   neomycin-bacitracin-polymyxin ointment Commonly known as: NEOSPORIN Apply 1 application topically 2 (two) times daily. Butt sores   oxyCODONE-acetaminophen 10-325 MG tablet Commonly known as: PERCOCET Take 1 tablet by mouth every 6 (six) hours as needed for up to 5 days. What changed:   how much to take  when to take this            Discharge Care Instructions  (From admission, onward)         Start     Ordered   06/17/20 0000  Discharge wound care:       Comments: Apply Xeroform over incision site.  Cover with 4 x 4  gauze, Kerlix, and Ace wrap.   06/17/20 1144          Follow-up Information    Edrick Kins, DPM. Schedule an appointment as soon as possible for a visit in 1 week(s).   Specialty: Podiatry Contact information: [address] [place] [address] [phone]              Allergies  Allergen Reactions  . Iodinated Diagnostic Agents Other (See Comments)    Seziure   . Hydrocodone Nausea And Vomiting  . Metoprolol Nausea And Vomiting  . Iohexol Other (See Comments)    Reaction is convulsions    Consultations: Podiatry Palliative care  Discharge Exam: BP (!) 154/85   Pulse 92   Temp 98.4 F (36.9 C) (Oral)   Resp 13   Ht '5\' 5"'$  (1.651 m)   Wt 39.8 kg   SpO2 96%   BMI 14.60 kg/m  General appearance: Frail and cachectic pleasant elderly woman resting in bed in no distress Head: Normocephalic, without obvious abnormality, atraumatic Eyes: EOMI Back:  Stage II sacral pressure ulcer noted Lungs: clear to auscultation bilaterally Heart: regular rate and rhythm and S1, S2 normal Abdomen: normal findings: bowel sounds normal and soft, non-tender Extremities: Right lower extremity now wrapped in dressing Skin: mobility and turgor normal Neurologic: Left hand partially contracted.  Left upper extremity flexed and partially contracted  The results of significant diagnostics from this hospitalization (including imaging, microbiology, ancillary and laboratory) are listed below for reference.   Microbiology: Recent Results (from the past 240 hour(s))  Blood Culture (routine x 2)     Status: None   Collection Time: 06/11/20  2:47 PM   Specimen: BLOOD LEFT FOREARM  Result Value Ref Range Status   Specimen Description   Final    BLOOD LEFT FOREARM Performed at [facility] [address] [place], [place] [address]    Special Requests   Final    BOTTLES DRAWN AEROBIC AND ANAEROBIC Blood Culture adequate volume Performed at [facility] [address] [place], [place] [address]    Culture   Final    NO GROWTH 5 DAYS Performed at [facility], [place] [address], [place], [place] [address]    Report Status 06/16/2020 FINAL  Final  Blood Culture (routine x 2)     Status: None   Collection Time: 06/11/20  8:45 PM   Specimen: BLOOD LEFT HAND  Result Value Ref Range Status   Specimen Description BLOOD LEFT HAND  Final   Special Requests   Final    BOTTLES DRAWN AEROBIC ONLY Blood Culture results may not be optimal due to an inadequate volume of blood received in culture bottles   Culture   Final    NO GROWTH 5 DAYS Performed at [facility], [place] [address] [place], [place] [address]    Report Status 06/16/2020 FINAL  Final  Aerobic/Anaerobic Culture w Gram Stain (surgical/deep wound)     Status: None (Preliminary result)   Collection Time: 06/14/20  6:12 PM   Specimen: Soft Tissue, Other  Result Value Ref Range Status   Specimen Description ABSCESS FOOT RIGHT  Final   Special Requests NONE  Final   Gram Stain   Final    FEW WBC PRESENT, PREDOMINANTLY PMN NO ORGANISMS SEEN    Culture   Final    NO GROWTH 3 DAYS NO ANAEROBES ISOLATED; CULTURE IN PROGRESS FOR 5 DAYS Performed at [facility], [place] [address] [place], [place] [address]    Report Status PENDING  Incomplete     Labs: BNP (last 3 results) Recent Labs    05/25/20 1605  BNP [id number]*   Basic Metabolic Panel: Recent Labs  Lab 06/13/20 0310 06/14/20 0723 06/15/20 0411 06/16/20 0341 06/17/20 0217  NA 139 140 140 137 138  K 3.7 3.5 3.7 3.5 3.5  CL 97* 101 101 98 99  CO2 '28 26 26 25 26  '$ GLUCOSE 94 79 66* 87 76  BUN 32* 15 24* 17 25*  CREATININE 5.52* 3.55* 4.84* 3.06* 4.76*  CALCIUM 8.6* 9.4 9.1 8.9 8.9  MG  --  2.1 2.2 2.1 2.1  PHOS  --   --  4.4 2.4* 3.6   Liver Function Tests: Recent Labs  Lab 06/11/20 1447  AST 29  ALT 8  ALKPHOS 52  BILITOT 1.8*  PROT 8.2*  ALBUMIN 3.4*   No results for  input(s): LIPASE, AMYLASE in the last 168 hours. No results for input(s): AMMONIA in the last  168 hours. CBC: Recent Labs  Lab 06/11/20 1447 06/12/20 0315 06/13/20 0310 06/14/20 0723 06/15/20 0411 06/16/20 0341 06/17/20 0217  WBC 7.9   < > 7.1 7.0 8.2 9.6 7.8  NEUTROABS 5.1  --   --  4.1 4.8 6.7 4.4  HGB 9.5*   < > 8.1* 9.0* 8.3* 8.3* 8.2*  HCT 30.1*   < > 24.5* 27.3* 26.4* 26.4* 26.4*  MCV 86.0   < > 83.6 83.0 85.2 86.3 86.3  PLT 394   < > 377 342 344 258 320   < > = values in this interval not displayed.   Cardiac Enzymes: No results for input(s): CKTOTAL, CKMB, CKMBINDEX, TROPONINI in the last 168 hours. BNP: Invalid input(s): POCBNP CBG: Recent Labs  Lab 06/11/20 1836 06/12/20 1217 06/14/20 1836  GLUCAP 96 70 65*   D-Dimer No results for input(s): DDIMER in the last 72 hours. Hgb A1c No results for input(s): HGBA1C in the last 72 hours. Lipid Profile No results for input(s): CHOL, HDL, LDLCALC, TRIG, CHOLHDL, LDLDIRECT in the last 72 hours. Thyroid function studies No results for input(s): TSH, T4TOTAL, T3FREE, THYROIDAB in the last 72 hours.  Invalid input(s): FREET3 Anemia work up No results for input(s): VITAMINB12, FOLATE, FERRITIN, TIBC, IRON, RETICCTPCT in the last 72 hours. Urinalysis No results found for: COLORURINE, APPEARANCEUR, [place], [place], [place], [place], [place], [place], PROTEINUR, UROBILINOGEN, NITRITE, LEUKOCYTESUR Sepsis Labs Invalid input(s): PROCALCITONIN,  WBC,  LACTICIDVEN Microbiology Recent Results (from the past 240 hour(s))  Blood Culture (routine x 2)     Status: None   Collection Time: 06/11/20  2:47 PM   Specimen: BLOOD LEFT FOREARM  Result Value Ref Range Status   Specimen Description   Final    BLOOD LEFT FOREARM Performed at [facility] [address], [place], [place] [address]    Special Requests   Final    BOTTLES DRAWN AEROBIC AND ANAEROBIC Blood Culture adequate volume Performed at  [facility] [address], [place], [place] [address]    Culture   Final    NO GROWTH 5 DAYS Performed at [facility], [place] [address] [place], [place] [address]    Report Status 06/16/2020 FINAL  Final  Blood Culture (routine x 2)     Status: None   Collection Time: 06/11/20  8:45 PM   Specimen: BLOOD LEFT HAND  Result Value Ref Range Status   Specimen Description BLOOD LEFT HAND  Final   Special Requests   Final    BOTTLES DRAWN AEROBIC ONLY Blood Culture results may not be optimal due to an inadequate volume of blood received in culture bottles   Culture   Final    NO GROWTH 5 DAYS Performed at [facility], [place] [address] [place], [place] [address]    Report Status 06/16/2020 FINAL  Final  Aerobic/Anaerobic Culture w Gram Stain (surgical/deep wound)     Status: None (Preliminary result)   Collection Time: 06/14/20  6:12 PM   Specimen: Soft Tissue, Other  Result Value Ref Range Status   Specimen Description ABSCESS FOOT RIGHT  Final   Special Requests NONE  Final   Gram Stain   Final    FEW WBC PRESENT, PREDOMINANTLY PMN NO ORGANISMS SEEN    Culture   Final    NO GROWTH 3 DAYS NO ANAEROBES ISOLATED; CULTURE IN PROGRESS FOR 5 DAYS Performed at [facility], [place] [address] [place],  [address]  Report Status PENDING  Incomplete    Procedures/Studies: DG Chest 2 View  Result Date: 05/25/2020 CLINICAL DATA:  Cough and shortness of breath. EXAM: CHEST - 2 VIEW COMPARISON:  10/25/2019 FINDINGS: Chronic hyperinflation. Persistent but improved bronchial thickening from prior. No acute or confluent airspace disease. Stable heart size and mediastinal contours. Aortic atherosclerosis. Surgical clips project in the posterior mid right hemithorax. Right axillary vascular stent. No acute osseous abnormalities are seen. IMPRESSION: Chronic hyperinflation without acute abnormality. Electronically Signed   By: Keith Rake M.D.   On:  05/25/2020 16:13   DG Pelvis Portable  Result Date: 06/12/2020 CLINICAL DATA:  Pelvic pain EXAM: PORTABLE PELVIS 1-2 VIEWS COMPARISON:  None. FINDINGS: Pelvic ring is intact. No acute fracture or dislocation is noted. Postsurgical changes in the lower lumbar spine noted. No soft tissue abnormality is seen. IMPRESSION: No acute abnormality noted. Electronically Signed   By: Inez Catalina M.D.   On: 06/12/2020 19:29   MR FOOT RIGHT WO CONTRAST  Result Date: 06/13/2020 CLINICAL DATA:  Foot swelling, redness, drainage from the wound EXAM: MRI OF THE RIGHT FOREFOOT WITHOUT CONTRAST TECHNIQUE: Multiplanar, multisequence MR imaging of the right foot was performed. No intravenous contrast was administered. COMPARISON:  None. FINDINGS: Bones/Joint/Cartilage No marrow signal abnormality. No fracture or dislocation. Normal alignment. No joint effusion. Ligaments Collateral ligaments are intact.  Lisfranc ligament is intact. Muscles and Tendons Flexor, peroneal and extensor compartment tendons are intact. Small amount of fluid in the flexor hallucis longus tendon sheath extending beyond the field of view. Muscles are normal. Soft tissue Complex multiloculated fluid collection along the dorsal aspect of the foot measuring 4.8 x 4.7 x 1.6 cm at the level of the metatarsals. Surrounding soft tissue edema concerning for cellulitis of the dorsal aspect of the foot IMPRESSION: 1. Complex multiloculated fluid collection along the dorsal aspect of the foot at the level of the metatarsals measuring 4.8 x 4.7 x 1.6 cm concerning for an abscess with cellulitis of the dorsal aspect of the foot. 2. No osteomyelitis of the right forefoot. Electronically Signed   By: Kathreen Devoid   On: 06/13/2020 12:24   DG CHEST PORT 1 VIEW  Result Date: 06/12/2020 CLINICAL DATA:  Chest pain EXAM: PORTABLE CHEST 1 VIEW COMPARISON:  05/25/2020 FINDINGS: Cardiac shadow is stable. Aortic calcifications are again seen. The lungs are well aerated  bilaterally. Mild vascular prominence is noted likely related to fluid overload from end-stage renal disease. Additionally some patchy atelectatic changes are noted in the right upper lobe. Early infiltrate could not be totally excluded. No bony abnormality is noted. IMPRESSION: Mild vascular prominence consistent with fluid overload. Patchy opacity in the right upper lobe which may represent acute infiltrate. Electronically Signed   By: Inez Catalina M.D.   On: 06/12/2020 19:27   DG Abd Portable 1V  Result Date: 06/13/2020 CLINICAL DATA:  No bowel movement EXAM: PORTABLE ABDOMEN - 1 VIEW COMPARISON:  06/12/2020 FINDINGS: Surgical hardware in the lumbar spine. Nonobstructed bowel-gas pattern. No substantial stool burden. No radiopaque calculi. IMPRESSION: Nonobstructed bowel-gas pattern. Electronically Signed   By: Donavan Foil M.D.   On: 06/13/2020 19:53   DG Abd Portable 1V  Result Date: 06/12/2020 CLINICAL DATA:  Abdominal pain EXAM: PORTABLE ABDOMEN - 1 VIEW COMPARISON:  None. FINDINGS: Scattered large and small bowel gas is noted. No abnormal mass or abnormal calcifications are seen. No free air is seen. Postsurgical changes are noted in the lumbar spine as well as in the right  abdomen. No acute bony abnormality is seen. IMPRESSION: No acute abnormality noted. Electronically Signed   By: Inez Catalina M.D.   On: 06/12/2020 19:28   DG Foot Complete Right  Result Date: 06/11/2020 CLINICAL DATA:  RIGHT foot wound, lower extremity swelling for 1 week, decreased sensation in RIGHT foot, history diabetes mellitus EXAM: RIGHT FOOT COMPLETE - 3+ VIEW COMPARISON:  11/03/2011 FINDINGS: Marked osseous demineralization. Joint spaces preserved. Scattered small vessel vascular calcification and soft tissue swelling. No acute fracture, dislocation, or bone destruction. IMPRESSION: Marked osseous demineralization and soft tissue swelling without acute bony findings. Extensive small vessel vascular calcification.  Electronically Signed   By: Lavonia Dana M.D.   On: 06/11/2020 15:30   VAS [place] LOWER EXTREMITY VENOUS (DVT) (ONLY [facility] & [facility] 7a-7p)  Result Date: 06/13/2020  Lower Venous DVT Study Indications: Edema.  Comparison Study: no prior Performing Technologist: Abram Sander RVS  Examination Guidelines: A complete evaluation includes B-mode imaging, spectral Doppler, color Doppler, and power Doppler as needed of all accessible portions of each vessel. Bilateral testing is considered an integral part of a complete examination. Limited examinations for reoccurring indications may be performed as noted. The reflux portion of the exam is performed with the patient in reverse Trendelenburg.  +---------+---------------+---------+-----------+----------+--------------+ RIGHT    CompressibilityPhasicitySpontaneityPropertiesThrombus Aging +---------+---------------+---------+-----------+----------+--------------+ CFV      Full           Yes      Yes                                 +---------+---------------+---------+-----------+----------+--------------+ SFJ      Full                                                        +---------+---------------+---------+-----------+----------+--------------+ FV Prox  Full                                                        +---------+---------------+---------+-----------+----------+--------------+ FV Mid   Full                                                        +---------+---------------+---------+-----------+----------+--------------+ FV DistalFull                                                        +---------+---------------+---------+-----------+----------+--------------+ PFV      Full                                                        +---------+---------------+---------+-----------+----------+--------------+ POP      Full  Yes      Yes                                  +---------+---------------+---------+-----------+----------+--------------+ PTV      Full                                                        +---------+---------------+---------+-----------+----------+--------------+ PERO     Full                                                        +---------+---------------+---------+-----------+----------+--------------+   +----+---------------+---------+-----------+----------+--------------+ LEFTCompressibilityPhasicitySpontaneityPropertiesThrombus Aging +----+---------------+---------+-----------+----------+--------------+ CFV Full           Yes      Yes                                 +----+---------------+---------+-----------+----------+--------------+     Summary: RIGHT: - There is no evidence of deep vein thrombosis in the lower extremity.  - No cystic structure found in the popliteal fossa.  LEFT: - No evidence of common femoral vein obstruction.  *See table(s) above for measurements and observations. Electronically signed by Ruta Hinds MD on 06/13/2020 at 3:10:30 PM.    Final      Time coordinating discharge: Over 90 minutes    Dwyane Dee, MD  [facility] 06/17/2020, 4:03 PM

## 2020-06-17 NOTE — Progress Notes (Signed)
DISCHARGE NOTE HOME [name] to be discharged Home per MD order. Discussed prescriptions and follow up appointments with the patient. Prescriptions given to patient; medication list explained in detail. Patient verbalized understanding.  Skin clean, dry and intact without evidence of skin break down, no evidence of skin tears noted. IV catheter discontinued intact. Site without signs and symptoms of complications. Dressing and pressure applied. Pt denies pain at the site currently. No complaints noted.  Patient free of lines, drains, and wounds.   An After Visit Summary (AVS) was printed and given to the patient. Patient escorted via wheelchair, and discharged home via private auto.  Arlyss Repress, RN

## 2020-06-17 NOTE — Plan of Care (Signed)
  Problem: Safety: Goal: Ability to remain free from injury will improve Outcome: Progressing   

## 2020-06-17 NOTE — Plan of Care (Signed)
  Problem: Pain Managment: Goal: General experience of comfort will improve Outcome: Progressing   Problem: Safety: Goal: Ability to remain free from injury will improve Outcome: Progressing   Problem: Pain Managment: Goal: General experience of comfort will improve Outcome: Progressing   Problem: Safety: Goal: Ability to remain free from injury will improve Outcome: Progressing   Problem: Skin Integrity: Goal: Risk for impaired skin integrity will decrease Outcome: Progressing

## 2020-06-19 LAB — AEROBIC/ANAEROBIC CULTURE W GRAM STAIN (SURGICAL/DEEP WOUND): Culture: NO GROWTH

## 2020-06-20 DIAGNOSIS — Z992 Dependence on renal dialysis: Secondary | ICD-10-CM | POA: Diagnosis not present

## 2020-06-20 DIAGNOSIS — N186 End stage renal disease: Secondary | ICD-10-CM | POA: Diagnosis not present

## 2020-06-20 DIAGNOSIS — N2581 Secondary hyperparathyroidism of renal origin: Secondary | ICD-10-CM | POA: Diagnosis not present

## 2020-06-24 DIAGNOSIS — N2581 Secondary hyperparathyroidism of renal origin: Secondary | ICD-10-CM | POA: Diagnosis not present

## 2020-06-24 DIAGNOSIS — N186 End stage renal disease: Secondary | ICD-10-CM | POA: Diagnosis not present

## 2020-06-24 DIAGNOSIS — Z992 Dependence on renal dialysis: Secondary | ICD-10-CM | POA: Diagnosis not present

## 2020-06-26 ENCOUNTER — Ambulatory Visit (INDEPENDENT_AMBULATORY_CARE_PROVIDER_SITE_OTHER): Payer: Medicare PPO | Admitting: Podiatry

## 2020-06-26 ENCOUNTER — Other Ambulatory Visit: Payer: Self-pay

## 2020-06-26 DIAGNOSIS — Z9889 Other specified postprocedural states: Secondary | ICD-10-CM

## 2020-06-26 NOTE — Progress Notes (Signed)
   Subjective:  Patient presents today status post incision and drainage right foot. DOS: 06/14/2021.  Patient states that she is doing well.  She presents today with her daughter, Elmyra Ricks.  No new complaints at this time  Past Medical History:  Diagnosis Date  . Anemia    anemia of chronic disease likely 2/2 ESRD per last anemia panel (02/2011) with Fe 35, TIBC 167, ferritin 2041  // BL Hgb 8-10  . Anuria    Due to dialysis  . Aortic stenosis, mild    03/2011 echo  . Arthritis    Back  . Blood transfusion 1990's   r/t Kidney removal surgery  . Brain aneurysm    No records could be found  . Colitis, ischemic ([place]) 01/2011   S/P partial colectomy of right hemicolon and ileostomy placement  . Diabetes mellitus    Borderline  . Dialysis patient [facility])    [place] Tues,Thursday, saturday  . Dysrhythmia    SVT for brief period in Feb 2013  . End stage renal disease on dialysis [facility])    Secondary to hypertension // T/Th/Sat dialysis on [employer]  . Foot drop, bilateral    after back surgery  . Heart murmur    Born with heart mumur, does not require follow up per pt  . Hepatitis C   . Hypertension    Does not see a heart doctor, had pre transplant stress test at [facility]    . Lumbar spinal stenosis     bilateral L4-5 and L5-S1 posterior lumbar interbody arthrodesis and bilateral L4-S1 posterior lateral arthrodesis for lumbar stenosis dynamic lumbar spondylolisthesis lumbar spondylosis lumbar radiculopathy with foot drop // s/p surgery 01/2011 - see surgery section for details.  . Seizures ([place])    r/t HTN in 1990's x 1  . Umbilical hernia age 70      Objective/Physical Exam Neurovascular status intact.  Skin incisions appear to be well coapted with  staples intact. No sign of infectious process noted. No dehiscence. No active bleeding noted. Moderate edema noted to the surgical extremity..  Assessment: 1. s/p incision and drainage right foot. DOS: 06/14/2020   Plan  of Care:  1. Patient was evaluated.  2.  Patient may begin washing and getting the foot wet.  Recommend Betadine ointment and a light dressing daily.  Betadine ointment provided 3.  Recommend Ace wrap daily.  Ace wrap applied today 4.  New postsurgical shoes dispensed today.  Wear daily 5.  Return to clinic in 1 week for staple removal   Edrick Kins, DPM [facility]  Dr. Edrick Kins, DPM    [address], [place] [address]                Office [phone]  Fax [phone]

## 2020-06-27 DIAGNOSIS — N2581 Secondary hyperparathyroidism of renal origin: Secondary | ICD-10-CM | POA: Diagnosis not present

## 2020-06-27 DIAGNOSIS — N186 End stage renal disease: Secondary | ICD-10-CM | POA: Diagnosis not present

## 2020-06-27 DIAGNOSIS — Z992 Dependence on renal dialysis: Secondary | ICD-10-CM | POA: Diagnosis not present

## 2020-07-03 ENCOUNTER — Ambulatory Visit: Payer: Medicare PPO | Admitting: Podiatry

## 2020-07-04 DIAGNOSIS — E119 Type 2 diabetes mellitus without complications: Secondary | ICD-10-CM | POA: Diagnosis not present

## 2020-07-04 DIAGNOSIS — B182 Chronic viral hepatitis C: Secondary | ICD-10-CM | POA: Diagnosis not present

## 2020-07-04 DIAGNOSIS — N186 End stage renal disease: Secondary | ICD-10-CM | POA: Diagnosis not present

## 2020-07-04 DIAGNOSIS — J120 Adenoviral pneumonia: Secondary | ICD-10-CM | POA: Diagnosis not present

## 2020-07-05 ENCOUNTER — Other Ambulatory Visit: Payer: Self-pay

## 2020-07-05 ENCOUNTER — Other Ambulatory Visit: Payer: Medicare PPO | Admitting: Hospice

## 2020-07-15 LAB — FUNGUS CULTURE WITH STAIN

## 2020-07-15 LAB — FUNGAL ORGANISM REFLEX

## 2020-07-15 LAB — FUNGUS CULTURE RESULT

## 2022-01-14 ENCOUNTER — Encounter (INDEPENDENT_AMBULATORY_CARE_PROVIDER_SITE_OTHER): Payer: Self-pay
